# Patient Record
Sex: Male | Born: 1965 | Race: White | Hispanic: No | Marital: Married | Smoking: Never smoker
Health system: Southern US, Community
[De-identification: ages and names within clinical notes are randomized; demographics above are authoritative.]

## PROBLEM LIST (undated history)

## (undated) ENCOUNTER — Inpatient Hospital Stay: Admission: EM | Payer: Self-pay | Source: Home / Self Care

## (undated) DIAGNOSIS — R0600 Dyspnea, unspecified: Secondary | ICD-10-CM

## (undated) DIAGNOSIS — N189 Chronic kidney disease, unspecified: Secondary | ICD-10-CM

## (undated) DIAGNOSIS — R569 Unspecified convulsions: Secondary | ICD-10-CM

## (undated) DIAGNOSIS — R233 Spontaneous ecchymoses: Secondary | ICD-10-CM

## (undated) DIAGNOSIS — G43909 Migraine, unspecified, not intractable, without status migrainosus: Secondary | ICD-10-CM

## (undated) DIAGNOSIS — I82409 Acute embolism and thrombosis of unspecified deep veins of unspecified lower extremity: Secondary | ICD-10-CM

## (undated) DIAGNOSIS — Z8616 Personal history of COVID-19: Secondary | ICD-10-CM

## (undated) DIAGNOSIS — K219 Gastro-esophageal reflux disease without esophagitis: Secondary | ICD-10-CM

## (undated) DIAGNOSIS — D649 Anemia, unspecified: Secondary | ICD-10-CM

## (undated) DIAGNOSIS — J40 Bronchitis, not specified as acute or chronic: Secondary | ICD-10-CM

## (undated) DIAGNOSIS — K5900 Constipation, unspecified: Secondary | ICD-10-CM

## (undated) DIAGNOSIS — E662 Morbid (severe) obesity with alveolar hypoventilation: Secondary | ICD-10-CM

## (undated) DIAGNOSIS — G709 Myoneural disorder, unspecified: Secondary | ICD-10-CM

## (undated) DIAGNOSIS — N186 End stage renal disease: Secondary | ICD-10-CM

## (undated) DIAGNOSIS — H9190 Unspecified hearing loss, unspecified ear: Secondary | ICD-10-CM

## (undated) DIAGNOSIS — Z992 Dependence on renal dialysis: Secondary | ICD-10-CM

## (undated) DIAGNOSIS — G4733 Obstructive sleep apnea (adult) (pediatric): Secondary | ICD-10-CM

## (undated) DIAGNOSIS — M1990 Unspecified osteoarthritis, unspecified site: Secondary | ICD-10-CM

## (undated) DIAGNOSIS — I2699 Other pulmonary embolism without acute cor pulmonale: Secondary | ICD-10-CM

## (undated) DIAGNOSIS — F319 Bipolar disorder, unspecified: Secondary | ICD-10-CM

## (undated) DIAGNOSIS — F99 Mental disorder, not otherwise specified: Secondary | ICD-10-CM

## (undated) DIAGNOSIS — E1140 Type 2 diabetes mellitus with diabetic neuropathy, unspecified: Secondary | ICD-10-CM

## (undated) DIAGNOSIS — J9611 Chronic respiratory failure with hypoxia: Secondary | ICD-10-CM

## (undated) DIAGNOSIS — R238 Other skin changes: Secondary | ICD-10-CM

## (undated) DIAGNOSIS — E119 Type 2 diabetes mellitus without complications: Secondary | ICD-10-CM

## (undated) DIAGNOSIS — G894 Chronic pain syndrome: Secondary | ICD-10-CM

## (undated) DIAGNOSIS — D696 Thrombocytopenia, unspecified: Secondary | ICD-10-CM

## (undated) DIAGNOSIS — F419 Anxiety disorder, unspecified: Secondary | ICD-10-CM

## (undated) HISTORY — PX: OTHER SURGICAL HISTORY: SHX169

## (undated) HISTORY — DX: Dyspnea, unspecified: R06.00

## (undated) HISTORY — DX: Unspecified hearing loss, unspecified ear: H91.90

## (undated) HISTORY — PX: DENTAL SURGERY: SHX609

## (undated) HISTORY — PX: EYE SURGERY: SHX253

## (undated) HISTORY — PX: PATELLA FRACTURE SURGERY: SHX735

## (undated) HISTORY — PX: TONSILLECTOMY: SUR1361

## (undated) HISTORY — DX: Other pulmonary embolism without acute cor pulmonale: I26.99

## (undated) HISTORY — DX: Dependence on renal dialysis: Z99.2

## (undated) HISTORY — DX: Acute embolism and thrombosis of unspecified deep veins of unspecified lower extremity: I82.409

## (undated) HISTORY — PX: CHOLECYSTECTOMY: SHX55

---

## 2001-01-03 ENCOUNTER — Ambulatory Visit (HOSPITAL_COMMUNITY): Admission: RE | Admit: 2001-01-03 | Discharge: 2001-01-03 | Payer: Self-pay | Admitting: Internal Medicine

## 2001-01-03 ENCOUNTER — Encounter: Payer: Self-pay | Admitting: Internal Medicine

## 2001-07-13 ENCOUNTER — Encounter: Payer: Self-pay | Admitting: Neurosurgery

## 2001-07-13 ENCOUNTER — Encounter: Admission: RE | Admit: 2001-07-13 | Discharge: 2001-07-13 | Payer: Self-pay | Admitting: Neurosurgery

## 2001-09-27 ENCOUNTER — Encounter: Payer: Self-pay | Admitting: Neurosurgery

## 2001-09-27 ENCOUNTER — Encounter: Admission: RE | Admit: 2001-09-27 | Discharge: 2001-09-27 | Payer: Self-pay | Admitting: Neurosurgery

## 2001-10-02 ENCOUNTER — Emergency Department (HOSPITAL_COMMUNITY): Admission: EM | Admit: 2001-10-02 | Discharge: 2001-10-03 | Payer: Self-pay | Admitting: *Deleted

## 2001-10-11 ENCOUNTER — Encounter: Payer: Self-pay | Admitting: Neurosurgery

## 2001-10-11 ENCOUNTER — Encounter: Admission: RE | Admit: 2001-10-11 | Discharge: 2001-10-11 | Payer: Self-pay | Admitting: Neurosurgery

## 2001-10-14 ENCOUNTER — Encounter: Payer: Self-pay | Admitting: Emergency Medicine

## 2001-10-14 ENCOUNTER — Encounter: Payer: Self-pay | Admitting: Cardiology

## 2001-10-14 ENCOUNTER — Emergency Department (HOSPITAL_COMMUNITY): Admission: EM | Admit: 2001-10-14 | Discharge: 2001-10-14 | Payer: Self-pay | Admitting: Emergency Medicine

## 2004-06-02 ENCOUNTER — Emergency Department (HOSPITAL_COMMUNITY): Admission: EM | Admit: 2004-06-02 | Discharge: 2004-06-02 | Payer: Self-pay | Admitting: Emergency Medicine

## 2005-07-07 ENCOUNTER — Emergency Department (HOSPITAL_COMMUNITY): Admission: EM | Admit: 2005-07-07 | Discharge: 2005-07-07 | Payer: Self-pay | Admitting: Emergency Medicine

## 2005-07-13 ENCOUNTER — Emergency Department (HOSPITAL_COMMUNITY): Admission: EM | Admit: 2005-07-13 | Discharge: 2005-07-13 | Payer: Self-pay | Admitting: Emergency Medicine

## 2005-08-14 ENCOUNTER — Ambulatory Visit: Admission: RE | Admit: 2005-08-14 | Discharge: 2005-08-14 | Payer: Self-pay | Admitting: Family Medicine

## 2005-08-19 ENCOUNTER — Ambulatory Visit: Payer: Self-pay | Admitting: Pulmonary Disease

## 2006-03-19 ENCOUNTER — Ambulatory Visit (HOSPITAL_COMMUNITY): Admission: RE | Admit: 2006-03-19 | Discharge: 2006-03-19 | Payer: Self-pay | Admitting: Family Medicine

## 2006-03-19 ENCOUNTER — Encounter: Payer: Self-pay | Admitting: Vascular Surgery

## 2006-06-15 ENCOUNTER — Encounter: Admission: RE | Admit: 2006-06-15 | Discharge: 2006-06-15 | Payer: Self-pay | Admitting: Neurology

## 2006-06-17 ENCOUNTER — Emergency Department (HOSPITAL_COMMUNITY): Admission: EM | Admit: 2006-06-17 | Discharge: 2006-06-17 | Payer: Self-pay | Admitting: Emergency Medicine

## 2006-07-06 ENCOUNTER — Ambulatory Visit (HOSPITAL_COMMUNITY): Admission: RE | Admit: 2006-07-06 | Discharge: 2006-07-06 | Payer: Self-pay | Admitting: Family Medicine

## 2006-08-03 ENCOUNTER — Encounter: Admission: RE | Admit: 2006-08-03 | Discharge: 2006-10-06 | Payer: Self-pay | Admitting: Family Medicine

## 2006-10-06 ENCOUNTER — Ambulatory Visit: Payer: Self-pay | Admitting: Vascular Surgery

## 2006-10-06 ENCOUNTER — Ambulatory Visit (HOSPITAL_COMMUNITY): Admission: RE | Admit: 2006-10-06 | Discharge: 2006-10-06 | Payer: Self-pay | Admitting: Family Medicine

## 2006-10-06 ENCOUNTER — Encounter: Payer: Self-pay | Admitting: Vascular Surgery

## 2006-11-30 ENCOUNTER — Encounter: Admission: RE | Admit: 2006-11-30 | Discharge: 2007-02-28 | Payer: Self-pay | Admitting: Family Medicine

## 2006-12-23 ENCOUNTER — Emergency Department (HOSPITAL_COMMUNITY): Admission: EM | Admit: 2006-12-23 | Discharge: 2006-12-23 | Payer: Self-pay | Admitting: Emergency Medicine

## 2007-03-01 ENCOUNTER — Encounter: Admission: RE | Admit: 2007-03-01 | Discharge: 2007-03-01 | Payer: Self-pay | Admitting: Family Medicine

## 2007-03-18 ENCOUNTER — Ambulatory Visit (HOSPITAL_COMMUNITY): Admission: RE | Admit: 2007-03-18 | Discharge: 2007-03-18 | Payer: Self-pay | Admitting: Family Medicine

## 2007-06-13 ENCOUNTER — Emergency Department (HOSPITAL_COMMUNITY): Admission: EM | Admit: 2007-06-13 | Discharge: 2007-06-13 | Payer: Self-pay | Admitting: Emergency Medicine

## 2007-09-10 ENCOUNTER — Observation Stay (HOSPITAL_COMMUNITY): Admission: EM | Admit: 2007-09-10 | Discharge: 2007-09-16 | Payer: Self-pay | Admitting: Emergency Medicine

## 2007-09-19 ENCOUNTER — Ambulatory Visit (HOSPITAL_COMMUNITY): Admission: RE | Admit: 2007-09-19 | Discharge: 2007-09-19 | Payer: Self-pay | Admitting: Family Medicine

## 2007-09-26 ENCOUNTER — Encounter (HOSPITAL_COMMUNITY): Admission: RE | Admit: 2007-09-26 | Discharge: 2007-10-26 | Payer: Self-pay | Admitting: Family Medicine

## 2007-10-08 ENCOUNTER — Emergency Department (HOSPITAL_COMMUNITY): Admission: EM | Admit: 2007-10-08 | Discharge: 2007-10-08 | Payer: Self-pay | Admitting: Emergency Medicine

## 2008-03-30 ENCOUNTER — Ambulatory Visit (HOSPITAL_COMMUNITY): Admission: RE | Admit: 2008-03-30 | Discharge: 2008-03-30 | Payer: Self-pay | Admitting: Family Medicine

## 2008-04-25 ENCOUNTER — Ambulatory Visit (HOSPITAL_COMMUNITY): Admission: RE | Admit: 2008-04-25 | Discharge: 2008-04-25 | Payer: Self-pay | Admitting: Family Medicine

## 2008-05-17 ENCOUNTER — Ambulatory Visit (HOSPITAL_COMMUNITY): Admission: RE | Admit: 2008-05-17 | Discharge: 2008-05-17 | Payer: Self-pay | Admitting: Ophthalmology

## 2008-05-31 ENCOUNTER — Ambulatory Visit (HOSPITAL_COMMUNITY): Admission: RE | Admit: 2008-05-31 | Discharge: 2008-05-31 | Payer: Self-pay | Admitting: Ophthalmology

## 2008-06-10 ENCOUNTER — Emergency Department (HOSPITAL_COMMUNITY): Admission: EM | Admit: 2008-06-10 | Discharge: 2008-06-10 | Payer: Self-pay | Admitting: Emergency Medicine

## 2008-06-20 ENCOUNTER — Emergency Department (HOSPITAL_COMMUNITY): Admission: EM | Admit: 2008-06-20 | Discharge: 2008-06-21 | Payer: Self-pay | Admitting: Emergency Medicine

## 2008-07-04 ENCOUNTER — Inpatient Hospital Stay (HOSPITAL_COMMUNITY): Admission: EM | Admit: 2008-07-04 | Discharge: 2008-07-05 | Payer: Self-pay | Admitting: Emergency Medicine

## 2008-07-17 ENCOUNTER — Ambulatory Visit: Payer: Self-pay | Admitting: Cardiology

## 2008-07-17 ENCOUNTER — Inpatient Hospital Stay (HOSPITAL_COMMUNITY): Admission: EM | Admit: 2008-07-17 | Discharge: 2008-07-24 | Payer: Self-pay | Admitting: Emergency Medicine

## 2008-07-20 ENCOUNTER — Encounter (INDEPENDENT_AMBULATORY_CARE_PROVIDER_SITE_OTHER): Payer: Self-pay | Admitting: Pulmonary Disease

## 2008-07-23 ENCOUNTER — Encounter: Payer: Self-pay | Admitting: Cardiology

## 2008-07-30 ENCOUNTER — Inpatient Hospital Stay (HOSPITAL_COMMUNITY): Admission: EM | Admit: 2008-07-30 | Discharge: 2008-08-04 | Payer: Self-pay | Admitting: Emergency Medicine

## 2008-07-30 ENCOUNTER — Ambulatory Visit: Payer: Self-pay | Admitting: Cardiology

## 2008-08-02 ENCOUNTER — Ambulatory Visit: Payer: Self-pay | Admitting: Cardiovascular Disease

## 2008-08-02 HISTORY — PX: CARDIAC CATHETERIZATION: SHX172

## 2008-08-12 ENCOUNTER — Emergency Department (HOSPITAL_COMMUNITY): Admission: EM | Admit: 2008-08-12 | Discharge: 2008-08-12 | Payer: Self-pay | Admitting: Emergency Medicine

## 2008-08-13 ENCOUNTER — Emergency Department (HOSPITAL_COMMUNITY): Admission: EM | Admit: 2008-08-13 | Discharge: 2008-08-13 | Payer: Self-pay | Admitting: Emergency Medicine

## 2008-08-30 ENCOUNTER — Ambulatory Visit: Payer: Self-pay | Admitting: Cardiology

## 2008-11-25 ENCOUNTER — Observation Stay (HOSPITAL_COMMUNITY): Admission: EM | Admit: 2008-11-25 | Discharge: 2008-11-28 | Payer: Self-pay | Admitting: Emergency Medicine

## 2008-12-05 ENCOUNTER — Observation Stay (HOSPITAL_COMMUNITY): Admission: EM | Admit: 2008-12-05 | Discharge: 2008-12-10 | Payer: Self-pay | Admitting: Emergency Medicine

## 2008-12-05 ENCOUNTER — Ambulatory Visit: Payer: Self-pay | Admitting: Cardiology

## 2008-12-06 ENCOUNTER — Ambulatory Visit: Payer: Self-pay | Admitting: Internal Medicine

## 2008-12-07 ENCOUNTER — Encounter: Payer: Self-pay | Admitting: Internal Medicine

## 2008-12-07 ENCOUNTER — Ambulatory Visit: Payer: Self-pay | Admitting: Internal Medicine

## 2008-12-11 ENCOUNTER — Encounter: Payer: Self-pay | Admitting: Internal Medicine

## 2008-12-16 ENCOUNTER — Emergency Department (HOSPITAL_COMMUNITY): Admission: EM | Admit: 2008-12-16 | Discharge: 2008-12-16 | Payer: Self-pay | Admitting: Emergency Medicine

## 2008-12-19 ENCOUNTER — Telehealth (INDEPENDENT_AMBULATORY_CARE_PROVIDER_SITE_OTHER): Payer: Self-pay

## 2008-12-22 ENCOUNTER — Emergency Department (HOSPITAL_COMMUNITY): Admission: EM | Admit: 2008-12-22 | Discharge: 2008-12-22 | Payer: Self-pay | Admitting: Emergency Medicine

## 2008-12-24 ENCOUNTER — Encounter: Payer: Self-pay | Admitting: Internal Medicine

## 2008-12-26 ENCOUNTER — Inpatient Hospital Stay (HOSPITAL_COMMUNITY): Admission: EM | Admit: 2008-12-26 | Discharge: 2009-01-11 | Payer: Self-pay | Admitting: Emergency Medicine

## 2009-01-02 ENCOUNTER — Encounter: Admission: RE | Admit: 2009-01-02 | Discharge: 2009-01-02 | Payer: Self-pay | Admitting: Pulmonary Disease

## 2009-01-27 ENCOUNTER — Inpatient Hospital Stay (HOSPITAL_COMMUNITY): Admission: EM | Admit: 2009-01-27 | Discharge: 2009-01-30 | Payer: Self-pay | Admitting: Emergency Medicine

## 2009-01-27 ENCOUNTER — Ambulatory Visit: Payer: Self-pay | Admitting: Cardiovascular Disease

## 2009-01-28 ENCOUNTER — Encounter (INDEPENDENT_AMBULATORY_CARE_PROVIDER_SITE_OTHER): Payer: Self-pay | Admitting: Internal Medicine

## 2009-01-29 ENCOUNTER — Ambulatory Visit: Payer: Self-pay | Admitting: Physical Medicine & Rehabilitation

## 2009-01-29 ENCOUNTER — Encounter (INDEPENDENT_AMBULATORY_CARE_PROVIDER_SITE_OTHER): Payer: Self-pay | Admitting: Neurology

## 2009-02-01 ENCOUNTER — Encounter: Payer: Self-pay | Admitting: Internal Medicine

## 2009-02-05 DIAGNOSIS — R0602 Shortness of breath: Secondary | ICD-10-CM | POA: Insufficient documentation

## 2009-02-08 ENCOUNTER — Emergency Department (HOSPITAL_COMMUNITY): Admission: EM | Admit: 2009-02-08 | Discharge: 2009-02-08 | Payer: Self-pay | Admitting: Emergency Medicine

## 2009-02-28 ENCOUNTER — Ambulatory Visit: Payer: Self-pay | Admitting: Vascular Surgery

## 2009-02-28 ENCOUNTER — Emergency Department (HOSPITAL_COMMUNITY): Admission: EM | Admit: 2009-02-28 | Discharge: 2009-02-28 | Payer: Self-pay | Admitting: Emergency Medicine

## 2009-02-28 ENCOUNTER — Encounter (INDEPENDENT_AMBULATORY_CARE_PROVIDER_SITE_OTHER): Payer: Self-pay | Admitting: Emergency Medicine

## 2009-03-14 ENCOUNTER — Emergency Department (HOSPITAL_COMMUNITY): Admission: EM | Admit: 2009-03-14 | Discharge: 2009-03-14 | Payer: Self-pay | Admitting: Emergency Medicine

## 2009-03-15 ENCOUNTER — Ambulatory Visit (HOSPITAL_COMMUNITY): Admission: RE | Admit: 2009-03-15 | Discharge: 2009-03-15 | Payer: Self-pay | Admitting: Emergency Medicine

## 2009-11-26 ENCOUNTER — Ambulatory Visit: Payer: Self-pay | Admitting: Vascular Surgery

## 2009-11-26 ENCOUNTER — Ambulatory Visit: Payer: Self-pay | Admitting: Internal Medicine

## 2009-11-26 ENCOUNTER — Inpatient Hospital Stay (HOSPITAL_COMMUNITY): Admission: EM | Admit: 2009-11-26 | Discharge: 2009-11-26 | Payer: Self-pay | Admitting: Emergency Medicine

## 2009-11-26 ENCOUNTER — Encounter (INDEPENDENT_AMBULATORY_CARE_PROVIDER_SITE_OTHER): Payer: Self-pay | Admitting: Internal Medicine

## 2010-07-14 ENCOUNTER — Emergency Department (HOSPITAL_COMMUNITY)
Admission: EM | Admit: 2010-07-14 | Discharge: 2010-07-14 | Payer: Self-pay | Source: Home / Self Care | Admitting: Emergency Medicine

## 2010-07-16 LAB — DIFFERENTIAL
Basophils Absolute: 0 10*3/uL (ref 0.0–0.1)
Basophils Relative: 1 % (ref 0–1)
Eosinophils Absolute: 0.2 10*3/uL (ref 0.0–0.7)
Eosinophils Relative: 4 % (ref 0–5)
Lymphocytes Relative: 27 % (ref 12–46)
Lymphs Abs: 1.4 10*3/uL (ref 0.7–4.0)
Monocytes Absolute: 0.6 10*3/uL (ref 0.1–1.0)
Monocytes Relative: 11 % (ref 3–12)
Neutro Abs: 2.9 10*3/uL (ref 1.7–7.7)
Neutrophils Relative %: 57 % (ref 43–77)

## 2010-07-16 LAB — HEMOGLOBIN A1C
Hgb A1c MFr Bld: 6.9 % — ABNORMAL HIGH (ref <5.7)
Mean Plasma Glucose: 151 mg/dL — ABNORMAL HIGH (ref <117)

## 2010-07-16 LAB — URINALYSIS, ROUTINE W REFLEX MICROSCOPIC
Bilirubin Urine: NEGATIVE
Ketones, ur: NEGATIVE mg/dL
Leukocytes, UA: NEGATIVE
Nitrite: NEGATIVE
Protein, ur: NEGATIVE mg/dL
Specific Gravity, Urine: 1.025 (ref 1.005–1.030)
Urine Glucose, Fasting: NEGATIVE mg/dL
Urobilinogen, UA: 0.2 mg/dL (ref 0.0–1.0)
pH: 5.5 (ref 5.0–8.0)

## 2010-07-16 LAB — CBC
HCT: 36.4 % — ABNORMAL LOW (ref 39.0–52.0)
Hemoglobin: 11.9 g/dL — ABNORMAL LOW (ref 13.0–17.0)
MCH: 29.4 pg (ref 26.0–34.0)
MCHC: 32.7 g/dL (ref 30.0–36.0)
MCV: 89.9 fL (ref 78.0–100.0)
Platelets: 115 10*3/uL — ABNORMAL LOW (ref 150–400)
RBC: 4.05 MIL/uL — ABNORMAL LOW (ref 4.22–5.81)
RDW: 13.9 % (ref 11.5–15.5)
WBC: 5 10*3/uL (ref 4.0–10.5)

## 2010-07-16 LAB — PROTIME-INR
INR: 2.59 — ABNORMAL HIGH (ref 0.00–1.49)
Prothrombin Time: 27.9 seconds — ABNORMAL HIGH (ref 11.6–15.2)

## 2010-07-16 LAB — TSH: TSH: 3.949 u[IU]/mL (ref 0.350–4.500)

## 2010-07-16 LAB — URINE MICROSCOPIC-ADD ON

## 2010-07-16 LAB — BASIC METABOLIC PANEL
BUN: 14 mg/dL (ref 6–23)
CO2: 27 mEq/L (ref 19–32)
Calcium: 8.7 mg/dL (ref 8.4–10.5)
Chloride: 102 mEq/L (ref 96–112)
Creatinine, Ser: 1.21 mg/dL (ref 0.4–1.5)
GFR calc Af Amer: >60 mL/min (ref >60)
GFR calc non Af Amer: >60 mL/min (ref >60)
Glucose, Bld: 141 mg/dL — ABNORMAL HIGH (ref 70–99)
Potassium: 4.5 mEq/L (ref 3.5–5.1)
Sodium: 137 mEq/L (ref 135–145)

## 2010-07-16 LAB — T4, FREE: Free T4: 1.08 ng/dL (ref 0.80–1.80)

## 2010-07-16 LAB — D-DIMER, QUANTITATIVE: D-Dimer, Quant: 3.1 ug/mL-FEU — ABNORMAL HIGH (ref 0.00–0.48)

## 2010-07-20 ENCOUNTER — Encounter: Payer: Self-pay | Admitting: Neurology

## 2010-07-20 ENCOUNTER — Encounter: Payer: Self-pay | Admitting: Internal Medicine

## 2010-07-31 NOTE — Consult Note (Signed)
Summary: Consultation Report  Consultation Report   Imported By: Sofie Rower 12/24/2008 11:01:58  _____________________________________________________________________  External Attachment:    Type:   Image     Comment:   External Document

## 2010-07-31 NOTE — Letter (Signed)
Summary: Patient Notice, Endo Biopsy Results  [facility]  [address]   [place], [place] [address]   Phone: [phone]  Fax: [phone]       December 11, 2008   [name] [address] [place], [place]  [address] 1966/05/16    Dear Mr. Eilts,  I am pleased to inform you that the biopsies taken during your recent endoscopic examination did not show any evidence of cancer upon pathologic examination. There was evidence of mild inflammation.  Continue with the treatment plan as outlined on the day of your exam.  Please call us if you are having persistent problems or have questions about your condition that have not been fully answered at this time.  Sincerely,    R. Garfield Cornea MD  [facility] Ph: [phone]   Fax: [phone]   Appended Document: Patient Notice, Endo Biopsy Results Letter mailed to pt.

## 2010-07-31 NOTE — Letter (Signed)
Summary: disability claim  disability claim   Imported By: Zeb Comfort 02/01/2009 13:58:35  _____________________________________________________________________  External Attachment:    Type:   Image     Comment:   External Document

## 2010-09-15 LAB — CARDIAC PANEL(CRET KIN+CKTOT+MB+TROPI)
CK, MB: 0.7 ng/mL (ref 0.3–4.0)
CK, MB: 0.7 ng/mL (ref 0.3–4.0)
Relative Index: INVALID (ref 0.0–2.5)
Relative Index: INVALID (ref 0.0–2.5)
Total CK: 38 U/L (ref 7–232)
Total CK: 40 U/L (ref 7–232)
Troponin I: 0.01 ng/mL (ref 0.00–0.06)
Troponin I: 0.02 ng/mL (ref 0.00–0.06)

## 2010-09-15 LAB — CBC
HCT: 33.5 % — ABNORMAL LOW (ref 39.0–52.0)
HCT: 34.7 % — ABNORMAL LOW (ref 39.0–52.0)
Hemoglobin: 11.4 g/dL — ABNORMAL LOW (ref 13.0–17.0)
Hemoglobin: 11.8 g/dL — ABNORMAL LOW (ref 13.0–17.0)
MCHC: 34 g/dL (ref 30.0–36.0)
MCHC: 34.1 g/dL (ref 30.0–36.0)
MCV: 90.2 fL (ref 78.0–100.0)
MCV: 90.5 fL (ref 78.0–100.0)
Platelets: 141 10*3/uL — ABNORMAL LOW (ref 150–400)
Platelets: 161 10*3/uL (ref 150–400)
RBC: 3.71 MIL/uL — ABNORMAL LOW (ref 4.22–5.81)
RBC: 3.85 MIL/uL — ABNORMAL LOW (ref 4.22–5.81)
RDW: 13.2 % (ref 11.5–15.5)
RDW: 13.6 % (ref 11.5–15.5)
WBC: 6.6 10*3/uL (ref 4.0–10.5)
WBC: 7.2 10*3/uL (ref 4.0–10.5)

## 2010-09-15 LAB — COMPREHENSIVE METABOLIC PANEL
ALT: 21 U/L (ref 0–53)
AST: 26 U/L (ref 0–37)
Albumin: 2.9 g/dL — ABNORMAL LOW (ref 3.5–5.2)
Alkaline Phosphatase: 52 U/L (ref 39–117)
BUN: 22 mg/dL (ref 6–23)
CO2: 28 mEq/L (ref 19–32)
Calcium: 8.5 mg/dL (ref 8.4–10.5)
Chloride: 104 mEq/L (ref 96–112)
Creatinine, Ser: 1.04 mg/dL (ref 0.4–1.5)
GFR calc Af Amer: >60 mL/min (ref >60)
GFR calc non Af Amer: >60 mL/min (ref >60)
Glucose, Bld: 105 mg/dL — ABNORMAL HIGH (ref 70–99)
Potassium: 4.1 mEq/L (ref 3.5–5.1)
Sodium: 136 mEq/L (ref 135–145)
Total Bilirubin: 0.6 mg/dL (ref 0.3–1.2)
Total Protein: 6.6 g/dL (ref 6.0–8.3)

## 2010-09-15 LAB — DIFFERENTIAL
Basophils Absolute: 0.1 10*3/uL (ref 0.0–0.1)
Basophils Relative: 1 % (ref 0–1)
Eosinophils Absolute: 0.4 10*3/uL (ref 0.0–0.7)
Eosinophils Relative: 6 % — ABNORMAL HIGH (ref 0–5)
Lymphocytes Relative: 27 % (ref 12–46)
Lymphs Abs: 1.8 10*3/uL (ref 0.7–4.0)
Monocytes Absolute: 0.5 10*3/uL (ref 0.1–1.0)
Monocytes Relative: 8 % (ref 3–12)
Neutro Abs: 3.8 10*3/uL (ref 1.7–7.7)
Neutrophils Relative %: 58 % (ref 43–77)

## 2010-09-15 LAB — CK TOTAL AND CKMB (NOT AT ARMC)
CK, MB: 0.7 ng/mL (ref 0.3–4.0)
Relative Index: INVALID (ref 0.0–2.5)
Total CK: 35 U/L (ref 7–232)

## 2010-09-15 LAB — POCT I-STAT, CHEM 8
BUN: 26 mg/dL — ABNORMAL HIGH (ref 6–23)
Calcium, Ion: 1.04 mmol/L — ABNORMAL LOW (ref 1.12–1.32)
Chloride: 107 mEq/L (ref 96–112)
Creatinine, Ser: 1.2 mg/dL (ref 0.4–1.5)
Glucose, Bld: 107 mg/dL — ABNORMAL HIGH (ref 70–99)
HCT: 36 % — ABNORMAL LOW (ref 39.0–52.0)
Hemoglobin: 12.2 g/dL — ABNORMAL LOW (ref 13.0–17.0)
Potassium: 4.6 mEq/L (ref 3.5–5.1)
Sodium: 139 mEq/L (ref 135–145)
TCO2: 25 mmol/L (ref 0–100)

## 2010-09-15 LAB — HEMOGLOBIN A1C
Hgb A1c MFr Bld: 5.6 % (ref <5.7)
Mean Plasma Glucose: 114 mg/dL (ref <117)

## 2010-09-15 LAB — GLUCOSE, CAPILLARY
Glucose-Capillary: 100 mg/dL — ABNORMAL HIGH (ref 70–99)
Glucose-Capillary: 121 mg/dL — ABNORMAL HIGH (ref 70–99)
Glucose-Capillary: 140 mg/dL — ABNORMAL HIGH (ref 70–99)
Glucose-Capillary: 92 mg/dL (ref 70–99)

## 2010-09-15 LAB — BASIC METABOLIC PANEL
BUN: 24 mg/dL — ABNORMAL HIGH (ref 6–23)
CO2: 28 mEq/L (ref 19–32)
Calcium: 8.4 mg/dL (ref 8.4–10.5)
Chloride: 106 mEq/L (ref 96–112)
Creatinine, Ser: 1.2 mg/dL (ref 0.4–1.5)
GFR calc Af Amer: >60 mL/min (ref >60)
GFR calc non Af Amer: >60 mL/min (ref >60)
Glucose, Bld: 113 mg/dL — ABNORMAL HIGH (ref 70–99)
Potassium: 4.6 mEq/L (ref 3.5–5.1)
Sodium: 138 mEq/L (ref 135–145)

## 2010-09-15 LAB — LIPID PANEL
Cholesterol: 175 mg/dL (ref 0–200)
HDL: 48 mg/dL (ref >39)
LDL Cholesterol: 110 mg/dL — ABNORMAL HIGH (ref 0–99)
Total CHOL/HDL Ratio: 3.6 RATIO
Triglycerides: 86 mg/dL (ref <150)
VLDL: 17 mg/dL (ref 0–40)

## 2010-09-15 LAB — TROPONIN I: Troponin I: 0.01 ng/mL (ref 0.00–0.06)

## 2010-09-15 LAB — PROTIME-INR
INR: 1.07 (ref 0.00–1.49)
Prothrombin Time: 13.8 seconds (ref 11.6–15.2)

## 2010-09-15 LAB — D-DIMER, QUANTITATIVE: D-Dimer, Quant: 2.44 ug/mL-FEU — ABNORMAL HIGH (ref 0.00–0.48)

## 2010-09-15 LAB — TSH: TSH: 2.652 u[IU]/mL (ref 0.350–4.500)

## 2010-09-15 LAB — HEPARIN LEVEL (UNFRACTIONATED): Heparin Unfractionated: 0.18 IU/mL — ABNORMAL LOW (ref 0.30–0.70)

## 2010-10-03 LAB — COMPREHENSIVE METABOLIC PANEL
ALT: 38 U/L (ref 0–53)
AST: 40 U/L — ABNORMAL HIGH (ref 0–37)
Albumin: 3.1 g/dL — ABNORMAL LOW (ref 3.5–5.2)
Alkaline Phosphatase: 49 U/L (ref 39–117)
BUN: 10 mg/dL (ref 6–23)
CO2: 28 mEq/L (ref 19–32)
Calcium: 8.7 mg/dL (ref 8.4–10.5)
Chloride: 102 mEq/L (ref 96–112)
Creatinine, Ser: 1.14 mg/dL (ref 0.4–1.5)
GFR calc Af Amer: >60 mL/min (ref >60)
GFR calc non Af Amer: >60 mL/min (ref >60)
Glucose, Bld: 109 mg/dL — ABNORMAL HIGH (ref 70–99)
Potassium: 4 mEq/L (ref 3.5–5.1)
Sodium: 138 mEq/L (ref 135–145)
Total Bilirubin: 0.6 mg/dL (ref 0.3–1.2)
Total Protein: 6.5 g/dL (ref 6.0–8.3)

## 2010-10-03 LAB — CBC
HCT: 36.1 % — ABNORMAL LOW (ref 39.0–52.0)
Hemoglobin: 12.2 g/dL — ABNORMAL LOW (ref 13.0–17.0)
MCHC: 33.8 g/dL (ref 30.0–36.0)
MCV: 88.5 fL (ref 78.0–100.0)
Platelets: 155 10*3/uL (ref 150–400)
RBC: 4.08 MIL/uL — ABNORMAL LOW (ref 4.22–5.81)
RDW: 13.1 % (ref 11.5–15.5)
WBC: 5 10*3/uL (ref 4.0–10.5)

## 2010-10-03 LAB — POCT I-STAT, CHEM 8
BUN: 13 mg/dL (ref 6–23)
Calcium, Ion: 1.1 mmol/L — ABNORMAL LOW (ref 1.12–1.32)
Chloride: 101 mEq/L (ref 96–112)
Creatinine, Ser: 1 mg/dL (ref 0.4–1.5)
Glucose, Bld: 112 mg/dL — ABNORMAL HIGH (ref 70–99)
HCT: 37 % — ABNORMAL LOW (ref 39.0–52.0)
Hemoglobin: 12.6 g/dL — ABNORMAL LOW (ref 13.0–17.0)
Potassium: 4 mEq/L (ref 3.5–5.1)
Sodium: 138 mEq/L (ref 135–145)
TCO2: 28 mmol/L (ref 0–100)

## 2010-10-03 LAB — DIFFERENTIAL
Basophils Absolute: 0 10*3/uL (ref 0.0–0.1)
Basophils Relative: 0 % (ref 0–1)
Eosinophils Absolute: 0.3 10*3/uL (ref 0.0–0.7)
Eosinophils Relative: 6 % — ABNORMAL HIGH (ref 0–5)
Lymphocytes Relative: 30 % (ref 12–46)
Lymphs Abs: 1.5 10*3/uL (ref 0.7–4.0)
Monocytes Absolute: 0.7 10*3/uL (ref 0.1–1.0)
Monocytes Relative: 13 % — ABNORMAL HIGH (ref 3–12)
Neutro Abs: 2.5 10*3/uL (ref 1.7–7.7)
Neutrophils Relative %: 51 % (ref 43–77)

## 2010-10-03 LAB — PROTIME-INR
INR: 2.5 — ABNORMAL HIGH (ref 0.00–1.49)
Prothrombin Time: 26.8 seconds — ABNORMAL HIGH (ref 11.6–15.2)

## 2010-10-03 LAB — APTT: aPTT: 48 seconds — ABNORMAL HIGH (ref 24–37)

## 2010-10-04 LAB — GLUCOSE, CAPILLARY
Glucose-Capillary: 100 mg/dL — ABNORMAL HIGH (ref 70–99)
Glucose-Capillary: 103 mg/dL — ABNORMAL HIGH (ref 70–99)
Glucose-Capillary: 104 mg/dL — ABNORMAL HIGH (ref 70–99)
Glucose-Capillary: 107 mg/dL — ABNORMAL HIGH (ref 70–99)
Glucose-Capillary: 108 mg/dL — ABNORMAL HIGH (ref 70–99)
Glucose-Capillary: 109 mg/dL — ABNORMAL HIGH (ref 70–99)
Glucose-Capillary: 109 mg/dL — ABNORMAL HIGH (ref 70–99)
Glucose-Capillary: 114 mg/dL — ABNORMAL HIGH (ref 70–99)
Glucose-Capillary: 117 mg/dL — ABNORMAL HIGH (ref 70–99)
Glucose-Capillary: 96 mg/dL (ref 70–99)
Glucose-Capillary: 97 mg/dL (ref 70–99)

## 2010-10-04 LAB — PROTIME-INR
INR: 2.4 — ABNORMAL HIGH (ref 0.00–1.49)
INR: 2.6 — ABNORMAL HIGH (ref 0.00–1.49)
INR: 2.6 — ABNORMAL HIGH (ref 0.00–1.49)
INR: 2.6 — ABNORMAL HIGH (ref 0.00–1.49)
INR: 3 — ABNORMAL HIGH (ref 0.00–1.49)
Prothrombin Time: 27.7 seconds — ABNORMAL HIGH (ref 11.6–15.2)
Prothrombin Time: 27.8 seconds — ABNORMAL HIGH (ref 11.6–15.2)
Prothrombin Time: 29.4 seconds — ABNORMAL HIGH (ref 11.6–15.2)
Prothrombin Time: 29.5 seconds — ABNORMAL HIGH (ref 11.6–15.2)
Prothrombin Time: 31.2 seconds — ABNORMAL HIGH (ref 11.6–15.2)

## 2010-10-04 LAB — BASIC METABOLIC PANEL
BUN: 18 mg/dL (ref 6–23)
CO2: 29 mEq/L (ref 19–32)
Calcium: 8.6 mg/dL (ref 8.4–10.5)
Chloride: 103 mEq/L (ref 96–112)
Creatinine, Ser: 1.18 mg/dL (ref 0.4–1.5)
GFR calc Af Amer: >60 mL/min (ref >60)
GFR calc non Af Amer: >60 mL/min (ref >60)
Glucose, Bld: 115 mg/dL — ABNORMAL HIGH (ref 70–99)
Potassium: 4.2 mEq/L (ref 3.5–5.1)
Sodium: 137 mEq/L (ref 135–145)

## 2010-10-04 LAB — CBC
HCT: 33.4 % — ABNORMAL LOW (ref 39.0–52.0)
HCT: 35.5 % — ABNORMAL LOW (ref 39.0–52.0)
HCT: 36.7 % — ABNORMAL LOW (ref 39.0–52.0)
Hemoglobin: 11.5 g/dL — ABNORMAL LOW (ref 13.0–17.0)
Hemoglobin: 11.8 g/dL — ABNORMAL LOW (ref 13.0–17.0)
Hemoglobin: 12.4 g/dL — ABNORMAL LOW (ref 13.0–17.0)
MCHC: 33.3 g/dL (ref 30.0–36.0)
MCHC: 33.9 g/dL (ref 30.0–36.0)
MCHC: 34.3 g/dL (ref 30.0–36.0)
MCV: 88.7 fL (ref 78.0–100.0)
MCV: 88.8 fL (ref 78.0–100.0)
MCV: 89 fL (ref 78.0–100.0)
Platelets: 130 10*3/uL — ABNORMAL LOW (ref 150–400)
Platelets: 155 10*3/uL (ref 150–400)
Platelets: 164 10*3/uL (ref 150–400)
RBC: 3.77 MIL/uL — ABNORMAL LOW (ref 4.22–5.81)
RBC: 4 MIL/uL — ABNORMAL LOW (ref 4.22–5.81)
RBC: 4.13 MIL/uL — ABNORMAL LOW (ref 4.22–5.81)
RDW: 13.3 % (ref 11.5–15.5)
RDW: 13.4 % (ref 11.5–15.5)
RDW: 13.5 % (ref 11.5–15.5)
WBC: 4.1 10*3/uL (ref 4.0–10.5)
WBC: 4.3 10*3/uL (ref 4.0–10.5)
WBC: 5 10*3/uL (ref 4.0–10.5)

## 2010-10-04 LAB — PROTEIN S, TOTAL: Protein S Ag, Total: 63 % — ABNORMAL LOW (ref 70–140)

## 2010-10-04 LAB — CK TOTAL AND CKMB (NOT AT ARMC)
CK, MB: 0.9 ng/mL (ref 0.3–4.0)
CK, MB: 0.9 ng/mL (ref 0.3–4.0)
Relative Index: INVALID (ref 0.0–2.5)
Relative Index: INVALID (ref 0.0–2.5)
Total CK: 38 U/L (ref 7–232)
Total CK: 44 U/L (ref 7–232)

## 2010-10-04 LAB — PROTEIN C ACTIVITY: Protein C Activity: 15 % — ABNORMAL LOW (ref 75–133)

## 2010-10-04 LAB — POCT I-STAT, CHEM 8
BUN: 31 mg/dL — ABNORMAL HIGH (ref 6–23)
Calcium, Ion: 1.19 mmol/L (ref 1.12–1.32)
Chloride: 103 mEq/L (ref 96–112)
Creatinine, Ser: 1.4 mg/dL (ref 0.4–1.5)
Glucose, Bld: 98 mg/dL (ref 70–99)
HCT: 38 % — ABNORMAL LOW (ref 39.0–52.0)
Hemoglobin: 12.9 g/dL — ABNORMAL LOW (ref 13.0–17.0)
Potassium: 4.1 mEq/L (ref 3.5–5.1)
Sodium: 138 mEq/L (ref 135–145)
TCO2: 28 mmol/L (ref 0–100)

## 2010-10-04 LAB — DIFFERENTIAL
Basophils Absolute: 0 10*3/uL (ref 0.0–0.1)
Basophils Relative: 1 % (ref 0–1)
Eosinophils Absolute: 0.4 10*3/uL (ref 0.0–0.7)
Eosinophils Relative: 8 % — ABNORMAL HIGH (ref 0–5)
Lymphocytes Relative: 31 % (ref 12–46)
Lymphs Abs: 1.6 10*3/uL (ref 0.7–4.0)
Monocytes Absolute: 0.6 10*3/uL (ref 0.1–1.0)
Monocytes Relative: 12 % (ref 3–12)
Neutro Abs: 2.4 10*3/uL (ref 1.7–7.7)
Neutrophils Relative %: 48 % (ref 43–77)

## 2010-10-04 LAB — HEMOGLOBIN A1C
Hgb A1c MFr Bld: 5.8 % (ref 4.6–6.1)
Mean Plasma Glucose: 120 mg/dL

## 2010-10-04 LAB — POCT I-STAT 3, ART BLOOD GAS (G3+)
Bicarbonate: 27.1 mEq/L — ABNORMAL HIGH (ref 20.0–24.0)
O2 Saturation: 96 %
Patient temperature: 98.6
TCO2: 29 mmol/L (ref 0–100)
pCO2 arterial: 50.6 mmHg — ABNORMAL HIGH (ref 35.0–45.0)
pH, Arterial: 7.337 — ABNORMAL LOW (ref 7.350–7.450)
pO2, Arterial: 86 mmHg (ref 80.0–100.0)

## 2010-10-04 LAB — TROPONIN I
Troponin I: 0.01 ng/mL (ref 0.00–0.06)
Troponin I: 0.02 ng/mL (ref 0.00–0.06)

## 2010-10-04 LAB — POCT CARDIAC MARKERS
CKMB, poc: <1 ng/mL — ABNORMAL LOW (ref 1.0–8.0)
CKMB, poc: <1 ng/mL — ABNORMAL LOW (ref 1.0–8.0)
Myoglobin, poc: 55.5 ng/mL (ref 12–200)
Myoglobin, poc: 57.3 ng/mL (ref 12–200)
Troponin i, poc: <0.05 ng/mL (ref 0.00–0.09)
Troponin i, poc: <0.05 ng/mL (ref 0.00–0.09)

## 2010-10-04 LAB — LIPID PANEL
Cholesterol: 162 mg/dL (ref 0–200)
Cholesterol: 181 mg/dL (ref 0–200)
HDL: 43 mg/dL (ref >39)
HDL: 45 mg/dL (ref >39)
LDL Cholesterol: 115 mg/dL — ABNORMAL HIGH (ref 0–99)
LDL Cholesterol: 92 mg/dL (ref 0–99)
Total CHOL/HDL Ratio: 3.8 RATIO
Total CHOL/HDL Ratio: 4 RATIO
Triglycerides: 104 mg/dL (ref <150)
Triglycerides: 136 mg/dL (ref <150)
VLDL: 21 mg/dL (ref 0–40)
VLDL: 27 mg/dL (ref 0–40)

## 2010-10-04 LAB — COMPREHENSIVE METABOLIC PANEL
ALT: 29 U/L (ref 0–53)
AST: 23 U/L (ref 0–37)
Albumin: 2.8 g/dL — ABNORMAL LOW (ref 3.5–5.2)
Alkaline Phosphatase: 47 U/L (ref 39–117)
BUN: 24 mg/dL — ABNORMAL HIGH (ref 6–23)
CO2: 28 mEq/L (ref 19–32)
Calcium: 8.2 mg/dL — ABNORMAL LOW (ref 8.4–10.5)
Chloride: 104 mEq/L (ref 96–112)
Creatinine, Ser: 1.25 mg/dL (ref 0.4–1.5)
GFR calc Af Amer: >60 mL/min (ref >60)
GFR calc non Af Amer: >60 mL/min (ref >60)
Glucose, Bld: 113 mg/dL — ABNORMAL HIGH (ref 70–99)
Potassium: 4 mEq/L (ref 3.5–5.1)
Sodium: 135 mEq/L (ref 135–145)
Total Bilirubin: 0.4 mg/dL (ref 0.3–1.2)
Total Protein: 5.6 g/dL — ABNORMAL LOW (ref 6.0–8.3)

## 2010-10-04 LAB — PROTEIN C, TOTAL: Protein C, Total: 67 % — ABNORMAL LOW (ref 70–140)

## 2010-10-04 LAB — CARDIOLIPIN ANTIBODIES, IGG, IGM, IGA
Anticardiolipin IgA: <10 [APL'U] — ABNORMAL LOW (ref <12)
Anticardiolipin IgG: 10 [GPL'U] — ABNORMAL LOW (ref <15)
Anticardiolipin IgM: 11 [MPL'U] (ref <13)

## 2010-10-04 LAB — LUPUS ANTICOAGULANT PANEL
DRVVT: 60.8 secs — ABNORMAL HIGH (ref 36.1–47.0)
Lupus Anticoagulant: NOT DETECTED
PTT Lupus Anticoagulant: 200 secs — ABNORMAL HIGH (ref 36.3–48.8)
PTTLA 4:1 Mix: 85.3 secs — ABNORMAL HIGH (ref 36.3–48.8)
PTTLA Confirmation: 0 secs (ref <8.0)
dRVVT Incubated 1:1 Mix: 38.4 secs (ref 36.1–47.0)

## 2010-10-04 LAB — APTT
aPTT: >200 seconds (ref 24–37)
aPTT: >200 seconds (ref 24–37)

## 2010-10-04 LAB — MAGNESIUM: Magnesium: 1.8 mg/dL (ref 1.5–2.5)

## 2010-10-04 LAB — FOLATE: Folate: 10.5 ng/mL

## 2010-10-04 LAB — BETA-2-GLYCOPROTEIN I ABS, IGG/M/A
Beta-2 Glyco I IgG: <10 U/mL (ref <20)
Beta-2-Glycoprotein I IgA: <10 U/mL (ref <20)
Beta-2-Glycoprotein I IgM: <10 U/mL (ref <20)

## 2010-10-04 LAB — FACTOR 5 LEIDEN

## 2010-10-04 LAB — VITAMIN B12: Vitamin B-12: 452 pg/mL (ref 211–911)

## 2010-10-04 LAB — HOMOCYSTEINE
Homocysteine: 11.1 umol/L (ref 4.0–15.4)
Homocysteine: 13.3 umol/L (ref 4.0–15.4)

## 2010-10-04 LAB — SEDIMENTATION RATE: Sed Rate: 5 mm/hr (ref 0–16)

## 2010-10-04 LAB — TSH: TSH: 1.23 u[IU]/mL (ref 0.350–4.500)

## 2010-10-04 LAB — HEPARIN LEVEL (UNFRACTIONATED): Heparin Unfractionated: 0.32 IU/mL (ref 0.30–0.70)

## 2010-10-04 LAB — ANA: Anti Nuclear Antibody(ANA): NEGATIVE

## 2010-10-04 LAB — PROTHROMBIN GENE MUTATION

## 2010-10-04 LAB — D-DIMER, QUANTITATIVE: D-Dimer, Quant: 0.42 ug/mL-FEU (ref 0.00–0.48)

## 2010-10-04 LAB — PROTEIN S ACTIVITY: Protein S Activity: 21 % — ABNORMAL LOW (ref 69–129)

## 2010-10-04 LAB — RPR: RPR Ser Ql: NONREACTIVE

## 2010-10-04 LAB — ANTITHROMBIN III: AntiThromb III Func: 84 % (ref 76–126)

## 2010-10-05 LAB — COMPREHENSIVE METABOLIC PANEL
ALT: 22 U/L (ref 0–53)
ALT: 25 U/L (ref 0–53)
AST: 19 U/L (ref 0–37)
AST: 25 U/L (ref 0–37)
Albumin: 3 g/dL — ABNORMAL LOW (ref 3.5–5.2)
Albumin: 3 g/dL — ABNORMAL LOW (ref 3.5–5.2)
Alkaline Phosphatase: 53 U/L (ref 39–117)
Alkaline Phosphatase: 59 U/L (ref 39–117)
BUN: 17 mg/dL (ref 6–23)
BUN: 22 mg/dL (ref 6–23)
CO2: 25 mEq/L (ref 19–32)
CO2: 32 mEq/L (ref 19–32)
Calcium: 9.1 mg/dL (ref 8.4–10.5)
Calcium: 9.3 mg/dL (ref 8.4–10.5)
Chloride: 101 mEq/L (ref 96–112)
Chloride: 104 mEq/L (ref 96–112)
Creatinine, Ser: 1.23 mg/dL (ref 0.4–1.5)
Creatinine, Ser: 1.32 mg/dL (ref 0.4–1.5)
GFR calc Af Amer: >60 mL/min (ref >60)
GFR calc Af Amer: >60 mL/min (ref >60)
GFR calc non Af Amer: 59 mL/min — ABNORMAL LOW (ref >60)
GFR calc non Af Amer: >60 mL/min (ref >60)
Glucose, Bld: 124 mg/dL — ABNORMAL HIGH (ref 70–99)
Glucose, Bld: 95 mg/dL (ref 70–99)
Potassium: 4.2 mEq/L (ref 3.5–5.1)
Potassium: 4.3 mEq/L (ref 3.5–5.1)
Sodium: 137 mEq/L (ref 135–145)
Sodium: 138 mEq/L (ref 135–145)
Total Bilirubin: 0.4 mg/dL (ref 0.3–1.2)
Total Bilirubin: 0.4 mg/dL (ref 0.3–1.2)
Total Protein: 6.3 g/dL (ref 6.0–8.3)
Total Protein: 6.3 g/dL (ref 6.0–8.3)

## 2010-10-05 LAB — GLUCOSE, CAPILLARY
Glucose-Capillary: 87 mg/dL (ref 70–99)
Glucose-Capillary: 101 mg/dL — ABNORMAL HIGH (ref 70–99)
Glucose-Capillary: 102 mg/dL — ABNORMAL HIGH (ref 70–99)
Glucose-Capillary: 105 mg/dL — ABNORMAL HIGH (ref 70–99)
Glucose-Capillary: 105 mg/dL — ABNORMAL HIGH (ref 70–99)
Glucose-Capillary: 107 mg/dL — ABNORMAL HIGH (ref 70–99)
Glucose-Capillary: 108 mg/dL — ABNORMAL HIGH (ref 70–99)
Glucose-Capillary: 109 mg/dL — ABNORMAL HIGH (ref 70–99)
Glucose-Capillary: 112 mg/dL — ABNORMAL HIGH (ref 70–99)
Glucose-Capillary: 114 mg/dL — ABNORMAL HIGH (ref 70–99)
Glucose-Capillary: 115 mg/dL — ABNORMAL HIGH (ref 70–99)
Glucose-Capillary: 117 mg/dL — ABNORMAL HIGH (ref 70–99)
Glucose-Capillary: 119 mg/dL — ABNORMAL HIGH (ref 70–99)
Glucose-Capillary: 120 mg/dL — ABNORMAL HIGH (ref 70–99)
Glucose-Capillary: 122 mg/dL — ABNORMAL HIGH (ref 70–99)
Glucose-Capillary: 123 mg/dL — ABNORMAL HIGH (ref 70–99)
Glucose-Capillary: 124 mg/dL — ABNORMAL HIGH (ref 70–99)
Glucose-Capillary: 124 mg/dL — ABNORMAL HIGH (ref 70–99)
Glucose-Capillary: 125 mg/dL — ABNORMAL HIGH (ref 70–99)
Glucose-Capillary: 128 mg/dL — ABNORMAL HIGH (ref 70–99)
Glucose-Capillary: 129 mg/dL — ABNORMAL HIGH (ref 70–99)
Glucose-Capillary: 129 mg/dL — ABNORMAL HIGH (ref 70–99)
Glucose-Capillary: 130 mg/dL — ABNORMAL HIGH (ref 70–99)
Glucose-Capillary: 131 mg/dL — ABNORMAL HIGH (ref 70–99)
Glucose-Capillary: 131 mg/dL — ABNORMAL HIGH (ref 70–99)
Glucose-Capillary: 135 mg/dL — ABNORMAL HIGH (ref 70–99)
Glucose-Capillary: 135 mg/dL — ABNORMAL HIGH (ref 70–99)
Glucose-Capillary: 136 mg/dL — ABNORMAL HIGH (ref 70–99)
Glucose-Capillary: 137 mg/dL — ABNORMAL HIGH (ref 70–99)
Glucose-Capillary: 138 mg/dL — ABNORMAL HIGH (ref 70–99)
Glucose-Capillary: 139 mg/dL — ABNORMAL HIGH (ref 70–99)
Glucose-Capillary: 144 mg/dL — ABNORMAL HIGH (ref 70–99)
Glucose-Capillary: 146 mg/dL — ABNORMAL HIGH (ref 70–99)
Glucose-Capillary: 147 mg/dL — ABNORMAL HIGH (ref 70–99)
Glucose-Capillary: 148 mg/dL — ABNORMAL HIGH (ref 70–99)
Glucose-Capillary: 150 mg/dL — ABNORMAL HIGH (ref 70–99)
Glucose-Capillary: 153 mg/dL — ABNORMAL HIGH (ref 70–99)
Glucose-Capillary: 153 mg/dL — ABNORMAL HIGH (ref 70–99)
Glucose-Capillary: 153 mg/dL — ABNORMAL HIGH (ref 70–99)
Glucose-Capillary: 157 mg/dL — ABNORMAL HIGH (ref 70–99)
Glucose-Capillary: 160 mg/dL — ABNORMAL HIGH (ref 70–99)
Glucose-Capillary: 165 mg/dL — ABNORMAL HIGH (ref 70–99)
Glucose-Capillary: 167 mg/dL — ABNORMAL HIGH (ref 70–99)
Glucose-Capillary: 187 mg/dL — ABNORMAL HIGH (ref 70–99)
Glucose-Capillary: 214 mg/dL — ABNORMAL HIGH (ref 70–99)
Glucose-Capillary: 87 mg/dL (ref 70–99)
Glucose-Capillary: 88 mg/dL (ref 70–99)
Glucose-Capillary: 88 mg/dL (ref 70–99)
Glucose-Capillary: 90 mg/dL (ref 70–99)
Glucose-Capillary: 91 mg/dL (ref 70–99)
Glucose-Capillary: 92 mg/dL (ref 70–99)
Glucose-Capillary: 93 mg/dL (ref 70–99)
Glucose-Capillary: 93 mg/dL (ref 70–99)
Glucose-Capillary: 94 mg/dL (ref 70–99)
Glucose-Capillary: 96 mg/dL (ref 70–99)
Glucose-Capillary: 97 mg/dL (ref 70–99)
Glucose-Capillary: 97 mg/dL (ref 70–99)
Glucose-Capillary: 98 mg/dL (ref 70–99)
Glucose-Capillary: 99 mg/dL (ref 70–99)
Glucose-Capillary: 99 mg/dL (ref 70–99)

## 2010-10-05 LAB — DIFFERENTIAL
Basophils Absolute: 0.1 10*3/uL (ref 0.0–0.1)
Basophils Absolute: 0.1 10*3/uL (ref 0.0–0.1)
Basophils Absolute: 0.1 10*3/uL (ref 0.0–0.1)
Basophils Relative: 1 % (ref 0–1)
Basophils Relative: 1 % (ref 0–1)
Basophils Relative: 1 % (ref 0–1)
Eosinophils Absolute: 0.2 10*3/uL (ref 0.0–0.7)
Eosinophils Absolute: 0.3 10*3/uL (ref 0.0–0.7)
Eosinophils Absolute: 0.4 10*3/uL (ref 0.0–0.7)
Eosinophils Relative: 3 % (ref 0–5)
Eosinophils Relative: 3 % (ref 0–5)
Eosinophils Relative: 6 % — ABNORMAL HIGH (ref 0–5)
Lymphocytes Relative: 22 % (ref 12–46)
Lymphocytes Relative: 31 % (ref 12–46)
Lymphocytes Relative: 31 % (ref 12–46)
Lymphs Abs: 1.5 10*3/uL (ref 0.7–4.0)
Lymphs Abs: 2.2 10*3/uL (ref 0.7–4.0)
Lymphs Abs: 2.7 10*3/uL (ref 0.7–4.0)
Monocytes Absolute: 0.6 10*3/uL (ref 0.1–1.0)
Monocytes Absolute: 0.8 10*3/uL (ref 0.1–1.0)
Monocytes Absolute: 0.9 10*3/uL (ref 0.1–1.0)
Monocytes Relative: 14 % — ABNORMAL HIGH (ref 3–12)
Monocytes Relative: 9 % (ref 3–12)
Monocytes Relative: 9 % (ref 3–12)
Neutro Abs: 3.8 10*3/uL (ref 1.7–7.7)
Neutro Abs: 3.8 10*3/uL (ref 1.7–7.7)
Neutro Abs: 4.7 10*3/uL (ref 1.7–7.7)
Neutrophils Relative %: 56 % (ref 43–77)
Neutrophils Relative %: 56 % (ref 43–77)
Neutrophils Relative %: 57 % (ref 43–77)

## 2010-10-05 LAB — CBC
HCT: 36.7 % — ABNORMAL LOW (ref 39.0–52.0)
HCT: 37.3 % — ABNORMAL LOW (ref 39.0–52.0)
HCT: 39.1 % (ref 39.0–52.0)
Hemoglobin: 12.6 g/dL — ABNORMAL LOW (ref 13.0–17.0)
Hemoglobin: 12.8 g/dL — ABNORMAL LOW (ref 13.0–17.0)
Hemoglobin: 13.6 g/dL (ref 13.0–17.0)
MCHC: 34.2 g/dL (ref 30.0–36.0)
MCHC: 34.5 g/dL (ref 30.0–36.0)
MCHC: 34.7 g/dL (ref 30.0–36.0)
MCV: 88.6 fL (ref 78.0–100.0)
MCV: 88.9 fL (ref 78.0–100.0)
MCV: 89.5 fL (ref 78.0–100.0)
Platelets: 191 10*3/uL (ref 150–400)
Platelets: 211 10*3/uL (ref 150–400)
Platelets: 237 10*3/uL (ref 150–400)
RBC: 4.14 MIL/uL — ABNORMAL LOW (ref 4.22–5.81)
RBC: 4.18 MIL/uL — ABNORMAL LOW (ref 4.22–5.81)
RBC: 4.4 MIL/uL (ref 4.22–5.81)
RDW: 13.3 % (ref 11.5–15.5)
RDW: 13.5 % (ref 11.5–15.5)
RDW: 13.5 % (ref 11.5–15.5)
WBC: 6.7 10*3/uL (ref 4.0–10.5)
WBC: 6.9 10*3/uL (ref 4.0–10.5)
WBC: 8.5 10*3/uL (ref 4.0–10.5)

## 2010-10-05 LAB — CLOSTRIDIUM DIFFICILE EIA: C difficile Toxins A+B, EIA: NEGATIVE

## 2010-10-05 LAB — STOOL CULTURE

## 2010-10-05 LAB — BASIC METABOLIC PANEL
BUN: 10 mg/dL (ref 6–23)
CO2: 25 mEq/L (ref 19–32)
Calcium: 8.6 mg/dL (ref 8.4–10.5)
Chloride: 107 mEq/L (ref 96–112)
Creatinine, Ser: 1.26 mg/dL (ref 0.4–1.5)
GFR calc Af Amer: >60 mL/min (ref >60)
GFR calc non Af Amer: >60 mL/min (ref >60)
Glucose, Bld: 115 mg/dL — ABNORMAL HIGH (ref 70–99)
Potassium: 4 mEq/L (ref 3.5–5.1)
Sodium: 137 mEq/L (ref 135–145)

## 2010-10-05 LAB — OVA AND PARASITE EXAMINATION: Ova and parasites: NONE SEEN

## 2010-10-05 LAB — AMYLASE: Amylase: 60 U/L (ref 27–131)

## 2010-10-06 LAB — DIFFERENTIAL
Basophils Absolute: 0 10*3/uL (ref 0.0–0.1)
Basophils Absolute: 0.1 10*3/uL (ref 0.0–0.1)
Basophils Absolute: 0.1 10*3/uL (ref 0.0–0.1)
Basophils Relative: 1 % (ref 0–1)
Basophils Relative: 1 % (ref 0–1)
Basophils Relative: 1 % (ref 0–1)
Eosinophils Absolute: 0.3 10*3/uL (ref 0.0–0.7)
Eosinophils Absolute: 0.5 10*3/uL (ref 0.0–0.7)
Eosinophils Absolute: 0.1 10*3/uL (ref 0.0–0.7)
Eosinophils Relative: 5 % (ref 0–5)
Eosinophils Relative: 6 % — ABNORMAL HIGH (ref 0–5)
Eosinophils Relative: 2 % (ref 0–5)
Lymphocytes Relative: 23 % (ref 12–46)
Lymphocytes Relative: 27 % (ref 12–46)
Lymphocytes Relative: 17 % (ref 12–46)
Lymphs Abs: 1.4 10*3/uL (ref 0.7–4.0)
Lymphs Abs: 1.7 10*3/uL (ref 0.7–4.0)
Lymphs Abs: 1.3 10*3/uL (ref 0.7–4.0)
Monocytes Absolute: 0.7 10*3/uL (ref 0.1–1.0)
Monocytes Absolute: 0.8 10*3/uL (ref 0.1–1.0)
Monocytes Absolute: 0.7 10*3/uL (ref 0.1–1.0)
Monocytes Relative: 11 % (ref 3–12)
Monocytes Relative: 13 % — ABNORMAL HIGH (ref 3–12)
Monocytes Relative: 9 % (ref 3–12)
Neutro Abs: 2.8 10*3/uL (ref 1.7–7.7)
Neutro Abs: 4.3 10*3/uL (ref 1.7–7.7)
Neutro Abs: 5.5 10*3/uL (ref 1.7–7.7)
Neutrophils Relative %: 54 % (ref 43–77)
Neutrophils Relative %: 59 % (ref 43–77)
Neutrophils Relative %: 71 % (ref 43–77)

## 2010-10-06 LAB — POCT CARDIAC MARKERS
CKMB, poc: <1 ng/mL — ABNORMAL LOW (ref 1.0–8.0)
CKMB, poc: <1 ng/mL — ABNORMAL LOW (ref 1.0–8.0)
Myoglobin, poc: 127 ng/mL (ref 12–200)
Myoglobin, poc: 144 ng/mL (ref 12–200)
Troponin i, poc: <0.05 ng/mL (ref 0.00–0.09)
Troponin i, poc: <0.05 ng/mL (ref 0.00–0.09)

## 2010-10-06 LAB — BLOOD GAS, ARTERIAL
Acid-base deficit: 2.1 mmol/L — ABNORMAL HIGH (ref 0.0–2.0)
Bicarbonate: 22.9 mEq/L (ref 20.0–24.0)
O2 Content: 3 L/min
O2 Saturation: 97.4 %
Patient temperature: 37
TCO2: 20.7 mmol/L (ref 0–100)
pCO2 arterial: 44.9 mmHg (ref 35.0–45.0)
pH, Arterial: 7.328 — ABNORMAL LOW (ref 7.350–7.450)
pO2, Arterial: 105 mmHg — ABNORMAL HIGH (ref 80.0–100.0)

## 2010-10-06 LAB — BASIC METABOLIC PANEL
BUN: 11 mg/dL (ref 6–23)
BUN: 21 mg/dL (ref 6–23)
BUN: 27 mg/dL — ABNORMAL HIGH (ref 6–23)
BUN: 36 mg/dL — ABNORMAL HIGH (ref 6–23)
BUN: 47 mg/dL — ABNORMAL HIGH (ref 6–23)
BUN: 50 mg/dL — ABNORMAL HIGH (ref 6–23)
BUN: 50 mg/dL — ABNORMAL HIGH (ref 6–23)
BUN: 58 mg/dL — ABNORMAL HIGH (ref 6–23)
BUN: 61 mg/dL — ABNORMAL HIGH (ref 6–23)
BUN: 66 mg/dL — ABNORMAL HIGH (ref 6–23)
BUN: 69 mg/dL — ABNORMAL HIGH (ref 6–23)
CO2: 27 mEq/L (ref 19–32)
CO2: 28 mEq/L (ref 19–32)
CO2: 32 mEq/L (ref 19–32)
CO2: 32 mEq/L (ref 19–32)
CO2: 32 mEq/L (ref 19–32)
CO2: 33 mEq/L — ABNORMAL HIGH (ref 19–32)
CO2: 34 mEq/L — ABNORMAL HIGH (ref 19–32)
CO2: 34 mEq/L — ABNORMAL HIGH (ref 19–32)
CO2: 34 mEq/L — ABNORMAL HIGH (ref 19–32)
CO2: 34 mEq/L — ABNORMAL HIGH (ref 19–32)
CO2: 37 mEq/L — ABNORMAL HIGH (ref 19–32)
Calcium: 8.2 mg/dL — ABNORMAL LOW (ref 8.4–10.5)
Calcium: 8.7 mg/dL (ref 8.4–10.5)
Calcium: 8.5 mg/dL (ref 8.4–10.5)
Calcium: 8.6 mg/dL (ref 8.4–10.5)
Calcium: 8.7 mg/dL (ref 8.4–10.5)
Calcium: 8.7 mg/dL (ref 8.4–10.5)
Calcium: 8.8 mg/dL (ref 8.4–10.5)
Calcium: 8.9 mg/dL (ref 8.4–10.5)
Calcium: 8.9 mg/dL (ref 8.4–10.5)
Calcium: 8.9 mg/dL (ref 8.4–10.5)
Calcium: 9.2 mg/dL (ref 8.4–10.5)
Chloride: 106 mEq/L (ref 96–112)
Chloride: 108 mEq/L (ref 96–112)
Chloride: 100 mEq/L (ref 96–112)
Chloride: 101 mEq/L (ref 96–112)
Chloride: 91 mEq/L — ABNORMAL LOW (ref 96–112)
Chloride: 95 mEq/L — ABNORMAL LOW (ref 96–112)
Chloride: 96 mEq/L (ref 96–112)
Chloride: 97 mEq/L (ref 96–112)
Chloride: 98 mEq/L (ref 96–112)
Chloride: 98 mEq/L (ref 96–112)
Chloride: 98 mEq/L (ref 96–112)
Creatinine, Ser: 1.18 mg/dL (ref 0.4–1.5)
Creatinine, Ser: 1.25 mg/dL (ref 0.4–1.5)
Creatinine, Ser: 1.37 mg/dL (ref 0.4–1.5)
Creatinine, Ser: 1.38 mg/dL (ref 0.4–1.5)
Creatinine, Ser: 1.73 mg/dL — ABNORMAL HIGH (ref 0.4–1.5)
Creatinine, Ser: 1.94 mg/dL — ABNORMAL HIGH (ref 0.4–1.5)
Creatinine, Ser: 2.14 mg/dL — ABNORMAL HIGH (ref 0.4–1.5)
Creatinine, Ser: 2.26 mg/dL — ABNORMAL HIGH (ref 0.4–1.5)
Creatinine, Ser: 2.75 mg/dL — ABNORMAL HIGH (ref 0.4–1.5)
Creatinine, Ser: 2.8 mg/dL — ABNORMAL HIGH (ref 0.4–1.5)
Creatinine, Ser: 3.56 mg/dL — ABNORMAL HIGH (ref 0.4–1.5)
GFR calc Af Amer: >60 mL/min (ref >60)
GFR calc Af Amer: >60 mL/min (ref >60)
GFR calc Af Amer: 23 mL/min — ABNORMAL LOW (ref >60)
GFR calc Af Amer: 30 mL/min — ABNORMAL LOW (ref >60)
GFR calc Af Amer: 31 mL/min — ABNORMAL LOW (ref >60)
GFR calc Af Amer: 39 mL/min — ABNORMAL LOW (ref >60)
GFR calc Af Amer: 41 mL/min — ABNORMAL LOW (ref >60)
GFR calc Af Amer: 46 mL/min — ABNORMAL LOW (ref >60)
GFR calc Af Amer: 53 mL/min — ABNORMAL LOW (ref >60)
GFR calc Af Amer: >60 mL/min (ref >60)
GFR calc Af Amer: >60 mL/min (ref >60)
GFR calc non Af Amer: >60 mL/min (ref >60)
GFR calc non Af Amer: >60 mL/min (ref >60)
GFR calc non Af Amer: 19 mL/min — ABNORMAL LOW (ref >60)
GFR calc non Af Amer: 25 mL/min — ABNORMAL LOW (ref >60)
GFR calc non Af Amer: 26 mL/min — ABNORMAL LOW (ref >60)
GFR calc non Af Amer: 32 mL/min — ABNORMAL LOW (ref >60)
GFR calc non Af Amer: 34 mL/min — ABNORMAL LOW (ref >60)
GFR calc non Af Amer: 38 mL/min — ABNORMAL LOW (ref >60)
GFR calc non Af Amer: 44 mL/min — ABNORMAL LOW (ref >60)
GFR calc non Af Amer: 57 mL/min — ABNORMAL LOW (ref >60)
GFR calc non Af Amer: 57 mL/min — ABNORMAL LOW (ref >60)
Glucose, Bld: 108 mg/dL — ABNORMAL HIGH (ref 70–99)
Glucose, Bld: 134 mg/dL — ABNORMAL HIGH (ref 70–99)
Glucose, Bld: 103 mg/dL — ABNORMAL HIGH (ref 70–99)
Glucose, Bld: 107 mg/dL — ABNORMAL HIGH (ref 70–99)
Glucose, Bld: 112 mg/dL — ABNORMAL HIGH (ref 70–99)
Glucose, Bld: 120 mg/dL — ABNORMAL HIGH (ref 70–99)
Glucose, Bld: 125 mg/dL — ABNORMAL HIGH (ref 70–99)
Glucose, Bld: 129 mg/dL — ABNORMAL HIGH (ref 70–99)
Glucose, Bld: 134 mg/dL — ABNORMAL HIGH (ref 70–99)
Glucose, Bld: 140 mg/dL — ABNORMAL HIGH (ref 70–99)
Glucose, Bld: 146 mg/dL — ABNORMAL HIGH (ref 70–99)
Potassium: 3.9 mEq/L (ref 3.5–5.1)
Potassium: 4.1 mEq/L (ref 3.5–5.1)
Potassium: 2.8 mEq/L — ABNORMAL LOW (ref 3.5–5.1)
Potassium: 2.9 mEq/L — ABNORMAL LOW (ref 3.5–5.1)
Potassium: 3.2 mEq/L — ABNORMAL LOW (ref 3.5–5.1)
Potassium: 3.3 mEq/L — ABNORMAL LOW (ref 3.5–5.1)
Potassium: 3.3 mEq/L — ABNORMAL LOW (ref 3.5–5.1)
Potassium: 3.4 mEq/L — ABNORMAL LOW (ref 3.5–5.1)
Potassium: 3.7 mEq/L (ref 3.5–5.1)
Potassium: 3.8 mEq/L (ref 3.5–5.1)
Potassium: 4.5 mEq/L (ref 3.5–5.1)
Sodium: 136 mEq/L (ref 135–145)
Sodium: 139 mEq/L (ref 135–145)
Sodium: 135 mEq/L (ref 135–145)
Sodium: 136 mEq/L (ref 135–145)
Sodium: 136 mEq/L (ref 135–145)
Sodium: 137 mEq/L (ref 135–145)
Sodium: 138 mEq/L (ref 135–145)
Sodium: 139 mEq/L (ref 135–145)
Sodium: 139 mEq/L (ref 135–145)
Sodium: 139 mEq/L (ref 135–145)
Sodium: 140 mEq/L (ref 135–145)

## 2010-10-06 LAB — GLUCOSE, CAPILLARY
Glucose-Capillary: 102 mg/dL — ABNORMAL HIGH (ref 70–99)
Glucose-Capillary: 110 mg/dL — ABNORMAL HIGH (ref 70–99)
Glucose-Capillary: 94 mg/dL (ref 70–99)
Glucose-Capillary: 97 mg/dL (ref 70–99)
Glucose-Capillary: 100 mg/dL — ABNORMAL HIGH (ref 70–99)
Glucose-Capillary: 100 mg/dL — ABNORMAL HIGH (ref 70–99)
Glucose-Capillary: 101 mg/dL — ABNORMAL HIGH (ref 70–99)
Glucose-Capillary: 103 mg/dL — ABNORMAL HIGH (ref 70–99)
Glucose-Capillary: 108 mg/dL — ABNORMAL HIGH (ref 70–99)
Glucose-Capillary: 114 mg/dL — ABNORMAL HIGH (ref 70–99)
Glucose-Capillary: 115 mg/dL — ABNORMAL HIGH (ref 70–99)
Glucose-Capillary: 118 mg/dL — ABNORMAL HIGH (ref 70–99)
Glucose-Capillary: 119 mg/dL — ABNORMAL HIGH (ref 70–99)
Glucose-Capillary: 123 mg/dL — ABNORMAL HIGH (ref 70–99)
Glucose-Capillary: 124 mg/dL — ABNORMAL HIGH (ref 70–99)
Glucose-Capillary: 126 mg/dL — ABNORMAL HIGH (ref 70–99)
Glucose-Capillary: 126 mg/dL — ABNORMAL HIGH (ref 70–99)
Glucose-Capillary: 128 mg/dL — ABNORMAL HIGH (ref 70–99)
Glucose-Capillary: 133 mg/dL — ABNORMAL HIGH (ref 70–99)
Glucose-Capillary: 133 mg/dL — ABNORMAL HIGH (ref 70–99)
Glucose-Capillary: 135 mg/dL — ABNORMAL HIGH (ref 70–99)
Glucose-Capillary: 141 mg/dL — ABNORMAL HIGH (ref 70–99)
Glucose-Capillary: 142 mg/dL — ABNORMAL HIGH (ref 70–99)
Glucose-Capillary: 143 mg/dL — ABNORMAL HIGH (ref 70–99)
Glucose-Capillary: 144 mg/dL — ABNORMAL HIGH (ref 70–99)
Glucose-Capillary: 153 mg/dL — ABNORMAL HIGH (ref 70–99)
Glucose-Capillary: 153 mg/dL — ABNORMAL HIGH (ref 70–99)
Glucose-Capillary: 154 mg/dL — ABNORMAL HIGH (ref 70–99)
Glucose-Capillary: 156 mg/dL — ABNORMAL HIGH (ref 70–99)
Glucose-Capillary: 166 mg/dL — ABNORMAL HIGH (ref 70–99)
Glucose-Capillary: 167 mg/dL — ABNORMAL HIGH (ref 70–99)
Glucose-Capillary: 96 mg/dL (ref 70–99)

## 2010-10-06 LAB — HEPATIC FUNCTION PANEL
ALT: 32 U/L (ref 0–53)
AST: 23 U/L (ref 0–37)
Albumin: 3.3 g/dL — ABNORMAL LOW (ref 3.5–5.2)
Alkaline Phosphatase: 59 U/L (ref 39–117)
Bilirubin, Direct: 0.1 mg/dL (ref 0.0–0.3)
Indirect Bilirubin: 0.5 mg/dL (ref 0.3–0.9)
Total Bilirubin: 0.6 mg/dL (ref 0.3–1.2)
Total Protein: 6.9 g/dL (ref 6.0–8.3)

## 2010-10-06 LAB — CBC
HCT: 32.2 % — ABNORMAL LOW (ref 39.0–52.0)
HCT: 39.1 % (ref 39.0–52.0)
HCT: 38 % — ABNORMAL LOW (ref 39.0–52.0)
Hemoglobin: 11.4 g/dL — ABNORMAL LOW (ref 13.0–17.0)
Hemoglobin: 13.6 g/dL (ref 13.0–17.0)
Hemoglobin: 13.2 g/dL (ref 13.0–17.0)
MCHC: 34.7 g/dL (ref 30.0–36.0)
MCHC: 35.5 g/dL (ref 30.0–36.0)
MCHC: 34.6 g/dL (ref 30.0–36.0)
MCV: 87.3 fL (ref 78.0–100.0)
MCV: 89.7 fL (ref 78.0–100.0)
MCV: 88.5 fL (ref 78.0–100.0)
Platelets: 157 10*3/uL (ref 150–400)
Platelets: 212 10*3/uL (ref 150–400)
Platelets: 211 10*3/uL (ref 150–400)
RBC: 3.69 MIL/uL — ABNORMAL LOW (ref 4.22–5.81)
RBC: 4.36 MIL/uL (ref 4.22–5.81)
RBC: 4.3 MIL/uL (ref 4.22–5.81)
RDW: 13.2 % (ref 11.5–15.5)
RDW: 13.7 % (ref 11.5–15.5)
RDW: 13.2 % (ref 11.5–15.5)
WBC: 5.2 10*3/uL (ref 4.0–10.5)
WBC: 7.4 10*3/uL (ref 4.0–10.5)
WBC: 7.7 10*3/uL (ref 4.0–10.5)

## 2010-10-06 LAB — POCT I-STAT, CHEM 8
BUN: 10 mg/dL (ref 6–23)
Calcium, Ion: 1.16 mmol/L (ref 1.12–1.32)
Chloride: 107 mEq/L (ref 96–112)
Creatinine, Ser: 1.2 mg/dL (ref 0.4–1.5)
Glucose, Bld: 94 mg/dL (ref 70–99)
HCT: 38 % — ABNORMAL LOW (ref 39.0–52.0)
Hemoglobin: 12.9 g/dL — ABNORMAL LOW (ref 13.0–17.0)
Potassium: 4.1 mEq/L (ref 3.5–5.1)
Sodium: 141 mEq/L (ref 135–145)
TCO2: 23 mmol/L (ref 0–100)

## 2010-10-06 LAB — D-DIMER, QUANTITATIVE
D-Dimer, Quant: 0.39 ug/mL-FEU (ref 0.00–0.48)
D-Dimer, Quant: 0.4 ug/mL-FEU (ref 0.00–0.48)

## 2010-10-06 LAB — LIPASE, BLOOD: Lipase: 35 U/L (ref 11–59)

## 2010-10-06 LAB — POTASSIUM
Potassium: 2.8 mEq/L — ABNORMAL LOW (ref 3.5–5.1)
Potassium: 2.9 mEq/L — ABNORMAL LOW (ref 3.5–5.1)

## 2010-10-06 LAB — BRAIN NATRIURETIC PEPTIDE: Pro B Natriuretic peptide (BNP): 103 pg/mL — ABNORMAL HIGH (ref 0.0–100.0)

## 2010-10-07 LAB — BASIC METABOLIC PANEL
BUN: 53 mg/dL — ABNORMAL HIGH (ref 6–23)
BUN: 61 mg/dL — ABNORMAL HIGH (ref 6–23)
BUN: 71 mg/dL — ABNORMAL HIGH (ref 6–23)
BUN: 78 mg/dL — ABNORMAL HIGH (ref 6–23)
CO2: 36 mEq/L — ABNORMAL HIGH (ref 19–32)
CO2: 36 mEq/L — ABNORMAL HIGH (ref 19–32)
CO2: 37 mEq/L — ABNORMAL HIGH (ref 19–32)
CO2: 37 mEq/L — ABNORMAL HIGH (ref 19–32)
Calcium: 9.1 mg/dL (ref 8.4–10.5)
Calcium: 9.1 mg/dL (ref 8.4–10.5)
Calcium: 9.3 mg/dL (ref 8.4–10.5)
Calcium: 9.5 mg/dL (ref 8.4–10.5)
Chloride: 88 mEq/L — ABNORMAL LOW (ref 96–112)
Chloride: 89 mEq/L — ABNORMAL LOW (ref 96–112)
Chloride: 91 mEq/L — ABNORMAL LOW (ref 96–112)
Chloride: 92 mEq/L — ABNORMAL LOW (ref 96–112)
Creatinine, Ser: 1.53 mg/dL — ABNORMAL HIGH (ref 0.4–1.5)
Creatinine, Ser: 1.55 mg/dL — ABNORMAL HIGH (ref 0.4–1.5)
Creatinine, Ser: 1.83 mg/dL — ABNORMAL HIGH (ref 0.4–1.5)
Creatinine, Ser: 2.05 mg/dL — ABNORMAL HIGH (ref 0.4–1.5)
GFR calc Af Amer: 43 mL/min — ABNORMAL LOW (ref >60)
GFR calc Af Amer: 49 mL/min — ABNORMAL LOW (ref >60)
GFR calc Af Amer: 60 mL/min — ABNORMAL LOW (ref >60)
GFR calc Af Amer: >60 mL/min (ref >60)
GFR calc non Af Amer: 36 mL/min — ABNORMAL LOW (ref >60)
GFR calc non Af Amer: 41 mL/min — ABNORMAL LOW (ref >60)
GFR calc non Af Amer: 49 mL/min — ABNORMAL LOW (ref >60)
GFR calc non Af Amer: 50 mL/min — ABNORMAL LOW (ref >60)
Glucose, Bld: 109 mg/dL — ABNORMAL HIGH (ref 70–99)
Glucose, Bld: 117 mg/dL — ABNORMAL HIGH (ref 70–99)
Glucose, Bld: 146 mg/dL — ABNORMAL HIGH (ref 70–99)
Glucose, Bld: 149 mg/dL — ABNORMAL HIGH (ref 70–99)
Potassium: 2.6 mEq/L — CL (ref 3.5–5.1)
Potassium: 2.7 mEq/L — CL (ref 3.5–5.1)
Potassium: 2.8 mEq/L — ABNORMAL LOW (ref 3.5–5.1)
Potassium: 2.8 mEq/L — ABNORMAL LOW (ref 3.5–5.1)
Sodium: 134 mEq/L — ABNORMAL LOW (ref 135–145)
Sodium: 135 mEq/L (ref 135–145)
Sodium: 135 mEq/L (ref 135–145)
Sodium: 136 mEq/L (ref 135–145)

## 2010-10-07 LAB — CARDIAC PANEL(CRET KIN+CKTOT+MB+TROPI)
CK, MB: 1.8 ng/mL (ref 0.3–4.0)
CK, MB: 2 ng/mL (ref 0.3–4.0)
CK, MB: 2 ng/mL (ref 0.3–4.0)
Relative Index: 1.3 (ref 0.0–2.5)
Relative Index: 1.3 (ref 0.0–2.5)
Relative Index: 1.3 (ref 0.0–2.5)
Total CK: 138 U/L (ref 7–232)
Total CK: 151 U/L (ref 7–232)
Total CK: 154 U/L (ref 7–232)
Troponin I: 0.03 ng/mL (ref 0.00–0.06)
Troponin I: 0.03 ng/mL (ref 0.00–0.06)
Troponin I: 0.03 ng/mL (ref 0.00–0.06)

## 2010-10-07 LAB — DIFFERENTIAL
Basophils Absolute: 0.1 10*3/uL (ref 0.0–0.1)
Basophils Relative: 1 % (ref 0–1)
Eosinophils Absolute: 0.2 10*3/uL (ref 0.0–0.7)
Eosinophils Relative: 2 % (ref 0–5)
Lymphocytes Relative: 28 % (ref 12–46)
Lymphs Abs: 2.8 10*3/uL (ref 0.7–4.0)
Monocytes Absolute: 1.1 10*3/uL — ABNORMAL HIGH (ref 0.1–1.0)
Monocytes Relative: 12 % (ref 3–12)
Neutro Abs: 5.6 10*3/uL (ref 1.7–7.7)
Neutrophils Relative %: 57 % (ref 43–77)

## 2010-10-07 LAB — CBC
HCT: 40.5 % (ref 39.0–52.0)
Hemoglobin: 14.2 g/dL (ref 13.0–17.0)
MCHC: 35.1 g/dL (ref 30.0–36.0)
MCV: 87.9 fL (ref 78.0–100.0)
Platelets: 243 10*3/uL (ref 150–400)
RBC: 4.61 MIL/uL (ref 4.22–5.81)
RDW: 13.4 % (ref 11.5–15.5)
WBC: 9.8 10*3/uL (ref 4.0–10.5)

## 2010-10-07 LAB — GLUCOSE, CAPILLARY
Glucose-Capillary: 107 mg/dL — ABNORMAL HIGH (ref 70–99)
Glucose-Capillary: 127 mg/dL — ABNORMAL HIGH (ref 70–99)
Glucose-Capillary: 137 mg/dL — ABNORMAL HIGH (ref 70–99)
Glucose-Capillary: 163 mg/dL — ABNORMAL HIGH (ref 70–99)
Glucose-Capillary: 164 mg/dL — ABNORMAL HIGH (ref 70–99)

## 2010-10-07 LAB — POCT CARDIAC MARKERS
CKMB, poc: 1.4 ng/mL (ref 1.0–8.0)
CKMB, poc: 2.6 ng/mL (ref 1.0–8.0)
Myoglobin, poc: 384 ng/mL (ref 12–200)
Myoglobin, poc: >500 ng/mL (ref 12–200)
Troponin i, poc: <0.05 ng/mL (ref 0.00–0.09)
Troponin i, poc: <0.05 ng/mL (ref 0.00–0.09)

## 2010-10-08 ENCOUNTER — Emergency Department (HOSPITAL_COMMUNITY)
Admission: EM | Admit: 2010-10-08 | Discharge: 2010-10-08 | Disposition: A | Discharge status: Home / Self Care | Payer: Medicare Other | Attending: Emergency Medicine | Admitting: Emergency Medicine

## 2010-10-08 ENCOUNTER — Emergency Department (HOSPITAL_COMMUNITY): Payer: Medicare Other

## 2010-10-08 DIAGNOSIS — K219 Gastro-esophageal reflux disease without esophagitis: Secondary | ICD-10-CM | POA: Insufficient documentation

## 2010-10-08 DIAGNOSIS — J4489 Other specified chronic obstructive pulmonary disease: Secondary | ICD-10-CM | POA: Insufficient documentation

## 2010-10-08 DIAGNOSIS — G8929 Other chronic pain: Secondary | ICD-10-CM | POA: Insufficient documentation

## 2010-10-08 DIAGNOSIS — R112 Nausea with vomiting, unspecified: Secondary | ICD-10-CM | POA: Insufficient documentation

## 2010-10-08 DIAGNOSIS — R197 Diarrhea, unspecified: Secondary | ICD-10-CM | POA: Insufficient documentation

## 2010-10-08 DIAGNOSIS — E785 Hyperlipidemia, unspecified: Secondary | ICD-10-CM | POA: Insufficient documentation

## 2010-10-08 DIAGNOSIS — E119 Type 2 diabetes mellitus without complications: Secondary | ICD-10-CM | POA: Insufficient documentation

## 2010-10-08 DIAGNOSIS — J449 Chronic obstructive pulmonary disease, unspecified: Secondary | ICD-10-CM | POA: Insufficient documentation

## 2010-10-08 DIAGNOSIS — R0602 Shortness of breath: Secondary | ICD-10-CM | POA: Insufficient documentation

## 2010-10-08 DIAGNOSIS — Z79899 Other long term (current) drug therapy: Secondary | ICD-10-CM | POA: Insufficient documentation

## 2010-10-08 LAB — DIFFERENTIAL
Basophils Absolute: 0 10*3/uL (ref 0.0–0.1)
Basophils Relative: 0 % (ref 0–1)
Eosinophils Absolute: 0 10*3/uL (ref 0.0–0.7)
Eosinophils Relative: 0 % (ref 0–5)
Lymphocytes Relative: 21 % (ref 12–46)
Lymphs Abs: 1.8 10*3/uL (ref 0.7–4.0)
Monocytes Absolute: 0.8 10*3/uL (ref 0.1–1.0)
Monocytes Relative: 9 % (ref 3–12)
Neutro Abs: 6 10*3/uL (ref 1.7–7.7)
Neutrophils Relative %: 70 % (ref 43–77)

## 2010-10-08 LAB — APTT: aPTT: 30 seconds (ref 24–37)

## 2010-10-08 LAB — CBC
HCT: 39.9 % (ref 39.0–52.0)
Hemoglobin: 13.2 g/dL (ref 13.0–17.0)
MCH: 29.6 pg (ref 26.0–34.0)
MCHC: 33.1 g/dL (ref 30.0–36.0)
MCV: 89.5 fL (ref 78.0–100.0)
Platelets: 159 10*3/uL (ref 150–400)
RBC: 4.46 MIL/uL (ref 4.22–5.81)
RDW: 14.6 % (ref 11.5–15.5)
WBC: 8.5 10*3/uL (ref 4.0–10.5)

## 2010-10-08 LAB — BASIC METABOLIC PANEL
BUN: 18 mg/dL (ref 6–23)
CO2: 21 mEq/L (ref 19–32)
Calcium: 9.2 mg/dL (ref 8.4–10.5)
Chloride: 106 mEq/L (ref 96–112)
Creatinine, Ser: 1.23 mg/dL (ref 0.4–1.5)
GFR calc Af Amer: >60 mL/min (ref >60)
GFR calc non Af Amer: >60 mL/min (ref >60)
Glucose, Bld: 149 mg/dL — ABNORMAL HIGH (ref 70–99)
Potassium: 3.9 mEq/L (ref 3.5–5.1)
Sodium: 139 mEq/L (ref 135–145)

## 2010-10-08 LAB — PROTIME-INR
INR: 1.53 — ABNORMAL HIGH (ref 0.00–1.49)
Prothrombin Time: 18.6 seconds — ABNORMAL HIGH (ref 11.6–15.2)

## 2010-10-13 LAB — POCT CARDIAC MARKERS
CKMB, poc: <1 ng/mL — ABNORMAL LOW (ref 1.0–8.0)
Myoglobin, poc: 79.4 ng/mL (ref 12–200)
Troponin i, poc: <0.05 ng/mL (ref 0.00–0.09)

## 2010-10-13 LAB — DIFFERENTIAL
Basophils Absolute: 0.1 10*3/uL (ref 0.0–0.1)
Basophils Absolute: 0.1 10*3/uL (ref 0.0–0.1)
Basophils Absolute: 0 10*3/uL (ref 0.0–0.1)
Basophils Absolute: 0 10*3/uL (ref 0.0–0.1)
Basophils Absolute: 0.1 10*3/uL (ref 0.0–0.1)
Basophils Absolute: 0.1 10*3/uL (ref 0.0–0.1)
Basophils Relative: 1 % (ref 0–1)
Basophils Relative: 1 % (ref 0–1)
Basophils Relative: 0 % (ref 0–1)
Basophils Relative: 1 % (ref 0–1)
Basophils Relative: 1 % (ref 0–1)
Basophils Relative: 1 % (ref 0–1)
Eosinophils Absolute: 0.4 10*3/uL (ref 0.0–0.7)
Eosinophils Absolute: 0.5 10*3/uL (ref 0.0–0.7)
Eosinophils Absolute: 0 10*3/uL (ref 0.0–0.7)
Eosinophils Absolute: 0.1 10*3/uL (ref 0.0–0.7)
Eosinophils Absolute: 0.3 10*3/uL (ref 0.0–0.7)
Eosinophils Absolute: 0.3 10*3/uL (ref 0.0–0.7)
Eosinophils Relative: 6 % — ABNORMAL HIGH (ref 0–5)
Eosinophils Relative: 6 % — ABNORMAL HIGH (ref 0–5)
Eosinophils Relative: 0 % (ref 0–5)
Eosinophils Relative: 1 % (ref 0–5)
Eosinophils Relative: 4 % (ref 0–5)
Eosinophils Relative: 6 % — ABNORMAL HIGH (ref 0–5)
Lymphocytes Relative: 27 % (ref 12–46)
Lymphocytes Relative: 30 % (ref 12–46)
Lymphocytes Relative: 20 % (ref 12–46)
Lymphocytes Relative: 31 % (ref 12–46)
Lymphocytes Relative: 35 % (ref 12–46)
Lymphocytes Relative: 40 % (ref 12–46)
Lymphs Abs: 2.1 10*3/uL (ref 0.7–4.0)
Lymphs Abs: 2.4 10*3/uL (ref 0.7–4.0)
Lymphs Abs: 1.6 10*3/uL (ref 0.7–4.0)
Lymphs Abs: 1.6 10*3/uL (ref 0.7–4.0)
Lymphs Abs: 2.4 10*3/uL (ref 0.7–4.0)
Lymphs Abs: 2.8 10*3/uL (ref 0.7–4.0)
Monocytes Absolute: 0.9 10*3/uL (ref 0.1–1.0)
Monocytes Absolute: 0.9 10*3/uL (ref 0.1–1.0)
Monocytes Absolute: 0.7 10*3/uL (ref 0.1–1.0)
Monocytes Absolute: 0.8 10*3/uL (ref 0.1–1.0)
Monocytes Absolute: 0.9 10*3/uL (ref 0.1–1.0)
Monocytes Absolute: 1.2 10*3/uL — ABNORMAL HIGH (ref 0.1–1.0)
Monocytes Relative: 11 % (ref 3–12)
Monocytes Relative: 12 % (ref 3–12)
Monocytes Relative: 11 % (ref 3–12)
Monocytes Relative: 12 % (ref 3–12)
Monocytes Relative: 14 % — ABNORMAL HIGH (ref 3–12)
Monocytes Relative: 16 % — ABNORMAL HIGH (ref 3–12)
Neutro Abs: 3.5 10*3/uL (ref 1.7–7.7)
Neutro Abs: 4.8 10*3/uL (ref 1.7–7.7)
Neutro Abs: 2.5 10*3/uL (ref 1.7–7.7)
Neutro Abs: 2.9 10*3/uL (ref 1.7–7.7)
Neutro Abs: 3.9 10*3/uL (ref 1.7–7.7)
Neutro Abs: 4.9 10*3/uL (ref 1.7–7.7)
Neutrophils Relative %: 50 % (ref 43–77)
Neutrophils Relative %: 55 % (ref 43–77)
Neutrophils Relative %: 47 % (ref 43–77)
Neutrophils Relative %: 49 % (ref 43–77)
Neutrophils Relative %: 49 % (ref 43–77)
Neutrophils Relative %: 64 % (ref 43–77)

## 2010-10-13 LAB — LIPID PANEL
Cholesterol: 145 mg/dL (ref 0–200)
HDL: 29 mg/dL — ABNORMAL LOW (ref >39)
LDL Cholesterol: 95 mg/dL (ref 0–99)
Total CHOL/HDL Ratio: 5 RATIO
Triglycerides: 104 mg/dL (ref <150)
VLDL: 21 mg/dL (ref 0–40)

## 2010-10-13 LAB — GLUCOSE, CAPILLARY
Glucose-Capillary: 103 mg/dL — ABNORMAL HIGH (ref 70–99)
Glucose-Capillary: 108 mg/dL — ABNORMAL HIGH (ref 70–99)
Glucose-Capillary: 109 mg/dL — ABNORMAL HIGH (ref 70–99)
Glucose-Capillary: 110 mg/dL — ABNORMAL HIGH (ref 70–99)
Glucose-Capillary: 121 mg/dL — ABNORMAL HIGH (ref 70–99)
Glucose-Capillary: 130 mg/dL — ABNORMAL HIGH (ref 70–99)
Glucose-Capillary: 130 mg/dL — ABNORMAL HIGH (ref 70–99)
Glucose-Capillary: 131 mg/dL — ABNORMAL HIGH (ref 70–99)
Glucose-Capillary: 134 mg/dL — ABNORMAL HIGH (ref 70–99)
Glucose-Capillary: 135 mg/dL — ABNORMAL HIGH (ref 70–99)
Glucose-Capillary: 138 mg/dL — ABNORMAL HIGH (ref 70–99)
Glucose-Capillary: 140 mg/dL — ABNORMAL HIGH (ref 70–99)
Glucose-Capillary: 141 mg/dL — ABNORMAL HIGH (ref 70–99)
Glucose-Capillary: 142 mg/dL — ABNORMAL HIGH (ref 70–99)
Glucose-Capillary: 145 mg/dL — ABNORMAL HIGH (ref 70–99)
Glucose-Capillary: 155 mg/dL — ABNORMAL HIGH (ref 70–99)
Glucose-Capillary: 166 mg/dL — ABNORMAL HIGH (ref 70–99)
Glucose-Capillary: 167 mg/dL — ABNORMAL HIGH (ref 70–99)
Glucose-Capillary: 174 mg/dL — ABNORMAL HIGH (ref 70–99)
Glucose-Capillary: 174 mg/dL — ABNORMAL HIGH (ref 70–99)
Glucose-Capillary: 174 mg/dL — ABNORMAL HIGH (ref 70–99)
Glucose-Capillary: 180 mg/dL — ABNORMAL HIGH (ref 70–99)
Glucose-Capillary: 198 mg/dL — ABNORMAL HIGH (ref 70–99)
Glucose-Capillary: 211 mg/dL — ABNORMAL HIGH (ref 70–99)
Glucose-Capillary: 93 mg/dL (ref 70–99)

## 2010-10-13 LAB — BASIC METABOLIC PANEL
BUN: 26 mg/dL — ABNORMAL HIGH (ref 6–23)
BUN: 15 mg/dL (ref 6–23)
BUN: 16 mg/dL (ref 6–23)
BUN: 16 mg/dL (ref 6–23)
BUN: 17 mg/dL (ref 6–23)
BUN: 18 mg/dL (ref 6–23)
CO2: 29 mEq/L (ref 19–32)
CO2: 25 mEq/L (ref 19–32)
CO2: 25 mEq/L (ref 19–32)
CO2: 26 mEq/L (ref 19–32)
CO2: 28 mEq/L (ref 19–32)
CO2: 29 mEq/L (ref 19–32)
Calcium: 8.8 mg/dL (ref 8.4–10.5)
Calcium: 8 mg/dL — ABNORMAL LOW (ref 8.4–10.5)
Calcium: 8.1 mg/dL — ABNORMAL LOW (ref 8.4–10.5)
Calcium: 8.4 mg/dL (ref 8.4–10.5)
Calcium: 8.8 mg/dL (ref 8.4–10.5)
Calcium: 8.9 mg/dL (ref 8.4–10.5)
Chloride: 106 mEq/L (ref 96–112)
Chloride: 104 mEq/L (ref 96–112)
Chloride: 105 mEq/L (ref 96–112)
Chloride: 105 mEq/L (ref 96–112)
Chloride: 109 mEq/L (ref 96–112)
Chloride: 109 mEq/L (ref 96–112)
Creatinine, Ser: 1.16 mg/dL (ref 0.4–1.5)
Creatinine, Ser: 1.06 mg/dL (ref 0.4–1.5)
Creatinine, Ser: 1.14 mg/dL (ref 0.4–1.5)
Creatinine, Ser: 1.18 mg/dL (ref 0.4–1.5)
Creatinine, Ser: 1.19 mg/dL (ref 0.4–1.5)
Creatinine, Ser: 1.35 mg/dL (ref 0.4–1.5)
GFR calc Af Amer: >60 mL/min (ref >60)
GFR calc Af Amer: >60 mL/min (ref >60)
GFR calc Af Amer: >60 mL/min (ref >60)
GFR calc Af Amer: >60 mL/min (ref >60)
GFR calc Af Amer: >60 mL/min (ref >60)
GFR calc Af Amer: >60 mL/min (ref >60)
GFR calc non Af Amer: >60 mL/min (ref >60)
GFR calc non Af Amer: 58 mL/min — ABNORMAL LOW (ref >60)
GFR calc non Af Amer: >60 mL/min (ref >60)
GFR calc non Af Amer: >60 mL/min (ref >60)
GFR calc non Af Amer: >60 mL/min (ref >60)
GFR calc non Af Amer: >60 mL/min (ref >60)
Glucose, Bld: 103 mg/dL — ABNORMAL HIGH (ref 70–99)
Glucose, Bld: 105 mg/dL — ABNORMAL HIGH (ref 70–99)
Glucose, Bld: 121 mg/dL — ABNORMAL HIGH (ref 70–99)
Glucose, Bld: 139 mg/dL — ABNORMAL HIGH (ref 70–99)
Glucose, Bld: 148 mg/dL — ABNORMAL HIGH (ref 70–99)
Glucose, Bld: 87 mg/dL (ref 70–99)
Potassium: 4.5 mEq/L (ref 3.5–5.1)
Potassium: 3.6 mEq/L (ref 3.5–5.1)
Potassium: 4.1 mEq/L (ref 3.5–5.1)
Potassium: 4.2 mEq/L (ref 3.5–5.1)
Potassium: 4.4 mEq/L (ref 3.5–5.1)
Potassium: 4.5 mEq/L (ref 3.5–5.1)
Sodium: 141 mEq/L (ref 135–145)
Sodium: 137 mEq/L (ref 135–145)
Sodium: 137 mEq/L (ref 135–145)
Sodium: 137 mEq/L (ref 135–145)
Sodium: 138 mEq/L (ref 135–145)
Sodium: 139 mEq/L (ref 135–145)

## 2010-10-13 LAB — CBC
HCT: 39.3 % (ref 39.0–52.0)
HCT: 39.9 % (ref 39.0–52.0)
HCT: 33.9 % — ABNORMAL LOW (ref 39.0–52.0)
HCT: 36.5 % — ABNORMAL LOW (ref 39.0–52.0)
HCT: 36.6 % — ABNORMAL LOW (ref 39.0–52.0)
HCT: 37.9 % — ABNORMAL LOW (ref 39.0–52.0)
Hemoglobin: 12.9 g/dL — ABNORMAL LOW (ref 13.0–17.0)
Hemoglobin: 13.1 g/dL (ref 13.0–17.0)
Hemoglobin: 11.1 g/dL — ABNORMAL LOW (ref 13.0–17.0)
Hemoglobin: 12 g/dL — ABNORMAL LOW (ref 13.0–17.0)
Hemoglobin: 12.1 g/dL — ABNORMAL LOW (ref 13.0–17.0)
Hemoglobin: 12.4 g/dL — ABNORMAL LOW (ref 13.0–17.0)
MCHC: 32.8 g/dL (ref 30.0–36.0)
MCHC: 32.9 g/dL (ref 30.0–36.0)
MCHC: 32 g/dL (ref 30.0–36.0)
MCHC: 32.8 g/dL (ref 30.0–36.0)
MCHC: 32.9 g/dL (ref 30.0–36.0)
MCHC: 33.8 g/dL (ref 30.0–36.0)
MCV: 89.9 fL (ref 78.0–100.0)
MCV: 90.4 fL (ref 78.0–100.0)
MCV: 88.1 fL (ref 78.0–100.0)
MCV: 89.3 fL (ref 78.0–100.0)
MCV: 89.8 fL (ref 78.0–100.0)
MCV: 90.5 fL (ref 78.0–100.0)
Platelets: 198 10*3/uL (ref 150–400)
Platelets: 209 10*3/uL (ref 150–400)
Platelets: 179 10*3/uL (ref 150–400)
Platelets: 187 10*3/uL (ref 150–400)
Platelets: 191 10*3/uL (ref 150–400)
Platelets: 193 10*3/uL (ref 150–400)
RBC: 4.37 MIL/uL (ref 4.22–5.81)
RBC: 4.42 MIL/uL (ref 4.22–5.81)
RBC: 3.8 MIL/uL — ABNORMAL LOW (ref 4.22–5.81)
RBC: 4.06 MIL/uL — ABNORMAL LOW (ref 4.22–5.81)
RBC: 4.16 MIL/uL — ABNORMAL LOW (ref 4.22–5.81)
RBC: 4.19 MIL/uL — ABNORMAL LOW (ref 4.22–5.81)
RDW: 13.7 % (ref 11.5–15.5)
RDW: 14 % (ref 11.5–15.5)
RDW: 13.1 % (ref 11.5–15.5)
RDW: 13.1 % (ref 11.5–15.5)
RDW: 13.3 % (ref 11.5–15.5)
RDW: 13.5 % (ref 11.5–15.5)
WBC: 6.9 10*3/uL (ref 4.0–10.5)
WBC: 8.7 10*3/uL (ref 4.0–10.5)
WBC: 5.2 10*3/uL (ref 4.0–10.5)
WBC: 6.1 10*3/uL (ref 4.0–10.5)
WBC: 7.7 10*3/uL (ref 4.0–10.5)
WBC: 8 10*3/uL (ref 4.0–10.5)

## 2010-10-13 LAB — HEPARIN LEVEL (UNFRACTIONATED): Heparin Unfractionated: 0.46 IU/mL (ref 0.30–0.70)

## 2010-10-13 LAB — COMPREHENSIVE METABOLIC PANEL
ALT: 23 U/L (ref 0–53)
AST: 21 U/L (ref 0–37)
Albumin: 3 g/dL — ABNORMAL LOW (ref 3.5–5.2)
Alkaline Phosphatase: 41 U/L (ref 39–117)
BUN: 29 mg/dL — ABNORMAL HIGH (ref 6–23)
CO2: 30 mEq/L (ref 19–32)
Calcium: 8.6 mg/dL (ref 8.4–10.5)
Chloride: 106 mEq/L (ref 96–112)
Creatinine, Ser: 1.22 mg/dL (ref 0.4–1.5)
GFR calc Af Amer: >60 mL/min (ref >60)
GFR calc non Af Amer: >60 mL/min (ref >60)
Glucose, Bld: 98 mg/dL (ref 70–99)
Potassium: 4.2 mEq/L (ref 3.5–5.1)
Sodium: 138 mEq/L (ref 135–145)
Total Bilirubin: 0.6 mg/dL (ref 0.3–1.2)
Total Protein: 5.7 g/dL — ABNORMAL LOW (ref 6.0–8.3)

## 2010-10-13 LAB — PROTIME-INR
INR: 1.1 (ref 0.00–1.49)
Prothrombin Time: 14 seconds (ref 11.6–15.2)

## 2010-10-13 LAB — CARDIAC PANEL(CRET KIN+CKTOT+MB+TROPI)
CK, MB: 0.8 ng/mL (ref 0.3–4.0)
CK, MB: 0.9 ng/mL (ref 0.3–4.0)
CK, MB: 1.2 ng/mL (ref 0.3–4.0)
Relative Index: INVALID (ref 0.0–2.5)
Relative Index: INVALID (ref 0.0–2.5)
Relative Index: INVALID (ref 0.0–2.5)
Total CK: 20 U/L (ref 7–232)
Total CK: 23 U/L (ref 7–232)
Total CK: 28 U/L (ref 7–232)
Troponin I: 0.01 ng/mL (ref 0.00–0.06)
Troponin I: 0.02 ng/mL (ref 0.00–0.06)
Troponin I: 0.04 ng/mL (ref 0.00–0.06)

## 2010-10-13 LAB — TSH: TSH: 2.905 u[IU]/mL (ref 0.350–4.500)

## 2010-10-13 LAB — PHOSPHORUS: Phosphorus: 4.1 mg/dL (ref 2.3–4.6)

## 2010-10-13 LAB — HEPARIN ANTI-XA: Heparin LMW: 1.02 IU/mL

## 2010-10-13 LAB — HOMOCYSTEINE: Homocysteine: 11.4 umol/L (ref 4.0–15.4)

## 2010-10-13 LAB — VITAMIN B12: Vitamin B-12: 481 pg/mL (ref 211–911)

## 2010-10-13 LAB — APTT: aPTT: 31 seconds (ref 24–37)

## 2010-10-13 LAB — D-DIMER, QUANTITATIVE: D-Dimer, Quant: 0.34 ug/mL-FEU (ref 0.00–0.48)

## 2010-10-13 LAB — BRAIN NATRIURETIC PEPTIDE: Pro B Natriuretic peptide (BNP): 95 pg/mL (ref 0.0–100.0)

## 2010-10-13 LAB — MAGNESIUM: Magnesium: 2.1 mg/dL (ref 1.5–2.5)

## 2010-10-13 LAB — SEDIMENTATION RATE: Sed Rate: 5 mm/hr (ref 0–16)

## 2010-10-13 LAB — VANCOMYCIN, TROUGH: Vancomycin Tr: 13.6 ug/mL (ref 10.0–20.0)

## 2010-10-13 LAB — RPR: RPR Ser Ql: NONREACTIVE

## 2010-10-14 LAB — COMPREHENSIVE METABOLIC PANEL
ALT: 25 U/L (ref 0–53)
AST: 20 U/L (ref 0–37)
Albumin: 3.6 g/dL (ref 3.5–5.2)
Alkaline Phosphatase: 44 U/L (ref 39–117)
BUN: 14 mg/dL (ref 6–23)
CO2: 28 mEq/L (ref 19–32)
Calcium: 9.2 mg/dL (ref 8.4–10.5)
Chloride: 106 mEq/L (ref 96–112)
Creatinine, Ser: 1.35 mg/dL (ref 0.4–1.5)
GFR calc Af Amer: >60 mL/min (ref >60)
GFR calc non Af Amer: 58 mL/min — ABNORMAL LOW (ref >60)
Glucose, Bld: 110 mg/dL — ABNORMAL HIGH (ref 70–99)
Potassium: 3.9 mEq/L (ref 3.5–5.1)
Sodium: 141 mEq/L (ref 135–145)
Total Bilirubin: 0.9 mg/dL (ref 0.3–1.2)
Total Protein: 7 g/dL (ref 6.0–8.3)

## 2010-10-14 LAB — DIFFERENTIAL
Basophils Absolute: 0 10*3/uL (ref 0.0–0.1)
Basophils Absolute: 0.1 10*3/uL (ref 0.0–0.1)
Basophils Relative: 1 % (ref 0–1)
Basophils Relative: 1 % (ref 0–1)
Eosinophils Absolute: 0.2 10*3/uL (ref 0.0–0.7)
Eosinophils Absolute: 0.3 10*3/uL (ref 0.0–0.7)
Eosinophils Relative: 2 % (ref 0–5)
Eosinophils Relative: 3 % (ref 0–5)
Lymphocytes Relative: 14 % (ref 12–46)
Lymphocytes Relative: 30 % (ref 12–46)
Lymphs Abs: 0.9 10*3/uL (ref 0.7–4.0)
Lymphs Abs: 2.5 10*3/uL (ref 0.7–4.0)
Monocytes Absolute: 0.4 10*3/uL (ref 0.1–1.0)
Monocytes Absolute: 0.8 10*3/uL (ref 0.1–1.0)
Monocytes Relative: 10 % (ref 3–12)
Monocytes Relative: 7 % (ref 3–12)
Neutro Abs: 4.7 10*3/uL (ref 1.7–7.7)
Neutro Abs: 5.4 10*3/uL (ref 1.7–7.7)
Neutrophils Relative %: 56 % (ref 43–77)
Neutrophils Relative %: 77 % (ref 43–77)

## 2010-10-14 LAB — CBC
HCT: 37.5 % — ABNORMAL LOW (ref 39.0–52.0)
HCT: 40.3 % (ref 39.0–52.0)
Hemoglobin: 12.5 g/dL — ABNORMAL LOW (ref 13.0–17.0)
Hemoglobin: 13.7 g/dL (ref 13.0–17.0)
MCHC: 33.3 g/dL (ref 30.0–36.0)
MCHC: 34 g/dL (ref 30.0–36.0)
MCV: 89.1 fL (ref 78.0–100.0)
MCV: 90.2 fL (ref 78.0–100.0)
Platelets: 177 10*3/uL (ref 150–400)
Platelets: 213 10*3/uL (ref 150–400)
RBC: 4.15 MIL/uL — ABNORMAL LOW (ref 4.22–5.81)
RBC: 4.52 MIL/uL (ref 4.22–5.81)
RDW: 12.9 % (ref 11.5–15.5)
RDW: 13.3 % (ref 11.5–15.5)
WBC: 6.9 10*3/uL (ref 4.0–10.5)
WBC: 8.3 10*3/uL (ref 4.0–10.5)

## 2010-10-14 LAB — GLUCOSE, CAPILLARY
Glucose-Capillary: 105 mg/dL — ABNORMAL HIGH (ref 70–99)
Glucose-Capillary: 124 mg/dL — ABNORMAL HIGH (ref 70–99)
Glucose-Capillary: 131 mg/dL — ABNORMAL HIGH (ref 70–99)
Glucose-Capillary: 180 mg/dL — ABNORMAL HIGH (ref 70–99)
Glucose-Capillary: 80 mg/dL (ref 70–99)
Glucose-Capillary: 85 mg/dL (ref 70–99)
Glucose-Capillary: 93 mg/dL (ref 70–99)
Glucose-Capillary: 97 mg/dL (ref 70–99)
Glucose-Capillary: 98 mg/dL (ref 70–99)
Glucose-Capillary: 108 mg/dL — ABNORMAL HIGH (ref 70–99)
Glucose-Capillary: 135 mg/dL — ABNORMAL HIGH (ref 70–99)
Glucose-Capillary: 149 mg/dL — ABNORMAL HIGH (ref 70–99)
Glucose-Capillary: 151 mg/dL — ABNORMAL HIGH (ref 70–99)
Glucose-Capillary: 160 mg/dL — ABNORMAL HIGH (ref 70–99)
Glucose-Capillary: 167 mg/dL — ABNORMAL HIGH (ref 70–99)
Glucose-Capillary: 197 mg/dL — ABNORMAL HIGH (ref 70–99)
Glucose-Capillary: 96 mg/dL (ref 70–99)
Glucose-Capillary: 98 mg/dL (ref 70–99)

## 2010-10-14 LAB — BASIC METABOLIC PANEL
BUN: 19 mg/dL (ref 6–23)
BUN: 22 mg/dL (ref 6–23)
BUN: 25 mg/dL — ABNORMAL HIGH (ref 6–23)
CO2: 24 mEq/L (ref 19–32)
CO2: 24 mEq/L (ref 19–32)
CO2: 26 mEq/L (ref 19–32)
Calcium: 8.4 mg/dL (ref 8.4–10.5)
Calcium: 8.5 mg/dL (ref 8.4–10.5)
Calcium: 8.7 mg/dL (ref 8.4–10.5)
Chloride: 103 mEq/L (ref 96–112)
Chloride: 105 mEq/L (ref 96–112)
Chloride: 105 mEq/L (ref 96–112)
Creatinine, Ser: 1.2 mg/dL (ref 0.4–1.5)
Creatinine, Ser: 1.27 mg/dL (ref 0.4–1.5)
Creatinine, Ser: 1.32 mg/dL (ref 0.4–1.5)
GFR calc Af Amer: >60 mL/min (ref >60)
GFR calc Af Amer: >60 mL/min (ref >60)
GFR calc Af Amer: >60 mL/min (ref >60)
GFR calc non Af Amer: 60 mL/min — ABNORMAL LOW (ref >60)
GFR calc non Af Amer: >60 mL/min (ref >60)
GFR calc non Af Amer: >60 mL/min (ref >60)
Glucose, Bld: 112 mg/dL — ABNORMAL HIGH (ref 70–99)
Glucose, Bld: 170 mg/dL — ABNORMAL HIGH (ref 70–99)
Glucose, Bld: 199 mg/dL — ABNORMAL HIGH (ref 70–99)
Potassium: 4.1 mEq/L (ref 3.5–5.1)
Potassium: 4.5 mEq/L (ref 3.5–5.1)
Potassium: 4.5 mEq/L (ref 3.5–5.1)
Sodium: 135 mEq/L (ref 135–145)
Sodium: 136 mEq/L (ref 135–145)
Sodium: 137 mEq/L (ref 135–145)

## 2010-10-14 LAB — POCT CARDIAC MARKERS
CKMB, poc: <1 ng/mL — ABNORMAL LOW (ref 1.0–8.0)
CKMB, poc: <1 ng/mL — ABNORMAL LOW (ref 1.0–8.0)
CKMB, poc: <1 ng/mL — ABNORMAL LOW (ref 1.0–8.0)
Myoglobin, poc: 102 ng/mL (ref 12–200)
Myoglobin, poc: 50.8 ng/mL (ref 12–200)
Myoglobin, poc: 52.4 ng/mL (ref 12–200)
Troponin i, poc: <0.05 ng/mL (ref 0.00–0.09)
Troponin i, poc: <0.05 ng/mL (ref 0.00–0.09)
Troponin i, poc: <0.05 ng/mL (ref 0.00–0.09)

## 2010-10-14 LAB — CARDIAC PANEL(CRET KIN+CKTOT+MB+TROPI)
CK, MB: 0.6 ng/mL (ref 0.3–4.0)
CK, MB: 0.7 ng/mL (ref 0.3–4.0)
CK, MB: 0.9 ng/mL (ref 0.3–4.0)
Relative Index: INVALID (ref 0.0–2.5)
Relative Index: INVALID (ref 0.0–2.5)
Relative Index: INVALID (ref 0.0–2.5)
Total CK: 35 U/L (ref 7–232)
Total CK: 36 U/L (ref 7–232)
Total CK: 39 U/L (ref 7–232)
Troponin I: 0.01 ng/mL (ref 0.00–0.06)
Troponin I: 0.01 ng/mL (ref 0.00–0.06)
Troponin I: <0.01 ng/mL (ref 0.00–0.06)

## 2010-10-14 LAB — PROTIME-INR
INR: 1 (ref 0.00–1.49)
Prothrombin Time: 13.9 seconds (ref 11.6–15.2)

## 2010-10-14 LAB — D-DIMER, QUANTITATIVE: D-Dimer, Quant: 0.61 ug/mL-FEU — ABNORMAL HIGH (ref 0.00–0.48)

## 2010-10-14 LAB — LIPASE, BLOOD: Lipase: 19 U/L (ref 11–59)

## 2010-10-14 LAB — APTT: aPTT: 30 seconds (ref 24–37)

## 2010-10-15 ENCOUNTER — Emergency Department (HOSPITAL_COMMUNITY): Payer: Medicare Other

## 2010-10-15 ENCOUNTER — Inpatient Hospital Stay (HOSPITAL_COMMUNITY): Payer: Medicare Other

## 2010-10-15 ENCOUNTER — Inpatient Hospital Stay (HOSPITAL_COMMUNITY)
Admission: EM | Admit: 2010-10-15 | Discharge: 2010-10-23 | DRG: 871 | Disposition: A | Discharge status: Skilled Nursing Facility | Payer: Medicare Other | Attending: Internal Medicine | Admitting: Internal Medicine

## 2010-10-15 DIAGNOSIS — E871 Hypo-osmolality and hyponatremia: Secondary | ICD-10-CM | POA: Diagnosis present

## 2010-10-15 DIAGNOSIS — R6520 Severe sepsis without septic shock: Secondary | ICD-10-CM | POA: Diagnosis present

## 2010-10-15 DIAGNOSIS — G2581 Restless legs syndrome: Secondary | ICD-10-CM | POA: Diagnosis present

## 2010-10-15 DIAGNOSIS — J449 Chronic obstructive pulmonary disease, unspecified: Secondary | ICD-10-CM | POA: Diagnosis present

## 2010-10-15 DIAGNOSIS — N179 Acute kidney failure, unspecified: Secondary | ICD-10-CM

## 2010-10-15 DIAGNOSIS — K219 Gastro-esophageal reflux disease without esophagitis: Secondary | ICD-10-CM | POA: Diagnosis present

## 2010-10-15 DIAGNOSIS — R6521 Severe sepsis with septic shock: Secondary | ICD-10-CM | POA: Diagnosis present

## 2010-10-15 DIAGNOSIS — D696 Thrombocytopenia, unspecified: Secondary | ICD-10-CM | POA: Diagnosis present

## 2010-10-15 DIAGNOSIS — E876 Hypokalemia: Secondary | ICD-10-CM | POA: Diagnosis present

## 2010-10-15 DIAGNOSIS — G4733 Obstructive sleep apnea (adult) (pediatric): Secondary | ICD-10-CM | POA: Diagnosis present

## 2010-10-15 DIAGNOSIS — J189 Pneumonia, unspecified organism: Secondary | ICD-10-CM | POA: Diagnosis present

## 2010-10-15 DIAGNOSIS — Z87891 Personal history of nicotine dependence: Secondary | ICD-10-CM

## 2010-10-15 DIAGNOSIS — A419 Sepsis, unspecified organism: Principal | ICD-10-CM | POA: Diagnosis present

## 2010-10-15 DIAGNOSIS — Q602 Renal agenesis, unspecified: Secondary | ICD-10-CM

## 2010-10-15 DIAGNOSIS — I129 Hypertensive chronic kidney disease with stage 1 through stage 4 chronic kidney disease, or unspecified chronic kidney disease: Secondary | ICD-10-CM | POA: Diagnosis present

## 2010-10-15 DIAGNOSIS — F341 Dysthymic disorder: Secondary | ICD-10-CM | POA: Diagnosis present

## 2010-10-15 DIAGNOSIS — E872 Acidosis, unspecified: Secondary | ICD-10-CM | POA: Diagnosis present

## 2010-10-15 DIAGNOSIS — E119 Type 2 diabetes mellitus without complications: Secondary | ICD-10-CM | POA: Diagnosis present

## 2010-10-15 DIAGNOSIS — M1990 Unspecified osteoarthritis, unspecified site: Secondary | ICD-10-CM | POA: Diagnosis present

## 2010-10-15 DIAGNOSIS — Z8673 Personal history of transient ischemic attack (TIA), and cerebral infarction without residual deficits: Secondary | ICD-10-CM

## 2010-10-15 DIAGNOSIS — J4489 Other specified chronic obstructive pulmonary disease: Secondary | ICD-10-CM | POA: Diagnosis present

## 2010-10-15 DIAGNOSIS — N189 Chronic kidney disease, unspecified: Secondary | ICD-10-CM | POA: Diagnosis present

## 2010-10-15 DIAGNOSIS — J9600 Acute respiratory failure, unspecified whether with hypoxia or hypercapnia: Secondary | ICD-10-CM

## 2010-10-15 DIAGNOSIS — E785 Hyperlipidemia, unspecified: Secondary | ICD-10-CM | POA: Diagnosis present

## 2010-10-15 DIAGNOSIS — R197 Diarrhea, unspecified: Secondary | ICD-10-CM | POA: Diagnosis present

## 2010-10-15 DIAGNOSIS — D72829 Elevated white blood cell count, unspecified: Secondary | ICD-10-CM | POA: Diagnosis present

## 2010-10-15 DIAGNOSIS — Z66 Do not resuscitate: Secondary | ICD-10-CM | POA: Diagnosis present

## 2010-10-15 DIAGNOSIS — Q605 Renal hypoplasia, unspecified: Secondary | ICD-10-CM

## 2010-10-15 DIAGNOSIS — Z7901 Long term (current) use of anticoagulants: Secondary | ICD-10-CM

## 2010-10-15 DIAGNOSIS — M109 Gout, unspecified: Secondary | ICD-10-CM | POA: Diagnosis present

## 2010-10-15 DIAGNOSIS — D649 Anemia, unspecified: Secondary | ICD-10-CM | POA: Diagnosis present

## 2010-10-15 DIAGNOSIS — E782 Mixed hyperlipidemia: Secondary | ICD-10-CM

## 2010-10-15 DIAGNOSIS — G40909 Epilepsy, unspecified, not intractable, without status epilepticus: Secondary | ICD-10-CM | POA: Diagnosis present

## 2010-10-15 LAB — CBC
HCT: 39.2 % (ref 39.0–52.0)
Hemoglobin: 12.8 g/dL — ABNORMAL LOW (ref 13.0–17.0)
MCH: 29.3 pg (ref 26.0–34.0)
MCHC: 32.7 g/dL (ref 30.0–36.0)
MCV: 89.7 fL (ref 78.0–100.0)
Platelets: 181 10*3/uL (ref 150–400)
RBC: 4.37 MIL/uL (ref 4.22–5.81)
RDW: 15.2 % (ref 11.5–15.5)
WBC: 11.8 10*3/uL — ABNORMAL HIGH (ref 4.0–10.5)

## 2010-10-15 LAB — BASIC METABOLIC PANEL
BUN: 39 mg/dL — ABNORMAL HIGH (ref 6–23)
BUN: 38 mg/dL — ABNORMAL HIGH (ref 6–23)
CO2: 18 mEq/L — ABNORMAL LOW (ref 19–32)
CO2: 21 mEq/L (ref 19–32)
Calcium: 8.2 mg/dL — ABNORMAL LOW (ref 8.4–10.5)
Calcium: 7.6 mg/dL — ABNORMAL LOW (ref 8.4–10.5)
Chloride: 100 mEq/L (ref 96–112)
Chloride: 100 mEq/L (ref 96–112)
Creatinine, Ser: 7.58 mg/dL — ABNORMAL HIGH (ref 0.4–1.5)
Creatinine, Ser: 7.33 mg/dL — ABNORMAL HIGH (ref 0.4–1.5)
GFR calc Af Amer: 9 mL/min — ABNORMAL LOW (ref >60)
GFR calc Af Amer: 10 mL/min — ABNORMAL LOW (ref >60)
GFR calc non Af Amer: 8 mL/min — ABNORMAL LOW (ref >60)
GFR calc non Af Amer: 8 mL/min — ABNORMAL LOW (ref >60)
Glucose, Bld: 137 mg/dL — ABNORMAL HIGH (ref 70–99)
Glucose, Bld: 176 mg/dL — ABNORMAL HIGH (ref 70–99)
Potassium: 4 mEq/L (ref 3.5–5.1)
Potassium: 3.7 mEq/L (ref 3.5–5.1)
Sodium: 129 mEq/L — ABNORMAL LOW (ref 135–145)
Sodium: 131 mEq/L — ABNORMAL LOW (ref 135–145)

## 2010-10-15 LAB — POCT I-STAT 3, ART BLOOD GAS (G3+)
Acid-base deficit: 10 mmol/L — ABNORMAL HIGH (ref 0.0–2.0)
Bicarbonate: 19 mEq/L — ABNORMAL LOW (ref 20.0–24.0)
O2 Saturation: 94 %
Patient temperature: 98.6
TCO2: 21 mmol/L (ref 0–100)
pCO2 arterial: 55.6 mmHg — ABNORMAL HIGH (ref 35.0–45.0)
pH, Arterial: 7.142 — CL (ref 7.350–7.450)
pO2, Arterial: 93 mmHg (ref 80.0–100.0)

## 2010-10-15 LAB — BLOOD GAS, ARTERIAL
Acid-base deficit: 6.5 mmol/L — ABNORMAL HIGH (ref 0.0–2.0)
Bicarbonate: 20.6 mEq/L (ref 20.0–24.0)
FIO2: 0.28 %
O2 Saturation: 94.1 %
Patient temperature: 98.6
TCO2: 22.4 mmol/L (ref 0–100)
pCO2 arterial: 57.7 mmHg (ref 35.0–45.0)
pH, Arterial: 7.179 — CL (ref 7.350–7.450)
pO2, Arterial: 76.1 mmHg — ABNORMAL LOW (ref 80.0–100.0)

## 2010-10-15 LAB — CK TOTAL AND CKMB (NOT AT ARMC)
CK, MB: 2.1 ng/mL (ref 0.3–4.0)
Relative Index: 1.3 (ref 0.0–2.5)
Total CK: 167 U/L (ref 7–232)

## 2010-10-15 LAB — DIFFERENTIAL
Basophils Absolute: 0.1 10*3/uL (ref 0.0–0.1)
Basophils Relative: 1 % (ref 0–1)
Eosinophils Absolute: 0.4 10*3/uL (ref 0.0–0.7)
Eosinophils Relative: 4 % (ref 0–5)
Lymphocytes Relative: 18 % (ref 12–46)
Lymphs Abs: 2.1 10*3/uL (ref 0.7–4.0)
Monocytes Absolute: 1.4 10*3/uL — ABNORMAL HIGH (ref 0.1–1.0)
Monocytes Relative: 12 % (ref 3–12)
Neutro Abs: 7.9 10*3/uL — ABNORMAL HIGH (ref 1.7–7.7)
Neutrophils Relative %: 67 % (ref 43–77)

## 2010-10-15 LAB — URINALYSIS, ROUTINE W REFLEX MICROSCOPIC
Glucose, UA: NEGATIVE mg/dL
Hgb urine dipstick: NEGATIVE
Ketones, ur: NEGATIVE mg/dL
Nitrite: NEGATIVE
Protein, ur: NEGATIVE mg/dL
Specific Gravity, Urine: 1.025 (ref 1.005–1.030)
Urobilinogen, UA: 0.2 mg/dL (ref 0.0–1.0)
pH: 5 (ref 5.0–8.0)

## 2010-10-15 LAB — TROPONIN I: Troponin I: 0.02 ng/mL (ref 0.00–0.06)

## 2010-10-15 LAB — PROTIME-INR
INR: 2.17 — ABNORMAL HIGH (ref 0.00–1.49)
Prothrombin Time: 24.3 seconds — ABNORMAL HIGH (ref 11.6–15.2)

## 2010-10-15 LAB — URINE MICROSCOPIC-ADD ON

## 2010-10-15 LAB — LACTIC ACID, PLASMA: Lactic Acid, Venous: 2.3 mmol/L — ABNORMAL HIGH (ref 0.5–2.2)

## 2010-10-15 LAB — PHOSPHORUS: Phosphorus: 6 mg/dL — ABNORMAL HIGH (ref 2.3–4.6)

## 2010-10-15 LAB — SODIUM, URINE, RANDOM: Sodium, Ur: 22 mEq/L

## 2010-10-15 LAB — PROCALCITONIN: Procalcitonin: 0.91 ng/mL

## 2010-10-15 LAB — MRSA PCR SCREENING: MRSA by PCR: POSITIVE — AB

## 2010-10-15 LAB — CREATININE, URINE, RANDOM: Creatinine, Urine: 268.8 mg/dL

## 2010-10-15 LAB — MAGNESIUM: Magnesium: 2.1 mg/dL (ref 1.5–2.5)

## 2010-10-16 ENCOUNTER — Inpatient Hospital Stay (HOSPITAL_COMMUNITY): Payer: Medicare Other

## 2010-10-16 LAB — BASIC METABOLIC PANEL
BUN: 38 mg/dL — ABNORMAL HIGH (ref 6–23)
BUN: 38 mg/dL — ABNORMAL HIGH (ref 6–23)
BUN: 39 mg/dL — ABNORMAL HIGH (ref 6–23)
CO2: 22 mEq/L (ref 19–32)
CO2: 23 mEq/L (ref 19–32)
CO2: 25 mEq/L (ref 19–32)
Calcium: 7.3 mg/dL — ABNORMAL LOW (ref 8.4–10.5)
Calcium: 7.5 mg/dL — ABNORMAL LOW (ref 8.4–10.5)
Calcium: 7.7 mg/dL — ABNORMAL LOW (ref 8.4–10.5)
Chloride: 101 mEq/L (ref 96–112)
Chloride: 101 mEq/L (ref 96–112)
Chloride: 104 mEq/L (ref 96–112)
Creatinine, Ser: 6.78 mg/dL — ABNORMAL HIGH (ref 0.4–1.5)
Creatinine, Ser: 7.13 mg/dL — ABNORMAL HIGH (ref 0.4–1.5)
Creatinine, Ser: 7.22 mg/dL — ABNORMAL HIGH (ref 0.4–1.5)
GFR calc Af Amer: 10 mL/min — ABNORMAL LOW (ref >60)
GFR calc Af Amer: 10 mL/min — ABNORMAL LOW (ref >60)
GFR calc Af Amer: 11 mL/min — ABNORMAL LOW (ref >60)
GFR calc non Af Amer: 8 mL/min — ABNORMAL LOW (ref >60)
GFR calc non Af Amer: 8 mL/min — ABNORMAL LOW (ref >60)
GFR calc non Af Amer: 9 mL/min — ABNORMAL LOW (ref >60)
Glucose, Bld: 163 mg/dL — ABNORMAL HIGH (ref 70–99)
Glucose, Bld: 187 mg/dL — ABNORMAL HIGH (ref 70–99)
Glucose, Bld: 207 mg/dL — ABNORMAL HIGH (ref 70–99)
Potassium: 3.9 mEq/L (ref 3.5–5.1)
Potassium: 4 mEq/L (ref 3.5–5.1)
Potassium: 4.1 mEq/L (ref 3.5–5.1)
Sodium: 132 mEq/L — ABNORMAL LOW (ref 135–145)
Sodium: 133 mEq/L — ABNORMAL LOW (ref 135–145)
Sodium: 134 mEq/L — ABNORMAL LOW (ref 135–145)

## 2010-10-16 LAB — POCT I-STAT 3, ART BLOOD GAS (G3+)
Acid-base deficit: 6 mmol/L — ABNORMAL HIGH (ref 0.0–2.0)
Bicarbonate: 23.5 mEq/L (ref 20.0–24.0)
O2 Saturation: 97 %
Patient temperature: 97.4
TCO2: 25 mmol/L (ref 0–100)
pCO2 arterial: 63.4 mmHg (ref 35.0–45.0)
pH, Arterial: 7.173 — CL (ref 7.350–7.450)
pO2, Arterial: 120 mmHg — ABNORMAL HIGH (ref 80.0–100.0)

## 2010-10-16 LAB — CBC
HCT: 33.5 % — ABNORMAL LOW (ref 39.0–52.0)
HCT: 33.5 % — ABNORMAL LOW (ref 39.0–52.0)
Hemoglobin: 10.7 g/dL — ABNORMAL LOW (ref 13.0–17.0)
Hemoglobin: 10.8 g/dL — ABNORMAL LOW (ref 13.0–17.0)
MCH: 28.6 pg (ref 26.0–34.0)
MCH: 28.5 pg (ref 26.0–34.0)
MCHC: 31.9 g/dL (ref 30.0–36.0)
MCHC: 32.2 g/dL (ref 30.0–36.0)
MCV: 89.6 fL (ref 78.0–100.0)
MCV: 88.4 fL (ref 78.0–100.0)
Platelets: 127 10*3/uL — ABNORMAL LOW (ref 150–400)
Platelets: 149 10*3/uL — ABNORMAL LOW (ref 150–400)
RBC: 3.74 MIL/uL — ABNORMAL LOW (ref 4.22–5.81)
RBC: 3.79 MIL/uL — ABNORMAL LOW (ref 4.22–5.81)
RDW: 15.1 % (ref 11.5–15.5)
RDW: 14.8 % (ref 11.5–15.5)
WBC: 7.6 10*3/uL (ref 4.0–10.5)
WBC: 7.2 10*3/uL (ref 4.0–10.5)

## 2010-10-16 LAB — CK TOTAL AND CKMB (NOT AT ARMC)
CK, MB: 2.7 ng/mL (ref 0.3–4.0)
Relative Index: 1.9 (ref 0.0–2.5)
Total CK: 141 U/L (ref 7–232)

## 2010-10-16 LAB — GLUCOSE, CAPILLARY
Glucose-Capillary: 195 mg/dL — ABNORMAL HIGH (ref 70–99)
Glucose-Capillary: 183 mg/dL — ABNORMAL HIGH (ref 70–99)
Glucose-Capillary: 167 mg/dL — ABNORMAL HIGH (ref 70–99)
Glucose-Capillary: 164 mg/dL — ABNORMAL HIGH (ref 70–99)

## 2010-10-16 LAB — COMPREHENSIVE METABOLIC PANEL
ALT: 28 U/L (ref 0–53)
AST: 21 U/L (ref 0–37)
Albumin: 2.6 g/dL — ABNORMAL LOW (ref 3.5–5.2)
Alkaline Phosphatase: 50 U/L (ref 39–117)
BUN: 39 mg/dL — ABNORMAL HIGH (ref 6–23)
CO2: 26 mEq/L (ref 19–32)
Calcium: 7.6 mg/dL — ABNORMAL LOW (ref 8.4–10.5)
Chloride: 99 mEq/L (ref 96–112)
Creatinine, Ser: 6.43 mg/dL — ABNORMAL HIGH (ref 0.4–1.5)
GFR calc Af Amer: 11 mL/min — ABNORMAL LOW (ref >60)
GFR calc non Af Amer: 9 mL/min — ABNORMAL LOW (ref >60)
Glucose, Bld: 181 mg/dL — ABNORMAL HIGH (ref 70–99)
Potassium: 4 mEq/L (ref 3.5–5.1)
Sodium: 132 mEq/L — ABNORMAL LOW (ref 135–145)
Total Bilirubin: 0.6 mg/dL (ref 0.3–1.2)
Total Protein: 6.3 g/dL (ref 6.0–8.3)

## 2010-10-16 LAB — LACTIC ACID, PLASMA: Lactic Acid, Venous: 1 mmol/L (ref 0.5–2.2)

## 2010-10-16 LAB — PREPARE FRESH FROZEN PLASMA
Unit division: 0
Unit division: 0

## 2010-10-16 LAB — CORTISOL: Cortisol, Plasma: 20.2 ug/dL

## 2010-10-16 LAB — DIFFERENTIAL
Basophils Absolute: 0 10*3/uL (ref 0.0–0.1)
Basophils Relative: 0 % (ref 0–1)
Eosinophils Absolute: 0 10*3/uL (ref 0.0–0.7)
Eosinophils Relative: 0 % (ref 0–5)
Lymphocytes Relative: 9 % — ABNORMAL LOW (ref 12–46)
Lymphs Abs: 0.6 10*3/uL — ABNORMAL LOW (ref 0.7–4.0)
Monocytes Absolute: 0.5 10*3/uL (ref 0.1–1.0)
Monocytes Relative: 7 % (ref 3–12)
Neutro Abs: 6.1 10*3/uL (ref 1.7–7.7)
Neutrophils Relative %: 85 % — ABNORMAL HIGH (ref 43–77)

## 2010-10-16 LAB — PHOSPHORUS: Phosphorus: 5.3 mg/dL — ABNORMAL HIGH (ref 2.3–4.6)

## 2010-10-16 LAB — PROTIME-INR
INR: 2.36 — ABNORMAL HIGH (ref 0.00–1.49)
INR: 2.39 — ABNORMAL HIGH (ref 0.00–1.49)
Prothrombin Time: 25.9 seconds — ABNORMAL HIGH (ref 11.6–15.2)
Prothrombin Time: 26.2 seconds — ABNORMAL HIGH (ref 11.6–15.2)

## 2010-10-16 LAB — PROCALCITONIN: Procalcitonin: 0.62 ng/mL

## 2010-10-16 LAB — APTT: aPTT: 57 seconds — ABNORMAL HIGH (ref 24–37)

## 2010-10-16 LAB — MAGNESIUM: Magnesium: 1.8 mg/dL (ref 1.5–2.5)

## 2010-10-17 LAB — COMPREHENSIVE METABOLIC PANEL
ALT: 24 U/L (ref 0–53)
AST: 19 U/L (ref 0–37)
Albumin: 2.5 g/dL — ABNORMAL LOW (ref 3.5–5.2)
Alkaline Phosphatase: 49 U/L (ref 39–117)
BUN: 39 mg/dL — ABNORMAL HIGH (ref 6–23)
CO2: 29 mEq/L (ref 19–32)
Calcium: 7.8 mg/dL — ABNORMAL LOW (ref 8.4–10.5)
Chloride: 100 mEq/L (ref 96–112)
Creatinine, Ser: 5.36 mg/dL — ABNORMAL HIGH (ref 0.4–1.5)
GFR calc Af Amer: 14 mL/min — ABNORMAL LOW (ref >60)
GFR calc non Af Amer: 12 mL/min — ABNORMAL LOW (ref >60)
Glucose, Bld: 176 mg/dL — ABNORMAL HIGH (ref 70–99)
Potassium: 4 mEq/L (ref 3.5–5.1)
Sodium: 136 mEq/L (ref 135–145)
Total Bilirubin: 0.4 mg/dL (ref 0.3–1.2)
Total Protein: 5.9 g/dL — ABNORMAL LOW (ref 6.0–8.3)

## 2010-10-17 LAB — BASIC METABOLIC PANEL
BUN: 39 mg/dL — ABNORMAL HIGH (ref 6–23)
BUN: 38 mg/dL — ABNORMAL HIGH (ref 6–23)
CO2: 27 mEq/L (ref 19–32)
CO2: 29 mEq/L (ref 19–32)
Calcium: 7.6 mg/dL — ABNORMAL LOW (ref 8.4–10.5)
Calcium: 7.7 mg/dL — ABNORMAL LOW (ref 8.4–10.5)
Chloride: 101 mEq/L (ref 96–112)
Chloride: 104 mEq/L (ref 96–112)
Creatinine, Ser: 5.69 mg/dL — ABNORMAL HIGH (ref 0.4–1.5)
Creatinine, Ser: 3.96 mg/dL — ABNORMAL HIGH (ref 0.4–1.5)
GFR calc Af Amer: 13 mL/min — ABNORMAL LOW (ref >60)
GFR calc Af Amer: 20 mL/min — ABNORMAL LOW (ref >60)
GFR calc non Af Amer: 11 mL/min — ABNORMAL LOW (ref >60)
GFR calc non Af Amer: 17 mL/min — ABNORMAL LOW (ref >60)
Glucose, Bld: 178 mg/dL — ABNORMAL HIGH (ref 70–99)
Glucose, Bld: 163 mg/dL — ABNORMAL HIGH (ref 70–99)
Potassium: 3.9 mEq/L (ref 3.5–5.1)
Potassium: 3.5 mEq/L (ref 3.5–5.1)
Sodium: 134 mEq/L — ABNORMAL LOW (ref 135–145)
Sodium: 138 mEq/L (ref 135–145)

## 2010-10-17 LAB — CK TOTAL AND CKMB (NOT AT ARMC)
CK, MB: 1.7 ng/mL (ref 0.3–4.0)
Relative Index: INVALID (ref 0.0–2.5)
Total CK: 83 U/L (ref 7–232)

## 2010-10-17 LAB — URINE MICROSCOPIC-ADD ON

## 2010-10-17 LAB — GLUCOSE, CAPILLARY
Glucose-Capillary: 147 mg/dL — ABNORMAL HIGH (ref 70–99)
Glucose-Capillary: 152 mg/dL — ABNORMAL HIGH (ref 70–99)
Glucose-Capillary: 157 mg/dL — ABNORMAL HIGH (ref 70–99)
Glucose-Capillary: 160 mg/dL — ABNORMAL HIGH (ref 70–99)
Glucose-Capillary: 166 mg/dL — ABNORMAL HIGH (ref 70–99)

## 2010-10-17 LAB — POCT I-STAT 3, ART BLOOD GAS (G3+)
Acid-Base Excess: 1 mmol/L (ref 0.0–2.0)
Bicarbonate: 28.1 mEq/L — ABNORMAL HIGH (ref 20.0–24.0)
O2 Saturation: 94 %
Patient temperature: 98.2
TCO2: 30 mmol/L (ref 0–100)
pCO2 arterial: 54.4 mmHg — ABNORMAL HIGH (ref 35.0–45.0)
pH, Arterial: 7.32 — ABNORMAL LOW (ref 7.350–7.450)
pO2, Arterial: 76 mmHg — ABNORMAL LOW (ref 80.0–100.0)

## 2010-10-17 LAB — URINALYSIS, ROUTINE W REFLEX MICROSCOPIC
Bilirubin Urine: NEGATIVE
Glucose, UA: NEGATIVE mg/dL
Ketones, ur: NEGATIVE mg/dL
Nitrite: NEGATIVE
Protein, ur: NEGATIVE mg/dL
Specific Gravity, Urine: 1.014 (ref 1.005–1.030)
Urobilinogen, UA: 0.2 mg/dL (ref 0.0–1.0)
pH: 5.5 (ref 5.0–8.0)

## 2010-10-17 LAB — CBC
HCT: 31.2 % — ABNORMAL LOW (ref 39.0–52.0)
Hemoglobin: 10.4 g/dL — ABNORMAL LOW (ref 13.0–17.0)
MCH: 29.4 pg (ref 26.0–34.0)
MCHC: 33.3 g/dL (ref 30.0–36.0)
MCV: 88.1 fL (ref 78.0–100.0)
Platelets: 114 10*3/uL — ABNORMAL LOW (ref 150–400)
RBC: 3.54 MIL/uL — ABNORMAL LOW (ref 4.22–5.81)
RDW: 14.7 % (ref 11.5–15.5)
WBC: 3.6 10*3/uL — ABNORMAL LOW (ref 4.0–10.5)

## 2010-10-17 LAB — PROTIME-INR
INR: 2.41 — ABNORMAL HIGH (ref 0.00–1.49)
Prothrombin Time: 26.4 seconds — ABNORMAL HIGH (ref 11.6–15.2)

## 2010-10-17 LAB — MAGNESIUM: Magnesium: 1.8 mg/dL (ref 1.5–2.5)

## 2010-10-17 LAB — PHOSPHORUS: Phosphorus: 4.1 mg/dL (ref 2.3–4.6)

## 2010-10-17 LAB — AMMONIA: Ammonia: 35 umol/L (ref 11–35)

## 2010-10-18 LAB — COMPREHENSIVE METABOLIC PANEL
ALT: 21 U/L (ref 0–53)
AST: 20 U/L (ref 0–37)
Albumin: 2.4 g/dL — ABNORMAL LOW (ref 3.5–5.2)
Alkaline Phosphatase: 41 U/L (ref 39–117)
BUN: 37 mg/dL — ABNORMAL HIGH (ref 6–23)
CO2: 28 mEq/L (ref 19–32)
Calcium: 7.7 mg/dL — ABNORMAL LOW (ref 8.4–10.5)
Chloride: 102 mEq/L (ref 96–112)
Creatinine, Ser: 2.74 mg/dL — ABNORMAL HIGH (ref 0.4–1.5)
GFR calc Af Amer: 31 mL/min — ABNORMAL LOW (ref >60)
GFR calc non Af Amer: 25 mL/min — ABNORMAL LOW (ref >60)
Glucose, Bld: 142 mg/dL — ABNORMAL HIGH (ref 70–99)
Potassium: 3.3 mEq/L — ABNORMAL LOW (ref 3.5–5.1)
Sodium: 137 mEq/L (ref 135–145)
Total Bilirubin: 0.8 mg/dL (ref 0.3–1.2)
Total Protein: 5.4 g/dL — ABNORMAL LOW (ref 6.0–8.3)

## 2010-10-18 LAB — URINE CULTURE
Colony Count: NO GROWTH
Culture  Setup Time: 201204201710
Culture: NO GROWTH

## 2010-10-18 LAB — GLUCOSE, CAPILLARY
Glucose-Capillary: 115 mg/dL — ABNORMAL HIGH (ref 70–99)
Glucose-Capillary: 118 mg/dL — ABNORMAL HIGH (ref 70–99)
Glucose-Capillary: 129 mg/dL — ABNORMAL HIGH (ref 70–99)
Glucose-Capillary: 136 mg/dL — ABNORMAL HIGH (ref 70–99)
Glucose-Capillary: 139 mg/dL — ABNORMAL HIGH (ref 70–99)
Glucose-Capillary: 142 mg/dL — ABNORMAL HIGH (ref 70–99)

## 2010-10-18 LAB — PROTIME-INR
INR: 1.78 — ABNORMAL HIGH (ref 0.00–1.49)
Prothrombin Time: 20.9 seconds — ABNORMAL HIGH (ref 11.6–15.2)

## 2010-10-19 ENCOUNTER — Inpatient Hospital Stay (HOSPITAL_COMMUNITY): Payer: Medicare Other

## 2010-10-19 LAB — GLUCOSE, CAPILLARY
Glucose-Capillary: 123 mg/dL — ABNORMAL HIGH (ref 70–99)
Glucose-Capillary: 124 mg/dL — ABNORMAL HIGH (ref 70–99)
Glucose-Capillary: 128 mg/dL — ABNORMAL HIGH (ref 70–99)
Glucose-Capillary: 133 mg/dL — ABNORMAL HIGH (ref 70–99)
Glucose-Capillary: 136 mg/dL — ABNORMAL HIGH (ref 70–99)
Glucose-Capillary: 143 mg/dL — ABNORMAL HIGH (ref 70–99)

## 2010-10-19 LAB — CBC
HCT: 29.9 % — ABNORMAL LOW (ref 39.0–52.0)
HCT: 30.2 % — ABNORMAL LOW (ref 39.0–52.0)
Hemoglobin: 9.9 g/dL — ABNORMAL LOW (ref 13.0–17.0)
Hemoglobin: 9.9 g/dL — ABNORMAL LOW (ref 13.0–17.0)
MCH: 29.3 pg (ref 26.0–34.0)
MCH: 28.9 pg (ref 26.0–34.0)
MCHC: 33.1 g/dL (ref 30.0–36.0)
MCHC: 32.8 g/dL (ref 30.0–36.0)
MCV: 88.5 fL (ref 78.0–100.0)
MCV: 88 fL (ref 78.0–100.0)
Platelets: 151 10*3/uL (ref 150–400)
Platelets: 163 10*3/uL (ref 150–400)
RBC: 3.38 MIL/uL — ABNORMAL LOW (ref 4.22–5.81)
RBC: 3.43 MIL/uL — ABNORMAL LOW (ref 4.22–5.81)
RDW: 14.8 % (ref 11.5–15.5)
RDW: 14.6 % (ref 11.5–15.5)
WBC: 3.5 10*3/uL — ABNORMAL LOW (ref 4.0–10.5)
WBC: 4 10*3/uL (ref 4.0–10.5)

## 2010-10-19 LAB — BASIC METABOLIC PANEL
BUN: 28 mg/dL — ABNORMAL HIGH (ref 6–23)
CO2: 28 mEq/L (ref 19–32)
Calcium: 7.8 mg/dL — ABNORMAL LOW (ref 8.4–10.5)
Chloride: 107 mEq/L (ref 96–112)
Creatinine, Ser: 1.59 mg/dL — ABNORMAL HIGH (ref 0.4–1.5)
GFR calc Af Amer: 58 mL/min — ABNORMAL LOW (ref >60)
GFR calc non Af Amer: 48 mL/min — ABNORMAL LOW (ref >60)
Glucose, Bld: 135 mg/dL — ABNORMAL HIGH (ref 70–99)
Potassium: 3.5 mEq/L (ref 3.5–5.1)
Sodium: 138 mEq/L (ref 135–145)

## 2010-10-19 LAB — PROTIME-INR
INR: 1.53 — ABNORMAL HIGH (ref 0.00–1.49)
Prothrombin Time: 18.6 seconds — ABNORMAL HIGH (ref 11.6–15.2)

## 2010-10-20 LAB — GLUCOSE, CAPILLARY
Glucose-Capillary: 112 mg/dL — ABNORMAL HIGH (ref 70–99)
Glucose-Capillary: 114 mg/dL — ABNORMAL HIGH (ref 70–99)
Glucose-Capillary: 128 mg/dL — ABNORMAL HIGH (ref 70–99)
Glucose-Capillary: 133 mg/dL — ABNORMAL HIGH (ref 70–99)
Glucose-Capillary: 147 mg/dL — ABNORMAL HIGH (ref 70–99)

## 2010-10-20 LAB — CBC
HCT: 31.3 % — ABNORMAL LOW (ref 39.0–52.0)
Hemoglobin: 10.5 g/dL — ABNORMAL LOW (ref 13.0–17.0)
MCH: 29.5 pg (ref 26.0–34.0)
MCHC: 33.5 g/dL (ref 30.0–36.0)
MCV: 87.9 fL (ref 78.0–100.0)
Platelets: 159 10*3/uL (ref 150–400)
RBC: 3.56 MIL/uL — ABNORMAL LOW (ref 4.22–5.81)
RDW: 14.6 % (ref 11.5–15.5)
WBC: 5.5 10*3/uL (ref 4.0–10.5)

## 2010-10-20 LAB — BASIC METABOLIC PANEL
BUN: 25 mg/dL — ABNORMAL HIGH (ref 6–23)
CO2: 26 mEq/L (ref 19–32)
Calcium: 8.2 mg/dL — ABNORMAL LOW (ref 8.4–10.5)
Chloride: 106 mEq/L (ref 96–112)
Creatinine, Ser: 1.33 mg/dL (ref 0.4–1.5)
GFR calc Af Amer: >60 mL/min (ref >60)
GFR calc non Af Amer: 58 mL/min — ABNORMAL LOW (ref >60)
Glucose, Bld: 142 mg/dL — ABNORMAL HIGH (ref 70–99)
Potassium: 3.2 mEq/L — ABNORMAL LOW (ref 3.5–5.1)
Sodium: 140 mEq/L (ref 135–145)

## 2010-10-20 LAB — PROTIME-INR
INR: 1.39 (ref 0.00–1.49)
Prothrombin Time: 17.3 seconds — ABNORMAL HIGH (ref 11.6–15.2)

## 2010-10-20 LAB — CLOSTRIDIUM DIFFICILE BY PCR: Toxigenic C. Difficile by PCR: NEGATIVE

## 2010-10-21 ENCOUNTER — Inpatient Hospital Stay (HOSPITAL_COMMUNITY): Payer: Medicare Other

## 2010-10-21 LAB — CBC
HCT: 32.5 % — ABNORMAL LOW (ref 39.0–52.0)
Hemoglobin: 10.7 g/dL — ABNORMAL LOW (ref 13.0–17.0)
MCH: 29.1 pg (ref 26.0–34.0)
MCHC: 32.9 g/dL (ref 30.0–36.0)
MCV: 88.3 fL (ref 78.0–100.0)
Platelets: 170 10*3/uL (ref 150–400)
RBC: 3.68 MIL/uL — ABNORMAL LOW (ref 4.22–5.81)
RDW: 14.7 % (ref 11.5–15.5)
WBC: 7 10*3/uL (ref 4.0–10.5)

## 2010-10-21 LAB — BRAIN NATRIURETIC PEPTIDE: Pro B Natriuretic peptide (BNP): 978 pg/mL — ABNORMAL HIGH (ref 0.0–100.0)

## 2010-10-21 LAB — BASIC METABOLIC PANEL
BUN: 20 mg/dL (ref 6–23)
CO2: 28 mEq/L (ref 19–32)
Calcium: 8.3 mg/dL — ABNORMAL LOW (ref 8.4–10.5)
Chloride: 106 mEq/L (ref 96–112)
Creatinine, Ser: 1.32 mg/dL (ref 0.4–1.5)
GFR calc Af Amer: >60 mL/min (ref >60)
GFR calc non Af Amer: 59 mL/min — ABNORMAL LOW (ref >60)
Glucose, Bld: 157 mg/dL — ABNORMAL HIGH (ref 70–99)
Potassium: 3.1 mEq/L — ABNORMAL LOW (ref 3.5–5.1)
Sodium: 140 mEq/L (ref 135–145)

## 2010-10-21 LAB — CULTURE, BLOOD (ROUTINE X 2)
Culture  Setup Time: 201204182152
Culture: NO GROWTH

## 2010-10-21 LAB — PROTIME-INR
INR: 1.65 — ABNORMAL HIGH (ref 0.00–1.49)
Prothrombin Time: 19.7 seconds — ABNORMAL HIGH (ref 11.6–15.2)

## 2010-10-21 LAB — GLUCOSE, CAPILLARY
Glucose-Capillary: 141 mg/dL — ABNORMAL HIGH (ref 70–99)
Glucose-Capillary: 125 mg/dL — ABNORMAL HIGH (ref 70–99)
Glucose-Capillary: 122 mg/dL — ABNORMAL HIGH (ref 70–99)
Glucose-Capillary: 111 mg/dL — ABNORMAL HIGH (ref 70–99)

## 2010-10-21 LAB — MAGNESIUM: Magnesium: 1.4 mg/dL — ABNORMAL LOW (ref 1.5–2.5)

## 2010-10-22 LAB — COMPREHENSIVE METABOLIC PANEL
ALT: 22 U/L (ref 0–53)
AST: 26 U/L (ref 0–37)
Albumin: 2.6 g/dL — ABNORMAL LOW (ref 3.5–5.2)
Alkaline Phosphatase: 39 U/L (ref 39–117)
BUN: 16 mg/dL (ref 6–23)
CO2: 32 mEq/L (ref 19–32)
Calcium: 8.3 mg/dL — ABNORMAL LOW (ref 8.4–10.5)
Chloride: 98 mEq/L (ref 96–112)
Creatinine, Ser: 1.29 mg/dL (ref 0.4–1.5)
GFR calc Af Amer: >60 mL/min (ref >60)
GFR calc non Af Amer: >60 mL/min (ref >60)
Glucose, Bld: 121 mg/dL — ABNORMAL HIGH (ref 70–99)
Potassium: 2.9 mEq/L — ABNORMAL LOW (ref 3.5–5.1)
Sodium: 140 mEq/L (ref 135–145)
Total Bilirubin: 1.2 mg/dL (ref 0.3–1.2)
Total Protein: 6.2 g/dL (ref 6.0–8.3)

## 2010-10-22 LAB — GLUCOSE, CAPILLARY
Glucose-Capillary: 131 mg/dL — ABNORMAL HIGH (ref 70–99)
Glucose-Capillary: 124 mg/dL — ABNORMAL HIGH (ref 70–99)
Glucose-Capillary: 140 mg/dL — ABNORMAL HIGH (ref 70–99)
Glucose-Capillary: 126 mg/dL — ABNORMAL HIGH (ref 70–99)
Glucose-Capillary: 98 mg/dL (ref 70–99)

## 2010-10-22 LAB — CBC
HCT: 32.1 % — ABNORMAL LOW (ref 39.0–52.0)
Hemoglobin: 10.6 g/dL — ABNORMAL LOW (ref 13.0–17.0)
MCH: 28.9 pg (ref 26.0–34.0)
MCHC: 33 g/dL (ref 30.0–36.0)
MCV: 87.5 fL (ref 78.0–100.0)
Platelets: 140 10*3/uL — ABNORMAL LOW (ref 150–400)
RBC: 3.67 MIL/uL — ABNORMAL LOW (ref 4.22–5.81)
RDW: 14.6 % (ref 11.5–15.5)
WBC: 5.7 10*3/uL (ref 4.0–10.5)

## 2010-10-22 LAB — CULTURE, BLOOD (ROUTINE X 2)
Culture  Setup Time: 201204190436
Culture: NO GROWTH

## 2010-10-22 LAB — PROTIME-INR
INR: 1.64 — ABNORMAL HIGH (ref 0.00–1.49)
Prothrombin Time: 19.6 seconds — ABNORMAL HIGH (ref 11.6–15.2)

## 2010-10-23 LAB — PROTIME-INR
INR: 1.88 — ABNORMAL HIGH (ref 0.00–1.49)
Prothrombin Time: 21.8 seconds — ABNORMAL HIGH (ref 11.6–15.2)

## 2010-10-23 LAB — GLUCOSE, CAPILLARY
Glucose-Capillary: 132 mg/dL — ABNORMAL HIGH (ref 70–99)
Glucose-Capillary: 125 mg/dL — ABNORMAL HIGH (ref 70–99)
Glucose-Capillary: 104 mg/dL — ABNORMAL HIGH (ref 70–99)

## 2010-10-23 LAB — BASIC METABOLIC PANEL
BUN: 13 mg/dL (ref 6–23)
CO2: 30 mEq/L (ref 19–32)
Calcium: 8.5 mg/dL (ref 8.4–10.5)
Chloride: 101 mEq/L (ref 96–112)
Creatinine, Ser: 1.13 mg/dL (ref 0.4–1.5)
GFR calc Af Amer: >60 mL/min (ref >60)
GFR calc non Af Amer: >60 mL/min (ref >60)
Glucose, Bld: 115 mg/dL — ABNORMAL HIGH (ref 70–99)
Potassium: 3.5 mEq/L (ref 3.5–5.1)
Sodium: 143 mEq/L (ref 135–145)

## 2010-10-27 NOTE — Discharge Summary (Signed)
NAMENELSON, FREDERIQUE             ACCOUNT NO.:  [id number]  MEDICAL RECORD NO.:  [id number]           PATIENT TYPE:  I  LOCATION:  5503                         FACILITY:  [facility]  PHYSICIAN:  Edythe Lynn, M.D.       DATE OF BIRTH:  1966-03-23  DATE OF ADMISSION:  10/15/2010 DATE OF DISCHARGE:                        DISCHARGE SUMMARY - REFERRING   DISCHARGE DIAGNOSES: 1. Hypovolemic shock. 2. Probable septic shock -- still to this day, it is unclear if the     patient had septic shock or not. 3. Acute renal failure, resolved. 4. Massive dehydration. 5. Severe diarrhea of unclear etiology.  Clostridium difficile     negative.  Stool cultures negative. 6. Morbid obesity. 7. Severe depression. 8. Left lower lobe pneumonia. 9. Volume overload and pulmonary edema, status post multiple doses of     intravenous furosemide. 10.Obesity hypoventilation syndrome and obstructive sleep apnea with     chronic respiratory failure -- the patient is refusing CPAP. 11.History of pulmonary embolism and deep vein thrombosis.  The     patient is on chronic Coumadin. 12.Restless legs syndrome. 13.Esophageal reflux disease. 14.History of gout. 15.Hyperlipidemia.  DISCHARGE MEDICATIONS:  Will be dictated at the time of the actual discharge of the patient.  DISPOSITION:  The patient will be transferred back to his skilled nursing home at [facility].  He is do not resuscitate per his own choosing.  CONSULTATIONS DURING THIS ADMISSION: 1. The patient was seen by Pulmonary and Critical Care who admitted     the patient to care for him for 2 days. 2. Urology who placed a coude catheter.  BRIEF HISTORY AND PHYSICAL:  History and physical is available within the chart by Dr. Merrie Roof.  Briefly, Mr. Solanki is a 45 year old gentleman with history of hypertension, morbid obesity, restless legs syndrome, obstructive sleep apnea, who was admitted from a nursing home after he was found to be  severely hypotensive with altered mental status.  Upon his initial admission, he was found to be in severe metabolic and respiratory acidosis with ABG showing a pH of 7.07, pCO2 of 63, pO2 of 120, bicarbonate of 23.  Upon admission, the patient also was found to have acute renal failure with a creatinine of 7.3.  HOSPITAL COURSE:  Shock.  The most likely etiology was profound hypovolemia in the setting of nausea, vomiting, diarrhea, ACE inhibitor use.  There was a consideration for possible severe sepsis and septic shock even though the lactic acid was only 2.3, procalcitonin 0.91.  The reason for this was there was presence of an infiltrate on chest x-ray raising possibility of facility-acquired pneumonia.  For this reason and to be on the conscious side, the patient was admitted to the critical care team, and he was placed on intensive care and had a central line placed in his right internal jugular.  He received intravenous fluids and pressors with Levophed and was started with empiric antibiotics, vancomycin, and Zosyn.  He was then later switched to vancomycin and meropenem.  Eventually, he was kept on meropenem alone and completed 1 week of antibiotics for this facility-acquired pneumonia.  After  aggressive fluid resuscitation and pressors and resolution of the nausea, vomiting, and diarrhea, the patient's renal failure resolved and a repeat base metabolic profile by October 21, 2010 revealed a creatinine of 1.3.  All the blood cultures and the urine culture did not grow any pathogens.  Stool was sent for Clostridium difficile by PCR, and it was found to be negative.  The patient though developed complications related to volume overload, he required repeat doses of diuresis.  He is also having this symptomatic diarrhea which is probably antibiotic induced.  Currently, these are the main things holding him back in the hospital and as soon as the volume overload is resolved and diarrhea  has resolved, he can return back to his skilled nursing home.     Edythe Lynn, M.D.     SL/MEDQ  D:  10/21/2010  T:  10/21/2010  Job:  [id number]  Electronically Signed by Edythe Lynn M.D. on 10/27/2010 09:17:41 PM

## 2010-11-08 NOTE — Discharge Summary (Signed)
[name], [name]             ACCOUNT NO.:  [id number]  MEDICAL RECORD NO.:  [id number]           PATIENT TYPE:  I  LOCATION:  [id number]                         FACILITY:  [facility]  PHYSICIAN:  Oren Binet, MD    DATE OF BIRTH:  [birth year]  DATE OF ADMISSION:  10/15/2010 DATE OF DISCHARGE:  10/23/2010                              DISCHARGE SUMMARY   PRIMARY CARE PHYSICIAN:  Ricard Dillon, MD  DISCHARGE DIAGNOSES: 1. Hypovolemic shock. 2. Rule out septic shock, still unclear if the patient had septic     shock or not. 3. Acute renal failure resolved. 4. Massive dehydration. 5. Severe diarrhea of unclear etiology.  Cultures negative. 6. Left lower lobe pneumonia. 7. Morbid obesity. 8. Severe depression. 9. Volume overload and pulmonary edema status post multiple doses of     IV Lasix. 10.Obesity hypoventilation syndrome with obstructive sleep apnea.  The     patient refuses CPAP or BiPAP. 11.Severe skin irritation of the gluteal and scrotal area secondary to     ascitic diarrhea. 12.Atrophic left kidney. 13.History of pulmonary embolism and deep vein thrombosis on chronic     Coumadin. 14.Restless leg syndrome. 15.Esophageal reflux disease. 16.History of gout. 17.Hyperlipidemia.  DISCHARGE MEDICATIONS: 1. Albuterol 2.5 mg/3 mL nebulized solution every 4 hours as needed     for shortness of breath. 2. Cholestyramine 4 grams by mouth twice daily.  Give 1 hour after or     4-6 hours before other medications. 3. Insulin NovoLog 2-5 units subcutaneously at bedtime. 4. Insulin NovoLog 1-9 units subcutaneously 3 times a day at meals. 5. Loperamide 2 mg by mouth 4 times daily as needed for diarrhea     p.r.n. 6. Multivitamin with minerals 1 daily. 7. Nutritional supplement, liquid Resource peach 240 mL by mouth twice     daily. 8. Ambien 5 mg half tablet by mouth daily at bedtime as needed for     insomnia. 9. Benadryl 25 mg 1 capsule by mouth every 6 hours as needed  for rash     or itching. 10.BuSpar 10 mg 2 tablets by mouth twice daily. 11.Coumadin 1 mg 4-1/2 tablets by mouth daily. 12.Enalapril 5 mg 1 tablet by mouth daily. 13.Citalopram 20 mg 2 capsules by mouth daily. 14.Flexeril 10 mg 1 tablet by mouth daily as needed for pain. 15.Flomax 0.4 mg 1 capsule by mouth daily at bedtime. 16.Keppra 500 mg 1 tablet by mouth twice daily. 17.Lortab 10/500 mg 1 tablet by mouth every 6 hours as needed. 18.Phenergan 25 mg 1 tablet by mouth every 6 hours as needed p.r.n.     vomiting. 19.Prilosec 20 mg 1 capsule by mouth twice daily. 20.Robitussin DM 100/10 per 5 mL of liquid, 10 mL by mouth every 4     hours as needed for cough. 21.Tylenol 325 mg 2 tablets by mouth every 4 hours as needed for pain. 22.Zocor 20 mg 1 tablet by mouth daily at bedtime.  Stop the following medications: 1. Colace 100 mg 1 capsule by mouth twice daily. 2. MiraLax 17 grams by mouth twice daily. 3. MS Contin 30 mg 1 tablet by mouth every  12 hours. 4. Metoclopramide 5 mg by mouth before meals and bedtime. 5. Klonopin 0.5 mg 1 tablet by mouth daily at bedtime. 6. Prescription for Lortab 10/500 mg q.6 h. as needed for pain, 60     tablets will be sent with the patient to the skilled nursing     facility.  There are no refills.  The patient's condition on discharge much improved.  The patient's vitals were normal.  Energy level has increased.  He is feeling much better.  He will be discharged back to the skilled nursing facility at [facility].  He is a DNR.  BRIEF HISTORY AND HOSPITAL COURSE:  [name] is a 45 year old gentleman with history of hypertension, morbid obesity, and restless legs syndrome as well as obstructive sleep apnea was admitted from his skilled nursing facility and found to be severely hypotensive with altered mental status.  On admission, he was in severe metabolic and respiratory acidosis with an ABG showing a pH of 7.07, pCO2 was 63, and pO2 of 120,  bicarb of 23.  On admission, the patient was also found to have acute renal failure with a creatinine of 7.3.  The patient was in shock, most likely etiology was profound hypovolemia in the setting of nausea, vomiting, and diarrhea as well as ACE inhibitor use.  There was also consideration for possible severe sepsis and septic shock even though his lactic acid was only 2.3 as procalcitonin was 0.91.  The reason for this was there was a presence of an infiltrate on chest x-ray raising the possibility of facility-acquired pneumonia.  For this reason, the patient was admitted to the critical care team and was placed in intensive care.  He had a central line placed in his right internal jugular.  He received IV fluids, pressors with Levophed and was started on empiric antibiotics including vancomycin and Zosyn.  He was later switched to vancomycin and meropenem.  Eventually he was kept on meropenem alone and completed 1-week course of antibiotics for facility- acquired pneumonia.  After aggressive fluid resuscitation and pressors, the patient's hypovolemia and shock resolved as did his nausea and vomiting and acute renal failure.  However, his diarrhea persisted.  It was dark green in color and so profound that a rectocele had to be placed.  Antibiotics were stopped.  C. diff was drawn and found to be negative.  The patient was placed on Imodium which slowed the diarrhea and then on Questran which helped thicken it slightly.  Today he is still having approximately 4 liquid bowel movements in a 24-hour period. However, this is much improved over the last several days.  All blood cultures and urine cultures during his hospitalization did not grow any pathogens.  The patient did develop complications related to volume overload and required repeat doses of diuresis.  He was recommended to use initially BiPAP and then CPAP, however, he refused repeatedly. Because of the contact of the ascitic  diarrhea with his gluteal area, the patient developed severe skin irritation and excoriations on his gluteal and scrotal area necessitating a wound consult.  Recommendations were made and now as the diarrhea is increasing, barrier cream is being used and the patient's skin is improving.  PHYSICAL EXAMINATION ON DISCHARGE:  VITAL SIGNS:  [name] vital signs this morning are 98.6, pulse 53, respirations 21, blood pressure 166/81. GENERAL:  He is more energetic this morning speaking to me more.  He appears much improved. HEENT:  Head is atraumatic, normocephalic.  Eyes are  anicteric.  Pupils are equal and round.  Nose shows no nasal discharge or exterior lesions. Mouth has moist mucous membranes with moderate dentition. NECK:  Supple with midline trachea.  No JVD.  No lymphadenopathy. CHEST:  Demonstrates minimal accessory muscle use, much improved over previous days.  The patient maintains an O2 sat of approximately 92% on 2 liters of oxygen.  His chest is clear to auscultation without wheezes, crackles, or rales. HEART:  Has a regular rate and rhythm without obvious murmurs, rubs, or gallops.  He is at times slightly bradycardic. ABDOMEN:  Obese, nontender.  He does have bowel sounds.  I am unable to appreciate any organomegaly secondary to his body habitus. EXTREMITIES:  His lower extremities are slightly edematous and showed changes of venous stasis.  However, he currently has no wounds. SKIN:  He has skin breakdown secondary to diarrhea, irritation, and excoriation in his gluteal and scrotal areas.  This was quite severe, however, it has improved over the course of his hospitalization. NEURO:  Cranial nerves II-XII are grossly intact.  The patient has no focal neuro deficits. PSYCHIATRIC:  The patient's affect during this hospitalization has been flat, however, today he is smiling more and his mood appears improved. He is 45 years old and is a DNR living in a skilled nursing  facility, has been diagnosed depression and is on antidepressants for this. Overall psychiatric status appears stable.  PERTINENT LABS:  This hospitalization include an initial ABG that showed pH of 7.1, pCO2 63.  White count on admission was 11.8, hemoglobin 12.8, hematocrit 39.2, platelets 181,000.  PT 24.3, INR 2.17.  Sodium 129, potassium 4.0, chloride 100, bicarb 18, glucose 137, BUN 39, creatinine 7.58, procalcitonin 2.3.  Urinalysis did not show signs of infection. He was found to be MRSA positive by PCR.  Blood culture showed no growth after 5 days.  Urine culture drawn April 20 showed no growth.  On April 24, he had a BNP drawn that was 978.  On the date of discharge April 26, white count had normalized.  PT 21.8, INR 1.88.  Glucose 132.  Sodium 143, potassium 3.5, chloride 101, bicarb 30, glucose 115, BUN 13, creatinine 1.13.  RADIOLOGICAL EXAM:  On admission, the patient had a 2-view x-ray of his chest that showed hypoventilation and bibasilar airspace disease, possible pneumonia.  On April 18, he had a renal ultrasound for his acute renal failure that showed normal appearance of his right kidney and bladder, however, his left kidney was not visualized.  Left kidney is too hypoplastic and atrophic to be seen.  On April 24, he had a followup chest x-ray that showed cardiomegaly with mild pulmonary edema.  DISCHARGE INSTRUCTIONS:  The patient is returning to a skilled nursing facility and will resume his previous diet.  His potassium does need to be followed as it has been low during this hospitalization and is just now within normal limits, especially given his ongoing diarrhea.  Also his p.o. intake needs to be monitored closely.  At this point, the patient is taking very little in, he prefers popsicles and clear liquids.  He was put on a p.o. nutritional supplement Resource breeze because of this.     Melton Alar, PA   ______________________________ Oren Binet, MD    MLY/MEDQ  D:  10/23/2010  T:  10/23/2010  Job:  [id number]  cc:   Ricard Dillon, M.D.  Electronically Signed by Imogene Burn PA on 10/31/2010 11:39:42 AM Electronically Signed by  [facility] GHIMIRE  on 11/08/2010 12:28:43 PM

## 2010-11-11 NOTE — Discharge Summary (Signed)
[name], [name]             ACCOUNT NO.:  [id number]   MEDICAL RECORD NO.:  [id number]          PATIENT TYPE:  INP   LOCATION:  [id number]                          FACILITY:  [facility]   PHYSICIAN:  Bonnielee Haff, MD     DATE OF BIRTH:  Feb 12, 1966   DATE OF ADMISSION:  12/26/2008  DATE OF DISCHARGE:  07/16/2010LH                               DISCHARGE SUMMARY   ADDENDUM:   PRIMARY CARE PHYSICIAN:  Dr. Velvet Bathe.  Please review discharge  summaries dictated by him on January 07, 2009 as well January 10, 2009.   Briefly, this is a 45 year old Caucasian male who presented to the  hospital with seizures.  He also had left-sided weakness.  It was felt  that his weakness was related to his seizure activity.  The patient  underwent a CT of the head which was negative for any acute process.  A  chest x-ray showed stable cardiomegaly.  He subsequently also had an MRI  of the brain which was normal and MRA did not show any significant  stenosis.  The patient was also seen by neurologist, Dr. Merlene Laughter.  He  is requiring skilled nursing facility placement for rehabilitation.   He also developed acute gout in his right index finger, which is also  getting better.  He was also experiencing nausea, which was felt to be  because of gastroparesis, that has improved with Reglan.  For his  seizures, the dose of Keppra was increased.   Today, the patient is feeling well.  He denies any complaints.  He is  keen to go to skilled nursing facility.   VITAL SIGNS:  His temperature is 97.  Heart rate is in the 40s.  Respiratory rate 18.  Saturation 97% on 2 L.  Blood pressure 99/58.  GENERAL APPEARANCE:  He is a morbidly obese white male in no distress.  HEENT:  No pallor.  No icterus.  Oral mucous membranes are moist.  LUNGS: Clear to auscultation bilaterally.  No wheezing, rales or  rhonchi.  CARDIOVASCULAR:  S1 and S2, bradycardic.  No murmurs appreciated.  No S3  or S4.  No rubs.  No bruits.  ABDOMEN:   Obese and soft.  EXTREMITIES:  No edema.   No labs have been done this morning.   Essentially, this is a gentleman who presented with seizures and  developed acute gout in the right index finger and some nausea.  All  those issues are quite stable.  His heart rate is still running low,  although he remains asymptomatic.  He is on Inderal for migraine  prophylaxis.  Of note, this dose was decreased by Cardiology back in  February; however, he continues to take 80 mg.  I am going to go ahead  and decrease the dose further to ensure that he does not become  significantly and symptomatically bradycardic.   He also has a history of sleep apnea for which he is on CPAP, which  should also be continued at the skilled nursing facility.   DISCHARGE MEDICATIONS:  1. Flomax 0.4 mg daily.  2. Enalapril 5 mg daily.  3. Colchicine 0.6 mg b.i.d.  4. Prozac 40 mg daily.  5. Omeprazole 20 mg t.i.d.  6. Klonopin 3 mg at bedtime daily.  7. Duragesic patch 75 mcg topically every 3 days.  8. Hydrocodone/acetaminophen 10/325 one tablet every 6 hours as needed      for pain.  9. Reglan 5 mg q.a.c. and nightly.  10.Flonase nasal spray 2 sprays each nostril daily.  11.Keppra 500 mg every morning and 1,000 mg every evening.  12.Inderal LA 40 mg daily.  13.Prednisone taper 20 mg daily for 5 days and then 10 mg daily 5 days      and then stop.  14.Uloric 80 mg daily.  15.Zofran 4 mg p.o. every 4 hours as needed for nausea.   DIET:  Modified carbohydrate.   PHYSICAL ACTIVITY:  Per rehab.   FOLLOWUP:  Please schedule appointment with Dr. Luan Pulling in 2-3 weeks.   TOTAL TIME ON THIS ENCOUNTER:  Thirty-five minutes.      Bonnielee Haff, MD  Electronically Signed     GK/MEDQ  D:  01/11/2009  T:  01/11/2009  Job:  [id number]   cc:   Percell Miller L. Luan Pulling, M.D.  Fax: [place]. Merlene Laughter, M.D.  Fax: [phone]

## 2010-11-11 NOTE — Group Therapy Note (Signed)
NAMEPATTI, BRANNIGAN             ACCOUNT NO.:  [id number]   MEDICAL RECORD NO.:  [id number]          PATIENT TYPE:  INP   LOCATION:  A318                          FACILITY:  [facility]   PHYSICIAN:  Kofi A. Merlene Laughter, M.D. DATE OF BIRTH:  January 01, 1966   DATE OF PROCEDURE:  DATE OF DISCHARGE:                                 PROGRESS NOTE   The patient is working at his computer.  No new complaints are reported.  He continues to report left-sided weakness.  He actually seemed to have  improved strength today, however, seemed to move more spontaneously.  His stuttering dysarthria seems improved.  He is afebrile, 97.0 with  pulse on the low side 48, respirations 20, and blood pressure 123/68.   ASSESSMENT AND PLAN:  Persistent left hemiparesis with stuttering  dysarthria.  MRI is pending for tomorrow.      Kofi A. Merlene Laughter, M.D.  Electronically Signed     KAD/MEDQ  D:  01/01/2009  T:  01/02/2009  Job:  [id number]

## 2010-11-11 NOTE — Group Therapy Note (Signed)
NAMEVASILI, JANAK             ACCOUNT NO.:  [id number]   MEDICAL RECORD NO.:  [id number]          PATIENT TYPE:  INP   LOCATION:  A309                          FACILITY:  [facility]   PHYSICIAN:  Kofi A. Merlene Laughter, M.D. DATE OF BIRTH:  05-04-1966   DATE OF PROCEDURE:  DATE OF DISCHARGE:                                 PROGRESS NOTE   The patient apparently continues to have headaches.  No spells are  reported.  Temperature is 97, pulse 53, respirations 18, and blood  pressure 124/76.  The patient is awake, alert, and converses well.  No  dysarthria or dysphagia is noted.  Cognition is unremarkable.  He moves  all 4 extremities.   ASSESSMENT AND PLAN:  1. Spells, thought to be nonepileptic.  2. Headaches, unclear etiology.  3. Blurry vision, an Ophthalmology consult is pending.      Kofi A. Merlene Laughter, M.D.  Electronically Signed     KAD/MEDQ  D:  07/23/2008  T:  07/23/2008  Job:  [id number]

## 2010-11-11 NOTE — Consult Note (Signed)
[name], [name]             ACCOUNT NO.:  [id number]   MEDICAL RECORD NO.:  [id number]          PATIENT TYPE:  INP   LOCATION:  A309                          FACILITY:  [facility]   PHYSICIAN:  Cristopher Estimable. Lattie Haw, MD, FACCDATE OF BIRTH:  07-28-1965   DATE OF CONSULTATION:  07/20/2008  DATE OF DISCHARGE:                                 CONSULTATION   REFERRING PHYSICIAN:  Dr. Percell Miller L. Hawkins.   CARDIOLOGIST:  He will be new to Dr. Jacqulyn Ducking.   PRIMARY CARE PHYSICIAN:  Dr. Percell Miller L. Hawkins.   NEPHROLOGIST:  Dr. Lorrene Reid at [facility] in [place].   REASON FOR CONSULTATION:  Chest pain.   HISTORY OF PRESENT ILLNESS:  [name] is a 45 year old male patient  with a history of diabetes mellitus and hypertension, who was admitted  in early January 2010, with questionable pseudoseizures versus seizure  activity and possible conversion disorder.  He presented back to [facility] on July 17, 2008, with lower extremity edema and chest  pain.  Chest CT angiogram was limited, but overall negative for  pulmonary embolism.  He is now being treated for lower extremity  cellulitis with vancomycin.  He developed chest pain yet again last  night and we are asked to further evaluate.  He is not very active  secondary to disability from gout and osteoarthritis.  He notes chest  pain at rest pain and with exertion.  It is not necessarily brought on  by exertion.  He described it as a sharp substernal pressure with  radiation to his arms and neck with associated shortness of breath,  nausea and diaphoresis.  He received nitroglycerin in the emergency room  on July 17, 2008, with relief.  He is currently pain free.  He has  had one set of cardiac markers since he has been admitted that were  negative.   PAST MEDICAL HISTORY:  1. Hypertension.  2. Diabetes mellitus - diet therapy.  3. History of nonfunctioning left kidney.  4. Renal insufficiency.  5. History of  renal insufficiency.  6. Sleep apnea on CPAP.  7. Depression.  8. Neuropathy.  9. BPH.  10.Gout.  11.Migraine headaches.  12.Attention deficit disorder.  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  15.Chronic pain syndrome.   MEDICATIONS AT HOME:  1. Clonazepam 3 mg nightly.  2. Propranolol 60 mg nightly.  3. Prozac 20 mg nightly.  4. Neurontin 300 mg three times.  5. Prednisone 10 mg daily.  6. Celebrex 200 mg daily.  7. Endocet q.6 h., p.r.n.  8. Fentanyl patch 25 mcg q.72 h.  9. Flomax 0.4 mg daily.  10.Nexium 40 mg daily.  11.Uloric 40 mg daily.  12.Concerta 123XX123 mg daily.   ALLERGIES:  1. PENICILLIN.  2. CONTRAST MEDIA.  3. SHELLFISH.   SOCIAL HISTORY:  The patient lives in [place] with his son.  He is  divorced.  He smoked for a couple of years, 20 years ago.  He denies  alcohol abuse.  He is currently disabled.  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  His mother died from  liver cancer.   REVIEW OF SYSTEMS:  Please see HPI.  He denies fevers, but has had  chills and headaches.  He has had erythema to his bilateral lower  extremities.  He denies orthopnea or paroxysmal nocturnal dyspnea.  He  denies palpitations.  He has had a nonproductive cough.  He denies any  dysuria, hematuria, bright red blood per rectum or melena, hematemesis,  dysphagia or odynophagia.  The rest of the review of systems are  negative.   PHYSICAL EXAMINATION:  GENERAL:  He is a well-nourished, well-developed  obese male in no acute distress.  VITAL SIGNS:  Blood pressure is 145/72, pulse 79, respirations 16,  temperature 98.5.  HEENT:  Normal.  NECK:  Without JVD.  LYMPHS:  Without lymphadenopathy.  ENDOCRINE:  Without thyromegaly.  CARDIAC:  Normal S1-S2.  Regular rate and rhythm with early systolic  murmur noted.  LUNGS:  Clear to auscultation bilaterally without wheezing,  rhonchi or  rales.  SKIN:  Without rash.  ABDOMEN:  Soft, nontender with normoactive bowel sounds.  No  organomegaly.  EXTREMITIES:  With 1-2+ edema bilaterally with mild erythema noted.  VASCULAR:  Without carotid bruits bilaterally.  Dorsalis pedis and  posterior tibialis pulses are 1+ bilaterally.  MUSCULOSKELETAL:  Without joint deformity.  NEUROLOGIC:  He is alert and oriented x3.  Cranial nerves II-XII are  grossly intact.   Telemetry demonstrates normal sinus rhythm with one short run of  nonsustained ventricle tachycardia.  Chest CT angiogram is as outlined  above.  Chest x-ray:  Status post PICC line placement, notable for  cardiomegaly but no CHF.  No pneumothorax.  Bilateral venous Dopplers  negative for DVT.  EKG:  Sinus bradycardia with a heart rate of 51,  normal axis, no acute changes.   LABORATORY DATA:  White count 6100, hemoglobin 11.1, hematocrit 33.9,  platelet count 179,000, potassium 4.1, creatinine 1.06, glucose 139.  Point of care CK-MB and troponin negative on July 17, 2008.  BNP 95.  D-dimer 0.34, INR 1.1.   ASSESSMENT:  1. Chest pain.  2. Diabetes mellitus.  3. Hypertension.  4. Nonfunctioning left kidney and history of renal insufficiency with      overall normal creatinine at this point in time.  5. Sleep apnea on continuous positive airway pressure.  6. Lower extremity edema/cellulitis.  7. Mild normocytic anemia.  8. Questionable history of seizure disorder.  9. Depression.  10.Morbid obesity.   RECOMMENDATIONS:  [name] presents with chest pain with fairly  worrisome features.  He has significant cardiac risk factors, including  diabetes mellitus and hypertension.  Serial enzymes will be checked to  rule out acute coronary syndrome.  Electrocardiogram will also be  obtained to rule out acute changes.  An echocardiogram will also be  obtained to assess his LV function and structure.  With his size, he is  likely not able to do a nuclear  study with good results.  If he has good  windows on his echocardiogram, we could certainly consider proceeding  with a dobutamine stress echo to rule out the possibility of ischemia.  Cardiac catheterization would be somewhat risky with his history of a  nonfunctioning left kidney and history of renal insufficiency in the  past.  He also has a history of DYE ALLERGY.  We will add amlodipine to  his therapy to treat his hypertension and also  cover for  angina/esophageal spasm.  Lipids will be obtained to risk stratify.  With his diabetes mellitus, his goal LDL would be less than or equal to  70 at this time.  He does describe some symptoms that are somewhat  concerning for congestive heart failure.  Although, his chest x-ray was  negative for edema and his BNP was normal.  Low-dose Lasix could be used  to help control his also extremity edema.  Hopefully, amlodipine will  not make this any worse.   Thank you very much for consultation.  We will be glad to follow the  patient throughout the remainder of this admission.  Further  recommendations to follow.      Patrick Dopp, [name]      Cristopher Estimable. Lattie Haw, MD, [facility]  Electronically Signed   SW/MEDQ  D:  07/20/2008  T:  07/20/2008  Job:  [id number]   cc:   Percell Miller L. Luan Pulling, M.D.  Fax: [phone]

## 2010-11-11 NOTE — Group Therapy Note (Signed)
NAMEJAYMON, [name]             ACCOUNT NO.:  [id number]   MEDICAL RECORD NO.:  [id number]          PATIENT TYPE:  OBV   LOCATION:  [id number]                          FACILITY:  [facility]   PHYSICIAN:  Audria Nine, M.D.DATE OF BIRTH:  1966-05-06   DATE OF PROCEDURE:  09/13/2007  DATE OF DISCHARGE:                                 PROGRESS NOTE   SUBJECTIVE:  The patient continues to have severe pain in his right  knee, describes the pain as 10/10.  Attempts to do physical therapy on  him today were not very successful as patient complained of significant  pain and refused to get out of bed.   I explained to him the importance of having to move around but would try  to adjust his medications.   OBJECTIVE:  GENERAL:  Conscious, alert, comfortable.  Appears to be in  some painful distress.  VITAL SIGNS:  Blood pressure 149/90 with a pulse of 67, respirations 20,  temperature 98.2 degrees Fahrenheit.  HEENT:  Normocephalic and atraumatic.  Oral mucosa moist with no  exudate.  NECK:  Supple.  No JVD or lymphadenopathy.  LUNGS:  Clear clinically with good air entry bilaterally.  HEART:  S1, S2 regular.  No S3, S4, gallops, or rubs.  ABDOMEN:  Obese but soft, nontender.  Bowel sounds were positive.  EXTREMITIES:  The patient has some swelling and tenderness in the right  knee with no erythema noted.  The patient likely will have an effusion  there.   LABORATORY AND DIAGNOSTIC DATA:  White blood cell count 11.7, hemoglobin  13.5, hematocrit 38.8, platelet count 401 with no left shift.  Sodium  136, potassium 4.1, chloride 103, CO2 27, glucose 118, BUN 18,  creatinine 1.04.   ASSESSMENT AND PLAN:  1. Acute gouty arthritis involving the right knee.  The patient is      currently on prednisone.  Will adjust patient's Dilaudid as needed.      The patient has a history of chronic renal insufficiency, although      his BUN and creatinine are normal at this time.  We are still      avoiding  the use of nonsteroidal anti-inflammatory agents in him.      Dilaudid will be increased to 3 mg IV q. 3 h.  I have also told him      that he needs to make sure that he goes with physical therapy as      his recovery will depend on that.  I also discussed with him the      down side of nonambulation in the hospital.  2. Chronic renal insufficiency.  The patient's BUN and creatinine are      normal at this time.  I do not think this is of concern but would      avoid any nephrotoxic medications.  3. Migraines.  The patient is stable.  The patient is on propranolol.      His heart rate actually is slightly improved today.  He has no      migraine symptoms.   DISPOSITION:  We will  try to control the patient's pain.  We will try to  get him out of bed and ambulate him a little bit more.  I do anticipate  he can be discharged in the next 1-2 days.      Audria Nine, M.D.  Electronically Signed     AM/MEDQ  D:  09/13/2007  T:  09/13/2007  Job:  [id number]

## 2010-11-11 NOTE — Group Therapy Note (Signed)
NAMEDEMON, HOTOP             ACCOUNT NO.:  [id number]   MEDICAL RECORD NO.:  [id number]          PATIENT TYPE:  INP   LOCATION:  A309                          FACILITY:  [facility]   PHYSICIAN:  Edward L. Luan Pulling, M.D.DATE OF BIRTH:  1965/10/21   DATE OF PROCEDURE:  07/21/2008  DATE OF DISCHARGE:                                 PROGRESS NOTE   Mr. Sonner had chest discomfort as noted yesterday.  I had Cardiology  from [facility] see him and thus far he has negative cardiac  panel.  He was concerned because there were some worrisome features of  his chest discomfort, but so far he is ruled out for infarction.  He has  no other new complaints right now.  He has had no more chest pain.  He  did get up and walk around, but his O2 sat went down in the 80s when he  was walking.  He has no other new complaints.   His physical examination shows that he is a morbidly obese male who is  in no acute distress now.  Temperature is 97.9, pulse 58, respirations  20, blood pressure 115/58, O2 sats 92% on 2 liters.  Blood sugars 93.  His chest is a little clear.  He has a little bit less edema than  before.   ASSESSMENT:  He has had an episode of chest discomfort that is worrisome  at least for cardiac pain.  He did have an echocardiogram that does not  show any regional wall motion abnormalities.  His cardiac enzymes thus  far are negative.  He has cellulitis of the legs.  He is on vancomycin  that looks a little bit better.  He has bilateral leg edema and Dr.  Lattie Haw has agreed that we can use low-dose Lasix and I have ordered  that.  He is probably going to need oxygen at home as well.  Everything  else is about the same.  His diabetes is pretty well controlled and his  pain seems a little bit better now that he has had an increase in the  dose of his fentanyl patch.      Edward L. Luan Pulling, M.D.  Electronically Signed     ELH/MEDQ  D:  07/21/2008  T:  07/21/2008  Job:  [id number]

## 2010-11-11 NOTE — Group Therapy Note (Signed)
[name], [name]             ACCOUNT NO.:  [id number]   MEDICAL RECORD NO.:  [id number]          PATIENT TYPE:  OBV   LOCATION:  A306                          FACILITY:  [facility]   PHYSICIAN:  Edward L. Luan Pulling, M.D.DATE OF BIRTH:  1965/08/10   DATE OF PROCEDURE:  DATE OF DISCHARGE:                                 PROGRESS NOTE   [name] seems to be doing a little bit better.  He continues to be  with some chest pain.  He is, otherwise, doing about the same.  His  renal function has improved, so that looks better.  He has no new  complaints.   PHYSICAL EXAMINATION:  GENERAL:  An obese male who is in no acute  distress.  CHEST:  Clear.  HEART:  Regular.  ABDOMEN:  Soft.   LABORATORY WORK:  His renal function is better.  His potassium is  normal.   His vital signs are as recorded.  His abdomen is soft and his legs are  still somewhat swollen.   He and I discussed the fact that unfortunately with his severe problems  with obesity and with his legs, and with his renal function, I think he  simply going to have to put up with a certain amount of leg swelling and  we are not going to be able to treat that as effectively, because he  cannot take diuretics because it causes him to have increased renal  failure.  My plan then he is going to have a barium pill esophagogram as  suggested by Dr. Gala [name].  He is going to continue with all of his other  treatments and he is going to continue off of his diuresis, etc.  I  think he needs to have gastric bypass surgery at some point.      Edward L. Luan Pulling, M.D.  Electronically Signed     ELH/MEDQ  D:  12/08/2008  T:  12/09/2008  Job:  [id number]

## 2010-11-11 NOTE — H&P (Signed)
NAMEGERRELL, [name]             ACCOUNT NO.:  [id number]   MEDICAL RECORD NO.:  [id number]          PATIENT TYPE:  INP   LOCATION:  A309                          FACILITY:  [facility]   PHYSICIAN:  Angus G. Everette Rank, MD   DATE OF BIRTH:  06-07-1966   DATE OF ADMISSION:  11/25/2008  DATE OF DISCHARGE:  LH                              HISTORY & PHYSICAL   A 45 year old white male, who presented to the ED with the chief  complaint being anterior chest pain and this was in midportion of his  chest without radiation.  He apparently had been evaluated previously  for chest pain.  He is morbidly obese.  He had been seen by Cardiology,  Dr. Lattie Haw.  He had had a stress echocardiogram, which showed an  ejection fraction of 60%.  Stress echo, it has no ischemia.  Pain was  similar to that, which he had had previously.  He had taken several  nitroglycerins, but this did not seem to relieve his symptoms  immediately.  Pertinent laboratory test did show the cardiac marker,  myoglobin 384, troponin less than 0.05, CK-MB 1.4.  A chest x-ray was  reported just being negative.  He was also noted that he had  abnormalities on his chemistries.  His potassium was 2.8, creatinine  2.05, and GFR 36.  Because of his hypokalemia, he was given potassium  orally in the ED and subsequently was admitted to Dr. Luan Pulling' service.   SOCIAL HISTORY:  The patient does not drink or use drugs.  He is a  former cigarette smoker.   PAST MEDICAL HISTORY:  The patient has history of insulin-dependent  diabetes, arthritis, hyperuricemia, gout, morbid obesity, peripheral  neuropathy, prostatic hypertrophy, renal insufficiency, and history of  seizure disorder.   SURGICAL HISTORY:  Cholecystectomy, lens implantation, TIA, previous  stress echocardiogram, and cardiology evaluation.   ALLERGIES:  LATEX, PENICILLIN, IVP DYE, and IODINE.   MEDICATION LIST:  1. Clonazepam 3 mg daily at bedtime.  2. Propranolol 160 mg at  bedtime.  3. Prozac 40 mg at bedtime.  4. Neurontin 300 mg t.i.d.  5. Prednisone 10 mg daily.  6. Endocet 10/325 q.4 h. p.r.n.  7. Fentanyl patch 75 mcg every 3 days.  8. Nexium 40 mg daily.  9. Flomax 0.4 mg daily.  10.Colchicine 0.2 mg daily.  11.Norvasc 5 mg daily.  12.P.r.n. nitroglycerin 0.4 mg.   REVIEW OF SYSTEMS:  HEENT:  Negative.  CARDIOPULMONARY:  The patient has  had dyspnea and anterior chest pain.  This is characterized mid chest  pain with no radiation.  GI:  No bowel irregularity or bleeding.  GU:  No dysuria or hematuria.   PHYSICAL EXAMINATION:  GENERAL:  Alert white male with blood pressure on  admission being 140/70, respiration 20, pulse 61, and temp 97.8.  HEENT:  Eyes, PERRLA.  TMs are negative.  Oropharynx is benign.  NECK:  Supple.  No JVD or thyroid abnormalities.  HEART:  Regular rhythm.  No murmurs.  No cardiomegaly.  LUNGS:  Clear to P and A.  ABDOMEN:  Obese and nontender.  No  organomegaly palpable.  EXTREMITIES:  Free of edema.   DIAGNOSES:  1. Anterior chest pain of undetermined etiology.  2. Insulin-dependent diabetes.  3. Hyperuricemia.  4. Morbid obesity.  5. Diabetic neuropathy.  6. Renal insufficiency.  7. Questionable seizure disorder.      Angus G. Everette Rank, MD  Electronically Signed     AGM/MEDQ  D:  11/25/2008  T:  11/26/2008  Job:  [id number]

## 2010-11-11 NOTE — Group Therapy Note (Signed)
[name], [name]             ACCOUNT NO.:  [id number]   MEDICAL RECORD NO.:  [id number]          PATIENT TYPE:  INP   LOCATION:  A309                          FACILITY:  [facility]   PHYSICIAN:  Edward L. Luan Pulling, M.D.DATE OF BIRTH:  [birth year]   DATE OF PROCEDURE:  DATE OF DISCHARGE:                                 PROGRESS NOTE   [name] was admitted with chest discomfort.  He has a long known  history of episodes of chest discomfort, which eventually culminated in  him having a cardiac catheterization in February of this year.  His  cardiac catheterization was without any evidence of significant  obstructive coronary artery disease.  He came to the emergency room with  chest pain.  He was found to be hypokalemic.  He took nitroglycerin for  the chest pain, but it did not make much difference.   PHYSICAL EXAMINATION:  GENERAL:  This morning, he is of course morbidly  obese.  His chest is pretty clear and he says he feels better.  VITAL SIGNS:  His temperature is 98, pulse 47, respirations 18, blood  pressure 104/52, and O2 sats 94% on 4 L.  CHEST:  Clearer than before.  HEART:  Regular.  ABDOMEN:  Soft.  EXTREMITIES:  Edema bilaterally.   ASSESSMENT:  He is got chest discomfort.  In the past, we worked him up  for pulmonary emboli, for coronary artery disease, all of which have  been negative so far.   So my plan is to go ahead and check a venous study.  Replete his  potassium.  Continue all of his other treatments.  Repeat BNP in the  morning and follow.      Edward L. Luan Pulling, M.D.  Electronically Signed     ELH/MEDQ  D:  11/26/2008  T:  11/27/2008  Job:  [id number]

## 2010-11-11 NOTE — Discharge Summary (Signed)
[name], [name]             ACCOUNT NO.:  [id number]   MEDICAL RECORD NO.:  [id number]          PATIENT TYPE:  INP   LOCATION:  A226                          FACILITY:  [facility]   PHYSICIAN:  Anselmo Pickler, DO    DATE OF BIRTH:  1966-05-16   DATE OF ADMISSION:  07/03/2008  DATE OF DISCHARGE:  01/07/2010LH                               DISCHARGE SUMMARY   PRIMARY CARE PHYSICIAN:  Dr. Anitra Lauth.   ADMITTING DIAGNOSES:  1. Seizure.  2. Nonverbal state.  3. History of renal insufficiency.  4. History of gout.  5. History of chronic pain.  6. History of migraines.  7. History of depression.   DISCHARGE DIAGNOSES:  1. Seizure, unclear etiology, possible pseudoseizures.  2. Nonverbal state, resolved.  3. History of renal insufficiency.  4. History of gout.  5. History of chronic pain.  6. History of migraines.  7. History of depression.   TESTS DONE:  Includes CT of head without contrast which demonstrated no  acute ________ abnormalities.  Also a CT of the head (sic) on January 6  which demonstrated stable cardiomegaly with ________ , no acute  cardiopulmonary disease.  The patient also had an EEG done and the  report is not on the system yet.  However, per Dr. Merlene Laughter today his  EEG is negative.   HISTORY OF PRESENT ILLNESS:  Please refer to the history of presenting  illness from Dr. Linus Salmons but to summarize the patient was admitted to the  service of [facility] for a possible seizure like activity and further  testing was done.  Dr. Merlene Laughter was consulted.  The patient would not  talk.  There was some question as to whether or not this was a  conversion disorder so we had the ACT team come and see him.  The next  day the patient was talking somewhat better.  At that point in time it  was determined that he have MRI done per Dr. Freddie Apley instruction.  It  was determined that study could not be done at [facility] and would have  to be done at [facility].  Dr. Merlene Laughter felt  that with the patient's  symptoms improving and resolving on January 7 that he could be arranged  for outpatient MRI and would not change his medications.  So it was felt  that the patient could be discharged today and he will be discharged  with his current vitals as temperature 99.9, pulse 48, respirations 19,  blood pressure 120/65.  Generally he is well-developed, well-nourished  and in no acute distress.  Heart regular rate and rhythm.  Lungs clear  to auscultation bilaterally.  Abdomen soft, nontender, nondistended.  Extremities positive pulses.  Labs that were done include, there were no  labs for today.  New medications that he was sent home on include:   MEDICATIONS:  1. Celebrex 200 mg p.o. daily.  2. Clonazepam 3 mg at bedtime.  3. Propranolol 60 mg at bedtime.  4. Endocet 10/325 q.6 hours as needed.  5. Fentanyl 25 mcg patch q.72 hours.  6. Flomax 0.4 mg daily.  7.  Neurontin 300 mg 3 times a day.  8. Nexium 40 mg daily.  9. Prednisone 10 mg daily.  10.Prozac 20 mg daily.   DISCHARGE INSTRUCTIONS:  1. Increase activity slowly and continue to walk with assistance.  He      was seen by physical therapy who recommended that he use a walker.  2. He is to use a low sodium heart healthy diet.  3. He is to see Dr. Merlene Laughter in 2 weeks.  4. To follow up with the primary care physician on call.  We      originally set him up with Dr. Anitra Lauth but it was found that he was      discharged from his practice so we will go ahead and set him up      with the PCP on call who is Dr. Luan Pulling.  He has an appointment      with Dr. Merlene Laughter January 19 at 12:30 and his appointment with Dr.      Luan Pulling is January 21 at 3:00.   The patient's condition was stable and he was discharged to home.      Anselmo Pickler, DO  Electronically Signed     CB/MEDQ  D:  07/05/2008  T:  07/05/2008  Job:  [id number]   cc:   Tammi Sou, MD  Fax: [phone]

## 2010-11-11 NOTE — Group Therapy Note (Signed)
[name], [name]             ACCOUNT NO.:  [id number]   MEDICAL RECORD NO.:  [id number]          PATIENT TYPE:  INP   LOCATION:  IC01                          FACILITY:  [facility]   PHYSICIAN:  Edward L. Luan Pulling, M.D.DATE OF BIRTH:  1966-04-02   DATE OF PROCEDURE:  12/06/2008  DATE OF DISCHARGE:                                 PROGRESS NOTE   [name] is, I think, a little better.  He complained of nausea last  night.  He says he has had more chest pain last night and he is having  more problems in his leg, being painful.   PHYSICAL EXAMINATION:  VITAL SIGNS:  This morning shows blood pressure  is 99/50, temperature is 97.4, pulse is 53, respirations 10, and O2 sats  97% on 3 liters.  CHEST:  Clear.  HEART:  Regular without gallop.  ABDOMEN:  Soft.  EXTREMITIES:  He has very mild erythema of his legs.   His last BUN was 50, creatinine 2.14.  He has a BMET scheduled for this  morning, it has not returned as yet to see what his potassium is.   ASSESSMENT:  He has congestive heart failure.  He has chest pain.  He  has multiple other medical problems.  He has morbid obesity.  He has  probably some cellulitis in his leg and because he has noncardiac chest  pain, I am going to go ahead and ask for GI consultation to see if they  have any other thoughts about what we might be able to do as far as his  chest discomfort.  He understands and agrees with the plan.      Edward L. Luan Pulling, M.D.  Electronically Signed     ELH/MEDQ  D:  12/06/2008  T:  12/06/2008  Job:  [id number]

## 2010-11-11 NOTE — Group Therapy Note (Signed)
[name], [name]             ACCOUNT NO.:  [id number]   MEDICAL RECORD NO.:  [id number]          PATIENT TYPE:  INP   LOCATION:  A226                          FACILITY:  [facility]   PHYSICIAN:  Kofi A. Merlene Laughter, M.D. DATE OF BIRTH:  September 05, 1965   DATE OF PROCEDURE:  DATE OF DISCHARGE:                                 PROGRESS NOTE   HISTORY:  The patient reports that his speech is actually improved.  However, he does complain of numbness in his legs and feet.  Today, he  is awake and alert.  He comprehends fairly well.  His speech has  improved significantly.  He is talking some, although there is some  childish bettor component to his speech.   PHYSICAL EXAMINATION:  VITAL SIGNS:  Temperature is 97.9, pulse 48,  respirations 19, blood pressure 120/65.   DIAGNOSTICS:  1. The patient did have an EEG which was normal.  2. The patient also had carotid Dopplers which shows no      hemodynamically significant stenosis, although he did have      increased right ICA systolic velocity of [id number].  The ECA was 184 and      the radiologist thought this was due to increased tortuosity in the      [place].  The ICA and CCA index is 1.7.  The velocity in the left side,      left ICA is 72.   ASSESSMENT/PLAN:  1. Body shaking spell of unclear etiology.  EEG is negative.  2. 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  3. Numbness of the legs, possibly distal neuropathy.   We will have the patient arranged for outpatient open MRI.  No changes  in his medications are recommended at this time.  He probably should be  placed on driving restrictions until he comes back off his seizures and  we have re-evaluated the situation.      Kofi A. Merlene Laughter, M.D.  Electronically Signed     KAD/MEDQ  D:  07/05/2008  T:  07/05/2008  Job:  [id number]

## 2010-11-11 NOTE — Group Therapy Note (Signed)
[name], [name]             ACCOUNT NO.:  [id number]   MEDICAL RECORD NO.:  [id number]          PATIENT TYPE:  INP   LOCATION:  A309                          FACILITY:  [facility]   PHYSICIAN:  Edward L. Luan Pulling, M.D.DATE OF BIRTH:  January 11, 1966   DATE OF PROCEDURE:  DATE OF DISCHARGE:                                 PROGRESS NOTE   [name] says that he had more chest discomfort last night.  He was  similar to what he had several nights ago, it was a pressure-like  sensation somewhat sharp, it was in his midsternal area.  He is being  worked up for all that already.  He has not had any more of the  seizure activity.  His legs are better.  His diabetes has been pretty  well controlled.   PHYSICAL EXAMINATION:  VITAL SIGNS:  This morning shows that his  temperature is 97.3, pulse 53, respirations 18, blood pressure 124/76,  and O2 sats 94% on 2L.  CHEST:  Clear.  HEART:  Regular.  ABDOMEN:  Soft.   ASSESSMENT:  He has had chest discomfort, but thus far, we have no  evidence of any sort of a myocardial infarction.  His workup is  underway.  I have discussed his situation with Cardiology Team today.      Edward L. Luan Pulling, M.D.  Electronically Signed     ELH/MEDQ  D:  07/23/2008  T:  07/23/2008  Job:  [id number]

## 2010-11-11 NOTE — Discharge Summary (Signed)
NAMESHADDAI, PUGLIA             ACCOUNT NO.:  [id number]   MEDICAL RECORD NO.:  [id number]          PATIENT TYPE:  INP   LOCATION:  [id number]                          FACILITY:  [facility]   PHYSICIAN:  Edward L. Luan Pulling, M.D.DATE OF BIRTH:  1966-04-26   DATE OF ADMISSION:  12/26/2008  DATE OF DISCHARGE:  LH                               DISCHARGE SUMMARY   ADDENDUM:  Mr. Dubow has been accepted for a bed at [facility] in the skilled care facility, which I think is the best thing  for him.  He is doing better.  He was able to walk some.  He developed  some more nausea, so I have started him on Reglan 5 mg q.i.d. and that  will be a new medication and that should be added to the list of  medications, which is Reglan 5 mg a.c. and h.s. p.o.  Once he is able to  be improved enough to resume independent living, he may need home health  services, etc.      Jasper Loser. Luan Pulling, M.D.  Electronically Signed     ELH/MEDQ  D:  01/10/2009  T:  01/11/2009  Job:  [id number]

## 2010-11-11 NOTE — Discharge Summary (Signed)
NAMEAUNDRAE, [name]             ACCOUNT NO.:  [id number]   MEDICAL RECORD NO.:  [id number]          PATIENT TYPE:  INP   LOCATION:  A309                          FACILITY:  [facility]   PHYSICIAN:  Edward L. Luan Pulling, M.D.DATE OF BIRTH:  [birth year]   DATE OF ADMISSION:  11/25/2008  DATE OF DISCHARGE:  06/02/2010LH                               DISCHARGE SUMMARY   FINAL DISCHARGE DIAGNOSES:  1. Chest pain, myocardial infarction ruled out.  2. Previous history of cardiac catheterization with normal coronary      arteries.  3. Morbid obesity.  4. Sleep apnea.  5. Anxiety and depression.  6. 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  7. Gastroesophageal reflux disease.  8. Benign prostatic hypertrophy with outflow obstruction.  9. History of gout.  10.Hypertension.  11.Possible seizure disorder.  12.Hypokalemia.  13.Level 3 renal failure.  14.Diabetes.  15.Diabetic neuropathy.   HISTORY:  Mr. Cude is a 45 year old who came to the emergency room  with chest pain.  He actually brought a family member to the emergency  room for a migraine and then developed chest discomfort.  He asked for  treatment, was given nitroglycerin which did not help and eventually was  admitted.  His cardiac markers were negative.  He did have a cardiac  catheterization done in February of this year that did not show any  significant coronary artery occlusive disease.   His potassium is 2.8, creatinine 2.05.   PHYSICAL EXAMINATION:  GENERAL:  A morbidly obese male who is in no  acute distress.  CHEST:  Fairly clear.  HEART:  Regular.  ABDOMEN:  Soft.  EXTREMITIES:  No edema.   HOSPITAL COURSE:  He was admitted, had serial cardiac enzymes and EKGs  and improved.  By the time of discharge, he was better.  He was on 4 L  of O2 and that helped his oxygen level.  He had an x-ray of his foot  which was negative.  He had venous ultrasound which was negative.  He  was much better by the time of discharge to  the point that he was able  to be discharged home.  He is going to continue on his oxygen.  He is  going to continue with his other medications including his insulin,  Klonopin 3 mg at bedtime, propranolol 160 mg at bedtime, Prozac 40  mg daily, Neurontin 300 mg t.i.d., prednisone 10 mg daily, Endocet  10/325 q.4 h. p.r.n., fentanyl patch 75 mcg every 3 days, Nexium 40 mg  daily, Flomax 0.4 daily, colchicine 0.6 b.i.d., Norvasc 5 mg daily,  nitroglycerin 0.4 as needed, Keppra 500 mg daily, Lasix 40 mg daily as  needed for swelling.      Edward L. Luan Pulling, M.D.  Electronically Signed     ELH/MEDQ  D:  11/28/2008  T:  11/29/2008  Job:  [id number]

## 2010-11-11 NOTE — Group Therapy Note (Signed)
NAMEADALI, CALAMIA             ACCOUNT NO.:  [id number]   MEDICAL RECORD NO.:  [id number]          PATIENT TYPE:  OBV   LOCATION:  [id number]                          FACILITY:  [facility]   PHYSICIAN:  Audria Nine, M.D.DATE OF BIRTH:  [birth year]   DATE OF PROCEDURE:  09/12/2007  DATE OF DISCHARGE:                                 PROGRESS NOTE   SUBJECTIVE:  The patient continues to have severe pain in his right  knee.  He describes this as an 8 out of 10.  He is having difficulty  keeping or putting his weight on that knee.  He continues to have  hesitancy and difficulty with urination.  He has been followed by a  urologist who has told him that he has prostate hypertrophy.  The  patient was previously on Flomax but he stopped taking this trying to  see if he would be able to go spontaneously.  Surgery has been put on  hold due to the fact that he is fairly young and there are concerns that  he may have complications from the surgery.  The patient has a history  of gout in the past and feels this was an attack of gout.  It is  consistent with his prior history.   OBJECTIVE:  GENERAL:  Conscious, alert, in pain distress.  VITAL SIGNS: Blood pressure was 133/87 with a pulse of 51, respirations  20, temperature 97.7 degrees Fahrenheit, oxygen saturation is 93% on  room air.  HEENT:  Normocephalic atraumatic.  Oral mucosa was moist.  NECK:  Supple.  No JVD.  No lymphadenopathy.  LUNGS:  Clear clinically with good air entry bilaterally.  HEART:  S1 S2 distant.  No S3, S4, gallops, or rubs.  ABDOMEN:  Obese but soft, not tender.  Bowel sounds positive.  No masses  palpable.  EXTREMITIES:  The patient has swelling in the right knee with no  significant redness, erythema noted.  There was also some swelling in  his left elbow and ankles.  CNS:  Grossly intact with no focal neurological deficits.   LABORATORY/DIAGNOSTIC DATA:  White blood cell count is 12.6, hemoglobin  13.3, hematocrit  39, platelet count 383.  Please note that these labs  were from yesterday.   ASSESSMENT/PLAN:  1. Acute gouty arthritis.  The patient is currently on prednisone      which appears to be improving the inflammation.  We will continue      on Dilaudid as needed.  We are avoiding nonsteroidal      antiinflammatory drugs at this time due to his renal insufficiency.  2. Pain control.  The patient continues to have some pain.  I will      adjust his Dilaudid to 3 mg q.3 h.  3. Benign prostatic hypertrophy.  The patient having significant      symptoms.  His Foley catheter has been removed.  He complains of      hesitancy and poor stream.  Plan at this time is to restart his      Flomax.  4. Chronic renal insufficiency.  The patient is being  followed by      [facility].  No acute issues at this time.  I will be      obtaining a BMP tomorrow.  5. Migraines.  The patient is stable.  The patient is on propranolol.      I did notice that he had some bradycardia, although he is      asymptomatic.  We will continue to follow this very closely.  6. We will initiate DVT prophylaxis with Lovenox at this time.      Audria Nine, M.D.  Electronically Signed     AM/MEDQ  D:  09/12/2007  T:  09/12/2007  Job:  [id number]

## 2010-11-11 NOTE — Consult Note (Signed)
[name], [name]             ACCOUNT NO.:  [id number]   MEDICAL RECORD NO.:  [id number]          PATIENT TYPE:  INP   LOCATION:  A226                          FACILITY:  [facility]   PHYSICIAN:  Kofi A. Merlene Laughter, M.D. DATE OF BIRTH:  January 09, 1966   DATE OF CONSULTATION:  DATE OF DISCHARGE:                                 CONSULTATION   REASON FOR CONSULTATION:  Seizures.   HISTORY OF PRESENT ILLNESS:  This is a 45 year old white male who  apparently has a history of severe seizures, although this has not clear  at this time.  He has not been on seizure medication.  The patient has  difficulty speaking after this seizure and therefore history is  difficult.  The history was obtained through the son.  The patient  apparently had significant shaking, particularly  involving the upper  body.  No stiffening was reported.  No oral trauma.  The patient  endorsed having urinary incontinence with the event.  Again, however,  given the difficulty to interview the patient after with his difficulty  talking this makes the evaluation difficult.  It appears that since he  has had this event he has not been able to talk at all.  He appears to  have good comprehension however.  The patient does not report any  psychosocial stresses.  The son reports that there is really no unusual  stress which the patient also agrees with.  The patient had difficulty  ambulating several months ago and I did see him  in consultation for  this.  The weakness was thought to be a complication of gouty arthritis.   PAST MEDICAL HISTORY:  1. Gout.  2. Gastroesophageal reflux disease.  3. Chronic pain.  4. Migraines.  5. Questionable seizure disorder.  6. Attention deficit disorder.   MEDICATIONS:  1. Celebrex.  2. Clonazepam 3 mg nightly.  3. Propranolol 160 mg nightly.  4. Vitamin D 2000 units t.i.d.  5. Concerta 123XX123 mg daily.  6. Endocet 10/325 p.r.n.  7. Fentanyl transdermal 25 mcg.  8. Flexeril 10 mg  p.r.n.  9. Flomax 0.4 mg daily.  10.Neurontin 300 mg t.i.d.  11.Nexium 40 mg daily.  12.Prednisone 10 mg daily.  13.Prozac 20 mg daily.  14.Oxycodone 15 mg p.r.n.  15.Uloric 40 mg daily.   ALLERGIES:  LATEX, PENICILLIN, SHELLFISH, BEE STING, AND IODINE.   SOCIAL HISTORY:  No alcohol use.  No tobacco use.  No illicit drug use.   FAMILY HISTORY:  Hypertension.   REVIEW OF SYSTEMS:  As stated in the history of the present illness,  otherwise unrevealing.   PHYSICAL EXAMINATION:  Showed a morbidly obese man in no acute distress.  Temperature 98.4, pulses 56, respirations 20, blood pressure 110/55.  HEENT EVALUATION:  Neck is short and stocky.  Large tongue.  No oral  trauma observed.  No tongue biting.  ABDOMEN:  Obese, but soft.  EXTREMITIES:  No significant edema.  MENTATION:  The patient is awake and alert.  He comprehends very well,  but is not talking at all.  The pattern is inconsistent with a true  underlying anatomic pathology.  CRANIAL NERVE EVALUATION:  Pupils are 5 mm.  They are briskly reactive.  Visual fields are intact.  Extraocular movements are full.  Facial  muscle strength symmetric.  Tongue midline.  Uvula midline.  Shoulder  shrug normal.  MOTOR EXAMINATION:  Shows normal tone, bulk and strength.  There was no  pronator drift.  Coordination shows no dysmetria, no past-pointing, no  tremors.  No parkinsonism.  Reflexes are preserved.  Plantar reflexes  both downgoing.  Sensation normal to temperature, light touch.   A CT scan of brain shows nothing acute involving the brain parenchyma.  There is a left maxillary osteotomy from previous scan.  Sodium 138,  potassium 4.2, chloride 106, CO2 of 30, BUN 29, creatinine 1.6, glucose  98.  WBC 8.7, hemoglobin 13, platelet count 209.  LFTs normal, potassium  8.6, albumin 3.   IMPRESSION:  1. A single event that may represent a seizure.  2. Mutism, unclear etiology.  The pattern does not seem to fit any      anatomic  pathology which raises up the question of conversant      disorder, although no identifying stressors are uncovered.   RECOMMENDATIONS:  1. EEG.  2. MRI of the brain.  3. Additional blood testing for B12 level, homocystine level, RPR, sed      rate and repeat neurological evaluation.   Thank for this consultation.      Kofi A. Merlene Laughter, M.D.  Electronically Signed     KAD/MEDQ  D:  07/04/2008  T:  07/04/2008  Job:  [id number]

## 2010-11-11 NOTE — H&P (Signed)
NAMEJAY, RADACH             ACCOUNT NO.:  [id number]   MEDICAL RECORD NO.:  [id number]          PATIENT TYPE:  OBV   LOCATION:  A312                          FACILITY:  [facility]   PHYSICIAN:  Thayer Headings, MD    DATE OF BIRTH:  03/12/1966   DATE OF ADMISSION:  09/10/2007  DATE OF DISCHARGE:  LH                              HISTORY & PHYSICAL   PRIMARY CARE PHYSICIAN:  Tammi Sou, M.D.   CHIEF COMPLAINTS:  Pain in joints.   HISTORY OF PRESENT ILLNESS:  This is a 45 year old male with a long  history of gout diagnosed by paracentesis years ago.  Presented here  with joint swelling and pain consistent with his usual gout attacks,  however, much worse than typical.  The patient reports that he has been  on allopurinol, however,  has been trying to taper down but reports  every time he tapers down he has a flare as he is now.  The patient  denies any other fever or other issues.   PAST MEDICAL HISTORY:  1. Gout.  2. Migraines.  3. Renal insufficiency.   MEDICATIONS:  Celebrex, clonazepam 3 mg q.h.s., Maxalt as needed,  allopurinol, propranolol 160 mg q.h.s.   ALLERGIES:  IODINE, PENICILLIN, LATEX.   SOCIAL HISTORY:  The patient denies any alcohol, tobacco or drug use.   FAMILY HISTORY:  Significant for hypertension in his mother.   REVIEW OF SYSTEMS:  Negative except as per history of present illness.   PHYSICAL EXAMINATION:  VITAL SIGNS:  Temperature 99.6, pulse 101,  respirations 16, blood pressure 141/78.  GENERAL:  The patient is awake, alert and oriented x3, appears in mild  distress.  HEENT:  Anicteric with poor dentition, halitosis.  CARDIOVASCULAR:  Regular rate and rhythm.  No murmurs, rubs or gallops.  LUNGS:  Clear to auscultation bilaterally.  ABDOMEN:  Obese, nontender, nondistended.  Positive bowel sounds.  No  hepatosplenomegaly.  EXTREMITIES:  No cyanosis, clubbing, edema.  MUSCULOSKELETAL:  With left elbow, bilateral ankle swelling and  tenderness to touch.   LABORATORY DATA:  Uric acid 7.8.  BMET significant for a glucose of 146,  WBCs 11.8 with 84% neutrophils, hemoglobin 14 and platelets 359.  UA  significant for proteinuria.   ASSESSMENT/PLAN:  1. Gouty attack.  The patient will be started on prednisone p.o.,      avoiding NSAIDs due to his renal insufficiency, including avoiding      Celebrex for the same.  We will also treat the pain symptomatically      with p.o. and IV medications.  2. Renal insufficiency.  The patient has proteinuria and is followed      by [facility].  3. Migraines.  We will continue with the patient's propranolol.      Thayer Headings, MD  Electronically Signed     RWC/MEDQ  D:  09/10/2007  T:  09/10/2007  Job:  [id number]

## 2010-11-11 NOTE — H&P (Signed)
[name], [name]             ACCOUNT NO.:  [id number]   MEDICAL RECORD NO.:  [id number]          PATIENT TYPE:  INP   LOCATION:  A226                          FACILITY:  [facility]   PHYSICIAN:  Thayer Headings, MD    DATE OF BIRTH:  [birth year]   DATE OF ADMISSION:  07/03/2008  DATE OF DISCHARGE:  LH                              HISTORY & PHYSICAL   PRIMARY CARE PHYSICIAN:  Tammi Sou, MD   CHIEF COMPLAINT:  Seizure.   HISTORY OF PRESENT ILLNESS:  This is a 46 year old male with history of  seizures and migraines presented with a witnessed upper body tonic  clonic activity, witnessed by his son.  This occurred this evening,  lasted about 2 minutes and was associated with an approximately 20  minute postictal period.  The son does report he tried to wake him, but  he was unable due to somnolence, and brought him in for evaluation.  It  was also noted that he had difficulty moving his lower extremities after  the event, and in fact, this persisted during his emergency room  evaluation.  The son, otherwise, reports that this is not typical of his  previous seizures in the past.  The patient did describe the day before  a headache did follow this.  At this time, the patient is nonverbal for  an unknown reason, but does communicate by texting on a cell phone.   PAST MEDICAL HISTORY:  1. GERD.  2. Gout.  3. Chronic pain.  4. Migraines.  5. Seizure disorder.  6. Attention deficit disorder.   MEDICATIONS:  1. Celebrex 200 mg daily.  2. Clonazepam 3 mg q.h.s.  3. Propranolol 160 mg q.h.s.  4. Vitamin D 2000 units t.i.d.  5. Concerta 123XX123 mg daily.  6. Endocet 10/325 mg p.r.n.  7. Fentanyl transdermal 25 mcg per hour daily.  8. Flexeril 10 mg p.r.n.  9. Flomax 0.4 mg daily.  10.Neurontin 300 mg t.i.d.  11.Nexium 40 mg daily.  12.Prednisone 10 mg daily.  13.Prozac 20 mg daily.  14.Uloric 40 mg daily.  15.Oxycodone 15 mg p.r.n.   ALLERGIES:  LATEX, PENICILLIN, IODINE,  SHELLFISH, BEE STINGS.   SOCIAL HISTORY:  No known history of alcohol, tobacco or drugs.   FAMILY HISTORY:  Per the records, hypertension, unobtainable from the  patient.   REVIEW OF SYSTEMS:  Unobtainable from the patient, but son reports as  above as per the history of present illness.   PHYSICAL EXAMINATION:  VITALS:  Temperature is 98.8, pulse of 57,  respirations 20, blood pressure 123/69 and O2 sats 98%.  GENERAL:  The patient is awake and alert, appears in no acute distress  and appropriately responds to commands and questions, however, is  nonverbal.  CARDIOVASCULAR:  Regular rate and rhythm with no murmurs, rubs or  gallops.  LUNGS:  Clear to auscultation bilaterally.  ABDOMEN:  Morbidly obese, nontender, nondistended with positive bowel  sounds and no hepatosplenomegaly.  EXTREMITIES:  No cyanosis, clubbing or edema.   LABORATORY DATA:  Includes a CT of the head with no acute disease.  Chest  x-ray with stable cardiomegaly.   LABORATORY:  Sodium 138, potassium 4.2, chloride 106, bicarb 30, glucose  98, BUN 29, creatinine 1.22, AST 21, ALT 23, WBC 6.9, hemoglobin 12.9,  platelets 198.   ASSESSMENT/PLAN:  1. Seizure.  The tonic-clonic activity and postictal state are      consistent with a seizure.  Will have neurology consult to see the      patient later today for any other input if he needs any medication      adjustments or evaluation.  Also, will treat any seizures with      p.r.n. medications.  Will also have neuro precautions in his bed.  2. Nonverbal state.  Unclear why the patient at this time is not      talking.  This is not something typically associated with a      postictal state and has been several hours since his seizure      activity.  I, therefore, think it is unrelated to his seizures.      This may be related to a conversion disorder.  Will have Behavioral      Health to see the patient.  3. History of renal insufficiency.  It appears to resolved  at this      time.  4. History of gout.  Will continue with his home medications.  5. Chronic pain.  Will hold off on pain medications tonight, except      for the Fentanyl patch, and those can be potentially restarted      tomorrow.  6. Migraines.  The patient is on propranolol daily.  Will continue      with that.  7. Depression.  I do not see a diagnosis of depression in his history.      However, he is on Prozac.  I will defer any management of this to      [facility].   DISPOSITION:  Will have him on DVT prophylaxis.      Thayer Headings, MD  Electronically Signed     RWC/MEDQ  D:  07/04/2008  T:  07/04/2008  Job:  [id number]

## 2010-11-11 NOTE — Group Therapy Note (Signed)
NAMEDONOVAN, LOGEMAN             ACCOUNT NO.:  [id number]   MEDICAL RECORD NO.:  [id number]          PATIENT TYPE:  INP   LOCATION:  A309                          FACILITY:  [facility]   PHYSICIAN:  Edward L. Luan Pulling, M.D.DATE OF BIRTH:  [birth year]   DATE OF PROCEDURE:  DATE OF DISCHARGE:                                 PROGRESS NOTE   [name] has had more problems.  He had some sort of a atypical  seizure.  These are the problems that he had on his last admission, and  Dr. Merlene Laughter has seen him.  He says he thinks that these are probably  some sort of myoclonic jerks.  Everything else is about the same.  He  also had some chest pain last night, but his EKG looked okay.  The chest  pain, however, was in the substernal region and moved in to his left  shoulder he said.  So, he is at least somewhat typical, so I am going to  go ahead and get a Cardiology consultation as well.   His physical examination today shows that his temperature is 98.5, his  pulse is 64, respirations are 16, blood pressure 145/72, and O2 sats 98%  on 2 L.  His chest is actually clear.  His heart is regular.  He still  has some edema of his legs and he still has some what appears to be some  cellulitis changes in his legs.   ASSESSMENT:  My assessment then is that he has morbid obesity, which is  unchanged.  He has had some chest tightness, need to be reevaluated.  He  has had some sort of a seizure.  He has chronic pain from multiple  injuries and at this point after the Cardiology consultation, we are  still hoping to get him home on intravenous antibiotics.  I am going to  get an echocardiogram as well.  Since he has edema, now he has had some  chest discomfort.  Make sure that we are not missing something cardiac.      Edward L. Luan Pulling, M.D.  Electronically Signed     ELH/MEDQ  D:  07/20/2008  T:  07/20/2008  Job:  [id number]

## 2010-11-11 NOTE — Discharge Summary (Signed)
[name], [name]             ACCOUNT NO.:  [id number]   MEDICAL RECORD NO.:  [id number]          PATIENT TYPE:  INP   LOCATION:  A309                          FACILITY:  [facility]   PHYSICIAN:  Edward L. Luan Pulling, M.D.DATE OF BIRTH:  31-Jan-1966   DATE OF ADMISSION:  07/17/2008  DATE OF DISCHARGE:  LH                               DISCHARGE SUMMARY   FINAL DISCHARGE DIAGNOSES:  1. Cellulitis of both legs.  2. Migraine headaches.  3. Renal insufficiency.  4. Myoclonic jerks, possible seizure disorder.  5. Chronic pain.  6. Depression.  7. Morbid obesity.  8. Chest discomfort with negative stress echocardiogram.  9. Sleep apnea.  10.Hypoxia.  11.Hypertension.  12.Anxiety.   HISTORY:  [name] is a 45 year old who was admitted with what  appeared to be a seizure versus a pseudoseizure.  He came back to the  hospital because he had increasing swelling of his legs.   PHYSICAL EXAMINATION:  GENERAL:  His physical examination on admission  showed he was awake and alert.  He was morbidly obese.  VITAL SIGNS:  Temperature 97.7, pulse 59, respirations 18.  Blood  pressure was 140/77.  Oxygen saturation was 95% on 2L.  Height 75  inches.  Weight 180.5 kg.  CHEST:  Was relatively clear.  HEART:  Regular.  EXTREMITIES:  On my initial history and physical, I said it showed no  edema, however, it does show erythema and inflammation.   HOSPITAL COURSE:  My assessment at the time of admission was that he  probably cellulitis of the legs, we ruled out pulmonary embolus and deep  venous thrombosis.   The patient's Fentanyl patch was increased while he was in the hospital  to 75 mg.  He is requesting something for sleep but I am concerned that  he is already taking 3 mg of Klonopin at bedtime.  During his  hospitalization he had some chest discomfort that was worked up and he  ruled out for significant ischemia and is discharged home.   DISCHARGE MEDICATIONS:  1. Klonopin 3 mg at  bedtime.  2. Propranolol 160 mg extended release at bedtime.  3. Prozac 40 mg at bedtime.  4. Neurontin 300 mg 3 times a day.  5. Prednisone 10 mg daily.  6. Endocet 10/325 q.4-6h. p.r.n. pain.  7. Fentanyl patch 75 mcg every 3 days.  8. Flomax 0.4 mg daily.  9. Omeprazole 40 mg daily.  10.Uloric 40 mg daily.  11.Colchicine 0.6 daily.  12.A new medication, Norvasc 5 mg daily.   He is going to have home health services and receive vancomycin  intravenously for 7 more days at home.  He also had multiple episodes of  his oxygen saturation dipping into the 80s and I have discharged him on  oxygen.  He is going to continue his CPAP and he is going to obtain a  new mask.      Edward L. Luan Pulling, M.D.  Electronically Signed     ELH/MEDQ  D:  07/24/2008  T:  07/24/2008  Job:  [id number]

## 2010-11-11 NOTE — H&P (Signed)
NAMELUIAN, NERISON             ACCOUNT NO.:  [id number]   MEDICAL RECORD NO.:  [id number]         PATIENT TYPE:  PINP   LOCATION:  IC01                          FACILITY:  [facility]   PHYSICIAN:  Edward L. Luan Pulling, M.D.DATE OF BIRTH:  Jun 27, 1966   DATE OF ADMISSION:  DATE OF DISCHARGE:  LH                              HISTORY & PHYSICAL   [name] was brought in for observation after having come to the  emergency room yesterday morning with chest discomfort and hypokalemia.  He was hydrated, given potassium replacement, but he continued to have  chest pain.  He came to the emergency room because of chest discomfort.  His chest discomfort was in the substernal region.  He said initially it  felt like he had an elephant on his chest and then it became less  severe.  He took nitroglycerin, which did not work, but he does not have  coronary artery disease by cardiac cath.  He had been in and out of the  hospital with his chest pain on multiple occasions, and I am hopeful  though we can come up with a better strategy to treat his chest pain at  home so that he does not have to come to the emergency room for  treatment.  When he came in, he was found to be hypokalemic and he is on  large doses of diuretics because he has peripheral edema, which I think  is related to his morbid obesity and venous compression.   PAST MEDICAL HISTORY:  Positive for chest pain on multiple occasions for  which he has had cardiac catheterization that was normal.  He has a past  medical history of insulin-dependent diabetes, osteoarthritis,  hypouricemia, gout, morbid obesity, peripheral neuropathy, BPH, renal  insufficiency, and a seizure disorder.   PAST SURGICAL HISTORY:  He has had a cholecystectomy, lens implantation,  and cardiac catheterization.   SOCIAL HISTORY:  He does not use any alcohol.  He has about a 20-pack-  year smoking history, but none recently.   MEDICATIONS AT HOME:  1. Klonopin 3 mg  at bedtime.  2. Propranolol 160 mg at bedtime.  3. Prozac 40 mg at bedtime.  4. Neurontin 300 mg t.i.d.  5. Prednisone 10 mg daily.  6. Endocet 10/325 q.4 h. p.r.n.  7. Fentanyl patch 75 mcg every 3 days.  8. Nexium 40 mg daily.  9. Flomax 0.4 daily.  10.Colchicine 0.2 daily.  11.Norvasc 5 mg daily.  12.Nitroglycerin as needed.  13.He is on insulin for his diabetes.   REVIEW OF SYSTEMS:  Except as mentioned is essentially negative.   PHYSICAL EXAMINATION:  GENERAL:  A morbidly obese male who is in no  acute distress.  VITAL SIGNS:  His temperature is 97.3, pulse is 49, his respirations are  20, blood pressure 153/77, O2 sat is 99% on 4 L, his height is 75  inches, and weight 181.3 kilos.  EYES:  Pupils are reactive.  NOSE AND THROAT:  Clear.  NECK:  Supple without masses.  CHEST:  Clear without wheezes, rales, or rhonchi.  HEART:  Regular without murmur, gallop,  or rub.  EXTREMITIES:  He has edema on both legs and may be some mild erythema.   ASSESSMENT:  He has morbid obesity.  He has edema from that.  He has  chest discomfort.  He is ruled out for myocardial infarction and he has  normal coronary arteries by cardiac catheterization.   My plan then is to try to replete his potassium.  He is on Demadex and  takes metolazone, so I am going to leave him on the [facility] and switch  him to spironolactone, which help to raise his potassium.  His chest  pain does seem to some extend to be related to hypokalemia, but I am  going to ask for cardiology consultation and see if they have from their  experience with similar patients a plan for how to treat this chest pain  without the patient necessarily having to come to the hospital.  We will  continue with all the other meds and follow.      Edward L. Luan Pulling, M.D.  Electronically Signed     ELH/MEDQ  D:  12/05/2008  T:  12/06/2008  Job:  [id number]

## 2010-11-11 NOTE — Group Therapy Note (Signed)
[name], [name]             ACCOUNT NO.:  [id number]   MEDICAL RECORD NO.:  [id number]          PATIENT TYPE:  INP   LOCATION:  A309                          FACILITY:  [facility]   PHYSICIAN:  Kofi A. Merlene Laughter, M.D. DATE OF BIRTH:  1966/01/22   DATE OF PROCEDURE:  DATE OF DISCHARGE:  07/24/2008                                 PROGRESS NOTE   He has now new complaints today.  So far, he has not been seen by  ophthalmologist.  Temperature 97.4, pulse 84, blood pressure 108/66.  The patient has actually been discharged today.  He is to follow up with  his ophthalmologist.  His MRI pending tomorrow.  We will have him do  this and to return to the office for further followup.      Kofi A. Merlene Laughter, M.D.  Electronically Signed     KAD/MEDQ  D:  07/24/2008  T:  07/24/2008  Job:  [id number]

## 2010-11-11 NOTE — H&P (Signed)
NAMEENIOLA, JOSWICK             ACCOUNT NO.:  [id number]   MEDICAL RECORD NO.:  [id number]          PATIENT TYPE:  INP   LOCATION:  A309                          FACILITY:  [facility]   PHYSICIAN:  Angus G. Everette Rank, MD   DATE OF BIRTH:  1965-11-10   DATE OF ADMISSION:  11/25/2008  DATE OF DISCHARGE:  LH                              HISTORY & PHYSICAL   ADDENDUM   The patient's lab study shows a potassium of 2.8, creatinine 2.05, and  GFR 36.  Plan to replete his potassium tonight and repeat studies in  a.m.      Angus G. Everette Rank, MD  Electronically Signed     AGM/MEDQ  D:  11/25/2008  T:  11/26/2008  Job:  [id number]

## 2010-11-11 NOTE — Group Therapy Note (Signed)
NAMEMANU, LANDAVAZO             ACCOUNT NO.:  [id number]   MEDICAL RECORD NO.:  [id number]         PATIENT TYPE:  PINP   LOCATION:  A306                          FACILITY:  [facility]   PHYSICIAN:  Edward L. Luan Pulling, M.D.DATE OF BIRTH:  [birth year]   DATE OF PROCEDURE:  DATE OF DISCHARGE:                                 PROGRESS NOTE   Mr. Model had his EGD today and did not have any significant ulcers,  but did have what might be some pancreatic rest tissue.  Dr. Stann Mainland has  suggested that we switch him from his current PPI, which is Protonix to  Zegerid and has ordered that.  He states he is a little groggy this  morning, but otherwise feeling fairly well.  He has no other new  complaints.  He is not having any chest pain now.   PHYSICAL EXAMINATION:  VITAL SIGNS:  Temperature is 98.5, pulse 53,  respirations 20, blood pressure 105/54, and O2 sat is 92% on room air.  CHEST:  Clear.  HEART:  Regular.  ABDOMEN:  Soft.   LABORATORY DATA:  His BUN and creatinine have got progressively worse  and now his BUN is 69 and creatinine 3.56.  Some of this I suspect from  being n.p.o.'s and some of it is of course from his diuresis.  So, I am  going to stop his diuretics and stop his potassium.  Continue with his  IV fluids and repeat his BMET in the morning.      Edward L. Luan Pulling, M.D.  Electronically Signed     ELH/MEDQ  D:  12/07/2008  T:  12/07/2008  Job:  [id number]

## 2010-11-11 NOTE — Consult Note (Signed)
[name], [name]             ACCOUNT NO.:  [id number]   MEDICAL RECORD NO.:  [id number]          PATIENT TYPE:  INP   LOCATION:  IC01                          FACILITY:  [facility]   PHYSICIAN:  Cristopher Estimable. Lattie Haw, MD, FACCDATE OF BIRTH:  05/15/1966   DATE OF CONSULTATION:  12/05/2008  DATE OF DISCHARGE:                                 CONSULTATION   REFERRING PHYSICIAN:  Edward L. Luan Pulling, MD   PRIMARY CARDIOLOGIST:  Cristopher Estimable. Lattie Haw, MD, [facility]   HISTORY OF PRESENT ILLNESS:  A 45 year old gentleman with chronic chest  pain syndrome and recent negative cardiac catheterization, readmitted  for recurrent chest discomfort.  [name] has been seen on and off  for the past few months with frequent episodes of chest discomfort.  He  described sharp and moderately severe anterior chest pain radiating to  the upper back.  There is associated dyspnea and nausea.  The episodes  had prompted him to summon EMS, was more prolonged, more severe and  associated with more prominent dyspnea and nausea.  There was no relief  with multiple sublingual nitroglycerin.  He has had frequent episodes at  home since I last saw him, some of which are substantially improved with  nitroglycerin.   [name] has had problems with chronic edema, not thought to  represent congestive heart failure, and hypokalemia related to his  diuretics.  He has renal insufficiency, also presumed related to his  diuretics.  He has not had a clear beneficial response to antianginal  drugs including amlodipine and beta-blocker.   He reports allergies to penicillin and intravenous contrast.   CARDIAC-RELATED MEDICATIONS:  On admission included;  1. Metolazone 5 mg daily.  2. Demadex 100 mg daily.  3. Flomax 0.4 mg daily.  4. Enalapril 5 mg daily.  5. KCl 20 mEq b.i.d.  6. Amlodipine 5 mg daily.  7. Propranolol 80 mg q.p.m.   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   FAMILY HISTORY, SOCIAL HISTORY, AND REVIEW OF SYSTEMS:  Updated.  There  had been no notable changes.   PHYSICAL EXAMINATION:  GENERAL:  A pleasant gentleman in no acute  distress.  VITAL SIGNS:  The temperature is 97.9, heart rate 50 and regular,  respirations 16, blood pressure 100/50, O2 saturation 100% on room air.  Weight is 181, stable.  HEENT:  EOMs full; normal oral mucosa.  NECK:  No jugular venous distention; no carotid bruits.  LUNGS:  Clear.  SKIN:  No lesions.  ABDOMEN:  Soft and nontender; no organomegaly.  EXTREMITIES:  Ankle edema 1/2+ and pretibial edema.  NEUROLOGIC:  Symmetric strength and tone; normal cranial nerves.   Chest x-ray:  Low lung volumes and basilar atelectasis.   EKG:  Sinus bradycardia; minimal inferior Q-waves; otherwise  unremarkable.   Laboratory notable for normal cardiac markers, a potassium of 3.3, a  creatinine  of 2.14 with a BUN of 50, a normal CBC, a normal D-dimer and  normal cardiac markers.   IMPRESSION:  Ms. [name] returns with recurrent chest discomfort.  He  has chronic chest pain syndrome without clear etiology.  He drives  secondary gain from hospital admission, which should be discouraged to  the extent possible.  This will likely be a chronic problem and one that  is not easily solved.  Assistance might be available from a pain  specialist or a psychologist or psychiatrist.  [name] understands  that he may not have a significant pathologic explanation for his  symptoms.  His goal is to feel better and avoid hospital admissions; I  share those aspirations.  I will be happy to help him in any way that I  can, but doubt that additional medical therapy aimed at cardiology   issues will be beneficial.      Cristopher Estimable. Lattie Haw, MD, [facility]  Electronically Signed     RMR/MEDQ  D:  12/05/2008  T:  12/06/2008  Job:  [id number]

## 2010-11-11 NOTE — Discharge Summary (Signed)
[name], [name]             ACCOUNT NO.:  [id number]   MEDICAL RECORD NO.:  [id number]          PATIENT TYPE:  INP   LOCATION:  [id number]                         FACILITY:  [facility]   PHYSICIAN:  Carlena Bjornstad, MD, FACCDATE OF BIRTH:  1965/12/13   DATE OF ADMISSION:  08/02/2008  DATE OF DISCHARGE:  08/03/2008                               DISCHARGE SUMMARY   PRIMARY CARDIOLOGIST:  Cristopher Estimable. Lattie Haw, MD, [facility]   PRIMARY CARE PHYSICIAN:  [place] Luan Pulling, MD   DISCHARGE DIAGNOSIS:  Noncardiac chest pain.   SECONDARY DIAGNOSES:  1. Diabetes mellitus.  2. Hypertension.  3. Renal insufficiency.  4. Arthritis.  5. Gout.  6. Morbid obesity.  7. ? Seizure disorder.  8. Cellulitis of both legs (recently resolved).  9. Anxiety.  10.Diabetic neuropathy.  11.Chronic pain.  12.Obstructive sleep apnea.  13.Migraine headaches.   PAST SURGICAL WITH HISTORY:  1. Cholecystectomy.  2. Tonsillectomy.  3. Status post injury to back and leg (remote, the patient is      disabled).   ALLERGIES:  The patient is allergic to  1. PENICILLIN.  2. IVP CONTRAST DYE.  3. SHELLFISH.  4. LATEX.  5. BEE STINGS.   PROCEDURES PERFORMED DURING THIS HOSPITALIZATION:  1. Chest x-ray performed July 30, 2008.  This showed low volume      exam with atelectasis.  Cardiomegaly without edema.  2. On July 31, 2008 the patient had an EKG.  This showed rhythm of      sinus bradycardia with a rate of 52, no acute ST - T-wave changes      and a normal axis.  3. On August 01, 2008 the patient had a CT angiography of the chest      that showed  4. No embolus identified.  5. Hepatic steatosis.  6. Faint ground-glass opacities in the left lower lobe may be the      results of air trapping or minimal alveolitis.  7. On August 02, 2008 the patient had selective coronary angiography      that showed no significant coronary artery disease.   HISTORY OF PRESENT ILLNESS:  [name] was evaluated in the  emergency  department at [facility] with chest discomfort.  He reports  chest pain starting around 7:30 in the evening yesterday.  He was lying  down, and it was very similar to what he had had in the past.  At his  last admission, he had similar symptoms.  His evaluation included a  stress echocardiogram that did not show any definitive changes  suggesting a cardiac source of his pain.  However, he developed  shortness of breath, and radiation into his left shoulder.  EMS was  called today and he was given nitroglycerin which alleviated his pain  somewhat.  He was given another dose of nitroglycerin in the emergency  department which also alleviated his pain however it did not resolve.   HOSPITAL COURSE:  The patient was admitted to [facility] and  had his cardiac enzymes cycled.  He had four negative sets including one  set of point-of-care  markers.  The patient's lipase was checked and was  19 which is within normal limits.  The patient had an elevated D-dimer  of 0.61, however, was found on CT angio to be negative for pulmonary  embolus.  Due to the compelling nature of the patient's symptoms as well  as their recurrent nature, the patient was prepped for selective  coronary angiography at [facility], transferred to [facility]  and underwent the procedure on August 02, 2008.  The patient was found  to have no significant coronary artery disease and tolerated the  procedure well without any significant complications.  The patient was  deemed in stable condition and able to be discharged from [facility] on August 03, 2008.  Please note that [name] came in with  a PICC line in order to receive IV antibiotics.  Since the patient has  completed his antibiotic course, Dr. Ron Parker spoke with the patient's  primary care physician, Dr. Luan Pulling and they both agreed that the PICC  line could be discontinued.  The patient will have his PICC line removed   and if weather permits, the patient will be discharged from the  hospital.  The patient's vital signs had remained stable during his  hospital course and the patient continued to have asymptomatic sinus  bradycardia.  Most recent vital signs on the day of discharge,  temperature 98.3 degrees Fahrenheit, BP 118/64, pulse 46, respirations  19, O2 saturation 98% on 4 L by nasal cannula.  At that time, I had lost  contact with [facility] Cardiology Team.  The patient had no questions or  concerns that has not been addressed.   DISCHARGE LABORATORY DATA:  Sodium 137, potassium 4.5, chloride 105, CO2  24, BUN 22, creatinine 1.32, glucose 170, calcium 8.4.  Note creatinine  stable over hospital course ranging from 1.27 to 1.35.   FOLLOWUP PLANS AND APPOINTMENTS:  The patient has been instructed to  followup with his primary care physician within 2 weeks.  The patient  will follow up with Dr. Lattie Haw in approximately 1-3 weeks depending on  the schedule.  Due to the weather conditions, the office is closed  today, however message was left and all the relevant followup  information was left with [name] in both oral and written form.   DISCHARGE MEDICATIONS:  1. Clonazepam 3 mg p.o. at bedtime.  2. Propranolol 60 mg p.o. at bedtime.  3. Prozac 20 mg p.o. at bedtime.  4. Neurontin 300 mg p.o. t.i.d.  5. Prednisone 10 mg p.o. daily.  6. Celebrex 200 mg p.o. daily.  7. Endocet 10/325 p.o. q.6 h. p.r.n. for pain.  8. Fentanyl 75 mcg applied patch q.72 hours.  9. Flomax 0.4 mg p.o. daily.  10.Nexium 40 mg p.o. daily.  11.Ulonc 40 mg p.o. daily.  12.Norvasc 5 mg p.o. daily.  13.Colchicine 0.6 mg p.o. daily.   DURATION OF DISCHARGE ENCOUNTER INCLUDING PHYSICIAN TIME:  45 minutes.      Guss Bunde, [facility]      Carlena Bjornstad, MD, [facility]  Electronically Signed    MS/MEDQ  D:  08/03/2008  T:  08/03/2008  Job:  [id number]   cc:   Cristopher Estimable. Lattie Haw, MD, [place] Luan Pulling, M.D.

## 2010-11-11 NOTE — Consult Note (Signed)
[name], [name]             ACCOUNT NO.:  [id number]   MEDICAL RECORD NO.:  [id number]          PATIENT TYPE:  INP   LOCATION:  A318                          FACILITY:  [facility]   PHYSICIAN:  Kofi A. Merlene Laughter, M.D. DATE OF BIRTH:  07-Sep-1965   DATE OF CONSULTATION:  12/27/2008  DATE OF DISCHARGE:                                 CONSULTATION   The patient reports no new complaints.  He reports left-sided weakness.  He is afebrile. His vital signs are stable.  He is awake and alert.  He  continues to have stuttering speech.  His bilateral lower extremities  are still 3/5 and he does move the right side well.  An MRI cannot be  done in-house because of weight issues. This is being setup at [facility].  Will continue with the current care.      Kofi A. Merlene Laughter, M.D.  Electronically Signed     KAD/MEDQ  D:  12/27/2008  T:  12/27/2008  Job:  [id number]

## 2010-11-11 NOTE — Procedures (Signed)
[name], Patrick             ACCOUNT NO.:  [id number]   MEDICAL RECORD NO.:  [id number]          PATIENT TYPE:  INP   LOCATION:  A226                          FACILITY:  [facility]   PHYSICIAN:  Kofi A. Merlene Laughter, M.D. DATE OF BIRTH:  1965/11/09   DATE OF PROCEDURE:  DATE OF DISCHARGE:  07/05/2008                              EEG INTERPRETATION   HISTORY:  A 45 year old man who presents with seizure-like activity.  This study has been evaluated for possible seizures.   MEDICATIONS:  Celebrex, clonazepam, Inderal, vitamin D, Concerta,  Endocet, fentanyl patch, Flomax, Neurontin, Nexium, prednisone, Prozac,  and oxycodone.   ANALYSIS:  A 16-channel recording is conducted for approximately 20  minutes.  The patient is noted to be quite drowsy during the recording.  He did have a background activity when awakened at 12 Hz.  However,  stage II sleep with K-complexes and sleep spindles were observed.  There  is no focal or lateralized slowing.  There is no epileptiform activity  observed.   IMPRESSION:  Normal recording of awake and sleep states.      Kofi A. Merlene Laughter, M.D.  Electronically Signed     KAD/MEDQ  D:  07/05/2008  T:  07/05/2008  Job:  [id number]

## 2010-11-11 NOTE — Discharge Summary (Signed)
NAMENYREE, SMUCK             ACCOUNT NO.:  [id number]   MEDICAL RECORD NO.:  [id number]          PATIENT TYPE:  OBV   LOCATION:  A306                          FACILITY:  [facility]   PHYSICIAN:  Edward L. Luan Pulling, M.D.DATE OF BIRTH:  1965/09/13   DATE OF ADMISSION:  12/04/2008  DATE OF DISCHARGE:  06/14/2010LH                               DISCHARGE SUMMARY   DISCHARGE DIAGNOSES:  1. Chest pain, myocardial infarction ruled out and with no evidence      cardiac catheterizations of coronary artery occlusive disease.  2. Insulin-dependent diabetes mellitus.  3. Osteoarthritis.  4. Gout.  5. Morbid obesity.  6. Peripheral neuropathy.  7. Benign prostatic hypertrophy.  8. Acute-on-chronic renal insufficiency.  9. Seizure disorder.  10.Chronic leg edema.  11.Anxiety and depression.   HISTORY:  Mr. Brau is a 45 year old who came to the emergency room  with chest discomfort.  He has had multiple hospitalizations for chest  discomfort and he was actually had cardiac catheterization about 2  months ago that did not show any significant coronary artery occlusive  disease.  He was also found to be hypokalemic and because of difficulty  in getting, his potassium was replaced in the emergency room.  He was  brought in for observation.  He continued to have chest pain, and  required intravenous medications.  His potassium was still difficult to  manage.  He was noted to have edema as well.   His physical exam on admission showed a well-developed, obese male who  is in no acute distress.  He was complaining of chest discomfort.  His  chest was clear.  His heart was regular.  His abdomen was soft.  Extremities showed 1+ edema bilaterally.  He was morbidly obese.   HOSPITAL COURSE:  He ruled out for myocardial infarction.  His potassium  was replaced.  He was continued on diuresis and because he was did not  appear to have any heart disease as a cause for his chest discomfort, he  had  consultation with the GI team.  He underwent EGD, which did not show  any specific cause for his chest discomfort either.  He was placed on  Zegerid while he was in the hospital and it is not totally clear if  estimated difference or not.  His renal function worsened and his  creatinine went as high as approximately 3.5, his baseline of about 1.7,  and at that point, his diuresis was stopped and his potassium was  stopped.  He was suggested that he have a barium-filled esophagogram  which he did have and this did not show any significant esophageal  spasm.  By time of discharge, he was improved and he was discharged  home, off Demadex, off metolazone, off potassium, and I told him that he  was simply going to have to bear with a certain amount of leg swelling  because we were not going to be able to treat him with intensive  diuresis.   He is going to continue:  1. Flomax 0.4 mg daily.  2. Enalapril 5 mg daily.  3. Keppra 500 mg  twice a day.  4. Colchicine 0.6 mg twice a day.  5. Gabapentin 300 mg 3 times a day.  6. Prozac 40 mg daily.  7. Meloxicam 7.5 mg daily that will be reevaluated.  After he is at      home, he may need to come off of that because it seems to cause      some problems with renal function as well.  8. Omeprazole 20 mg b.i.d.  9. Hydrocodone 10/325 as needed for pain.  10.He has a Duragesic patch 75 mcg, he will remain on every 3 days.  11.Klonopin 3 mg at bedtime.  12.Propranolol 80 mg at bedtime.   He will have home health services and follow up in my office in about 2  weeks.      Edward L. Luan Pulling, M.D.  Electronically Signed     ELH/MEDQ  D:  12/10/2008  T:  12/11/2008  Job:  [id number]

## 2010-11-11 NOTE — Procedures (Signed)
NAMEBRITTIN, CEASE             ACCOUNT NO.:  [id number]   MEDICAL RECORD NO.:  [id number]          PATIENT TYPE:  INP   LOCATION:  A318                          FACILITY:  [facility]   PHYSICIAN:  Kofi A. Merlene Laughter, M.D. DATE OF BIRTH:  1966-02-02   DATE OF PROCEDURE:  DATE OF DISCHARGE:                              EEG INTERPRETATION   REASON FOR THE REPORT:  Seizures in a 44 year old.   MEDICATIONS:  Prednisone, Phenergan, hydrocodone, Keppra, and oxygen.   ANALYSIS:  A 16-channel recording is conducted for approximately 20  minutes.  There is a posterior rhythm of 8.5 Hz, which attenuates with  eye opening.  There was beta activity observed in the frontal areas.  Awake activities are recorded.  There are also some drowsy activities.  Photic stimulation was carried out without significant changes in the  background activity.  There was no focal or lateralized slowing.  There  was no epileptiform activity.   IMPRESSION:  Normal recording of awake and drowsy states.      Kofi A. Merlene Laughter, M.D.  Electronically Signed     KAD/MEDQ  D:  12/27/2008  T:  12/28/2008  Job:  [id number]

## 2010-11-11 NOTE — Consult Note (Signed)
[name], [name]             ACCOUNT NO.:  [id number]   MEDICAL RECORD NO.:  [id number]          PATIENT TYPE:  INP   LOCATION:  A318                          FACILITY:  [facility]   PHYSICIAN:  Kofi A. Merlene Laughter, M.D. DATE OF BIRTH:  Dec 23, 1965   DATE OF CONSULTATION:  12/26/2008  DATE OF DISCHARGE:                                 CONSULTATION   REASON FOR CONSULTATION:  Seizures.   The patient is a 45 year old man who has presented to this hospital with  a spell of seizure previously.  The presentation is similar to a  previous event.  The patient presents here but now has slurred speech  and focal weakness.  The presentation is similar to a previous  hospitalization.  The patient cannot give a good history as to the  seizure but reports losing consciousness and really cannot give an  adequate history.  The information from the emergency room, however,  indicates that he may have had a seizure per the family reports.  He  indicates taking Keppra.  It is unclear why the patient is on Keppra as  he only had one event a few months ago and an EEG was normal at that  time.  This was in January of 2010.  The patient did have imaging a few  months ago of the brain with CT scan and this was normal.  MRI scan was  attempted but he was over the weight limit.  Repeat scan today is  unremarkable.  The patient's previous event improved after several days.   PAST MEDICAL HISTORY:  Morbid obesity, gout, osteoarthritis, anxiety  disorder, diabetes, congestive heart failure, neuropathy, benign  prostatic hypertrophy, renal insufficiency, seizure disorder.   PAST SURGICAL HISTORY:  Cholecystectomy, lens implantation,  tonsillectomy.   SOCIAL HISTORY:  Apparently stays at home and a son stays with him.  No  alcohol or illicit drug use.  He is a former smoker.   ADMISSION MEDICATIONS:  Unknown.  List is not available but he reports  taking Keppra twice a day presumably 500 mg.  Also he is on home  oxygen.   ALLERGIES:  LASIX, PENICILLIN, IV DYE, SHELLFISH, IODINE.   REVIEW OF SYSTEMS:  As in history of presents illness, otherwise  unrevealing.   PHYSICAL EXAMINATION:  GENERAL:  Shows a morbidly obese man in no acute  distress.  VITAL SIGNS:  Temperature 98.1, pulse 58, respirations 18, blood  pressure 149/75.  HEENT:  Shows a large neck.  Significant carotid in the posterior air  space.  ABDOMEN:  Obese but soft.  EXTREMITIES:  Mild edema of the ankles.  MENTATION:  He is awake and alert.  He has somewhat of a stuttering  psychogenic speech.  The patient follows commands well.  CRANIAL NERVES:  Pupils are equal, round and reactive to light and  accommodation.  Extraocular movements are full.  Facial strength is  symmetric.  Tongue midline.  Uvula midline.  Shoulder shrug is normal.  Motor examination shows 3/5 weakness involving the left side.  Bulk and  tone is normal.  Right side shows normal tone, bulk and  strength.  Reflexes are preserved.  Plantar reflexes are equivocal.  Sensation  diminished on the left side.   ACCESSORY DATA:  WBC 7, hemoglobin 13.9, platelet count 212.  Sodium  139, potassium 4.1, chloride 108, CO2 27, glucose 108, BUN 11,  creatinine 1.1, calcium 8.7.  Head CT scan unremarkable.   IMPRESSION:  The patient presented with altered mentation after a spell.  He seems to have residual stuttering, dysarthria and left-sided  weakness.  The patient may have epileptic seizure although this remains  to be sorted out.  The stuttering speech seems to be most consistent  with a psychogenic etiology.  The left-sided weakness also needs to be  evaluated further.   RECOMMENDATIONS:  Increase Keppra to 500 mg in the morning time and 1000  mg at bed time.  EEG.  Dr. Luan Pulling has ordered an MRI which seems  appropriate.   Thank you for this consultation.      Kofi A. Merlene Laughter, M.D.  Electronically Signed     KAD/MEDQ  D:  12/27/2008  T:  12/27/2008  Job:   [id number]

## 2010-11-11 NOTE — Group Therapy Note (Signed)
[name], [name]             ACCOUNT NO.:  [id number]   MEDICAL RECORD NO.:  [id number]          PATIENT TYPE:  INP   LOCATION:  [id number]                          FACILITY:  [facility]   PHYSICIAN:  Edward L. Luan Pulling, M.D.DATE OF BIRTH:  Jan 22, 1966   DATE OF PROCEDURE:  01/10/2009  DATE OF DISCHARGE:                                 PROGRESS NOTE   Mr. Tardo is overall better today.  He is much less nauseated.  He has  no new complaints.   His physical examination today shows that he is awake and alert looks  comfortable.  Temperature is 97.6, pulse 61, respirations 20, blood  pressure 120/67, O2 sats 99% on room air.  His blood sugar 148 at 11  last night and 98 this morning, so his sugar seems to be pretty well-  controlled.  He still complains of weakness in his arm and has some  subjective weakness.   My assessment then is that he has probable seizure.  He has perhaps  conversion reaction.  He has chronic nausea that seems to be a little  better.  He has diabetes, well-controlled.  He has morbid obesity  unchanged and he has chronic pain unchanged.   My plan then would be to continue with his medications and treatments  and have him reevaluated.  We are trying to get him into an assisted-  living facility and still awaiting a bed offer.  He does not have  anywhere else to go at this point.      Edward L. Luan Pulling, M.D.  Electronically Signed     ELH/MEDQ  D:  01/10/2009  T:  01/10/2009  Job:  [id number]

## 2010-11-11 NOTE — Group Therapy Note (Signed)
[name], [name]             ACCOUNT NO.:  [id number]   MEDICAL RECORD NO.:  [id number]          PATIENT TYPE:  INP   LOCATION:  [id number]                          FACILITY:  [facility]   PHYSICIAN:  Edward L. Luan Pulling, M.D.DATE OF BIRTH:  [birth year]   DATE OF PROCEDURE:  01/06/2009  DATE OF DISCHARGE:                                 PROGRESS NOTE   Mr. Holeman is overall about the same.  He has no new complaints.  He  still states that he is weak on the left side.  He still states that he  has some nausea.   His exam this morning shows he is morbidly obese.  Temperature is 96.2,  pulse 50, respirations 20, blood pressure 108/62, and O2 sat is 96% on 2  L.  His chest is clear.  His heart is regular.  It is the finger, which  had been such a problem, is less swollen now.  Neurologically he is  better.   My assessment then is that he has improved.   Plan is to continue with his medications and treatments.  He now says  that he would like to go to an assisted living facility.  He had bed  refusals on skilled care facilities.  Plan then is to try to get him  into an assisted living facility hopefully as early as the 12th.      Edward L. Luan Pulling, M.D.  Electronically Signed     ELH/MEDQ  D:  01/07/2009  T:  01/08/2009  Job:  [id number]

## 2010-11-11 NOTE — Consult Note (Signed)
[name], [name]             ACCOUNT NO.:  [id number]   MEDICAL RECORD NO.:  [id number]          PATIENT TYPE:  INP   LOCATION:  A319                          FACILITY:  [facility]   PHYSICIAN:  Cristopher Estimable. Lattie Haw, MD, FACCDATE OF BIRTH:  [birth year]   DATE OF CONSULTATION:  07/31/2008  DATE OF DISCHARGE:                                 CONSULTATION   PRIMARY CARE PHYSICIAN:  Edward L. Luan Pulling, M.D.   NEPHROLOGIST:  Elzie Rings. Lorrene Reid, M.D.   REASON FOR CONSULTATION:  Chest pain.   HISTORY OF PRESENT ILLNESS:  [name] is a 45 year old male patient  with a history of hypertension and diabetes mellitus who was just  discharged from [facility] June 26 after being admitted for  lower extremity cellulitis and chest discomfort.  We evaluated him  during his last admission and ruled out for myocardial infarction by  enzymes.  His echocardiogram demonstrated an EF of 60%, mild to moderate  LVH, RV enlargement, mildly increased RV systolic pressure and probable  organized anterior pericardial effusion.  He underwent dobutamine  echocardiography that demonstrated no ischemia on July 23, 2008.  He  now returns to [facility] after a recurrence of chest  discomfort yesterday while at rest.  He does note a history of nausea  for several days preceding his chest pain that he thinks was made rub  worse by activity.  He denies any chest discomfort with activity.  His  pain yesterday occurred while lying in bed.  It was a heavy, pressure-  type substernal pain with radiation to his left arm, associated nausea  and diaphoresis.  He denies syncope.  His son called EMS and they gave  him sublingual nitroglycerin which helped somewhat.  In the emergency  room he received nitroglycerin again that also helped.  He eventually  had Nitrol paste placed on his chest.  He notes that his chest pain has  never completely resolved since the beginning Work.  Change AV notes  that his chest  discomfort has never completely resolved with since it  began history around 7:30 p.m.  Thus far his cardiac markers have been  negative.  His EKG is nonacute.  We are asked to further evaluate.   PAST MEDICAL HISTORY:  Was reviewed when initial consul on July 20, 2008 was performed.  It is notable for  1. Hypertension.  2. Diabetes mellitus, diet therapy.  3. History of nonfunctioning left kidney with a history of renal      insufficiency.  4. Sleep apnea on CPAP every night.  5. Depression.  6. History of neuropathy.  7. BPH.  8. Gout.  9. Migraine headaches.  10.Attention deficit disorder.  11.History of questionable seizure disorder versus pseudoseizures  12.GERD.  13.Chronic pain syndrome.   MEDICATIONS AT HOME:  1. Clonazepam 3 mg at bedtime.  2. Propranolol 60 mg at bedtime.  3. Prozac 20 mg at bedtime.  4. Neurontin 300 mg three times a day.  5. Prednisone 10 mg daily.  6. Celebrex 200 mg daily.  7. Endocet 10/325 mg q.6 h p.r.n.  8.  Fentanyl patch 75 mcg q.72 h.  9. Nexium 40 mg daily.  10.Flomax 0.4 mg daily.  11.Colchicine 0.6 mg daily.  12.Norvasc 5 mg daily.   ALLERGIES:  IVP DYE, LATEX, PENICILLIN.   FAMILY HISTORY AND SOCIAL HISTORY:  Reviewed on July 20, 2008.  There  have been no changes.   REVIEW OF SYSTEMS:  Please see HPI.  Denies fevers, chills, dysuria,  hematuria, bright red blood per rectum or melena, dysphagia, GERD  symptoms.  He has noticed some cold intolerance.  He sleeps on three to  four pillows and this is chronic.  He notes some pedal edema, but this  is chronic without change.  He has been noting some lightheadedness with  standing at times.  He denies rash.  Rest of the review of systems  negative.   PHYSICAL EXAMINATION:  GENERAL:  He is a well-nourished, well-developed  male.  VITAL SIGNS:  Blood pressure is 149/65, pulse 60, respirations 22,  temperature 98.1, weight 397 pounds.  HEENT is normal.  NECK:  Without JVD.   LYMPH NODES:  Without lymphadenopathy.  ENDOCRINE:  Without thyromegaly.  CARDIAC:  Normal sinus to regular rate and rhythm without murmur.  LUNGS:  Clear to auscultation bilaterally without wheezing, rhonchi or  rales.  SKIN: Without rash.  ABDOMEN:  Soft, nontender without no bowel sounds.  No organomegaly.  EXTREMITIES:  With trace edema bilaterally.  No erythema noted.  MUSCULOSKELETAL:  Without joint deformity.  NEUROLOGIC:  He is alert and oriented x3.  Cranial nerves II-XII grossly  intact.   CHEST X-RAY:  Chest x-ray demonstrates low volume exam with atelectasis,  cardiomegaly without edema.   EKG:  Sinus bradycardia with a heart rate 52, normal axis, no acute  changes.   LABORATORY DATA:  White count 6900, hemoglobin 13.7, platelet count  177,000, potassium 3.9, creatinine 1.35. Point of care troponin-I less  than 0.05, CK 35, CK-MB 0.7, troponin-I 0.01.   ASSESSMENT:  1. Chest pain syndrome in a 45 year old male with a history of      hypertension, diabetes mellitus, remote smoking history that is      suggestive of angina.  His cardiac enzymes have been negative thus      far and his EKGs are normal.  He had a recent dobutamine      echocardiogram that was also normal.  2. History renal insufficiency in the setting of nonfunctioning left      kidney followed chronically by Dr. Jamal Maes in [place].  3. IV contrast allergy.  4. Morbid obesity (the patient weighs 397 pounds).  5. Sleep apnea on CPAP.  6. Depression.  7. Questionable seizure disorder versus pseudoseizures  8. Chronic pain syndrome.  9. Gastroesophageal reflux disease.   RECOMMENDATIONS:  The patient presents with recurrent chest symptoms,  worrisome for angina.  He has significant cardiac risk factors as  outlined above and a recent negative ischemic work-up.  His present  cardiac markers and EKGs are unremarkable.  He also has ongoing pain  since about 7:30 yesterday.  His situation is  complicated by renal  insufficiency, nonfunctioning left kidney as well as obesity and dye  allergy.  He was also interviewed and examined by Dr. Lattie Haw.  As  noted, his symptoms seem consistent with myocardial ischemia, although  his negative EKG during chest discomfort suggests an alternate etiology.  He is already receiving therapy for GERD as well as esophageal spasm  and/or coronary vasospasm.  In light of his recurrent  symptoms, it is  felt that they will likely recur again.  We feel that the patient needs  further workup with cardiac catheterization for definitive diagnosis.  His risk is modestly elevated, but not prohibitive.  Risks and benefits  have been discussed with the patient.  He agrees to proceed.  We will  pretreat him with steroids while at [facility] and start IV  fluids.  We will plan on proceeding with cardiac catheterization  tomorrow at [facility] barring any complications.   Thank you for the consultation.  We will be glad to follow the patient  throughout this admission and handle  transfer to [facility]  tomorrow.      [name], PA-C      Cristopher Estimable. Lattie Haw, MD, [facility]  Electronically Signed    SW/MEDQ  D:  07/31/2008  T:  07/31/2008  Job:  [id number]   cc:   Percell Miller L. Luan Pulling, M.D.  Fax: [place]. Lorrene Reid, M.D.  Fax: [phone]

## 2010-11-11 NOTE — Consult Note (Signed)
NAMESHAMONE, LUCATERO             ACCOUNT NO.:  [id number]   MEDICAL RECORD NO.:  [id number]          PATIENT TYPE:  OBV   LOCATION:  A312                          FACILITY:  [facility]   PHYSICIAN:  Kofi A. Merlene Laughter, M.D. DATE OF BIRTH:  12/10/1965   DATE OF CONSULTATION:  DATE OF DISCHARGE:                                 CONSULTATION   REASON FOR CONSULTATION:  Left leg weakness.   HISTORY OF PRESENT ILLNESS:  This is a 45 year old white male who  presented with severe pain of all the muscles joints.  He was diagnosed  as having gouty arthritis.  The patient has swelling and had difficulty  ambulating.  Apparently was not walking on presentation to the hospital.  The patient also had a paracentesis a few years ago, and the diagnosis  of gout was established.  It appears that the patient had significant  difficulty weightbearing on the left leg even though his other  extremities have improved.  Because of the left leg weakness, this  consultation was obtained.  The patient complained of severe left ankle  pain.  He also had some right and left knee pain.  He has had surgery  done involving the left knee previously.   PAST MEDICAL HISTORY:  Migraines, arthritis, renal insufficiency.   ADMISSION MEDICATIONS:  Celebrex, clonazepam, Maxalt, allopurinol,  propranolol.   ALLERGIES:  1. IODINE,  2. PENICILLIN.  3. LATEX.   SOCIAL HISTORY:  Denies alcohol, tobacco or illicit drug use.   FAMILY HISTORY:  Positive for hypertension.   REVIEW OF SYSTEMS:  Unrevealing other than stated in the history of  present illness.   PHYSICAL EXAMINATION:  GENERAL:  This is a morbidly obese man in no  acute distress.  VITAL SIGNS:  Temperature is 98.9.  Respirations 20.  Pulse 69.  Blood  pressure 145/73.  HEENT EVALUATION:  Neck is large but supple.  Head is normocephalic and  atraumatic.  ABDOMEN:  Obese but soft.  EXTREMITIES:  Mild nonpitting edema.  MENTATION:  The patient is awake and  alert.  He converses well.  Speech,  language and cognition are intact.  NEUROLOGIC:  Cranial nerve evaluation showed pupils equal, round and  reactive to light and accommodation.  Extraocular movements are full.  Visual fields are intact.  Facial muscle tone is symmetric.  Tongue is  midline.  Uvula midline.  Shoulder shrug is normal.  Motor examination  shows 1/5 weakness involving the left lower extremity severely limited  to severe pain involving both the knee and the ankle joint.  The patient  actually also has pain involving the right ankle but to a much lesser  degree.  He has 4+ to 5- strength involving the right leg and normal  strength involving the upper extremities.  Tone and bulk are unrevealing  throughout.  Reflexes are preserved with downgoing plantar reflexes.  Sensation is normal to temperature.  Coordination showed no dysmetrias  or otherwise no tremors or abnormal traits.   ASSESSMENT:  Left lower extremity motor paralysis due to severe pain  from gouty arthritis and other arthritic changes.  RECOMMENDATIONS:  1. Head CT scan to rule out stroke, although actually this is less      likely.  2. Better pain control either with increased dose of Dilaudid and/or      consider injections into the left knee and ankle.      Kofi A. Merlene Laughter, M.D.  Electronically Signed     KAD/MEDQ  D:  09/16/2007  T:  09/16/2007  Job:  [id number]

## 2010-11-11 NOTE — Procedures (Signed)
NAMEZYSHAWN, EICHINGER             ACCOUNT NO.:  [id number]   MEDICAL RECORD NO.:  [id number]          PATIENT TYPE:  INP   LOCATION:  A309                          FACILITY:  [facility]   PHYSICIAN:  Cristopher Estimable. Lattie Haw, MD, FACCDATE OF BIRTH:  21-Mar-1966   DATE OF PROCEDURE:  07/23/2008  DATE OF DISCHARGE:  07/24/2008                                ECHOCARDIOGRAM   CLINICAL DATA:  A 45 year old gentleman with no known coronary artery  disease, who developed chest discomfort in-hospital after admission for  cellulitis.  1. Progressive dobutamine infusion to a peak dose of 40 mcg/kg per      minute resulted in only modest heart rate acceleration.  Atropine 1      mg administered resulting in a peak heart rate of 141, 79% of age-      predicted maximum.  The patient reported severe chest discomfort      during this study.  2. Blood pressure increased from a baseline of 120/70 to a maximum of      160/70 after atropine was administered.  No arrhythmias noted.  3. 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  No significant change during dobutamine infusion.  1. Baseline echocardiogram:  Normal mitral and aortic valve; normal      right ventricular size and function; normal left ventricular size      and function.  Estimated ejection fraction was 65% with segmental      function at the upper limit of normal.  Echocardiographic imaging at peak heart rate:  There was a clear  increase in contractility of all myocardial segments with a decrease in  left ventricular volume.   IMPRESSION:  Negative pharmacologic stress echocardiogram revealing a  somewhat submaximal heart rate despite the administration of 1 mg of  atropine, severe chest discomfort during drug administration without a  electrocardiographic nor echocardiographic verification of myocardial  ischemia; EKG evidence for inferior infarction without any apparent wall  motion abnormality on echocardiography.  Overall, this represents a very  low risk study.      Cristopher Estimable. Lattie Haw, MD, [facility]  Electronically Signed     RMR/MEDQ  D:  07/24/2008  T:  07/24/2008  Job:  [id number]

## 2010-11-11 NOTE — Assessment & Plan Note (Signed)
[place] CARDIOLOGY OFFICE NOTE   NAME:[name], [name]                    MRN:          [id number]  DATE:08/30/2008                            DOB:          Nov 26, 1965    PRIMARY CARE PHYSICIAN:  Percell Miller L. Luan Pulling, MD   CARDIOLOGIST:  Cristopher Estimable. Lattie Haw, MD, [facility]   REASON FOR VISIT:  Posthospitalization followup.   HISTORY OF PRESENT ILLNESS:  [name] is a 45 year old morbidly obese  male who recently was evaluated by our service at [facility] on  2 occasions for chest pain.  Initially, he had a stress echocardiogram  that demonstrated no signs of ischemia.  He then presented back with  current chest symptoms, and given his cardiac risk factors, we sent him  for cardiac catheterization.  The cardiac catheterization was done on  August 02, 2008, and this demonstrated normal coronary arteries.  He  had an echocardiogram prior to that which demonstrated an EF of 60%,  mild-to-moderate LVH, RV was mildly dilated with estimated peak RV  systolic pressure mildly increased.  The patient had a right radial  approach for his heart catheterization.  He was eventually discharged to  home.  He returns to the office today for followup and notes that he  continues to have chest symptoms from time to time.  He also has  shortness of breath associated with this.  He describes it as a sharp  symptom with some tightness.  It does radiate up to his left shoulder.  He does note shortness of breath.  He denies syncope.  He does note that  he sleeps on an incline.  There is no change there.  He denies PND.  He  has had the development of peripheral edema.  Dr. Luan Pulling recently  placed him on Lasix and he apparently had hypokalemia and was  subsequently placed on potassium.  He denies any chest pain associated  with meals.  He did not have any coronary vasospasm on his heart  catheterization.  We placed him on  amlodipine when we first saw him and  he thinks that he may have had some improvement with this medication;  however, he has developed edema since that time as well.   CURRENT MEDICATIONS:  Clonazepam 3 mg nightly, Propranolol 60 mg  nightly, Prozac nightly, Neurontin 300 mg 3 times a day, Prednisone 10  mg daily, Celebrex 200 mg daily,  Fentanyl patch 75 mcg  q.72 hours, Flomax 0.4 mg daily, Nexium 40 mg  daily, Norvasc 5 mg daily, Colchicine 0.6 mg daily, Keppra 500 mg  b.i.d., Furosemide 40 mg b.i.d.,  Potassium 20 mEq b.i.d., Uloric 40 mg daily, Prozac 40 mg daily, Endocet  p.r.n., Hydrocodone p.r.n., Flexeril p.r.n., Nitroglycerin p.r.n.   ALLERGIES:  PENICILLIN and IV CONTRAST.   PHYSICAL EXAMINATION:  GENERAL:  He is a well-nourished, well-developed  obese male in no acute stress.  He is somewhat lethargic.  He tells me  he did not sleep well at all last night.  VITAL SIGNS:  Blood pressure is 120/80, pulse 46, weight 405 pounds.  HEENT:  Normal.  NECK:  JVD.  CARDIAC:  Normal S1 and S2.  Regular rate and rhythm.  LUNGS:  Decreased breath sounds bilaterally.  No obvious rales.  ABDOMEN:  Soft, nontender.  EXTREMITIES:  With 1+ edema bilaterally.  NEUROLOGIC:  He is alert and oriented x3.  Cranial nerves II through XII  are grossly intact.  He is somewhat lethargic.   ASSESSMENT AND PLAN:  1. Chest pain.  As noted above, he had a recent heart catheterization      that showed normal coronary arteries.  It does not appear that he      has had any coronary vasospasm.  He has also had a couple of chest      CTs recently that were negative for pulmonary emboli.  He may      benefit from adjustments in his acid reflux medications.  He may      also benefit from referral to Gastroenterology.  However, I will      leave this up to Dr. Luan Pulling.  Dr. Lattie Haw also saw the patient      today.  He can be seen back in Cardiology on a p.r.n. basis.  2. Sinus bradycardia.  Overall, he  seems to be asymptomatic from this.      He is on propranolol for migraine prophylaxis.  We will change his      propranolol to 40 mg a day.  He can further followup with Dr.      Luan Pulling.  Consideration may need to be given towards changing him      to a different medication for migraine prophylaxis if he continues      to remain bradycardic.   DISPOSITION:  As noted above, the patient was also interviewed and  examined by Dr. Lattie Haw today.  He will follow up with Cardiology  p.r.n.      Patrick Dopp, PA-C  Electronically Signed      Cristopher Estimable. Lattie Haw, MD, [facility]  Electronically Signed   SW/MedQ  DD: 08/30/2008  DT: 08/31/2008  Job #: [id number]   cc:   Percell Miller L. Luan Pulling, M.D.

## 2010-11-11 NOTE — H&P (Signed)
[name], [name]             ACCOUNT NO.:  [id number]   MEDICAL RECORD NO.:  [id number]          PATIENT TYPE:  INP   LOCATION:  [id number]                          FACILITY:  [facility]   PHYSICIAN:  Edward L. Luan Pulling, M.D.DATE OF BIRTH:  1966-03-06   DATE OF ADMISSION:  07/30/2008  DATE OF DISCHARGE:  LH                              HISTORY & PHYSICAL   REASON FOR ADMISSION:  Chest pain.   HISTORY:  Mr. Peary was admitted from the emergency room with chest  discomfort.  He says it started at about 7:30 in the evening yesterday.  He was lying down, and it was very similar to what he had in the past.  At his last admission, he had some of this.  He had an evaluation, which  included a stress echocardiogram that did not show any definite changes  suggesting that he had any cardiac ischemia.  However, he developed  shortness of breath.  There was radiation up into the left shoulder.  The pain, he said, felt like he had a fist in his chest.  EMS was  called.  They gave him nitroglycerin, which helped with his chest pain.  He came to the emergency room and was given nitroglycerin again, which  again helped with his chest pain, but it continued to be something of a  ongoing problem.  He has been admitted because of that.   HISTORY OF PRESENT ILLNESS:  1. Diabetes.  2. Arthritis.  3. Gout.  4. Morbid obesity.  5. Diabetic neuropathy.  6. Renal insufficiency.  7. Possible seizure disorder, although it is not clear that these are      actual seizures versus pseudoseizures.  8. Cellulitis to his leg.  9. Anxiety.   PAST SURGICAL HISTORY:  1. Cholecystectomy.  2. Tonsillectomy.  3. Severe injury to his back and leg, which has ended up causing him      to be disabled.   SOCIAL HISTORY:  He does not use any alcohol.  He does not use any  illicit drugs.  He did smoke in the past but does not now.   He is allergic to LATEX, PENICILLIN, IVP DYE.   HOME MEDICATIONS:  1. Klonopin 3 mg at  bedtime.  2. Propranolol 60 mg at bedtime.  3. Prozac 20 mg at bedtime.  4. Neurontin 300 mg 3 times daily.  5. Prednisone 10 mg daily.  6. Celebrex 200 mg daily.  7. Endocet 10/325 every 6 hours p.r.n. pain.  8. Fentanyl patch 75 mcg every 3 days.  9. Nexium 40 mg daily.  10.Flomax 0.4 daily.  11.Colchicine 0.6 mg daily.  12.Norvasc 5 mg daily.   REVIEW OF SYSTEMS:  Except as mentioned, is essentially negative.   FAMILY HISTORY:  Not positive for coronary disease.  His mother did die  from liver cancer.   PHYSICAL EXAMINATION:  Morbidly obese male who is in no acute distress  now.  Temperature 98.1, pulse 54, respirations 20, blood pressure 149/65, O2  sats 94%, then 98% on 2 liters.  Pupils are reactive.  Nose and throat are clear.  NECK:  Supple without masses.  CHEST:  Clear.  HEART:  Regular without gallop.  ABDOMEN:  Soft, obese.  No masses felt.  Bowel sounds are active.  EXTREMITIES:  Trace edema bilaterally.  The cellulitis that he had  before is cleared, mostly.   His lab work shows, so far, cardiac enzymes are negative.  He did not  have a D-dimer, so I am going to add that.  His lipase is 19.  Complete  metabolic profile shows glucose of 110, BUN 14, creatinine 1.35,  otherwise normal.  CBC shows white count 6900, hemoglobin 13.7,  platelets 177.   Chest x-ray:  Cardiomegaly but no pulmonary edema and low lung volumes.   ASSESSMENT:  He has had a recurrence of chest pain.  He did have a  negative stress echocardiogram, but I am concerned that he is going to  continue having chest discomfort and may end up back and forth in the  hospital.  I am going to ask the cardiologist to see him and see if we  need to do anything more definitively, but it is not clear at this point  whether we are going to be able to get a definitive study or not,  because of his size.      Edward L. Luan Pulling, M.D.  Electronically Signed     ELH/MEDQ  D:  07/31/2008  T:   07/31/2008  Job:  [id number]

## 2010-11-11 NOTE — Group Therapy Note (Signed)
NAMEJOCQUI, DIXON             ACCOUNT NO.:  [id number]   MEDICAL RECORD NO.:  [id number]         PATIENT TYPE:  PINP   LOCATION:  A318                          FACILITY:  [facility]   PHYSICIAN:  Edward L. Luan Pulling, M.D.DATE OF BIRTH:  [birth year]   DATE OF PROCEDURE:  DATE OF DISCHARGE:                                 PROGRESS NOTE   Mr. Coutts is overall about the same.  He did not receive any bed  offers for a skilled care facility.  So, we are going to see if we can  get him perhaps to an assisted living facility.  He understands and  wants to do that.   His physical examination this morning shows that he is awake and alert.  His chest is pretty clear.  His heart is regular.  His abdomen is soft.  He still has subjective weakness of his left.  He still has a Foley  catheter.   My assessment then is that he has had some sort of a neurological event,  some of which may be a conversion reaction.  He does have normal  MRI/MRA.  He has morbid obesity.  He has recurrent chest pain and plan  then is to see if we can get him set up for an assisted living facility.  I am going to try to get him up in a chair, get the Foley catheter out.      Edward L. Luan Pulling, M.D.  Electronically Signed     ELH/MEDQ  D:  01/05/2009  T:  01/05/2009  Job:  [id number]

## 2010-11-11 NOTE — Group Therapy Note (Signed)
[name], [name]             ACCOUNT NO.:  [id number]   MEDICAL RECORD NO.:  [id number]          PATIENT TYPE:  INP   LOCATION:  A309                          FACILITY:  [facility]   PHYSICIAN:  Edward L. Luan Pulling, M.D.DATE OF BIRTH:  October 06, 1965   DATE OF PROCEDURE:  DATE OF DISCHARGE:  11/28/2008                                 PROGRESS NOTE   Mr. Stasi is doing better this morning.  X-ray of his foot showed  arthritic changes mostly nothing that looked like a fracture, etc.  He  says he is not having any more chest pain.  He feels better and he has  no other new complaints.  He has been on 4 L of oxygen here, and I think  that is what he will need when he goes home.   Physical examination otherwise shows that of course, his obesity is  unchanged.  Temperature is 97.3, pulse is in the 30s, respirations 16,  blood pressure 120/45, O2 sat is 96% on 4 L.   ASSESSMENT:  He is, I think, generally better.  Plan is to discharge  home today.  I am hopeful that he will improve once he gets home, and I  am going to see if we can get him perhaps a gastric bypass surgery done  as an outpatient.  He is going to be discharged home on a higher dose of  O2.      Edward L. Luan Pulling, M.D.  Electronically Signed     ELH/MEDQ  D:  11/28/2008  T:  11/29/2008  Job:  [id number]

## 2010-11-11 NOTE — Group Therapy Note (Signed)
[name], [name]             ACCOUNT NO.:  [id number]   MEDICAL RECORD NO.:  [id number]          PATIENT TYPE:  INP   LOCATION:  [id number]                          FACILITY:  [facility]   PHYSICIAN:  Edward L. Luan Pulling, M.D.DATE OF BIRTH:  01-18-1966   DATE OF PROCEDURE:  DATE OF DISCHARGE:                                 PROGRESS NOTE   Mr. Phelan was admitted yesterday with what appeared to be a seizure.  This morning, he is complaining that he still has some weakness of his  left arm and he is subjectively weak on the left.  Direct muscle testing  is equivocal.  He still has stuttering speech.  He has been complaining  of a headache and apparently, although he requested a bedside commode,  he has been defecating in the bed.  He is complaining of some nausea as  well and states that he vomited last night.  He has no other new  complaints.   PHYSICAL EXAMINATION:  VITAL SIGNS:  His temperature is 98.4, pulse 57,  respirations 20, blood pressure 109/67, and O2 sat is 98%.  CHEST:  Fairly clear.  HEART:  Regular without gallop.  ABDOMEN:  Soft.  EXTREMITIES:  No edema.  CENTRAL NERVOUS SYSTEM:  The changes as mentioned.   My assessment then is not totally clear to me what has happened.  He is  too large to be scanned on any of our MRI scanners available except an  outside vendor, and we are going to try to get an MRI/MRA done there,  but it cannot be done until next week.  Dr. Merlene Laughter has seen him and  his help is appreciated.      Edward L. Luan Pulling, M.D.  Electronically Signed     ELH/MEDQ  D:  12/27/2008  T:  12/27/2008  Job:  [id number]

## 2010-11-11 NOTE — H&P (Signed)
NAMEEMMANUEL, BUSLER             ACCOUNT NO.:  [id number]   MEDICAL RECORD NO.:  [id number]          PATIENT TYPE:  INP   LOCATION:  [id number]                          FACILITY:  [facility]   PHYSICIAN:  Edward L. Luan Pulling, M.D.DATE OF BIRTH:  10/08/1965   DATE OF ADMISSION:  12/26/2008  DATE OF DISCHARGE:  LH                              HISTORY & PHYSICAL   This is one of many hospitalizations for [name].  He apparently was  in his usual state of fair health in home when he had a seizure.  He  does not remember any of the details of this, and he is stuttering and  confused now.  According to family, he had a seizure.  I do not know if  it had tonic-clonic activity because his family is not present now.   PAST MEDICAL HISTORY:  1. CHF, but with normal ejection fraction.  2. Diabetes.  3. Anxiety.  4. Arthritis.  5. Gout.  6. Morbid obesity.  7. Diabetic neuropathy.  8. BPH.  9. Renal insufficiency.  10.Seizure disorder.   PAST SURGICAL HISTORY:  1. Cholecystectomy.  2. Tonsillectomy.   SOCIAL HISTORY:  He does not use any alcohol.  He does not use any  illicit drugs.  He smoked in the past.  He has had a history of a severe  back injury that has left him disabled.   MEDICATIONS:  He has been on Keppra at home.   REVIEW OF SYSTEMS:  Not really obtainable right now.   PHYSICAL EXAMINATION:  GENERAL:  He is stuttering.  He is confused.  VITAL SIGNS:  Blood pressure 120/70, pulse 70, respirations 16.  He is  afebrile.  HEENT:  His pupils are reactive.  His nose and throat are clear.  Mucous  membranes are moist.  NECK:  Supple without masses.  NEUROLOGIC:  He has some weakness of the left arm, but otherwise  neurologically he is intact.  HEART:  Regular.  ABDOMEN:  Soft.  EXTREMITIES:  Trace edema.  CNS:  As above.   LABORATORY DATA:  White blood count is 7400, hemoglobin 13.6, platelets  212.  BUN is 11, creatinine 1.18, potassium 4.1.  He had a CT that did  not show  anything acute.   ASSESSMENT:  He has had previous episodes.  It is not clear if these are  in fact seizures, but he has been on Keppra.   PLAN:  My plan is to put him back on the Keppra, ask for an MRI/MRA, and  ask for Dr. Merlene Laughter to see him.      Edward L. Luan Pulling, M.D.  Electronically Signed     ELH/MEDQ  D:  12/26/2008  T:  12/26/2008  Job:  [id number]

## 2010-11-11 NOTE — Group Therapy Note (Signed)
[name], [name]             ACCOUNT NO.:  [id number]   MEDICAL RECORD NO.:  [id number]          PATIENT TYPE:  OBV   LOCATION:  A312                          FACILITY:  [facility]   PHYSICIAN:  Thayer Headings, MD    DATE OF BIRTH:  09-Aug-1965   DATE OF PROCEDURE:  DATE OF DISCHARGE:                                 PROGRESS NOTE   SUBJECTIVE:  The patient feels that his pain is still significant and is  unable to bear weight on his right leg, secondary to his knee swelling.  The patient also continues to complain of difficulty with urination,  which he says an ongoing problem for which she is followed by a  neurologist.   OBJECTIVE:  VITAL SIGNS:  Temperature is 98.6, pulse 68, respirations  20, and blood pressure is 161/91.  GENERAL:  The patient is awake and alert and appears in no acute  distress.  CARDIOVASCULAR:  Regular rate and rhythm with no murmurs, rubs or  gallops.  LUNGS:  Clear to auscultation bilaterally.  ABDOMEN:  Obese, nontender, nondistended, positive bowel sounds and no  hepatosplenomegaly.  EXTREMITIES:  Improved swelling on left elbow, bilateral ankles and  right knee.  No warmth.   ASSESSMENT/PLAN:  1. Gouty arthritis.  With the patient's of renal      insufficiency/proteinuria, will continue to avoid any NSAIDs.  The      patient is on steroids at 40 mg of prednisone a day.  Will continue      with that.  2. Pain control.  The patient continues to be receiving adequate pain      control; however, is still is not bearing weight on his leg.  We      will encourage ambulation.  3. Urinary retention.  The patient reports that he has difficulty      voiding and has had a Foley placed in emergency room, secondary to      urinary retention.  Will have the Foley removed today and check      bladder scan or any urinary retention for residual amount, and we      will replace Foley as needed.  4. Renal insufficiency.  The patient is being followed by [facility] and no acute issues at this time.  5. Migraines.  The patient is on propranolol.      Thayer Headings, MD  Electronically Signed     RWC/MEDQ  D:  09/11/2007  T:  09/11/2007  Job:  [id number]

## 2010-11-11 NOTE — Group Therapy Note (Signed)
[name], DEMO             ACCOUNT NO.:  [id number]   MEDICAL RECORD NO.:  [id number]          PATIENT TYPE:  INP   LOCATION:  [id number]                          FACILITY:  [facility]   PHYSICIAN:  Edward L. Luan Pulling, M.D.DATE OF BIRTH:  11-Feb-1966   DATE OF PROCEDURE:  DATE OF DISCHARGE:                                 PROGRESS NOTE   Mr. Adona is overall, I think, about the same.  He says he is  nauseated.  He had diarrhea and had negative stool for C. difficile.  His MRI was done and does not show anything acute, so I do not think he  has had a stroke.   PHYSICAL EXAMINATION:  VITAL SIGNS:  His temperature is 98.1, pulse 55,  respirations 18, blood pressure 115/70, O2 sats 99% on 2 L.  GENERAL:  He continues to have a stuttering way of speaking.  He looks  better in general.  CHEST:  Clear.   ASSESSMENT:  He has had, what appears to be, a seizure.  There was some  question with stroke, which does not appear to be the case.  He has  multiple other medical problems including diabetes, morbid obesity and  chronic edema.  I think, he needs to be in a skilled care facility.  I  told him that again we are awaiting placement.  There is certainly a  possibility that he is having a conversion reaction as well with nothing  on the MRA or MRI to explain his symptoms.      Edward L. Luan Pulling, M.D.  Electronically Signed     ELH/MEDQ  D:  01/03/2009  T:  01/03/2009  Job:  [id number]

## 2010-11-11 NOTE — Discharge Summary (Signed)
[name], [name]             ACCOUNT NO.:  [id number]   MEDICAL RECORD NO.:  [id number]          PATIENT TYPE:  INP   LOCATION:  [id number]                          FACILITY:  [facility]   PHYSICIAN:  Edward L. Luan Pulling, M.D.DATE OF BIRTH:  [birth year]   DATE OF ADMISSION:  12/26/2008  DATE OF DISCHARGE:  LH                               DISCHARGE SUMMARY   FINAL DISCHARGE DIAGNOSES:  1. Left-sided weakness, possibly from seizure.  2. Seizure disorder.  3. Diabetes.  4. Anxiety.  5. Arthritis.  6. Gout.  7. Morbid obesity.  8. Diabetic nephropathy.  9. Benign prostatic hypertrophy.  10.Renal insufficiency.  11.Diastolic heart failure related to his morbid obesity.  12.Chronic leg pain.  13.Chronic nausea.   HISTORY/HOSPITAL COURSE:  [name] is a 45 year old who came to the  emergency room because of a seizure.  He was in his usual state of poor  health and had what apparently was a seizure, came to the emergency room  with stuttering confused speech and left-sided weakness.  He has  multiple other medical problems.  His physical exam shows that he is  morbidly obese.  His speech was somewhat stuttering in nature.  His  chest fairly clear.  His heart regular without gallop.  His abdomen was  soft, obese without masses.  Extremities showed trace edema.  He had  consultation with Dr. Merlene Laughter, the neurologist and Dr. Merlene Laughter felt  that he should have MRI/MRA which was done at another facility because  he is too large for our MRI machine.  These were normal and it was not  clear if he had a seizure, if he was having a conversion reaction or  exactly what happened.  At any rate over the next several days he  improved.  We attempted to get him skilled care facility placement, but  had no bed offers and at this point he is going to be sent to assisted-  living facility and there he will have home health services including  nursing, PT, OT if needed.  He is discharged home on Klonopin  three at  bedtime, colchicine 0.6 mg b.i.d., Vasotec 5 mg daily, fentanyl patch 75  mcg every 72 hours, Prozac 40 mg daily, Flonase 2 inhalations each  nostril daily, Keppra 500 mg in the morning and 1000 mg at night,  Inderal LA 80 mg at bedtime, Protonix 40 mg b.i.d.,  prednisone 20 mg daily x5 more days then 10 mg daily x5 days and then  stop, Flomax 0.4 mg daily, hydrocodone with APAP 10/325 four times a day  as needed and Zofran 4 mg p.o. q.4 hours p.r.n.  His to be on a diabetic  diet and as mentioned he will need home health services.      Edward L. Luan Pulling, M.D.  Electronically Signed     ELH/MEDQ  D:  01/07/2009  T:  01/07/2009  Job:  [id number]
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
## 2010-11-11 NOTE — Group Therapy Note (Signed)
[name], [name]             ACCOUNT NO.:  [id number]   MEDICAL RECORD NO.:  [id number]          PATIENT TYPE:  INP   LOCATION:  A309                          FACILITY:  [facility]   PHYSICIAN:  Edward L. Luan Pulling, M.D.DATE OF BIRTH:  1965-11-24   DATE OF PROCEDURE:  DATE OF DISCHARGE:                                 PROGRESS NOTE   [name] is better he says.  He still has some shortness of breath.  He has otherwise not had any chest pain and had no other new complaints.   PHYSICAL EXAMINATION:  GENERAL:  He is awake and alert.  VITAL SIGNS:  Temperature 98.3, pulse 56, respirations 20, blood  pressure 148/68, and O2 sats 94% on room air, but it has been lower.  CHEST:  Clear.  EXTREMITIES:  His legs look better and overall I think he is improved.   PLAN:  To continue with his treatment.  We are awaiting Cardiology  opinion about whether he should have further cardiac evaluation or not.  He has sleep apnea.  He has cellulitis of the legs.  He is on  vancomycin.  He has had chest pain.  He has diabetes, which is doing  okay and plan is as above.      Edward L. Luan Pulling, M.D.  Electronically Signed     ELH/MEDQ  D:  07/22/2008  T:  07/22/2008  Job:  [id number]

## 2010-11-11 NOTE — H&P (Signed)
NAMESMOKEY, LENAHAN NO.:  [id number]   MEDICAL RECORD NO.:  [id number]          PATIENT TYPE:  EMS   LOCATION:  MAJO                         FACILITY:  [facility]   PHYSICIAN:  Arlyss Repress, MD        DATE OF BIRTH:  07-Jul-1965   DATE OF ADMISSION:  01/27/2009  DATE OF DISCHARGE:                              HISTORY & PHYSICAL   CHIEF COMPLAINT:  Chest pain, slurred speech.   HISTORY OF PRESENT ILLNESS:  A 45 year old male with a history of  multiple medical conditions, complains of waking up this afternoon with  slurred speech times 1 hour.  He also complained of some chest pain that  started at 6 p.m. that was substernal, stabbing in nature and  associated with some slight nausea.  It radiated to the left shoulder.  The nausea was treated with Zofran and improved.  The patient also noted  that his chest pain was worse with breathing.  He was also associated  with some slight shortness of breath.  He denies any wheezing.  He does  note some dry cough but no fevers or chills.  The patient notes that he  was recently diagnosed with pulmonary embolus.  He does have a family  history of blood clots.  His father had clots in the legs.  The patient  was diagnosed with his PE on January 11, 2009, at [facility].  He  presented today for the above two complaints of slurred speech and chest  pain and shortness of breath.  The patient was treated with morphine  sulfate, per patient, in the ED with some relief of his chest pain.  Electrocardiogram in the ED showed normal sinus rhythm at 55, normal PR  interval, normal QTc, normal axis, Q-wave in II, III, aVF which are old,  no ST elevation, no ST depression, no significant change from December 16, 2008.  Chest x-ray for this patient with chest pain has not yet been  performed in the ED.  We will order and we will see what the results  are.  The patient has not had prior hypercoag workup.  The patient  states he is chest pain  free.  We will admit the patient for slurred  speech and we will obtain a CT brain noncontrast stat as well as admit  him for chest pain workup.   PAST MEDICAL HISTORY:  1. Diabetes.  2. Hypertension.  3. Hyperlipidemia.  4. Chronic kidney disease (per patient).  5. GERD.  6. Hiatal hernia.  7. Seizure disorder.  8. Sleep apnea.  9. Restless leg syndrome.  10.Depression.  11.Question of bipolar disorder.  12.Osteoarthritis.  13.Gout.  14.CHF.  15.COPD.  16.Pulmonary embolus January 11, 2009, at [facility].  17.BPH.  18.History of bradycardia.   PAST SURGICAL HISTORY:  1. Cholecystectomy.  2. Cataract surgery bilateral eyes.  3. Operation on left elbow for a broken humerus.  4. Operation on left knee for a broken tibial plateau, per patient.   SOCIAL HISTORY:  The patient denies any smoking or alcohol use.  He  lives  at [facility] in [place], [place].  He is  under the kind and generous care of [name].   FAMILY HISTORY:  Mother died at age 47 from breast cancer that was  apparently metastatic.  She was a nonsmoker.  Father is alive at age 56.  Siblings none.  The patient states he has a family history of diabetes.  His father apparently had blood clots in the legs.   REVIEW OF SYSTEMS:  Negative for all 10 organ systems except for  pertinent positives stated above.   ALLERGIES:  1. IVP DYE.  2. IODINE.  3. LATEX.  4. PENICILLIN.  5. SHELLFISH.   MEDICATIONS:  Per ER records (the patient cannot recall his  medications).  I have not seen any list from the nursing home (these  medications were apparently taken from a discharge summary that was done  from an admission December 26, 2008.   MEDICATIONS:  1. Flomax 0.4 mg daily.  2. Enalapril 5 mg p.o. daily.  3. Colchicine 0.6 mg p.o. b.i.d.  4. Prozac 40 mg p.o. daily.  5. Omeprazole 20 mg t.i.d.  6. Klonopin 3 mg nightly.  7. Duragesic patch 75 mcg topically q.72 h. I presume.   8. Vicodin 10/325 mg p.o. q.i.d.  9. Reglan 5 mg before meals.  10.Keppra 500 mg p.o. every morning and 1000 mg p.o. nightly.  11.Prednisone taper (the patient states he is on 10 mg a day).  12.Zofran 4 mg IV q.4 h. p.r.n. nausea.   PHYSICAL EXAM:  Temperature 97.2, pulse 57, respiratory rate 13, blood  pressure 118/59, pulse ox 97% on room air.  HEENT: Anicteric, EOMI, no nystagmus, pupils 1.5-mm, symmetric, direct,  consensual, near reflex intact.  Mucous membranes moist.  Small  oropharynx.  NECK:  Thick, no JVD, no bruit, no thyromegaly, no adenopathy.  HEART:  Regular rate, rhythm.  S1-S2.  No murmurs, gallops or rubs.  LUNGS:  Clear to auscultation bilaterally.  ABDOMEN:  Soft, morbidly obese, nontender, nondistended.  Positive bowel  sounds.  EXTREMITIES:  No cyanosis, clubbing or edema, positive venostasis  changes on the distal lower extremities bilaterally, left greater than  right, slight tenderness of the left distal lower extremity around the  mid calf.  LYMPH NODES:  No adenopathy.  NEURO:  Nonfocal, cranial nerves II-XII intact, reflexes 2+, symmetric,  diffuse with downgoing toes bilaterally, motor strength 5/5 in all 4  extremities, pinprick intact.  SKIN:  Scar on the left knee, scar over the left elbow.   LABORATORY DATA:  Sodium 138, potassium 4.1, chloride 103, bicarbonate  28, BUN 31, creatinine 1.4.  D-dimer was negative at 0.42.  PT 27.7, INR  2.4.  WBC 5.0, hemoglobin 12.4, platelet count 164, troponin I is less  than 0.05.  ABG on room air apparently pH 7.337, pCO2 50.6, pO2 86.0,  pulse ox 96% on room air.   ASSESSMENT/PLAN:  1. Slurred speech possibly secondary to transient ischemic attack: The      patient will be placed on aspirin 81 mg by mouth daily and Lipitor      80 mg by mouth nightly.  We will check a lipid panel.  We will      check a hypercoagulation panel, we will check sedimentation rate,      ANA, RPR, TSH, ANA.  We will obtain a CT scan  of the brain stat and      we will obtain an MRI with and without contrast, carotid  ultrasound      with transcranial Doppler and cardiac 2-D echocardiogram.  2. Chest pain:  The emergency room was concerned about recurrent      pulmonary embolus.  However, in light of his pulse ox being 96% or      97% on room air.  In light of his negative D-dimer, in light of his      therapeutic INR this seems somewhat unlikely.  However, we will      obtain a VQ scan in the morning to see if there is any change.  The      patient will be placed on telemetry.  We will check troponin-I      every 8 hours times 3 sets.  We will obtain a pulmonary consult to      further evaluate his pain being from his pulmonary embolus.  We      will obtain a chest x-ray since the patient was complaining in the      emergency department of chest pain.  We will also obtain a      hypercoagulation panel as stated above.  3. Diabetes:  The patient does not appear to be on any diabetic      medications, even though he complains of diabetes.  We will check a      hemoglobin A1c.  We will check fingersticks before meals and      nightly.  We will cover with insulin sliding scale.  4. Hypertension.  The patient will be continued on enalapril 5 mg by      mouth daily.  He appears well controlled on his blood pressure at      this time.  5. Hyperlipidemia:  The patient stated that he had hyperlipidemia but      does not appear to be on any medications at this time.  We will use      Lipitor 80 mg by mouth nightly until we find out if there is any      sign of stroke on imaging.  6. Chronic kidney disease:  The patient stated that he has chronic      kidney disease but his creatinine was 1.4 and within normal limits.      He stated that he had a shriveled up kidney and he had prior      evaluation by [facility] but he could not remember his      nephrologist's name.  We will repeat a CMP in the morning to follow-      up  on his kidney function.  7. Gastroesophageal reflux disease:  The patient will continue with      omeprazole 20 mg three times daily.  8. Seizure disorder:  The patient will continue on Keppra 500 mg every      morning and 1000 mg by mouth every morning.  9. Sleep apnea/restless legs:  Continuous positive airway pressure per      respiratory protocol.  10.Chronic obstructive pulmonary disease:  The patient does not appear      to be on any inhalers.  He does not appear to be any be in any      distress at this time.  We will continue with his prednisone taper      of 10 mg by mouth daily.  11.History of congestive heart failure:  The patient is not on any      diuretics.  I have not had the chance to evaluate  all of his medical records to see what kind of congestive heart      failure he has had in the past.  We will keep his intravenous      fluids at keep vein open.  12.Deep vein thrombosis prophylaxis:  The patient is already on      therapeutic Coumadin.      Arlyss Repress, MD  Electronically Signed     JYK/MEDQ  D:  01/27/2009  T:  01/27/2009  Job:  [id number]   cc:   Ricard Dillon, M.D.

## 2010-11-11 NOTE — Group Therapy Note (Signed)
[name], [name]             ACCOUNT NO.:  [id number]   MEDICAL RECORD NO.:  [id number]          PATIENT TYPE:  INP   LOCATION:  [id number]                          FACILITY:  [facility]   PHYSICIAN:  Edward L. Luan Pulling, M.D.DATE OF BIRTH:  [birth year]   DATE OF PROCEDURE:  DATE OF DISCHARGE:                                 PROGRESS NOTE   [name] was admitted with change in mental status and probable  seizure.  He is still complaining of pain in his right index finger,  which is mildly inflamed.  Otherwise, he is still having some diarrhea,  although I did advance his diet and that seem to have improved.   PHYSICAL EXAMINATION:  GENERAL:  This morning shows he is awake, still  has slow speech.  VITAL SIGNS:  Temperature 97.5, pulse 44, respirations 20, blood  pressure 107/52, and O2 sat is 99% on 2 L.  CHEST:  Clear.  HEART:  Regular.  ABDOMEN:  Intact.  Fairly soft.  His finger is mildly swollen.   My assessment then, he has diarrhea.  We are going to go ahead and check  stools.  He has had a potential seizure.  We are going to see him back  and he is on Keppra.  He may need a nursing home placement and we are  trying to work on eligibility.  PT will reassume treatments tomorrow and  I do not know anything else that we can do to help him at this point.  I  did tell him I want him to get out of bed, but I am concerned regarding  trying to get up without help, because of the potential for a fall.      Edward L. Luan Pulling, M.D.  Electronically Signed     ELH/MEDQ  D:  12/30/2008  T:  12/30/2008  Job:  [id number]

## 2010-11-11 NOTE — Group Therapy Note (Signed)
[name], [name]             ACCOUNT NO.:  [id number]   MEDICAL RECORD NO.:  [id number]          PATIENT TYPE:  OBV   LOCATION:  A306                          FACILITY:  [facility]   PHYSICIAN:  [name], M.D.DATE OF BIRTH:  Jul 17, 1965   DATE OF PROCEDURE:  DATE OF DISCHARGE:  12/10/2008                                 PROGRESS NOTE   [name] is overall about the same.  He is concerned because since he  has been off antiinflammatories, he has more stiffness.  He remains off  his diuresis, and he has had some more swelling also.   His physical exam shows he is awake and alert, obesity is unchanged.  Temperature is 98.6, pulse 61, respirations 20, blood pressure 126/64,  and O2 sat is 98% on 3 L.  His chest is clear.  His heart is regular.  His abdomen soft.  Extremities showed 1+ edema.   ASSESSMENT:  He is better.  He remains on Mobic and his renal function  looks to be okay.  I am hopeful that he can have his barium pill  esophagram and be discharged.      [name], M.D.  Electronically Signed     ELH/MEDQ  D:  12/10/2008  T:  12/11/2008  Job:  [id number]

## 2010-11-11 NOTE — Group Therapy Note (Signed)
[name], [name]             ACCOUNT NO.:  [id number]   MEDICAL RECORD NO.:  [id number]          PATIENT TYPE:  INP   LOCATION:  [id number]                          FACILITY:  [facility]   PHYSICIAN:  Edward L. Luan Pulling, M.D.DATE OF BIRTH:  16-Dec-1965   DATE OF PROCEDURE:  01/04/2009  DATE OF DISCHARGE:                                 PROGRESS NOTE   Mr. Schons is overall about the same.  We are awaiting a bed offer for  him to go a skilled care facility.  He has no new complaints.  He  continues to have pain in his finger.  He continues to be weak.  He  continues to have halting conversational style.  He says that he did not  sleep well last night.   His exam shows his temperature is 96.8, pulse is 47.  He is on a beta-  blocker for migraines.  His respirations are 20, blood pressure 102/55,  O2 sat is 94%.  His chest is clear.  His heart is regular.  He looks  fairly comfortable.   ASSESSMENT:  He has multiple medical problems.  I noticed we are not  doing Accu-Cheks, I have ordered those.  He is diabetic.  He has morbid  obesity and continue with his other treatments and follow.      Edward L. Luan Pulling, M.D.  Electronically Signed     ELH/MEDQ  D:  01/04/2009  T:  01/04/2009  Job:  [id number]

## 2010-11-11 NOTE — Group Therapy Note (Signed)
NAMEHARRISON, [name]             ACCOUNT NO.:  [id number]   MEDICAL RECORD NO.:  [id number]          PATIENT TYPE:  INP   LOCATION:  [id number]                          FACILITY:  [facility]   PHYSICIAN:  Edward L. Luan Pulling, M.D.DATE OF BIRTH:  07-22-1965   DATE OF PROCEDURE:  DATE OF DISCHARGE:                                 PROGRESS NOTE   Mr. Wachtler is still sluggish.  He is still not as able to get around as  before.  He is weak.  He says he still has stuttering talking.   His exam today shows his temperature is 97, pulse 48, respirations 20,  blood pressure 123/68, O2 sats 98% on 2 L.  His chest is fairly clear.  His abdomen is soft.  We do not have any of the stools back yet.  His  extremities showed no edema.  He does have some subjective weakness on  the left, and he still has the stuttering version of talking.   My assessment then is that we still wonder if this is from a seizure,  stroke, perhaps conversion reaction.  He has multiple other medical  problems as well.  He is going to have an MRI/MRA tomorrow and that  should help [place] to try to figure out what is going on.  We are hopeful  that he may be able to be placed in a nursing home, but he has not  filled out all of his paperwork.      Edward L. Luan Pulling, M.D.  Electronically Signed     ELH/MEDQ  D:  01/01/2009  T:  01/01/2009  Job:  [id number]

## 2010-11-11 NOTE — Group Therapy Note (Signed)
NAMEJP, DELANEY             ACCOUNT NO.:  [id number]   MEDICAL RECORD NO.:  [id number]          PATIENT TYPE:  INP   LOCATION:  [id number]                          FACILITY:  [facility]   PHYSICIAN:  Edward L. Luan Pulling, M.D.DATE OF BIRTH:  [birth year]   DATE OF PROCEDURE:  DATE OF DISCHARGE:  08/02/2008                                 PROGRESS NOTE   Mr. Obenour had his discharge held up yesterday because he received the  bolus of dye for his CT chest, but he is being transferred today.  Please see discharge summary for details.  He has had no new complaints  overnight and is stable.      Edward L. Luan Pulling, M.D.  Electronically Signed     ELH/MEDQ  D:  08/02/2008  T:  08/02/2008  Job:  [id number]

## 2010-11-11 NOTE — Group Therapy Note (Signed)
[name], [name]             ACCOUNT NO.:  [id number]   MEDICAL RECORD NO.:  [id number]          PATIENT TYPE:  INP   LOCATION:  A309                          FACILITY:  [facility]   PHYSICIAN:  Edward L. Luan Pulling, M.D.DATE OF BIRTH:  01-Dec-1965   DATE OF PROCEDURE:  DATE OF DISCHARGE:                                 PROGRESS NOTE   [name] seems a little better.  He has had another episode of chest  discomfort.  He is concerned about his leg swelling and he has got  significant leg swelling, but I think a lot of it is chronic.  I had him  do a venous Doppler yesterday that did not show any clots and pretty  good venous function, but I think a lot of it is because of the weight  of his abdominal pannus on his venous system.   His exam today shows his temperature is 97.4, pulse is low in 30s and  40s, respirations 18, blood pressure 108/70, and O2 sat is 99% on 4 L.  His chest is clear.  He looks a little more comfortable.   BMET shows his potassium is 2.9, BUN is 36, and creatinine 1.38.  BNP is  essentially normal.  I am going to have him get a D-dimer.   ASSESSMENT:  He is hypokalemic.  He needs to have that replaced and I am  going to go ahead and start on that.  He has sleep apnea, but he is not  using his continuous positive airway pressure.  I am hoping to get his  family to go home and get his continuous positive airway pressure  machine.  He has marked venous stasis changes and edema.  I think that  is probably related to his body habitus and although he has chest pain,  he has normal coronary arteries.  He is hypokalemic and I am going to  have him go ahead and get some potassium replacement.      Edward L. Luan Pulling, M.D.  Electronically Signed     ELH/MEDQ  D:  11/27/2008  T:  11/28/2008  Job:  [id number]

## 2010-11-11 NOTE — Consult Note (Signed)
[name], [name]             ACCOUNT NO.:  [id number]   MEDICAL RECORD NO.:  [id number]          PATIENT TYPE:  INP   LOCATION:  A318                          FACILITY:  [facility]   PHYSICIAN:  Kofi A. Merlene Laughter, M.D. DATE OF BIRTH:  13-May-1966   DATE OF CONSULTATION:  12/28/2008  DATE OF DISCHARGE:                                 CONSULTATION   The patient reports that his symptoms are unchanged, afebrile.  Vital  signs are stable.  He is sleeping, but easily awakened.  He continues to  have this stuttering type speech.  The left side continues to be weak  about 3/5 and right side shows normal tone, bulk and strength.   ASSESSMENT AND PLAN:  Seizure-like spell with left-sided weakness.  The  patient's Keppra has been increased.  Unclear if the dysarthria and left-  sided weakness are functional or true anatomic problems.  MRI is  pending.      Kofi A. Merlene Laughter, M.D.  Electronically Signed     KAD/MEDQ  D:  12/28/2008  T:  12/29/2008  Job:  [id number]

## 2010-11-11 NOTE — Group Therapy Note (Signed)
NAMEMACGYVER, CHAMPEAU             ACCOUNT NO.:  [id number]   MEDICAL RECORD NO.:  [id number]          PATIENT TYPE:  INP   LOCATION:  [id number]                          FACILITY:  [facility]   PHYSICIAN:  Edward L. Luan Pulling, M.D.DATE OF BIRTH:  [birth year]   DATE OF PROCEDURE:  12/29/2008  DATE OF DISCHARGE:                                 PROGRESS NOTE   Mr. Crisp is overall I think better.  He has no new complaints except  his finger still hurts.  He thinks he has been doing a little bit better  with his food.   His exam this morning shows his temperature is 96.9, pulse 48,  respirations 20, blood pressure 98/62, O2 sats 98%.  He still has a  stuttering speech.  He still complains of weakness which is difficult to  demonstrate objectively.  His chest is clear.  His abdomen is fairly  soft.  His finger is not swollen, but he says it hurts.   My assessment then is that he has had a seizure and perhaps a stroke,  although that history is difficult to be certain about.  He has diabetes  which is stable.  He has abdominal discomfort, nausea, and vomiting  stable and my plan is going to be to advance his diet, continue with his  other treatments for still working on trying to get him an MRI and MRA  done.      Edward L. Luan Pulling, M.D.  Electronically Signed     ELH/MEDQ  D:  12/29/2008  T:  12/30/2008  Job:  [id number]

## 2010-11-11 NOTE — Group Therapy Note (Signed)
[name], NOUN             ACCOUNT NO.:  [id number]   MEDICAL RECORD NO.:  [id number]         PATIENT TYPE:  PINP   LOCATION:  A318                          FACILITY:  [facility]   PHYSICIAN:  Edward L. Luan Pulling, M.D.DATE OF BIRTH:  1966/04/18   DATE OF PROCEDURE:  DATE OF DISCHARGE:                                 PROGRESS NOTE   [name] remains sluggish.  He states he is having severe pain in his  right index finger.  He is still having nausea, still having diarrhea.  He is asking for something stronger for pain, but I am concerned about  doing that, considering the fact that he already he has had some changes  in his mental status and he is more sluggish than he has been.  So, I am  not wanting to push any pain medications at this point.  He had an x-ray  made yesterday that showed some soft tissue swelling, otherwise okay.   PHYSICAL EXAMINATION:  GENERAL:  He remains fairly slow in response.  VITAL SIGNS:  Temperature is 98.7, pulse 60, respirations 18, blood  pressure 116/85, and O2 sats on 2 L.  CHEST:  Clear.  EXTREMITIES:  Finger is swollen.  ABDOMEN:  Soft.   ASSESSMENT:  Then is not totally clear, why he is having all of these,  but with him still having changes in his mental status, I do not think,  we can push any pain medications, etc.  I think I will try him on.  He  has tentatively agreed to nursing home placement which clearly if he  does improve, he is going to need.   PLAN:  Then, I am going to add some prednisone.  He is on CPAP at home,  so he is going to need to get that restarted and he is going to have to  continue with all of his other treatments.  He is going to take 20 mg of  prednisone daily, continue with everything else and follow.      Edward L. Luan Pulling, M.D.  Electronically Signed     ELH/MEDQ  D:  12/28/2008  T:  12/28/2008  Job:  [id number]

## 2010-11-11 NOTE — Group Therapy Note (Signed)
NAMEASUNCION, GUIDER             ACCOUNT NO.:  [id number]   MEDICAL RECORD NO.:  [id number]          PATIENT TYPE:  OBV   LOCATION:  A306                          FACILITY:  [facility]   PHYSICIAN:  Edward L. Luan Pulling, M.D.DATE OF BIRTH:  09/08/1965   DATE OF PROCEDURE:  DATE OF DISCHARGE:                                 PROGRESS NOTE   Mr. Boshears is overall, I think, about the same.  He says he has not had  anymore chest discomfort.  He is not anymore short of breath than he  typically is.  His legs are unchanged, still swollen.   PHYSICAL EXAMINATION:  VITAL SIGNS:  This morning shows that his  temperature is 99.3, pulse 57, respirations 20, blood pressure 106/69,  O2 sats 97% on room air.  CHEST:  Clear.  He does not have a lot of edema.   His renal function is returning towards baseline with a creatinine of  1.73, BUN of 47.   ASSESSMENT:  He is better.   PLAN:  I am going to continue with his treatment.  He is going to have a  barium pill esophagogram tomorrow.  Repeat his BMET in the morning to  make sure that his functions stays okay and probably discharge tomorrow  assuming that his legs do okay, that his renal function remains okay,  that he does not have more chest discomfort etc.      Jasper Loser. Luan Pulling, M.D.  Electronically Signed     ELH/MEDQ  D:  12/09/2008  T:  12/09/2008  Job:  [id number]

## 2010-11-11 NOTE — Group Therapy Note (Signed)
NAMEWILLETT, [name]             ACCOUNT NO.:  [id number]   MEDICAL RECORD NO.:  [id number]          PATIENT TYPE:  INP   LOCATION:  A309                          FACILITY:  [facility]   PHYSICIAN:  Edward L. Luan Pulling, M.D.DATE OF BIRTH:  1966-05-23   DATE OF PROCEDURE:  DATE OF DISCHARGE:  07/24/2008                                 PROGRESS NOTE   [name] is better.  He had a dobutamine stress echo yesterday that  was normal, and he has not had any more chest pain.  His legs are better  but still somewhat swollen.  Otherwise, he is feeling fairly well.   PHYSICAL EXAMINATION:  His physical examination shows that his  temperature is 97.4, pulse 54, respirations 20, blood pressure 108/66,  and O2 sat is 92% on room air, but when he moves it gets down to the  80s, about 82%.   ASSESSMENT:  He has got severe sleep apnea.  He has obesity  hypoventilation.  He has had some chest pain but that seems to have  resolved.  He has chronic pain syndrome, but overall I think he is  better and can be discharged home.  Please see discharge summary for  details.      Edward L. Luan Pulling, M.D.  Electronically Signed     ELH/MEDQ  D:  07/24/2008  T:  07/25/2008  Job:  [id number]

## 2010-11-11 NOTE — Op Note (Signed)
NAME:  [name], [name]             ACCOUNT NO.:  [id number]   MEDICAL RECORD NO.:  [id number]          PATIENT TYPE:  AMB   LOCATION:  DAY                           FACILITY:  [facility]   PHYSICIAN:  R. Garfield Cornea, M.D. DATE OF BIRTH:  25-May-1966   DATE OF PROCEDURE:  12/07/2008  DATE OF DISCHARGE:                               OPERATIVE REPORT   PROCEDURE PERFORMED:  Esophagogastroduodenoscopy with antral biopsy.   INDICATIONS FOR PROCEDURE:  A 45 year old gentleman with chest pain of  uncertain etiology (not likely cardiac) with chronic gastroesophageal  reflux disease symptoms on omeprazole 20 grams orally daily,  intermittent vomiting and esophageal dysphagia.  EGD is now being done.  Potential for esophageal dilation has been discussed.  Risks, benefits  and limitations have been reviewed.  Please see documentation in the  medical record.   PROCEDURE NOTE:  O2 saturation, blood pressure, pulse and respirations  were monitored throughout the entirety of the procedure.  Conscious  sedation was Versed 3 mg IV, Demerol 75 mg IV in divided doses.   INSTRUMENT USED:  Pentax video chip system.   FINDINGS:  Examination of the tubular esophagus revealed a widely patent  normal-appearing tubular esophagus.  Mucosa appeared normal.  Esophagus  patent down through the EG junction.  Stomach:  Gastric cavity was empty, it insufflated well with air.  Thorough examination of gastric mucosa including retroflex in proximal  stomach, esophagogastric junction demonstrated multiple 2-3 mm volcano-  like lesions with central erosions about the antrum.  Please see  photographs.  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  No  infiltrating process, no ulcer, no erosions otherwise seen.  Pylorus was  patent and easily traversed.  Examination of the bulb and second portion  revealed no abnormality.   THERAPEUTIC/DIAGNOSTIC MANEUVERS PERFORMED:  Biopsies of the abnormal  antral mucosa were  taken for histologic study.   The esophagus was not dilated.  The patient tolerated the procedure well  and was reacted in endoscopy.   IMPRESSION:  1. Widely patent, normal-appearing esophagus, not manipulated.  2. 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 otherwise unremarkable      stomach, patent pylorus, normal D1-D2.   RECOMMENDATIONS:  1. Bolster his antireflux regimen.  Stop omeprazole, begin Zegerid 40      grams orally daily.  2. If symptoms of dysphagia and chest pain persist, would consider a      barium pill esophagram as the next step before considering      impedance/pH monitoring.  Of note, his serum lipase and LFTs came      back okay.  From a GI standpoint, he can go home any time.      Bridgette Habermann, M.D.  Electronically Signed     RMR/MEDQ  D:  12/07/2008  T:  12/07/2008  Job:  [id number]   cc:   Percell Miller L. Luan Pulling, M.D.  Fax: [facility], [place], [place]  [place], [place] [address]

## 2010-11-11 NOTE — Consult Note (Signed)
[name], [name]             ACCOUNT NO.:  [id number]   MEDICAL RECORD NO.:  [id number]          PATIENT TYPE:  INP   LOCATION:  A309                          FACILITY:  [facility]   PHYSICIAN:  Kofi A. Merlene Laughter, M.D. DATE OF BIRTH:  1965/10/19   DATE OF CONSULTATION:  DATE OF DISCHARGE:                                 CONSULTATION   REASON FOR CONSULTATION:  Jerking movement, query seizures.   The patient is a 45 year old white male who is known to me from previous  hospitalization.  He presented with what appears to be shaking spells  suspicious for generalized tonic-clonic seizures a few weeks ago.  It  looks like he had a couple other spells which resulted in the patient  coming to the hospital again.  I did speak to the son who reports more  of a myoclonic type general jerky activity.  The patient endorses this.  There may have been blackout/loss of consciousness with these spells of  briefly but no postictal lethargy.  The patient does report having a  history of migraine headaches and complains of having headaches  involving the front and posterior area over the past 3 days or so.  Headache started out mild but has worsened to an 8/10.  He believes his  headaches are somewhat different than his baseline migraine headaches.  He also complains of blurred vision bilaterally.  He underwent  ophthalmic procedures about a month ago.  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  The  patient however missed his follow-up appointment apparently this week.   PAST MEDICAL HISTORY:  1. Significant for questionable seizure disorder.  2. Renal insufficiency.  3. Migraine headaches.  4. Gout.  5. Chronic pain.  6. Depression.  7. Morbid obesity.  8. Obstructive sleep apnea syndrome.   SOCIAL HISTORY:  Lives with son.  No alcohol use.  No tobacco use.  No  illicit drug use.   FAMILY HISTORY:  Positive for hypertension.   REVIEW OF  SYSTEMS:  Positive for some shortness of breath, dyspnea and  chest pain.  He does report having a 60-month history of left leg  weakness.  He does have a large surgical scar from knee surgery but it  is unclear when this was done.   HOME MEDICATIONS:  1. Clonidine 3 mg at bedtime.  2. Propranolol 60 mg at bedtime.  3. Prozac 20 mg once a day.  4. Neurontin 300 mg t.i.d.  5. Prednisone 10 mg.  6. Celebrex 200 mg.  7. Percocet 10/325 q.6h p.r.n.  8. Fentanyl patch 25 mcg q. 3 days.  9. Flomax 0.4 mg.  10.Nexium 40 mg.   ALLERGIES:  PENICILLIN and CONTRAST DYE.   PHYSICAL EXAMINATION:  Shows a massively obese, pleasant gentleman.  He  is in no acute distress.  HEENT EVALUATION:  Neck is supple.  He does have a reddened posterior  pharynx likely from chronic sleep apnea.  Head is normocephalic,  atraumatic.  ABDOMEN:  Obese, was soft.  EXTREMITIES:  No clear edema noted.  There is  mild reddening/erythema of  the distal legs.  NEUROLOGICALLY:  Mentation, he is awake and alert.  He converses well.  No dysarthria is noted.  He is lucid and coherent.  Cranial nerve  evaluation:  Pupils are 5 mm and briskly reactive.  Visual fields are  full.  The patient does have full extraocular movements although on  repeated testing he states that he cannot move his eye because  apparently he is telling it to move but he cannot move it.  This raises  the possibility of apraxia, but given the patient's past history this  may be more psychosomatic.  Funduscopic examination was attempted but I  had a hard time seeing his disks.  Facial muscle strength is symmetric.  Tongue is midline, uvula is midline.  Shoulder shrug is normal.  Motor  examination shows mild left leg weakness both proximally and distally.  Other muscle groups show normal tone, bulk and strength.  There is no  pronator drift.  Reflexes are preserved and normal.  Plantars are both  flexor.  Sensation:  He does report diminished  sensation involving the  bottom of the left sole.   LABORATORY EVALUATION:  Sodium 139, potassium 4.5, chloride 109, CO2 25,  BUN 17, creatinine 1.1, calcium 8.8, glucose 148.  WBC 7.7, hemoglobin  12, platelet count of 191.   ASSESSMENT:  Myoclonic type activity, of unclear etiology.  The patient  has no metabolic derangements to explain these myoclonic jerks.  They  have only occurred twice.  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  I  am concerned about his ophthalmic evaluation and given his past history  I think we should get formal ophthalmological evaluation.  The history  of morbid obesity, headaches and blurred vision raises the possibility  of pseudotumor cerebri.  He does have some left leg weakness and  numbness on the left foot suggestive of neuropathy or possibly even  radiculopathy.   RECOMMENDATIONS:  1. Continue with current care.  2. I want the patient to have a dilated ophthalmic evaluation to see      why he has blurred vision and to evaluate for possible disk edema.      He does have an MRI pending in the outpatient setting as he is over      the weight for the current scanner in the hospital.  Previous      imaging with head CTs have been negative a couple of weeks ago.      Kofi A. Merlene Laughter, M.D.  Electronically Signed     KAD/MEDQ  D:  07/20/2008  T:  07/20/2008  Job:  [id number]

## 2010-11-11 NOTE — Procedures (Signed)
[name], [name]             ACCOUNT NO.:  [id number]   MEDICAL RECORD NO.:  [id number]          PATIENT TYPE:  OUT   LOCATION:  RAD                           FACILITY:  [facility]   PHYSICIAN:  Kofi A. Merlene Laughter, M.D. DATE OF BIRTH:  Oct 18, 1965   DATE OF PROCEDURE:  DATE OF DISCHARGE:                              EEG INTERPRETATION   REFERRING PHYSICIAN:  Tammi Sou, MD   HISTORY:  This is a 45 year old man who presents with spell suspicious  for seizures.   MEDICATION:  Fentanyl, allopurinol, prednisone, colchicine, enalapril,  Prozac, Neurontin, propranolol, oxycodone, and Flomax.   ANALYSIS:  A 16-channel recording is conducted for 28 minutes.  There is  a well-formed posterior rhythm of 11.5 Hz, which attenuates with eye  opening.  Higher beta activity observed in the frontal areas.  There is  no focal or lateralized slowing.  Much of the recording is observed  during stage II sleep with K complexes and sleep spindles observed.  Photic stimulation and hyperventilation are carried out without  significant changes in the background activity.  There is no focal or  lateralized slowing.  There is no epileptiform activity observed.   IMPRESSION:  This is a normal recording in both awake and asleep states.  A single recording does not rule out epileptic seizures.  If clinically  indicated, a sleep-deprived or prolonged recording may be useful.      Kofi A. Merlene Laughter, M.D.  Electronically Signed     KAD/MEDQ  D:  04/25/2008  T:  04/26/2008  Job:  [id number]

## 2010-11-11 NOTE — Group Therapy Note (Signed)
NAMESAYAN, GIESER             ACCOUNT NO.:  [id number]   MEDICAL RECORD NO.:  [id number]          PATIENT TYPE:  INP   LOCATION:  [id number]                          FACILITY:  [facility]   PHYSICIAN:  Edward L. Luan Pulling, M.D.DATE OF BIRTH:  Jan 16, 1966   DATE OF PROCEDURE:  DATE OF DISCHARGE:                                 PROGRESS NOTE   Mr. Cowin is about the same.  He said he had more diarrhea last night  and he did get stool specimens done.  Otherwise, he is about the same.  He still has stuttering vocalization.  He has no other new complaints.  His finger is still painful, but it is no different than it was earlier.   PHYSICAL EXAMINATION:  GENERAL:  He is awake and alert.  His finger is  indeed mildly swollen.  VITAL SIGNS:  Temperature is 97, pulse 48, respirations 18, blood  pressure 112/58, and O2 sats 98% on 2 L.  CHEST:  Fairly clear.  ABDOMEN:  Soft.  EXTREMITIES:  No edema.  CENTRAL NERVOUS SYSTEM:  Grossly intact except for the chronic changes  that we have been talking about with the subjective weakness and the  stuttering.   ASSESSMENT:  Assessment then is that he is about the same.   PLAN:  He is going to have MRI and MRA today.      Edward L. Luan Pulling, M.D.  Electronically Signed     ELH/MEDQ  D:  01/02/2009  T:  01/03/2009  Job:  [id number]

## 2010-11-11 NOTE — Discharge Summary (Signed)
[name], [name]             ACCOUNT NO.:  [id number]   MEDICAL RECORD NO.:  [id number]          PATIENT TYPE:  INP   LOCATION:  3029                         FACILITY:  [facility]   PHYSICIAN:  Domingo Mend, M.D. DATE OF BIRTH:  September 01, 1965   DATE OF ADMISSION:  01/27/2009  DATE OF DISCHARGE:  01/30/2009                               DISCHARGE SUMMARY   DISCHARGE DIAGNOSES:  1. Chest pain, likely secondary to recent diagnosis of pulmonary      embolism.  2. Pulmonary embolism.  3. Deconditioning with hypertension.  4. Type 2 diabetes mellitus.  5. Hyperlipidemia.  6. Morbid obesity.  7. Obstructive sleep apnea.  8. Gastroesophageal reflux disease.  9. Seizure disorder.  10.Restless leg syndrome.  11.Depression.   DISCHARGE MEDICATIONS:  1. Include Coumadin 4 mg daily further adjusted by INR results.  2. Flomax 0.4 mg at bedtime.  3. Enalapril 5 mg daily.  4. Colchicine 0.6 mg twice daily.  5. Prozac 40 mg daily.  6. Omeprazole 20 mg twice daily.  7. Klonopin 3 mg at bedtime.  8. Duragesic patch 75 mcg to change every 72 hours.  9. Reglan 5 mg q.a.c. and nightly.  10.Keppra 500 mg in the morning and 1000 mg at bedtime.  11.Zofran 4 mg every 6 hours as needed for nausea.  12.Vicodin 10/500 mg 1 tablet every 6 hours as needed for pain.   DISPOSITION AND FOLLOW UP:  [name] will be discharged back to his  nursing facility today in stable condition.  I would recommend  rechecking his INR in about 2 to 3 days to further adjust his Coumadin  therapy.   CONSULTATIONS:  None this hospitalization.   IMAGES AND PROCEDURES:  Include a chest x-ray on August 1 that showed no  acute findings.  CT scan of the head without contrast on August 1 that  showed no acute abnormality.  A 2D echocardiogram on August 2.  Ejection  fraction of 55-65%.  No wall motion abnormalities with no diastolic  dysfunction.   HISTORY AND PHYSICAL:  For full details, please see dictation by Dr.  Maudie Mercury  on August 1, but in brief, [name] is a pleasant 45 year old  morbidly obese Caucasian gentleman who has multiple comorbid medical  conditions with a recent diagnosis of pulmonary embolism in [facility].  He came in from his nursing home complaining of chest pain  that started about 6 p.m. the night prior to admission, substernal,  radiated to the left shoulder that improved with Zofran administration.  For this reason, he was brought into the hospital for further evaluation  and management.   HOSPITAL COURSE BY ACTIVE PROBLEM:  1. Chest pain.  He recently, in February, had a cardiac      catheterization that showed normal coronary arteries.  Three sets      of cardiac enzymes have been negative and EKG has not shown any      acute ST-T wave changes.  I believe that his recent cath completely      excludes the possibility of his chest pain being cardiac in origin.  I believe the most likely source of his chest pain is from his      pulmonary embolism that was just recently diagnosed.  He has been      therapeutic throughout his hospitalization on Coumadin.  His chest      pain has resolved by the time of discharge.  2. His hypertension has been well controlled.  3. For his deconditioning, he is to return to his nursing facility for      continued physical and occupational therapy.  4. The rest of his chronic medical issues have not been a problem this      hospitalization.  None of his home medications have been altered.   VITAL SIGNS:  On day of discharge blood pressure 113/66, heart rate  ranging between 49-52, respirations 20, O2 saturation 97% on room air  with a temperature of 97.5.      Domingo Mend, M.D.  Electronically Signed     EH/MEDQ  D:  01/30/2009  T:  01/30/2009  Job:  [id number]

## 2010-11-11 NOTE — H&P (Signed)
[name], [name]             ACCOUNT NO.:  [id number]   MEDICAL RECORD NO.:  [id number]          PATIENT TYPE:  INP   LOCATION:  A309                          FACILITY:  [facility]   PHYSICIAN:  Edward L. Luan Pulling, M.D.DATE OF BIRTH:  October 27, 1965   DATE OF ADMISSION:  07/17/2008  DATE OF DISCHARGE:  LH                              HISTORY & PHYSICAL   REASON FOR ADMISSION:  Swelling of the legs.   HISTORY:  [name] is a 45 year old who was hospitalized earlier this  month with what appeared to be a seizure versus pseudoseizure.  He had  gone home and was doing fairly well when he developed swelling of his  legs and some discoloration of his legs.  This was both legs, right  perhaps a little bit more than the left.  He has had chills for several  days.   PAST MEDICAL HISTORY:  1. Positive for gout.  2. Migraines.  3. Renal insufficiency.  4. Seizure disorder.  5. Chronic pain.  6. Depression.  7. Morbid obesity.  8. He has sleep apnea and uses CPAP at home.   SOCIAL HISTORY:  He does not use alcohol, tobacco or drugs.  He lives at  home with his son.   FAMILY HISTORY:  Positive for hypertension.   REVIEW OF SYSTEMS:  He says that he has had a cut on his right leg that  had healed initially, now appears not to be healed.  He has had some  cough, some shortness of breath and some chest pain.   PHYSICAL EXAMINATION:  GENERAL:  Shows an alert and awake, morbidly  obese male who does not appear to be in any acute distress now.  VITAL SIGNS:  Temperature is 97.7, pulse 59, respirations 18, blood  pressure 140/77.  O2 sats 95% on 2 liters.  Height 75 inches, weight  180.5 kg.  HEENT:  His pupils are reactive.  Mucous membranes are slightly dry.  NECK:  Supple without masses.  He does not have any bruits or  jugulovenous distention.  CHEST:  Fairly clear with decreased breath sounds in general.  HEART:  Regular without gallops.  ABDOMEN:  Soft.  EXTREMITIES:  Showed no  edema.   White blood count 5200, hemoglobin 12.4, platelets 193, PT 14 with an  INR of 1.1, PTT was 31.  BMET shows his BUN is 18, creatinine 1.35.  Cardiac markers were negative.  BNP was 95, which of course is normal  and D dimer is 0.34, which is normal.  He did have a CT chest yesterday  which does not show pulmonary emboli.  He is set for an ultrasound of  the legs today.   MEDICATIONS:  1. Klonopin 3 mg at bedtime.  2. Propanol 60 mg at bedtime.  3. Prozac 20 mg at bedtime.  4. Neurontin 300 mg three times a day.  5. Prednisone 10 mg daily.  6. Celebrex 200 mg daily.  7. Endocet 10/325 q.6 h., p.r.n. pain.  8. Fentanyl patch 25 mcg every 72 hours.  9. Flomax 0.4 daily.  10.Nexium 400 mg daily.  11.__________  40 mg daily.   ALLERGIES:  1. PENICILLIN.  2. CONTRAST MEDIA.   ASSESSMENT:  Leg swelling and very well may have a clot in his leg.  We  are awaiting the result of his ultrasound.  He has sleep apnea.  He has  morbid obesity.  He has a seizure versus pseudoseizure.  He has been  seen by Dr. Merlene Laughter, the neurologist.  He does have some what I think  is cellulitis of the leg.  He is going to be on vancomycin for that  until we have an idea whether he actually has a clot or not.      Edward L. Luan Pulling, M.D.  Electronically Signed     ELH/MEDQ  D:  07/18/2008  T:  07/18/2008  Job:  [id number]

## 2010-11-11 NOTE — Discharge Summary (Signed)
[name], [name]             ACCOUNT NO.:  [id number]   MEDICAL RECORD NO.:  [id number]          PATIENT TYPE:  OBV   LOCATION:  A312                          FACILITY:  [facility]   PHYSICIAN:  Salem Caster, DO    DATE OF BIRTH:  1965/10/27   DATE OF ADMISSION:  09/10/2007  DATE OF DISCHARGE:  03/19/2009LH                               DISCHARGE SUMMARY   DISCHARGE DIAGNOSES:  1. Gout.  2. History of migraines.  3. History of renal insufficiency.   HISTORY OF PRESENT ILLNESS:  Please see H&P done by Dr. Linus Salmons on September 10, 2007.  This is a 45 year old man with history of gout that was  diagnosed by paracentesis a few years back.  Patient presented with  joint swelling and main, seemed like a usual gout attack, which he  states was worse then typical.  Patient reported he was on allopurinol  but he has been trying to taper it down but reported every time he  tapered his allopurinol, he had a gout flare.  Patient presented.  His  uric acid level was 7.8.  He had a glucose of 146.  White blood cell  count 11.8 with 84 neutrophils, hemoglobin 14, platelets 359.  His UA  was significant for proteinuria.  Patient was admitted and started on  prednisone.  We avoided NSAIDs due to his renal insufficiency and also  avoided his Celebrex.  The patient seemed to be improving.  Patient was  on IV pain medications, IV fluids and his ambulation has been slow.  Patient has been receiving physical therapy and patient has been  difficult to assess his ambulatory status.  Patient stated his pain was  6-7 at rest and a 10 when moving.  PT states that his pain level was  difficult to evaluate, perhaps stress at home was magnifying his  symptoms.  Patient does live alone and has been discussed regarding  possible placement at a rehab facility.  Patient does not want to go to  rehab if he does not have to.  He would rather go home.  At this time,  this patient medically is stable so that he could be  discharged.  Will  again speak to the patient regarding possible rehab, otherwise patient  will be discharged to home with follow-up with his primary care  physician.   HOME MEDICATIONS AT DISCHARGE:  1. Clonazepam 3 mg at night.  2. Maxalt as needed.  3. Propranolol 160 mg nightly.  4. He was placed on a prednisone taper dose.  5. Flomax 0.4 mg daily.  6. Oxycodone 15 mg q.4h. p.r.n.  7. For now will hold his Celebrex.  8. Concerta 54 mg two tablets daily.  9. Allopurinol 100 mg daily.  10.Nexium 40 mg daily.  11.Gabapentin 300 mg t.i.d.  12.Tylenol OTC p.r.n.   VITAL SIGNS:  Temperature 99, pulse 59, respirations 20, blood pressure  125/63.   LABORATORY DATA:  Last labs on September 13, 2007, showed sodium 136,  potassium 4.1, chloride 103, CO2 27, glucose 118, BUN 18, creatinine  1.04.  White count 11.7, hemoglobin 13.5,  hematocrit 38.8, platelets  401.  Last uric acid was 7.8.   CONDITION ON DISCHARGE:  Stable.   DISPOSITION:  At this time, patient will be discharged to home.  Patient  will need possible home health with some home physical therapy.   DISCHARGE INSTRUCTIONS:  Patient is to follow up with his PCP in the  next 5-7 days.  Patient to maintain low sodium, heart healthy diet.  He  is to increase his activity slow with instructions with some home health  and physical therapy.  Patient told to return to the emergency room if  any severe pain, otherwise follow up with his primary care physician.  Patient to be discharged on September 15, 2007, in the a.m.      Salem Caster, DO  Electronically Signed     SM/MEDQ  D:  09/14/2007  T:  09/14/2007  Job:  [id number]

## 2010-11-11 NOTE — Group Therapy Note (Signed)
NAMELADEN, PRESSWOOD             ACCOUNT NO.:  [id number]   MEDICAL RECORD NO.:  [id number]          PATIENT TYPE:  INP   LOCATION:  [id number]                          FACILITY:  [facility]   PHYSICIAN:  Edward L. Luan Pulling, M.D.DATE OF BIRTH:  1966/06/10   DATE OF PROCEDURE:  01/07/2009  DATE OF DISCHARGE:                                 PROGRESS NOTE   Mr. Ackerman is overall about the same.  He states he still has some  abdominal discomfort and some nausea.  He has still some left-sided  weakness, but he seems to be better.   His exam shows that his temperature is 96.8, pulse 56, respirations 18,  blood pressure 118/56, and O2 sat is 98%.  He is on 2 L at that time.  He was also on BiPAP.  His chest is clearer.  His heart is regular.  He  looks comfortable and I am hopeful to have him ready for transfer to an  assisted living facility today.      Edward L. Luan Pulling, M.D.  Electronically Signed     ELH/MEDQ  D:  01/07/2009  T:  01/08/2009  Job:  [id number]

## 2010-11-11 NOTE — Group Therapy Note (Signed)
[name], [name]             ACCOUNT NO.:  [id number]   MEDICAL RECORD NO.:  [id number]          PATIENT TYPE:  INP   LOCATION:  A318                          FACILITY:  [facility]   PHYSICIAN:  Kofi A. Merlene Laughter, M.D. DATE OF BIRTH:  1966-03-12   DATE OF PROCEDURE:  DATE OF DISCHARGE:                                 PROGRESS NOTE   The patient has no new complaints today, he continues to have left-sided  weakness 3/5 with some stuttering speech.   Temperature 98.1, pulse 55, respirations 20, and blood pressure 115/70.   MRI of the brain is reported as normal and MRA is also reported as  unrevealing.   ASSESSMENT AND PLAN:  Neurological symptoms with no clear anatomical  abnormalities, this likely represent the functional problem.  Agree with  physical therapy and psychological evaluation and counseling.      Kofi A. Merlene Laughter, M.D.  Electronically Signed     KAD/MEDQ  D:  01/03/2009  T:  01/03/2009  Job:  [id number]

## 2010-11-11 NOTE — Group Therapy Note (Signed)
NAMEGRACEN, [name]             ACCOUNT NO.:  [id number]   MEDICAL RECORD NO.:  [id number]          PATIENT TYPE:  INP   LOCATION:  [id number]                          FACILITY:  [facility]   PHYSICIAN:  Edward L. Luan Pulling, M.D.DATE OF BIRTH:  Dec 09, 1965   DATE OF PROCEDURE:  DATE OF DISCHARGE:                                 PROGRESS NOTE   [name] says he is still having some problems with nausea.  His  abdomen is soft, however.  He does not have any tenderness, and he looks  pretty comfortable.  He has no other new complaints.  We are still  hoping for a bed offer for him from assisted-living center, family care  home, etc.  He has given up his apartment and has nowhere to go if we  cannot find him a bed.   His exam today shows his temperature is 98.1, pulse 45, respirations 18,  blood pressure 120/65, O2 sats 97%.  His chest is clear.  His heart is  regular.  His abdomen is soft.   As mentioned, his obesity of course is unchanged.   Plan then no changes in his meds, continue with his treatments, and  hopefully we will be able to find him a bed.      Edward L. Luan Pulling, M.D.  Electronically Signed     ELH/MEDQ  D:  01/09/2009  T:  01/09/2009  Job:  [id number]

## 2010-11-11 NOTE — Group Therapy Note (Signed)
NAMERODERICH, COCKCROFT             ACCOUNT NO.:  [id number]   MEDICAL RECORD NO.:  [id number]          PATIENT TYPE:  INP   LOCATION:  [id number]                          FACILITY:  [facility]   PHYSICIAN:  Edward L. Luan Pulling, M.D.DATE OF BIRTH:  December 25, 1965   DATE OF PROCEDURE:  DATE OF DISCHARGE:                                 PROGRESS NOTE   PROBLEM:  Chest pain.   SUBJECTIVE:  [name] is better and has had no more chest pain.  He  is on nitroglycerin drip.  Cardiology consult is noted and appreciated.  His D-dimer was elevated, so he had a CT chest done this morning and it  is negative for pulmonary emboli.  Otherwise, he states he is feeling  okay and he has no other new complaints.   PHYSICAL EXAMINATION:  GENERAL:  He is an obese male who looks  comfortable.  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  CHEST:  Clear.  HEART:  Regular without gallop.  ABDOMEN:  Soft.  EXTREMITIES:  No edema.  He has had cellulitis of his legs and he has  some chronic venous stasis changes.  VITAL SIGNS:  His temperature is 98, pulse is 46, respirations 20, blood  pressure 127/46, and O2 sat is 93% on room air.  HEART:  Regular and otherwise he is okay.   At this point, the plan is he is to be transferred to [facility] for cardiac catheterization to try to define whether he does  indeed have cardiac disease causing his chest pain or not.  Otherwise, I  will continue with his meds and have him reevaluated after we have the  cardiac catheterization.  His CT was negative for pulmonary emboli.      Edward L. Luan Pulling, M.D.  Electronically Signed     ELH/MEDQ  D:  08/01/2008  T:  08/01/2008  Job:  [id number]

## 2010-11-11 NOTE — Group Therapy Note (Signed)
[name], [name]             ACCOUNT NO.:  [id number]   MEDICAL RECORD NO.:  [id number]          PATIENT TYPE:  INP   LOCATION:  [id number]                          FACILITY:  [facility]   PHYSICIAN:  Edward L. Luan Pulling, M.D.DATE OF BIRTH:  [birth year]   DATE OF PROCEDURE:  DATE OF DISCHARGE:                                 PROGRESS NOTE   [name] is about the same.  He continues to complain of right index  finger pain.  He is on colchicine.  He is on prednisone.  He is back on  his Uloric.  He says he is having trouble feeding himself because of the  pain in his finger and he cannot grip and he is having trouble with his  left hand because of his weakness on the left.  He is otherwise about  the same.  He has no new complaints.   His physical examination shows that his temperature is 97.3, pulse 48,  respirations 16, blood pressure 104/64, O2 sats 99% on 2 L.  He has  subjective weakness of the left side.  His heart is regular.  His  abdomen is soft, obese without masses.  He has pending stool studies.   ASSESSMENT:  He has a seizure versus stroke versus conversion disorder.  He has finger pain.  He has morbid obesity.  He has chronic low back  pain.  He has had multiple bouts of chest pain, but normal coronary  arteries.  My assessment then is that he is I think about the same.   PLAN:  We are working on skilled care facility placement.      Edward L. Luan Pulling, M.D.  Electronically Signed     ELH/MEDQ  D:  12/31/2008  T:  12/31/2008  Job:  [id number]

## 2010-11-11 NOTE — Group Therapy Note (Signed)
NAMEDOTSON, GRASER             ACCOUNT NO.:  [id number]   MEDICAL RECORD NO.:  [id number]          PATIENT TYPE:  INP   LOCATION:  [id number]                          FACILITY:  [facility]   PHYSICIAN:  Edward L. Luan Pulling, M.D.DATE OF BIRTH:  01-16-1966   DATE OF PROCEDURE:  DATE OF DISCHARGE:                                 PROGRESS NOTE   [name] is about the same.  He continues to have nausea.  He has no  other new complaints.   PHYSICAL EXAMINATION:  GENERAL:  His physical exam this morning shows,  he is awake and alert.  VITAL SIGNS:  Temperature is 97.7, pulse 86, respirations 18, blood  pressure 130/68, and O2 sats 92% on room air.  CHEST:  Clearer than it has been.  HEART:  Regular.  ABDOMEN:  Soft.   ASSESSMENT:  He has chronic nausea.  I think he may have some element of  gastroparesis.  He and I have discussed that and discussed the use of  Reglan including potential side effects and problems associated with  Reglan and he wants go ahead and try and see if it makes any difference  and I have ordered Reglan 5 mg a.c. and bedtime.  He is ready for  discharge when we can find an opportunity for him.      Edward L. Luan Pulling, M.D.  Electronically Signed     ELH/MEDQ  D:  01/08/2009  T:  01/08/2009  Job:  [id number]

## 2010-11-14 NOTE — Procedures (Signed)
[name], [name]             ACCOUNT NO.:  [id number]   MEDICAL RECORD NO.:  [id number]          PATIENT TYPE:  OUT   LOCATION:  SLEEP LAB                     FACILITY:  [facility]   PHYSICIAN:  Danton Sewer, M.D. [facility] DATE OF BIRTH:  1965-09-01   DATE OF STUDY:  08/14/2005                              NOCTURNAL POLYSOMNOGRAM   REFERRING PHYSICIAN:  Dr. Shawnie Dapper.   DATE OF STUDY:  August 14, 2005.   INDICATION FOR STUDY:  Hypersomnia with sleep apnea.   EPWORTH SCORE:  16.   SLEEP ARCHITECTURE:  The patient had a total sleep time of 404 minutes with  significantly decreased slow wave sleep and REM. Sleep onset latency was  normal and REM onset was normal as well. Sleep efficiency was fairly good at  93%.   RESPIRATORY DATA:  The patient was found to have 50 hypopneas and 3 apneas  for a respiratory disturbance index of 8 events per hour. The events did not  appeared to be positional but there was moderate to loud snoring noted  throughout. The patient did not meet split night criteria secondary to the  small numbers of events during the first half of the night. The events  clearly worsened in the early morning hours, and especially during REM.   OXYGEN DATA:  The patient had O2 desaturation as low as 65% with his  obstructive events during one particular REM. However, this was transitory.  Overall, the patient did spend approximately 113 minutes during the night in  the range of 81-90% O2 saturation.   CARDIAC DATA:  Rare PVCs were noted.   MOVEMENT/PARASOMNIA:  The patient was found to have 189 leg jerks with 7 per  hour resulting in arousal or awakening.   IMPRESSION/RECOMMENDATIONS:  1.  Mild obstructive sleep apnea/hypopnea syndrome with a respiratory      disturbance index of 8 events per hour and O2 desaturation as low as 65%      during REM events. Treatment for this degree of sleep apnea may include      weight loss alone, upper airway surgery, oral  appliance, and also C-PAP.      Given the significant amount of time spent in the 81-90% O2 saturation      range, I would lean towards treating this patient's sleep apnea more      aggressively.  2.  Large numbers of leg jerks with significant sleep disruption. This may      simply be due to the patient's      obstructive apnea; however, he may have a concomitant disorder such as      the restless leg syndrome or periodic leg movement syndrome. Clinical      correlation is suggested.                                            ______________________________  Danton Sewer, M.D. [facility]  Diplomate, American Board of Sleep  Medicine     KC/MEDQ  D:  08/24/2005 17:02:53  T:  08/25/2005 01:18:38  Job:  [id number]

## 2011-03-23 LAB — URINALYSIS, ROUTINE W REFLEX MICROSCOPIC
Glucose, UA: NEGATIVE
Leukocytes, UA: NEGATIVE
Nitrite: NEGATIVE
Protein, ur: >300 — AB
Specific Gravity, Urine: >1.03 — ABNORMAL HIGH
Urobilinogen, UA: 0.2
pH: 6

## 2011-03-23 LAB — CBC
HCT: 41.6
HCT: 38.4 — ABNORMAL LOW
HCT: 38.8 — ABNORMAL LOW
HCT: 39.1
Hemoglobin: 14.3
Hemoglobin: 13.3
Hemoglobin: 13.3
Hemoglobin: 13.5
MCHC: 34.3
MCHC: 34.2
MCHC: 34.5
MCHC: 34.7
MCV: 85.6
MCV: 85.8
MCV: 86.3
MCV: 86.7
Platelets: 359
Platelets: 383
Platelets: 401 — ABNORMAL HIGH
Platelets: 426 — ABNORMAL HIGH
RBC: 4.86
RBC: 4.48
RBC: 4.5
RBC: 4.51
RDW: 13.7
RDW: 13
RDW: 13.1
RDW: 13.2
WBC: 11.8 — ABNORMAL HIGH
WBC: 11.7 — ABNORMAL HIGH
WBC: 12.6 — ABNORMAL HIGH
WBC: 14.1 — ABNORMAL HIGH

## 2011-03-23 LAB — DIFFERENTIAL
Basophils Absolute: 0.1
Basophils Absolute: 0
Basophils Absolute: 0.1
Basophils Relative: 1
Basophils Relative: 0
Basophils Relative: 1
Eosinophils Absolute: 0
Eosinophils Absolute: 0
Eosinophils Absolute: 0
Eosinophils Relative: 0
Eosinophils Relative: 0
Eosinophils Relative: 0
Lymphocytes Relative: 8 — ABNORMAL LOW
Lymphocytes Relative: 13
Lymphocytes Relative: 20
Lymphs Abs: 0.9
Lymphs Abs: 1.6
Lymphs Abs: 2.4
Monocytes Absolute: 1
Monocytes Absolute: 1.3 — ABNORMAL HIGH
Monocytes Absolute: 1.4 — ABNORMAL HIGH
Monocytes Relative: 8
Monocytes Relative: 11
Monocytes Relative: 11
Neutro Abs: 9.9 — ABNORMAL HIGH
Neutro Abs: 7.9 — ABNORMAL HIGH
Neutro Abs: 9.5 — ABNORMAL HIGH
Neutrophils Relative %: 84 — ABNORMAL HIGH
Neutrophils Relative %: 68
Neutrophils Relative %: 75

## 2011-03-23 LAB — BASIC METABOLIC PANEL
BUN: 8
BUN: 15
BUN: 18
BUN: 23
CO2: 27
CO2: 26
CO2: 27
CO2: 29
Calcium: 9.2
Calcium: 8.8
Calcium: 8.9
Calcium: 9
Chloride: 102
Chloride: 101
Chloride: 103
Chloride: 98
Creatinine, Ser: 1.19
Creatinine, Ser: 1.04
Creatinine, Ser: 1.07
Creatinine, Ser: 1.18
GFR calc Af Amer: >60
GFR calc Af Amer: >60
GFR calc Af Amer: >60
GFR calc Af Amer: >60
GFR calc non Af Amer: >60
GFR calc non Af Amer: >60
GFR calc non Af Amer: >60
GFR calc non Af Amer: >60
Glucose, Bld: 146 — ABNORMAL HIGH
Glucose, Bld: 118 — ABNORMAL HIGH
Glucose, Bld: 148 — ABNORMAL HIGH
Glucose, Bld: 91
Potassium: 4.1
Potassium: 4.1
Potassium: 4.2
Potassium: 4.3
Sodium: 137
Sodium: 134 — ABNORMAL LOW
Sodium: 135
Sodium: 136

## 2011-03-23 LAB — URIC ACID
Uric Acid, Serum: 7.8
Uric Acid, Serum: 6.9

## 2011-03-23 LAB — URINE MICROSCOPIC-ADD ON

## 2011-03-23 LAB — RPR: RPR Ser Ql: NONREACTIVE

## 2011-03-23 LAB — VITAMIN B12: Vitamin B-12: 459 (ref 211–911)

## 2011-03-24 LAB — CBC
HCT: 39.3
Hemoglobin: 13.3
MCHC: 33.8
MCV: 85.9
Platelets: 339
RBC: 4.58
RDW: 13.9
WBC: 7.9

## 2011-03-24 LAB — DIFFERENTIAL
Basophils Absolute: 0.2 — ABNORMAL HIGH
Basophils Relative: 2 — ABNORMAL HIGH
Eosinophils Absolute: 0
Eosinophils Relative: 1
Lymphocytes Relative: 44
Lymphs Abs: 3.5
Monocytes Absolute: 0.6
Monocytes Relative: 7
Neutro Abs: 3.6
Neutrophils Relative %: 46

## 2011-03-24 LAB — POCT CARDIAC MARKERS
CKMB, poc: 1.3
CKMB, poc: <1 — ABNORMAL LOW
Myoglobin, poc: 128
Myoglobin, poc: 86
Operator id: 257131
Operator id: 272551
Troponin i, poc: <0.05
Troponin i, poc: <0.05

## 2011-03-24 LAB — POCT I-STAT, CHEM 8
BUN: 25 — ABNORMAL HIGH
Calcium, Ion: 1.17
Chloride: 103
Creatinine, Ser: 1.2
Glucose, Bld: 96
HCT: 42
Hemoglobin: 14.3
Potassium: 3.7
Sodium: 139
TCO2: 26

## 2011-03-24 LAB — D-DIMER, QUANTITATIVE: D-Dimer, Quant: 1.14 — ABNORMAL HIGH

## 2011-03-24 LAB — APTT: aPTT: 24

## 2011-03-31 LAB — HEMOGLOBIN AND HEMATOCRIT, BLOOD
HCT: 37.9 — ABNORMAL LOW
Hemoglobin: 12.6 — ABNORMAL LOW

## 2011-03-31 LAB — BASIC METABOLIC PANEL
BUN: 29 — ABNORMAL HIGH
CO2: 27
Calcium: 8.7
Chloride: 104
Creatinine, Ser: 1.16
GFR calc Af Amer: >60
GFR calc non Af Amer: >60
Glucose, Bld: 108 — ABNORMAL HIGH
Potassium: 4.2
Sodium: 138

## 2011-04-03 LAB — CBC
HCT: 40.8 % (ref 39.0–52.0)
HCT: 42.4 % (ref 39.0–52.0)
HCT: 41.7
Hemoglobin: 13.6 g/dL (ref 13.0–17.0)
Hemoglobin: 14 g/dL (ref 13.0–17.0)
Hemoglobin: 13.9
MCHC: 33 g/dL (ref 30.0–36.0)
MCHC: 33.2 g/dL (ref 30.0–36.0)
MCHC: 33.4
MCV: 89.4 fL (ref 78.0–100.0)
MCV: 89.5 fL (ref 78.0–100.0)
MCV: 87.4
Platelets: 176 10*3/uL (ref 150–400)
Platelets: 307 10*3/uL (ref 150–400)
Platelets: 245
RBC: 4.56 MIL/uL (ref 4.22–5.81)
RBC: 4.75 MIL/uL (ref 4.22–5.81)
RBC: 4.77
RDW: 13.6 % (ref 11.5–15.5)
RDW: 13.7 % (ref 11.5–15.5)
RDW: 13.6
WBC: 10.3 10*3/uL (ref 4.0–10.5)
WBC: 10.5 10*3/uL (ref 4.0–10.5)
WBC: 10.2

## 2011-04-03 LAB — URINALYSIS, ROUTINE W REFLEX MICROSCOPIC
Bilirubin Urine: NEGATIVE
Glucose, UA: NEGATIVE
Ketones, ur: NEGATIVE
Leukocytes, UA: NEGATIVE
Nitrite: NEGATIVE
Protein, ur: 30 — AB
Specific Gravity, Urine: 1.025
Urobilinogen, UA: 0.2
pH: 5.5

## 2011-04-03 LAB — BASIC METABOLIC PANEL
BUN: 23 mg/dL (ref 6–23)
BUN: 29 mg/dL — ABNORMAL HIGH (ref 6–23)
BUN: 19
CO2: 24 mEq/L (ref 19–32)
CO2: 25 mEq/L (ref 19–32)
CO2: 29
Calcium: 8.5 mg/dL (ref 8.4–10.5)
Calcium: 9.3 mg/dL (ref 8.4–10.5)
Calcium: 8.4
Chloride: 106 mEq/L (ref 96–112)
Chloride: 107 mEq/L (ref 96–112)
Chloride: 104
Creatinine, Ser: 1.15 mg/dL (ref 0.4–1.5)
Creatinine, Ser: 1.19 mg/dL (ref 0.4–1.5)
Creatinine, Ser: 1.36
GFR calc Af Amer: >60 mL/min (ref >60)
GFR calc Af Amer: >60 mL/min (ref >60)
GFR calc Af Amer: >60
GFR calc non Af Amer: >60 mL/min (ref >60)
GFR calc non Af Amer: >60 mL/min (ref >60)
GFR calc non Af Amer: 58 — ABNORMAL LOW
Glucose, Bld: 128 mg/dL — ABNORMAL HIGH (ref 70–99)
Glucose, Bld: 139 mg/dL — ABNORMAL HIGH (ref 70–99)
Glucose, Bld: 117 — ABNORMAL HIGH
Potassium: 3.9 mEq/L (ref 3.5–5.1)
Potassium: 5 mEq/L (ref 3.5–5.1)
Potassium: 4.2
Sodium: 135 mEq/L (ref 135–145)
Sodium: 136 mEq/L (ref 135–145)
Sodium: 137

## 2011-04-03 LAB — DIFFERENTIAL
Basophils Absolute: 0 10*3/uL (ref 0.0–0.1)
Basophils Absolute: 0 10*3/uL (ref 0.0–0.1)
Basophils Absolute: 0
Basophils Relative: 0 % (ref 0–1)
Basophils Relative: 0 % (ref 0–1)
Basophils Relative: 0
Eosinophils Absolute: 0 10*3/uL (ref 0.0–0.7)
Eosinophils Absolute: 0.2 10*3/uL (ref 0.0–0.7)
Eosinophils Absolute: 0.3
Eosinophils Relative: 0 % (ref 0–5)
Eosinophils Relative: 2 % (ref 0–5)
Eosinophils Relative: 3
Lymphocytes Relative: 12 % (ref 12–46)
Lymphocytes Relative: 14 % (ref 12–46)
Lymphocytes Relative: 19
Lymphs Abs: 1.2 10*3/uL (ref 0.7–4.0)
Lymphs Abs: 1.5 10*3/uL (ref 0.7–4.0)
Lymphs Abs: 1.9
Monocytes Absolute: 0.3 10*3/uL (ref 0.1–1.0)
Monocytes Absolute: 0.8 10*3/uL (ref 0.1–1.0)
Monocytes Absolute: 0.8
Monocytes Relative: 3 % (ref 3–12)
Monocytes Relative: 7 % (ref 3–12)
Monocytes Relative: 8
Neutro Abs: 8.1 10*3/uL — ABNORMAL HIGH (ref 1.7–7.7)
Neutro Abs: 8.7 10*3/uL — ABNORMAL HIGH (ref 1.7–7.7)
Neutro Abs: 7
Neutrophils Relative %: 77 % (ref 43–77)
Neutrophils Relative %: 85 % — ABNORMAL HIGH (ref 43–77)
Neutrophils Relative %: 69

## 2011-04-03 LAB — POCT CARDIAC MARKERS
CKMB, poc: 1.1 ng/mL (ref 1.0–8.0)
CKMB, poc: <1 ng/mL — ABNORMAL LOW (ref 1.0–8.0)
CKMB, poc: <1 ng/mL — ABNORMAL LOW (ref 1.0–8.0)
Myoglobin, poc: 70.6 ng/mL (ref 12–200)
Myoglobin, poc: 86 ng/mL (ref 12–200)
Myoglobin, poc: 89.8 ng/mL (ref 12–200)
Troponin i, poc: <0.05 ng/mL (ref 0.00–0.09)
Troponin i, poc: <0.05 ng/mL (ref 0.00–0.09)
Troponin i, poc: <0.05 ng/mL (ref 0.00–0.09)

## 2011-04-03 LAB — URINE MICROSCOPIC-ADD ON

## 2011-04-03 LAB — STREP A DNA PROBE: Group A Strep Probe: NEGATIVE

## 2011-04-03 LAB — CK TOTAL AND CKMB (NOT AT ARMC)
CK, MB: 1.9 ng/mL (ref 0.3–4.0)
Relative Index: INVALID (ref 0.0–2.5)
Total CK: 53 U/L (ref 7–232)

## 2011-04-03 LAB — TROPONIN I: Troponin I: 0.02 ng/mL (ref 0.00–0.06)

## 2011-04-03 LAB — RAPID STREP SCREEN (MED CTR MEBANE ONLY): Streptococcus, Group A Screen (Direct): NEGATIVE

## 2011-04-03 LAB — D-DIMER, QUANTITATIVE: D-Dimer, Quant: 0.67 ug/mL-FEU — ABNORMAL HIGH (ref 0.00–0.48)

## 2011-06-10 ENCOUNTER — Encounter: Payer: Self-pay | Admitting: Physician Assistant

## 2012-02-08 ENCOUNTER — Ambulatory Visit: Payer: Medicare Other | Attending: Family Medicine | Admitting: Physical Therapy

## 2012-02-08 DIAGNOSIS — M545 Low back pain, unspecified: Secondary | ICD-10-CM | POA: Insufficient documentation

## 2012-02-08 DIAGNOSIS — R5381 Other malaise: Secondary | ICD-10-CM | POA: Insufficient documentation

## 2012-02-08 DIAGNOSIS — IMO0001 Reserved for inherently not codable concepts without codable children: Secondary | ICD-10-CM | POA: Insufficient documentation

## 2012-02-12 ENCOUNTER — Ambulatory Visit: Payer: Medicare Other | Admitting: Physical Therapy

## 2012-02-16 ENCOUNTER — Ambulatory Visit: Payer: Medicare Other | Admitting: *Deleted

## 2012-02-18 ENCOUNTER — Encounter: Payer: Medicare Other | Admitting: *Deleted

## 2012-02-19 ENCOUNTER — Ambulatory Visit: Payer: Medicare Other | Admitting: *Deleted

## 2012-02-23 ENCOUNTER — Encounter: Payer: Medicare Other | Admitting: Physical Therapy

## 2012-02-26 ENCOUNTER — Ambulatory Visit: Payer: Medicare Other | Admitting: *Deleted

## 2012-03-02 ENCOUNTER — Ambulatory Visit: Payer: Medicare Other | Attending: Family Medicine | Admitting: Physical Therapy

## 2012-03-02 ENCOUNTER — Ambulatory Visit (HOSPITAL_COMMUNITY): Admit: 2012-03-02 | Payer: Medicare Other

## 2012-03-02 DIAGNOSIS — M545 Low back pain, unspecified: Secondary | ICD-10-CM | POA: Insufficient documentation

## 2012-03-02 DIAGNOSIS — R5381 Other malaise: Secondary | ICD-10-CM | POA: Insufficient documentation

## 2012-03-02 DIAGNOSIS — IMO0001 Reserved for inherently not codable concepts without codable children: Secondary | ICD-10-CM | POA: Insufficient documentation

## 2012-03-08 ENCOUNTER — Ambulatory Visit: Payer: Medicare Other | Admitting: Physical Therapy

## 2012-03-10 ENCOUNTER — Ambulatory Visit: Payer: Medicare Other | Admitting: Physical Therapy

## 2012-03-16 ENCOUNTER — Ambulatory Visit: Payer: Medicare Other | Admitting: *Deleted

## 2012-03-17 NOTE — Progress Notes (Unsigned)
Patient ID: [name], male   DOB: Jan 24, 1966, 46 y.o.   MRN: [id number]

## 2012-03-18 ENCOUNTER — Ambulatory Visit: Payer: Medicare Other | Admitting: *Deleted

## 2012-03-21 ENCOUNTER — Encounter: Payer: Medicare Other | Admitting: *Deleted

## 2012-03-25 ENCOUNTER — Ambulatory Visit: Payer: Medicare Other | Admitting: *Deleted

## 2012-03-29 ENCOUNTER — Other Ambulatory Visit (HOSPITAL_COMMUNITY): Payer: Self-pay | Admitting: Oral Surgery

## 2012-03-30 ENCOUNTER — Ambulatory Visit: Payer: Medicare Other | Attending: Family Medicine | Admitting: *Deleted

## 2012-03-30 DIAGNOSIS — M545 Low back pain, unspecified: Secondary | ICD-10-CM | POA: Insufficient documentation

## 2012-03-30 DIAGNOSIS — R5381 Other malaise: Secondary | ICD-10-CM | POA: Insufficient documentation

## 2012-03-30 DIAGNOSIS — IMO0001 Reserved for inherently not codable concepts without codable children: Secondary | ICD-10-CM | POA: Insufficient documentation

## 2012-04-01 ENCOUNTER — Ambulatory Visit: Payer: Medicare Other | Admitting: *Deleted

## 2012-04-04 ENCOUNTER — Encounter: Payer: Medicare Other | Admitting: *Deleted

## 2012-04-08 ENCOUNTER — Ambulatory Visit: Payer: Medicare Other | Admitting: *Deleted

## 2012-04-11 ENCOUNTER — Ambulatory Visit: Payer: Medicare Other | Admitting: *Deleted

## 2012-04-13 ENCOUNTER — Ambulatory Visit: Payer: Medicare Other | Admitting: *Deleted

## 2012-04-18 ENCOUNTER — Ambulatory Visit: Payer: Medicare Other | Admitting: *Deleted

## 2012-04-22 ENCOUNTER — Ambulatory Visit: Payer: Medicare Other | Admitting: *Deleted

## 2012-04-26 ENCOUNTER — Other Ambulatory Visit: Payer: Self-pay | Admitting: Family Medicine

## 2012-04-26 ENCOUNTER — Encounter (HOSPITAL_COMMUNITY)
Admit: 2012-04-26 | Discharge: 2012-04-26 | Disposition: A | Discharge status: Home / Self Care | Payer: Medicare Other | Attending: Oral Surgery | Admitting: Oral Surgery

## 2012-04-26 ENCOUNTER — Encounter (HOSPITAL_COMMUNITY): Admit: 2012-04-26 | Payer: Medicare Other

## 2012-04-26 ENCOUNTER — Ambulatory Visit (HOSPITAL_COMMUNITY)
Admission: RE | Admit: 2012-04-26 | Discharge: 2012-04-26 | Disposition: A | Discharge status: Home / Self Care | Payer: Medicare Other | Source: Ambulatory Visit | Attending: Family Medicine | Admitting: Family Medicine

## 2012-04-26 ENCOUNTER — Encounter (HOSPITAL_COMMUNITY): Payer: Self-pay

## 2012-04-26 ENCOUNTER — Ambulatory Visit (HOSPITAL_COMMUNITY)
Admit: 2012-04-26 | Discharge: 2012-04-26 | Disposition: A | Discharge status: Home / Self Care | Payer: Medicare Other | Attending: Oral Surgery | Admitting: Oral Surgery

## 2012-04-26 ENCOUNTER — Inpatient Hospital Stay (HOSPITAL_COMMUNITY)
Admission: RE | Admit: 2012-04-26 | Discharge: 2012-05-10 | DRG: 137 | Disposition: A | Discharge status: Home / Self Care | Payer: Medicare Other | Source: Ambulatory Visit | Attending: Family Medicine | Admitting: Family Medicine

## 2012-04-26 ENCOUNTER — Encounter (HOSPITAL_COMMUNITY): Payer: Self-pay | Admitting: Pharmacy Technician

## 2012-04-26 DIAGNOSIS — R52 Pain, unspecified: Secondary | ICD-10-CM

## 2012-04-26 DIAGNOSIS — E669 Obesity, unspecified: Secondary | ICD-10-CM

## 2012-04-26 DIAGNOSIS — R5381 Other malaise: Secondary | ICD-10-CM | POA: Diagnosis present

## 2012-04-26 DIAGNOSIS — N189 Chronic kidney disease, unspecified: Secondary | ICD-10-CM | POA: Diagnosis present

## 2012-04-26 DIAGNOSIS — Z01818 Encounter for other preprocedural examination: Secondary | ICD-10-CM

## 2012-04-26 DIAGNOSIS — Z86718 Personal history of other venous thrombosis and embolism: Secondary | ICD-10-CM

## 2012-04-26 DIAGNOSIS — G8929 Other chronic pain: Secondary | ICD-10-CM | POA: Diagnosis present

## 2012-04-26 DIAGNOSIS — K029 Dental caries, unspecified: Principal | ICD-10-CM | POA: Diagnosis present

## 2012-04-26 DIAGNOSIS — F319 Bipolar disorder, unspecified: Secondary | ICD-10-CM | POA: Diagnosis present

## 2012-04-26 DIAGNOSIS — K219 Gastro-esophageal reflux disease without esophagitis: Secondary | ICD-10-CM | POA: Diagnosis present

## 2012-04-26 DIAGNOSIS — Z01810 Encounter for preprocedural cardiovascular examination: Secondary | ICD-10-CM

## 2012-04-26 DIAGNOSIS — E1149 Type 2 diabetes mellitus with other diabetic neurological complication: Secondary | ICD-10-CM | POA: Diagnosis present

## 2012-04-26 DIAGNOSIS — Z79899 Other long term (current) drug therapy: Secondary | ICD-10-CM

## 2012-04-26 DIAGNOSIS — G43909 Migraine, unspecified, not intractable, without status migrainosus: Secondary | ICD-10-CM | POA: Diagnosis present

## 2012-04-26 DIAGNOSIS — E1142 Type 2 diabetes mellitus with diabetic polyneuropathy: Secondary | ICD-10-CM | POA: Diagnosis present

## 2012-04-26 DIAGNOSIS — R0602 Shortness of breath: Secondary | ICD-10-CM

## 2012-04-26 DIAGNOSIS — Z7901 Long term (current) use of anticoagulants: Secondary | ICD-10-CM

## 2012-04-26 DIAGNOSIS — Z6841 Body Mass Index (BMI) 40.0 and over, adult: Secondary | ICD-10-CM

## 2012-04-26 DIAGNOSIS — Z86711 Personal history of pulmonary embolism: Secondary | ICD-10-CM

## 2012-04-26 DIAGNOSIS — G4733 Obstructive sleep apnea (adult) (pediatric): Secondary | ICD-10-CM | POA: Diagnosis present

## 2012-04-26 DIAGNOSIS — F411 Generalized anxiety disorder: Secondary | ICD-10-CM | POA: Diagnosis present

## 2012-04-26 DIAGNOSIS — Z01812 Encounter for preprocedural laboratory examination: Secondary | ICD-10-CM

## 2012-04-26 HISTORY — DX: Bronchitis, not specified as acute or chronic: J40

## 2012-04-26 HISTORY — DX: Type 2 diabetes mellitus without complications: E11.9

## 2012-04-26 HISTORY — DX: Spontaneous ecchymoses: R23.3

## 2012-04-26 HISTORY — DX: Myoneural disorder, unspecified: G70.9

## 2012-04-26 HISTORY — DX: Type 2 diabetes mellitus with diabetic neuropathy, unspecified: E11.40

## 2012-04-26 HISTORY — DX: Bipolar disorder, unspecified: F31.9

## 2012-04-26 HISTORY — DX: Unspecified osteoarthritis, unspecified site: M19.90

## 2012-04-26 HISTORY — DX: Other skin changes: R23.8

## 2012-04-26 HISTORY — DX: Mental disorder, not otherwise specified: F99

## 2012-04-26 HISTORY — DX: Chronic kidney disease, unspecified: N18.9

## 2012-04-26 HISTORY — DX: Anxiety disorder, unspecified: F41.9

## 2012-04-26 HISTORY — DX: Unspecified convulsions: R56.9

## 2012-04-26 HISTORY — DX: Migraine, unspecified, not intractable, without status migrainosus: G43.909

## 2012-04-26 HISTORY — DX: Anemia, unspecified: D64.9

## 2012-04-26 HISTORY — DX: Gastro-esophageal reflux disease without esophagitis: K21.9

## 2012-04-26 LAB — BASIC METABOLIC PANEL
BUN: 39 mg/dL — ABNORMAL HIGH (ref 6–23)
CO2: 29 mEq/L (ref 19–32)
Calcium: 8.5 mg/dL (ref 8.4–10.5)
Chloride: 100 mEq/L (ref 96–112)
Creatinine, Ser: 1.81 mg/dL — ABNORMAL HIGH (ref 0.50–1.35)
GFR calc Af Amer: 50 mL/min — ABNORMAL LOW (ref >90)
GFR calc non Af Amer: 44 mL/min — ABNORMAL LOW (ref >90)
Glucose, Bld: 194 mg/dL — ABNORMAL HIGH (ref 70–99)
Potassium: 4 mEq/L (ref 3.5–5.1)
Sodium: 135 mEq/L (ref 135–145)

## 2012-04-26 LAB — PROTIME-INR
INR: 2.34 — ABNORMAL HIGH (ref 0.00–1.49)
Prothrombin Time: 24.6 seconds — ABNORMAL HIGH (ref 11.6–15.2)

## 2012-04-26 LAB — APTT: aPTT: 45 seconds — ABNORMAL HIGH (ref 24–37)

## 2012-04-26 LAB — CBC
HCT: 33.9 % — ABNORMAL LOW (ref 39.0–52.0)
Hemoglobin: 10.1 g/dL — ABNORMAL LOW (ref 13.0–17.0)
MCH: 24.3 pg — ABNORMAL LOW (ref 26.0–34.0)
MCHC: 29.8 g/dL — ABNORMAL LOW (ref 30.0–36.0)
MCV: 81.5 fL (ref 78.0–100.0)
Platelets: 140 10*3/uL — ABNORMAL LOW (ref 150–400)
RBC: 4.16 MIL/uL — ABNORMAL LOW (ref 4.22–5.81)
RDW: 18.3 % — ABNORMAL HIGH (ref 11.5–15.5)
WBC: 5.9 10*3/uL (ref 4.0–10.5)

## 2012-04-26 NOTE — Progress Notes (Signed)
Patient informed Nurse that he had a stress test, followed by a clean cardiac cath, no PCI. Cardiologist is Dr. Lattie Haw. Patient also had sleep apnea and is supposed to wear a CPAP machine but patient stated "I sleep sitting upward with 2 to 3 pillows so I don't wear my machine." PCP is Dr. Redge Gainer at [place] [phone] but patient usually sees PA Worthy Keeler. Will request LOV.

## 2012-04-27 ENCOUNTER — Ambulatory Visit: Payer: Medicare Other | Admitting: *Deleted

## 2012-04-27 NOTE — Consult Note (Addendum)
Anesthesia Chart Review:  Patient is a 46 year old male scheduled for multiple teeth extractions on 05/09/12 by Dr. Hoyt Koch.  History includes non-smoker, OSA with CPAP use, morbid obesity, Bipolar 1 disorder, seizures, anxiety, anemia, arthritis, DM2, GERD, CKD, migraines, normal coronaries by cath 07/2008 (by Dr. Izell [place] 08/30/08 note).  He is also on chronic anticoagulation therapy for history of PE ~ '11 treated with Lovenox, but developed a LLE DVT in early '12 and was started on Coumadin.  PCP is Dr. Redge Gainer at [facility]).    Dobutamine stress echo on 07/23/08 showed: IMPRESSION: Negative pharmacologic stress echocardiogram revealing a somewhat submaximal heart rate despite the administration of 1 mg of atropine, severe chest discomfort during drug administration without a electrocardiographic nor echocardiographic verification of myocardial ischemia; EKG evidence for inferior infarction without any apparent wall motion abnormality on echocardiography. Overall, this represents a very low risk study.   According to Dr. Izell [place] 08/30/08 note, "The cardiac catheterization was done on August 02, 2008, and this demonstrated normal coronary arteries. He had an echocardiogram prior to that which demonstrated an EF of 60%, mild-to-moderate LVH, RV was mildly dilated with estimated peak RV systolic pressure mildly increased. The patient had a right radial approach for his heart catheterization."  PRN Cardiology follow-up was recommended.  Echo don eon 01/28/09 showed: 1. Left ventricle: The cavity size was normal. Systolic function was normal. The estimated ejection fraction was in the range of 55% to 65%. Wall motion was normal; there were no regional wall motion abnormalities. 2. Left atrium: The atrium was mildly dilated. 3. Right atrium: The atrium was mildly dilated.  EKG on 04/26/12 showed NSR.  CXR report on 04/26/12 showed: Study is suboptimal due to patient body habitus.  Low lung volumes. No acute infiltrate or pulmonary edema. Mild basilar atelectasis. Mild degenerative changes thoracic spine.  Labs noted.  BUN/Cr 39/1.81.  Glucose is 194.  H/H 10.1/33.9.  PLT 140.  PT/PTT elevated.  Received comparison labs from [facility] done on 04/17/12 that showed his BUN/Cr were 36/1.62 (with previous Cr 1.83).  Overall, his renal function appears stable when compared to the labs I received from his PCP.  He is on Coumadin.  He will need a repeat PT/PTT on arrival.  I spoke with Mr. Misiaszek this morning.  He says no one has instructed him on when/if to stop his Coumadin.  I called and spoke with Claiborne Billings at Dr. Lupita Leash office about PT/PTT results and having them contact patient's PCP and patient re: perioperative anticoagulation instructions, which they will do.  Reportedly, patient is being seen by Dr. Hoyt Koch next week for his pre-operative evaluation and history and physical.      Myra Gianotti, PA-C 04/28/12 1000

## 2012-04-28 ENCOUNTER — Encounter (HOSPITAL_COMMUNITY): Payer: Self-pay | Admitting: Vascular Surgery

## 2012-04-29 ENCOUNTER — Encounter: Payer: Medicare Other | Admitting: *Deleted

## 2012-05-02 ENCOUNTER — Ambulatory Visit: Payer: Medicare Other | Attending: Family Medicine | Admitting: *Deleted

## 2012-05-02 DIAGNOSIS — M545 Low back pain, unspecified: Secondary | ICD-10-CM | POA: Insufficient documentation

## 2012-05-02 DIAGNOSIS — IMO0001 Reserved for inherently not codable concepts without codable children: Secondary | ICD-10-CM | POA: Insufficient documentation

## 2012-05-02 DIAGNOSIS — R5381 Other malaise: Secondary | ICD-10-CM | POA: Insufficient documentation

## 2012-05-03 NOTE — H&P (Signed)
HISTORY AND PHYSICAL  [name] is a 46 y.o. male patient with CC: painful teeth  No diagnosis found.  Past Medical History  Diagnosis Date  . Sleep apnea     CPAP  . Seizures   . Migraine   . Anxiety   . Mental disorder   . Bipolar 1 disorder   . Bronchitis     hx of  . Dyspnea     with ambulation  . Diabetes mellitus without complication   . Neuromuscular disorder   . Diabetic neuropathy   . Arthritis   . GERD (gastroesophageal reflux disease)   . Anemia   . Bruises easily   . Chronic kidney disease     decreased left kidney fx  . DVT (deep venous thrombosis)     LLE DVT ~ '12  . PE (pulmonary embolism)     bilateral PE ~ '11    No current facility-administered medications for this encounter.   Current Outpatient Prescriptions  Medication Sig Dispense Refill  . busPIRone (BUSPAR) 15 MG tablet Take 15 mg by mouth 2 (two) times daily.      . Canagliflozin (INVOKANA) 100 MG TABS Take 100 mg by mouth daily.      . citalopram (CELEXA) 40 MG tablet Take 60 mg by mouth at bedtime.      . cyclobenzaprine (FLEXERIL) 5 MG tablet Take 5 mg by mouth 2 (two) times daily as needed. For muscle spasms      . docusate sodium (COLACE) 100 MG capsule Take 100 mg by mouth 2 (two) times daily.      . Fe Fum-FA-B Cmp-C-Zn-Mg-Mn-Cu (HEMOCYTE PLUS PO) Take 1 tablet by mouth daily.      . fentaNYL (DURAGESIC - DOSED MCG/HR) 75 MCG/HR Place 1 patch onto the skin every 3 (three) days.      . fluticasone (FLONASE) 50 MCG/ACT nasal spray Place 1 spray into the nose 2 (two) times daily.      . furosemide (LASIX) 40 MG tablet Take 60 mg by mouth daily.      . hydroxypropyl methylcellulose (ISOPTO TEARS) 2.5 % ophthalmic solution Place 1-2 drops into both eyes 2 (two) times daily.      . insulin aspart (NOVOLOG) 100 UNIT/ML injection Inject 2-12 Units into the skin 4 (four) times daily -  before meals and at bedtime.      . insulin glargine (LANTUS) 100 UNIT/ML injection Inject 110 Units  into the skin at bedtime.      . lamoTRIgine (LAMICTAL) 200 MG tablet Take 200 mg by mouth 2 (two) times daily.      Marland Kitchen levETIRAcetam (KEPPRA) 500 MG tablet Take 500 mg by mouth 2 (two) times daily.      . Multiple Vitamin (MULTIVITAMIN WITH MINERALS) TABS Take 1 tablet by mouth daily.      Marland Kitchen omeprazole (PRILOSEC) 20 MG capsule Take 20 mg by mouth 2 (two) times daily.      . potassium chloride (K-DUR) 10 MEQ tablet Take 30 mEq by mouth daily.      . pregabalin (LYRICA) 50 MG capsule Take 50 mg by mouth 3 (three) times daily.      . simvastatin (ZOCOR) 20 MG tablet Take 20 mg by mouth at bedtime.      . Tamsulosin HCl (FLOMAX) 0.4 MG CAPS Take 0.4 mg by mouth at bedtime.      Marland Kitchen warfarin (COUMADIN) 5 MG tablet Take 7.5-10 mg by mouth daily. Take 1.5 tab every  day except thursdays take 2 tabs      . zolpidem (AMBIEN) 5 MG tablet Take 2.5 mg by mouth at bedtime as needed. For sleep       Allergies  Allergen Reactions  . Bee Venom Shortness Of Breath and Swelling  . Penicillins Shortness Of Breath  . Iohexol      Code: RASH, Desc: PT WAS ON PREDNISONE FOR GOUT TX. @ TIME OF SCAN AND RECEIVED 50 MG OF BENADRYL IV-ARS 10/08/07   . Shellfish Allergy Nausea And Vomiting and Other (See Comments)    Feels like insides are twisting  . Latex Rash   Active Problems:  * No active hospital problems. *   Vitals: There were no vitals taken for this visit. Lab results:No results found for this or any previous visit (from the past 13 hour(s)). Radiology Results: No results found. General appearance: alert, cooperative and morbidly obese Head: Normocephalic, without obvious abnormality, atraumatic Eyes: negative Ears: normal TM's and external ear canals both ears Nose: Nares normal. Septum midline. Mucosa normal. No drainage or sinus tenderness. Throat: Edentulous maxilla. Gross dental caries teeth #'s 18, 19, 20, 21, 22, 23, 24, 25, 26, 27, 28, 29, 30, 31, and 32. Neck: no adenopathy and supple,  symmetrical, trachea midline Resp: clear to auscultation bilaterally Cardio: regular rate and rhythm, S1, S2 normal, no murmur, click, rub or gallop GI: soft, non-tender; bowel sounds normal; no masses,  no organomegaly  Assessment:46 WM Morbid Obesity, OSA, COPD, Bipolar, DVT, GERD, Chronic pain, DM with nonrestorable  teeth #'s 18, 19, 20, 21, 22, 23, 24, 25, 26, 27, 28, 29, 30, 31, and 32  Plan: Dental extractions  teeth #'s 18, 19, 20, 21, 22, 23, 24, 25, 26, 27, 28, 29, 30, 31, and 32. Alveoloplasty mandible. General anesthesia.   [name] 05/03/2012

## 2012-05-06 ENCOUNTER — Encounter: Payer: Medicare Other | Admitting: *Deleted

## 2012-05-09 ENCOUNTER — Encounter (HOSPITAL_COMMUNITY): Payer: Self-pay | Admitting: Vascular Surgery

## 2012-05-09 ENCOUNTER — Encounter (HOSPITAL_COMMUNITY)
Admission: RE | Disposition: A | Discharge status: Home / Self Care | Payer: Self-pay | Source: Ambulatory Visit | Attending: Oral Surgery

## 2012-05-09 ENCOUNTER — Encounter (HOSPITAL_COMMUNITY): Payer: Self-pay | Admitting: *Deleted

## 2012-05-09 HISTORY — PX: MULTIPLE EXTRACTIONS WITH ALVEOLOPLASTY: SHX5342

## 2012-05-09 LAB — GLUCOSE, CAPILLARY
Glucose-Capillary: 163 mg/dL — ABNORMAL HIGH (ref 70–99)
Glucose-Capillary: 184 mg/dL — ABNORMAL HIGH (ref 70–99)

## 2012-05-09 LAB — APTT: aPTT: 40 seconds — ABNORMAL HIGH (ref 24–37)

## 2012-05-09 LAB — SURGICAL PCR SCREEN
MRSA, PCR: NEGATIVE
Staphylococcus aureus: NEGATIVE

## 2012-05-09 LAB — PROTIME-INR
INR: 1.54 — ABNORMAL HIGH (ref 0.00–1.49)
Prothrombin Time: 18 seconds — ABNORMAL HIGH (ref 11.6–15.2)

## 2012-05-09 SURGERY — MULTIPLE EXTRACTION WITH ALVEOLOPLASTY
Anesthesia: General | Site: Mouth | Laterality: Bilateral | Wound class: Contaminated

## 2012-05-09 MED ORDER — DIPHENHYDRAMINE HCL 50 MG/ML IJ SOLN: 12.5000 mg | Freq: Four times a day (QID) | INTRAMUSCULAR | Status: DC | PRN

## 2012-05-09 MED ORDER — LIDOCAINE-EPINEPHRINE 2 %-1:100000 IJ SOLN
INTRAMUSCULAR | Status: AC
  Filled 2012-05-09: qty 1

## 2012-05-09 MED ORDER — FENTANYL CITRATE 0.05 MG/ML IJ SOLN
INTRAMUSCULAR | Status: DC | PRN
  Administered 2012-05-09: 100 ug via INTRAVENOUS
  Administered 2012-05-09: 50 ug via INTRAVENOUS

## 2012-05-09 MED ORDER — ALUM & MAG HYDROXIDE-SIMETH 200-200-20 MG/5ML PO SUSP
30.0000 mL | ORAL | Status: DC | PRN
  Administered 2012-05-09: 30 mL via ORAL
  Filled 2012-05-09: qty 30

## 2012-05-09 MED ORDER — SIMVASTATIN 20 MG PO TABS
20.0000 mg | ORAL_TABLET | Freq: Every day | ORAL | Status: DC
  Administered 2012-05-09: 20 mg via ORAL
  Filled 2012-05-09 (×2): qty 1

## 2012-05-09 MED ORDER — ONDANSETRON HCL 4 MG/2ML IJ SOLN
INTRAMUSCULAR | Status: DC | PRN
  Administered 2012-05-09: 4 mg via INTRAVENOUS

## 2012-05-09 MED ORDER — HYDROMORPHONE HCL PF 1 MG/ML IJ SOLN
INTRAMUSCULAR | Status: AC
  Filled 2012-05-09: qty 1

## 2012-05-09 MED ORDER — ROCURONIUM BROMIDE 100 MG/10ML IV SOLN
INTRAVENOUS | Status: DC | PRN
  Administered 2012-05-09: 30 mg via INTRAVENOUS

## 2012-05-09 MED ORDER — DIPHENHYDRAMINE HCL 12.5 MG/5ML PO ELIX
12.5000 mg | ORAL_SOLUTION | Freq: Four times a day (QID) | ORAL | Status: DC | PRN
  Filled 2012-05-09: qty 10

## 2012-05-09 MED ORDER — OXYCODONE HCL 5 MG PO TABS: 5.0000 mg | ORAL_TABLET | Freq: Once | ORAL | Status: DC | PRN

## 2012-05-09 MED ORDER — FUROSEMIDE 40 MG PO TABS
60.0000 mg | ORAL_TABLET | Freq: Every day | ORAL | Status: DC
  Administered 2012-05-09 – 2012-05-10 (×2): 60 mg via ORAL
  Filled 2012-05-09 (×2): qty 1

## 2012-05-09 MED ORDER — OXYCODONE HCL 5 MG PO TABS
10.0000 mg | ORAL_TABLET | ORAL | Status: DC | PRN
  Administered 2012-05-09: 5 mg via ORAL
  Administered 2012-05-09 – 2012-05-10 (×2): 10 mg via ORAL
  Filled 2012-05-09: qty 1
  Filled 2012-05-09 (×2): qty 2

## 2012-05-09 MED ORDER — ALBUMIN HUMAN 5 % IV SOLN
INTRAVENOUS | Status: AC
  Filled 2012-05-09: qty 250

## 2012-05-09 MED ORDER — DEXTROSE-NACL 5-0.45 % IV SOLN
INTRAVENOUS | Status: DC
  Administered 2012-05-09: 14:00:00 via INTRAVENOUS

## 2012-05-09 MED ORDER — PANTOPRAZOLE SODIUM 40 MG PO TBEC
80.0000 mg | DELAYED_RELEASE_TABLET | Freq: Every day | ORAL | Status: DC
  Administered 2012-05-09 – 2012-05-10 (×2): 80 mg via ORAL
  Filled 2012-05-09: qty 2

## 2012-05-09 MED ORDER — CLINDAMYCIN PHOSPHATE 600 MG/50ML IV SOLN
INTRAVENOUS | Status: DC | PRN
  Administered 2012-05-09: 600 mg via INTRAVENOUS

## 2012-05-09 MED ORDER — DOCUSATE SODIUM 100 MG PO CAPS
100.0000 mg | ORAL_CAPSULE | Freq: Two times a day (BID) | ORAL | Status: DC
  Administered 2012-05-09 – 2012-05-10 (×2): 100 mg via ORAL
  Filled 2012-05-09: qty 1

## 2012-05-09 MED ORDER — NEOSTIGMINE METHYLSULFATE 1 MG/ML IJ SOLN
INTRAMUSCULAR | Status: DC | PRN
  Administered 2012-05-09: 5 mg via INTRAVENOUS

## 2012-05-09 MED ORDER — ACETAMINOPHEN 325 MG PO TABS: 650.0000 mg | ORAL_TABLET | Freq: Four times a day (QID) | ORAL | Status: DC | PRN

## 2012-05-09 MED ORDER — INSULIN ASPART 100 UNIT/ML ~~LOC~~ SOLN
0.0000 [IU] | Freq: Three times a day (TID) | SUBCUTANEOUS | Status: DC
  Administered 2012-05-10: 11 [IU] via SUBCUTANEOUS

## 2012-05-09 MED ORDER — PROMETHAZINE HCL 25 MG/ML IJ SOLN: 6.2500 mg | INTRAMUSCULAR | Status: DC | PRN

## 2012-05-09 MED ORDER — SUCCINYLCHOLINE CHLORIDE 20 MG/ML IJ SOLN
INTRAMUSCULAR | Status: DC | PRN
  Administered 2012-05-09: 100 mg via INTRAVENOUS

## 2012-05-09 MED ORDER — POTASSIUM CHLORIDE ER 10 MEQ PO TBCR
30.0000 meq | EXTENDED_RELEASE_TABLET | Freq: Every day | ORAL | Status: DC
  Administered 2012-05-09 – 2012-05-10 (×2): 30 meq via ORAL
  Filled 2012-05-09 (×2): qty 3

## 2012-05-09 MED ORDER — PROPOFOL 10 MG/ML IV BOLUS
INTRAVENOUS | Status: DC | PRN
  Administered 2012-05-09: 200 mg via INTRAVENOUS

## 2012-05-09 MED ORDER — FENTANYL 50 MCG/HR TD PT72
75.0000 ug | MEDICATED_PATCH | TRANSDERMAL | Status: DC
  Administered 2012-05-09: 75 ug via TRANSDERMAL
  Filled 2012-05-09: qty 1

## 2012-05-09 MED ORDER — TAMSULOSIN HCL 0.4 MG PO CAPS
0.4000 mg | ORAL_CAPSULE | Freq: Every day | ORAL | Status: DC
  Administered 2012-05-09: 0.4 mg via ORAL
  Filled 2012-05-09 (×2): qty 1

## 2012-05-09 MED ORDER — LAMOTRIGINE 200 MG PO TABS
200.0000 mg | ORAL_TABLET | Freq: Two times a day (BID) | ORAL | Status: DC
  Administered 2012-05-09 – 2012-05-10 (×2): 200 mg via ORAL
  Filled 2012-05-09 (×3): qty 1

## 2012-05-09 MED ORDER — HYDROMORPHONE HCL PF 1 MG/ML IJ SOLN
0.2500 mg | INTRAMUSCULAR | Status: DC | PRN
  Administered 2012-05-09 (×2): 0.5 mg via INTRAVENOUS

## 2012-05-09 MED ORDER — LEVETIRACETAM 500 MG PO TABS
500.0000 mg | ORAL_TABLET | Freq: Two times a day (BID) | ORAL | Status: DC
  Administered 2012-05-09 – 2012-05-10 (×2): 500 mg via ORAL
  Filled 2012-05-09 (×3): qty 1

## 2012-05-09 MED ORDER — OXYCODONE-ACETAMINOPHEN 5-325 MG PO TABS: 1.0000 | ORAL_TABLET | ORAL | Status: DC | PRN

## 2012-05-09 MED ORDER — ONDANSETRON HCL 4 MG/2ML IJ SOLN: 4.0000 mg | Freq: Four times a day (QID) | INTRAMUSCULAR | Status: DC | PRN

## 2012-05-09 MED ORDER — OXYMETAZOLINE HCL 0.05 % NA SOLN
NASAL | Status: AC
  Filled 2012-05-09: qty 15

## 2012-05-09 MED ORDER — ACETAMINOPHEN 650 MG RE SUPP: 650.0000 mg | Freq: Four times a day (QID) | RECTAL | Status: DC | PRN

## 2012-05-09 MED ORDER — CLINDAMYCIN HCL 300 MG PO CAPS: 300.0000 mg | ORAL_CAPSULE | Freq: Three times a day (TID) | ORAL | Status: DC

## 2012-05-09 MED ORDER — SODIUM CHLORIDE 0.9 % IR SOLN
Status: DC | PRN
  Administered 2012-05-09: 1000 mL

## 2012-05-09 MED ORDER — LIDOCAINE-EPINEPHRINE 2 %-1:100000 IJ SOLN
INTRAMUSCULAR | Status: DC | PRN
  Administered 2012-05-09: 30 mL via INTRADERMAL

## 2012-05-09 MED ORDER — PREGABALIN 50 MG PO CAPS
50.0000 mg | ORAL_CAPSULE | Freq: Three times a day (TID) | ORAL | Status: DC
Start: 2012-05-09 — End: 2012-05-10
  Administered 2012-05-09 – 2012-05-10 (×2): 50 mg via ORAL
  Filled 2012-05-09 (×2): qty 1

## 2012-05-09 MED ORDER — OXYCODONE HCL 5 MG/5ML PO SOLN: 5.0000 mg | Freq: Once | ORAL | Status: DC | PRN

## 2012-05-09 MED ORDER — GLYCOPYRROLATE 0.2 MG/ML IJ SOLN
INTRAMUSCULAR | Status: DC | PRN
  Administered 2012-05-09: .8 mg via INTRAVENOUS

## 2012-05-09 MED ORDER — INSULIN GLARGINE 100 UNIT/ML ~~LOC~~ SOLN: 110.0000 [IU] | Freq: Every day | SUBCUTANEOUS | Status: DC

## 2012-05-09 MED ORDER — OXYCODONE HCL 5 MG PO TABS
5.0000 mg | ORAL_TABLET | ORAL | Status: DC | PRN
  Administered 2012-05-09: 5 mg via ORAL
  Filled 2012-05-09: qty 1

## 2012-05-09 MED ORDER — CLINDAMYCIN PHOSPHATE 600 MG/50ML IV SOLN
INTRAVENOUS | Status: AC
  Filled 2012-05-09: qty 50

## 2012-05-09 MED ORDER — INSULIN ASPART 100 UNIT/ML ~~LOC~~ SOLN: 2.0000 [IU] | Freq: Three times a day (TID) | SUBCUTANEOUS | Status: DC

## 2012-05-09 MED ORDER — LIDOCAINE HCL (CARDIAC) 20 MG/ML IV SOLN
INTRAVENOUS | Status: DC | PRN
  Administered 2012-05-09: 100 mg via INTRAVENOUS

## 2012-05-09 MED ORDER — BUSPIRONE HCL 15 MG PO TABS
15.0000 mg | ORAL_TABLET | Freq: Two times a day (BID) | ORAL | Status: DC
  Administered 2012-05-09 – 2012-05-10 (×2): 15 mg via ORAL
  Filled 2012-05-09 (×3): qty 1

## 2012-05-09 MED ORDER — LACTATED RINGERS IV SOLN
INTRAVENOUS | Status: DC | PRN
  Administered 2012-05-09: 08:00:00 via INTRAVENOUS

## 2012-05-09 SURGICAL SUPPLY — 31 items
BUR CROSS CUT FISSURE 1.6 (BURR) ×2 IMPLANT
BUR EGG ELITE 4.0 (BURR) ×2 IMPLANT
CANISTER SUCTION 2500CC (MISCELLANEOUS) ×2 IMPLANT
CLOTH BEACON ORANGE TIMEOUT ST (SAFETY) ×2 IMPLANT
COVER SURGICAL LIGHT HANDLE (MISCELLANEOUS) ×2 IMPLANT
CRADLE DONUT ADULT HEAD (MISCELLANEOUS) ×2 IMPLANT
DECANTER SPIKE VIAL GLASS SM (MISCELLANEOUS) ×2 IMPLANT
GAUZE PACKING FOLDED 2  STR (GAUZE/BANDAGES/DRESSINGS) ×1
GAUZE PACKING FOLDED 2 STR (GAUZE/BANDAGES/DRESSINGS) ×1 IMPLANT
GLOVE BIO SURGEON STRL SZ 6.5 (GLOVE) IMPLANT
GLOVE BIO SURGEON STRL SZ7.5 (GLOVE) IMPLANT
GLOVE BIOGEL PI IND STRL 6.5 (GLOVE) ×1 IMPLANT
GLOVE BIOGEL PI IND STRL 7.0 (GLOVE) ×1 IMPLANT
GLOVE BIOGEL PI INDICATOR 6.5 (GLOVE) ×1
GLOVE BIOGEL PI INDICATOR 7.0 (GLOVE) ×1
GLOVE SS N UNI LF 6.5 STRL (GLOVE) ×2 IMPLANT
GLOVE SS N UNI LF 7.5 STRL (GLOVE) ×2 IMPLANT
GOWN STRL NON-REIN LRG LVL3 (GOWN DISPOSABLE) ×4 IMPLANT
GOWN STRL REIN XL XLG (GOWN DISPOSABLE) ×2 IMPLANT
KIT BASIN OR (CUSTOM PROCEDURE TRAY) ×2 IMPLANT
KIT ROOM TURNOVER OR (KITS) ×2 IMPLANT
NEEDLE 22X1 1/2 (OR ONLY) (NEEDLE) ×2 IMPLANT
NS IRRIG 1000ML POUR BTL (IV SOLUTION) ×2 IMPLANT
PAD ARMBOARD 7.5X6 YLW CONV (MISCELLANEOUS) ×4 IMPLANT
SUT CHROMIC 3 0 PS 2 (SUTURE) ×4 IMPLANT
SYR CONTROL 10ML LL (SYRINGE) ×2 IMPLANT
TOWEL OR 17X26 10 PK STRL BLUE (TOWEL DISPOSABLE) ×2 IMPLANT
TRAY ENT MC OR (CUSTOM PROCEDURE TRAY) ×2 IMPLANT
TUBING IRRIGATION (MISCELLANEOUS) ×2 IMPLANT
WATER STERILE IRR 1000ML POUR (IV SOLUTION) IMPLANT
YANKAUER SUCT BULB TIP NO VENT (SUCTIONS) ×2 IMPLANT

## 2012-05-09 NOTE — H&P (Signed)
H&P documentation  -History and Physical Reviewed  -Patient has been re-examined  -No change in the plan of care  [name] M  

## 2012-05-09 NOTE — Op Note (Signed)
05/09/2012  8:23 AM  PATIENT:  [name]  46 y.o. male  PRE-OPERATIVE DIAGNOSIS:  DENTAL CARIES, Nonrestorable teeth #'s 18, 19, 20, 21, 22, 23, 24, 25, 26, 27, 28, 29, 30, 31, 32  POST-OPERATIVE DIAGNOSIS:  SAME  PROCEDURE:  Procedure(s): MULTIPLE EXTRACION WITH ALVEOLOPLASTY teeth #'s 18, 19, 20, 21, 22, 23, 24, 25, 26, 27, 28, 29, 30, 31, 32  SURGEON:  Surgeon(s): Gae Bon, DDS  ANESTHESIA:   local and general  EBL:  minimal  DRAINS: none   SPECIMEN:  No Specimen  COUNTS:  YES  PLAN OF CARE: Discharge to home after PACU  PATIENT DISPOSITION:  PACU - hemodynamically stable.   PROCEDURE DETAILS: Dictation [id number] Gae Bon, DMD 05/09/2012 8:23 AM

## 2012-05-09 NOTE — Progress Notes (Signed)
ARRIVED TO UNIT 6N#9 FROM PACU IN BED. FAMILY AT BEDSIDE. ORIENTED TO ROOM AND SURROUNDINGS. DENIES NAUSEA, C/O MOUTH PAIN 5/10. NO BLEEDING NOTED

## 2012-05-09 NOTE — Progress Notes (Signed)
Dr Tobias Alexander aware pt de sats to the 60's and 70's. Pt will remain with low sats for 1 - 2 min, then come up to the 80's and 90's for 1 - 2 min, then drop back down.  He States pt is to be monitored longer in pacu.  Has concerns pt may need to stay over night, and will speak to Dr Hoyt Koch about this.

## 2012-05-09 NOTE — Progress Notes (Signed)
Patient was able to void 400 ml around 9 pm and states that he felt better. Declines having in and out cath. Lasix and Flomax given as scheduled. Will continue to monitor.

## 2012-05-09 NOTE — Anesthesia Postprocedure Evaluation (Signed)
Anesthesia Post Note  Patient: [name]  Procedure(s) Performed: Procedure(s) (LRB): MULTIPLE EXTRACION WITH ALVEOLOPLASTY (Bilateral)  Anesthesia type: general  Patient location: PACU  Post pain: Pain level controlled  Post assessment: Patient's Cardiovascular Status Stable  Last Vitals:  Filed Vitals:   05/09/12 1030  BP: 124/55  Pulse: 82  Temp:   Resp: 10    Post vital signs: Reviewed and stable, O2 dependent  Level of consciousness: sedated  Complications: No apparent anesthesia complications

## 2012-05-09 NOTE — Progress Notes (Signed)
Dr Hoyt Koch has seen pt in pacu and aware of o2 saturation.  He states pt need to be admitted 230hr obs.  He will write for those orders.

## 2012-05-09 NOTE — Op Note (Signed)
NAMEDEWELL, CAGLE             ACCOUNT NO.:  [id number]  MEDICAL RECORD NO.:  [id number]  LOCATION:  6N09C                        FACILITY:  [facility]  PHYSICIAN:  Gae Bon, M.D.  DATE OF BIRTH:  1966/04/07  DATE OF PROCEDURE:  05/09/2012 DATE OF DISCHARGE:                              OPERATIVE REPORT   PREOPERATIVE DIAGNOSIS:  Nonrestorable teeth numbers 18, 19, 20, 21, 22, 23, 24, 25, 26, 27, 28, 29, 30, 31, and 32.  POSTOPERATIVE DIAGNOSIS:  Nonrestorable teeth numbers 18, 19, 20, 21, 22, 23, 24, 25, 26, 27, 28, 29, 30, 31, and 32.  PROCEDURES:  Removal of teeth numbers 18, 19, 20, 21, 22, 23, 24, 25, 26, 27, 28, 29, 30, 31, 32; and alveoplasty of right and left mandible.  SURGEON:  Gae Bon, M.D.  ANESTHESIA:  General.  Dr. Tobias Alexander, attending.  INDICATIONS FOR PROCEDURE:  [name] is a 46 year old male who was self- referred to my office complaining of toothache.  He was examined and found to have edentulous maxilla and gross dental caries of the mandible.  He has significant medical problems of morbid obesity, obstructive sleep apnea, insulin-dependent diabetes, acute renal failure, chronic pain, chronic fatigue and psychiatric disorder. Because of the extensiveness of the procedure and the patient's medical condition, it was recommended that general anesthesia would be performed with intubation for airway protection.  PROCEDURE:  The patient was taken to the operating room, placed on the table in supine position.  General anesthesia was administered intravenously and an oral endotracheal tube was placed and marked.  The eyes were protected.  The patient was draped for the procedure.  Time- out was performed.  Then, the posterior pharynx was suctioned with the Yankauer suction, and a throat pack was placed.  A 2% lidocaine with 1:100,000 epinephrine was infiltrated in the inferior alveolar block on the right and left side and buccal infiltration of the  maxilla, a total of 12 mL was utilized.  Sweetheart retractor was used to retract the tongue and a bite block was placed in the right side of the mouth and the left side was operated first.  A #15 blade was used to make a full- thickness incision on the buccal and lingual aspects of teeth numbers 18, 19, 20, 21, 22, 23, 24, 25, and 26.  The periosteum was reflected with a periosteal elevator.  The teeth were elevated with a 301 dental elevator.  The posterior teeth were removed with the Cowhorn forceps. Tooth #18 fractured during removal.  The anterior teeth were removed with the Asch forceps.  Tooth #22 fractured during removal.  Then, the Stryker handpiece with the fissure bur was used to remove bone around the roots of teeth numbers 18 and 22.  The roots were then removed with a 301 elevator and a rongeur.  The sockets were then curetted of all the teeth on the left side and then the periosteum was reflected to expose the alveolar crest and then alveoplasty was performed with the egg- shaped bur and the bone file.  Then, the areas irrigated and closed with 3-0 chromic.  The sweetheart and bite block were repositioned to the other side of the mouth and  a 15-blade was used to make a full-thickness incision around teeth numbers 32, 31, 30, 29, 28, 27 on the buccal and lingual aspects.  The periosteum was reflected with a periosteal elevator.  The teeth were elevated with a 301 elevator and then the posterior teeth were removed using the Cowhorn forceps.  Tooth #31 fractured as did tooth #27.  The anterior teeth were removed with the Asch forceps.  Then, the circumferential bone was removed from around the residual roots of tooth numbers 31 and 27.  The teeth were elevated and removed with the rongeur.  Then, the sockets were curetted and the alveolar crest was exposed using the periosteal elevator.  The alveoplasty was performed with the egg-shaped bur and the bone file. Then, the area  was irrigated and closed with 3-0 chromic.  The oral cavity was then inspected and found to have good contour, and hemostasis and closure.  The oral cavity was irrigated and suctioned.  The throat pack was removed.  The patient was awakened, taken to the recovery room, breathing spontaneously in good condition.  EBL:  Minimum.  COMPLICATIONS:  None.  SPECIMENS:  None.     Gae Bon, M.D.     SMJ/MEDQ  D:  05/09/2012  T:  05/09/2012  Job:  [id number]

## 2012-05-09 NOTE — Anesthesia Procedure Notes (Signed)
Procedure Name: Intubation Date/Time: 05/09/2012 7:47 AM Performed by: Maryland Pink Pre-anesthesia Checklist: Patient identified, Timeout performed, Emergency Drugs available, Suction available and Patient being monitored Patient Re-evaluated:Patient Re-evaluated prior to inductionOxygen Delivery Method: Circle system utilized Preoxygenation: Pre-oxygenation with 100% oxygen Intubation Type: IV induction Ventilation: Mask ventilation without difficulty Laryngoscope Size: Mac and 4 Grade View: Grade I Tube type: Oral Tube size: 7.5 mm Number of attempts: 1 Airway Equipment and Method: Stylet Placement Confirmation: ETT inserted through vocal cords under direct vision,  positive ETCO2 and breath sounds checked- equal and bilateral Secured at: 23 cm Tube secured with: Tape Dental Injury: Teeth and Oropharynx as per pre-operative assessment

## 2012-05-09 NOTE — Progress Notes (Signed)
UNABLE TO PASS STRAIGHT FOLEY CATH BEYOND PROSTATE.

## 2012-05-09 NOTE — Transfer of Care (Signed)
Immediate Anesthesia Transfer of Care Note  Patient: [name]  Procedure(s) Performed: Procedure(s) (LRB) with comments: MULTIPLE EXTRACION WITH ALVEOLOPLASTY (Bilateral) - Extracted teeth numbers eighteen, nineteen, twenty, twenty-one, twenty- two, twenty-three, twenty-four, twenty-five, twenty-six, twenty-seven, twenty-eight, twenty-nine, thirty, thirty- one, thirty-two and alveoplasty lower right and left quadrants.   Patient Location: PACU  Anesthesia Type:General  Level of Consciousness: awake, alert  and oriented  Airway & Oxygen Therapy: Patient Spontanous Breathing and Patient connected to face mask oxygen  Post-op Assessment: Report given to PACU RN and Post -op Vital signs reviewed and stable  Post vital signs: Reviewed and stable  Complications: No apparent anesthesia complications

## 2012-05-09 NOTE — Progress Notes (Signed)
CALLED DR. Michele Mcalpine PER PT REQUEST FOR HOME MEDS. VERBAL ORDER FOR PT TO START ON HOME MEDS TONIGHT.

## 2012-05-09 NOTE — Anesthesia Preprocedure Evaluation (Signed)
Anesthesia Evaluation  Patient identified by MRN, date of birth, ID band Patient awake    Reviewed: Allergy & Precautions, H&P , NPO status , Patient's Chart, lab work & pertinent test results  History of Anesthesia Complications Negative for: history of anesthetic complications  Airway Mallampati: III TM Distance: >3 FB Neck ROM: Full    Dental  (+) Dental Advisory Given and Poor Dentition   Pulmonary shortness of breath and with exertion, sleep apnea ,    Pulmonary exam normal       Cardiovascular     Neuro/Psych  Headaches, Seizures -, Well Controlled,  PSYCHIATRIC DISORDERS Anxiety Bipolar Disorder  Neuromuscular disease    GI/Hepatic Neg liver ROS, GERD-  Medicated,  Endo/Other  diabetes  Renal/GU Renal InsufficiencyRenal disease     Musculoskeletal   Abdominal   Peds  Hematology   Anesthesia Other Findings   Reproductive/Obstetrics                           Anesthesia Physical Anesthesia Plan  ASA: III  Anesthesia Plan: General   Post-op Pain Management:    Induction: Intravenous  Airway Management Planned: Oral ETT  Additional Equipment:   Intra-op Plan:   Post-operative Plan: Extubation in OR  Informed Consent: I have reviewed the patients History and Physical, chart, labs and discussed the procedure including the risks, benefits and alternatives for the proposed anesthesia with the patient or authorized representative who has indicated his/her understanding and acceptance.   Dental advisory given  Plan Discussed with: CRNA, Anesthesiologist and Surgeon  Anesthesia Plan Comments:         Anesthesia Quick Evaluation

## 2012-05-09 NOTE — Preoperative (Signed)
Beta Blockers   Reason not to administer Beta Blockers:Not Applicable 

## 2012-05-09 NOTE — Progress Notes (Signed)
Bladder scanned pt with greater than 367ml. Pt requesting to be cath. Has not taken Flomax or Lasix at this time. Home meds to be resumed tonight

## 2012-05-10 ENCOUNTER — Emergency Department (HOSPITAL_COMMUNITY): Payer: Medicare Other

## 2012-05-10 ENCOUNTER — Inpatient Hospital Stay (HOSPITAL_COMMUNITY)
Admission: EM | Admit: 2012-05-10 | Discharge: 2012-05-12 | DRG: 189 | Disposition: A | Discharge status: Home / Self Care | Payer: Medicare Other | Attending: Internal Medicine | Admitting: Internal Medicine

## 2012-05-10 ENCOUNTER — Encounter (HOSPITAL_COMMUNITY): Payer: Self-pay

## 2012-05-10 DIAGNOSIS — E860 Dehydration: Secondary | ICD-10-CM | POA: Diagnosis present

## 2012-05-10 DIAGNOSIS — Z794 Long term (current) use of insulin: Secondary | ICD-10-CM

## 2012-05-10 DIAGNOSIS — Z91013 Allergy to seafood: Secondary | ICD-10-CM

## 2012-05-10 DIAGNOSIS — K219 Gastro-esophageal reflux disease without esophagitis: Secondary | ICD-10-CM | POA: Diagnosis present

## 2012-05-10 DIAGNOSIS — G4733 Obstructive sleep apnea (adult) (pediatric): Secondary | ICD-10-CM | POA: Diagnosis present

## 2012-05-10 DIAGNOSIS — Z91038 Other insect allergy status: Secondary | ICD-10-CM

## 2012-05-10 DIAGNOSIS — T481X5A Adverse effect of skeletal muscle relaxants [neuromuscular blocking agents], initial encounter: Secondary | ICD-10-CM | POA: Diagnosis present

## 2012-05-10 DIAGNOSIS — J9600 Acute respiratory failure, unspecified whether with hypoxia or hypercapnia: Secondary | ICD-10-CM | POA: Diagnosis present

## 2012-05-10 DIAGNOSIS — Z91199 Patient's noncompliance with other medical treatment and regimen due to unspecified reason: Secondary | ICD-10-CM

## 2012-05-10 DIAGNOSIS — Z91040 Latex allergy status: Secondary | ICD-10-CM

## 2012-05-10 DIAGNOSIS — J9620 Acute and chronic respiratory failure, unspecified whether with hypoxia or hypercapnia: Principal | ICD-10-CM | POA: Diagnosis present

## 2012-05-10 DIAGNOSIS — T4275XA Adverse effect of unspecified antiepileptic and sedative-hypnotic drugs, initial encounter: Secondary | ICD-10-CM | POA: Diagnosis present

## 2012-05-10 DIAGNOSIS — F411 Generalized anxiety disorder: Secondary | ICD-10-CM | POA: Diagnosis present

## 2012-05-10 DIAGNOSIS — R0602 Shortness of breath: Secondary | ICD-10-CM

## 2012-05-10 DIAGNOSIS — R4182 Altered mental status, unspecified: Secondary | ICD-10-CM | POA: Diagnosis present

## 2012-05-10 DIAGNOSIS — R7989 Other specified abnormal findings of blood chemistry: Secondary | ICD-10-CM | POA: Diagnosis present

## 2012-05-10 DIAGNOSIS — F319 Bipolar disorder, unspecified: Secondary | ICD-10-CM | POA: Diagnosis present

## 2012-05-10 DIAGNOSIS — D649 Anemia, unspecified: Secondary | ICD-10-CM | POA: Diagnosis present

## 2012-05-10 DIAGNOSIS — E1149 Type 2 diabetes mellitus with other diabetic neurological complication: Secondary | ICD-10-CM | POA: Diagnosis present

## 2012-05-10 DIAGNOSIS — Z6841 Body Mass Index (BMI) 40.0 and over, adult: Secondary | ICD-10-CM

## 2012-05-10 DIAGNOSIS — Z86718 Personal history of other venous thrombosis and embolism: Secondary | ICD-10-CM

## 2012-05-10 DIAGNOSIS — R0902 Hypoxemia: Secondary | ICD-10-CM | POA: Diagnosis present

## 2012-05-10 DIAGNOSIS — Z7901 Long term (current) use of anticoagulants: Secondary | ICD-10-CM

## 2012-05-10 DIAGNOSIS — E662 Morbid (severe) obesity with alveolar hypoventilation: Secondary | ICD-10-CM | POA: Diagnosis present

## 2012-05-10 DIAGNOSIS — D696 Thrombocytopenia, unspecified: Secondary | ICD-10-CM | POA: Diagnosis present

## 2012-05-10 DIAGNOSIS — Z79899 Other long term (current) drug therapy: Secondary | ICD-10-CM

## 2012-05-10 DIAGNOSIS — M129 Arthropathy, unspecified: Secondary | ICD-10-CM | POA: Diagnosis present

## 2012-05-10 DIAGNOSIS — R799 Abnormal finding of blood chemistry, unspecified: Secondary | ICD-10-CM

## 2012-05-10 DIAGNOSIS — T40605A Adverse effect of unspecified narcotics, initial encounter: Secondary | ICD-10-CM | POA: Diagnosis present

## 2012-05-10 DIAGNOSIS — G43909 Migraine, unspecified, not intractable, without status migrainosus: Secondary | ICD-10-CM | POA: Diagnosis present

## 2012-05-10 DIAGNOSIS — G40909 Epilepsy, unspecified, not intractable, without status epilepticus: Secondary | ICD-10-CM | POA: Diagnosis present

## 2012-05-10 DIAGNOSIS — N189 Chronic kidney disease, unspecified: Secondary | ICD-10-CM | POA: Diagnosis present

## 2012-05-10 DIAGNOSIS — G894 Chronic pain syndrome: Secondary | ICD-10-CM | POA: Diagnosis present

## 2012-05-10 DIAGNOSIS — E1142 Type 2 diabetes mellitus with diabetic polyneuropathy: Secondary | ICD-10-CM | POA: Diagnosis present

## 2012-05-10 DIAGNOSIS — Z66 Do not resuscitate: Secondary | ICD-10-CM | POA: Diagnosis present

## 2012-05-10 DIAGNOSIS — E119 Type 2 diabetes mellitus without complications: Secondary | ICD-10-CM | POA: Diagnosis present

## 2012-05-10 DIAGNOSIS — Z86711 Personal history of pulmonary embolism: Secondary | ICD-10-CM

## 2012-05-10 DIAGNOSIS — N179 Acute kidney failure, unspecified: Secondary | ICD-10-CM | POA: Diagnosis present

## 2012-05-10 DIAGNOSIS — Z880 Allergy status to penicillin: Secondary | ICD-10-CM

## 2012-05-10 DIAGNOSIS — Z9119 Patient's noncompliance with other medical treatment and regimen: Secondary | ICD-10-CM

## 2012-05-10 DIAGNOSIS — Z23 Encounter for immunization: Secondary | ICD-10-CM

## 2012-05-10 HISTORY — DX: Chronic pain syndrome: G89.4

## 2012-05-10 HISTORY — DX: Morbid (severe) obesity with alveolar hypoventilation: E66.2

## 2012-05-10 HISTORY — DX: Chronic respiratory failure with hypoxia: J96.11

## 2012-05-10 HISTORY — DX: Thrombocytopenia, unspecified: D69.6

## 2012-05-10 HISTORY — DX: Obstructive sleep apnea (adult) (pediatric): G47.33

## 2012-05-10 LAB — BASIC METABOLIC PANEL
BUN: 31 mg/dL — ABNORMAL HIGH (ref 6–23)
CO2: 33 mEq/L — ABNORMAL HIGH (ref 19–32)
Calcium: 8.5 mg/dL (ref 8.4–10.5)
Chloride: 95 mEq/L — ABNORMAL LOW (ref 96–112)
Creatinine, Ser: 1.83 mg/dL — ABNORMAL HIGH (ref 0.50–1.35)
GFR calc Af Amer: 50 mL/min — ABNORMAL LOW (ref >90)
GFR calc non Af Amer: 43 mL/min — ABNORMAL LOW (ref >90)
Glucose, Bld: 206 mg/dL — ABNORMAL HIGH (ref 70–99)
Potassium: 4.9 mEq/L (ref 3.5–5.1)
Sodium: 135 mEq/L (ref 135–145)

## 2012-05-10 LAB — BLOOD GAS, ARTERIAL
Acid-Base Excess: 7 mmol/L — ABNORMAL HIGH (ref 0.0–2.0)
Bicarbonate: 32.1 mEq/L — ABNORMAL HIGH (ref 20.0–24.0)
O2 Saturation: 67.7 %
Patient temperature: 37
TCO2: 30 mmol/L (ref 0–100)
pCO2 arterial: 56.1 mmHg — ABNORMAL HIGH (ref 35.0–45.0)
pH, Arterial: 7.375 (ref 7.350–7.450)
pO2, Arterial: 36.9 mmHg — CL (ref 80.0–100.0)

## 2012-05-10 LAB — CBC WITH DIFFERENTIAL/PLATELET
Basophils Absolute: 0 10*3/uL (ref 0.0–0.1)
Basophils Relative: 0 % (ref 0–1)
Eosinophils Absolute: 0.2 10*3/uL (ref 0.0–0.7)
Eosinophils Relative: 2 % (ref 0–5)
HCT: 33.7 % — ABNORMAL LOW (ref 39.0–52.0)
Hemoglobin: 10 g/dL — ABNORMAL LOW (ref 13.0–17.0)
Lymphocytes Relative: 12 % (ref 12–46)
Lymphs Abs: 1.4 10*3/uL (ref 0.7–4.0)
MCH: 25.1 pg — ABNORMAL LOW (ref 26.0–34.0)
MCHC: 29.7 g/dL — ABNORMAL LOW (ref 30.0–36.0)
MCV: 84.7 fL (ref 78.0–100.0)
Monocytes Absolute: 0.8 10*3/uL (ref 0.1–1.0)
Monocytes Relative: 7 % (ref 3–12)
Neutro Abs: 8.9 10*3/uL — ABNORMAL HIGH (ref 1.7–7.7)
Neutrophils Relative %: 78 % — ABNORMAL HIGH (ref 43–77)
Platelets: 141 10*3/uL — ABNORMAL LOW (ref 150–400)
RBC: 3.98 MIL/uL — ABNORMAL LOW (ref 4.22–5.81)
RDW: 18.8 % — ABNORMAL HIGH (ref 11.5–15.5)
WBC: 11.3 10*3/uL — ABNORMAL HIGH (ref 4.0–10.5)

## 2012-05-10 LAB — PROTIME-INR
INR: 1.35 (ref 0.00–1.49)
Prothrombin Time: 16.4 seconds — ABNORMAL HIGH (ref 11.6–15.2)

## 2012-05-10 LAB — GLUCOSE, CAPILLARY
Glucose-Capillary: 259 mg/dL — ABNORMAL HIGH (ref 70–99)
Glucose-Capillary: 209 mg/dL — ABNORMAL HIGH (ref 70–99)

## 2012-05-10 LAB — TROPONIN I: Troponin I: <0.3 ng/mL (ref <0.30)

## 2012-05-10 LAB — MRSA PCR SCREENING: MRSA by PCR: NEGATIVE

## 2012-05-10 MED ORDER — WARFARIN SODIUM 10 MG PO TABS
10.0000 mg | ORAL_TABLET | ORAL | Status: AC
  Administered 2012-05-10: 10 mg via ORAL
  Filled 2012-05-10: qty 1

## 2012-05-10 MED ORDER — PANTOPRAZOLE SODIUM 40 MG IV SOLR
40.0000 mg | Freq: Every day | INTRAVENOUS | Status: DC
  Administered 2012-05-10 – 2012-05-11 (×2): 40 mg via INTRAVENOUS
  Filled 2012-05-10 (×2): qty 40

## 2012-05-10 MED ORDER — FUROSEMIDE 40 MG PO TABS
60.0000 mg | ORAL_TABLET | Freq: Every day | ORAL | Status: DC
  Administered 2012-05-11 – 2012-05-12 (×2): 60 mg via ORAL
  Filled 2012-05-10 (×3): qty 1

## 2012-05-10 MED ORDER — POLYVINYL ALCOHOL 1.4 % OP SOLN
OPHTHALMIC | Status: AC
  Filled 2012-05-10: qty 15

## 2012-05-10 MED ORDER — ALBUTEROL SULFATE (5 MG/ML) 0.5% IN NEBU
5.0000 mg | INHALATION_SOLUTION | Freq: Once | RESPIRATORY_TRACT | Status: DC
  Filled 2012-05-10: qty 1

## 2012-05-10 MED ORDER — FLUTICASONE PROPIONATE 50 MCG/ACT NA SUSP
1.0000 | Freq: Two times a day (BID) | NASAL | Status: DC
  Administered 2012-05-10 – 2012-05-11 (×3): 1 via NASAL
  Filled 2012-05-10: qty 16

## 2012-05-10 MED ORDER — SODIUM CHLORIDE 0.9 % IJ SOLN: 3.0000 mL | Freq: Two times a day (BID) | INTRAMUSCULAR | Status: DC

## 2012-05-10 MED ORDER — ENOXAPARIN SODIUM 100 MG/ML ~~LOC~~ SOLN
100.0000 mg | Freq: Two times a day (BID) | SUBCUTANEOUS | Status: DC
  Administered 2012-05-10 – 2012-05-12 (×4): 100 mg via SUBCUTANEOUS
  Filled 2012-05-10 (×4): qty 1

## 2012-05-10 MED ORDER — INSULIN ASPART 100 UNIT/ML ~~LOC~~ SOLN
0.0000 [IU] | Freq: Every day | SUBCUTANEOUS | Status: DC
  Administered 2012-05-10: 2 [IU] via SUBCUTANEOUS

## 2012-05-10 MED ORDER — ALBUTEROL SULFATE (5 MG/ML) 0.5% IN NEBU
5.0000 mg | INHALATION_SOLUTION | Freq: Once | RESPIRATORY_TRACT | Status: AC
  Administered 2012-05-10: 5 mg via RESPIRATORY_TRACT
  Filled 2012-05-10: qty 1

## 2012-05-10 MED ORDER — FLUTICASONE PROPIONATE 50 MCG/ACT NA SUSP
NASAL | Status: AC
  Filled 2012-05-10: qty 16

## 2012-05-10 MED ORDER — ACETAMINOPHEN 325 MG PO TABS
650.0000 mg | ORAL_TABLET | Freq: Four times a day (QID) | ORAL | Status: DC | PRN
  Administered 2012-05-11: 650 mg via ORAL
  Filled 2012-05-10: qty 2

## 2012-05-10 MED ORDER — WARFARIN - PHARMACIST DOSING INPATIENT: Freq: Every day | Status: DC

## 2012-05-10 MED ORDER — INFLUENZA VIRUS VACC SPLIT PF IM SUSP
0.5000 mL | INTRAMUSCULAR | Status: AC
  Administered 2012-05-11: 0.5 mL via INTRAMUSCULAR
  Filled 2012-05-10: qty 0.5

## 2012-05-10 MED ORDER — CLINDAMYCIN HCL 150 MG PO CAPS
300.0000 mg | ORAL_CAPSULE | Freq: Three times a day (TID) | ORAL | Status: DC
  Administered 2012-05-11 – 2012-05-12 (×4): 300 mg via ORAL
  Filled 2012-05-10 (×2): qty 2
  Filled 2012-05-10: qty 1
  Filled 2012-05-10 (×4): qty 2

## 2012-05-10 MED ORDER — WARFARIN SODIUM 10 MG PO TABS: 10.0000 mg | ORAL_TABLET | Freq: Once | ORAL | Status: DC

## 2012-05-10 MED ORDER — ONDANSETRON HCL 4 MG/2ML IJ SOLN: 4.0000 mg | Freq: Four times a day (QID) | INTRAMUSCULAR | Status: DC | PRN

## 2012-05-10 MED ORDER — SODIUM CHLORIDE 0.9 % IV BOLUS (SEPSIS)
1000.0000 mL | Freq: Once | INTRAVENOUS | Status: AC
Start: 2012-05-10 — End: 2012-05-10
  Administered 2012-05-10: 1000 mL via INTRAVENOUS

## 2012-05-10 MED ORDER — INSULIN ASPART 100 UNIT/ML ~~LOC~~ SOLN
0.0000 [IU] | Freq: Three times a day (TID) | SUBCUTANEOUS | Status: DC
  Administered 2012-05-11: 3 [IU] via SUBCUTANEOUS
  Administered 2012-05-11: 2 [IU] via SUBCUTANEOUS

## 2012-05-10 MED ORDER — ENOXAPARIN SODIUM 120 MG/0.8ML ~~LOC~~ SOLN
110.0000 mg | Freq: Two times a day (BID) | SUBCUTANEOUS | Status: DC
  Administered 2012-05-10 – 2012-05-12 (×4): 110 mg via SUBCUTANEOUS
  Filled 2012-05-10 (×6): qty 0.8

## 2012-05-10 MED ORDER — SODIUM CHLORIDE 0.9 % IV BOLUS (SEPSIS)
500.0000 mL | Freq: Once | INTRAVENOUS | Status: AC
  Administered 2012-05-10: 500 mL via INTRAVENOUS

## 2012-05-10 MED ORDER — SODIUM CHLORIDE 0.9 % IJ SOLN: 3.0000 mL | INTRAMUSCULAR | Status: DC | PRN

## 2012-05-10 MED ORDER — LEVETIRACETAM 500 MG PO TABS
500.0000 mg | ORAL_TABLET | Freq: Two times a day (BID) | ORAL | Status: DC
  Administered 2012-05-10 – 2012-05-12 (×4): 500 mg via ORAL
  Filled 2012-05-10 (×4): qty 1

## 2012-05-10 MED ORDER — ONDANSETRON HCL 4 MG PO TABS: 4.0000 mg | ORAL_TABLET | Freq: Four times a day (QID) | ORAL | Status: DC | PRN

## 2012-05-10 MED ORDER — HYPROMELLOSE (GONIOSCOPIC) 2.5 % OP SOLN
1.0000 [drp] | Freq: Two times a day (BID) | OPHTHALMIC | Status: DC
  Administered 2012-05-10: 2 [drp] via OPHTHALMIC
  Filled 2012-05-10: qty 15

## 2012-05-10 MED ORDER — SODIUM CHLORIDE 0.9 % IJ SOLN
3.0000 mL | Freq: Two times a day (BID) | INTRAMUSCULAR | Status: DC
  Administered 2012-05-11 – 2012-05-12 (×2): 3 mL via INTRAVENOUS
  Filled 2012-05-10 (×2): qty 3

## 2012-05-10 MED ORDER — IPRATROPIUM BROMIDE 0.02 % IN SOLN
0.5000 mg | Freq: Once | RESPIRATORY_TRACT | Status: AC
  Administered 2012-05-10: 0.5 mg via RESPIRATORY_TRACT
  Filled 2012-05-10: qty 2.5

## 2012-05-10 MED ORDER — ALBUTEROL SULFATE (5 MG/ML) 0.5% IN NEBU: 2.5000 mg | INHALATION_SOLUTION | RESPIRATORY_TRACT | Status: DC | PRN

## 2012-05-10 MED ORDER — LAMOTRIGINE 200 MG PO TABS
200.0000 mg | ORAL_TABLET | Freq: Two times a day (BID) | ORAL | Status: DC
  Administered 2012-05-10 – 2012-05-12 (×4): 200 mg via ORAL
  Filled 2012-05-10 (×6): qty 1

## 2012-05-10 MED ORDER — IPRATROPIUM BROMIDE 0.02 % IN SOLN
0.5000 mg | Freq: Once | RESPIRATORY_TRACT | Status: DC
Start: 2012-05-10 — End: 2012-05-10
  Filled 2012-05-10: qty 2.5

## 2012-05-10 MED ORDER — INSULIN GLARGINE 100 UNIT/ML ~~LOC~~ SOLN
35.0000 [IU] | Freq: Every day | SUBCUTANEOUS | Status: DC
  Administered 2012-05-10: 35 [IU] via SUBCUTANEOUS

## 2012-05-10 MED ORDER — SODIUM CHLORIDE 0.9 % IV SOLN: INTRAVENOUS | Status: AC

## 2012-05-10 MED ORDER — ACETAMINOPHEN 650 MG RE SUPP: 650.0000 mg | Freq: Four times a day (QID) | RECTAL | Status: DC | PRN

## 2012-05-10 MED ORDER — NALOXONE HCL 1 MG/ML IJ SOLN
1.0000 mg | Freq: Once | INTRAMUSCULAR | Status: AC
  Administered 2012-05-10: 1 mg via INTRAVENOUS
  Filled 2012-05-10: qty 2

## 2012-05-10 NOTE — Progress Notes (Signed)
Patient's O2 sats stays in the 70's and 80 %. On O2 @ 3 L/min nasal cannula. Dr. Hoyt Koch notified and ordered CPAP. Respiratory notified but patient declined CPAP and started crying. States anything to his face makes him feel clustered. Patient's O2 dropped down to upper 60's when respiratory therapist was assessing. Non re-breather mask aided sats to go up to 90's. Patient still refuses to keep it on. Dr. Hoyt Koch notified again and ordered tele monitoring, continuous pulse ox, and Venti mask. Order carried out. Patient encouraged to use mask and he has it on at this time with wife at bedside. Will continue to monitor..........................................................................................................D.Tom-Johnson RN

## 2012-05-10 NOTE — ED Notes (Signed)
SpO2 88% on O2 4L increased to 5L/min per [employer].  SpO2 increased to 89-92%.

## 2012-05-10 NOTE — Progress Notes (Signed)
[name] PROGRESS NOTE:   SUBJECTIVE: Doing better. No bleeding, minimal pain.  OBJECTIVE:  Vitals: Blood pressure 122/57, pulse 71, temperature 98.6 F (37 C), temperature source Axillary, resp. rate 20, height 6\' 3"  (1.905 m), weight 212.193 kg (467 lb 12.8 oz), SpO2 99.00%. Lab results: Results for orders placed during the hospital encounter of 05/09/12 (from the past 24 hour(s))  SURGICAL PCR SCREEN     Status: Normal   Collection Time   05/09/12 11:31 AM      Component Value Range   MRSA, PCR NEGATIVE  NEGATIVE   Staphylococcus aureus NEGATIVE  NEGATIVE  GLUCOSE, CAPILLARY     Status: Abnormal   Collection Time   05/09/12  9:51 PM      Component Value Range   Glucose-Capillary 184 (*) 70 - 99 mg/dL   Comment 1 Notify RN     Radiology Results: No results found. General appearance: alert, cooperative, no distress and morbidly obese Throat: hemostatic. minimal edema. Pharynx clear.  Neck: no adenopathy and supple, symmetrical, trachea midline  ASSESSMENT: Stable post op.  PLAN: D/C home today.   Gae Bon 05/10/2012

## 2012-05-10 NOTE — Progress Notes (Signed)
At around 0300 this am, patient's finger nail bed noted to be cyanotic. Charge nurse witnessed and encouraged patient to keep his CPAP on so he could be perfused. Patient agreed. Patient more alert and talking this am. Cyanosis to nail bed resolved.

## 2012-05-10 NOTE — Progress Notes (Signed)
Patient removed telemetry monitor, O2 and pulse oximeter  as soon as Dr. Hoyt Koch told him he can discharged despite explanation.

## 2012-05-10 NOTE — Progress Notes (Addendum)
ANTICOAGULATION CONSULT NOTE - Initial Consult  Pharmacy Consult for Lovenox & Warfarin Indication: pulmonary embolus and DVT  Allergies  Allergen Reactions  . Bee Venom Shortness Of Breath and Swelling  . Penicillins Shortness Of Breath  . Iohexol      Code: RASH, Desc: PT WAS ON PREDNISONE FOR GOUT TX. @ TIME OF SCAN AND RECEIVED 50 MG OF BENADRYL IV-ARS 10/08/07   . Shellfish Allergy Nausea And Vomiting and Other (See Comments)    Feels like insides are twisting  . Iodine Rash  . Latex Rash    Patient Measurements: Height: 6\' 3"  (190.5 cm) Weight: 467 lb (211.83 kg) IBW/kg (Calculated) : 84.5   Vital Signs: Temp: 98.6 F (37 C) (11/12 1349) Temp src: Oral (11/12 1349) BP: 136/51 mmHg (11/12 1707) Pulse Rate: 87  (11/12 1707)  Labs:  Basename 05/10/12 1359 05/09/12 0637  HGB 10.0* --  HCT 33.7* --  PLT 141* --  APTT -- 40*  LABPROT 16.4* 18.0*  INR 1.35 1.54*  HEPARINUNFRC -- --  CREATININE 1.83* --  CKTOTAL -- --  CKMB -- --  TROPONINI <0.30 --    Estimated Creatinine Clearance: 97.6 ml/min (by C-G formula based on Cr of 1.83).   Medical History: Past Medical History  Diagnosis Date  . Sleep apnea     CPAP  . Seizures   . Migraine   . Anxiety   . Mental disorder   . Bipolar 1 disorder   . Bronchitis     hx of  . Dyspnea     with ambulation  . Diabetes mellitus without complication   . Neuromuscular disorder   . Diabetic neuropathy   . Arthritis   . GERD (gastroesophageal reflux disease)   . Anemia   . Bruises easily   . Chronic kidney disease     decreased left kidney fx  . DVT (deep venous thrombosis)     LLE DVT ~ '12  . PE (pulmonary embolism)     bilateral PE ~ '11   Medications:  Medications Prior to Admission  Medication Sig Dispense Refill  . busPIRone (BUSPAR) 15 MG tablet Take 15 mg by mouth 2 (two) times daily.      . Canagliflozin (INVOKANA) 100 MG TABS Take 100 mg by mouth daily.      . citalopram (CELEXA) 40 MG tablet  Take 60 mg by mouth at bedtime.      . cyclobenzaprine (FLEXERIL) 5 MG tablet Take 5 mg by mouth 2 (two) times daily as needed. For muscle spasms      . docusate sodium (COLACE) 100 MG capsule Take 100 mg by mouth 2 (two) times daily.      . Fe Fum-FA-B Cmp-C-Zn-Mg-Mn-Cu (HEMOCYTE PLUS PO) Take 1 tablet by mouth daily.      . fentaNYL (DURAGESIC - DOSED MCG/HR) 75 MCG/HR Place 1 patch onto the skin every 3 (three) days.      . fluticasone (FLONASE) 50 MCG/ACT nasal spray Place 1 spray into the nose 2 (two) times daily.      . furosemide (LASIX) 40 MG tablet Take 60 mg by mouth daily.      . hydroxypropyl methylcellulose (ISOPTO TEARS) 2.5 % ophthalmic solution Place 1-2 drops into both eyes 2 (two) times daily.      . insulin aspart (NOVOLOG) 100 UNIT/ML injection Inject 2-12 Units into the skin 4 (four) times daily -  before meals and at bedtime.      . insulin glargine (LANTUS)  100 UNIT/ML injection Inject 110 Units into the skin at bedtime.      . lamoTRIgine (LAMICTAL) 200 MG tablet Take 200 mg by mouth 2 (two) times daily.      Marland Kitchen levETIRAcetam (KEPPRA) 500 MG tablet Take 500 mg by mouth 2 (two) times daily.      . Multiple Vitamin (MULTIVITAMIN WITH MINERALS) TABS Take 1 tablet by mouth daily.      Marland Kitchen omeprazole (PRILOSEC) 20 MG capsule Take 20 mg by mouth 2 (two) times daily.      Marland Kitchen oxyCODONE-acetaminophen (PERCOCET) 5-325 MG per tablet Take 1-2 tablets by mouth every 4 (four) hours as needed for pain.  40 tablet  0  . potassium chloride (K-DUR) 10 MEQ tablet Take 30 mEq by mouth daily.      . pregabalin (LYRICA) 50 MG capsule Take 50 mg by mouth 3 (three) times daily.      . simvastatin (ZOCOR) 20 MG tablet Take 20 mg by mouth at bedtime.      . Tamsulosin HCl (FLOMAX) 0.4 MG CAPS Take 0.4 mg by mouth at bedtime.      Marland Kitchen warfarin (COUMADIN) 5 MG tablet Take 7.5-10 mg by mouth daily. Take 1.5 tab every day except thursdays take 2 tabs      . zolpidem (AMBIEN) 5 MG tablet Take 2.5 mg by mouth  at bedtime as needed. For sleep      . clindamycin (CLEOCIN) 300 MG capsule Take 1 capsule (300 mg total) by mouth 3 (three) times daily.  21 capsule  0   Assessment: Okay for Protocol Recent Dental Extraction Procedure on (11/11). Patient with a history of DVT and PE on Warfarin at home but sub therapeutic INR.   Goal of Therapy:  Anti-Xa level 0.6-1.2 units/ml 4hrs after LMWH dose given Monitor platelets by anticoagulation protocol: Yes INR goal 2-3   Plan:  Lovenox 1mg /kg SQ every 12 hours. CBC monitoring. Consider Anti-Xa level at steady state due to obesity. Warfarin 10mg  PO x 1. Daily INR  Pricilla Larsson 05/10/2012,6:27 PM
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
## 2012-05-10 NOTE — Progress Notes (Signed)
Placed patient on Venturi Mask set at 50% 15lpm flow.  Patient saturating 97%.  Still refusing CPAP
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
## 2012-05-10 NOTE — Progress Notes (Signed)
Per patient, he requested to be a DNR and stated that he did not want CPR nor intubation performed. This was stated in the presence of his fiance, Truman Hayward, respiratory therapist, Priscille Kluver, and myself.

## 2012-05-10 NOTE — ED Notes (Signed)
Fentanyl patch removed from right upper arm.

## 2012-05-10 NOTE — Discharge Summary (Signed)
Physician Discharge Summary  Patient ID: [name] MRN: [id number] DOB/AGE: 1965-08-02 46 y.o.  Admit date: 05/09/2012 Discharge date: 05/10/2012  Admission Diagnoses: Chronic dental caries, nonrestorable teeth, morbid obesity  Discharge Diagnoses:  Active Problems:  * No active hospital problems. *    Discharged Condition: good  Hospital Course: Admitted 05/09/2012 post-operatively for observation. Patient received oxygen therapy, pain management, IV fluids  overnight. Discharged 05/10/2012 to home.  Consults: None    Discharge Exam: Blood pressure 122/57, pulse 71, temperature 98.6 F (37 C), temperature source Axillary, resp. rate 20, height 6\' 3"  (1.905 m), weight 212.193 kg (467 lb 12.8 oz), SpO2 99.00%. General appearance: alert, no distress and morbidly obese Throat: hemostatic. Sutues intact. mimimal edema. Neck: no adenopathy  Disposition: 03-Skilled Nursing Facility  Discharge Orders    Future Orders Please Complete By Expires   Diet - low sodium heart healthy      Comments:   Soft Diet. Advance as tolerated.   Diet - low sodium heart healthy      Comments:   Soft Diet. Advance as tolerated.   Increase activity slowly      Call MD for:  temperature >100.4      Call MD for:  persistant nausea and vomiting      Call MD for:  severe uncontrolled pain      Call MD for:  difficulty breathing, headache or visual disturbances      Discharge instructions      Comments:   Warm salt water mouth rinses 4-5 times per day starting the day after surgery. Ice to affected area for 2-3 days. Soft diet, advance as tolerated. No smoking for 2 weeks. Follow-up visit with Dr. Hoyt Koch as scheduled. Call [phone] for problems.   Increase activity slowly      Call MD for:  temperature >100.4      Call MD for:  persistant nausea and vomiting      Call MD for:  severe uncontrolled pain      Call MD for:  difficulty breathing, headache or visual disturbances      Discharge instructions      Comments:   Warm salt water mouth rinses 4-5 times per day starting the day after surgery. Ice to affected area for 2-3 days. Soft diet, advance as tolerated. No smoking for 2 weeks. Follow-up visit with Dr. Hoyt Koch as scheduled. Call [phone] for problems.       Medication List     As of 05/10/2012  8:11 AM    TAKE these medications         busPIRone 15 MG tablet   Commonly known as: BUSPAR   Take 15 mg by mouth 2 (two) times daily.      citalopram 40 MG tablet   Commonly known as: CELEXA   Take 60 mg by mouth at bedtime.      clindamycin 300 MG capsule   Commonly known as: CLEOCIN   Take 1 capsule (300 mg total) by mouth 3 (three) times daily.      cyclobenzaprine 5 MG tablet   Commonly known as: FLEXERIL   Take 5 mg by mouth 2 (two) times daily as needed. For muscle spasms      docusate sodium 100 MG capsule   Commonly known as: COLACE   Take 100 mg by mouth 2 (two) times daily.      fentaNYL 75 MCG/HR   Commonly known as: DURAGESIC - dosed mcg/hr   Place 1 patch onto  the skin every 3 (three) days.      fluticasone 50 MCG/ACT nasal spray   Commonly known as: FLONASE   Place 1 spray into the nose 2 (two) times daily.      furosemide 40 MG tablet   Commonly known as: LASIX   Take 60 mg by mouth daily.      HEMOCYTE PLUS PO   Take 1 tablet by mouth daily.      hydroxypropyl methylcellulose 2.5 % ophthalmic solution   Commonly known as: ISOPTO TEARS   Place 1-2 drops into both eyes 2 (two) times daily.      insulin aspart 100 UNIT/ML injection   Commonly known as: novoLOG   Inject 2-12 Units into the skin 4 (four) times daily -  before meals and at bedtime.      insulin glargine 100 UNIT/ML injection   Commonly known as: LANTUS   Inject 110 Units into the skin at bedtime.      INVOKANA 100 MG Tabs   Generic drug: Canagliflozin   Take 100 mg by mouth daily.      lamoTRIgine 200 MG tablet   Commonly known as: LAMICTAL   Take  200 mg by mouth 2 (two) times daily.      levETIRAcetam 500 MG tablet   Commonly known as: KEPPRA   Take 500 mg by mouth 2 (two) times daily.      multivitamin with minerals Tabs   Take 1 tablet by mouth daily.      omeprazole 20 MG capsule   Commonly known as: PRILOSEC   Take 20 mg by mouth 2 (two) times daily.      oxyCODONE-acetaminophen 5-325 MG per tablet   Commonly known as: PERCOCET/ROXICET   Take 1-2 tablets by mouth every 4 (four) hours as needed for pain.      potassium chloride 10 MEQ tablet   Commonly known as: K-DUR   Take 30 mEq by mouth daily.      pregabalin 50 MG capsule   Commonly known as: LYRICA   Take 50 mg by mouth 3 (three) times daily.      simvastatin 20 MG tablet   Commonly known as: ZOCOR   Take 20 mg by mouth at bedtime.      Tamsulosin HCl 0.4 MG Caps   Commonly known as: FLOMAX   Take 0.4 mg by mouth at bedtime.      warfarin 5 MG tablet   Commonly known as: COUMADIN   Take 7.5-10 mg by mouth daily. Take 1.5 tab every day except thursdays take 2 tabs      zolpidem 5 MG tablet   Commonly known as: AMBIEN   Take 2.5 mg by mouth at bedtime as needed. For sleep         Signed: Gae Bon 05/10/2012, 8:11 AM

## 2012-05-10 NOTE — ED Notes (Signed)
Pt reports was put to sleep yesterday for dental surgery at [facility].  Reports was admitted over night because they couldn't keep his 02 sat up.  Pt was sent home this morning and when got home sat was in 50's.  Pt went to his pcp to try to get some 02 for home but was sent here.  Pt says hasn't taken any pain medication today other than the fentanyl patch that he usually wears.  Pt appears very drowsy.  Also reports fever today.

## 2012-05-10 NOTE — ED Notes (Signed)
Attempted to call report to ICU  Nurse to call back  

## 2012-05-10 NOTE — Progress Notes (Signed)
Patient discharged to home with instructions. 

## 2012-05-10 NOTE — ED Notes (Signed)
Room air SpO2 75%; placed on O2 4L/min per [place]

## 2012-05-10 NOTE — ED Notes (Signed)
CRITICAL VALUE ALERT  Critical value received: pO2 Date of notification:  05/10/12  Time of notification:  [id number]  Critical value read back:yes  Nurse who received alert:  A. Caprice Beaver  MD notified (1st page):  [place].   Orders received.

## 2012-05-10 NOTE — Progress Notes (Signed)
Called to patients room per RN to initiate Cpap.  Patient did not have Cpap order and the nurse then obtained.  I brought the patient a machine to the room, checked his 02 saturation and found him to be saturating in the 70s.  I placed the cpap mask on him with an oxygen bleed of 10 lpm.  I could not get the patients saturation to come up past 89 and then decided to  place him on non rebreather.  I got his saturation to come up to 100 but he removed the mask and said he don't want to wear it.  I then tried to put his Cpap mask back on him for his Cpap and he cursed at me and refused, stating for everyone to leave him alone.  I educated [name] as well as his fiance as best i could of the dangers of low blood oxygen levels, but he insisted i leave his bedside.  I then went to retrieve his nurse as well as the charge nurse and we all entered [name] room to try and convince him of the importance of wearing either the non rebreather or the Cpap.  He refused. I put him on nasal canula and left him hooked up to 02 monitor.   Nurse is calling the Doctor.

## 2012-05-11 ENCOUNTER — Encounter (HOSPITAL_COMMUNITY): Payer: Self-pay | Admitting: Internal Medicine

## 2012-05-11 DIAGNOSIS — R0902 Hypoxemia: Secondary | ICD-10-CM

## 2012-05-11 DIAGNOSIS — D696 Thrombocytopenia, unspecified: Secondary | ICD-10-CM

## 2012-05-11 DIAGNOSIS — D649 Anemia, unspecified: Secondary | ICD-10-CM | POA: Diagnosis present

## 2012-05-11 DIAGNOSIS — G4733 Obstructive sleep apnea (adult) (pediatric): Secondary | ICD-10-CM

## 2012-05-11 DIAGNOSIS — J9600 Acute respiratory failure, unspecified whether with hypoxia or hypercapnia: Secondary | ICD-10-CM

## 2012-05-11 DIAGNOSIS — G894 Chronic pain syndrome: Secondary | ICD-10-CM

## 2012-05-11 DIAGNOSIS — N179 Acute kidney failure, unspecified: Secondary | ICD-10-CM

## 2012-05-11 HISTORY — DX: Chronic pain syndrome: G89.4

## 2012-05-11 HISTORY — DX: Thrombocytopenia, unspecified: D69.6

## 2012-05-11 LAB — BLOOD GAS, ARTERIAL
Acid-Base Excess: 7.9 mmol/L — ABNORMAL HIGH (ref 0.0–2.0)
Acid-Base Excess: 7.8 mmol/L — ABNORMAL HIGH (ref 0.0–2.0)
Bicarbonate: 33.5 mEq/L — ABNORMAL HIGH (ref 20.0–24.0)
Bicarbonate: 33.2 mEq/L — ABNORMAL HIGH (ref 20.0–24.0)
Delivery systems: POSITIVE
Drawn by: 22223
FIO2: 32 %
Inspiratory PAP: 9
O2 Content: 6 L/min
O2 Content: 3 L/min
O2 Saturation: 91.5 %
O2 Saturation: 94.2 %
Patient temperature: 37
Patient temperature: 37
TCO2: 32 mmol/L (ref 0–100)
TCO2: 31.4 mmol/L (ref 0–100)
pCO2 arterial: 63.3 mmHg (ref 35.0–45.0)
pCO2 arterial: 59 mmHg (ref 35.0–45.0)
pH, Arterial: 7.344 — ABNORMAL LOW (ref 7.350–7.450)
pH, Arterial: 7.368 (ref 7.350–7.450)
pO2, Arterial: 63.2 mmHg — ABNORMAL LOW (ref 80.0–100.0)
pO2, Arterial: 65 mmHg — ABNORMAL LOW (ref 80.0–100.0)

## 2012-05-11 LAB — GLUCOSE, CAPILLARY
Glucose-Capillary: 135 mg/dL — ABNORMAL HIGH (ref 70–99)
Glucose-Capillary: 192 mg/dL — ABNORMAL HIGH (ref 70–99)
Glucose-Capillary: 124 mg/dL — ABNORMAL HIGH (ref 70–99)
Glucose-Capillary: 141 mg/dL — ABNORMAL HIGH (ref 70–99)

## 2012-05-11 LAB — COMPREHENSIVE METABOLIC PANEL
ALT: 22 U/L (ref 0–53)
AST: 26 U/L (ref 0–37)
Albumin: 2.5 g/dL — ABNORMAL LOW (ref 3.5–5.2)
Alkaline Phosphatase: 93 U/L (ref 39–117)
BUN: 31 mg/dL — ABNORMAL HIGH (ref 6–23)
CO2: 34 mEq/L — ABNORMAL HIGH (ref 19–32)
Calcium: 8.4 mg/dL (ref 8.4–10.5)
Chloride: 98 mEq/L (ref 96–112)
Creatinine, Ser: 1.84 mg/dL — ABNORMAL HIGH (ref 0.50–1.35)
GFR calc Af Amer: 49 mL/min — ABNORMAL LOW (ref >90)
GFR calc non Af Amer: 43 mL/min — ABNORMAL LOW (ref >90)
Glucose, Bld: 129 mg/dL — ABNORMAL HIGH (ref 70–99)
Potassium: 4 mEq/L (ref 3.5–5.1)
Sodium: 139 mEq/L (ref 135–145)
Total Bilirubin: 0.5 mg/dL (ref 0.3–1.2)
Total Protein: 7.2 g/dL (ref 6.0–8.3)

## 2012-05-11 LAB — IRON AND TIBC
Iron: 35 ug/dL — ABNORMAL LOW (ref 42–135)
Saturation Ratios: 13 % — ABNORMAL LOW (ref 20–55)
TIBC: 271 ug/dL (ref 215–435)
UIBC: 236 ug/dL (ref 125–400)

## 2012-05-11 LAB — PROTIME-INR
INR: 1.39 (ref 0.00–1.49)
Prothrombin Time: 16.7 seconds — ABNORMAL HIGH (ref 11.6–15.2)

## 2012-05-11 LAB — FERRITIN: Ferritin: 32 ng/mL (ref 22–322)

## 2012-05-11 LAB — CBC
HCT: 31.6 % — ABNORMAL LOW (ref 39.0–52.0)
Hemoglobin: 9.3 g/dL — ABNORMAL LOW (ref 13.0–17.0)
MCH: 24.9 pg — ABNORMAL LOW (ref 26.0–34.0)
MCHC: 29.4 g/dL — ABNORMAL LOW (ref 30.0–36.0)
MCV: 84.7 fL (ref 78.0–100.0)
Platelets: 112 10*3/uL — ABNORMAL LOW (ref 150–400)
RBC: 3.73 MIL/uL — ABNORMAL LOW (ref 4.22–5.81)
RDW: 19.2 % — ABNORMAL HIGH (ref 11.5–15.5)
WBC: 7.1 10*3/uL (ref 4.0–10.5)

## 2012-05-11 LAB — RETICULOCYTES
RBC.: 3.66 MIL/uL — ABNORMAL LOW (ref 4.22–5.81)
Retic Count, Absolute: 98.8 10*3/uL (ref 19.0–186.0)
Retic Ct Pct: 2.7 % (ref 0.4–3.1)

## 2012-05-11 LAB — T4, FREE: Free T4: 1.13 ng/dL (ref 0.80–1.80)

## 2012-05-11 LAB — HEMOGLOBIN A1C
Hgb A1c MFr Bld: 7.5 % — ABNORMAL HIGH (ref <5.7)
Mean Plasma Glucose: 169 mg/dL — ABNORMAL HIGH (ref <117)

## 2012-05-11 LAB — VITAMIN B12: Vitamin B-12: 1435 pg/mL — ABNORMAL HIGH (ref 211–911)

## 2012-05-11 LAB — TSH: TSH: 0.905 u[IU]/mL (ref 0.350–4.500)

## 2012-05-11 MED ORDER — INSULIN ASPART 100 UNIT/ML ~~LOC~~ SOLN
0.0000 [IU] | Freq: Three times a day (TID) | SUBCUTANEOUS | Status: DC
  Administered 2012-05-11 – 2012-05-12 (×2): 3 [IU] via SUBCUTANEOUS

## 2012-05-11 MED ORDER — POLYVINYL ALCOHOL 1.4 % OP SOLN
1.0000 [drp] | Freq: Two times a day (BID) | OPHTHALMIC | Status: DC
  Administered 2012-05-11: 2 [drp] via OPHTHALMIC
  Administered 2012-05-11 – 2012-05-12 (×2): 1 [drp] via OPHTHALMIC
  Filled 2012-05-11: qty 15

## 2012-05-11 MED ORDER — CITALOPRAM HYDROBROMIDE 20 MG PO TABS
40.0000 mg | ORAL_TABLET | Freq: Every day | ORAL | Status: DC
  Administered 2012-05-11: 40 mg via ORAL
  Filled 2012-05-11: qty 2

## 2012-05-11 MED ORDER — SIMVASTATIN 20 MG PO TABS
20.0000 mg | ORAL_TABLET | Freq: Every day | ORAL | Status: DC
  Administered 2012-05-11: 20 mg via ORAL
  Filled 2012-05-11: qty 1

## 2012-05-11 MED ORDER — BUSPIRONE HCL 5 MG PO TABS
15.0000 mg | ORAL_TABLET | Freq: Two times a day (BID) | ORAL | Status: DC
  Administered 2012-05-11 – 2012-05-12 (×2): 15 mg via ORAL
  Filled 2012-05-11 (×2): qty 3

## 2012-05-11 MED ORDER — INSULIN ASPART 100 UNIT/ML ~~LOC~~ SOLN: 0.0000 [IU] | Freq: Every day | SUBCUTANEOUS | Status: DC

## 2012-05-11 MED ORDER — FENTANYL 25 MCG/HR TD PT72
25.0000 ug | MEDICATED_PATCH | TRANSDERMAL | Status: DC
  Administered 2012-05-11: 25 ug via TRANSDERMAL
  Filled 2012-05-11: qty 1

## 2012-05-11 MED ORDER — SODIUM CHLORIDE 0.9 % IJ SOLN
INTRAMUSCULAR | Status: AC
  Filled 2012-05-11: qty 3

## 2012-05-11 MED ORDER — TAMSULOSIN HCL 0.4 MG PO CAPS
0.4000 mg | ORAL_CAPSULE | Freq: Every day | ORAL | Status: DC
  Administered 2012-05-11: 0.4 mg via ORAL
  Filled 2012-05-11: qty 1

## 2012-05-11 MED ORDER — SODIUM CHLORIDE 0.9 % IV SOLN
INTRAVENOUS | Status: DC
  Administered 2012-05-11: 1000 mL via INTRAVENOUS
  Administered 2012-05-11: 15:00:00 via INTRAVENOUS

## 2012-05-11 MED ORDER — INSULIN GLARGINE 100 UNIT/ML ~~LOC~~ SOLN
45.0000 [IU] | Freq: Every day | SUBCUTANEOUS | Status: DC
  Administered 2012-05-11: 45 [IU] via SUBCUTANEOUS

## 2012-05-11 MED ORDER — WARFARIN SODIUM 10 MG PO TABS
10.0000 mg | ORAL_TABLET | Freq: Once | ORAL | Status: AC
  Administered 2012-05-11: 10 mg via ORAL
  Filled 2012-05-11: qty 1

## 2012-05-11 NOTE — Progress Notes (Signed)
Pt refusing to comply with dietary restrictions. States that he is limited on what he eats due to teeth recently being pulled and personal preferences. Requested and given banana and yogurt for breakfast.

## 2012-05-11 NOTE — Progress Notes (Signed)
Pt placed on CPAP due sleep apnea, and O2 sat dropping to 80%. Pt difficult to arouse. After staff left room, patient took off mask and states " I don't need that thing." Currently awake, verbal teaching given, patient expressed understanding.

## 2012-05-11 NOTE — Progress Notes (Signed)
Patient place on CPAP with  1.5lpm bled in to maintain saturations around 88-92% per MD  Request.

## 2012-05-11 NOTE — Progress Notes (Addendum)
Subjective: The patient has no complaints of chest pain, shortness of breath, headache, nausea, or vomiting.  Objective: Vital signs in last 24 hours: Filed Vitals:   05/11/12 0400 05/11/12 0500 05/11/12 0600 05/11/12 0736  BP: 117/66 112/63 119/67   Pulse: 68 67 65   Temp: 98.4 F (36.9 C)   97.9 F (36.6 C)  TempSrc: Axillary   Axillary  Resp: 18 16 18    Height:      Weight:      SpO2: 89% 89% 94%     Intake/Output Summary (Last 24 hours) at 05/11/12 [id number] Last data filed at 05/11/12 0600  Gross per 24 hour  Intake   1210 ml  Output    800 ml  Net    410 ml    Weight change:   Physical exam: General: Morbidly obese 46 year old Caucasian man who is asleep, but easily arousable. Lungs: Clear anteriorly with decreased breath sounds in the bases. Heart: S1, S2, with no murmurs rubs or gallops. Abdomen: Positive bowel sounds, soft, obese, nontender, nondistended. Extremities: No pedal edema. Neurologic: He was initially asleep but became arousable. He is alert and oriented x3. Cranial nerves II through XII are intact.  Lab Results: Basic Metabolic Panel:  Basename 05/11/12 0510 05/10/12 1359  NA 139 135  K 4.0 4.9  CL 98 95*  CO2 34* 33*  GLUCOSE 129* 206*  BUN 31* 31*  CREATININE 1.84* 1.83*  CALCIUM 8.4 8.5  MG -- --  PHOS -- --   Liver Function Tests:  Basename 05/11/12 0510  AST 26  ALT 22  ALKPHOS 93  BILITOT 0.5  PROT 7.2  ALBUMIN 2.5*   No results found for this basename: LIPASE:2,AMYLASE:2 in the last 72 hours No results found for this basename: AMMONIA:2 in the last 72 hours CBC:  Basename 05/11/12 0510 05/10/12 1359  WBC 7.1 11.3*  NEUTROABS -- 8.9*  HGB 9.3* 10.0*  HCT 31.6* 33.7*  MCV 84.7 84.7  PLT 112* 141*   Cardiac Enzymes:  Basename 05/10/12 1359  CKTOTAL --  CKMB --  CKMBINDEX --  TROPONINI <0.30   BNP: No results found for this basename: PROBNP:3 in the last 72 hours D-Dimer: No results found for this basename:  DDIMER:2 in the last 72 hours CBG:  Basename 05/11/12 0731 05/10/12 2133 05/10/12 0743 05/09/12 2151 05/09/12 0604  GLUCAP 135* 209* 259* 184* 163*   Hemoglobin A1C: No results found for this basename: HGBA1C in the last 72 hours Fasting Lipid Panel: No results found for this basename: CHOL,HDL,LDLCALC,TRIG,CHOLHDL,LDLDIRECT in the last 72 hours Thyroid Function Tests: No results found for this basename: TSH,T4TOTAL,FREET4,T3FREE,THYROIDAB in the last 72 hours Anemia Panel: No results found for this basename: VITAMINB12,FOLATE,FERRITIN,TIBC,IRON,RETICCTPCT in the last 72 hours Coagulation:  Basename 05/11/12 0510 05/10/12 1359  LABPROT 16.7* 16.4*  INR 1.39 1.35   Urine Drug Screen: Drugs of Abuse  No results found for this basename: labopia, cocainscrnur, labbenz, amphetmu, thcu, labbarb    Alcohol Level: No results found for this basename: ETH:2 in the last 72 hours Urinalysis: No results found for this basename: COLORURINE:2,APPERANCEUR:2,LABSPEC:2,PHURINE:2,GLUCOSEU:2,HGBUR:2,BILIRUBINUR:2,KETONESUR:2,PROTEINUR:2,UROBILINOGEN:2,NITRITE:2,LEUKOCYTESUR:2 in the last 72 hours Misc. Labs:   Micro: Recent Results (from the past 240 hour(s))  SURGICAL PCR SCREEN     Status: Normal   Collection Time   05/09/12 11:31 AM      Component Value Range Status Comment   MRSA, PCR NEGATIVE  NEGATIVE Final    Staphylococcus aureus NEGATIVE  NEGATIVE Final   MRSA PCR SCREENING  Status: Normal   Collection Time   05/10/12  8:01 PM      Component Value Range Status Comment   MRSA by PCR NEGATIVE  NEGATIVE Final     Studies/Results: Dg Chest Portable 1 View  05/10/2012  *RADIOLOGY REPORT*  Clinical Data: Shortness of breath, decreased oxygen saturation during anesthesia for dental work 1 day ago  PORTABLE CHEST - 1 VIEW  Comparison: Portable exam 1358 hours compared to 04/26/2012  Findings: Enlargement of cardiac silhouette. Pulmonary vascularity normal. Low lung volumes with  chronic elevation of right diaphragm. Minimal right basilar atelectasis. No infiltrate, pleural effusion or pneumothorax. No acute osseous findings.  IMPRESSION: Mild enlargement of cardiac silhouette. Low lung volumes with minimal right basilar atelectasis.   Original Report Authenticated By: Lavonia Dana, M.D.     Medications:  Scheduled:   . [COMPLETED] albuterol  5 mg Nebulization Once  . clindamycin  300 mg Oral TID  . enoxaparin (LOVENOX) injection  100 mg Subcutaneous Q12H   And  . enoxaparin (LOVENOX) injection  110 mg Subcutaneous Q12H  . fluticasone  1 spray Each Nare BID  . furosemide  60 mg Oral Daily  . influenza  inactive virus vaccine  0.5 mL Intramuscular Tomorrow-1000  . insulin aspart  0-15 Units Subcutaneous TID WC  . insulin aspart  0-5 Units Subcutaneous QHS  . insulin glargine  35 Units Subcutaneous QHS  . [COMPLETED] ipratropium  0.5 mg Nebulization Once  . lamoTRIgine  200 mg Oral BID  . levETIRAcetam  500 mg Oral BID  . [COMPLETED] naloxone  1 mg Intravenous Once  . pantoprazole (PROTONIX) IV  40 mg Intravenous Q1200  . polyvinyl alcohol  1-2 drop Both Eyes BID  . [COMPLETED] sodium chloride  1,000 mL Intravenous Once  . [COMPLETED] sodium chloride  500 mL Intravenous Once  . sodium chloride  3 mL Intravenous Q12H  . sodium chloride  3 mL Intravenous Q12H  . [COMPLETED] warfarin  10 mg Oral NOW  . Warfarin - Pharmacist Dosing Inpatient   Does not apply q1800  . [DISCONTINUED] albuterol  5 mg Nebulization Once  . [DISCONTINUED] hydroxypropyl methylcellulose  1-2 drop Both Eyes BID  . [DISCONTINUED] ipratropium  0.5 mg Nebulization Once  . [DISCONTINUED] warfarin  10 mg Oral ONCE-1800   Continuous:   . sodium chloride 100 mL/hr at 05/11/12 0600   [id number], acetaminophen, albuterol, ondansetron (ZOFRAN) IV, ondansetron, sodium chloride  Assessment: Principal Problem:  *Acute respiratory failure Active Problems:  Hypoxia  Morbid obesity  OSA  (obstructive sleep apnea)  Obesity hypoventilation syndrome  DM2 (diabetes mellitus, type 2)  History of pulmonary embolism  Chronic anticoagulation  Seizure disorder  Anemia  Thrombocytopenia  Acute renal failure   1. Acute (on chronic) respiratory failure with hypoxia and hypercapnia. Given the patient's body habitus and obstructive sleep apnea, it is likely he has chronic hypoxic and hypercapnic respiratory failure. It appears that hypoxia is worse than hypercapnia. According to his fiance, his oxygen saturations are usually in the 80s. The worsening hypoxia can be attributed to recent anesthesia concomitant with chronic opiate use/medications used to treat pain, obstructive sleep apnea, and obesity hypoventilation syndrome. PE and seizure are possibilities, but less likely.  Obstructive sleep apnea. He had been prescribed CPAP at home, but had been noncompliant.   History of DVT/PE. Coumadin was recently withheld due to dental surgery. Lovenox has been started given that his INR is subtherapeutic.  Seizure disorder. Currently stable on Lamictal and Keppra.  Morbid  obesity. The patient needs weight loss and likely bariatric surgery.  Diabetes mellitus, type II. Stable. We'll continue current medications.  Anemia and thrombocytopenia. Etiology unknown at this time. We'll investigate further. We'll continue Protonix empirically.  Acute renal failure. Apparently his baseline creatinine is between 1.1 and 1.2. It is 1.8. We'll continue IV fluids for hydration and monitor his urine output. Consider a renal ultrasound of his creatinine does not improve with IV fluid hydration.   Recent dental surgery. We'll continue clindamycin.  Chronic pain syndrome (chronic bilateral lower extremity pain). He is chronically treated with fentanyl Duragesic 75 mcg every 3 days, oxycodone, Lyrica, and Flexeril.  Plan:  1. We'll titrate down oxygen to keep his oxygen saturations in the mid 80s to low  90s. It is likely that his oxygen saturation at baseline is in the mid 80s. It is likely that he will require some element of home oxygen. We'll check an ABG later this afternoon. 2. Restart CPAP each bedtime. Discontinue BiPAP. The patient was instructed to become more compliant with CPAP at home. 3. Restart fentanyl at a lower dose. 4. Continue Lovenox bridging and Coumadin. 5. Continue IV fluids. 6. We'll order an anemia panel and TSH/free T4.    Total time: 45 minutes.   LOS: 1 day   Nancee Brownrigg 05/11/2012, 8:07 AM

## 2012-05-11 NOTE — Progress Notes (Signed)
Pt had a small amount of bleeding from nose this morning, which stopped quickly. Water bottle was then attached to O2 to prevent drying of mucosa. Will monitor for additional bleeding, as patient is on anticoagulants.

## 2012-05-11 NOTE — Progress Notes (Signed)
CRITICAL VALUE ALERT  Critical value received:  PC02-63.3    Date of notification:  05/11/12  Time of notification:  0610  Critical value read back:yes  Nurse who received alert:  Lucy Antigua, RN  MD notified (1st page):  Dr. Bonnielee Haff  Time of first page:  [phone]  MD notified (2nd page):  Time of second page:  Responding MD:     Time MD responded:

## 2012-05-11 NOTE — Progress Notes (Signed)
ANTICOAGULATION CONSULT NOTE   Pharmacy Consult for Lovenox & Warfarin Indication: pulmonary embolus and DVT  Allergies  Allergen Reactions  . Bee Venom Shortness Of Breath and Swelling  . Penicillins Shortness Of Breath  . Iohexol      Code: RASH, Desc: PT WAS ON PREDNISONE FOR GOUT TX. @ TIME OF SCAN AND RECEIVED 50 MG OF BENADRYL IV-ARS 10/08/07   . Shellfish Allergy Nausea And Vomiting and Other (See Comments)    Feels like insides are twisting  . Iodine Rash  . Latex Rash    Patient Measurements: Height: 6\' 3"  (190.5 cm) Weight: 467 lb (211.83 kg) IBW/kg (Calculated) : 84.5   Vital Signs: Temp: 97.9 F (36.6 C) (11/13 0736) Temp src: Axillary (11/13 0736) BP: 119/64 mmHg (11/13 0900) Pulse Rate: 66  (11/13 0900)  Labs:  Basename 05/11/12 0510 05/10/12 1359 05/09/12 0637  HGB 9.3* 10.0* --  HCT 31.6* 33.7* --  PLT 112* 141* --  APTT -- -- 40*  LABPROT 16.7* 16.4* 18.0*  INR 1.39 1.35 1.54*  HEPARINUNFRC -- -- --  CREATININE 1.84* 1.83* --  CKTOTAL -- -- --  CKMB -- -- --  TROPONINI -- <0.30 --    Estimated Creatinine Clearance: 97.1 ml/min (by C-G formula based on Cr of 1.84).   Medical History: Past Medical History  Diagnosis Date  . Sleep apnea     CPAP  . Seizures   . Migraine   . Anxiety   . Mental disorder   . Bipolar 1 disorder   . Bronchitis     hx of  . Dyspnea     with ambulation  . Diabetes mellitus without complication   . Neuromuscular disorder   . Diabetic neuropathy   . Arthritis   . GERD (gastroesophageal reflux disease)   . Anemia   . Bruises easily   . Chronic kidney disease     decreased left kidney fx  . DVT (deep venous thrombosis)     LLE DVT ~ '12  . PE (pulmonary embolism)     bilateral PE ~ '11  . Thrombocytopenia 05/11/2012  . Chronic pain syndrome 05/11/2012   Medications:  Medications Prior to Admission  Medication Sig Dispense Refill  . busPIRone (BUSPAR) 15 MG tablet Take 15 mg by mouth 2 (two) times  daily.      . Canagliflozin (INVOKANA) 100 MG TABS Take 100 mg by mouth daily.      . citalopram (CELEXA) 40 MG tablet Take 60 mg by mouth at bedtime.      . cyclobenzaprine (FLEXERIL) 5 MG tablet Take 5 mg by mouth 2 (two) times daily as needed. For muscle spasms      . docusate sodium (COLACE) 100 MG capsule Take 100 mg by mouth 2 (two) times daily.      . Fe Fum-FA-B Cmp-C-Zn-Mg-Mn-Cu (HEMOCYTE PLUS PO) Take 1 tablet by mouth daily.      . fentaNYL (DURAGESIC - DOSED MCG/HR) 75 MCG/HR Place 1 patch onto the skin every 3 (three) days.      . fluticasone (FLONASE) 50 MCG/ACT nasal spray Place 1 spray into the nose 2 (two) times daily.      . furosemide (LASIX) 40 MG tablet Take 60 mg by mouth daily.      . hydroxypropyl methylcellulose (ISOPTO TEARS) 2.5 % ophthalmic solution Place 1-2 drops into both eyes 2 (two) times daily.      . insulin aspart (NOVOLOG) 100 UNIT/ML injection Inject 2-12 Units into the  skin 4 (four) times daily -  before meals and at bedtime.      . insulin glargine (LANTUS) 100 UNIT/ML injection Inject 110 Units into the skin at bedtime.      . lamoTRIgine (LAMICTAL) 200 MG tablet Take 200 mg by mouth 2 (two) times daily.      Marland Kitchen levETIRAcetam (KEPPRA) 500 MG tablet Take 500 mg by mouth 2 (two) times daily.      . Multiple Vitamin (MULTIVITAMIN WITH MINERALS) TABS Take 1 tablet by mouth daily.      Marland Kitchen omeprazole (PRILOSEC) 20 MG capsule Take 20 mg by mouth 2 (two) times daily.      Marland Kitchen oxyCODONE-acetaminophen (PERCOCET) 5-325 MG per tablet Take 1-2 tablets by mouth every 4 (four) hours as needed for pain.  40 tablet  0  . potassium chloride (K-DUR) 10 MEQ tablet Take 30 mEq by mouth daily.      . pregabalin (LYRICA) 50 MG capsule Take 50 mg by mouth 3 (three) times daily.      . simvastatin (ZOCOR) 20 MG tablet Take 20 mg by mouth at bedtime.      . Tamsulosin HCl (FLOMAX) 0.4 MG CAPS Take 0.4 mg by mouth at bedtime.      Marland Kitchen warfarin (COUMADIN) 5 MG tablet Take 7.5-10 mg by  mouth daily. Take 1.5 tab every day except thursdays take 2 tabs      . zolpidem (AMBIEN) 5 MG tablet Take 2.5 mg by mouth at bedtime as needed. For sleep      . clindamycin (CLEOCIN) 300 MG capsule Take 1 capsule (300 mg total) by mouth 3 (three) times daily.  21 capsule  0   Assessment: Okay for Protocol Recent Dental Extraction Procedure on (11/11). Patient with a history of DVT and PE on Warfarin at home but sub therapeutic INR.  Home dose as above.  Goal of Therapy:  Anti-Xa level 0.6-1.2 units/ml 4hrs after LMWH dose given Monitor platelets by anticoagulation protocol: Yes INR goal 2-3   Plan:  Lovenox 1mg /kg SQ every 12 hours. CBC monitoring. Consider Anti-Xa level at steady state due to obesity. Warfarin 10mg  PO x 1. Daily INR  [place] Crane, Zackaria Burkey A 05/11/2012,10:20 AM

## 2012-05-11 NOTE — Progress Notes (Signed)
CRITICAL VALUE ALERT  Critical value received:  CO2 59 Date of notification:  05-11-12  Time of notification:  [id number]  Critical value read back: yes  Nurse who received alert:  Jadene Pierini RN  MD notified (1st page):  1049  Time of first page:  1050  MD notified (2nd page):  Time of second page:  Responding MD:  Dr Fuller Canada  Time MD responded:

## 2012-05-11 NOTE — Progress Notes (Signed)
Inpatient Diabetes Program Recommendations  AACE/ADA: New Consensus Statement on Inpatient Glycemic Control (2013)  Target Ranges:  Prepandial:   less than 140 mg/dL      Peak postprandial:   less than 180 mg/dL (1-2 hours)      Critically ill patients:  140 - 180 mg/dL   Results for JOHANSEN, GARY (MRN [id number]) as of 05/11/2012 12:50  Ref. Range 05/09/2012 21:51 05/10/2012 07:43 05/10/2012 21:33 05/11/2012 07:31 05/11/2012 11:17  Glucose-Capillary Latest Range: 70-99 mg/dL 184 (H) 259 (H) 209 (H) 135 (H) 192 (H)    Inpatient Diabetes Program Recommendations Insulin - Basal: Please consider increasing Lantus to 45 units QHS. Correction (SSI): Please consider increasing correction scale to resistant. HgbA1C: Please consider ordering an A1C to evaluate average glucose over the past 2-3 months.  Note: Will continue to follow.  Thanks, Barnie Alderman, RN, BSN, [place] [facility] Coordinator Inpatient Diabetes Program [phone]

## 2012-05-11 NOTE — Progress Notes (Signed)
Pt removed c-pap because he said it made him feel anxious. He now has on nasal canula at 1.5 liters. Will let MD and respiratory know.

## 2012-05-11 NOTE — Progress Notes (Signed)
UR Chart Review Completed  

## 2012-05-11 NOTE — Progress Notes (Signed)
Nutrition Follow-up  Intervention:  Met with pt and nutrition services ambassador to help pt  With planning meals and understanding available food choices for CHO Mod diet compliance.  Assessment:   Pt and spouse present with nutrition services ambassador. Pt recently had teeth extracted and mouth is sore. Meals planned according to his preferences and ability to consume them while remaining within diet order compliance.  Diet Order: CHO Mod (1600-2000 kcal)   Meds: Scheduled Meds:   . [COMPLETED] albuterol  5 mg Nebulization Once  . clindamycin  300 mg Oral TID  . enoxaparin (LOVENOX) injection  100 mg Subcutaneous Q12H   And  . enoxaparin (LOVENOX) injection  110 mg Subcutaneous Q12H  . fentaNYL  25 mcg Transdermal Q72H  . fluticasone  1 spray Each Nare BID  . furosemide  60 mg Oral Daily  . influenza  inactive virus vaccine  0.5 mL Intramuscular Tomorrow-1000  . insulin aspart  0-15 Units Subcutaneous TID WC  . insulin aspart  0-5 Units Subcutaneous QHS  . insulin glargine  35 Units Subcutaneous QHS  . [COMPLETED] ipratropium  0.5 mg Nebulization Once  . lamoTRIgine  200 mg Oral BID  . levETIRAcetam  500 mg Oral BID  . [COMPLETED] naloxone  1 mg Intravenous Once  . pantoprazole (PROTONIX) IV  40 mg Intravenous Q1200  . polyvinyl alcohol  1-2 drop Both Eyes BID  . simvastatin  20 mg Oral q1800  . [COMPLETED] sodium chloride  1,000 mL Intravenous Once  . [COMPLETED] sodium chloride  500 mL Intravenous Once  . sodium chloride  3 mL Intravenous Q12H  . sodium chloride  3 mL Intravenous Q12H  . Tamsulosin HCl  0.4 mg Oral QPC supper  . [COMPLETED] warfarin  10 mg Oral NOW  . Warfarin - Pharmacist Dosing Inpatient   Does not apply q1800  . [DISCONTINUED] albuterol  5 mg Nebulization Once  . [DISCONTINUED] hydroxypropyl methylcellulose  1-2 drop Both Eyes BID  . [DISCONTINUED] ipratropium  0.5 mg Nebulization Once  . [DISCONTINUED] warfarin  10 mg Oral ONCE-1800   Continuous  Infusions:   . [EXPIRED] sodium chloride 100 mL/hr at 05/11/12 0900   PRN Meds:.acetaminophen, acetaminophen, albuterol, ondansetron (ZOFRAN) IV, ondansetron, sodium chloride   CMP     Component Value Date/Time   NA 139 05/11/2012 0510   K 4.0 05/11/2012 0510   CL 98 05/11/2012 0510   CO2 34* 05/11/2012 0510   GLUCOSE 129* 05/11/2012 0510   BUN 31* 05/11/2012 0510   CREATININE 1.84* 05/11/2012 0510   CALCIUM 8.4 05/11/2012 0510   PROT 7.2 05/11/2012 0510   ALBUMIN 2.5* 05/11/2012 0510   AST 26 05/11/2012 0510   ALT 22 05/11/2012 0510   ALKPHOS 93 05/11/2012 0510   BILITOT 0.5 05/11/2012 0510   GFRNONAA 43* 05/11/2012 0510   GFRAA 49* 05/11/2012 0510    CBG (last 3)   Basename 05/11/12 0731 05/10/12 2133 05/10/12 0743  GLUCAP 135* 209* 259*      Intake/Output Summary (Last 24 hours) at 05/11/12 1008 Last data filed at 05/11/12 0900  Gross per 24 hour  Intake   1510 ml  Output    800 ml  Net    710 ml    Weight Status: 467#  (212 kg) Meets criteria for Obesity Class III   [id number]

## 2012-05-11 NOTE — Plan of Care (Signed)
Problem: ICU Phase Progression Outcomes Goal: Voiding-avoid urinary catheter unless indicated Outcome: Progressing Using urinal

## 2012-05-11 NOTE — Progress Notes (Addendum)
Per respiratory, patient refused to wear Bipap and would only consent to wearing a C-pap during the night. He wore the C-pap from 2215 until approximately 0615. At this time, the patient is refusing to wear the C-pap and is now on the Venti mask at 40%.

## 2012-05-12 ENCOUNTER — Encounter (HOSPITAL_COMMUNITY): Payer: Self-pay | Admitting: Internal Medicine

## 2012-05-12 DIAGNOSIS — E119 Type 2 diabetes mellitus without complications: Secondary | ICD-10-CM

## 2012-05-12 DIAGNOSIS — R4182 Altered mental status, unspecified: Secondary | ICD-10-CM | POA: Diagnosis present

## 2012-05-12 DIAGNOSIS — D649 Anemia, unspecified: Secondary | ICD-10-CM

## 2012-05-12 DIAGNOSIS — E662 Morbid (severe) obesity with alveolar hypoventilation: Secondary | ICD-10-CM

## 2012-05-12 LAB — COMPREHENSIVE METABOLIC PANEL
ALT: 20 U/L (ref 0–53)
AST: 24 U/L (ref 0–37)
Albumin: 2.4 g/dL — ABNORMAL LOW (ref 3.5–5.2)
Alkaline Phosphatase: 88 U/L (ref 39–117)
BUN: 27 mg/dL — ABNORMAL HIGH (ref 6–23)
CO2: 32 mEq/L (ref 19–32)
Calcium: 8.4 mg/dL (ref 8.4–10.5)
Chloride: 101 mEq/L (ref 96–112)
Creatinine, Ser: 1.58 mg/dL — ABNORMAL HIGH (ref 0.50–1.35)
GFR calc Af Amer: 59 mL/min — ABNORMAL LOW (ref >90)
GFR calc non Af Amer: 51 mL/min — ABNORMAL LOW (ref >90)
Glucose, Bld: 135 mg/dL — ABNORMAL HIGH (ref 70–99)
Potassium: 3.7 mEq/L (ref 3.5–5.1)
Sodium: 139 mEq/L (ref 135–145)
Total Bilirubin: 0.5 mg/dL (ref 0.3–1.2)
Total Protein: 6.9 g/dL (ref 6.0–8.3)

## 2012-05-12 LAB — GLUCOSE, CAPILLARY: Glucose-Capillary: 131 mg/dL — ABNORMAL HIGH (ref 70–99)

## 2012-05-12 LAB — CBC
HCT: 28.4 % — ABNORMAL LOW (ref 39.0–52.0)
Hemoglobin: 8.6 g/dL — ABNORMAL LOW (ref 13.0–17.0)
MCH: 25.3 pg — ABNORMAL LOW (ref 26.0–34.0)
MCHC: 30.3 g/dL (ref 30.0–36.0)
MCV: 83.5 fL (ref 78.0–100.0)
Platelets: 102 10*3/uL — ABNORMAL LOW (ref 150–400)
RBC: 3.4 MIL/uL — ABNORMAL LOW (ref 4.22–5.81)
RDW: 19.1 % — ABNORMAL HIGH (ref 11.5–15.5)
WBC: 4.4 10*3/uL (ref 4.0–10.5)

## 2012-05-12 LAB — PROTIME-INR
INR: 1.61 — ABNORMAL HIGH (ref 0.00–1.49)
Prothrombin Time: 18.6 seconds — ABNORMAL HIGH (ref 11.6–15.2)

## 2012-05-12 MED ORDER — ENOXAPARIN SODIUM 100 MG/ML ~~LOC~~ SOLN: 100.0000 mg | Freq: Two times a day (BID) | SUBCUTANEOUS | Status: DC

## 2012-05-12 MED ORDER — WARFARIN SODIUM 10 MG PO TABS: 10.0000 mg | ORAL_TABLET | Freq: Once | ORAL | Status: DC

## 2012-05-12 MED ORDER — SIMETHICONE 80 MG PO CHEW
80.0000 mg | CHEWABLE_TABLET | Freq: Four times a day (QID) | ORAL | Status: DC | PRN
  Administered 2012-05-12: 80 mg via ORAL
  Filled 2012-05-12: qty 1

## 2012-05-12 MED ORDER — FUROSEMIDE 40 MG PO TABS: 60.0000 mg | ORAL_TABLET | Freq: Every day | ORAL | Status: DC

## 2012-05-12 MED ORDER — FENTANYL 25 MCG/HR TD PT72: 1.0000 | MEDICATED_PATCH | TRANSDERMAL | Status: DC

## 2012-05-12 NOTE — Progress Notes (Signed)
Pt states he has not worn CPAP at home in 6 years. Pt encouraged to wear it, as order to prevent cardiac complications. Pt is also non compliant with diet. Requested regular soda, when staff told patient we could only give him diet due to his diabetes, he had his significant other get regular soda from the vending machine. Pt also is eating food brought in from outside restaurants, along with his hospital food. Pt taught about the risk of kidney failure due to uncontrolled blood sugars. He remains disinterested in learning and complying.

## 2012-05-12 NOTE — Discharge Summary (Signed)
Physician Discharge Summary  [name] [id number] DOB: Oct 22, 1965 DOA: 05/10/2012  PCP: Guss Bunde, PA  Admit date: 05/10/2012 Discharge date: 05/12/2012  Time spent: Greater than 30 minutes  Recommendations for Outpatient Follow-up:  1. The patient was discharged on Lovenox bridging. He has an appointment to followup with his primary care provider next week. 2. The patient needs a referral for further evaluation of his chronic anemia. 3. Recommend referral to a bariatric surgeon for treatment of the patient's morbid obesity. 4. The patient was discharged on home oxygen (new). He was encouraged to wear his CPAP nightly.  Discharge Diagnoses:  1. Altered mental status, etiology multifactorial including acute respiratory failure, medication side effect, and morbid obesity et [name].. 2. Acute hypoxic and hypercapnic respiratory failure (the patient likely has chronic hypoxia and chronic hypercapnia and therefore, he has chronic hypoxic and hypercapnic respiratory failure).     ----The patient's ABG on room air on admission revealed a pH of 7.35, PCO2 of 56, and PO2 of 37. Followup ABG on 3 L of nasal cannula oxygen reveal a pH of 7.36, PCO2 of 59, and PO2 of 65.     ----Oxygen saturation on room air at the time of discharge was 87%. 3. Obesity hypoventilation syndrome. 4. Obstructive sleep apnea, noncompliant with CPAP. 5. Morbid obesity. The patient's weight on admission was 467 pounds. 6. History of PE and DVT, on chronic Coumadin. 7. Chronic pain syndrome, on chronic opiates and other analgesics. 8. Anemia. Further outpatient evaluation and referrals recommended. The patient's hemoglobin was 8.6 at the time of discharge. His anemia panel revealed a total iron of 35, TIBC of 271, ferritin of 32, and vitamin B12 of 1435. 9. Thrombocytopenia of unknown etiology.  10. Acute renal failure, thought to be secondary to mild dehydration/volume depletion. His creatinine was 1.58 at  the time of discharge. Baseline creatinine is 1.2. 11. Type 2 diabetes mellitus. His hemoglobin A1c was 7.5. 12. Seizure disorder. Stable. 13. Recent dental surgery.        Discharge Condition: Improved.  Diet recommendation: heart healthy and carbohydrate modified.  Filed Weights   05/10/12 1345 05/10/12 2025 05/10/12 2035  Weight: 211.83 kg (467 lb) 211.83 kg (467 lb) 211.83 kg (467 lb)    History of present illness:  The patient is a 46 year old man with a history significant for morbid obesity, pulmonary embolism, diabetes mellitus, and seizure disorder, who presented to the emergency department on 05/10/2012 for altered mental status and low oxygen levels. The patient had recently underwent extraction of his lower dentition on May 09, 2012. This occurred at [facility]. He was kept overnight but was discharged the following morning. According to the history provided, the patient's oxygen saturations were very low, nevertheless, he was discharged home and instructed to followup with his primary care provider. When he presented to his primary care physician, he was instructed to come to [facility] for further evaluation. His fianc reported that the patient became significantly drowsy and was having difficulty staying awake.  In the emergency department, he was afebrile and hemodynamically stable. His chest x-ray revealed mild enlargement of the cardiac silhouette and low lung volumes with minimal right basilar atelectasis. His ABG on room air revealed a pH of 7.35, PCO2 of 56, and PO2 of 37. He was placed on BiPAP and admitted for further evaluation and management.  Hospital Course:  The patient was admitted to the ICU on BiPAP. However, he did not tolerated and requested that it be  taken off. Because of his significant hypoxia, oxygen was given via a face mask and then subsequently by nasal cannula. It was believed that his acute hypoxic and hypercapnic respiratory  failure was likely the consequence of a combination of anesthesia from the dental surgery, fentanyl Duragesic, and other psychotropic medication such as Lyrica, Flexeril, and oxycodone. This is coupled with the patient's morbid obesity, obesity hypoventilation syndrome, and obstructive sleep apnea with noncompliance with CPAP. His INR was subtherapeutic, but it was felt that his hypoxia was not likely secondary to a recurrent PE. He has a seizure disorder, but there was no witnessed seizure by his family, and therefore, it was less likely that he was post ictal.  All of his psychotropic medications were withheld. The fentanyl patch was taken off. He was started on IV fluids for hydration as it was felt that he was dehydrated. His lab data revealed acute renal failure with a creatinine higher than his baseline creatinine. His diabetes was treated with sliding scale NovoLog. He was maintained on clindamycin as prescribed by his dental surgeon. Full dose Lovenox was started as a bridge in light of his subtherapeutic INR.  The patient became more alert and oriented. Because of pain, as needed oxycodone at a lower dose was started. Transdermal Duragesic was started at 25 mcg instead of 75 mcg which he had been treated with previously. CPAP was attempted. The patient kept it on for an hour, but could not tolerate it. His followup ABG on 3 L of oxygen revealed improvement in his oxygen levels and CO2 levels. Prior to discharge, on room air, his oxygen saturations were in the 85-87% range. Therefore, home oxygen was ordered.  The patient's renal function improved as his creatinine decreased from 1.8-1.58. He was hydrated with IV fluids throughout the hospitalization. He was maintained on Lasix during IV fluid hydration. At the time of discharge, however, he was instructed to hold and not take the Lasix for couple days and to drink more fluids. He was instructed to restart Lasix after holding it for 2 days. He voiced  understanding.  His diabetes was well controlled on the insulin regimen provided during the hospital stay. He was instructed to continue his usual home dosing of insulin and other diabetes medications. His hemoglobin A1c was 7.5.  For his morbid obesity, he was advised to try to lose weight the best he could. I recommended that he discuss a referral to a bariatric surgeon to treat and ultimately cure his obstructive sleep apnea and obesity.  The patient's hemoglobin fell to 8.6, in part, because of the dilutional effects of the IV fluids. An anemia panel was ordered. The results were dictated above. He would benefit from an upper endoscopy and colonoscopy, but admittedly, these procedures will be problematic in such an obese patient. He was also noted to be thrombocytopenic with a platelet count ranging from 102-140. His vitamin B12 was assessed and it was actually elevated. His TSH was assessed and it was within normal limits. Further evaluation will be deferred to his primary care providers.  The patient's INR improved to 1.61, but was still obviously subtherapeutic. Therefore, he was discharged on bridging Lovenox.  He was alert and oriented at the time of discharge. He was hemodynamically stable. He will followup with his primary care provider as scheduled.  Procedures:  None   Consultations:  None  Discharge Exam: Filed Vitals:   05/12/12 0800 05/12/12 0845 05/12/12 0900 05/12/12 0933  BP: 131/61  139/86  Pulse: 67 64 70   Temp:      TempSrc:      Resp: 23 17 20    Height:      Weight:      SpO2: 92% 87% 92% 93%    General: alert morbidly obese 46 year old Caucasian man, in no acute distress. Cardiovascular: S1, S2, with no murmurs rubs or gallops. Respiratory: clear to auscultation bilaterally.  Discharge Instructions  Discharge Orders    Future Orders Please Complete By Expires   Diet - low sodium heart healthy      Diet Carb Modified      Increase activity slowly       Discharge instructions      Comments:   Give yourself Lovenox every 12 hours as prescribed until your INR is between 2-3. Wear oxygen at 2 L per minute as close to 24 hours daily as possible. Wear your CPAP every night at bedtime. Discuss with your primary care provider a referral for bariatric surgery and a referral for gastroenterology for workup of your anemia. Hold Lasix for 2 days and restart. Increase your intake of fluids over the next couple days.       Medication List     As of 05/12/2012  6:30 PM    STOP taking these medications         cyclobenzaprine 5 MG tablet   Commonly known as: FLEXERIL      fentaNYL 75 MCG/HR   Commonly known as: DURAGESIC - dosed mcg/hr      TAKE these medications         busPIRone 15 MG tablet   Commonly known as: BUSPAR   Take 15 mg by mouth 2 (two) times daily.      citalopram 40 MG tablet   Commonly known as: CELEXA   Take 60 mg by mouth at bedtime.      clindamycin 300 MG capsule   Commonly known as: CLEOCIN   Take 1 capsule (300 mg total) by mouth 3 (three) times daily.      docusate sodium 100 MG capsule   Commonly known as: COLACE   Take 100 mg by mouth 2 (two) times daily.      enoxaparin 100 MG/ML injection   Commonly known as: LOVENOX   Inject 1 mL (100 mg total) into the skin every 12 (twelve) hours.      enoxaparin 100 MG/ML injection   Commonly known as: LOVENOX   Inject 1 mL (100 mg total) into the skin every 12 (twelve) hours.      fentaNYL 25 MCG/HR   Commonly known as: DURAGESIC - dosed mcg/hr   Place 1 patch (25 mcg total) onto the skin every 3 (three) days.      fluticasone 50 MCG/ACT nasal spray   Commonly known as: FLONASE   Place 1 spray into the nose 2 (two) times daily.      furosemide 40 MG tablet   Commonly known as: LASIX   Take 1.5 tablets (60 mg total) by mouth daily. Hold for 2 days and restart taking on 05/15/2012.      HEMOCYTE PLUS PO   Take 1 tablet by mouth daily.       hydroxypropyl methylcellulose 2.5 % ophthalmic solution   Commonly known as: ISOPTO TEARS   Place 1-2 drops into both eyes 2 (two) times daily.      insulin aspart 100 UNIT/ML injection   Commonly known as: novoLOG   Inject 2-12 Units into the  skin 4 (four) times daily -  before meals and at bedtime.      insulin glargine 100 UNIT/ML injection   Commonly known as: LANTUS   Inject 110 Units into the skin at bedtime.      INVOKANA 100 MG Tabs   Generic drug: Canagliflozin   Take 100 mg by mouth daily.      lamoTRIgine 200 MG tablet   Commonly known as: LAMICTAL   Take 200 mg by mouth 2 (two) times daily.      levETIRAcetam 500 MG tablet   Commonly known as: KEPPRA   Take 500 mg by mouth 2 (two) times daily.      multivitamin with minerals Tabs   Take 1 tablet by mouth daily.      omeprazole 20 MG capsule   Commonly known as: PRILOSEC   Take 20 mg by mouth 2 (two) times daily.      oxyCODONE-acetaminophen 5-325 MG per tablet   Commonly known as: PERCOCET/ROXICET   Take 1-2 tablets by mouth every 4 (four) hours as needed for pain.      potassium chloride 10 MEQ tablet   Commonly known as: K-DUR   Take 30 mEq by mouth daily.      pregabalin 50 MG capsule   Commonly known as: LYRICA   Take 50 mg by mouth 3 (three) times daily.      simvastatin 20 MG tablet   Commonly known as: ZOCOR   Take 20 mg by mouth at bedtime.      Tamsulosin HCl 0.4 MG Caps   Commonly known as: FLOMAX   Take 0.4 mg by mouth at bedtime.      warfarin 5 MG tablet   Commonly known as: COUMADIN   Take 7.5-10 mg by mouth daily. Take 1.5 tab every day except thursdays take 2 tabs      zolpidem 5 MG tablet   Commonly known as: AMBIEN   Take 2.5 mg by mouth at bedtime as needed. For sleep           Follow-up Information    Follow up with [facility]. In 1 week.   Contact information:   Office will call you to give appointment date and time. If they have not called you  by 05/13/13 please call the office@ [phone].          The results of significant diagnostics from this hospitalization (including imaging, microbiology, ancillary and laboratory) are listed below for reference.    Significant Diagnostic Studies: Dg Chest 2 View  04/26/2012  *RADIOLOGY REPORT*  Clinical Data: Preop, shortness of breath  CHEST - 2 VIEW  Comparison: 10/21/2010  Findings: Study is suboptimal due to patient body habitus.  Low lung volumes. No acute infiltrate or pulmonary edema.  Mild basilar atelectasis. Mild degenerative changes thoracic spine.  IMPRESSION: No active disease.  Suboptimal study as described above.   Original Report Authenticated By: Lahoma Crocker, M.D.    Dg Hip Bilateral W/pelvis  04/26/2012  *RADIOLOGY REPORT*  Clinical Data: Bilateral hip pain.  BILATERAL HIP WITH PELVIS - 4+ VIEW  Comparison: None  Findings: Both hips are normally located.  There are mild symmetric hip joint degenerative changes bilaterally.  No acute fracture or plain film evidence of avascular necrosis.  The pubic symphysis and SI joints are intact.  No sacral or pubic rami fractures are identified.  IMPRESSION:  1.  Mild symmetric hip joint degenerative changes bilaterally. 2.  No acute bony  findings.   Original Report Authenticated By: P. Kalman Jewels, M.D.    Dg Chest Portable 1 View  05/10/2012  *RADIOLOGY REPORT*  Clinical Data: Shortness of breath, decreased oxygen saturation during anesthesia for dental work 1 day ago  PORTABLE CHEST - 1 VIEW  Comparison: Portable exam 1358 hours compared to 04/26/2012  Findings: Enlargement of cardiac silhouette. Pulmonary vascularity normal. Low lung volumes with chronic elevation of right diaphragm. Minimal right basilar atelectasis. No infiltrate, pleural effusion or pneumothorax. No acute osseous findings.  IMPRESSION: Mild enlargement of cardiac silhouette. Low lung volumes with minimal right basilar atelectasis.   Original Report Authenticated By:  Lavonia Dana, M.D.    Dg Shoulder Left  04/26/2012  *RADIOLOGY REPORT*  Clinical Data: Unable the blood pressure, generalized pain.  LEFT SHOULDER - 2+ VIEW  Comparison: 07/06/2006.  Findings: There is no fracture.  The joint spaces are normally maintained.  There are no bony lesions.  There is a previous fracture of the midshaft of the left humerus with surgical plate. The accompanying visualized chest and ribs are normal.  IMPRESSION: No acute findings.   Original Report Authenticated By: Joaquim Lai, M.D.    Dg Knee Complete 4 Views Left  04/26/2012  *RADIOLOGY REPORT*  Clinical Data: Unable to rotate beneath the knees medially  LEFT KNEE - COMPLETE 4+ VIEW  Comparison: None.  Findings: No joint effusion.  No fractures.  No evidence of bone destruction.  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 There is mild chondrocalcinosis.  There are no soft tissue abnormalities.  IMPRESSION: No acute findings.  Advanced degenerative changes.   Original Report Authenticated By: Joaquim Lai, M.D.    Dg Knee Complete 4 Views Right  04/26/2012  *RADIOLOGY REPORT*  Clinical Data: Unable to rotate knees medially.  RIGHT KNEE - COMPLETE 4+ VIEW  Comparison: None.  Findings: There is no fracture.  There is no bone destruction. There is moderate to advanced osteoarthritic changes with a small joint effusion.  The  soft tissues are normal.  IMPRESSION:  No acute findings.  Osteoarthritis.  Probable small joint effusion.   Original Report Authenticated By: Joaquim Lai, M.D.     Microbiology: Recent Results (from the past 240 hour(s))  SURGICAL PCR SCREEN     Status: Normal   Collection Time   05/09/12 11:31 AM      Component Value Range Status Comment   MRSA, PCR NEGATIVE  NEGATIVE Final    Staphylococcus aureus NEGATIVE  NEGATIVE Final   MRSA PCR SCREENING     Status: Normal   Collection Time   05/10/12  8:01 PM      Component Value Range Status Comment   MRSA by PCR  NEGATIVE  NEGATIVE Final      Labs: Basic Metabolic Panel:  Lab [id number] 05/11/12 0510 05/10/12 1359  NA 139 139 135  K 3.7 4.0 4.9  CL 101 98 95*  CO2 32 34* 33*  GLUCOSE 135* 129* 206*  BUN 27* 31* 31*  CREATININE 1.58* 1.84* 1.83*  CALCIUM 8.4 8.4 8.5  MG -- -- --  PHOS -- -- --   Liver Function Tests:  Lab 05/12/12 0514 05/11/12 0510  AST 24 26  ALT 20 22  ALKPHOS 88 93  BILITOT 0.5 0.5  PROT 6.9 7.2  ALBUMIN 2.4* 2.5*   No results found for this basename: LIPASE:5,AMYLASE:5 in the last 168 hours No results found for this basename: AMMONIA:5 in the last 168 hours CBC:  Lab 05/12/12 0514 05/11/12 0510 05/10/12 1359  WBC 4.4 7.1 11.3*  NEUTROABS -- -- 8.9*  HGB 8.6* 9.3* 10.0*  HCT 28.4* 31.6* 33.7*  MCV 83.5 84.7 84.7  PLT 102* 112* 141*   Cardiac Enzymes:  Lab 05/10/12 1359  CKTOTAL --  CKMB --  CKMBINDEX --  TROPONINI <0.30   BNP: BNP (last 3 results) No results found for this basename: PROBNP:3 in the last 8760 hours CBG:  Lab 05/12/12 0714 05/11/12 2135 05/11/12 1626 05/11/12 1117 05/11/12 0731  GLUCAP 131* 141* 124* 192* 135*       Signed:  Navarre Diana  [facility] 05/12/2012, 6:30 PM

## 2012-05-12 NOTE — Progress Notes (Signed)
Patient removed CPAP unit;nurse called and explained why he removed and placed back on [place] at 1.5lpm. MD notified of patient and will continue to monitor patient.

## 2012-05-12 NOTE — Progress Notes (Signed)
Patient discharged to home, with significant other at 1150. Discharge instructions given regarding follow-up appointment, s+s of infection, reasons to call 911, medications, diet, and self care. Further instructions, in writing, given about sleep apnea, diabetes management, and CPAP. Pt and wife both expressed understanding, and were able to "teach back" information.

## 2012-05-12 NOTE — Care Management Note (Signed)
    Page 1 of 1   05/12/2012     11:13:50 AM   CARE MANAGEMENT NOTE 05/12/2012  Patient:  [name], [name]   Account Number:  [id number]  Date Initiated:  05/12/2012  Documentation initiated by:  Claretha Cooper  Subjective/Objective Assessment:   Pt admitted with hypoxia after a dental procedure. Lives at home with significant other.     Action/Plan:   DC home with O2   Anticipated DC Date:  05/12/2012   Anticipated DC Plan:  [place]  CM consult      Choice offered to / List presented to:     DME arranged  [facility]      DME agency  [place].        Status of service:  Completed, signed off Medicare Important Message given?   (If response is "NO", the following Medicare IM given date fields will be blank) Date Medicare IM given:   Date Additional Medicare IM given:    Discharge Disposition:  HOME/SELF CARE  Per UR Regulation:    If discussed at Long Length of Stay Meetings, dates discussed:    Comments:  05/12/12 [place] BSN CM Pt to go home on O2 due to severe hypoxia.

## 2012-05-12 NOTE — Progress Notes (Signed)
Patient continues to be monitored on 1.5lpm [place] with a saturation of 94% HR 77.

## 2012-05-12 NOTE — Progress Notes (Signed)
Patient's O2 sat checked , on room air, while patient sat on edge of bed. Sats dropped to 87%. Dr Caryn Section notified by phone. Home O2 services will be arranged.

## 2012-05-12 NOTE — Progress Notes (Signed)
ANTICOAGULATION CONSULT NOTE   Pharmacy Consult for Lovenox & Warfarin Indication: pulmonary embolus and DVT  Allergies  Allergen Reactions  . Bee Venom Shortness Of Breath and Swelling  . Penicillins Shortness Of Breath  . Iohexol      Code: RASH, Desc: PT WAS ON PREDNISONE FOR GOUT TX. @ TIME OF SCAN AND RECEIVED 50 MG OF BENADRYL IV-ARS 10/08/07   . Shellfish Allergy Nausea And Vomiting and Other (See Comments)    Feels like insides are twisting  . Iodine Rash  . Latex Rash   Patient Measurements: Height: 6\' 3"  (190.5 cm) Weight: 467 lb (211.83 kg) IBW/kg (Calculated) : 84.5   Vital Signs: Temp: 98.1 F (36.7 C) (11/14 0735) Temp src: Oral (11/14 0735) BP: 126/62 mmHg (11/14 0700) Pulse Rate: 68  (11/14 0600)  Labs:  Basename 05/12/12 0514 05/11/12 0510 05/10/12 1359  HGB 8.6* 9.3* --  HCT 28.4* 31.6* 33.7*  PLT 102* 112* 141*  APTT -- -- --  LABPROT 18.6* 16.7* 16.4*  INR 1.61* 1.39 1.35  HEPARINUNFRC -- -- --  CREATININE 1.58* 1.84* 1.83*  CKTOTAL -- -- --  CKMB -- -- --  TROPONINI -- -- <0.30   Estimated Creatinine Clearance: 113.1 ml/min (by C-G formula based on Cr of 1.58).  Medical History: Past Medical History  Diagnosis Date  . Sleep apnea     CPAP  . Seizures   . Migraine   . Anxiety   . Mental disorder   . Bipolar 1 disorder   . Bronchitis     hx of  . Dyspnea     with ambulation  . Diabetes mellitus without complication   . Neuromuscular disorder   . Diabetic neuropathy   . Arthritis   . GERD (gastroesophageal reflux disease)   . Anemia   . Bruises easily   . Chronic kidney disease     decreased left kidney fx  . DVT (deep venous thrombosis)     LLE DVT ~ '12  . PE (pulmonary embolism)     bilateral PE ~ '11  . Thrombocytopenia 05/11/2012  . Chronic pain syndrome 05/11/2012   Medications:  Medications Prior to Admission  Medication Sig Dispense Refill  . busPIRone (BUSPAR) 15 MG tablet Take 15 mg by mouth 2 (two) times daily.       . Canagliflozin (INVOKANA) 100 MG TABS Take 100 mg by mouth daily.      . citalopram (CELEXA) 40 MG tablet Take 60 mg by mouth at bedtime.      . cyclobenzaprine (FLEXERIL) 5 MG tablet Take 5 mg by mouth 2 (two) times daily as needed. For muscle spasms      . docusate sodium (COLACE) 100 MG capsule Take 100 mg by mouth 2 (two) times daily.      . Fe Fum-FA-B Cmp-C-Zn-Mg-Mn-Cu (HEMOCYTE PLUS PO) Take 1 tablet by mouth daily.      . fentaNYL (DURAGESIC - DOSED MCG/HR) 75 MCG/HR Place 1 patch onto the skin every 3 (three) days.      . fluticasone (FLONASE) 50 MCG/ACT nasal spray Place 1 spray into the nose 2 (two) times daily.      . furosemide (LASIX) 40 MG tablet Take 60 mg by mouth daily.      . hydroxypropyl methylcellulose (ISOPTO TEARS) 2.5 % ophthalmic solution Place 1-2 drops into both eyes 2 (two) times daily.      . insulin aspart (NOVOLOG) 100 UNIT/ML injection Inject 2-12 Units into the skin 4 (four)  times daily -  before meals and at bedtime.      . insulin glargine (LANTUS) 100 UNIT/ML injection Inject 110 Units into the skin at bedtime.      . lamoTRIgine (LAMICTAL) 200 MG tablet Take 200 mg by mouth 2 (two) times daily.      Marland Kitchen levETIRAcetam (KEPPRA) 500 MG tablet Take 500 mg by mouth 2 (two) times daily.      . Multiple Vitamin (MULTIVITAMIN WITH MINERALS) TABS Take 1 tablet by mouth daily.      Marland Kitchen omeprazole (PRILOSEC) 20 MG capsule Take 20 mg by mouth 2 (two) times daily.      Marland Kitchen oxyCODONE-acetaminophen (PERCOCET) 5-325 MG per tablet Take 1-2 tablets by mouth every 4 (four) hours as needed for pain.  40 tablet  0  . potassium chloride (K-DUR) 10 MEQ tablet Take 30 mEq by mouth daily.      . pregabalin (LYRICA) 50 MG capsule Take 50 mg by mouth 3 (three) times daily.      . simvastatin (ZOCOR) 20 MG tablet Take 20 mg by mouth at bedtime.      . Tamsulosin HCl (FLOMAX) 0.4 MG CAPS Take 0.4 mg by mouth at bedtime.      Marland Kitchen warfarin (COUMADIN) 5 MG tablet Take 7.5-10 mg by mouth  daily. Take 1.5 tab every day except thursdays take 2 tabs      . zolpidem (AMBIEN) 5 MG tablet Take 2.5 mg by mouth at bedtime as needed. For sleep      . clindamycin (CLEOCIN) 300 MG capsule Take 1 capsule (300 mg total) by mouth 3 (three) times daily.  21 capsule  0   Assessment: Okay for Protocol Recent Dental Extraction Procedure on (11/11). Patient with a history of DVT and PE on Warfarin at home but sub therapeutic INR.  Home dose as above.  INR approaching goal.  Platelets continue to drop.  H/H down slightly.  Minor nose bleed reported by nursing yesterday but stopped quickly.  Water bottle was attached to O2 to prevent drying of mucosa.   Goal of Therapy:  Anti-Xa level 0.6-1.2 units/ml 4hrs after LMWH dose given Monitor platelets by anticoagulation protocol: Yes INR goal 2-3   Plan:  Lovenox 1mg /kg SQ every 12 hours until INR at goal CBC monitoring. Consider Anti-Xa level at steady state due to obesity. Warfarin 10mg  PO x 1. Daily INR  Hart Robinsons A 05/12/2012,8:12 AM

## 2012-05-13 LAB — FOLATE: Folate: >24 ng/mL (ref >5.4)

## 2012-05-20 ENCOUNTER — Encounter: Payer: Medicare Other | Admitting: *Deleted

## 2012-05-23 ENCOUNTER — Encounter: Payer: Medicare Other | Admitting: *Deleted

## 2012-07-08 IMAGING — CR DG CHEST 2V
2 series · 2 of 2 positions shown · non-contrast
Comparison: 07/14/2010

CLINICAL DATA: Short of breath.  History of pulmonary emboli.

CHEST - 2 VIEW

[view not recorded (1 of 2)]
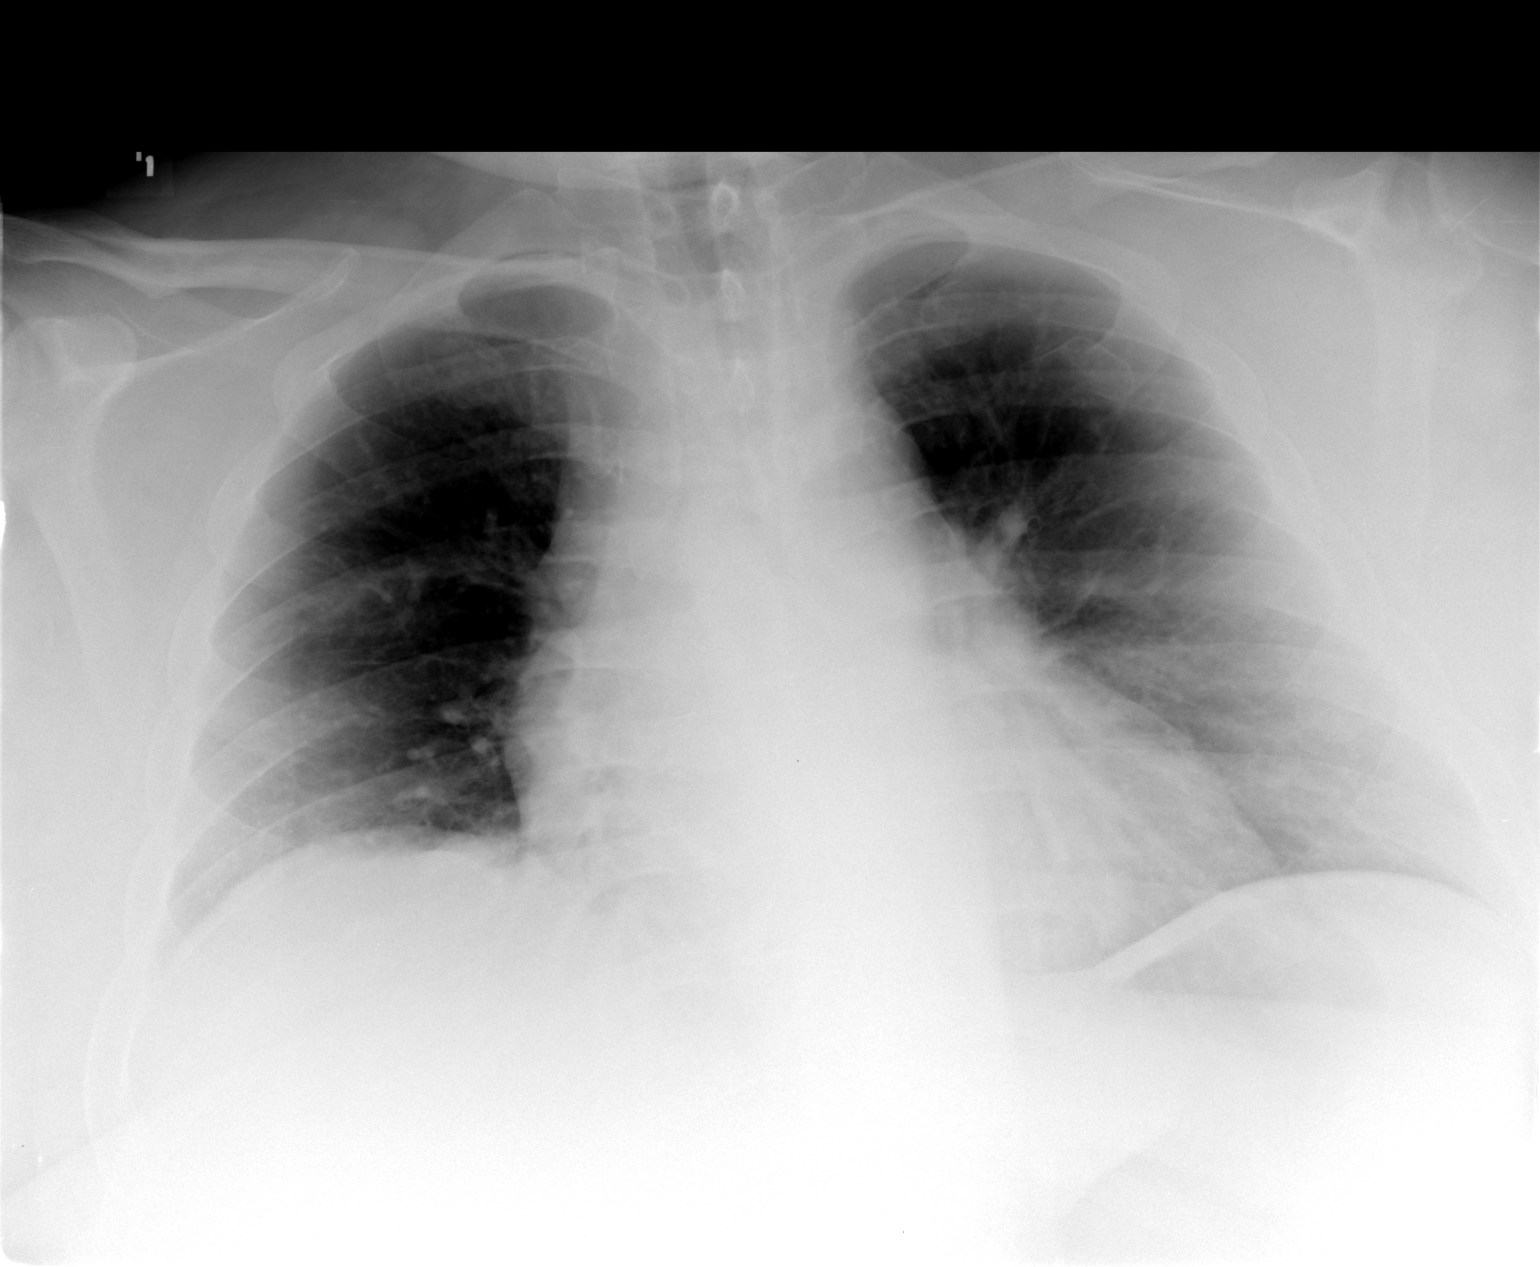

[view not recorded (2 of 2)]
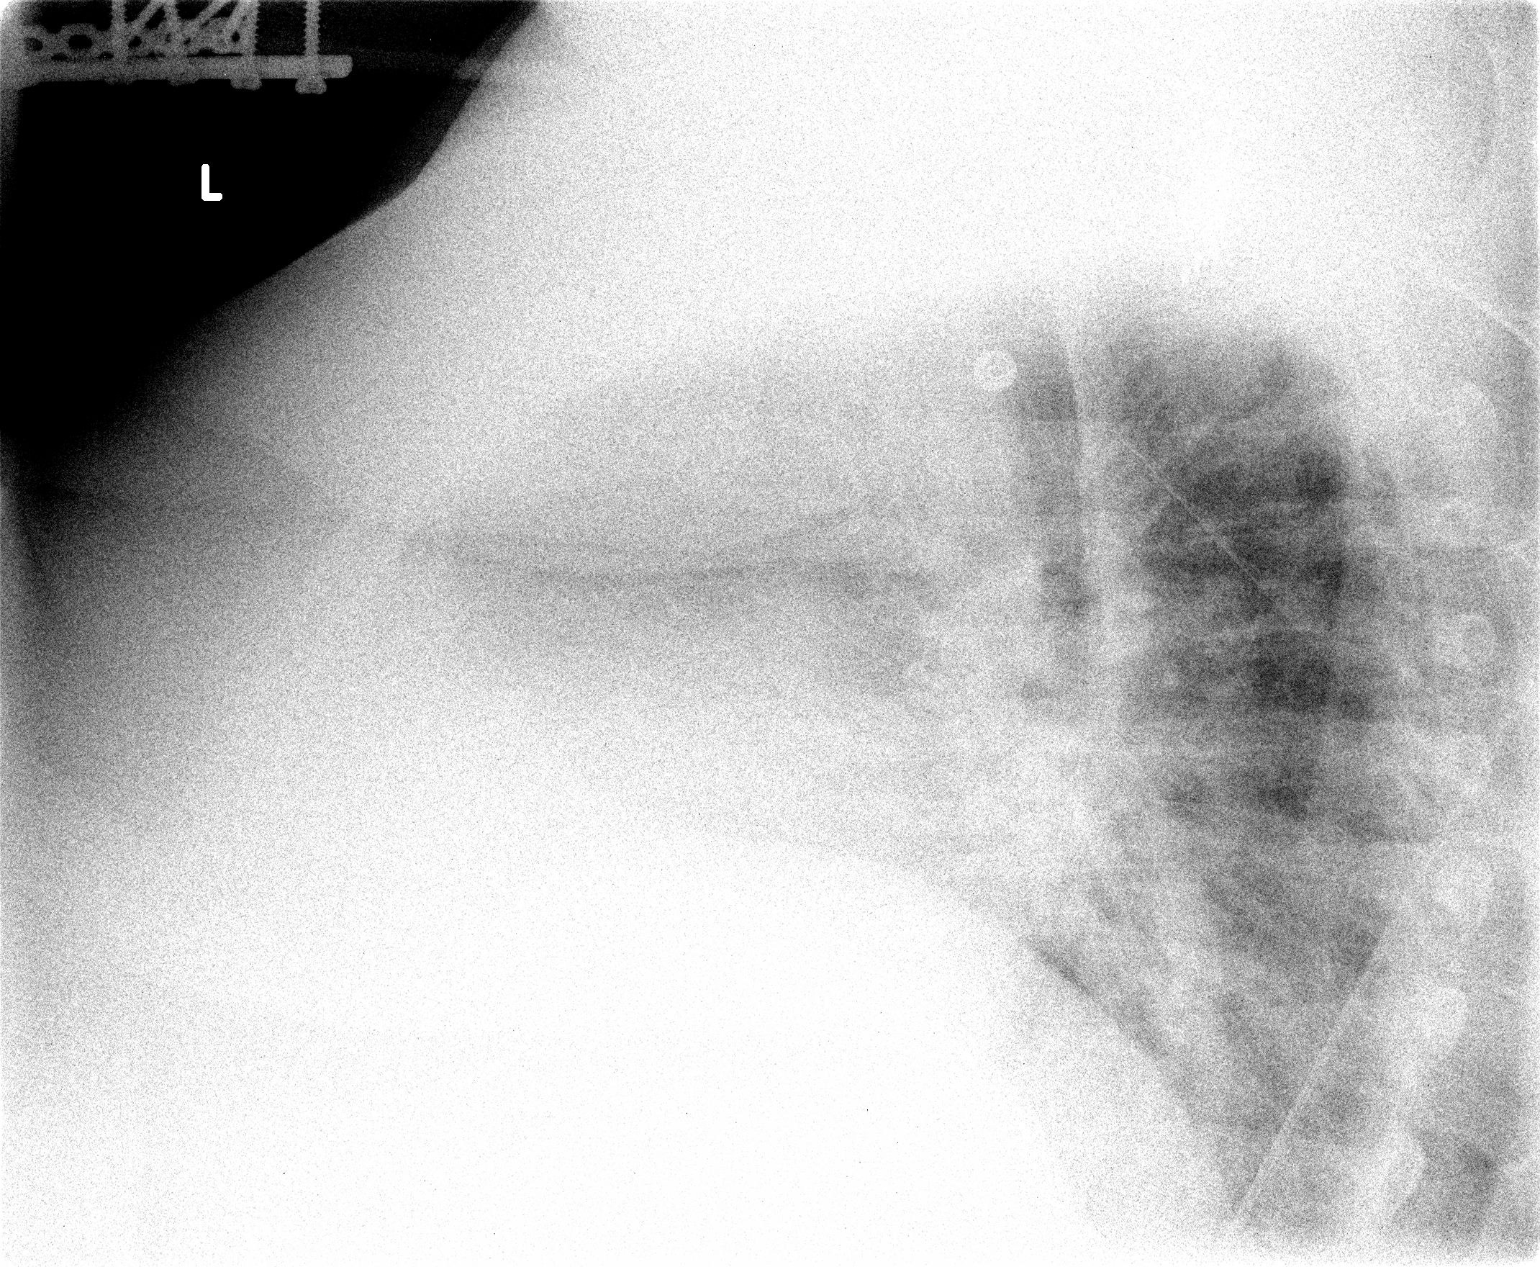

[2 of 2 positions shown; findings below may reference images not displayed]

FINDINGS: The heart is at the upper limits normal in size.  The
lungs are clear.  No infiltrate, effusion or collapse.  No evidence
of heart failure.
IMPRESSION: No active disease

## 2012-09-15 ENCOUNTER — Other Ambulatory Visit: Payer: Self-pay | Admitting: Pharmacist

## 2012-09-15 ENCOUNTER — Ambulatory Visit: Payer: Self-pay | Admitting: Pharmacist

## 2012-09-15 DIAGNOSIS — G894 Chronic pain syndrome: Secondary | ICD-10-CM

## 2012-09-15 DIAGNOSIS — I82409 Acute embolism and thrombosis of unspecified deep veins of unspecified lower extremity: Secondary | ICD-10-CM

## 2012-09-15 DIAGNOSIS — J810 Acute pulmonary edema: Secondary | ICD-10-CM | POA: Insufficient documentation

## 2012-09-15 DIAGNOSIS — Z86718 Personal history of other venous thrombosis and embolism: Secondary | ICD-10-CM | POA: Insufficient documentation

## 2012-09-15 LAB — POCT INR: INR: 2.2

## 2012-09-15 LAB — PROTIME-INR

## 2012-09-15 MED ORDER — FENTANYL 75 MCG/HR TD PT72: 1.0000 | MEDICATED_PATCH | TRANSDERMAL | Status: DC

## 2012-09-15 MED ORDER — HYDROCODONE-ACETAMINOPHEN 7.5-325 MG PO TABS: 1.0000 | ORAL_TABLET | Freq: Every day | ORAL | Status: DC

## 2012-09-21 ENCOUNTER — Encounter: Payer: Self-pay | Admitting: Physician Assistant

## 2012-09-21 NOTE — Progress Notes (Signed)
Yes this result has been communicated with patient - please see notes on scanned copy.

## 2012-09-22 ENCOUNTER — Telehealth: Payer: Self-pay | Admitting: Pharmacist

## 2012-09-22 ENCOUNTER — Ambulatory Visit (INDEPENDENT_AMBULATORY_CARE_PROVIDER_SITE_OTHER): Payer: Medicare Other | Admitting: Pharmacist

## 2012-09-22 DIAGNOSIS — I82409 Acute embolism and thrombosis of unspecified deep veins of unspecified lower extremity: Secondary | ICD-10-CM

## 2012-09-22 DIAGNOSIS — J810 Acute pulmonary edema: Secondary | ICD-10-CM

## 2012-09-22 LAB — PROTIME-INR

## 2012-09-22 LAB — POCT INR: INR: 2.7

## 2012-09-22 NOTE — Telephone Encounter (Signed)
INR was 2.7 09/22/2012 Therapeutic anticoag Continue current therapy

## 2012-09-27 ENCOUNTER — Encounter: Payer: Self-pay | Admitting: Physician Assistant

## 2012-09-29 ENCOUNTER — Telehealth (INDEPENDENT_AMBULATORY_CARE_PROVIDER_SITE_OTHER): Payer: Medicare Other | Admitting: Pharmacist

## 2012-09-29 ENCOUNTER — Ambulatory Visit (INDEPENDENT_AMBULATORY_CARE_PROVIDER_SITE_OTHER): Payer: Medicare Other | Admitting: Pharmacist

## 2012-09-29 DIAGNOSIS — J810 Acute pulmonary edema: Secondary | ICD-10-CM

## 2012-09-29 DIAGNOSIS — I82409 Acute embolism and thrombosis of unspecified deep veins of unspecified lower extremity: Secondary | ICD-10-CM

## 2012-09-29 LAB — PROTIME-INR

## 2012-09-29 LAB — POCT INR: INR: 2.5

## 2012-09-29 NOTE — Telephone Encounter (Signed)
Error

## 2012-10-02 ENCOUNTER — Other Ambulatory Visit: Payer: Self-pay | Admitting: Physician Assistant

## 2012-10-05 ENCOUNTER — Other Ambulatory Visit: Payer: Self-pay | Admitting: Pharmacist

## 2012-10-05 DIAGNOSIS — G894 Chronic pain syndrome: Secondary | ICD-10-CM

## 2012-10-05 MED ORDER — FENTANYL 75 MCG/HR TD PT72: 1.0000 | MEDICATED_PATCH | TRANSDERMAL | Status: DC

## 2012-10-05 NOTE — Telephone Encounter (Signed)
Patient is getting married April 12th and will be on honeymoon next week when Rx due. Fentanyl rx written.

## 2012-10-06 LAB — POCT INR: INR: 2.5

## 2012-10-06 LAB — PROTIME-INR

## 2012-10-10 ENCOUNTER — Ambulatory Visit (INDEPENDENT_AMBULATORY_CARE_PROVIDER_SITE_OTHER): Payer: Medicare Other | Admitting: Pharmacist

## 2012-10-10 DIAGNOSIS — J810 Acute pulmonary edema: Secondary | ICD-10-CM

## 2012-10-10 DIAGNOSIS — I82409 Acute embolism and thrombosis of unspecified deep veins of unspecified lower extremity: Secondary | ICD-10-CM

## 2012-10-16 LAB — POCT INR: INR: 2.3

## 2012-10-17 ENCOUNTER — Ambulatory Visit (INDEPENDENT_AMBULATORY_CARE_PROVIDER_SITE_OTHER): Payer: Medicare Other | Admitting: Pharmacist

## 2012-10-17 DIAGNOSIS — I82409 Acute embolism and thrombosis of unspecified deep veins of unspecified lower extremity: Secondary | ICD-10-CM

## 2012-10-17 DIAGNOSIS — J810 Acute pulmonary edema: Secondary | ICD-10-CM

## 2012-10-20 ENCOUNTER — Ambulatory Visit (INDEPENDENT_AMBULATORY_CARE_PROVIDER_SITE_OTHER): Payer: Medicare Other | Admitting: Pharmacist

## 2012-10-20 DIAGNOSIS — J810 Acute pulmonary edema: Secondary | ICD-10-CM

## 2012-10-20 DIAGNOSIS — I82409 Acute embolism and thrombosis of unspecified deep veins of unspecified lower extremity: Secondary | ICD-10-CM

## 2012-10-20 LAB — POCT INR: INR: 2.1

## 2012-10-27 ENCOUNTER — Ambulatory Visit (INDEPENDENT_AMBULATORY_CARE_PROVIDER_SITE_OTHER): Payer: Medicare Other | Admitting: Pharmacist

## 2012-10-27 DIAGNOSIS — I82409 Acute embolism and thrombosis of unspecified deep veins of unspecified lower extremity: Secondary | ICD-10-CM

## 2012-10-27 DIAGNOSIS — J810 Acute pulmonary edema: Secondary | ICD-10-CM

## 2012-10-27 DIAGNOSIS — G894 Chronic pain syndrome: Secondary | ICD-10-CM

## 2012-10-27 LAB — POCT INR: INR: 2.3

## 2012-10-27 MED ORDER — HYDROCODONE-ACETAMINOPHEN 7.5-325 MG PO TABS: 1.0000 | ORAL_TABLET | Freq: Every day | ORAL | Status: DC

## 2012-11-02 ENCOUNTER — Encounter: Payer: Self-pay | Admitting: General Practice

## 2012-11-02 ENCOUNTER — Ambulatory Visit (INDEPENDENT_AMBULATORY_CARE_PROVIDER_SITE_OTHER): Payer: Medicare Other | Admitting: General Practice

## 2012-11-02 VITALS — BP 125/69 | HR 68 | Temp 97.2°F | Ht 75.0 in

## 2012-11-02 DIAGNOSIS — N529 Male erectile dysfunction, unspecified: Secondary | ICD-10-CM

## 2012-11-02 DIAGNOSIS — N400 Enlarged prostate without lower urinary tract symptoms: Secondary | ICD-10-CM

## 2012-11-02 MED ORDER — TADALAFIL 5 MG PO TABS: 5.0000 mg | ORAL_TABLET | Freq: Every day | ORAL | Status: DC | PRN

## 2012-11-02 NOTE — Progress Notes (Signed)
  Subjective:    Patient ID: [name], male    DOB: 1966-03-30, 47 y.o.   MRN: [id number]  HPI Reports today to discuss his one of his medications. He wishes to discontinue flomax (0.4mg  at bedtime) and begin Cialis. Reports recently getting married and has had erectile dysfunction for past 5-6 years. Reports taking medication as prescribed. Denies seeing a urologist. Reports seeing nephrologist every 6 months.     Review of Systems  Constitutional: Negative for fever and chills.  Respiratory: Negative for chest tightness and shortness of breath.   Cardiovascular: Negative for chest pain and palpitations.  Genitourinary: Negative for dysuria and difficulty urinating.       Erectile dysfunction  Neurological: Negative for dizziness and headaches.       Objective:   Physical Exam  Constitutional: He is oriented to person, place, and time. He appears well-developed and well-nourished.  Cardiovascular: Normal rate, regular rhythm and normal heart sounds.   No murmur heard. Pulmonary/Chest: Effort normal and breath sounds normal. No respiratory distress. He exhibits no tenderness.  Neurological: He is alert and oriented to person, place, and time.  Skin: Skin is warm and dry.  Psychiatric: He has a normal mood and affect.          Assessment & Plan:  Take medications as prescribed Discussed side effects of cialis Discontinue flomax RTO if symptoms develop Patient verbalized understanding Erby Pian, FNP-C

## 2012-11-02 NOTE — Patient Instructions (Addendum)
Tadalafil tablets (Cialis) What is this medicine? TADALAFIL (tah DA la fil) is used to treat erection problems in men. It is also used for enlargement of the prostate gland in men, a condition called benign prostatic hyperplasia or BPH. This medicine improves urine flow and reduces BPH symptoms. This medicine can also treat both erection problems and BPH when they occur together. This medicine may be used for other purposes; ask your health care provider or pharmacist if you have questions. What should I tell my health care provider before I take this medicine? They need to know if you have any of these conditions: -bleeding disorders -eye or vision problems, including a rare inherited eye disease called retinitis pigmentosa -anatomical deformation of the penis, Peyronie's disease, or history of priapism (painful and prolonged erection) -heart disease, angina, a history of heart attack, irregular heart beats, or other heart problems -high or low blood pressure -history of blood diseases, like sickle cell anemia or leukemia -history of stomach bleeding -kidney disease -liver disease -stroke -an unusual or allergic reaction to tadalafil, other medicines, foods, dyes, or preservatives -pregnant or trying to get pregnant -breast-feeding How should I use this medicine? Take this medicine by mouth with a glass of water. Follow the directions on the prescription label. You may take this medicine with or without meals. When this medicine is used for erection problems, your doctor may prescribe it to be taken once daily or as needed. If you are taking the medicine as needed, you may be able to have sexual activity 30 minutes after taking it and for up to 36 hours after taking it. Whether you are taking the medicine as needed or once daily, you should not take more than one dose per day. If you are taking this medicine for symptoms of benign prostatic hyperplasia (BPH) or to treat both BPH and an erection  problem, take the dose once daily at about the same time each day. Do not take your medicine more often than directed. Talk to your pediatrician regarding the use of this medicine in children. Special care may be needed. Overdosage: If you think you have taken too much of this medicine contact a poison control center or emergency room at once. NOTE: This medicine is only for you. Do not share this medicine with others. What if I miss a dose? If you are taking this medicine as needed for erection problems, this does not apply. If you miss a dose while taking this medicine once daily for an erection problem, benign prostatic hyperplasia, or both, take it as soon as you remember, but do not take more than one dose per day. What may interact with this medicine? Do not take this medicine with any of the following medications: -nitrates like amyl nitrite, isosorbide dinitrate, isosorbide mononitrate, nitroglycerin -other medicines for erectile dysfunction like avanafil, sildenafil, vardenafil -other tadalafil products (Adcirca)  This medicine may also interact with the following medications: -certain drugs for high blood pressure -certain drugs for the treatment of HIV infection or AIDS -certain drugs used for fungal or yeast infections, like fluconazole, itraconazole, ketoconazole, and voriconazole -certain drugs used for seizures like carbamazepine, phenytoin, and phenobarbital -grapefruit juice -macrolide antibiotics like clarithromycin, erythromycin, troleandomycin -medicines for prostate problems -rifabutin, rifampin or rifapentine This list may not describe all possible interactions. Give your health care provider a list of all the medicines, herbs, non-prescription drugs, or dietary supplements you use. Also tell them if you smoke, drink alcohol, or use illegal drugs. Some items  may interact with your medicine. What should I watch for while using this medicine? If you notice any changes in  your vision while taking this drug, call your doctor or health care professional as soon as possible. Stop using this medicine and call your health care provider right away if you have a loss of sight in one or both eyes. Contact you doctor or health care professional right away if the erection lasts longer than 4 hours or if it becomes painful. This may be a sign of serious problem and must be treated right away to prevent permanent damage. If you experience symptoms of nausea, dizziness, chest pain or arm pain upon initiation of sexual activity after taking this medicine, you should refrain from further activity and call your doctor or health care professional as soon as possible. Do not drink alcohol to excess (examples, 5 glasses of wine or 5 shots of whiskey) when taking this medicine. When taken in excess, alcohol can increase your chances of getting a headache or getting dizzy, increasing your heart rate or lowering your blood pressure. Using this medicine does not protect you or your partner against HIV infection (the virus that causes AIDS) or other sexually transmitted diseases. What side effects may I notice from receiving this medicine? Side effects that you should report to your doctor or health care professional as soon as possible: -allergic reactions like skin rash, itching or hives, swelling of the face, lips, or tongue -breathing problems -changes in hearing -changes in vision -chest pain -erection lasting more than 4 hours -fast, irregular heartbeat -seizures  Side effects that usually do not require medical attention (report to your doctor or health care professional if they continue or are bothersome): -back pain -dizziness -flushing -headache -indigestion -muscle aches -nausea -stuffy or runny nose This list may not describe all possible side effects. Call your doctor for medical advice about side effects. You may report side effects to FDA at 1-800-FDA-1088. Where  should I keep my medicine? Keep out of the reach of children. Store at room temperature between 15 and 30 degrees C (59 and 86 degrees F). Throw away any unused medicine after the expiration date. NOTE: This sheet is a summary. It may not cover all possible information. If you have questions about this medicine, talk to your doctor, pharmacist, or health care provider.  2012, Elsevier/Gold Standard. (12/09/2010 1:19:37 PM)

## 2012-11-03 ENCOUNTER — Telehealth: Payer: Self-pay | Admitting: General Practice

## 2012-11-03 ENCOUNTER — Other Ambulatory Visit: Payer: Self-pay | Admitting: General Practice

## 2012-11-03 ENCOUNTER — Ambulatory Visit (INDEPENDENT_AMBULATORY_CARE_PROVIDER_SITE_OTHER): Payer: Medicare Other | Admitting: Pharmacist

## 2012-11-03 DIAGNOSIS — I82409 Acute embolism and thrombosis of unspecified deep veins of unspecified lower extremity: Secondary | ICD-10-CM

## 2012-11-03 DIAGNOSIS — N400 Enlarged prostate without lower urinary tract symptoms: Secondary | ICD-10-CM

## 2012-11-03 DIAGNOSIS — N529 Male erectile dysfunction, unspecified: Secondary | ICD-10-CM

## 2012-11-03 DIAGNOSIS — J810 Acute pulmonary edema: Secondary | ICD-10-CM

## 2012-11-03 LAB — POCT INR: INR: 2

## 2012-11-03 MED ORDER — TADALAFIL 5 MG PO TABS: 5.0000 mg | ORAL_TABLET | Freq: Every day | ORAL | Status: DC

## 2012-11-03 NOTE — Telephone Encounter (Signed)
Please advise 

## 2012-11-03 NOTE — Telephone Encounter (Signed)
Please inform patient that medication has been resent pharmacy.

## 2012-11-04 NOTE — Telephone Encounter (Signed)
Pt aware med  At [facility]

## 2012-11-14 ENCOUNTER — Telehealth: Payer: Self-pay | Admitting: Pharmacist

## 2012-11-14 DIAGNOSIS — G894 Chronic pain syndrome: Secondary | ICD-10-CM

## 2012-11-14 MED ORDER — FENTANYL 75 MCG/HR TD PT72: 1.0000 | MEDICATED_PATCH | TRANSDERMAL | Status: DC

## 2012-11-14 NOTE — Telephone Encounter (Signed)
Rx for fentanyl printed and signed by Aleen Sells, FNP

## 2012-11-16 ENCOUNTER — Telehealth: Payer: Self-pay | Admitting: General Practice

## 2012-11-17 ENCOUNTER — Ambulatory Visit: Payer: Self-pay | Admitting: Pharmacist

## 2012-11-17 ENCOUNTER — Telehealth: Payer: Self-pay | Admitting: Pharmacist

## 2012-11-17 DIAGNOSIS — J810 Acute pulmonary edema: Secondary | ICD-10-CM

## 2012-11-17 DIAGNOSIS — I82409 Acute embolism and thrombosis of unspecified deep veins of unspecified lower extremity: Secondary | ICD-10-CM

## 2012-11-17 LAB — PROTIME-INR

## 2012-11-17 LAB — POCT INR: INR: 2.2

## 2012-11-18 NOTE — Telephone Encounter (Signed)
done

## 2012-11-18 NOTE — Telephone Encounter (Signed)
Zadie Rhine spoke with pt and is working hard on request

## 2012-11-24 ENCOUNTER — Ambulatory Visit: Payer: Self-pay | Admitting: Pharmacist

## 2012-11-24 DIAGNOSIS — J810 Acute pulmonary edema: Secondary | ICD-10-CM

## 2012-11-24 DIAGNOSIS — I82409 Acute embolism and thrombosis of unspecified deep veins of unspecified lower extremity: Secondary | ICD-10-CM

## 2012-11-24 LAB — POCT INR: INR: 2.4

## 2012-11-24 LAB — PROTIME-INR

## 2012-12-01 ENCOUNTER — Ambulatory Visit: Payer: Self-pay | Admitting: Pharmacist

## 2012-12-01 DIAGNOSIS — I82409 Acute embolism and thrombosis of unspecified deep veins of unspecified lower extremity: Secondary | ICD-10-CM

## 2012-12-01 DIAGNOSIS — J810 Acute pulmonary edema: Secondary | ICD-10-CM

## 2012-12-01 DIAGNOSIS — G894 Chronic pain syndrome: Secondary | ICD-10-CM

## 2012-12-01 LAB — POCT INR: INR: 2.1

## 2012-12-01 LAB — PROTIME-INR

## 2012-12-01 MED ORDER — HYDROCODONE-ACETAMINOPHEN 7.5-325 MG PO TABS: 1.0000 | ORAL_TABLET | Freq: Every day | ORAL | Status: DC

## 2012-12-01 NOTE — Progress Notes (Signed)
Patient asked for refill on PRN pain medication - last rx 10/27/12 - on time.

## 2012-12-07 ENCOUNTER — Other Ambulatory Visit: Payer: Self-pay | Admitting: *Deleted

## 2012-12-07 MED ORDER — GLUCOSE BLOOD VI STRP: ORAL_STRIP | Status: DC

## 2012-12-08 ENCOUNTER — Ambulatory Visit (INDEPENDENT_AMBULATORY_CARE_PROVIDER_SITE_OTHER): Payer: Medicare Other | Admitting: Pharmacist

## 2012-12-08 ENCOUNTER — Telehealth: Payer: Self-pay | Admitting: *Deleted

## 2012-12-08 DIAGNOSIS — I82409 Acute embolism and thrombosis of unspecified deep veins of unspecified lower extremity: Secondary | ICD-10-CM

## 2012-12-08 DIAGNOSIS — J810 Acute pulmonary edema: Secondary | ICD-10-CM

## 2012-12-08 DIAGNOSIS — G894 Chronic pain syndrome: Secondary | ICD-10-CM

## 2012-12-08 LAB — POCT INR: INR: 2.1

## 2012-12-08 LAB — PROTIME-INR

## 2012-12-08 MED ORDER — FENTANYL 75 MCG/HR TD PT72: 1.0000 | MEDICATED_PATCH | TRANSDERMAL | Status: DC

## 2012-12-08 MED ORDER — GLUCOSE BLOOD VI STRP: ORAL_STRIP | Status: DC

## 2012-12-08 NOTE — Telephone Encounter (Signed)
[facility] called and needed to resend the accucheck rx with directions and a diagnosis code. Please resend

## 2012-12-08 NOTE — Telephone Encounter (Signed)
rx corrected and sent to pharmacy

## 2012-12-09 ENCOUNTER — Telehealth: Payer: Self-pay | Admitting: *Deleted

## 2012-12-09 NOTE — Telephone Encounter (Signed)
Pt aware, rx for fenanyl patch ready.

## 2012-12-14 LAB — PROTIME-INR

## 2012-12-15 ENCOUNTER — Ambulatory Visit: Payer: Self-pay | Admitting: Nurse Practitioner

## 2012-12-15 DIAGNOSIS — I82409 Acute embolism and thrombosis of unspecified deep veins of unspecified lower extremity: Secondary | ICD-10-CM

## 2012-12-15 DIAGNOSIS — J810 Acute pulmonary edema: Secondary | ICD-10-CM

## 2012-12-15 LAB — POCT INR: INR: 1.9

## 2012-12-22 ENCOUNTER — Ambulatory Visit (INDEPENDENT_AMBULATORY_CARE_PROVIDER_SITE_OTHER): Payer: Medicare Other | Admitting: Pharmacist

## 2012-12-22 DIAGNOSIS — J810 Acute pulmonary edema: Secondary | ICD-10-CM

## 2012-12-22 DIAGNOSIS — I82409 Acute embolism and thrombosis of unspecified deep veins of unspecified lower extremity: Secondary | ICD-10-CM

## 2012-12-22 LAB — POCT INR: INR: 1.9

## 2012-12-22 LAB — PROTIME-INR

## 2012-12-26 ENCOUNTER — Other Ambulatory Visit: Payer: Self-pay

## 2012-12-26 MED ORDER — CANAGLIFLOZIN 100 MG PO TABS: 1.0000 | ORAL_TABLET | Freq: Every day | ORAL | Status: DC

## 2012-12-26 MED ORDER — CANAGLIFLOZIN 100 MG PO TABS: 100.0000 mg | ORAL_TABLET | Freq: Every day | ORAL | Status: DC

## 2012-12-29 ENCOUNTER — Ambulatory Visit (INDEPENDENT_AMBULATORY_CARE_PROVIDER_SITE_OTHER): Payer: Medicare Other | Admitting: Pharmacist

## 2012-12-29 DIAGNOSIS — J810 Acute pulmonary edema: Secondary | ICD-10-CM

## 2012-12-29 DIAGNOSIS — I82409 Acute embolism and thrombosis of unspecified deep veins of unspecified lower extremity: Secondary | ICD-10-CM

## 2012-12-29 LAB — PROTIME-INR

## 2012-12-29 LAB — POCT INR: INR: 2

## 2013-01-05 ENCOUNTER — Ambulatory Visit (INDEPENDENT_AMBULATORY_CARE_PROVIDER_SITE_OTHER): Payer: Medicare Other | Admitting: Pharmacist

## 2013-01-05 DIAGNOSIS — I82409 Acute embolism and thrombosis of unspecified deep veins of unspecified lower extremity: Secondary | ICD-10-CM

## 2013-01-05 DIAGNOSIS — J810 Acute pulmonary edema: Secondary | ICD-10-CM

## 2013-01-05 LAB — POCT INR: INR: 2

## 2013-01-06 ENCOUNTER — Ambulatory Visit: Payer: Medicare Other | Admitting: General Practice

## 2013-01-11 ENCOUNTER — Ambulatory Visit (INDEPENDENT_AMBULATORY_CARE_PROVIDER_SITE_OTHER): Payer: Medicare Other | Admitting: General Practice

## 2013-01-11 ENCOUNTER — Encounter: Payer: Self-pay | Admitting: General Practice

## 2013-01-11 ENCOUNTER — Other Ambulatory Visit: Payer: Self-pay | Admitting: Physician Assistant

## 2013-01-11 VITALS — BP 108/70 | HR 72 | Temp 97.0°F | Ht 75.0 in | Wt >= 6400 oz

## 2013-01-11 DIAGNOSIS — N400 Enlarged prostate without lower urinary tract symptoms: Secondary | ICD-10-CM

## 2013-01-11 DIAGNOSIS — R5381 Other malaise: Secondary | ICD-10-CM

## 2013-01-11 DIAGNOSIS — G4700 Insomnia, unspecified: Secondary | ICD-10-CM

## 2013-01-11 DIAGNOSIS — R1900 Intra-abdominal and pelvic swelling, mass and lump, unspecified site: Secondary | ICD-10-CM

## 2013-01-11 DIAGNOSIS — R5383 Other fatigue: Secondary | ICD-10-CM

## 2013-01-11 DIAGNOSIS — N529 Male erectile dysfunction, unspecified: Secondary | ICD-10-CM

## 2013-01-11 DIAGNOSIS — E119 Type 2 diabetes mellitus without complications: Secondary | ICD-10-CM

## 2013-01-11 DIAGNOSIS — Z09 Encounter for follow-up examination after completed treatment for conditions other than malignant neoplasm: Secondary | ICD-10-CM

## 2013-01-11 DIAGNOSIS — G894 Chronic pain syndrome: Secondary | ICD-10-CM

## 2013-01-11 LAB — POCT CBC
Granulocyte percent: 64.5 %G (ref 37–80)
HCT, POC: 41.2 % — AB (ref 43.5–53.7)
Hemoglobin: 14.4 g/dL (ref 14.1–18.1)
Lymph, poc: 1.4 (ref 0.6–3.4)
MCH, POC: 30.3 pg (ref 27–31.2)
MCHC: 35 g/dL (ref 31.8–35.4)
MCV: 86.7 fL (ref 80–97)
MPV: 7.6 fL (ref 0–99.8)
POC Granulocyte: 3.2 (ref 2–6.9)
POC LYMPH PERCENT: 29.1 %L (ref 10–50)
Platelet Count, POC: 115 10*3/uL — AB (ref 142–424)
RBC: 4.8 M/uL (ref 4.69–6.13)
RDW, POC: 15.6 %
WBC: 4.9 10*3/uL (ref 4.6–10.2)

## 2013-01-11 LAB — POCT GLYCOSYLATED HEMOGLOBIN (HGB A1C): Hemoglobin A1C: 9.2

## 2013-01-11 MED ORDER — SIMVASTATIN 20 MG PO TABS: 20.0000 mg | ORAL_TABLET | Freq: Every day | ORAL | Status: DC

## 2013-01-11 MED ORDER — FENTANYL 75 MCG/HR TD PT72: 1.0000 | MEDICATED_PATCH | TRANSDERMAL | Status: DC

## 2013-01-11 MED ORDER — HYDROCODONE-ACETAMINOPHEN 7.5-325 MG PO TABS: 1.0000 | ORAL_TABLET | Freq: Every day | ORAL | Status: DC

## 2013-01-11 MED ORDER — CITALOPRAM HYDROBROMIDE 40 MG PO TABS: 60.0000 mg | ORAL_TABLET | Freq: Every day | ORAL | Status: DC

## 2013-01-11 MED ORDER — POTASSIUM CHLORIDE ER 10 MEQ PO TBCR: 30.0000 meq | EXTENDED_RELEASE_TABLET | Freq: Every day | ORAL | Status: DC

## 2013-01-11 MED ORDER — PREGABALIN 50 MG PO CAPS: 50.0000 mg | ORAL_CAPSULE | Freq: Three times a day (TID) | ORAL | Status: DC

## 2013-01-11 MED ORDER — HYPROMELLOSE (GONIOSCOPIC) 2.5 % OP SOLN: 1.0000 [drp] | Freq: Two times a day (BID) | OPHTHALMIC | Status: DC

## 2013-01-11 MED ORDER — TADALAFIL 5 MG PO TABS: 5.0000 mg | ORAL_TABLET | Freq: Every day | ORAL | Status: DC

## 2013-01-11 MED ORDER — FLUTICASONE PROPIONATE 50 MCG/ACT NA SUSP: 1.0000 | Freq: Two times a day (BID) | NASAL | Status: DC

## 2013-01-11 MED ORDER — CYCLOBENZAPRINE HCL 10 MG PO TABS: 5.0000 mg | ORAL_TABLET | Freq: Every day | ORAL | Status: DC | PRN

## 2013-01-11 MED ORDER — METHOCARBAMOL 750 MG PO TABS: 750.0000 mg | ORAL_TABLET | Freq: Two times a day (BID) | ORAL | Status: DC | PRN

## 2013-01-11 MED ORDER — LEVETIRACETAM 500 MG PO TABS: 500.0000 mg | ORAL_TABLET | Freq: Two times a day (BID) | ORAL | Status: DC

## 2013-01-11 MED ORDER — ESOMEPRAZOLE MAGNESIUM 40 MG PO CPDR: 40.0000 mg | DELAYED_RELEASE_CAPSULE | Freq: Every day | ORAL | Status: DC

## 2013-01-11 MED ORDER — BUSPIRONE HCL 15 MG PO TABS: 15.0000 mg | ORAL_TABLET | Freq: Two times a day (BID) | ORAL | Status: DC

## 2013-01-11 MED ORDER — INSULIN GLARGINE 100 UNIT/ML ~~LOC~~ SOLN: 110.0000 [IU] | Freq: Every day | SUBCUTANEOUS | Status: DC

## 2013-01-11 MED ORDER — LAMOTRIGINE 200 MG PO TABS: 200.0000 mg | ORAL_TABLET | Freq: Two times a day (BID) | ORAL | Status: DC

## 2013-01-11 MED ORDER — INSULIN ASPART 100 UNIT/ML ~~LOC~~ SOLN: 2.0000 [IU] | Freq: Three times a day (TID) | SUBCUTANEOUS | Status: DC

## 2013-01-11 MED ORDER — ZOLPIDEM TARTRATE 5 MG PO TABS: 2.5000 mg | ORAL_TABLET | Freq: Every evening | ORAL | Status: DC | PRN

## 2013-01-11 NOTE — Progress Notes (Signed)
Subjective:    Patient ID: [name], male    DOB: 12/28/1965, 47 y.o.   MRN: [id number]  HPI Patient presents today for follow up of chronic health conditions. He has a history of diabetes, BPH, seizures, neuropathy, chronic pain (back, knees, shoulders), depression, bipolar. He reports taking medications as directed. He reports cialis works better for him (flow of urine comes easier and steadier stream), than flomax and would like to continue using it and discontinue flomax. Blood sugars checking blood sugars at home and range 150-250's. He denies muscle aches.  Patient reports noticing a bulging area, painless to mid abdomen area. He reports continue periods of feeling tired, which has been present for two years.    Review of Systems  Constitutional: Negative for fever and chills.  HENT: Negative for facial swelling.   Respiratory: Negative for cough, chest tightness and shortness of breath.   Cardiovascular: Negative for chest pain and palpitations.  Gastrointestinal: Negative for nausea, vomiting, abdominal pain and blood in stool.  Genitourinary: Positive for difficulty urinating. Negative for flank pain.       Takes cialis to prevent  Musculoskeletal: Positive for back pain.       Chronic back pain  Neurological: Negative for dizziness, weakness and headaches.       Objective:   Physical Exam  Constitutional: He is oriented to person, place, and time. He appears well-developed and well-nourished.  HENT:  Head: Normocephalic and atraumatic.  Right Ear: External ear normal.  Left Ear: External ear normal.  Mouth/Throat: Oropharynx is clear and moist.  Cardiovascular: Normal rate, regular rhythm and normal heart sounds.   Pulmonary/Chest: Effort normal and breath sounds normal. No respiratory distress. He exhibits no tenderness.  Abdominal: Soft. Bowel sounds are normal. There is no CVA tenderness, no tenderness at McBurney's point and negative Murphy's sign.  Obese  abdomen Soft, non-tender mass noted to mid abdomen from umbilicus to epigastric region  Neurological: He is alert and oriented to person, place, and time.  Skin: Skin is warm and dry.  Psychiatric: He has a normal mood and affect.          Assessment & Plan:  1. Follow-up exam, 3-6 months since previous exam - POCT CBC - COMPLETE METABOLIC PANEL WITH GFR - NMR Lipoprofile with Lipids  2. Diabetes - POCT glycosylated hemoglobin (Hb A1C)  3. Tiredness - Vitamin B12 - Testosterone, Total & Free Direct  4. Chronic pain syndrome - HYDROcodone-acetaminophen (NORCO) 7.5-325 MG per tablet; Take 1 tablet by mouth daily. As needed for BTP (1 tablet per day)  Dispense: 30 tablet; Refill: 0 - fentaNYL (DURAGESIC) 75 MCG/HR; Place 1 patch (75 mcg total) onto the skin every 3 (three) days. Can fill Monday, June 16th  Dispense: 10 patch; Refill: 0 - HYDROcodone-acetaminophen (NORCO) 7.5-325 MG per tablet; Take 1 tablet by mouth daily. As needed for BTP (1 tablet per day)  Dispense: 30 tablet; Refill: 0  5. BPH (benign prostatic hyperplasia) and 6. Erectile dysfunction - tadalafil (CIALIS) 5 MG tablet; Take 1 tablet (5 mg total) by mouth daily.  Dispense: 30 tablet; Refill: 3  7. Insomnia - zolpidem (AMBIEN) 5 MG tablet; Take 0.5 tablets (2.5 mg total) by mouth at bedtime as needed. For sleep  Dispense: 30 tablet; Refill: 3  8. Abdominal mass - CT Abdomen Pelvis Wo Contrast; Future -Continue all current medications Labs pending F/u in 3 months and sooner if symptoms worsen or seek emergency medical attention Discussed regular exercise and healthy  eating Patient verbalized understanding -Erby Pian, FNP-C

## 2013-01-12 ENCOUNTER — Telehealth: Payer: Self-pay | Admitting: Pharmacist

## 2013-01-12 ENCOUNTER — Ambulatory Visit: Payer: Self-pay | Admitting: Pharmacist

## 2013-01-12 DIAGNOSIS — I82409 Acute embolism and thrombosis of unspecified deep veins of unspecified lower extremity: Secondary | ICD-10-CM

## 2013-01-12 DIAGNOSIS — J810 Acute pulmonary edema: Secondary | ICD-10-CM

## 2013-01-12 LAB — NMR LIPOPROFILE WITH LIPIDS
Cholesterol, Total: 110 mg/dL (ref <200)
HDL Particle Number: 15.5 umol/L — ABNORMAL LOW (ref >=30.5)
HDL Size: 10.3 nm (ref >=9.2)
HDL-C: 31 mg/dL — ABNORMAL LOW (ref >=40)
LDL (calc): 37 mg/dL (ref <100)
LDL Particle Number: 914 nmol/L (ref <1000)
LDL Size: 20.7 nm (ref >20.5)
LP-IR Score: 60 — ABNORMAL HIGH (ref <=45)
Large HDL-P: 7.9 umol/L (ref >=4.8)
Large VLDL-P: 9.4 nmol/L — ABNORMAL HIGH (ref <=2.7)
Small LDL Particle Number: 363 nmol/L (ref <=527)
Triglycerides: 212 mg/dL — ABNORMAL HIGH (ref <150)
VLDL Size: 61.2 nm — ABNORMAL HIGH (ref <=46.6)

## 2013-01-12 LAB — COMPLETE METABOLIC PANEL WITH GFR
ALT: 27 U/L (ref 0–53)
AST: 42 U/L — ABNORMAL HIGH (ref 0–37)
Albumin: 3.3 g/dL — ABNORMAL LOW (ref 3.5–5.2)
Alkaline Phosphatase: 97 U/L (ref 39–117)
BUN: 25 mg/dL — ABNORMAL HIGH (ref 6–23)
CO2: 31 mEq/L (ref 19–32)
Calcium: 8.6 mg/dL (ref 8.4–10.5)
Chloride: 95 mEq/L — ABNORMAL LOW (ref 96–112)
Creat: 1.66 mg/dL — ABNORMAL HIGH (ref 0.50–1.35)
GFR, Est African American: 56 mL/min — ABNORMAL LOW
GFR, Est Non African American: 49 mL/min — ABNORMAL LOW
Glucose, Bld: 323 mg/dL — ABNORMAL HIGH (ref 70–99)
Potassium: 4 mEq/L (ref 3.5–5.3)
Sodium: 136 mEq/L (ref 135–145)
Total Bilirubin: 0.6 mg/dL (ref 0.3–1.2)
Total Protein: 7.1 g/dL (ref 6.0–8.3)

## 2013-01-12 LAB — VITAMIN B12: Vitamin B-12: 1319 pg/mL — ABNORMAL HIGH (ref 211–911)

## 2013-01-12 LAB — POCT INR: INR: 1.9

## 2013-01-13 LAB — TESTOSTERONE, TOTAL AND FREE DIRECT MEASURE
Free Testosterone, Direct: 6.1 pg/mL (ref 3.8–34.2)
Testosterone: 75 ng/dL — ABNORMAL LOW (ref 300–890)

## 2013-01-16 ENCOUNTER — Telehealth: Payer: Self-pay | Admitting: General Practice

## 2013-01-16 ENCOUNTER — Telehealth: Payer: Self-pay | Admitting: Pharmacist

## 2013-01-16 MED ORDER — BUSPIRONE HCL 15 MG PO TABS: 15.0000 mg | ORAL_TABLET | Freq: Two times a day (BID) | ORAL | Status: DC

## 2013-01-16 MED ORDER — INSULIN GLARGINE 100 UNIT/ML ~~LOC~~ SOLN: 110.0000 [IU] | Freq: Every day | SUBCUTANEOUS | Status: DC

## 2013-01-16 MED ORDER — GLUCOCOM LANCETS 33G MISC: Status: DC

## 2013-01-16 MED ORDER — INSULIN ASPART 100 UNIT/ML ~~LOC~~ SOLN: 2.0000 [IU] | Freq: Three times a day (TID) | SUBCUTANEOUS | Status: DC

## 2013-01-16 MED ORDER — INSULIN SYRINGES (DISPOSABLE) U-100 1 ML MISC: Status: DC

## 2013-01-16 NOTE — Telephone Encounter (Signed)
[facility]ed [facility] - Rx for lancets has already been faxed.  Also Rx was sent in to [facility] in June 2014 for this patient's test strip - that Rx had diagnosis code and instruction on how often to check BG.  [facility] should [facility] RX transferred from [place].

## 2013-01-16 NOTE — Telephone Encounter (Signed)
Some of the Prescriptions sent in to [facility] on 01/11/13 were not for 30 days supply.  These include lantus, Novolog and Buspar.  Correctect Rx's and [facility].  Also Rx's for syringes and lancets needed diagnosis code and to be faxed not called in - taken care of .

## 2013-01-17 ENCOUNTER — Ambulatory Visit (HOSPITAL_COMMUNITY): Payer: Medicare Other

## 2013-01-17 NOTE — Telephone Encounter (Signed)
Pt aware.

## 2013-01-18 ENCOUNTER — Other Ambulatory Visit: Payer: Self-pay

## 2013-01-18 DIAGNOSIS — G4700 Insomnia, unspecified: Secondary | ICD-10-CM

## 2013-01-18 MED ORDER — PREGABALIN 50 MG PO CAPS: 50.0000 mg | ORAL_CAPSULE | Freq: Three times a day (TID) | ORAL | Status: DC

## 2013-01-18 MED ORDER — ZOLPIDEM TARTRATE 5 MG PO TABS: 2.5000 mg | ORAL_TABLET | Freq: Every evening | ORAL | Status: DC | PRN

## 2013-01-18 MED ORDER — WARFARIN SODIUM 5 MG PO TABS: 7.5000 mg | ORAL_TABLET | Freq: Every day | ORAL | Status: DC

## 2013-01-18 NOTE — Telephone Encounter (Signed)
Ambien says under medications that it was printed on 01/11/13 with 3 RF's  Nothing in box up front and no copy of patient picking it up.   If approved print and have nurse notify patient

## 2013-01-19 ENCOUNTER — Ambulatory Visit (INDEPENDENT_AMBULATORY_CARE_PROVIDER_SITE_OTHER): Payer: Medicare Other | Admitting: Pharmacist

## 2013-01-19 DIAGNOSIS — J810 Acute pulmonary edema: Secondary | ICD-10-CM

## 2013-01-19 DIAGNOSIS — I82409 Acute embolism and thrombosis of unspecified deep veins of unspecified lower extremity: Secondary | ICD-10-CM

## 2013-01-19 LAB — POCT INR: INR: 1.9

## 2013-01-20 ENCOUNTER — Ambulatory Visit (HOSPITAL_COMMUNITY)
Admission: RE | Admit: 2013-01-20 | Discharge: 2013-01-20 | Disposition: A | Discharge status: Home / Self Care | Payer: Medicare Other | Source: Ambulatory Visit | Attending: General Practice | Admitting: General Practice

## 2013-01-20 DIAGNOSIS — R161 Splenomegaly, not elsewhere classified: Secondary | ICD-10-CM | POA: Insufficient documentation

## 2013-01-20 DIAGNOSIS — N269 Renal sclerosis, unspecified: Secondary | ICD-10-CM | POA: Insufficient documentation

## 2013-01-20 DIAGNOSIS — R1900 Intra-abdominal and pelvic swelling, mass and lump, unspecified site: Secondary | ICD-10-CM | POA: Insufficient documentation

## 2013-01-23 ENCOUNTER — Other Ambulatory Visit: Payer: Self-pay | Admitting: General Practice

## 2013-01-23 DIAGNOSIS — Z79899 Other long term (current) drug therapy: Secondary | ICD-10-CM

## 2013-01-25 ENCOUNTER — Other Ambulatory Visit (INDEPENDENT_AMBULATORY_CARE_PROVIDER_SITE_OTHER): Payer: Medicare Other

## 2013-01-25 DIAGNOSIS — Z79899 Other long term (current) drug therapy: Secondary | ICD-10-CM

## 2013-01-26 ENCOUNTER — Other Ambulatory Visit: Payer: Self-pay | Admitting: General Practice

## 2013-01-26 LAB — PSA, TOTAL AND FREE
PSA, Free Pct: >10 %
PSA, Free: 0.01 ng/mL
PSA: <0.1 ng/mL (ref 0.0–4.0)

## 2013-01-27 NOTE — Progress Notes (Signed)
Patient came in for labs only.

## 2013-02-02 ENCOUNTER — Ambulatory Visit (INDEPENDENT_AMBULATORY_CARE_PROVIDER_SITE_OTHER): Payer: Medicare Other | Admitting: Pharmacist

## 2013-02-02 DIAGNOSIS — J810 Acute pulmonary edema: Secondary | ICD-10-CM

## 2013-02-02 DIAGNOSIS — I82409 Acute embolism and thrombosis of unspecified deep veins of unspecified lower extremity: Secondary | ICD-10-CM

## 2013-02-02 LAB — POCT INR: INR: 2.5

## 2013-02-08 ENCOUNTER — Telehealth: Payer: Self-pay | Admitting: General Practice

## 2013-02-09 ENCOUNTER — Telehealth: Payer: Self-pay | Admitting: Pharmacist

## 2013-02-09 ENCOUNTER — Other Ambulatory Visit: Payer: Self-pay | Admitting: General Practice

## 2013-02-09 ENCOUNTER — Ambulatory Visit (INDEPENDENT_AMBULATORY_CARE_PROVIDER_SITE_OTHER): Payer: Medicare Other | Admitting: Pharmacist

## 2013-02-09 DIAGNOSIS — J810 Acute pulmonary edema: Secondary | ICD-10-CM

## 2013-02-09 DIAGNOSIS — R7989 Other specified abnormal findings of blood chemistry: Secondary | ICD-10-CM

## 2013-02-09 DIAGNOSIS — N400 Enlarged prostate without lower urinary tract symptoms: Secondary | ICD-10-CM

## 2013-02-09 DIAGNOSIS — E291 Testicular hypofunction: Secondary | ICD-10-CM

## 2013-02-09 DIAGNOSIS — G894 Chronic pain syndrome: Secondary | ICD-10-CM

## 2013-02-09 DIAGNOSIS — I82409 Acute embolism and thrombosis of unspecified deep veins of unspecified lower extremity: Secondary | ICD-10-CM

## 2013-02-09 LAB — POCT INR: INR: 2.1

## 2013-02-09 MED ORDER — FENTANYL 75 MCG/HR TD PT72: 1.0000 | MEDICATED_PATCH | TRANSDERMAL | Status: DC

## 2013-02-09 MED ORDER — TAMSULOSIN HCL 0.4 MG PO CAPS: 0.4000 mg | ORAL_CAPSULE | Freq: Every day | ORAL | Status: DC

## 2013-02-09 MED ORDER — HYDROCODONE-ACETAMINOPHEN 7.5-325 MG PO TABS: 1.0000 | ORAL_TABLET | Freq: Every day | ORAL | Status: DC

## 2013-02-09 MED ORDER — TESTOSTERONE 50 MG/5GM (1%) TD GEL: 5.0000 g | Freq: Every day | TRANSDERMAL | Status: DC

## 2013-02-09 NOTE — Telephone Encounter (Signed)
Pt aware, rx for Norco is ready.

## 2013-02-09 NOTE — Progress Notes (Signed)
Spoke with patient and he verbalized understanding of how to apply androgel, properly and its side effects. Recheck testosterone level in one month. He also verbalized understanding that he should continue taking flomax  until cialis is approved by insurance (if approved).

## 2013-02-10 NOTE — Telephone Encounter (Signed)
Done 02/09/13

## 2013-02-13 ENCOUNTER — Telehealth: Payer: Self-pay | Admitting: General Practice

## 2013-02-14 ENCOUNTER — Telehealth: Payer: Self-pay | Admitting: *Deleted

## 2013-02-14 ENCOUNTER — Other Ambulatory Visit: Payer: Self-pay

## 2013-02-14 MED ORDER — INSULIN ASPART 100 UNIT/ML ~~LOC~~ SOLN: 2.0000 [IU] | Freq: Three times a day (TID) | SUBCUTANEOUS | Status: DC

## 2013-02-14 MED ORDER — INSULIN GLARGINE 100 UNIT/ML ~~LOC~~ SOLN: 110.0000 [IU] | Freq: Every day | SUBCUTANEOUS | Status: DC

## 2013-02-14 NOTE — Telephone Encounter (Signed)
xlast seen 01/11/13  Mae  Last filled 01/18/13  If approved print and route to nurse

## 2013-02-14 NOTE — Telephone Encounter (Signed)
Pt aware, rx for Androgel ready for pick up.  He needs [facility] Invokana 100mg  gd.

## 2013-02-15 ENCOUNTER — Other Ambulatory Visit: Payer: Self-pay | Admitting: General Practice

## 2013-02-15 ENCOUNTER — Telehealth: Payer: Self-pay | Admitting: General Practice

## 2013-02-15 DIAGNOSIS — E119 Type 2 diabetes mellitus without complications: Secondary | ICD-10-CM

## 2013-02-15 MED ORDER — PREGABALIN 50 MG PO CAPS: 50.0000 mg | ORAL_CAPSULE | Freq: Three times a day (TID) | ORAL | Status: DC

## 2013-02-15 MED ORDER — CANAGLIFLOZIN 100 MG PO TABS: 1.0000 | ORAL_TABLET | Freq: Every day | ORAL | Status: DC

## 2013-02-15 NOTE — Telephone Encounter (Signed)
Please inform that script sent to pharmacy for [place]. thx

## 2013-02-15 NOTE — Telephone Encounter (Signed)
Spoke with patient and he reports blood sugar has ranged 350-389. He reports blood sugar is currently 389. Patient instructed to administer himself 15 units of novolog, then recheck blood sugar in one hour and call office with results. He reports eating healthy meals, denies dizziness, nausea. He is to call with blood sugar results.

## 2013-02-15 NOTE — Telephone Encounter (Signed)
Please advise 

## 2013-02-15 NOTE — Telephone Encounter (Signed)
Script for lyrica 50mg  [facility] (90 with 3 refills per M.[place]). Pt aware.

## 2013-02-16 ENCOUNTER — Ambulatory Visit: Payer: Self-pay | Admitting: Pharmacist

## 2013-02-16 DIAGNOSIS — I82409 Acute embolism and thrombosis of unspecified deep veins of unspecified lower extremity: Secondary | ICD-10-CM

## 2013-02-16 DIAGNOSIS — J810 Acute pulmonary edema: Secondary | ICD-10-CM

## 2013-02-16 LAB — POCT INR: INR: 2.4

## 2013-02-16 NOTE — Progress Notes (Signed)
BG was 220 this am but has been increasing throughout the day.  I instructed patient to increase lantus to 65 units BID (was taking 110 units daily).  He is to call if BG does not improve for further adjustment.

## 2013-02-16 NOTE — Telephone Encounter (Signed)
Spoke with patient on 02/15/13 at 5:50pm and he reported blood sugar to be 332. Instructed patient to administer another 15 units of novolog and recheck in one hour, then call office with results. Patient returned call @ 7:00pm with blood sugar reading of 290.  Patient to resume normal regime of insulin and blood sugar checks, he had scheduled dose of insulin due at midnight. Patient verbalized understanding.

## 2013-02-21 ENCOUNTER — Other Ambulatory Visit: Payer: Self-pay

## 2013-02-21 MED ORDER — BUSPIRONE HCL 15 MG PO TABS: 15.0000 mg | ORAL_TABLET | Freq: Two times a day (BID) | ORAL | Status: DC

## 2013-02-21 MED ORDER — WARFARIN SODIUM 5 MG PO TABS: 7.5000 mg | ORAL_TABLET | Freq: Every day | ORAL | Status: DC

## 2013-02-22 ENCOUNTER — Other Ambulatory Visit: Payer: Self-pay

## 2013-02-22 MED ORDER — WARFARIN SODIUM 5 MG PO TABS: 7.5000 mg | ORAL_TABLET | Freq: Every day | ORAL | Status: DC

## 2013-02-22 MED ORDER — SIMVASTATIN 20 MG PO TABS: 20.0000 mg | ORAL_TABLET | Freq: Every day | ORAL | Status: DC

## 2013-02-22 MED ORDER — ESOMEPRAZOLE MAGNESIUM 40 MG PO CPDR: 40.0000 mg | DELAYED_RELEASE_CAPSULE | Freq: Every day | ORAL | Status: DC

## 2013-02-22 MED ORDER — INSULIN ASPART 100 UNIT/ML ~~LOC~~ SOLN: 2.0000 [IU] | Freq: Three times a day (TID) | SUBCUTANEOUS | Status: DC

## 2013-02-22 MED ORDER — CITALOPRAM HYDROBROMIDE 40 MG PO TABS: 60.0000 mg | ORAL_TABLET | Freq: Every day | ORAL | Status: DC

## 2013-02-22 MED ORDER — POTASSIUM CHLORIDE ER 10 MEQ PO TBCR: 30.0000 meq | EXTENDED_RELEASE_TABLET | Freq: Every day | ORAL | Status: DC

## 2013-02-22 MED ORDER — BUSPIRONE HCL 15 MG PO TABS: 15.0000 mg | ORAL_TABLET | Freq: Two times a day (BID) | ORAL | Status: DC

## 2013-02-23 ENCOUNTER — Telehealth: Payer: Self-pay | Admitting: Pharmacist

## 2013-02-23 ENCOUNTER — Ambulatory Visit (INDEPENDENT_AMBULATORY_CARE_PROVIDER_SITE_OTHER): Payer: Medicare Other | Admitting: Pharmacist

## 2013-02-23 DIAGNOSIS — E139 Other specified diabetes mellitus without complications: Secondary | ICD-10-CM

## 2013-02-23 DIAGNOSIS — J810 Acute pulmonary edema: Secondary | ICD-10-CM

## 2013-02-23 DIAGNOSIS — I82409 Acute embolism and thrombosis of unspecified deep veins of unspecified lower extremity: Secondary | ICD-10-CM

## 2013-02-23 LAB — POCT INR: INR: 2.2

## 2013-02-23 MED ORDER — "PEN NEEDLES 5/16"" 31G X 8 MM MISC": 1.0000 | Freq: Three times a day (TID) | Status: DC

## 2013-02-23 MED ORDER — INSULIN GLULISINE 100 UNIT/ML SOLOSTAR PEN: 18.0000 [IU] | PEN_INJECTOR | Freq: Three times a day (TID) | SUBCUTANEOUS | Status: DC

## 2013-02-23 NOTE — Telephone Encounter (Signed)
BG still above 200 regularly. Current lantus dose is 65 units BID and Novolog 3 unit for each 50 point BG is greater than 150.   He has been giving 15 units prior to each meal recently and BG still not improving.  Increase Lantus to 70 units BID and will change Novolog to Apidra 18 before each meal.

## 2013-03-02 ENCOUNTER — Ambulatory Visit (INDEPENDENT_AMBULATORY_CARE_PROVIDER_SITE_OTHER): Payer: Medicare Other | Admitting: Pharmacist

## 2013-03-02 ENCOUNTER — Telehealth: Payer: Self-pay | Admitting: Pharmacist

## 2013-03-02 DIAGNOSIS — J810 Acute pulmonary edema: Secondary | ICD-10-CM

## 2013-03-02 DIAGNOSIS — I82409 Acute embolism and thrombosis of unspecified deep veins of unspecified lower extremity: Secondary | ICD-10-CM

## 2013-03-02 LAB — POCT INR: INR: 2.8

## 2013-03-02 LAB — PROTIME-INR

## 2013-03-02 MED ORDER — INSULIN ASPART 100 UNIT/ML FLEXPEN: 30.0000 [IU] | PEN_INJECTOR | Freq: Three times a day (TID) | SUBCUTANEOUS | Status: DC

## 2013-03-02 MED ORDER — INSULIN GLARGINE 100 UNIT/ML SOLOSTAR PEN: 80.0000 [IU] | PEN_INJECTOR | Freq: Two times a day (BID) | SUBCUTANEOUS | Status: DC

## 2013-03-02 NOTE — Telephone Encounter (Signed)
Patient is currently taking Lantus 70 units twice a day And novolog 30 units prior to meals.   Still having BG readings in 200's  Apidra is not covered by insurance.   Recommend increase Lantus to 80 units twice a day And change Apidra to Novolog 30 units prior to meals.

## 2013-03-02 NOTE — Progress Notes (Signed)
Charge only for INR homo interrprutation. No charge for INR - was performed by patient at home

## 2013-03-09 ENCOUNTER — Ambulatory Visit: Payer: Self-pay | Admitting: Pharmacist

## 2013-03-09 ENCOUNTER — Telehealth: Payer: Self-pay | Admitting: Pharmacist

## 2013-03-09 DIAGNOSIS — I82409 Acute embolism and thrombosis of unspecified deep veins of unspecified lower extremity: Secondary | ICD-10-CM

## 2013-03-09 DIAGNOSIS — J810 Acute pulmonary edema: Secondary | ICD-10-CM

## 2013-03-09 DIAGNOSIS — G894 Chronic pain syndrome: Secondary | ICD-10-CM

## 2013-03-09 LAB — POCT INR: INR: 2.7

## 2013-03-09 MED ORDER — FENTANYL 75 MCG/HR TD PT72: 1.0000 | MEDICATED_PATCH | TRANSDERMAL | Status: DC

## 2013-03-09 MED ORDER — HYDROCODONE-ACETAMINOPHEN 7.5-325 MG PO TABS: 1.0000 | ORAL_TABLET | Freq: Every day | ORAL | Status: DC

## 2013-03-09 NOTE — Telephone Encounter (Signed)
Increase Lantus to 90 units twice a day

## 2013-03-10 NOTE — Telephone Encounter (Signed)
pts wife picked up both scripts

## 2013-03-14 ENCOUNTER — Telehealth: Payer: Self-pay | Admitting: *Deleted

## 2013-03-14 NOTE — Telephone Encounter (Signed)
There is already a prior authorization for citalopram 20 mg tid if he can take 3 at hs otherwise we will need to do another prior authorization.  I will do whatever you advise. Thanks in advance.

## 2013-03-14 NOTE — Telephone Encounter (Signed)
Let patien tknow he has approval for celexa 20 TID for total of 60mg  a day is that okay instead of 40 mg  1 1/2 BID.

## 2013-03-16 ENCOUNTER — Telehealth: Payer: Self-pay | Admitting: Nurse Practitioner

## 2013-03-16 ENCOUNTER — Ambulatory Visit (INDEPENDENT_AMBULATORY_CARE_PROVIDER_SITE_OTHER): Payer: Medicaid Other | Admitting: Nurse Practitioner

## 2013-03-16 DIAGNOSIS — J810 Acute pulmonary edema: Secondary | ICD-10-CM

## 2013-03-16 DIAGNOSIS — E139 Other specified diabetes mellitus without complications: Secondary | ICD-10-CM

## 2013-03-16 DIAGNOSIS — I82409 Acute embolism and thrombosis of unspecified deep veins of unspecified lower extremity: Secondary | ICD-10-CM

## 2013-03-16 LAB — PROTIME-INR
INR: 3.1 — AB (ref <=1.1)
INR: 31 — AB (ref <=1.1)

## 2013-03-16 MED ORDER — "PEN NEEDLES 5/16"" 31G X 8 MM MISC": Status: DC

## 2013-03-16 NOTE — Telephone Encounter (Signed)
Patient is aware he needs some pen needles refilled until [facility]

## 2013-03-16 NOTE — Telephone Encounter (Signed)
INR 3.1

## 2013-03-16 NOTE — Telephone Encounter (Signed)
Hold coumadin today then continue current dose

## 2013-03-23 ENCOUNTER — Ambulatory Visit (INDEPENDENT_AMBULATORY_CARE_PROVIDER_SITE_OTHER): Payer: Medicaid Other | Admitting: Pharmacist

## 2013-03-23 DIAGNOSIS — J810 Acute pulmonary edema: Secondary | ICD-10-CM

## 2013-03-23 DIAGNOSIS — I82409 Acute embolism and thrombosis of unspecified deep veins of unspecified lower extremity: Secondary | ICD-10-CM

## 2013-03-23 LAB — POCT INR: INR: 2.6

## 2013-03-23 LAB — PROTIME-INR

## 2013-03-23 NOTE — Telephone Encounter (Signed)
Rece notified

## 2013-03-23 NOTE — Progress Notes (Signed)
No charge for INR or visit - patient checks at home through [facility].

## 2013-03-30 ENCOUNTER — Ambulatory Visit (INDEPENDENT_AMBULATORY_CARE_PROVIDER_SITE_OTHER): Payer: Medicare Other | Admitting: Pharmacist

## 2013-03-30 DIAGNOSIS — J810 Acute pulmonary edema: Secondary | ICD-10-CM

## 2013-03-30 DIAGNOSIS — I82409 Acute embolism and thrombosis of unspecified deep veins of unspecified lower extremity: Secondary | ICD-10-CM

## 2013-03-30 LAB — POCT INR: INR: 2.6

## 2013-03-30 NOTE — Progress Notes (Signed)
Patient checks INR at home with Home INR monitoring.  Billing once per month interupertation fee.  Patient diagnosis - venous thromboembolism. / DVT/PE Procedure code if [id number]

## 2013-04-06 ENCOUNTER — Ambulatory Visit (INDEPENDENT_AMBULATORY_CARE_PROVIDER_SITE_OTHER): Payer: Medicare Other | Admitting: Pharmacist

## 2013-04-06 DIAGNOSIS — J810 Acute pulmonary edema: Secondary | ICD-10-CM

## 2013-04-06 DIAGNOSIS — I82409 Acute embolism and thrombosis of unspecified deep veins of unspecified lower extremity: Secondary | ICD-10-CM

## 2013-04-06 LAB — POCT INR: INR: 2.9

## 2013-04-13 ENCOUNTER — Ambulatory Visit (INDEPENDENT_AMBULATORY_CARE_PROVIDER_SITE_OTHER): Payer: Medicare Other | Admitting: Pharmacist

## 2013-04-13 DIAGNOSIS — I82409 Acute embolism and thrombosis of unspecified deep veins of unspecified lower extremity: Secondary | ICD-10-CM

## 2013-04-13 DIAGNOSIS — J810 Acute pulmonary edema: Secondary | ICD-10-CM

## 2013-04-13 LAB — POCT INR: INR: 2.8

## 2013-04-13 NOTE — Progress Notes (Signed)
No charge for labs or visit - INR check through home monitoring system  

## 2013-04-14 ENCOUNTER — Ambulatory Visit (INDEPENDENT_AMBULATORY_CARE_PROVIDER_SITE_OTHER): Payer: Medicare Other | Admitting: General Practice

## 2013-04-14 ENCOUNTER — Encounter: Payer: Self-pay | Admitting: General Practice

## 2013-04-14 VITALS — BP 116/73 | HR 66 | Temp 98.4°F

## 2013-04-14 DIAGNOSIS — F32A Depression, unspecified: Secondary | ICD-10-CM

## 2013-04-14 DIAGNOSIS — J069 Acute upper respiratory infection, unspecified: Secondary | ICD-10-CM

## 2013-04-14 DIAGNOSIS — F3289 Other specified depressive episodes: Secondary | ICD-10-CM

## 2013-04-14 DIAGNOSIS — G894 Chronic pain syndrome: Secondary | ICD-10-CM

## 2013-04-14 DIAGNOSIS — E119 Type 2 diabetes mellitus without complications: Secondary | ICD-10-CM

## 2013-04-14 DIAGNOSIS — E349 Endocrine disorder, unspecified: Secondary | ICD-10-CM

## 2013-04-14 DIAGNOSIS — F329 Major depressive disorder, single episode, unspecified: Secondary | ICD-10-CM

## 2013-04-14 DIAGNOSIS — E291 Testicular hypofunction: Secondary | ICD-10-CM

## 2013-04-14 LAB — POCT GLYCOSYLATED HEMOGLOBIN (HGB A1C): Hemoglobin A1C: 7.8

## 2013-04-14 MED ORDER — HYDROCODONE-ACETAMINOPHEN 7.5-325 MG PO TABS: 1.0000 | ORAL_TABLET | Freq: Every day | ORAL | Status: DC

## 2013-04-14 MED ORDER — FENTANYL 75 MCG/HR TD PT72: 1.0000 | MEDICATED_PATCH | TRANSDERMAL | Status: DC

## 2013-04-14 MED ORDER — INSULIN GLARGINE 100 UNIT/ML SOLOSTAR PEN: 95.0000 [IU] | PEN_INJECTOR | Freq: Two times a day (BID) | SUBCUTANEOUS | Status: DC

## 2013-04-14 MED ORDER — CITALOPRAM HYDROBROMIDE 20 MG PO TABS: ORAL_TABLET | ORAL | Status: DC

## 2013-04-14 MED ORDER — AZITHROMYCIN 250 MG PO TABS: ORAL_TABLET | ORAL | Status: DC

## 2013-04-14 NOTE — Patient Instructions (Signed)

## 2013-04-14 NOTE — Progress Notes (Signed)
  Subjective:    Patient ID: [name], male    DOB: 1965-07-24, 47 y.o.   MRN: [id number]  HPI Patient presents today for follow up of chronic health conditions. He has a history of diabetes, BPH, seizures, neuropathy, chronic pain (back, knees, shoulders), depression, bipolar. He reports taking medications as directed. Blood sugars checking blood sugars at home and range 150's. He denies muscle aches.      Review of Systems  Constitutional: Negative for fever and chills.  HENT: Negative for facial swelling.   Respiratory: Negative for cough, chest tightness and shortness of breath.   Cardiovascular: Negative for chest pain and palpitations.  Gastrointestinal: Negative for nausea, vomiting, abdominal pain, diarrhea, constipation and blood in stool.  Genitourinary: Negative for flank pain and difficulty urinating.  Musculoskeletal: Positive for back pain.       Chronic back pain  Neurological: Negative for dizziness, weakness and headaches.       Objective:   Physical Exam  Constitutional: He is oriented to person, place, and time. He appears well-developed and well-nourished.  HENT:  Head: Normocephalic and atraumatic.  Right Ear: External ear normal.  Left Ear: External ear normal.  Mouth/Throat: Oropharynx is clear and moist.  Eyes: Conjunctivae and EOM are normal.  Neck: Normal range of motion. Neck supple. No JVD present. No thyromegaly present.  Cardiovascular: Normal rate, regular rhythm and normal heart sounds.   Pulmonary/Chest: Effort normal and breath sounds normal. No respiratory distress. He exhibits no tenderness.  Abdominal: Soft. Bowel sounds are normal. He exhibits no distension. There is no tenderness. There is no CVA tenderness, no tenderness at McBurney's point and negative Murphy's sign.  Obese abdomen   Neurological: He is alert and oriented to person, place, and time.  Skin: Skin is warm and dry.  Psychiatric: He has a normal mood and affect.           Assessment & Plan:  1. Chronic pain syndrome  - fentaNYL (DURAGESIC) 75 MCG/HR; Place 1 patch (75 mcg total) onto the skin every 3 (three) days.  Dispense: 10 patch; Refill: 0 - HYDROcodone-acetaminophen (NORCO) 7.5-325 MG per tablet; Take 1 tablet by mouth daily. As needed for BTP (1 tablet per day)  Dispense: 30 tablet; Refill: 0  2. Depression  - citalopram (CELEXA) 20 MG tablet; Take 3 tablets at bedtime (60 mg total)  Dispense: 90 tablet; Refill: 0  3. Diabetes  - Insulin Glargine (LANTUS SOLOSTAR) 100 UNIT/ML SOPN; Inject 95 Units into the skin 2 (two) times daily.  Dispense: 80 mL; Refill: 3 - CMP14+EGFR - POCT glycosylated hemoglobin (Hb A1C)  4. Upper respiratory infection  - azithromycin (ZITHROMAX) 250 MG tablet; Take as directed  Dispense: 6 tablet; Refill: 0  5. Testosterone deficiency  - Testosterone,Free and Total -Continue all current medications Labs pending F/u in 3 months Discussed exercise and diet  Patient verbalized understanding Erby Pian, FNP-C

## 2013-04-17 LAB — TESTOSTERONE,FREE AND TOTAL
Testosterone, Free: 2.6 pg/mL — ABNORMAL LOW (ref 6.8–21.5)
Testosterone: 223 ng/dL — ABNORMAL LOW (ref 348–1197)

## 2013-04-17 LAB — CMP14+EGFR
ALT: 21 IU/L (ref 0–44)
AST: 31 IU/L (ref 0–40)
Albumin/Globulin Ratio: 1 — ABNORMAL LOW (ref 1.1–2.5)
Albumin: 3.7 g/dL (ref 3.5–5.5)
Alkaline Phosphatase: 99 IU/L (ref 39–117)
BUN/Creatinine Ratio: 19 (ref 9–20)
BUN: 32 mg/dL — ABNORMAL HIGH (ref 6–24)
CO2: 28 mmol/L (ref 18–29)
Calcium: 9.1 mg/dL (ref 8.7–10.2)
Chloride: 93 mmol/L — ABNORMAL LOW (ref 97–108)
Creatinine, Ser: 1.68 mg/dL — ABNORMAL HIGH (ref 0.76–1.27)
GFR calc Af Amer: 55 mL/min/{1.73_m2} — ABNORMAL LOW (ref >59)
GFR calc non Af Amer: 48 mL/min/{1.73_m2} — ABNORMAL LOW (ref >59)
Globulin, Total: 3.8 g/dL (ref 1.5–4.5)
Glucose: 151 mg/dL — ABNORMAL HIGH (ref 65–99)
Potassium: 4.2 mmol/L (ref 3.5–5.2)
Sodium: 140 mmol/L (ref 134–144)
Total Bilirubin: 0.6 mg/dL (ref 0.0–1.2)
Total Protein: 7.5 g/dL (ref 6.0–8.5)

## 2013-04-20 ENCOUNTER — Ambulatory Visit (INDEPENDENT_AMBULATORY_CARE_PROVIDER_SITE_OTHER): Payer: Medicaid Other | Admitting: Pharmacist

## 2013-04-20 DIAGNOSIS — J810 Acute pulmonary edema: Secondary | ICD-10-CM

## 2013-04-20 DIAGNOSIS — I82409 Acute embolism and thrombosis of unspecified deep veins of unspecified lower extremity: Secondary | ICD-10-CM

## 2013-04-20 LAB — POCT INR: INR: 4.2

## 2013-04-20 MED ORDER — EPINEPHRINE 0.3 MG/0.3ML IJ SOAJ: 0.3000 mg | Freq: Once | INTRAMUSCULAR | Status: DC

## 2013-04-20 NOTE — Progress Notes (Signed)
No charge for labs or visit - INR check through home monitoring system  

## 2013-04-25 ENCOUNTER — Encounter: Payer: Self-pay | Admitting: *Deleted

## 2013-04-25 ENCOUNTER — Other Ambulatory Visit: Payer: Self-pay | Admitting: General Practice

## 2013-04-27 ENCOUNTER — Ambulatory Visit (INDEPENDENT_AMBULATORY_CARE_PROVIDER_SITE_OTHER): Payer: Medicaid Other | Admitting: Pharmacist

## 2013-04-27 DIAGNOSIS — I82409 Acute embolism and thrombosis of unspecified deep veins of unspecified lower extremity: Secondary | ICD-10-CM

## 2013-04-27 DIAGNOSIS — J810 Acute pulmonary edema: Secondary | ICD-10-CM

## 2013-04-27 LAB — POCT INR: INR: 2.1

## 2013-04-27 NOTE — Progress Notes (Signed)
INR results faxed from home INR monitoring [place] - patient called with results - no charge for lab or visit

## 2013-05-02 ENCOUNTER — Other Ambulatory Visit: Payer: Self-pay

## 2013-05-02 MED ORDER — SIMVASTATIN 20 MG PO TABS: 20.0000 mg | ORAL_TABLET | Freq: Every day | ORAL | Status: DC

## 2013-05-03 ENCOUNTER — Ambulatory Visit (INDEPENDENT_AMBULATORY_CARE_PROVIDER_SITE_OTHER): Payer: Medicare Other | Admitting: Pharmacist

## 2013-05-03 DIAGNOSIS — J810 Acute pulmonary edema: Secondary | ICD-10-CM

## 2013-05-03 DIAGNOSIS — I82409 Acute embolism and thrombosis of unspecified deep veins of unspecified lower extremity: Secondary | ICD-10-CM

## 2013-05-03 LAB — POCT INR: INR: 3

## 2013-05-03 NOTE — Progress Notes (Signed)
Patient checks INR at home with Home INR monitoring.  Billing once per month interupertation fee.  Patient diagnosis - venous thromboembolism.  Procedure code if [id number]

## 2013-05-04 ENCOUNTER — Other Ambulatory Visit: Payer: Self-pay

## 2013-05-05 ENCOUNTER — Other Ambulatory Visit: Payer: Self-pay | Admitting: Nurse Practitioner

## 2013-05-08 ENCOUNTER — Other Ambulatory Visit: Payer: Self-pay

## 2013-05-08 DIAGNOSIS — E291 Testicular hypofunction: Secondary | ICD-10-CM

## 2013-05-08 NOTE — Telephone Encounter (Signed)
Last seen 04/14/13  MAE  IF approved route to nurse to [facility]

## 2013-05-09 ENCOUNTER — Other Ambulatory Visit: Payer: Self-pay

## 2013-05-09 DIAGNOSIS — E119 Type 2 diabetes mellitus without complications: Secondary | ICD-10-CM

## 2013-05-09 MED ORDER — CANAGLIFLOZIN 100 MG PO TABS: 1.0000 | ORAL_TABLET | Freq: Every day | ORAL | Status: DC

## 2013-05-10 ENCOUNTER — Other Ambulatory Visit: Payer: Self-pay | Admitting: General Practice

## 2013-05-10 DIAGNOSIS — E291 Testicular hypofunction: Secondary | ICD-10-CM

## 2013-05-10 MED ORDER — TESTOSTERONE 50 MG/5GM (1%) TD GEL: 5.0000 g | Freq: Every day | TRANSDERMAL | Status: DC

## 2013-05-10 NOTE — Telephone Encounter (Signed)
Please call into pharmacy

## 2013-05-11 ENCOUNTER — Telehealth: Payer: Self-pay | Admitting: General Practice

## 2013-05-11 ENCOUNTER — Ambulatory Visit (INDEPENDENT_AMBULATORY_CARE_PROVIDER_SITE_OTHER): Payer: Medicaid Other | Admitting: Pharmacist

## 2013-05-11 ENCOUNTER — Telehealth: Payer: Self-pay | Admitting: Pharmacist

## 2013-05-11 DIAGNOSIS — I82409 Acute embolism and thrombosis of unspecified deep veins of unspecified lower extremity: Secondary | ICD-10-CM

## 2013-05-11 DIAGNOSIS — J810 Acute pulmonary edema: Secondary | ICD-10-CM

## 2013-05-11 LAB — POCT INR: INR: 2.7

## 2013-05-11 MED ORDER — GLUCOSE BLOOD VI STRP: ORAL_STRIP | Status: DC

## 2013-05-11 NOTE — Telephone Encounter (Signed)
rx for test strip send by [facility].

## 2013-05-11 NOTE — Telephone Encounter (Signed)
[facility].

## 2013-05-12 ENCOUNTER — Other Ambulatory Visit: Payer: Self-pay | Admitting: General Practice

## 2013-05-12 ENCOUNTER — Other Ambulatory Visit: Payer: Self-pay | Admitting: *Deleted

## 2013-05-12 DIAGNOSIS — F329 Major depressive disorder, single episode, unspecified: Secondary | ICD-10-CM

## 2013-05-12 DIAGNOSIS — G894 Chronic pain syndrome: Secondary | ICD-10-CM

## 2013-05-12 DIAGNOSIS — F32A Depression, unspecified: Secondary | ICD-10-CM

## 2013-05-12 MED ORDER — METHOCARBAMOL 750 MG PO TABS: 750.0000 mg | ORAL_TABLET | Freq: Two times a day (BID) | ORAL | Status: DC | PRN

## 2013-05-12 MED ORDER — LAMOTRIGINE 200 MG PO TABS: 200.0000 mg | ORAL_TABLET | Freq: Two times a day (BID) | ORAL | Status: DC

## 2013-05-12 MED ORDER — SIMVASTATIN 20 MG PO TABS: 20.0000 mg | ORAL_TABLET | Freq: Every day | ORAL | Status: DC

## 2013-05-12 MED ORDER — FENTANYL 75 MCG/HR TD PT72: 75.0000 ug | MEDICATED_PATCH | TRANSDERMAL | Status: DC

## 2013-05-12 MED ORDER — HYDROCODONE-ACETAMINOPHEN 7.5-325 MG PO TABS: 1.0000 | ORAL_TABLET | Freq: Every day | ORAL | Status: DC

## 2013-05-12 MED ORDER — ESOMEPRAZOLE MAGNESIUM 40 MG PO CPDR: 40.0000 mg | DELAYED_RELEASE_CAPSULE | Freq: Every day | ORAL | Status: DC

## 2013-05-12 MED ORDER — CITALOPRAM HYDROBROMIDE 20 MG PO TABS: ORAL_TABLET | ORAL | Status: DC

## 2013-05-12 NOTE — Telephone Encounter (Signed)
Pt aware, rx for fentanyl patch and norco available for pick up at our office.

## 2013-05-12 NOTE — Telephone Encounter (Signed)
Scripts printed and ready for pick up

## 2013-05-15 ENCOUNTER — Other Ambulatory Visit: Payer: Self-pay | Admitting: *Deleted

## 2013-05-15 MED ORDER — GLUCOSE BLOOD VI STRP: ORAL_STRIP | Status: DC

## 2013-05-18 ENCOUNTER — Ambulatory Visit (INDEPENDENT_AMBULATORY_CARE_PROVIDER_SITE_OTHER): Payer: Medicare Other | Admitting: Pharmacist

## 2013-05-18 DIAGNOSIS — J810 Acute pulmonary edema: Secondary | ICD-10-CM

## 2013-05-18 DIAGNOSIS — I82409 Acute embolism and thrombosis of unspecified deep veins of unspecified lower extremity: Secondary | ICD-10-CM

## 2013-05-18 LAB — POCT INR: INR: 2.9

## 2013-05-24 ENCOUNTER — Ambulatory Visit (INDEPENDENT_AMBULATORY_CARE_PROVIDER_SITE_OTHER): Payer: Medicaid Other | Admitting: Pharmacist

## 2013-05-24 DIAGNOSIS — J810 Acute pulmonary edema: Secondary | ICD-10-CM

## 2013-05-24 DIAGNOSIS — I82409 Acute embolism and thrombosis of unspecified deep veins of unspecified lower extremity: Secondary | ICD-10-CM

## 2013-05-24 LAB — POCT INR: INR: 3.7

## 2013-05-24 NOTE — Progress Notes (Signed)
Received correspondence from insurance company regarding changing Nexium to omeprazole or pantoprazole.  Patient has taken both of these PPI's, multiple doses daily and did not received adequate acid suppression. He is doing well with Nexium .  Also correspondence suggested change from Lyrica to gabapentin.  Patient has also previously taking gabapentin with suboptimal resolution of neuropathy.  Will continue Lyrica.

## 2013-05-30 LAB — POCT INR: INR: 2.2

## 2013-05-31 ENCOUNTER — Ambulatory Visit: Payer: Medicare Other

## 2013-05-31 ENCOUNTER — Ambulatory Visit (INDEPENDENT_AMBULATORY_CARE_PROVIDER_SITE_OTHER): Payer: Medicare Other | Admitting: Pharmacist

## 2013-05-31 DIAGNOSIS — J810 Acute pulmonary edema: Secondary | ICD-10-CM

## 2013-05-31 DIAGNOSIS — I82409 Acute embolism and thrombosis of unspecified deep veins of unspecified lower extremity: Secondary | ICD-10-CM

## 2013-05-31 NOTE — Progress Notes (Signed)
Patient checks INR at home with Home INR monitoring.  Billing once per month interupertation fee.  Patient diagnosis - history of DVT/PE Procedure code if G0250

## 2013-06-05 ENCOUNTER — Other Ambulatory Visit: Payer: Self-pay | Admitting: Family Medicine

## 2013-06-05 ENCOUNTER — Other Ambulatory Visit: Payer: Self-pay | Admitting: Nurse Practitioner

## 2013-06-07 LAB — POCT INR: INR: 2.1

## 2013-06-08 ENCOUNTER — Other Ambulatory Visit: Payer: Self-pay | Admitting: *Deleted

## 2013-06-08 ENCOUNTER — Ambulatory Visit (INDEPENDENT_AMBULATORY_CARE_PROVIDER_SITE_OTHER): Payer: Medicare Other | Admitting: Pharmacist

## 2013-06-08 DIAGNOSIS — I82409 Acute embolism and thrombosis of unspecified deep veins of unspecified lower extremity: Secondary | ICD-10-CM

## 2013-06-08 DIAGNOSIS — F32A Depression, unspecified: Secondary | ICD-10-CM

## 2013-06-08 DIAGNOSIS — F329 Major depressive disorder, single episode, unspecified: Secondary | ICD-10-CM

## 2013-06-08 DIAGNOSIS — N400 Enlarged prostate without lower urinary tract symptoms: Secondary | ICD-10-CM

## 2013-06-08 DIAGNOSIS — J810 Acute pulmonary edema: Secondary | ICD-10-CM

## 2013-06-08 DIAGNOSIS — G894 Chronic pain syndrome: Secondary | ICD-10-CM

## 2013-06-08 MED ORDER — HYDROCODONE-ACETAMINOPHEN 7.5-325 MG PO TABS: 1.0000 | ORAL_TABLET | Freq: Every day | ORAL | Status: DC

## 2013-06-08 MED ORDER — FENTANYL 75 MCG/HR TD PT72: 75.0000 ug | MEDICATED_PATCH | TRANSDERMAL | Status: DC

## 2013-06-08 NOTE — Telephone Encounter (Signed)
Ambien last filled 05/07/13, last seen 10/13. Lyrica needs to be called into [facility]

## 2013-06-09 MED ORDER — CITALOPRAM HYDROBROMIDE 20 MG PO TABS: ORAL_TABLET | ORAL | Status: DC

## 2013-06-09 MED ORDER — METHOCARBAMOL 750 MG PO TABS: 750.0000 mg | ORAL_TABLET | Freq: Two times a day (BID) | ORAL | Status: DC | PRN

## 2013-06-09 MED ORDER — PREGABALIN 50 MG PO CAPS: 50.0000 mg | ORAL_CAPSULE | Freq: Three times a day (TID) | ORAL | Status: DC

## 2013-06-09 MED ORDER — TAMSULOSIN HCL 0.4 MG PO CAPS: 0.4000 mg | ORAL_CAPSULE | Freq: Every day | ORAL | Status: DC

## 2013-06-09 NOTE — Telephone Encounter (Signed)
Aware, rx for Norco ready.

## 2013-06-14 ENCOUNTER — Ambulatory Visit (INDEPENDENT_AMBULATORY_CARE_PROVIDER_SITE_OTHER): Payer: Medicare Other | Admitting: *Deleted

## 2013-06-14 ENCOUNTER — Other Ambulatory Visit: Payer: Self-pay | Admitting: General Practice

## 2013-06-14 ENCOUNTER — Ambulatory Visit (INDEPENDENT_AMBULATORY_CARE_PROVIDER_SITE_OTHER): Payer: Medicare Other | Admitting: Pharmacist

## 2013-06-14 DIAGNOSIS — I82409 Acute embolism and thrombosis of unspecified deep veins of unspecified lower extremity: Secondary | ICD-10-CM

## 2013-06-14 DIAGNOSIS — J810 Acute pulmonary edema: Secondary | ICD-10-CM

## 2013-06-14 DIAGNOSIS — Z23 Encounter for immunization: Secondary | ICD-10-CM

## 2013-06-14 LAB — POCT INR: INR: 2.2

## 2013-06-14 MED ORDER — PREGABALIN 50 MG PO CAPS: 50.0000 mg | ORAL_CAPSULE | Freq: Three times a day (TID) | ORAL | Status: DC

## 2013-06-18 ENCOUNTER — Other Ambulatory Visit: Payer: Self-pay | Admitting: Family Medicine

## 2013-06-18 ENCOUNTER — Other Ambulatory Visit: Payer: Self-pay | Admitting: Nurse Practitioner

## 2013-06-20 ENCOUNTER — Other Ambulatory Visit: Payer: Self-pay | Admitting: Family Medicine

## 2013-06-26 ENCOUNTER — Other Ambulatory Visit: Payer: Self-pay

## 2013-06-26 MED ORDER — LAMOTRIGINE 200 MG PO TABS: 200.0000 mg | ORAL_TABLET | Freq: Two times a day (BID) | ORAL | Status: DC

## 2013-06-26 NOTE — Telephone Encounter (Signed)
x

## 2013-06-28 ENCOUNTER — Ambulatory Visit (INDEPENDENT_AMBULATORY_CARE_PROVIDER_SITE_OTHER): Payer: Medicare Other | Admitting: Pharmacist

## 2013-06-28 ENCOUNTER — Telehealth: Payer: Self-pay | Admitting: General Practice

## 2013-06-28 DIAGNOSIS — I82409 Acute embolism and thrombosis of unspecified deep veins of unspecified lower extremity: Secondary | ICD-10-CM

## 2013-06-28 DIAGNOSIS — J810 Acute pulmonary edema: Secondary | ICD-10-CM

## 2013-06-28 LAB — POCT INR: INR: 2.1

## 2013-06-28 NOTE — Telephone Encounter (Signed)
Patient advised to go to urgent care today do to [place] not having any available appointments

## 2013-06-29 DIAGNOSIS — H9190 Unspecified hearing loss, unspecified ear: Secondary | ICD-10-CM

## 2013-06-29 HISTORY — DX: Unspecified hearing loss, unspecified ear: H91.90

## 2013-07-05 ENCOUNTER — Ambulatory Visit (INDEPENDENT_AMBULATORY_CARE_PROVIDER_SITE_OTHER): Payer: Medicare Other | Admitting: Pharmacist

## 2013-07-05 DIAGNOSIS — I82409 Acute embolism and thrombosis of unspecified deep veins of unspecified lower extremity: Secondary | ICD-10-CM

## 2013-07-05 DIAGNOSIS — J810 Acute pulmonary edema: Secondary | ICD-10-CM

## 2013-07-05 LAB — POCT INR: INR: 2.4

## 2013-07-05 NOTE — Progress Notes (Signed)
Patient checks INR at home with Home INR monitoring.  Billing once per month interupertation fee.  Patient diagnosis - history of DVT and PE Procedure code if G0250

## 2013-07-06 ENCOUNTER — Telehealth: Payer: Self-pay | Admitting: Pharmacist

## 2013-07-06 DIAGNOSIS — G894 Chronic pain syndrome: Secondary | ICD-10-CM

## 2013-07-06 MED ORDER — HYDROCODONE-ACETAMINOPHEN 7.5-325 MG PO TABS: 1.0000 | ORAL_TABLET | Freq: Every day | ORAL | Status: DC

## 2013-07-06 MED ORDER — FENTANYL 75 MCG/HR TD PT72: 75.0000 ug | MEDICATED_PATCH | TRANSDERMAL | Status: DC

## 2013-07-06 MED ORDER — INSULIN ASPART 100 UNIT/ML FLEXPEN: 35.0000 [IU] | PEN_INJECTOR | Freq: Three times a day (TID) | SUBCUTANEOUS | Status: DC

## 2013-07-06 NOTE — Telephone Encounter (Signed)
Patient called with warfarin instructions and he also requested Rx for duragesic and hydrocodone - rx printed for [facility] to sign.

## 2013-07-07 ENCOUNTER — Telehealth: Payer: Self-pay | Admitting: General Practice

## 2013-07-07 ENCOUNTER — Telehealth: Payer: Self-pay | Admitting: *Deleted

## 2013-07-07 ENCOUNTER — Other Ambulatory Visit: Payer: Self-pay | Admitting: *Deleted

## 2013-07-07 MED ORDER — GLUCOSE BLOOD VI STRP
ORAL_STRIP | Status: DC
Start: 2013-07-07 — End: 2013-07-12

## 2013-07-07 NOTE — Telephone Encounter (Signed)
Aware,rx for pain med and patch available for pick up here.

## 2013-07-12 ENCOUNTER — Other Ambulatory Visit: Payer: Self-pay | Admitting: Pharmacist

## 2013-07-12 ENCOUNTER — Ambulatory Visit (INDEPENDENT_AMBULATORY_CARE_PROVIDER_SITE_OTHER): Payer: Medicare Other | Admitting: Pharmacist

## 2013-07-12 DIAGNOSIS — J810 Acute pulmonary edema: Secondary | ICD-10-CM

## 2013-07-12 LAB — POCT INR: INR: 2.2

## 2013-07-12 MED ORDER — GLUCOSE BLOOD VI STRP: ORAL_STRIP | Status: DC

## 2013-07-12 NOTE — Telephone Encounter (Signed)
Patient called to find out about status of PA for muscle relaxers - cyclobenzeprine and robaxin and also for invokana.  Note forwarded to Zadie Rhine to look into what we need to do.

## 2013-07-12 NOTE — Telephone Encounter (Signed)
[facility] is not requiring rx to be faxed for test strips with signature.  rx printed and signed by MMM, then faxed.

## 2013-07-19 ENCOUNTER — Ambulatory Visit (INDEPENDENT_AMBULATORY_CARE_PROVIDER_SITE_OTHER): Payer: Medicare Other | Admitting: Pharmacist

## 2013-07-19 DIAGNOSIS — J810 Acute pulmonary edema: Secondary | ICD-10-CM

## 2013-07-19 LAB — POCT INR
INR: 2.1
INR: 2.2

## 2013-07-19 NOTE — Patient Instructions (Signed)
No charge for labs or visit - INR check through home monitoring system  

## 2013-07-20 ENCOUNTER — Ambulatory Visit: Payer: Medicare Other | Admitting: General Practice

## 2013-07-21 ENCOUNTER — Encounter: Payer: Self-pay | Admitting: General Practice

## 2013-07-21 ENCOUNTER — Ambulatory Visit (INDEPENDENT_AMBULATORY_CARE_PROVIDER_SITE_OTHER): Payer: Medicare Other | Admitting: General Practice

## 2013-07-21 VITALS — BP 122/68 | HR 62 | Temp 98.9°F

## 2013-07-21 DIAGNOSIS — F329 Major depressive disorder, single episode, unspecified: Secondary | ICD-10-CM

## 2013-07-21 DIAGNOSIS — N529 Male erectile dysfunction, unspecified: Secondary | ICD-10-CM

## 2013-07-21 DIAGNOSIS — R609 Edema, unspecified: Secondary | ICD-10-CM

## 2013-07-21 DIAGNOSIS — M62838 Other muscle spasm: Secondary | ICD-10-CM

## 2013-07-21 DIAGNOSIS — K219 Gastro-esophageal reflux disease without esophagitis: Secondary | ICD-10-CM

## 2013-07-21 DIAGNOSIS — E785 Hyperlipidemia, unspecified: Secondary | ICD-10-CM

## 2013-07-21 DIAGNOSIS — J309 Allergic rhinitis, unspecified: Secondary | ICD-10-CM

## 2013-07-21 DIAGNOSIS — N400 Enlarged prostate without lower urinary tract symptoms: Secondary | ICD-10-CM

## 2013-07-21 DIAGNOSIS — F32A Depression, unspecified: Secondary | ICD-10-CM

## 2013-07-21 DIAGNOSIS — E119 Type 2 diabetes mellitus without complications: Secondary | ICD-10-CM

## 2013-07-21 DIAGNOSIS — E876 Hypokalemia: Secondary | ICD-10-CM

## 2013-07-21 DIAGNOSIS — R569 Unspecified convulsions: Secondary | ICD-10-CM

## 2013-07-21 DIAGNOSIS — E291 Testicular hypofunction: Secondary | ICD-10-CM

## 2013-07-21 DIAGNOSIS — F3289 Other specified depressive episodes: Secondary | ICD-10-CM

## 2013-07-21 LAB — POCT GLYCOSYLATED HEMOGLOBIN (HGB A1C): Hemoglobin A1C: 8.2

## 2013-07-21 MED ORDER — ACCU-CHEK SOFTCLIX LANCETS MISC: 1.0000 | Freq: Four times a day (QID) | Status: DC

## 2013-07-21 MED ORDER — ESOMEPRAZOLE MAGNESIUM 40 MG PO CPDR: 40.0000 mg | DELAYED_RELEASE_CAPSULE | Freq: Every day | ORAL | Status: DC

## 2013-07-21 MED ORDER — FLUTICASONE PROPIONATE 50 MCG/ACT NA SUSP: 1.0000 | Freq: Two times a day (BID) | NASAL | Status: DC

## 2013-07-21 MED ORDER — CITALOPRAM HYDROBROMIDE 20 MG PO TABS: ORAL_TABLET | ORAL | Status: DC

## 2013-07-21 MED ORDER — METHOCARBAMOL 750 MG PO TABS: 750.0000 mg | ORAL_TABLET | Freq: Two times a day (BID) | ORAL | Status: DC | PRN

## 2013-07-21 MED ORDER — FUROSEMIDE 80 MG PO TABS: 80.0000 mg | ORAL_TABLET | Freq: Two times a day (BID) | ORAL | Status: DC

## 2013-07-21 MED ORDER — CANAGLIFLOZIN 100 MG PO TABS: 1.0000 | ORAL_TABLET | Freq: Every day | ORAL | Status: DC

## 2013-07-21 MED ORDER — POTASSIUM CHLORIDE CRYS ER 10 MEQ PO TBCR: 10.0000 meq | EXTENDED_RELEASE_TABLET | Freq: Three times a day (TID) | ORAL | Status: DC

## 2013-07-21 MED ORDER — BUSPIRONE HCL 15 MG PO TABS: 15.0000 mg | ORAL_TABLET | Freq: Two times a day (BID) | ORAL | Status: DC

## 2013-07-21 MED ORDER — GLUCOSE BLOOD VI STRP: ORAL_STRIP | Status: DC

## 2013-07-21 MED ORDER — PREGABALIN 50 MG PO CAPS: 50.0000 mg | ORAL_CAPSULE | Freq: Three times a day (TID) | ORAL | Status: DC

## 2013-07-21 MED ORDER — INSULIN PEN NEEDLE 31G X 8 MM MISC: 1.0000 "pen " | Freq: Every day | Status: DC

## 2013-07-21 MED ORDER — INSULIN ASPART 100 UNIT/ML FLEXPEN: 35.0000 [IU] | PEN_INJECTOR | Freq: Three times a day (TID) | SUBCUTANEOUS | Status: DC

## 2013-07-21 MED ORDER — TAMSULOSIN HCL 0.4 MG PO CAPS: 0.4000 mg | ORAL_CAPSULE | Freq: Every day | ORAL | Status: DC

## 2013-07-21 MED ORDER — LEVETIRACETAM 500 MG PO TABS: 500.0000 mg | ORAL_TABLET | Freq: Two times a day (BID) | ORAL | Status: DC

## 2013-07-21 MED ORDER — SIMVASTATIN 20 MG PO TABS: 20.0000 mg | ORAL_TABLET | Freq: Every day | ORAL | Status: DC

## 2013-07-21 MED ORDER — INSULIN GLARGINE 100 UNIT/ML SOLOSTAR PEN: 95.0000 [IU] | PEN_INJECTOR | Freq: Two times a day (BID) | SUBCUTANEOUS | Status: DC

## 2013-07-21 MED ORDER — LAMOTRIGINE 200 MG PO TABS: 200.0000 mg | ORAL_TABLET | Freq: Two times a day (BID) | ORAL | Status: DC

## 2013-07-21 NOTE — Patient Instructions (Signed)
Exercise to Stay Healthy Exercise helps you become and stay healthy. EXERCISE IDEAS AND TIPS Choose exercises that:  You enjoy.  Fit into your day. You do not need to exercise really hard to be healthy. You can do exercises at a slow or medium level and stay healthy. You can:  Stretch before and after working out.  Try yoga, Pilates, or tai chi.  Lift weights.  Walk fast, swim, jog, run, climb stairs, bicycle, dance, or rollerskate.  Take aerobic classes. Exercises that burn about 150 calories:  Running 1  miles in 15 minutes.  Playing volleyball for 45 to 60 minutes.  Washing and waxing a car for 45 to 60 minutes.  Playing touch football for 45 minutes.  Walking 1  miles in 35 minutes.  Pushing a stroller 1  miles in 30 minutes.  Playing basketball for 30 minutes.  Raking leaves for 30 minutes.  Bicycling 5 miles in 30 minutes.  Walking 2 miles in 30 minutes.  Dancing for 30 minutes.  Shoveling snow for 15 minutes.  Swimming laps for 20 minutes.  Walking up stairs for 15 minutes.  Bicycling 4 miles in 15 minutes.  Gardening for 30 to 45 minutes.  Jumping rope for 15 minutes.  Washing windows or floors for 45 to 60 minutes. Document Released: 07/18/2010 Document Revised: 09/07/2011 Document Reviewed: 07/18/2010 [employer] Patient Information 2014 [employer].  

## 2013-07-23 LAB — CMP14+EGFR
ALT: 15 IU/L (ref 0–44)
AST: 25 IU/L (ref 0–40)
Albumin/Globulin Ratio: 1.3 (ref 1.1–2.5)
Albumin: 3.8 g/dL (ref 3.5–5.5)
Alkaline Phosphatase: 99 IU/L (ref 39–117)
BUN/Creatinine Ratio: 20 (ref 9–20)
BUN: 25 mg/dL — ABNORMAL HIGH (ref 6–24)
CO2: 29 mmol/L (ref 18–29)
Calcium: 8.4 mg/dL — ABNORMAL LOW (ref 8.7–10.2)
Chloride: 97 mmol/L (ref 97–108)
Creatinine, Ser: 1.26 mg/dL (ref 0.76–1.27)
GFR calc Af Amer: 79 mL/min/{1.73_m2} (ref >59)
GFR calc non Af Amer: 68 mL/min/{1.73_m2} (ref >59)
Globulin, Total: 2.9 g/dL (ref 1.5–4.5)
Glucose: 174 mg/dL — ABNORMAL HIGH (ref 65–99)
Potassium: 4.4 mmol/L (ref 3.5–5.2)
Sodium: 139 mmol/L (ref 134–144)
Total Bilirubin: 0.5 mg/dL (ref 0.0–1.2)
Total Protein: 6.7 g/dL (ref 6.0–8.5)

## 2013-07-23 LAB — LIPID PANEL
Chol/HDL Ratio: 3.3 ratio units (ref 0.0–5.0)
Cholesterol, Total: 112 mg/dL (ref 100–199)
HDL: 34 mg/dL — ABNORMAL LOW (ref >39)
LDL Calculated: 49 mg/dL (ref 0–99)
Triglycerides: 143 mg/dL (ref 0–149)
VLDL Cholesterol Cal: 29 mg/dL (ref 5–40)

## 2013-07-23 LAB — TESTOSTERONE,FREE AND TOTAL
Testosterone, Free: 0.9 pg/mL — ABNORMAL LOW (ref 6.8–21.5)
Testosterone: 120 ng/dL — ABNORMAL LOW (ref 348–1197)

## 2013-07-24 ENCOUNTER — Other Ambulatory Visit: Payer: Self-pay | Admitting: General Practice

## 2013-07-24 ENCOUNTER — Telehealth: Payer: Self-pay | Admitting: General Practice

## 2013-07-24 DIAGNOSIS — E119 Type 2 diabetes mellitus without complications: Secondary | ICD-10-CM

## 2013-07-24 DIAGNOSIS — F329 Major depressive disorder, single episode, unspecified: Secondary | ICD-10-CM

## 2013-07-24 DIAGNOSIS — F32A Depression, unspecified: Secondary | ICD-10-CM

## 2013-07-24 DIAGNOSIS — E291 Testicular hypofunction: Secondary | ICD-10-CM

## 2013-07-24 MED ORDER — CANAGLIFLOZIN 100 MG PO TABS: 1.0000 | ORAL_TABLET | Freq: Every day | ORAL | Status: DC

## 2013-07-24 MED ORDER — LAMOTRIGINE 200 MG PO TABS: 200.0000 mg | ORAL_TABLET | Freq: Two times a day (BID) | ORAL | Status: DC

## 2013-07-24 MED ORDER — INSULIN GLARGINE 100 UNIT/ML SOLOSTAR PEN: 95.0000 [IU] | PEN_INJECTOR | Freq: Two times a day (BID) | SUBCUTANEOUS | Status: DC

## 2013-07-24 MED ORDER — WARFARIN SODIUM 5 MG PO TABS
7.5000 mg | ORAL_TABLET | Freq: Every day | ORAL | Status: DC
Start: 2013-07-24 — End: 2014-03-21

## 2013-07-24 MED ORDER — INSULIN ASPART 100 UNIT/ML FLEXPEN: 35.0000 [IU] | PEN_INJECTOR | Freq: Three times a day (TID) | SUBCUTANEOUS | Status: DC

## 2013-07-24 MED ORDER — CITALOPRAM HYDROBROMIDE 20 MG PO TABS: ORAL_TABLET | ORAL | Status: DC

## 2013-07-24 MED ORDER — TESTOSTERONE 50 MG/5GM (1%) TD GEL: 5.0000 g | Freq: Every day | TRANSDERMAL | Status: DC

## 2013-07-24 MED ORDER — BUSPIRONE HCL 15 MG PO TABS: 15.0000 mg | ORAL_TABLET | Freq: Two times a day (BID) | ORAL | Status: DC

## 2013-07-24 NOTE — Telephone Encounter (Signed)
Please inform scripts are ready for pick up

## 2013-07-24 NOTE — Telephone Encounter (Signed)
Patient was in last week and received Rx's for mail order (90 day supply) but there were 3 rx's that were not 90 days - novolog, lantus and celexa.  Also patient states he did not received rx's for Lamictal, invokana, warfarin, buspar and androgel.  Medication report show that [facility] printed Rx for 4 of those but patient states he did not received.   I will do Rx's for novolog, lantus and celexa and forward note to [place] so she can decide what to do about other medications.

## 2013-07-25 NOTE — Progress Notes (Signed)
Subjective:    Patient ID: [name], male    DOB: [birth year], 48 y.o.   MRN: [id number]  HPI Patient presents today for chronic health follow up. He reports taking medications as prescribed and eating healthy. Denies any complaints at this time.    Review of Systems  Constitutional: Negative for fever and chills.  HENT: Negative for facial swelling.   Respiratory: Negative for cough, chest tightness and shortness of breath.   Cardiovascular: Negative for chest pain and palpitations.  Gastrointestinal: Negative for nausea, vomiting, abdominal pain, diarrhea, constipation and blood in stool.  Genitourinary: Negative for flank pain and difficulty urinating.  Musculoskeletal: Positive for back pain.       Chronic back pain  Neurological: Negative for dizziness, weakness and headaches.       Objective:   Physical Exam  Constitutional: He is oriented to person, place, and time. He appears well-developed and well-nourished.  obese  HENT:  Head: Normocephalic and atraumatic.  Right Ear: External ear normal.  Left Ear: External ear normal.  Mouth/Throat: Oropharynx is clear and moist.  Eyes: Conjunctivae and EOM are normal.  Neck: Normal range of motion. Neck supple. No JVD present. No thyromegaly present.  Cardiovascular: Normal rate, regular rhythm and normal heart sounds.   Pulmonary/Chest: Effort normal and breath sounds normal. No respiratory distress. He exhibits no tenderness.  Abdominal: Soft. Bowel sounds are normal. He exhibits no distension. There is no tenderness. There is no CVA tenderness, no tenderness at McBurney's point and negative Murphy's sign.     Neurological: He is alert and oriented to person, place, and time.  Skin: Skin is warm and dry.  Psychiatric: He has a normal mood and affect.           Assessment & Plan:  1. BPH (benign prostatic hyperplasia)  - tamsulosin (FLOMAX) 0.4 MG CAPS capsule; Take 1 capsule (0.4 mg total) by mouth daily.   Dispense: 90 capsule; Refill: 3  2. Erectile dysfunction   3. Diabetes  - POCT glycosylated hemoglobin (Hb A1C) - CMP14+EGFR - glucose blood (ACCU-CHEK COMPACT PLUS) test strip; Use to check BG up to 4 times daily.  Dx: 250.02 type 2 DM, not at goals  Dispense: 400 each; Refill: 0 - ACCU-CHEK SOFTCLIX LANCETS lancets; 1 each by Other route 4 (four) times daily. Use as instructed  Dispense: 200 each; Refill: 3 - Insulin Pen Needle (B-D ULTRAFINE III SHORT PEN) 31G X 8 MM MISC; Inject 1 pen into the skin 5 (five) times daily.  Dispense: 500 each; Refill: 3  4. Depression  - pregabalin (LYRICA) 50 MG capsule; Take 1 capsule (50 mg total) by mouth 3 (three) times daily.  Dispense: 270 capsule; Refill: 3  5. Hyperlipidemia  - Lipid panel - simvastatin (ZOCOR) 20 MG tablet; Take 1 tablet (20 mg total) by mouth at bedtime.  Dispense: 90 tablet; Refill: 3  6. Hypogonadism male  - Testosterone,Free and Total  7. GERD (gastroesophageal reflux disease)  - esomeprazole (NEXIUM) 40 MG capsule; Take 1 capsule (40 mg total) by mouth daily before breakfast.  Dispense: 90 capsule; Refill: 3  8. Allergic rhinitis  - fluticasone (FLONASE) 50 MCG/ACT nasal spray; Place 1 spray into both nostrils 2 (two) times daily.  Dispense: 48 g; Refill: 3  9. Seizures  levETIRAcetam (KEPPRA) 500 MG tablet; Take 1 tablet (500 mg total) by mouth 2 (two) times daily.  Dispense: 180 tablet; Refill: 3  10. Edema   11. Hypokalemia  - potassium  chloride (KLOR-CON M10) 10 MEQ tablet; Take 1 tablet (10 mEq total) by mouth 3 (three) times daily.  Dispense: 270 tablet; Refill: 3  12. Muscle spasm  - methocarbamol (ROBAXIN) 750 MG tablet; Take 1 tablet (750 mg total) by mouth 2 (two) times daily as needed.  Dispense: 180 tablet; Refill: 3 Continue all current medications Labs pending F/u in 3 months Discussed benefits of regular exercise and healthy eating Patient verbalized understanding Erby Pian,  FNP-C

## 2013-07-26 ENCOUNTER — Ambulatory Visit (INDEPENDENT_AMBULATORY_CARE_PROVIDER_SITE_OTHER): Payer: Medicare Other | Admitting: Pharmacist

## 2013-07-26 ENCOUNTER — Telehealth: Payer: Self-pay | Admitting: *Deleted

## 2013-07-26 DIAGNOSIS — J810 Acute pulmonary edema: Secondary | ICD-10-CM

## 2013-07-26 LAB — POCT INR: INR: 2.5

## 2013-07-26 NOTE — Telephone Encounter (Signed)
Ins co denied cyclobenzaprine hcl because the use is not supported by the food and drug administration or by one of the medicare approved references for treating chronic pain syndrome.

## 2013-07-26 NOTE — Progress Notes (Signed)
No charge for labs or visit - INR check through home monitoring system  

## 2013-07-26 NOTE — Telephone Encounter (Signed)
Patient should continue to take robaxin.

## 2013-07-27 NOTE — Telephone Encounter (Signed)
Pt.notified

## 2013-07-31 ENCOUNTER — Other Ambulatory Visit (HOSPITAL_COMMUNITY): Payer: Self-pay | Admitting: Unknown Physician Specialty

## 2013-07-31 DIAGNOSIS — R519 Headache, unspecified: Secondary | ICD-10-CM

## 2013-07-31 DIAGNOSIS — R51 Headache: Principal | ICD-10-CM

## 2013-08-01 ENCOUNTER — Telehealth: Payer: Self-pay | Admitting: Pharmacist

## 2013-08-02 ENCOUNTER — Ambulatory Visit (INDEPENDENT_AMBULATORY_CARE_PROVIDER_SITE_OTHER): Payer: Medicare Other | Admitting: Pharmacist

## 2013-08-02 DIAGNOSIS — G894 Chronic pain syndrome: Secondary | ICD-10-CM

## 2013-08-02 DIAGNOSIS — J810 Acute pulmonary edema: Secondary | ICD-10-CM

## 2013-08-02 LAB — POCT INR: INR: 2.1

## 2013-08-02 MED ORDER — FENTANYL 75 MCG/HR TD PT72: 75.0000 ug | MEDICATED_PATCH | TRANSDERMAL | Status: DC

## 2013-08-02 MED ORDER — HYDROCODONE-ACETAMINOPHEN 7.5-325 MG PO TABS: 1.0000 | ORAL_TABLET | Freq: Every day | ORAL | Status: DC

## 2013-08-02 NOTE — Progress Notes (Signed)
Patient has been checking INR through [facility] at home but he was just notified that his insurance is no longer going to cover home INR service.  I instructed patient to no check INR at home any more because he might be billed for full cost.  Appt is set up to recheck INR in our office in 1 month.  I also called Mandy with [employer] to let her know about situation and see if she knew who would be picking up patient's INR monitor.  I had to leave message on her voicemail.  Cherre Robins, PharmD, CPP

## 2013-08-02 NOTE — Telephone Encounter (Signed)
Will need to have [place] / [employer] come pick up meter and supplies and schedule monthly INR appts. Please see notes in anticoagulation clinic documentation.

## 2013-08-04 ENCOUNTER — Ambulatory Visit (HOSPITAL_COMMUNITY)
Admission: RE | Admit: 2013-08-04 | Discharge: 2013-08-04 | Disposition: A | Discharge status: Home / Self Care | Payer: PRIVATE HEALTH INSURANCE | Source: Ambulatory Visit | Attending: Unknown Physician Specialty | Admitting: Unknown Physician Specialty

## 2013-08-04 DIAGNOSIS — R51 Headache: Secondary | ICD-10-CM | POA: Insufficient documentation

## 2013-08-04 DIAGNOSIS — R519 Headache, unspecified: Secondary | ICD-10-CM

## 2013-08-08 ENCOUNTER — Telehealth: Payer: Self-pay | Admitting: General Practice

## 2013-08-08 NOTE — Telephone Encounter (Signed)
He was unaware of any script problems. He will ask his wife to call mail order company and have them fax scripts that need clarification.  [phone] is number for mail order.

## 2013-08-21 ENCOUNTER — Telehealth: Payer: Self-pay | Admitting: General Practice

## 2013-08-23 ENCOUNTER — Other Ambulatory Visit: Payer: Self-pay | Admitting: General Practice

## 2013-08-23 DIAGNOSIS — F329 Major depressive disorder, single episode, unspecified: Secondary | ICD-10-CM

## 2013-08-23 DIAGNOSIS — F32A Depression, unspecified: Secondary | ICD-10-CM

## 2013-08-23 DIAGNOSIS — E291 Testicular hypofunction: Secondary | ICD-10-CM

## 2013-08-23 NOTE — Telephone Encounter (Signed)
Script written on 07/24/13, printed out for mail order. Refills noted on scripts.  [facility].

## 2013-08-25 ENCOUNTER — Other Ambulatory Visit: Payer: Self-pay | Admitting: Nurse Practitioner

## 2013-08-25 DIAGNOSIS — F32A Depression, unspecified: Secondary | ICD-10-CM

## 2013-08-25 DIAGNOSIS — F329 Major depressive disorder, single episode, unspecified: Secondary | ICD-10-CM

## 2013-08-25 DIAGNOSIS — E291 Testicular hypofunction: Secondary | ICD-10-CM

## 2013-08-25 MED ORDER — CITALOPRAM HYDROBROMIDE 20 MG PO TABS: ORAL_TABLET | ORAL | Status: DC

## 2013-08-25 MED ORDER — TESTOSTERONE 50 MG/5GM (1%) TD GEL: 5.0000 g | Freq: Every day | TRANSDERMAL | Status: DC

## 2013-08-25 NOTE — Telephone Encounter (Signed)
rx ready for pickup 

## 2013-08-25 NOTE — Telephone Encounter (Signed)
Aware. 

## 2013-08-25 NOTE — Telephone Encounter (Signed)
Patient needs the [place] and androgel sent to [facility] for 3 mth supply

## 2013-08-28 ENCOUNTER — Telehealth: Payer: Self-pay | Admitting: Pharmacist

## 2013-08-28 DIAGNOSIS — G894 Chronic pain syndrome: Secondary | ICD-10-CM

## 2013-08-28 MED ORDER — HYDROCODONE-ACETAMINOPHEN 7.5-325 MG PO TABS: 1.0000 | ORAL_TABLET | Freq: Every day | ORAL | Status: DC

## 2013-08-28 MED ORDER — FENTANYL 75 MCG/HR TD PT72: 75.0000 ug | MEDICATED_PATCH | TRANSDERMAL | Status: DC

## 2013-08-28 NOTE — Telephone Encounter (Signed)
rx's for fentanyl and hydrocodone printed and left for Mae Haliburotn to sign.

## 2013-09-04 ENCOUNTER — Ambulatory Visit (INDEPENDENT_AMBULATORY_CARE_PROVIDER_SITE_OTHER): Payer: Medicare Other | Admitting: Pharmacist

## 2013-09-04 DIAGNOSIS — N183 Chronic kidney disease, stage 3 unspecified: Secondary | ICD-10-CM

## 2013-09-04 DIAGNOSIS — J810 Acute pulmonary edema: Secondary | ICD-10-CM

## 2013-09-04 DIAGNOSIS — R7989 Other specified abnormal findings of blood chemistry: Secondary | ICD-10-CM

## 2013-09-04 DIAGNOSIS — I82409 Acute embolism and thrombosis of unspecified deep veins of unspecified lower extremity: Secondary | ICD-10-CM

## 2013-09-04 DIAGNOSIS — E291 Testicular hypofunction: Secondary | ICD-10-CM

## 2013-09-04 LAB — POCT INR: INR: 1.9

## 2013-09-04 NOTE — Patient Instructions (Signed)
Anticoagulation Dose Instructions as of 09/04/2013     Dorene Grebe Tue Wed Thu Fri Sat   New Dose 5 mg 5 mg 7.5 mg 5 mg 5 mg 5 mg 7.5 mg    Description       Take 1 and 1/2 tablet today, then continue 1 and 1/2 tuesdays and saturdays, 1 tablet all other days      INR was 1.9 today

## 2013-09-05 LAB — PARATHYROID HORMONE, INTACT (NO CA): PTH: 59 pg/mL (ref 15–65)

## 2013-09-06 LAB — FERRITIN: Ferritin: 25 ng/mL — ABNORMAL LOW (ref 30–400)

## 2013-09-06 LAB — IRON AND TIBC
Iron Saturation: 13 % — ABNORMAL LOW (ref 15–55)
Iron: 34 ug/dL — ABNORMAL LOW (ref 40–155)
TIBC: 267 ug/dL (ref 250–450)
UIBC: 233 ug/dL (ref 150–375)

## 2013-09-06 LAB — TESTOSTERONE,FREE AND TOTAL
Testosterone, Free: 28.8 pg/mL — ABNORMAL HIGH (ref 6.8–21.5)
Testosterone: 278 ng/dL — ABNORMAL LOW (ref 348–1197)

## 2013-09-06 LAB — BMP8+EGFR
BUN/Creatinine Ratio: 21 — ABNORMAL HIGH (ref 9–20)
BUN: 32 mg/dL — ABNORMAL HIGH (ref 6–24)
CO2: 30 mmol/L — ABNORMAL HIGH (ref 18–29)
Calcium: 8.9 mg/dL (ref 8.7–10.2)
Chloride: 94 mmol/L — ABNORMAL LOW (ref 97–108)
Creatinine, Ser: 1.52 mg/dL — ABNORMAL HIGH (ref 0.76–1.27)
GFR calc Af Amer: 63 mL/min/{1.73_m2} (ref >59)
GFR calc non Af Amer: 54 mL/min/{1.73_m2} — ABNORMAL LOW (ref >59)
Glucose: 239 mg/dL — ABNORMAL HIGH (ref 65–99)
Potassium: 4.5 mmol/L (ref 3.5–5.2)
Sodium: 142 mmol/L (ref 134–144)

## 2013-09-18 ENCOUNTER — Other Ambulatory Visit: Payer: Self-pay | Admitting: *Deleted

## 2013-09-18 DIAGNOSIS — E119 Type 2 diabetes mellitus without complications: Secondary | ICD-10-CM

## 2013-09-18 MED ORDER — INSULIN GLARGINE 100 UNIT/ML SOLOSTAR PEN: 95.0000 [IU] | PEN_INJECTOR | Freq: Two times a day (BID) | SUBCUTANEOUS | Status: DC

## 2013-09-28 ENCOUNTER — Encounter: Payer: Self-pay | Admitting: Pharmacist

## 2013-09-28 ENCOUNTER — Ambulatory Visit (INDEPENDENT_AMBULATORY_CARE_PROVIDER_SITE_OTHER): Payer: Medicare Other | Admitting: Pharmacist

## 2013-09-28 VITALS — BP 124/72 | HR 87 | Ht 75.0 in

## 2013-09-28 DIAGNOSIS — H9190 Unspecified hearing loss, unspecified ear: Secondary | ICD-10-CM

## 2013-09-28 DIAGNOSIS — J810 Acute pulmonary edema: Secondary | ICD-10-CM

## 2013-09-28 DIAGNOSIS — G894 Chronic pain syndrome: Secondary | ICD-10-CM

## 2013-09-28 DIAGNOSIS — Z0000 Encounter for general adult medical examination without abnormal findings: Secondary | ICD-10-CM

## 2013-09-28 DIAGNOSIS — Z23 Encounter for immunization: Secondary | ICD-10-CM

## 2013-09-28 LAB — POCT INR: INR: 2.3

## 2013-09-28 MED ORDER — FENTANYL 75 MCG/HR TD PT72: 75.0000 ug | MEDICATED_PATCH | TRANSDERMAL | Status: DC

## 2013-09-28 MED ORDER — HYDROCODONE-ACETAMINOPHEN 7.5-325 MG PO TABS: 1.0000 | ORAL_TABLET | Freq: Every day | ORAL | Status: DC

## 2013-09-28 NOTE — Progress Notes (Signed)
Subjective:    [name] is a 48 y.o. male who presents for Medicare Initial preventive examination.   Preventive Screening-Counseling & Management  Tobacco History  Smoking status  . Never Smoker   Smokeless tobacco  . Never Used    Current Problems (verified) Patient Active Problem List   Diagnosis Date Noted  . Postoperative pulmonary edema 09/15/2012  . DVT (deep venous thrombosis), unspecified laterality 09/15/2012  . Altered mental status 05/12/2012  . Anemia 05/11/2012  . Thrombocytopenia 05/11/2012  . Acute renal failure 05/11/2012  . Chronic pain syndrome 05/11/2012  . Acute respiratory failure 05/10/2012  . Hypoxia 05/10/2012  . Morbid obesity 05/10/2012  . OSA (obstructive sleep apnea) 05/10/2012  . Obesity hypoventilation syndrome 05/10/2012  . DM2 (diabetes mellitus, type 2) 05/10/2012  . History of pulmonary embolism 05/10/2012  . Chronic anticoagulation 05/10/2012  . Seizure disorder 05/10/2012  . DYSPNEA 02/05/2009    Medications Prior to Visit Current Outpatient Prescriptions on File Prior to Visit  Medication Sig Dispense Refill  . ACCU-CHEK SOFTCLIX LANCETS lancets 1 each by Other route 4 (four) times daily. Use as instructed  200 each  3  . B-D ULTRAFINE III SHORT PEN 31G X 8 MM MISC 5 INJECTIONS DAILY-- DX 250.02  200 each  2  . busPIRone (BUSPAR) 15 MG tablet Take 1 tablet (15 mg total) by mouth 2 (two) times daily.  180 tablet  3  . Canagliflozin (INVOKANA) 100 MG TABS Take 1 tablet (100 mg total) by mouth daily.  90 tablet  3  . citalopram (CELEXA) 20 MG tablet Take 3 tablets at bedtime (60 mg total)  90 tablet  1  . cyclobenzaprine (FLEXERIL) 10 MG tablet Take 0.5 tablets (5 mg total) by mouth daily as needed for muscle spasms.  30 tablet  3  . docusate sodium (COLACE) 100 MG capsule Take 100 mg by mouth 2 (two) times daily.      Marland Kitchen EPINEPHrine (EPIPEN 2-PAK) 0.3 mg/0.3 mL SOAJ injection Inject 0.3 mLs (0.3 mg total) into the muscle once.  As needed for anaphylactic reaction  2 Device  0  . esomeprazole (NEXIUM) 40 MG capsule Take 1 capsule (40 mg total) by mouth daily before breakfast.  90 capsule  3  . fluticasone (FLONASE) 50 MCG/ACT nasal spray Place 1 spray into both nostrils 2 (two) times daily.  48 g  3  . furosemide (LASIX) 80 MG tablet Take 1 tablet (80 mg total) by mouth 2 (two) times daily.  180 tablet  3  . glucose blood (ACCU-CHEK COMPACT PLUS) test strip Use to check BG up to 4 times daily.  Dx: 250.02 type 2 DM, not at goals  400 each  0  . insulin aspart (NOVOLOG FLEXPEN) 100 UNIT/ML FlexPen Inject 35 Units into the skin 3 (three) times daily with meals.  105 mL  3  . Insulin Glargine (LANTUS SOLOSTAR) 100 UNIT/ML Solostar Pen Inject 95 Units into the skin 2 (two) times daily.  165 mL  1  . Insulin Pen Needle (B-D ULTRAFINE III SHORT PEN) 31G X 8 MM MISC Inject 1 pen into the skin 5 (five) times daily.  500 each  3  . Insulin Syringes, Disposable, U-100 1 ML MISC Use to inject insulin as directed up to qid  Dx 250.02 - adult onset DM now requiring multiple daily injections for control  200 each  2  . lamoTRIgine (LAMICTAL) 200 MG tablet Take 1 tablet (200 mg total) by mouth 2 (  two) times daily.  180 tablet  3  . methocarbamol (ROBAXIN) 750 MG tablet Take 1 tablet (750 mg total) by mouth 2 (two) times daily as needed.  180 tablet  3  . Multiple Vitamin (MULTIVITAMIN WITH MINERALS) TABS Take 1 tablet by mouth daily.      . pregabalin (LYRICA) 50 MG capsule Take 1 capsule (50 mg total) by mouth 3 (three) times daily.  270 capsule  3  . simvastatin (ZOCOR) 20 MG tablet Take 1 tablet (20 mg total) by mouth at bedtime.  90 tablet  3  . tamsulosin (FLOMAX) 0.4 MG CAPS capsule Take 1 capsule (0.4 mg total) by mouth daily.  90 capsule  3  . testosterone (ANDROGEL) 50 MG/5GM GEL Place 5 g onto the skin daily. Apply to shoulder daily  90 Tube  1  . warfarin (COUMADIN) 5 MG tablet Take 1.5-2 tablets (7.5-10 mg total) by mouth  daily. Take 1.5 tab every day except thursdays take 2 tabs  144 tablet  3  . zolpidem (AMBIEN) 5 MG tablet Take 0.5 tablets (2.5 mg total) by mouth at bedtime as needed. For sleep  30 tablet  3  . [DISCONTINUED] potassium chloride (K-DUR) 10 MEQ tablet Take 3 tablets (30 mEq total) by mouth daily.  270 tablet  0   No current facility-administered medications on file prior to visit.    Current Medications (verified) Current Outpatient Prescriptions  Medication Sig Dispense Refill  . ACCU-CHEK SOFTCLIX LANCETS lancets 1 each by Other route 4 (four) times daily. Use as instructed  200 each  3  . B-D ULTRAFINE III SHORT PEN 31G X 8 MM MISC 5 INJECTIONS DAILY-- DX 250.02  200 each  2  . busPIRone (BUSPAR) 15 MG tablet Take 1 tablet (15 mg total) by mouth 2 (two) times daily.  180 tablet  3  . Canagliflozin (INVOKANA) 100 MG TABS Take 1 tablet (100 mg total) by mouth daily.  90 tablet  3  . citalopram (CELEXA) 20 MG tablet Take 3 tablets at bedtime (60 mg total)  90 tablet  1  . cyclobenzaprine (FLEXERIL) 10 MG tablet Take 0.5 tablets (5 mg total) by mouth daily as needed for muscle spasms.  30 tablet  3  . docusate sodium (COLACE) 100 MG capsule Take 100 mg by mouth 2 (two) times daily.      Marland Kitchen EPINEPHrine (EPIPEN 2-PAK) 0.3 mg/0.3 mL SOAJ injection Inject 0.3 mLs (0.3 mg total) into the muscle once. As needed for anaphylactic reaction  2 Device  0  . esomeprazole (NEXIUM) 40 MG capsule Take 1 capsule (40 mg total) by mouth daily before breakfast.  90 capsule  3  . fentaNYL (DURAGESIC) 75 MCG/HR Place 1 patch (75 mcg total) onto the skin every 3 (three) days. May fill 09/30/13  10 patch  0  . fluticasone (FLONASE) 50 MCG/ACT nasal spray Place 1 spray into both nostrils 2 (two) times daily.  48 g  3  . furosemide (LASIX) 80 MG tablet Take 1 tablet (80 mg total) by mouth 2 (two) times daily.  180 tablet  3  . glucose blood (ACCU-CHEK COMPACT PLUS) test strip Use to check BG up to 4 times daily.  Dx:  250.02 type 2 DM, not at goals  400 each  0  . HYDROcodone-acetaminophen (NORCO) 7.5-325 MG per tablet Take 1 tablet by mouth daily. As needed for BTP (1 tablet per day)  30 tablet  0  . insulin aspart (NOVOLOG FLEXPEN) 100  UNIT/ML FlexPen Inject 35 Units into the skin 3 (three) times daily with meals.  105 mL  3  . Insulin Glargine (LANTUS SOLOSTAR) 100 UNIT/ML Solostar Pen Inject 95 Units into the skin 2 (two) times daily.  165 mL  1  . Insulin Pen Needle (B-D ULTRAFINE III SHORT PEN) 31G X 8 MM MISC Inject 1 pen into the skin 5 (five) times daily.  500 each  3  . Insulin Syringes, Disposable, U-100 1 ML MISC Use to inject insulin as directed up to qid  Dx 250.02 - adult onset DM now requiring multiple daily injections for control  200 each  2  . lamoTRIgine (LAMICTAL) 200 MG tablet Take 1 tablet (200 mg total) by mouth 2 (two) times daily.  180 tablet  3  . levETIRAcetam (KEPPRA) 750 MG tablet Take 750 mg by mouth 2 (two) times daily.      . methocarbamol (ROBAXIN) 750 MG tablet Take 1 tablet (750 mg total) by mouth 2 (two) times daily as needed.  180 tablet  3  . Multiple Vitamin (MULTIVITAMIN WITH MINERALS) TABS Take 1 tablet by mouth daily.      . potassium chloride (K-DUR,KLOR-CON) 10 MEQ tablet Take 30 mEq by mouth daily.      . pregabalin (LYRICA) 50 MG capsule Take 1 capsule (50 mg total) by mouth 3 (three) times daily.  270 capsule  3  . simvastatin (ZOCOR) 20 MG tablet Take 1 tablet (20 mg total) by mouth at bedtime.  90 tablet  3  . tamsulosin (FLOMAX) 0.4 MG CAPS capsule Take 1 capsule (0.4 mg total) by mouth daily.  90 capsule  3  . testosterone (ANDROGEL) 50 MG/5GM GEL Place 5 g onto the skin daily. Apply to shoulder daily  90 Tube  1  . warfarin (COUMADIN) 5 MG tablet Take 1.5-2 tablets (7.5-10 mg total) by mouth daily. Take 1.5 tab every day except thursdays take 2 tabs  144 tablet  3  . zolpidem (AMBIEN) 5 MG tablet Take 0.5 tablets (2.5 mg total) by mouth at bedtime as needed.  For sleep  30 tablet  3  . [DISCONTINUED] potassium chloride (K-DUR) 10 MEQ tablet Take 3 tablets (30 mEq total) by mouth daily.  270 tablet  0   No current facility-administered medications for this visit.     Allergies (verified) Bee venom; Penicillins; Iohexol; Shellfish allergy; Iodine; and Latex   PAST HISTORY  Family History Family History  Problem Relation Age of Onset  . Liver cancer Mother   . Cancer Mother     breast  . Arthritis Father   . Deep vein thrombosis Father     on warfarin    Social History History  Substance Use Topics  . Smoking status: Never Smoker   . Smokeless tobacco: Never Used  . Alcohol Use: No    Are there smokers in your home (other than you)?  No  Risk Factors Current exercise habits: The patient does not participate in regular exercise at present.  Dietary issues discussed: none today   Cardiac risk factors: diabetes mellitus, dyslipidemia, male gender, obesity (BMI >= 30 kg/m2) and sedentary lifestyle.  Depression Screen (Note: if answer to either of the following is "Yes", a more complete depression screening is indicated)   Q1: Over the past two weeks, have you felt down, depressed or hopeless? No  Q2: Over the past two weeks, have you felt little interest or pleasure in doing things? No  Have you lost  interest or pleasure in daily life? No  Do you often feel hopeless? No  Do you cry easily over simple problems? No  Activities of Daily Living In your present state of health, do you have any difficulty performing the following activities?:  Driving? No Managing money?  No Feeding yourself? No Getting from bed to chair? Yes - requires assistance Climbing a flight of stairs? Yes Preparing food and eating?: No Bathing or showering? Yes Getting dressed: Yes Getting to the toilet? No Using the toilet:No Moving around from place to place: Yes In the past year have you fallen or had a near fall?:No   Are you sexually active?   Yes  Do you have more than one partner?  No  Hearing Difficulties: Yes Do you often ask people to speak up or repeat themselves? Yes Do you experience ringing or noises in your ears? No Do you have difficulty understanding soft or whispered voices? No   Do you feel that you have a problem with memory? No  Do you often misplace items? No  Do you feel safe at home?  Yes  Cognitive Testing  Alert? Yes  Normal Appearance?Yes  Oriented to person? Yes  Place? Yes   Time? Yes  Recall of three objects?  Yes  Can perform simple calculations? Yes  Displays appropriate judgment?Yes  Can read the correct time from a watch face?Yes   Advanced Directives have been discussed with the patient? Yes   List the Names of Other Physician/Practitioners you currently use: 1.  Corliss Parish - Nephrologist 2.  Crystal Rose - neurologist ([place]) 3.  [facility] ([facility] in [place]) 4.  Dr Hassell Done - opthamologist at [place].  Peter Martinique - cardiologist  Indicate any recent Medical Services you may have received from other than [facility] providers in the past year (date may be approximate).  Immunization History  Administered Date(s) Administered  . Influenza Split 05/11/2012  . Influenza,inj,Quad PF,36+ Mos 06/14/2013  . Pneumococcal Polysaccharide-23 09/28/2013  . Tdap 09/28/2013    Screening Tests Health Maintenance  Topic Date Due  . Foot Exam  09/29/1976  . Urine Microalbumin  09/29/1976  . Hemoglobin A1c  01/18/2014  . Influenza Vaccine  01/27/2014  . Ophthalmology Exam  03/03/2014  . Pneumococcal Polysaccharide Vaccine (##2) 09/29/2018  . Tetanus/tdap  09/29/2023    All answers were reviewed with the patient and necessary referrals were made:  Cherre Robins, [facility]   09/29/2013   History reviewed: allergies, current medications, past family history, past medical history, past social history, past surgical history and problem list  Patient is currently  using fentanyl patches 48mcg/hr and changes every 3 days and hydrocodone/APAP as needed for BTP daily Reports pain 6/10 most days but lowest 4/10 and highest 8/10 Denies constipation or daytime drowsiness.  Patient and his wife have recently moved to [facility] and he is preparing his own food (previsouly was living with in laws) and reports eating better than in the past.    Objective:      Blood pressure 124/72, pulse 87, height 6\' 3"  (1.905 m), weight 0 lb (0 kg). Body mass index is 0.00 kg/(m^2).  No weight due to limitations of scales.  Therefore no BMI     Assessment:     Annual Medicare Wellness Visit      Plan:     During the course of the visit the patient was educated and counseled about appropriate screening and preventive services including:  Pneumococcal vaccine   Influenza vaccine  Hepatitis B vaccine  Td vaccine  Glaucoma screening  Nutrition counseling   Advanced directives: discussed - patient and wife given AD packet from [facility].  Diet review for nutrition referral? Patient refused Referral to Audiologist (patient requested in [facility]) Prevnar and Boostrix given today Rx for pain medications given today - signed by Dietrich Pates, NP since patient's regular PCP not in office today. Patient was also given a urine cup to get sample for urine microalbumin to bring in at next months appt.   Patient Instructions (the written plan) was given to the patient.  Medicare Attestation I have personally reviewed: The patient's medical and social history Their use of alcohol, tobacco or illicit drugs Their current medications and supplements The patient's functional ability including ADLs,fall risks, home safety risks, cognitive, and hearing and visual impairment Diet and physical activities Evidence for depression or mood disorders  The patient's BP, HR and height have been recorded in the chart.  I have made referrals, counseling, and  provided education to the patient based on review of the above and I have provided the patient with a written personalized care plan for preventive services.     Cherre Robins, [facility]   09/29/2013

## 2013-09-28 NOTE — Patient Instructions (Signed)
Health Maintenance Summary    PNEUMOCOCCAL POLYSACCHARIDE VACCINE Overdue 09/29/1968  getting today    FOOT EXAM Overdue 09/29/1976  next visit 10/2013    URINE MICROALBUMIN Overdue 09/29/1976  next visit 10/2013    TETANUS/TDAP Overdue 09/29/1985  getting today    HEMOGLOBIN A1C Next Due 01/18/2014      INFLUENZA VACCINE Next Due 01/27/2014      OPHTHALMOLOGY EXAM Next Due 03/03/2014         Anticoagulation Dose Instructions as of 09/28/2013     [name] Tue Wed Thu Fri Sat   New Dose 5 mg 5 mg 7.5 mg 5 mg 5 mg 5 mg 7.5 mg    Description        continue 1 and 1/2 tuesdays and saturdays, 1 tablet all other days        We are making referral for Audiologist visit You were provided with cup to bring urine sample back next month to check for protein in urine    Preventive Care for Adults, Male A healthy lifestyle and preventive care can promote health and wellness. Preventive health guidelines for men include the following key practices:  A routine yearly physical is a good way to check with your health care provider about your health and preventative screening. It is a chance to share any concerns and updates on your health and to receive a thorough exam.  Visit your dentist for a routine exam and preventative care every 6 months. Brush your teeth twice a day and floss once a day. Good oral hygiene prevents tooth decay and gum disease.  The frequency of eye exams is based on your age, health, family medical history, use of contact lenses, and other factors. Follow your health care provider's recommendations for frequency of eye exams.  Eat a healthy diet. Foods such as vegetables, fruits, whole grains, low-fat dairy products, and lean protein foods contain the nutrients you need without too many calories. Decrease your intake of foods high in solid fats, added sugars, and salt. Eat the right amount of calories for you.Get information about a proper diet from your health care provider, if  necessary.  Regular physical exercise is one of the most important things you can do for your health. Most adults should get at least 150 minutes of moderate-intensity exercise (any activity that increases your heart rate and causes you to sweat) each week. In addition, most adults need muscle-strengthening exercises on 2 or more days a week.  Maintain a healthy weight. The body mass index (BMI) is a screening tool to identify possible weight problems. It provides an estimate of body fat based on height and weight. Your health care provider can find your BMI and can help you achieve or maintain a healthy weight.For adults 20 years and older:  A BMI below 18.5 is considered underweight.  A BMI of 18.5 to 24.9 is normal.  A BMI of 25 to 29.9 is considered overweight.  A BMI of 30 and above is considered obese.  Maintain normal blood lipids and cholesterol levels by exercising and minimizing your intake of saturated fat. Eat a balanced diet with plenty of fruit and vegetables. Blood tests for lipids and cholesterol should begin at age 50 and be repeated every 5 years. If your lipid or cholesterol levels are high, you are over 50, or you are at high risk for heart disease, you may need your cholesterol levels checked more frequently.Ongoing high lipid and cholesterol levels should be treated with  medicines if diet and exercise are not working.  If you smoke, find out from your health care provider how to quit. If you do not use tobacco, do not start.  Lung cancer screening is recommended for adults aged 91 80 years who are at high risk for developing lung cancer because of a history of smoking. A yearly low-dose CT scan of the lungs is recommended for people who have at least a 30-pack-year history of smoking and are a current smoker or have quit within the past 15 years. A pack year of smoking is smoking an average of 1 pack of cigarettes a day for 1 year (for example: 1 pack a day for 30 years or 2  packs a day for 15 years). Yearly screening should continue until the smoker has stopped smoking for at least 15 years. Yearly screening should be stopped for people who develop a health problem that would prevent them from having lung cancer treatment.  If you choose to drink alcohol, do not have more than 2 drinks per day. One drink is considered to be 12 ounces (355 mL) of beer, 5 ounces (148 mL) of wine, or 1.5 ounces (44 mL) of liquor.  Avoid use of street drugs. Do not share needles with anyone. Ask for help if you need support or instructions about stopping the use of drugs.  High blood pressure causes heart disease and increases the risk of stroke. Your blood pressure should be checked at least every 1 2 years. Ongoing high blood pressure should be treated with medicines, if weight loss and exercise are not effective.  If you are 70 47 years old, ask your health care provider if you should take aspirin to prevent heart disease.  Diabetes screening involves taking a blood sample to check your fasting blood sugar level. This should be done once every 3 years, after age 58, if you are within normal weight and without risk factors for diabetes. Testing should be considered at a younger age or be carried out more frequently if you are overweight and have at least 1 risk factor for diabetes.  Colorectal cancer can be detected and often prevented. Most routine colorectal cancer screening begins at the age of 39 and continues through age 31. However, your health care provider may recommend screening at an earlier age if you have risk factors for colon cancer. On a yearly basis, your health care provider may provide home test kits to check for hidden blood in the stool. Use of a small camera at the end of a tube to directly examine the colon (sigmoidoscopy or colonoscopy) can detect the earliest forms of colorectal cancer. Talk to your health care provider about this at age 59, when routine screening  begins. Direct exam of the colon should be repeated every 5 10 years through age 41, unless early forms of precancerous polyps or small growths are found.  People who are at an increased risk for hepatitis B should be screened for this virus. You are considered at high risk for hepatitis B if:  You were born in a country where hepatitis B occurs often. Talk with your health care provider about which countries are considered high-risk.  Your parents were born in a high-risk country and you have not received a shot to protect against hepatitis B (hepatitis B vaccine).  You have HIV or AIDS.  You use needles to inject street drugs.  You live with, or have sex with, someone who has hepatitis B.  You are a man who has sex with other men (MSM).  You get hemodialysis treatment.  You take certain medicines for conditions such as cancer, organ transplantation, and autoimmune conditions.  Hepatitis C blood testing is recommended for all people born from [birth year] through 1965 and any individual with known risks for hepatitis C.  Practice safe sex. Use condoms and avoid high-risk sexual practices to reduce the spread of sexually transmitted infections (STIs). STIs include gonorrhea, chlamydia, syphilis, trichomonas, herpes, HPV, and human immunodeficiency virus (HIV). Herpes, HIV, and HPV are viral illnesses that have no cure. They can result in disability, cancer, and death.  A one-time screening for abdominal aortic aneurysm (AAA) and surgical repair of large AAAs by ultrasound are recommended for men ages 65 to 53 years who are current or former smokers.  Healthy men should no longer receive prostate-specific antigen (PSA) blood tests as part of routine cancer screening. Talk with your health care provider about prostate cancer screening.  Testicular cancer screening is not recommended for adult males who have no symptoms. Screening includes self-exam, a health care provider exam, and other screening  tests. Consult with your health care provider about any symptoms you have or any concerns you have about testicular cancer.  Use sunscreen. Apply sunscreen liberally and repeatedly throughout the day. You should seek shade when your shadow is shorter than you. Protect yourself by wearing long sleeves, pants, a wide-brimmed hat, and sunglasses year round, whenever you are outdoors.  Once a month, do a whole-body skin exam, using a mirror to look at the skin on your back. Tell your health care provider about new moles, moles that have irregular borders, moles that are larger than a pencil eraser, or moles that have changed in shape or color.  Stay current with required vaccines (immunizations).  Influenza vaccine. All adults should be immunized every year.  Tetanus, diphtheria, and acellular pertussis (Td, Tdap) vaccine. An adult who has not previously received Tdap or who does not know his vaccine status should receive 1 dose of Tdap. This initial dose should be followed by tetanus and diphtheria toxoids (Td) booster doses every 10 years. Adults with an unknown or incomplete history of completing a 3-dose immunization series with Td-containing vaccines should begin or complete a primary immunization series including a Tdap dose. Adults should receive a Td booster every 10 years.  Varicella vaccine. An adult without evidence of immunity to varicella should receive 2 doses or a second dose if he has previously received 1 dose.  Human papillomavirus (HPV) vaccine. Males aged 39 21 years who have not received the vaccine previously should receive the 3-dose series. Males aged 59 26 years may be immunized. Immunization is recommended through the age of 60 years for any male who has sex with males and did not get any or all doses earlier. Immunization is recommended for any person with an immunocompromised condition through the age of 64 years if he did not get any or all doses earlier. During the 3-dose  series, the second dose should be obtained 4 8 weeks after the first dose. The third dose should be obtained 24 weeks after the first dose and 16 weeks after the second dose.  Zoster vaccine. One dose is recommended for adults aged 97 years or older unless certain conditions are present.  Measles, mumps, and rubella (MMR) vaccine. Adults born before 20 generally are considered immune to measles and mumps. Adults born in [birth year] or later should have 1 or more doses  of MMR vaccine unless there is a contraindication to the vaccine or there is laboratory evidence of immunity to each of the three diseases. A routine second dose of MMR vaccine should be obtained at least 28 days after the first dose for students attending postsecondary schools, health care workers, or international travelers. People who received inactivated measles vaccine or an unknown type of measles vaccine during 1963 1967 should receive 2 doses of MMR vaccine. People who received inactivated mumps vaccine or an unknown type of mumps vaccine before 1979 and are at high risk for mumps infection should consider immunization with 2 doses of MMR vaccine. Unvaccinated health care workers born before [birth year] who lack laboratory evidence of measles, mumps, or rubella immunity or laboratory confirmation of disease should consider measles and mumps immunization with 2 doses of MMR vaccine or rubella immunization with 1 dose of MMR vaccine.  Pneumococcal 13-valent conjugate (PCV13) vaccine. When indicated, a person who is uncertain of his immunization history and has no record of immunization should receive the PCV13 vaccine. An adult aged 102 years or older who has certain medical conditions and has not been previously immunized should receive 1 dose of PCV13 vaccine. This PCV13 should be followed with a dose of pneumococcal polysaccharide (PPSV23) vaccine. The PPSV23 vaccine dose should be obtained at least 8 weeks after the dose of PCV13 vaccine. An adult  aged 26 years or older who has certain medical conditions and previously received 1 or more doses of PPSV23 vaccine should receive 1 dose of PCV13. The PCV13 vaccine dose should be obtained 1 or more years after the last PPSV23 vaccine dose.  Pneumococcal polysaccharide (PPSV23) vaccine. When PCV13 is also indicated, PCV13 should be obtained first. All adults aged 73 years and older should be immunized. An adult younger than age 33 years who has certain medical conditions should be immunized. Any person who resides in a nursing home or long-term care facility should be immunized. An adult smoker should be immunized. People with an immunocompromised condition and certain other conditions should receive both PCV13 and PPSV23 vaccines. People with human immunodeficiency virus (HIV) infection should be immunized as soon as possible after diagnosis. Immunization during chemotherapy or radiation therapy should be avoided. Routine use of PPSV23 vaccine is not recommended for American Indians, [place] Natives, or people younger than 65 years unless there are medical conditions that require PPSV23 vaccine. When indicated, people who have unknown immunization and have no record of immunization should receive PPSV23 vaccine. One-time revaccination 5 years after the first dose of PPSV23 is recommended for people aged 72 64 years who have chronic kidney failure, nephrotic syndrome, asplenia, or immunocompromised conditions. People who received 1 2 doses of PPSV23 before age 60 years should receive another dose of PPSV23 vaccine at age 24 years or later if at least 5 years have passed since the previous dose. Doses of PPSV23 are not needed for people immunized with PPSV23 at or after age 80 years.  Meningococcal vaccine. Adults with asplenia or persistent complement component deficiencies should receive 2 doses of quadrivalent meningococcal conjugate (MenACWY-D) vaccine. The doses should be obtained at least 2 months apart.  Microbiologists working with certain meningococcal bacteria, [place] recruits, people at risk during an outbreak, and people who travel to or live in countries with a high rate of meningitis should be immunized. A first-year college student up through age 65 years who is living in a residence hall should receive a dose if he did not receive a dose  on or after his 16th birthday. Adults who have certain high-risk conditions should receive one or more doses of vaccine.  Hepatitis A vaccine. Adults who wish to be protected from this disease, have certain high-risk conditions, work with hepatitis A-infected animals, work in hepatitis A research labs, or travel to or work in countries with a high rate of hepatitis A should be immunized. Adults who were previously unvaccinated and who anticipate close contact with an international adoptee during the first 60 days after arrival in the [place] from a country with a high rate of hepatitis A should be immunized.  Hepatitis B vaccine. Adults who wish to be protected from this disease, have certain high-risk conditions, may be exposed to blood or other infectious body fluids, are household contacts or sex partners of hepatitis B positive people, are clients or workers in certain care facilities, or travel to or work in countries with a high rate of hepatitis B should be immunized.  Haemophilus influenzae type b (Hib) vaccine. A previously unvaccinated person with asplenia or sickle cell disease or having a scheduled splenectomy should receive 1 dose of Hib vaccine. Regardless of previous immunization, a recipient of a hematopoietic stem cell transplant should receive a 3-dose series 6 12 months after his successful transplant. Hib vaccine is not recommended for adults with HIV infection. Preventive Service / Frequency Ages 55 to 60  Blood pressure check.** / Every 1 to 2 years.  Lipid and cholesterol check.** / Every 5 years beginning at age 49.  Hepatitis C  blood test.** / For any individual with known risks for hepatitis C.  Skin self-exam. / Monthly.  Influenza vaccine. / Every year.  Tetanus, diphtheria, and acellular pertussis (Tdap, Td) vaccine.** / Consult your health care provider. 1 dose of Td every 10 years.  Varicella vaccine.** / Consult your health care provider.  HPV vaccine. / 3 doses over 6 months, if 20 or younger.  Measles, mumps, rubella (MMR) vaccine.** / You need at least 1 dose of MMR if you were born in 1957 or later. You may also need a second dose.  Pneumococcal 13-valent conjugate (PCV13) vaccine.** / Consult your health care provider.  Pneumococcal polysaccharide (PPSV23) vaccine.** / 1 to 2 doses if you smoke cigarettes or if you have certain conditions.  Meningococcal vaccine.** / 1 dose if you are age 60 to 85 years and a Market researcher living in a residence hall, or have one of several medical conditions. You may also need additional booster doses.  Hepatitis A vaccine.** / Consult your health care provider.  Hepatitis B vaccine.** / Consult your health care provider.  Haemophilus influenzae type b (Hib) vaccine.** / Consult your health care provider. Ages 72 to 60  Blood pressure check.** / Every 1 to 2 years.  Lipid and cholesterol check.** / Every 5 years beginning at age 27.  Lung cancer screening. / Every year if you are aged 23 80 years and have a 30-pack-year history of smoking and currently smoke or have quit within the past 15 years. Yearly screening is stopped once you have quit smoking for at least 15 years or develop a health problem that would prevent you from having lung cancer treatment.  Fecal occult blood test (FOBT) of stool. / Every year beginning at age 93 and continuing until age 64. You may not have to do this test if you get a colonoscopy every 10 years.  Flexible sigmoidoscopy** or colonoscopy.** / Every 5 years for a flexible sigmoidoscopy  or every 10 years for a  colonoscopy beginning at age 65 and continuing until age 40.  Hepatitis C blood test.** / For all people born from [birth year] through 1964 and any individual with known risks for hepatitis C.  Skin self-exam. / Monthly.  Influenza vaccine. / Every year.  Tetanus, diphtheria, and acellular pertussis (Tdap/Td) vaccine.** / Consult your health care provider. 1 dose of Td every 10 years.  Varicella vaccine.** / Consult your health care provider.  Zoster vaccine.** / 1 dose for adults aged 40 years or older.  Measles, mumps, rubella (MMR) vaccine.** / You need at least 1 dose of MMR if you were born in 1957 or later. You may also need a second dose.  Pneumococcal 13-valent conjugate (PCV13) vaccine.** / Consult your health care provider.  Pneumococcal polysaccharide (PPSV23) vaccine.** / 1 to 2 doses if you smoke cigarettes or if you have certain conditions.  Meningococcal vaccine.** / Consult your health care provider.  Hepatitis A vaccine.** / Consult your health care provider.  Hepatitis B vaccine.** / Consult your health care provider.  Haemophilus influenzae type b (Hib) vaccine.** / Consult your health care provider.

## 2013-09-29 NOTE — Addendum Note (Signed)
Addended by: Cherre Robins on: 09/29/2013 05:24 PM   Modules accepted: Level of Service

## 2013-10-05 ENCOUNTER — Other Ambulatory Visit: Payer: Self-pay

## 2013-10-11 ENCOUNTER — Encounter: Payer: Self-pay | Admitting: Cardiology

## 2013-10-11 ENCOUNTER — Ambulatory Visit (INDEPENDENT_AMBULATORY_CARE_PROVIDER_SITE_OTHER): Payer: PRIVATE HEALTH INSURANCE | Admitting: Cardiology

## 2013-10-11 VITALS — BP 121/75 | HR 65 | Ht 75.0 in | Wt >= 6400 oz

## 2013-10-11 DIAGNOSIS — G4733 Obstructive sleep apnea (adult) (pediatric): Secondary | ICD-10-CM

## 2013-10-11 DIAGNOSIS — R0902 Hypoxemia: Secondary | ICD-10-CM

## 2013-10-11 DIAGNOSIS — Z86711 Personal history of pulmonary embolism: Secondary | ICD-10-CM

## 2013-10-11 DIAGNOSIS — E119 Type 2 diabetes mellitus without complications: Secondary | ICD-10-CM

## 2013-10-11 NOTE — Patient Instructions (Signed)
We will schedule you for an Echocardiogram  Continue your other therapy  Consider wearing oxygen at night.
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
## 2013-10-16 ENCOUNTER — Encounter: Payer: Self-pay | Admitting: General Practice

## 2013-10-16 ENCOUNTER — Ambulatory Visit (INDEPENDENT_AMBULATORY_CARE_PROVIDER_SITE_OTHER): Payer: Medicare Other | Admitting: General Practice

## 2013-10-16 VITALS — BP 118/68 | HR 59 | Temp 99.0°F

## 2013-10-16 DIAGNOSIS — E291 Testicular hypofunction: Secondary | ICD-10-CM

## 2013-10-16 DIAGNOSIS — K219 Gastro-esophageal reflux disease without esophagitis: Secondary | ICD-10-CM

## 2013-10-16 DIAGNOSIS — E119 Type 2 diabetes mellitus without complications: Secondary | ICD-10-CM

## 2013-10-16 DIAGNOSIS — G894 Chronic pain syndrome: Secondary | ICD-10-CM

## 2013-10-16 DIAGNOSIS — F3289 Other specified depressive episodes: Secondary | ICD-10-CM

## 2013-10-16 DIAGNOSIS — F329 Major depressive disorder, single episode, unspecified: Secondary | ICD-10-CM

## 2013-10-16 DIAGNOSIS — E785 Hyperlipidemia, unspecified: Secondary | ICD-10-CM

## 2013-10-16 DIAGNOSIS — F32A Depression, unspecified: Secondary | ICD-10-CM

## 2013-10-16 LAB — POCT GLYCOSYLATED HEMOGLOBIN (HGB A1C): Hemoglobin A1C: 7.7

## 2013-10-16 MED ORDER — CITALOPRAM HYDROBROMIDE 20 MG PO TABS: ORAL_TABLET | ORAL | Status: DC

## 2013-10-16 MED ORDER — TESTOSTERONE 50 MG/5GM (1%) TD GEL: 5.0000 g | Freq: Every day | TRANSDERMAL | Status: DC

## 2013-10-16 MED ORDER — FENTANYL 75 MCG/HR TD PT72: 75.0000 ug | MEDICATED_PATCH | TRANSDERMAL | Status: DC

## 2013-10-16 MED ORDER — HYDROCODONE-ACETAMINOPHEN 7.5-325 MG PO TABS: 1.0000 | ORAL_TABLET | Freq: Every day | ORAL | Status: DC

## 2013-10-16 MED ORDER — ESOMEPRAZOLE MAGNESIUM 40 MG PO CPDR: 40.0000 mg | DELAYED_RELEASE_CAPSULE | Freq: Every day | ORAL | Status: DC

## 2013-10-16 NOTE — Patient Instructions (Signed)

## 2013-10-16 NOTE — Progress Notes (Signed)
   Subjective:    Patient ID: [name], male    DOB: May 28, 1966, 48 y.o.   MRN: [id number]  HPI Patient presents today for chronic health follow up. History of BPH, ED, diabetes, depression, HLD, hypogonadism, gerd, seizures, hypokalemia, allergic rhinitis, and chronic pain. Taking medications as prescribed. Denies mood swings, thoughts of harming self or others. Eating a healthy diet. Denies complaints at this time.     Review of Systems  Constitutional: Negative for fever and chills.  HENT: Negative for facial swelling.   Respiratory: Negative for cough, chest tightness and shortness of breath.   Cardiovascular: Negative for chest pain and palpitations.  Gastrointestinal: Negative for nausea, vomiting, abdominal pain, diarrhea, constipation and blood in stool.  Genitourinary: Negative for flank pain and difficulty urinating.  Musculoskeletal: Positive for back pain.       Chronic back pain  Neurological: Negative for dizziness, weakness and headaches.       Objective:   Physical Exam  Constitutional: He is oriented to person, place, and time. He appears well-developed and well-nourished.  obese  HENT:  Head: Normocephalic and atraumatic.  Right Ear: External ear normal.  Left Ear: External ear normal.  Mouth/Throat: Oropharynx is clear and moist.  Eyes: Conjunctivae and EOM are normal.  Neck: Normal range of motion. Neck supple. No JVD present. No thyromegaly present.  Cardiovascular: Normal rate, regular rhythm and normal heart sounds.   Pulmonary/Chest: Effort normal and breath sounds normal. No respiratory distress. He exhibits no tenderness.  Abdominal: Soft. Bowel sounds are normal. He exhibits no distension. There is no tenderness. There is no CVA tenderness, no tenderness at McBurney's point and negative Murphy's sign.     Neurological: He is alert and oriented to person, place, and time.  Skin: Skin is warm and dry.  Psychiatric: He has a normal mood and  affect.          Assessment & Plan:  1. Diabetes  - POCT glycosylated hemoglobin (Hb A1C)  2. Hyperlipidemia  - Lipid panel  3. Depression  - citalopram (CELEXA) 20 MG tablet; Take 3 tablets at bedtime (60 mg total)  Dispense: 270 tablet; Refill: 1  4. Hypogonadism male  - testosterone (ANDROGEL) 50 MG/5GM (1%) GEL; Place 5 g onto the skin daily. Apply to shoulder daily  Dispense: 90 Tube; Refill: 1  5. GERD (gastroesophageal reflux disease)  - esomeprazole (NEXIUM) 40 MG capsule; Take 1 capsule (40 mg total) by mouth daily before breakfast.  Dispense: 90 capsule; Refill: 2  6. Chronic pain syndrome  - HYDROcodone-acetaminophen (NORCO) 7.5-325 MG per tablet; Take 1 tablet by mouth daily. As needed for BTP (1 tablet per day)  Dispense: 30 tablet; Refill: 0 - fentaNYL (DURAGESIC) 75 MCG/HR; Place 1 patch (75 mcg total) onto the skin every 3 (three) days.  Dispense: 10 patch; Refill: 0 -Continue all current medications Labs pending F/u in 3 months Discussed benefits of regular exercise and healthy eating -Patient verbalized understanding Erby Pian, FNP-C

## 2013-10-17 LAB — LIPID PANEL
Chol/HDL Ratio: 3.3 ratio units (ref 0.0–5.0)
Cholesterol, Total: 107 mg/dL (ref 100–199)
HDL: 32 mg/dL — ABNORMAL LOW (ref >39)
LDL Calculated: 51 mg/dL (ref 0–99)
Triglycerides: 121 mg/dL (ref 0–149)
VLDL Cholesterol Cal: 24 mg/dL (ref 5–40)

## 2013-10-18 NOTE — Telephone Encounter (Signed)
done

## 2013-10-18 NOTE — Telephone Encounter (Signed)
invokana and robaxin both prior authorized x 1 yr.

## 2013-10-20 ENCOUNTER — Ambulatory Visit: Payer: Medicare Other | Admitting: General Practice

## 2013-10-24 ENCOUNTER — Ambulatory Visit (HOSPITAL_COMMUNITY): Payer: PRIVATE HEALTH INSURANCE | Attending: Cardiovascular Disease | Admitting: Cardiology

## 2013-10-24 ENCOUNTER — Encounter: Payer: Self-pay | Admitting: Cardiovascular Disease

## 2013-10-24 DIAGNOSIS — R0602 Shortness of breath: Secondary | ICD-10-CM

## 2013-10-24 DIAGNOSIS — E119 Type 2 diabetes mellitus without complications: Secondary | ICD-10-CM | POA: Insufficient documentation

## 2013-10-24 DIAGNOSIS — G4733 Obstructive sleep apnea (adult) (pediatric): Secondary | ICD-10-CM | POA: Insufficient documentation

## 2013-10-24 DIAGNOSIS — R0902 Hypoxemia: Secondary | ICD-10-CM

## 2013-10-24 DIAGNOSIS — Z86711 Personal history of pulmonary embolism: Secondary | ICD-10-CM

## 2013-10-24 NOTE — Progress Notes (Signed)
Echo performed. 

## 2013-10-27 ENCOUNTER — Ambulatory Visit: Payer: Medicare Other | Admitting: General Practice

## 2013-11-02 ENCOUNTER — Ambulatory Visit (INDEPENDENT_AMBULATORY_CARE_PROVIDER_SITE_OTHER): Payer: Medicare Other | Admitting: Pharmacist

## 2013-11-02 ENCOUNTER — Encounter: Payer: Self-pay | Admitting: Pharmacist

## 2013-11-02 VITALS — BP 124/78 | HR 77

## 2013-11-02 DIAGNOSIS — G894 Chronic pain syndrome: Secondary | ICD-10-CM

## 2013-11-02 DIAGNOSIS — J810 Acute pulmonary edema: Secondary | ICD-10-CM

## 2013-11-02 LAB — POCT INR: INR: 2.6

## 2013-11-02 MED ORDER — HYDROCODONE-ACETAMINOPHEN 7.5-325 MG PO TABS
1.0000 | ORAL_TABLET | Freq: Every day | ORAL | Status: DC
Start: 2013-11-02 — End: 2013-12-06

## 2013-11-02 MED ORDER — FENTANYL 75 MCG/HR TD PT72
75.0000 ug | MEDICATED_PATCH | TRANSDERMAL | Status: DC
Start: 2013-11-02 — End: 2013-12-06

## 2013-11-02 NOTE — Patient Instructions (Signed)
Anticoagulation Dose Instructions as of 11/02/2013     [name] Tue Wed Thu Fri Sat   New Dose 5 mg 5 mg 7.5 mg 5 mg 5 mg 5 mg 7.5 mg    Description        continue 1 and 1/2 tuesdays and saturdays, 1 tablet all other days      INR was 2.6 today

## 2013-11-02 NOTE — Addendum Note (Signed)
Addended by: Cherre Robins on: 11/02/2013 12:24 PM   Modules accepted: Orders

## 2013-11-02 NOTE — Progress Notes (Signed)
Subjective:    [name] is a 48 y.o. male who presents for follow up chronic pain and recheck INR   Patient is currently using fentanyl patches 62mcg/hr and changes every 3 days and hydrocodone/APAP as needed for BTP daily Reports pain 5/10 most days but lowest 4/10 and highest 9/10 Patient appears very sleepy today and when asked about daytime drowsiness he states that he did not sleep well last pm due to muscle cramps.  He took cyclobenzaprine 10mg  1/2 tablet.  He was more active yesterday than usual and believes that is why he had cramping.  Daytime drowsiness is not common per patient and wife.  Denies constipation.     Current Medications (verified) Current Outpatient Prescriptions  Medication Sig Dispense Refill  . ACCU-CHEK SOFTCLIX LANCETS lancets 1 each by Other route 4 (four) times daily. Use as instructed  200 each  3  . B-D ULTRAFINE III SHORT PEN 31G X 8 MM MISC 5 INJECTIONS DAILY-- DX 250.02  200 each  2  . busPIRone (BUSPAR) 15 MG tablet Take 1 tablet (15 mg total) by mouth 2 (two) times daily.  180 tablet  3  . Canagliflozin (INVOKANA) 100 MG TABS Take 1 tablet (100 mg total) by mouth daily.  90 tablet  3  . citalopram (CELEXA) 20 MG tablet Take 3 tablets at bedtime (60 mg total)  270 tablet  1  . cyclobenzaprine (FLEXERIL) 10 MG tablet Take 0.5 tablets (5 mg total) by mouth daily as needed for muscle spasms.  30 tablet  3  . EPINEPHrine (EPIPEN 2-PAK) 0.3 mg/0.3 mL SOAJ injection Inject 0.3 mLs (0.3 mg total) into the muscle once. As needed for anaphylactic reaction  2 Device  0  . esomeprazole (NEXIUM) 40 MG capsule Take 1 capsule (40 mg total) by mouth daily before breakfast.  90 capsule  2  . fentaNYL (DURAGESIC) 75 MCG/HR Place 1 patch (75 mcg total) onto the skin every 3 (three) days.  10 patch  0  . fluticasone (FLONASE) 50 MCG/ACT nasal spray Place 1 spray into both nostrils 2 (two) times daily.  48 g  3  . furosemide (LASIX) 80 MG tablet Take 1 tablet (80 mg  total) by mouth 2 (two) times daily.  180 tablet  3  . glucose blood (ACCU-CHEK COMPACT PLUS) test strip Use to check BG up to 4 times daily.  Dx: 250.02 type 2 DM, not at goals  400 each  0  . HYDROcodone-acetaminophen (NORCO) 7.5-325 MG per tablet Take 1 tablet by mouth daily. As needed for BTP (1 tablet per day)  30 tablet  0  . insulin aspart (NOVOLOG FLEXPEN) 100 UNIT/ML FlexPen Inject 35 Units into the skin 3 (three) times daily with meals.  105 mL  3  . Insulin Glargine (LANTUS SOLOSTAR) 100 UNIT/ML Solostar Pen Inject 95 Units into the skin 2 (two) times daily.  165 mL  1  . Insulin Pen Needle (B-D ULTRAFINE III SHORT PEN) 31G X 8 MM MISC Inject 1 pen into the skin 5 (five) times daily.  500 each  3  . Insulin Syringes, Disposable, U-100 1 ML MISC Use to inject insulin as directed up to qid  Dx 250.02 - adult onset DM now requiring multiple daily injections for control  200 each  2  . lamoTRIgine (LAMICTAL) 200 MG tablet Take 1 tablet (200 mg total) by mouth 2 (two) times daily.  180 tablet  3  . levETIRAcetam (KEPPRA) 750 MG tablet Take  750 mg by mouth 2 (two) times daily.      . methocarbamol (ROBAXIN) 750 MG tablet Take 1 tablet (750 mg total) by mouth 2 (two) times daily as needed.  180 tablet  3  . Multiple Vitamin (MULTIVITAMIN WITH MINERALS) TABS Take 1 tablet by mouth daily.      . potassium chloride (K-DUR,KLOR-CON) 10 MEQ tablet Take 30 mEq by mouth daily.      . pregabalin (LYRICA) 50 MG capsule Take 1 capsule (50 mg total) by mouth 3 (three) times daily.  270 capsule  3  . rizatriptan (MAXALT) 10 MG tablet Take 20 mg by mouth once a week.       . simvastatin (ZOCOR) 20 MG tablet Take 1 tablet (20 mg total) by mouth at bedtime.  90 tablet  3  . tamsulosin (FLOMAX) 0.4 MG CAPS capsule Take 1 capsule (0.4 mg total) by mouth daily.  90 capsule  3  . testosterone (ANDROGEL) 50 MG/5GM (1%) GEL Place 5 g onto the skin daily. Apply to shoulder daily  90 Tube  1  . warfarin (COUMADIN) 5  MG tablet Take 1.5-2 tablets (7.5-10 mg total) by mouth daily. Take 1.5 tab every day except thursdays take 2 tabs  144 tablet  3  . zolpidem (AMBIEN) 5 MG tablet Take 0.5 tablets (2.5 mg total) by mouth at bedtime as needed. For sleep  30 tablet  3  . [DISCONTINUED] potassium chloride (K-DUR) 10 MEQ tablet Take 3 tablets (30 mEq total) by mouth daily.  270 tablet  0   No current facility-administered medications for this visit.     Allergies (verified) Bee venom; Penicillins; Iohexol; Shellfish allergy; Iodine; and Latex    Objective:      Blood pressure 124/78, pulse 77. There is no weight on file to calculate BMI.  No weight due to limitations of scales.  Therefore no BMI  INR was 2.6 today     Assessment:     Chronic pain - controlled therpautic anticoagulation     Plan:    Rx for pain medications - fentanyl and hydrocodone/APAP given today - signed by Dr Redge Gainer since patient's regular PCP not in office today. Patient was also given a urine cup at last visit to get sample for urine microalbumin - reminded him to bring to next month's appt. May take cyclobenzeprine 10mg  1/2 to 1 tablet as needed for muscle spasms/cramping  Anticoagulation Dose Instructions as of 11/02/2013     Dorene Grebe Tue Wed Thu Fri Sat   New Dose 5 mg 5 mg 7.5 mg 5 mg 5 mg 5 mg 7.5 mg    Description        continue 1 and 1/2 tuesdays and saturdays, 1 tablet all other days     RTC in 6 weeks.   Cherre Robins, PharmD, CPP

## 2013-12-04 ENCOUNTER — Telehealth: Payer: Self-pay | Admitting: Family Medicine

## 2013-12-04 NOTE — Telephone Encounter (Signed)
appt given for wed with bill oxford

## 2013-12-06 ENCOUNTER — Ambulatory Visit (INDEPENDENT_AMBULATORY_CARE_PROVIDER_SITE_OTHER): Payer: Medicare Other | Admitting: Family Medicine

## 2013-12-06 VITALS — BP 101/67 | HR 66 | Temp 98.8°F

## 2013-12-06 DIAGNOSIS — M549 Dorsalgia, unspecified: Secondary | ICD-10-CM

## 2013-12-06 DIAGNOSIS — I82409 Acute embolism and thrombosis of unspecified deep veins of unspecified lower extremity: Secondary | ICD-10-CM

## 2013-12-06 DIAGNOSIS — E119 Type 2 diabetes mellitus without complications: Secondary | ICD-10-CM

## 2013-12-06 DIAGNOSIS — G894 Chronic pain syndrome: Secondary | ICD-10-CM

## 2013-12-06 DIAGNOSIS — R350 Frequency of micturition: Secondary | ICD-10-CM

## 2013-12-06 DIAGNOSIS — J810 Acute pulmonary edema: Secondary | ICD-10-CM

## 2013-12-06 LAB — POCT UA - MICROSCOPIC ONLY
Bacteria, U Microscopic: NEGATIVE
Casts, Ur, LPF, POC: NEGATIVE
Crystals, Ur, HPF, POC: NEGATIVE
RBC, urine, microscopic: NEGATIVE
Yeast, UA: NEGATIVE

## 2013-12-06 LAB — POCT URINALYSIS DIPSTICK
Bilirubin, UA: NEGATIVE
Blood, UA: NEGATIVE
Glucose, UA: NEGATIVE
Ketones, UA: NEGATIVE
Leukocytes, UA: NEGATIVE
Nitrite, UA: NEGATIVE
Spec Grav, UA: 1.01
Urobilinogen, UA: NEGATIVE
pH, UA: 6.5

## 2013-12-06 LAB — POCT UA - MICROALBUMIN: Microalbumin Ur, POC: NEGATIVE mg/L

## 2013-12-06 LAB — POCT INR: INR: 2.2

## 2013-12-06 MED ORDER — HYDROCODONE-ACETAMINOPHEN 7.5-325 MG PO TABS: 1.0000 | ORAL_TABLET | Freq: Every day | ORAL | Status: DC

## 2013-12-06 MED ORDER — FENTANYL 75 MCG/HR TD PT72: 75.0000 ug | MEDICATED_PATCH | TRANSDERMAL | Status: DC

## 2013-12-06 MED ORDER — KETOROLAC TROMETHAMINE 60 MG/2ML IM SOLN
60.0000 mg | Freq: Once | INTRAMUSCULAR | Status: AC
  Administered 2013-12-06: 60 mg via INTRAMUSCULAR

## 2013-12-06 MED ORDER — METHYLPREDNISOLONE (PAK) 4 MG PO TABS: ORAL_TABLET | ORAL | Status: DC

## 2013-12-06 NOTE — Progress Notes (Signed)
   Subjective:    Patient ID: [name], male    DOB: 1966/02/14, 48 y.o.   MRN: [id number]  HPI This 48 y.o. male presents for evaluation of acute back pain on top of chronic back pain. He has been having lower back discomfort and denies any radicular sx's.  He states he needs refills On his narcotic pain medications.   Review of Systems C/o back pain No chest pain, SOB, HA, dizziness, vision change, N/V, diarrhea, constipation, dysuria, urinary urgency or frequency, myalgias, arthralgias or rash.     Objective:   Physical Exam Vital signs noted  Well developed well nourished obese male in hoverround wheelchair.  HEENT - Head atraumatic Normocephalic                Eyes - PERRLA, Conjuctiva - clear Sclera- Clear EOMI                Ears - EAC's Wnl TM's Wnl Gross Hearing WNL                Throat - oropharanx wnl Respiratory - Lungs CTA bilateral Cardiac - RRR S1 and S2 without murmur GI - Abdomen soft Nontender and bowel sounds active x 4 Extremities - No edema. Neuro - Grossly intact.   Results for orders placed in visit on 12/06/13  POCT UA - MICROALBUMIN      Result Value Ref Range   Microalbumin Ur, POC neg    POCT UA - MICROSCOPIC ONLY      Result Value Ref Range   WBC, Ur, HPF, POC rare     RBC, urine, microscopic neg     Bacteria, U Microscopic neg     Mucus, UA occ     Epithelial cells, urine per micros rare     Crystals, Ur, HPF, POC neg     Casts, Ur, LPF, POC neg     Yeast, UA neg    POCT URINALYSIS DIPSTICK      Result Value Ref Range   Color, UA yellow     Clarity, UA clear     Glucose, UA neg     Bilirubin, UA neg     Ketones, UA neg     Spec Grav, UA 1.010     Blood, UA neg     pH, UA 6.5     Protein, UA 2++     Urobilinogen, UA negative     Nitrite, UA neg     Leukocytes, UA Negative        Assessment & Plan:  Back pain - Plan: POCT UA - Microalbumin, POCT UA - Microscopic Only, POCT urinalysis dipstick, ketorolac (TORADOL)  injection 60 mg, methylPREDNIsolone (MEDROL DOSPACK) 4 MG tablet  Urinary frequency - Plan: POCT UA - Microalbumin, POCT UA - Microscopic Only, POCT urinalysis dipstick, ketorolac (TORADOL) injection 60 mg, methylPREDNIsolone (MEDROL DOSPACK) 4 MG tablet  Diabetes mellitus, type 2 - Plan: POCT UA - Microalbumin, ketorolac (TORADOL) injection 60 mg, methylPREDNIsolone (MEDROL DOSPACK) 4 MG tablet  Lysbeth Penner FNP

## 2013-12-27 ENCOUNTER — Telehealth: Payer: Self-pay | Admitting: Family

## 2014-01-01 MED ORDER — GLUCOSE BLOOD VI STRP: ORAL_STRIP | Status: DC

## 2014-01-01 NOTE — Telephone Encounter (Signed)
done

## 2014-01-08 ENCOUNTER — Other Ambulatory Visit: Payer: Self-pay | Admitting: *Deleted

## 2014-01-08 DIAGNOSIS — F329 Major depressive disorder, single episode, unspecified: Secondary | ICD-10-CM

## 2014-01-08 DIAGNOSIS — F32A Depression, unspecified: Secondary | ICD-10-CM

## 2014-01-08 NOTE — Telephone Encounter (Signed)
Received fax from [facility] for mail order. Please advise on this for this patient. If approved please print and route to pool A so nurse can call patient to pick up

## 2014-01-09 MED ORDER — PREGABALIN 50 MG PO CAPS: 50.0000 mg | ORAL_CAPSULE | Freq: Three times a day (TID) | ORAL | Status: DC

## 2014-01-19 ENCOUNTER — Ambulatory Visit: Payer: Medicare Other | Admitting: Family

## 2014-01-19 ENCOUNTER — Telehealth: Payer: Self-pay | Admitting: Family

## 2014-01-19 NOTE — Telephone Encounter (Signed)
appt scheduled

## 2014-01-24 ENCOUNTER — Ambulatory Visit (INDEPENDENT_AMBULATORY_CARE_PROVIDER_SITE_OTHER): Payer: Medicare Other | Admitting: Family

## 2014-01-24 ENCOUNTER — Encounter: Payer: Self-pay | Admitting: Family

## 2014-01-24 VITALS — BP 109/70 | HR 61 | Temp 98.5°F

## 2014-01-24 DIAGNOSIS — N058 Unspecified nephritic syndrome with other morphologic changes: Secondary | ICD-10-CM

## 2014-01-24 DIAGNOSIS — E1129 Type 2 diabetes mellitus with other diabetic kidney complication: Secondary | ICD-10-CM

## 2014-01-24 DIAGNOSIS — K2100 Gastro-esophageal reflux disease with esophagitis, without bleeding: Secondary | ICD-10-CM

## 2014-01-24 DIAGNOSIS — N400 Enlarged prostate without lower urinary tract symptoms: Secondary | ICD-10-CM | POA: Insufficient documentation

## 2014-01-24 DIAGNOSIS — E349 Endocrine disorder, unspecified: Secondary | ICD-10-CM

## 2014-01-24 DIAGNOSIS — G894 Chronic pain syndrome: Secondary | ICD-10-CM

## 2014-01-24 DIAGNOSIS — E119 Type 2 diabetes mellitus without complications: Secondary | ICD-10-CM

## 2014-01-24 DIAGNOSIS — E291 Testicular hypofunction: Secondary | ICD-10-CM

## 2014-01-24 DIAGNOSIS — F411 Generalized anxiety disorder: Secondary | ICD-10-CM | POA: Insufficient documentation

## 2014-01-24 DIAGNOSIS — G40909 Epilepsy, unspecified, not intractable, without status epilepticus: Secondary | ICD-10-CM

## 2014-01-24 DIAGNOSIS — E1165 Type 2 diabetes mellitus with hyperglycemia: Secondary | ICD-10-CM

## 2014-01-24 DIAGNOSIS — Z1321 Encounter for screening for nutritional disorder: Secondary | ICD-10-CM

## 2014-01-24 DIAGNOSIS — E785 Hyperlipidemia, unspecified: Secondary | ICD-10-CM

## 2014-01-24 DIAGNOSIS — J810 Acute pulmonary edema: Secondary | ICD-10-CM

## 2014-01-24 DIAGNOSIS — K219 Gastro-esophageal reflux disease without esophagitis: Secondary | ICD-10-CM | POA: Insufficient documentation

## 2014-01-24 DIAGNOSIS — I82409 Acute embolism and thrombosis of unspecified deep veins of unspecified lower extremity: Secondary | ICD-10-CM

## 2014-01-24 DIAGNOSIS — E1121 Type 2 diabetes mellitus with diabetic nephropathy: Secondary | ICD-10-CM

## 2014-01-24 LAB — POCT GLYCOSYLATED HEMOGLOBIN (HGB A1C): Hemoglobin A1C: 7.5

## 2014-01-24 LAB — POCT INR: INR: 2.5

## 2014-01-24 MED ORDER — HYDROCODONE-ACETAMINOPHEN 7.5-325 MG PO TABS: 1.0000 | ORAL_TABLET | Freq: Every day | ORAL | Status: DC

## 2014-01-24 MED ORDER — INSULIN GLARGINE 100 UNIT/ML SOLOSTAR PEN: 50.0000 [IU] | PEN_INJECTOR | Freq: Two times a day (BID) | SUBCUTANEOUS | Status: DC

## 2014-01-24 MED ORDER — FENTANYL 75 MCG/HR TD PT72: 75.0000 ug | MEDICATED_PATCH | TRANSDERMAL | Status: DC

## 2014-01-24 NOTE — Patient Instructions (Signed)

## 2014-01-24 NOTE — Progress Notes (Signed)
Subjective:    Patient ID: [name], male    DOB: [birth year], 48 y.o.   MRN: [id number]  Diabetes He presents for his follow-up diabetic visit. He has type 2 diabetes mellitus. His disease course has been improving. Pertinent negatives for hypoglycemia include no confusion, dizziness or nervousness/anxiousness. Associated symptoms include foot paresthesias. Pertinent negatives for diabetes include no blurred vision, no foot ulcerations and no visual change. Symptoms are worsening. Diabetic complications include peripheral neuropathy. Pertinent negatives for diabetic complications include no CVA or heart disease. Risk factors for coronary artery disease include diabetes mellitus, dyslipidemia, hypertension, male sex, obesity and sedentary lifestyle. Current diabetic treatment includes insulin injections and oral agent (dual therapy). He is compliant with treatment all of the time. He is following a generally unhealthy diet. His breakfast blood glucose range is generally 130-140 mg/dl. Eye exam is current.  Gastrophageal Reflux He reports no abdominal pain, no coughing, no heartburn, no hoarse voice or no sore throat. This is a chronic problem. The current episode started more than 1 year ago. The problem occurs rarely. The problem has been resolved. The symptoms are aggravated by certain foods. He has tried a PPI for the symptoms. The treatment provided significant relief.  Anxiety Presents for follow-up visit. Symptoms include depressed mood. Patient reports no confusion, dizziness, excessive worry, insomnia, irritability, nervous/anxious behavior, palpitations, panic or restlessness. Symptoms occur occasionally. The severity of symptoms is moderate.   His past medical history is significant for anxiety/panic attacks, bipolar disorder and depression. Past treatments include SSRIs. Compliance with prior treatments has been good.  Benign Prostatic Hypertrophy This is a chronic problem. The  current episode started more than 1 year ago. The problem has been resolved since onset. Irritative symptoms do not include frequency, nocturia or urgency. Obstructive symptoms do not include straining. Pertinent negatives include no hesitancy. Past treatments include finasteride. The treatment provided significant relief.  Peripheral Neuropathy Pt currently taking lyrica. States he still has a lot "numbness" still, but with the lyrica its bearable.    *Pt has hx of seizures and see's Nani Skillern NP at [facility] who manges that. Pt currently taking lamictal and keepra with no complaints.  Pt also has hx of DVTs and PE. PT currently taking warfarin. INR today is 2.5.     Review of Systems  Constitutional: Negative.  Negative for irritability.  HENT: Negative.  Negative for hoarse voice and sore throat.   Eyes: Negative for blurred vision.  Respiratory: Negative.  Negative for cough.   Cardiovascular: Negative.  Negative for palpitations.  Gastrointestinal: Negative.  Negative for heartburn and abdominal pain.  Endocrine: Negative.   Genitourinary: Negative.  Negative for hesitancy, urgency, frequency and nocturia.  Musculoskeletal: Negative.   Neurological: Negative.  Negative for dizziness.  Hematological: Negative.   Psychiatric/Behavioral: Negative.  Negative for confusion. The patient is not nervous/anxious and does not have insomnia.   All other systems reviewed and are negative.      Objective:   Physical Exam  Vitals reviewed. Constitutional: He is oriented to person, place, and time. He appears well-developed and well-nourished. No distress.  HENT:  Head: Normocephalic.  Right Ear: External ear normal.  Left Ear: External ear normal.  Mouth/Throat: Oropharynx is clear and moist.  Eyes: Pupils are equal, round, and reactive to light. Right eye exhibits no discharge. Left eye exhibits no discharge.  Neck: Normal range of motion. Neck supple. No thyromegaly present.    Cardiovascular: Normal rate, regular rhythm, normal heart sounds and  intact distal pulses.   No murmur heard. Pulmonary/Chest: Effort normal and breath sounds normal. No respiratory distress. He has no wheezes.  Abdominal: Soft. Bowel sounds are normal. He exhibits no distension. There is no tenderness.  Musculoskeletal: Normal range of motion. He exhibits edema (trace amt in bilateral legs). He exhibits no tenderness.  Neurological: He is alert and oriented to person, place, and time. He has normal reflexes. No cranial nerve deficit.  Skin: Skin is warm and dry. No rash noted. No erythema.  Psychiatric: He has a normal mood and affect. His behavior is normal. Judgment and thought content normal.    BP 109/70  Pulse 61  Temp(Src) 98.5 F (36.9 C) (Oral)       Assessment & Plan:  1. DVT (deep venous thrombosis), unspecified laterality - POCT INR  2. Type 2 diabetes mellitus with hyperglycemia - POCT glycosylated hemoglobin (Hb A1C) - CMP14+EGFR  3. Seizure disorder - CMP14+EGFR  4. Morbid obesity  5. GAD (generalized anxiety disorder)  6. Gastroesophageal reflux disease with esophagitis - CMP14+EGFR  7. BPH (benign prostatic hyperplasia)  8. Chronic pain syndrome - fentaNYL (DURAGESIC) 75 MCG/HR; Place 1 patch (75 mcg total) onto the skin every 3 (three) days.  Dispense: 10 patch; Refill: 0 - HYDROcodone-acetaminophen (NORCO) 7.5-325 MG per tablet; Take 1 tablet by mouth daily. As needed for BTP (1 tablet per day)  Dispense: 30 tablet; Refill: 0  9. Encounter for vitamin deficiency screening - Vit D  25 hydroxy (rtn osteoporosis monitoring)  10. Hyperlipidemia - Lipid panel  11. Testosterone insufficiency - Testosterone,Free and Total   Continue all meds Labs pending Health Maintenance reviewed Diet and exercise encouraged RTO 3 months  Evelina Dun, FNP

## 2014-01-25 LAB — CMP14+EGFR
ALT: 20 IU/L (ref 0–44)
AST: 24 IU/L (ref 0–40)
Albumin/Globulin Ratio: 1.2 (ref 1.1–2.5)
Albumin: 3.8 g/dL (ref 3.5–5.5)
Alkaline Phosphatase: 92 IU/L (ref 39–117)
BUN/Creatinine Ratio: 18 (ref 9–20)
BUN: 30 mg/dL — ABNORMAL HIGH (ref 6–24)
CO2: 26 mmol/L (ref 18–29)
Calcium: 8.6 mg/dL — ABNORMAL LOW (ref 8.7–10.2)
Chloride: 94 mmol/L — ABNORMAL LOW (ref 97–108)
Creatinine, Ser: 1.67 mg/dL — ABNORMAL HIGH (ref 0.76–1.27)
GFR calc Af Amer: 55 mL/min/{1.73_m2} — ABNORMAL LOW (ref >59)
GFR calc non Af Amer: 48 mL/min/{1.73_m2} — ABNORMAL LOW (ref >59)
Globulin, Total: 3.3 g/dL (ref 1.5–4.5)
Glucose: 115 mg/dL — ABNORMAL HIGH (ref 65–99)
Potassium: 4.3 mmol/L (ref 3.5–5.2)
Sodium: 142 mmol/L (ref 134–144)
Total Bilirubin: 0.6 mg/dL (ref 0.0–1.2)
Total Protein: 7.1 g/dL (ref 6.0–8.5)

## 2014-01-25 LAB — LIPID PANEL
Chol/HDL Ratio: 3.7 ratio units (ref 0.0–5.0)
Cholesterol, Total: 130 mg/dL (ref 100–199)
HDL: 35 mg/dL — ABNORMAL LOW (ref >39)
LDL Calculated: 64 mg/dL (ref 0–99)
Triglycerides: 154 mg/dL — ABNORMAL HIGH (ref 0–149)
VLDL Cholesterol Cal: 31 mg/dL (ref 5–40)

## 2014-01-25 LAB — TESTOSTERONE,FREE AND TOTAL
Testosterone, Free: 3.5 pg/mL — ABNORMAL LOW (ref 6.8–21.5)
Testosterone: 172 ng/dL — ABNORMAL LOW (ref 348–1197)

## 2014-01-25 LAB — VITAMIN D 25 HYDROXY (VIT D DEFICIENCY, FRACTURES): Vit D, 25-Hydroxy: 22.3 ng/mL — ABNORMAL LOW (ref 30.0–100.0)

## 2014-01-26 ENCOUNTER — Other Ambulatory Visit: Payer: Self-pay | Admitting: Family

## 2014-01-26 MED ORDER — VITAMIN D (ERGOCALCIFEROL) 1.25 MG (50000 UNIT) PO CAPS: 50000.0000 [IU] | ORAL_CAPSULE | ORAL | Status: DC

## 2014-01-29 ENCOUNTER — Telehealth: Payer: Self-pay | Admitting: Family

## 2014-01-29 NOTE — Telephone Encounter (Signed)
Message copied by Cline Crock on Mon Jan 29, 2014  3:02 PM ------      Message from: [place], [place] A      Created: Fri Jan 26, 2014 10:50 AM       Hgb A1C improved since last draw- Still not at goal      Kidney and liver function stable-compared to past lab values      Triglycerides elevated- Pt needs to be on low fat diet      Vit D levels low- RX sent to pharmacy      Testosterone levels low- Continue testosterone replacement       ------

## 2014-01-31 ENCOUNTER — Encounter: Payer: Self-pay | Admitting: Family Medicine

## 2014-02-21 ENCOUNTER — Telehealth: Payer: Self-pay | Admitting: Family Medicine

## 2014-02-21 DIAGNOSIS — G894 Chronic pain syndrome: Secondary | ICD-10-CM

## 2014-02-22 MED ORDER — HYDROCODONE-ACETAMINOPHEN 7.5-325 MG PO TABS: 1.0000 | ORAL_TABLET | Freq: Every day | ORAL | Status: DC

## 2014-02-22 MED ORDER — FENTANYL 75 MCG/HR TD PT72: 75.0000 ug | MEDICATED_PATCH | TRANSDERMAL | Status: DC

## 2014-02-22 NOTE — Telephone Encounter (Signed)
Rx's printed and signed by PCP Evelina Dun. Patient's wife [name] notified that Rx's are at front desk to pick up

## 2014-03-07 ENCOUNTER — Ambulatory Visit (INDEPENDENT_AMBULATORY_CARE_PROVIDER_SITE_OTHER): Payer: Medicare Other | Admitting: Pharmacist

## 2014-03-07 DIAGNOSIS — G894 Chronic pain syndrome: Secondary | ICD-10-CM

## 2014-03-07 DIAGNOSIS — J810 Acute pulmonary edema: Secondary | ICD-10-CM

## 2014-03-07 DIAGNOSIS — I82409 Acute embolism and thrombosis of unspecified deep veins of unspecified lower extremity: Secondary | ICD-10-CM

## 2014-03-07 LAB — POCT INR: INR: 3.2

## 2014-03-07 MED ORDER — HYDROCODONE-ACETAMINOPHEN 7.5-325 MG PO TABS: 1.0000 | ORAL_TABLET | Freq: Every day | ORAL | Status: DC

## 2014-03-07 MED ORDER — FENTANYL 75 MCG/HR TD PT72: 75.0000 ug | MEDICATED_PATCH | TRANSDERMAL | Status: DC

## 2014-03-07 NOTE — Patient Instructions (Signed)
Anticoagulation Dose Instructions as of 03/07/2014     [name] Tue Wed Thu Fri Sat   New Dose 5 mg 5 mg 7.5 mg 5 mg 5 mg 5 mg 7.5 mg    Description        No warfarin today - Wednesday, September 9th, then continue warfarin 1 and 1/2 tablets on tuesdays and saturdays and 1 tablet all other days.     INR was 3.2 today

## 2014-03-21 ENCOUNTER — Encounter: Payer: Self-pay | Admitting: Neurology

## 2014-03-21 ENCOUNTER — Ambulatory Visit (INDEPENDENT_AMBULATORY_CARE_PROVIDER_SITE_OTHER): Payer: Medicare Other | Admitting: Neurology

## 2014-03-21 VITALS — BP 120/84 | HR 62 | Ht 75.9 in | Wt >= 6400 oz

## 2014-03-21 DIAGNOSIS — R51 Headache: Secondary | ICD-10-CM | POA: Insufficient documentation

## 2014-03-21 DIAGNOSIS — R519 Headache, unspecified: Secondary | ICD-10-CM | POA: Insufficient documentation
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
## 2014-03-29 ENCOUNTER — Telehealth: Payer: Self-pay | Admitting: Neurology

## 2014-03-29 NOTE — Telephone Encounter (Signed)
Patient checking status of Lumbar Puncture.  Please call anytime and may leave detailed message on voicemail.

## 2014-03-30 ENCOUNTER — Telehealth: Payer: Self-pay | Admitting: Family Medicine

## 2014-04-02 NOTE — Telephone Encounter (Signed)
Faxed form today

## 2014-04-03 ENCOUNTER — Other Ambulatory Visit: Payer: Self-pay | Admitting: Neurology

## 2014-04-03 DIAGNOSIS — G4459 Other complicated headache syndrome: Secondary | ICD-10-CM

## 2014-04-03 NOTE — Telephone Encounter (Signed)
Done

## 2014-04-03 NOTE — Telephone Encounter (Signed)
Casandra - I put in a referral for Lumbar Puncture. Can you get it over to [facility] today if possible? I also added another consult for opthamology if you could get that taken care of. After that if you could call the patient back and let him know where the referrals were faxed so he could follow up with appointment. Thank you!!

## 2014-04-03 NOTE — Telephone Encounter (Signed)
Called patient to assure message received. Will fwd to MD to order if necessary.

## 2014-04-04 ENCOUNTER — Other Ambulatory Visit: Payer: Self-pay | Admitting: Neurology

## 2014-04-04 ENCOUNTER — Telehealth: Payer: Self-pay | Admitting: *Deleted

## 2014-04-04 DIAGNOSIS — G932 Benign intracranial hypertension: Secondary | ICD-10-CM

## 2014-04-04 NOTE — Telephone Encounter (Signed)
Pt is over 400 lbs, not able to have done at [facility] (Lumbar Puncture).  Will need other site.  (hospital)?

## 2014-04-04 NOTE — Telephone Encounter (Signed)
Thank you :)

## 2014-04-04 NOTE — Telephone Encounter (Signed)
Called [facility]. They do not perform LP's.  Called hospital  [facility]) their table only holds up to 396lbs. [place] they will be able to accommodate patient. Spoke to scheduling at [facility]. Spoke to patient gave appt info: 04/10/14 @ [facility], arrival time 10:15. Enter 1st floor and proceed to radiology dept. Patient agreed.

## 2014-04-10 ENCOUNTER — Ambulatory Visit (HOSPITAL_COMMUNITY): Payer: PRIVATE HEALTH INSURANCE

## 2014-04-12 ENCOUNTER — Telehealth: Payer: Self-pay | Admitting: Neurology

## 2014-04-12 NOTE — Telephone Encounter (Signed)
Michaele Offer from [facility] calling to get lab orders for a patient since they are being seen there tomorrow, requesting that the orders be sent today, please return call and advise. The orders can be faxed to [phone].

## 2014-04-13 ENCOUNTER — Ambulatory Visit (HOSPITAL_COMMUNITY): Admission: RE | Admit: 2014-04-13 | Payer: PRIVATE HEALTH INSURANCE | Source: Ambulatory Visit

## 2014-04-13 ENCOUNTER — Other Ambulatory Visit: Payer: Self-pay | Admitting: Neurology

## 2014-04-13 ENCOUNTER — Telehealth: Payer: Self-pay | Admitting: Pharmacist

## 2014-04-13 ENCOUNTER — Ambulatory Visit (HOSPITAL_COMMUNITY): Payer: PRIVATE HEALTH INSURANCE

## 2014-04-13 DIAGNOSIS — G44221 Chronic tension-type headache, intractable: Secondary | ICD-10-CM

## 2014-04-13 DIAGNOSIS — Z7901 Long term (current) use of anticoagulants: Secondary | ICD-10-CM

## 2014-04-13 NOTE — Telephone Encounter (Signed)
Fwd to Dr. Jaynee Eagles

## 2014-04-13 NOTE — Telephone Encounter (Signed)
I placed orders for him. Would you call [facility] and see when they want him to have the tests, the day before his LP possibly? Also apologize to patient about the mixup. Thank you!!

## 2014-04-13 NOTE — Telephone Encounter (Signed)
[name] with [name] called back and stated patient's Lumbar Puncture has been rescheduled to 10/21.  Patient wasn't informed to discontinue warfarin (COUMADIN) 5 MG tablet 4 days prior to Lumbar Puncture.  [name]'s requesting lab orders for INR, PT, & TTT.    Please call and advise.

## 2014-04-13 NOTE — Telephone Encounter (Signed)
Deidra with [name] @ [phone], requesting lab orders for Lumbar Puncture scheduled this am.  Radiologist also noticed patient hadn't had CT Head since Feb.  Also requesting order for CT Head.  Please fax to [phone].

## 2014-04-16 ENCOUNTER — Ambulatory Visit (INDEPENDENT_AMBULATORY_CARE_PROVIDER_SITE_OTHER): Payer: Medicare Other | Admitting: Pharmacist

## 2014-04-16 ENCOUNTER — Other Ambulatory Visit: Payer: Self-pay | Admitting: Neurology

## 2014-04-16 DIAGNOSIS — J810 Acute pulmonary edema: Secondary | ICD-10-CM

## 2014-04-16 DIAGNOSIS — G894 Chronic pain syndrome: Secondary | ICD-10-CM

## 2014-04-16 DIAGNOSIS — I82409 Acute embolism and thrombosis of unspecified deep veins of unspecified lower extremity: Secondary | ICD-10-CM

## 2014-04-16 DIAGNOSIS — G932 Benign intracranial hypertension: Secondary | ICD-10-CM

## 2014-04-16 LAB — POCT INR: INR: 1.4

## 2014-04-16 MED ORDER — HYDROCODONE-ACETAMINOPHEN 7.5-325 MG PO TABS: 1.0000 | ORAL_TABLET | Freq: Every day | ORAL | Status: DC

## 2014-04-16 MED ORDER — FENTANYL 75 MCG/HR TD PT72: 75.0000 ug | MEDICATED_PATCH | TRANSDERMAL | Status: DC

## 2014-04-16 NOTE — Telephone Encounter (Signed)
Deidra with [name] @ [phone], stated she need an order for labs needed from fluid from Lumbar Puncture.

## 2014-04-16 NOTE — Patient Instructions (Signed)
Anticoagulation Dose Instructions as of 04/16/2014     [name] Tue Wed Thu Fri Sat   New Dose 5 mg 5 mg 7.5 mg 5 mg 5 mg 5 mg 7.5 mg    Description       Continue to hold warfarin prior to spinal tap.  Recommend restart at 10mg  for 2 days then regular dose after spinal tap.        INR was 1.4 today

## 2014-04-16 NOTE — Progress Notes (Signed)
Subjective:    [name] is a 48 y.o. male who presents for follow up chronic pain and recheck INR   Patient is currently using fentanyl patches 59mcg/hr and changes every 3 days and hydrocodone/APAP as needed for BTP daily Reports pain 6/10 most days but lowest 4/10 and highest 9/10.  Increased in migraine HA's.   He is scheduled for spinal tap Wednesday, October 22nd. No daytime drowsiness like last visit. Denies constipation.     Current Medications (verified) Current Outpatient Prescriptions  Medication Sig Dispense Refill  . busPIRone (BUSPAR) 15 MG tablet Take 1 tablet (15 mg total) by mouth 2 (two) times daily.  180 tablet  3  . Canagliflozin (INVOKANA) 100 MG TABS Take 1 tablet (100 mg total) by mouth daily.  90 tablet  3  . citalopram (CELEXA) 20 MG tablet Take 3 tablets at bedtime (60 mg total)  270 tablet  1  . cyclobenzaprine (FLEXERIL) 10 MG tablet Take 1/2 to 1 tablet by mouth as needed at bedtime for muscle cramps / spasms  30 tablet  3  . EPINEPHrine (EPIPEN 2-PAK) 0.3 mg/0.3 mL SOAJ injection Inject 0.3 mLs (0.3 mg total) into the muscle once. As needed for anaphylactic reaction  2 Device  0  . esomeprazole (NEXIUM) 40 MG capsule Take 1 capsule (40 mg total) by mouth daily before breakfast.  90 capsule  2  . fentaNYL (DURAGESIC) 75 MCG/HR Place 1 patch (75 mcg total) onto the skin every 3 (three) days.  10 patch  0  . fluticasone (FLONASE) 50 MCG/ACT nasal spray Place 1 spray into both nostrils 2 (two) times daily.  48 g  3  . furosemide (LASIX) 80 MG tablet Take 1 tablet (80 mg total) by mouth 2 (two) times daily.  180 tablet  3  . glucose blood (ACCU-CHEK COMPACT PLUS) test strip Use to check BG up to 4 times daily.  Dx: 250.02 type 2 DM, not at goals  400 each  0  . HYDROcodone-acetaminophen (NORCO) 7.5-325 MG per tablet Take 1 tablet by mouth daily. As needed for BTP (1 tablet per day)  30 tablet  0  . insulin aspart (NOVOLOG FLEXPEN) 100 UNIT/ML FlexPen Inject  35 Units into the skin 3 (three) times daily with meals.  105 mL  3  . Insulin Glargine (LANTUS SOLOSTAR) 100 UNIT/ML Solostar Pen Inject 50 Units into the skin 2 (two) times daily.  165 mL  1  . Insulin Pen Needle (B-D ULTRAFINE III SHORT PEN) 31G X 8 MM MISC Inject 1 pen into the skin 5 (five) times daily.  500 each  3  . Insulin Syringes, Disposable, U-100 1 ML MISC Use to inject insulin as directed up to qid  Dx 250.02 - adult onset DM now requiring multiple daily injections for control  200 each  2  . lamoTRIgine (LAMICTAL) 200 MG tablet Take 1 tablet (200 mg total) by mouth 2 (two) times daily.  180 tablet  3  . levETIRAcetam (KEPPRA) 750 MG tablet Take 1,000 mg by mouth 2 (two) times daily.       . methocarbamol (ROBAXIN) 750 MG tablet Take 1 tablet (750 mg total) by mouth 2 (two) times daily as needed.  180 tablet  3  . Multiple Vitamin (MULTIVITAMIN WITH MINERALS) TABS Take 1 tablet by mouth daily.      . potassium chloride (K-DUR,KLOR-CON) 10 MEQ tablet Take 30 mEq by mouth daily.      . pregabalin (LYRICA) 50  MG capsule Take 1 capsule (50 mg total) by mouth 3 (three) times daily.  270 capsule  3  . rizatriptan (MAXALT) 10 MG tablet Take 20 mg by mouth once a week.       . simvastatin (ZOCOR) 20 MG tablet Take 1 tablet (20 mg total) by mouth at bedtime.  90 tablet  3  . tamsulosin (FLOMAX) 0.4 MG CAPS capsule Take 1 capsule (0.4 mg total) by mouth daily.  90 capsule  3  . testosterone (ANDROGEL) 50 MG/5GM (1%) GEL Place 5 g onto the skin daily. Apply to shoulder daily  90 Tube  1  . Vitamin D, Ergocalciferol, (DRISDOL) 50000 UNITS CAPS capsule Take 1 capsule (50,000 Units total) by mouth every 7 (seven) days.  30 capsule  6  . warfarin (COUMADIN) 5 MG tablet Take 5 mg by mouth daily. Take 1 to 1.5 tab every day except thursdays take 1.5 tabs Total 5-7.5      . zolpidem (AMBIEN) 5 MG tablet Take 0.5 tablets (2.5 mg total) by mouth at bedtime as needed. For sleep  30 tablet  3  .  [DISCONTINUED] potassium chloride (K-DUR) 10 MEQ tablet Take 3 tablets (30 mEq total) by mouth daily.  270 tablet  0   No current facility-administered medications for this visit.     Allergies (verified) Bee venom; Penicillins; Iohexol; Shellfish allergy; Iodine; and Latex    Objective:      There were no vitals taken for this visit. There is no weight on file to calculate BMI.  No weight due to limitations of scales.  Therefore no BMI  INR was 1.4 today     Assessment:     Chronic pain - controlled Subtherapeutic Anticoagulation due to holding warfarin prior to spinal tap in 2 days.     Plan:    Rx for pain medications - fentanyl and hydrocodone/APAP given today - signed by Dr Redge Gainer since patient's regular PCP not in office today.   Anticoagulation Dose Instructions as of 04/16/2014     Dorene Grebe Tue Wed Thu Fri Sat   New Dose 5 mg 5 mg 7.5 mg 5 mg 5 mg 5 mg 7.5 mg    Description       Continue to hold warfarin prior to spinal tap.  Recommend restart at 10mg  for 2 days then regular dose after spinal tap.       RTC in 2 weeks - Teodoro Spray, MD and 6 [place], PharmD, CPP

## 2014-04-16 NOTE — Telephone Encounter (Signed)
Spoke to [employer]. Will speak with Dr. Jaynee Eagles about what lab orders needed for LP.

## 2014-04-16 NOTE — Telephone Encounter (Signed)
Discussed at today's appt.

## 2014-04-18 ENCOUNTER — Ambulatory Visit (HOSPITAL_COMMUNITY)
Admission: RE | Admit: 2014-04-18 | Discharge: 2014-04-18 | Disposition: A | Discharge status: Home / Self Care | Payer: PRIVATE HEALTH INSURANCE | Source: Ambulatory Visit | Attending: Neurology | Admitting: Neurology

## 2014-04-18 DIAGNOSIS — R51 Headache: Secondary | ICD-10-CM | POA: Insufficient documentation

## 2014-04-18 DIAGNOSIS — G932 Benign intracranial hypertension: Secondary | ICD-10-CM | POA: Diagnosis not present

## 2014-04-18 DIAGNOSIS — G44221 Chronic tension-type headache, intractable: Secondary | ICD-10-CM

## 2014-04-18 LAB — CSF CELL COUNT WITH DIFFERENTIAL
RBC Count, CSF: 0 /mm3
Tube #: 4
WBC, CSF: 1 /mm3 (ref 0–5)

## 2014-04-18 LAB — PROTEIN, CSF: Total  Protein, CSF: 28 mg/dL (ref 15–45)

## 2014-04-18 LAB — GLUCOSE, CSF: Glucose, CSF: 128 mg/dL — ABNORMAL HIGH (ref 43–76)

## 2014-04-18 MED ORDER — ACETAMINOPHEN 500 MG PO TABS
1000.0000 mg | ORAL_TABLET | Freq: Four times a day (QID) | ORAL | Status: DC | PRN
  Filled 2014-04-18: qty 2

## 2014-04-18 NOTE — CV Procedure (Signed)
LP performed in Radiology at [facility].  Clear CSF, no complications. For 3 hour observation

## 2014-04-18 NOTE — Discharge Instructions (Signed)
Lumbar Puncture, Care After °Refer to this sheet in the next few weeks. These instructions provide you with information on caring for yourself after your procedure. Your health care provider may also give you more specific instructions. Your treatment has been planned according to current medical practices, but problems sometimes occur. Call your health care provider if you have any problems or questions after your procedure. °WHAT TO EXPECT AFTER THE PROCEDURE °After your procedure, it is typical to have the following sensations: °· Mild discomfort or pain at the insertion site. °· Mild headache that is relieved with pain medicines. °HOME CARE INSTRUCTIONS °· Avoid lifting anything heavier than 10 lb (4.5 kg) for at least 12 hours after the procedure. °· Drink enough fluids to keep your urine clear or pale yellow. °SEEK MEDICAL CARE IF: °· You have fever or chills. °· You have nausea or vomiting. °· You have a headache that lasts for more than 2 days. °SEEK IMMEDIATE MEDICAL CARE IF: °· You have any numbness or tingling in your legs. °· You are unable to control your bowel or bladder. °· You have bleeding or swelling in your back at the insertion site. °· You are dizzy or faint. °Document Released: 06/20/2013 Document Reviewed: 06/20/2013 °[employer]® Patient Information ©2015 [employer]. This information is not intended to replace advice given to you by your health care provider. Make sure you discuss any questions you have with your health care provider. ° °

## 2014-04-19 ENCOUNTER — Telehealth: Payer: Self-pay | Admitting: Neurology

## 2014-04-19 ENCOUNTER — Other Ambulatory Visit: Payer: Self-pay | Admitting: Neurology

## 2014-04-19 DIAGNOSIS — H4710 Unspecified papilledema: Secondary | ICD-10-CM

## 2014-04-19 DIAGNOSIS — R519 Headache, unspecified: Secondary | ICD-10-CM

## 2014-04-19 DIAGNOSIS — R51 Headache: Principal | ICD-10-CM

## 2014-04-19 NOTE — Telephone Encounter (Signed)
Patient was referred to me for evaluation of pseudotumor cerebri. Discussed lumbar puncture and CT results with patient. His pressure was 21 which is normal considering his extremely large body habitus. Spoke to him at length and explained the results. He reports worsening headaches. He has follow up with Ophthamology. I will order an MRI of his brain as well as MRV.  He is on multiple medications including fentanyl, flexeril, Norco, lamictal, keppra, lyrica so will hold off on anything new until after workup complete.

## 2014-04-27 ENCOUNTER — Ambulatory Visit (INDEPENDENT_AMBULATORY_CARE_PROVIDER_SITE_OTHER): Payer: Medicare Other | Admitting: Family Medicine

## 2014-04-27 ENCOUNTER — Other Ambulatory Visit: Payer: Self-pay | Admitting: *Deleted

## 2014-04-27 ENCOUNTER — Encounter: Payer: Self-pay | Admitting: Family Medicine

## 2014-04-27 VITALS — Temp 98.9°F

## 2014-04-27 DIAGNOSIS — F32A Depression, unspecified: Secondary | ICD-10-CM

## 2014-04-27 DIAGNOSIS — F329 Major depressive disorder, single episode, unspecified: Secondary | ICD-10-CM

## 2014-04-27 DIAGNOSIS — M62838 Other muscle spasm: Secondary | ICD-10-CM

## 2014-04-27 DIAGNOSIS — I82409 Acute embolism and thrombosis of unspecified deep veins of unspecified lower extremity: Secondary | ICD-10-CM

## 2014-04-27 DIAGNOSIS — G894 Chronic pain syndrome: Secondary | ICD-10-CM

## 2014-04-27 DIAGNOSIS — E291 Testicular hypofunction: Secondary | ICD-10-CM

## 2014-04-27 DIAGNOSIS — J810 Acute pulmonary edema: Secondary | ICD-10-CM

## 2014-04-27 DIAGNOSIS — N400 Enlarged prostate without lower urinary tract symptoms: Secondary | ICD-10-CM

## 2014-04-27 DIAGNOSIS — E785 Hyperlipidemia, unspecified: Secondary | ICD-10-CM

## 2014-04-27 DIAGNOSIS — G4700 Insomnia, unspecified: Secondary | ICD-10-CM

## 2014-04-27 DIAGNOSIS — E1121 Type 2 diabetes mellitus with diabetic nephropathy: Secondary | ICD-10-CM

## 2014-04-27 DIAGNOSIS — Z7901 Long term (current) use of anticoagulants: Secondary | ICD-10-CM

## 2014-04-27 LAB — POCT INR: INR: 1.8

## 2014-04-27 LAB — POCT GLYCOSYLATED HEMOGLOBIN (HGB A1C): Hemoglobin A1C: 7.5

## 2014-04-27 MED ORDER — VITAMIN D (ERGOCALCIFEROL) 1.25 MG (50000 UNIT) PO CAPS: 50000.0000 [IU] | ORAL_CAPSULE | ORAL | Status: DC

## 2014-04-27 MED ORDER — FLUTICASONE PROPIONATE 50 MCG/ACT NA SUSP: 1.0000 | Freq: Two times a day (BID) | NASAL | Status: DC

## 2014-04-27 MED ORDER — INSULIN PEN NEEDLE 31G X 8 MM MISC: 1.0000 "pen " | Freq: Every day | Status: DC

## 2014-04-27 MED ORDER — DIAZEPAM 5 MG PO TABS: 5.0000 mg | ORAL_TABLET | Freq: Two times a day (BID) | ORAL | Status: DC | PRN

## 2014-04-27 MED ORDER — BUSPIRONE HCL 15 MG PO TABS: 15.0000 mg | ORAL_TABLET | Freq: Two times a day (BID) | ORAL | Status: DC

## 2014-04-27 MED ORDER — WARFARIN SODIUM 5 MG PO TABS: 5.0000 mg | ORAL_TABLET | Freq: Every day | ORAL | Status: DC

## 2014-04-27 MED ORDER — INSULIN SYRINGES (DISPOSABLE) U-100 1 ML MISC: Status: DC

## 2014-04-27 MED ORDER — METHOCARBAMOL 750 MG PO TABS: 750.0000 mg | ORAL_TABLET | Freq: Two times a day (BID) | ORAL | Status: DC | PRN

## 2014-04-27 MED ORDER — TAMSULOSIN HCL 0.4 MG PO CAPS: 0.4000 mg | ORAL_CAPSULE | Freq: Every day | ORAL | Status: DC

## 2014-04-27 MED ORDER — INSULIN ASPART 100 UNIT/ML FLEXPEN: 35.0000 [IU] | PEN_INJECTOR | Freq: Three times a day (TID) | SUBCUTANEOUS | Status: DC

## 2014-04-27 MED ORDER — CANAGLIFLOZIN 100 MG PO TABS: 100.0000 mg | ORAL_TABLET | Freq: Every day | ORAL | Status: DC

## 2014-04-27 MED ORDER — SIMVASTATIN 20 MG PO TABS: 20.0000 mg | ORAL_TABLET | Freq: Every day | ORAL | Status: DC

## 2014-04-27 MED ORDER — ZOLPIDEM TARTRATE 5 MG PO TABS: 2.5000 mg | ORAL_TABLET | Freq: Every evening | ORAL | Status: DC | PRN

## 2014-04-27 MED ORDER — TESTOSTERONE 50 MG/5GM (1%) TD GEL: 5.0000 g | Freq: Every day | TRANSDERMAL | Status: DC

## 2014-04-27 MED ORDER — PREGABALIN 50 MG PO CAPS: 50.0000 mg | ORAL_CAPSULE | Freq: Three times a day (TID) | ORAL | Status: DC

## 2014-04-27 MED ORDER — FENTANYL 75 MCG/HR TD PT72: 75.0000 ug | MEDICATED_PATCH | TRANSDERMAL | Status: DC

## 2014-04-27 MED ORDER — FUROSEMIDE 80 MG PO TABS: 80.0000 mg | ORAL_TABLET | Freq: Two times a day (BID) | ORAL | Status: DC

## 2014-04-27 MED ORDER — LAMOTRIGINE 200 MG PO TABS: 200.0000 mg | ORAL_TABLET | Freq: Two times a day (BID) | ORAL | Status: DC

## 2014-04-27 MED ORDER — RIZATRIPTAN BENZOATE 10 MG PO TABS: 20.0000 mg | ORAL_TABLET | ORAL | Status: DC

## 2014-04-27 MED ORDER — POTASSIUM CHLORIDE CRYS ER 10 MEQ PO TBCR: 30.0000 meq | EXTENDED_RELEASE_TABLET | Freq: Every day | ORAL | Status: DC

## 2014-04-27 MED ORDER — INSULIN GLARGINE 100 UNIT/ML SOLOSTAR PEN: 50.0000 [IU] | PEN_INJECTOR | Freq: Two times a day (BID) | SUBCUTANEOUS | Status: DC

## 2014-04-27 MED ORDER — GLUCOSE BLOOD VI STRP: ORAL_STRIP | Status: DC

## 2014-04-27 MED ORDER — CITALOPRAM HYDROBROMIDE 20 MG PO TABS: ORAL_TABLET | ORAL | Status: DC

## 2014-04-27 MED ORDER — CYCLOBENZAPRINE HCL 10 MG PO TABS: ORAL_TABLET | ORAL | Status: DC

## 2014-04-27 MED ORDER — SILDENAFIL CITRATE 20 MG PO TABS: ORAL_TABLET | ORAL | Status: DC

## 2014-04-27 MED ORDER — ESOMEPRAZOLE MAGNESIUM 40 MG PO CPDR: 40.0000 mg | DELAYED_RELEASE_CAPSULE | Freq: Every day | ORAL | Status: DC

## 2014-04-27 MED ORDER — LEVETIRACETAM 750 MG PO TABS: 1000.0000 mg | ORAL_TABLET | Freq: Two times a day (BID) | ORAL | Status: DC

## 2014-04-27 NOTE — Progress Notes (Signed)
   Subjective:    Patient ID: [name], male    DOB: 02-27-1966, 48 y.o.   MRN: [id number]  HPI 48 year old gentleman here to follow-up diabetes, morbid obesity, chronic pain syndrome, low testosterone, pickwickian syndrome, DVT and pulmonary embolus We spent some time discussing his medications. Generally he is in a good place as far as his many chronic problems. Pain is controlled with fentanyl patch and when necessary hydrocodone. He is concerned about his testosterone level has erectile dysfunction. There are also concerns stemming from coverage by insurance around his short acting insulin. In addition, insurance insisted they will not cover his muscle relaxant, methocarbamol, and I have suggested that we use Valium as a muscle relaxant and have explained this. I believe the patient and his wife have some insight as to his chronic issues and this greatly aids his care.    Review of Systems  Eyes: Negative.   Respiratory: Positive for shortness of breath.   Cardiovascular: Negative.   Gastrointestinal: Negative.   Genitourinary: Positive for difficulty urinating.  Neurological: Positive for seizures, weakness and headaches.  Psychiatric/Behavioral: Negative.        Objective:   Physical Exam  Constitutional: He is oriented to person, place, and time.  Morbidly obese  Cardiovascular: Normal rate and regular rhythm.   Pulmonary/Chest: Effort normal and breath sounds normal.  Musculoskeletal:  He is seated in a motorized scooter. He is able to walk short distances but becomes weak and short of breath due to his obesity  Neurological: He is alert and oriented to person, place, and time.  Psychiatric: He has a normal mood and affect.   Temp(Src) 98.9 F (37.2 C) (Oral)       Assessment & Plan:  1. Depression  - citalopram (CELEXA) 20 MG tablet; Take 3 tablets at bedtime (60 mg total)  Dispense: 270 tablet; Refill: 1  2. Chronic pain syndrome  - TSH  3. Type 2  diabetes mellitus with diabetic nephropathy  - canagliflozin (INVOKANA) 100 MG TABS tablet; Take 1 tablet (100 mg total) by mouth daily.  Dispense: 90 tablet; Refill: 3 - Insulin Glargine (LANTUS SOLOSTAR) 100 UNIT/ML Solostar Pen; Inject 50 Units into the skin 2 (two) times daily.  Dispense: 165 mL; Refill: 1 - POCT glycosylated hemoglobin (Hb A1C)  4. Muscle spasm Try Valium rather than methcarbamol  5. Hyperlipidemia   6. BPH (benign prostatic hyperplasia)  - PSA, total and free  7. Hypogonadism male  - Testosterone,Free and Total  8. Insomnia  - zolpidem (AMBIEN) 5 MG tablet; Take 0.5 tablets (2.5 mg total) by mouth at bedtime as needed. For sleep  Dispense: 30 tablet; Refill: 3  9. Chronic anticoagulation  - POCT INR  10. Postoperative pulmonary edema   11. DVT (deep venous thrombosis), unspecified laterality

## 2014-04-29 LAB — TESTOSTERONE,FREE AND TOTAL
Testosterone, Free: 3 pg/mL — ABNORMAL LOW (ref 6.8–21.5)
Testosterone: 171 ng/dL — ABNORMAL LOW (ref 348–1197)

## 2014-04-29 LAB — PSA, TOTAL AND FREE
PSA, Free Pct: >40 %
PSA, Free: 0.04 ng/mL
PSA: <0.1 ng/mL (ref 0.0–4.0)

## 2014-04-29 LAB — TSH: TSH: 2.5 u[IU]/mL (ref 0.450–4.500)

## 2014-05-01 ENCOUNTER — Other Ambulatory Visit: Payer: Self-pay | Admitting: Family Medicine

## 2014-05-01 ENCOUNTER — Telehealth: Payer: Self-pay | Admitting: Family Medicine

## 2014-05-02 NOTE — Telephone Encounter (Signed)
Yes and it was faxed back on 04/30/14

## 2014-05-04 ENCOUNTER — Telehealth: Payer: Self-pay | Admitting: Family Medicine

## 2014-05-04 DIAGNOSIS — E291 Testicular hypofunction: Secondary | ICD-10-CM

## 2014-05-07 NOTE — Telephone Encounter (Signed)
Need you to address the androgel, I have taken care of the Viagra. Pt is aware that this will be done on 05/09/14 when Dr Sabra Heck comes back

## 2014-05-11 MED ORDER — TESTOSTERONE 50 MG/5GM (1%) TD GEL: 5.0000 g | Freq: Every day | TRANSDERMAL | Status: DC

## 2014-05-11 MED ORDER — TESTOSTERONE 40.5 MG/2.5GM (1.62%) TD GEL: 40.5000 mg/d | Freq: Every day | TRANSDERMAL | Status: DC

## 2014-05-11 NOTE — Telephone Encounter (Signed)
I printed the androgel but they are wanting the dosage changed to 1.62%  If you change the dosage in epic i will reorder

## 2014-05-11 NOTE — Telephone Encounter (Signed)
Okay to refill both prescriptions: AndroGel 1.62% and Viagra, same dose

## 2014-05-16 ENCOUNTER — Ambulatory Visit
Admission: RE | Admit: 2014-05-16 | Discharge: 2014-05-16 | Disposition: A | Discharge status: Home / Self Care | Payer: PRIVATE HEALTH INSURANCE | Source: Ambulatory Visit | Attending: Neurology | Admitting: Neurology

## 2014-05-16 DIAGNOSIS — R51 Headache: Secondary | ICD-10-CM

## 2014-05-16 DIAGNOSIS — H4710 Unspecified papilledema: Secondary | ICD-10-CM

## 2014-05-16 DIAGNOSIS — R519 Headache, unspecified: Secondary | ICD-10-CM

## 2014-05-16 MED ORDER — GADOBENATE DIMEGLUMINE 529 MG/ML IV SOLN
10.0000 mL | Freq: Once | INTRAVENOUS | Status: AC | PRN
  Administered 2014-05-16: 10 mL via INTRAVENOUS

## 2014-05-23 ENCOUNTER — Telehealth: Payer: Self-pay | Admitting: Family Medicine

## 2014-05-23 ENCOUNTER — Other Ambulatory Visit: Payer: Self-pay | Admitting: *Deleted

## 2014-05-23 DIAGNOSIS — F32A Depression, unspecified: Secondary | ICD-10-CM

## 2014-05-23 DIAGNOSIS — F329 Major depressive disorder, single episode, unspecified: Secondary | ICD-10-CM

## 2014-05-23 MED ORDER — LEVETIRACETAM 500 MG PO TABS: 500.0000 mg | ORAL_TABLET | Freq: Two times a day (BID) | ORAL | Status: DC

## 2014-05-23 MED ORDER — POTASSIUM CHLORIDE CRYS ER 10 MEQ PO TBCR: 30.0000 meq | EXTENDED_RELEASE_TABLET | Freq: Every day | ORAL | Status: DC

## 2014-05-25 NOTE — Telephone Encounter (Signed)
Okay to refill prescriptions per patient request

## 2014-05-28 LAB — HM DIABETES EYE EXAM

## 2014-05-29 ENCOUNTER — Telehealth: Payer: Self-pay | Admitting: *Deleted

## 2014-05-29 NOTE — Telephone Encounter (Signed)
Per patient 's wife the seizure medication was increased to 1000  mg BID per his [facility] doctor.   She was informed that we can not change our records on the dose. She must have neuro send in new dose and we could do refills after that.

## 2014-05-29 NOTE — Telephone Encounter (Signed)
-----   Message from [name], MD sent at 05/21/2014  1:18 PM EST ----- Please let patient know that his MRI of the brain and MRV of the brain were unremarkable. Thank you.

## 2014-05-29 NOTE — Telephone Encounter (Signed)
Spoke to patient about normal results and he verbalizes understanding. Patient would like to know when is he suppose to follow up since the results are back. Please advise

## 2014-05-29 NOTE — Telephone Encounter (Signed)
Would you let patient know that he can follow up this month with me at his convenience. I need a 30 minute slot for this patient. Thank you.

## 2014-05-30 ENCOUNTER — Ambulatory Visit (INDEPENDENT_AMBULATORY_CARE_PROVIDER_SITE_OTHER): Payer: Medicare Other | Admitting: Pharmacist

## 2014-05-30 ENCOUNTER — Telehealth: Payer: Self-pay | Admitting: Neurology

## 2014-05-30 DIAGNOSIS — J810 Acute pulmonary edema: Secondary | ICD-10-CM

## 2014-05-30 DIAGNOSIS — I82409 Acute embolism and thrombosis of unspecified deep veins of unspecified lower extremity: Secondary | ICD-10-CM

## 2014-05-30 LAB — POCT INR: INR: 2.3

## 2014-05-30 NOTE — Telephone Encounter (Signed)
Called patient and scheduled /confirmed appointment per Dr Jaynee Eagles

## 2014-05-30 NOTE — Patient Instructions (Signed)
Anticoagulation Dose Instructions as of 05/30/2014      [name] Tue Wed Thu Fri Sat   New Dose 5 mg 5 mg 7.5 mg 5 mg 5 mg 5 mg 7.5 mg    Description        Continue usual warfarin dose of 1 and 1/2 tablets on tuesdays and saturdays and 1 tablet all other days.       INR was 2.3 today

## 2014-06-06 NOTE — Telephone Encounter (Signed)
done

## 2014-06-08 ENCOUNTER — Telehealth: Payer: Self-pay | Admitting: Family Medicine

## 2014-06-12 ENCOUNTER — Telehealth: Payer: Self-pay | Admitting: Family Medicine

## 2014-06-12 DIAGNOSIS — G894 Chronic pain syndrome: Secondary | ICD-10-CM

## 2014-06-13 ENCOUNTER — Encounter: Payer: Self-pay | Admitting: Neurology

## 2014-06-13 ENCOUNTER — Ambulatory Visit (INDEPENDENT_AMBULATORY_CARE_PROVIDER_SITE_OTHER): Payer: Medicare Other | Admitting: Neurology

## 2014-06-13 VITALS — BP 128/78 | HR 62

## 2014-06-13 DIAGNOSIS — G43009 Migraine without aura, not intractable, without status migrainosus: Secondary | ICD-10-CM

## 2014-06-13 DIAGNOSIS — R569 Unspecified convulsions: Secondary | ICD-10-CM

## 2014-06-13 MED ORDER — HYDROCODONE-ACETAMINOPHEN 7.5-325 MG PO TABS: 1.0000 | ORAL_TABLET | Freq: Every day | ORAL | Status: DC

## 2014-06-13 MED ORDER — TOPIRAMATE 25 MG PO TABS: 25.0000 mg | ORAL_TABLET | Freq: Two times a day (BID) | ORAL | Status: DC

## 2014-06-13 MED ORDER — FENTANYL 75 MCG/HR TD PT72: 75.0000 ug | MEDICATED_PATCH | TRANSDERMAL | Status: DC

## 2014-06-13 NOTE — Patient Instructions (Signed)
Overall you are doing fairly well but I do want to suggest a few things today:   Remember to drink plenty of fluid, eat healthy meals and do not skip any meals. Try to eat protein with a every meal and eat a healthy snack such as fruit or nuts in between meals. Try to keep a regular sleep-wake schedule and try to exercise daily, particularly in the form of walking, 20-30 minutes a day, if you can.   As far as your medications are concerned, I would like to suggest; Topamax 25mg  twice daily.   I would like to see you back in 3 months, sooner if we need to. Please call us with any interim questions, concerns, problems, updates or refill requests.   Please also call us for any test results so we can go over those with you on the phone.  My clinical assistant and will answer any of your questions and relay your messages to me and also relay most of my messages to you.   Our phone number is [phone]. We also have an after hours call service for urgent matters and there is a physician on-call for urgent questions. For any emergencies you know to call 911 or go to the nearest emergency room

## 2014-06-13 NOTE — Telephone Encounter (Signed)
rx's were printed today and given to Dr Sabra Heck to sign but will be tomorrow before he has chance to sign.  Called patient and has enough to last until early next week.
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
## 2014-06-14 ENCOUNTER — Telehealth: Payer: Self-pay | Admitting: *Deleted

## 2014-06-14 NOTE — Telephone Encounter (Signed)
I believe this can be substituted unit for unit and is just in a different delivery system

## 2014-06-14 NOTE — Telephone Encounter (Signed)
rx mailed and patient aware.

## 2014-06-14 NOTE — Telephone Encounter (Signed)
Dr Sabra Heck, [name] has been taking novolog flexpen and it is not on his formulary for 2016, however humalog Claiborne Rigg is, can that be substituted if so can you fix for 2016. Thanks

## 2014-06-19 ENCOUNTER — Telehealth: Payer: Self-pay | Admitting: Family Medicine

## 2014-06-19 NOTE — Telephone Encounter (Signed)
Form faxed to [employer]

## 2014-06-25 NOTE — Telephone Encounter (Signed)
Sharyn Lull can you take care of this for me, as I don't order meds and I am not sure if this is totally interchangable.  I appreciate it.

## 2014-06-26 ENCOUNTER — Other Ambulatory Visit: Payer: Self-pay | Admitting: Pharmacist Clinician (PhC)/ Clinical Pharmacy Specialist

## 2014-06-26 MED ORDER — INSULIN LISPRO 100 UNIT/ML (KWIKPEN): 35.0000 [IU] | PEN_INJECTOR | Freq: Three times a day (TID) | SUBCUTANEOUS | Status: DC

## 2014-07-02 ENCOUNTER — Other Ambulatory Visit: Payer: Self-pay | Admitting: *Deleted

## 2014-07-02 MED ORDER — DIAZEPAM 5 MG PO TABS: 5.0000 mg | ORAL_TABLET | Freq: Two times a day (BID) | ORAL | Status: DC | PRN

## 2014-07-02 NOTE — Telephone Encounter (Signed)
Last filled 05/28/14, last seen 04/27/14. If approved, have nurse call into [facility]

## 2014-07-02 NOTE — Telephone Encounter (Signed)
RF called to [facility]

## 2014-07-06 ENCOUNTER — Ambulatory Visit (INDEPENDENT_AMBULATORY_CARE_PROVIDER_SITE_OTHER): Payer: Medicare Other | Admitting: *Deleted

## 2014-07-06 ENCOUNTER — Ambulatory Visit (INDEPENDENT_AMBULATORY_CARE_PROVIDER_SITE_OTHER): Payer: Medicare Other | Admitting: Pharmacist

## 2014-07-06 DIAGNOSIS — I82409 Acute embolism and thrombosis of unspecified deep veins of unspecified lower extremity: Secondary | ICD-10-CM | POA: Diagnosis not present

## 2014-07-06 DIAGNOSIS — J810 Acute pulmonary edema: Secondary | ICD-10-CM | POA: Diagnosis not present

## 2014-07-06 DIAGNOSIS — Z23 Encounter for immunization: Secondary | ICD-10-CM | POA: Diagnosis not present

## 2014-07-06 DIAGNOSIS — G894 Chronic pain syndrome: Secondary | ICD-10-CM

## 2014-07-06 LAB — POCT INR: INR: 2.6

## 2014-07-06 MED ORDER — HYDROCODONE-ACETAMINOPHEN 7.5-325 MG PO TABS: 1.0000 | ORAL_TABLET | Freq: Every day | ORAL | Status: DC

## 2014-07-06 MED ORDER — FENTANYL 75 MCG/HR TD PT72: 75.0000 ug | MEDICATED_PATCH | TRANSDERMAL | Status: DC

## 2014-07-06 NOTE — Patient Instructions (Signed)
Anticoagulation Dose Instructions as of 07/06/2014      [name] Tue Wed Thu Fri Sat   New Dose 5 mg 5 mg 7.5 mg 5 mg 5 mg 5 mg 7.5 mg    Description        Continue usual warfarin dose of 1 and 1/2 tablets on tuesdays and saturdays and 1 tablet all other days.      INR was 2.6 today

## 2014-07-12 ENCOUNTER — Encounter: Payer: Self-pay | Admitting: *Deleted

## 2014-07-13 DIAGNOSIS — J449 Chronic obstructive pulmonary disease, unspecified: Secondary | ICD-10-CM | POA: Diagnosis not present

## 2014-07-17 ENCOUNTER — Other Ambulatory Visit: Payer: Self-pay | Admitting: *Deleted

## 2014-07-17 MED ORDER — ESOMEPRAZOLE MAGNESIUM 40 MG PO CPDR: 40.0000 mg | DELAYED_RELEASE_CAPSULE | Freq: Every day | ORAL | Status: DC

## 2014-07-18 ENCOUNTER — Other Ambulatory Visit: Payer: Self-pay | Admitting: *Deleted

## 2014-07-18 MED ORDER — GLUCOSE BLOOD VI STRP: ORAL_STRIP | Status: DC

## 2014-07-25 ENCOUNTER — Ambulatory Visit: Payer: Medicare Other | Admitting: Neurology

## 2014-08-08 ENCOUNTER — Ambulatory Visit: Payer: Medicare Other | Admitting: Family Medicine

## 2014-08-10 ENCOUNTER — Ambulatory Visit: Payer: Medicare Other | Admitting: Family Medicine

## 2014-08-13 DIAGNOSIS — J449 Chronic obstructive pulmonary disease, unspecified: Secondary | ICD-10-CM | POA: Diagnosis not present

## 2014-08-14 ENCOUNTER — Other Ambulatory Visit: Payer: Self-pay | Admitting: *Deleted

## 2014-08-14 MED ORDER — POTASSIUM CHLORIDE CRYS ER 10 MEQ PO TBCR: 30.0000 meq | EXTENDED_RELEASE_TABLET | Freq: Every day | ORAL | Status: DC

## 2014-08-14 MED ORDER — GLUCOSE BLOOD VI STRP: 1.0000 | ORAL_STRIP | Freq: Four times a day (QID) | Status: DC

## 2014-08-16 ENCOUNTER — Encounter: Payer: Self-pay | Admitting: Family

## 2014-08-16 ENCOUNTER — Ambulatory Visit (INDEPENDENT_AMBULATORY_CARE_PROVIDER_SITE_OTHER): Payer: Medicare Other | Admitting: Family

## 2014-08-16 VITALS — BP 108/84 | HR 56 | Temp 97.0°F

## 2014-08-16 DIAGNOSIS — H01006 Unspecified blepharitis left eye, unspecified eyelid: Secondary | ICD-10-CM

## 2014-08-16 DIAGNOSIS — G894 Chronic pain syndrome: Secondary | ICD-10-CM

## 2014-08-16 DIAGNOSIS — K2100 Gastro-esophageal reflux disease with esophagitis, without bleeding: Secondary | ICD-10-CM

## 2014-08-16 DIAGNOSIS — E291 Testicular hypofunction: Secondary | ICD-10-CM

## 2014-08-16 DIAGNOSIS — E118 Type 2 diabetes mellitus with unspecified complications: Secondary | ICD-10-CM | POA: Diagnosis not present

## 2014-08-16 DIAGNOSIS — E785 Hyperlipidemia, unspecified: Secondary | ICD-10-CM | POA: Diagnosis not present

## 2014-08-16 DIAGNOSIS — E349 Endocrine disorder, unspecified: Secondary | ICD-10-CM

## 2014-08-16 DIAGNOSIS — Z91038 Other insect allergy status: Secondary | ICD-10-CM

## 2014-08-16 DIAGNOSIS — G40909 Epilepsy, unspecified, not intractable, without status epilepticus: Secondary | ICD-10-CM

## 2014-08-16 DIAGNOSIS — N400 Enlarged prostate without lower urinary tract symptoms: Secondary | ICD-10-CM

## 2014-08-16 DIAGNOSIS — R5383 Other fatigue: Secondary | ICD-10-CM

## 2014-08-16 DIAGNOSIS — G4733 Obstructive sleep apnea (adult) (pediatric): Secondary | ICD-10-CM

## 2014-08-16 DIAGNOSIS — H01003 Unspecified blepharitis right eye, unspecified eyelid: Secondary | ICD-10-CM

## 2014-08-16 DIAGNOSIS — F411 Generalized anxiety disorder: Secondary | ICD-10-CM

## 2014-08-16 DIAGNOSIS — I82409 Acute embolism and thrombosis of unspecified deep veins of unspecified lower extremity: Secondary | ICD-10-CM

## 2014-08-16 DIAGNOSIS — J810 Acute pulmonary edema: Secondary | ICD-10-CM | POA: Diagnosis not present

## 2014-08-16 DIAGNOSIS — E559 Vitamin D deficiency, unspecified: Secondary | ICD-10-CM

## 2014-08-16 DIAGNOSIS — Z91030 Bee allergy status: Secondary | ICD-10-CM

## 2014-08-16 LAB — POCT UA - MICROALBUMIN: Microalbumin Ur, POC: 20 mg/L

## 2014-08-16 LAB — POCT GLYCOSYLATED HEMOGLOBIN (HGB A1C): Hemoglobin A1C: 8

## 2014-08-16 LAB — POCT INR: INR: 2

## 2014-08-16 MED ORDER — GLUCOSE BLOOD VI STRP: 1.0000 | ORAL_STRIP | Freq: Four times a day (QID) | Status: DC

## 2014-08-16 MED ORDER — HYDROCODONE-ACETAMINOPHEN 7.5-325 MG PO TABS: 1.0000 | ORAL_TABLET | Freq: Every day | ORAL | Status: DC

## 2014-08-16 MED ORDER — FENTANYL 75 MCG/HR TD PT72: 75.0000 ug | MEDICATED_PATCH | TRANSDERMAL | Status: DC

## 2014-08-16 MED ORDER — BACITRACIN-POLYMYXIN B OP OINT: 1.0000 "application " | TOPICAL_OINTMENT | Freq: Two times a day (BID) | OPHTHALMIC | Status: DC

## 2014-08-16 MED ORDER — DIAZEPAM 5 MG PO TABS: 5.0000 mg | ORAL_TABLET | Freq: Two times a day (BID) | ORAL | Status: DC | PRN

## 2014-08-16 MED ORDER — POTASSIUM CHLORIDE CRYS ER 10 MEQ PO TBCR: 30.0000 meq | EXTENDED_RELEASE_TABLET | Freq: Every day | ORAL | Status: DC

## 2014-08-16 MED ORDER — EPINEPHRINE 0.3 MG/0.3ML IJ SOAJ: 0.3000 mg | Freq: Once | INTRAMUSCULAR | Status: DC

## 2014-08-16 NOTE — Progress Notes (Signed)
Subjective:    Patient ID: [name], male    DOB: 11-02-1965, 49 y.o.   MRN: [id number]  Diabetes He presents for his follow-up diabetic visit. He has type 2 diabetes mellitus. His disease course has been improving. Pertinent negatives for hypoglycemia include no confusion, dizziness or nervousness/anxiousness. Associated symptoms include foot paresthesias. Pertinent negatives for diabetes include no blurred vision, no foot ulcerations and no visual change. There are no hypoglycemic complications. Symptoms are worsening. Diabetic complications include peripheral neuropathy. Pertinent negatives for diabetic complications include no CVA or heart disease. Risk factors for coronary artery disease include diabetes mellitus, dyslipidemia, hypertension, male sex, obesity and sedentary lifestyle. Current diabetic treatment includes insulin injections and oral agent (dual therapy). He is compliant with treatment all of the time. He is following a generally unhealthy diet. His breakfast blood glucose range is generally 130-140 mg/dl. Eye exam is current.  Hyperlipidemia This is a chronic problem. The current episode started more than 1 year ago. The problem is controlled. Recent lipid tests were reviewed and are normal. Exacerbating diseases include diabetes and obesity. He has no history of hypothyroidism. Factors aggravating his hyperlipidemia include fatty foods. Pertinent negatives include no leg pain or myalgias. Current antihyperlipidemic treatment includes statins. The current treatment provides moderate improvement of lipids. Risk factors for coronary artery disease include diabetes mellitus, dyslipidemia, family history, hypertension, obesity, male sex and a sedentary lifestyle.  Gastrophageal Reflux He reports no abdominal pain, no coughing, no heartburn, no nausea or no sore throat. This is a chronic problem. The current episode started more than 1 year ago. The problem occurs rarely. The problem  has been resolved. The symptoms are aggravated by certain foods. He has tried a PPI for the symptoms. The treatment provided significant relief.  Anxiety Presents for follow-up visit. Symptoms include depressed mood, excessive worry and insomnia. Patient reports no confusion, dizziness, nausea, nervous/anxious behavior or panic. Symptoms occur occasionally. The quality of sleep is good.   His past medical history is significant for depression. There is no history of anxiety/panic attacks. Past treatments include SSRIs. The treatment provided moderate relief.  Benign Prostatic Hypertrophy This is a chronic problem. The current episode started more than 1 year ago. The problem has been resolved since onset. Irritative symptoms do not include frequency, nocturia or urgency. Obstructive symptoms do not include an intermittent stream or a weak stream. Pertinent negatives include no dysuria, hematuria, nausea or vomiting. Past treatments include tamsulosin. The treatment provided significant relief.      Review of Systems  Constitutional: Negative.   HENT: Negative.  Negative for sore throat.   Eyes: Negative for blurred vision.  Respiratory: Negative.  Negative for cough.   Cardiovascular: Negative.   Gastrointestinal: Negative.  Negative for heartburn, nausea, vomiting and abdominal pain.  Endocrine: Negative.   Genitourinary: Negative.  Negative for dysuria, urgency, frequency, hematuria and nocturia.  Musculoskeletal: Negative.  Negative for myalgias.  Neurological: Negative.  Negative for dizziness.  Hematological: Negative.   Psychiatric/Behavioral: Negative for confusion. The patient has insomnia. The patient is not nervous/anxious.   All other systems reviewed and are negative.      Objective:   Physical Exam  Constitutional: He is oriented to person, place, and time. He appears well-developed and well-nourished. No distress.  HENT:  Head: Normocephalic.  Right Ear: External ear  normal.  Left Ear: External ear normal.  Mouth/Throat: Oropharynx is clear and moist.  Eyes: Pupils are equal, round, and reactive to light. Right eye exhibits  discharge and exudate (Right eye lid erythemas with mild swelling). Left eye exhibits discharge and exudate.  Neck: Normal range of motion. Neck supple. No thyromegaly present.  Cardiovascular: Normal rate, regular rhythm, normal heart sounds and intact distal pulses.   No murmur heard. Pulmonary/Chest: Effort normal and breath sounds normal. No respiratory distress. He has no wheezes.  Abdominal: Soft. Bowel sounds are normal. He exhibits no distension. There is no tenderness.  Musculoskeletal: Normal range of motion. He exhibits no edema or tenderness.  Pt in wheelchair- Pt can not walk long distances   Neurological: He is alert and oriented to person, place, and time. He has normal reflexes. No cranial nerve deficit.  Skin: Skin is warm and dry. No rash noted. No erythema.  Psychiatric: He has a normal mood and affect. His behavior is normal. Judgment and thought content normal.  Vitals reviewed.  BP 108/84 mmHg  Pulse 56  Temp(Src) 97 F (36.1 C) (Oral)  Ht   Wt    INR at goal     Assessment & Plan:  1. Hyperlipidemia - Lipid panel  2. Type 2 diabetes mellitus with complication - POCT glycosylated hemoglobin (Hb A1C) - CMP14+EGFR - POCT UA - Microalbumin - glucose blood (ACCU-CHEK COMPACT PLUS) test strip; 1 each by Other route 4 (four) times daily.  Dispense: 400 each; Refill: 11  3. Postoperative pulmonary edema - POCT INR  4. Gastroesophageal reflux disease with esophagitis  5. DVT (deep venous thrombosis), unspecified laterality  6. OSA (obstructive sleep apnea)  7. BPH (benign prostatic hyperplasia)  8. Seizure disorder  9. Morbid obesity  10. GAD (generalized anxiety disorder) - diazepam (VALIUM) 5 MG tablet; Take 1 tablet (5 mg total) by mouth every 12 (twelve) hours as needed for anxiety.   Dispense: 60 tablet; Refill: 3  11. Vitamin D deficiency - Vit D  25 hydroxy (rtn osteoporosis monitoring)  12. Chronic pain syndrome - HYDROcodone-acetaminophen (NORCO) 7.5-325 MG per tablet; Take 1 tablet by mouth daily. As needed for BTP (1 tablet per day)  Dispense: 30 tablet; Refill: 0 - fentaNYL (DURAGESIC) 75 MCG/HR; Place 1 patch (75 mcg total) onto the skin every 3 (three) days.  Dispense: 10 patch; Refill: 0  13. Blepharitis of both eyes - bacitracin-polymyxin b, ophth, (POLYSPORIN) OINT; Place 1 application into both eyes every 12 (twelve) hours.  Dispense: 3.5 g; Refill: 0  14. Bee sting allergy - EPINEPHrine (EPIPEN 2-PAK) 0.3 mg/0.3 mL IJ SOAJ injection; Inject 0.3 mLs (0.3 mg total) into the muscle once. As needed for anaphylactic reaction  Dispense: 2 Device; Refill: 2  15. Testosterone deficiency - Testosterone,Free and Total  16. Other fatigue - Thyroid Panel With TSH   Continue all meds Labs pending Health Maintenance reviewed Diet and exercise encouraged RTO 3 months  Evelina Dun, FNP

## 2014-08-16 NOTE — Patient Instructions (Signed)

## 2014-08-16 NOTE — Addendum Note (Signed)
Addended by: Earlene Plater on: 08/16/2014 11:52 AM   Modules accepted: Orders, SmartSet

## 2014-08-17 DIAGNOSIS — G8194 Hemiplegia, unspecified affecting left nondominant side: Secondary | ICD-10-CM | POA: Diagnosis not present

## 2014-08-17 DIAGNOSIS — L89159 Pressure ulcer of sacral region, unspecified stage: Secondary | ICD-10-CM | POA: Diagnosis not present

## 2014-08-17 DIAGNOSIS — G40109 Localization-related (focal) (partial) symptomatic epilepsy and epileptic syndromes with simple partial seizures, not intractable, without status epilepticus: Secondary | ICD-10-CM | POA: Diagnosis not present

## 2014-08-17 LAB — TESTOSTERONE,FREE AND TOTAL
Testosterone, Free: 4.5 pg/mL — ABNORMAL LOW (ref 6.8–21.5)
Testosterone: 241 ng/dL — ABNORMAL LOW (ref 348–1197)

## 2014-08-17 LAB — THYROID PANEL WITH TSH
Free Thyroxine Index: 1.4 (ref 1.2–4.9)
T3 Uptake Ratio: 23 % — ABNORMAL LOW (ref 24–39)
T4, Total: 5.9 ug/dL (ref 4.5–12.0)
TSH: 2.11 u[IU]/mL (ref 0.450–4.500)

## 2014-08-17 LAB — CMP14+EGFR
ALT: 15 IU/L (ref 0–44)
AST: 20 IU/L (ref 0–40)
Albumin/Globulin Ratio: 1.1 (ref 1.1–2.5)
Albumin: 3.7 g/dL (ref 3.5–5.5)
Alkaline Phosphatase: 95 IU/L (ref 39–117)
BUN/Creatinine Ratio: 15 (ref 9–20)
BUN: 26 mg/dL — ABNORMAL HIGH (ref 6–24)
Bilirubin Total: 0.5 mg/dL (ref 0.0–1.2)
CO2: 27 mmol/L (ref 18–29)
Calcium: 8.7 mg/dL (ref 8.7–10.2)
Chloride: 94 mmol/L — ABNORMAL LOW (ref 97–108)
Creatinine, Ser: 1.71 mg/dL — ABNORMAL HIGH (ref 0.76–1.27)
GFR calc Af Amer: 54 mL/min/{1.73_m2} — ABNORMAL LOW (ref >59)
GFR calc non Af Amer: 47 mL/min/{1.73_m2} — ABNORMAL LOW (ref >59)
Globulin, Total: 3.5 g/dL (ref 1.5–4.5)
Glucose: 260 mg/dL — ABNORMAL HIGH (ref 65–99)
Potassium: 4 mmol/L (ref 3.5–5.2)
Sodium: 138 mmol/L (ref 134–144)
Total Protein: 7.2 g/dL (ref 6.0–8.5)

## 2014-08-17 LAB — LIPID PANEL
Chol/HDL Ratio: 4.2 ratio units (ref 0.0–5.0)
Cholesterol, Total: 122 mg/dL (ref 100–199)
HDL: 29 mg/dL — ABNORMAL LOW (ref >39)
LDL Calculated: 56 mg/dL (ref 0–99)
Triglycerides: 184 mg/dL — ABNORMAL HIGH (ref 0–149)
VLDL Cholesterol Cal: 37 mg/dL (ref 5–40)

## 2014-08-17 LAB — VITAMIN D 25 HYDROXY (VIT D DEFICIENCY, FRACTURES): Vit D, 25-Hydroxy: 29.4 ng/mL — ABNORMAL LOW (ref 30.0–100.0)

## 2014-08-17 LAB — MICROALBUMIN, URINE: Microalbumin, Urine: 43.3 ug/mL — ABNORMAL HIGH (ref 0.0–17.0)

## 2014-08-20 ENCOUNTER — Other Ambulatory Visit: Payer: Self-pay | Admitting: Family

## 2014-08-20 ENCOUNTER — Encounter: Payer: Self-pay | Admitting: Neurology

## 2014-08-20 ENCOUNTER — Ambulatory Visit (INDEPENDENT_AMBULATORY_CARE_PROVIDER_SITE_OTHER): Payer: Medicare Other | Admitting: Neurology

## 2014-08-20 VITALS — BP 122/68 | HR 66 | Ht 75.0 in | Wt >= 6400 oz

## 2014-08-20 DIAGNOSIS — G43009 Migraine without aura, not intractable, without status migrainosus: Secondary | ICD-10-CM

## 2014-08-20 DIAGNOSIS — R569 Unspecified convulsions: Secondary | ICD-10-CM | POA: Diagnosis not present

## 2014-08-20 MED ORDER — RIZATRIPTAN BENZOATE 10 MG PO TABS: ORAL_TABLET | ORAL | Status: DC

## 2014-08-20 MED ORDER — CANAGLIFLOZIN 300 MG PO TABS: 300.0000 mg | ORAL_TABLET | Freq: Every day | ORAL | Status: DC

## 2014-08-20 MED ORDER — TESTOSTERONE 50 MG/5GM (1%) TD GEL: 5.0000 g | Freq: Every day | TRANSDERMAL | Status: DC
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
## 2014-08-20 NOTE — Patient Instructions (Addendum)
Overall you are doing fairly well but I do want to suggest a few things today:   Remember to drink plenty of fluid, eat healthy meals and do not skip any meals. Try to eat protein with a every meal and eat a healthy snack such as fruit or nuts in between meals. Try to keep a regular sleep-wake schedule and try to exercise daily, particularly in the form of walking, 20-30 minutes a day, if you can.   As far as your medications are concerned, I would like to suggest: discontinue Topamax  As far as plan: Sphenocath next Friday at 11:30 March 4th.  Diagnosis code G43.709 Chronic Migraine w/o aura. W/o status migrainosus not intractable [id number] [phone]  I would like to see you back on March 4th, sooner if we need to. Please call us with any interim questions, concerns, problems, updates or refill requests.   Please also call us for any test results so we can go over those with you on the phone.  My clinical assistant and will answer any of your questions and relay your messages to me and also relay most of my messages to you.   Our phone number is [phone]. We also have an after hours call service for urgent matters and there is a physician on-call for urgent questions. For any emergencies you know to call 911 or go to the nearest emergency room

## 2014-08-21 ENCOUNTER — Other Ambulatory Visit: Payer: Self-pay | Admitting: *Deleted

## 2014-08-31 ENCOUNTER — Telehealth: Payer: Self-pay | Admitting: *Deleted

## 2014-08-31 ENCOUNTER — Ambulatory Visit: Payer: Self-pay | Admitting: Neurology

## 2014-08-31 NOTE — Telephone Encounter (Signed)
Called and rescheduled sphenocath procedure for 09/03/14 at 11:15 am. Told pt to arrive 15 min prior to appt. Time. Pt verbalized understanding.

## 2014-08-31 NOTE — Telephone Encounter (Signed)
Patient needs to be r/s for the sphenocath. Please call and r/s

## 2014-09-03 ENCOUNTER — Ambulatory Visit (INDEPENDENT_AMBULATORY_CARE_PROVIDER_SITE_OTHER): Payer: Medicare Other | Admitting: Neurology

## 2014-09-03 ENCOUNTER — Encounter: Payer: Self-pay | Admitting: Neurology

## 2014-09-03 VITALS — BP 116/71 | HR 68 | Ht 75.0 in

## 2014-09-03 DIAGNOSIS — G43709 Chronic migraine without aura, not intractable, without status migrainosus: Secondary | ICD-10-CM | POA: Diagnosis not present

## 2014-09-03 NOTE — Patient Instructions (Addendum)
Next appointment 09/10/2014 at 11:30am Stay well hydrated and get a good night's sleep

## 2014-09-04 NOTE — Progress Notes (Signed)
    [facility] PROCEDURE NOTE  History: [name] is a 49 y.o. male here as a follow up for headache ith a complicated past medical history including morbid obesity(400lbs), chronic pain, seizures, migraines, diabetes, depression, anxiety, kidney disorder, OSA, hypoxia, DM2, Obesity hypoventilation syndrome,PE and DVT on chronic warfarin. He is here for follow up of headache. LP and ophthalmology eval did not show IIH. MR brain and MRV were unremarkable. Here for treatment of migraine. He was started on Topamax at last visit, did not tolerate. He follows for his OSA and uses O2 for hypoventilation obesity syndrome. He is taking maxalt for the headaches.He is on Lamictal 200mg  bid for mood and Keppra 1g bid for seizures. He went crazy, his brain was "out to lunch" on the Topamax. He has 1-2 migraines a week that last for 4-5 days out of the week. He is taking maxalt for the headaches. His eyes start hurting. It'll progress into a headache. Has light sensitivity, sound sensitivy but not nausea or vomiting. Eye pressure 10/10 severe pain. Vision blurs with the headaches. He has obesity hypoventilation syndrome and unclear if he is using his oxygen every night. Headaches come every several days and last up to 24 hours. "20/10" pain. MRI/MRV unremarkable. LP with normal opening pressure. Opthalmology exam wnl.   Procedure: The patient was placed in the supine position. A temperature strip was added to the cheek area after the area was cleaned with alcohol. The Sphenocath was lubricated with gel, and placed in the right naris. The catheter was inserted above the middle turbinate to the posterior nasal cavity, and then withdrawn 1 cm. The catheter was deployed and rotated approximately 20 towards the nose. 2-1/2 mL of 2% lidocaine was deployed. The patient was asked to swallow during the injection. The patient demonstrated erythema of the sclera of the eye on this side, and an increase in the cheek  temperature was noted from 96 F to 98 F.  This process was repeated on the left side, with similar results. The increase in cheek temperature was documented from 54 F to 58 F.  The patient tolerated the procedure well. No complications of the procedure were noted. The patient was kept in the supine position for 8 minutes following the procedure. She was given small sips of water after sitting up following the procedure.  Lidocaine 2% NDC [id number]  Expiration date: 11/19 Lot number: [id number]  [name]

## 2014-09-08 ENCOUNTER — Emergency Department (HOSPITAL_BASED_OUTPATIENT_CLINIC_OR_DEPARTMENT_OTHER)
Admission: EM | Admit: 2014-09-08 | Discharge: 2014-09-08 | Disposition: A | Discharge status: Home / Self Care | Payer: Medicare Other | Attending: Emergency Medicine | Admitting: Emergency Medicine

## 2014-09-08 ENCOUNTER — Emergency Department (HOSPITAL_BASED_OUTPATIENT_CLINIC_OR_DEPARTMENT_OTHER): Payer: Medicare Other

## 2014-09-08 ENCOUNTER — Encounter (HOSPITAL_BASED_OUTPATIENT_CLINIC_OR_DEPARTMENT_OTHER): Payer: Self-pay

## 2014-09-08 DIAGNOSIS — Z862 Personal history of diseases of the blood and blood-forming organs and certain disorders involving the immune mechanism: Secondary | ICD-10-CM | POA: Diagnosis not present

## 2014-09-08 DIAGNOSIS — Z880 Allergy status to penicillin: Secondary | ICD-10-CM | POA: Insufficient documentation

## 2014-09-08 DIAGNOSIS — K219 Gastro-esophageal reflux disease without esophagitis: Secondary | ICD-10-CM | POA: Diagnosis not present

## 2014-09-08 DIAGNOSIS — Z7901 Long term (current) use of anticoagulants: Secondary | ICD-10-CM | POA: Diagnosis not present

## 2014-09-08 DIAGNOSIS — Z794 Long term (current) use of insulin: Secondary | ICD-10-CM | POA: Insufficient documentation

## 2014-09-08 DIAGNOSIS — R2241 Localized swelling, mass and lump, right lower limb: Secondary | ICD-10-CM | POA: Diagnosis not present

## 2014-09-08 DIAGNOSIS — G43909 Migraine, unspecified, not intractable, without status migrainosus: Secondary | ICD-10-CM | POA: Insufficient documentation

## 2014-09-08 DIAGNOSIS — G40909 Epilepsy, unspecified, not intractable, without status epilepticus: Secondary | ICD-10-CM | POA: Diagnosis not present

## 2014-09-08 DIAGNOSIS — F419 Anxiety disorder, unspecified: Secondary | ICD-10-CM | POA: Insufficient documentation

## 2014-09-08 DIAGNOSIS — Z86711 Personal history of pulmonary embolism: Secondary | ICD-10-CM | POA: Insufficient documentation

## 2014-09-08 DIAGNOSIS — E669 Obesity, unspecified: Secondary | ICD-10-CM | POA: Insufficient documentation

## 2014-09-08 DIAGNOSIS — Z86718 Personal history of other venous thrombosis and embolism: Secondary | ICD-10-CM | POA: Insufficient documentation

## 2014-09-08 DIAGNOSIS — Z7951 Long term (current) use of inhaled steroids: Secondary | ICD-10-CM | POA: Insufficient documentation

## 2014-09-08 DIAGNOSIS — Z79899 Other long term (current) drug therapy: Secondary | ICD-10-CM | POA: Diagnosis not present

## 2014-09-08 DIAGNOSIS — N189 Chronic kidney disease, unspecified: Secondary | ICD-10-CM | POA: Insufficient documentation

## 2014-09-08 DIAGNOSIS — M1990 Unspecified osteoarthritis, unspecified site: Secondary | ICD-10-CM | POA: Insufficient documentation

## 2014-09-08 DIAGNOSIS — E119 Type 2 diabetes mellitus without complications: Secondary | ICD-10-CM | POA: Insufficient documentation

## 2014-09-08 DIAGNOSIS — F319 Bipolar disorder, unspecified: Secondary | ICD-10-CM | POA: Diagnosis not present

## 2014-09-08 DIAGNOSIS — Z9889 Other specified postprocedural states: Secondary | ICD-10-CM | POA: Insufficient documentation

## 2014-09-08 DIAGNOSIS — Z91040 Latex allergy status: Secondary | ICD-10-CM | POA: Insufficient documentation

## 2014-09-08 DIAGNOSIS — G894 Chronic pain syndrome: Secondary | ICD-10-CM | POA: Diagnosis not present

## 2014-09-08 DIAGNOSIS — Z9981 Dependence on supplemental oxygen: Secondary | ICD-10-CM | POA: Diagnosis not present

## 2014-09-08 DIAGNOSIS — M25561 Pain in right knee: Secondary | ICD-10-CM | POA: Diagnosis not present

## 2014-09-08 DIAGNOSIS — M79604 Pain in right leg: Secondary | ICD-10-CM

## 2014-09-08 NOTE — ED Notes (Signed)
Pt reports hx of 3 DVTs and 4 PEs, currently on coumadin.  States having pain in right leg behind knee and calf area.   Denies cp or sob.

## 2014-09-08 NOTE — Discharge Instructions (Signed)
Your ultrasound did not show any evidence of a blood clot. Continue coumadin. Continue pain medications. Follow up with your doctor on Monday if pain continues.
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
## 2014-09-10 ENCOUNTER — Telehealth: Payer: Self-pay | Admitting: Family

## 2014-09-10 ENCOUNTER — Ambulatory Visit (INDEPENDENT_AMBULATORY_CARE_PROVIDER_SITE_OTHER): Payer: Medicare Other | Admitting: Neurology

## 2014-09-10 VITALS — BP 122/70 | HR 55 | Ht 75.0 in

## 2014-09-10 DIAGNOSIS — G894 Chronic pain syndrome: Secondary | ICD-10-CM

## 2014-09-10 DIAGNOSIS — G43009 Migraine without aura, not intractable, without status migrainosus: Secondary | ICD-10-CM | POA: Diagnosis not present

## 2014-09-10 MED ORDER — HYDROCODONE-ACETAMINOPHEN 7.5-325 MG PO TABS: 1.0000 | ORAL_TABLET | Freq: Every day | ORAL | Status: DC

## 2014-09-10 MED ORDER — FENTANYL 75 MCG/HR TD PT72: 75.0000 ug | MEDICATED_PATCH | TRANSDERMAL | Status: DC

## 2014-09-10 NOTE — Telephone Encounter (Signed)
Rx's printed and sign by PCP - Evelina Dun, NP Patient notified - wife will pick up at front desk.

## 2014-09-11 DIAGNOSIS — J449 Chronic obstructive pulmonary disease, unspecified: Secondary | ICD-10-CM | POA: Diagnosis not present

## 2014-09-12 NOTE — Progress Notes (Signed)
    [facility] PROCEDURE NOTE  History: History: [name] is a 49 y.o. male here as a follow up for headache ith a complicated past medical history including morbid obesity(400lbs), chronic pain, seizures, migraines, diabetes, depression, anxiety, kidney disorder, OSA, hypoxia, DM2, Obesity hypoventilation syndrome,PE and DVT on chronic warfarin. He is here for follow up of headache. LP and ophthalmology eval did not show IIH. MR brain and MRV were unremarkable. Here for treatment of migraine. He was started on Topamax at last visit, did not tolerate. He follows for his OSA and uses O2 for hypoventilation obesity syndrome. He is taking maxalt for the headaches.He is on Lamictal 200mg  bid for mood and Keppra 1g bid for seizures. He went crazy, his brain was "out to lunch" on the Topamax. He has 1-2 migraines a week that last for 4-5 days out of the week. He is taking maxalt for the headaches. His eyes start hurting. It'll progress into a headache. Has light sensitivity, sound sensitivy but not nausea or vomiting. Eye pressure 10/10 severe pain. Vision blurs with the headaches. He has obesity hypoventilation syndrome and unclear if he is using his oxygen every night. Headaches come every several days and last up to 24 hours. "20/10" pain. MRI/MRV unremarkable. LP with normal opening pressure. Opthalmology exam wnl.  Headaches have improved with the sphenocath, he resents for his next procedure.    Procedure: The patient was placed in the supine position. A temperature strip was added to the cheek area after the area was cleaned with alcohol. The Sphenocath was lubricated with gel, and placed in the right naris. The catheter was inserted above the middle turbinate to the posterior nasal cavity, and then withdrawn 1 cm. The catheter was deployed and rotated approximately 20 towards the nose. 2-1/2 mL of 2% lidocaine was deployed. The patient was asked to swallow during the injection. The patient  demonstrated erythema of the sclera of the eye on this side, and an increase in the cheek temperature was noted from 98 F to 100 F.  This process was repeated on the left side, with similar results. The increase in cheek temperature was documented from 63 F to 100 F.  The patient tolerated the procedure well. No complications of the procedure were noted. The patient was kept in the supine position for 8 minutes following the procedure. She was given small sips of water after sitting up following the procedure.  Lidocaine 2% NDC [id number]  Expiration date: 12/28/2015 Lot number: [id number]  [name]

## 2014-09-17 ENCOUNTER — Ambulatory Visit (INDEPENDENT_AMBULATORY_CARE_PROVIDER_SITE_OTHER): Payer: Medicare Other | Admitting: Neurology

## 2014-09-17 VITALS — BP 115/71 | HR 58 | Temp 97.8°F | Ht 75.0 in

## 2014-09-17 DIAGNOSIS — G43709 Chronic migraine without aura, not intractable, without status migrainosus: Secondary | ICD-10-CM | POA: Diagnosis not present

## 2014-09-18 ENCOUNTER — Encounter: Payer: Self-pay | Admitting: Neurology

## 2014-09-18 NOTE — Progress Notes (Signed)
    [facility] PROCEDURE NOTE  History: [name] is a 49 y.o. male here as a follow up for headache ith a complicated past medical history including morbid obesity(400lbs), chronic pain, seizures, migraines, diabetes, depression, anxiety, kidney disorder, OSA, hypoxia, DM2, Obesity hypoventilation syndrome,PE and DVT on chronic warfarin. He is here for follow up of headache. LP and ophthalmology eval did not show IIH. MR brain and MRV were unremarkable. Here for treatment of migraine. He was started on Topamax at last visit, did not tolerate. He follows for his OSA and uses O2 for hypoventilation obesity syndrome. He is taking maxalt for the headaches.He is on Lamictal 200mg  bid for mood and Keppra 1g bid for seizures. He went crazy, his brain was "out to lunch" on the Topamax. He has 1-2 migraines a week, improved. Headaches have improved with the sphenocath, he resents for his next procedure.   Headache: He is taking maxalt for the headaches. His eyes start hurting. It'll progress into a headache. Has light sensitivity, sound sensitivy but not nausea or vomiting. Eye pressure 10/10 severe pain. Vision blurs with the headaches. He has obesity hypoventilation syndrome and unclear if he is using his oxygen every night. Headaches come every several days and last up to 24 hours. "20/10" pain. MRI/MRV unremarkable. LP with normal opening pressure. Opthalmology exam wnl. At least 20 days a month.   Procedure: The patient was placed in the supine position. A temperature strip was added to the cheek area after the area was cleaned with alcohol. The Sphenocath was lubricated with gel, and placed in the right naris. The catheter was inserted above the middle turbinate to the posterior nasal cavity, and then withdrawn 1 cm. The catheter was deployed and rotated approximately 20 towards the nose. 2-1/2 mL of 2% lidocaine was deployed. The patient was asked to swallow during the injection. The patient  demonstrated erythema of the sclera of the eye on this side, and an increase in the cheek temperature was noted from 98 F to 100 F.  He did not tolerate the procedure on the left side today.  The patient tolerated the procedure well. No complications of the procedure were noted. The patient was kept in the supine position for 8 minutes following the procedure. She was given small sips of water after sitting up following the procedure.  Lidocaine 2% NDC [id number]  Expiration date: 12/28/2015 Lot number: [id number]  [name]

## 2014-09-20 ENCOUNTER — Encounter: Payer: Self-pay | Admitting: Neurology

## 2014-09-28 ENCOUNTER — Ambulatory Visit (INDEPENDENT_AMBULATORY_CARE_PROVIDER_SITE_OTHER): Payer: Medicare Other | Admitting: Pharmacist

## 2014-09-28 DIAGNOSIS — I82409 Acute embolism and thrombosis of unspecified deep veins of unspecified lower extremity: Secondary | ICD-10-CM | POA: Diagnosis not present

## 2014-09-28 DIAGNOSIS — J810 Acute pulmonary edema: Secondary | ICD-10-CM | POA: Diagnosis not present

## 2014-09-28 DIAGNOSIS — G894 Chronic pain syndrome: Secondary | ICD-10-CM

## 2014-09-28 LAB — POCT INR: INR: 2.4

## 2014-09-28 MED ORDER — FENTANYL 75 MCG/HR TD PT72: 75.0000 ug | MEDICATED_PATCH | TRANSDERMAL | Status: DC

## 2014-09-28 MED ORDER — HYDROCODONE-ACETAMINOPHEN 7.5-325 MG PO TABS: 1.0000 | ORAL_TABLET | Freq: Every day | ORAL | Status: DC

## 2014-09-28 NOTE — Patient Instructions (Signed)
Anticoagulation Dose Instructions as of 09/28/2014      Dorene Grebe Tue Wed Thu Fri Sat   New Dose 5 mg 5 mg 7.5 mg 5 mg 5 mg 5 mg 7.5 mg    Description        Continue usual warfarin dose of 1 and 1/2 tablets on tuesdays and saturdays and 1 tablet all other days.      INR was 2.4 today

## 2014-10-01 ENCOUNTER — Encounter: Payer: Self-pay | Admitting: Neurology

## 2014-10-01 ENCOUNTER — Ambulatory Visit (INDEPENDENT_AMBULATORY_CARE_PROVIDER_SITE_OTHER): Payer: Medicare Other | Admitting: Neurology

## 2014-10-01 VITALS — BP 125/70 | HR 64 | Temp 98.3°F | Ht 75.0 in

## 2014-10-01 DIAGNOSIS — G43719 Chronic migraine without aura, intractable, without status migrainosus: Secondary | ICD-10-CM

## 2014-10-01 NOTE — Progress Notes (Signed)
[facility] PROCEDURE NOTE  History: [name] is a 49 y.o. male here as a follow up for headache ith a complicated past medical history including morbid obesity(400lbs), chronic pain, seizures, migraines, diabetes, depression, anxiety, kidney disorder, OSA, hypoxia, DM2, Obesity hypoventilation syndrome,PE and DVT on chronic warfarin. He is here for follow up of headache. LP and ophthalmology eval did not show IIH. MR brain and MRV were unremarkable. Here for treatment of migraine. He was started on Topamax at last visit, did not tolerate. He follows for his OSA and uses O2 for hypoventilation obesity syndrome. He is taking maxalt for the headaches.He is on Lamictal 200mg  bid for mood and Keppra 1g bid for seizures. He went crazy, his brain was "out to lunch" on the Topamax.    Headache: He is taking maxalt for the headaches. His eyes start hurting. It'll progress into a headache. Has light sensitivity, sound sensitivy but not nausea or vomiting. Eye pressure 10/10 severe pain. Vision blurs with the headaches. He has obesity hypoventilation syndrome and unclear if he is using his oxygen every night. Headaches come every several days and last up to 24 hours. "20/10" pain. MRI/MRV unremarkable. LP with normal opening pressure. Opthalmology exam wnl. At least 20 days a month of headache.  He has seen improvement with sphenocath to 1-2 migraines a week, significantly improved. Headaches have improved with the sphenocath, he presents for his next procedure. Last sphenocath was 2 weeks ago and he went almost this whole time without a migraine.   Procedure: The patient was placed in the supine position. A temperature strip was added to the cheek area after the area was cleaned with alcohol. The Sphenocath was lubricated with gel, and placed in the right naris. The catheter was inserted above the middle turbinate to the posterior nasal cavity, and then withdrawn 1 cm. The catheter was deployed and  rotated approximately 20 towards the nose. 2-1/2 mL of 2% lidocaine was deployed. The patient was asked to swallow during the injection. The patient demonstrated erythema of the sclera of the eye on this side, and an increase in the cheek temperature was noted from 100 F to 102 F.  This process was repeated on the left side, with similar results. The increase in cheek temperature was documented from 30 F to 100 F.  The patient tolerated the procedure well. No complications of the procedure were noted. The patient was kept in the supine position for 8 minutes following the procedure. She was given small sips of water after sitting up following the procedure.  Lidocaine 2% NDC [id number]  Expiration date: 11/19 Lot [id number]  [name]

## 2014-10-02 DIAGNOSIS — G43719 Chronic migraine without aura, intractable, without status migrainosus: Secondary | ICD-10-CM | POA: Insufficient documentation

## 2014-10-12 DIAGNOSIS — J449 Chronic obstructive pulmonary disease, unspecified: Secondary | ICD-10-CM | POA: Diagnosis not present

## 2014-10-15 ENCOUNTER — Encounter: Payer: Self-pay | Admitting: Neurology

## 2014-10-15 ENCOUNTER — Ambulatory Visit (INDEPENDENT_AMBULATORY_CARE_PROVIDER_SITE_OTHER): Payer: Medicare Other | Admitting: Neurology

## 2014-10-15 VITALS — BP 127/72 | HR 66 | Ht 75.0 in | Wt >= 6400 oz

## 2014-10-15 DIAGNOSIS — G43009 Migraine without aura, not intractable, without status migrainosus: Secondary | ICD-10-CM | POA: Diagnosis not present

## 2014-10-15 NOTE — Progress Notes (Signed)
[name] PROCEDURE NOTE  Update: Since starting Sphenocath, patient's migraine frequency has decreased to one every 2 weeks with improved severity. This is a significant improvement. He presents for his next procedure. Last sphenocath was 2 weeks ago and he went almost this whole time without a migraine. Will schedule next sphenocath for 2 weeks form now and then try to increase the time in between to 3 then possibly 4 weeks in between.    Past medical History: [name] is a 49 y.o. male here as a follow up for headache ith a complicated past medical history including morbid obesity(400lbs), chronic pain, seizures, migraines, diabetes, depression, anxiety, kidney disorder, OSA, hypoxia, DM2, Obesity hypoventilation syndrome,PE and DVT on chronic warfarin. He is here for follow up of headache. LP and ophthalmology eval did not show IIH. MR brain and MRV were unremarkable. Here for treatment of migraine. He was started on Topamax , did not tolerate. He follows for his OSA and uses O2 for hypoventilation obesity syndrome. He is taking maxalt for acute management the headaches.He is on Lamictal 200mg  bid for mood and Keppra 1g bid for seizures. He went crazy, his brain was "out to lunch" on the Topamax. He has tried multiple migraine medications in the past, can't even remember how many, including most first-line migraine medications.   Headache history: He is taking maxalt for the headaches acutely. His eyes start hurting. It'll progress into a headache. Has light sensitivity, sound sensitivy but not nausea or vomiting. Eye pressure 10/10 severe pain. Vision blurs with the headaches. He has obesity hypoventilation syndrome and unclear if he is using his oxygen every night. Headaches come every several days to daily and last up to 24 hours. "20/10" pain. MRI/MRV unremarkable. LP with normal opening pressure. Opthalmology exam wnl. At least 20 days a month of headache.   Procedure: The patient  was placed in the supine position. A temperature strip was added to the cheek area after the area was cleaned with alcohol. The Sphenocath was lubricated with gel, and placed in the right naris. The catheter was inserted above the middle turbinate to the posterior nasal cavity, and then withdrawn 1 cm. The catheter was deployed and rotated approximately 20 towards the nose. 2-1/2 mL of 2% lidocaine was deployed. The patient was asked to swallow during the injection. The patient demonstrated erythema of the sclera of the eye on this side, and an increase in the cheek temperature was noted from 106F to 100 F.  This process was repeated on the left side, with similar results. The increase in cheek temperature was documented from 73 F to 100 F.  The patient tolerated the procedure well. No complications of the procedure were noted. The patient was kept in the supine position for 8 minutes following the procedure. She was given small sips of water after sitting up following the procedure.  Lidocaine 2% NDC [id number]  Expiration date: 11/19 Lot [id number]  [name]

## 2014-10-19 ENCOUNTER — Telehealth: Payer: Self-pay | Admitting: Family

## 2014-10-19 DIAGNOSIS — G894 Chronic pain syndrome: Secondary | ICD-10-CM

## 2014-10-19 DIAGNOSIS — E1121 Type 2 diabetes mellitus with diabetic nephropathy: Secondary | ICD-10-CM

## 2014-10-19 MED ORDER — INSULIN GLARGINE 100 UNIT/ML SOLOSTAR PEN: 50.0000 [IU] | PEN_INJECTOR | Freq: Two times a day (BID) | SUBCUTANEOUS | Status: DC

## 2014-10-19 MED ORDER — INSULIN LISPRO 100 UNIT/ML (KWIKPEN): 35.0000 [IU] | PEN_INJECTOR | Freq: Three times a day (TID) | SUBCUTANEOUS | Status: DC

## 2014-10-19 NOTE — Telephone Encounter (Signed)
Prescription sent to pharmacy.

## 2014-10-19 NOTE — Telephone Encounter (Signed)
Patient is requesting a 90 supply please

## 2014-10-23 ENCOUNTER — Other Ambulatory Visit: Payer: Self-pay | Admitting: *Deleted

## 2014-10-23 MED ORDER — ESOMEPRAZOLE MAGNESIUM 40 MG PO CPDR: 40.0000 mg | DELAYED_RELEASE_CAPSULE | Freq: Every day | ORAL | Status: DC

## 2014-10-24 ENCOUNTER — Other Ambulatory Visit: Payer: Self-pay | Admitting: Family

## 2014-10-24 DIAGNOSIS — E1121 Type 2 diabetes mellitus with diabetic nephropathy: Secondary | ICD-10-CM

## 2014-10-24 MED ORDER — INSULIN GLARGINE 100 UNIT/ML SOLOSTAR PEN: 50.0000 [IU] | PEN_INJECTOR | Freq: Two times a day (BID) | SUBCUTANEOUS | Status: DC

## 2014-10-24 NOTE — Telephone Encounter (Signed)
done

## 2014-10-26 ENCOUNTER — Encounter: Payer: Self-pay | Admitting: Family

## 2014-10-26 ENCOUNTER — Ambulatory Visit (INDEPENDENT_AMBULATORY_CARE_PROVIDER_SITE_OTHER): Payer: Medicare Other | Admitting: Family

## 2014-10-26 VITALS — BP 120/76 | HR 62 | Temp 98.4°F

## 2014-10-26 DIAGNOSIS — M25512 Pain in left shoulder: Secondary | ICD-10-CM

## 2014-10-26 DIAGNOSIS — J069 Acute upper respiratory infection, unspecified: Secondary | ICD-10-CM

## 2014-10-26 MED ORDER — AZITHROMYCIN 250 MG PO TABS: ORAL_TABLET | ORAL | Status: DC

## 2014-10-26 MED ORDER — BENZONATATE 200 MG PO CAPS: 200.0000 mg | ORAL_CAPSULE | Freq: Three times a day (TID) | ORAL | Status: DC | PRN

## 2014-10-26 NOTE — Addendum Note (Signed)
Addended by: Evelina Dun A on: 10/26/2014 11:27 AM   Modules accepted: Orders

## 2014-10-26 NOTE — Patient Instructions (Signed)
Upper Respiratory Infection, Adult An upper respiratory infection (URI) is also sometimes known as the common cold. The upper respiratory tract includes the nose, sinuses, throat, trachea, and bronchi. Bronchi are the airways leading to the lungs. Most people improve within 1 week, but symptoms can last up to 2 weeks. A residual cough may last even longer.  CAUSES Many different viruses can infect the tissues lining the upper respiratory tract. The tissues become irritated and inflamed and often become very moist. Mucus production is also common. A cold is contagious. You can easily spread the virus to others by oral contact. This includes kissing, sharing a glass, coughing, or sneezing. Touching your mouth or nose and then touching a surface, which is then touched by another person, can also spread the virus. SYMPTOMS  Symptoms typically develop 1 to 3 days after you come in contact with a cold virus. Symptoms vary from person to person. They may include:  Runny nose.  Sneezing.  Nasal congestion.  Sinus irritation  Sore throat.  Loss of voice (laryngitis).  Cough.  Fatigue.  Muscle aches.  Loss of appetite.  Headache.  Low-grade fever. DIAGNOSIS  You might diagnose your own cold based on familiar symptoms, since most people get a cold 2 to 3 times a year. Your caregiver can confirm this based on your exam. Most importantly, your caregiver can check that your symptoms are not due to another disease such as strep throat, sinusitis, pneumonia, asthma, or epiglottitis. Blood tests, throat tests, and X-rays are not necessary to diagnose a common cold, but they may sometimes be helpful in excluding other more serious diseases. Your caregiver will decide if any further tests are required. RISKS AND COMPLICATIONS  You may be at risk for a more severe case of the common cold if you smoke cigarettes, have chronic heart disease (such as heart failure) or lung disease (such as asthma), or if  you have a weakened immune system. The very young and very old are also at risk for more serious infections. Bacterial sinusitis, middle ear infections, and bacterial pneumonia can complicate the common cold. The common cold can worsen asthma and chronic obstructive pulmonary disease (COPD). Sometimes, these complications can require emergency medical care and may be life-threatening. PREVENTION  The best way to protect against getting a cold is to practice good hygiene. Avoid oral or hand contact with people with cold symptoms. Wash your hands often if contact occurs. There is no clear evidence that vitamin C, vitamin E, echinacea, or exercise reduces the chance of developing a cold. However, it is always recommended to get plenty of rest and practice good nutrition. TREATMENT  Treatment is directed at relieving symptoms. There is no cure. Antibiotics are not effective, because the infection is caused by a virus, not by bacteria. Treatment may include:  Increased fluid intake. Sports drinks offer valuable electrolytes, sugars, and fluids.  Breathing heated mist or steam (vaporizer or shower).  Eating chicken soup or other clear broths, and maintaining good nutrition.  Getting plenty of rest.  Using gargles or lozenges for comfort.  Controlling fevers with ibuprofen or acetaminophen as directed by your caregiver.  Increasing usage of your inhaler if you have asthma. Zinc gel and zinc lozenges, taken in the first 24 hours of the common cold, can shorten the duration and lessen the severity of symptoms. Pain medicines may help with fever, muscle aches, and throat pain. A variety of non-prescription medicines are available to treat congestion and runny nose. Your caregiver  can make recommendations and may suggest nasal or lung inhalers for other symptoms.  HOME CARE INSTRUCTIONS   Only take over-the-counter or prescription medicines for pain, discomfort, or fever as directed by your  caregiver.  Use a warm mist humidifier or inhale steam from a shower to increase air moisture. This may keep secretions moist and make it easier to breathe.  Drink enough water and fluids to keep your urine clear or pale yellow.  Rest as needed.  Return to work when your temperature has returned to normal or as your caregiver advises. You may need to stay home longer to avoid infecting others. You can also use a face mask and careful hand washing to prevent spread of the virus. SEEK MEDICAL CARE IF:   After the first few days, you feel you are getting worse rather than better.  You need your caregiver's advice about medicines to control symptoms.  You develop chills, worsening shortness of breath, or brown or red sputum. These may be signs of pneumonia.  You develop yellow or brown nasal discharge or pain in the face, especially when you bend forward. These may be signs of sinusitis.  You develop a fever, swollen neck glands, pain with swallowing, or white areas in the back of your throat. These may be signs of strep throat. SEEK IMMEDIATE MEDICAL CARE IF:   You have a fever.  You develop severe or persistent headache, ear pain, sinus pain, or chest pain.  You develop wheezing, a prolonged cough, cough up blood, or have a change in your usual mucus (if you have chronic lung disease).  You develop sore muscles or a stiff neck. Document Released: 12/09/2000 Document Revised: 09/07/2011 Document Reviewed: 09/20/2013 [facility] Patient Information 2015 [place], [place]. This information is not intended to replace advice given to you by your health care provider. Make sure you discuss any questions you have with your health care provider.  - Take meds as prescribed - Use a cool mist humidifier  -Use saline nose sprays frequently -Saline irrigations of the nose can be very helpful if done frequently.  * 4X daily for 1 week*  * Use of a nettie pot can be helpful with this. Follow  directions with this* -Force fluids -For any cough or congestion  Use plain Mucinex- regular strength or max strength is fine   * Children- consult with Pharmacist for dosing -For fever or aces or pains- take tylenol or ibuprofen appropriate for age and weight.  * for fevers greater than 101 orally you may alternate ibuprofen and tylenol every  3 hours. -Throat lozenges if help -New toothbrush in 3 days   Evelina Dun, FNP

## 2014-10-26 NOTE — Progress Notes (Signed)
   Subjective:    Patient ID: [name], male    DOB: 04-11-1966, 49 y.o.   MRN: [id number]  Cough This is a new problem. The current episode started 1 to 4 weeks ago. The problem has been gradually worsening. The problem occurs every few minutes. The cough is productive of sputum. Associated symptoms include ear congestion, ear pain, headaches, myalgias, nasal congestion, postnasal drip, a sore throat and shortness of breath. Pertinent negatives include no chills, fever, hemoptysis or rhinorrhea. The symptoms are aggravated by lying down and pollens. He has tried rest for the symptoms. The treatment provided moderate relief. There is no history of asthma or COPD.      Review of Systems  Constitutional: Negative.  Negative for fever and chills.  HENT: Positive for ear pain, postnasal drip and sore throat. Negative for rhinorrhea.   Respiratory: Positive for cough and shortness of breath. Negative for hemoptysis.   Cardiovascular: Negative.   Gastrointestinal: Negative.   Endocrine: Negative.   Genitourinary: Negative.   Musculoskeletal: Positive for myalgias.  Neurological: Positive for headaches.  Hematological: Negative.   Psychiatric/Behavioral: Negative.   All other systems reviewed and are negative.      Objective:   Physical Exam  Constitutional: He is oriented to person, place, and time. He appears well-developed and well-nourished. No distress.  HENT:  Head: Normocephalic.  Right Ear: External ear normal.  Left Ear: External ear normal.  Nasal passage erythemas with mild swelling  Oropharynx erythemas   Eyes: Pupils are equal, round, and reactive to light. Right eye exhibits no discharge. Left eye exhibits no discharge.  Neck: Normal range of motion. Neck supple. No thyromegaly present.  Cardiovascular: Normal rate, regular rhythm, normal heart sounds and intact distal pulses.   No murmur heard. Pulmonary/Chest: Effort normal and breath sounds normal. No  respiratory distress. He has no wheezes.  Abdominal: Soft. Bowel sounds are normal. He exhibits no distension. There is no tenderness.  Neurological: He is alert and oriented to person, place, and time. He has normal reflexes. No cranial nerve deficit.  Skin: Skin is warm and dry. No rash noted. No erythema.  Psychiatric: He has a normal mood and affect. His behavior is normal. Judgment and thought content normal.  Vitals reviewed.     BP 120/76 mmHg  Pulse 62  Temp(Src) 98.4 F (36.9 C) (Oral)  Ht   Wt      Assessment & Plan:  1. Acute upper respiratory infection -- Take meds as prescribed - Use a cool mist humidifier  -Use saline nose sprays frequently -Saline irrigations of the nose can be very helpful if done frequently.  * 4X daily for 1 week*  * Use of a nettie pot can be helpful with this. Follow directions with this* -Force fluids -For any cough or congestion  Use plain Mucinex- regular strength or max strength is fine   * Children- consult with Pharmacist for dosing -For fever or aces or pains- take tylenol or ibuprofen appropriate for age and weight.  * for fevers greater than 101 orally you may alternate ibuprofen and tylenol every  3 hours. -Throat lozenges if help -New toothbrush in 3 days - benzonatate (TESSALON) 200 MG capsule; Take 1 capsule (200 mg total) by mouth 3 (three) times daily as needed.  Dispense: 30 capsule; Refill: 1 - azithromycin (ZITHROMAX) 250 MG tablet; Take 500 mg once, then 250 mg for four days  Dispense: 6 tablet; Refill: 0  Evelina Dun, FNP

## 2014-10-30 ENCOUNTER — Ambulatory Visit: Payer: Medicare Other | Admitting: Neurology

## 2014-11-05 ENCOUNTER — Telehealth: Payer: Self-pay | Admitting: Pharmacist

## 2014-11-05 DIAGNOSIS — G894 Chronic pain syndrome: Secondary | ICD-10-CM

## 2014-11-05 MED ORDER — HYDROCODONE-ACETAMINOPHEN 7.5-325 MG PO TABS: 1.0000 | ORAL_TABLET | Freq: Every day | ORAL | Status: DC

## 2014-11-05 MED ORDER — FENTANYL 75 MCG/HR TD PT72: 75.0000 ug | MEDICATED_PATCH | TRANSDERMAL | Status: DC

## 2014-11-05 MED ORDER — DOXYCYCLINE HYCLATE 100 MG PO TABS: 100.0000 mg | ORAL_TABLET | Freq: Two times a day (BID) | ORAL | Status: DC

## 2014-11-05 NOTE — Telephone Encounter (Signed)
New rx of doxycycline sent to pharmacy for sore, cough, and nasal discharge

## 2014-11-05 NOTE — Telephone Encounter (Signed)
Patient is due pain medication refill.  Rx's printed.   Also c/o of continued sore throat, cough, green nasal discharge.  Was asking for another round of ABX or if he needs to come in for follow up.  Will send request to his PCP who saw him for URI on 10/26/14.

## 2014-11-06 NOTE — Telephone Encounter (Signed)
Stp advised rx sent to the pharmacy.

## 2014-11-07 ENCOUNTER — Other Ambulatory Visit: Payer: Self-pay | Admitting: Family

## 2014-11-11 DIAGNOSIS — J449 Chronic obstructive pulmonary disease, unspecified: Secondary | ICD-10-CM | POA: Diagnosis not present

## 2014-11-12 ENCOUNTER — Telehealth: Payer: Self-pay | Admitting: Family

## 2014-11-12 DIAGNOSIS — G894 Chronic pain syndrome: Secondary | ICD-10-CM

## 2014-11-12 MED ORDER — FENTANYL 75 MCG/HR TD PT72: 75.0000 ug | MEDICATED_PATCH | TRANSDERMAL | Status: DC

## 2014-11-12 MED ORDER — WARFARIN SODIUM 5 MG PO TABS: 5.0000 mg | ORAL_TABLET | Freq: Every day | ORAL | Status: DC

## 2014-11-12 MED ORDER — HYDROCODONE-ACETAMINOPHEN 7.5-325 MG PO TABS: 1.0000 | ORAL_TABLET | Freq: Every day | ORAL | Status: DC

## 2014-11-12 NOTE — Telephone Encounter (Signed)
Rx sent in for 90 days supply for warfarin. Also Rx from 11/05/14 for fentanyl and hydrocodone was not printed - rxs redone and mailed to patient.

## 2014-11-23 ENCOUNTER — Encounter: Payer: Self-pay | Admitting: Family

## 2014-11-23 ENCOUNTER — Ambulatory Visit (INDEPENDENT_AMBULATORY_CARE_PROVIDER_SITE_OTHER): Payer: Medicare Other | Admitting: Family

## 2014-11-23 VITALS — BP 120/84 | HR 61 | Temp 97.1°F

## 2014-11-23 DIAGNOSIS — K2100 Gastro-esophageal reflux disease with esophagitis, without bleeding: Secondary | ICD-10-CM

## 2014-11-23 DIAGNOSIS — I82409 Acute embolism and thrombosis of unspecified deep veins of unspecified lower extremity: Secondary | ICD-10-CM

## 2014-11-23 DIAGNOSIS — Z7901 Long term (current) use of anticoagulants: Secondary | ICD-10-CM

## 2014-11-23 DIAGNOSIS — G894 Chronic pain syndrome: Secondary | ICD-10-CM

## 2014-11-23 DIAGNOSIS — E349 Endocrine disorder, unspecified: Secondary | ICD-10-CM

## 2014-11-23 DIAGNOSIS — N400 Enlarged prostate without lower urinary tract symptoms: Secondary | ICD-10-CM

## 2014-11-23 DIAGNOSIS — E1165 Type 2 diabetes mellitus with hyperglycemia: Secondary | ICD-10-CM | POA: Diagnosis not present

## 2014-11-23 DIAGNOSIS — G4733 Obstructive sleep apnea (adult) (pediatric): Secondary | ICD-10-CM

## 2014-11-23 DIAGNOSIS — G40909 Epilepsy, unspecified, not intractable, without status epilepticus: Secondary | ICD-10-CM | POA: Diagnosis not present

## 2014-11-23 DIAGNOSIS — G43719 Chronic migraine without aura, intractable, without status migrainosus: Secondary | ICD-10-CM | POA: Diagnosis not present

## 2014-11-23 DIAGNOSIS — F411 Generalized anxiety disorder: Secondary | ICD-10-CM | POA: Diagnosis not present

## 2014-11-23 DIAGNOSIS — G4700 Insomnia, unspecified: Secondary | ICD-10-CM

## 2014-11-23 DIAGNOSIS — E291 Testicular hypofunction: Secondary | ICD-10-CM

## 2014-11-23 DIAGNOSIS — E559 Vitamin D deficiency, unspecified: Secondary | ICD-10-CM | POA: Diagnosis not present

## 2014-11-23 LAB — POCT GLYCOSYLATED HEMOGLOBIN (HGB A1C): Hemoglobin A1C: 7.8

## 2014-11-23 LAB — POCT INR: INR: 2

## 2014-11-23 MED ORDER — DIAZEPAM 5 MG PO TABS: 5.0000 mg | ORAL_TABLET | Freq: Two times a day (BID) | ORAL | Status: DC | PRN

## 2014-11-23 MED ORDER — FENTANYL 75 MCG/HR TD PT72: 75.0000 ug | MEDICATED_PATCH | TRANSDERMAL | Status: DC

## 2014-11-23 MED ORDER — ZOLPIDEM TARTRATE 5 MG PO TABS: 2.5000 mg | ORAL_TABLET | Freq: Every evening | ORAL | Status: DC | PRN

## 2014-11-23 MED ORDER — HYDROCODONE-ACETAMINOPHEN 7.5-325 MG PO TABS
1.0000 | ORAL_TABLET | Freq: Every day | ORAL | Status: DC
Start: 2014-11-23 — End: 2014-11-23

## 2014-11-23 MED ORDER — HYDROCODONE-ACETAMINOPHEN 7.5-325 MG PO TABS: 1.0000 | ORAL_TABLET | Freq: Every day | ORAL | Status: DC

## 2014-11-23 NOTE — Progress Notes (Signed)
Subjective:    Patient ID: [name], male    DOB: 1965/08/29, 49 y.o.   MRN: [id number]  Diabetes He presents for his follow-up diabetic visit. He has type 2 diabetes mellitus. His disease course has been improving. Pertinent negatives for hypoglycemia include no confusion, dizziness or nervousness/anxiousness. Associated symptoms include foot paresthesias. Pertinent negatives for diabetes include no blurred vision, no foot ulcerations and no visual change. There are no hypoglycemic complications. Symptoms are worsening. Diabetic complications include peripheral neuropathy. Pertinent negatives for diabetic complications include no CVA or heart disease. Risk factors for coronary artery disease include diabetes mellitus, dyslipidemia, hypertension, male sex, obesity and sedentary lifestyle. Current diabetic treatment includes insulin injections and oral agent (dual therapy). He is compliant with treatment all of the time. He is following a generally unhealthy diet. His breakfast blood glucose range is generally 140-180 mg/dl. Eye exam is current.  Hyperlipidemia This is a chronic problem. The current episode started more than 1 year ago. The problem is controlled. Recent lipid tests were reviewed and are normal. Exacerbating diseases include diabetes and obesity. He has no history of hypothyroidism. Factors aggravating his hyperlipidemia include fatty foods. Pertinent negatives include no leg pain or myalgias. Current antihyperlipidemic treatment includes statins. The current treatment provides moderate improvement of lipids. Risk factors for coronary artery disease include diabetes mellitus, dyslipidemia, family history, hypertension, obesity, male sex and a sedentary lifestyle.  Gastrophageal Reflux He reports no abdominal pain, no coughing, no heartburn, no nausea or no sore throat. This is a chronic problem. The current episode started more than 1 year ago. The problem occurs rarely. The problem  has been resolved. The symptoms are aggravated by certain foods. He has tried a PPI for the symptoms. The treatment provided significant relief.  Anxiety Presents for follow-up visit. Symptoms include depressed mood, excessive worry and insomnia. Patient reports no confusion, dizziness, nausea, nervous/anxious behavior or panic. Symptoms occur occasionally. The quality of sleep is good.   His past medical history is significant for depression. There is no history of anxiety/panic attacks. Past treatments include SSRIs. The treatment provided moderate relief.  Benign Prostatic Hypertrophy This is a chronic problem. The current episode started more than 1 year ago. The problem has been resolved since onset. Irritative symptoms do not include frequency, nocturia or urgency. Obstructive symptoms do not include an intermittent stream or a weak stream. Pertinent negatives include no dysuria, hematuria, nausea or vomiting. Past treatments include tamsulosin. The treatment provided significant relief.      Review of Systems  Constitutional: Negative.   HENT: Negative.  Negative for sore throat.   Eyes: Negative for blurred vision.  Respiratory: Negative.  Negative for cough.   Cardiovascular: Negative.   Gastrointestinal: Negative.  Negative for heartburn, nausea, vomiting and abdominal pain.  Endocrine: Negative.   Genitourinary: Negative.  Negative for dysuria, urgency, frequency, hematuria and nocturia.  Musculoskeletal: Negative.  Negative for myalgias.  Neurological: Negative.  Negative for dizziness.  Hematological: Negative.   Psychiatric/Behavioral: Negative for confusion. The patient has insomnia. The patient is not nervous/anxious.   All other systems reviewed and are negative.      Objective:   Physical Exam  Constitutional: He is oriented to person, place, and time. He appears well-developed and well-nourished. No distress.  HENT:  Head: Normocephalic.  Right Ear: External ear  normal.  Left Ear: External ear normal.  Nose: Nose normal.  Mouth/Throat: Oropharynx is clear and moist.  Eyes: Pupils are equal, round, and reactive to  light. Right eye exhibits no discharge. Left eye exhibits no discharge.  Neck: Normal range of motion. Neck supple. No thyromegaly present.  Cardiovascular: Normal rate, regular rhythm, normal heart sounds and intact distal pulses.   No murmur heard. Pulmonary/Chest: Effort normal and breath sounds normal. No respiratory distress. He has no wheezes.  Abdominal: Soft. Bowel sounds are normal. He exhibits no distension. There is no tenderness.  Musculoskeletal: Normal range of motion. He exhibits edema (2+ in BLE). He exhibits no tenderness.  Neurological: He is alert and oriented to person, place, and time. He has normal reflexes. No cranial nerve deficit.  Skin: Skin is warm and dry. No rash noted. No erythema.  Psychiatric: He has a normal mood and affect. His behavior is normal. Judgment and thought content normal.  Vitals reviewed.     BP 120/84 mmHg  Pulse 61  Temp(Src) 97.1 F (36.2 C) (Oral)  Ht   Wt      Assessment & Plan:  1. Long term current use of anticoagulant therapy - CMP14+EGFR  2. DVT (deep venous thrombosis), unspecified laterality - POCT INR - CMP14+EGFR  3. 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 OSA (obstructive sleep apnea) - CMP14+EGFR  5. Gastroesophageal reflux disease with esophagitis - CMP14+EGFR  6. Type 2 diabetes mellitus with hyperglycemia - POCT glycosylated hemoglobin (Hb A1C) - CMP14+EGFR  7. BPH (benign prostatic hyperplasia) - CMP14+EGFR  8. Morbid obesity - CMP14+EGFR  9. GAD (generalized anxiety disorder) - CMP14+EGFR - diazepam (VALIUM) 5 MG tablet; Take 1 tablet (5 mg total) by mouth every 12 (twelve) hours as needed for anxiety.  Dispense: 60 tablet; Refill: 3  10. Seizure disorder - CMP14+EGFR  11. Chronic pain  syndrome - CMP14+EGFR - HYDROcodone-acetaminophen (NORCO) 7.5-325 MG per tablet; Take 1 tablet by mouth daily. As needed for BTP (1 tablet per day)  Dispense: 30 tablet; Refill: 0 - fentaNYL (DURAGESIC) 75 MCG/HR; Place 1 patch (75 mcg total) onto the skin every 3 (three) days. Do not fill until 12/24/14  Dispense: 10 patch; Refill: 0  12. Deep venous thrombosis, unspecified laterality  - CMP14+EGFR  13. Vitamin D deficiency - CMP14+EGFR - Vit D  25 hydroxy (rtn osteoporosis monitoring)  14. Testosterone deficiency - CMP14+EGFR - Testosterone,Free and Total  15. Insomnia - CMP14+EGFR - zolpidem (AMBIEN) 5 MG tablet; Take 0.5 tablets (2.5 mg total) by mouth at bedtime as needed. For sleep  Dispense: 30 tablet; Refill: 3   Continue all meds Labs pending Health Maintenance reviewed Diet and exercise encouraged RTO 3 months   Evelina Dun, FNP

## 2014-11-23 NOTE — Patient Instructions (Signed)

## 2014-11-26 LAB — CMP14+EGFR
ALT: 22 IU/L (ref 0–44)
AST: 26 IU/L (ref 0–40)
Albumin/Globulin Ratio: 1.1 (ref 1.1–2.5)
Albumin: 3.8 g/dL (ref 3.5–5.5)
Alkaline Phosphatase: 91 IU/L (ref 39–117)
BUN/Creatinine Ratio: 20 (ref 9–20)
BUN: 31 mg/dL — ABNORMAL HIGH (ref 6–24)
Bilirubin Total: 0.5 mg/dL (ref 0.0–1.2)
CO2: 24 mmol/L (ref 18–29)
Calcium: 8.9 mg/dL (ref 8.7–10.2)
Chloride: 93 mmol/L — ABNORMAL LOW (ref 97–108)
Creatinine, Ser: 1.53 mg/dL — ABNORMAL HIGH (ref 0.76–1.27)
GFR calc Af Amer: 61 mL/min/{1.73_m2} (ref >59)
GFR calc non Af Amer: 53 mL/min/{1.73_m2} — ABNORMAL LOW (ref >59)
Globulin, Total: 3.4 g/dL (ref 1.5–4.5)
Glucose: 206 mg/dL — ABNORMAL HIGH (ref 65–99)
Potassium: 4.5 mmol/L (ref 3.5–5.2)
Sodium: 137 mmol/L (ref 134–144)
Total Protein: 7.2 g/dL (ref 6.0–8.5)

## 2014-11-26 LAB — TESTOSTERONE,FREE AND TOTAL
Testosterone, Free: 1.6 pg/mL — ABNORMAL LOW (ref 6.8–21.5)
Testosterone: 78 ng/dL — ABNORMAL LOW (ref 348–1197)

## 2014-11-26 LAB — VITAMIN D 25 HYDROXY (VIT D DEFICIENCY, FRACTURES): Vit D, 25-Hydroxy: 24.8 ng/mL — ABNORMAL LOW (ref 30.0–100.0)

## 2014-11-28 ENCOUNTER — Other Ambulatory Visit: Payer: Self-pay | Admitting: Family

## 2014-11-28 MED ORDER — TESTOSTERONE 50 MG/5GM (1%) TD GEL: 10.0000 g | Freq: Every day | TRANSDERMAL | Status: DC

## 2014-12-12 DIAGNOSIS — J449 Chronic obstructive pulmonary disease, unspecified: Secondary | ICD-10-CM | POA: Diagnosis not present

## 2014-12-14 ENCOUNTER — Ambulatory Visit (INDEPENDENT_AMBULATORY_CARE_PROVIDER_SITE_OTHER): Payer: Medicare Other | Admitting: Physician Assistant

## 2014-12-14 ENCOUNTER — Encounter: Payer: Self-pay | Admitting: Physician Assistant

## 2014-12-14 DIAGNOSIS — R300 Dysuria: Secondary | ICD-10-CM | POA: Diagnosis not present

## 2014-12-14 LAB — POCT URINALYSIS DIPSTICK
Bilirubin, UA: NEGATIVE
Blood, UA: NEGATIVE
Ketones, UA: NEGATIVE
Leukocytes, UA: NEGATIVE
Nitrite, UA: NEGATIVE
Protein, UA: NEGATIVE
Spec Grav, UA: 1.01
Urobilinogen, UA: NEGATIVE
pH, UA: 7

## 2014-12-14 LAB — POCT UA - MICROSCOPIC ONLY
Bacteria, U Microscopic: NEGATIVE
Casts, Ur, LPF, POC: NEGATIVE
Crystals, Ur, HPF, POC: NEGATIVE
Epithelial cells, urine per micros: NEGATIVE
Mucus, UA: NEGATIVE
Yeast, UA: NEGATIVE

## 2014-12-14 MED ORDER — SULFAMETHOXAZOLE-TRIMETHOPRIM 800-160 MG PO TABS: 1.0000 | ORAL_TABLET | Freq: Two times a day (BID) | ORAL | Status: DC

## 2014-12-14 NOTE — Patient Instructions (Signed)

## 2014-12-14 NOTE — Progress Notes (Signed)
Subjective:     Patient ID: [name], male   DOB: 1965/11/06, 49 y.o.   MRN: [id number]  HPI Pt with urine hesitancy and dysuria He also has had some abd pain and back pain Says his urine output has been lower  Review of Systems  Constitutional: Negative.   Respiratory: Negative.   Cardiovascular: Negative.   Gastrointestinal: Positive for abdominal pain.  Genitourinary: Positive for dysuria, frequency, flank pain, decreased urine volume and difficulty urinating.       Objective:   Physical Exam  Constitutional: He appears well-developed and well-nourished.  Abdominal: Soft. Bowel sounds are normal. He exhibits no distension and no mass. There is tenderness. There is no rebound and no guarding.  Pt sig obese and wheelchair bound No CVAT + suprapubic abd TTP  Genitourinary:  Unable to do prostate exam due to being wheelchair bound  Nursing note and vitals reviewed.  Results for orders placed or performed in visit on 12/14/14  POCT urinalysis dipstick  Result Value Ref Range   Color, UA yellow    Clarity, UA clear    Glucose, UA 2000+    Bilirubin, UA neg    Ketones, UA neg    Spec Grav, UA 1.010    Blood, UA neg    pH, UA 7.0    Protein, UA neg    Urobilinogen, UA negative    Nitrite, UA neg    Leukocytes, UA Negative Negative  POCT UA - Microscopic Only  Result Value Ref Range   WBC, Ur, HPF, POC occ    RBC, urine, microscopic few    Bacteria, U Microscopic neg    Mucus, UA neg    Epithelial cells, urine per micros neg    Crystals, Ur, HPF, POC neg    Casts, Ur, LPF, POC neg    Yeast, UA neg         Assessment:     Probable prostatitis    Plan:     Septra DS 1 po bid #28 Continue all of his other meds Due to anticoag therapy would like him to return in 1 week for recheck of INR Recheck urine in 2 weeks

## 2014-12-16 LAB — URINE CULTURE

## 2014-12-21 ENCOUNTER — Telehealth: Payer: Self-pay | Admitting: Pharmacist

## 2014-12-21 MED ORDER — INSULIN GLARGINE 300 UNIT/ML ~~LOC~~ SOPN: 190.0000 [IU] | PEN_INJECTOR | SUBCUTANEOUS | Status: DC

## 2014-12-21 NOTE — Telephone Encounter (Signed)
Patient states he has been injecting Lantus 95 units bid.  Current rx directions are 50 units bid.   I recommended change to Toujeo 190 units daily because at 95 units bid patient is actually injecting 4 times (pen only goes up to 80 units).  Patient aware of change and voices understaning of dose change.

## 2014-12-28 ENCOUNTER — Encounter: Payer: Self-pay | Admitting: Physician Assistant

## 2014-12-28 ENCOUNTER — Ambulatory Visit (INDEPENDENT_AMBULATORY_CARE_PROVIDER_SITE_OTHER): Payer: Medicare Other | Admitting: Physician Assistant

## 2014-12-28 ENCOUNTER — Ambulatory Visit: Payer: Self-pay

## 2014-12-28 VITALS — BP 141/87 | HR 60 | Temp 98.3°F

## 2014-12-28 DIAGNOSIS — R3911 Hesitancy of micturition: Secondary | ICD-10-CM

## 2014-12-28 DIAGNOSIS — Z7901 Long term (current) use of anticoagulants: Secondary | ICD-10-CM

## 2014-12-28 LAB — POCT UA - MICROSCOPIC ONLY
Bacteria, U Microscopic: NEGATIVE
Casts, Ur, LPF, POC: NEGATIVE
Crystals, Ur, HPF, POC: NEGATIVE
Mucus, UA: NEGATIVE
WBC, Ur, HPF, POC: NEGATIVE
Yeast, UA: NEGATIVE

## 2014-12-28 LAB — POCT URINALYSIS DIPSTICK
Bilirubin, UA: NEGATIVE
Glucose, UA: 2000
Ketones, UA: NEGATIVE
Leukocytes, UA: NEGATIVE
Nitrite, UA: NEGATIVE
Protein, UA: NEGATIVE
Spec Grav, UA: <=1.005
Urobilinogen, UA: NEGATIVE
pH, UA: 7

## 2014-12-28 LAB — POCT INR: INR: 2.7

## 2014-12-28 NOTE — Patient Instructions (Signed)
Benign Prostatic Hyperplasia An enlarged prostate (benign prostatic hyperplasia) is common in older men. You may experience the following:  Weak urine stream.  Dribbling.  Feeling like the bladder has not emptied completely.  Difficulty starting urination.  Getting up frequently at night to urinate.  Urinating more frequently during the day. HOME CARE INSTRUCTIONS  Monitor your prostatic hyperplasia for any changes. The following actions may help to alleviate any discomfort you are experiencing:  Give yourself time when you urinate.  Stay away from alcohol.  Avoid beverages containing caffeine, such as coffee, tea, and colas, because they can make the problem worse.  Avoid decongestants, antihistamines, and some prescription medicines that can make the problem worse.  Follow up with your health care provider for further treatment as recommended. SEEK MEDICAL CARE IF:  You are experiencing progressive difficulty voiding.  Your urine stream is progressively getting narrower.  You are awaking from sleep with the urge to void more frequently.  You are constantly feeling the need to void.  You experience loss of urine, especially in small amounts. SEEK IMMEDIATE MEDICAL CARE IF:   You develop increased pain with urination or are unable to urinate.  You develop severe abdominal pain, vomiting, a high fever, or fainting.  You develop back pain or blood in your urine. MAKE SURE YOU:   Understand these instructions.  Will watch your condition.  Will get help right away if you are not doing well or get worse. Document Released: 06/15/2005 Document Revised: 02/15/2013 Document Reviewed: 11/15/2012 [employer] Patient Information 2015 [employer]. This information is not intended to replace advice given to you by your health care provider. Make sure you discuss any questions you have with your health care provider.  

## 2014-12-28 NOTE — Progress Notes (Signed)
Subjective:     Patient ID: [name], male   DOB: [birth year], 49 y.o.   MRN: [id number]  HPI Pt here for f/u of urinary hesitancy and INR check Sx got better for a few days when on the Bactrim but sx then returned No fever/chills  Review of Systems  Genitourinary: Positive for frequency, decreased urine volume and difficulty urinating.       Objective:   Physical Exam  Constitutional: He appears well-developed and well-nourished.  Nursing note and vitals reviewed.  Results for orders placed or performed in visit on 12/28/14  POCT INR  Result Value Ref Range   INR 2.7   POCT urinalysis dipstick  Result Value Ref Range   Color, UA gold    Clarity, UA clear    Glucose, UA 2000    Bilirubin, UA neg    Ketones, UA neg    Spec Grav, UA <=1.005    Blood, UA trace    pH, UA 7.0    Protein, UA neg    Urobilinogen, UA negative    Nitrite, UA neg    Leukocytes, UA Negative Negative       Assessment:     1. Urinary hesitancy   2. Long-term (current) use of anticoagulants        Plan:     INR good at this time Pt has had good success with Cipro in the past so will try a 2 week course If sx continue refer back to urol Continue regular f/u of INR eval

## 2014-12-30 ENCOUNTER — Other Ambulatory Visit: Payer: Self-pay | Admitting: Family

## 2015-01-01 ENCOUNTER — Other Ambulatory Visit: Payer: Self-pay | Admitting: *Deleted

## 2015-01-01 ENCOUNTER — Telehealth: Payer: Self-pay | Admitting: Physician Assistant

## 2015-01-01 MED ORDER — CIPROFLOXACIN HCL 500 MG PO TABS: 500.0000 mg | ORAL_TABLET | Freq: Two times a day (BID) | ORAL | Status: DC

## 2015-01-01 NOTE — Telephone Encounter (Signed)
Cipro has been sent to pharmacy per OV notes

## 2015-01-03 ENCOUNTER — Telehealth: Payer: Self-pay | Admitting: Pharmacist

## 2015-01-03 MED ORDER — INSULIN GLARGINE 300 UNIT/ML ~~LOC~~ SOPN: 190.0000 [IU] | PEN_INJECTOR | SUBCUTANEOUS | Status: DC

## 2015-01-03 NOTE — Telephone Encounter (Signed)
Patient only received 1 box of Toujeo pens.  Rx sent to [facility] for 30 days supply = about 18.68ml - round up to nearest box amount = 22.41ml.

## 2015-01-11 DIAGNOSIS — J449 Chronic obstructive pulmonary disease, unspecified: Secondary | ICD-10-CM | POA: Diagnosis not present

## 2015-01-14 ENCOUNTER — Encounter: Payer: Self-pay | Admitting: *Deleted

## 2015-01-23 ENCOUNTER — Telehealth: Payer: Self-pay | Admitting: Family

## 2015-01-23 DIAGNOSIS — G894 Chronic pain syndrome: Secondary | ICD-10-CM

## 2015-01-23 MED ORDER — FENTANYL 75 MCG/HR TD PT72: 75.0000 ug | MEDICATED_PATCH | TRANSDERMAL | Status: DC

## 2015-01-23 MED ORDER — HYDROCODONE-ACETAMINOPHEN 7.5-325 MG PO TABS: 1.0000 | ORAL_TABLET | Freq: Every day | ORAL | Status: DC

## 2015-01-23 MED ORDER — INSULIN LISPRO 100 UNIT/ML (KWIKPEN): 35.0000 [IU] | PEN_INJECTOR | Freq: Three times a day (TID) | SUBCUTANEOUS | Status: DC

## 2015-01-23 NOTE — Telephone Encounter (Signed)
Please mail to pt

## 2015-01-23 NOTE — Telephone Encounter (Signed)
Aware, scripts for fentanyl patch and hydrocodone ready.

## 2015-02-05 ENCOUNTER — Other Ambulatory Visit: Payer: Self-pay | Admitting: Family

## 2015-02-09 ENCOUNTER — Other Ambulatory Visit: Payer: Self-pay | Admitting: Family

## 2015-02-11 DIAGNOSIS — J449 Chronic obstructive pulmonary disease, unspecified: Secondary | ICD-10-CM | POA: Diagnosis not present

## 2015-02-15 ENCOUNTER — Ambulatory Visit (INDEPENDENT_AMBULATORY_CARE_PROVIDER_SITE_OTHER): Payer: Medicare Other | Admitting: Pharmacist

## 2015-02-15 ENCOUNTER — Encounter: Payer: Self-pay | Admitting: Pharmacist

## 2015-02-15 ENCOUNTER — Ambulatory Visit (INDEPENDENT_AMBULATORY_CARE_PROVIDER_SITE_OTHER): Payer: Medicare Other

## 2015-02-15 VITALS — BP 132/62 | HR 76

## 2015-02-15 DIAGNOSIS — Z7901 Long term (current) use of anticoagulants: Secondary | ICD-10-CM | POA: Diagnosis not present

## 2015-02-15 DIAGNOSIS — J810 Acute pulmonary edema: Secondary | ICD-10-CM

## 2015-02-15 DIAGNOSIS — M25571 Pain in right ankle and joints of right foot: Secondary | ICD-10-CM

## 2015-02-15 DIAGNOSIS — I82409 Acute embolism and thrombosis of unspecified deep veins of unspecified lower extremity: Secondary | ICD-10-CM

## 2015-02-15 DIAGNOSIS — Z0000 Encounter for general adult medical examination without abnormal findings: Secondary | ICD-10-CM | POA: Diagnosis not present

## 2015-02-15 LAB — POCT INR: INR: 1.9

## 2015-02-15 MED ORDER — CANAGLIFLOZIN 100 MG PO TABS: 100.0000 mg | ORAL_TABLET | Freq: Every day | ORAL | Status: DC

## 2015-02-15 NOTE — Patient Instructions (Addendum)
[name] , Thank you for taking time to come for your Medicare Wellness Visit. I appreciate your ongoing commitment to your health goals. Please review the following plan we discussed and let me know if I can assist you in the future.     This is a list of the screening recommended for you and due dates:  Health Maintenance  Topic Date Due  . Flu Shot  01/28/2015  . HIV Screening  11/22/2025*  . Eye exam for diabetics  04/30/2015  . Hemoglobin A1C  05/26/2015  . Urine Protein Check  08/17/2015  . Complete foot exam   02/15/2016  . Pneumococcal vaccine (2) 09/29/2018  . Tetanus Vaccine  09/29/2023  *Topic was postponed. The date shown is not the original due date.    Anticoagulation Dose Instructions as of 02/15/2015      Dorene Grebe Tue Wed Thu Fri Sat   New Dose 5 mg 5 mg 7.5 mg 5 mg 5 mg 5 mg 7.5 mg    Description        Take 1 and 1/2 tablets today - Friday, August 19th, then continue usual warfarin dose of 1 and 1/2 tablets on tuesdays and saturdays and 1 tablet all other days.      INR was 1.8 today  Fall Prevention and Home Safety Falls cause injuries and can affect all age groups. It is possible to use preventive measures to significantly decrease the likelihood of falls. There are many simple measures which can make your home safer and prevent falls. OUTDOORS  Repair cracks and edges of walkways and driveways.  Remove high doorway thresholds.  Trim shrubbery on the main path into your home.  Have good outside lighting.  Clear walkways of tools, rocks, debris, and clutter.  Check that handrails are not broken and are securely fastened. Both sides of steps should have handrails.  Have leaves, snow, and ice cleared regularly.  Use sand or salt on walkways during winter months.  In the garage, clean up grease or oil spills. BATHROOM  Install night lights.  Install grab bars by the toilet and in the tub and shower.  Use non-skid mats or decals in the tub or  shower.  Place a plastic non-slip stool in the shower to sit on, if needed.  Keep floors dry and clean up all water on the floor immediately.  Remove soap buildup in the tub or shower on a regular basis.  Secure bath mats with non-slip, double-sided rug tape.  Remove throw rugs and tripping hazards from the floors. BEDROOMS  Install night lights.  Make sure a bedside light is easy to reach.  Do not use oversized bedding.  Keep a telephone by your bedside.  Have a firm chair with side arms to use for getting dressed.  Remove throw rugs and tripping hazards from the floor. KITCHEN  Keep handles on pots and pans turned toward the center of the stove. Use back burners when possible.  Clean up spills quickly and allow time for drying.  Avoid walking on wet floors.  Avoid hot utensils and knives.  Position shelves so they are not too high or low.  Place commonly used objects within easy reach.  If necessary, use a sturdy step stool with a grab bar when reaching.  Keep electrical cables out of the way.  Do not use floor polish or wax that makes floors slippery. If you must use wax, use non-skid floor wax.  Remove throw rugs and tripping  hazards from the floor. STAIRWAYS  Never leave objects on stairs.  Place handrails on both sides of stairways and use them. Fix any loose handrails. Make sure handrails on both sides of the stairways are as long as the stairs.  Check carpeting to make sure it is firmly attached along stairs. Make repairs to worn or loose carpet promptly.  Avoid placing throw rugs at the top or bottom of stairways, or properly secure the rug with carpet tape to prevent slippage. Get rid of throw rugs, if possible.  Have an electrician put in a light switch at the top and bottom of the stairs. OTHER FALL PREVENTION TIPS  Wear low-heel or rubber-soled shoes that are supportive and fit well. Wear closed toe shoes.  When using a stepladder, make sure it  is fully opened and both spreaders are firmly locked. Do not climb a closed stepladder.  Add color or contrast paint or tape to grab bars and handrails in your home. Place contrasting color strips on first and last steps.  Learn and use mobility aids as needed. Install an electrical emergency response system.  Turn on lights to avoid dark areas. Replace light bulbs that burn out immediately. Get light switches that glow.  Arrange furniture to create clear pathways. Keep furniture in the same place.  Firmly attach carpet with non-skid or double-sided tape.  Eliminate uneven floor surfaces.  Select a carpet pattern that does not visually hide the edge of steps.  Be aware of all pets. OTHER HOME SAFETY TIPS  Set the water temperature for 120 F (48.8 C).  Keep emergency numbers on or near the telephone.  Keep smoke detectors on every level of the home and near sleeping areas. Document Released: 06/05/2002 Document Revised: 12/15/2011 Document Reviewed: 09/04/2011 [facility] Patient Information 2015 [place], [place]. This information is not intended to replace advice given to you by your health care provider. Make sure you discuss any questions you have with your health care provider.
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
## 2015-02-22 ENCOUNTER — Ambulatory Visit (INDEPENDENT_AMBULATORY_CARE_PROVIDER_SITE_OTHER): Payer: Medicare Other | Admitting: Family

## 2015-02-22 ENCOUNTER — Encounter: Payer: Self-pay | Admitting: Family

## 2015-02-22 VITALS — BP 136/83 | HR 57 | Temp 97.6°F | Ht 72.0 in

## 2015-02-22 DIAGNOSIS — R369 Urethral discharge, unspecified: Secondary | ICD-10-CM

## 2015-02-22 DIAGNOSIS — E785 Hyperlipidemia, unspecified: Secondary | ICD-10-CM

## 2015-02-22 DIAGNOSIS — F411 Generalized anxiety disorder: Secondary | ICD-10-CM

## 2015-02-22 DIAGNOSIS — I82409 Acute embolism and thrombosis of unspecified deep veins of unspecified lower extremity: Secondary | ICD-10-CM

## 2015-02-22 DIAGNOSIS — D649 Anemia, unspecified: Secondary | ICD-10-CM

## 2015-02-22 DIAGNOSIS — Z114 Encounter for screening for human immunodeficiency virus [HIV]: Secondary | ICD-10-CM

## 2015-02-22 DIAGNOSIS — G894 Chronic pain syndrome: Secondary | ICD-10-CM

## 2015-02-22 DIAGNOSIS — G4733 Obstructive sleep apnea (adult) (pediatric): Secondary | ICD-10-CM

## 2015-02-22 DIAGNOSIS — R300 Dysuria: Secondary | ICD-10-CM | POA: Diagnosis not present

## 2015-02-22 DIAGNOSIS — K2100 Gastro-esophageal reflux disease with esophagitis, without bleeding: Secondary | ICD-10-CM

## 2015-02-22 DIAGNOSIS — E349 Endocrine disorder, unspecified: Secondary | ICD-10-CM

## 2015-02-22 DIAGNOSIS — E559 Vitamin D deficiency, unspecified: Secondary | ICD-10-CM | POA: Diagnosis not present

## 2015-02-22 DIAGNOSIS — G40909 Epilepsy, unspecified, not intractable, without status epilepticus: Secondary | ICD-10-CM

## 2015-02-22 DIAGNOSIS — E662 Morbid (severe) obesity with alveolar hypoventilation: Secondary | ICD-10-CM

## 2015-02-22 DIAGNOSIS — E291 Testicular hypofunction: Secondary | ICD-10-CM | POA: Diagnosis not present

## 2015-02-22 DIAGNOSIS — E1165 Type 2 diabetes mellitus with hyperglycemia: Secondary | ICD-10-CM

## 2015-02-22 DIAGNOSIS — N400 Enlarged prostate without lower urinary tract symptoms: Secondary | ICD-10-CM | POA: Diagnosis not present

## 2015-02-22 DIAGNOSIS — J810 Acute pulmonary edema: Secondary | ICD-10-CM | POA: Diagnosis not present

## 2015-02-22 DIAGNOSIS — M25571 Pain in right ankle and joints of right foot: Secondary | ICD-10-CM

## 2015-02-22 DIAGNOSIS — D696 Thrombocytopenia, unspecified: Secondary | ICD-10-CM

## 2015-02-22 DIAGNOSIS — G43719 Chronic migraine without aura, intractable, without status migrainosus: Secondary | ICD-10-CM

## 2015-02-22 LAB — POCT WET PREP (WET MOUNT)

## 2015-02-22 LAB — POCT URINALYSIS DIPSTICK
Bilirubin, UA: NEGATIVE
Blood, UA: NEGATIVE
Glucose, UA: 250
Ketones, UA: NEGATIVE
Leukocytes, UA: NEGATIVE
Nitrite, UA: NEGATIVE
Protein, UA: NEGATIVE
Spec Grav, UA: 1.01
Urobilinogen, UA: NEGATIVE
pH, UA: 6.5

## 2015-02-22 LAB — POCT UA - MICROSCOPIC ONLY
Bacteria, U Microscopic: NEGATIVE
Casts, Ur, LPF, POC: NEGATIVE
Crystals, Ur, HPF, POC: NEGATIVE
Mucus, UA: NEGATIVE
RBC, urine, microscopic: NEGATIVE
Yeast, UA: NEGATIVE

## 2015-02-22 LAB — POCT GLYCOSYLATED HEMOGLOBIN (HGB A1C): Hemoglobin A1C: 8.1

## 2015-02-22 MED ORDER — HYDROCODONE-ACETAMINOPHEN 7.5-325 MG PO TABS: 1.0000 | ORAL_TABLET | Freq: Every day | ORAL | Status: DC

## 2015-02-22 MED ORDER — FLUCONAZOLE 150 MG PO TABS: 150.0000 mg | ORAL_TABLET | ORAL | Status: DC

## 2015-02-22 MED ORDER — FENTANYL 75 MCG/HR TD PT72: 75.0000 ug | MEDICATED_PATCH | TRANSDERMAL | Status: DC

## 2015-02-22 NOTE — Patient Instructions (Signed)

## 2015-02-22 NOTE — Progress Notes (Signed)
Subjective:    Patient ID: [name], male    DOB: 24-Mar-1966, 49 y.o.   MRN: [id number]  Pt presents to the office today for chronic follow up.  Diabetes He presents for his follow-up diabetic visit. He has type 2 diabetes mellitus. His disease course has been improving. Hypoglycemia symptoms include seizures. Pertinent negatives for hypoglycemia include no confusion, dizziness or nervousness/anxiousness. Associated symptoms include foot paresthesias. Pertinent negatives for diabetes include no blurred vision, no foot ulcerations and no visual change. There are no hypoglycemic complications. Symptoms are worsening. Diabetic complications include peripheral neuropathy. Pertinent negatives for diabetic complications include no CVA or heart disease. Risk factors for coronary artery disease include diabetes mellitus, dyslipidemia, hypertension, male sex, obesity and sedentary lifestyle. Current diabetic treatment includes insulin injections and oral agent (dual therapy). He is compliant with treatment all of the time. He is following a generally unhealthy diet. His breakfast blood glucose range is generally 130-140 mg/dl. Eye exam is current.  Hyperlipidemia This is a chronic problem. The current episode started more than 1 year ago. The problem is controlled. Recent lipid tests were reviewed and are normal. Exacerbating diseases include diabetes and obesity. He has no history of hypothyroidism. Factors aggravating his hyperlipidemia include fatty foods. Pertinent negatives include no leg pain or myalgias. Current antihyperlipidemic treatment includes statins. The current treatment provides moderate improvement of lipids. Risk factors for coronary artery disease include diabetes mellitus, dyslipidemia, family history, hypertension, obesity, male sex and a sedentary lifestyle.  Gastrophageal Reflux He reports no abdominal pain, no coughing, no heartburn, no nausea or no sore throat. This is a chronic  problem. The current episode started more than 1 year ago. The problem occurs rarely. The problem has been resolved. The symptoms are aggravated by certain foods. He has tried a PPI for the symptoms. The treatment provided significant relief.  Anxiety Presents for follow-up visit. Symptoms include depressed mood, excessive worry and insomnia. Patient reports no confusion, dizziness, nausea, nervous/anxious behavior or panic. Symptoms occur occasionally. The quality of sleep is good.   His past medical history is significant for depression. There is no history of anxiety/panic attacks. Past treatments include SSRIs. The treatment provided moderate relief.  Benign Prostatic Hypertrophy This is a chronic problem. The current episode started more than 1 year ago. The problem has been resolved since onset. Irritative symptoms do not include frequency, nocturia or urgency. Obstructive symptoms do not include an intermittent stream or a weak stream. Pertinent negatives include no dysuria, hematuria, nausea or vomiting. Past treatments include tamsulosin. The treatment provided significant relief.  Seizures  This is a chronic problem. The current episode started more than 1 week ago. Progression since onset: Pt states he usually has seziures at night, and has them every few weeks-Pt is followed by neurologists every few months. Pertinent negatives include no confusion, no sore throat, no cough, no nausea and no vomiting.  Migraine  This is a chronic (pt is followed by nuerologists every few month- Pt was getting lidocaine injections) problem. The current episode started more than 1 year ago. The problem occurs intermittently. The problem has been waxing and waning. The pain does not radiate. The pain quality is similar to prior headaches. The quality of the pain is described as aching. The pain is at a severity of 9/10. The pain is moderate. Associated symptoms include insomnia, photophobia and seizures. Pertinent  negatives include no abdominal pain, blurred vision, coughing, dizziness, nausea, sore throat, visual change or vomiting. His past  medical history is significant for obesity.  Ankle Pain  The incident occurred more than 1 week ago. Incident location: pool. The injury mechanism was a fall and a twisting injury. The pain is present in the right ankle. The pain is at a severity of 7/10. The pain is moderate. The symptoms are aggravated by movement and weight bearing. He has tried ice, non-weight bearing and NSAIDs for the symptoms. The treatment provided mild relief.  Penile Discharge The patient's primary symptoms include genital itching, penile discharge and penile pain. This is a new problem. The current episode started 1 to 4 weeks ago. The problem occurs intermittently. The problem has been unchanged. The pain is mild. Pertinent negatives include no abdominal pain, coughing, dysuria, frequency, nausea, sore throat, urgency or vomiting. He has tried OTC analgesics and rest for the symptoms. The treatment provided mild relief. His past medical history is significant for BPH.      Review of Systems  Constitutional: Negative.   HENT: Negative.  Negative for sore throat.   Eyes: Positive for photophobia. Negative for blurred vision.  Respiratory: Negative.  Negative for cough.   Cardiovascular: Negative.   Gastrointestinal: Negative.  Negative for heartburn, nausea, vomiting and abdominal pain.  Endocrine: Negative.   Genitourinary: Positive for discharge and penile pain. Negative for dysuria, urgency, frequency, hematuria and nocturia.  Musculoskeletal: Negative.  Negative for myalgias.  Neurological: Positive for seizures. Negative for dizziness.  Hematological: Negative.   Psychiatric/Behavioral: Negative for confusion. The patient has insomnia. The patient is not nervous/anxious.   All other systems reviewed and are negative.      Objective:   Physical Exam  Constitutional: He is  oriented to person, place, and time. He appears well-developed and well-nourished. No distress.  Pt morbid obese   HENT:  Head: Normocephalic.  Mouth/Throat: Oropharynx is clear and moist.  Eyes: Pupils are equal, round, and reactive to light. Right eye exhibits no discharge. Left eye exhibits no discharge.  Neck: Normal range of motion. Neck supple. No thyromegaly present.  Cardiovascular: Normal rate, regular rhythm, normal heart sounds and intact distal pulses.   No murmur heard. Pulmonary/Chest: Effort normal and breath sounds normal. No respiratory distress. He has no wheezes.  Abdominal: Soft. Bowel sounds are normal. He exhibits no distension. There is no tenderness.  Musculoskeletal: Normal range of motion. He exhibits edema (trace amt in BLE). He exhibits no tenderness.  Pt in motorized wheelchair  Neurological: He is alert and oriented to person, place, and time. He has normal reflexes. No cranial nerve deficit.  Skin: Skin is warm and dry. No rash noted. No erythema.  Psychiatric: He has a normal mood and affect. His behavior is normal. Judgment and thought content normal.  Vitals reviewed.     BP 136/83 mmHg  Pulse 57  Temp(Src) 97.6 F (36.4 C) (Oral)  Ht 6' (1.829 m)  Wt      Assessment & Plan:  1. DVT (deep venous thrombosis), unspecified laterality - CMP14+EGFR  2. 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 OSA (obstructive sleep apnea) - CMP14+EGFR  4. Postoperative pulmonary edema - CMP14+EGFR  5. Gastroesophageal reflux disease with esophagitis  - CMP14+EGFR  6. Type 2 diabetes mellitus with hyperglycemia  - POCT glycosylated hemoglobin (Hb A1C) - CMP14+EGFR  7. Seizure disorder  - CMP14+EGFR  8. BPH (benign prostatic hyperplasia)  - CMP14+EGFR - PSA, total and free  9. Morbid obesity  - CMP14+EGFR  10. Testosterone deficiency -  CMP14+EGFR - Testosterone,Free and Total  11. Vitamin D  deficiency  - CMP14+EGFR  12. GAD (generalized anxiety disorder)  - CMP14+EGFR  13. Thrombocytopenia  - CMP14+EGFR  14. Anemia, unspecified anemia type  - CMP14+EGFR  15. Obesity hypoventilation syndrome - CMP14+EGFR  16. Chronic pain syndrome  - CMP14+EGFR - HYDROcodone-acetaminophen (NORCO) 7.5-325 MG per tablet; Take 1 tablet by mouth daily. As needed for BTP (1 tablet per day)  Dispense: 30 tablet; Refill: 0 - fentaNYL (DURAGESIC) 75 MCG/HR; Place 1 patch (75 mcg total) onto the skin every 3 (three) days. Do not fill until 01/23/15  Dispense: 10 patch; Refill: 0  17. Screening for HIV (human immunodeficiency virus) - CMP14+EGFR - HIV antibody  18. Hyperlipidemia - CMP14+EGFR - Lipid panel  19. Right ankle pain - CMP14+EGFR - MR Ankle Right W Contrast; Future  20. Penile discharge  - POCT UA - Microscopic Only - POCT urinalysis dipstick - POCT Wet Prep ([place])  21. Dysuria - POCT UA - Microscopic Only - POCT urinalysis dipstick - POCT Wet Prep ([place])   Continue all meds Labs pending Health Maintenance reviewed Diet and exercise encouraged RTO 3 months   Evelina Dun, FNP

## 2015-02-23 LAB — CMP14+EGFR
ALT: 20 IU/L (ref 0–44)
AST: 29 IU/L (ref 0–40)
Albumin/Globulin Ratio: 1.2 (ref 1.1–2.5)
Albumin: 3.9 g/dL (ref 3.5–5.5)
Alkaline Phosphatase: 90 IU/L (ref 39–117)
BUN/Creatinine Ratio: 15 (ref 9–20)
BUN: 24 mg/dL (ref 6–24)
Bilirubin Total: 0.6 mg/dL (ref 0.0–1.2)
CO2: 29 mmol/L (ref 18–29)
Calcium: 8.5 mg/dL — ABNORMAL LOW (ref 8.7–10.2)
Chloride: 94 mmol/L — ABNORMAL LOW (ref 97–108)
Creatinine, Ser: 1.62 mg/dL — ABNORMAL HIGH (ref 0.76–1.27)
GFR calc Af Amer: 57 mL/min/{1.73_m2} — ABNORMAL LOW (ref >59)
GFR calc non Af Amer: 49 mL/min/{1.73_m2} — ABNORMAL LOW (ref >59)
Globulin, Total: 3.2 g/dL (ref 1.5–4.5)
Glucose: 172 mg/dL — ABNORMAL HIGH (ref 65–99)
Potassium: 4.5 mmol/L (ref 3.5–5.2)
Sodium: 138 mmol/L (ref 134–144)
Total Protein: 7.1 g/dL (ref 6.0–8.5)

## 2015-02-23 LAB — TESTOSTERONE,FREE AND TOTAL
Testosterone, Free: 2 pg/mL — ABNORMAL LOW (ref 6.8–21.5)
Testosterone: 100 ng/dL — ABNORMAL LOW (ref 348–1197)

## 2015-02-23 LAB — LIPID PANEL
Chol/HDL Ratio: 3.4 ratio units (ref 0.0–5.0)
Cholesterol, Total: 106 mg/dL (ref 100–199)
HDL: 31 mg/dL — ABNORMAL LOW (ref >39)
LDL Calculated: 40 mg/dL (ref 0–99)
Triglycerides: 175 mg/dL — ABNORMAL HIGH (ref 0–149)
VLDL Cholesterol Cal: 35 mg/dL (ref 5–40)

## 2015-02-23 LAB — HIV ANTIBODY (ROUTINE TESTING W REFLEX): HIV Screen 4th Generation wRfx: NONREACTIVE

## 2015-02-23 LAB — PSA, TOTAL AND FREE
PSA, Free Pct: >30 %
PSA, Free: 0.03 ng/mL
Prostate Specific Ag, Serum: <0.1 ng/mL (ref 0.0–4.0)

## 2015-02-25 ENCOUNTER — Ambulatory Visit: Payer: Medicare Other | Admitting: Family

## 2015-02-26 ENCOUNTER — Ambulatory Visit: Payer: Medicare Other | Admitting: Family

## 2015-02-26 ENCOUNTER — Other Ambulatory Visit: Payer: Self-pay | Admitting: *Deleted

## 2015-02-26 NOTE — Progress Notes (Unsigned)
Please call to discuss recent lab results

## 2015-02-28 ENCOUNTER — Telehealth: Payer: Self-pay | Admitting: Family

## 2015-03-01 ENCOUNTER — Ambulatory Visit: Payer: Medicare Other | Admitting: Family

## 2015-03-08 ENCOUNTER — Ambulatory Visit
Admission: RE | Admit: 2015-03-08 | Discharge: 2015-03-08 | Disposition: A | Discharge status: Home / Self Care | Payer: Medicare Other | Source: Ambulatory Visit | Attending: Family | Admitting: Family

## 2015-03-08 DIAGNOSIS — S43491A Other sprain of right shoulder joint, initial encounter: Secondary | ICD-10-CM | POA: Diagnosis not present

## 2015-03-08 DIAGNOSIS — M25571 Pain in right ankle and joints of right foot: Secondary | ICD-10-CM

## 2015-03-14 DIAGNOSIS — J449 Chronic obstructive pulmonary disease, unspecified: Secondary | ICD-10-CM | POA: Diagnosis not present

## 2015-04-05 ENCOUNTER — Ambulatory Visit (INDEPENDENT_AMBULATORY_CARE_PROVIDER_SITE_OTHER): Payer: Medicare Other | Admitting: Pharmacist

## 2015-04-05 DIAGNOSIS — G894 Chronic pain syndrome: Secondary | ICD-10-CM

## 2015-04-05 DIAGNOSIS — I82409 Acute embolism and thrombosis of unspecified deep veins of unspecified lower extremity: Secondary | ICD-10-CM | POA: Diagnosis not present

## 2015-04-05 DIAGNOSIS — Z7901 Long term (current) use of anticoagulants: Secondary | ICD-10-CM | POA: Diagnosis not present

## 2015-04-05 DIAGNOSIS — J810 Acute pulmonary edema: Secondary | ICD-10-CM

## 2015-04-05 LAB — POCT INR: INR: 2.1

## 2015-04-05 MED ORDER — FENTANYL 75 MCG/HR TD PT72: 75.0000 ug | MEDICATED_PATCH | TRANSDERMAL | Status: DC

## 2015-04-05 MED ORDER — HYDROCODONE-ACETAMINOPHEN 7.5-325 MG PO TABS: 1.0000 | ORAL_TABLET | Freq: Every day | ORAL | Status: DC

## 2015-04-05 NOTE — Patient Instructions (Signed)
Anticoagulation Dose Instructions as of 04/05/2015      [name] Tue Wed Thu Fri Sat   New Dose 5 mg 5 mg 7.5 mg 5 mg 5 mg 5 mg 7.5 mg    Description        Continue usual warfarin dose of 1 and 1/2 tablets on tuesdays and saturdays and 1 tablet all other days.      INR was 2.1 today

## 2015-04-10 ENCOUNTER — Other Ambulatory Visit: Payer: Self-pay | Admitting: Family Medicine

## 2015-04-11 ENCOUNTER — Telehealth: Payer: Self-pay | Admitting: *Deleted

## 2015-04-11 NOTE — Telephone Encounter (Signed)
Closed encounter °

## 2015-04-12 ENCOUNTER — Other Ambulatory Visit: Payer: Self-pay | Admitting: *Deleted

## 2015-04-12 DIAGNOSIS — F329 Major depressive disorder, single episode, unspecified: Secondary | ICD-10-CM

## 2015-04-12 DIAGNOSIS — F32A Depression, unspecified: Secondary | ICD-10-CM

## 2015-04-12 MED ORDER — BUSPIRONE HCL 15 MG PO TABS: 15.0000 mg | ORAL_TABLET | Freq: Two times a day (BID) | ORAL | Status: DC

## 2015-04-13 DIAGNOSIS — J449 Chronic obstructive pulmonary disease, unspecified: Secondary | ICD-10-CM | POA: Diagnosis not present

## 2015-04-25 ENCOUNTER — Other Ambulatory Visit: Payer: Self-pay | Admitting: Pharmacist

## 2015-04-26 ENCOUNTER — Other Ambulatory Visit: Payer: Self-pay | Admitting: Family

## 2015-04-26 MED ORDER — INSULIN GLARGINE 300 UNIT/ML ~~LOC~~ SOPN: 190.0000 [IU] | PEN_INJECTOR | Freq: Every morning | SUBCUTANEOUS | Status: DC

## 2015-04-26 NOTE — Telephone Encounter (Signed)
Refill Sent to pharmacy

## 2015-04-27 ENCOUNTER — Other Ambulatory Visit: Payer: Self-pay | Admitting: Pharmacist

## 2015-04-29 ENCOUNTER — Other Ambulatory Visit: Payer: Self-pay | Admitting: Family

## 2015-04-30 ENCOUNTER — Other Ambulatory Visit: Payer: Self-pay | Admitting: Family Medicine

## 2015-04-30 ENCOUNTER — Telehealth: Payer: Self-pay | Admitting: Neurology

## 2015-04-30 NOTE — Telephone Encounter (Signed)
Spouse [name] called to request [place] for husband. Please call [phone] (will be in class from 1:30-3pm).

## 2015-05-01 NOTE — Telephone Encounter (Signed)
Sure, we can do it tomorrow or Friday morning

## 2015-05-01 NOTE — Telephone Encounter (Signed)
Called pt wife back and scheduled for 11/4 at 930am check in at [facility] for procedure. She verbalized understanding.

## 2015-05-02 ENCOUNTER — Other Ambulatory Visit: Payer: Self-pay | Admitting: Family

## 2015-05-03 ENCOUNTER — Ambulatory Visit (INDEPENDENT_AMBULATORY_CARE_PROVIDER_SITE_OTHER): Payer: Medicare Other | Admitting: Neurology

## 2015-05-03 ENCOUNTER — Telehealth: Payer: Self-pay | Admitting: Pharmacist

## 2015-05-03 ENCOUNTER — Encounter: Payer: Self-pay | Admitting: Neurology

## 2015-05-03 ENCOUNTER — Ambulatory Visit: Payer: Self-pay | Admitting: Neurology

## 2015-05-03 VITALS — BP 135/83 | HR 58 | Wt >= 6400 oz

## 2015-05-03 DIAGNOSIS — G43719 Chronic migraine without aura, intractable, without status migrainosus: Secondary | ICD-10-CM

## 2015-05-03 DIAGNOSIS — G894 Chronic pain syndrome: Secondary | ICD-10-CM

## 2015-05-03 DIAGNOSIS — G441 Vascular headache, not elsewhere classified: Secondary | ICD-10-CM

## 2015-05-03 MED ORDER — LEVETIRACETAM 1000 MG PO TABS: 1000.0000 mg | ORAL_TABLET | Freq: Two times a day (BID) | ORAL | Status: DC

## 2015-05-03 NOTE — Progress Notes (Signed)
[facility] PROCEDURE NOTE  Update:  Headaches did very well after a series of Sphenocath procedures in March and April this year. However, over the past month headaches are returning   Headache history: He is taking maxalt for the headaches acutely. His eyes start hurting. It'll progress into a headache.  Topiramate (past) and Keppra (on for seizures also) have not helped the headaches much.  He also is on the fentanyl patch  Lyrica and Flexeril.He has light sensitivity, sound sensitivy but not nausea or vomiting. Eye pressure 10/10 severe pain. Vision blurs with the headaches. He has obesity hypoventilation syndrome and unclear if he is using his oxygen every night. Headaches come every several days to daily and last up to 24 hours. "20/10" pain. MRI/MRV unremarkable. LP with normal opening pressure. Opthalmology exam wnl. At least 20 days a month of headache.  Procedure: The patient was placed in the supine position. A temperature strip was added to the cheek area after the area was cleaned with alcohol. The Sphenocath was lubricated with gel, and placed in the right naris. The catheter was inserted above the middle turbinate to the posterior nasal cavity, and then withdrawn 1 cm. The catheter was deployed and rotated approximately 20 towards the nose. 2-1/2 mL of 2% lidocaine was deployed. The patient was asked to swallow during the injection. The patient demonstrated erythema of the sclera of the eye on this side, and an increase in the cheek temperature was noted from 32F to 98 F.  This process was repeated on the left side, with similar results. The increase in cheek temperature was documented from 40 F to 57 F.  The patient tolerated the procedure well. No complications of the procedure were noted. The patient was kept in the supine position for 5 minutes following the procedure.   Lidocaine 2% NDC [id number]  Expiration date: 11/19 Lot [id number]   Finnley [place] A. Felecia Shelling, MD,  PhD Certified in Neurology, [place] Neurophys[facility], Sleep Medicine, Pain Medicine and Neuroimaging  [facility] [address] [place],  [address] [phone]

## 2015-05-06 ENCOUNTER — Other Ambulatory Visit: Payer: Self-pay | Admitting: Physician Assistant

## 2015-05-06 ENCOUNTER — Other Ambulatory Visit: Payer: Self-pay | Admitting: Pharmacist

## 2015-05-06 MED ORDER — FENTANYL 75 MCG/HR TD PT72: 75.0000 ug | MEDICATED_PATCH | TRANSDERMAL | Status: DC

## 2015-05-06 MED ORDER — HYDROCODONE-ACETAMINOPHEN 7.5-325 MG PO TABS: 1.0000 | ORAL_TABLET | Freq: Every day | ORAL | Status: DC

## 2015-05-06 NOTE — Telephone Encounter (Signed)
Rx printed.  Left message to let know Rx ready for pick up

## 2015-05-14 DIAGNOSIS — J449 Chronic obstructive pulmonary disease, unspecified: Secondary | ICD-10-CM | POA: Diagnosis not present

## 2015-05-17 ENCOUNTER — Ambulatory Visit (INDEPENDENT_AMBULATORY_CARE_PROVIDER_SITE_OTHER): Payer: Medicare Other | Admitting: Pharmacist

## 2015-05-17 DIAGNOSIS — I82409 Acute embolism and thrombosis of unspecified deep veins of unspecified lower extremity: Secondary | ICD-10-CM

## 2015-05-17 DIAGNOSIS — Z23 Encounter for immunization: Secondary | ICD-10-CM

## 2015-05-17 DIAGNOSIS — J810 Acute pulmonary edema: Secondary | ICD-10-CM

## 2015-05-17 DIAGNOSIS — Z7901 Long term (current) use of anticoagulants: Secondary | ICD-10-CM

## 2015-05-17 LAB — POCT INR: INR: 1.9

## 2015-05-17 NOTE — Patient Instructions (Signed)
Anticoagulation Dose Instructions as of 05/17/2015      [name] Tue Wed Thu Fri Sat   New Dose 5 mg 5 mg 7.5 mg 5 mg 5 mg 5 mg 7.5 mg    Description        Take 1 1/2 tablets tonight - Friday Nov. 18, 2016. Continue usual warfarin dose of 1 and 1/2 tablets on Tuesdays and Saturdays and 1 tablet all other days.       INR today was 1.9

## 2015-05-26 ENCOUNTER — Other Ambulatory Visit: Payer: Self-pay | Admitting: Family

## 2015-05-31 ENCOUNTER — Ambulatory Visit (INDEPENDENT_AMBULATORY_CARE_PROVIDER_SITE_OTHER): Payer: Medicare Other | Admitting: Family

## 2015-05-31 ENCOUNTER — Encounter: Payer: Self-pay | Admitting: Family

## 2015-05-31 VITALS — BP 134/86 | HR 59 | Temp 97.5°F

## 2015-05-31 DIAGNOSIS — E662 Morbid (severe) obesity with alveolar hypoventilation: Secondary | ICD-10-CM | POA: Diagnosis not present

## 2015-05-31 DIAGNOSIS — F411 Generalized anxiety disorder: Secondary | ICD-10-CM

## 2015-05-31 DIAGNOSIS — F329 Major depressive disorder, single episode, unspecified: Secondary | ICD-10-CM

## 2015-05-31 DIAGNOSIS — G43719 Chronic migraine without aura, intractable, without status migrainosus: Secondary | ICD-10-CM | POA: Diagnosis not present

## 2015-05-31 DIAGNOSIS — D649 Anemia, unspecified: Secondary | ICD-10-CM

## 2015-05-31 DIAGNOSIS — Z7901 Long term (current) use of anticoagulants: Secondary | ICD-10-CM | POA: Diagnosis not present

## 2015-05-31 DIAGNOSIS — E559 Vitamin D deficiency, unspecified: Secondary | ICD-10-CM | POA: Diagnosis not present

## 2015-05-31 DIAGNOSIS — Z794 Long term (current) use of insulin: Secondary | ICD-10-CM

## 2015-05-31 DIAGNOSIS — E785 Hyperlipidemia, unspecified: Secondary | ICD-10-CM

## 2015-05-31 DIAGNOSIS — K2100 Gastro-esophageal reflux disease with esophagitis, without bleeding: Secondary | ICD-10-CM

## 2015-05-31 DIAGNOSIS — G4733 Obstructive sleep apnea (adult) (pediatric): Secondary | ICD-10-CM | POA: Diagnosis not present

## 2015-05-31 DIAGNOSIS — E1165 Type 2 diabetes mellitus with hyperglycemia: Secondary | ICD-10-CM | POA: Diagnosis not present

## 2015-05-31 DIAGNOSIS — N400 Enlarged prostate without lower urinary tract symptoms: Secondary | ICD-10-CM | POA: Diagnosis not present

## 2015-05-31 DIAGNOSIS — E349 Endocrine disorder, unspecified: Secondary | ICD-10-CM

## 2015-05-31 DIAGNOSIS — I82409 Acute embolism and thrombosis of unspecified deep veins of unspecified lower extremity: Secondary | ICD-10-CM

## 2015-05-31 DIAGNOSIS — E291 Testicular hypofunction: Secondary | ICD-10-CM | POA: Diagnosis not present

## 2015-05-31 DIAGNOSIS — G894 Chronic pain syndrome: Secondary | ICD-10-CM

## 2015-05-31 DIAGNOSIS — J810 Acute pulmonary edema: Secondary | ICD-10-CM | POA: Diagnosis not present

## 2015-05-31 DIAGNOSIS — F32A Depression, unspecified: Secondary | ICD-10-CM

## 2015-05-31 DIAGNOSIS — G40909 Epilepsy, unspecified, not intractable, without status epilepticus: Secondary | ICD-10-CM | POA: Diagnosis not present

## 2015-05-31 DIAGNOSIS — E1169 Type 2 diabetes mellitus with other specified complication: Secondary | ICD-10-CM | POA: Insufficient documentation

## 2015-05-31 DIAGNOSIS — B356 Tinea cruris: Secondary | ICD-10-CM

## 2015-05-31 DIAGNOSIS — M2550 Pain in unspecified joint: Secondary | ICD-10-CM

## 2015-05-31 LAB — POCT INR: INR: 2

## 2015-05-31 LAB — POCT GLYCOSYLATED HEMOGLOBIN (HGB A1C): Hemoglobin A1C: 8

## 2015-05-31 MED ORDER — SIMVASTATIN 20 MG PO TABS: ORAL_TABLET | ORAL | Status: DC

## 2015-05-31 MED ORDER — NAFTIFINE HCL 1 % EX CREA: TOPICAL_CREAM | Freq: Every day | CUTANEOUS | Status: DC

## 2015-05-31 MED ORDER — FENTANYL 75 MCG/HR TD PT72: 75.0000 ug | MEDICATED_PATCH | TRANSDERMAL | Status: DC

## 2015-05-31 MED ORDER — TAMSULOSIN HCL 0.4 MG PO CAPS: ORAL_CAPSULE | ORAL | Status: DC

## 2015-05-31 MED ORDER — PREGABALIN 50 MG PO CAPS: 50.0000 mg | ORAL_CAPSULE | Freq: Three times a day (TID) | ORAL | Status: DC

## 2015-05-31 MED ORDER — HYDROCODONE-ACETAMINOPHEN 7.5-325 MG PO TABS: 1.0000 | ORAL_TABLET | Freq: Every day | ORAL | Status: DC

## 2015-05-31 NOTE — Progress Notes (Signed)
Subjective:    Patient ID: [name], male    DOB: 1966-05-19, 49 y.o.   MRN: [id number]  Pt presents to the office today for chronic follow up.  Diabetes He presents for his follow-up diabetic visit. He has type 2 diabetes mellitus. His disease course has been improving. Hypoglycemia symptoms include seizures. Pertinent negatives for hypoglycemia include no confusion, dizziness or nervousness/anxiousness. Associated symptoms include foot paresthesias. Pertinent negatives for diabetes include no blurred vision, no foot ulcerations and no visual change. There are no hypoglycemic complications. Symptoms are worsening. Diabetic complications include peripheral neuropathy. Pertinent negatives for diabetic complications include no CVA or heart disease. Risk factors for coronary artery disease include diabetes mellitus, dyslipidemia, hypertension, male sex, obesity and sedentary lifestyle. Current diabetic treatment includes insulin injections and oral agent (dual therapy). He is compliant with treatment all of the time. He is following a generally unhealthy diet. His breakfast blood glucose range is generally 180-200 mg/dl. Eye exam is current.  Hyperlipidemia This is a chronic problem. The current episode started more than 1 year ago. The problem is controlled. Recent lipid tests were reviewed and are normal. Exacerbating diseases include diabetes and obesity. He has no history of hypothyroidism. Factors aggravating his hyperlipidemia include fatty foods. Pertinent negatives include no leg pain or myalgias. Current antihyperlipidemic treatment includes statins. The current treatment provides moderate improvement of lipids. Risk factors for coronary artery disease include diabetes mellitus, dyslipidemia, family history, hypertension, obesity, male sex and a sedentary lifestyle.  Gastroesophageal Reflux He reports no abdominal pain, no coughing, no heartburn, no nausea or no sore throat. This is a  chronic problem. The current episode started more than 1 year ago. The problem occurs rarely. The problem has been resolved. The symptoms are aggravated by certain foods. He has tried a PPI for the symptoms. The treatment provided significant relief.  Anxiety Presents for follow-up visit. Onset was more than 5 years ago. Symptoms include depressed mood, excessive worry and insomnia. Patient reports no confusion, dizziness, nausea, nervous/anxious behavior or panic. Symptoms occur occasionally. The quality of sleep is good.   His past medical history is significant for depression. There is no history of anxiety/panic attacks. Past treatments include SSRIs. The treatment provided moderate relief.  Benign Prostatic Hypertrophy This is a chronic problem. The current episode started more than 1 year ago. The problem has been resolved since onset. Irritative symptoms do not include frequency, nocturia or urgency. Obstructive symptoms do not include an intermittent stream or a weak stream. Pertinent negatives include no dysuria, hematuria, nausea or vomiting. Past treatments include tamsulosin. The treatment provided significant relief.  Seizures  This is a chronic problem. The current episode started more than 1 week ago. Progression since onset: Pt states he usually has seziures at night, and has them every few weeks. Pt states his last one was last week-Pt is followed by neurologists every few months. Pertinent negatives include no confusion, no sore throat, no cough, no nausea and no vomiting.  Migraine  This is a chronic (pt is followed by nuerologists every few month- Pt was getting lidocaine injections) problem. The current episode started more than 1 year ago. The problem occurs intermittently. The problem has been waxing and waning. The pain does not radiate. The pain quality is similar to prior headaches. The quality of the pain is described as aching. The pain is at a severity of 9/10. The pain is  moderate. Associated symptoms include insomnia, photophobia and seizures. Pertinent negatives include no  abdominal pain, blurred vision, coughing, dizziness, nausea, sore throat, visual change or vomiting. His past medical history is significant for obesity.  Penile Discharge The patient's primary symptoms include genital itching, penile discharge and penile pain. This is a new problem. The current episode started 1 to 4 weeks ago. The problem occurs intermittently. The problem has been unchanged. The pain is mild. Pertinent negatives include no abdominal pain, coughing, dysuria, frequency, nausea, sore throat, urgency or vomiting. He has tried OTC analgesics and rest for the symptoms. The treatment provided mild relief. His past medical history is significant for BPH.      Review of Systems  Constitutional: Negative.   HENT: Negative.  Negative for sore throat.   Eyes: Positive for photophobia. Negative for blurred vision.  Respiratory: Negative.  Negative for cough.   Cardiovascular: Negative.   Gastrointestinal: Negative.  Negative for heartburn, nausea, vomiting and abdominal pain.  Endocrine: Negative.   Genitourinary: Positive for discharge and penile pain. Negative for dysuria, urgency, frequency, hematuria and nocturia.  Musculoskeletal: Negative.  Negative for myalgias.  Neurological: Positive for seizures. Negative for dizziness.  Hematological: Negative.   Psychiatric/Behavioral: Negative for confusion. The patient has insomnia. The patient is not nervous/anxious.   All other systems reviewed and are negative.      Objective:   Physical Exam  Constitutional: He is oriented to person, place, and time. He appears well-developed and well-nourished. No distress.  Morbid obese  HENT:  Head: Normocephalic.  Right Ear: External ear normal.  Left Ear: External ear normal.  Nose: Nose normal.  Mouth/Throat: Oropharynx is clear and moist.  Eyes: Pupils are equal, round, and  reactive to light. Right eye exhibits no discharge. Left eye exhibits no discharge.  Neck: Normal range of motion. Neck supple. No thyromegaly present.  Cardiovascular: Normal rate, regular rhythm, normal heart sounds and intact distal pulses.   No murmur heard. Pulmonary/Chest: Effort normal and breath sounds normal. No respiratory distress. He has no wheezes.  Abdominal: Soft. Bowel sounds are normal. He exhibits no distension. There is no tenderness.  Musculoskeletal: Normal range of motion. He exhibits edema (trace amt). He exhibits no tenderness.  Pt in electric wheelchair   Neurological: He is alert and oriented to person, place, and time. He has normal reflexes. No cranial nerve deficit.  Skin: Skin is warm and dry. No rash noted. No erythema.  Psychiatric: He has a normal mood and affect. His behavior is normal. Judgment and thought content normal.  Vitals reviewed.     BP 134/86 mmHg  Pulse 59  Temp(Src) 97.5 F (36.4 C) (Oral)  Wt      Assessment & Plan:  1. Long-term (current) use of anticoagulants - POCT INR - CMP14+EGFR  2. DVT (deep venous thrombosis), unspecified laterality  - POCT INR - CMP14+EGFR  3. Postoperative pulmonary edema (HCC) - CMP14+EGFR  4. Intractable chronic migraine without aura and without status migrainosus  - CMP14+EGFR  5. OSA (obstructive sleep apnea)  - CMP14+EGFR  6. Gastroesophageal reflux disease with esophagitis  - CMP14+EGFR  7. Type 2 diabetes mellitus with hyperglycemia, with long-term current use of insulin (HCC)  - POCT glycosylated hemoglobin (Hb A1C) - CMP14+EGFR - Ambulatory referral to Ophthalmology  8. Seizure disorder ([place])  - CMP14+EGFR  9. BPH (benign prostatic hyperplasia)  - CMP14+EGFR - PSA, total and free - tamsulosin (FLOMAX) 0.4 MG CAPS capsule; TAKE 1 CAPSULE (0.4 MG TOTAL) BY MOUTH DAILY.  Dispense: 90 capsule; Refill: 1  10. Vitamin D deficiency  -  CMP14+EGFR  11. Testosterone  deficiency  - CMP14+EGFR  12. Obesity hypoventilation syndrome (HCC)  - CMP14+EGFR  13. Morbid obesity, unspecified obesity type ([place])  - CMP14+EGFR  14. GAD (generalized anxiety disorder)  - CMP14+EGFR  15. Chronic pain syndrome  - CMP14+EGFR - fentaNYL (DURAGESIC) 75 MCG/HR; Place 1 patch (75 mcg total) onto the skin every 3 (three) days.  Dispense: 10 patch; Refill: 0 - HYDROcodone-acetaminophen (NORCO) 7.5-325 MG tablet; Take 1 tablet by mouth daily. As needed for BTP (1 tablet per day)  Dispense: 30 tablet; Refill: 0  16. Anemia, unspecified anemia type  - CMP14+EGFR  17. Joint pain  - CMP14+EGFR - Arthritis Panel  18. Hyperlipidemia  - CMP14+EGFR - Lipid panel - simvastatin (ZOCOR) 20 MG tablet; TAKE 1 TABLET (20 MG TOTAL) BY MOUTH AT BEDTIME.  Dispense: 90 tablet; Refill: 1  19. Depression  - pregabalin (LYRICA) 50 MG capsule; Take 1 capsule (50 mg total) by mouth 3 (three) times daily.  Dispense: 270 capsule; Refill: 3  20. Jock itch -Keep clean and dry - naftifine (NAFTIN) 1 % cream; Apply topically daily.  Dispense: 30 g; Refill: 0   Continue all meds Labs pending Health Maintenance reviewed Diet and exercise encouraged RTO 3 months   Evelina Dun, FNP

## 2015-05-31 NOTE — Patient Instructions (Addendum)
Anticoagulation Dose Instructions as of 05/31/2015      [name] Tue Wed Thu Fri Sat   New Dose 5 mg 5 mg 7.5 mg 5 mg 5 mg 5 mg 7.5 mg    Description        Continue current warfarin dose of 1 and 1/2 tablets on Tuesdays and Saturdays and 1 tablet all other days.      INR was 2.0 today  Health Maintenance, Male A healthy lifestyle and preventative care can promote health and wellness.  Maintain regular health, dental, and eye exams.  Eat a healthy diet. Foods like vegetables, fruits, whole grains, low-fat dairy products, and lean protein foods contain the nutrients you need and are low in calories. Decrease your intake of foods high in solid fats, added sugars, and salt. Get information about a proper diet from your health care provider, if necessary.  Regular physical exercise is one of the most important things you can do for your health. Most adults should get at least 150 minutes of moderate-intensity exercise (any activity that increases your heart rate and causes you to sweat) each week. In addition, most adults need muscle-strengthening exercises on 2 or more days a week.   Maintain a healthy weight. The body mass index (BMI) is a screening tool to identify possible weight problems. It provides an estimate of body fat based on height and weight. Your health care provider can find your BMI and can help you achieve or maintain a healthy weight. For males 20 years and older:  A BMI below 18.5 is considered underweight.  A BMI of 18.5 to 24.9 is normal.  A BMI of 25 to 29.9 is considered overweight.  A BMI of 30 and above is considered obese.  Maintain normal blood lipids and cholesterol by exercising and minimizing your intake of saturated fat. Eat a balanced diet with plenty of fruits and vegetables. Blood tests for lipids and cholesterol should begin at age 60 and be repeated every 5 years. If your lipid or cholesterol levels are high, you are over age 68, or you are at high risk  for heart disease, you may need your cholesterol levels checked more frequently.Ongoing high lipid and cholesterol levels should be treated with medicines if diet and exercise are not working.  If you smoke, find out from your health care provider how to quit. If you do not use tobacco, do not start.  Lung cancer screening is recommended for adults aged 74-80 years who are at high risk for developing lung cancer because of a history of smoking. A yearly low-dose CT scan of the lungs is recommended for people who have at least a 30-pack-year history of smoking and are current smokers or have quit within the past 15 years. A pack year of smoking is smoking an average of 1 pack of cigarettes a day for 1 year (for example, a 30-pack-year history of smoking could mean smoking 1 pack a day for 30 years or 2 packs a day for 15 years). Yearly screening should continue until the smoker has stopped smoking for at least 15 years. Yearly screening should be stopped for people who develop a health problem that would prevent them from having lung cancer treatment.  If you choose to drink alcohol, do not have more than 2 drinks per day. One drink is considered to be 12 oz (360 mL) of beer, 5 oz (150 mL) of wine, or 1.5 oz (45 mL) of liquor.  Avoid the use  of street drugs. Do not share needles with anyone. Ask for help if you need support or instructions about stopping the use of drugs.  High blood pressure causes heart disease and increases the risk of stroke. High blood pressure is more likely to develop in:  People who have blood pressure in the end of the normal range (100-139/85-89 mm Hg).  People who are overweight or obese.  People who are African American.  If you are 35-66 years of age, have your blood pressure checked every 3-5 years. If you are 9 years of age or older, have your blood pressure checked every year. You should have your blood pressure measured twice--once when you are at a hospital or  clinic, and once when you are not at a hospital or clinic. Record the average of the two measurements. To check your blood pressure when you are not at a hospital or clinic, you can use:  An automated blood pressure machine at a pharmacy.  A home blood pressure monitor.  If you are 18-72 years old, ask your health care provider if you should take aspirin to prevent heart disease.  Diabetes screening involves taking a blood sample to check your fasting blood sugar level. This should be done once every 3 years after age 26 if you are at a normal weight and without risk factors for diabetes. Testing should be considered at a younger age or be carried out more frequently if you are overweight and have at least 1 risk factor for diabetes.  Colorectal cancer can be detected and often prevented. Most routine colorectal cancer screening begins at the age of 37 and continues through age 28. However, your health care provider may recommend screening at an earlier age if you have risk factors for colon cancer. On a yearly basis, your health care provider may provide home test kits to check for hidden blood in the stool. A small camera at the end of a tube may be used to directly examine the colon (sigmoidoscopy or colonoscopy) to detect the earliest forms of colorectal cancer. Talk to your health care provider about this at age 43 when routine screening begins. A direct exam of the colon should be repeated every 5-10 years through age 75, unless early forms of precancerous polyps or small growths are found.  People who are at an increased risk for hepatitis B should be screened for this virus. You are considered at high risk for hepatitis B if:  You were born in a country where hepatitis B occurs often. Talk with your health care provider about which countries are considered high risk.  Your parents were born in a high-risk country and you have not received a shot to protect against hepatitis B (hepatitis B  vaccine).  You have HIV or AIDS.  You use needles to inject street drugs.  You live with, or have sex with, someone who has hepatitis B.  You are a man who has sex with other men (MSM).  You get hemodialysis treatment.  You take certain medicines for conditions like cancer, organ transplantation, and autoimmune conditions.  Hepatitis C blood testing is recommended for all people born from [birth year] through 1964 and any individual with known risk factors for hepatitis C.  Healthy men should no longer receive prostate-specific antigen (PSA) blood tests as part of routine cancer screening. Talk to your health care provider about prostate cancer screening.  Testicular cancer screening is not recommended for adolescents or adult males who have no symptoms.  Screening includes self-exam, a health care provider exam, and other screening tests. Consult with your health care provider about any symptoms you have or any concerns you have about testicular cancer.  Practice safe sex. Use condoms and avoid high-risk sexual practices to reduce the spread of sexually transmitted infections (STIs).  You should be screened for STIs, including gonorrhea and chlamydia if:  You are sexually active and are younger than 24 years.  You are older than 24 years, and your health care provider tells you that you are at risk for this type of infection.  Your sexual activity has changed since you were last screened, and you are at an increased risk for chlamydia or gonorrhea. Ask your health care provider if you are at risk.  If you are at risk of being infected with HIV, it is recommended that you take a prescription medicine daily to prevent HIV infection. This is called pre-exposure prophylaxis (PrEP). You are considered at risk if:  You are a man who has sex with other men (MSM).  You are a heterosexual man who is sexually active with multiple partners.  You take drugs by injection.  You are sexually active  with a partner who has HIV.  Talk with your health care provider about whether you are at high risk of being infected with HIV. If you choose to begin PrEP, you should first be tested for HIV. You should then be tested every 3 months for as long as you are taking PrEP.  Use sunscreen. Apply sunscreen liberally and repeatedly throughout the day. You should seek shade when your shadow is shorter than you. Protect yourself by wearing long sleeves, pants, a wide-brimmed hat, and sunglasses year round whenever you are outdoors.  Tell your health care provider of new moles or changes in moles, especially if there is a change in shape or color. Also, tell your health care provider if a mole is larger than the size of a pencil eraser.  A one-time screening for abdominal aortic aneurysm (AAA) and surgical repair of large AAAs by ultrasound is recommended for men aged 29-75 years who are current or former smokers.  Stay current with your vaccines (immunizations).   This information is not intended to replace advice given to you by your health care provider. Make sure you discuss any questions you have with your health care provider.   Document Released: 12/12/2007 Document Revised: 07/06/2014 Document Reviewed: 11/10/2010 Elsevier Interactive Patient Education [employer].

## 2015-06-01 LAB — CMP14+EGFR
ALT: 24 IU/L (ref 0–44)
AST: 29 IU/L (ref 0–40)
Albumin/Globulin Ratio: 1 — ABNORMAL LOW (ref 1.1–2.5)
Albumin: 3.3 g/dL — ABNORMAL LOW (ref 3.5–5.5)
Alkaline Phosphatase: 89 IU/L (ref 39–117)
BUN/Creatinine Ratio: 23 — ABNORMAL HIGH (ref 9–20)
BUN: 37 mg/dL — ABNORMAL HIGH (ref 6–24)
Bilirubin Total: 0.4 mg/dL (ref 0.0–1.2)
CO2: 28 mmol/L (ref 18–29)
Calcium: 8.7 mg/dL (ref 8.7–10.2)
Chloride: 96 mmol/L — ABNORMAL LOW (ref 97–106)
Creatinine, Ser: 1.63 mg/dL — ABNORMAL HIGH (ref 0.76–1.27)
GFR calc Af Amer: 57 mL/min/{1.73_m2} — ABNORMAL LOW (ref >59)
GFR calc non Af Amer: 49 mL/min/{1.73_m2} — ABNORMAL LOW (ref >59)
Globulin, Total: 3.4 g/dL (ref 1.5–4.5)
Glucose: 244 mg/dL — ABNORMAL HIGH (ref 65–99)
Potassium: 4.5 mmol/L (ref 3.5–5.2)
Sodium: 141 mmol/L (ref 136–144)
Total Protein: 6.7 g/dL (ref 6.0–8.5)

## 2015-06-01 LAB — ARTHRITIS PANEL
Basophils Absolute: 0 10*3/uL (ref 0.0–0.2)
Basos: 1 %
EOS (ABSOLUTE): 0.2 10*3/uL (ref 0.0–0.4)
Eos: 5 %
Hematocrit: 40.8 % (ref 37.5–51.0)
Hemoglobin: 13.2 g/dL (ref 12.6–17.7)
Immature Grans (Abs): 0 10*3/uL (ref 0.0–0.1)
Immature Granulocytes: 0 %
Lymphocytes Absolute: 1.3 10*3/uL (ref 0.7–3.1)
Lymphs: 35 %
MCH: 28.1 pg (ref 26.6–33.0)
MCHC: 32.4 g/dL (ref 31.5–35.7)
MCV: 87 fL (ref 79–97)
Monocytes Absolute: 0.3 10*3/uL (ref 0.1–0.9)
Monocytes: 7 %
Neutrophils Absolute: 2.1 10*3/uL (ref 1.4–7.0)
Neutrophils: 52 %
Platelets: 107 10*3/uL — ABNORMAL LOW (ref 150–379)
RBC: 4.7 x10E6/uL (ref 4.14–5.80)
RDW: 15.3 % (ref 12.3–15.4)
Rhuematoid fact SerPl-aCnc: 23.8 IU/mL — ABNORMAL HIGH (ref 0.0–13.9)
Sed Rate: 26 mm/hr — ABNORMAL HIGH (ref 0–15)
Uric Acid: 7 mg/dL (ref 3.7–8.6)
WBC: 3.9 10*3/uL (ref 3.4–10.8)

## 2015-06-01 LAB — LIPID PANEL
Chol/HDL Ratio: 3.5 ratio units (ref 0.0–5.0)
Cholesterol, Total: 108 mg/dL (ref 100–199)
HDL: 31 mg/dL — ABNORMAL LOW (ref >39)
LDL Calculated: 41 mg/dL (ref 0–99)
Triglycerides: 179 mg/dL — ABNORMAL HIGH (ref 0–149)
VLDL Cholesterol Cal: 36 mg/dL (ref 5–40)

## 2015-06-01 LAB — PSA, TOTAL AND FREE
PSA, Free Pct: >20 %
PSA, Free: 0.02 ng/mL
Prostate Specific Ag, Serum: <0.1 ng/mL (ref 0.0–4.0)

## 2015-06-03 ENCOUNTER — Other Ambulatory Visit: Payer: Self-pay | Admitting: Family

## 2015-06-03 DIAGNOSIS — R768 Other specified abnormal immunological findings in serum: Secondary | ICD-10-CM

## 2015-06-04 MED ORDER — FUROSEMIDE 80 MG PO TABS: 80.0000 mg | ORAL_TABLET | Freq: Two times a day (BID) | ORAL | Status: DC

## 2015-06-04 NOTE — Telephone Encounter (Signed)
Aware, script sent to pharmacy.

## 2015-06-09 ENCOUNTER — Other Ambulatory Visit: Payer: Self-pay | Admitting: Physician Assistant

## 2015-06-11 DIAGNOSIS — M2550 Pain in unspecified joint: Secondary | ICD-10-CM | POA: Diagnosis not present

## 2015-06-11 DIAGNOSIS — E1165 Type 2 diabetes mellitus with hyperglycemia: Secondary | ICD-10-CM | POA: Diagnosis not present

## 2015-06-11 DIAGNOSIS — Z794 Long term (current) use of insulin: Secondary | ICD-10-CM | POA: Diagnosis not present

## 2015-06-11 DIAGNOSIS — N183 Chronic kidney disease, stage 3 (moderate): Secondary | ICD-10-CM | POA: Diagnosis not present

## 2015-06-11 DIAGNOSIS — M7989 Other specified soft tissue disorders: Secondary | ICD-10-CM | POA: Diagnosis not present

## 2015-06-11 DIAGNOSIS — G894 Chronic pain syndrome: Secondary | ICD-10-CM | POA: Diagnosis not present

## 2015-06-13 DIAGNOSIS — J449 Chronic obstructive pulmonary disease, unspecified: Secondary | ICD-10-CM | POA: Diagnosis not present

## 2015-06-18 ENCOUNTER — Other Ambulatory Visit: Payer: Self-pay | Admitting: Family

## 2015-06-24 ENCOUNTER — Other Ambulatory Visit: Payer: Self-pay | Admitting: Family

## 2015-06-25 NOTE — Telephone Encounter (Signed)
Last filled 05/06/15, last seen 05/31/15. Route to pool, nurse to call in

## 2015-06-25 NOTE — Telephone Encounter (Signed)
rx called into pharmacy

## 2015-07-03 ENCOUNTER — Ambulatory Visit (INDEPENDENT_AMBULATORY_CARE_PROVIDER_SITE_OTHER): Payer: Medicare Other | Admitting: Pharmacist

## 2015-07-03 DIAGNOSIS — G894 Chronic pain syndrome: Secondary | ICD-10-CM

## 2015-07-03 DIAGNOSIS — I82409 Acute embolism and thrombosis of unspecified deep veins of unspecified lower extremity: Secondary | ICD-10-CM | POA: Diagnosis not present

## 2015-07-03 DIAGNOSIS — Z7901 Long term (current) use of anticoagulants: Secondary | ICD-10-CM | POA: Diagnosis not present

## 2015-07-03 DIAGNOSIS — J810 Acute pulmonary edema: Secondary | ICD-10-CM

## 2015-07-03 LAB — POCT INR: INR: 1.6

## 2015-07-03 MED ORDER — FENTANYL 75 MCG/HR TD PT72: 75.0000 ug | MEDICATED_PATCH | TRANSDERMAL | Status: DC

## 2015-07-03 MED ORDER — HYDROCODONE-ACETAMINOPHEN 7.5-325 MG PO TABS: 1.0000 | ORAL_TABLET | Freq: Every day | ORAL | Status: DC

## 2015-07-03 NOTE — Patient Instructions (Signed)
Anticoagulation Dose Instructions as of 07/03/2015      [name] Tue Wed Thu Fri Sat   New Dose 5 mg 7.5 mg 5 mg 7.5 mg 5 mg 7.5 mg 5 mg    Description        Increase warfarin dose to 5mg  - take 1 and 1/2 tablets on Monday, wednesdays and fridays.  Take 1 tablet all other days.       INR was 1.6 today

## 2015-07-05 ENCOUNTER — Encounter: Payer: Self-pay | Admitting: Pharmacist

## 2015-07-08 ENCOUNTER — Other Ambulatory Visit: Payer: Self-pay | Admitting: Family

## 2015-07-10 ENCOUNTER — Telehealth: Payer: Self-pay | Admitting: Pharmacist

## 2015-07-10 NOTE — Telephone Encounter (Signed)
Pain Rx was mail to patient Thursday, January 5th but has not received yet.  He will apply last fentanyl patch tomorrow.  Will check to see if recived in mail to tomorrow, if not will redo Rx and leave at front desk for patient to pick up.

## 2015-07-11 ENCOUNTER — Other Ambulatory Visit: Payer: Self-pay | Admitting: Pharmacist

## 2015-07-11 DIAGNOSIS — G894 Chronic pain syndrome: Secondary | ICD-10-CM

## 2015-07-11 MED ORDER — FENTANYL 75 MCG/HR TD PT72: 75.0000 ug | MEDICATED_PATCH | TRANSDERMAL | Status: DC

## 2015-07-11 MED ORDER — HYDROCODONE-ACETAMINOPHEN 7.5-325 MG PO TABS: 1.0000 | ORAL_TABLET | Freq: Every day | ORAL | Status: DC

## 2015-07-11 NOTE — Telephone Encounter (Signed)
Rx to replace mailed rx's that patient did not receive.  Wife will pick up tomorrow 07/12/2015.

## 2015-07-12 ENCOUNTER — Telehealth: Payer: Self-pay | Admitting: Family

## 2015-07-12 NOTE — Telephone Encounter (Signed)
patietn's wife states they received mailed Rx's.  Rx that was printed yesterday was voided.

## 2015-07-14 ENCOUNTER — Other Ambulatory Visit: Payer: Self-pay | Admitting: Family

## 2015-07-14 DIAGNOSIS — J449 Chronic obstructive pulmonary disease, unspecified: Secondary | ICD-10-CM | POA: Diagnosis not present

## 2015-07-15 ENCOUNTER — Other Ambulatory Visit: Payer: Self-pay | Admitting: Family

## 2015-07-16 ENCOUNTER — Other Ambulatory Visit: Payer: Self-pay

## 2015-07-16 MED ORDER — INSULIN LISPRO 100 UNIT/ML (KWIKPEN): 35.0000 [IU] | PEN_INJECTOR | Freq: Three times a day (TID) | SUBCUTANEOUS | Status: DC

## 2015-08-02 ENCOUNTER — Encounter: Payer: Self-pay | Admitting: Pharmacist

## 2015-08-02 ENCOUNTER — Encounter: Payer: Self-pay | Admitting: Family

## 2015-08-02 ENCOUNTER — Ambulatory Visit (INDEPENDENT_AMBULATORY_CARE_PROVIDER_SITE_OTHER): Payer: Medicare Other | Admitting: Family

## 2015-08-02 DIAGNOSIS — J810 Acute pulmonary edema: Secondary | ICD-10-CM

## 2015-08-02 DIAGNOSIS — M05719 Rheumatoid arthritis with rheumatoid factor of unspecified shoulder without organ or systems involvement: Secondary | ICD-10-CM

## 2015-08-02 DIAGNOSIS — M549 Dorsalgia, unspecified: Secondary | ICD-10-CM

## 2015-08-02 DIAGNOSIS — M069 Rheumatoid arthritis, unspecified: Secondary | ICD-10-CM | POA: Insufficient documentation

## 2015-08-02 DIAGNOSIS — Z7901 Long term (current) use of anticoagulants: Secondary | ICD-10-CM | POA: Diagnosis not present

## 2015-08-02 DIAGNOSIS — F1120 Opioid dependence, uncomplicated: Secondary | ICD-10-CM | POA: Insufficient documentation

## 2015-08-02 DIAGNOSIS — I82409 Acute embolism and thrombosis of unspecified deep veins of unspecified lower extremity: Secondary | ICD-10-CM | POA: Diagnosis not present

## 2015-08-02 DIAGNOSIS — G8929 Other chronic pain: Secondary | ICD-10-CM | POA: Diagnosis not present

## 2015-08-02 DIAGNOSIS — G894 Chronic pain syndrome: Secondary | ICD-10-CM | POA: Diagnosis not present

## 2015-08-02 LAB — POCT INR: INR: 2.4

## 2015-08-02 MED ORDER — HYDROCODONE-ACETAMINOPHEN 7.5-325 MG PO TABS: 1.0000 | ORAL_TABLET | Freq: Four times a day (QID) | ORAL | Status: DC | PRN

## 2015-08-02 MED ORDER — FENTANYL 75 MCG/HR TD PT72: 75.0000 ug | MEDICATED_PATCH | TRANSDERMAL | Status: DC

## 2015-08-02 MED ORDER — METHYLPREDNISOLONE ACETATE 80 MG/ML IJ SUSP
80.0000 mg | Freq: Once | INTRAMUSCULAR | Status: AC
  Administered 2015-08-02: 80 mg via INTRAMUSCULAR

## 2015-08-02 MED ORDER — HYDROCODONE-ACETAMINOPHEN 7.5-325 MG PO TABS: 1.0000 | ORAL_TABLET | Freq: Every day | ORAL | Status: DC

## 2015-08-02 MED ORDER — PREDNISONE 10 MG (21) PO TBPK: 10.0000 mg | ORAL_TABLET | Freq: Every day | ORAL | Status: DC

## 2015-08-02 NOTE — Progress Notes (Signed)
Subjective:    Patient ID: [name], male    DOB: November 30, 1965, 50 y.o.   MRN: [id number]  HPI [facility] Controlled Substance Abuse database reviewed- Yes  Depression screen [facility] 2/9 08/02/2015 02/15/2015 11/23/2014 09/28/2013  Decreased Interest 0 1 0 0  Down, Depressed, Hopeless 0 1 0 0  PHQ - 2 Score 0 2 0 0  Altered sleeping - 0 - -  Tired, decreased energy - 1 - -  Change in appetite - 0 - -  Feeling bad or failure about yourself  - 0 - -  Trouble concentrating - 0 - -  Moving slowly or fidgety/restless - (No Data) - -  Suicidal thoughts - 0 - -  PHQ-9 Score - 3 - -  Difficult doing work/chores - Somewhat difficult - -    No flowsheet data found.     [employer] drug screen performed- Yes  SOAPP  0= never  1= seldom  2=sometimes  3= often  4= very often  How often do you have mood swings? 0 How often do you smoke a cigarette within an hour after waling up? 0 How often have you taken medication other than the way that it was prescribed?0 How often have you used illegal drugs in the past 5 years? 0 How often, in your lifetime, have you had legal problems or been arrested? 0  Score 0  Alcohol Audit - How often during the last year have found that you: 0-Never   1- Less than monthly   2- Monthly     3-Weekly     4-daily or almost daily  - found that you were not able to stop drinking once you started- 0 -failed to do what was normally expected of you because of drinking- 0 -needed a first drink in the morning- 0 -had a feeling of guilt or remorse after drinking- 0 -are/were unable to remember what happened the night before because of your drinking- 0  0- NO   2- yes but not in last year  4- yes during last year -Have you or someone else been injured because of your drinking- 0 - Has anyone been concerned about your drinking or suggested you cut down- 0        TOTAL- 0  ( 0-7- alcohol education, 8-15- simple advice, 16-19 simple advice plus counseling,  20-40 referral for evaluation and treatment 0   Designated Pharmacy- [facility], [address]  Pain assessment:  Pain location- lower back pain, bilateral knee, and generalized joint pain Pain on scale of 1-10- 7  Frequency- constant  What increases pain-moving or walking  What makes pain Better-rest Effects on ADL - Unable to perform ADL of preparing meals   Prior treatments tried and failed-  Current treatments- Fentanyl 75 mcg and hydrocodone-acetaminophen 7.5-325 mg  Pain management agreement reviewed and signed- Yes    Review of Systems  Constitutional: Negative.   HENT: Negative.   Respiratory: Negative.   Cardiovascular: Negative.   Gastrointestinal: Negative.   Endocrine: Negative.   Genitourinary: Negative.   Musculoskeletal: Positive for myalgias, back pain, joint swelling, arthralgias and gait problem.  Neurological: Negative.   Hematological: Negative.   Psychiatric/Behavioral: Negative.   All other systems reviewed and are negative.      Objective:   Physical Exam  Constitutional: He is oriented to person, place, and time. He appears well-developed and well-nourished. No distress.  HENT:  Head: Normocephalic.  Right Ear: External ear normal.  Left Ear:  External ear normal.  Mouth/Throat: Oropharynx is clear and moist.  Eyes: Pupils are equal, round, and reactive to light. Right eye exhibits no discharge. Left eye exhibits no discharge.  Neck: Normal range of motion. Neck supple. No thyromegaly present.  Cardiovascular: Normal rate, regular rhythm, normal heart sounds and intact distal pulses.   No murmur heard. Pulmonary/Chest: Effort normal and breath sounds normal. No respiratory distress. He has no wheezes.  Abdominal: Soft. Bowel sounds are normal. He exhibits no distension. There is no tenderness.  Musculoskeletal: Normal range of motion. He exhibits no edema or tenderness.  Pt is in wheelchair   Neurological: He is alert and oriented to  person, place, and time. He has normal reflexes. No cranial nerve deficit.  Skin: Skin is warm and dry. No rash noted. No erythema.  Psychiatric: He has a normal mood and affect. His behavior is normal. Judgment and thought content normal.  Vitals reviewed.     BP 126/94 mmHg  Pulse 70  Temp(Src) 98.7 F (37.1 C) (Oral)  Ht 6' (1.829 m)  Wt      Assessment & Plan:  1. Long-term (current) use of anticoagulants - POCT INR  2. DVT (deep venous thrombosis), unspecified laterality - POCT INR  3. Morbid obesity, unspecified obesity type ([place])  4. Chronic pain syndrome -Pt signed pain contract- will scan into EPIC -Discussed the rules and expectations of this contract in detail with pt - ToxASSURE Select 13 (MW), Urine - HYDROcodone-acetaminophen (NORCO) 7.5-325 MG tablet; Take 1 tablet by mouth daily. As needed for BTP (1 tablet per day)  Dispense: 30 tablet; Refill: 0 - fentaNYL (DURAGESIC) 75 MCG/HR; Place 1 patch (75 mcg total) onto the skin every 3 (three) days.  Dispense: 10 patch; Refill: 0 - fentaNYL (DURAGESIC) 75 MCG/HR; Place 1 patch (75 mcg total) onto the skin every 3 (three) days.  Dispense: 10 patch; Refill: 0 - fentaNYL (DURAGESIC) 75 MCG/HR; Place 1 patch (75 mcg total) onto the skin every 3 (three) days.  Dispense: 10 patch; Refill: 0 - HYDROcodone-acetaminophen (NORCO) 7.5-325 MG tablet; Take 1 tablet by mouth every 6 (six) hours as needed for moderate pain.  Dispense: 30 tablet; Refill: 0 - HYDROcodone-acetaminophen (NORCO) 7.5-325 MG tablet; Take 1 tablet by mouth every 6 (six) hours as needed for moderate pain.  Dispense: 30 tablet; Refill: 0  5. 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  Dispense: 10 patch; Refill: 0 -  fentaNYL (DURAGESIC) 75 MCG/HR; Place 1 patch (75 mcg total) onto the skin every 3 (three) days.  Dispense: 10 patch; Refill: 0 - fentaNYL (DURAGESIC) 75 MCG/HR; Place 1 patch (75 mcg total) onto the skin every 3 (three) days.  Dispense: 10 patch; Refill: 0 - HYDROcodone-acetaminophen (NORCO) 7.5-325 MG tablet; Take 1 tablet by mouth every 6 (six) hours as needed for moderate pain.  Dispense: 30 tablet; Refill: 0 - HYDROcodone-acetaminophen (NORCO) 7.5-325 MG tablet; Take 1 tablet by mouth every 6 (six) hours as needed for moderate pain.  Dispense: 30 tablet; Refill: 0  6. Chronic back pain - methylPREDNISolone acetate (DEPO-MEDROL) injection 80 mg; Inject 1 mL (80 mg total) into the muscle once. - predniSONE (STERAPRED UNI-PAK 21 TAB) 10 MG (21) TBPK tablet; Take 1 tablet (10 mg total) by mouth daily. As directed x 6 days  Dispense: 21 tablet; Refill: 0 - HYDROcodone-acetaminophen (NORCO) 7.5-325 MG tablet; Take 1 tablet by mouth daily. As needed for BTP (1 tablet per day)  Dispense: 30 tablet; Refill: 0 - fentaNYL (DURAGESIC) 75 MCG/HR; Place 1 patch (75 mcg total) onto the skin every 3 (three) days.  Dispense: 10 patch; Refill: 0 - fentaNYL (DURAGESIC) 75 MCG/HR; Place 1 patch (75 mcg total) onto the skin every 3 (three) days.  Dispense: 10 patch; Refill: 0 - fentaNYL (DURAGESIC) 75 MCG/HR; Place 1 patch (75 mcg total) onto the skin every 3 (three) days.  Dispense: 10 patch; Refill: 0 - HYDROcodone-acetaminophen (NORCO) 7.5-325 MG tablet; Take 1 tablet by mouth every 6 (six) hours as needed for moderate pain.  Dispense: 30 tablet; Refill: 0 - HYDROcodone-acetaminophen (NORCO) 7.5-325 MG tablet; Take 1 tablet by mouth every 6 (six) hours as needed for moderate pain.  Dispense: 30 tablet; Refill: 0   Continue all meds Labs pending Health Maintenance reviewed Diet and exercise encouraged RTO keep chronic follow up appts!  Evelina Dun, FNP

## 2015-08-02 NOTE — Patient Instructions (Signed)

## 2015-08-08 LAB — TOXASSURE SELECT 13 (MW), URINE: PDF: 0

## 2015-08-09 ENCOUNTER — Other Ambulatory Visit: Payer: Self-pay | Admitting: Family

## 2015-08-14 DIAGNOSIS — J449 Chronic obstructive pulmonary disease, unspecified: Secondary | ICD-10-CM | POA: Diagnosis not present

## 2015-08-16 DIAGNOSIS — E119 Type 2 diabetes mellitus without complications: Secondary | ICD-10-CM | POA: Diagnosis not present

## 2015-08-16 DIAGNOSIS — H47333 Pseudopapilledema of optic disc, bilateral: Secondary | ICD-10-CM | POA: Diagnosis not present

## 2015-08-16 DIAGNOSIS — Q079 Congenital malformation of nervous system, unspecified: Secondary | ICD-10-CM | POA: Diagnosis not present

## 2015-08-16 DIAGNOSIS — Z794 Long term (current) use of insulin: Secondary | ICD-10-CM | POA: Diagnosis not present

## 2015-08-16 DIAGNOSIS — G4733 Obstructive sleep apnea (adult) (pediatric): Secondary | ICD-10-CM | POA: Diagnosis not present

## 2015-08-16 LAB — HM DIABETES EYE EXAM

## 2015-08-22 ENCOUNTER — Other Ambulatory Visit: Payer: Self-pay | Admitting: Family

## 2015-08-30 ENCOUNTER — Encounter: Payer: Self-pay | Admitting: Family

## 2015-08-30 ENCOUNTER — Ambulatory Visit (INDEPENDENT_AMBULATORY_CARE_PROVIDER_SITE_OTHER): Payer: Medicare Other | Admitting: Family

## 2015-08-30 VITALS — BP 134/79 | HR 62 | Temp 98.9°F

## 2015-08-30 DIAGNOSIS — G4733 Obstructive sleep apnea (adult) (pediatric): Secondary | ICD-10-CM

## 2015-08-30 DIAGNOSIS — Z7901 Long term (current) use of anticoagulants: Secondary | ICD-10-CM | POA: Diagnosis not present

## 2015-08-30 DIAGNOSIS — E349 Endocrine disorder, unspecified: Secondary | ICD-10-CM

## 2015-08-30 DIAGNOSIS — K2100 Gastro-esophageal reflux disease with esophagitis, without bleeding: Secondary | ICD-10-CM

## 2015-08-30 DIAGNOSIS — D649 Anemia, unspecified: Secondary | ICD-10-CM

## 2015-08-30 DIAGNOSIS — G43719 Chronic migraine without aura, intractable, without status migrainosus: Secondary | ICD-10-CM

## 2015-08-30 DIAGNOSIS — G40909 Epilepsy, unspecified, not intractable, without status epilepticus: Secondary | ICD-10-CM | POA: Diagnosis not present

## 2015-08-30 DIAGNOSIS — E291 Testicular hypofunction: Secondary | ICD-10-CM | POA: Diagnosis not present

## 2015-08-30 DIAGNOSIS — Z794 Long term (current) use of insulin: Secondary | ICD-10-CM | POA: Diagnosis not present

## 2015-08-30 DIAGNOSIS — M05719 Rheumatoid arthritis with rheumatoid factor of unspecified shoulder without organ or systems involvement: Secondary | ICD-10-CM

## 2015-08-30 DIAGNOSIS — E1165 Type 2 diabetes mellitus with hyperglycemia: Secondary | ICD-10-CM

## 2015-08-30 DIAGNOSIS — Z79899 Other long term (current) drug therapy: Secondary | ICD-10-CM

## 2015-08-30 DIAGNOSIS — Z0289 Encounter for other administrative examinations: Secondary | ICD-10-CM | POA: Insufficient documentation

## 2015-08-30 DIAGNOSIS — E559 Vitamin D deficiency, unspecified: Secondary | ICD-10-CM | POA: Diagnosis not present

## 2015-08-30 DIAGNOSIS — Z125 Encounter for screening for malignant neoplasm of prostate: Secondary | ICD-10-CM

## 2015-08-30 DIAGNOSIS — E785 Hyperlipidemia, unspecified: Secondary | ICD-10-CM | POA: Diagnosis not present

## 2015-08-30 DIAGNOSIS — F411 Generalized anxiety disorder: Secondary | ICD-10-CM

## 2015-08-30 DIAGNOSIS — D696 Thrombocytopenia, unspecified: Secondary | ICD-10-CM

## 2015-08-30 DIAGNOSIS — I82409 Acute embolism and thrombosis of unspecified deep veins of unspecified lower extremity: Secondary | ICD-10-CM

## 2015-08-30 DIAGNOSIS — F1120 Opioid dependence, uncomplicated: Secondary | ICD-10-CM | POA: Diagnosis not present

## 2015-08-30 DIAGNOSIS — Z86711 Personal history of pulmonary embolism: Secondary | ICD-10-CM

## 2015-08-30 DIAGNOSIS — N400 Enlarged prostate without lower urinary tract symptoms: Secondary | ICD-10-CM

## 2015-08-30 DIAGNOSIS — G894 Chronic pain syndrome: Secondary | ICD-10-CM

## 2015-08-30 LAB — POCT GLYCOSYLATED HEMOGLOBIN (HGB A1C): Hemoglobin A1C: 8.2

## 2015-08-30 LAB — POCT INR: INR: 2.2

## 2015-08-30 NOTE — Patient Instructions (Signed)

## 2015-08-30 NOTE — Progress Notes (Signed)
Subjective:    Patient ID: [name], male    DOB: 1966/04/11, 50 y.o.   MRN: [id number]  Pt presents to the office today for chronic follow up.  Diabetes He presents for his follow-up diabetic visit. He has type 2 diabetes mellitus. His disease course has been improving. Hypoglycemia symptoms include seizures. Pertinent negatives for hypoglycemia include no confusion, dizziness or nervousness/anxiousness. Associated symptoms include foot paresthesias. Pertinent negatives for diabetes include no blurred vision, no foot ulcerations and no visual change. There are no hypoglycemic complications. Symptoms are worsening. Diabetic complications include peripheral neuropathy. Pertinent negatives for diabetic complications include no CVA or heart disease. Risk factors for coronary artery disease include diabetes mellitus, dyslipidemia, hypertension, male sex, obesity and sedentary lifestyle. Current diabetic treatment includes insulin injections and oral agent (dual therapy). He is compliant with treatment all of the time. He is following a generally unhealthy diet. His breakfast blood glucose range is generally 180-200 mg/dl. Eye exam is current.  Hyperlipidemia This is a chronic problem. The current episode started more than 1 year ago. The problem is controlled. Recent lipid tests were reviewed and are normal. Exacerbating diseases include diabetes and obesity. He has no history of hypothyroidism. Factors aggravating his hyperlipidemia include fatty foods. Pertinent negatives include no leg pain or myalgias. Current antihyperlipidemic treatment includes statins. The current treatment provides moderate improvement of lipids. Risk factors for coronary artery disease include diabetes mellitus, dyslipidemia, family history, hypertension, obesity, male sex and a sedentary lifestyle.  Gastroesophageal Reflux He reports no heartburn. This is a chronic problem. The current episode started more than 1 year ago.  The problem occurs rarely. The problem has been resolved. The symptoms are aggravated by certain foods. He has tried a PPI for the symptoms. The treatment provided significant relief.  Anxiety Presents for follow-up visit. Onset was more than 5 years ago. Symptoms include depressed mood, excessive worry and insomnia. Patient reports no confusion, dizziness, nervous/anxious behavior or panic. Symptoms occur occasionally. The quality of sleep is good.   His past medical history is significant for depression. There is no history of anxiety/panic attacks. Past treatments include SSRIs. The treatment provided moderate relief.  Benign Prostatic Hypertrophy This is a chronic problem. The current episode started more than 1 year ago. The problem has been resolved since onset. Irritative symptoms do not include nocturia. Obstructive symptoms do not include an intermittent stream or a weak stream. Pertinent negatives include no hematuria. Past treatments include tamsulosin. The treatment provided significant relief.  Seizures  This is a chronic problem. The current episode started more than 1 week ago. Progression since onset: Pt states he usually has seziures at night, and has them every few weeks. Pt states his last one was last week-Pt is followed by neurologists every few months. Pertinent negatives include no confusion.  Migraine  This is a chronic (pt is followed by nuerologists every few month- Pt was getting lidocaine injections) problem. The current episode started more than 1 year ago. The problem occurs intermittently. The problem has been waxing and waning. The pain does not radiate. The pain quality is similar to prior headaches. The quality of the pain is described as aching. The pain is at a severity of 9/10. The pain is moderate. Associated symptoms include insomnia, photophobia and seizures. Pertinent negatives include no blurred vision, dizziness or visual change. His past medical history is  significant for obesity.      Review of Systems  Constitutional: Negative.   HENT: Negative.  Eyes: Positive for photophobia. Negative for blurred vision.  Respiratory: Negative.   Cardiovascular: Negative.   Gastrointestinal: Negative.  Negative for heartburn.  Endocrine: Negative.   Genitourinary: Negative for hematuria and nocturia.  Musculoskeletal: Negative.  Negative for myalgias.  Neurological: Positive for seizures. Negative for dizziness.  Hematological: Negative.   Psychiatric/Behavioral: Negative for confusion. The patient has insomnia. The patient is not nervous/anxious.   All other systems reviewed and are negative.      Objective:   Physical Exam  Constitutional: He is oriented to person, place, and time. He appears well-developed and well-nourished. No distress.  Morbid obese  HENT:  Head: Normocephalic.  Right Ear: External ear normal.  Left Ear: External ear normal.  Nose: Nose normal.  Mouth/Throat: Oropharynx is clear and moist.  Eyes: Pupils are equal, round, and reactive to light. Right eye exhibits no discharge. Left eye exhibits no discharge.  Neck: Normal range of motion. Neck supple. No thyromegaly present.  Cardiovascular: Normal rate, regular rhythm, normal heart sounds and intact distal pulses.   No murmur heard. Pulmonary/Chest: Effort normal and breath sounds normal. No respiratory distress. He has no wheezes.  Abdominal: Soft. Bowel sounds are normal. He exhibits no distension. There is no tenderness.  Musculoskeletal: Normal range of motion. He exhibits edema (trace amt). He exhibits no tenderness.  Pt in electric wheelchair   Neurological: He is alert and oriented to person, place, and time. He has normal reflexes. No cranial nerve deficit.  Skin: Skin is warm and dry. No rash noted. No erythema.  Psychiatric: He has a normal mood and affect. His behavior is normal. Judgment and thought content normal.  Vitals reviewed.     BP 134/79  mmHg  Pulse 62  Temp(Src) 98.9 F (37.2 C) (Oral)  Wt      Assessment & Plan:  1. Pain medication agreement signed - CMP14+EGFR  2. DVT (deep venous thrombosis), unspecified laterality - CMP14+EGFR  3. 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 OSA (obstructive sleep apnea) - CMP14+EGFR  5. Gastroesophageal reflux disease with esophagitis - CMP14+EGFR  6. Type 2 diabetes mellitus with hyperglycemia, with long-term current use of insulin (HCC) - POCT glycosylated hemoglobin (Hb A1C) - CMP14+EGFR - Microalbumin / creatinine urine ratio  7. Seizure disorder (HCC) - CMP14+EGFR  8. 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 Morbid obesity, unspecified obesity type ([place]) - CMP14+EGFR  10. Uncomplicated opioid dependence ([place])  - CMP14+EGFR  11. Hyperlipidemia - CMP14+EGFR - Lipid panel  12. Testosterone deficiency  - CMP14+EGFR  13. Vitamin D deficiency - CMP14+EGFR - VITAMIN D 25 Hydroxy (Vit-D Deficiency, Fractures) - Testosterone,Free and Total  14. GAD (generalized anxiety disorder)  - CMP14+EGFR  15. Chronic pain syndrome - CMP14+EGFR  16. Thrombocytopenia ([place]) - CMP14+EGFR - Anemia Profile B  17. Anemia, unspecified anemia type  - CMP14+EGFR  18. BPH (benign prostatic hyperplasia) - CMP14+EGFR  19. Chronic anticoagulation - CMP14+EGFR  20. History of pulmonary embolism  - CMP14+EGFR  21. Prostate cancer screening  - CMP14+EGFR   Continue all meds Labs pending Health Maintenance reviewed Diet and exercise encouraged RTO 3 months  Evelina Dun, FNP

## 2015-08-30 NOTE — Addendum Note (Signed)
Addended by: Earlene Plater on: 08/30/2015 12:18 PM   Modules accepted: Orders

## 2015-08-31 LAB — MICROALBUMIN / CREATININE URINE RATIO
Creatinine, Urine: 51.4 mg/dL
MICROALB/CREAT RATIO: 339.3 mg/g creat — ABNORMAL HIGH (ref 0.0–30.0)
Microalbumin, Urine: 174.4 ug/mL

## 2015-09-01 LAB — LIPID PANEL
Chol/HDL Ratio: 2.9 ratio units (ref 0.0–5.0)
Cholesterol, Total: 137 mg/dL (ref 100–199)
HDL: 47 mg/dL (ref >39)
LDL Calculated: 61 mg/dL (ref 0–99)
Triglycerides: 144 mg/dL (ref 0–149)
VLDL Cholesterol Cal: 29 mg/dL (ref 5–40)

## 2015-09-01 LAB — CMP14+EGFR
ALT: 29 IU/L (ref 0–44)
AST: 30 IU/L (ref 0–40)
Albumin/Globulin Ratio: 1.1 (ref 1.1–2.5)
Albumin: 3.5 g/dL (ref 3.5–5.5)
Alkaline Phosphatase: 110 IU/L (ref 39–117)
BUN/Creatinine Ratio: 22 — ABNORMAL HIGH (ref 9–20)
BUN: 36 mg/dL — ABNORMAL HIGH (ref 6–24)
Bilirubin Total: 0.6 mg/dL (ref 0.0–1.2)
CO2: 29 mmol/L (ref 18–29)
Calcium: 8.6 mg/dL — ABNORMAL LOW (ref 8.7–10.2)
Chloride: 95 mmol/L — ABNORMAL LOW (ref 96–106)
Creatinine, Ser: 1.61 mg/dL — ABNORMAL HIGH (ref 0.76–1.27)
GFR calc Af Amer: 58 mL/min/{1.73_m2} — ABNORMAL LOW (ref >59)
GFR calc non Af Amer: 50 mL/min/{1.73_m2} — ABNORMAL LOW (ref >59)
Globulin, Total: 3.1 g/dL (ref 1.5–4.5)
Glucose: 207 mg/dL — ABNORMAL HIGH (ref 65–99)
Potassium: 4.8 mmol/L (ref 3.5–5.2)
Sodium: 138 mmol/L (ref 134–144)
Total Protein: 6.6 g/dL (ref 6.0–8.5)

## 2015-09-01 LAB — ANEMIA PROFILE B
Basophils Absolute: 0 10*3/uL (ref 0.0–0.2)
Basos: 1 %
EOS (ABSOLUTE): 0.2 10*3/uL (ref 0.0–0.4)
Eos: 4 %
Ferritin: 81 ng/mL (ref 30–400)
Folate: 13 ng/mL (ref >3.0)
Hematocrit: 43.2 % (ref 37.5–51.0)
Hemoglobin: 13.9 g/dL (ref 12.6–17.7)
Immature Grans (Abs): 0 10*3/uL (ref 0.0–0.1)
Immature Granulocytes: 0 %
Iron Saturation: 25 % (ref 15–55)
Iron: 67 ug/dL (ref 38–169)
Lymphocytes Absolute: 1.6 10*3/uL (ref 0.7–3.1)
Lymphs: 27 %
MCH: 28.4 pg (ref 26.6–33.0)
MCHC: 32.2 g/dL (ref 31.5–35.7)
MCV: 88 fL (ref 79–97)
Monocytes Absolute: 0.6 10*3/uL (ref 0.1–0.9)
Monocytes: 10 %
Neutrophils Absolute: 3.6 10*3/uL (ref 1.4–7.0)
Neutrophils: 58 %
Platelets: 124 10*3/uL — ABNORMAL LOW (ref 150–379)
RBC: 4.9 x10E6/uL (ref 4.14–5.80)
RDW: 15.6 % — ABNORMAL HIGH (ref 12.3–15.4)
Retic Ct Pct: 1.8 % (ref 0.6–2.6)
Total Iron Binding Capacity: 273 ug/dL (ref 250–450)
UIBC: 206 ug/dL (ref 111–343)
Vitamin B-12: 1241 pg/mL — ABNORMAL HIGH (ref 211–946)
WBC: 6.1 10*3/uL (ref 3.4–10.8)

## 2015-09-01 LAB — TESTOSTERONE,FREE AND TOTAL
Testosterone, Free: 1.3 pg/mL — ABNORMAL LOW (ref 6.8–21.5)
Testosterone: 88 ng/dL — ABNORMAL LOW (ref 348–1197)

## 2015-09-01 LAB — VITAMIN D 25 HYDROXY (VIT D DEFICIENCY, FRACTURES): Vit D, 25-Hydroxy: 23.9 ng/mL — ABNORMAL LOW (ref 30.0–100.0)

## 2015-09-02 ENCOUNTER — Other Ambulatory Visit: Payer: Self-pay | Admitting: Family

## 2015-09-02 MED ORDER — DULAGLUTIDE 0.75 MG/0.5ML ~~LOC~~ SOAJ: 0.7500 "pen " | SUBCUTANEOUS | Status: DC

## 2015-09-03 ENCOUNTER — Other Ambulatory Visit: Payer: Self-pay | Admitting: Neurology

## 2015-09-03 MED ORDER — VITAMIN D (ERGOCALCIFEROL) 1.25 MG (50000 UNIT) PO CAPS: ORAL_CAPSULE | ORAL | Status: DC

## 2015-09-03 NOTE — Addendum Note (Signed)
Addended by: Shelbie Ammons on: 09/03/2015 09:14 AM   Modules accepted: Orders

## 2015-09-11 DIAGNOSIS — J449 Chronic obstructive pulmonary disease, unspecified: Secondary | ICD-10-CM | POA: Diagnosis not present

## 2015-09-15 ENCOUNTER — Other Ambulatory Visit: Payer: Self-pay | Admitting: Family

## 2015-09-17 ENCOUNTER — Other Ambulatory Visit: Payer: Self-pay

## 2015-09-17 MED ORDER — INSULIN LISPRO 100 UNIT/ML (KWIKPEN): 35.0000 [IU] | PEN_INJECTOR | Freq: Three times a day (TID) | SUBCUTANEOUS | Status: DC

## 2015-09-19 ENCOUNTER — Other Ambulatory Visit: Payer: Self-pay | Admitting: Family

## 2015-09-24 ENCOUNTER — Other Ambulatory Visit: Payer: Self-pay | Admitting: Family

## 2015-09-26 ENCOUNTER — Encounter: Payer: Self-pay | Admitting: Family Medicine

## 2015-09-26 ENCOUNTER — Ambulatory Visit (INDEPENDENT_AMBULATORY_CARE_PROVIDER_SITE_OTHER): Payer: Medicare Other | Admitting: Family Medicine

## 2015-09-26 VITALS — BP 123/74 | HR 76 | Temp 99.0°F

## 2015-09-26 DIAGNOSIS — R05 Cough: Secondary | ICD-10-CM | POA: Diagnosis not present

## 2015-09-26 DIAGNOSIS — R059 Cough, unspecified: Secondary | ICD-10-CM

## 2015-09-26 LAB — VERITOR FLU A/B WAIVED
Influenza A: NEGATIVE
Influenza B: NEGATIVE

## 2015-09-26 MED ORDER — HYDROCODONE-HOMATROPINE 5-1.5 MG/5ML PO SYRP: 5.0000 mL | ORAL_SOLUTION | Freq: Three times a day (TID) | ORAL | Status: DC | PRN

## 2015-09-26 MED ORDER — AZITHROMYCIN 250 MG PO TABS: ORAL_TABLET | ORAL | Status: DC

## 2015-09-26 NOTE — Progress Notes (Signed)
Subjective:    Patient ID: [name], male    DOB: [birth year], 50 y.o.   MRN: [id number]  HPI 50 year old man with cough myalgias low-grade fever sore throat. Coughhas been slightly productive.  Patient Active Problem List   Diagnosis Date Noted  . Pain medication agreement signed 08/30/2015  . RA (rheumatoid arthritis) ([place]) 08/02/2015  . Opioid dependence ([place]) 08/02/2015  . Hyperlipidemia 05/31/2015  . Other vascular headache 05/03/2015  . Vitamin D deficiency 11/23/2014  . Testosterone deficiency 11/23/2014  . Intractable chronic migraine without aura 10/02/2014  . GAD (generalized anxiety disorder) 01/24/2014  . GERD (gastroesophageal reflux disease) 01/24/2014  . BPH (benign prostatic hyperplasia) 01/24/2014  . Postoperative pulmonary edema ([place]) 09/15/2012  . DVT (deep venous thrombosis), unspecified laterality 09/15/2012  . Anemia 05/11/2012  . Thrombocytopenia ([place]) 05/11/2012  . Acute renal failure ([place]) 05/11/2012  . Chronic pain syndrome 05/11/2012  . Acute respiratory failure ([place]) 05/10/2012  . Morbid obesity ([place]) 05/10/2012  . OSA (obstructive sleep apnea) 05/10/2012  . Obesity hypoventilation syndrome ([place]) 05/10/2012  . DM2 (diabetes mellitus, type 2) ([place]) 05/10/2012  . History of pulmonary embolism 05/10/2012  . Chronic anticoagulation 05/10/2012  . Seizure disorder ([place]) 05/10/2012  . DYSPNEA 02/05/2009   Outpatient Encounter Prescriptions as of 09/26/2015  Medication Sig  . B-D ULTRAFINE III SHORT PEN 31G X 8 MM MISC INJECT 1 PEN INTO THE SKIN 5 (FIVE) TIMES DAILY.  . busPIRone (BUSPAR) 15 MG tablet TAKE 1 TABLET (15 MG TOTAL) BY MOUTH 2 (TWO) TIMES DAILY.  . citalopram (CELEXA) 20 MG tablet TAKE 3 TABLETS BY MOUTH AT BEDTIME  . cyclobenzaprine (FLEXERIL) 10 MG tablet Take 1/2 to 1 tablet by mouth as needed at bedtime for muscle cramps / spasms  . diazepam (VALIUM) 5 MG tablet TAKE 1 TABLET BY MOUTH EVERY 12 HOURS AS NEEDED FOR ANXIETY  .  Dulaglutide (TRULICITY) A999333 0000000 SOPN Inject 0.75 pens into the skin once a week.  . EPIPEN 2-PAK 0.3 MG/0.3ML SOAJ injection INJECT 0.3 MLS (0.3 MG TOTAL) INTO THE MUSCLE ONCE. AS NEEDED FOR ANAPHYLACTIC REACTION  . esomeprazole (NEXIUM) 40 MG capsule TAKE 1 CAPSULE (40 MG TOTAL) BY MOUTH DAILY BEFORE BREAKFAST.  . fentaNYL (DURAGESIC) 75 MCG/HR Place 1 patch (75 mcg total) onto the skin every 3 (three) days.  . fentaNYL (DURAGESIC) 75 MCG/HR Place 1 patch (75 mcg total) onto the skin every 3 (three) days.  . fentaNYL (DURAGESIC) 75 MCG/HR Place 1 patch (75 mcg total) onto the skin every 3 (three) days.  . fluticasone (FLONASE) 50 MCG/ACT nasal spray Place 1 spray into both nostrils 2 (two) times daily.  . furosemide (LASIX) 80 MG tablet Take 1 tablet (80 mg total) by mouth 2 (two) times daily.  Marland Kitchen glucose blood (ACCU-CHEK COMPACT PLUS) test strip 1 each by Other route 4 (four) times daily.  Marland Kitchen HYDROcodone-acetaminophen (NORCO) 7.5-325 MG tablet Take 1 tablet by mouth daily. As needed for BTP (1 tablet per day)  . HYDROcodone-acetaminophen (NORCO) 7.5-325 MG tablet Take 1 tablet by mouth every 6 (six) hours as needed for moderate pain.  Marland Kitchen HYDROcodone-acetaminophen (NORCO) 7.5-325 MG tablet Take 1 tablet by mouth every 6 (six) hours as needed for moderate pain.  Marland Kitchen insulin lispro (HUMALOG KWIKPEN) 100 UNIT/ML KiwkPen Inject 0.35 mLs (35 Units total) into the skin 3 (three) times daily with meals.  . Insulin Syringes, Disposable, U-100 1 ML MISC Use to inject insulin as directed up to qid Dx 250.02 - adult  onset DM now requiring multiple daily injections for control  . INVOKANA 100 MG TABS tablet TAKE 1 TABLET BY MOUTH EVERY DAY  . KLOR-CON M10 10 MEQ tablet TAKE 3 TABLETS (30 MEQ TOTAL) BY MOUTH DAILY.  Marland Kitchen lamoTRIgine (LAMICTAL) 200 MG tablet TAKE 1 TABLET (200 MG TOTAL) BY MOUTH 2 (TWO) TIMES DAILY.  Marland Kitchen levETIRAcetam (KEPPRA) 1000 MG tablet Take 1 tablet (1,000 mg total) by mouth 2 (two) times  daily.  . Multiple Vitamin (MULTIVITAMIN WITH MINERALS) TABS Take 1 tablet by mouth daily.  . naftifine (NAFTIN) 1 % cream Apply topically daily.  . pregabalin (LYRICA) 50 MG capsule Take 1 capsule (50 mg total) by mouth 3 (three) times daily.  . rizatriptan (MAXALT) 10 MG tablet TAKE 1 TABLET BY MOUTH AT ONSET OF MIGRAINS,MAY REPEAT IN 2HRS MAX 2TABS PER DAY OR 2 DAYS A WEEK  . simvastatin (ZOCOR) 20 MG tablet TAKE 1 TABLET (20 MG TOTAL) BY MOUTH AT BEDTIME.  . tamsulosin (FLOMAX) 0.4 MG CAPS capsule TAKE 1 CAPSULE (0.4 MG TOTAL) BY MOUTH DAILY.  Marland Kitchen testosterone (ANDROGEL) 50 MG/5GM (1%) GEL Place 10 g onto the skin daily.  . TOUJEO SOLOSTAR 300 UNIT/ML SOPN INJECT 190 UNITS INTO THE SKIN EVERY MORNING.  . Vitamin D, Ergocalciferol, (DRISDOL) 50000 units CAPS capsule TAKE 1 CAPSULE (50,000 UNITS TOTAL) BY MOUTH EVERY 7 (SEVEN) DAYS.  Marland Kitchen warfarin (COUMADIN) 5 MG tablet TAKE 1 TO 1&1/2 TABLETS BY MOUTH DAILY AS DIRECTED BY ANTI-COAG CLINIC  . zolpidem (AMBIEN) 5 MG tablet   . [DISCONTINUED] Insulin Glargine (TOUJEO SOLOSTAR) 300 UNIT/ML SOPN Inject 190 Units into the skin every morning.   No facility-administered encounter medications on file as of 09/26/2015.      Review of Systems  Constitutional: Positive for fever.  HENT: Positive for sore throat.   Respiratory: Positive for cough.   Musculoskeletal: Positive for myalgias.       Objective:   Physical Exam  Constitutional: He appears well-developed and well-nourished.  HENT:  Mouth/Throat: Oropharynx is clear and moist.  Cardiovascular: Normal rate and regular rhythm.   Pulmonary/Chest: Effort normal and breath sounds normal.  Neurological: He is alert.          Assessment & Plan:  1. Cough Flu tests are negative. Probable viral syndrome plan treat with Hycodan cough syrup. He artery has hydrocodone that he takes for breakthrough pain. I take Tylenol for myalgias and drink lots of fluid  Wardell Honour MDs. - [facility]  A/B [place]

## 2015-10-12 DIAGNOSIS — J449 Chronic obstructive pulmonary disease, unspecified: Secondary | ICD-10-CM | POA: Diagnosis not present

## 2015-10-13 ENCOUNTER — Other Ambulatory Visit: Payer: Self-pay | Admitting: Family

## 2015-10-14 ENCOUNTER — Encounter: Payer: Medicare Other | Admitting: Pharmacist

## 2015-10-16 ENCOUNTER — Encounter: Payer: Self-pay | Admitting: Family

## 2015-10-17 ENCOUNTER — Other Ambulatory Visit: Payer: Self-pay | Admitting: Family

## 2015-10-17 MED ORDER — CYCLOBENZAPRINE HCL 10 MG PO TABS: ORAL_TABLET | ORAL | Status: DC

## 2015-10-25 ENCOUNTER — Ambulatory Visit (INDEPENDENT_AMBULATORY_CARE_PROVIDER_SITE_OTHER): Payer: Medicare Other | Admitting: Pharmacist

## 2015-10-25 DIAGNOSIS — Z794 Long term (current) use of insulin: Secondary | ICD-10-CM

## 2015-10-25 DIAGNOSIS — J810 Acute pulmonary edema: Secondary | ICD-10-CM | POA: Diagnosis not present

## 2015-10-25 DIAGNOSIS — I82409 Acute embolism and thrombosis of unspecified deep veins of unspecified lower extremity: Secondary | ICD-10-CM | POA: Diagnosis not present

## 2015-10-25 DIAGNOSIS — E1165 Type 2 diabetes mellitus with hyperglycemia: Secondary | ICD-10-CM

## 2015-10-25 LAB — COAGUCHEK XS/INR WAIVED
INR: 2.9 — ABNORMAL HIGH (ref 0.9–1.1)
Prothrombin Time: 34.4 s

## 2015-10-25 MED ORDER — ACCU-CHEK COMPACT PLUS CARE KIT: PACK | Status: DC

## 2015-10-25 MED ORDER — DULAGLUTIDE 1.5 MG/0.5ML ~~LOC~~ SOAJ: 1.5000 mg | SUBCUTANEOUS | Status: DC

## 2015-10-25 NOTE — Progress Notes (Signed)
Subjective:     Indication: DVT Bleeding signs/symptoms: None Thromboembolic signs/symptoms: None  Missed Coumadin doses: None Medication changes: yes - trulicity A999333 q week started about 6 to 7 weeks.  Is tolerating well and BG was initially decreasing but has has more readings in the low 200's over the last 2 weeks.  Dietary changes: no Bacterial/viral infection: no Other concerns: yes - elevated BG  The following portions of the patient's history were reviewed and updated as appropriate: allergies, current medications and problem list.   Objective:    INR Today: 2.9 RBG was 181 today  Last A1c = 8.1%    Assessment:    Therapeutic INR for goal of 2-3   Uncontrolled type 2 DM - insulin dep  Plan:    1. New dose: no change   2. Next INR: 1 month   3.  Increase trulicity to 1.5mg  sq weekly 4. Patient also requested new glucometer - rx sent pharmacy

## 2015-10-25 NOTE — Patient Instructions (Signed)
Anticoagulation Dose Instructions as of 10/25/2015      Dorene Grebe Tue Wed Thu Fri Sat   New Dose 5 mg 7.5 mg 5 mg 7.5 mg 5 mg 7.5 mg 5 mg    Description        Continue current warfarin dose of 5mg  - take 1 and 1/2 tablets on Monday, wednesdays and fridays.  Take 1 tablet all other days.       INR was 2.9 today

## 2015-10-28 ENCOUNTER — Other Ambulatory Visit: Payer: Self-pay | Admitting: *Deleted

## 2015-10-28 MED ORDER — ACCU-CHEK COMPACT PLUS CARE KIT: PACK | Status: DC

## 2015-10-29 ENCOUNTER — Other Ambulatory Visit: Payer: Self-pay | Admitting: *Deleted

## 2015-10-29 ENCOUNTER — Telehealth: Payer: Self-pay | Admitting: Pharmacist

## 2015-10-29 DIAGNOSIS — G8929 Other chronic pain: Secondary | ICD-10-CM

## 2015-10-29 DIAGNOSIS — G894 Chronic pain syndrome: Secondary | ICD-10-CM

## 2015-10-29 DIAGNOSIS — M549 Dorsalgia, unspecified: Secondary | ICD-10-CM

## 2015-10-29 DIAGNOSIS — M05719 Rheumatoid arthritis with rheumatoid factor of unspecified shoulder without organ or systems involvement: Secondary | ICD-10-CM

## 2015-10-29 MED ORDER — GLUCOSE BLOOD VI STRP: ORAL_STRIP | Status: DC

## 2015-10-30 NOTE — Telephone Encounter (Signed)
Patient is requesting Rx for fentanyl patches and hydrocodone to last until next appt 12/03/15.  Was given 3 Rx's 08/02/15.  Will forward to PCP.

## 2015-10-31 MED ORDER — FENTANYL 75 MCG/HR TD PT72: 75.0000 ug | MEDICATED_PATCH | TRANSDERMAL | Status: DC

## 2015-10-31 MED ORDER — HYDROCODONE-ACETAMINOPHEN 7.5-325 MG PO TABS: 1.0000 | ORAL_TABLET | Freq: Every day | ORAL | Status: DC

## 2015-10-31 NOTE — Telephone Encounter (Signed)
Pts wife is aware 

## 2015-10-31 NOTE — Telephone Encounter (Signed)
RX ready for pick up. This should put [place] on schedule to have pain medication line up with chronic follow up appts

## 2015-11-04 ENCOUNTER — Other Ambulatory Visit: Payer: Self-pay | Admitting: Family

## 2015-11-07 ENCOUNTER — Other Ambulatory Visit: Payer: Self-pay | Admitting: Family

## 2015-11-11 DIAGNOSIS — J449 Chronic obstructive pulmonary disease, unspecified: Secondary | ICD-10-CM | POA: Diagnosis not present

## 2015-11-24 ENCOUNTER — Other Ambulatory Visit: Payer: Self-pay | Admitting: Family

## 2015-11-26 NOTE — Telephone Encounter (Signed)
Last vitamin d 08/2015

## 2015-12-03 ENCOUNTER — Encounter: Payer: Self-pay | Admitting: Family

## 2015-12-03 ENCOUNTER — Ambulatory Visit (INDEPENDENT_AMBULATORY_CARE_PROVIDER_SITE_OTHER): Payer: Medicare Other | Admitting: Family

## 2015-12-03 VITALS — BP 124/82 | HR 76 | Temp 98.0°F | Ht 72.0 in | Wt >= 6400 oz

## 2015-12-03 DIAGNOSIS — G40909 Epilepsy, unspecified, not intractable, without status epilepticus: Secondary | ICD-10-CM | POA: Diagnosis not present

## 2015-12-03 DIAGNOSIS — M549 Dorsalgia, unspecified: Secondary | ICD-10-CM

## 2015-12-03 DIAGNOSIS — N400 Enlarged prostate without lower urinary tract symptoms: Secondary | ICD-10-CM | POA: Diagnosis not present

## 2015-12-03 DIAGNOSIS — G8929 Other chronic pain: Secondary | ICD-10-CM

## 2015-12-03 DIAGNOSIS — E785 Hyperlipidemia, unspecified: Secondary | ICD-10-CM

## 2015-12-03 DIAGNOSIS — F411 Generalized anxiety disorder: Secondary | ICD-10-CM

## 2015-12-03 DIAGNOSIS — Z7901 Long term (current) use of anticoagulants: Secondary | ICD-10-CM | POA: Diagnosis not present

## 2015-12-03 DIAGNOSIS — G894 Chronic pain syndrome: Secondary | ICD-10-CM

## 2015-12-03 DIAGNOSIS — K2100 Gastro-esophageal reflux disease with esophagitis, without bleeding: Secondary | ICD-10-CM

## 2015-12-03 DIAGNOSIS — Z79899 Other long term (current) drug therapy: Secondary | ICD-10-CM

## 2015-12-03 DIAGNOSIS — Z86711 Personal history of pulmonary embolism: Secondary | ICD-10-CM | POA: Diagnosis not present

## 2015-12-03 DIAGNOSIS — G43719 Chronic migraine without aura, intractable, without status migrainosus: Secondary | ICD-10-CM

## 2015-12-03 DIAGNOSIS — I82409 Acute embolism and thrombosis of unspecified deep veins of unspecified lower extremity: Secondary | ICD-10-CM

## 2015-12-03 DIAGNOSIS — E1165 Type 2 diabetes mellitus with hyperglycemia: Secondary | ICD-10-CM

## 2015-12-03 DIAGNOSIS — J810 Acute pulmonary edema: Secondary | ICD-10-CM

## 2015-12-03 DIAGNOSIS — E349 Endocrine disorder, unspecified: Secondary | ICD-10-CM

## 2015-12-03 DIAGNOSIS — G4733 Obstructive sleep apnea (adult) (pediatric): Secondary | ICD-10-CM

## 2015-12-03 DIAGNOSIS — M05719 Rheumatoid arthritis with rheumatoid factor of unspecified shoulder without organ or systems involvement: Secondary | ICD-10-CM

## 2015-12-03 DIAGNOSIS — Z0289 Encounter for other administrative examinations: Secondary | ICD-10-CM

## 2015-12-03 DIAGNOSIS — D696 Thrombocytopenia, unspecified: Secondary | ICD-10-CM | POA: Diagnosis not present

## 2015-12-03 DIAGNOSIS — E291 Testicular hypofunction: Secondary | ICD-10-CM

## 2015-12-03 DIAGNOSIS — Z794 Long term (current) use of insulin: Secondary | ICD-10-CM

## 2015-12-03 DIAGNOSIS — E559 Vitamin D deficiency, unspecified: Secondary | ICD-10-CM

## 2015-12-03 DIAGNOSIS — D649 Anemia, unspecified: Secondary | ICD-10-CM | POA: Diagnosis not present

## 2015-12-03 DIAGNOSIS — F1120 Opioid dependence, uncomplicated: Secondary | ICD-10-CM

## 2015-12-03 LAB — COAGUCHEK XS/INR WAIVED
INR: 2.5 — ABNORMAL HIGH (ref 0.9–1.1)
Prothrombin Time: 30 s

## 2015-12-03 LAB — BAYER DCA HB A1C WAIVED: HB A1C (BAYER DCA - WAIVED): 8 % — ABNORMAL HIGH (ref <7.0)

## 2015-12-03 MED ORDER — FENTANYL 75 MCG/HR TD PT72: 75.0000 ug | MEDICATED_PATCH | TRANSDERMAL | Status: DC

## 2015-12-03 MED ORDER — BUSPIRONE HCL 15 MG PO TABS: ORAL_TABLET | ORAL | Status: DC

## 2015-12-03 MED ORDER — DULAGLUTIDE 1.5 MG/0.5ML ~~LOC~~ SOAJ: 1.5000 mg | SUBCUTANEOUS | Status: DC

## 2015-12-03 MED ORDER — DIAZEPAM 5 MG PO TABS: 5.0000 mg | ORAL_TABLET | Freq: Two times a day (BID) | ORAL | Status: DC | PRN

## 2015-12-03 MED ORDER — HYDROCODONE-ACETAMINOPHEN 7.5-325 MG PO TABS: 1.0000 | ORAL_TABLET | Freq: Four times a day (QID) | ORAL | Status: DC | PRN

## 2015-12-03 MED ORDER — ZOLPIDEM TARTRATE 5 MG PO TABS
5.0000 mg | ORAL_TABLET | Freq: Every day | ORAL | Status: DC
Start: 2015-12-03 — End: 2016-03-06

## 2015-12-03 MED ORDER — HYDROCODONE-ACETAMINOPHEN 7.5-325 MG PO TABS: 1.0000 | ORAL_TABLET | Freq: Every day | ORAL | Status: DC

## 2015-12-03 MED ORDER — CITALOPRAM HYDROBROMIDE 20 MG PO TABS: 60.0000 mg | ORAL_TABLET | Freq: Every day | ORAL | Status: DC

## 2015-12-03 NOTE — Patient Instructions (Signed)

## 2015-12-03 NOTE — Progress Notes (Signed)
Subjective:    Patient ID: [name], male    DOB: 04-20-1966, 50 y.o.   MRN: [id number]  Pt presents to the office today for chronic follow up. Pt is followed by neurologists every 6 months for migraines and seizures. Pt is followed by nephrologists annually.  Diabetes He presents for his follow-up diabetic visit. He has type 2 diabetes mellitus. His disease course has been improving. Hypoglycemia symptoms include seizures. Pertinent negatives for hypoglycemia include no confusion, dizziness or nervousness/anxiousness. Associated symptoms include foot paresthesias. Pertinent negatives for diabetes include no blurred vision, no foot ulcerations and no visual change. There are no hypoglycemic complications. Symptoms are worsening. Diabetic complications include peripheral neuropathy. Pertinent negatives for diabetic complications include no CVA or heart disease. Risk factors for coronary artery disease include diabetes mellitus, dyslipidemia, hypertension, male sex, obesity and sedentary lifestyle. Current diabetic treatment includes insulin injections and oral agent (dual therapy). He is compliant with treatment all of the time. He is following a generally unhealthy diet. His breakfast blood glucose range is generally 130-140 mg/dl. Eye exam is current.  Hyperlipidemia This is a chronic problem. The current episode started more than 1 year ago. The problem is controlled. Recent lipid tests were reviewed and are normal. Exacerbating diseases include diabetes and obesity. He has no history of hypothyroidism. Factors aggravating his hyperlipidemia include fatty foods. Pertinent negatives include no leg pain or myalgias. Current antihyperlipidemic treatment includes statins. The current treatment provides moderate improvement of lipids. Risk factors for coronary artery disease include diabetes mellitus, dyslipidemia, family history, hypertension, obesity, male sex and a sedentary lifestyle.    Gastroesophageal Reflux He reports no coughing, no heartburn, no sore throat or no wheezing. This is a chronic problem. The current episode started more than 1 year ago. The problem occurs rarely. The problem has been resolved. The symptoms are aggravated by certain foods. He has tried a PPI for the symptoms. The treatment provided significant relief.  Anxiety Presents for follow-up visit. Onset was more than 5 years ago. Symptoms include depressed mood, excessive worry and insomnia. Patient reports no confusion, dizziness, nervous/anxious behavior or panic. Symptoms occur occasionally. The quality of sleep is good.   His past medical history is significant for depression. There is no history of anxiety/panic attacks. Past treatments include SSRIs. The treatment provided moderate relief.  Benign Prostatic Hypertrophy This is a chronic problem. The current episode started more than 1 year ago. The problem has been resolved since onset. Irritative symptoms do not include nocturia. Obstructive symptoms do not include an intermittent stream or a weak stream. Pertinent negatives include no hematuria. Past treatments include tamsulosin. The treatment provided significant relief.  Seizures  This is a chronic problem. The current episode started more than 1 week ago. Progression since onset: Pt states he usually has seziures at night, and has them every few weeks. Pt states his last one was last week-Pt is followed by neurologists every few months. Pertinent negatives include no confusion, no sore throat and no cough.  Migraine  This is a chronic (pt is followed by nuerologists every few month- Pt was getting lidocaine injections) problem. The current episode started more than 1 year ago. The problem occurs intermittently. The problem has been waxing and waning. The pain does not radiate. The pain quality is similar to prior headaches. The quality of the pain is described as aching. The pain is at a severity of  9/10. The pain is moderate. Associated symptoms include insomnia, photophobia and seizures.  Pertinent negatives include no blurred vision, coughing, dizziness, sore throat or visual change. His past medical history is significant for obesity.      Review of Systems  Constitutional: Negative.   HENT: Negative.  Negative for sore throat.   Eyes: Positive for photophobia. Negative for blurred vision.  Respiratory: Negative.  Negative for cough and wheezing.   Cardiovascular: Negative.   Gastrointestinal: Negative.  Negative for heartburn.  Endocrine: Negative.   Genitourinary: Negative for hematuria and nocturia.  Musculoskeletal: Negative.  Negative for myalgias.  Neurological: Positive for seizures. Negative for dizziness.  Hematological: Negative.   Psychiatric/Behavioral: Negative for confusion. The patient has insomnia. The patient is not nervous/anxious.   All other systems reviewed and are negative.      Objective:   Physical Exam  Constitutional: He is oriented to person, place, and time. He appears well-developed and well-nourished. No distress.  Morbid obese  HENT:  Head: Normocephalic.  Right Ear: External ear normal.  Left Ear: External ear normal.  Nose: Nose normal.  Mouth/Throat: Oropharynx is clear and moist.  Eyes: Pupils are equal, round, and reactive to light. Right eye exhibits no discharge. Left eye exhibits no discharge.  Neck: Normal range of motion. Neck supple. No thyromegaly present.  Cardiovascular: Normal rate, regular rhythm, normal heart sounds and intact distal pulses.   No murmur heard. Pulmonary/Chest: Effort normal and breath sounds normal. No respiratory distress. He has no wheezes.  Abdominal: Soft. Bowel sounds are normal. He exhibits no distension. There is no tenderness.  Musculoskeletal: Normal range of motion. He exhibits edema (trace amt). He exhibits no tenderness.  Pt in electric wheelchair   Neurological: He is alert and oriented to  person, place, and time. He has normal reflexes. No cranial nerve deficit.  Skin: Skin is warm and dry. No rash noted. No erythema.  Psychiatric: He has a normal mood and affect. His behavior is normal. Judgment and thought content normal.  Vitals reviewed.     BP 124/82 mmHg  Pulse 76  Temp(Src) 98 F (36.7 C) (Oral)  Ht 6' (1.829 m)  Wt 425 lb (192.779 kg)  BMI 57.63 kg/m2     Assessment & Plan:  1. Intractable chronic migraine without aura and without status migrainosus - CMP14+EGFR  2. OSA (obstructive sleep apnea) - CMP14+EGFR  3. Gastroesophageal reflux disease with esophagitis - CMP14+EGFR  4. Type 2 diabetes mellitus with hyperglycemia, with long-term current use of insulin (HCC) - Bayer DCA Hb A1c Waived - CMP14+EGFR - Dulaglutide (TRULICITY) 1.5 WC/3.7SE SOPN; Inject 1.5 mg into the skin once a week.  Dispense: 4 pen; Refill: 1  5. Seizure disorder (HCC) - CMP14+EGFR  6. 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  Dispense: 10 patch; Refill: 0 - fentaNYL (DURAGESIC) 75 MCG/HR; Place 1 patch (75 mcg total) onto the skin every 3 (three) days.  Dispense: 10 patch; Refill: 0 - fentaNYL (DURAGESIC) 75 MCG/HR; Place 1 patch (75 mcg total) onto the skin every 3 (three) days.  Dispense: 10 patch; Refill: 0 - HYDROcodone-acetaminophen (NORCO) 7.5-325 MG tablet; Take 1 tablet by mouth every 6 (six) hours as needed for moderate pain.  Dispense: 30 tablet; Refill: 0 - HYDROcodone-acetaminophen (NORCO) 7.5-325 MG tablet; Take 1 tablet by mouth every 6 (six) hours as needed for moderate pain.  Dispense: 30 tablet; Refill: 0  7. BPH (benign prostatic  hyperplasia) - CMP14+EGFR  8. Morbid obesity, unspecified obesity type ([place]) - CMP14+EGFR  9. History of pulmonary embolism - CMP14+EGFR  10. Chronic  anticoagulation - CMP14+EGFR  11. Anemia, unspecified anemia type - CMP14+EGFR  12. Thrombocytopenia ([place]) - CMP14+EGFR  13. Chronic pain syndrome - CMP14+EGFR - fentaNYL (DURAGESIC) 75 MCG/HR; Place 1 patch (75 mcg total) onto the skin every 3 (three) days.  Dispense: 10 patch; Refill: 0 - fentaNYL (DURAGESIC) 75 MCG/HR; Place 1 patch (75 mcg total) onto the skin every 3 (three) days.  Dispense: 10 patch; Refill: 0 - fentaNYL (DURAGESIC) 75 MCG/HR; Place 1 patch (75 mcg total) onto the skin every 3 (three) days.  Dispense: 10 patch; Refill: 0 - HYDROcodone-acetaminophen (NORCO) 7.5-325 MG tablet; Take 1 tablet by mouth every 6 (six) hours as needed for moderate pain.  Dispense: 30 tablet; Refill: 0 - HYDROcodone-acetaminophen (NORCO) 7.5-325 MG tablet; Take 1 tablet by mouth daily. As needed for BTP (1 tablet per day)  Dispense: 30 tablet; Refill: 0 - HYDROcodone-acetaminophen (NORCO) 7.5-325 MG tablet; Take 1 tablet by mouth every 6 (six) hours as needed for moderate pain.  Dispense: 30 tablet; Refill: 0  14. GAD (generalized anxiety disorder) - CMP14+EGFR - busPIRone (BUSPAR) 15 MG tablet; TAKE 1 TABLET (15 MG TOTAL) BY MOUTH 2 (TWO) TIMES DAILY.  Dispense: 180 tablet; Refill: 2 - citalopram (CELEXA) 20 MG tablet; Take 3 tablets (60 mg total) by mouth at bedtime.  Dispense: 270 tablet; Refill: 2 - diazepam (VALIUM) 5 MG tablet; Take 1 tablet (5 mg total) by mouth every 12 (twelve) hours as needed. for anxiety  Dispense: 60 tablet; Refill: 3  15. Vitamin D deficiency - CMP14+EGFR - VITAMIN D 25 Hydroxy (Vit-D Deficiency, Fractures)  16. Testosterone deficiency - CMP14+EGFR  17. Pain medication agreement signed - CMP14+EGFR  18. Uncomplicated opioid dependence (HCC)  - CMP14+EGFR  19. Hyperlipidemia - CMP14+EGFR - Lipid panel  20. Chronic back pain - CMP14+EGFR - fentaNYL (DURAGESIC) 75 MCG/HR; Place 1 patch (75 mcg total) onto the skin every 3 (three) days.  Dispense: 10  patch; Refill: 0 - fentaNYL (DURAGESIC) 75 MCG/HR; Place 1 patch (75 mcg total) onto the skin every 3 (three) days.  Dispense: 10 patch; Refill: 0 - fentaNYL (DURAGESIC) 75 MCG/HR; Place 1 patch (75 mcg total) onto the skin every 3 (three) days.  Dispense: 10 patch; Refill: 0 - HYDROcodone-acetaminophen (NORCO) 7.5-325 MG tablet; Take 1 tablet by mouth every 6 (six) hours as needed for moderate pain.  Dispense: 30 tablet; Refill: 0 - HYDROcodone-acetaminophen (NORCO) 7.5-325 MG tablet; Take 1 tablet by mouth daily. As needed for BTP (1 tablet per day)  Dispense: 30 tablet; Refill: 0 - HYDROcodone-acetaminophen (NORCO) 7.5-325 MG tablet; Take 1 tablet by mouth every 6 (six) hours as needed for moderate pain.  Dispense: 30 tablet; Refill: 0   Continue all meds Labs pending Health Maintenance reviewed Diet and exercise encouraged RTO 3 months  Evelina Dun, FNP

## 2015-12-04 ENCOUNTER — Other Ambulatory Visit: Payer: Self-pay | Admitting: Family

## 2015-12-04 LAB — CMP14+EGFR
ALT: 34 IU/L (ref 0–44)
AST: 42 IU/L — ABNORMAL HIGH (ref 0–40)
Albumin/Globulin Ratio: 1 — ABNORMAL LOW (ref 1.2–2.2)
Albumin: 3.5 g/dL (ref 3.5–5.5)
Alkaline Phosphatase: 94 IU/L (ref 39–117)
BUN/Creatinine Ratio: 19 (ref 9–20)
BUN: 33 mg/dL — ABNORMAL HIGH (ref 6–24)
Bilirubin Total: 0.5 mg/dL (ref 0.0–1.2)
CO2: 26 mmol/L (ref 18–29)
Calcium: 9.1 mg/dL (ref 8.7–10.2)
Chloride: 93 mmol/L — ABNORMAL LOW (ref 96–106)
Creatinine, Ser: 1.74 mg/dL — ABNORMAL HIGH (ref 0.76–1.27)
GFR calc Af Amer: 52 mL/min/{1.73_m2} — ABNORMAL LOW (ref >59)
GFR calc non Af Amer: 45 mL/min/{1.73_m2} — ABNORMAL LOW (ref >59)
Globulin, Total: 3.5 g/dL (ref 1.5–4.5)
Glucose: 178 mg/dL — ABNORMAL HIGH (ref 65–99)
Potassium: 4.2 mmol/L (ref 3.5–5.2)
Sodium: 137 mmol/L (ref 134–144)
Total Protein: 7 g/dL (ref 6.0–8.5)

## 2015-12-04 LAB — LIPID PANEL
Chol/HDL Ratio: 3.5 ratio units (ref 0.0–5.0)
Cholesterol, Total: 123 mg/dL (ref 100–199)
HDL: 35 mg/dL — ABNORMAL LOW (ref >39)
LDL Calculated: 51 mg/dL (ref 0–99)
Triglycerides: 183 mg/dL — ABNORMAL HIGH (ref 0–149)
VLDL Cholesterol Cal: 37 mg/dL (ref 5–40)

## 2015-12-04 LAB — VITAMIN D 25 HYDROXY (VIT D DEFICIENCY, FRACTURES): Vit D, 25-Hydroxy: 25.1 ng/mL — ABNORMAL LOW (ref 30.0–100.0)

## 2015-12-05 ENCOUNTER — Telehealth: Payer: Self-pay | Admitting: Family

## 2015-12-05 NOTE — Telephone Encounter (Signed)
Aware of results. 

## 2015-12-06 ENCOUNTER — Other Ambulatory Visit: Payer: Self-pay | Admitting: Family

## 2015-12-12 DIAGNOSIS — J449 Chronic obstructive pulmonary disease, unspecified: Secondary | ICD-10-CM | POA: Diagnosis not present

## 2015-12-30 ENCOUNTER — Other Ambulatory Visit: Payer: Self-pay | Admitting: Family

## 2016-01-08 ENCOUNTER — Ambulatory Visit (INDEPENDENT_AMBULATORY_CARE_PROVIDER_SITE_OTHER): Payer: Medicare Other | Admitting: Pharmacist

## 2016-01-08 VITALS — Ht 72.0 in | Wt >= 6400 oz

## 2016-01-08 DIAGNOSIS — J810 Acute pulmonary edema: Secondary | ICD-10-CM | POA: Diagnosis not present

## 2016-01-08 DIAGNOSIS — I82409 Acute embolism and thrombosis of unspecified deep veins of unspecified lower extremity: Secondary | ICD-10-CM | POA: Diagnosis not present

## 2016-01-08 LAB — COAGUCHEK XS/INR WAIVED
INR: 1.6 — ABNORMAL HIGH (ref 0.9–1.1)
Prothrombin Time: 19.5 s

## 2016-01-08 NOTE — Progress Notes (Signed)
Subjective:     Indication: DVT Bleeding signs/symptoms: None Thromboembolic signs/symptoms: None  Missed Coumadin doses: yes missed 1 dose Medication changes: yes - trulicity increased about 2 months ago to 1.5mg  q week and he is tolerating well.  HBG readings 106 to 160 (after ate ice cream) Dietary changes: yes - finding that he gets fuller sooner Bacterial/viral infection: no Other concerns: no  The following portions of the patient's history were reviewed and updated as appropriate: allergies, current medications and problem list.   Objective:    INR Today: 1.6 Last A1c = 8.0% (decreased from 8.1%) Filed Weights   01/08/16 1125  Weight: 445 lb (201.851 kg)       Assessment:    Subtherapeutic INR for goal of 2-3   Uncontrolled type 2 DM - insulin dep  Plan:    1. New dose: take 2 tablets of warfarin 5mg  for the next 2 days, then resume usual dose of 7.5mg  MWF and 5mg  all other days 2. Next INR: 1 month   3.  Continue Trulicity to 1.5mg  sq weekly

## 2016-01-08 NOTE — Patient Instructions (Signed)
Anticoagulation Dose Instructions as of 01/08/2016      [name] Tue Wed Thu Fri Sat   New Dose 5 mg 7.5 mg 5 mg 7.5 mg 5 mg 7.5 mg 5 mg    Description        Take 2 tablets today (01/08/16) and tomorrow (01/09/2016).  Then continue current warfarin dose of 5mg  - take 1 and 1/2 tablets on Monday, wednesdays and fridays.  Take 1 tablet all other days.       INR was 1.6 today

## 2016-01-11 DIAGNOSIS — J449 Chronic obstructive pulmonary disease, unspecified: Secondary | ICD-10-CM | POA: Diagnosis not present

## 2016-01-21 ENCOUNTER — Other Ambulatory Visit: Payer: Self-pay | Admitting: Family

## 2016-01-21 DIAGNOSIS — F329 Major depressive disorder, single episode, unspecified: Secondary | ICD-10-CM

## 2016-01-21 DIAGNOSIS — F32A Depression, unspecified: Secondary | ICD-10-CM

## 2016-01-21 NOTE — Telephone Encounter (Signed)
[facility] last filled 09/20/2015

## 2016-02-06 ENCOUNTER — Encounter: Payer: Self-pay | Admitting: Pharmacist

## 2016-02-06 ENCOUNTER — Ambulatory Visit: Payer: Self-pay | Admitting: Family

## 2016-02-10 ENCOUNTER — Ambulatory Visit: Payer: Self-pay | Admitting: Family

## 2016-02-11 DIAGNOSIS — J449 Chronic obstructive pulmonary disease, unspecified: Secondary | ICD-10-CM | POA: Diagnosis not present

## 2016-02-16 ENCOUNTER — Other Ambulatory Visit: Payer: Self-pay | Admitting: Family

## 2016-02-16 DIAGNOSIS — E1165 Type 2 diabetes mellitus with hyperglycemia: Secondary | ICD-10-CM

## 2016-02-16 DIAGNOSIS — Z794 Long term (current) use of insulin: Principal | ICD-10-CM

## 2016-03-05 ENCOUNTER — Other Ambulatory Visit: Payer: Self-pay | Admitting: Family

## 2016-03-06 ENCOUNTER — Ambulatory Visit (INDEPENDENT_AMBULATORY_CARE_PROVIDER_SITE_OTHER): Payer: Medicare Other | Admitting: Family

## 2016-03-06 ENCOUNTER — Encounter: Payer: Self-pay | Admitting: Family

## 2016-03-06 VITALS — BP 117/71 | HR 63 | Temp 98.4°F

## 2016-03-06 DIAGNOSIS — G4733 Obstructive sleep apnea (adult) (pediatric): Secondary | ICD-10-CM

## 2016-03-06 DIAGNOSIS — G40909 Epilepsy, unspecified, not intractable, without status epilepticus: Secondary | ICD-10-CM | POA: Diagnosis not present

## 2016-03-06 DIAGNOSIS — E785 Hyperlipidemia, unspecified: Secondary | ICD-10-CM | POA: Diagnosis not present

## 2016-03-06 DIAGNOSIS — G894 Chronic pain syndrome: Secondary | ICD-10-CM | POA: Diagnosis not present

## 2016-03-06 DIAGNOSIS — K2100 Gastro-esophageal reflux disease with esophagitis, without bleeding: Secondary | ICD-10-CM

## 2016-03-06 DIAGNOSIS — Z794 Long term (current) use of insulin: Secondary | ICD-10-CM | POA: Diagnosis not present

## 2016-03-06 DIAGNOSIS — F1120 Opioid dependence, uncomplicated: Secondary | ICD-10-CM

## 2016-03-06 DIAGNOSIS — G8929 Other chronic pain: Secondary | ICD-10-CM

## 2016-03-06 DIAGNOSIS — M549 Dorsalgia, unspecified: Secondary | ICD-10-CM | POA: Diagnosis not present

## 2016-03-06 DIAGNOSIS — M05719 Rheumatoid arthritis with rheumatoid factor of unspecified shoulder without organ or systems involvement: Secondary | ICD-10-CM

## 2016-03-06 DIAGNOSIS — E1165 Type 2 diabetes mellitus with hyperglycemia: Secondary | ICD-10-CM

## 2016-03-06 DIAGNOSIS — G43719 Chronic migraine without aura, intractable, without status migrainosus: Secondary | ICD-10-CM

## 2016-03-06 DIAGNOSIS — Z79899 Other long term (current) drug therapy: Secondary | ICD-10-CM

## 2016-03-06 DIAGNOSIS — J810 Acute pulmonary edema: Secondary | ICD-10-CM | POA: Diagnosis not present

## 2016-03-06 DIAGNOSIS — I82409 Acute embolism and thrombosis of unspecified deep veins of unspecified lower extremity: Secondary | ICD-10-CM | POA: Diagnosis not present

## 2016-03-06 DIAGNOSIS — E559 Vitamin D deficiency, unspecified: Secondary | ICD-10-CM

## 2016-03-06 DIAGNOSIS — F411 Generalized anxiety disorder: Secondary | ICD-10-CM

## 2016-03-06 DIAGNOSIS — N400 Enlarged prostate without lower urinary tract symptoms: Secondary | ICD-10-CM | POA: Diagnosis not present

## 2016-03-06 DIAGNOSIS — Z0289 Encounter for other administrative examinations: Secondary | ICD-10-CM

## 2016-03-06 LAB — BAYER DCA HB A1C WAIVED: HB A1C (BAYER DCA - WAIVED): 7.5 % — ABNORMAL HIGH (ref <7.0)

## 2016-03-06 LAB — COAGUCHEK XS/INR WAIVED
INR: 2.8 — ABNORMAL HIGH (ref 0.9–1.1)
Prothrombin Time: 33.8 s

## 2016-03-06 MED ORDER — HYDROCODONE-ACETAMINOPHEN 7.5-325 MG PO TABS: 1.0000 | ORAL_TABLET | Freq: Four times a day (QID) | ORAL | 0 refills | Status: DC | PRN

## 2016-03-06 MED ORDER — FENTANYL 75 MCG/HR TD PT72: 75.0000 ug | MEDICATED_PATCH | TRANSDERMAL | 0 refills | Status: DC

## 2016-03-06 MED ORDER — HYDROCODONE-ACETAMINOPHEN 7.5-325 MG PO TABS: 1.0000 | ORAL_TABLET | Freq: Every day | ORAL | 0 refills | Status: DC

## 2016-03-06 MED ORDER — ZOLPIDEM TARTRATE 5 MG PO TABS: 5.0000 mg | ORAL_TABLET | Freq: Every day | ORAL | 3 refills | Status: DC

## 2016-03-06 MED ORDER — PREDNISONE 10 MG (21) PO TBPK: ORAL_TABLET | ORAL | 0 refills | Status: DC

## 2016-03-06 MED ORDER — DIAZEPAM 5 MG PO TABS: 5.0000 mg | ORAL_TABLET | Freq: Two times a day (BID) | ORAL | 3 refills | Status: DC | PRN

## 2016-03-06 MED ORDER — KETOROLAC TROMETHAMINE 60 MG/2ML IM SOLN
60.0000 mg | Freq: Once | INTRAMUSCULAR | Status: AC
  Administered 2016-03-06: 60 mg via INTRAMUSCULAR

## 2016-03-06 NOTE — Patient Instructions (Signed)

## 2016-03-06 NOTE — Progress Notes (Signed)
Subjective:    Patient ID: [name], male    DOB: [birth year], 50 y.o.   MRN: [id number]  Pt presents to the office today for chronic follow up. Pt is followed by neurologists every 6 months for migraines and seizures. Pt is followed by nephrologists annually.  Medication Refill  Pertinent negatives include no coughing, myalgias, sore throat or visual change.  Diabetes  He presents for his follow-up diabetic visit. He has type 2 diabetes mellitus. His disease course has been improving. Hypoglycemia symptoms include seizures. Pertinent negatives for hypoglycemia include no confusion, dizziness or nervousness/anxiousness. Associated symptoms include foot paresthesias. Pertinent negatives for diabetes include no blurred vision, no foot ulcerations and no visual change. There are no hypoglycemic complications. Symptoms are worsening. Diabetic complications include peripheral neuropathy. Pertinent negatives for diabetic complications include no CVA or heart disease. Risk factors for coronary artery disease include diabetes mellitus, dyslipidemia, hypertension, male sex, obesity and sedentary lifestyle. Current diabetic treatment includes insulin injections and oral agent (dual therapy). He is compliant with treatment all of the time. He is following a generally unhealthy diet. His breakfast blood glucose range is generally 130-140 mg/dl. Eye exam is current (07/2015).  Hyperlipidemia  This is a chronic problem. The current episode started more than 1 year ago. The problem is controlled. Recent lipid tests were reviewed and are normal. Exacerbating diseases include diabetes and obesity. He has no history of hypothyroidism. Factors aggravating his hyperlipidemia include fatty foods. Pertinent negatives include no leg pain or myalgias. Current antihyperlipidemic treatment includes statins. The current treatment provides moderate improvement of lipids. Risk factors for coronary artery disease include  diabetes mellitus, dyslipidemia, family history, hypertension, obesity, male sex and a sedentary lifestyle.  Gastroesophageal Reflux  He complains of heartburn. He reports no coughing, no sore throat or no wheezing. This is a chronic problem. The current episode started more than 1 year ago. The problem occurs rarely. The problem has been resolved. The symptoms are aggravated by certain foods. Risk factors include obesity. He has tried a PPI for the symptoms. The treatment provided significant relief.  Anxiety  Presents for follow-up visit. Onset was more than 5 years ago. Symptoms include depressed mood, excessive worry and insomnia. Patient reports no confusion, dizziness, nervous/anxious behavior or panic. Symptoms occur occasionally. The quality of sleep is good.   His past medical history is significant for depression. There is no history of anxiety/panic attacks. Past treatments include SSRIs. The treatment provided moderate relief.  Benign Prostatic Hypertrophy  This is a chronic problem. The current episode started more than 1 year ago. The problem has been resolved since onset. Irritative symptoms include nocturia (2 times). Obstructive symptoms do not include an intermittent stream or a weak stream. Pertinent negatives include no hematuria. Past treatments include tamsulosin. The treatment provided significant relief.  Seizures   This is a chronic problem. The current episode started more than 1 week ago. Progression since onset: Pt states he usually has seziures at night, and has them every few weeks. Pt states his last one was last week-Pt is followed by neurologists every few months. Pertinent negatives include no confusion, no sore throat and no cough. Characteristics do not include bowel incontinence or bladder incontinence.  Migraine   This is a chronic (pt is followed by nuerologists every few month- Pt was getting lidocaine injections) problem. The current episode started more than 1  year ago. The problem occurs intermittently. The problem has been waxing and waning. The pain does not  radiate. The pain quality is similar to prior headaches. The quality of the pain is described as aching. The pain is at a severity of 9/10. The pain is moderate. Associated symptoms include back pain, insomnia, photophobia and seizures. Pertinent negatives include no blurred vision, coughing, dizziness, sore throat or visual change. His past medical history is significant for obesity.  Back Pain  This is a chronic problem. The current episode started more than 1 year ago. The problem occurs constantly. The problem has been waxing and waning since onset. The pain is present in the lumbar spine. The quality of the pain is described as aching. The pain is at a severity of 7/10. The pain is moderate. Pertinent negatives include no bladder incontinence, bowel incontinence or leg pain. Risk factors include obesity. He has tried analgesics and NSAIDs for the symptoms. The treatment provided mild relief.  OAB Pt states he does not use CPAP at night. States he uses 2L oxygen at night.   Hx DVT & PE, Chronic anticoagulation PT currently taking warfarin. See anticoagulation flow sheet.     Review of Systems  Constitutional: Negative.   HENT: Negative.  Negative for sore throat.   Eyes: Positive for photophobia. Negative for blurred vision.  Respiratory: Negative.  Negative for cough and wheezing.   Cardiovascular: Negative.   Gastrointestinal: Positive for heartburn. Negative for bowel incontinence.  Endocrine: Negative.   Genitourinary: Positive for nocturia (2 times). Negative for bladder incontinence and hematuria.  Musculoskeletal: Positive for back pain. Negative for myalgias.  Neurological: Positive for seizures. Negative for dizziness.  Hematological: Negative.   Psychiatric/Behavioral: Negative for confusion. The patient has insomnia. The patient is not nervous/anxious.   All other systems  reviewed and are negative.      Objective:   Physical Exam  Constitutional: He is oriented to person, place, and time. He appears well-developed and well-nourished. No distress.  Morbid obese  HENT:  Head: Normocephalic.  Right Ear: External ear normal.  Left Ear: External ear normal.  Nose: Nose normal.  Mouth/Throat: Oropharynx is clear and moist.  Eyes: Pupils are equal, round, and reactive to light. Right eye exhibits no discharge. Left eye exhibits no discharge.  Neck: Normal range of motion. Neck supple. No thyromegaly present.  Cardiovascular: Normal rate, regular rhythm, normal heart sounds and intact distal pulses.   No murmur heard. Pulmonary/Chest: Effort normal and breath sounds normal. No respiratory distress. He has no wheezes.  Abdominal: Soft. Bowel sounds are normal. He exhibits no distension. There is no tenderness.  Musculoskeletal: Normal range of motion. He exhibits edema (trace amt). He exhibits no tenderness.  Pt in electric wheelchair   Neurological: He is alert and oriented to person, place, and time. No cranial nerve deficit.  Skin: Skin is warm and dry. No rash noted. No erythema.  Psychiatric: He has a normal mood and affect. His behavior is normal. Judgment and thought content normal.  Vitals reviewed.     BP 117/71   Pulse 63   Temp 98.4 F (36.9 C) (Oral)      Assessment & Plan:  1. Postoperative pulmonary edema (HCC) - CoaguChek XS/INR Waived - CoaguChek XS/INR Waived - CMP14+EGFR  2. DVT (deep venous thrombosis), unspecified laterality - CoaguChek XS/INR Waived - CoaguChek XS/INR Waived - CMP14+EGFR  3. 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 OSA (obstructive sleep apnea) - CMP14+EGFR  5. Gastroesophageal reflux disease with esophagitis - CMP14+EGFR  6. Type 2 diabetes mellitus with  hyperglycemia, with long-term current use of insulin (HCC) - Bayer DCA Hb A1c Waived -  CMP14+EGFR  7. Seizure disorder (HCC) - CMP14+EGFR  8. BPH (benign prostatic hyperplasia) - CMP14+EGFR  9. Chronic pain syndrome - HYDROcodone-acetaminophen (NORCO) 7.5-325 MG tablet; Take 1 tablet by mouth every 6 (six) hours as needed for moderate pain.  Dispense: 30 tablet; Refill: 0 - HYDROcodone-acetaminophen (NORCO) 7.5-325 MG tablet; Take 1 tablet by mouth daily. As needed for BTP (1 tablet per day)  Dispense: 30 tablet; Refill: 0 - HYDROcodone-acetaminophen (NORCO) 7.5-325 MG tablet; Take 1 tablet by mouth every 6 (six) hours as needed for moderate pain.  Dispense: 30 tablet; Refill: 0 - fentaNYL (DURAGESIC) 75 MCG/HR; Place 1 patch (75 mcg total) onto the skin every 3 (three) days.  Dispense: 10 patch; Refill: 0 - fentaNYL (DURAGESIC) 75 MCG/HR; Place 1 patch (75 mcg total) onto the skin every 3 (three) days.  Dispense: 10 patch; Refill: 0 - fentaNYL (DURAGESIC) 75 MCG/HR; Place 1 patch (75 mcg total) onto the skin every 3 (three) days.  Dispense: 10 patch; Refill: 0 - CMP14+EGFR  10. GAD (generalized anxiety disorder) - diazepam (VALIUM) 5 MG tablet; Take 1 tablet (5 mg total) by mouth every 12 (twelve) hours as needed. for anxiety  Dispense: 60 tablet; Refill: 3 - CMP14+EGFR  11. Hyperlipidemia - CMP14+EGFR  12. Morbid obesity, unspecified obesity type ([place]) - CMP14+EGFR  13. Uncomplicated opioid dependence (HCC) - CMP14+EGFR  14. Pain medication agreement signed - CMP14+EGFR  15. Vitamin D deficiency - CMP14+EGFR  16. 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  Dispense: 30 tablet; Refill: 0 - HYDROcodone-acetaminophen (NORCO) 7.5-325 MG tablet; Take 1 tablet by mouth every 6 (six) hours as needed for moderate pain.  Dispense: 30 tablet; Refill: 0 - fentaNYL (DURAGESIC) 75 MCG/HR;  Place 1 patch (75 mcg total) onto the skin every 3 (three) days.  Dispense: 10 patch; Refill: 0 - fentaNYL (DURAGESIC) 75 MCG/HR; Place 1 patch (75 mcg total) onto the skin every 3 (three) days.  Dispense: 10 patch; Refill: 0 - fentaNYL (DURAGESIC) 75 MCG/HR; Place 1 patch (75 mcg total) onto the skin every 3 (three) days.  Dispense: 10 patch; Refill: 0 - CMP14+EGFR  17. Chronic back pain - HYDROcodone-acetaminophen (NORCO) 7.5-325 MG tablet; Take 1 tablet by mouth every 6 (six) hours as needed for moderate pain.  Dispense: 30 tablet; Refill: 0 - HYDROcodone-acetaminophen (NORCO) 7.5-325 MG tablet; Take 1 tablet by mouth daily. As needed for BTP (1 tablet per day)  Dispense: 30 tablet; Refill: 0 - HYDROcodone-acetaminophen (NORCO) 7.5-325 MG tablet; Take 1 tablet by mouth every 6 (six) hours as needed for moderate pain.  Dispense: 30 tablet; Refill: 0 - fentaNYL (DURAGESIC) 75 MCG/HR; Place 1 patch (75 mcg total) onto the skin every 3 (three) days.  Dispense: 10 patch; Refill: 0 - fentaNYL (DURAGESIC) 75 MCG/HR; Place 1 patch (75 mcg total) onto the skin every 3 (three) days.  Dispense: 10 patch; Refill: 0 - fentaNYL (DURAGESIC) 75 MCG/HR; Place 1 patch (75 mcg total) onto the skin every 3 (three) days.  Dispense: 10 patch; Refill: 0 - CMP14+EGFR - ketorolac (TORADOL) injection 60 mg; Inject 2 mLs (60 mg total) into the muscle once. - predniSONE (STERAPRED UNI-PAK 21 TAB) 10 MG (21) TBPK tablet; Use as directed  Dispense: 21 tablet; Refill: 0   [place] Controlled  Database reviewed: Pt has only received medication from me.  Continue all meds Labs pending Health Maintenance reviewed Diet and exercise encouraged RTO 3 months  Evelina Dun, FNP

## 2016-03-07 LAB — CMP14+EGFR
ALT: 19 IU/L (ref 0–44)
AST: 31 IU/L (ref 0–40)
Albumin/Globulin Ratio: 1 — ABNORMAL LOW (ref 1.2–2.2)
Albumin: 3.6 g/dL (ref 3.5–5.5)
Alkaline Phosphatase: 88 IU/L (ref 39–117)
BUN/Creatinine Ratio: 18 (ref 9–20)
BUN: 27 mg/dL — ABNORMAL HIGH (ref 6–24)
Bilirubin Total: 0.5 mg/dL (ref 0.0–1.2)
CO2: 28 mmol/L (ref 18–29)
Calcium: 8.5 mg/dL — ABNORMAL LOW (ref 8.7–10.2)
Chloride: 94 mmol/L — ABNORMAL LOW (ref 96–106)
Creatinine, Ser: 1.52 mg/dL — ABNORMAL HIGH (ref 0.76–1.27)
GFR calc Af Amer: 61 mL/min/{1.73_m2} (ref >59)
GFR calc non Af Amer: 53 mL/min/{1.73_m2} — ABNORMAL LOW (ref >59)
Globulin, Total: 3.6 g/dL (ref 1.5–4.5)
Glucose: 258 mg/dL — ABNORMAL HIGH (ref 65–99)
Potassium: 4 mmol/L (ref 3.5–5.2)
Sodium: 139 mmol/L (ref 134–144)
Total Protein: 7.2 g/dL (ref 6.0–8.5)

## 2016-03-13 DIAGNOSIS — J449 Chronic obstructive pulmonary disease, unspecified: Secondary | ICD-10-CM | POA: Diagnosis not present

## 2016-03-15 ENCOUNTER — Other Ambulatory Visit: Payer: Self-pay | Admitting: Family

## 2016-03-27 ENCOUNTER — Other Ambulatory Visit: Payer: Self-pay

## 2016-03-27 MED ORDER — CANAGLIFLOZIN 100 MG PO TABS: 100.0000 mg | ORAL_TABLET | Freq: Every day | ORAL | 0 refills | Status: DC

## 2016-03-30 ENCOUNTER — Other Ambulatory Visit: Payer: Self-pay | Admitting: Family

## 2016-04-12 DIAGNOSIS — J449 Chronic obstructive pulmonary disease, unspecified: Secondary | ICD-10-CM | POA: Diagnosis not present

## 2016-04-19 ENCOUNTER — Other Ambulatory Visit: Payer: Self-pay | Admitting: Family

## 2016-04-24 ENCOUNTER — Ambulatory Visit (INDEPENDENT_AMBULATORY_CARE_PROVIDER_SITE_OTHER): Payer: Medicare Other | Admitting: Pharmacist

## 2016-04-24 DIAGNOSIS — Z23 Encounter for immunization: Secondary | ICD-10-CM | POA: Diagnosis not present

## 2016-04-24 DIAGNOSIS — J810 Acute pulmonary edema: Secondary | ICD-10-CM

## 2016-04-24 DIAGNOSIS — I82409 Acute embolism and thrombosis of unspecified deep veins of unspecified lower extremity: Secondary | ICD-10-CM

## 2016-04-24 LAB — COAGUCHEK XS/INR WAIVED
INR: 2.3 — ABNORMAL HIGH (ref 0.9–1.1)
Prothrombin Time: 28 s

## 2016-04-24 NOTE — Patient Instructions (Signed)
Anticoagulation Dose Instructions as of 04/24/2016      Dorene Grebe Tue Wed Thu Fri Sat   New Dose 5 mg 5 mg 7.5 mg 5 mg 5 mg 5 mg 7.5 mg    Description   Continue to take warfarin the same way you have been. Take 1.5 tablet on Tuesday and Saturday, and 1 tablet the rest of the week.  INR today was 2.3

## 2016-05-06 ENCOUNTER — Telehealth: Payer: Self-pay | Admitting: Family

## 2016-05-06 MED ORDER — INSULIN GLARGINE 300 UNIT/ML ~~LOC~~ SOPN: 190.0000 [IU] | PEN_INJECTOR | Freq: Every day | SUBCUTANEOUS | 1 refills | Status: DC

## 2016-05-06 NOTE — Telephone Encounter (Signed)
Patients states that he is out of Toujeo and he was told by pharmacy that he cannot get unitl 05/16/2016.  Reviewed last Rx sent in.  Was for only 3 pens or 4.65ml which is only 7 days.  Called [facility] and they confirmed only 3 pens dispensed and that they processed as just 7 day supply. They are unsure why patient was told he could not get Toujeo.  I sent in new Rx for 84 day supply (patient requested 90 but this is closest we could get) - will be for 73mL or 12 boxes) Patient aware - he is to call pharmacy in 1-2 hours to make sure ready prior to picking up. Patient also voiced concern about his next pain management appointment - last was 03/06/16 and next is 06/09/2016.  He is concerned about running out of medication.  I explained to patient that Rx for pain medication can only be filled at an appt.  I offered to move his appt to 06/05/16 but he needed to check with his wife.  He will call back an change once he talks with her.

## 2016-05-07 ENCOUNTER — Telehealth: Payer: Self-pay | Admitting: Family

## 2016-05-07 MED ORDER — GLUCOSE BLOOD VI STRP: ORAL_STRIP | 3 refills | Status: DC

## 2016-05-07 NOTE — Telephone Encounter (Signed)
Patient has new meter and need test strips sent in - Accu check Aviva plus.  Rx sent

## 2016-05-13 DIAGNOSIS — J449 Chronic obstructive pulmonary disease, unspecified: Secondary | ICD-10-CM | POA: Diagnosis not present

## 2016-05-16 ENCOUNTER — Other Ambulatory Visit: Payer: Self-pay | Admitting: Family

## 2016-05-22 ENCOUNTER — Other Ambulatory Visit: Payer: Self-pay | Admitting: Family

## 2016-05-22 DIAGNOSIS — Z794 Long term (current) use of insulin: Principal | ICD-10-CM

## 2016-05-22 DIAGNOSIS — E1165 Type 2 diabetes mellitus with hyperglycemia: Secondary | ICD-10-CM

## 2016-06-04 ENCOUNTER — Encounter: Payer: Self-pay | Admitting: Family

## 2016-06-04 ENCOUNTER — Ambulatory Visit (INDEPENDENT_AMBULATORY_CARE_PROVIDER_SITE_OTHER): Payer: Medicare Other | Admitting: Family

## 2016-06-04 ENCOUNTER — Telehealth: Payer: Self-pay | Admitting: *Deleted

## 2016-06-04 VITALS — BP 127/76 | HR 66 | Temp 98.4°F | Wt >= 6400 oz

## 2016-06-04 DIAGNOSIS — I824Z9 Acute embolism and thrombosis of unspecified deep veins of unspecified distal lower extremity: Secondary | ICD-10-CM | POA: Diagnosis not present

## 2016-06-04 DIAGNOSIS — K2100 Gastro-esophageal reflux disease with esophagitis, without bleeding: Secondary | ICD-10-CM

## 2016-06-04 DIAGNOSIS — G40909 Epilepsy, unspecified, not intractable, without status epilepticus: Secondary | ICD-10-CM

## 2016-06-04 DIAGNOSIS — E349 Endocrine disorder, unspecified: Secondary | ICD-10-CM

## 2016-06-04 DIAGNOSIS — J810 Acute pulmonary edema: Secondary | ICD-10-CM | POA: Diagnosis not present

## 2016-06-04 DIAGNOSIS — Z86711 Personal history of pulmonary embolism: Secondary | ICD-10-CM

## 2016-06-04 DIAGNOSIS — Z794 Long term (current) use of insulin: Secondary | ICD-10-CM

## 2016-06-04 DIAGNOSIS — G4733 Obstructive sleep apnea (adult) (pediatric): Secondary | ICD-10-CM | POA: Diagnosis not present

## 2016-06-04 DIAGNOSIS — Z0289 Encounter for other administrative examinations: Secondary | ICD-10-CM

## 2016-06-04 DIAGNOSIS — Z7901 Long term (current) use of anticoagulants: Secondary | ICD-10-CM

## 2016-06-04 DIAGNOSIS — N401 Enlarged prostate with lower urinary tract symptoms: Secondary | ICD-10-CM | POA: Diagnosis not present

## 2016-06-04 DIAGNOSIS — I82409 Acute embolism and thrombosis of unspecified deep veins of unspecified lower extremity: Secondary | ICD-10-CM | POA: Diagnosis not present

## 2016-06-04 DIAGNOSIS — E1165 Type 2 diabetes mellitus with hyperglycemia: Secondary | ICD-10-CM | POA: Diagnosis not present

## 2016-06-04 DIAGNOSIS — G894 Chronic pain syndrome: Secondary | ICD-10-CM

## 2016-06-04 DIAGNOSIS — G43719 Chronic migraine without aura, intractable, without status migrainosus: Secondary | ICD-10-CM

## 2016-06-04 DIAGNOSIS — M5441 Lumbago with sciatica, right side: Secondary | ICD-10-CM

## 2016-06-04 DIAGNOSIS — G8929 Other chronic pain: Secondary | ICD-10-CM

## 2016-06-04 DIAGNOSIS — F411 Generalized anxiety disorder: Secondary | ICD-10-CM

## 2016-06-04 DIAGNOSIS — M05719 Rheumatoid arthritis with rheumatoid factor of unspecified shoulder without organ or systems involvement: Secondary | ICD-10-CM

## 2016-06-04 DIAGNOSIS — F1120 Opioid dependence, uncomplicated: Secondary | ICD-10-CM

## 2016-06-04 DIAGNOSIS — E662 Morbid (severe) obesity with alveolar hypoventilation: Secondary | ICD-10-CM

## 2016-06-04 DIAGNOSIS — Z86718 Personal history of other venous thrombosis and embolism: Secondary | ICD-10-CM

## 2016-06-04 DIAGNOSIS — E559 Vitamin D deficiency, unspecified: Secondary | ICD-10-CM

## 2016-06-04 DIAGNOSIS — E785 Hyperlipidemia, unspecified: Secondary | ICD-10-CM

## 2016-06-04 LAB — COAGUCHEK XS/INR WAIVED
INR: 2.9 — ABNORMAL HIGH (ref 0.9–1.1)
Prothrombin Time: 34.5 s

## 2016-06-04 LAB — BAYER DCA HB A1C WAIVED: HB A1C (BAYER DCA - WAIVED): 7.6 % — ABNORMAL HIGH (ref <7.0)

## 2016-06-04 MED ORDER — FENTANYL 75 MCG/HR TD PT72: 75.0000 ug | MEDICATED_PATCH | TRANSDERMAL | 0 refills | Status: DC

## 2016-06-04 MED ORDER — DOXYCYCLINE HYCLATE 100 MG PO TABS: 100.0000 mg | ORAL_TABLET | Freq: Two times a day (BID) | ORAL | 0 refills | Status: DC

## 2016-06-04 MED ORDER — DIAZEPAM 5 MG PO TABS: 5.0000 mg | ORAL_TABLET | Freq: Two times a day (BID) | ORAL | 3 refills | Status: DC | PRN

## 2016-06-04 MED ORDER — HYDROCODONE-ACETAMINOPHEN 7.5-325 MG PO TABS: 1.0000 | ORAL_TABLET | Freq: Four times a day (QID) | ORAL | 0 refills | Status: DC | PRN

## 2016-06-04 MED ORDER — HYDROCODONE-ACETAMINOPHEN 7.5-325 MG PO TABS: 1.0000 | ORAL_TABLET | Freq: Every day | ORAL | 0 refills | Status: DC

## 2016-06-04 NOTE — Telephone Encounter (Signed)
Call from pharmacy regarding interaction between Doxycycline and Warfarin Per Evelina Dun, ok to take but needs INR on Tuesday

## 2016-06-04 NOTE — Patient Instructions (Signed)

## 2016-06-04 NOTE — Progress Notes (Signed)
Subjective:    Patient ID: [name], male    DOB: 1965/09/04, 50 y.o.   MRN: [id number]  Pt presents to the office today for chronic follow up. Pt is followed by neurologists every 6 months for migraines and seizures.  Diabetes  He presents for his follow-up diabetic visit. He has type 2 diabetes mellitus. His disease course has been improving. Hypoglycemia symptoms include seizures. Pertinent negatives for hypoglycemia include no confusion, dizziness or nervousness/anxiousness. Associated symptoms include foot paresthesias. Pertinent negatives for diabetes include no blurred vision and no foot ulcerations. There are no hypoglycemic complications. Symptoms are worsening. Diabetic complications include peripheral neuropathy. Pertinent negatives for diabetic complications include no CVA or heart disease. Risk factors for coronary artery disease include diabetes mellitus, dyslipidemia, hypertension, male sex, obesity and sedentary lifestyle. Current diabetic treatment includes insulin injections and oral agent (dual therapy). He is compliant with treatment all of the time. He is following a generally unhealthy diet. His breakfast blood glucose range is generally 130-140 mg/dl. Eye exam is current (07/2015).  Hyperlipidemia  This is a chronic problem. The current episode started more than 1 year ago. The problem is controlled. Recent lipid tests were reviewed and are normal. Exacerbating diseases include diabetes and obesity. He has no history of hypothyroidism. Factors aggravating his hyperlipidemia include fatty foods. Pertinent negatives include no leg pain. Current antihyperlipidemic treatment includes statins. The current treatment provides moderate improvement of lipids. Risk factors for coronary artery disease include diabetes mellitus, dyslipidemia, family history, hypertension, obesity, male sex and a sedentary lifestyle.  Gastroesophageal Reflux  He complains of coughing, heartburn, a  hoarse voice and a sore throat. He reports no wheezing. This is a chronic problem. The current episode started more than 1 year ago. The problem occurs rarely. The problem has been resolved. The symptoms are aggravated by certain foods. Risk factors include obesity. He has tried a PPI for the symptoms. The treatment provided significant relief.  Anxiety  Presents for follow-up visit. Onset was more than 5 years ago. Symptoms include depressed mood, excessive worry and insomnia. Patient reports no confusion, dizziness, nervous/anxious behavior or panic. Symptoms occur occasionally. The quality of sleep is good.   His past medical history is significant for depression. There is no history of anxiety/panic attacks. Past treatments include SSRIs. The treatment provided moderate relief.  Benign Prostatic Hypertrophy  This is a chronic problem. The current episode started more than 1 year ago. The problem has been resolved since onset. Irritative symptoms include nocturia (4-6 times ). Obstructive symptoms include straining. Obstructive symptoms do not include an intermittent stream or a weak stream. Pertinent negatives include no hematuria. Past treatments include tamsulosin. The treatment provided significant relief.  Seizures   This is a chronic problem. The current episode started more than 1 week ago. Progression since onset: Pt states he usually has seziures at night, and has them every few weeks. Pt states his last one was last week-Pt is followed by neurologists every few months. Associated symptoms include sore throat and cough. Pertinent negatives include no confusion. Characteristics do not include bowel incontinence or bladder incontinence.  Migraine   This is a chronic (pt is followed by nuerologists every few month- Pt was getting lidocaine injections) problem. The current episode started more than 1 year ago. The problem occurs intermittently. The problem has been gradually improving. The pain  does not radiate. The pain quality is similar to prior headaches. The quality of the pain is described as aching. The  pain is at a severity of 9/10. The pain is moderate. Associated symptoms include back pain, coughing, insomnia, photophobia, seizures, sinus pressure and a sore throat. Pertinent negatives include no blurred vision or dizziness. His past medical history is significant for obesity.  Back Pain  This is a chronic problem. The current episode started more than 1 year ago. The problem occurs constantly. The problem has been waxing and waning since onset. The pain is present in the lumbar spine. The quality of the pain is described as aching. The pain is at a severity of 10/10. The pain is moderate. Pertinent negatives include no bladder incontinence, bowel incontinence or leg pain. Risk factors include obesity. He has tried analgesics and NSAIDs for the symptoms. The treatment provided mild relief.  Sinusitis  This is a new problem. The current episode started 1 to 4 weeks ago. The problem has been waxing and waning since onset. The pain is mild. Associated symptoms include congestion, coughing, a hoarse voice, sinus pressure, sneezing and a sore throat. Past treatments include lying down and oral decongestants. The treatment provided mild relief.  OSA Pt states he does not use CPAP at night. States he uses 2L oxygen at night.   Hx DVT & PE, Chronic anticoagulation PT currently taking warfarin. See anticoagulation flow sheet.   Pain assessment: Cause of pain-  Pain location- Chronic back pain Pain on scale of 1-10- 102 Frequency- Constantly What increases pain-Standing What makes pain Better-Pain medication  Pill count performed-No Urine drug screen- No,  08/02/15 Was the [facility] reviewed- Yes  If yes were their any concerning findings? -Only received controlled medications from me.    Review of Systems  Constitutional: Negative.   HENT: Positive for congestion, hoarse voice, sinus  pressure, sneezing and sore throat.   Eyes: Positive for photophobia. Negative for blurred vision.  Respiratory: Positive for cough. Negative for wheezing.   Cardiovascular: Negative.   Gastrointestinal: Positive for heartburn. Negative for bowel incontinence.  Endocrine: Negative.   Genitourinary: Positive for nocturia (4-6 times ). Negative for bladder incontinence and hematuria.  Musculoskeletal: Positive for back pain.  Neurological: Positive for seizures. Negative for dizziness.  Hematological: Negative.   Psychiatric/Behavioral: Negative for confusion. The patient has insomnia. The patient is not nervous/anxious.   All other systems reviewed and are negative.      Objective:   Physical Exam  Constitutional: He is oriented to person, place, and time. He appears well-developed and well-nourished. No distress.  Morbid obese  HENT:  Head: Normocephalic.  Right Ear: External ear normal.  Left Ear: External ear normal.  Nose: Nose normal.  Mouth/Throat: Oropharynx is clear and moist.  Eyes: Pupils are equal, round, and reactive to light. Right eye exhibits no discharge. Left eye exhibits no discharge.  Neck: Normal range of motion. Neck supple. No thyromegaly present.  Cardiovascular: Normal rate, regular rhythm, normal heart sounds and intact distal pulses.   No murmur heard. Pulmonary/Chest: Effort normal and breath sounds normal. No respiratory distress. He has no wheezes.  Abdominal: Soft. Bowel sounds are normal. He exhibits no distension. There is no tenderness.  Musculoskeletal: Normal range of motion. He exhibits edema (trace amt). He exhibits no tenderness.  Pt in electric wheelchair   Neurological: He is alert and oriented to person, place, and time. No cranial nerve deficit.  Skin: Skin is warm and dry. No rash noted. No erythema.  Psychiatric: He has a normal mood and affect. His behavior is normal. Judgment and thought content normal.  Vitals reviewed.     BP  127/76   Pulse 66   Temp 98.4 F (36.9 C) (Oral)   Wt (!) 445 lb (201.9 kg) Comment: Unable to weigh. Weight amount given per patient.  BMI 60.35 kg/m      Assessment & Plan:  1. Postoperative pulmonary edema (HCC) - CoaguChek XS/INR Waived - CMP14+EGFR  2. Deep vein thrombosis (DVT) (HCC) - CoaguChek XS/INR Waived - CMP14+EGFR  3. Acute deep vein thrombosis (DVT) of distal vein of lower extremity, unspecified laterality (HCC)  - CMP14+EGFR  4. Intractable chronic migraine without aura and without status migrainosus - CMP14+EGFR  5. OSA (obstructive sleep apnea) - CMP14+EGFR  6. Gastroesophageal reflux disease with esophagitis - CMP14+EGFR  7. Type 2 diabetes mellitus with hyperglycemia, with long-term current use of insulin (HCC) - Bayer DCA Hb A1c Waived - CMP14+EGFR  8. Seizure disorder (HCC) - CMP14+EGFR  9. Benign prostatic hyperplasia with lower urinary tract symptoms, symptom details unspecified - CMP14+EGFR  10. Chronic anticoagulation - CMP14+EGFR  11. Chronic pain syndrome - CMP14+EGFR - fentaNYL (DURAGESIC) 75 MCG/HR; Place 1 patch (75 mcg total) onto the skin every 3 (three) days.  Dispense: 10 patch; Refill: 0 - fentaNYL (DURAGESIC) 75 MCG/HR; Place 1 patch (75 mcg total) onto the skin every 3 (three) days.  Dispense: 10 patch; Refill: 0 - fentaNYL (DURAGESIC) 75 MCG/HR; Place 1 patch (75 mcg total) onto the skin every 3 (three) days.  Dispense: 10 patch; Refill: 0 - HYDROcodone-acetaminophen (NORCO) 7.5-325 MG tablet; Take 1 tablet by mouth every 6 (six) hours as needed for moderate pain.  Dispense: 30 tablet; Refill: 0 - HYDROcodone-acetaminophen (NORCO) 7.5-325 MG tablet; Take 1 tablet by mouth daily. As needed for BTP (1 tablet per day)  Dispense: 30 tablet; Refill: 0 - HYDROcodone-acetaminophen (NORCO) 7.5-325 MG tablet; Take 1 tablet by mouth every 6 (six) hours as needed for moderate pain.  Dispense: 30 tablet; Refill: 0  12. GAD (generalized  anxiety disorder)  - CMP14+EGFR - diazepam (VALIUM) 5 MG tablet; Take 1 tablet (5 mg total) by mouth every 12 (twelve) hours as needed. for anxiety  Dispense: 60 tablet; Refill: 3  13. History of pulmonary embolism - CMP14+EGFR  14. Hyperlipidemia, unspecified hyperlipidemia type - CMP14+EGFR - Lipid panel  15. Obesity hypoventilation syndrome (HCC) - CMP14+EGFR  16. Morbid obesity (HCC)  - CMP14+EGFR  17. Pain medication agreement signed - CMP14+EGFR - fentaNYL (DURAGESIC) 75 MCG/HR; Place 1 patch (75 mcg total) onto the skin every 3 (three) days.  Dispense: 10 patch; Refill: 0 - fentaNYL (DURAGESIC) 75 MCG/HR; Place 1 patch (75 mcg total) onto the skin every 3 (three) days.  Dispense: 10 patch; Refill: 0 - fentaNYL (DURAGESIC) 75 MCG/HR; Place 1 patch (75 mcg total) onto the skin every 3 (three) days.  Dispense: 10 patch; Refill: 0 - HYDROcodone-acetaminophen (NORCO) 7.5-325 MG tablet; Take 1 tablet by mouth every 6 (six) hours as needed for moderate pain.  Dispense: 30 tablet; Refill: 0 - HYDROcodone-acetaminophen (NORCO) 7.5-325 MG tablet; Take 1 tablet by mouth daily. As needed for BTP (1 tablet per day)  Dispense: 30 tablet; Refill: 0 - HYDROcodone-acetaminophen (NORCO) 7.5-325 MG tablet; Take 1 tablet by mouth every 6 (six) hours as needed for moderate pain.  Dispense: 30 tablet; Refill: 0  18. Uncomplicated opioid dependence (HCC) - CMP14+EGFR - fentaNYL (DURAGESIC) 75 MCG/HR; Place 1 patch (75 mcg total) onto the skin every 3 (three) days.  Dispense: 10 patch; Refill: 0 - fentaNYL (DURAGESIC) 75 MCG/HR;  Place 1 patch (75 mcg total) onto the skin every 3 (three) days.  Dispense: 10 patch; Refill: 0 - fentaNYL (DURAGESIC) 75 MCG/HR; Place 1 patch (75 mcg total) onto the skin every 3 (three) days.  Dispense: 10 patch; Refill: 0 - HYDROcodone-acetaminophen (NORCO) 7.5-325 MG tablet; Take 1 tablet by mouth every 6 (six) hours as needed for moderate pain.  Dispense: 30 tablet;  Refill: 0 - HYDROcodone-acetaminophen (NORCO) 7.5-325 MG tablet; Take 1 tablet by mouth daily. As needed for BTP (1 tablet per day)  Dispense: 30 tablet; Refill: 0 - HYDROcodone-acetaminophen (NORCO) 7.5-325 MG tablet; Take 1 tablet by mouth every 6 (six) hours as needed for moderate pain.  Dispense: 30 tablet; Refill: 0  19. Vitamin D deficiency - CMP14+EGFR - VITAMIN D 25 Hydroxy (Vit-D Deficiency, Fractures)  20. Testosterone deficiency - CMP14+EGFR  21. 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 Chronic bilateral low back pain with right-sided sciatica - CMP14+EGFR - fentaNYL (DURAGESIC) 75 MCG/HR; Place 1 patch (75 mcg total) onto the skin every 3 (three) days.  Dispense: 10 patch; Refill: 0 - fentaNYL (DURAGESIC) 75 MCG/HR; Place 1 patch (75 mcg total) onto the skin every 3 (three) days.  Dispense: 10 patch; Refill: 0 - fentaNYL (DURAGESIC) 75 MCG/HR; Place 1 patch (75 mcg total) onto the skin every 3 (three) days.  Dispense: 10 patch; Refill: 0 - HYDROcodone-acetaminophen (NORCO) 7.5-325 MG tablet; Take 1 tablet by mouth every 6 (six) hours as needed for moderate pain.  Dispense: 30 tablet; Refill: 0 - HYDROcodone-acetaminophen (NORCO) 7.5-325 MG tablet; Take 1 tablet by mouth daily. As needed for BTP (1 tablet per day)  Dispense: 30 tablet; Refill: 0 - HYDROcodone-acetaminophen (NORCO) 7.5-325 MG tablet; Take 1 tablet by mouth every 6 (six) hours as needed for moderate pain.  Dispense: 30 tablet; Refill: 0   Continue all meds Labs pending Health Maintenance reviewed Diet and exercise encouraged RTO 3 months   Evelina Dun, FNP

## 2016-06-05 ENCOUNTER — Ambulatory Visit: Payer: Self-pay | Admitting: Family

## 2016-06-05 LAB — CMP14+EGFR
ALT: 23 IU/L (ref 0–44)
AST: 35 IU/L (ref 0–40)
Albumin/Globulin Ratio: 1.1 — ABNORMAL LOW (ref 1.2–2.2)
Albumin: 3.5 g/dL (ref 3.5–5.5)
Alkaline Phosphatase: 103 IU/L (ref 39–117)
BUN/Creatinine Ratio: 13 (ref 9–20)
BUN: 22 mg/dL (ref 6–24)
Bilirubin Total: 0.4 mg/dL (ref 0.0–1.2)
CO2: 30 mmol/L — ABNORMAL HIGH (ref 18–29)
Calcium: 8.6 mg/dL — ABNORMAL LOW (ref 8.7–10.2)
Chloride: 97 mmol/L (ref 96–106)
Creatinine, Ser: 1.67 mg/dL — ABNORMAL HIGH (ref 0.76–1.27)
GFR calc Af Amer: 55 mL/min/{1.73_m2} — ABNORMAL LOW (ref >59)
GFR calc non Af Amer: 47 mL/min/{1.73_m2} — ABNORMAL LOW (ref >59)
Globulin, Total: 3.3 g/dL (ref 1.5–4.5)
Glucose: 255 mg/dL — ABNORMAL HIGH (ref 65–99)
Potassium: 4.3 mmol/L (ref 3.5–5.2)
Sodium: 142 mmol/L (ref 134–144)
Total Protein: 6.8 g/dL (ref 6.0–8.5)

## 2016-06-05 LAB — LIPID PANEL
Chol/HDL Ratio: 3.2 ratio units (ref 0.0–5.0)
Cholesterol, Total: 107 mg/dL (ref 100–199)
HDL: 33 mg/dL — ABNORMAL LOW (ref >39)
LDL Calculated: 34 mg/dL (ref 0–99)
Triglycerides: 201 mg/dL — ABNORMAL HIGH (ref 0–149)
VLDL Cholesterol Cal: 40 mg/dL (ref 5–40)

## 2016-06-05 LAB — VITAMIN D 25 HYDROXY (VIT D DEFICIENCY, FRACTURES): Vit D, 25-Hydroxy: 27.2 ng/mL — ABNORMAL LOW (ref 30.0–100.0)

## 2016-06-08 ENCOUNTER — Other Ambulatory Visit: Payer: Self-pay | Admitting: Neurology

## 2016-06-09 ENCOUNTER — Telehealth: Payer: Self-pay | Admitting: *Deleted

## 2016-06-09 ENCOUNTER — Ambulatory Visit: Payer: Medicare Other | Admitting: Family

## 2016-06-09 MED ORDER — LEVETIRACETAM 1000 MG PO TABS: 1000.0000 mg | ORAL_TABLET | Freq: Two times a day (BID) | ORAL | 0 refills | Status: DC

## 2016-06-09 NOTE — Telephone Encounter (Signed)
Wife called stating refill of Keppra was refused. She stated her husband will run out of medication on Sunday. Advised her he needs FU, last seen Nov 2016. Per Angie, billing, also advised of his balance here, to continue making payments. Advised her this RN will refill Keppra x 1 month; he must come for follow up to receive further refills. Advised of late cancel/no show fee as well. Requested he bring insurance information.  Scheduled for 06/16/16; advised to arrive 15 min early to check in. Wife verbalized understanding, appreciation.

## 2016-06-12 DIAGNOSIS — J449 Chronic obstructive pulmonary disease, unspecified: Secondary | ICD-10-CM | POA: Diagnosis not present

## 2016-06-15 ENCOUNTER — Other Ambulatory Visit: Payer: Self-pay | Admitting: Family

## 2016-06-16 ENCOUNTER — Encounter: Payer: Self-pay | Admitting: Neurology

## 2016-06-16 ENCOUNTER — Ambulatory Visit (INDEPENDENT_AMBULATORY_CARE_PROVIDER_SITE_OTHER): Payer: Medicare Other | Admitting: Neurology

## 2016-06-16 VITALS — BP 133/80 | HR 68 | Ht 75.0 in | Wt >= 6400 oz

## 2016-06-16 DIAGNOSIS — R6889 Other general symptoms and signs: Secondary | ICD-10-CM | POA: Diagnosis not present

## 2016-06-16 DIAGNOSIS — G43009 Migraine without aura, not intractable, without status migrainosus: Secondary | ICD-10-CM

## 2016-06-16 DIAGNOSIS — E538 Deficiency of other specified B group vitamins: Secondary | ICD-10-CM | POA: Diagnosis not present

## 2016-06-16 MED ORDER — LEVETIRACETAM 1000 MG PO TABS: 1000.0000 mg | ORAL_TABLET | Freq: Two times a day (BID) | ORAL | 12 refills | Status: DC

## 2016-06-16 MED ORDER — RIZATRIPTAN BENZOATE 10 MG PO TABS: ORAL_TABLET | ORAL | 11 refills | Status: DC

## 2016-06-16 NOTE — Patient Instructions (Addendum)
Remember to drink plenty of fluid, eat healthy meals and do not skip any meals. Try to eat protein with a every meal and eat a healthy snack such as fruit or nuts in between meals. Try to keep a regular sleep-wake schedule and try to exercise daily, particularly in the form of walking, 20-30 minutes a day, if you can.   As far as your medications are concerned, I would like to suggest: Continue current medications, labs  I would like to see you back as needed, sooner if we need to. Please call us with any interim questions, concerns, problems, updates or refill requests.   Our phone number is [phone]. We also have an after hours call service for urgent matters and there is a physician on-call for urgent questions. For any emergencies you know to call 911 or go to the nearest emergency room
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
## 2016-06-17 ENCOUNTER — Telehealth: Payer: Self-pay | Admitting: *Deleted

## 2016-06-17 LAB — TSH: TSH: 2.65 u[IU]/mL (ref 0.450–4.500)

## 2016-06-17 LAB — B12 AND FOLATE PANEL
Folate: 7 ng/mL (ref >3.0)
Vitamin B-12: 1337 pg/mL — ABNORMAL HIGH (ref 232–1245)

## 2016-06-17 NOTE — Telephone Encounter (Signed)
Called and spoke to to pt/wife about lab results per AA,MD note. They verbalized understanding.

## 2016-06-17 NOTE — Telephone Encounter (Signed)
-----   Message from [name], MD sent at 06/17/2016  9:07 AM EST ----- Labs look fine thanks

## 2016-06-24 ENCOUNTER — Other Ambulatory Visit: Payer: Self-pay | Admitting: Family

## 2016-06-24 DIAGNOSIS — E1165 Type 2 diabetes mellitus with hyperglycemia: Secondary | ICD-10-CM

## 2016-06-24 DIAGNOSIS — Z794 Long term (current) use of insulin: Principal | ICD-10-CM

## 2016-07-13 DIAGNOSIS — J449 Chronic obstructive pulmonary disease, unspecified: Secondary | ICD-10-CM | POA: Diagnosis not present

## 2016-07-16 ENCOUNTER — Other Ambulatory Visit: Payer: Self-pay | Admitting: Family

## 2016-07-16 DIAGNOSIS — F411 Generalized anxiety disorder: Secondary | ICD-10-CM

## 2016-07-17 ENCOUNTER — Encounter: Payer: Self-pay | Admitting: Pharmacist Clinician (PhC)/ Clinical Pharmacy Specialist

## 2016-07-24 ENCOUNTER — Ambulatory Visit (INDEPENDENT_AMBULATORY_CARE_PROVIDER_SITE_OTHER): Payer: Medicare Other | Admitting: Pharmacist

## 2016-07-24 DIAGNOSIS — J810 Acute pulmonary edema: Secondary | ICD-10-CM

## 2016-07-24 DIAGNOSIS — I82409 Acute embolism and thrombosis of unspecified deep veins of unspecified lower extremity: Secondary | ICD-10-CM

## 2016-07-24 LAB — COAGUCHEK XS/INR WAIVED
INR: 2.6 — ABNORMAL HIGH (ref 0.9–1.1)
Prothrombin Time: 31.5 s

## 2016-07-24 NOTE — Patient Instructions (Signed)
Anticoagulation Dose Instructions as of 07/24/2016      Dorene Grebe Tue Wed Thu Fri Sat   New Dose 5 mg 5 mg 7.5 mg 5 mg 5 mg 5 mg 7.5 mg    Description   Continue to take warfarin the same way you have been. Take 1.5 tablet on Tuesday and Saturday, and 1 tablet the rest of the week.  INR today was 2.6

## 2016-08-05 ENCOUNTER — Other Ambulatory Visit: Payer: Self-pay | Admitting: Family

## 2016-08-13 DIAGNOSIS — J449 Chronic obstructive pulmonary disease, unspecified: Secondary | ICD-10-CM | POA: Diagnosis not present

## 2016-09-03 ENCOUNTER — Encounter: Payer: Self-pay | Admitting: Family

## 2016-09-03 ENCOUNTER — Ambulatory Visit (INDEPENDENT_AMBULATORY_CARE_PROVIDER_SITE_OTHER): Payer: Medicare Other | Admitting: Family

## 2016-09-03 VITALS — BP 127/80 | HR 67 | Temp 98.0°F

## 2016-09-03 DIAGNOSIS — F1120 Opioid dependence, uncomplicated: Secondary | ICD-10-CM

## 2016-09-03 DIAGNOSIS — I82409 Acute embolism and thrombosis of unspecified deep veins of unspecified lower extremity: Secondary | ICD-10-CM | POA: Diagnosis not present

## 2016-09-03 DIAGNOSIS — Z0289 Encounter for other administrative examinations: Secondary | ICD-10-CM

## 2016-09-03 DIAGNOSIS — G40909 Epilepsy, unspecified, not intractable, without status epilepticus: Secondary | ICD-10-CM

## 2016-09-03 DIAGNOSIS — G894 Chronic pain syndrome: Secondary | ICD-10-CM

## 2016-09-03 DIAGNOSIS — J810 Acute pulmonary edema: Secondary | ICD-10-CM

## 2016-09-03 DIAGNOSIS — G43719 Chronic migraine without aura, intractable, without status migrainosus: Secondary | ICD-10-CM | POA: Diagnosis not present

## 2016-09-03 DIAGNOSIS — N401 Enlarged prostate with lower urinary tract symptoms: Secondary | ICD-10-CM

## 2016-09-03 DIAGNOSIS — M5441 Lumbago with sciatica, right side: Secondary | ICD-10-CM | POA: Diagnosis not present

## 2016-09-03 DIAGNOSIS — F411 Generalized anxiety disorder: Secondary | ICD-10-CM | POA: Diagnosis not present

## 2016-09-03 DIAGNOSIS — G8929 Other chronic pain: Secondary | ICD-10-CM

## 2016-09-03 DIAGNOSIS — E559 Vitamin D deficiency, unspecified: Secondary | ICD-10-CM | POA: Diagnosis not present

## 2016-09-03 DIAGNOSIS — G4733 Obstructive sleep apnea (adult) (pediatric): Secondary | ICD-10-CM

## 2016-09-03 DIAGNOSIS — E785 Hyperlipidemia, unspecified: Secondary | ICD-10-CM

## 2016-09-03 DIAGNOSIS — Z794 Long term (current) use of insulin: Secondary | ICD-10-CM

## 2016-09-03 DIAGNOSIS — E1165 Type 2 diabetes mellitus with hyperglycemia: Secondary | ICD-10-CM | POA: Diagnosis not present

## 2016-09-03 DIAGNOSIS — Z86711 Personal history of pulmonary embolism: Secondary | ICD-10-CM

## 2016-09-03 DIAGNOSIS — E662 Morbid (severe) obesity with alveolar hypoventilation: Secondary | ICD-10-CM

## 2016-09-03 DIAGNOSIS — G4700 Insomnia, unspecified: Secondary | ICD-10-CM

## 2016-09-03 DIAGNOSIS — K2100 Gastro-esophageal reflux disease with esophagitis, without bleeding: Secondary | ICD-10-CM

## 2016-09-03 DIAGNOSIS — E349 Endocrine disorder, unspecified: Secondary | ICD-10-CM

## 2016-09-03 LAB — COAGUCHEK XS/INR WAIVED
INR: 2.3 — ABNORMAL HIGH (ref 0.9–1.1)
Prothrombin Time: 27.7 s

## 2016-09-03 MED ORDER — FENTANYL 75 MCG/HR TD PT72: 75.0000 ug | MEDICATED_PATCH | TRANSDERMAL | 0 refills | Status: DC

## 2016-09-03 MED ORDER — HYDROCODONE-ACETAMINOPHEN 7.5-325 MG PO TABS: 1.0000 | ORAL_TABLET | Freq: Four times a day (QID) | ORAL | 0 refills | Status: DC | PRN

## 2016-09-03 MED ORDER — DIAZEPAM 5 MG PO TABS: 5.0000 mg | ORAL_TABLET | Freq: Two times a day (BID) | ORAL | 3 refills | Status: DC | PRN

## 2016-09-03 MED ORDER — ZOLPIDEM TARTRATE 5 MG PO TABS: 5.0000 mg | ORAL_TABLET | Freq: Every day | ORAL | 3 refills | Status: DC

## 2016-09-03 NOTE — Progress Notes (Signed)
Subjective:    Patient ID: [name], male    DOB: 1966/03/01, 51 y.o.   MRN: [id number]  Pt presents to the office today for chronic follow up. Pt is followed by neurologists every 6 months for migraines and seizures.  Diabetes  He presents for his follow-up diabetic visit. He has type 2 diabetes mellitus. His disease course has been improving. Hypoglycemia symptoms include seizures. Pertinent negatives for hypoglycemia include no confusion, dizziness or nervousness/anxiousness. Associated symptoms include foot paresthesias. Pertinent negatives for diabetes include no blurred vision and no foot ulcerations. There are no hypoglycemic complications. Symptoms are worsening. Diabetic complications include peripheral neuropathy. Pertinent negatives for diabetic complications include no CVA or heart disease. Risk factors for coronary artery disease include diabetes mellitus, dyslipidemia, hypertension, male sex, obesity and sedentary lifestyle. Current diabetic treatment includes insulin injections and oral agent (dual therapy). He is compliant with treatment all of the time. He is following a generally unhealthy diet. His breakfast blood glucose range is generally 110-130 mg/dl. Eye exam is current (07/2015).  Hyperlipidemia  This is a chronic problem. The current episode started more than 1 year ago. The problem is controlled. Recent lipid tests were reviewed and are normal. Exacerbating diseases include diabetes and obesity. He has no history of hypothyroidism. Factors aggravating his hyperlipidemia include fatty foods. Pertinent negatives include no leg pain. Current antihyperlipidemic treatment includes statins. The current treatment provides moderate improvement of lipids. Risk factors for coronary artery disease include diabetes mellitus, dyslipidemia, family history, hypertension, obesity, male sex and a sedentary lifestyle.  Gastroesophageal Reflux  He complains of heartburn. He reports no  wheezing. This is a chronic problem. The current episode started more than 1 year ago. The problem occurs occasionally. The problem has been resolved. The symptoms are aggravated by certain foods. Risk factors include obesity. He has tried a PPI for the symptoms. The treatment provided significant relief.  Anxiety  Presents for follow-up visit. Onset was more than 5 years ago. Symptoms include depressed mood, excessive worry and insomnia. Patient reports no confusion, dizziness, nervous/anxious behavior or panic. Symptoms occur occasionally. The quality of sleep is good.   His past medical history is significant for depression. There is no history of anxiety/panic attacks. Past treatments include SSRIs. The treatment provided moderate relief.  Benign Prostatic Hypertrophy  This is a chronic problem. The current episode started more than 1 year ago. The problem has been resolved since onset. Irritative symptoms include nocturia (3-4 times  ). Obstructive symptoms include straining. Obstructive symptoms do not include an intermittent stream or a weak stream. Pertinent negatives include no hematuria. Past treatments include tamsulosin. The treatment provided significant relief.  Seizures   This is a chronic problem. The current episode started more than 1 week ago. Progression since onset: Pt states he usually has seziures at night, and has them every few weeks. Pt states his last one was last week-Pt is followed by neurologists every few months. Pertinent negatives include no confusion. Characteristics do not include bowel incontinence or bladder incontinence.  Migraine   This is a chronic (pt is followed by nuerologists every few month- Pt was getting lidocaine injections) problem. The current episode started more than 1 year ago. The problem occurs intermittently. The problem has been gradually improving. The pain does not radiate. The pain quality is similar to prior headaches. The quality of the pain is  described as aching. The pain is at a severity of 9/10. The pain is moderate. Associated symptoms include  back pain, insomnia, photophobia and seizures. Pertinent negatives include no blurred vision or dizziness. His past medical history is significant for obesity.  Back Pain  This is a chronic problem. The current episode started more than 1 year ago. The problem occurs constantly. The problem has been waxing and waning since onset. The pain is present in the lumbar spine. The quality of the pain is described as aching. The pain is at a severity of 3/10. The pain is moderate. Pertinent negatives include no bladder incontinence, bowel incontinence or leg pain. Risk factors include obesity. He has tried analgesics and NSAIDs for the symptoms. The treatment provided mild relief.  Insomnia  Primary symptoms: difficulty falling asleep, frequent awakening.  The current episode started more than one year. The onset quality is gradual. The problem has been waxing and waning since onset.  OSA Pt states he does not use CPAP at night. States he uses 2L oxygen at night.   Hx DVT & PE, Chronic anticoagulation PT currently taking warfarin. See anticoagulation flow sheet.  Testosterone Deficiency PT taking testosterone. Stable.  Pain assessment: Pain location- Chronic back pain Pain on scale of 1-10- 2-3 Frequency- Constantly What increases pain-Standing What makes pain Better-Pain medication  Pill count performed-No Urine drug screen- No,  08/02/15 Was the [facility] reviewed- Yes  If yes were their any concerning findings? -Only received controlled medications from me.    Review of Systems  Constitutional: Negative.   Eyes: Positive for photophobia. Negative for blurred vision.  Respiratory: Negative for wheezing.   Cardiovascular: Negative.   Gastrointestinal: Positive for heartburn. Negative for bowel incontinence.  Endocrine: Negative.   Genitourinary: Positive for nocturia (3-4 times  ). Negative  for bladder incontinence and hematuria.  Musculoskeletal: Positive for back pain.  Neurological: Positive for seizures. Negative for dizziness.  Hematological: Negative.   Psychiatric/Behavioral: Negative for confusion. The patient has insomnia. The patient is not nervous/anxious.   All other systems reviewed and are negative.      Objective:   Physical Exam  Constitutional: He is oriented to person, place, and time. He appears well-developed and well-nourished. No distress.  Morbid obese  HENT:  Head: Normocephalic.  Right Ear: External ear normal.  Left Ear: External ear normal.  Nose: Nose normal.  Mouth/Throat: Oropharynx is clear and moist.  Eyes: Pupils are equal, round, and reactive to light. Right eye exhibits no discharge. Left eye exhibits no discharge.  Neck: Normal range of motion. Neck supple. No thyromegaly present.  Cardiovascular: Normal rate, regular rhythm, normal heart sounds and intact distal pulses.   No murmur heard. Pulmonary/Chest: Effort normal and breath sounds normal. No respiratory distress. He has no wheezes.  Abdominal: Soft. Bowel sounds are normal. He exhibits no distension. There is no tenderness.  Musculoskeletal: Normal range of motion. He exhibits edema (trace amt). He exhibits no tenderness.  Pt in electric wheelchair   Neurological: He is alert and oriented to person, place, and time. No cranial nerve deficit.  Skin: Skin is warm and dry. No rash noted. No erythema.  Psychiatric: He has a normal mood and affect. His behavior is normal. Judgment and thought content normal.  Vitals reviewed.     BP 127/80   Pulse 67   Temp 98 F (36.7 C) (Oral)      Assessment & Plan:  1. GAD (generalized anxiety disorder) - CMP14+EGFR - diazepam (VALIUM) 5 MG tablet; Take 1 tablet (5 mg total) by mouth every 12 (twelve) hours as needed. for anxiety  Dispense: 60 tablet; Refill: 3  2. Chronic pain syndrome - CMP14+EGFR - fentaNYL (DURAGESIC) 75  MCG/HR; Place 1 patch (75 mcg total) onto the skin every 3 (three) days.  Dispense: 10 patch; Refill: 0 - HYDROcodone-acetaminophen (NORCO) 7.5-325 MG tablet; Take 1 tablet by mouth every 6 (six) hours as needed for moderate pain.  Dispense: 30 tablet; Refill: 0 - fentaNYL (DURAGESIC - DOSED MCG/HR) 75 MCG/HR; Place 1 patch (75 mcg total) onto the skin every 3 (three) days.  Dispense: 10 patch; Refill: 0 - fentaNYL (DURAGESIC - DOSED MCG/HR) 75 MCG/HR; Place 1 patch (75 mcg total) onto the skin every 3 (three) days.  Dispense: 10 patch; Refill: 0 - HYDROcodone-acetaminophen (NORCO) 7.5-325 MG tablet; Take 1 tablet by mouth every 6 (six) hours as needed for moderate pain.  Dispense: 30 tablet; Refill: 0 - HYDROcodone-acetaminophen (NORCO) 7.5-325 MG tablet; Take 1 tablet by mouth every 6 (six) hours as needed for moderate pain.  Dispense: 30 tablet; Refill: 0  3. Pain medication agreement signed - CMP14+EGFR - fentaNYL (DURAGESIC) 75 MCG/HR; Place 1 patch (75 mcg total) onto the skin every 3 (three) days.  Dispense: 10 patch; Refill: 0 - HYDROcodone-acetaminophen (NORCO) 7.5-325 MG tablet; Take 1 tablet by mouth every 6 (six) hours as needed for moderate pain.  Dispense: 30 tablet; Refill: 0 - fentaNYL (DURAGESIC - DOSED MCG/HR) 75 MCG/HR; Place 1 patch (75 mcg total) onto the skin every 3 (three) days.  Dispense: 10 patch; Refill: 0 - fentaNYL (DURAGESIC - DOSED MCG/HR) 75 MCG/HR; Place 1 patch (75 mcg total) onto the skin every 3 (three) days.  Dispense: 10 patch; Refill: 0 - HYDROcodone-acetaminophen (NORCO) 7.5-325 MG tablet; Take 1 tablet by mouth every 6 (six) hours as needed for moderate pain.  Dispense: 30 tablet; Refill: 0 - HYDROcodone-acetaminophen (NORCO) 7.5-325 MG tablet; Take 1 tablet by mouth every 6 (six) hours as needed for moderate pain.  Dispense: 30 tablet; Refill: 0  4. Uncomplicated opioid dependence (HCC) - CMP14+EGFR - fentaNYL (DURAGESIC) 75 MCG/HR; Place 1 patch (75 mcg  total) onto the skin every 3 (three) days.  Dispense: 10 patch; Refill: 0 - HYDROcodone-acetaminophen (NORCO) 7.5-325 MG tablet; Take 1 tablet by mouth every 6 (six) hours as needed for moderate pain.  Dispense: 30 tablet; Refill: 0 - fentaNYL (DURAGESIC - DOSED MCG/HR) 75 MCG/HR; Place 1 patch (75 mcg total) onto the skin every 3 (three) days.  Dispense: 10 patch; Refill: 0 - fentaNYL (DURAGESIC - DOSED MCG/HR) 75 MCG/HR; Place 1 patch (75 mcg total) onto the skin every 3 (three) days.  Dispense: 10 patch; Refill: 0 - HYDROcodone-acetaminophen (NORCO) 7.5-325 MG tablet; Take 1 tablet by mouth every 6 (six) hours as needed for moderate pain.  Dispense: 30 tablet; Refill: 0 - HYDROcodone-acetaminophen (NORCO) 7.5-325 MG tablet; Take 1 tablet by mouth every 6 (six) hours as needed for moderate pain.  Dispense: 30 tablet; Refill: 0  5. Chronic bilateral low back pain with right-sided sciatica - CMP14+EGFR - fentaNYL (DURAGESIC) 75 MCG/HR; Place 1 patch (75 mcg total) onto the skin every 3 (three) days.  Dispense: 10 patch; Refill: 0 - HYDROcodone-acetaminophen (NORCO) 7.5-325 MG tablet; Take 1 tablet by mouth every 6 (six) hours as needed for moderate pain.  Dispense: 30 tablet; Refill: 0 - fentaNYL (DURAGESIC - DOSED MCG/HR) 75 MCG/HR; Place 1 patch (75 mcg total) onto the skin every 3 (three) days.  Dispense: 10 patch; Refill: 0 - fentaNYL (DURAGESIC - DOSED MCG/HR) 75 MCG/HR; Place 1 patch (75 mcg  total) onto the skin every 3 (three) days.  Dispense: 10 patch; Refill: 0 - HYDROcodone-acetaminophen (NORCO) 7.5-325 MG tablet; Take 1 tablet by mouth every 6 (six) hours as needed for moderate pain.  Dispense: 30 tablet; Refill: 0 - HYDROcodone-acetaminophen (NORCO) 7.5-325 MG tablet; Take 1 tablet by mouth every 6 (six) hours as needed for moderate pain.  Dispense: 30 tablet; Refill: 0  6. OSA (obstructive sleep apnea) - CMP14+EGFR  7. Intractable chronic migraine without aura and without status  migrainosus - CMP14+EGFR  8. Gastroesophageal reflux disease with esophagitis - CMP14+EGFR  9. Type 2 diabetes mellitus with hyperglycemia, with long-term current use of insulin (HCC) - CMP14+EGFR - Microalbumin / creatinine urine ratio  10. Seizure disorder ([place]) - CMP14+EGFR  11. Benign prostatic hyperplasia with lower urinary tract symptoms, symptom details unspecified - CMP14+EGFR  12. Vitamin D deficiency - CMP14+EGFR - VITAMIN D 25 Hydroxy (Vit-D Deficiency, Fractures)  13. Testosterone deficiency - CMP14+EGFR  14. Obesity hypoventilation syndrome (HCC) - CMP14+EGFR  15. Morbid obesity (HCC) - CMP14+EGFR  16. Hyperlipidemia, unspecified hyperlipidemia type - CMP14+EGFR - Lipid panel  17. History of pulmonary embolism - CMP14+EGFR  18. Insomnia, unspecified type - CMP14+EGFR - zolpidem (AMBIEN) 5 MG tablet; Take 1 tablet (5 mg total) by mouth at bedtime.  Dispense: 30 tablet; Refill: 3    Continue all meds Labs pending Health Maintenance reviewed Diet and exercise encouraged RTO 3 months   Evelina Dun, FNP

## 2016-09-03 NOTE — Addendum Note (Signed)
Addended by: Earlene Plater on: 09/03/2016 01:13 PM   Modules accepted: Orders

## 2016-09-03 NOTE — Patient Instructions (Signed)
Diabetes Mellitus and Food It is important for you to manage your blood sugar (glucose) level. Your blood glucose level can be greatly affected by what you eat. Eating healthier foods in the appropriate amounts throughout the day at about the same time each day will help you control your blood glucose level. It can also help slow or prevent worsening of your diabetes mellitus. Healthy eating may even help you improve the level of your blood pressure and reach or maintain a healthy weight. General recommendations for healthful eating and cooking habits include:  Eating meals and snacks regularly. Avoid going long periods of time without eating to lose weight.  Eating a diet that consists mainly of plant-based foods, such as fruits, vegetables, nuts, legumes, and whole grains.  Using low-heat cooking methods, such as baking, instead of high-heat cooking methods, such as deep frying.  Work with your dietitian to make sure you understand how to use the Nutrition Facts information on food labels. How can food affect me? Carbohydrates Carbohydrates affect your blood glucose level more than any other type of food. Your dietitian will help you determine how many carbohydrates to eat at each meal and teach you how to count carbohydrates. Counting carbohydrates is important to keep your blood glucose at a healthy level, especially if you are using insulin or taking certain medicines for diabetes mellitus. Alcohol Alcohol can cause sudden decreases in blood glucose (hypoglycemia), especially if you use insulin or take certain medicines for diabetes mellitus. Hypoglycemia can be a life-threatening condition. Symptoms of hypoglycemia (sleepiness, dizziness, and disorientation) are similar to symptoms of having too much alcohol. If your health care provider has given you approval to drink alcohol, do so in moderation and use the following guidelines:  Women should not have more than one drink per day, and men  should not have more than two drinks per day. One drink is equal to: ? 12 oz of beer. ? 5 oz of wine. ? 1 oz of hard liquor.  Do not drink on an empty stomach.  Keep yourself hydrated. Have water, diet soda, or unsweetened iced tea.  Regular soda, juice, and other mixers might contain a lot of carbohydrates and should be counted.  What foods are not recommended? As you make food choices, it is important to remember that all foods are not the same. Some foods have fewer nutrients per serving than other foods, even though they might have the same number of calories or carbohydrates. It is difficult to get your body what it needs when you eat foods with fewer nutrients. Examples of foods that you should avoid that are high in calories and carbohydrates but low in nutrients include:  Trans fats (most processed foods list trans fats on the Nutrition Facts label).  Regular soda.  Juice.  Candy.  Sweets, such as cake, pie, doughnuts, and cookies.  Fried foods.  What foods can I eat? Eat nutrient-rich foods, which will nourish your body and keep you healthy. The food you should eat also will depend on several factors, including:  The calories you need.  The medicines you take.  Your weight.  Your blood glucose level.  Your blood pressure level.  Your cholesterol level.  You should eat a variety of foods, including:  Protein. ? Lean cuts of meat. ? Proteins low in saturated fats, such as fish, egg whites, and beans. Avoid processed meats.  Fruits and vegetables. ? Fruits and vegetables that may help control blood glucose levels, such as apples,   mangoes, and yams.  Dairy products. ? Choose fat-free or low-fat dairy products, such as milk, yogurt, and cheese.  Grains, bread, pasta, and rice. ? Choose whole grain products, such as multigrain bread, whole oats, and brown rice. These foods may help control blood pressure.  Fats. ? Foods containing healthful fats, such as  nuts, avocado, olive oil, canola oil, and fish.  Does everyone with diabetes mellitus have the same meal plan? Because every person with diabetes mellitus is different, there is not one meal plan that works for everyone. It is very important that you meet with a dietitian who will help you create a meal plan that is just right for you. This information is not intended to replace advice given to you by your health care provider. Make sure you discuss any questions you have with your health care provider. Document Released: 03/12/2005 Document Revised: 11/21/2015 Document Reviewed: 05/12/2013 Elsevier Interactive Patient Education  2017 [employer]  

## 2016-09-04 ENCOUNTER — Other Ambulatory Visit: Payer: Self-pay | Admitting: *Deleted

## 2016-09-04 DIAGNOSIS — E1165 Type 2 diabetes mellitus with hyperglycemia: Secondary | ICD-10-CM

## 2016-09-04 DIAGNOSIS — Z794 Long term (current) use of insulin: Secondary | ICD-10-CM | POA: Diagnosis not present

## 2016-09-04 LAB — LIPID PANEL
Chol/HDL Ratio: 2.9 ratio units (ref 0.0–5.0)
Cholesterol, Total: 101 mg/dL (ref 100–199)
HDL: 35 mg/dL — ABNORMAL LOW (ref >39)
LDL Calculated: 31 mg/dL (ref 0–99)
Triglycerides: 173 mg/dL — ABNORMAL HIGH (ref 0–149)
VLDL Cholesterol Cal: 35 mg/dL (ref 5–40)

## 2016-09-04 LAB — CMP14+EGFR
ALT: 23 IU/L (ref 0–44)
AST: 35 IU/L (ref 0–40)
Albumin/Globulin Ratio: 1 — ABNORMAL LOW (ref 1.2–2.2)
Albumin: 3.4 g/dL — ABNORMAL LOW (ref 3.5–5.5)
Alkaline Phosphatase: 99 IU/L (ref 39–117)
BUN/Creatinine Ratio: 17 (ref 9–20)
BUN: 28 mg/dL — ABNORMAL HIGH (ref 6–24)
Bilirubin Total: 0.4 mg/dL (ref 0.0–1.2)
CO2: 29 mmol/L (ref 18–29)
Calcium: 8.6 mg/dL — ABNORMAL LOW (ref 8.7–10.2)
Chloride: 98 mmol/L (ref 96–106)
Creatinine, Ser: 1.67 mg/dL — ABNORMAL HIGH (ref 0.76–1.27)
GFR calc Af Amer: 55 mL/min/{1.73_m2} — ABNORMAL LOW (ref >59)
GFR calc non Af Amer: 47 mL/min/{1.73_m2} — ABNORMAL LOW (ref >59)
Globulin, Total: 3.4 g/dL (ref 1.5–4.5)
Glucose: 136 mg/dL — ABNORMAL HIGH (ref 65–99)
Potassium: 4.4 mmol/L (ref 3.5–5.2)
Sodium: 140 mmol/L (ref 134–144)
Total Protein: 6.8 g/dL (ref 6.0–8.5)

## 2016-09-04 LAB — BAYER DCA HB A1C WAIVED: HB A1C (BAYER DCA - WAIVED): 7.6 % — ABNORMAL HIGH (ref <7.0)

## 2016-09-04 LAB — VITAMIN D 25 HYDROXY (VIT D DEFICIENCY, FRACTURES): Vit D, 25-Hydroxy: 29.8 ng/mL — ABNORMAL LOW (ref 30.0–100.0)

## 2016-09-04 NOTE — Addendum Note (Signed)
Addended by: Liliane Bade on: 09/04/2016 04:42 PM   Modules accepted: Orders

## 2016-09-04 NOTE — Addendum Note (Signed)
Addended by: Jamelle Haring on: 09/04/2016 04:40 PM   Modules accepted: Orders

## 2016-09-10 DIAGNOSIS — J449 Chronic obstructive pulmonary disease, unspecified: Secondary | ICD-10-CM | POA: Diagnosis not present

## 2016-09-20 ENCOUNTER — Other Ambulatory Visit: Payer: Self-pay | Admitting: Family

## 2016-09-28 ENCOUNTER — Other Ambulatory Visit: Payer: Self-pay | Admitting: Family

## 2016-09-29 ENCOUNTER — Other Ambulatory Visit: Payer: Self-pay | Admitting: Family

## 2016-09-29 DIAGNOSIS — F411 Generalized anxiety disorder: Secondary | ICD-10-CM

## 2016-09-29 DIAGNOSIS — F329 Major depressive disorder, single episode, unspecified: Secondary | ICD-10-CM

## 2016-09-29 DIAGNOSIS — E1165 Type 2 diabetes mellitus with hyperglycemia: Secondary | ICD-10-CM

## 2016-09-29 DIAGNOSIS — F32A Depression, unspecified: Secondary | ICD-10-CM

## 2016-09-29 DIAGNOSIS — Z794 Long term (current) use of insulin: Secondary | ICD-10-CM

## 2016-09-30 NOTE — Telephone Encounter (Signed)
Go ahead and call in refills for Lyrica and we'll send the rest.

## 2016-10-07 ENCOUNTER — Emergency Department (HOSPITAL_COMMUNITY)
Admission: EM | Admit: 2016-10-07 | Discharge: 2016-10-07 | Disposition: A | Discharge status: Home / Self Care | Payer: Medicare Other | Attending: Emergency Medicine | Admitting: Emergency Medicine

## 2016-10-07 ENCOUNTER — Emergency Department (HOSPITAL_COMMUNITY): Payer: Medicare Other

## 2016-10-07 ENCOUNTER — Encounter (HOSPITAL_COMMUNITY): Payer: Self-pay | Admitting: *Deleted

## 2016-10-07 DIAGNOSIS — R935 Abnormal findings on diagnostic imaging of other abdominal regions, including retroperitoneum: Secondary | ICD-10-CM | POA: Diagnosis not present

## 2016-10-07 DIAGNOSIS — N189 Chronic kidney disease, unspecified: Secondary | ICD-10-CM | POA: Insufficient documentation

## 2016-10-07 DIAGNOSIS — Z794 Long term (current) use of insulin: Secondary | ICD-10-CM | POA: Diagnosis not present

## 2016-10-07 DIAGNOSIS — R1084 Generalized abdominal pain: Secondary | ICD-10-CM | POA: Diagnosis not present

## 2016-10-07 DIAGNOSIS — E1140 Type 2 diabetes mellitus with diabetic neuropathy, unspecified: Secondary | ICD-10-CM | POA: Diagnosis not present

## 2016-10-07 DIAGNOSIS — Z91040 Latex allergy status: Secondary | ICD-10-CM | POA: Diagnosis not present

## 2016-10-07 DIAGNOSIS — R609 Edema, unspecified: Secondary | ICD-10-CM | POA: Diagnosis not present

## 2016-10-07 DIAGNOSIS — E1122 Type 2 diabetes mellitus with diabetic chronic kidney disease: Secondary | ICD-10-CM | POA: Diagnosis not present

## 2016-10-07 DIAGNOSIS — R1030 Lower abdominal pain, unspecified: Secondary | ICD-10-CM | POA: Diagnosis present

## 2016-10-07 DIAGNOSIS — R197 Diarrhea, unspecified: Secondary | ICD-10-CM | POA: Insufficient documentation

## 2016-10-07 LAB — URINALYSIS, ROUTINE W REFLEX MICROSCOPIC
Bacteria, UA: NONE SEEN
Bilirubin Urine: NEGATIVE
Glucose, UA: >=500 mg/dL — AB
Hgb urine dipstick: NEGATIVE
Ketones, ur: NEGATIVE mg/dL
Leukocytes, UA: NEGATIVE
Nitrite: NEGATIVE
Protein, ur: 100 mg/dL — AB
Specific Gravity, Urine: 1.013 (ref 1.005–1.030)
Squamous Epithelial / LPF: NONE SEEN
pH: 7 (ref 5.0–8.0)

## 2016-10-07 LAB — COMPREHENSIVE METABOLIC PANEL
ALT: 27 U/L (ref 17–63)
AST: 40 U/L (ref 15–41)
Albumin: 3.1 g/dL — ABNORMAL LOW (ref 3.5–5.0)
Alkaline Phosphatase: 92 U/L (ref 38–126)
Anion gap: 8 (ref 5–15)
BUN: 26 mg/dL — ABNORMAL HIGH (ref 6–20)
CO2: 27 mmol/L (ref 22–32)
Calcium: 8.6 mg/dL — ABNORMAL LOW (ref 8.9–10.3)
Chloride: 102 mmol/L (ref 101–111)
Creatinine, Ser: 1.63 mg/dL — ABNORMAL HIGH (ref 0.61–1.24)
GFR calc Af Amer: 55 mL/min — ABNORMAL LOW (ref >60)
GFR calc non Af Amer: 48 mL/min — ABNORMAL LOW (ref >60)
Glucose, Bld: 163 mg/dL — ABNORMAL HIGH (ref 65–99)
Potassium: 4.4 mmol/L (ref 3.5–5.1)
Sodium: 137 mmol/L (ref 135–145)
Total Bilirubin: 0.8 mg/dL (ref 0.3–1.2)
Total Protein: 7.2 g/dL (ref 6.5–8.1)

## 2016-10-07 LAB — CBC
HCT: 41.1 % (ref 39.0–52.0)
Hemoglobin: 13.1 g/dL (ref 13.0–17.0)
MCH: 28.1 pg (ref 26.0–34.0)
MCHC: 31.9 g/dL (ref 30.0–36.0)
MCV: 88 fL (ref 78.0–100.0)
Platelets: 94 10*3/uL — ABNORMAL LOW (ref 150–400)
RBC: 4.67 MIL/uL (ref 4.22–5.81)
RDW: 15.6 % — ABNORMAL HIGH (ref 11.5–15.5)
WBC: 4.6 10*3/uL (ref 4.0–10.5)

## 2016-10-07 LAB — PROTIME-INR
INR: 2.37
Prothrombin Time: 26.3 seconds — ABNORMAL HIGH (ref 11.4–15.2)

## 2016-10-07 LAB — LIPASE, BLOOD: Lipase: 20 U/L (ref 11–51)

## 2016-10-07 MED ORDER — SODIUM CHLORIDE 0.9 % IV BOLUS (SEPSIS)
1000.0000 mL | Freq: Once | INTRAVENOUS | Status: AC
  Administered 2016-10-07: 1000 mL via INTRAVENOUS

## 2016-10-07 NOTE — ED Triage Notes (Signed)
Diarrhea and abd pain since yesterday. Multiple stools

## 2016-10-07 NOTE — Discharge Instructions (Signed)
START TAKING LACTOBACILLUS/PROBIOTICS TO HELP WITH DIARRHEA. RETURN TO ER IF YOU HAVE ANY BLOODY STOOLS, WORSENING ABDOMINAL PAIN OR FEVER.
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
## 2016-10-07 NOTE — ED Notes (Signed)
Pt is aware that a urine sample is needed and has a urinal at the bedside

## 2016-10-11 DIAGNOSIS — J449 Chronic obstructive pulmonary disease, unspecified: Secondary | ICD-10-CM | POA: Diagnosis not present

## 2016-10-16 ENCOUNTER — Encounter: Payer: Self-pay | Admitting: Pharmacist Clinician (PhC)/ Clinical Pharmacy Specialist

## 2016-10-17 ENCOUNTER — Other Ambulatory Visit: Payer: Self-pay | Admitting: Family

## 2016-10-20 ENCOUNTER — Other Ambulatory Visit: Payer: Self-pay | Admitting: Pharmacist

## 2016-10-20 ENCOUNTER — Other Ambulatory Visit: Payer: Self-pay | Admitting: Family

## 2016-10-23 ENCOUNTER — Encounter: Payer: Self-pay | Admitting: Pharmacist Clinician (PhC)/ Clinical Pharmacy Specialist

## 2016-11-03 ENCOUNTER — Encounter: Payer: Self-pay | Admitting: Pharmacist

## 2016-11-04 ENCOUNTER — Telehealth: Payer: Self-pay | Admitting: Family Medicine

## 2016-11-04 ENCOUNTER — Other Ambulatory Visit: Payer: Self-pay

## 2016-11-04 ENCOUNTER — Encounter: Payer: Self-pay | Admitting: Family

## 2016-11-04 ENCOUNTER — Encounter: Payer: Self-pay | Admitting: Pharmacist

## 2016-11-04 DIAGNOSIS — Z794 Long term (current) use of insulin: Principal | ICD-10-CM

## 2016-11-04 DIAGNOSIS — E1165 Type 2 diabetes mellitus with hyperglycemia: Secondary | ICD-10-CM

## 2016-11-04 MED ORDER — DULAGLUTIDE 1.5 MG/0.5ML ~~LOC~~ SOAJ: SUBCUTANEOUS | 2 refills | Status: DC

## 2016-11-04 NOTE — Telephone Encounter (Signed)
Done by Leafy Ro

## 2016-11-09 ENCOUNTER — Ambulatory Visit (INDEPENDENT_AMBULATORY_CARE_PROVIDER_SITE_OTHER): Payer: Medicare Other | Admitting: Pharmacist

## 2016-11-09 DIAGNOSIS — J810 Acute pulmonary edema: Secondary | ICD-10-CM

## 2016-11-09 DIAGNOSIS — I82409 Acute embolism and thrombosis of unspecified deep veins of unspecified lower extremity: Secondary | ICD-10-CM | POA: Diagnosis not present

## 2016-11-09 DIAGNOSIS — Z0289 Encounter for other administrative examinations: Secondary | ICD-10-CM

## 2016-11-09 DIAGNOSIS — M5441 Lumbago with sciatica, right side: Secondary | ICD-10-CM

## 2016-11-09 DIAGNOSIS — G8929 Other chronic pain: Secondary | ICD-10-CM

## 2016-11-09 DIAGNOSIS — Z7901 Long term (current) use of anticoagulants: Secondary | ICD-10-CM

## 2016-11-09 DIAGNOSIS — F1120 Opioid dependence, uncomplicated: Secondary | ICD-10-CM

## 2016-11-09 DIAGNOSIS — Z86711 Personal history of pulmonary embolism: Secondary | ICD-10-CM | POA: Diagnosis not present

## 2016-11-09 DIAGNOSIS — G894 Chronic pain syndrome: Secondary | ICD-10-CM

## 2016-11-09 LAB — COAGUCHEK XS/INR WAIVED
INR: 2.3 — ABNORMAL HIGH (ref 0.9–1.1)
Prothrombin Time: 27.1 s

## 2016-11-09 MED ORDER — HYDROCODONE-ACETAMINOPHEN 7.5-325 MG PO TABS: 1.0000 | ORAL_TABLET | Freq: Four times a day (QID) | ORAL | 0 refills | Status: DC | PRN

## 2016-11-09 MED ORDER — FENTANYL 75 MCG/HR TD PT72: 75.0000 ug | MEDICATED_PATCH | TRANSDERMAL | 0 refills | Status: DC

## 2016-11-10 DIAGNOSIS — J449 Chronic obstructive pulmonary disease, unspecified: Secondary | ICD-10-CM | POA: Diagnosis not present

## 2016-12-05 ENCOUNTER — Other Ambulatory Visit: Payer: Self-pay | Admitting: Family

## 2016-12-11 ENCOUNTER — Ambulatory Visit: Payer: Medicare Other | Admitting: Family

## 2016-12-11 DIAGNOSIS — J449 Chronic obstructive pulmonary disease, unspecified: Secondary | ICD-10-CM | POA: Diagnosis not present

## 2016-12-18 ENCOUNTER — Ambulatory Visit (INDEPENDENT_AMBULATORY_CARE_PROVIDER_SITE_OTHER): Payer: Medicare Other | Admitting: Family

## 2016-12-18 ENCOUNTER — Encounter: Payer: Self-pay | Admitting: Family

## 2016-12-18 VITALS — BP 133/82 | HR 56 | Temp 98.4°F

## 2016-12-18 DIAGNOSIS — N401 Enlarged prostate with lower urinary tract symptoms: Secondary | ICD-10-CM

## 2016-12-18 DIAGNOSIS — K2100 Gastro-esophageal reflux disease with esophagitis, without bleeding: Secondary | ICD-10-CM

## 2016-12-18 DIAGNOSIS — F411 Generalized anxiety disorder: Secondary | ICD-10-CM

## 2016-12-18 DIAGNOSIS — M5441 Lumbago with sciatica, right side: Secondary | ICD-10-CM

## 2016-12-18 DIAGNOSIS — G40909 Epilepsy, unspecified, not intractable, without status epilepticus: Secondary | ICD-10-CM | POA: Diagnosis not present

## 2016-12-18 DIAGNOSIS — F1120 Opioid dependence, uncomplicated: Secondary | ICD-10-CM

## 2016-12-18 DIAGNOSIS — Z794 Long term (current) use of insulin: Secondary | ICD-10-CM | POA: Diagnosis not present

## 2016-12-18 DIAGNOSIS — E785 Hyperlipidemia, unspecified: Secondary | ICD-10-CM

## 2016-12-18 DIAGNOSIS — Z7901 Long term (current) use of anticoagulants: Secondary | ICD-10-CM | POA: Diagnosis not present

## 2016-12-18 DIAGNOSIS — G894 Chronic pain syndrome: Secondary | ICD-10-CM

## 2016-12-18 DIAGNOSIS — E559 Vitamin D deficiency, unspecified: Secondary | ICD-10-CM

## 2016-12-18 DIAGNOSIS — Z1211 Encounter for screening for malignant neoplasm of colon: Secondary | ICD-10-CM

## 2016-12-18 DIAGNOSIS — E1165 Type 2 diabetes mellitus with hyperglycemia: Secondary | ICD-10-CM

## 2016-12-18 DIAGNOSIS — G8929 Other chronic pain: Secondary | ICD-10-CM

## 2016-12-18 DIAGNOSIS — E662 Morbid (severe) obesity with alveolar hypoventilation: Secondary | ICD-10-CM

## 2016-12-18 DIAGNOSIS — D649 Anemia, unspecified: Secondary | ICD-10-CM

## 2016-12-18 DIAGNOSIS — J810 Acute pulmonary edema: Secondary | ICD-10-CM

## 2016-12-18 DIAGNOSIS — Z0289 Encounter for other administrative examinations: Secondary | ICD-10-CM

## 2016-12-18 LAB — COAGUCHEK XS/INR WAIVED
INR: 3.3 — ABNORMAL HIGH (ref 0.9–1.1)
Prothrombin Time: 40 s

## 2016-12-18 LAB — BAYER DCA HB A1C WAIVED: HB A1C (BAYER DCA - WAIVED): 7 % — ABNORMAL HIGH (ref <7.0)

## 2016-12-18 MED ORDER — FENTANYL 75 MCG/HR TD PT72: 75.0000 ug | MEDICATED_PATCH | TRANSDERMAL | 0 refills | Status: DC

## 2016-12-18 MED ORDER — HYDROCODONE-ACETAMINOPHEN 7.5-325 MG PO TABS: 1.0000 | ORAL_TABLET | Freq: Four times a day (QID) | ORAL | 0 refills | Status: DC | PRN

## 2016-12-18 MED ORDER — DIAZEPAM 5 MG PO TABS: 5.0000 mg | ORAL_TABLET | Freq: Two times a day (BID) | ORAL | 5 refills | Status: DC | PRN

## 2016-12-18 NOTE — Patient Instructions (Signed)

## 2016-12-18 NOTE — Progress Notes (Signed)
Subjective:    Patient ID: [name], male    DOB: 07-05-1965, 50 y.o.   MRN: [id number]   Diabetes  He presents for his follow-up diabetic visit. He has type 2 diabetes mellitus. His disease course has been stable. Hypoglycemia symptoms include nervousness/anxiousness and seizures. Associated symptoms include foot paresthesias. Pertinent negatives for diabetes include no blurred vision, no foot ulcerations and no visual change. Symptoms are stable. Diabetic complications include peripheral neuropathy. Pertinent negatives for diabetic complications include no CVA. Risk factors for coronary artery disease include diabetes mellitus, dyslipidemia, male sex and obesity. He is following a generally unhealthy diet. His breakfast blood glucose range is generally 90-110 mg/dl.  Gastroesophageal Reflux  He complains of belching and heartburn. He reports no coughing. This is a chronic problem. The current episode started more than 1 year ago. The problem occurs frequently. The problem has been waxing and waning. He has tried a PPI for the symptoms. The treatment provided moderate relief.  Arthritis  Presents for follow-up visit. He complains of pain and stiffness. The symptoms have been stable. Affected locations include the right knee and left knee (back). His pain is at a severity of 9/10.  Anemia  Presents for follow-up visit. Symptoms include malaise/fatigue.  Anxiety  Presents for follow-up visit. Symptoms include decreased concentration, excessive worry, irritability and nervous/anxious behavior. Symptoms occur most days.   His past medical history is significant for anemia.  Benign Prostatic Hypertrophy  This is a chronic problem. The current episode started more than 1 year ago. The problem has been waxing and waning since onset. Irritative symptoms include nocturia. Associated symptoms include hesitancy.  Seizures   This is a chronic problem. The problem has been rapidly improving.  Pertinent negatives include no cough.  Hyperlipidemia  This is a chronic problem. The current episode started more than 1 year ago. The problem is controlled. Recent lipid tests were reviewed and are normal. Exacerbating diseases include obesity. Current antihyperlipidemic treatment includes statins. The current treatment provides moderate improvement of lipids.      Review of Systems  Constitutional: Positive for irritability and malaise/fatigue.  Eyes: Negative for blurred vision.  Respiratory: Negative for cough.   Gastrointestinal: Positive for heartburn.  Genitourinary: Positive for hesitancy and nocturia.  Musculoskeletal: Positive for arthritis and stiffness.  Neurological: Positive for seizures.  Psychiatric/Behavioral: Positive for decreased concentration. The patient is nervous/anxious.   All other systems reviewed and are negative.      Objective:   Physical Exam  Constitutional: He is oriented to person, place, and time. He appears well-developed and well-nourished. No distress.  Morbid obese   HENT:  Head: Normocephalic.  Right Ear: External ear normal.  Left Ear: External ear normal.  Mouth/Throat: Oropharynx is clear and moist.  Eyes: Pupils are equal, round, and reactive to light. Right eye exhibits no discharge. Left eye exhibits no discharge.  Neck: Normal range of motion. Neck supple. No thyromegaly present.  Cardiovascular: Normal rate, regular rhythm, normal heart sounds and intact distal pulses.   No murmur heard. Pulmonary/Chest: Effort normal and breath sounds normal. No respiratory distress. He has no wheezes.  Abdominal: Soft. Bowel sounds are normal. He exhibits no distension. There is no tenderness.  Musculoskeletal: Normal range of motion. He exhibits no edema (compression hose present) or tenderness.  Pt in electric wheelchair, morbid obese  Neurological: He is alert and oriented to person, place, and time.  Skin: Skin is warm and dry. No rash  noted. No erythema.  Psychiatric: He has a normal mood and affect. His behavior is normal. Judgment and thought content normal.  Vitals reviewed.    BP 133/82   Pulse (!) 56   Temp 98.4 F (36.9 C) (Oral)      Assessment & Plan:  1. Type 2 diabetes mellitus with hyperglycemia, with long-term current use of insulin (HCC) - Bayer DCA Hb A1c Waived - CMP14+EGFR - Microalbumin / creatinine urine ratio  2. Gastroesophageal reflux disease with esophagitis - CMP14+EGFR  3. Seizure disorder (HCC) - CMP14+EGFR  4. Benign prostatic hyperplasia with lower urinary tract symptoms, symptom details unspecified - CMP14+EGFR - PSA, total and free  5. Vitamin D deficiency - CMP14+EGFR - VITAMIN D 25 Hydroxy (Vit-D Deficiency, Fractures)  6. Pain medication agreement signed - CMP14+EGFR - fentaNYL (DURAGESIC - DOSED MCG/HR) 75 MCG/HR; Place 1 patch (75 mcg total) onto the skin every 3 (three) days.  Dispense: 10 patch; Refill: 0 - fentaNYL (DURAGESIC - DOSED MCG/HR) 75 MCG/HR; Place 1 patch (75 mcg total) onto the skin every 3 (three) days.  Dispense: 10 patch; Refill: 0 - fentaNYL (DURAGESIC) 75 MCG/HR; Place 1 patch (75 mcg total) onto the skin every 3 (three) days.  Dispense: 10 patch; Refill: 0 - HYDROcodone-acetaminophen (NORCO) 7.5-325 MG tablet; Take 1 tablet by mouth every 6 (six) hours as needed for moderate pain.  Dispense: 30 tablet; Refill: 0 - HYDROcodone-acetaminophen (NORCO) 7.5-325 MG tablet; Take 1 tablet by mouth every 6 (six) hours as needed for moderate pain.  Dispense: 30 tablet; Refill: 0 - HYDROcodone-acetaminophen (NORCO) 7.5-325 MG tablet; Take 1 tablet by mouth every 6 (six) hours as needed for moderate pain. May fill June 4th, 2018 or after  Dispense: 30 tablet; Refill: 0  7. Uncomplicated opioid dependence (HCC) - CMP14+EGFR - fentaNYL (DURAGESIC - DOSED MCG/HR) 75 MCG/HR; Place 1 patch (75 mcg total) onto the skin every 3 (three) days.  Dispense: 10 patch; Refill:  0 - fentaNYL (DURAGESIC - DOSED MCG/HR) 75 MCG/HR; Place 1 patch (75 mcg total) onto the skin every 3 (three) days.  Dispense: 10 patch; Refill: 0 - fentaNYL (DURAGESIC) 75 MCG/HR; Place 1 patch (75 mcg total) onto the skin every 3 (three) days.  Dispense: 10 patch; Refill: 0 - HYDROcodone-acetaminophen (NORCO) 7.5-325 MG tablet; Take 1 tablet by mouth every 6 (six) hours as needed for moderate pain.  Dispense: 30 tablet; Refill: 0 - HYDROcodone-acetaminophen (NORCO) 7.5-325 MG tablet; Take 1 tablet by mouth every 6 (six) hours as needed for moderate pain.  Dispense: 30 tablet; Refill: 0 - HYDROcodone-acetaminophen (NORCO) 7.5-325 MG tablet; Take 1 tablet by mouth every 6 (six) hours as needed for moderate pain. May fill June 4th, 2018 or after  Dispense: 30 tablet; Refill: 0  8. Obesity hypoventilation syndrome (HCC) - CMP14+EGFR  9. Morbid obesity ([place])  - CMP14+EGFR  10. Hyperlipidemia, unspecified hyperlipidemia type - CMP14+EGFR - Lipid panel  11. Anemia, unspecified type - CMP14+EGFR  12. GAD (generalized anxiety disorder) - CMP14+EGFR - diazepam (VALIUM) 5 MG tablet; Take 1 tablet (5 mg total) by mouth every 12 (twelve) hours as needed for anxiety.  Dispense: 60 tablet; Refill: 5  13. Chronic pain syndrome  - CMP14+EGFR - fentaNYL (DURAGESIC - DOSED MCG/HR) 75 MCG/HR; Place 1 patch (75 mcg total) onto the skin every 3 (three) days.  Dispense: 10 patch; Refill: 0 - fentaNYL (DURAGESIC - DOSED MCG/HR) 75 MCG/HR; Place 1 patch (75 mcg total) onto the skin every 3 (three) days.  Dispense: 10 patch; Refill:  0 - fentaNYL (DURAGESIC) 75 MCG/HR; Place 1 patch (75 mcg total) onto the skin every 3 (three) days.  Dispense: 10 patch; Refill: 0 - HYDROcodone-acetaminophen (NORCO) 7.5-325 MG tablet; Take 1 tablet by mouth every 6 (six) hours as needed for moderate pain.  Dispense: 30 tablet; Refill: 0 - HYDROcodone-acetaminophen (NORCO) 7.5-325 MG tablet; Take 1 tablet by mouth every 6 (six)  hours as needed for moderate pain.  Dispense: 30 tablet; Refill: 0 - HYDROcodone-acetaminophen (NORCO) 7.5-325 MG tablet; Take 1 tablet by mouth every 6 (six) hours as needed for moderate pain. May fill June 4th, 2018 or after  Dispense: 30 tablet; Refill: 0  14. Chronic bilateral low back pain with right-sided sciatica - CMP14+EGFR - fentaNYL (DURAGESIC - DOSED MCG/HR) 75 MCG/HR; Place 1 patch (75 mcg total) onto the skin every 3 (three) days.  Dispense: 10 patch; Refill: 0 - fentaNYL (DURAGESIC - DOSED MCG/HR) 75 MCG/HR; Place 1 patch (75 mcg total) onto the skin every 3 (three) days.  Dispense: 10 patch; Refill: 0 - fentaNYL (DURAGESIC) 75 MCG/HR; Place 1 patch (75 mcg total) onto the skin every 3 (three) days.  Dispense: 10 patch; Refill: 0 - HYDROcodone-acetaminophen (NORCO) 7.5-325 MG tablet; Take 1 tablet by mouth every 6 (six) hours as needed for moderate pain.  Dispense: 30 tablet; Refill: 0 - HYDROcodone-acetaminophen (NORCO) 7.5-325 MG tablet; Take 1 tablet by mouth every 6 (six) hours as needed for moderate pain.  Dispense: 30 tablet; Refill: 0 - HYDROcodone-acetaminophen (NORCO) 7.5-325 MG tablet; Take 1 tablet by mouth every 6 (six) hours as needed for moderate pain. May fill June 4th, 2018 or after  Dispense: 30 tablet; Refill: 0  15. Colon cancer screening - Fecal occult blood, imunochemical; Future   Continue all meds Labs pending Health Maintenance reviewed Diet and exercise encouraged RTO 3 months   Evelina Dun, FNP

## 2016-12-18 NOTE — Addendum Note (Signed)
Addended by: Evelina Dun A on: 12/18/2016 01:15 PM   Modules accepted: Orders

## 2016-12-19 ENCOUNTER — Other Ambulatory Visit: Payer: Self-pay | Admitting: Family

## 2016-12-19 LAB — LIPID PANEL
Chol/HDL Ratio: 2.8 ratio (ref 0.0–5.0)
Cholesterol, Total: 99 mg/dL — ABNORMAL LOW (ref 100–199)
HDL: 36 mg/dL — ABNORMAL LOW (ref >39)
LDL Calculated: 33 mg/dL (ref 0–99)
Triglycerides: 149 mg/dL (ref 0–149)
VLDL Cholesterol Cal: 30 mg/dL (ref 5–40)

## 2016-12-19 LAB — CMP14+EGFR
ALT: 21 IU/L (ref 0–44)
AST: 34 IU/L (ref 0–40)
Albumin/Globulin Ratio: 0.8 — ABNORMAL LOW (ref 1.2–2.2)
Albumin: 3.1 g/dL — ABNORMAL LOW (ref 3.5–5.5)
Alkaline Phosphatase: 99 IU/L (ref 39–117)
BUN/Creatinine Ratio: 12 (ref 9–20)
BUN: 21 mg/dL (ref 6–24)
Bilirubin Total: 0.4 mg/dL (ref 0.0–1.2)
CO2: 27 mmol/L (ref 20–29)
Calcium: 8.4 mg/dL — ABNORMAL LOW (ref 8.7–10.2)
Chloride: 99 mmol/L (ref 96–106)
Creatinine, Ser: 1.69 mg/dL — ABNORMAL HIGH (ref 0.76–1.27)
GFR calc Af Amer: 54 mL/min/{1.73_m2} — ABNORMAL LOW (ref >59)
GFR calc non Af Amer: 46 mL/min/{1.73_m2} — ABNORMAL LOW (ref >59)
Globulin, Total: 3.7 g/dL (ref 1.5–4.5)
Glucose: 199 mg/dL — ABNORMAL HIGH (ref 65–99)
Potassium: 4.7 mmol/L (ref 3.5–5.2)
Sodium: 137 mmol/L (ref 134–144)
Total Protein: 6.8 g/dL (ref 6.0–8.5)

## 2016-12-19 LAB — PSA, TOTAL AND FREE
PSA, Free: <0.01 ng/mL
Prostate Specific Ag, Serum: <0.1 ng/mL (ref 0.0–4.0)

## 2016-12-19 LAB — VITAMIN D 25 HYDROXY (VIT D DEFICIENCY, FRACTURES): Vit D, 25-Hydroxy: 29.1 ng/mL — ABNORMAL LOW (ref 30.0–100.0)

## 2016-12-21 ENCOUNTER — Encounter: Payer: Self-pay | Admitting: Family

## 2016-12-22 ENCOUNTER — Other Ambulatory Visit: Payer: Self-pay | Admitting: *Deleted

## 2016-12-22 MED ORDER — FLUTICASONE PROPIONATE 50 MCG/ACT NA SUSP: 1.0000 | Freq: Two times a day (BID) | NASAL | 3 refills | Status: DC | PRN

## 2016-12-31 ENCOUNTER — Encounter (HOSPITAL_BASED_OUTPATIENT_CLINIC_OR_DEPARTMENT_OTHER): Payer: Self-pay | Admitting: Emergency Medicine

## 2016-12-31 ENCOUNTER — Emergency Department (HOSPITAL_BASED_OUTPATIENT_CLINIC_OR_DEPARTMENT_OTHER)
Admission: EM | Admit: 2016-12-31 | Discharge: 2016-12-31 | Disposition: A | Discharge status: Home / Self Care | Payer: Medicare Other | Attending: Emergency Medicine | Admitting: Emergency Medicine

## 2016-12-31 ENCOUNTER — Emergency Department (HOSPITAL_BASED_OUTPATIENT_CLINIC_OR_DEPARTMENT_OTHER): Payer: Medicare Other

## 2016-12-31 DIAGNOSIS — Y999 Unspecified external cause status: Secondary | ICD-10-CM | POA: Insufficient documentation

## 2016-12-31 DIAGNOSIS — E1122 Type 2 diabetes mellitus with diabetic chronic kidney disease: Secondary | ICD-10-CM | POA: Diagnosis not present

## 2016-12-31 DIAGNOSIS — X58XXXA Exposure to other specified factors, initial encounter: Secondary | ICD-10-CM | POA: Insufficient documentation

## 2016-12-31 DIAGNOSIS — Z7901 Long term (current) use of anticoagulants: Secondary | ICD-10-CM | POA: Diagnosis not present

## 2016-12-31 DIAGNOSIS — Z7984 Long term (current) use of oral hypoglycemic drugs: Secondary | ICD-10-CM | POA: Insufficient documentation

## 2016-12-31 DIAGNOSIS — N189 Chronic kidney disease, unspecified: Secondary | ICD-10-CM | POA: Diagnosis not present

## 2016-12-31 DIAGNOSIS — Z91040 Latex allergy status: Secondary | ICD-10-CM | POA: Diagnosis not present

## 2016-12-31 DIAGNOSIS — S99921A Unspecified injury of right foot, initial encounter: Secondary | ICD-10-CM | POA: Diagnosis present

## 2016-12-31 DIAGNOSIS — E1140 Type 2 diabetes mellitus with diabetic neuropathy, unspecified: Secondary | ICD-10-CM | POA: Diagnosis not present

## 2016-12-31 DIAGNOSIS — Z794 Long term (current) use of insulin: Secondary | ICD-10-CM | POA: Diagnosis not present

## 2016-12-31 DIAGNOSIS — Y929 Unspecified place or not applicable: Secondary | ICD-10-CM | POA: Insufficient documentation

## 2016-12-31 DIAGNOSIS — S9031XA Contusion of right foot, initial encounter: Secondary | ICD-10-CM | POA: Diagnosis not present

## 2016-12-31 DIAGNOSIS — Y939 Activity, unspecified: Secondary | ICD-10-CM | POA: Diagnosis not present

## 2016-12-31 DIAGNOSIS — M19071 Primary osteoarthritis, right ankle and foot: Secondary | ICD-10-CM | POA: Diagnosis not present
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
## 2016-12-31 NOTE — ED Notes (Signed)
Pt verbalizes understanding of d/c instructions and denies any further needs at this time. 

## 2016-12-31 NOTE — Discharge Instructions (Signed)
Please read and follow all provided instructions.  Your diagnoses today include:  1. Contusion of right foot, initial encounter     Tests performed today include:  An x-ray of the affected area - does NOT show any broken bones  Vital signs. See below for your results today.   Medications prescribed:   None  Take any prescribed medications only as directed.  Home care instructions:   Follow any educational materials contained in this packet  Follow R.I.C.E. Protocol:  R - rest your injury   I  - use ice on injury without applying directly to skin  C - compress injury with bandage or splint  E - elevate the injury as much as possible  Follow-up instructions: Please follow-up with your primary care provider or the provided orthopedic physician (bone specialist) if you continue to have significant pain in 1 week. In this case you may have a more severe injury that requires further care.   Return instructions:   Please return if your toes or feet are numb or tingling, appear gray or blue, or you have severe pain (also elevate the leg and loosen splint or wrap if you were given one)  Please return to the Emergency Department if you experience worsening symptoms.   Please return if you have any other emergent concerns.  Additional Information:  Your vital signs today were: BP 127/70    Pulse 66    Temp 98.8 F (37.1 C) (Oral)    Resp 18    Wt (!) 181.4 kg (400 lb)    SpO2 93%    BMI 50.00 kg/m  If your blood pressure (BP) was elevated above 135/85 this visit, please have this repeated by your doctor within one month. --------------
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
## 2016-12-31 NOTE — ED Notes (Signed)
Patient given ice for his right foot. Patient states that he does not want it, family took it and applied it.

## 2016-12-31 NOTE — ED Notes (Signed)
Patient states that his leg is "normal color and size" - he is unable to wear his compression hose because of the swelling to his foot.

## 2017-01-10 DIAGNOSIS — J449 Chronic obstructive pulmonary disease, unspecified: Secondary | ICD-10-CM | POA: Diagnosis not present

## 2017-01-16 ENCOUNTER — Other Ambulatory Visit: Payer: Self-pay | Admitting: Family

## 2017-01-28 ENCOUNTER — Ambulatory Visit (INDEPENDENT_AMBULATORY_CARE_PROVIDER_SITE_OTHER): Payer: Medicare Other | Admitting: Pharmacist

## 2017-01-28 DIAGNOSIS — M25512 Pain in left shoulder: Secondary | ICD-10-CM

## 2017-01-28 DIAGNOSIS — M25511 Pain in right shoulder: Secondary | ICD-10-CM

## 2017-01-28 DIAGNOSIS — I82409 Acute embolism and thrombosis of unspecified deep veins of unspecified lower extremity: Secondary | ICD-10-CM

## 2017-01-28 DIAGNOSIS — J810 Acute pulmonary edema: Secondary | ICD-10-CM | POA: Diagnosis not present

## 2017-01-28 DIAGNOSIS — G8929 Other chronic pain: Secondary | ICD-10-CM

## 2017-01-28 LAB — COAGUCHEK XS/INR WAIVED
INR: 2.5 — ABNORMAL HIGH (ref 0.9–1.1)
Prothrombin Time: 30 s

## 2017-01-28 NOTE — Patient Instructions (Signed)
Anticoagulation Warfarin Dose Instructions as of 01/28/2017      [name] Tue Wed Thu Fri Sat   New Dose 5 mg 5 mg 7.5 mg 5 mg 5 mg 5 mg 7.5 mg    Description   Continue to take warfarin 5mg  - take 1 and 1/2  tablets on Tuesday and Saturday, and 1 tablet the rest of the week.  INR today was 2.5

## 2017-02-03 DIAGNOSIS — M25511 Pain in right shoulder: Secondary | ICD-10-CM | POA: Diagnosis not present

## 2017-02-03 DIAGNOSIS — M19012 Primary osteoarthritis, left shoulder: Secondary | ICD-10-CM | POA: Diagnosis not present

## 2017-02-10 DIAGNOSIS — J449 Chronic obstructive pulmonary disease, unspecified: Secondary | ICD-10-CM | POA: Diagnosis not present

## 2017-02-17 ENCOUNTER — Other Ambulatory Visit: Payer: Self-pay | Admitting: Family Medicine

## 2017-02-17 ENCOUNTER — Other Ambulatory Visit: Payer: Self-pay | Admitting: Family

## 2017-02-17 DIAGNOSIS — F32A Depression, unspecified: Secondary | ICD-10-CM

## 2017-02-17 DIAGNOSIS — Z794 Long term (current) use of insulin: Principal | ICD-10-CM

## 2017-02-17 DIAGNOSIS — F329 Major depressive disorder, single episode, unspecified: Secondary | ICD-10-CM

## 2017-02-17 DIAGNOSIS — E1165 Type 2 diabetes mellitus with hyperglycemia: Secondary | ICD-10-CM

## 2017-02-19 NOTE — Telephone Encounter (Signed)
Phoned in.

## 2017-02-24 ENCOUNTER — Encounter: Payer: Self-pay | Admitting: Family Medicine

## 2017-02-24 ENCOUNTER — Ambulatory Visit (INDEPENDENT_AMBULATORY_CARE_PROVIDER_SITE_OTHER): Payer: Medicare Other | Admitting: Family Medicine

## 2017-02-24 ENCOUNTER — Ambulatory Visit: Payer: Medicare Other | Admitting: Family Medicine

## 2017-02-24 VITALS — BP 118/76 | HR 64 | Temp 97.5°F

## 2017-02-24 DIAGNOSIS — N453 Epididymo-orchitis: Secondary | ICD-10-CM

## 2017-02-24 MED ORDER — DOXYCYCLINE HYCLATE 100 MG PO TABS: 100.0000 mg | ORAL_TABLET | Freq: Two times a day (BID) | ORAL | 0 refills | Status: DC

## 2017-02-24 NOTE — Progress Notes (Signed)
Chief Complaint  Patient presents with  . swollen testicles    right worse    HPI  Patient presents today for Painful testicles. The right seems very swollen. Onset spent about a week ago. It slowly been getting worse since that time. He says the testicles feel squishy. He does not wear underwear. He is wheelchair-bound and can only stand briefly. He is 6 reactive with his wife who tends today's visit and has noted that there is significant swelling. Patient denies fever chills or sweats. Pain does not radiate. There has been no injury.  PMH: Smoking status noted ROS: Per HPI  Objective: BP 118/76   Pulse 64   Temp (!) 97.5 F (36.4 C) (Oral)  Gen: NAD, alert, cooperative with exam HEENT: NCAT, EOMI, PERRL CV: RRR, good S1/S2, no murmur Resp: CTABL, no wheezes, non-labored Abd: SNTND, both testicles are tender to palpation without masses however there is some edema. The epididymides are swollen and tender as well.  Ext: No edema, warm Neuro: Alert and oriented, No gross deficits  Assessment and plan:  1. Epididymoorchitis     Meds ordered this encounter  Medications  . doxycycline (VIBRA-TABS) 100 MG tablet    Sig: Take 1 tablet (100 mg total) by mouth 2 (two) times daily. 1 po bid    Dispense:  20 tablet    Refill:  0    Follow up Soon for routine INR check. Claretta Fraise, MD

## 2017-02-25 ENCOUNTER — Other Ambulatory Visit: Payer: Self-pay | Admitting: Family

## 2017-03-03 ENCOUNTER — Telehealth: Payer: Self-pay | Admitting: Family

## 2017-03-03 ENCOUNTER — Encounter: Payer: Medicare Other | Admitting: *Deleted

## 2017-03-04 ENCOUNTER — Encounter: Payer: Self-pay | Admitting: Family

## 2017-03-05 NOTE — Telephone Encounter (Signed)
Patient has been taking 190 u daily as prescribed but is completely out.  Pharmacy says not due for refill for another month and a half.  Patient questions that pharmacy shorted him on medication.  Will need a new script with change?  ,to get insurance to cover for refill.  Please advise.

## 2017-03-05 NOTE — Telephone Encounter (Signed)
Call pharmacy and see how it is given, and how it was given to him.  I have read that it comes in a solostar pen (1.5 ml per pen, which equals 450 units of insulin.  This med has 300 U per ml.)  And boxes of insulin are usually 3 or 5 pens.  With him taking 190 U per day, that's 5700 units per months, and needs 19 ml per month.  The prescription sent by [facility] has 54 ml on the quantity line so I am confused.

## 2017-03-05 NOTE — Telephone Encounter (Signed)
Please address  Sees Alyse Low

## 2017-03-05 NOTE — Telephone Encounter (Signed)
Can we get clearer information about this so I can refill?

## 2017-03-05 NOTE — Telephone Encounter (Signed)
Spoke with pharmacist. Since patient is roughly half way through the 3 month period he believes they may have shorted him 6 boxes. There's no way to verify that but the pharmacist was gracious enough to give Antaeus those 6 boxes. I have notified his wife and they will pick them up.

## 2017-03-08 ENCOUNTER — Other Ambulatory Visit: Payer: Self-pay | Admitting: Family

## 2017-03-13 DIAGNOSIS — J449 Chronic obstructive pulmonary disease, unspecified: Secondary | ICD-10-CM | POA: Diagnosis not present

## 2017-03-15 ENCOUNTER — Other Ambulatory Visit: Payer: Self-pay | Admitting: *Deleted

## 2017-03-15 MED ORDER — FLUTICASONE PROPIONATE 50 MCG/ACT NA SUSP: 1.0000 | Freq: Two times a day (BID) | NASAL | 5 refills | Status: DC | PRN

## 2017-03-17 DIAGNOSIS — M19011 Primary osteoarthritis, right shoulder: Secondary | ICD-10-CM | POA: Diagnosis not present

## 2017-03-17 DIAGNOSIS — M19012 Primary osteoarthritis, left shoulder: Secondary | ICD-10-CM | POA: Diagnosis not present

## 2017-03-19 ENCOUNTER — Other Ambulatory Visit: Payer: Self-pay | Admitting: Family Medicine

## 2017-03-19 ENCOUNTER — Other Ambulatory Visit: Payer: Self-pay | Admitting: Family

## 2017-03-19 DIAGNOSIS — F411 Generalized anxiety disorder: Secondary | ICD-10-CM

## 2017-03-22 ENCOUNTER — Other Ambulatory Visit: Payer: Self-pay | Admitting: *Deleted

## 2017-03-22 MED ORDER — INSULIN LISPRO 100 UNIT/ML (KWIKPEN): PEN_INJECTOR | SUBCUTANEOUS | 2 refills | Status: DC

## 2017-03-25 ENCOUNTER — Telehealth: Payer: Self-pay | Admitting: Neurology

## 2017-03-25 NOTE — Telephone Encounter (Signed)
Pt last seen 05/2016.  Ok to schedule for sphenocath?  Next appt 05/26/2017.

## 2017-03-25 NOTE — Telephone Encounter (Signed)
[name] wife called he started having increase in frequency migraines over the past 3 weeks. Pt is wanting to get a sphenocath. An appt was scheduled for 11/28 but she is wanting him seen sooner.

## 2017-03-26 NOTE — Telephone Encounter (Signed)
Yes please schedule this next week thanks

## 2017-03-29 NOTE — Telephone Encounter (Signed)
I called pt to discuss a sooner appt with Dr. Jaynee Eagles for today at 8:30am. Spoke to pt's wife, [name], per [facility]. Per pt's wife, pt is sleeping right now, and pt's wife cannot take him to an appt today and asked for a call back when something else becomes available.

## 2017-03-29 NOTE — Telephone Encounter (Signed)
How about Wed at 945 or 11:15?

## 2017-03-30 NOTE — Telephone Encounter (Signed)
Rn call patients wife about being seeing on 03/31/2017 for 0915am  or 1115. Pts wife will bring him after she takes her test. Pt will check in at 1100am.

## 2017-03-30 NOTE — Telephone Encounter (Signed)
Yes that is totally fine thanks

## 2017-03-31 ENCOUNTER — Ambulatory Visit (INDEPENDENT_AMBULATORY_CARE_PROVIDER_SITE_OTHER): Payer: Medicare Other | Admitting: Neurology

## 2017-03-31 ENCOUNTER — Encounter: Payer: Self-pay | Admitting: Neurology

## 2017-03-31 VITALS — BP 128/79 | HR 62 | Ht 75.0 in | Wt >= 6400 oz

## 2017-03-31 DIAGNOSIS — G43711 Chronic migraine without aura, intractable, with status migrainosus: Secondary | ICD-10-CM

## 2017-03-31 DIAGNOSIS — G43019 Migraine without aura, intractable, without status migrainosus: Secondary | ICD-10-CM | POA: Diagnosis not present

## 2017-03-31 NOTE — Progress Notes (Signed)
Xylocaine 2% used for sphenocath injection. 400mg /35mL multi dose vial  89mL syringex2 with 3 mL xylocaine 2% drawn up, however, after pt's visit, one 55mL syringe of 3 mL xylocaine was wasted due to not being used.  CXW:[id number] EXP: 07/2020 [id number]
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
## 2017-04-02 ENCOUNTER — Ambulatory Visit (INDEPENDENT_AMBULATORY_CARE_PROVIDER_SITE_OTHER): Payer: Medicare Other | Admitting: Family

## 2017-04-02 ENCOUNTER — Encounter: Payer: Self-pay | Admitting: Family

## 2017-04-02 ENCOUNTER — Telehealth: Payer: Self-pay | Admitting: Neurology

## 2017-04-02 VITALS — BP 124/85 | HR 61 | Temp 98.9°F | Ht 72.0 in

## 2017-04-02 DIAGNOSIS — K2100 Gastro-esophageal reflux disease with esophagitis, without bleeding: Secondary | ICD-10-CM

## 2017-04-02 DIAGNOSIS — M5441 Lumbago with sciatica, right side: Secondary | ICD-10-CM

## 2017-04-02 DIAGNOSIS — Z86711 Personal history of pulmonary embolism: Secondary | ICD-10-CM | POA: Diagnosis not present

## 2017-04-02 DIAGNOSIS — G43711 Chronic migraine without aura, intractable, with status migrainosus: Secondary | ICD-10-CM | POA: Insufficient documentation

## 2017-04-02 DIAGNOSIS — E662 Morbid (severe) obesity with alveolar hypoventilation: Secondary | ICD-10-CM

## 2017-04-02 DIAGNOSIS — N50819 Testicular pain, unspecified: Secondary | ICD-10-CM

## 2017-04-02 DIAGNOSIS — F1120 Opioid dependence, uncomplicated: Secondary | ICD-10-CM | POA: Diagnosis not present

## 2017-04-02 DIAGNOSIS — E1165 Type 2 diabetes mellitus with hyperglycemia: Secondary | ICD-10-CM

## 2017-04-02 DIAGNOSIS — N5089 Other specified disorders of the male genital organs: Secondary | ICD-10-CM

## 2017-04-02 DIAGNOSIS — G894 Chronic pain syndrome: Secondary | ICD-10-CM

## 2017-04-02 DIAGNOSIS — Z0289 Encounter for other administrative examinations: Secondary | ICD-10-CM

## 2017-04-02 DIAGNOSIS — Z794 Long term (current) use of insulin: Secondary | ICD-10-CM | POA: Diagnosis not present

## 2017-04-02 DIAGNOSIS — G40909 Epilepsy, unspecified, not intractable, without status epilepticus: Secondary | ICD-10-CM | POA: Diagnosis not present

## 2017-04-02 DIAGNOSIS — E785 Hyperlipidemia, unspecified: Secondary | ICD-10-CM | POA: Diagnosis not present

## 2017-04-02 DIAGNOSIS — F411 Generalized anxiety disorder: Secondary | ICD-10-CM | POA: Diagnosis not present

## 2017-04-02 DIAGNOSIS — G8929 Other chronic pain: Secondary | ICD-10-CM

## 2017-04-02 LAB — COAGUCHEK XS/INR WAIVED
INR: 3 — ABNORMAL HIGH (ref 0.9–1.1)
Prothrombin Time: 36.2 s

## 2017-04-02 LAB — BAYER DCA HB A1C WAIVED: HB A1C (BAYER DCA - WAIVED): 7.4 % — ABNORMAL HIGH (ref <7.0)

## 2017-04-02 MED ORDER — HYDROCODONE-ACETAMINOPHEN 7.5-325 MG PO TABS: 1.0000 | ORAL_TABLET | Freq: Four times a day (QID) | ORAL | 0 refills | Status: DC | PRN

## 2017-04-02 MED ORDER — FENTANYL 75 MCG/HR TD PT72: 75.0000 ug | MEDICATED_PATCH | TRANSDERMAL | 0 refills | Status: DC

## 2017-04-02 NOTE — Telephone Encounter (Signed)
Order Aimovig please

## 2017-04-02 NOTE — Patient Instructions (Signed)

## 2017-04-02 NOTE — Progress Notes (Signed)
Subjective:    Patient ID: [name], male    DOB: 1965-11-30, 51 y.o.   MRN: [id number]  Pt presents to the office today for chronic follow up. Pt is followed by neurologists every 6 months for migraines and seizures. PT is followed by Ortho every 6 weeks for osteoarthritis.    Pt complaining of enlarged and painful testes. Pt was seen on 02/24/17 and given doxycycline that did not help.  Diabetes  He presents for his follow-up diabetic visit. He has type 2 diabetes mellitus. His disease course has been fluctuating. Hypoglycemia symptoms include nervousness/anxiousness. Associated symptoms include blurred vision and visual change. Pertinent negatives for diabetes include no foot paresthesias. There are no hypoglycemic complications. Symptoms are stable. Diabetic complications include heart disease. Risk factors for coronary artery disease include diabetes mellitus, dyslipidemia, male sex, obesity, hypertension and sedentary lifestyle. He is following a generally unhealthy diet. His breakfast blood glucose range is generally 130-140 mg/dl. Eye exam is not current.  Gastroesophageal Reflux  He reports no belching, no heartburn or no water brash. This is a chronic problem. The current episode started more than 1 year ago. The problem occurs occasionally. The problem has been waxing and waning. He has tried a PPI for the symptoms. The treatment provided moderate relief.  Arthritis  Presents for follow-up visit. He complains of pain and stiffness. The symptoms have been stable. Affected locations include the right shoulder and left shoulder. His pain is at a severity of 4/10.  Anemia  Presents for follow-up visit. Symptoms include malaise/fatigue.  Anxiety  Presents for follow-up visit. Symptoms include decreased concentration, excessive worry, irritability, nervous/anxious behavior and panic. Symptoms occur most days.   His past medical history is significant for anemia.  Hyperlipidemia    This is a chronic problem. The current episode started more than 1 year ago. The problem is controlled. Recent lipid tests were reviewed and are normal. Exacerbating diseases include obesity. Current antihyperlipidemic treatment includes statins. The current treatment provides moderate improvement of lipids. Risk factors for coronary artery disease include diabetes mellitus, dyslipidemia, obesity, male sex and a sedentary lifestyle.  Benign Prostatic Hypertrophy  This is a chronic problem. The current episode started more than 1 year ago. The problem has been waxing and waning since onset. Irritative symptoms include nocturia. He is sexually active. Past treatments include tamsulosin. The treatment provided mild relief.      Review of Systems  Constitutional: Positive for irritability and malaise/fatigue.  Eyes: Positive for blurred vision.  Gastrointestinal: Negative for heartburn.  Genitourinary: Positive for nocturia.  Musculoskeletal: Positive for arthritis and stiffness.  Psychiatric/Behavioral: Positive for decreased concentration. The patient is nervous/anxious.   All other systems reviewed and are negative.      Objective:   Physical Exam  Constitutional: He is oriented to person, place, and time. He appears well-developed and well-nourished. No distress.  Morbid obese  HENT:  Head: Normocephalic.  Right Ear: External ear normal.  Left Ear: External ear normal.  Mouth/Throat: Oropharynx is clear and moist.  Eyes: Pupils are equal, round, and reactive to light. Right eye exhibits no discharge. Left eye exhibits no discharge.  Neck: Normal range of motion. Neck supple. No thyromegaly present.  Cardiovascular: Normal rate, regular rhythm, normal heart sounds and intact distal pulses.   No murmur heard. Pulmonary/Chest: Effort normal and breath sounds normal. No respiratory distress. He has no wheezes.  Abdominal: Soft. Bowel sounds are normal. He exhibits no distension. There  is no tenderness.  Musculoskeletal: Normal range of motion. He exhibits no edema or tenderness.  In wheelchair, compression hose  Neurological: He is alert and oriented to person, place, and time. He has normal reflexes. No cranial nerve deficit.  Skin: Skin is warm and dry. No rash noted. No erythema.  Psychiatric: He has a normal mood and affect. His behavior is normal. Judgment and thought content normal.  Vitals reviewed.    BP 124/85   Pulse 61   Temp 98.9 F (37.2 C) (Oral)   Ht 6' (1.829 m)      Assessment & Plan:  1. Seizure disorder ([place]) - CMP14+EGFR  2. Pain medication agreement signed - fentaNYL (DURAGESIC - DOSED MCG/HR) 75 MCG/HR; Place 1 patch (75 mcg total) onto the skin every 3 (three) days.  Dispense: 10 patch; Refill: 0 - fentaNYL (DURAGESIC - DOSED MCG/HR) 75 MCG/HR; Place 1 patch (75 mcg total) onto the skin every 3 (three) days.  Dispense: 10 patch; Refill: 0 - fentaNYL (DURAGESIC) 75 MCG/HR; Place 1 patch (75 mcg total) onto the skin every 3 (three) days.  Dispense: 10 patch; Refill: 0 - HYDROcodone-acetaminophen (NORCO) 7.5-325 MG tablet; Take 1 tablet by mouth every 6 (six) hours as needed for moderate pain.  Dispense: 30 tablet; Refill: 0 - HYDROcodone-acetaminophen (NORCO) 7.5-325 MG tablet; Take 1 tablet by mouth every 6 (six) hours as needed for moderate pain.  Dispense: 30 tablet; Refill: 0 - HYDROcodone-acetaminophen (NORCO) 7.5-325 MG tablet; Take 1 tablet by mouth every 6 (six) hours as needed for moderate pain. May fill June 4th, 2018 or after  Dispense: 30 tablet; Refill: 0 - CMP14+EGFR  3. Uncomplicated opioid dependence (HCC) - fentaNYL (DURAGESIC - DOSED MCG/HR) 75 MCG/HR; Place 1 patch (75 mcg total) onto the skin every 3 (three) days.  Dispense: 10 patch; Refill: 0 - fentaNYL (DURAGESIC - DOSED MCG/HR) 75 MCG/HR; Place 1 patch (75 mcg total) onto the skin every 3 (three) days.  Dispense: 10 patch; Refill: 0 - fentaNYL (DURAGESIC) 75 MCG/HR;  Place 1 patch (75 mcg total) onto the skin every 3 (three) days.  Dispense: 10 patch; Refill: 0 - HYDROcodone-acetaminophen (NORCO) 7.5-325 MG tablet; Take 1 tablet by mouth every 6 (six) hours as needed for moderate pain.  Dispense: 30 tablet; Refill: 0 - HYDROcodone-acetaminophen (NORCO) 7.5-325 MG tablet; Take 1 tablet by mouth every 6 (six) hours as needed for moderate pain.  Dispense: 30 tablet; Refill: 0 - HYDROcodone-acetaminophen (NORCO) 7.5-325 MG tablet; Take 1 tablet by mouth every 6 (six) hours as needed for moderate pain. May fill June 4th, 2018 or after  Dispense: 30 tablet; Refill: 0 - CMP14+EGFR  4. Obesity hypoventilation syndrome (HCC) - CMP14+EGFR  5. Morbid obesity ([place]) - CMP14+EGFR  6. Type 2 diabetes mellitus with hyperglycemia, with long-term current use of insulin (HCC) - Bayer DCA Hb A1c Waived - CMP14+EGFR - Microalbumin / creatinine urine ratio  7. GAD (generalized anxiety disorder) - CMP14+EGFR  8. Gastroesophageal reflux disease with esophagitis - CMP14+EGFR  9. Hyperlipidemia, unspecified hyperlipidemia type - CMP14+EGFR - Lipid panel  10. Chronic pain syndrome - fentaNYL (DURAGESIC - DOSED MCG/HR) 75 MCG/HR; Place 1 patch (75 mcg total) onto the skin every 3 (three) days.  Dispense: 10 patch; Refill: 0 - fentaNYL (DURAGESIC - DOSED MCG/HR) 75 MCG/HR; Place 1 patch (75 mcg total) onto the skin every 3 (three) days.  Dispense: 10 patch; Refill: 0 - fentaNYL (DURAGESIC) 75 MCG/HR; Place 1 patch (75 mcg total) onto the skin every 3 (three) days.  Dispense: 10 patch; Refill: 0 - HYDROcodone-acetaminophen (NORCO) 7.5-325 MG tablet; Take 1 tablet by mouth every 6 (six) hours as needed for moderate pain.  Dispense: 30 tablet; Refill: 0 - HYDROcodone-acetaminophen (NORCO) 7.5-325 MG tablet; Take 1 tablet by mouth every 6 (six) hours as needed for moderate pain.  Dispense: 30 tablet; Refill: 0 - HYDROcodone-acetaminophen (NORCO) 7.5-325 MG tablet; Take 1 tablet  by mouth every 6 (six) hours as needed for moderate pain. May fill June 4th, 2018 or after  Dispense: 30 tablet; Refill: 0 - CMP14+EGFR  11. Chronic bilateral low back pain with right-sided sciatica - fentaNYL (DURAGESIC - DOSED MCG/HR) 75 MCG/HR; Place 1 patch (75 mcg total) onto the skin every 3 (three) days.  Dispense: 10 patch; Refill: 0 - fentaNYL (DURAGESIC - DOSED MCG/HR) 75 MCG/HR; Place 1 patch (75 mcg total) onto the skin every 3 (three) days.  Dispense: 10 patch; Refill: 0 - fentaNYL (DURAGESIC) 75 MCG/HR; Place 1 patch (75 mcg total) onto the skin every 3 (three) days.  Dispense: 10 patch; Refill: 0 - HYDROcodone-acetaminophen (NORCO) 7.5-325 MG tablet; Take 1 tablet by mouth every 6 (six) hours as needed for moderate pain.  Dispense: 30 tablet; Refill: 0 - HYDROcodone-acetaminophen (NORCO) 7.5-325 MG tablet; Take 1 tablet by mouth every 6 (six) hours as needed for moderate pain.  Dispense: 30 tablet; Refill: 0 - HYDROcodone-acetaminophen (NORCO) 7.5-325 MG tablet; Take 1 tablet by mouth every 6 (six) hours as needed for moderate pain. May fill June 4th, 2018 or after  Dispense: 30 tablet; Refill: 0 - CMP14+EGFR  12. Testicular pain - Ambulatory referral to Urology - US SCROTUM; Future - [place] Art/Ven Flow Abd Pelv Doppler; Future  13. Enlarged testicle - Ambulatory referral to Urology - US SCROTUM; Future - [place] Art/Ven Flow Abd Pelv Doppler; Future  Pt reviewed in  controlled database- Pt has only received controlled medication from our office. [name] Valium stopped today.   Continue all meds Labs pending Health Maintenance reviewed Diet and exercise encouraged RTO 3 months   Evelina Dun, FNP

## 2017-04-02 NOTE — Addendum Note (Signed)
Addended by: Evelina Dun A on: 04/02/2017 11:47 AM   Modules accepted: Orders

## 2017-04-03 LAB — LIPID PANEL
Chol/HDL Ratio: 2.6 ratio (ref 0.0–5.0)
Cholesterol, Total: 116 mg/dL (ref 100–199)
HDL: 44 mg/dL (ref >39)
LDL Calculated: 43 mg/dL (ref 0–99)
Triglycerides: 145 mg/dL (ref 0–149)
VLDL Cholesterol Cal: 29 mg/dL (ref 5–40)

## 2017-04-03 LAB — CMP14+EGFR
ALT: 22 IU/L (ref 0–44)
AST: 30 IU/L (ref 0–40)
Albumin/Globulin Ratio: 1 — ABNORMAL LOW (ref 1.2–2.2)
Albumin: 3.5 g/dL (ref 3.5–5.5)
Alkaline Phosphatase: 111 IU/L (ref 39–117)
BUN/Creatinine Ratio: 16 (ref 9–20)
BUN: 27 mg/dL — ABNORMAL HIGH (ref 6–24)
Bilirubin Total: 0.5 mg/dL (ref 0.0–1.2)
CO2: 27 mmol/L (ref 20–29)
Calcium: 8.6 mg/dL — ABNORMAL LOW (ref 8.7–10.2)
Chloride: 97 mmol/L (ref 96–106)
Creatinine, Ser: 1.66 mg/dL — ABNORMAL HIGH (ref 0.76–1.27)
GFR calc Af Amer: 55 mL/min/{1.73_m2} — ABNORMAL LOW (ref >59)
GFR calc non Af Amer: 47 mL/min/{1.73_m2} — ABNORMAL LOW (ref >59)
Globulin, Total: 3.6 g/dL (ref 1.5–4.5)
Glucose: 139 mg/dL — ABNORMAL HIGH (ref 65–99)
Potassium: 5.5 mmol/L — ABNORMAL HIGH (ref 3.5–5.2)
Sodium: 137 mmol/L (ref 134–144)
Total Protein: 7.1 g/dL (ref 6.0–8.5)

## 2017-04-05 ENCOUNTER — Other Ambulatory Visit: Payer: Self-pay | Admitting: Family

## 2017-04-05 MED ORDER — ERENUMAB-AOOE 70 MG/ML ~~LOC~~ SOAJ: 70.0000 mg | SUBCUTANEOUS | 11 refills | Status: DC

## 2017-04-05 NOTE — Telephone Encounter (Signed)
Order placed

## 2017-04-05 NOTE — Telephone Encounter (Signed)
Please order aimovig thanks

## 2017-04-05 NOTE — Addendum Note (Signed)
Addended by: Lester  A on: 04/05/2017 09:59 AM   Modules accepted: Orders

## 2017-04-05 NOTE — Telephone Encounter (Signed)
LVM for patient to call us back and let us know which pharmacy he would like [place] to fax the Aimovig prescription to

## 2017-04-05 NOTE — Telephone Encounter (Signed)
Order for aimovig was faxed to [facility] in [facility]. If pt calls back, please advise him of this and make sure that this is the correct pharmacy.

## 2017-04-05 NOTE — Telephone Encounter (Signed)
I completed pa for aimovig. Sent to [facility]. Should have a determination in 3-5 business days.

## 2017-04-06 ENCOUNTER — Ambulatory Visit: Payer: Medicare Other

## 2017-04-06 NOTE — Telephone Encounter (Signed)
[name] wife returned RN's call, [facility].

## 2017-04-07 NOTE — Telephone Encounter (Signed)
Received this notice from [employer]:   Request Reference Number: [id number]. AIMOVIG INJ 70MG /ML is denied for not meeting the prior authorization requirement(s). For further questions, call [phone]. Appeals are not supported through [place]. Please refer to the fax case notice for appeals information and instructions.  Do you want [place] to appeal or try a different medication such as Ajovy?

## 2017-04-07 NOTE — Telephone Encounter (Signed)
Yes, do appeal for this please thanks!

## 2017-04-12 DIAGNOSIS — J449 Chronic obstructive pulmonary disease, unspecified: Secondary | ICD-10-CM | POA: Diagnosis not present

## 2017-04-13 ENCOUNTER — Other Ambulatory Visit: Payer: Self-pay | Admitting: Family

## 2017-04-13 NOTE — Telephone Encounter (Signed)
Anna/[employer] [phone] calling to confirm if appeal information was rec'd. Please call

## 2017-04-14 NOTE — Telephone Encounter (Signed)
[name],  Can you call this pt and let him know that aimovig was denied. If he has signed up for the access card/bridge program, aimovig is supposed to provide him 6 months of free aimovig. Pt will need an appt within 5 months with Dr. Jaynee Eagles if he wants to continue aimovig.

## 2017-04-15 ENCOUNTER — Ambulatory Visit: Payer: Self-pay | Admitting: Neurology

## 2017-04-15 NOTE — Telephone Encounter (Signed)
Anna/[employer] [phone] called appeal forms were faxed on Monday 04/12/17. Have you rec'd them? She is wanting to know the status. Please call

## 2017-04-21 NOTE — Telephone Encounter (Signed)
I spoke with patients wife and made her aware that the aimovig was denied by the insurance and asked if he would like to try for the free samples through the [place] or switch to [place]. She discussed with patient and he would like to try for the free samples. I provided them with the access card website and phone number and they are aware to call if they have any problems.

## 2017-04-28 DIAGNOSIS — M19011 Primary osteoarthritis, right shoulder: Secondary | ICD-10-CM | POA: Diagnosis not present

## 2017-05-05 ENCOUNTER — Ambulatory Visit
Admission: RE | Admit: 2017-05-05 | Discharge: 2017-05-05 | Disposition: A | Discharge status: Home / Self Care | Payer: Medicare Other | Source: Ambulatory Visit | Attending: Family | Admitting: Family

## 2017-05-05 ENCOUNTER — Other Ambulatory Visit: Payer: Self-pay | Admitting: Family

## 2017-05-05 DIAGNOSIS — N50819 Testicular pain, unspecified: Secondary | ICD-10-CM

## 2017-05-05 DIAGNOSIS — N5089 Other specified disorders of the male genital organs: Secondary | ICD-10-CM

## 2017-05-06 ENCOUNTER — Other Ambulatory Visit: Payer: Self-pay | Admitting: Family Medicine

## 2017-05-06 ENCOUNTER — Other Ambulatory Visit: Payer: Self-pay | Admitting: Family

## 2017-05-06 DIAGNOSIS — Z794 Long term (current) use of insulin: Principal | ICD-10-CM

## 2017-05-06 DIAGNOSIS — E1165 Type 2 diabetes mellitus with hyperglycemia: Secondary | ICD-10-CM

## 2017-05-08 ENCOUNTER — Other Ambulatory Visit: Payer: Self-pay | Admitting: Family

## 2017-05-11 ENCOUNTER — Ambulatory Visit: Payer: Medicare Other | Admitting: Neurology

## 2017-05-12 ENCOUNTER — Ambulatory Visit (INDEPENDENT_AMBULATORY_CARE_PROVIDER_SITE_OTHER): Payer: Medicare Other | Admitting: *Deleted

## 2017-05-12 DIAGNOSIS — Z794 Long term (current) use of insulin: Secondary | ICD-10-CM

## 2017-05-12 DIAGNOSIS — J810 Acute pulmonary edema: Secondary | ICD-10-CM | POA: Diagnosis not present

## 2017-05-12 DIAGNOSIS — I82409 Acute embolism and thrombosis of unspecified deep veins of unspecified lower extremity: Secondary | ICD-10-CM

## 2017-05-12 DIAGNOSIS — E1165 Type 2 diabetes mellitus with hyperglycemia: Secondary | ICD-10-CM

## 2017-05-12 LAB — COAGUCHEK XS/INR WAIVED
INR: 3.2 — ABNORMAL HIGH (ref 0.9–1.1)
Prothrombin Time: 38 s

## 2017-05-12 NOTE — Progress Notes (Signed)
Subjective:     Indication: DVT Bleeding signs/symptoms: None Thromboembolic signs/symptoms: None  Missed Coumadin doses: None Medication changes: no Dietary changes: no Bacterial/viral infection: no Other concerns: no  The following portions of the patient's history were reviewed and updated as appropriate: allergies and current medications.  Review of Systems Pertinent items are noted in HPI.   Objective:    INR Today: 3.2   Current dose: 7.5mg  on Tues and Sat and 5mg  all other days    Assessment:    Supratherapeutic INR for goal of 2-3   Plan:    1. New dose: Hold today's dose and restart normal schedule tomorrow   2. Next INR: 2 weeks  Discussed with Evelina Dun, FNP  Chong Sicilian, RN

## 2017-05-13 DIAGNOSIS — J449 Chronic obstructive pulmonary disease, unspecified: Secondary | ICD-10-CM | POA: Diagnosis not present

## 2017-05-13 LAB — MICROALBUMIN / CREATININE URINE RATIO
Creatinine, Urine: 34 mg/dL
Microalb/Creat Ratio: 204.7 mg/g creat — ABNORMAL HIGH (ref 0.0–30.0)
Microalbumin, Urine: 69.6 ug/mL

## 2017-05-18 ENCOUNTER — Other Ambulatory Visit: Payer: Self-pay | Admitting: Family

## 2017-05-18 DIAGNOSIS — F32A Depression, unspecified: Secondary | ICD-10-CM

## 2017-05-18 DIAGNOSIS — F329 Major depressive disorder, single episode, unspecified: Secondary | ICD-10-CM

## 2017-05-19 NOTE — Telephone Encounter (Signed)
Last filled 02/19/17

## 2017-05-19 NOTE — Telephone Encounter (Signed)
Script for Lyrica called to [employer].

## 2017-05-24 ENCOUNTER — Ambulatory Visit (INDEPENDENT_AMBULATORY_CARE_PROVIDER_SITE_OTHER): Payer: Medicare Other | Admitting: Family

## 2017-05-24 ENCOUNTER — Encounter: Payer: Self-pay | Admitting: Family

## 2017-05-24 VITALS — BP 123/75 | HR 64 | Temp 97.4°F

## 2017-05-24 DIAGNOSIS — Z86711 Personal history of pulmonary embolism: Secondary | ICD-10-CM

## 2017-05-24 DIAGNOSIS — Z23 Encounter for immunization: Secondary | ICD-10-CM

## 2017-05-24 DIAGNOSIS — F331 Major depressive disorder, recurrent, moderate: Secondary | ICD-10-CM | POA: Diagnosis not present

## 2017-05-24 DIAGNOSIS — F329 Major depressive disorder, single episode, unspecified: Secondary | ICD-10-CM | POA: Insufficient documentation

## 2017-05-24 DIAGNOSIS — Z7901 Long term (current) use of anticoagulants: Secondary | ICD-10-CM

## 2017-05-24 DIAGNOSIS — F32A Depression, unspecified: Secondary | ICD-10-CM | POA: Insufficient documentation

## 2017-05-24 DIAGNOSIS — F411 Generalized anxiety disorder: Secondary | ICD-10-CM

## 2017-05-24 LAB — COAGUCHEK XS/INR WAIVED
INR: 2.5 — ABNORMAL HIGH (ref 0.9–1.1)
Prothrombin Time: 29.7 s

## 2017-05-24 MED ORDER — DULOXETINE HCL 60 MG PO CPEP: 60.0000 mg | ORAL_CAPSULE | Freq: Every day | ORAL | 1 refills | Status: DC

## 2017-05-24 MED ORDER — DULOXETINE HCL 30 MG PO CPEP: 30.0000 mg | ORAL_CAPSULE | Freq: Every day | ORAL | 1 refills | Status: DC

## 2017-05-24 NOTE — Patient Instructions (Addendum)
Description     INR today was 2.5     Major Depressive Disorder, Adult Major depressive disorder (MDD) is a mental health condition. MDD often makes you feel sad, hopeless, or helpless. MDD can also cause symptoms in your body. MDD can affect your:  Work.  School.  Relationships.  Other normal activities.  MDD can range from mild to very bad. It may occur once (single episode MDD). It can also occur many times (recurrent MDD). The main symptoms of MDD often include:  Feeling sad, depressed, or irritable most of the time.  Loss of interest.  MDD symptoms also include:  Sleeping too much or too little.  Eating too much or too little.  A change in your weight.  Feeling tired (fatigue) or having low energy.  Feeling worthless.  Feeling guilty.  Trouble making decisions.  Trouble thinking clearly.  Thoughts of suicide or harming others.  Feeling weak.  Feeling agitated.  Keeping yourself from being around other people (isolation).  Follow these instructions at home: Activity  Do these things as told by your doctor: ? Go back to your normal activities. ? Exercise regularly. ? Spend time outdoors. Alcohol  Talk with your doctor about how alcohol can affect your antidepressant medicines.  Do not drink alcohol. Or, limit how much alcohol you drink. ? This means no more than 1 drink a day for nonpregnant women and 2 drinks a day for men. One drink equals one of these:  12 oz of beer.  5 oz of wine.  1 oz of hard liquor. General instructions  Take over-the-counter and prescription medicines only as told by your doctor.  Eat a healthy diet.  Get plenty of sleep.  Find activities that you enjoy. Make time to do them.  Think about joining a support group. Your doctor may be able to suggest a group for you.  Keep all follow-up visits as told by your doctor. This is important. Where to find more information:  [employer] on Mental  Illness: ? www.nami.[place]: ? https://carter.com/  National Suicide Prevention Lifeline: ? 504-429-0890. This is free, 24-hour help. Contact a doctor if:  Your symptoms get worse.  You have new symptoms. Get help right away if:  You self-harm.  You see, hear, taste, smell, or feel things that are not present (hallucinate). If you ever feel like you may hurt yourself or others, or have thoughts about taking your own life, get help right away. You can go to your nearest emergency department or call:  Your local emergency services (911 in the [place]  A suicide crisis helpline, such as the National Suicide Prevention Lifeline: ? (313)570-6065. This is open 24 hours a day.  This information is not intended to replace advice given to you by your health care provider. Make sure you discuss any questions you have with your health care provider. Document Released: 05/27/2015 Document Revised: 03/01/2016 Document Reviewed: 03/01/2016 Elsevier Interactive Patient Education  2017 [employer].

## 2017-05-24 NOTE — Progress Notes (Signed)
   Subjective:    Patient ID: [name], male    DOB: 1966-02-24, 51 y.o.   MRN: [id number]  Pt presents to the office today for depression and anxiety. Pt currently taking Celexa 60 mg daily,Lamictal 200 mg BID, and Buspar 15 mg BID. PT states over the last several months he feels like his depression and anxiety is becoming worse.   Pt states he has not seen [facility] in years.  Anxiety  Presents for follow-up visit. Symptoms include decreased concentration, depressed mood, excessive worry, irritability, nervous/anxious behavior and restlessness. Symptoms occur most days. The severity of symptoms is moderate.    Depression         This is a chronic problem.  The current episode started more than 1 year ago.   The onset quality is gradual.   The problem occurs intermittently.  The problem has been waxing and waning since onset.  Associated symptoms include decreased concentration, helplessness, hopelessness, restlessness and sad.  Past treatments include SSRIs - Selective serotonin reuptake inhibitors.  Past medical history includes anxiety.   HX PE and DVT PT taking warfarin. See anticoagulation flow sheet.     Review of Systems  Constitutional: Positive for irritability.  Musculoskeletal: Positive for arthralgias, back pain and gait problem.  Psychiatric/Behavioral: Positive for decreased concentration and depression. The patient is nervous/anxious.   All other systems reviewed and are negative.      Objective:   Physical Exam  Constitutional: He is oriented to person, place, and time. He appears well-developed and well-nourished. No distress.  Morbid obese   HENT:  Head: Normocephalic.  Eyes: Pupils are equal, round, and reactive to light. Right eye exhibits no discharge. Left eye exhibits no discharge.  Neck: Normal range of motion. Neck supple. No thyromegaly present.  Cardiovascular: Normal rate, regular rhythm, normal heart sounds and intact distal pulses.  No  murmur heard. Pulmonary/Chest: Effort normal and breath sounds normal. No respiratory distress. He has no wheezes.  Abdominal: Soft. Bowel sounds are normal. He exhibits no distension. There is no tenderness.  Musculoskeletal: He exhibits no edema or tenderness.  Pt in electric wheelchair, generalized weakness related to deconditioned   Neurological: He is alert and oriented to person, place, and time.  Skin: Skin is warm and dry. No rash noted. No erythema.  Psychiatric: He has a normal mood and affect. His behavior is normal. Judgment and thought content normal.  Vitals reviewed.   BP 123/75   Pulse 64   Temp (!) 97.4 F (36.3 C) (Oral)        Assessment & Plan:  1. GAD (generalized anxiety disorder) - DULoxetine (CYMBALTA) 60 MG capsule; Take 1 capsule (30 mg total) by mouth daily.  Dispense: 90 capsule; Refill: 1 - Ambulatory referral to Psychiatry  2. Need for immunization against influenza - Flu Vaccine QUAD 36+ mos IM  3. Moderate episode of recurrent major depressive disorder (HCC) - DULoxetine (CYMBALTA) 60 MG capsule; Take 1 capsule (30 mg total) by mouth daily.  Dispense: 90 capsule; Refill: 1 - Ambulatory referral to Psychiatry  4. History of pulmonary embolism - CoaguChek XS/INR Waived  5. Chronic anticoagulation Description   Continue current dosing  INR today was 2.5    - CoaguChek XS/INR Waived   Will stop Celexa and start Cymbalta Referral to [facility] to help manage patients GAD, Depression and mood  Keep chronic follow up  Evelina Dun, FNP

## 2017-05-25 NOTE — Telephone Encounter (Signed)
Called and spoke with pt's wife [name] (on [place]). She stated that the patient was told he did not qualify for the free Aimovig. He has only had one dose of the Aimovig, but heard it was not good to take with a latex allergy. They are going to do more research on Ajovy and consider a possible switch. They would like to know what Dr. Jaynee Eagles thinks about this and wife stated appt could be made if needed. I told her I would pass the message along and call them back. In the meantime, they will research the medication and will call with any questions or decisions.

## 2017-05-25 NOTE — Telephone Encounter (Signed)
Called patient at home, voicemail box full.   Called pt's wife [name] (on [place]) and LVM (ok per DPR) informing her of Dr. Cathren Laine thoughts on Prue. We can order this for him if he would like to after they research the medication and talk with each other. Asked for a call back when they decide and I left the office number.

## 2017-05-25 NOTE — Telephone Encounter (Signed)
Love Ajovy, we can try and get that approved for him. thanks

## 2017-05-26 ENCOUNTER — Ambulatory Visit: Payer: Medicare Other | Admitting: Neurology

## 2017-06-08 ENCOUNTER — Other Ambulatory Visit: Payer: Self-pay | Admitting: *Deleted

## 2017-06-08 MED ORDER — VITAMIN D (ERGOCALCIFEROL) 1.25 MG (50000 UNIT) PO CAPS: ORAL_CAPSULE | ORAL | 0 refills | Status: DC

## 2017-06-10 ENCOUNTER — Encounter: Payer: Self-pay | Admitting: Family

## 2017-06-10 ENCOUNTER — Ambulatory Visit (INDEPENDENT_AMBULATORY_CARE_PROVIDER_SITE_OTHER): Payer: Medicare Other | Admitting: Family

## 2017-06-10 VITALS — BP 120/75 | HR 70 | Temp 100.9°F | Ht 72.0 in

## 2017-06-10 DIAGNOSIS — Z0289 Encounter for other administrative examinations: Secondary | ICD-10-CM | POA: Diagnosis not present

## 2017-06-10 DIAGNOSIS — F1120 Opioid dependence, uncomplicated: Secondary | ICD-10-CM | POA: Diagnosis not present

## 2017-06-10 DIAGNOSIS — G894 Chronic pain syndrome: Secondary | ICD-10-CM

## 2017-06-10 DIAGNOSIS — G8929 Other chronic pain: Secondary | ICD-10-CM | POA: Diagnosis not present

## 2017-06-10 DIAGNOSIS — J029 Acute pharyngitis, unspecified: Secondary | ICD-10-CM

## 2017-06-10 DIAGNOSIS — M5441 Lumbago with sciatica, right side: Secondary | ICD-10-CM | POA: Diagnosis not present

## 2017-06-10 DIAGNOSIS — J189 Pneumonia, unspecified organism: Secondary | ICD-10-CM

## 2017-06-10 LAB — CULTURE, GROUP A STREP

## 2017-06-10 LAB — RAPID STREP SCREEN (MED CTR MEBANE ONLY): Strep Gp A Ag, IA W/Reflex: NEGATIVE

## 2017-06-10 MED ORDER — DOXYCYCLINE HYCLATE 100 MG PO TABS: 100.0000 mg | ORAL_TABLET | Freq: Two times a day (BID) | ORAL | 0 refills | Status: DC

## 2017-06-10 MED ORDER — HYDROCODONE-ACETAMINOPHEN 7.5-325 MG PO TABS: 1.0000 | ORAL_TABLET | Freq: Four times a day (QID) | ORAL | 0 refills | Status: DC | PRN

## 2017-06-10 MED ORDER — FENTANYL 75 MCG/HR TD PT72: 75.0000 ug | MEDICATED_PATCH | TRANSDERMAL | 0 refills | Status: DC

## 2017-06-10 MED ORDER — PREDNISONE 10 MG (21) PO TBPK: ORAL_TABLET | ORAL | 0 refills | Status: DC

## 2017-06-10 NOTE — Patient Instructions (Signed)

## 2017-06-10 NOTE — Progress Notes (Signed)
Subjective:    Patient ID: [name], male    DOB: 02-Sep-1965, 51 y.o.   MRN: [id number]  Sore Throat   This is a new problem. The current episode started in the past 7 days. The problem has been gradually worsening. The maximum temperature recorded prior to his arrival was 100.4 - 100.9 F. Associated symptoms include congestion, coughing, ear pain, headaches, a hoarse voice, a plugged ear sensation, shortness of breath, swollen glands and trouble swallowing. He has tried acetaminophen and NSAIDs for the symptoms. The treatment provided mild relief.      Review of Systems  HENT: Positive for congestion, ear pain, hoarse voice and trouble swallowing.   Respiratory: Positive for cough and shortness of breath.   Neurological: Positive for headaches.  All other systems reviewed and are negative.      Objective:   Physical Exam  Constitutional: He is oriented to person, place, and time. He appears well-developed and well-nourished. No distress.  HENT:  Head: Normocephalic.  Right Ear: External ear normal. Tympanic membrane is erythematous.  Left Ear: External ear normal. Tympanic membrane is erythematous and bulging.  Nose: Mucosal edema and rhinorrhea present.  Mouth/Throat: Posterior oropharyngeal edema and posterior oropharyngeal erythema present.  Eyes: Pupils are equal, round, and reactive to light. Right eye exhibits no discharge. Left eye exhibits no discharge.  Neck: Normal range of motion. Neck supple. No thyromegaly present.  Cardiovascular: Normal rate, regular rhythm, normal heart sounds and intact distal pulses.  No murmur heard. Pulmonary/Chest: Effort normal. No respiratory distress. He has wheezes.  Abdominal: Soft. Bowel sounds are normal. He exhibits no distension. There is no tenderness.  Musculoskeletal: Normal range of motion. He exhibits no edema or tenderness.  Neurological: He is alert and oriented to person, place, and time.  Skin: Skin is warm and dry.  No rash noted. No erythema.  Psychiatric: He has a normal mood and affect. His behavior is normal. Judgment and thought content normal.  Vitals reviewed.     BP 120/75   Pulse 70   Temp (!) 100.9 F (38.3 C) (Oral)   Ht 6' (1.829 m)   SpO2 (!) 87%   BMI 60.95 kg/m      Assessment & Plan:  1. Sore throat - Rapid Strep Screen (Not at [facility]) - doxycycline (VIBRA-TABS) 100 MG tablet; Take 1 tablet (100 mg total) by mouth 2 (two) times daily.  Dispense: 20 tablet; Refill: 0  2. Chronic pain syndrome - fentaNYL (DURAGESIC - DOSED MCG/HR) 75 MCG/HR; Place 1 patch (75 mcg total) onto the skin every 3 (three) days.  Dispense: 10 patch; Refill: 0 - fentaNYL (DURAGESIC - DOSED MCG/HR) 75 MCG/HR; Place 1 patch (75 mcg total) onto the skin every 3 (three) days.  Dispense: 10 patch; Refill: 0 - fentaNYL (DURAGESIC) 75 MCG/HR; Place 1 patch (75 mcg total) onto the skin every 3 (three) days.  Dispense: 10 patch; Refill: 0 - HYDROcodone-acetaminophen (NORCO) 7.5-325 MG tablet; Take 1 tablet by mouth every 6 (six) hours as needed for moderate pain.  Dispense: 30 tablet; Refill: 0 - HYDROcodone-acetaminophen (NORCO) 7.5-325 MG tablet; Take 1 tablet by mouth every 6 (six) hours as needed for moderate pain.  Dispense: 30 tablet; Refill: 0 - HYDROcodone-acetaminophen (NORCO) 7.5-325 MG tablet; Take 1 tablet by mouth every 6 (six) hours as needed for moderate pain. May fill June 4th, 2018 or after  Dispense: 30 tablet; Refill: 0  3. Pain medication agreement signed - fentaNYL (DURAGESIC - DOSED MCG/HR)  75 MCG/HR; Place 1 patch (75 mcg total) onto the skin every 3 (three) days.  Dispense: 10 patch; Refill: 0 - fentaNYL (DURAGESIC - DOSED MCG/HR) 75 MCG/HR; Place 1 patch (75 mcg total) onto the skin every 3 (three) days.  Dispense: 10 patch; Refill: 0 - fentaNYL (DURAGESIC) 75 MCG/HR; Place 1 patch (75 mcg total) onto the skin every 3 (three) days.  Dispense: 10 patch; Refill: 0 - HYDROcodone-acetaminophen  (NORCO) 7.5-325 MG tablet; Take 1 tablet by mouth every 6 (six) hours as needed for moderate pain.  Dispense: 30 tablet; Refill: 0 - HYDROcodone-acetaminophen (NORCO) 7.5-325 MG tablet; Take 1 tablet by mouth every 6 (six) hours as needed for moderate pain.  Dispense: 30 tablet; Refill: 0 - HYDROcodone-acetaminophen (NORCO) 7.5-325 MG tablet; Take 1 tablet by mouth every 6 (six) hours as needed for moderate pain. May fill June 4th, 2018 or after  Dispense: 30 tablet; Refill: 0  4. Uncomplicated opioid dependence (HCC) - fentaNYL (DURAGESIC - DOSED MCG/HR) 75 MCG/HR; Place 1 patch (75 mcg total) onto the skin every 3 (three) days.  Dispense: 10 patch; Refill: 0 - fentaNYL (DURAGESIC - DOSED MCG/HR) 75 MCG/HR; Place 1 patch (75 mcg total) onto the skin every 3 (three) days.  Dispense: 10 patch; Refill: 0 - fentaNYL (DURAGESIC) 75 MCG/HR; Place 1 patch (75 mcg total) onto the skin every 3 (three) days.  Dispense: 10 patch; Refill: 0 - HYDROcodone-acetaminophen (NORCO) 7.5-325 MG tablet; Take 1 tablet by mouth every 6 (six) hours as needed for moderate pain.  Dispense: 30 tablet; Refill: 0 - HYDROcodone-acetaminophen (NORCO) 7.5-325 MG tablet; Take 1 tablet by mouth every 6 (six) hours as needed for moderate pain.  Dispense: 30 tablet; Refill: 0 - HYDROcodone-acetaminophen (NORCO) 7.5-325 MG tablet; Take 1 tablet by mouth every 6 (six) hours as needed for moderate pain. May fill June 4th, 2018 or after  Dispense: 30 tablet; Refill: 0  5. Chronic bilateral low back pain with right-sided sciatica - fentaNYL (DURAGESIC - DOSED MCG/HR) 75 MCG/HR; Place 1 patch (75 mcg total) onto the skin every 3 (three) days.  Dispense: 10 patch; Refill: 0 - fentaNYL (DURAGESIC - DOSED MCG/HR) 75 MCG/HR; Place 1 patch (75 mcg total) onto the skin every 3 (three) days.  Dispense: 10 patch; Refill: 0 - fentaNYL (DURAGESIC) 75 MCG/HR; Place 1 patch (75 mcg total) onto the skin every 3 (three) days.  Dispense: 10 patch; Refill:  0 - HYDROcodone-acetaminophen (NORCO) 7.5-325 MG tablet; Take 1 tablet by mouth every 6 (six) hours as needed for moderate pain.  Dispense: 30 tablet; Refill: 0 - HYDROcodone-acetaminophen (NORCO) 7.5-325 MG tablet; Take 1 tablet by mouth every 6 (six) hours as needed for moderate pain.  Dispense: 30 tablet; Refill: 0 - HYDROcodone-acetaminophen (NORCO) 7.5-325 MG tablet; Take 1 tablet by mouth every 6 (six) hours as needed for moderate pain. May fill June 4th, 2018 or after  Dispense: 30 tablet; Refill: 0  6. Atypical pneumonia - Take meds as prescribed - Use a cool mist humidifier  -Use saline nose sprays frequently -Saline irrigations of the nose can be very helpful if done frequently. -Force fluids -For any cough or congestion  Use plain Mucinex- regular strength or max strength is fine -For fever or aces or pains- take tylenol or ibuprofen appropriate for age and weight. -Throat lozenges if help -New toothbrush in 3 days - doxycycline (VIBRA-TABS) 100 MG tablet; Take 1 tablet (100 mg total) by mouth 2 (two) times daily.  Dispense: 20 tablet; Refill: 0  Pt reviewed in [place] controlled database- Pt has only received controlled medications from me.   Evelina Dun, FNP

## 2017-06-12 DIAGNOSIS — J449 Chronic obstructive pulmonary disease, unspecified: Secondary | ICD-10-CM | POA: Diagnosis not present

## 2017-06-15 ENCOUNTER — Encounter: Payer: Medicare Other | Admitting: *Deleted

## 2017-06-15 ENCOUNTER — Other Ambulatory Visit: Payer: Self-pay | Admitting: Family

## 2017-06-15 DIAGNOSIS — F411 Generalized anxiety disorder: Secondary | ICD-10-CM

## 2017-06-15 MED ORDER — BUSPIRONE HCL 15 MG PO TABS: ORAL_TABLET | ORAL | 0 refills | Status: DC

## 2017-06-15 MED ORDER — WARFARIN SODIUM 5 MG PO TABS: ORAL_TABLET | ORAL | 1 refills | Status: DC

## 2017-06-28 ENCOUNTER — Other Ambulatory Visit: Payer: Self-pay | Admitting: Family

## 2017-07-13 ENCOUNTER — Telehealth: Payer: Self-pay

## 2017-07-13 ENCOUNTER — Encounter: Payer: Self-pay | Admitting: Family

## 2017-07-13 ENCOUNTER — Ambulatory Visit (INDEPENDENT_AMBULATORY_CARE_PROVIDER_SITE_OTHER): Payer: Medicare Other | Admitting: Family

## 2017-07-13 VITALS — BP 110/75 | HR 70 | Temp 98.1°F

## 2017-07-13 DIAGNOSIS — N401 Enlarged prostate with lower urinary tract symptoms: Secondary | ICD-10-CM | POA: Diagnosis not present

## 2017-07-13 DIAGNOSIS — E1169 Type 2 diabetes mellitus with other specified complication: Secondary | ICD-10-CM

## 2017-07-13 DIAGNOSIS — G8929 Other chronic pain: Secondary | ICD-10-CM

## 2017-07-13 DIAGNOSIS — Z0289 Encounter for other administrative examinations: Secondary | ICD-10-CM | POA: Diagnosis not present

## 2017-07-13 DIAGNOSIS — R609 Edema, unspecified: Secondary | ICD-10-CM

## 2017-07-13 DIAGNOSIS — G894 Chronic pain syndrome: Secondary | ICD-10-CM | POA: Diagnosis not present

## 2017-07-13 DIAGNOSIS — E559 Vitamin D deficiency, unspecified: Secondary | ICD-10-CM | POA: Diagnosis not present

## 2017-07-13 DIAGNOSIS — J449 Chronic obstructive pulmonary disease, unspecified: Secondary | ICD-10-CM | POA: Diagnosis not present

## 2017-07-13 DIAGNOSIS — E1165 Type 2 diabetes mellitus with hyperglycemia: Secondary | ICD-10-CM | POA: Diagnosis not present

## 2017-07-13 DIAGNOSIS — E785 Hyperlipidemia, unspecified: Secondary | ICD-10-CM | POA: Diagnosis not present

## 2017-07-13 DIAGNOSIS — F331 Major depressive disorder, recurrent, moderate: Secondary | ICD-10-CM | POA: Diagnosis not present

## 2017-07-13 DIAGNOSIS — F1120 Opioid dependence, uncomplicated: Secondary | ICD-10-CM | POA: Diagnosis not present

## 2017-07-13 DIAGNOSIS — D649 Anemia, unspecified: Secondary | ICD-10-CM

## 2017-07-13 DIAGNOSIS — Z794 Long term (current) use of insulin: Secondary | ICD-10-CM | POA: Diagnosis not present

## 2017-07-13 DIAGNOSIS — R300 Dysuria: Secondary | ICD-10-CM

## 2017-07-13 DIAGNOSIS — M5441 Lumbago with sciatica, right side: Secondary | ICD-10-CM

## 2017-07-13 DIAGNOSIS — J810 Acute pulmonary edema: Secondary | ICD-10-CM

## 2017-07-13 DIAGNOSIS — G40909 Epilepsy, unspecified, not intractable, without status epilepticus: Secondary | ICD-10-CM | POA: Diagnosis not present

## 2017-07-13 DIAGNOSIS — E349 Endocrine disorder, unspecified: Secondary | ICD-10-CM

## 2017-07-13 DIAGNOSIS — Z7901 Long term (current) use of anticoagulants: Secondary | ICD-10-CM

## 2017-07-13 DIAGNOSIS — I82409 Acute embolism and thrombosis of unspecified deep veins of unspecified lower extremity: Secondary | ICD-10-CM

## 2017-07-13 LAB — URINALYSIS, COMPLETE
Bilirubin, UA: NEGATIVE
Ketones, UA: NEGATIVE
Nitrite, UA: NEGATIVE
Protein, UA: NEGATIVE
Specific Gravity, UA: 1.015 (ref 1.005–1.030)
Urobilinogen, Ur: 0.2 mg/dL (ref 0.2–1.0)
pH, UA: 7 (ref 5.0–7.5)

## 2017-07-13 LAB — COAGUCHEK XS/INR WAIVED
INR: 4.9 — ABNORMAL HIGH (ref 0.9–1.1)
Prothrombin Time: 58.6 s

## 2017-07-13 LAB — MICROSCOPIC EXAMINATION
Renal Epithel, UA: NONE SEEN /hpf
WBC, UA: >30 /hpf — AB (ref 0–?)

## 2017-07-13 LAB — BAYER DCA HB A1C WAIVED: HB A1C (BAYER DCA - WAIVED): 9.1 % — ABNORMAL HIGH (ref <7.0)

## 2017-07-13 MED ORDER — INSULIN LISPRO 100 UNIT/ML (KWIKPEN): PEN_INJECTOR | SUBCUTANEOUS | 0 refills | Status: DC

## 2017-07-13 NOTE — Progress Notes (Signed)
Subjective:    Patient ID: [name], male    DOB: 02-25-1966, 52 y.o.   MRN: [id number]  Pt presents to the office today for chronic follow up. Pt is followed by neurologists every 6 months for migraines and seizures. PT is followed by Ortho  for osteoarthritis.   Pt complaining of peripheral edema that is worse over the last few weeks. Pt taking lasix 80 mg BID.  Diabetes  He presents for his follow-up diabetic visit. He has type 2 diabetes mellitus. There are no hypoglycemic associated symptoms. Associated symptoms include foot paresthesias. Pertinent negatives for diabetes include no blurred vision and no visual change. There are no hypoglycemic complications. Symptoms are worsening. Diabetic complications include heart disease and peripheral neuropathy. Risk factors for coronary artery disease include dyslipidemia, diabetes mellitus, sedentary lifestyle, male sex and hypertension. He is following a generally unhealthy diet. His breakfast blood glucose range is generally >200 mg/dl.  Hyperlipidemia  This is a chronic problem. The current episode started more than 1 year ago. The problem is controlled. Recent lipid tests were reviewed and are normal. Exacerbating diseases include obesity. Associated symptoms include shortness of breath. Current antihyperlipidemic treatment includes statins. The current treatment provides moderate improvement of lipids. Risk factors for coronary artery disease include diabetes mellitus, dyslipidemia, male sex, obesity, post-menopausal and a sedentary lifestyle.  Hypertension  This is a chronic problem. The current episode started more than 1 year ago. The problem has been resolved since onset. The problem is controlled. Associated symptoms include malaise/fatigue, peripheral edema and shortness of breath. Pertinent negatives include no blurred vision. Risk factors for coronary artery disease include dyslipidemia, diabetes mellitus, obesity, male gender and  sedentary lifestyle. The current treatment provides moderate improvement. Hypertensive end-organ damage includes kidney disease, CAD/MI and heart failure.  Depression         This is a chronic problem.  The current episode started more than 1 year ago.   The onset quality is gradual.   The problem occurs intermittently.  Associated symptoms include decreased interest and sad.  Associated symptoms include no helplessness and no hopelessness. Benign Prostatic Hypertrophy  This is a chronic problem. The current episode started more than 1 year ago. The problem has been waxing and waning since onset. Irritative symptoms include frequency and nocturia. Associated symptoms include dysuria.      Review of Systems  Constitutional: Positive for malaise/fatigue.  Eyes: Negative for blurred vision.  Respiratory: Positive for shortness of breath.   Genitourinary: Positive for dysuria, frequency and nocturia.  Psychiatric/Behavioral: Positive for depression.  All other systems reviewed and are negative.      Objective:   Physical Exam  Constitutional: He is oriented to person, place, and time. He appears well-developed and well-nourished. No distress.  Morbid obese   HENT:  Head: Normocephalic.  Right Ear: External ear normal.  Left Ear: External ear normal.  Nose: Nose normal.  Mouth/Throat: Oropharynx is clear and moist.  Eyes: Pupils are equal, round, and reactive to light. Right eye exhibits no discharge. Left eye exhibits no discharge.  Neck: Normal range of motion. Neck supple. No thyromegaly present.  Cardiovascular: Normal rate, regular rhythm, normal heart sounds and intact distal pulses.  No murmur heard. Pulmonary/Chest: Effort normal and breath sounds normal. No respiratory distress. He has no wheezes.  Abdominal: Soft. Bowel sounds are normal. He exhibits no distension. There is no tenderness.  Musculoskeletal: Normal range of motion. He exhibits edema (trace in BLE). He exhibits  no  tenderness.  Neurological: He is alert and oriented to person, place, and time.  Skin: Skin is warm and dry. No rash noted. No erythema.  Psychiatric: He has a normal mood and affect. His behavior is normal. Judgment and thought content normal.  Vitals reviewed.    BP 110/75   Pulse 70   Temp 98.1 F (36.7 C) (Oral)      Assessment & Plan:  1. Type 2 diabetes mellitus with hyperglycemia, with long-term current use of insulin (HCC) - Bayer DCA Hb A1c Waived - CMP14+EGFR - CBC with Differential/Platelet  2. Hyperlipidemia associated with type 2 diabetes mellitus (HCC) - CMP14+EGFR - Lipid panel - CBC with Differential/Platelet  3. Morbid obesity ([place]) - CMP14+EGFR - CBC with Differential/Platelet  4. Uncomplicated opioid dependence ([place]) - CMP14+EGFR - CBC with Differential/Platelet  5. Pain medication agreement signed - CMP14+EGFR - CBC with Differential/Platelet  6. Moderate episode of recurrent major depressive disorder (HCC)  - CMP14+EGFR - CBC with Differential/Platelet  7. Seizure disorder ([place]) - CMP14+EGFR - CBC with Differential/Platelet  8. Chronic pain syndrome - CMP14+EGFR - CBC with Differential/Platelet  9. Benign prostatic hyperplasia with lower urinary tract symptoms, symptom details unspecified  - CMP14+EGFR - PSA, total and free - CBC with Differential/Platelet  10. Peripheral edema - CMP14+EGFR - Brain natriuretic peptide - CBC with Differential/Platelet  11. Dysuria  - CMP14+EGFR - PSA, total and free - Urinalysis, Complete - Urine Culture - CBC with Differential/Platelet  12. Vitamin D deficiency - CMP14+EGFR - VITAMIN D 25 Hydroxy (Vit-D Deficiency, Fractures) - CBC with Differential/Platelet  13. Chronic anticoagulation - CoaguChek XS/INR Waived  14. Anemia, unspecified type   15. Testosterone deficiency  - Testosterone,Free and Total  16. Chronic bilateral low back pain with right-sided sciatica  13.  Chronic anticoagulation - CoaguChek XS/INR Waived  14. Anemia, unspecified type  15. Testosterone deficiency - Testosterone,Free and Total  16. Chronic bilateral low back pain with right-sided sciatica  17. Postoperative pulmonary edema (HCC)   18. Deep vein thrombosis (DVT) of lower extremity, unspecified chronicity, unspecified laterality, unspecified vein (HCC) Description   Hold today and tomorrow's dose. Then resume current dosing. Follow up in 2 weeks   INR today was 4.9        Continue all meds Labs pending Health Maintenance reviewed Diet and exercise encouraged RTO 3 months   Evelina Dun, FNP

## 2017-07-13 NOTE — Patient Instructions (Signed)

## 2017-07-14 ENCOUNTER — Other Ambulatory Visit: Payer: Self-pay | Admitting: Family

## 2017-07-14 LAB — URINE CULTURE: Organism ID, Bacteria: NO GROWTH

## 2017-07-14 LAB — CBC WITH DIFFERENTIAL/PLATELET
Basophils Absolute: 0 10*3/uL (ref 0.0–0.2)
Basos: 1 %
EOS (ABSOLUTE): 0.1 10*3/uL (ref 0.0–0.4)
Eos: 3 %
Hematocrit: 41 % (ref 37.5–51.0)
Hemoglobin: 12.5 g/dL — ABNORMAL LOW (ref 13.0–17.7)
Immature Grans (Abs): 0 10*3/uL (ref 0.0–0.1)
Immature Granulocytes: 0 %
Lymphocytes Absolute: 1.2 10*3/uL (ref 0.7–3.1)
Lymphs: 32 %
MCH: 26.2 pg — ABNORMAL LOW (ref 26.6–33.0)
MCHC: 30.5 g/dL — ABNORMAL LOW (ref 31.5–35.7)
MCV: 86 fL (ref 79–97)
Monocytes Absolute: 0.4 10*3/uL (ref 0.1–0.9)
Monocytes: 10 %
Neutrophils Absolute: 2 10*3/uL (ref 1.4–7.0)
Neutrophils: 54 %
Platelets: 95 10*3/uL — CL (ref 150–379)
RBC: 4.78 x10E6/uL (ref 4.14–5.80)
RDW: 16.3 % — ABNORMAL HIGH (ref 12.3–15.4)
WBC: 3.8 10*3/uL (ref 3.4–10.8)

## 2017-07-14 LAB — PSA, TOTAL AND FREE
PSA, Free: <0.01 ng/mL
Prostate Specific Ag, Serum: <0.1 ng/mL (ref 0.0–4.0)

## 2017-07-14 LAB — CMP14+EGFR
ALT: 27 IU/L (ref 0–44)
AST: 53 IU/L — ABNORMAL HIGH (ref 0–40)
Albumin/Globulin Ratio: 0.8 — ABNORMAL LOW (ref 1.2–2.2)
Albumin: 3.2 g/dL — ABNORMAL LOW (ref 3.5–5.5)
Alkaline Phosphatase: 108 IU/L (ref 39–117)
BUN/Creatinine Ratio: 13 (ref 9–20)
BUN: 22 mg/dL (ref 6–24)
Bilirubin Total: 0.5 mg/dL (ref 0.0–1.2)
CO2: 27 mmol/L (ref 20–29)
Calcium: 8.8 mg/dL (ref 8.7–10.2)
Chloride: 99 mmol/L (ref 96–106)
Creatinine, Ser: 1.69 mg/dL — ABNORMAL HIGH (ref 0.76–1.27)
GFR calc Af Amer: 54 mL/min/{1.73_m2} — ABNORMAL LOW (ref >59)
GFR calc non Af Amer: 46 mL/min/{1.73_m2} — ABNORMAL LOW (ref >59)
Globulin, Total: 4.2 g/dL (ref 1.5–4.5)
Glucose: 189 mg/dL — ABNORMAL HIGH (ref 65–99)
Potassium: 4.7 mmol/L (ref 3.5–5.2)
Sodium: 139 mmol/L (ref 134–144)
Total Protein: 7.4 g/dL (ref 6.0–8.5)

## 2017-07-14 LAB — LIPID PANEL
Chol/HDL Ratio: 3.2 ratio (ref 0.0–5.0)
Cholesterol, Total: 98 mg/dL — ABNORMAL LOW (ref 100–199)
HDL: 31 mg/dL — ABNORMAL LOW (ref >39)
LDL Calculated: 36 mg/dL (ref 0–99)
Triglycerides: 157 mg/dL — ABNORMAL HIGH (ref 0–149)
VLDL Cholesterol Cal: 31 mg/dL (ref 5–40)

## 2017-07-14 LAB — VITAMIN D 25 HYDROXY (VIT D DEFICIENCY, FRACTURES): Vit D, 25-Hydroxy: 35.1 ng/mL (ref 30.0–100.0)

## 2017-07-14 LAB — TESTOSTERONE,FREE AND TOTAL
Testosterone, Free: 0.7 pg/mL — ABNORMAL LOW (ref 7.2–24.0)
Testosterone: 17 ng/dL — ABNORMAL LOW (ref 264–916)

## 2017-07-14 LAB — BRAIN NATRIURETIC PEPTIDE: BNP: 27 pg/mL (ref 0.0–100.0)

## 2017-07-14 MED ORDER — CIPROFLOXACIN HCL 500 MG PO TABS: 500.0000 mg | ORAL_TABLET | Freq: Two times a day (BID) | ORAL | 0 refills | Status: DC

## 2017-07-14 NOTE — Telephone Encounter (Signed)
.  .  .        xx

## 2017-07-15 ENCOUNTER — Telehealth: Payer: Self-pay | Admitting: Family

## 2017-07-15 NOTE — Telephone Encounter (Signed)
Moved to lab pool

## 2017-07-18 ENCOUNTER — Encounter: Payer: Self-pay | Admitting: Family

## 2017-07-19 ENCOUNTER — Other Ambulatory Visit: Payer: Self-pay | Admitting: Family

## 2017-07-19 DIAGNOSIS — E349 Endocrine disorder, unspecified: Secondary | ICD-10-CM

## 2017-07-21 ENCOUNTER — Other Ambulatory Visit: Payer: Self-pay | Admitting: *Deleted

## 2017-07-21 MED ORDER — INSULIN LISPRO 100 UNIT/ML (KWIKPEN): 35.0000 [IU] | PEN_INJECTOR | Freq: Three times a day (TID) | SUBCUTANEOUS | 0 refills | Status: DC

## 2017-07-24 ENCOUNTER — Other Ambulatory Visit: Payer: Self-pay | Admitting: Family

## 2017-07-24 ENCOUNTER — Other Ambulatory Visit: Payer: Self-pay | Admitting: Neurology

## 2017-07-28 ENCOUNTER — Ambulatory Visit (INDEPENDENT_AMBULATORY_CARE_PROVIDER_SITE_OTHER): Payer: Medicare Other | Admitting: *Deleted

## 2017-07-28 DIAGNOSIS — J810 Acute pulmonary edema: Secondary | ICD-10-CM | POA: Diagnosis not present

## 2017-07-28 DIAGNOSIS — I82409 Acute embolism and thrombosis of unspecified deep veins of unspecified lower extremity: Secondary | ICD-10-CM

## 2017-07-28 LAB — COAGUCHEK XS/INR WAIVED
INR: 3.5 — ABNORMAL HIGH (ref 0.9–1.1)
Prothrombin Time: 41.8 s

## 2017-07-28 NOTE — Progress Notes (Signed)
Subjective:     Indication: DVT Bleeding signs/symptoms: None Thromboembolic signs/symptoms: None  Missed Coumadin doses: None Medication changes: no Dietary changes: no Bacterial/viral infection: no Other concerns: no  The following portions of the patient's history were reviewed and updated as appropriate: allergies and current medications.  Review of Systems Pertinent items are noted in HPI.   Objective:    INR Today: 3.5 Current dose: 7.5 mg on Wed and Sat and 5 mg all other days    Assessment:    Supratherapeutic INR for goal of 2-3   Plan:    1. New dose: 5mg  daily   2. Next INR: 1 month    Chong Sicilian, RN

## 2017-07-30 ENCOUNTER — Other Ambulatory Visit: Payer: Self-pay | Admitting: *Deleted

## 2017-07-30 MED ORDER — INSULIN GLARGINE 300 UNIT/ML ~~LOC~~ SOPN: 190.0000 [IU] | PEN_INJECTOR | Freq: Every day | SUBCUTANEOUS | 0 refills | Status: DC

## 2017-08-04 ENCOUNTER — Other Ambulatory Visit: Payer: Self-pay | Admitting: Family

## 2017-08-04 ENCOUNTER — Other Ambulatory Visit: Payer: Self-pay | Admitting: *Deleted

## 2017-08-04 DIAGNOSIS — H5010 Unspecified exotropia: Secondary | ICD-10-CM | POA: Diagnosis not present

## 2017-08-04 DIAGNOSIS — H52203 Unspecified astigmatism, bilateral: Secondary | ICD-10-CM | POA: Diagnosis not present

## 2017-08-04 DIAGNOSIS — H04123 Dry eye syndrome of bilateral lacrimal glands: Secondary | ICD-10-CM | POA: Diagnosis not present

## 2017-08-04 DIAGNOSIS — E119 Type 2 diabetes mellitus without complications: Secondary | ICD-10-CM | POA: Diagnosis not present

## 2017-08-04 LAB — HM DIABETES EYE EXAM

## 2017-08-11 ENCOUNTER — Ambulatory Visit: Payer: Medicare Other | Admitting: "Endocrinology

## 2017-08-13 DIAGNOSIS — J449 Chronic obstructive pulmonary disease, unspecified: Secondary | ICD-10-CM | POA: Diagnosis not present

## 2017-08-22 ENCOUNTER — Other Ambulatory Visit: Payer: Self-pay | Admitting: Family

## 2017-08-22 DIAGNOSIS — F32A Depression, unspecified: Secondary | ICD-10-CM

## 2017-08-22 DIAGNOSIS — F329 Major depressive disorder, single episode, unspecified: Secondary | ICD-10-CM

## 2017-08-23 NOTE — Telephone Encounter (Signed)
Last seen 07/13/17  [facility]

## 2017-08-25 ENCOUNTER — Encounter: Payer: Self-pay | Admitting: Family Medicine

## 2017-08-25 ENCOUNTER — Ambulatory Visit (INDEPENDENT_AMBULATORY_CARE_PROVIDER_SITE_OTHER): Payer: Medicare Other | Admitting: Family Medicine

## 2017-08-25 VITALS — BP 113/72 | HR 67 | Temp 99.5°F

## 2017-08-25 DIAGNOSIS — I82409 Acute embolism and thrombosis of unspecified deep veins of unspecified lower extremity: Secondary | ICD-10-CM | POA: Diagnosis not present

## 2017-08-25 LAB — COAGUCHEK XS/INR WAIVED
INR: 3.3 — ABNORMAL HIGH (ref 0.9–1.1)
Prothrombin Time: 40 s

## 2017-08-25 NOTE — Patient Instructions (Signed)
Description   Decrease dose to 1 tablet (5mg ) daily, except on Tuesdays and Saturdays when he only takes 2.5 mg 1/2 tablet.  INR today was 3.3 and your goal is 2.0-3.0  Return in 4 weeks

## 2017-08-25 NOTE — Progress Notes (Signed)
   BP 113/72   Pulse 67   Temp 99.5 F (37.5 C) (Oral)    Subjective:    Patient ID: [name], male    DOB: [birth year], 52 y.o.   MRN: [id number]  HPI: [name] is a 52 y.o. male presenting on 08/25/2017 for Coagulation Disorder   HPI Previous DVT and recheck for INR and Coumadin Patient is coming in today for Coumadin recheck and history of DVTs.  He is currently taking 5 mg or 1 tablet every day of the week which was lower from the previous one.  He denies any bleeding or bruising abnormally.  He denies any changes in medication recently, he did have an antibiotic and prednisone in December but none since early January.  Relevant past medical, surgical, family and social history reviewed and updated as indicated. Interim medical history since our last visit reviewed. Allergies and medications reviewed and updated.  Review of Systems  Constitutional: Negative for chills and fever.  Respiratory: Negative for shortness of breath and wheezing.   Cardiovascular: Negative for chest pain and leg swelling.  Gastrointestinal: Negative for blood in stool.  Musculoskeletal: Negative for back pain and gait problem.  Skin: Negative for rash.  All other systems reviewed and are negative.   Per HPI unless specifically indicated above        Objective:    BP 113/72   Pulse 67   Temp 99.5 F (37.5 C) (Oral)   Wt Readings from Last 3 Encounters:  03/31/17 (!) 449 lb 6.4 oz (203.8 kg)  12/31/16 (!) 400 lb (181.4 kg)  10/07/16 (!) 400 lb (181.4 kg)    Physical Exam  Constitutional: He is oriented to person, place, and time. He appears well-developed and well-nourished. No distress.  Eyes: Conjunctivae are normal. No scleral icterus.  Musculoskeletal: Normal range of motion.  Neurological: He is alert and oriented to person, place, and time. Coordination normal.  Skin: Skin is warm and dry. No rash noted. He is not diaphoretic.  Psychiatric: He has a normal mood and  affect. His behavior is normal.  Nursing note and vitals reviewed.      Assessment & Plan:   Problem List Items Addressed This Visit      Cardiovascular and Mediastinum   Deep vein thrombosis (DVT) ([place]) - Primary   Relevant Orders   CoaguChek XS/INR Waived     Respiratory   Postoperative pulmonary edema ([place])      Follow up plan: Return in about 4 weeks (around 09/22/2017), or if symptoms worsen or fail to improve.  Counseling provided for all of the vaccine components Orders Placed This Encounter  Procedures  . CoaguChek XS/INR [place], MD [facility] 08/25/2017, 1:33 PM

## 2017-09-01 DIAGNOSIS — M19012 Primary osteoarthritis, left shoulder: Secondary | ICD-10-CM | POA: Diagnosis not present

## 2017-09-01 DIAGNOSIS — M19011 Primary osteoarthritis, right shoulder: Secondary | ICD-10-CM | POA: Diagnosis not present

## 2017-09-06 ENCOUNTER — Other Ambulatory Visit: Payer: Self-pay | Admitting: Family

## 2017-09-10 DIAGNOSIS — J449 Chronic obstructive pulmonary disease, unspecified: Secondary | ICD-10-CM | POA: Diagnosis not present

## 2017-09-11 ENCOUNTER — Other Ambulatory Visit: Payer: Self-pay | Admitting: Family

## 2017-09-14 ENCOUNTER — Other Ambulatory Visit: Payer: Self-pay | Admitting: *Deleted

## 2017-09-14 DIAGNOSIS — F411 Generalized anxiety disorder: Secondary | ICD-10-CM

## 2017-09-14 MED ORDER — WARFARIN SODIUM 5 MG PO TABS: ORAL_TABLET | ORAL | 0 refills | Status: DC

## 2017-09-14 MED ORDER — CITALOPRAM HYDROBROMIDE 20 MG PO TABS: 60.0000 mg | ORAL_TABLET | Freq: Every day | ORAL | 0 refills | Status: DC

## 2017-09-21 ENCOUNTER — Other Ambulatory Visit: Payer: Self-pay | Admitting: Family

## 2017-09-22 ENCOUNTER — Encounter: Payer: Self-pay | Admitting: Family Medicine

## 2017-09-22 ENCOUNTER — Telehealth: Payer: Self-pay | Admitting: Family Medicine

## 2017-09-22 ENCOUNTER — Ambulatory Visit (INDEPENDENT_AMBULATORY_CARE_PROVIDER_SITE_OTHER): Payer: Medicare Other | Admitting: Family Medicine

## 2017-09-22 VITALS — BP 131/85 | HR 67 | Temp 98.6°F

## 2017-09-22 DIAGNOSIS — G8929 Other chronic pain: Secondary | ICD-10-CM

## 2017-09-22 DIAGNOSIS — I82409 Acute embolism and thrombosis of unspecified deep veins of unspecified lower extremity: Secondary | ICD-10-CM

## 2017-09-22 DIAGNOSIS — Z0289 Encounter for other administrative examinations: Secondary | ICD-10-CM | POA: Diagnosis not present

## 2017-09-22 DIAGNOSIS — M5441 Lumbago with sciatica, right side: Secondary | ICD-10-CM

## 2017-09-22 DIAGNOSIS — G894 Chronic pain syndrome: Secondary | ICD-10-CM

## 2017-09-22 DIAGNOSIS — J810 Acute pulmonary edema: Secondary | ICD-10-CM

## 2017-09-22 DIAGNOSIS — F1120 Opioid dependence, uncomplicated: Secondary | ICD-10-CM

## 2017-09-22 LAB — COAGUCHEK XS/INR WAIVED
INR: 2.8 — ABNORMAL HIGH (ref 0.9–1.1)
Prothrombin Time: 34.1 s

## 2017-09-22 MED ORDER — FENTANYL 75 MCG/HR TD PT72
75.0000 ug | MEDICATED_PATCH | TRANSDERMAL | 0 refills | Status: DC
Start: 2017-09-22 — End: 2017-09-22

## 2017-09-22 NOTE — Telephone Encounter (Signed)
Patient requesting opiate pain medication refill, will forward to PCP

## 2017-09-22 NOTE — Progress Notes (Signed)
BP 131/85   Pulse 67   Temp 98.6 F (37 C) (Oral)    Subjective:    Patient ID: [name], male    DOB: 12-Oct-1965, 52 y.o.   MRN: [id number]  HPI: [name] is a 52 y.o. male presenting on 09/22/2017 for Coagulation Disorder   HPI Coumadin recheck Patient is coming in today for Coumadin recheck.  He has been having trouble getting in to see Alyse Low who is his usual provider who prescribes his opiates, we will send a message to her for his pain medication.  Patient denies any episodes of bleeding or problems with bleeding.  Relevant past medical, surgical, family and social history reviewed and updated as indicated. Interim medical history since our last visit reviewed. Allergies and medications reviewed and updated.  Review of Systems  Constitutional: Negative for chills and fever.  Respiratory: Negative for shortness of breath and wheezing.   Cardiovascular: Negative for chest pain and leg swelling.  Musculoskeletal: Negative for back pain and gait problem.  Skin: Negative for rash.  All other systems reviewed and are negative.   Per HPI unless specifically indicated above        Objective:    BP 131/85   Pulse 67   Temp 98.6 F (37 C) (Oral)   Wt Readings from Last 3 Encounters:  03/31/17 (!) 449 lb 6.4 oz (203.8 kg)  12/31/16 (!) 400 lb (181.4 kg)  10/07/16 (!) 400 lb (181.4 kg)    Physical Exam  Constitutional: He is oriented to person, place, and time. He appears well-developed and well-nourished. No distress.  Eyes: Conjunctivae are normal. No scleral icterus.  Neck: Neck supple. No thyromegaly present.  Cardiovascular: Normal rate, regular rhythm, normal heart sounds and intact distal pulses.  No murmur heard. Pulmonary/Chest: Effort normal and breath sounds normal. No respiratory distress. He has no wheezes.  Musculoskeletal: Normal range of motion. He exhibits no edema.  Lymphadenopathy:    He has no cervical adenopathy.  Neurological:  He is alert and oriented to person, place, and time. Coordination normal.  Skin: Skin is warm and dry. No rash noted. He is not diaphoretic.  Psychiatric: He has a normal mood and affect. His behavior is normal.  Nursing note and vitals reviewed.   Description   Continue dose at 1 tablet (5mg ) daily, except on Tuesdays and Saturdays when he only takes 2.5 mg 1/2 tablet.  INR today was 2.8 and your goal is 2.0-3.0  Return in 4 weeks        Assessment & Plan:   Problem List Items Addressed This Visit      Cardiovascular and Mediastinum   Deep vein thrombosis (DVT) ([place]) - Primary   Relevant Orders   CoaguChek XS/INR Waived     Respiratory   Postoperative pulmonary edema (HCC)     Other   Chronic pain syndrome   Opioid dependence (HCC)   Pain medication agreement signed    Other Visit Diagnoses    Chronic bilateral low back pain with right-sided sciatica        Patient has chronic low back pain and was requesting a fentanyl prescription for pain.  I offered 2 weeks and we can get him into see his PCP but patient says that he cannot come in that day and is coming in 1 month, we will send a message to PCP for possible refills.   Follow up plan: Return in about 1 month (around 10/20/2017), or if symptoms worsen  or fail to improve, for Diabetes and Coumadin recheck.  Counseling provided for all of the vaccine components Orders Placed This Encounter  Procedures  . CoaguChek XS/INR [place], MD [facility] 09/22/2017, 2:04 PM  Gave patient 2 weeks of pain medication so we can get him on scheduled to see [facility]

## 2017-09-22 NOTE — Patient Instructions (Signed)
Description   Continue dose at 1 tablet (5mg ) daily, except on Tuesdays and Saturdays when he only takes 2.5 mg 1/2 tablet.  INR today was 2.8 and your goal is 2.0-3.0  Return in 4 weeks

## 2017-09-23 MED ORDER — HYDROCODONE-ACETAMINOPHEN 7.5-325 MG PO TABS: 1.0000 | ORAL_TABLET | Freq: Four times a day (QID) | ORAL | 0 refills | Status: DC | PRN

## 2017-09-23 MED ORDER — FENTANYL 75 MCG/HR TD PT72: 75.0000 ug | MEDICATED_PATCH | TRANSDERMAL | 0 refills | Status: DC

## 2017-09-23 NOTE — Telephone Encounter (Signed)
What medications does patient need? He was seen on 07/13/17 and was given 3 month on controlled medications.

## 2017-09-23 NOTE — Addendum Note (Signed)
Addended by: Evelina Dun A on: 09/23/2017 12:58 PM   Modules accepted: Orders

## 2017-09-23 NOTE — Telephone Encounter (Signed)
It looks like they were filled on 06/10/2017 when you saw him for sore throat?

## 2017-09-23 NOTE — Telephone Encounter (Signed)
Prescriptions sent to pharmacy

## 2017-09-29 ENCOUNTER — Other Ambulatory Visit: Payer: Self-pay | Admitting: Family

## 2017-10-11 DIAGNOSIS — J449 Chronic obstructive pulmonary disease, unspecified: Secondary | ICD-10-CM | POA: Diagnosis not present

## 2017-10-14 ENCOUNTER — Other Ambulatory Visit: Payer: Self-pay | Admitting: Family

## 2017-10-21 ENCOUNTER — Encounter: Payer: Self-pay | Admitting: Family

## 2017-10-21 ENCOUNTER — Ambulatory Visit (INDEPENDENT_AMBULATORY_CARE_PROVIDER_SITE_OTHER): Payer: Medicare Other | Admitting: Family

## 2017-10-21 VITALS — BP 137/88 | HR 78 | Temp 99.2°F

## 2017-10-21 DIAGNOSIS — Z7901 Long term (current) use of anticoagulants: Secondary | ICD-10-CM

## 2017-10-21 DIAGNOSIS — G894 Chronic pain syndrome: Secondary | ICD-10-CM | POA: Diagnosis not present

## 2017-10-21 DIAGNOSIS — Z86711 Personal history of pulmonary embolism: Secondary | ICD-10-CM

## 2017-10-21 DIAGNOSIS — E1169 Type 2 diabetes mellitus with other specified complication: Secondary | ICD-10-CM

## 2017-10-21 DIAGNOSIS — F1120 Opioid dependence, uncomplicated: Secondary | ICD-10-CM

## 2017-10-21 DIAGNOSIS — F331 Major depressive disorder, recurrent, moderate: Secondary | ICD-10-CM | POA: Diagnosis not present

## 2017-10-21 DIAGNOSIS — E1165 Type 2 diabetes mellitus with hyperglycemia: Secondary | ICD-10-CM

## 2017-10-21 DIAGNOSIS — E785 Hyperlipidemia, unspecified: Secondary | ICD-10-CM | POA: Diagnosis not present

## 2017-10-21 DIAGNOSIS — M5441 Lumbago with sciatica, right side: Secondary | ICD-10-CM | POA: Diagnosis not present

## 2017-10-21 DIAGNOSIS — Z794 Long term (current) use of insulin: Secondary | ICD-10-CM | POA: Diagnosis not present

## 2017-10-21 DIAGNOSIS — E349 Endocrine disorder, unspecified: Secondary | ICD-10-CM

## 2017-10-21 DIAGNOSIS — Z0289 Encounter for other administrative examinations: Secondary | ICD-10-CM

## 2017-10-21 DIAGNOSIS — G8929 Other chronic pain: Secondary | ICD-10-CM

## 2017-10-21 DIAGNOSIS — G40909 Epilepsy, unspecified, not intractable, without status epilepticus: Secondary | ICD-10-CM | POA: Diagnosis not present

## 2017-10-21 LAB — COAGUCHEK XS/INR WAIVED
INR: 2.6 — ABNORMAL HIGH (ref 0.9–1.1)
Prothrombin Time: 31.6 s

## 2017-10-21 LAB — BAYER DCA HB A1C WAIVED: HB A1C (BAYER DCA - WAIVED): 8.8 % — ABNORMAL HIGH

## 2017-10-21 MED ORDER — FENTANYL 75 MCG/HR TD PT72: 75.0000 ug | MEDICATED_PATCH | TRANSDERMAL | 0 refills | Status: DC

## 2017-10-21 MED ORDER — HYDROCODONE-ACETAMINOPHEN 7.5-325 MG PO TABS: 1.0000 | ORAL_TABLET | Freq: Four times a day (QID) | ORAL | 0 refills | Status: DC | PRN

## 2017-10-21 MED ORDER — EMPAGLIFLOZIN 25 MG PO TABS: 25.0000 mg | ORAL_TABLET | Freq: Every day | ORAL | 1 refills | Status: DC

## 2017-10-21 MED ORDER — HYDROCODONE-ACETAMINOPHEN 7.5-325 MG PO TABS
1.0000 | ORAL_TABLET | Freq: Four times a day (QID) | ORAL | 0 refills | Status: DC | PRN
Start: 2017-10-21 — End: 2017-12-23

## 2017-10-21 NOTE — Patient Instructions (Signed)
In a few days you may receive a survey in the mail or online from [employer] regarding your visit with [place] today. Please take a moment to fill this out. Your feedback is very important to our whole office. It can help [place] better understand your needs as well as improve your experience and satisfaction. Thank you for taking your time to complete it. We care about you.  Evelina Dun, FNP

## 2017-10-21 NOTE — Progress Notes (Signed)
Subjective:    Patient ID: [name], male    DOB: 19-Jan-1966, 51 y.o.   MRN: [id number]  Pt presents to the office today for chronic follow up. Pt is followed by neurologists annually for migraines and seizures.PT is followed by Ortho  for osteoarthritis every 3 months. Diabetes  He presents for his follow-up diabetic visit. He has type 2 diabetes mellitus. His disease course has been stable. There are no hypoglycemic associated symptoms. Associated symptoms include foot paresthesias and visual change. Pertinent negatives for diabetes include no blurred vision. Symptoms are worsening. Diabetic complications include heart disease. Pertinent negatives for diabetic complications include no CVA. Risk factors for coronary artery disease include diabetes mellitus, dyslipidemia, male sex, obesity, hypertension and sedentary lifestyle. He is following a generally unhealthy diet. His overall blood glucose range is >200 mg/dl. Eye exam is current.  Hyperlipidemia  This is a chronic problem. The current episode started more than 1 year ago. The problem is controlled. Recent lipid tests were reviewed and are normal. Exacerbating diseases include obesity. Associated symptoms include shortness of breath. Current antihyperlipidemic treatment includes statins. The current treatment provides moderate improvement of lipids. Risk factors for coronary artery disease include dyslipidemia, hypertension, male sex, obesity, a sedentary lifestyle, family history and diabetes mellitus.  Hypertension  This is a chronic problem. The current episode started more than 1 year ago. The problem has been waxing and waning since onset. The problem is controlled. Associated symptoms include peripheral edema and shortness of breath. Pertinent negatives include no blurred vision. Risk factors for coronary artery disease include diabetes mellitus, dyslipidemia, family history, male gender, obesity and sedentary lifestyle. The current  treatment provides moderate improvement. Hypertensive end-organ damage includes kidney disease and CAD/MI. There is no history of CVA.  Depression         This is a chronic problem.  The current episode started more than 1 year ago.   The onset quality is gradual.   The problem occurs intermittently.  The problem has been waxing and waning since onset.  Associated symptoms include helplessness, hopelessness, irritable, restlessness, decreased interest and sad.  Past treatments include SNRIs - Serotonin and norepinephrine reuptake inhibitors.     Review of Systems  Eyes: Negative for blurred vision.  Respiratory: Positive for shortness of breath.   Psychiatric/Behavioral: Positive for depression.  All other systems reviewed and are negative.      Objective:   Physical Exam  Constitutional: He is oriented to person, place, and time. He appears well-developed and well-nourished. He is irritable. No distress.  Morbid obese  HENT:  Head: Normocephalic.  Right Ear: External ear normal.  Left Ear: External ear normal.  Mouth/Throat: Oropharynx is clear and moist.  Eyes: Pupils are equal, round, and reactive to light. Right eye exhibits no discharge. Left eye exhibits no discharge.  Neck: Normal range of motion. Neck supple. No thyromegaly present.  Cardiovascular: Normal rate, regular rhythm, normal heart sounds and intact distal pulses.  No murmur heard. Pulmonary/Chest: Effort normal and breath sounds normal. No respiratory distress. He has no wheezes.  Abdominal: Soft. Bowel sounds are normal. He exhibits no distension. There is no tenderness.  Musculoskeletal: Normal range of motion. He exhibits edema (trace BLE). He exhibits no tenderness.  Decondition using electric wheelchair  Neurological: He is alert and oriented to person, place, and time.  Skin: Skin is warm and dry. No rash noted. No erythema.  Psychiatric: He has a normal mood and affect. His behavior is normal.  Judgment and  thought content normal.  Vitals reviewed.    BP 137/88   Pulse 78   Temp 99.2 F (37.3 C) (Oral)      Assessment & Plan:  1. Type 2 diabetes mellitus with hyperglycemia, with long-term current use of insulin (HCC) - Bayer DCA Hb A1c Waived - CMP14+EGFR - Ambulatory referral to Endocrinology  2. Hyperlipidemia associated with type 2 diabetes mellitus (HCC)  - CMP14+EGFR - Lipid panel  3. Morbid obesity ([place]) - CMP14+EGFR  4. History of pulmonary embolism - CMP14+EGFR - CoaguChek XS/INR Waived  5. Pain medication agreement signed  - CMP14+EGFR - HYDROcodone-acetaminophen (NORCO) 7.5-325 MG tablet; Take 1 tablet by mouth every 6 (six) hours as needed for moderate pain.  Dispense: 30 tablet; Refill: 0 - HYDROcodone-acetaminophen (NORCO) 7.5-325 MG tablet; Take 1 tablet by mouth every 6 (six) hours as needed for moderate pain. May fill June 4th, 2018 or after  Dispense: 30 tablet; Refill: 0 - HYDROcodone-acetaminophen (NORCO) 7.5-325 MG tablet; Take 1 tablet by mouth every 6 (six) hours as needed for moderate pain.  Dispense: 30 tablet; Refill: 0 - fentaNYL (DURAGESIC) 75 MCG/HR; Place 1 patch (75 mcg total) onto the skin every 3 (three) days.  Dispense: 10 patch; Refill: 0 - fentaNYL (DURAGESIC - DOSED MCG/HR) 75 MCG/HR; Place 1 patch (75 mcg total) onto the skin every 3 (three) days.  Dispense: 10 patch; Refill: 0 - fentaNYL (DURAGESIC) 75 MCG/HR; Place 1 patch (75 mcg total) onto the skin every 3 (three) days.  Dispense: 10 patch; Refill: 0  6. Uncomplicated opioid dependence ([place]) - CMP14+EGFR - HYDROcodone-acetaminophen (NORCO) 7.5-325 MG tablet; Take 1 tablet by mouth every 6 (six) hours as needed for moderate pain.  Dispense: 30 tablet; Refill: 0 - HYDROcodone-acetaminophen (NORCO) 7.5-325 MG tablet; Take 1 tablet by mouth every 6 (six) hours as needed for moderate pain. May fill June 4th, 2018 or after  Dispense: 30 tablet; Refill: 0 - HYDROcodone-acetaminophen (NORCO)  7.5-325 MG tablet; Take 1 tablet by mouth every 6 (six) hours as needed for moderate pain.  Dispense: 30 tablet; Refill: 0 - fentaNYL (DURAGESIC) 75 MCG/HR; Place 1 patch (75 mcg total) onto the skin every 3 (three) days.  Dispense: 10 patch; Refill: 0 - fentaNYL (DURAGESIC - DOSED MCG/HR) 75 MCG/HR; Place 1 patch (75 mcg total) onto the skin every 3 (three) days.  Dispense: 10 patch; Refill: 0 - fentaNYL (DURAGESIC) 75 MCG/HR; Place 1 patch (75 mcg total) onto the skin every 3 (three) days.  Dispense: 10 patch; Refill: 0  7. Moderate episode of recurrent major depressive disorder (HCC) - CMP14+EGFR  8. Seizure disorder ([place]) - CMP14+EGFR  9. Chronic anticoagulation - CMP14+EGFR - CoaguChek XS/INR Waived  10. Chronic pain syndrome - HYDROcodone-acetaminophen (NORCO) 7.5-325 MG tablet; Take 1 tablet by mouth every 6 (six) hours as needed for moderate pain.  Dispense: 30 tablet; Refill: 0 - HYDROcodone-acetaminophen (NORCO) 7.5-325 MG tablet; Take 1 tablet by mouth every 6 (six) hours as needed for moderate pain. May fill June 4th, 2018 or after  Dispense: 30 tablet; Refill: 0 - HYDROcodone-acetaminophen (NORCO) 7.5-325 MG tablet; Take 1 tablet by mouth every 6 (six) hours as needed for moderate pain.  Dispense: 30 tablet; Refill: 0 - fentaNYL (DURAGESIC) 75 MCG/HR; Place 1 patch (75 mcg total) onto the skin every 3 (three) days.  Dispense: 10 patch; Refill: 0 - fentaNYL (DURAGESIC - DOSED MCG/HR) 75 MCG/HR; Place 1 patch (75 mcg total) onto the skin every 3 (three) days.  Dispense: 10 patch; Refill: 0 - fentaNYL (DURAGESIC) 75 MCG/HR; Place 1 patch (75 mcg total) onto the skin every 3 (three) days.  Dispense: 10 patch; Refill: 0  11. Chronic bilateral low back pain with right-sided sciatica - HYDROcodone-acetaminophen (NORCO) 7.5-325 MG tablet; Take 1 tablet by mouth every 6 (six) hours as needed for moderate pain.  Dispense: 30 tablet; Refill: 0 - HYDROcodone-acetaminophen (NORCO) 7.5-325 MG  tablet; Take 1 tablet by mouth every 6 (six) hours as needed for moderate pain. May fill June 4th, 2018 or after  Dispense: 30 tablet; Refill: 0 - HYDROcodone-acetaminophen (NORCO) 7.5-325 MG tablet; Take 1 tablet by mouth every 6 (six) hours as needed for moderate pain.  Dispense: 30 tablet; Refill: 0 - fentaNYL (DURAGESIC) 75 MCG/HR; Place 1 patch (75 mcg total) onto the skin every 3 (three) days.  Dispense: 10 patch; Refill: 0 - fentaNYL (DURAGESIC - DOSED MCG/HR) 75 MCG/HR; Place 1 patch (75 mcg total) onto the skin every 3 (three) days.  Dispense: 10 patch; Refill: 0 - fentaNYL (DURAGESIC) 75 MCG/HR; Place 1 patch (75 mcg total) onto the skin every 3 (three) days.  Dispense: 10 patch; Refill: 0  12. Testosterone deficiency - Ambulatory referral to Endocrinology   Continue all meds Labs pending Health Maintenance reviewed Diet and exercise encouraged RTO 3 months   Evelina Dun, FNP

## 2017-10-22 LAB — LIPID PANEL
Chol/HDL Ratio: 2.8 ratio (ref 0.0–5.0)
Cholesterol, Total: 102 mg/dL (ref 100–199)
HDL: 37 mg/dL — ABNORMAL LOW (ref >39)
LDL Calculated: 34 mg/dL (ref 0–99)
Triglycerides: 154 mg/dL — ABNORMAL HIGH (ref 0–149)
VLDL Cholesterol Cal: 31 mg/dL (ref 5–40)

## 2017-10-22 LAB — CMP14+EGFR
ALT: 32 IU/L (ref 0–44)
AST: 52 IU/L — ABNORMAL HIGH (ref 0–40)
Albumin/Globulin Ratio: 0.8 — ABNORMAL LOW (ref 1.2–2.2)
Albumin: 3.2 g/dL — ABNORMAL LOW (ref 3.5–5.5)
Alkaline Phosphatase: 116 IU/L (ref 39–117)
BUN/Creatinine Ratio: 11 (ref 9–20)
BUN: 18 mg/dL (ref 6–24)
Bilirubin Total: 0.5 mg/dL (ref 0.0–1.2)
CO2: 27 mmol/L (ref 20–29)
Calcium: 8.7 mg/dL (ref 8.7–10.2)
Chloride: 93 mmol/L — ABNORMAL LOW (ref 96–106)
Creatinine, Ser: 1.69 mg/dL — ABNORMAL HIGH (ref 0.76–1.27)
GFR calc Af Amer: 53 mL/min/{1.73_m2} — ABNORMAL LOW (ref >59)
GFR calc non Af Amer: 46 mL/min/{1.73_m2} — ABNORMAL LOW (ref >59)
Globulin, Total: 3.9 g/dL (ref 1.5–4.5)
Glucose: 284 mg/dL — ABNORMAL HIGH (ref 65–99)
Potassium: 4 mmol/L (ref 3.5–5.2)
Sodium: 139 mmol/L (ref 134–144)
Total Protein: 7.1 g/dL (ref 6.0–8.5)

## 2017-10-26 ENCOUNTER — Other Ambulatory Visit: Payer: Self-pay | Admitting: Family

## 2017-11-03 ENCOUNTER — Ambulatory Visit (INDEPENDENT_AMBULATORY_CARE_PROVIDER_SITE_OTHER): Payer: Medicare Other | Admitting: Endocrinology

## 2017-11-03 ENCOUNTER — Encounter: Payer: Self-pay | Admitting: Endocrinology

## 2017-11-03 VITALS — BP 168/72 | HR 73 | Wt >= 6400 oz

## 2017-11-03 DIAGNOSIS — L97509 Non-pressure chronic ulcer of other part of unspecified foot with unspecified severity: Secondary | ICD-10-CM

## 2017-11-03 DIAGNOSIS — N401 Enlarged prostate with lower urinary tract symptoms: Secondary | ICD-10-CM | POA: Diagnosis not present

## 2017-11-03 DIAGNOSIS — E11621 Type 2 diabetes mellitus with foot ulcer: Secondary | ICD-10-CM | POA: Diagnosis not present

## 2017-11-03 DIAGNOSIS — E349 Endocrine disorder, unspecified: Secondary | ICD-10-CM

## 2017-11-03 LAB — TSH: TSH: 1.98 u[IU]/mL (ref 0.35–4.50)

## 2017-11-03 LAB — LUTEINIZING HORMONE: LH: 2.29 m[IU]/mL (ref 1.50–9.30)

## 2017-11-03 LAB — PSA: PSA: 0.05 ng/mL — ABNORMAL LOW (ref 0.10–4.00)

## 2017-11-03 LAB — T4, FREE: Free T4: 0.85 ng/dL (ref 0.60–1.60)

## 2017-11-03 MED ORDER — INSULIN GLARGINE 300 UNIT/ML ~~LOC~~ SOPN: 350.0000 [IU] | PEN_INJECTOR | Freq: Every day | SUBCUTANEOUS | 0 refills | Status: DC
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
## 2017-11-03 NOTE — Patient Instructions (Addendum)
good diet and exercise significantly improve the control of your diabetes.  please let me know if you wish to be referred to a dietician.  high blood sugar is very risky to your health.  you should see an eye doctor and dentist every year.  It is very important to get all recommended vaccinations.  Controlling your blood pressure and cholesterol drastically reduces the damage diabetes does to your body.  Those who smoke should quit.  Please discuss these with your doctor.  check your blood sugar twice a day.  vary the time of day when you check, between before the 3 meals, and at bedtime.  also check if you have symptoms of your blood sugar being too high or too low.  please keep a record of the readings and bring it to your next appointment here (or you can bring the meter itself).  You can write it on any piece of paper.  please call us sooner if your blood sugar goes below 70, or if you have a lot of readings over 200. blood tests are requested for you today.  We'll let you know about the results.   For now, please: Increase the toujeo to 350 units daily, and:  Please continue the same other diabetes medications. Please call or message [place] next week, to tell us how the blood sugar is doing.  Please come back for a follow-up appointment in 2-3 weeks.

## 2017-11-04 LAB — TESTOSTERONE,FREE AND TOTAL
Testosterone, Free: 1.1 pg/mL — ABNORMAL LOW (ref 7.2–24.0)
Testosterone: 26 ng/dL — ABNORMAL LOW (ref 264–916)

## 2017-11-04 LAB — PROLACTIN: Prolactin: 6.7 ng/mL (ref 2.0–18.0)

## 2017-11-06 ENCOUNTER — Other Ambulatory Visit: Payer: Self-pay | Admitting: Family

## 2017-11-10 DIAGNOSIS — J449 Chronic obstructive pulmonary disease, unspecified: Secondary | ICD-10-CM | POA: Diagnosis not present

## 2017-11-12 ENCOUNTER — Encounter: Payer: Self-pay | Admitting: *Deleted

## 2017-11-15 DIAGNOSIS — G894 Chronic pain syndrome: Secondary | ICD-10-CM | POA: Diagnosis not present

## 2017-11-15 DIAGNOSIS — M5441 Lumbago with sciatica, right side: Secondary | ICD-10-CM | POA: Diagnosis not present

## 2017-11-21 ENCOUNTER — Other Ambulatory Visit: Payer: Self-pay | Admitting: Family

## 2017-11-21 DIAGNOSIS — F32A Depression, unspecified: Secondary | ICD-10-CM

## 2017-11-21 DIAGNOSIS — F329 Major depressive disorder, single episode, unspecified: Secondary | ICD-10-CM

## 2017-11-23 NOTE — Telephone Encounter (Signed)
Last seen 10/21/17  Last Vit D 07/13/17  35.1

## 2017-12-01 ENCOUNTER — Ambulatory Visit (INDEPENDENT_AMBULATORY_CARE_PROVIDER_SITE_OTHER): Payer: Medicare Other | Admitting: Family

## 2017-12-01 ENCOUNTER — Encounter: Payer: Self-pay | Admitting: Family

## 2017-12-01 VITALS — BP 129/74 | HR 70 | Temp 98.6°F

## 2017-12-01 DIAGNOSIS — J810 Acute pulmonary edema: Secondary | ICD-10-CM | POA: Diagnosis not present

## 2017-12-01 DIAGNOSIS — Z7901 Long term (current) use of anticoagulants: Secondary | ICD-10-CM

## 2017-12-01 LAB — COAGUCHEK XS/INR WAIVED
INR: 2.8 — ABNORMAL HIGH (ref 0.9–1.1)
Prothrombin Time: 33.9 s

## 2017-12-01 NOTE — Progress Notes (Signed)
See anticoagulation flow sheet.

## 2017-12-03 ENCOUNTER — Ambulatory Visit (INDEPENDENT_AMBULATORY_CARE_PROVIDER_SITE_OTHER): Payer: Medicare Other | Admitting: Endocrinology

## 2017-12-03 ENCOUNTER — Ambulatory Visit: Payer: Medicare Other | Admitting: Family

## 2017-12-03 ENCOUNTER — Encounter: Payer: Self-pay | Admitting: Endocrinology

## 2017-12-03 ENCOUNTER — Encounter: Payer: Self-pay | Admitting: Dietician

## 2017-12-03 ENCOUNTER — Encounter: Payer: Medicare Other | Attending: Endocrinology | Admitting: Dietician

## 2017-12-03 VITALS — BP 142/78 | HR 72

## 2017-12-03 DIAGNOSIS — Z6841 Body Mass Index (BMI) 40.0 and over, adult: Secondary | ICD-10-CM | POA: Insufficient documentation

## 2017-12-03 DIAGNOSIS — E11621 Type 2 diabetes mellitus with foot ulcer: Secondary | ICD-10-CM

## 2017-12-03 DIAGNOSIS — L97509 Non-pressure chronic ulcer of other part of unspecified foot with unspecified severity: Secondary | ICD-10-CM | POA: Insufficient documentation

## 2017-12-03 DIAGNOSIS — Z713 Dietary counseling and surveillance: Secondary | ICD-10-CM | POA: Diagnosis not present

## 2017-12-03 DIAGNOSIS — E1165 Type 2 diabetes mellitus with hyperglycemia: Secondary | ICD-10-CM

## 2017-12-03 DIAGNOSIS — Z794 Long term (current) use of insulin: Secondary | ICD-10-CM

## 2017-12-03 LAB — POCT GLYCOSYLATED HEMOGLOBIN (HGB A1C): Hemoglobin A1C: 10.5 % — AB (ref 4.0–5.6)

## 2017-12-03 MED ORDER — INSULIN GLARGINE 300 UNIT/ML ~~LOC~~ SOPN
400.0000 [IU] | PEN_INJECTOR | Freq: Every day | SUBCUTANEOUS | 0 refills | Status: DC
Start: 2017-12-03 — End: 2018-02-10

## 2017-12-03 MED ORDER — CLOMIPHENE CITRATE 50 MG PO TABS: 25.0000 mg | ORAL_TABLET | ORAL | 11 refills | Status: DC

## 2017-12-03 NOTE — Patient Instructions (Signed)
Move more- consider armchair exercises.  Google armchair exercises.  Do these for 15 minutes daily to start and increase as able to 60 minutes per day.  Start to decrease the amount of carbohydrates at each meal. Eat lunch and give the Novolog prior to this meal Small snacks are fine when you are hungry Have a small amount of protein with each meal and snack (peanut butter, beans, 1-3 ounces meat, milk, or yogurt). Continue to avoid added salt.  Start to cook more from scratch to avoid processed foods that include a lot of sodium. Consider changing from instant oatmeal to regular oatmeal.  Aim for 4 Carb Choices per meal (60 grams) +/- 1 either way  Aim for 0-2 Carbs per snack if hungry  Include protein in moderation with your meals and snacks Consider reading food labels for Total Carbohydrate and Fat Grams of foods Consider  increasing your activity level by armchair for 15-60 minutes daily as tolerated Consider checking BG at alternate times per day as directed by MD  Consider taking medication as directed by MD

## 2017-12-03 NOTE — Patient Instructions (Addendum)
check your blood sugar twice a day.  vary the time of day when you check, between before the 3 meals, and at bedtime.  also check if you have symptoms of your blood sugar being too high or too low.  please keep a record of the readings and bring it to your next appointment here (or you can bring the meter itself).  You can write it on any piece of paper.  please call us sooner if your blood sugar goes below 70, or if you have a lot of readings over 200. Please increase the toujeo to 400 units daily, and:  Please continue the same other diabetes medications.  I have sent a prescription to your pharmacy, for the testosterone.   Please come back for a follow-up appointment in 2 months.

## 2017-12-03 NOTE — Progress Notes (Signed)
Diabetes Self-Management Education  Visit Type: First/Initial  Appt. Start Time: 1000 Appt. End Time: 1110  12/03/2017  [name], identified by name and date of birth, is a 52 y.o. male with a diagnosis of Diabetes: Type 2. Other history includes severe neuropathy, renal insufficiency (GFR 46), OSA on C-pap, bipolar, GERD, history of seizures.  Vitamin D 35 06/2017.  A1C has gone up today from 8.8% to 10.5%.    Medications include Toujeo 350 units per day (200 units each am and 150 units each HS), 35-50 units Humalog before meals (amount dependent on blood sugar readings).  Trulicity, and Jardiance.  Patient lives with his wife.  They are getting ready to move this month from an apartment to a home.  He is in a power chair due to obesity and stroke like symptoms following seizures.  His wife is a Geologist, engineering and does the cooking.  He states that he is a picky eater and eats no beef, fried food or fast food.  He skips lunch and does not take insulin although eats increased snacks during this time.    ASSESSMENT  Height 6\' 3"  (1.905 m). Body mass index is 56.55 kg/m.  Diabetes Self-Management Education - 12/03/17 1024      Visit Information   Visit Type  First/Initial      Initial Visit   Diabetes Type  Type 2    Are you currently following a meal plan?  No    Are you taking your medications as prescribed?  Yes    Date Diagnosed  2009      Health Coping   How would you rate your overall health?  Fair      Psychosocial Assessment   Patient Belief/Attitude about Diabetes  Motivated to manage diabetes    Self-care barriers  Debilitated state due to current medical condition    Self-management support  Doctor's office;Family    Other persons present  Patient;Spouse/SO    Patient Concerns  Nutrition/Meal planning;Glycemic Control;Weight Control;Healthy Lifestyle;Problem Solving    Special Needs  None    Preferred Learning Style  No preference indicated    Learning Readiness   Ready    How often do you need to have someone help you when you read instructions, pamphlets, or other written materials from your doctor or pharmacy?  1 - Never    What is the last grade level you completed in school?  2 years college      Pre-Education Assessment   Patient understands the diabetes disease and treatment process.  Needs Review    Patient understands incorporating nutritional management into lifestyle.  Needs Review    Patient undertands incorporating physical activity into lifestyle.  Needs Review    Patient understands using medications safely.  Needs Review    Patient understands monitoring blood glucose, interpreting and using results  Needs Review    Patient understands prevention, detection, and treatment of acute complications.  Needs Review    Patient understands prevention, detection, and treatment of chronic complications.  Needs Review    Patient understands how to develop strategies to address psychosocial issues.  Needs Review    Patient understands how to develop strategies to promote health/change behavior.  Needs Review      Complications   Last HgB A1C per patient/outside source  8.8 % 10/21/17    How often do you check your blood sugar?  3-4 times/day    Fasting Blood glucose range (mg/dL)  >200 was 130 in mid  April but now greater than 200    Number of hypoglycemic episodes per month  0    Number of hyperglycemic episodes per week  21    Can you tell when your blood sugar is high?  Yes sleepy, sluggish    What do you do if your blood sugar is high?  take insulin (Humalog)    Have you had a dilated eye exam in the past 12 months?  Yes    Have you had a dental exam in the past 12 months?  No no teeth    Are you checking your feet?  Yes    How many days per week are you checking your feet?  4      Dietary Intake   Breakfast  Instant oatmeal (maple and brown sugar (3-4 packs), 12 ounces 2% milk 9 am    Snack (morning)  none    Lunch  skips    Snack  (afternoon)  banana or lite greek yogurt or peanut butter granola bar or orange or grapes    Dinner  chicken (4-5 ounces), pasta with alfredo sauce or baked fries, corn or pintos or baked beans 5-9    Snack (evening)  similar to afternoon snacks or milk    Beverage(s)  G2 Gatorade, water, diet Mt. Dew, 2% milk (24 oz per day)      Exercise   Exercise Type  ADL's in the bed, chronic pain in his knees      Patient Education   Previous Diabetes Education  Yes (please comment) 2002 before diabetes    Disease state   Other (comment) review of insulin resistance    Nutrition management   Food label reading, portion sizes and measuring food.;Role of diet in the treatment of diabetes and the relationship between the three main macronutrients and blood glucose level;Meal timing in regards to the patients' current diabetes medication.;Meal options for control of blood glucose level and chronic complications.    Physical activity and exercise   Role of exercise on diabetes management, blood pressure control and cardiac health.;Helped patient identify appropriate exercises in relation to his/her diabetes, diabetes complications and other health issue.    Medications  Reviewed patients medication for diabetes, action, purpose, timing of dose and side effects.    Monitoring  Purpose and frequency of SMBG.;Daily foot exams;Yearly dilated eye exam;Identified appropriate SMBG and/or A1C goals.    Acute complications  Taught treatment of hypoglycemia - the 15 rule.    Chronic complications  Relationship between chronic complications and blood glucose control    Psychosocial adjustment  Worked with patient to identify barriers to care and solutions;Role of stress on diabetes;Identified and addressed patients feelings and concerns about diabetes      Individualized Goals (developed by patient)   Nutrition  General guidelines for healthy choices and portions discussed    Physical Activity  Exercise 5-7 days per  week;15 minutes per day    Medications  take my medication as prescribed    Monitoring   test my blood glucose as discussed    Reducing Risk  examine blood glucose patterns    Health Coping  ask for help with (comment) MD, RD, CDE      Post-Education Assessment   Patient understands the diabetes disease and treatment process.  Demonstrates understanding / competency    Patient understands incorporating nutritional management into lifestyle.  Needs Review    Patient undertands incorporating physical activity into lifestyle.  Demonstrates understanding / competency  Patient understands using medications safely.  Demonstrates understanding / competency    Patient understands monitoring blood glucose, interpreting and using results  Demonstrates understanding / competency    Patient understands prevention, detection, and treatment of acute complications.  Demonstrates understanding / competency    Patient understands prevention, detection, and treatment of chronic complications.  Demonstrates understanding / competency    Patient understands how to develop strategies to address psychosocial issues.  Needs Review    Patient understands how to develop strategies to promote health/change behavior.  Needs Review      Outcomes   Expected Outcomes  Demonstrated interest in learning. Expect positive outcomes    Future DMSE  4-6 wks    Program Status  Not Completed       Individualized Plan for Diabetes Self-Management Training:   Learning Objective:  Patient will have a greater understanding of diabetes self-management. Patient education plan is to attend individual and/or group sessions per assessed needs and concerns.   Plan:   Patient Instructions  Move more- consider armchair exercises.  Google armchair exercises.  Do these for 15 minutes daily to start and increase as able to 60 minutes per day.  Start to decrease the amount of carbohydrates at each meal. Eat lunch and give the Novolog  prior to this meal Small snacks are fine when you are hungry Have a small amount of protein with each meal and snack (peanut butter, beans, 1-3 ounces meat, milk, or yogurt). Continue to avoid added salt.  Start to cook more from scratch to avoid processed foods that include a lot of sodium. Consider changing from instant oatmeal to regular oatmeal.  Aim for 4 Carb Choices per meal (60 grams) +/- 1 either way  Aim for 0-2 Carbs per snack if hungry  Include protein in moderation with your meals and snacks Consider reading food labels for Total Carbohydrate and Fat Grams of foods Consider  increasing your activity level by armchair for 15-60 minutes daily as tolerated Consider checking BG at alternate times per day as directed by MD  Consider taking medication as directed by MD       Expected Outcomes:  Demonstrated interest in learning. Expect positive outcomes  Education material provided: ADA Diabetes: Your Take Control Guide, Food label handouts, Meal plan card, My Plate and Snack sheet  If problems or questions, patient to contact team via:  Phone  Future DSME appointment: 4-6 wks
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
## 2017-12-11 DIAGNOSIS — J449 Chronic obstructive pulmonary disease, unspecified: Secondary | ICD-10-CM | POA: Diagnosis not present

## 2017-12-14 ENCOUNTER — Other Ambulatory Visit: Payer: Self-pay | Admitting: Family

## 2017-12-15 DIAGNOSIS — M19012 Primary osteoarthritis, left shoulder: Secondary | ICD-10-CM | POA: Diagnosis not present

## 2017-12-15 DIAGNOSIS — M19011 Primary osteoarthritis, right shoulder: Secondary | ICD-10-CM | POA: Diagnosis not present

## 2017-12-20 ENCOUNTER — Other Ambulatory Visit: Payer: Self-pay | Admitting: *Deleted

## 2017-12-20 MED ORDER — SIMVASTATIN 20 MG PO TABS: ORAL_TABLET | ORAL | 1 refills | Status: DC

## 2017-12-20 MED ORDER — POTASSIUM CHLORIDE CRYS ER 10 MEQ PO TBCR: EXTENDED_RELEASE_TABLET | ORAL | 0 refills | Status: DC

## 2017-12-20 MED ORDER — TAMSULOSIN HCL 0.4 MG PO CAPS: ORAL_CAPSULE | ORAL | 0 refills | Status: DC

## 2017-12-21 MED ORDER — CYCLOBENZAPRINE HCL 10 MG PO TABS: ORAL_TABLET | ORAL | 3 refills | Status: DC

## 2017-12-22 ENCOUNTER — Telehealth: Payer: Self-pay | Admitting: Family

## 2017-12-22 DIAGNOSIS — G8929 Other chronic pain: Secondary | ICD-10-CM

## 2017-12-22 DIAGNOSIS — F1120 Opioid dependence, uncomplicated: Secondary | ICD-10-CM

## 2017-12-22 DIAGNOSIS — Z0289 Encounter for other administrative examinations: Secondary | ICD-10-CM

## 2017-12-22 DIAGNOSIS — G894 Chronic pain syndrome: Secondary | ICD-10-CM

## 2017-12-22 DIAGNOSIS — M5441 Lumbago with sciatica, right side: Secondary | ICD-10-CM

## 2017-12-22 NOTE — Telephone Encounter (Signed)
Lost connection

## 2017-12-23 MED ORDER — FENTANYL 75 MCG/HR TD PT72: 75.0000 ug | MEDICATED_PATCH | TRANSDERMAL | 0 refills | Status: DC

## 2017-12-23 MED ORDER — HYDROCODONE-ACETAMINOPHEN 7.5-325 MG PO TABS: 1.0000 | ORAL_TABLET | Freq: Four times a day (QID) | ORAL | 0 refills | Status: DC | PRN

## 2017-12-23 MED ORDER — HYDROCODONE-ACETAMINOPHEN 7.5-325 MG PO TABS
1.0000 | ORAL_TABLET | Freq: Four times a day (QID) | ORAL | 0 refills | Status: DC | PRN
Start: 2017-12-23 — End: 2017-12-23

## 2017-12-23 NOTE — Telephone Encounter (Signed)
Prescription sent to pharmacy.

## 2017-12-23 NOTE — Telephone Encounter (Signed)
Provider must fix

## 2017-12-31 ENCOUNTER — Other Ambulatory Visit: Payer: Self-pay | Admitting: Family

## 2017-12-31 ENCOUNTER — Other Ambulatory Visit: Payer: Self-pay | Admitting: Family Medicine

## 2017-12-31 DIAGNOSIS — E1165 Type 2 diabetes mellitus with hyperglycemia: Secondary | ICD-10-CM

## 2017-12-31 DIAGNOSIS — Z794 Long term (current) use of insulin: Principal | ICD-10-CM

## 2018-01-04 ENCOUNTER — Telehealth: Payer: Self-pay | Admitting: Family

## 2018-01-04 NOTE — Telephone Encounter (Signed)
TC to [facility] med has been cancelled out of their system [facility] was able to run Rx through their system LMOVM to patient

## 2018-01-07 ENCOUNTER — Ambulatory Visit: Payer: Medicare Other | Admitting: Dietician

## 2018-01-10 DIAGNOSIS — J449 Chronic obstructive pulmonary disease, unspecified: Secondary | ICD-10-CM | POA: Diagnosis not present

## 2018-01-21 ENCOUNTER — Encounter: Payer: Self-pay | Admitting: Family

## 2018-01-21 ENCOUNTER — Ambulatory Visit (INDEPENDENT_AMBULATORY_CARE_PROVIDER_SITE_OTHER): Payer: Medicare Other | Admitting: Family

## 2018-01-21 VITALS — BP 155/92 | HR 70 | Temp 100.2°F | Ht 75.0 in

## 2018-01-21 DIAGNOSIS — F1120 Opioid dependence, uncomplicated: Secondary | ICD-10-CM

## 2018-01-21 DIAGNOSIS — Z794 Long term (current) use of insulin: Secondary | ICD-10-CM

## 2018-01-21 DIAGNOSIS — M5441 Lumbago with sciatica, right side: Secondary | ICD-10-CM

## 2018-01-21 DIAGNOSIS — E1165 Type 2 diabetes mellitus with hyperglycemia: Secondary | ICD-10-CM

## 2018-01-21 DIAGNOSIS — J810 Acute pulmonary edema: Secondary | ICD-10-CM

## 2018-01-21 DIAGNOSIS — F331 Major depressive disorder, recurrent, moderate: Secondary | ICD-10-CM

## 2018-01-21 DIAGNOSIS — Z7901 Long term (current) use of anticoagulants: Secondary | ICD-10-CM | POA: Diagnosis not present

## 2018-01-21 DIAGNOSIS — E785 Hyperlipidemia, unspecified: Secondary | ICD-10-CM

## 2018-01-21 DIAGNOSIS — G894 Chronic pain syndrome: Secondary | ICD-10-CM | POA: Diagnosis not present

## 2018-01-21 DIAGNOSIS — G8929 Other chronic pain: Secondary | ICD-10-CM

## 2018-01-21 DIAGNOSIS — F411 Generalized anxiety disorder: Secondary | ICD-10-CM

## 2018-01-21 DIAGNOSIS — G43711 Chronic migraine without aura, intractable, with status migrainosus: Secondary | ICD-10-CM | POA: Diagnosis not present

## 2018-01-21 DIAGNOSIS — Z0289 Encounter for other administrative examinations: Secondary | ICD-10-CM

## 2018-01-21 DIAGNOSIS — E1169 Type 2 diabetes mellitus with other specified complication: Secondary | ICD-10-CM

## 2018-01-21 DIAGNOSIS — R509 Fever, unspecified: Secondary | ICD-10-CM

## 2018-01-21 LAB — COAGUCHEK XS/INR WAIVED
INR: 1.6 — ABNORMAL HIGH (ref 0.9–1.1)
Prothrombin Time: 19.7 s

## 2018-01-21 MED ORDER — HYDROCODONE-ACETAMINOPHEN 7.5-325 MG PO TABS: 1.0000 | ORAL_TABLET | Freq: Four times a day (QID) | ORAL | 0 refills | Status: DC | PRN

## 2018-01-21 MED ORDER — FENTANYL 75 MCG/HR TD PT72: 75.0000 ug | MEDICATED_PATCH | TRANSDERMAL | 0 refills | Status: DC

## 2018-01-21 MED ORDER — FLUTICASONE PROPIONATE 50 MCG/ACT NA SUSP: 1.0000 | Freq: Two times a day (BID) | NASAL | 5 refills | Status: DC | PRN

## 2018-01-21 NOTE — Patient Instructions (Signed)

## 2018-01-21 NOTE — Addendum Note (Signed)
Addended by: Evelina Dun A on: 01/21/2018 03:00 PM   Modules accepted: Orders

## 2018-01-21 NOTE — Progress Notes (Signed)
Subjective:    Patient ID: [name], male    DOB: 09/20/1965, 52 y.o.   MRN: [id number]  Chief Complaint  Patient presents with  . pain management    refills  . INR recheck  . Diabetes    three month recheck   Pt presents to the office today for chronic follow up. Pt is followed by neurologists annually for migraines and seizures.PT is followed by Ortho for osteoarthritis every 3 months. Endocrinologists every 2 months for uncontrolled DM.  Hypertension  This is a chronic problem. The current episode started more than 1 year ago. The problem has been waxing and waning since onset. The problem is uncontrolled. Associated symptoms include malaise/fatigue, peripheral edema and shortness of breath. Risk factors for coronary artery disease include dyslipidemia, diabetes mellitus, obesity and male gender. The current treatment provides mild improvement. Hypertensive end-organ damage includes kidney disease. There is no history of CVA or heart failure.  Hyperlipidemia  This is a chronic problem. The current episode started more than 1 year ago. The problem is controlled. Recent lipid tests were reviewed and are normal. Exacerbating diseases include obesity. Associated symptoms include shortness of breath. Current antihyperlipidemic treatment includes statins. The current treatment provides moderate improvement of lipids. Risk factors for coronary artery disease include dyslipidemia, diabetes mellitus, hypertension, male sex, obesity and a sedentary lifestyle.  Arthritis  Presents for follow-up visit. He complains of pain and stiffness. The symptoms have been stable. Affected locations include the right knee, left knee, right shoulder and left shoulder (back ). His pain is at a severity of 7/10.  Depression         This is a chronic problem.  The current episode started more than 1 year ago.   The onset quality is gradual.   The problem occurs intermittently.  Associated symptoms include  irritable, decreased interest and sad.  Associated symptoms include no helplessness and no hopelessness. HX DVT PT taking warfarin, see anticoagulation flowsheet.    Review of Systems  Constitutional: Positive for malaise/fatigue.  Respiratory: Positive for shortness of breath.   Musculoskeletal: Positive for arthritis and stiffness.  Psychiatric/Behavioral: Positive for depression.  All other systems reviewed and are negative.      Objective:   Physical Exam  Constitutional: He is oriented to person, place, and time. He appears well-developed and well-nourished. He is irritable. No distress.  Morbid obese  HENT:  Head: Normocephalic.  Right Ear: External ear normal.  Left Ear: External ear normal.  Mouth/Throat: Oropharynx is clear and moist.  Eyes: Pupils are equal, round, and reactive to light. Right eye exhibits no discharge. Left eye exhibits no discharge.  Neck: Normal range of motion. Neck supple. No thyromegaly present.  Cardiovascular: Normal rate, regular rhythm, normal heart sounds and intact distal pulses.  No murmur heard. Pulmonary/Chest: Effort normal and breath sounds normal. No respiratory distress. He has no wheezes.  Abdominal: Soft. Bowel sounds are normal. He exhibits no distension. There is no tenderness.  Musculoskeletal: He exhibits edema (trace BLE). He exhibits no tenderness.  Deconditioned, using electric wheelchair   Neurological: He is alert and oriented to person, place, and time. He has normal reflexes. No cranial nerve deficit.  Skin: Skin is warm and dry. No rash noted. No erythema.  Psychiatric: He has a normal mood and affect. His behavior is normal. Judgment and thought content normal.  Vitals reviewed.    BP (!) 155/92   Pulse 70   Temp 100.2 F (37.9 C) (Oral)  Ht 6' 3"  (1.905 m)   BMI 56.55 kg/m      Assessment & Plan:  [name] comes in today with chief complaint of pain management (refills); INR recheck; and Diabetes  (three month recheck)   Diagnosis and orders addressed:  1. Chronic anticoagulation - CMP14+EGFR - CBC with Differential/Platelet  2. Chronic migraine without aura, intractable, with status migrainosus - CMP14+EGFR - CBC with Differential/Platelet  3. Chronic pain syndrome - CMP14+EGFR - HYDROcodone-acetaminophen (NORCO) 7.5-325 MG tablet; Take 1 tablet by mouth every 6 (six) hours as needed for moderate pain.  Dispense: 30 tablet; Refill: 0 - HYDROcodone-acetaminophen (NORCO) 7.5-325 MG tablet; Take 1 tablet by mouth every 6 (six) hours as needed for moderate pain. May fill June 4th, 2018 or after  Dispense: 30 tablet; Refill: 0 - HYDROcodone-acetaminophen (NORCO) 7.5-325 MG tablet; Take 1 tablet by mouth every 6 (six) hours as needed for moderate pain.  Dispense: 30 tablet; Refill: 0 - fentaNYL (DURAGESIC) 75 MCG/HR; Place 1 patch (75 mcg total) onto the skin every 3 (three) days.  Dispense: 10 patch; Refill: 0 - fentaNYL (DURAGESIC - DOSED MCG/HR) 75 MCG/HR; Place 1 patch (75 mcg total) onto the skin every 3 (three) days.  Dispense: 10 patch; Refill: 0 - fentaNYL (DURAGESIC) 75 MCG/HR; Place 1 patch (75 mcg total) onto the skin every 3 (three) days.  Dispense: 10 patch; Refill: 0 - CBC with Differential/Platelet  4. Moderate episode of recurrent major depressive disorder (HCC) - CMP14+EGFR - CBC with Differential/Platelet  5. Type 2 diabetes mellitus with hyperglycemia, with long-term current use of insulin (HCC) - CMP14+EGFR - CBC with Differential/Platelet  6. GAD (generalized anxiety disorder) - CMP14+EGFR - CBC with Differential/Platelet  7. Hyperlipidemia associated with type 2 diabetes mellitus (HCC) - CMP14+EGFR - Lipid panel - CBC with Differential/Platelet  8. Morbid obesity ([place]) - CMP14+EGFR - CBC with Differential/Platelet  9. Uncomplicated opioid dependence ([place]) - CMP14+EGFR - HYDROcodone-acetaminophen (NORCO) 7.5-325 MG tablet; Take 1 tablet by mouth  every 6 (six) hours as needed for moderate pain.  Dispense: 30 tablet; Refill: 0 - HYDROcodone-acetaminophen (NORCO) 7.5-325 MG tablet; Take 1 tablet by mouth every 6 (six) hours as needed for moderate pain. May fill June 4th, 2018 or after  Dispense: 30 tablet; Refill: 0 - HYDROcodone-acetaminophen (NORCO) 7.5-325 MG tablet; Take 1 tablet by mouth every 6 (six) hours as needed for moderate pain.  Dispense: 30 tablet; Refill: 0 - fentaNYL (DURAGESIC) 75 MCG/HR; Place 1 patch (75 mcg total) onto the skin every 3 (three) days.  Dispense: 10 patch; Refill: 0 - fentaNYL (DURAGESIC - DOSED MCG/HR) 75 MCG/HR; Place 1 patch (75 mcg total) onto the skin every 3 (three) days.  Dispense: 10 patch; Refill: 0 - fentaNYL (DURAGESIC) 75 MCG/HR; Place 1 patch (75 mcg total) onto the skin every 3 (three) days.  Dispense: 10 patch; Refill: 0 - CBC with Differential/Platelet  10. Pain medication agreement signed - CMP14+EGFR - HYDROcodone-acetaminophen (NORCO) 7.5-325 MG tablet; Take 1 tablet by mouth every 6 (six) hours as needed for moderate pain.  Dispense: 30 tablet; Refill: 0 - HYDROcodone-acetaminophen (NORCO) 7.5-325 MG tablet; Take 1 tablet by mouth every 6 (six) hours as needed for moderate pain. May fill June 4th, 2018 or after  Dispense: 30 tablet; Refill: 0 - HYDROcodone-acetaminophen (NORCO) 7.5-325 MG tablet; Take 1 tablet by mouth every 6 (six) hours as needed for moderate pain.  Dispense: 30 tablet; Refill: 0 - fentaNYL (DURAGESIC) 75 MCG/HR; Place 1 patch (75 mcg total) onto  the skin every 3 (three) days.  Dispense: 10 patch; Refill: 0 - fentaNYL (DURAGESIC - DOSED MCG/HR) 75 MCG/HR; Place 1 patch (75 mcg total) onto the skin every 3 (three) days.  Dispense: 10 patch; Refill: 0 - fentaNYL (DURAGESIC) 75 MCG/HR; Place 1 patch (75 mcg total) onto the skin every 3 (three) days.  Dispense: 10 patch; Refill: 0 - CBC with Differential/Platelet  11. Postoperative pulmonary edema (HCC) - CMP14+EGFR - CBC  with Differential/Platelet  12. Chronic bilateral low back pain with right-sided sciatica - CMP14+EGFR - HYDROcodone-acetaminophen (NORCO) 7.5-325 MG tablet; Take 1 tablet by mouth every 6 (six) hours as needed for moderate pain.  Dispense: 30 tablet; Refill: 0 - HYDROcodone-acetaminophen (NORCO) 7.5-325 MG tablet; Take 1 tablet by mouth every 6 (six) hours as needed for moderate pain. May fill June 4th, 2018 or after  Dispense: 30 tablet; Refill: 0 - HYDROcodone-acetaminophen (NORCO) 7.5-325 MG tablet; Take 1 tablet by mouth every 6 (six) hours as needed for moderate pain.  Dispense: 30 tablet; Refill: 0 - fentaNYL (DURAGESIC) 75 MCG/HR; Place 1 patch (75 mcg total) onto the skin every 3 (three) days.  Dispense: 10 patch; Refill: 0 - fentaNYL (DURAGESIC - DOSED MCG/HR) 75 MCG/HR; Place 1 patch (75 mcg total) onto the skin every 3 (three) days.  Dispense: 10 patch; Refill: 0 - fentaNYL (DURAGESIC) 75 MCG/HR; Place 1 patch (75 mcg total) onto the skin every 3 (three) days.  Dispense: 10 patch; Refill: 0 - CBC with Differential/Platelet  13. Fever, unspecified fever cause - Urinalysis, Complete   Labs pending Health Maintenance reviewed Diet and exercise encouraged  Follow up plan: 3 months    Evelina Dun, FNP

## 2018-01-22 LAB — CBC WITH DIFFERENTIAL/PLATELET
Basophils Absolute: 0.1 10*3/uL (ref 0.0–0.2)
Basos: 1 %
EOS (ABSOLUTE): 0.2 10*3/uL (ref 0.0–0.4)
Eos: 3 %
Hematocrit: 44.9 % (ref 37.5–51.0)
Hemoglobin: 14.1 g/dL (ref 13.0–17.7)
Immature Grans (Abs): 0 10*3/uL (ref 0.0–0.1)
Immature Granulocytes: 0 %
Lymphocytes Absolute: 1.5 10*3/uL (ref 0.7–3.1)
Lymphs: 30 %
MCH: 27.4 pg (ref 26.6–33.0)
MCHC: 31.4 g/dL — ABNORMAL LOW (ref 31.5–35.7)
MCV: 87 fL (ref 79–97)
Monocytes Absolute: 0.3 10*3/uL (ref 0.1–0.9)
Monocytes: 7 %
Neutrophils Absolute: 3 10*3/uL (ref 1.4–7.0)
Neutrophils: 59 %
Platelets: 117 10*3/uL — ABNORMAL LOW (ref 150–450)
RBC: 5.15 x10E6/uL (ref 4.14–5.80)
RDW: 15.9 % — ABNORMAL HIGH (ref 12.3–15.4)
WBC: 5.1 10*3/uL (ref 3.4–10.8)

## 2018-01-22 LAB — CMP14+EGFR
ALT: 22 IU/L (ref 0–44)
AST: 34 IU/L (ref 0–40)
Albumin/Globulin Ratio: 0.8 — ABNORMAL LOW (ref 1.2–2.2)
Albumin: 3.1 g/dL — ABNORMAL LOW (ref 3.5–5.5)
Alkaline Phosphatase: 91 IU/L (ref 39–117)
BUN/Creatinine Ratio: 19 (ref 9–20)
BUN: 30 mg/dL — ABNORMAL HIGH (ref 6–24)
Bilirubin Total: 0.4 mg/dL (ref 0.0–1.2)
CO2: 22 mmol/L (ref 20–29)
Calcium: 8.4 mg/dL — ABNORMAL LOW (ref 8.7–10.2)
Chloride: 102 mmol/L (ref 96–106)
Creatinine, Ser: 1.54 mg/dL — ABNORMAL HIGH (ref 0.76–1.27)
GFR calc Af Amer: 60 mL/min/{1.73_m2} (ref >59)
GFR calc non Af Amer: 51 mL/min/{1.73_m2} — ABNORMAL LOW (ref >59)
Globulin, Total: 3.7 g/dL (ref 1.5–4.5)
Glucose: 282 mg/dL — ABNORMAL HIGH (ref 65–99)
Potassium: 4.7 mmol/L (ref 3.5–5.2)
Sodium: 138 mmol/L (ref 134–144)
Total Protein: 6.8 g/dL (ref 6.0–8.5)

## 2018-01-22 LAB — LIPID PANEL
Chol/HDL Ratio: 2.5 ratio (ref 0.0–5.0)
Cholesterol, Total: 121 mg/dL (ref 100–199)
HDL: 48 mg/dL (ref >39)
LDL Calculated: 44 mg/dL (ref 0–99)
Triglycerides: 143 mg/dL (ref 0–149)
VLDL Cholesterol Cal: 29 mg/dL (ref 5–40)

## 2018-01-25 ENCOUNTER — Other Ambulatory Visit: Payer: Self-pay | Admitting: Family

## 2018-01-25 DIAGNOSIS — D696 Thrombocytopenia, unspecified: Secondary | ICD-10-CM

## 2018-01-26 ENCOUNTER — Encounter: Payer: Self-pay | Admitting: Family

## 2018-01-26 ENCOUNTER — Ambulatory Visit (INDEPENDENT_AMBULATORY_CARE_PROVIDER_SITE_OTHER): Payer: Medicare Other | Admitting: Family

## 2018-01-26 VITALS — BP 118/81 | HR 88 | Temp 97.6°F | Ht 75.0 in

## 2018-01-26 DIAGNOSIS — B079 Viral wart, unspecified: Secondary | ICD-10-CM

## 2018-01-26 DIAGNOSIS — L918 Other hypertrophic disorders of the skin: Secondary | ICD-10-CM | POA: Diagnosis not present

## 2018-01-26 NOTE — Patient Instructions (Signed)
Skin Tag, Adult A skin tag (acrochordon) is a soft, extra growth of skin. Most skin tags are flesh-colored and rarely bigger than a pencil eraser. They commonly form near areas where there are folds in the skin, such as the armpit or groin. Skin tags are not dangerous, and they do not spread from person to person (are not contagious). You may have one skin tag or several. Skin tags do not require treatment. However, your health care provider may recommend removal of a skin tag if it:  Gets irritated from clothing.  Bleeds.  Is visible and unsightly.  Your health care provider can remove skin tags with a simple surgical procedure or a procedure that involves freezing the skin tag. Follow these instructions at home:  Watch for any changes in your skin tag. A normal skin tag does not require any other special care at home.  Take over-the-counter and prescription medicines only as told by your health care provider.  Keep all follow-up visits as told by your health care provider. This is important. Contact a health care provider if:  You have a skin tag that: ? Becomes painful. ? Changes color. ? Bleeds. ? Swells.  You develop more skin tags. This information is not intended to replace advice given to you by your health care provider. Make sure you discuss any questions you have with your health care provider. Document Released: 06/30/2015 Document Revised: 02/09/2016 Document Reviewed: 06/30/2015 Elsevier Interactive Patient Education  2018 Elsevier Inc.  

## 2018-01-26 NOTE — Progress Notes (Signed)
   Subjective:    Patient ID: [name], male    DOB: [birth year], 52 y.o.   MRN: [id number]  Chief Complaint  Patient presents with  . remove skin tags    HPI PT presents to the office today to have skin tags removed around neck. Pt states these have been here 10 years, but are getting "hung on my shirt". Denies any pain or color changes.    Review of Systems  All other systems reviewed and are negative.      Objective:   Physical Exam  Constitutional: He is oriented to person, place, and time. He appears well-developed and well-nourished. No distress.  morbid obese  HENT:  Head: Normocephalic.  Neck: Normal range of motion. Neck supple. No thyromegaly present.  Cardiovascular: Normal rate, regular rhythm, normal heart sounds and intact distal pulses.  No murmur heard. Pulmonary/Chest: Effort normal and breath sounds normal. No respiratory distress. He has no wheezes.  Abdominal: Soft. Bowel sounds are normal. He exhibits no distension. There is no tenderness.  Neurological: He is alert and oriented to person, place, and time. He has normal reflexes. No cranial nerve deficit.  Skin: Skin is warm and dry. No rash noted. No erythema.  Multiple skin tags around neck, lower back, and left axillary   Psychiatric: He has a normal mood and affect. His behavior is normal. Judgment and thought content normal.  Vitals reviewed.   23 skin tags removed around neck, lower back, and left axillary.   Cryotherapy used on verruca on left posterior neck.   BP (!) 135/96   Pulse 79   Temp 97.6 F (36.4 C) (Oral)   Ht 6\' 3"  (1.905 m)   BMI 56.55 kg/m      Assessment & Plan:  Eustacio was seen today for remove skin tags.  Diagnoses and all orders for this visit:  Skin tag  Viral warts, unspecified type   Do not pick Report any s/s of infection RTO as needed and keep chronic follow up   Evelina Dun, FNP

## 2018-01-28 ENCOUNTER — Other Ambulatory Visit: Payer: Self-pay | Admitting: Family

## 2018-01-29 ENCOUNTER — Other Ambulatory Visit: Payer: Self-pay | Admitting: Family

## 2018-02-01 ENCOUNTER — Telehealth: Payer: Self-pay

## 2018-02-02 ENCOUNTER — Other Ambulatory Visit: Payer: Self-pay | Admitting: *Deleted

## 2018-02-02 MED ORDER — DULOXETINE HCL 60 MG PO CPEP: ORAL_CAPSULE | ORAL | 0 refills | Status: DC

## 2018-02-02 MED ORDER — VITAMIN D (ERGOCALCIFEROL) 1.25 MG (50000 UNIT) PO CAPS: 50000.0000 [IU] | ORAL_CAPSULE | ORAL | 0 refills | Status: DC

## 2018-02-03 NOTE — Telephone Encounter (Signed)
x

## 2018-02-09 ENCOUNTER — Other Ambulatory Visit: Payer: Self-pay

## 2018-02-09 NOTE — Patient Outreach (Signed)
[facility]) Care Management  02/09/2018  [name] 06/13/1966 [id number]   Medication Adherence call to [name] left a message for patient to call back patient is due on Simvastatin 20 mg. [name] is showing past due under [place].   [facility] Management Direct Dial [phone]  Fax [phone] [name]

## 2018-02-10 ENCOUNTER — Other Ambulatory Visit: Payer: Self-pay | Admitting: Endocrinology

## 2018-02-10 DIAGNOSIS — J449 Chronic obstructive pulmonary disease, unspecified: Secondary | ICD-10-CM | POA: Diagnosis not present

## 2018-02-13 ENCOUNTER — Other Ambulatory Visit: Payer: Self-pay | Admitting: Family

## 2018-02-14 DIAGNOSIS — M5431 Sciatica, right side: Secondary | ICD-10-CM | POA: Diagnosis not present

## 2018-02-18 ENCOUNTER — Ambulatory Visit: Payer: Medicare Other | Admitting: Endocrinology

## 2018-02-25 ENCOUNTER — Encounter: Payer: Medicare Other | Admitting: Pharmacist Clinician (PhC)/ Clinical Pharmacy Specialist

## 2018-02-26 ENCOUNTER — Other Ambulatory Visit: Payer: Self-pay | Admitting: Family

## 2018-03-01 NOTE — Telephone Encounter (Signed)
OV 03/09/18

## 2018-03-02 ENCOUNTER — Encounter: Payer: Medicare Other | Admitting: Pharmacist Clinician (PhC)/ Clinical Pharmacy Specialist

## 2018-03-04 ENCOUNTER — Other Ambulatory Visit: Payer: Self-pay | Admitting: Orthopedic Surgery

## 2018-03-04 DIAGNOSIS — M545 Low back pain: Secondary | ICD-10-CM

## 2018-03-04 DIAGNOSIS — M5431 Sciatica, right side: Secondary | ICD-10-CM | POA: Diagnosis not present

## 2018-03-05 ENCOUNTER — Other Ambulatory Visit: Payer: Self-pay | Admitting: Family

## 2018-03-09 ENCOUNTER — Encounter: Payer: Medicare Other | Admitting: Pharmacist Clinician (PhC)/ Clinical Pharmacy Specialist

## 2018-03-10 ENCOUNTER — Other Ambulatory Visit: Payer: Self-pay

## 2018-03-10 NOTE — Patient Outreach (Signed)
[facility]) Care Management  03/10/2018  ERROL ALA 1965/12/16 [id number]   Medication Adherence call to Mr. Kyley Laurel spoke with patient's wife she said he still has plenty of simvastatin 20 mg and does not need any at this time patient takes one tablet every day and have  a pill box  Mr. Crom is showing past due under [place].  [place] [facility] Management Direct Dial [phone]  Fax [phone] [email or web address]

## 2018-03-12 ENCOUNTER — Other Ambulatory Visit: Payer: Self-pay | Admitting: Family

## 2018-03-13 DIAGNOSIS — J449 Chronic obstructive pulmonary disease, unspecified: Secondary | ICD-10-CM | POA: Diagnosis not present

## 2018-03-14 ENCOUNTER — Ambulatory Visit
Admission: RE | Admit: 2018-03-14 | Discharge: 2018-03-14 | Disposition: A | Discharge status: Home / Self Care | Payer: Medicare Other | Source: Ambulatory Visit | Attending: Orthopedic Surgery | Admitting: Orthopedic Surgery

## 2018-03-14 DIAGNOSIS — M545 Low back pain: Secondary | ICD-10-CM | POA: Diagnosis not present

## 2018-03-15 ENCOUNTER — Ambulatory Visit: Payer: Medicare Other | Admitting: Endocrinology

## 2018-03-16 ENCOUNTER — Other Ambulatory Visit: Payer: Self-pay | Admitting: Family

## 2018-03-16 DIAGNOSIS — M19012 Primary osteoarthritis, left shoulder: Secondary | ICD-10-CM | POA: Diagnosis not present

## 2018-03-16 DIAGNOSIS — M5431 Sciatica, right side: Secondary | ICD-10-CM | POA: Diagnosis not present

## 2018-03-16 DIAGNOSIS — M19011 Primary osteoarthritis, right shoulder: Secondary | ICD-10-CM | POA: Diagnosis not present

## 2018-03-21 DIAGNOSIS — M1380 Other specified arthritis, unspecified site: Secondary | ICD-10-CM | POA: Diagnosis not present

## 2018-03-21 DIAGNOSIS — G890 Central pain syndrome: Secondary | ICD-10-CM | POA: Diagnosis not present

## 2018-03-25 ENCOUNTER — Other Ambulatory Visit: Payer: Self-pay | Admitting: Family

## 2018-03-29 DIAGNOSIS — M5416 Radiculopathy, lumbar region: Secondary | ICD-10-CM | POA: Diagnosis not present

## 2018-04-04 ENCOUNTER — Other Ambulatory Visit (HOSPITAL_COMMUNITY): Payer: Self-pay | Admitting: Neurosurgery

## 2018-04-04 DIAGNOSIS — M5416 Radiculopathy, lumbar region: Secondary | ICD-10-CM

## 2018-04-07 ENCOUNTER — Encounter: Payer: Self-pay | Admitting: Family

## 2018-04-07 ENCOUNTER — Ambulatory Visit (INDEPENDENT_AMBULATORY_CARE_PROVIDER_SITE_OTHER): Payer: Medicare Other | Admitting: Family

## 2018-04-07 VITALS — BP 129/79 | HR 78 | Temp 98.0°F

## 2018-04-07 DIAGNOSIS — Z86711 Personal history of pulmonary embolism: Secondary | ICD-10-CM

## 2018-04-07 DIAGNOSIS — Z7901 Long term (current) use of anticoagulants: Secondary | ICD-10-CM | POA: Diagnosis not present

## 2018-04-07 DIAGNOSIS — I824Y9 Acute embolism and thrombosis of unspecified deep veins of unspecified proximal lower extremity: Secondary | ICD-10-CM | POA: Diagnosis not present

## 2018-04-07 DIAGNOSIS — J810 Acute pulmonary edema: Secondary | ICD-10-CM

## 2018-04-07 NOTE — Patient Instructions (Signed)
Bleeding Precautions When on Anticoagulant Therapy  WHAT IS ANTICOAGULANT THERAPY?  Anticoagulant therapy is taking medicine to prevent or reduce blood clots. It is also called blood thinner therapy. Blood clots that form in your blood vessels can be dangerous. They can break loose and travel to your heart, lungs, or brain. This increases your risk of a heart attack or stroke. Anticoagulant therapy causes blood to clot more slowly.  You may need anticoagulant therapy if you have:   A medical condition that increases the likelihood that blood clots will form.   A heart defect or a problem with heart rhythm.  It is also a common treatment after heart surgery, such as valve replacement.  WHAT ARE COMMON TYPES OF ANTICOAGULANT THERAPY?  Anticoagulant medicine can be injected or taken by mouth.If you need anticoagulant therapy quickly at the hospital, the medicine may be injected under your skin or given through an IV tube. Heparin is a common example of an anticoagulant that you may get at the hospital.  Most anticoagulant therapy is in the form of pills that you take at home every day. These may include:   Aspirin. This common blood thinner works by preventing blood cells (platelets) from sticking together to form a clot. Aspirin is not as strong as anticoagulants that slow down the time that it takes for your body to form a clot.   Clopidogrel. This is a newer type of drug that affects platelets. It is stronger than aspirin.   Warfarin. This is the most common anticoagulant. It changes the way your body uses vitamin K, a vitamin that helps your blood to clot. The risk of bleeding is higher with warfarin than with aspirin. You will need frequent blood tests to make sure you are taking the safest amount.   New anticoagulants. Several new drugs have been approved. They are all taken by mouth. Studies show that these drugs work as well as warfarin. They do not require blood testing. They may cause less bleeding  risk than warfarin.  WHAT DO I NEED TO REMEMBER WHEN TAKING ANTICOAGULANT THERAPY?  Anticoagulant therapy decreases your risk of forming a blood clot, but it increases your risk of bleeding. Work closely with your health care provider to make sure you are taking your medicine safely. These tips can help:   Learn ways to reduce your risk of bleeding.   If you are taking warfarin:  ? Have blood tests as ordered by your health care provider.  ? Do not make any sudden changes to your diet. Vitamin K in your diet can make warfarin less effective.  ? Do not get pregnant. This medicine may cause birth defects.   Take your medicine at the same time every day. If you forget to take your medicine, take it as soon as you remember. If you miss a whole day, do not double your dose of medicine. Take your normal dose and call your health care provider to check in.   Do not stop taking your medicine on your own.   Tell your health care provider before you start taking any new medicine, vitamin, or herbal product. Some of these could interfere with your therapy.   Tell all of your health care providers that you are on anticoagulant therapy.   Do not have surgery, medical procedures, or dental work until you tell your health care provider that you are on anticoagulant therapy.  WHAT CAN AFFECT HOW ANTICOAGULANTS WORK?  Certain foods, vitamins, medicines, supplements, and herbal   medicines change the way that anticoagulant therapy works. They may increase or decrease the effects of your anticoagulant therapy. Either result can be dangerous for you.   Many over-the-counter medicines for pain, colds, or stomach problems interfere with anticoagulant therapy. Take these only as told by your health care provider.   Do not drink alcohol. It can interfere with your medicine and increase your risk of an injury that causes bleeding.   If you are taking warfarin, do not begin eating more foods that contain vitamin K. These include  leafy green vegetables. Ask your health care provider if you should avoid any foods.  WHAT ARE SOME WAYS TO PREVENT BLEEDING?  You can prevent bleeding by taking certain precautions:   Be extra careful when you use knives, scissors, or other sharp objects.   Use an electric razor instead of a blade.   Do not use toothpicks.   Use a soft toothbrush.   Wear shoes that have nonskid soles.   Use bath mats and handrails in your bathroom.   Wear gloves while you do yard work.   Wear a helmet when you ride a bike.   Wear your seat belt.   Prevent falls by removing loose rugs and extension cords from areas where you walk.   Do not play contact sports or participate in other activities that have a high risk of injury.  WHEN SHOULD I CONTACT MY HEALTH CARE PROVIDER?  Call your health care provider if:   You miss a dose of medicine:  ? And you are not sure what to do.  ? For more than one day.   You have:  ? Menstrual bleeding that is heavier than normal.  ? Blood in your urine.  ? A bloody nose or bleeding gums.  ? Easy bruising.  ? Blood in your stool (feces) or have black and tarry stool.  ? Side effects from your medicine.   You feel weak or dizzy.   You become pregnant.  Seek immediate medical care if:   You have bleeding that will not stop.   You have sudden and severe headache or belly pain.   You vomit or you cough up bright red blood.   You have a severe blow to your head.  WHAT ARE SOME QUESTIONS TO ASK MY HEALTH CARE PROVIDER?   What is the best anticoagulant therapy for my condition?   What side effects should I watch for?   When should I take my medicine? What should I do if I forget to take it?   Will I need to have regular blood tests?   Do I need to change my diet? Are there foods or drinks that I should avoid?   What activities are safe for me?   What should I do if I want to get pregnant?  This information is not intended to replace advice given to you by your health care provider.  Make sure you discuss any questions you have with your health care provider.  Document Released: 05/27/2015 Document Reviewed: 05/27/2015  Elsevier Interactive Patient Education  2017 Elsevier Inc.

## 2018-04-07 NOTE — Progress Notes (Signed)
   Subjective:    Patient ID: NYLES MITTON, male    DOB: [birth year], 52 y.o.   MRN: [id number]  Chief Complaint  Patient presents with  . INR recheck    HPI Pt presents to the office today for INR check. He is currently talking warfarin 5 mg daily for hx DVT and PE. He is scheduled to have a nerve block on 04/11/18 and will be stopping his warfarin today, (four days prior to procedure).   His INR is 3.2 today.    Review of Systems  Musculoskeletal: Positive for arthralgias, back pain and myalgias.  All other systems reviewed and are negative.      Objective:   Physical Exam  Constitutional: He appears well-developed and well-nourished.  Cardiovascular: Normal rate, regular rhythm and normal heart sounds.  Pulmonary/Chest: Effort normal and breath sounds normal. No respiratory distress.  Musculoskeletal: He exhibits tenderness.  In wheelchair, pain in lumbar with with flexion and extension        BP 129/79   Pulse 78   Temp 98 F (36.7 C) (Oral)      Assessment & Plan:  ROSHARD REZABEK comes in today with chief complaint of INR recheck   Diagnosis and orders addressed:  1. Chronic anticoagulation  2. History of pulmonary embolism  3. Morbid obesity ([place])  4. Postoperative pulmonary edema (HCC)  5. Deep vein thrombosis (DVT) of proximal lower extremity, unspecified chronicity, unspecified laterality ([place])  Description   Start taking dose at 1 tablet (5mg ) daily  INR today was 3.2 and your goal is 2.0-3.0  We will not change since you are stopping warfarin for next 4 days.   Return in 4 weeks       Evelina Dun, [address]

## 2018-04-08 ENCOUNTER — Other Ambulatory Visit: Payer: Self-pay | Admitting: Student

## 2018-04-08 ENCOUNTER — Other Ambulatory Visit: Payer: Self-pay | Admitting: Physician Assistant

## 2018-04-11 ENCOUNTER — Ambulatory Visit (HOSPITAL_COMMUNITY)
Admission: RE | Admit: 2018-04-11 | Discharge: 2018-04-11 | Disposition: A | Discharge status: Home / Self Care | Payer: Medicare Other | Source: Ambulatory Visit | Attending: Neurosurgery | Admitting: Neurosurgery

## 2018-04-11 ENCOUNTER — Encounter (HOSPITAL_COMMUNITY): Payer: Self-pay | Admitting: Interventional Radiology

## 2018-04-11 DIAGNOSIS — M5126 Other intervertebral disc displacement, lumbar region: Secondary | ICD-10-CM | POA: Insufficient documentation

## 2018-04-11 DIAGNOSIS — M545 Low back pain: Secondary | ICD-10-CM | POA: Diagnosis not present

## 2018-04-11 DIAGNOSIS — M5416 Radiculopathy, lumbar region: Secondary | ICD-10-CM

## 2018-04-11 HISTORY — PX: IR FL GUIDED LOC OF NEEDLE/CATH TIP FOR SPINAL INJECTION RT: IMG2397

## 2018-04-11 MED ORDER — LIDOCAINE HCL (PF) 1 % IJ SOLN
INTRAMUSCULAR | Status: AC
  Administered 2018-04-11: 10 mL
  Filled 2018-04-11: qty 10

## 2018-04-11 MED ORDER — METHYLPREDNISOLONE ACETATE 80 MG/ML IJ SUSP
INTRAMUSCULAR | Status: AC
  Administered 2018-04-11: 80 mg
  Filled 2018-04-11: qty 1

## 2018-04-11 MED ORDER — METHYLPREDNISOLONE ACETATE 40 MG/ML IJ SUSP
INTRAMUSCULAR | Status: AC
  Administered 2018-04-11: 40 mg
  Filled 2018-04-11: qty 1

## 2018-04-11 MED ORDER — CHLORHEXIDINE GLUCONATE 4 % EX LIQD
CUTANEOUS | Status: AC
  Filled 2018-04-11: qty 15

## 2018-04-11 MED ORDER — IOPAMIDOL (ISOVUE-M 200) INJECTION 41%
INTRAMUSCULAR | Status: AC
  Administered 2018-04-11: 1 mL
  Filled 2018-04-11: qty 10

## 2018-04-11 MED ORDER — SODIUM CHLORIDE 0.9 % IJ SOLN
INTRAMUSCULAR | Status: AC
  Filled 2018-04-11: qty 10

## 2018-04-11 MED ORDER — HYDROCODONE-ACETAMINOPHEN 5-325 MG PO TABS: 1.0000 | ORAL_TABLET | ORAL | Status: DC | PRN

## 2018-04-11 NOTE — Procedures (Signed)
  Procedure: R L3-4 NRB/TFE 120mg  depomedrol EBL:   minimal Complications:  none immediate  See full dictation in [facility].  Dillard Cannon MD Main # [phone] Pager  [phone]

## 2018-04-12 DIAGNOSIS — J449 Chronic obstructive pulmonary disease, unspecified: Secondary | ICD-10-CM | POA: Diagnosis not present

## 2018-04-20 ENCOUNTER — Encounter: Payer: Self-pay | Admitting: Family

## 2018-04-20 ENCOUNTER — Ambulatory Visit (INDEPENDENT_AMBULATORY_CARE_PROVIDER_SITE_OTHER): Payer: Medicare Other | Admitting: Family

## 2018-04-20 VITALS — BP 138/89 | HR 71 | Temp 99.3°F | Ht 73.0 in

## 2018-04-20 DIAGNOSIS — Z794 Long term (current) use of insulin: Secondary | ICD-10-CM | POA: Diagnosis not present

## 2018-04-20 DIAGNOSIS — I824Y9 Acute embolism and thrombosis of unspecified deep veins of unspecified proximal lower extremity: Secondary | ICD-10-CM

## 2018-04-20 DIAGNOSIS — E1165 Type 2 diabetes mellitus with hyperglycemia: Secondary | ICD-10-CM

## 2018-04-20 DIAGNOSIS — M5441 Lumbago with sciatica, right side: Secondary | ICD-10-CM

## 2018-04-20 DIAGNOSIS — E785 Hyperlipidemia, unspecified: Secondary | ICD-10-CM

## 2018-04-20 DIAGNOSIS — J810 Acute pulmonary edema: Secondary | ICD-10-CM

## 2018-04-20 DIAGNOSIS — E1169 Type 2 diabetes mellitus with other specified complication: Secondary | ICD-10-CM

## 2018-04-20 DIAGNOSIS — F331 Major depressive disorder, recurrent, moderate: Secondary | ICD-10-CM

## 2018-04-20 DIAGNOSIS — G894 Chronic pain syndrome: Secondary | ICD-10-CM

## 2018-04-20 DIAGNOSIS — N401 Enlarged prostate with lower urinary tract symptoms: Secondary | ICD-10-CM

## 2018-04-20 DIAGNOSIS — F411 Generalized anxiety disorder: Secondary | ICD-10-CM

## 2018-04-20 DIAGNOSIS — G40909 Epilepsy, unspecified, not intractable, without status epilepticus: Secondary | ICD-10-CM | POA: Diagnosis not present

## 2018-04-20 DIAGNOSIS — G8929 Other chronic pain: Secondary | ICD-10-CM

## 2018-04-20 DIAGNOSIS — Z86711 Personal history of pulmonary embolism: Secondary | ICD-10-CM

## 2018-04-20 DIAGNOSIS — M1380 Other specified arthritis, unspecified site: Secondary | ICD-10-CM | POA: Diagnosis not present

## 2018-04-20 DIAGNOSIS — Z0289 Encounter for other administrative examinations: Secondary | ICD-10-CM

## 2018-04-20 DIAGNOSIS — Z7901 Long term (current) use of anticoagulants: Secondary | ICD-10-CM

## 2018-04-20 DIAGNOSIS — F1120 Opioid dependence, uncomplicated: Secondary | ICD-10-CM

## 2018-04-20 DIAGNOSIS — G890 Central pain syndrome: Secondary | ICD-10-CM | POA: Diagnosis not present

## 2018-04-20 DIAGNOSIS — E662 Morbid (severe) obesity with alveolar hypoventilation: Secondary | ICD-10-CM

## 2018-04-20 LAB — COAGUCHEK XS/INR WAIVED
INR: 1.8 — ABNORMAL HIGH (ref 0.9–1.1)
Prothrombin Time: 21.7 s

## 2018-04-20 MED ORDER — HYDROCODONE-ACETAMINOPHEN 7.5-325 MG PO TABS: 1.0000 | ORAL_TABLET | Freq: Three times a day (TID) | ORAL | 0 refills | Status: DC | PRN

## 2018-04-20 MED ORDER — FENTANYL 75 MCG/HR TD PT72: 75.0000 ug | MEDICATED_PATCH | TRANSDERMAL | 0 refills | Status: DC

## 2018-04-20 NOTE — Progress Notes (Signed)
Subjective:    Patient ID: [name], male    DOB: 1966/04/06, 52 y.o.   MRN: [id number]  Chief Complaint  Patient presents with  . Medical Management of Chronic Issues    with refills   Pt presents to the office today for chronic follow up. Pt is followed by neurologistsannuallyfor migraines and seizures.PT is followed by Ortho for osteoarthritisevery 3 months. Endocrinologists every 2 months for uncontrolled DM. Hypertension  This is a chronic problem. The current episode started more than 1 year ago. The problem has been resolved since onset. The problem is controlled. Associated symptoms include malaise/fatigue, peripheral edema and shortness of breath. Pertinent negatives include no blurred vision or headaches. Risk factors for coronary artery disease include diabetes mellitus, male gender and sedentary lifestyle. The current treatment provides moderate improvement. Hypertensive end-organ damage includes kidney disease, CAD/MI and heart failure.  Hyperlipidemia  This is a chronic problem. The current episode started more than 1 year ago. The problem is controlled. Recent lipid tests were reviewed and are normal. Exacerbating diseases include obesity. Associated symptoms include shortness of breath. Current antihyperlipidemic treatment includes statins. The current treatment provides moderate improvement of lipids. Risk factors for coronary artery disease include diabetes mellitus, hypertension, a sedentary lifestyle, dyslipidemia, male sex and obesity.  Diabetes  He presents for his follow-up diabetic visit. He has type 2 diabetes mellitus. His disease course has been stable. Pertinent negatives for hypoglycemia include no headaches. Associated symptoms include foot paresthesias. Pertinent negatives for diabetes include no blurred vision and no visual change. Symptoms are worsening. He is following a generally unhealthy diet. His overall blood glucose range is 140-180 mg/dl. Eye  exam is current.  Arthritis  Presents for follow-up visit. He complains of pain and stiffness. The symptoms have been stable. Affected locations include the right shoulder, left shoulder, right knee and left knee. His pain is at a severity of 5/10.  Benign Prostatic Hypertrophy  This is a chronic problem. The current episode started more than 1 year ago. The problem has been waxing and waning since onset. Irritative symptoms include nocturia (6). Pertinent negatives include no hematuria.  Depression         This is a chronic problem.  The current episode started more than 1 year ago.   The onset quality is gradual.   The problem occurs intermittently.  The problem has been waxing and waning since onset.  Associated symptoms include irritable, restlessness and sad.  Associated symptoms include no helplessness, no hopelessness and no headaches.  Past treatments include SNRIs - Serotonin and norepinephrine reuptake inhibitors. Back Pain  This is a chronic problem. The current episode started more than 1 year ago. The problem occurs intermittently. The problem has been waxing and waning since onset. The pain is present in the lumbar spine. The quality of the pain is described as aching. The pain is at a severity of 8/10. The pain is moderate. Pertinent negatives include no headaches.      Review of Systems  Constitutional: Positive for malaise/fatigue.  Eyes: Negative for blurred vision.  Respiratory: Positive for shortness of breath.   Genitourinary: Positive for nocturia (6). Negative for hematuria.  Musculoskeletal: Positive for arthritis, back pain and stiffness.  Neurological: Negative for headaches.  Psychiatric/Behavioral: Positive for depression.  All other systems reviewed and are negative.      Objective:   Physical Exam  Constitutional: He is oriented to person, place, and time. He appears well-developed and well-nourished. He is irritable.  No distress.  Morbid obese   HENT:    Head: Normocephalic.  Right Ear: External ear normal.  Left Ear: External ear normal.  Mouth/Throat: Oropharynx is clear and moist.  Eyes: Pupils are equal, round, and reactive to light. Right eye exhibits no discharge. Left eye exhibits no discharge.  Neck: Normal range of motion. Neck supple. No thyromegaly present.  Cardiovascular: Normal rate, regular rhythm, normal heart sounds and intact distal pulses.  No murmur heard. Pulmonary/Chest: Effort normal and breath sounds normal. No respiratory distress. He has no wheezes.  Abdominal: Soft. Bowel sounds are normal. He exhibits no distension. There is no tenderness.  Musculoskeletal: He exhibits edema (2+ BLE). He exhibits no tenderness.  Deconditioned, generalized weakness  Neurological: He is alert and oriented to person, place, and time. He has normal reflexes. No cranial nerve deficit.  Skin: Skin is warm and dry. No rash noted. No erythema.  Psychiatric: He has a normal mood and affect. His behavior is normal. Judgment and thought content normal.  Vitals reviewed.     BP 138/89   Pulse 71   Temp 99.3 F (37.4 C) (Oral)   Ht 6' 1"  (1.854 m)   BMI 59.69 kg/m      Assessment & Plan:  [name] comes in today with chief complaint of Medical Management of Chronic Issues (with refills)   Diagnosis and orders addressed:  1. Type 2 diabetes mellitus with hyperglycemia, with long-term current use of insulin (HCC) - CMP14+EGFR  2. Hyperlipidemia associated with type 2 diabetes mellitus ([place]) - CMP14+EGFR  3. Seizure disorder ([place]) - CMP14+EGFR  4. Benign prostatic hyperplasia with lower urinary tract symptoms, symptom details unspecified - CMP14+EGFR  5. Morbid obesity ([place]) - CMP14+EGFR  6. History of pulmonary embolism - CMP14+EGFR  7. Chronic pain syndrome We will increase Norco to TID prn and continue Fentanyl, long discussion with patient about referral to Pain Clinic - fentaNYL (DURAGESIC - DOSED  MCG/HR) 75 MCG/HR; Place 1 patch (75 mcg total) onto the skin every 3 (three) days.  Dispense: 10 patch; Refill: 0 - fentaNYL (DURAGESIC) 75 MCG/HR; Place 1 patch (75 mcg total) onto the skin every 3 (three) days.  Dispense: 10 patch; Refill: 0 - fentaNYL (DURAGESIC) 75 MCG/HR; Place 1 patch (75 mcg total) onto the skin every 3 (three) days.  Dispense: 10 patch; Refill: 0 - CMP14+EGFR - HYDROcodone-acetaminophen (NORCO) 7.5-325 MG tablet; Take 1 tablet by mouth 3 (three) times daily as needed for moderate pain.  Dispense: 90 tablet; Refill: 0 - HYDROcodone-acetaminophen (NORCO) 7.5-325 MG tablet; Take 1 tablet by mouth 3 (three) times daily as needed for moderate pain. May fill June 4th, 2018 or after  Dispense: 90 tablet; Refill: 0 - HYDROcodone-acetaminophen (NORCO) 7.5-325 MG tablet; Take 1 tablet by mouth 3 (three) times daily as needed for moderate pain.  Dispense: 90 tablet; Refill: 0  8. GAD (generalized anxiety disorder) - CMP14+EGFR  9. Uncomplicated opioid dependence (HCC) - fentaNYL (DURAGESIC - DOSED MCG/HR) 75 MCG/HR; Place 1 patch (75 mcg total) onto the skin every 3 (three) days.  Dispense: 10 patch; Refill: 0 - fentaNYL (DURAGESIC) 75 MCG/HR; Place 1 patch (75 mcg total) onto the skin every 3 (three) days.  Dispense: 10 patch; Refill: 0 - fentaNYL (DURAGESIC) 75 MCG/HR; Place 1 patch (75 mcg total) onto the skin every 3 (three) days.  Dispense: 10 patch; Refill: 0 - CMP14+EGFR - ToxASSURE Select 13 (MW), Urine - HYDROcodone-acetaminophen (NORCO) 7.5-325 MG tablet; Take 1 tablet by mouth  3 (three) times daily as needed for moderate pain.  Dispense: 90 tablet; Refill: 0 - HYDROcodone-acetaminophen (NORCO) 7.5-325 MG tablet; Take 1 tablet by mouth 3 (three) times daily as needed for moderate pain. May fill June 4th, 2018 or after  Dispense: 90 tablet; Refill: 0 - HYDROcodone-acetaminophen (NORCO) 7.5-325 MG tablet; Take 1 tablet by mouth 3 (three) times daily as needed for moderate  pain.  Dispense: 90 tablet; Refill: 0  10. Pain medication agreement signed - fentaNYL (DURAGESIC - DOSED MCG/HR) 75 MCG/HR; Place 1 patch (75 mcg total) onto the skin every 3 (three) days.  Dispense: 10 patch; Refill: 0 - fentaNYL (DURAGESIC) 75 MCG/HR; Place 1 patch (75 mcg total) onto the skin every 3 (three) days.  Dispense: 10 patch; Refill: 0 - fentaNYL (DURAGESIC) 75 MCG/HR; Place 1 patch (75 mcg total) onto the skin every 3 (three) days.  Dispense: 10 patch; Refill: 0 - CMP14+EGFR - ToxASSURE Select 13 (MW), Urine - HYDROcodone-acetaminophen (NORCO) 7.5-325 MG tablet; Take 1 tablet by mouth 3 (three) times daily as needed for moderate pain.  Dispense: 90 tablet; Refill: 0 - HYDROcodone-acetaminophen (NORCO) 7.5-325 MG tablet; Take 1 tablet by mouth 3 (three) times daily as needed for moderate pain. May fill June 4th, 2018 or after  Dispense: 90 tablet; Refill: 0 - HYDROcodone-acetaminophen (NORCO) 7.5-325 MG tablet; Take 1 tablet by mouth 3 (three) times daily as needed for moderate pain.  Dispense: 90 tablet; Refill: 0  11. Moderate episode of recurrent major depressive disorder (HCC) - CMP14+EGFR  12. Obesity hypoventilation syndrome (HCC) - CMP14+EGFR  13. Chronic bilateral low back pain with right-sided sciatica - fentaNYL (DURAGESIC - DOSED MCG/HR) 75 MCG/HR; Place 1 patch (75 mcg total) onto the skin every 3 (three) days.  Dispense: 10 patch; Refill: 0 - fentaNYL (DURAGESIC) 75 MCG/HR; Place 1 patch (75 mcg total) onto the skin every 3 (three) days.  Dispense: 10 patch; Refill: 0 - fentaNYL (DURAGESIC) 75 MCG/HR; Place 1 patch (75 mcg total) onto the skin every 3 (three) days.  Dispense: 10 patch; Refill: 0 - CMP14+EGFR - HYDROcodone-acetaminophen (NORCO) 7.5-325 MG tablet; Take 1 tablet by mouth 3 (three) times daily as needed for moderate pain.  Dispense: 90 tablet; Refill: 0 - HYDROcodone-acetaminophen (NORCO) 7.5-325 MG tablet; Take 1 tablet by mouth 3 (three) times daily as  needed for moderate pain. May fill June 4th, 2018 or after  Dispense: 90 tablet; Refill: 0 - HYDROcodone-acetaminophen (NORCO) 7.5-325 MG tablet; Take 1 tablet by mouth 3 (three) times daily as needed for moderate pain.  Dispense: 90 tablet; Refill: 0  14. Postoperative pulmonary edema (HCC) - CMP14+EGFR  15. Deep vein thrombosis (DVT) of proximal lower extremity, unspecified chronicity, unspecified laterality (HCC) - CoaguChek XS/INR Waived - CMP14+EGFR  16. On anticoagulant therapy - CoaguChek XS/INR Waived - CMP14+EGFR   Labs pending Health Maintenance reviewed Diet and exercise encouraged  Follow up plan: 3 months    Evelina Dun, FNP

## 2018-04-20 NOTE — Patient Instructions (Signed)
Shoulder Pain Many things can cause shoulder pain, including:  An injury to the area.  Overuse of the shoulder.  Arthritis.  The source of the pain can be:  Inflammation.  An injury to the shoulder joint.  An injury to a tendon, ligament, or bone.  Follow these instructions at home: Take these actions to help with your pain:  Squeeze a soft ball or a foam pad as much as possible. This helps to keep the shoulder from swelling. It also helps to strengthen the arm.  Take over-the-counter and prescription medicines only as told by your health care provider.  If directed, apply ice to the area: ? Put ice in a plastic bag. ? Place a towel between your skin and the bag. ? Leave the ice on for 20 minutes, 2-3 times per day. Stop applying ice if it does not help with the pain.  If you were given a shoulder sling or immobilizer: ? Wear it as told. ? Remove it to shower or bathe. ? Move your arm as little as possible, but keep your hand moving to prevent swelling.  Contact a health care provider if:  Your pain gets worse.  Your pain is not relieved with medicines.  New pain develops in your arm, hand, or fingers. Get help right away if:  Your arm, hand, or fingers: ? Tingle. ? Become numb. ? Become swollen. ? Become painful. ? Turn white or blue. This information is not intended to replace advice given to you by your health care provider. Make sure you discuss any questions you have with your health care provider. Document Released: 03/25/2005 Document Revised: 02/09/2016 Document Reviewed: 10/08/2014 Elsevier Interactive Patient Education  2018 Else[employer] Inc[employer]  

## 2018-04-21 ENCOUNTER — Encounter: Payer: Self-pay | Admitting: Family

## 2018-04-21 LAB — CMP14+EGFR
ALT: 24 IU/L (ref 0–44)
AST: 26 IU/L (ref 0–40)
Albumin/Globulin Ratio: 0.9 — ABNORMAL LOW (ref 1.2–2.2)
Albumin: 3.2 g/dL — ABNORMAL LOW (ref 3.5–5.5)
Alkaline Phosphatase: 101 IU/L (ref 39–117)
BUN/Creatinine Ratio: 27 — ABNORMAL HIGH (ref 9–20)
BUN: 37 mg/dL — ABNORMAL HIGH (ref 6–24)
Bilirubin Total: 0.4 mg/dL (ref 0.0–1.2)
CO2: 24 mmol/L (ref 20–29)
Calcium: 8.6 mg/dL — ABNORMAL LOW (ref 8.7–10.2)
Chloride: 100 mmol/L (ref 96–106)
Creatinine, Ser: 1.35 mg/dL — ABNORMAL HIGH (ref 0.76–1.27)
GFR calc Af Amer: 70 mL/min/{1.73_m2} (ref >59)
GFR calc non Af Amer: 60 mL/min/{1.73_m2} (ref >59)
Globulin, Total: 3.6 g/dL (ref 1.5–4.5)
Glucose: 204 mg/dL — ABNORMAL HIGH (ref 65–99)
Potassium: 4.5 mmol/L (ref 3.5–5.2)
Sodium: 136 mmol/L (ref 134–144)
Total Protein: 6.8 g/dL (ref 6.0–8.5)

## 2018-04-25 ENCOUNTER — Ambulatory Visit: Payer: Medicare Other | Admitting: Family

## 2018-04-26 ENCOUNTER — Other Ambulatory Visit: Payer: Self-pay | Admitting: Family

## 2018-05-05 ENCOUNTER — Encounter: Payer: Self-pay | Admitting: Family

## 2018-05-05 ENCOUNTER — Ambulatory Visit (INDEPENDENT_AMBULATORY_CARE_PROVIDER_SITE_OTHER): Payer: Medicare Other | Admitting: Family

## 2018-05-05 VITALS — BP 165/82 | HR 79 | Temp 99.7°F | Ht 73.0 in

## 2018-05-05 DIAGNOSIS — E662 Morbid (severe) obesity with alveolar hypoventilation: Secondary | ICD-10-CM | POA: Diagnosis not present

## 2018-05-05 DIAGNOSIS — J019 Acute sinusitis, unspecified: Secondary | ICD-10-CM | POA: Diagnosis not present

## 2018-05-05 DIAGNOSIS — Z794 Long term (current) use of insulin: Secondary | ICD-10-CM

## 2018-05-05 DIAGNOSIS — Z7901 Long term (current) use of anticoagulants: Secondary | ICD-10-CM | POA: Diagnosis not present

## 2018-05-05 DIAGNOSIS — M5127 Other intervertebral disc displacement, lumbosacral region: Secondary | ICD-10-CM

## 2018-05-05 DIAGNOSIS — E1165 Type 2 diabetes mellitus with hyperglycemia: Secondary | ICD-10-CM

## 2018-05-05 DIAGNOSIS — I824Y9 Acute embolism and thrombosis of unspecified deep veins of unspecified proximal lower extremity: Secondary | ICD-10-CM

## 2018-05-05 DIAGNOSIS — M5020 Other cervical disc displacement, unspecified cervical region: Secondary | ICD-10-CM

## 2018-05-05 LAB — COAGUCHEK XS/INR WAIVED
INR: 1.6 — ABNORMAL HIGH (ref 0.9–1.1)
Prothrombin Time: 19.7 s

## 2018-05-05 LAB — BAYER DCA HB A1C WAIVED: HB A1C (BAYER DCA - WAIVED): 8.7 % — ABNORMAL HIGH (ref <7.0)

## 2018-05-05 MED ORDER — DOXYCYCLINE HYCLATE 100 MG PO TABS: 100.0000 mg | ORAL_TABLET | Freq: Two times a day (BID) | ORAL | 0 refills | Status: DC

## 2018-05-05 NOTE — Progress Notes (Signed)
Subjective:    Patient ID: [name], male    DOB: 1966-04-02, 52 y.o.   MRN: [id number]  Chief Complaint  Patient presents with  . nose bleeds  . Sinus Problem   Pt presents to the office today with a nose bleed and sinus pain. Pt states held his warfarin dose on Tuesday and states his nose bleed. His INR today is 1.6 today.   He also complaining of worsening back pain. He would like a different referral to [facility] as he did not like the first one he saw. States he has constant pain 10 out 10 and his pain medication helps with this pain.  Sinusitis  This is a new problem. The current episode started 1 to 4 weeks ago. The problem has been waxing and waning since onset. Maximum temperature: 99. His pain is at a severity of 2/10. The pain is mild. Associated symptoms include congestion, coughing, ear pain, headaches, a hoarse voice, sinus pressure and a sore throat. Pertinent negatives include no sneezing. Past treatments include saline sprays. The treatment provided mild relief.      Review of Systems  HENT: Positive for congestion, ear pain, hoarse voice, sinus pressure and sore throat. Negative for sneezing.   Respiratory: Positive for cough.   Neurological: Positive for headaches.  All other systems reviewed and are negative.      Objective:   Physical Exam  Constitutional: He is oriented to person, place, and time. He appears well-developed and well-nourished. No distress.  Morbid obese   HENT:  Head: Normocephalic.  Right Ear: External ear normal. Tympanic membrane is erythematous.  Left Ear: External ear normal. Tympanic membrane is erythematous.  Nose: Mucosal edema and rhinorrhea present. Right sinus exhibits maxillary sinus tenderness and frontal sinus tenderness. Left sinus exhibits maxillary sinus tenderness and frontal sinus tenderness.  Mouth/Throat: Posterior oropharyngeal erythema present.  Eyes: Pupils are equal, round, and reactive to light. Right  eye exhibits no discharge. Left eye exhibits no discharge.  Neck: Normal range of motion. Neck supple. No thyromegaly present.  Cardiovascular: Normal rate, regular rhythm, normal heart sounds and intact distal pulses.  No murmur heard. Pulmonary/Chest: Effort normal and breath sounds normal. No respiratory distress. He has no wheezes.  Abdominal: Soft. Bowel sounds are normal. He exhibits no distension. There is no tenderness.  Musculoskeletal: He exhibits no edema or tenderness.  Pt in electric wheelchair, pain in lower back with flexion  Neurological: He is alert and oriented to person, place, and time. He has normal reflexes. No cranial nerve deficit.  Skin: Skin is warm and dry. No rash noted. No erythema.  Psychiatric: He has a normal mood and affect. His behavior is normal. Judgment and thought content normal.  Vitals reviewed.    BP (!) 165/82   Pulse 79   Temp 99.7 F (37.6 C) (Oral)   Ht 6\' 1"  (1.854 m)   BMI 59.69 kg/m      Assessment & Plan:  [name] comes in today with chief complaint of nose bleeds and Sinus Problem   Diagnosis and orders addressed:  1. 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 Deep vein thrombosis (DVT) of proximal lower extremity, unspecified chronicity, unspecified laterality ([place])  3. Acute sinusitis, recurrence not specified, unspecified location - Take meds as prescribed - Use a cool mist humidifier  -Use saline nose sprays frequently -Force fluids -For any cough or congestion  Use plain Mucinex- regular strength or  max strength is fine -For fever or aces or pains- take tylenol or ibuprofen. -Throat lozenges if help -RTO if symptoms worsen or do not improve - doxycycline (VIBRA-TABS) 100 MG tablet; Take 1 tablet (100 mg total) by mouth 2 (two) times daily.  Dispense: 20 tablet; Refill: 0  4. Obesity hypoventilation syndrome ([place])  5. Chronic  anticoagulation Description   Continue current dose since you held dose on 05/03/18. Start taking 7.5 mg (1 1/2 tablets on Wednesday) then continue  dose at 1 tablet (5mg ) all other days  INR today was 1.6 and your goal is 2.0-3.0  Return in 2 week      6. Protruded cervical disc - Ambulatory referral to Neurosurgery  Patrick Dun, FNP

## 2018-05-05 NOTE — Addendum Note (Signed)
Addended by: Shelbie Ammons on: 05/05/2018 10:36 AM   Modules accepted: Orders

## 2018-05-05 NOTE — Patient Instructions (Signed)

## 2018-05-05 NOTE — Addendum Note (Signed)
Addended by: Liliane Bade on: 05/05/2018 10:43 AM   Modules accepted: Orders

## 2018-05-13 DIAGNOSIS — J449 Chronic obstructive pulmonary disease, unspecified: Secondary | ICD-10-CM | POA: Diagnosis not present

## 2018-05-17 ENCOUNTER — Other Ambulatory Visit: Payer: Self-pay | Admitting: Family

## 2018-05-17 DIAGNOSIS — F411 Generalized anxiety disorder: Secondary | ICD-10-CM

## 2018-05-18 ENCOUNTER — Other Ambulatory Visit: Payer: Self-pay | Admitting: Family

## 2018-05-18 ENCOUNTER — Ambulatory Visit (INDEPENDENT_AMBULATORY_CARE_PROVIDER_SITE_OTHER): Payer: Medicare Other | Admitting: Pharmacist Clinician (PhC)/ Clinical Pharmacy Specialist

## 2018-05-18 DIAGNOSIS — I82401 Acute embolism and thrombosis of unspecified deep veins of right lower extremity: Secondary | ICD-10-CM

## 2018-05-18 DIAGNOSIS — Z7901 Long term (current) use of anticoagulants: Secondary | ICD-10-CM | POA: Diagnosis not present

## 2018-05-18 DIAGNOSIS — J810 Acute pulmonary edema: Secondary | ICD-10-CM

## 2018-05-18 LAB — COAGUCHEK XS/INR WAIVED
INR: 2.6 — ABNORMAL HIGH (ref 0.9–1.1)
Prothrombin Time: 31.1 s

## 2018-05-18 NOTE — Patient Instructions (Signed)
Description   Continue taking 1 1/2 tablets on Wednesdays and 1 tablet all other days of the week.  INR today was 2.6 and your goal is 2.0-3.0- perfect reading today

## 2018-05-21 DIAGNOSIS — G890 Central pain syndrome: Secondary | ICD-10-CM | POA: Diagnosis not present

## 2018-05-21 DIAGNOSIS — M1380 Other specified arthritis, unspecified site: Secondary | ICD-10-CM | POA: Diagnosis not present

## 2018-05-25 ENCOUNTER — Other Ambulatory Visit: Payer: Self-pay | Admitting: Family

## 2018-05-25 DIAGNOSIS — F32A Depression, unspecified: Secondary | ICD-10-CM

## 2018-05-25 DIAGNOSIS — F329 Major depressive disorder, single episode, unspecified: Secondary | ICD-10-CM

## 2018-05-27 NOTE — Telephone Encounter (Signed)
Last seen 05/05/18

## 2018-05-29 ENCOUNTER — Other Ambulatory Visit: Payer: Self-pay | Admitting: Endocrinology

## 2018-05-29 NOTE — Telephone Encounter (Signed)
Please refill x 1 Ov is due  

## 2018-06-01 ENCOUNTER — Telehealth (HOSPITAL_COMMUNITY): Payer: Self-pay

## 2018-06-01 NOTE — Telephone Encounter (Signed)
Called to schedule LP, no answer, left vm. AW

## 2018-06-02 ENCOUNTER — Other Ambulatory Visit (HOSPITAL_COMMUNITY): Payer: Self-pay | Admitting: Neurosurgery

## 2018-06-02 DIAGNOSIS — M5416 Radiculopathy, lumbar region: Secondary | ICD-10-CM

## 2018-06-07 ENCOUNTER — Other Ambulatory Visit (HOSPITAL_COMMUNITY): Payer: Self-pay | Admitting: Neurosurgery

## 2018-06-07 ENCOUNTER — Encounter (HOSPITAL_COMMUNITY): Payer: Self-pay | Admitting: Interventional Radiology

## 2018-06-07 ENCOUNTER — Ambulatory Visit (HOSPITAL_COMMUNITY)
Admission: RE | Admit: 2018-06-07 | Discharge: 2018-06-07 | Disposition: A | Discharge status: Home / Self Care | Payer: Medicare Other | Source: Ambulatory Visit | Attending: Neurosurgery | Admitting: Neurosurgery

## 2018-06-07 DIAGNOSIS — M5416 Radiculopathy, lumbar region: Secondary | ICD-10-CM

## 2018-06-07 DIAGNOSIS — M48061 Spinal stenosis, lumbar region without neurogenic claudication: Secondary | ICD-10-CM | POA: Insufficient documentation

## 2018-06-07 DIAGNOSIS — M5116 Intervertebral disc disorders with radiculopathy, lumbar region: Secondary | ICD-10-CM | POA: Insufficient documentation

## 2018-06-07 DIAGNOSIS — M5126 Other intervertebral disc displacement, lumbar region: Secondary | ICD-10-CM | POA: Insufficient documentation

## 2018-06-07 HISTORY — PX: IR EPIDUROGRAPHY: IMG2365

## 2018-06-07 MED ORDER — LIDOCAINE HCL (PF) 2 % IJ SOLN
INTRAMUSCULAR | Status: AC
  Filled 2018-06-07: qty 10

## 2018-06-07 MED ORDER — ACETAMINOPHEN 325 MG PO TABS: 650.0000 mg | ORAL_TABLET | ORAL | Status: DC | PRN

## 2018-06-07 MED ORDER — SODIUM CHLORIDE (PF) 0.9 % IJ SOLN
INTRAMUSCULAR | Status: AC
  Filled 2018-06-07: qty 10

## 2018-06-07 MED ORDER — CHLORHEXIDINE GLUCONATE 4 % EX LIQD
CUTANEOUS | Status: AC
  Filled 2018-06-07: qty 15

## 2018-06-07 MED ORDER — IOPAMIDOL (ISOVUE-M 200) INJECTION 41%
INTRAMUSCULAR | Status: AC
  Administered 2018-06-07: 5 mL
  Filled 2018-06-07: qty 10

## 2018-06-07 MED ORDER — METHYLPREDNISOLONE ACETATE 40 MG/ML IJ SUSP
INTRAMUSCULAR | Status: AC
  Administered 2018-06-07: 40 mg
  Filled 2018-06-07: qty 1

## 2018-06-07 MED ORDER — CHLORHEXIDINE GLUCONATE 4 % EX LIQD
CUTANEOUS | Status: AC | PRN
  Administered 2018-06-07: 1 via TOPICAL

## 2018-06-07 MED ORDER — METHYLPREDNISOLONE ACETATE 80 MG/ML IJ SUSP
INTRAMUSCULAR | Status: AC
  Administered 2018-06-07: 80 mg
  Filled 2018-06-07: qty 1

## 2018-06-07 MED ORDER — LIDOCAINE HCL (PF) 1 % IJ SOLN
INTRAMUSCULAR | Status: AC | PRN
  Administered 2018-06-07: 5 mL

## 2018-06-07 NOTE — Procedures (Signed)
  Procedure: R L2-3 NRB/ TFE 120mg  depomedrol EBL:   minimal Complications:  none immediate  See full dictation in [facility].  Dillard Cannon MD Main # [phone] Pager  [phone]

## 2018-06-08 ENCOUNTER — Encounter: Payer: Self-pay | Admitting: Pharmacist Clinician (PhC)/ Clinical Pharmacy Specialist

## 2018-06-08 DIAGNOSIS — M19012 Primary osteoarthritis, left shoulder: Secondary | ICD-10-CM | POA: Diagnosis not present

## 2018-06-08 DIAGNOSIS — M19011 Primary osteoarthritis, right shoulder: Secondary | ICD-10-CM | POA: Diagnosis not present

## 2018-06-09 ENCOUNTER — Other Ambulatory Visit: Payer: Self-pay | Admitting: Family

## 2018-06-09 ENCOUNTER — Other Ambulatory Visit: Payer: Self-pay | Admitting: Neurology

## 2018-06-12 DIAGNOSIS — J449 Chronic obstructive pulmonary disease, unspecified: Secondary | ICD-10-CM | POA: Diagnosis not present

## 2018-06-15 ENCOUNTER — Encounter: Payer: Medicare Other | Admitting: Pharmacist Clinician (PhC)/ Clinical Pharmacy Specialist

## 2018-06-20 DIAGNOSIS — G890 Central pain syndrome: Secondary | ICD-10-CM | POA: Diagnosis not present

## 2018-06-20 DIAGNOSIS — M1380 Other specified arthritis, unspecified site: Secondary | ICD-10-CM | POA: Diagnosis not present

## 2018-06-21 ENCOUNTER — Other Ambulatory Visit: Payer: Self-pay | Admitting: Family

## 2018-06-23 ENCOUNTER — Ambulatory Visit (INDEPENDENT_AMBULATORY_CARE_PROVIDER_SITE_OTHER): Payer: Medicare Other | Admitting: Family

## 2018-06-23 ENCOUNTER — Encounter: Payer: Self-pay | Admitting: Family

## 2018-06-23 VITALS — BP 123/87 | HR 88 | Temp 97.1°F

## 2018-06-23 DIAGNOSIS — I82401 Acute embolism and thrombosis of unspecified deep veins of right lower extremity: Secondary | ICD-10-CM | POA: Diagnosis not present

## 2018-06-23 DIAGNOSIS — J810 Acute pulmonary edema: Secondary | ICD-10-CM

## 2018-06-23 DIAGNOSIS — Z7901 Long term (current) use of anticoagulants: Secondary | ICD-10-CM | POA: Diagnosis not present

## 2018-06-23 DIAGNOSIS — Z86711 Personal history of pulmonary embolism: Secondary | ICD-10-CM | POA: Diagnosis not present

## 2018-06-23 LAB — COAGUCHEK XS/INR WAIVED
INR: 1.9 — ABNORMAL HIGH (ref 0.9–1.1)
Prothrombin Time: 22.8 s

## 2018-06-23 NOTE — Patient Instructions (Signed)

## 2018-06-23 NOTE — Addendum Note (Signed)
Addended by: Liliane Bade on: 06/23/2018 03:32 PM   Modules accepted: Orders

## 2018-06-23 NOTE — Progress Notes (Signed)
   Subjective:    Patient ID: [name], male    DOB: 28-Jul-1965, 52 y.o.   MRN: [id number]  Chief Complaint  Patient presents with  . protime recheck    HPI Pt presents to the office today for for INR/Promtime recheck. He has a hx of DVT and PE. He is currently taking warfarin. See anticoagulation flow sheet.   Denies any bleeding, or  bruising. He had a steroid injection in his lumbar and he held his warfarin 12/6-12/9.    Review of Systems  All other systems reviewed and are negative.      Objective:   Physical Exam Vitals signs reviewed.  Constitutional:      General: He is not in acute distress.    Appearance: He is well-developed.  Eyes:     General:        Right eye: No discharge.        Left eye: No discharge.     Pupils: Pupils are equal, round, and reactive to light.  Neck:     Musculoskeletal: Normal range of motion and neck supple.     Thyroid: No thyromegaly.  Cardiovascular:     Rate and Rhythm: Normal rate and regular rhythm.     Heart sounds: Normal heart sounds. No murmur.  Pulmonary:     Effort: Pulmonary effort is normal. No respiratory distress.     Breath sounds: Normal breath sounds. No wheezing.  Abdominal:     General: Bowel sounds are normal. There is no distension.     Palpations: Abdomen is soft.     Tenderness: There is no abdominal tenderness.  Musculoskeletal:        General: Tenderness present. No swelling.     Right lower leg: No edema.     Left lower leg: No edema.     Comments: Pt in wheelchair, pain in lower lumbar  Skin:    General: Skin is warm and dry.     Findings: No erythema or rash.  Neurological:     Mental Status: He is alert and oriented to person, place, and time.     Cranial Nerves: No cranial nerve deficit.     Deep Tendon Reflexes: Reflexes are normal and symmetric.  Psychiatric:        Behavior: Behavior normal.        Thought Content: Thought content normal.        Judgment: Judgment normal.        BP (!) 140/93   Pulse 80   Temp (!) 97.1 F (36.2 C) (Oral)      Assessment & Plan:  [name] comes in today with chief complaint of protime recheck   Diagnosis and orders addressed:  1. History of pulmonary embolism  2. Chronic anticoagulation  3. Morbid obesity ([place])  4. Postoperative pulmonary edema (HCC)  5. Acute deep vein thrombosis (DVT) of right lower extremity, unspecified vein (HCC)  Description   Continue taking 1 1/2 tablets on Wednesdays and 1 tablet all other days of the week.  INR today was 1.9 ([place]) and your goal is 2.0-3.0     We will keep his same dose since he had held 4 doses. It is borderline and almost back to normal range. He will keep chronic follow up in 1 month and we will recheck at that time.    Evelina Dun, FNP

## 2018-06-25 ENCOUNTER — Other Ambulatory Visit: Payer: Self-pay

## 2018-06-25 DIAGNOSIS — G894 Chronic pain syndrome: Secondary | ICD-10-CM

## 2018-06-25 DIAGNOSIS — G8929 Other chronic pain: Secondary | ICD-10-CM

## 2018-06-25 DIAGNOSIS — Z0289 Encounter for other administrative examinations: Secondary | ICD-10-CM

## 2018-06-25 DIAGNOSIS — F1120 Opioid dependence, uncomplicated: Secondary | ICD-10-CM

## 2018-06-25 DIAGNOSIS — M5441 Lumbago with sciatica, right side: Secondary | ICD-10-CM

## 2018-06-30 ENCOUNTER — Telehealth: Payer: Self-pay | Admitting: *Deleted

## 2018-06-30 ENCOUNTER — Other Ambulatory Visit: Payer: Self-pay | Admitting: Endocrinology

## 2018-06-30 NOTE — Telephone Encounter (Signed)
Calling needing refill on patch, was just seen, [facility] is a [facility] on [facility], they will get this prepared Pt aware

## 2018-07-06 DIAGNOSIS — H903 Sensorineural hearing loss, bilateral: Secondary | ICD-10-CM | POA: Diagnosis not present

## 2018-07-12 ENCOUNTER — Other Ambulatory Visit: Payer: Self-pay | Admitting: Family

## 2018-07-12 DIAGNOSIS — M5137 Other intervertebral disc degeneration, lumbosacral region: Secondary | ICD-10-CM | POA: Diagnosis not present

## 2018-07-12 DIAGNOSIS — F329 Major depressive disorder, single episode, unspecified: Secondary | ICD-10-CM

## 2018-07-12 DIAGNOSIS — M5136 Other intervertebral disc degeneration, lumbar region: Secondary | ICD-10-CM | POA: Diagnosis not present

## 2018-07-12 DIAGNOSIS — F32A Depression, unspecified: Secondary | ICD-10-CM

## 2018-07-12 DIAGNOSIS — E119 Type 2 diabetes mellitus without complications: Secondary | ICD-10-CM | POA: Diagnosis not present

## 2018-07-12 DIAGNOSIS — M48061 Spinal stenosis, lumbar region without neurogenic claudication: Secondary | ICD-10-CM | POA: Diagnosis not present

## 2018-07-12 MED ORDER — PREGABALIN 50 MG PO CAPS: ORAL_CAPSULE | ORAL | 2 refills | Status: DC

## 2018-07-13 DIAGNOSIS — J449 Chronic obstructive pulmonary disease, unspecified: Secondary | ICD-10-CM | POA: Diagnosis not present

## 2018-07-21 ENCOUNTER — Other Ambulatory Visit: Payer: Self-pay | Admitting: Family

## 2018-07-21 DIAGNOSIS — G890 Central pain syndrome: Secondary | ICD-10-CM | POA: Diagnosis not present

## 2018-07-21 DIAGNOSIS — M1380 Other specified arthritis, unspecified site: Secondary | ICD-10-CM | POA: Diagnosis not present

## 2018-07-25 ENCOUNTER — Encounter: Payer: Self-pay | Admitting: Family

## 2018-07-25 ENCOUNTER — Ambulatory Visit (INDEPENDENT_AMBULATORY_CARE_PROVIDER_SITE_OTHER): Payer: Medicare Other | Admitting: Family

## 2018-07-25 VITALS — BP 129/85 | HR 79 | Temp 99.9°F | Ht 73.0 in

## 2018-07-25 DIAGNOSIS — E1165 Type 2 diabetes mellitus with hyperglycemia: Secondary | ICD-10-CM

## 2018-07-25 DIAGNOSIS — Z794 Long term (current) use of insulin: Secondary | ICD-10-CM | POA: Diagnosis not present

## 2018-07-25 DIAGNOSIS — J810 Acute pulmonary edema: Secondary | ICD-10-CM

## 2018-07-25 DIAGNOSIS — Z7901 Long term (current) use of anticoagulants: Secondary | ICD-10-CM

## 2018-07-25 DIAGNOSIS — G894 Chronic pain syndrome: Secondary | ICD-10-CM

## 2018-07-25 DIAGNOSIS — E1169 Type 2 diabetes mellitus with other specified complication: Secondary | ICD-10-CM | POA: Diagnosis not present

## 2018-07-25 DIAGNOSIS — K2100 Gastro-esophageal reflux disease with esophagitis, without bleeding: Secondary | ICD-10-CM

## 2018-07-25 DIAGNOSIS — G8929 Other chronic pain: Secondary | ICD-10-CM

## 2018-07-25 DIAGNOSIS — Z86711 Personal history of pulmonary embolism: Secondary | ICD-10-CM

## 2018-07-25 DIAGNOSIS — L89029 Pressure ulcer of left elbow, unspecified stage: Secondary | ICD-10-CM

## 2018-07-25 DIAGNOSIS — M2550 Pain in unspecified joint: Secondary | ICD-10-CM

## 2018-07-25 DIAGNOSIS — Z0289 Encounter for other administrative examinations: Secondary | ICD-10-CM

## 2018-07-25 DIAGNOSIS — I82401 Acute embolism and thrombosis of unspecified deep veins of right lower extremity: Secondary | ICD-10-CM

## 2018-07-25 DIAGNOSIS — E785 Hyperlipidemia, unspecified: Secondary | ICD-10-CM | POA: Diagnosis not present

## 2018-07-25 DIAGNOSIS — M5441 Lumbago with sciatica, right side: Secondary | ICD-10-CM

## 2018-07-25 DIAGNOSIS — F411 Generalized anxiety disorder: Secondary | ICD-10-CM

## 2018-07-25 DIAGNOSIS — E559 Vitamin D deficiency, unspecified: Secondary | ICD-10-CM

## 2018-07-25 DIAGNOSIS — F331 Major depressive disorder, recurrent, moderate: Secondary | ICD-10-CM

## 2018-07-25 DIAGNOSIS — F1120 Opioid dependence, uncomplicated: Secondary | ICD-10-CM

## 2018-07-25 LAB — COAGUCHEK XS/INR WAIVED
INR: 2.2 — ABNORMAL HIGH (ref 0.9–1.1)
Prothrombin Time: 26.6 s

## 2018-07-25 LAB — BAYER DCA HB A1C WAIVED: HB A1C (BAYER DCA - WAIVED): 8.3 % — ABNORMAL HIGH (ref <7.0)

## 2018-07-25 MED ORDER — FENTANYL 75 MCG/HR TD PT72: 1.0000 | MEDICATED_PATCH | TRANSDERMAL | 0 refills | Status: DC

## 2018-07-25 MED ORDER — HYDROCODONE-ACETAMINOPHEN 7.5-325 MG PO TABS: 1.0000 | ORAL_TABLET | Freq: Three times a day (TID) | ORAL | 0 refills | Status: DC | PRN

## 2018-07-25 MED ORDER — SIMVASTATIN 20 MG PO TABS: 20.0000 mg | ORAL_TABLET | Freq: Every day | ORAL | 3 refills | Status: DC

## 2018-07-25 NOTE — Progress Notes (Signed)
Subjective:    Patient ID: [name], male    DOB: December 11, 1965, 53 y.o.   MRN: [id number]  Chief Complaint  Patient presents with  . INR recheck   Pt presents to the office today for chronic follow up. Pt is followed by neurologistsannuallyfor migraines and seizures.PT is followed by Ortho for osteoarthritisevery 3 months.Endocrinologists every 2 months for uncontrolled DM. Diabetes  He presents for his follow-up diabetic visit. He has type 2 diabetes mellitus. His disease course has been worsening. Associated symptoms include foot paresthesias. Pertinent negatives for diabetes include no blurred vision and no visual change. Symptoms are stable. Diabetic complications include peripheral neuropathy. Pertinent negatives for diabetic complications include no CVA or heart disease. Risk factors for coronary artery disease include dyslipidemia, diabetes mellitus, male sex and hypertension. He is following a generally unhealthy diet. His overall blood glucose range is 90-110 mg/dl. Eye exam is current.  Hyperlipidemia  This is a chronic problem. The current episode started more than 1 year ago. The problem is controlled. Recent lipid tests were reviewed and are normal. Exacerbating diseases include obesity. Current antihyperlipidemic treatment includes statins. The current treatment provides moderate improvement of lipids. Risk factors for coronary artery disease include diabetes mellitus, dyslipidemia, male sex, hypertension and a sedentary lifestyle.  Anxiety  Presents for follow-up visit. Symptoms include decreased concentration, depressed mood, excessive worry and irritability. Symptoms occur most days. The severity of symptoms is moderate.    Depression         This is a chronic problem.  The current episode started more than 1 year ago.   The onset quality is sudden.   The problem occurs intermittently.  Associated symptoms include decreased concentration, irritable, decreased interest  and sad.  Associated symptoms include no helplessness and no hopelessness.  Past treatments include SSRIs - Selective serotonin reuptake inhibitors.  Compliance with treatment is good.  Past medical history includes anxiety.   Back Pain   Arthritis  Presents for follow-up visit. He complains of pain and stiffness. The symptoms have been improving. Affected locations include the right knee, left knee and neck (back). His pain is at a severity of 9/10.      Review of Systems  Constitutional: Positive for irritability.  Eyes: Negative for blurred vision.  Musculoskeletal: Positive for arthritis, back pain and stiffness.  Psychiatric/Behavioral: Positive for decreased concentration and depression.  All other systems reviewed and are negative.      Objective:   Physical Exam Vitals signs reviewed.  Constitutional:      General: He is irritable. He is not in acute distress.    Appearance: He is well-developed. He is obese.  HENT:     Head: Normocephalic.     Right Ear: External ear normal.     Left Ear: External ear normal.  Eyes:     General:        Right eye: No discharge.        Left eye: No discharge.     Pupils: Pupils are equal, round, and reactive to light.  Neck:     Musculoskeletal: Normal range of motion and neck supple.     Thyroid: No thyromegaly.  Cardiovascular:     Rate and Rhythm: Normal rate and regular rhythm.     Heart sounds: Normal heart sounds. No murmur.  Pulmonary:     Effort: Pulmonary effort is normal. No respiratory distress.     Breath sounds: Normal breath sounds. No wheezing.  Abdominal:  General: Bowel sounds are normal. There is no distension.     Palpations: Abdomen is soft.     Tenderness: There is no abdominal tenderness.  Musculoskeletal: Normal range of motion.        General: No tenderness.     Right lower leg: Right lower leg edema: compression hose on.     Left lower leg: Left lower leg edema: compression hose      Comments: In  wheelchair, generalized weakness, pain in lower lumbar with flexion and extension  Skin:    General: Skin is warm and dry.     Findings: No erythema or rash.     Comments: Pressure ulcer on left elbow approx 2X1Cm  Neurological:     Mental Status: He is alert and oriented to person, place, and time.     Cranial Nerves: No cranial nerve deficit.     Deep Tendon Reflexes: Reflexes are normal and symmetric.  Psychiatric:        Behavior: Behavior normal.        Thought Content: Thought content normal.        Judgment: Judgment normal.       BP 129/85   Pulse 79   Temp 99.9 F (37.7 C) (Oral)   Ht 6' 1"  (1.854 m)   BMI 59.69 kg/m      Assessment & Plan:  [name] comes in today with chief complaint of INR recheck   Diagnosis and orders addressed:  1. Type 2 diabetes mellitus with hyperglycemia, with long-term current use of insulin (HCC) - CMP14+EGFR - CBC with Differential/Platelet - Bayer DCA Hb A1c Waived  2. Hyperlipidemia associated with type 2 diabetes mellitus ([place]) - CMP14+EGFR - CBC with Differential/Platelet - Lipid panel  3. Gastroesophageal reflux disease with esophagitis - CMP14+EGFR - CBC with Differential/Platelet  4. Morbid obesity ([place]) - CMP14+EGFR - CBC with Differential/Platelet  5. History of pulmonary embolism - CMP14+EGFR - CBC with Differential/Platelet  6. Chronic anticoagulation - CMP14+EGFR - CBC with Differential/Platelet - CoaguChek XS/INR Waived  7. Chronic pain syndrome - CMP14+EGFR - CBC with Differential/Platelet - Rheumatoid factor  8. GAD (generalized anxiety disorder) - CMP14+EGFR - CBC with Differential/Platelet  9. Uncomplicated opioid dependence ([place]) - CMP14+EGFR - CBC with Differential/Platelet  10. Pain medication agreement signed - CMP14+EGFR - CBC with Differential/Platelet  11. Moderate episode of recurrent major depressive disorder (HCC) - CMP14+EGFR - CBC with  Differential/Platelet  12. Chronic bilateral low back pain with right-sided sciatica - CMP14+EGFR - CBC with Differential/Platelet  13. Postoperative pulmonary edema (HCC) - CMP14+EGFR - CBC with Differential/Platelet  14. Acute deep vein thrombosis (DVT) of right lower extremity, unspecified vein (HCC) - CMP14+EGFR - CBC with Differential/Platelet  15. Arthralgia, unspecified joint - CMP14+EGFR - CBC with Differential/Platelet - Rheumatoid factor  16. Vitamin D deficiency - CMP14+EGFR - CBC with Differential/Platelet - VITAMIN D 25 Hydroxy (Vit-D Deficiency, Fractures)  17. Pressure injury of skin of left elbow, unspecified injury stage Mepilex every 3 days   Labs pending Pt reviewed in [place] controlled database- No red flags Health Maintenance reviewed Diet and exercise encouraged  Follow up plan: 6 months    Evelina Dun, FNP

## 2018-07-25 NOTE — Patient Instructions (Signed)
Pressure Injury  A pressure injury is damage to the skin and underlying tissue that results from pressure being applied to an area of the body. It often affects people who must spend a long time in a bed or chair because of a medical condition. Pressure injuries usually occur:  Over bony parts of the body, such as the tailbone, shoulders, elbows, hips, heels, spine, ankles, and back of the head.  Under medical devices that make contact with the body, such as respiratory equipment, stockings, tubes, and splints. Pressure injuries start as reddened areas on the skin and can lead to pain and an open wound. What are the causes? This condition is caused by frequent or constant pressure to an area of the body. Decreased blood flow to the skin can eventually cause the skin tissue to die and break down, causing a wound. What increases the risk? You are more likely to develop this condition if you:  Are in the hospital or an extended care facility.  Are bedridden or in a wheelchair.  Have an injury or disease that keeps you from: ? Moving normally. ? Feeling pain or pressure.  Have a condition that: ? Makes you sleepy or less alert. ? Causes poor blood flow.  Need to wear a medical device.  Have poor control of your bladder or bowel functions (incontinence).  Have poor nutrition (malnutrition). If you are at risk for pressure injuries, your health care provider may recommend certain types of mattresses, mattress covers, pillows, cushions, or boots to help prevent them. These may include products filled with air, foam, gel, or sand. What are the signs or symptoms? Symptoms of this condition depend on the severity of the injury. Symptoms may include:  Red or dark areas of the skin.  Pain, warmth, or a change of skin texture.  Blisters.  An open wound. How is this diagnosed? This condition is diagnosed with a medical history and physical exam. You may also have tests, such as:  Blood  tests.  Imaging tests.  Blood flow tests. Your pressure injury will be staged based on its severity. Staging is based on:  The depth of the tissue injury, including whether there is exposure of muscle, bone, or tendon.  The cause of the pressure injury. How is this treated? This condition may be treated by:  Relieving or redistributing pressure on your skin. This includes: ? Frequently changing your position. ? Avoiding positions that caused the wound or that can make the wound worse. ? Using specific bed mattresses, chair cushions, or protective boots. ? Moving medical devices from an area of pressure, or placing padding between the skin and the device. ? Using foams, creams, or powders to prevent rubbing (friction) on the skin.  Keeping your skin clean and dry. This may include using a skin cleanser or skin barrier as told by your health care provider.  Cleaning your injury and removing any dead tissue from the wound (debridement).  Placing a bandage (dressing) over your injury.  Using medicines for pain or to prevent or treat infection. Surgery may be needed if other treatments are not working or if your injury is very deep. Follow these instructions at home: Wound care  Follow instructions from your health care provider about how to take care of your wound. Make sure you: ? Wash your hands with soap and water before and after you change your bandage (dressing). If soap and water are not available, use hand sanitizer. ? Change your dressing as told   by your health care provider.  Check your wound every day for signs of infection. Have a caregiver do this for you if you are not able. Check for: ? Redness, swelling, or increased pain. ? More fluid or blood. ? Warmth. ? Pus or a bad smell. Skin care  Keep your skin clean and dry. Gently pat your skin dry.  Do not rub or massage your skin.  You or a caregiver should check your skin every day for any changes in color or  any new blisters or sores (ulcers). Medicines  Take over-the-counter and prescription medicines only as told by your health care provider.  If you were prescribed an antibiotic medicine, take or apply it as told by your health care provider. Do not stop using the antibiotic even if your condition improves. Reducing and redistributing pressure  Do not lie or sit in one position for a long time. Move or change position every 1-2 hours, or as told by your health care provider.  Use pillows or cushions to reduce pressure. Ask your health care provider to recommend cushions or pads for you. General instructions   Eat a healthy diet that includes lots of protein.  Drink enough fluid to keep your urine pale yellow.  Be as active as you can every day. Ask your health care provider to suggest safe exercises or activities.  Do not abuse drugs or alcohol.  Do not use any products that contain nicotine or tobacco, such as cigarettes, e-cigarettes, and chewing tobacco. If you need help quitting, ask your health care provider.  Keep all follow-up visits as told by your health care provider. This is important. Contact a health care provider if:  You have: ? A fever or chills. ? Pain that is not helped by medicine. ? Any changes in skin color. ? New blisters or sores. ? Pus or a bad smell coming from your wound. ? Redness, swelling, or pain around your wound. ? More fluid or blood coming from your wound.  Your wound does not improve after 1-2 weeks of treatment. Summary  A pressure injury is damage to the skin and underlying tissue that results from pressure being applied to an area of the body.  Do not lie or sit in one position for a long time. Your health care provider may advise you to move or change position every 1-2 hours.  Follow instructions from your health care provider about how to take care of your wound.  Keep all follow-up visits as told by your health care provider. This  is important. This information is not intended to replace advice given to you by your health care provider. Make sure you discuss any questions you have with your health care provider. Document Released: 06/15/2005 Document Revised: 01/12/2018 Document Reviewed: 01/12/2018 Elsevier Interactive Patient Education  2019 [employer]  

## 2018-07-26 ENCOUNTER — Other Ambulatory Visit: Payer: Self-pay | Admitting: Family

## 2018-07-26 LAB — VITAMIN D 25 HYDROXY (VIT D DEFICIENCY, FRACTURES): Vit D, 25-Hydroxy: 21.4 ng/mL — ABNORMAL LOW (ref 30.0–100.0)

## 2018-07-26 LAB — LIPID PANEL
Chol/HDL Ratio: 2.7 ratio (ref 0.0–5.0)
Cholesterol, Total: 115 mg/dL (ref 100–199)
HDL: 42 mg/dL (ref >39)
LDL Calculated: 37 mg/dL (ref 0–99)
Triglycerides: 181 mg/dL — ABNORMAL HIGH (ref 0–149)
VLDL Cholesterol Cal: 36 mg/dL (ref 5–40)

## 2018-07-26 LAB — CBC WITH DIFFERENTIAL/PLATELET
Basophils Absolute: 0.1 10*3/uL (ref 0.0–0.2)
Basos: 1 %
EOS (ABSOLUTE): 0.2 10*3/uL (ref 0.0–0.4)
Eos: 4 %
Hematocrit: 42.7 % (ref 37.5–51.0)
Hemoglobin: 13.9 g/dL (ref 13.0–17.7)
Immature Grans (Abs): 0 10*3/uL (ref 0.0–0.1)
Immature Granulocytes: 0 %
Lymphocytes Absolute: 1.5 10*3/uL (ref 0.7–3.1)
Lymphs: 27 %
MCH: 27.3 pg (ref 26.6–33.0)
MCHC: 32.6 g/dL (ref 31.5–35.7)
MCV: 84 fL (ref 79–97)
Monocytes Absolute: 0.5 10*3/uL (ref 0.1–0.9)
Monocytes: 9 %
Neutrophils Absolute: 3.2 10*3/uL (ref 1.4–7.0)
Neutrophils: 59 %
Platelets: 111 10*3/uL — ABNORMAL LOW (ref 150–450)
RBC: 5.1 x10E6/uL (ref 4.14–5.80)
RDW: 16.7 % — ABNORMAL HIGH (ref 11.6–15.4)
WBC: 5.4 10*3/uL (ref 3.4–10.8)

## 2018-07-26 LAB — CMP14+EGFR
ALT: 16 IU/L (ref 0–44)
AST: 29 IU/L (ref 0–40)
Albumin/Globulin Ratio: 0.9 — ABNORMAL LOW (ref 1.2–2.2)
Albumin: 3.1 g/dL — ABNORMAL LOW (ref 3.8–4.9)
Alkaline Phosphatase: 73 IU/L (ref 39–117)
BUN/Creatinine Ratio: 12 (ref 9–20)
BUN: 21 mg/dL (ref 6–24)
Bilirubin Total: 0.5 mg/dL (ref 0.0–1.2)
CO2: 26 mmol/L (ref 20–29)
Calcium: 8.6 mg/dL — ABNORMAL LOW (ref 8.7–10.2)
Chloride: 100 mmol/L (ref 96–106)
Creatinine, Ser: 1.74 mg/dL — ABNORMAL HIGH (ref 0.76–1.27)
GFR calc Af Amer: 51 mL/min/{1.73_m2} — ABNORMAL LOW (ref >59)
GFR calc non Af Amer: 44 mL/min/{1.73_m2} — ABNORMAL LOW (ref >59)
Globulin, Total: 3.5 g/dL (ref 1.5–4.5)
Glucose: 214 mg/dL — ABNORMAL HIGH (ref 65–99)
Potassium: 4.1 mmol/L (ref 3.5–5.2)
Sodium: 142 mmol/L (ref 134–144)
Total Protein: 6.6 g/dL (ref 6.0–8.5)

## 2018-07-26 LAB — RHEUMATOID FACTOR: Rheumatoid fact SerPl-aCnc: 19.1 IU/mL — ABNORMAL HIGH (ref 0.0–13.9)

## 2018-07-28 ENCOUNTER — Other Ambulatory Visit: Payer: Self-pay | Admitting: Family

## 2018-08-13 DIAGNOSIS — J449 Chronic obstructive pulmonary disease, unspecified: Secondary | ICD-10-CM | POA: Diagnosis not present

## 2018-08-15 ENCOUNTER — Other Ambulatory Visit: Payer: Self-pay | Admitting: Family

## 2018-08-16 ENCOUNTER — Other Ambulatory Visit: Payer: Self-pay | Admitting: Endocrinology

## 2018-08-16 ENCOUNTER — Other Ambulatory Visit: Payer: Self-pay | Admitting: Family

## 2018-08-16 DIAGNOSIS — F411 Generalized anxiety disorder: Secondary | ICD-10-CM

## 2018-08-18 ENCOUNTER — Other Ambulatory Visit: Payer: Self-pay | Admitting: Family

## 2018-08-18 ENCOUNTER — Other Ambulatory Visit: Payer: Self-pay

## 2018-08-18 MED ORDER — ESOMEPRAZOLE MAGNESIUM 40 MG PO CPDR: 40.0000 mg | DELAYED_RELEASE_CAPSULE | Freq: Every day | ORAL | 0 refills | Status: DC

## 2018-08-18 MED ORDER — LEVETIRACETAM 1000 MG PO TABS: 1000.0000 mg | ORAL_TABLET | Freq: Two times a day (BID) | ORAL | 3 refills | Status: DC

## 2018-08-21 DIAGNOSIS — G890 Central pain syndrome: Secondary | ICD-10-CM | POA: Diagnosis not present

## 2018-08-21 DIAGNOSIS — M1380 Other specified arthritis, unspecified site: Secondary | ICD-10-CM | POA: Diagnosis not present

## 2018-08-25 ENCOUNTER — Other Ambulatory Visit: Payer: Self-pay | Admitting: Family

## 2018-08-31 ENCOUNTER — Encounter: Payer: Self-pay | Admitting: Pharmacist Clinician (PhC)/ Clinical Pharmacy Specialist

## 2018-08-31 ENCOUNTER — Ambulatory Visit: Payer: Medicare Other | Admitting: Pharmacist Clinician (PhC)/ Clinical Pharmacy Specialist

## 2018-08-31 DIAGNOSIS — Z7901 Long term (current) use of anticoagulants: Secondary | ICD-10-CM

## 2018-08-31 DIAGNOSIS — I82401 Acute embolism and thrombosis of unspecified deep veins of right lower extremity: Secondary | ICD-10-CM

## 2018-08-31 DIAGNOSIS — J810 Acute pulmonary edema: Secondary | ICD-10-CM

## 2018-08-31 LAB — COAGUCHEK XS/INR WAIVED
INR: 2.1 — ABNORMAL HIGH (ref 0.9–1.1)
Prothrombin Time: 25.5 s

## 2018-08-31 NOTE — Patient Instructions (Signed)
Description   Continue taking 1 1/2 tablets on Wednesdays and 1 tablet all other days of the week.  INR today was 2.1(at goal!) and your goal is 2.0-3.0

## 2018-09-07 ENCOUNTER — Encounter: Payer: Self-pay | Admitting: Family

## 2018-09-07 ENCOUNTER — Ambulatory Visit (INDEPENDENT_AMBULATORY_CARE_PROVIDER_SITE_OTHER): Payer: Medicare Other | Admitting: Family

## 2018-09-07 ENCOUNTER — Other Ambulatory Visit: Payer: Self-pay

## 2018-09-07 VITALS — BP 160/82 | HR 78 | Temp 100.0°F | Ht 73.0 in

## 2018-09-07 DIAGNOSIS — G894 Chronic pain syndrome: Secondary | ICD-10-CM | POA: Diagnosis not present

## 2018-09-07 DIAGNOSIS — M19012 Primary osteoarthritis, left shoulder: Secondary | ICD-10-CM | POA: Diagnosis not present

## 2018-09-07 DIAGNOSIS — J189 Pneumonia, unspecified organism: Secondary | ICD-10-CM

## 2018-09-07 DIAGNOSIS — M19011 Primary osteoarthritis, right shoulder: Secondary | ICD-10-CM | POA: Diagnosis not present

## 2018-09-07 MED ORDER — ALBUTEROL SULFATE HFA 108 (90 BASE) MCG/ACT IN AERS: 2.0000 | INHALATION_SPRAY | Freq: Four times a day (QID) | RESPIRATORY_TRACT | 2 refills | Status: DC | PRN

## 2018-09-07 MED ORDER — DOXYCYCLINE HYCLATE 100 MG PO TABS: 100.0000 mg | ORAL_TABLET | Freq: Two times a day (BID) | ORAL | 0 refills | Status: DC

## 2018-09-07 NOTE — Progress Notes (Signed)
Subjective:    Patient ID: [name], male    DOB: Mar 27, 1966, 53 y.o.   MRN: [id number]  Chief Complaint  Patient presents with  . Cough  . Fever    Cough  The current episode started 1 to 4 weeks ago. The problem has been gradually worsening. The problem occurs every few minutes. The cough is productive of purulent sputum and productive of sputum. Associated symptoms include chills, ear congestion, a fever, headaches, myalgias, nasal congestion, postnasal drip, a sore throat and shortness of breath. Pertinent negatives include no ear pain or wheezing. The symptoms are aggravated by lying down. He has tried rest for the symptoms. The treatment provided mild relief. His past medical history is significant for COPD.      Review of Systems  Constitutional: Positive for chills and fever.  HENT: Positive for postnasal drip and sore throat. Negative for ear pain.   Respiratory: Positive for cough and shortness of breath. Negative for wheezing.   Musculoskeletal: Positive for myalgias.  Neurological: Positive for headaches.  All other systems reviewed and are negative.      Objective:   Physical Exam Vitals signs reviewed.  Constitutional:      General: He is not in acute distress.    Appearance: He is well-developed. He is obese.  HENT:     Head: Normocephalic.     Nose: Mucosal edema and rhinorrhea present.     Mouth/Throat:     Pharynx: Posterior oropharyngeal erythema present.  Eyes:     General:        Right eye: No discharge.        Left eye: No discharge.     Pupils: Pupils are equal, round, and reactive to light.  Neck:     Musculoskeletal: Normal range of motion and neck supple.     Thyroid: No thyromegaly.  Cardiovascular:     Rate and Rhythm: Normal rate and regular rhythm.     Heart sounds: Normal heart sounds. No murmur.  Pulmonary:     Effort: Pulmonary effort is normal. No respiratory distress.     Breath sounds: Decreased breath sounds present. No  wheezing.     Comments: Intermittent nonproductive cough Abdominal:     General: Bowel sounds are normal. There is no distension.     Palpations: Abdomen is soft.     Tenderness: There is no abdominal tenderness.  Musculoskeletal: Normal range of motion.        General: No tenderness.  Skin:    General: Skin is warm and dry.     Findings: No erythema or rash.  Neurological:     Mental Status: He is alert and oriented to person, place, and time.     Cranial Nerves: No cranial nerve deficit.     Motor: Weakness present.     Deep Tendon Reflexes: Reflexes are normal and symmetric.  Psychiatric:        Behavior: Behavior normal.        Thought Content: Thought content normal.        Judgment: Judgment normal.     BP (!) 160/82   Pulse 78   Temp 100 F (37.8 C)   Ht 6\' 1"  (1.854 m)   BMI 59.69 kg/m      Assessment & Plan:  [name] comes in today with chief complaint of Cough and Fever   Diagnosis and orders addressed:  1. Community acquired pneumonia, unspecified laterality - Take meds as prescribed -  Use a cool mist humidifier  -Use saline nose sprays frequently -Force fluids -For any cough or congestion  Use plain Mucinex- regular strength or max strength is fine -For fever or aces or pains- take tylenol or ibuprofen. -Throat lozenges if help -RTO in 2 weeks to recheck  - doxycycline (VIBRA-TABS) 100 MG tablet; Take 1 tablet (100 mg total) by mouth 2 (two) times daily.  Dispense: 20 tablet; Refill: 0 - albuterol (PROVENTIL HFA;VENTOLIN HFA) 108 (90 Base) MCG/ACT inhaler; Inhale 2 puffs into the lungs every 6 (six) hours as needed for wheezing or shortness of breath.  Dispense: 1 Inhaler; Refill: 2  2. Chronic pain syndrome - Ambulatory referral to Pain Clinic  3. Morbid obesity ([place]) - Ambulatory referral to [place] [place]

## 2018-09-07 NOTE — Patient Instructions (Signed)

## 2018-09-11 DIAGNOSIS — G4733 Obstructive sleep apnea (adult) (pediatric): Secondary | ICD-10-CM | POA: Diagnosis not present

## 2018-09-11 DIAGNOSIS — G8194 Hemiplegia, unspecified affecting left nondominant side: Secondary | ICD-10-CM | POA: Diagnosis not present

## 2018-09-11 DIAGNOSIS — J449 Chronic obstructive pulmonary disease, unspecified: Secondary | ICD-10-CM | POA: Diagnosis not present

## 2018-09-16 ENCOUNTER — Other Ambulatory Visit: Payer: Self-pay | Admitting: Endocrinology

## 2018-09-19 DIAGNOSIS — G890 Central pain syndrome: Secondary | ICD-10-CM | POA: Diagnosis not present

## 2018-09-19 DIAGNOSIS — M1380 Other specified arthritis, unspecified site: Secondary | ICD-10-CM | POA: Diagnosis not present

## 2018-09-21 ENCOUNTER — Other Ambulatory Visit: Payer: Self-pay | Admitting: Family

## 2018-09-26 ENCOUNTER — Ambulatory Visit: Payer: Medicare Other | Admitting: Neurology

## 2018-09-27 ENCOUNTER — Telehealth: Payer: Self-pay

## 2018-09-27 NOTE — Telephone Encounter (Signed)
Spoke with the patient and his wife. They were both able to go over the patient's PMH, Allergies and Medications. Patient's chart has been updated accordingly.

## 2018-09-28 ENCOUNTER — Ambulatory Visit (INDEPENDENT_AMBULATORY_CARE_PROVIDER_SITE_OTHER): Payer: Medicare Other | Admitting: Neurology

## 2018-09-28 ENCOUNTER — Encounter: Payer: Self-pay | Admitting: Neurology

## 2018-09-28 ENCOUNTER — Other Ambulatory Visit: Payer: Self-pay

## 2018-09-28 DIAGNOSIS — G43711 Chronic migraine without aura, intractable, with status migrainosus: Secondary | ICD-10-CM

## 2018-09-28 DIAGNOSIS — G43719 Chronic migraine without aura, intractable, without status migrainosus: Secondary | ICD-10-CM

## 2018-09-28 DIAGNOSIS — G40909 Epilepsy, unspecified, not intractable, without status epilepticus: Secondary | ICD-10-CM

## 2018-09-28 MED ORDER — GALCANEZUMAB-GNLM 120 MG/ML ~~LOC~~ SOAJ: 240.0000 mg | Freq: Once | SUBCUTANEOUS | 0 refills | Status: AC

## 2018-09-28 MED ORDER — LEVETIRACETAM 1000 MG PO TABS: 1000.0000 mg | ORAL_TABLET | Freq: Two times a day (BID) | ORAL | 3 refills | Status: DC

## 2018-09-28 MED ORDER — RIZATRIPTAN BENZOATE 10 MG PO TABS: ORAL_TABLET | ORAL | 11 refills | Status: DC

## 2018-09-28 MED ORDER — GALCANEZUMAB-GNLM 120 MG/ML ~~LOC~~ SOAJ: 120.0000 mg | SUBCUTANEOUS | 11 refills | Status: DC

## 2018-09-28 NOTE — Addendum Note (Signed)
Addended by: Sarina Ill B on: 09/28/2018 11:12 AM   Modules accepted: Orders

## 2018-09-28 NOTE — Progress Notes (Addendum)
[facility]    Provider:  Dr Jaynee [facility] Referring Provider: Sharion Balloon, FNP Primary Care Physician:  Sharion Balloon, FNP  CC:  Migraines  Interval history 09/28/2018: Virtual Visit via Video Note  I connected with [name] on 09/28/18 at 10:30 AM EDT by a video enabled telemedicine application and verified that I am speaking with the correct person using two identifiers.   I discussed the limitations of evaluation and management by telemedicine and the availability of in person appointments. The patient expressed understanding and agreed to proceed.  Interval history 09/28/2018: He is doing well, we were not able to get CGRPs filled will try again. He has a latex allergy. His migraines are better than others. On average he daily headaches and possibly 10-15 migraine days a month. Maxalt helps when he takes it. Will try to get CGRPs he does not matter if syringe or self-injector. but prefers self-injector. No seizures. Still on the Keppra. He did not have OSA but his O2 dropped and wearing Oxygen. His wife [name] works as an Garment/textile technologist at [employer] and the [employer] has affected her job and finances but she is getting her hours in but nothing extra. He I still suffering with migraines, we coul dno tget his cgrp approved. Discussed he is on so many medications I would love to try a monthly CGRP.  He has had problems with his back, he had shoulder pain and gets injections due to arthritis. He has had hip and low back pain, he had an MRI of his lumbar spine. He has pain and radiation into the right leg and has L3 and L4 impingement. He saw ortho and neurosurgery and decided to treat conservatively. Discussed weight loss.   I reviewed images of his MRI spine and agree: 1. Shallow right subarticular disc protrusion with facet hypertrophy at L3-4 with resultant mild to moderate right lateral recess and foraminal stenosis. The right L3 or descending L4 nerve roots  could be affected.2. Mild multilevel facet hypertrophy extending from L2-3 through L5-S1.   HPI:  [name] is a 53 y.o. male here as a referral from Dr. Lenna Gilford for migraines. [name] is a 53y.o. male here as a follow up for headache ith a complicated past medical history including morbid obesity(400lbs), chronic pain, seizures, migraines, diabetes, depression, anxiety, kidney disorder, OSA, hypoxia, DM2, Obesity hypoventilation syndrome,PE and DVT on chronic warfarin. He is here for follow up of headache. LP and ophthalmology eval did not show IIH. MR brain and MRV were unremarkable. His migraines have worsened over the last month, prior to that was doing well maybe 6-7 days a month frontal, pounding, throbbing, severe light and sound sensitivity, activity worsens it, nausea and vomiting. However he has "regular" headaches, pounding and throbbing in the occipital area. Wife says he has daily headaches. He does overuse tylenol or other OTC meds. He is on chronic opioid medications, fentanyl. Patient reports at least 2 headache days a week minimum and can be severe 1-2 days. Except the last 6 months he has had at least 15 migraine migraine days a month.  Migraine Medications tried: maxalt, buspar, celexa, flexeril, Keppra, Lamictal, Lyrica, topiramate  Start Aimovig  Procedure: The patient was placed in the supine position. A temperature strip was added to the cheek area after the area was cleaned with alcohol. The Sphenocath was lubricated with gel, and placed in the right naris. The catheter was inserted above the middle turbinate to the  posterior nasal cavity, and then withdrawn 1 cm. The catheter was deployed and rotated approximately 20 towards the nose. 2-1/2 mL of 2% lidocaine was deployed. The patient was asked to swallow during the injection. The patient demonstrated erythema of the sclera of the eye on this side, and an increase in the cheek temperature was noted from 96 F  to 98 F.  This process was repeated on the left side, with similar results. The increase in cheek temperature was documented from 95 F to 25 F.  The patient tolerated the procedure well. No complications of the procedure were noted. The patient was kept in the supine position for 8 minutes following the procedure. She was given small sips of water after sitting up following the procedure.     Review of Systems: Patient complains of symptoms per HPI as well as the following symptoms: migraines. Pertinent negatives and positives per HPI. All others negative.   Social History   Socioeconomic History   Marital status: Married    Spouse name: [name]    Number of children: 1   Years of education: 12+   Highest education level: Not on file  Occupational History   Occupation: Disabled    Fish farm manager: UNEMPLOYED  Social Designer, fashion/clothing strain: Not on file   Food insecurity:    Worry: Not on file    Inability: Not on file   Transportation needs:    Medical: Not on file    Non-medical: Not on file  Tobacco Use   Smoking status: Never Smoker   Smokeless tobacco: Never Used  Substance and Sexual Activity   Alcohol use: No   Drug use: No   Sexual activity: Yes  Lifestyle   Physical activity:    Days per week: Not on file    Minutes per session: Not on file   Stress: Not on file  Relationships   Social connections:    Talks on phone: Not on file    Gets together: Not on file    Attends religious service: Not on file    Active member of club or organization: Not on file    Attends meetings of clubs or organizations: Not on file    Relationship status: Not on file   Intimate partner violence:    Fear of current or ex partner: Not on file    Emotionally abused: Not on file    Physically abused: Not on file    Forced sexual activity: Not on file  Other Topics Concern   Not on file  Social History Narrative   Divorced   No regular exercise   1  child   Patient is disabled.    Patient has an Designer, industrial/product.     Family History  Problem Relation Age of Onset   Liver cancer Mother    Cancer Mother        breast   Arthritis Father    Deep vein thrombosis Father        on warfarin   Diabetes Paternal Grandfather     Past Medical History:  Diagnosis Date   Anemia    Anxiety    Arthritis    Bipolar 1 disorder ([place])    Bronchitis    hx of   Bruises easily    Chronic kidney disease    decreased left kidney fx   Chronic pain syndrome 05/11/2012   Chronic respiratory failure with hypoxia ([place])    And with hypercapnia   Diabetes  mellitus without complication (HCC)    Diabetic neuropathy (HCC)    DVT (deep venous thrombosis) (HCC)    LLE DVT ~ '12   Dyspnea    with ambulation   GERD (gastroesophageal reflux disease)    HOH (hard of hearing) 2015   has hearing aids   Mental disorder    Migraine    Neuromuscular disorder (HCC)    Obesity hypoventilation syndrome (HCC)    Obstructive sleep apnea    CPAP   PE (pulmonary embolism)    bilateral PE ~ '11   Seizures ([place])    Thrombocytopenia ([place]) 05/11/2012    Past Surgical History:  Procedure Laterality Date   arm surgery     left arm surgery from [facility]  08/02/2008   clean   CHOLECYSTECTOMY     DENTAL SURGERY     upper and lower teeth extracted   EYE SURGERY     catracts / replacement lens   IR EPIDUROGRAPHY  06/07/2018   IR FL GUIDED LOC OF NEEDLE/CATH TIP FOR SPINAL INJECTION RT  04/11/2018   MULTIPLE EXTRACTIONS WITH ALVEOLOPLASTY  05/09/2012   Procedure: MULTIPLE EXTRACION WITH ALVEOLOPLASTY;  Surgeon: Gae Bon, DDS;  Location: [place];  Service: Oral Surgery;  Laterality: Bilateral;  Extracted teeth numbers eighteen, nineteen, twenty, twenty-one, twenty- two, twenty-three, twenty-four, twenty-five, twenty-six, twenty-seven, twenty-eight, twenty-nine, thirty, thirty- one, thirty-two and  alveoplasty lower right and left quadrants.    PATELLA FRACTURE SURGERY     left knee   TONSILLECTOMY      Current Outpatient Medications  Medication Sig Dispense Refill   ACCU-CHEK AVIVA PLUS test strip USE TO CHECK BLOOD SUGAR 4 TIMES A DAY 400 each 3   albuterol (PROVENTIL HFA;VENTOLIN HFA) 108 (90 Base) MCG/ACT inhaler Inhale 2 puffs into the lungs every 6 (six) hours as needed for wheezing or shortness of breath. 1 Inhaler 2   B-D ULTRAFINE III SHORT PEN 31G X 8 MM MISC INJECT 1 PEN INTO THE SKIN 5 (FIVE) TIMES DAILY. 500 each 3   Blood Glucose Monitoring Suppl (ACCU-CHEK COMPACT CARE KIT) KIT Use to check BG up to qid 1 each 0   busPIRone (BUSPAR) 15 MG tablet TAKE 1 TABLET (15 MG TOTAL) BY MOUTH 2 (TWO) TIMES DAILY. 180 tablet 0   clomiPHENE (CLOMID) 50 MG tablet Take 0.5 tablets (25 mg total) by mouth every other day. 8 tablet 11   cyclobenzaprine (FLEXERIL) 10 MG tablet TAKE 1/2 TO 1 TABLET BY MOUTH AS NEEDED AT BEDTIME FOR MUSCLE CRAMPS / SPASMS 90 tablet 3   DULoxetine (CYMBALTA) 60 MG capsule TAKE 1 CAPSULE BY MOUTH EVERY DAY 90 capsule 0   EPIPEN 2-PAK 0.3 MG/0.3ML SOAJ injection INJECT 0.3 MLS (0.3 MG TOTAL) INTO THE MUSCLE ONCE. AS NEEDED FOR ANAPHYLACTIC REACTION 2 Device 2   esomeprazole (NEXIUM) 40 MG capsule Take 1 capsule (40 mg total) by mouth daily at 12 noon. 90 capsule 0   fentaNYL (DURAGESIC) 75 MCG/HR Place 1 patch onto the skin every 3 (three) days. 10 patch 0   fluticasone (FLONASE) 50 MCG/ACT nasal spray PLACE 1 SPRAY INTO BOTH NOSTRILS 2 (TWO) TIMES DAILY AS NEEDED FOR ALLERGIES. 48 g 4   furosemide (LASIX) 80 MG tablet TAKE 1 TABLET (80 MG TOTAL) BY MOUTH 2 (TWO) TIMES DAILY. 180 tablet 0   Galcanezumab-gnlm (EMGALITY) 120 MG/ML SOAJ Inject 240 mg into the skin once for 1 dose. First dose is 211m. Every month after is one  dose 142m. 2 pen 0   Galcanezumab-gnlm (EMGALITY) 120 MG/ML SOAJ Inject 120 mg into the skin every 30 (thirty) days. 1 pen 11    HYDROcodone-acetaminophen (NORCO) 7.5-325 MG tablet Take 1 tablet by mouth 3 (three) times daily as needed for moderate pain. 90 tablet 0   insulin lispro (HUMALOG KWIKPEN) 100 UNIT/ML KiwkPen INJECT 35 UNITS SUBCUTANEOUSLY 3 TIMES A DAY WITH MEALS 15 pen 1   JARDIANCE 25 MG TABS tablet TAKE 1 TABLET BY MOUTH EVERY DAY 90 tablet 0   KLOR-CON M10 10 MEQ tablet TAKE 3 TABLETS (30 MEQ TOTAL) BY MOUTH DAILY. 270 tablet 1   lamoTRIgine (LAMICTAL) 200 MG tablet TAKE 1 TABLET (200 MG TOTAL) BY MOUTH 2 (TWO) TIMES DAILY. 180 tablet 0   levETIRAcetam (KEPPRA) 1000 MG tablet Take 1 tablet (1,000 mg total) by mouth 2 (two) times daily. 180 tablet 3   pregabalin (LYRICA) 50 MG capsule TAKE 1 CAPSULE BY MOUTH THREE TIMES A DAY 270 capsule 2   rizatriptan (MAXALT) 10 MG tablet May repeat in 2 hours if needed 10 tablet 11   simvastatin (ZOCOR) 20 MG tablet Take 1 tablet (20 mg total) by mouth at bedtime. 90 tablet 3   tamsulosin (FLOMAX) 0.4 MG CAPS capsule TAKE 1 CAPSULE (0.4 MG TOTAL) BY MOUTH DAILY. 90 capsule 0   TOUJEO SOLOSTAR 300 UNIT/ML SOPN INJECT 400 UNITS INTO THE SKIN DAILY. 54 pen 1   TRULICITY 1.5 MJQ/4.9EESOPN INJECT 1.5 MG SUBCUTANEOUSLY ONCE WEEKLY 12 pen 2   Vitamin D, Ergocalciferol, (DRISDOL) 1.25 MG (50000 UT) CAPS capsule TAKE 1 CAPSULE (50,000 UNITS TOTAL) BY MOUTH EVERY 7 (SEVEN) DAYS. 12 capsule 0   warfarin (COUMADIN) 5 MG tablet TAKE 1 TO 1&1/2 TABLETS BY MOUTH DAILY AS DIRECTED BY ANTI-COAG CLINIC 135 tablet 0   No current facility-administered medications for this visit.     Allergies as of 09/28/2018 - Review Complete 09/27/2018  Allergen Reaction Noted   Bee venom Anaphylaxis, Shortness Of Breath, and Swelling 04/26/2012   Other Shortness Of Breath 03/16/2014   Penicillins Anaphylaxis and Shortness Of Breath 04/26/2012   Shellfish allergy Nausea And Vomiting and Other (See Comments) 04/26/2012   Iodinated diagnostic agents  11/10/2013   Iohexol   10/08/2007   Iodine Rash 05/09/2012   Latex Rash 04/26/2012    Vitals: There were no vitals taken for this visit. Last Weight:  Wt Readings from Last 1 Encounters:  11/03/17 (!) 452 lb 6.4 oz (205.2 kg)   Last Height:   Ht Readings from Last 1 Encounters:  09/07/18 6' 1"  (1.854 m)    Physical exam: Exam: Gen: NAD, conversant, well nourised, obese, well groomed                     CV: RRR, no MRG. No Carotid Bruits. No peripheral edema, warm, nontender Eyes: Conjunctivae clear without exudates or hemorrhage  Neuro: Detailed Neurologic Exam  Speech:    Speech is normal; fluent and spontaneous with normal comprehension.  Cognition:    The patient is oriented to person, place, and time;     recent and remote memory intact;     language fluent;     normal attention, concentration,     fund of knowledge Cranial Nerves:    The pupils are equal, round, and reactive to light. The fundi are normal and spontaneous venous pulsations are present. Visual fields are full to finger confrontation. Left exotropia otherwise extraocular movements are intact. Trigeminal sensation  is intact and the muscles of mastication are normal. The face is symmetric. The palate elevates in the midline. Hearing intact. Voice is normal. Shoulder shrug is normal. The tongue has normal motion without fasciculations.  Motor Observation:    No asymmetry, no atrophy, and no involuntary movements noted. Tone:    Normal muscle tone.        Assessment/Plan:  53 year old with very complicated history including morbid obesity, chronic pain, seizures, intractable migraines, depression , ckd, anxiety, wheel chair due to obesity, pickwickian syndrome, dvt, PE, DVT and multiple other medical conditions. He was thoroughly evaluated for IIH in the past which was neg. Sleep test shoed no sleep apnea but did show hypoventilation syndrome.  - continue O2 at home for nocturnal hypoventilation syndrome - Will try CGRP for  his chronic migraines refractory too multiple classes of migraine medication - Continue Keppra for seizure prevention - Continue Maxalt for acute management - Discussed seizure precautions - continue injections for his arthritis for LBP, I recommended weight loss.   Discussed:  To prevent or relieve headaches, try the following: Cool Compress. Lie down and place a cool compress on your head.  Avoid headache triggers. If certain foods or odors seem to have triggered your migraines in the past, avoid them. A headache diary might help you identify triggers.  Include physical activity in your daily routine. Try a daily walk or other moderate aerobic exercise.  Manage stress. Find healthy ways to cope with the stressors, such as delegating tasks on your to-do list.  Practice relaxation techniques. Try deep breathing, yoga, massage and visualization.  Eat regularly. Eating regularly scheduled meals and maintaining a healthy diet might help prevent headaches. Also, drink plenty of fluids.  Follow a regular sleep schedule. Sleep deprivation might contribute to headaches Consider biofeedback. With this mind-body technique, you learn to control certain bodily functions -- such as muscle tension, heart rate and blood pressure -- to prevent headaches or reduce headache pain.    Proceed to emergency room if you experience new or worsening symptoms or symptoms do not resolve, if you have new neurologic symptoms or if headache is severe, or for any concerning symptom.   Provided education and documentation from American headache Society toolbox including articles on: chronic migraine medication overuse headache, chronic migraines, prevention of migraines, behavioral and other nonpharmacologic treatments for headache.     Follow Up Instructions:  Meds ordered this encounter  Medications   rizatriptan (MAXALT) 10 MG tablet    Sig: May repeat in 2 hours if needed    Dispense:  10 tablet    Refill:  11    levETIRAcetam (KEPPRA) 1000 MG tablet    Sig: Take 1 tablet (1,000 mg total) by mouth 2 (two) times daily.    Dispense:  180 tablet    Refill:  3   Galcanezumab-gnlm (EMGALITY) 120 MG/ML SOAJ    Sig: Inject 240 mg into the skin once for 1 dose. First dose is 273m. Every month after is one dose 1265m    Dispense:  2 pen    Refill:  0   Galcanezumab-gnlm (EMGALITY) 120 MG/ML SOAJ    Sig: Inject 120 mg into the skin every 30 (thirty) days.    Dispense:  1 pen    Refill:  11    Do not fill for 30 days. There is a separate prescription for the first dose which is 24056mthanks      I discussed the assessment and treatment  plan with the patient. The patient was provided an opportunity to ask questions and all were answered. The patient agreed with the plan and demonstrated an understanding of the instructions.   The patient was advised to call back or seek an in-person evaluation if the symptoms worsen or if the condition fails to improve as anticipated.  A total of 25 minutes was spent with this patient. Over half this time was spent on counseling patient on the  1. Intractable chronic migraine without aura and without status migrainosus   2. Chronic migraine without aura, intractable, with status migrainosus   3. Seizure disorder ([place])    diagnosis and different diagnostic and therapeutic options, counseling and coordination of care, risks ans benefits of management, compliance, or risk factor reduction and education.     I provided 30 minutes of video time during this encounter.  Sarina Ill, MD  [facility] [address] [place], [place] [address]  Phone [phone] Fax [phone]

## 2018-09-29 ENCOUNTER — Telehealth: Payer: Self-pay

## 2018-09-29 NOTE — Telephone Encounter (Signed)
Pending approval for Emgality 120 mg/mL Key: [id number] Rx #: [id number] ICD 10: Y69.485 and G43.719 Tried medications: maxalt, buspar, celexa, flexeril, Keppra, Lamictal, Lyrica, topiramate.  I will update once a decision has been made.

## 2018-09-29 NOTE — Telephone Encounter (Signed)
Received an Development worker, community for [employer]. It has been approved 09/29/2018 through 06/29/2019. A copy has been faxed to the patient's pharmacy listed below. Confirmation has been received.       [id number] - [place], [place] [phone] (Phone) [phone] (Fax)

## 2018-10-03 ENCOUNTER — Other Ambulatory Visit: Payer: Self-pay | Admitting: *Deleted

## 2018-10-03 MED ORDER — INSULIN LISPRO (1 UNIT DIAL) 100 UNIT/ML (KWIKPEN): 35.0000 [IU] | PEN_INJECTOR | Freq: Three times a day (TID) | SUBCUTANEOUS | 0 refills | Status: DC

## 2018-10-03 NOTE — Telephone Encounter (Signed)
Emgality approval Reference # [id number]

## 2018-10-12 ENCOUNTER — Encounter: Payer: Self-pay | Admitting: Pharmacist Clinician (PhC)/ Clinical Pharmacy Specialist

## 2018-10-12 DIAGNOSIS — G4733 Obstructive sleep apnea (adult) (pediatric): Secondary | ICD-10-CM | POA: Diagnosis not present

## 2018-10-12 DIAGNOSIS — G8194 Hemiplegia, unspecified affecting left nondominant side: Secondary | ICD-10-CM | POA: Diagnosis not present

## 2018-10-12 DIAGNOSIS — J449 Chronic obstructive pulmonary disease, unspecified: Secondary | ICD-10-CM | POA: Diagnosis not present

## 2018-10-13 ENCOUNTER — Other Ambulatory Visit: Payer: Self-pay | Admitting: Family

## 2018-10-20 DIAGNOSIS — G890 Central pain syndrome: Secondary | ICD-10-CM | POA: Diagnosis not present

## 2018-10-20 DIAGNOSIS — M1380 Other specified arthritis, unspecified site: Secondary | ICD-10-CM | POA: Diagnosis not present

## 2018-10-24 ENCOUNTER — Encounter: Payer: Self-pay | Admitting: Family

## 2018-10-24 ENCOUNTER — Other Ambulatory Visit: Payer: Self-pay

## 2018-10-24 ENCOUNTER — Ambulatory Visit (INDEPENDENT_AMBULATORY_CARE_PROVIDER_SITE_OTHER): Payer: Medicare Other | Admitting: Family

## 2018-10-24 VITALS — BP 132/85 | HR 76 | Temp 98.1°F

## 2018-10-24 DIAGNOSIS — F411 Generalized anxiety disorder: Secondary | ICD-10-CM

## 2018-10-24 DIAGNOSIS — Z86711 Personal history of pulmonary embolism: Secondary | ICD-10-CM

## 2018-10-24 DIAGNOSIS — E1165 Type 2 diabetes mellitus with hyperglycemia: Secondary | ICD-10-CM | POA: Diagnosis not present

## 2018-10-24 DIAGNOSIS — Z794 Long term (current) use of insulin: Secondary | ICD-10-CM | POA: Diagnosis not present

## 2018-10-24 DIAGNOSIS — J810 Acute pulmonary edema: Secondary | ICD-10-CM | POA: Diagnosis not present

## 2018-10-24 DIAGNOSIS — Z7901 Long term (current) use of anticoagulants: Secondary | ICD-10-CM

## 2018-10-24 DIAGNOSIS — Z0289 Encounter for other administrative examinations: Secondary | ICD-10-CM

## 2018-10-24 DIAGNOSIS — F1120 Opioid dependence, uncomplicated: Secondary | ICD-10-CM

## 2018-10-24 DIAGNOSIS — E1169 Type 2 diabetes mellitus with other specified complication: Secondary | ICD-10-CM

## 2018-10-24 DIAGNOSIS — I82401 Acute embolism and thrombosis of unspecified deep veins of right lower extremity: Secondary | ICD-10-CM

## 2018-10-24 DIAGNOSIS — R300 Dysuria: Secondary | ICD-10-CM | POA: Diagnosis not present

## 2018-10-24 DIAGNOSIS — F331 Major depressive disorder, recurrent, moderate: Secondary | ICD-10-CM

## 2018-10-24 DIAGNOSIS — E559 Vitamin D deficiency, unspecified: Secondary | ICD-10-CM

## 2018-10-24 DIAGNOSIS — G43711 Chronic migraine without aura, intractable, with status migrainosus: Secondary | ICD-10-CM

## 2018-10-24 DIAGNOSIS — E785 Hyperlipidemia, unspecified: Secondary | ICD-10-CM

## 2018-10-24 DIAGNOSIS — G894 Chronic pain syndrome: Secondary | ICD-10-CM | POA: Diagnosis not present

## 2018-10-24 LAB — COAGUCHEK XS/INR WAIVED
INR: 2.7 — ABNORMAL HIGH (ref 0.9–1.1)
Prothrombin Time: 32.7 s

## 2018-10-24 LAB — URINALYSIS, COMPLETE
Bilirubin, UA: NEGATIVE
Ketones, UA: NEGATIVE
Nitrite, UA: NEGATIVE
Specific Gravity, UA: 1.015 (ref 1.005–1.030)
Urobilinogen, Ur: 0.2 mg/dL (ref 0.2–1.0)
pH, UA: 6 (ref 5.0–7.5)

## 2018-10-24 LAB — MICROSCOPIC EXAMINATION
Renal Epithel, UA: NONE SEEN /HPF
WBC, UA: >30 /HPF — AB (ref 0–5)

## 2018-10-24 LAB — BAYER DCA HB A1C WAIVED: HB A1C (BAYER DCA - WAIVED): 9 % — ABNORMAL HIGH

## 2018-10-24 MED ORDER — FENTANYL 75 MCG/HR TD PT72: 1.0000 | MEDICATED_PATCH | TRANSDERMAL | 0 refills | Status: DC

## 2018-10-24 MED ORDER — HYDROCODONE-ACETAMINOPHEN 7.5-325 MG PO TABS: 1.0000 | ORAL_TABLET | Freq: Four times a day (QID) | ORAL | 0 refills | Status: DC | PRN

## 2018-10-24 MED ORDER — HYDROCODONE-ACETAMINOPHEN 7.5-325 MG PO TABS: 1.0000 | ORAL_TABLET | Freq: Three times a day (TID) | ORAL | 0 refills | Status: DC | PRN

## 2018-10-24 MED ORDER — CETIRIZINE HCL 10 MG PO TABS: 10.0000 mg | ORAL_TABLET | Freq: Every day | ORAL | 11 refills | Status: DC

## 2018-10-24 NOTE — Progress Notes (Signed)
Subjective:    Patient ID: [name], male    DOB: 1965/09/13, 53 y.o.   MRN: [id number]  Chief Complaint  Patient presents with  . Medical Management of Chronic Issues  . Diabetes   Pt presents to the office today for chronic follow up. Pt is followed by neurologistsannuallyfor migraines and seizures.PT is followed by Ortho for osteoarthritis every 3 months.Endocrinologists  for uncontrolled DM, but has not seen him recently. Diabetes  He presents for his follow-up diabetic visit. He has type 2 diabetes mellitus. His disease course has been worsening. Hypoglycemia symptoms include nervousness/anxiousness. Associated symptoms include blurred vision and foot paresthesias. There are no hypoglycemic complications. Symptoms are stable. Diabetic complications include heart disease and peripheral neuropathy. Risk factors for coronary artery disease include dyslipidemia, diabetes mellitus, male sex, hypertension, sedentary lifestyle and obesity. His weight is stable. He is following a generally unhealthy diet. He rarely participates in exercise. His overall blood glucose range is 140-180 mg/dl.  Gastroesophageal Reflux  He complains of heartburn and a hoarse voice. He reports no belching. This is a chronic problem. The current episode started more than 1 year ago. The problem occurs occasionally. The problem has been waxing and waning. Risk factors include obesity. He has tried a PPI for the symptoms. The treatment provided moderate relief.  Hyperlipidemia  This is a chronic problem. The current episode started more than 1 year ago. The problem is controlled. Recent lipid tests were reviewed and are normal. Exacerbating diseases include obesity. Current antihyperlipidemic treatment includes statins. The current treatment provides moderate improvement of lipids. Risk factors for coronary artery disease include dyslipidemia, diabetes mellitus, male sex, hypertension and a sedentary lifestyle.   Anxiety  Presents for follow-up visit. Symptoms include decreased concentration, depressed mood, excessive worry, irritability, nervous/anxious behavior and restlessness. Symptoms occur occasionally. The severity of symptoms is moderate. The quality of sleep is good.   His past medical history is significant for anemia.  Depression         This is a chronic problem.  The current episode started more than 1 year ago.   The onset quality is gradual.   The problem occurs intermittently.  Associated symptoms include decreased concentration, irritable, restlessness and sad.  Associated symptoms include no helplessness and no hopelessness.  Past treatments include SNRIs - Serotonin and norepinephrine reuptake inhibitors.  Past medical history includes anxiety.   Anemia  Presents for follow-up visit. Symptoms include leg swelling.  Migraine   This is a chronic problem. The current episode started more than 1 year ago. The problem has been waxing and waning. Associated symptoms include blurred vision. His past medical history is significant for obesity.  Hx PE and DVT See anticoagulation  flow sheet.     Review of Systems  Constitutional: Positive for irritability.  HENT: Positive for hoarse voice.   Eyes: Positive for blurred vision.  Gastrointestinal: Positive for heartburn.  Psychiatric/Behavioral: Positive for decreased concentration and depression. The patient is nervous/anxious.   All other systems reviewed and are negative.      Objective:   Physical Exam Vitals signs reviewed.  Constitutional:      General: He is irritable. He is not in acute distress.    Appearance: He is well-developed.  HENT:     Head: Normocephalic.     Right Ear: Tympanic membrane normal.     Left Ear: Tympanic membrane normal.  Eyes:     General:        Right eye: No  discharge.        Left eye: No discharge.     Pupils: Pupils are equal, round, and reactive to light.  Neck:     Musculoskeletal: Normal  range of motion and neck supple.     Thyroid: No thyromegaly.  Cardiovascular:     Rate and Rhythm: Normal rate and regular rhythm.     Heart sounds: Normal heart sounds. No murmur.  Pulmonary:     Effort: Pulmonary effort is normal. No respiratory distress.     Breath sounds: Normal breath sounds. No wheezing.  Abdominal:     General: Bowel sounds are normal. There is no distension.     Palpations: Abdomen is soft.     Tenderness: There is no abdominal tenderness.  Musculoskeletal:        General: No tenderness.     Right lower leg: Edema (trace) present.     Left lower leg: Edema (trace) present.     Comments: Morbid obese, in electric wheelchair   Skin:    General: Skin is warm and dry.     Findings: No erythema or rash.  Neurological:     Mental Status: He is alert and oriented to person, place, and time.     Cranial Nerves: No cranial nerve deficit.     Deep Tendon Reflexes: Reflexes are normal and symmetric.  Psychiatric:        Behavior: Behavior normal.        Thought Content: Thought content normal.        Judgment: Judgment normal.     BP 132/85   Pulse 76   Temp 98.1 F (36.7 C) (Oral)        Assessment & Plan:  [name] comes in today with chief complaint of Medical Management of Chronic Issues and Diabetes   Diagnosis and orders addressed: 1. Chronic anticoagulation - CoaguChek XS/INR Waived - CMP14+EGFR - CBC with Differential/Platelet  2. Postoperative pulmonary edema (HCC) - CMP14+EGFR - CBC with Differential/Platelet  3. Acute deep vein thrombosis (DVT) of right lower extremity, unspecified vein (HCC) - CMP14+EGFR - CBC with Differential/Platelet  4. Chronic migraine without aura, intractable, with status migrainosus - CMP14+EGFR - CBC with Differential/Platelet  5. Moderate episode of recurrent major depressive disorder (HCC) - CMP14+EGFR - CBC with Differential/Platelet  6. Type 2 diabetes mellitus with hyperglycemia, with  long-term current use of insulin (HCC) - Bayer DCA Hb A1c Waived - CMP14+EGFR - CBC with Differential/Platelet - Microalbumin / creatinine urine ratio  7. GAD (generalized anxiety disorder) - CMP14+EGFR - CBC with Differential/Platelet  8. History of pulmonary embolism - CMP14+EGFR - CBC with Differential/Platelet  9. Hyperlipidemia associated with type 2 diabetes mellitus (HCC) - CMP14+EGFR - CBC with Differential/Platelet - Lipid panel  10. Morbid obesity ([place]) - CMP14+EGFR - CBC with Differential/Platelet  11. Uncomplicated opioid dependence ([place]) Pt reviewed in [place] controlled database- NO red flags noted We placed a referral to Pain clinic on last visit, but the pain clinic refused to see him. Will place another referral.  - Ambulatory referral to Pain Clinic - fentaNYL (DURAGESIC) 75 MCG/HR; Place 1 patch onto the skin every 3 (three) days.  Dispense: 10 patch; Refill: 0 - HYDROcodone-acetaminophen (NORCO) 7.5-325 MG tablet; Take 1 tablet by mouth 3 (three) times daily as needed for moderate pain.  Dispense: 90 tablet; Refill: 0 - fentaNYL (DURAGESIC) 75 MCG/HR; Place 1 patch onto the skin every 3 (three) days.  Dispense: 10 patch; Refill: 0 - fentaNYL ([place])  75 MCG/HR; Place 1 patch onto the skin every 3 (three) days.  Dispense: 10 patch; Refill: 0 - HYDROcodone-acetaminophen (NORCO) 7.5-325 MG tablet; Take 1 tablet by mouth every 6 (six) hours as needed for moderate pain.  Dispense: 90 tablet; Refill: 0 - HYDROcodone-acetaminophen (NORCO) 7.5-325 MG tablet; Take 1 tablet by mouth every 6 (six) hours as needed for moderate pain.  Dispense: 90 tablet; Refill: 0 - ToxASSURE Select 13 (MW), Urine - CMP14+EGFR - CBC with Differential/Platelet  12. Pain medication agreement signed - Ambulatory referral to Pain Clinic - fentaNYL (DURAGESIC) 75 MCG/HR; Place 1 patch onto the skin every 3 (three) days.  Dispense: 10 patch; Refill: 0 - HYDROcodone-acetaminophen (NORCO)  7.5-325 MG tablet; Take 1 tablet by mouth 3 (three) times daily as needed for moderate pain.  Dispense: 90 tablet; Refill: 0 - fentaNYL (DURAGESIC) 75 MCG/HR; Place 1 patch onto the skin every 3 (three) days.  Dispense: 10 patch; Refill: 0 - fentaNYL (DURAGESIC) 75 MCG/HR; Place 1 patch onto the skin every 3 (three) days.  Dispense: 10 patch; Refill: 0 - HYDROcodone-acetaminophen (NORCO) 7.5-325 MG tablet; Take 1 tablet by mouth every 6 (six) hours as needed for moderate pain.  Dispense: 90 tablet; Refill: 0 - HYDROcodone-acetaminophen (NORCO) 7.5-325 MG tablet; Take 1 tablet by mouth every 6 (six) hours as needed for moderate pain.  Dispense: 90 tablet; Refill: 0 - ToxASSURE Select 13 (MW), Urine - CMP14+EGFR - CBC with Differential/Platelet  13. Chronic pain syndrome - Ambulatory referral to Pain Clinic - fentaNYL (DURAGESIC) 75 MCG/HR; Place 1 patch onto the skin every 3 (three) days.  Dispense: 10 patch; Refill: 0 - HYDROcodone-acetaminophen (NORCO) 7.5-325 MG tablet; Take 1 tablet by mouth 3 (three) times daily as needed for moderate pain.  Dispense: 90 tablet; Refill: 0 - fentaNYL (DURAGESIC) 75 MCG/HR; Place 1 patch onto the skin every 3 (three) days.  Dispense: 10 patch; Refill: 0 - fentaNYL (DURAGESIC) 75 MCG/HR; Place 1 patch onto the skin every 3 (three) days.  Dispense: 10 patch; Refill: 0 - HYDROcodone-acetaminophen (NORCO) 7.5-325 MG tablet; Take 1 tablet by mouth every 6 (six) hours as needed for moderate pain.  Dispense: 90 tablet; Refill: 0 - HYDROcodone-acetaminophen (NORCO) 7.5-325 MG tablet; Take 1 tablet by mouth every 6 (six) hours as needed for moderate pain.  Dispense: 90 tablet; Refill: 0 - ToxASSURE Select 13 (MW), Urine - CMP14+EGFR - CBC with Differential/Platelet  14. Vitamin D deficiency - VITAMIN D 25 Hydroxy (Vit-D Deficiency, Fractures)  15. Dysuria - Urinalysis, Complete  Labs pending Health Maintenance reviewed Diet and exercise encouraged  Follow up  plan: 3 months and referral placed on [place], [place]

## 2018-10-24 NOTE — Patient Instructions (Signed)
Diabetes Mellitus and Nutrition, Adult  When you have diabetes (diabetes mellitus), it is very important to have healthy eating habits because your blood sugar (glucose) levels are greatly affected by what you eat and drink. Eating healthy foods in the appropriate amounts, at about the same times every day, can help you:  · Control your blood glucose.  · Lower your risk of heart disease.  · Improve your blood pressure.  · Reach or maintain a healthy weight.  Every person with diabetes is different, and each person has different needs for a meal plan. Your health care provider may recommend that you work with a diet and nutrition specialist (dietitian) to make a meal plan that is best for you. Your meal plan may vary depending on factors such as:  · The calories you need.  · The medicines you take.  · Your weight.  · Your blood glucose, blood pressure, and cholesterol levels.  · Your activity level.  · Other health conditions you have, such as heart or kidney disease.  How do carbohydrates affect me?  Carbohydrates, also called carbs, affect your blood glucose level more than any other type of food. Eating carbs naturally raises the amount of glucose in your blood. Carb counting is a method for keeping track of how many carbs you eat. Counting carbs is important to keep your blood glucose at a healthy level, especially if you use insulin or take certain oral diabetes medicines.  It is important to know how many carbs you can safely have in each meal. This is different for every person. Your dietitian can help you calculate how many carbs you should have at each meal and for each snack.  Foods that contain carbs include:  · Bread, cereal, rice, pasta, and crackers.  · Potatoes and corn.  · Peas, beans, and lentils.  · Milk and yogurt.  · Fruit and juice.  · Desserts, such as cakes, cookies, ice cream, and candy.  How does alcohol affect me?  Alcohol can cause a sudden decrease in blood glucose (hypoglycemia),  especially if you use insulin or take certain oral diabetes medicines. Hypoglycemia can be a life-threatening condition. Symptoms of hypoglycemia (sleepiness, dizziness, and confusion) are similar to symptoms of having too much alcohol.  If your health care provider says that alcohol is safe for you, follow these guidelines:  · Limit alcohol intake to no more than 1 drink per day for nonpregnant women and 2 drinks per day for men. One drink equals 12 oz of beer, 5 oz of wine, or 1½ oz of hard liquor.  · Do not drink on an empty stomach.  · Keep yourself hydrated with water, diet soda, or unsweetened iced tea.  · Keep in mind that regular soda, juice, and other mixers may contain a lot of sugar and must be counted as carbs.  What are tips for following this plan?    Reading food labels  · Start by checking the serving size on the "Nutrition Facts" label of packaged foods and drinks. The amount of calories, carbs, fats, and other nutrients listed on the label is based on one serving of the item. Many items contain more than one serving per package.  · Check the total grams (g) of carbs in one serving. You can calculate the number of servings of carbs in one serving by dividing the total carbs by 15. For example, if a food has 30 g of total carbs, it would be equal to 2   servings of carbs.  · Check the number of grams (g) of saturated and trans fats in one serving. Choose foods that have low or no amount of these fats.  · Check the number of milligrams (mg) of salt (sodium) in one serving. Most people should limit total sodium intake to less than 2,300 mg per day.  · Always check the nutrition information of foods labeled as "low-fat" or "nonfat". These foods may be higher in added sugar or refined carbs and should be avoided.  · Talk to your dietitian to identify your daily goals for nutrients listed on the label.  Shopping  · Avoid buying canned, premade, or processed foods. These foods tend to be high in fat, sodium,  and added sugar.  · Shop around the outside edge of the grocery store. This includes fresh fruits and vegetables, bulk grains, fresh meats, and fresh dairy.  Cooking  · Use low-heat cooking methods, such as baking, instead of high-heat cooking methods like deep frying.  · Cook using healthy oils, such as olive, canola, or sunflower oil.  · Avoid cooking with butter, cream, or high-fat meats.  Meal planning  · Eat meals and snacks regularly, preferably at the same times every day. Avoid going long periods of time without eating.  · Eat foods high in fiber, such as fresh fruits, vegetables, beans, and whole grains. Talk to your dietitian about how many servings of carbs you can eat at each meal.  · Eat 4-6 ounces (oz) of lean protein each day, such as lean meat, chicken, fish, eggs, or tofu. One oz of lean protein is equal to:  ? 1 oz of meat, chicken, or fish.  ? 1 egg.  ? ¼ cup of tofu.  · Eat some foods each day that contain healthy fats, such as avocado, nuts, seeds, and fish.  Lifestyle  · Check your blood glucose regularly.  · Exercise regularly as told by your health care provider. This may include:  ? 150 minutes of moderate-intensity or vigorous-intensity exercise each week. This could be brisk walking, biking, or water aerobics.  ? Stretching and doing strength exercises, such as yoga or weightlifting, at least 2 times a week.  · Take medicines as told by your health care provider.  · Do not use any products that contain nicotine or tobacco, such as cigarettes and e-cigarettes. If you need help quitting, ask your health care provider.  · Work with a counselor or diabetes educator to identify strategies to manage stress and any emotional and social challenges.  Questions to ask a health care provider  · Do I need to meet with a diabetes educator?  · Do I need to meet with a dietitian?  · What number can I call if I have questions?  · When are the best times to check my blood glucose?  Where to find more  information:  · American Diabetes Association: diabetes.org  · Academy of Nutrition and Dietetics: www.eatright.org  · National Institute of Diabetes and Digestive and Kidney Diseases (NIH): www.niddk.nih.gov  Summary  · A healthy meal plan will help you control your blood glucose and maintain a healthy lifestyle.  · Working with a diet and nutrition specialist (dietitian) can help you make a meal plan that is best for you.  · Keep in mind that carbohydrates (carbs) and alcohol have immediate effects on your blood glucose levels. It is important to count carbs and to use alcohol carefully.  This information is not intended to   replace advice given to you by your health care provider. Make sure you discuss any questions you have with your health care provider.  Document Released: 03/12/2005 Document Revised: 01/13/2017 Document Reviewed: 07/20/2016  Elsevier Interactive Patient Education © 2019 [employer]

## 2018-10-25 LAB — CMP14+EGFR
ALT: 17 IU/L (ref 0–44)
AST: 30 IU/L (ref 0–40)
Albumin/Globulin Ratio: 0.8 — ABNORMAL LOW (ref 1.2–2.2)
Albumin: 2.8 g/dL — ABNORMAL LOW (ref 3.8–4.9)
Alkaline Phosphatase: 82 IU/L (ref 39–117)
BUN/Creatinine Ratio: 11 (ref 9–20)
BUN: 24 mg/dL (ref 6–24)
Bilirubin Total: 0.4 mg/dL (ref 0.0–1.2)
CO2: 24 mmol/L (ref 20–29)
Calcium: 8.5 mg/dL — ABNORMAL LOW (ref 8.7–10.2)
Chloride: 100 mmol/L (ref 96–106)
Creatinine, Ser: 2.24 mg/dL — ABNORMAL HIGH (ref 0.76–1.27)
GFR calc Af Amer: 38 mL/min/{1.73_m2} — ABNORMAL LOW (ref >59)
GFR calc non Af Amer: 32 mL/min/{1.73_m2} — ABNORMAL LOW (ref >59)
Globulin, Total: 3.5 g/dL (ref 1.5–4.5)
Glucose: 251 mg/dL — ABNORMAL HIGH (ref 65–99)
Potassium: 3.9 mmol/L (ref 3.5–5.2)
Sodium: 142 mmol/L (ref 134–144)
Total Protein: 6.3 g/dL (ref 6.0–8.5)

## 2018-10-25 LAB — CBC WITH DIFFERENTIAL/PLATELET
Basophils Absolute: 0 10*3/uL (ref 0.0–0.2)
Basos: 1 %
EOS (ABSOLUTE): 0.2 10*3/uL (ref 0.0–0.4)
Eos: 4 %
Hematocrit: 41.1 % (ref 37.5–51.0)
Hemoglobin: 13.3 g/dL (ref 13.0–17.7)
Immature Grans (Abs): 0 10*3/uL (ref 0.0–0.1)
Immature Granulocytes: 0 %
Lymphocytes Absolute: 1.4 10*3/uL (ref 0.7–3.1)
Lymphs: 30 %
MCH: 28.1 pg (ref 26.6–33.0)
MCHC: 32.4 g/dL (ref 31.5–35.7)
MCV: 87 fL (ref 79–97)
Monocytes Absolute: 0.4 10*3/uL (ref 0.1–0.9)
Monocytes: 8 %
Neutrophils Absolute: 2.6 10*3/uL (ref 1.4–7.0)
Neutrophils: 57 %
Platelets: 100 10*3/uL — CL (ref 150–450)
RBC: 4.73 x10E6/uL (ref 4.14–5.80)
RDW: 15.7 % — ABNORMAL HIGH (ref 11.6–15.4)
WBC: 4.6 10*3/uL (ref 3.4–10.8)

## 2018-10-25 LAB — MICROALBUMIN / CREATININE URINE RATIO
Creatinine, Urine: 49.3 mg/dL
Microalb/Creat Ratio: 1379 mg/g creat — ABNORMAL HIGH (ref 0–29)
Microalbumin, Urine: 679.8 ug/mL

## 2018-10-25 LAB — VITAMIN D 25 HYDROXY (VIT D DEFICIENCY, FRACTURES): Vit D, 25-Hydroxy: 19 ng/mL — ABNORMAL LOW (ref 30.0–100.0)

## 2018-10-25 LAB — LIPID PANEL
Chol/HDL Ratio: 3.1 ratio (ref 0.0–5.0)
Cholesterol, Total: 116 mg/dL (ref 100–199)
HDL: 38 mg/dL — ABNORMAL LOW (ref >39)
LDL Calculated: 30 mg/dL (ref 0–99)
Triglycerides: 238 mg/dL — ABNORMAL HIGH (ref 0–149)
VLDL Cholesterol Cal: 48 mg/dL — ABNORMAL HIGH (ref 5–40)

## 2018-10-26 ENCOUNTER — Other Ambulatory Visit: Payer: Self-pay

## 2018-10-26 ENCOUNTER — Other Ambulatory Visit: Payer: Self-pay | Admitting: Endocrinology

## 2018-10-26 LAB — TOXASSURE SELECT 13 (MW), URINE

## 2018-10-26 MED ORDER — TAMSULOSIN HCL 0.4 MG PO CAPS: ORAL_CAPSULE | ORAL | 0 refills | Status: DC

## 2018-10-27 ENCOUNTER — Other Ambulatory Visit: Payer: Self-pay | Admitting: Neurology

## 2018-10-27 ENCOUNTER — Other Ambulatory Visit: Payer: Self-pay | Admitting: Family

## 2018-10-27 DIAGNOSIS — N183 Chronic kidney disease, stage 3 unspecified: Secondary | ICD-10-CM

## 2018-10-27 DIAGNOSIS — G43719 Chronic migraine without aura, intractable, without status migrainosus: Secondary | ICD-10-CM

## 2018-10-27 MED ORDER — CIPROFLOXACIN HCL 500 MG PO TABS: 500.0000 mg | ORAL_TABLET | Freq: Two times a day (BID) | ORAL | 0 refills | Status: AC

## 2018-10-28 ENCOUNTER — Other Ambulatory Visit: Payer: Self-pay | Admitting: Family

## 2018-10-28 DIAGNOSIS — D696 Thrombocytopenia, unspecified: Secondary | ICD-10-CM

## 2018-10-31 ENCOUNTER — Ambulatory Visit (INDEPENDENT_AMBULATORY_CARE_PROVIDER_SITE_OTHER): Payer: Medicare Other | Admitting: Endocrinology

## 2018-10-31 ENCOUNTER — Other Ambulatory Visit: Payer: Self-pay

## 2018-10-31 ENCOUNTER — Telehealth: Payer: Self-pay

## 2018-10-31 DIAGNOSIS — M545 Low back pain, unspecified: Secondary | ICD-10-CM

## 2018-10-31 DIAGNOSIS — G8929 Other chronic pain: Secondary | ICD-10-CM

## 2018-10-31 DIAGNOSIS — E1165 Type 2 diabetes mellitus with hyperglycemia: Secondary | ICD-10-CM | POA: Diagnosis not present

## 2018-10-31 DIAGNOSIS — G894 Chronic pain syndrome: Secondary | ICD-10-CM

## 2018-10-31 DIAGNOSIS — Z794 Long term (current) use of insulin: Secondary | ICD-10-CM

## 2018-10-31 MED ORDER — DULAGLUTIDE 1.5 MG/0.5ML ~~LOC~~ SOAJ: SUBCUTANEOUS | 3 refills | Status: DC

## 2018-10-31 MED ORDER — EMPAGLIFLOZIN 25 MG PO TABS: 25.0000 mg | ORAL_TABLET | Freq: Every day | ORAL | 3 refills | Status: DC

## 2018-10-31 MED ORDER — INSULIN GLARGINE (1 UNIT DIAL) 300 UNIT/ML ~~LOC~~ SOPN: 400.0000 [IU] | PEN_INJECTOR | SUBCUTANEOUS | 3 refills | Status: DC

## 2018-10-31 MED ORDER — INSULIN LISPRO (1 UNIT DIAL) 100 UNIT/ML (KWIKPEN): 50.0000 [IU] | PEN_INJECTOR | Freq: Three times a day (TID) | SUBCUTANEOUS | 3 refills | Status: DC

## 2018-10-31 MED ORDER — CLOMIPHENE CITRATE 50 MG PO TABS: 25.0000 mg | ORAL_TABLET | ORAL | 3 refills | Status: AC

## 2018-10-31 NOTE — Telephone Encounter (Signed)
FYI, patient declined referral to pain clinic when contacted by them

## 2018-10-31 NOTE — Patient Instructions (Addendum)
check your blood sugar twice a day.  vary the time of day when you check, between before the 3 meals, and at bedtime.  also check if you have symptoms of your blood sugar being too high or too low.  please keep a record of the readings and bring it to your next appointment here (or you can bring the meter itself).  You can write it on any piece of paper.  please call us sooner if your blood sugar goes below 70, or if you have a lot of readings over 200. Please increase the humalog to 50 units 3 times a day (just before each meal), and:  Please continue the same other medications.  Please come back for a follow-up appointment in 2 months, when we'll be due to recheck the testosterone.
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
## 2018-11-01 ENCOUNTER — Ambulatory Visit: Payer: Medicare Other | Admitting: Family

## 2018-11-01 NOTE — Telephone Encounter (Signed)
Please call patient and see why he declined appointment to pain clinic.

## 2018-11-01 NOTE — Telephone Encounter (Signed)
Attempted to call pt - LM to call us-[id number]

## 2018-11-08 NOTE — Telephone Encounter (Signed)
Patient prefers to go to another pain clinic, not [facility].  No other reason given per his wife.

## 2018-11-08 NOTE — Telephone Encounter (Signed)
Jan can you get his appointment for the pain clinic moved to [facility]?

## 2018-11-09 NOTE — Telephone Encounter (Signed)
[name], since the patient has refused to go to [facility] we cancelled that referral.  Can you re-enter a new referral please.

## 2018-11-10 DIAGNOSIS — G8929 Other chronic pain: Secondary | ICD-10-CM | POA: Insufficient documentation

## 2018-11-10 NOTE — Telephone Encounter (Signed)
I am sorry, but read it wrong. He does not wish to go to [facility]. Can we do another referral some where else. I know he was decline from one already. Referral placed.

## 2018-11-10 NOTE — Addendum Note (Signed)
Addended by: Evelina Dun A on: 11/10/2018 08:52 AM   Modules accepted: Orders

## 2018-11-10 NOTE — Telephone Encounter (Signed)
I faxed his information to [facility] to see if they will accept him.  Yes another provider, Dr. Naaman Plummer, had already declined treating him.

## 2018-11-11 ENCOUNTER — Other Ambulatory Visit: Payer: Self-pay | Admitting: Family

## 2018-11-11 DIAGNOSIS — J449 Chronic obstructive pulmonary disease, unspecified: Secondary | ICD-10-CM | POA: Diagnosis not present

## 2018-11-11 DIAGNOSIS — G4733 Obstructive sleep apnea (adult) (pediatric): Secondary | ICD-10-CM | POA: Diagnosis not present

## 2018-11-11 DIAGNOSIS — G8194 Hemiplegia, unspecified affecting left nondominant side: Secondary | ICD-10-CM | POA: Diagnosis not present

## 2018-11-11 MED ORDER — DOXYCYCLINE HYCLATE 100 MG PO TABS: 100.0000 mg | ORAL_TABLET | Freq: Two times a day (BID) | ORAL | 0 refills | Status: DC

## 2018-11-16 ENCOUNTER — Other Ambulatory Visit: Payer: Self-pay | Admitting: Family

## 2018-11-16 DIAGNOSIS — F411 Generalized anxiety disorder: Secondary | ICD-10-CM

## 2018-11-18 ENCOUNTER — Other Ambulatory Visit: Payer: Self-pay | Admitting: Family

## 2018-11-18 DIAGNOSIS — J189 Pneumonia, unspecified organism: Secondary | ICD-10-CM

## 2018-11-19 DIAGNOSIS — M1380 Other specified arthritis, unspecified site: Secondary | ICD-10-CM | POA: Diagnosis not present

## 2018-11-19 DIAGNOSIS — G890 Central pain syndrome: Secondary | ICD-10-CM | POA: Diagnosis not present

## 2018-11-23 ENCOUNTER — Inpatient Hospital Stay (HOSPITAL_COMMUNITY): Payer: Medicare Other

## 2018-11-23 ENCOUNTER — Encounter (HOSPITAL_COMMUNITY): Payer: Self-pay | Admitting: Hematology

## 2018-11-23 ENCOUNTER — Other Ambulatory Visit: Payer: Self-pay

## 2018-11-23 ENCOUNTER — Inpatient Hospital Stay (HOSPITAL_COMMUNITY): Payer: Medicare Other | Attending: Hematology | Admitting: Hematology

## 2018-11-23 VITALS — BP 140/74 | HR 72 | Temp 98.3°F | Resp 18

## 2018-11-23 DIAGNOSIS — D696 Thrombocytopenia, unspecified: Secondary | ICD-10-CM

## 2018-11-23 DIAGNOSIS — Z7901 Long term (current) use of anticoagulants: Secondary | ICD-10-CM | POA: Diagnosis not present

## 2018-11-23 DIAGNOSIS — Z800 Family history of malignant neoplasm of digestive organs: Secondary | ICD-10-CM | POA: Insufficient documentation

## 2018-11-23 DIAGNOSIS — R61 Generalized hyperhidrosis: Secondary | ICD-10-CM | POA: Insufficient documentation

## 2018-11-23 DIAGNOSIS — Z803 Family history of malignant neoplasm of breast: Secondary | ICD-10-CM | POA: Insufficient documentation

## 2018-11-23 DIAGNOSIS — Z8261 Family history of arthritis: Secondary | ICD-10-CM | POA: Diagnosis not present

## 2018-11-23 DIAGNOSIS — R509 Fever, unspecified: Secondary | ICD-10-CM | POA: Diagnosis not present

## 2018-11-23 DIAGNOSIS — R5383 Other fatigue: Secondary | ICD-10-CM | POA: Insufficient documentation

## 2018-11-23 DIAGNOSIS — Z8249 Family history of ischemic heart disease and other diseases of the circulatory system: Secondary | ICD-10-CM | POA: Diagnosis not present

## 2018-11-23 DIAGNOSIS — Z833 Family history of diabetes mellitus: Secondary | ICD-10-CM | POA: Insufficient documentation

## 2018-11-23 DIAGNOSIS — E669 Obesity, unspecified: Secondary | ICD-10-CM | POA: Insufficient documentation

## 2018-11-23 LAB — COMPREHENSIVE METABOLIC PANEL
ALT: 21 U/L (ref 0–44)
AST: 40 U/L (ref 15–41)
Albumin: 2.8 g/dL — ABNORMAL LOW (ref 3.5–5.0)
Alkaline Phosphatase: 81 U/L (ref 38–126)
Anion gap: 11 (ref 5–15)
BUN: 32 mg/dL — ABNORMAL HIGH (ref 6–20)
CO2: 25 mmol/L (ref 22–32)
Calcium: 8.5 mg/dL — ABNORMAL LOW (ref 8.9–10.3)
Chloride: 101 mmol/L (ref 98–111)
Creatinine, Ser: 2.25 mg/dL — ABNORMAL HIGH (ref 0.61–1.24)
GFR calc Af Amer: 37 mL/min — ABNORMAL LOW (ref >60)
GFR calc non Af Amer: 32 mL/min — ABNORMAL LOW (ref >60)
Glucose, Bld: 215 mg/dL — ABNORMAL HIGH (ref 70–99)
Potassium: 4.3 mmol/L (ref 3.5–5.1)
Sodium: 137 mmol/L (ref 135–145)
Total Bilirubin: 0.8 mg/dL (ref 0.3–1.2)
Total Protein: 7.3 g/dL (ref 6.5–8.1)

## 2018-11-23 LAB — IRON AND TIBC
Iron: 63 ug/dL (ref 45–182)
Saturation Ratios: 25 % (ref 17.9–39.5)
TIBC: 252 ug/dL (ref 250–450)
UIBC: 189 ug/dL

## 2018-11-23 LAB — CBC WITH DIFFERENTIAL/PLATELET
Band Neutrophils: 0 %
Basophils Absolute: 0.1 10*3/uL (ref 0.0–0.1)
Basophils Relative: 1 %
Blasts: 0 %
Eosinophils Absolute: 0.2 10*3/uL (ref 0.0–0.5)
Eosinophils Relative: 4 %
HCT: 44.4 % (ref 39.0–52.0)
Hemoglobin: 13.8 g/dL (ref 13.0–17.0)
Lymphocytes Relative: 26 %
Lymphs Abs: 1.5 10*3/uL (ref 0.7–4.0)
MCH: 28.4 pg (ref 26.0–34.0)
MCHC: 31.1 g/dL (ref 30.0–36.0)
MCV: 91.4 fL (ref 80.0–100.0)
Metamyelocytes Relative: 0 %
Monocytes Absolute: 0.5 10*3/uL (ref 0.1–1.0)
Monocytes Relative: 8 %
Myelocytes: 0 %
Neutro Abs: 3.5 10*3/uL (ref 1.7–7.7)
Neutrophils Relative %: 61 %
Other: 0 %
Platelets: 110 10*3/uL — ABNORMAL LOW (ref 150–400)
Promyelocytes Relative: 0 %
RBC: 4.86 MIL/uL (ref 4.22–5.81)
RDW: 15.8 % — ABNORMAL HIGH (ref 11.5–15.5)
WBC: 5.8 10*3/uL (ref 4.0–10.5)
nRBC: 0 % (ref 0.0–0.2)
nRBC: 0 /100 WBC

## 2018-11-23 LAB — VITAMIN B12: Vitamin B-12: 1139 pg/mL — ABNORMAL HIGH (ref 180–914)

## 2018-11-23 LAB — SAVE SMEAR(SSMR), FOR PROVIDER SLIDE REVIEW

## 2018-11-23 LAB — FERRITIN: Ferritin: 38 ng/mL (ref 24–336)

## 2018-11-23 LAB — RETICULOCYTES
Immature Retic Fract: 22.1 % — ABNORMAL HIGH (ref 2.3–15.9)
RBC.: 4.86 MIL/uL (ref 4.22–5.81)
Retic Count, Absolute: 112.8 10*3/uL (ref 19.0–186.0)
Retic Ct Pct: 2.3 % (ref 0.4–3.1)

## 2018-11-23 LAB — FOLATE: Folate: 10.8 ng/mL (ref >5.9)
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
## 2018-11-23 NOTE — Assessment & Plan Note (Addendum)
1.  Mild thrombocytopenia: - 10/24/2018 CBC noted for thrombocytopenia: plt: 100k. No episodes of bleeding.  - Review of medical record revealed thrombocytopenia back in 2013. - CT abdomen in 2018 suggest spleenomegaly. - B symptoms with fevers, night sweat, chills present for one year. - No known exposure to Hep B or C. - Positive RA factor 2017. - Today, we discussed differential diagnosis of thrombocytopenia including but not limited to active bleeding, viral infections, vitamin/mineral deficiencies, autoimmune disorders, or myeloproliferative disorders. In his case, thrombocytopenia is most likly secondary to splenomegaly from fatty liver. We will complete labs studies today to rule out other differential diagnosis. B symptoms concerning for underlying lymphatic process.  - RTC in 2-3 weeks for follow up.  2.  Fevers and night sweats: -He does report evening fevers and night sweats for the past 9 months.  Does not report any weight loss. - I have recommended CT CAP with oral contrast.  He does have renal insufficiency.

## 2018-11-23 NOTE — Patient Instructions (Signed)
[place] at [facility] Discharge Instructions  You were seen today by Dr. Delton Coombes. He went over your history, family history and how you've been feeling lately. He will get blood work done today. He will see you back in 2-4 weeks for labs and follow up.   Thank you for choosing [place] at [facility] to provide your oncology and hematology care.  To afford each patient quality time with our provider, please arrive at least 15 minutes before your scheduled appointment time.   If you have a lab appointment with the [facility] please come in thru the  Main Entrance and check in at the main information desk  You need to re-schedule your appointment should you arrive 10 or more minutes late.  We strive to give you quality time with our providers, and arriving late affects you and other patients whose appointments are after yours.  Also, if you no show three or more times for appointments you may be dismissed from the clinic at the providers discretion.     Again, thank you for choosing [facility].  Our hope is that these requests will decrease the amount of time that you wait before being seen by our physicians.       _____________________________________________________________  Should you have questions after your visit to [facility], please contact our office at [phone] between the hours of 8:00 a.m. and 4:30 p.m.  Voicemails left after 4:00 p.m. will not be returned until the following business day.  For prescription refill requests, have your pharmacy contact our office and allow 72 hours.    Cancer Center Support Programs:   > Cancer Support Group  2nd Tuesday of the month 1pm-2pm, Journey Room

## 2018-11-24 LAB — ANTINUCLEAR ANTIBODIES, IFA: ANA Ab, IFA: NEGATIVE

## 2018-11-24 LAB — H PYLORI, IGM, IGG, IGA AB
H Pylori IgG: 0.29 Index Value (ref 0.00–0.79)
H. Pylogi, Iga Abs: 14.7 units — ABNORMAL HIGH (ref 0.0–8.9)
H. Pylogi, Igm Abs: <9 units (ref 0.0–8.9)

## 2018-11-24 LAB — RHEUMATOID FACTOR: Rheumatoid fact SerPl-aCnc: 19.5 IU/mL — ABNORMAL HIGH (ref 0.0–13.9)

## 2018-11-24 LAB — HEPATITIS B SURFACE ANTIGEN: Hepatitis B Surface Ag: NEGATIVE

## 2018-11-24 LAB — HEPATITIS B SURFACE ANTIBODY,QUALITATIVE: Hep B S Ab: NONREACTIVE

## 2018-11-26 LAB — COPPER, SERUM: Copper: 141 ug/dL (ref 72–166)

## 2018-11-30 ENCOUNTER — Other Ambulatory Visit: Payer: Self-pay | Admitting: Family

## 2018-11-30 DIAGNOSIS — N189 Chronic kidney disease, unspecified: Secondary | ICD-10-CM

## 2018-12-02 ENCOUNTER — Other Ambulatory Visit: Payer: Self-pay

## 2018-12-05 ENCOUNTER — Ambulatory Visit: Payer: Medicare Other | Admitting: Family

## 2018-12-05 ENCOUNTER — Other Ambulatory Visit: Payer: Self-pay | Admitting: *Deleted

## 2018-12-07 DIAGNOSIS — M19011 Primary osteoarthritis, right shoulder: Secondary | ICD-10-CM | POA: Diagnosis not present

## 2018-12-07 DIAGNOSIS — M19012 Primary osteoarthritis, left shoulder: Secondary | ICD-10-CM | POA: Diagnosis not present

## 2018-12-11 ENCOUNTER — Other Ambulatory Visit: Payer: Self-pay | Admitting: Family

## 2018-12-11 DIAGNOSIS — J189 Pneumonia, unspecified organism: Secondary | ICD-10-CM

## 2018-12-12 DIAGNOSIS — J449 Chronic obstructive pulmonary disease, unspecified: Secondary | ICD-10-CM | POA: Diagnosis not present

## 2018-12-12 DIAGNOSIS — G4733 Obstructive sleep apnea (adult) (pediatric): Secondary | ICD-10-CM | POA: Diagnosis not present

## 2018-12-12 DIAGNOSIS — G8194 Hemiplegia, unspecified affecting left nondominant side: Secondary | ICD-10-CM | POA: Diagnosis not present

## 2018-12-14 ENCOUNTER — Ambulatory Visit (HOSPITAL_COMMUNITY): Payer: Medicare Other

## 2018-12-20 ENCOUNTER — Ambulatory Visit (INDEPENDENT_AMBULATORY_CARE_PROVIDER_SITE_OTHER): Payer: Medicare Other | Admitting: Family

## 2018-12-20 ENCOUNTER — Encounter: Payer: Self-pay | Admitting: Family

## 2018-12-20 ENCOUNTER — Other Ambulatory Visit: Payer: Self-pay

## 2018-12-20 VITALS — BP 137/89 | HR 73 | Temp 99.9°F

## 2018-12-20 DIAGNOSIS — Z86711 Personal history of pulmonary embolism: Secondary | ICD-10-CM

## 2018-12-20 DIAGNOSIS — M1380 Other specified arthritis, unspecified site: Secondary | ICD-10-CM | POA: Diagnosis not present

## 2018-12-20 DIAGNOSIS — Z7901 Long term (current) use of anticoagulants: Secondary | ICD-10-CM

## 2018-12-20 DIAGNOSIS — D696 Thrombocytopenia, unspecified: Secondary | ICD-10-CM | POA: Diagnosis not present

## 2018-12-20 DIAGNOSIS — Z86718 Personal history of other venous thrombosis and embolism: Secondary | ICD-10-CM

## 2018-12-20 DIAGNOSIS — J810 Acute pulmonary edema: Secondary | ICD-10-CM

## 2018-12-20 DIAGNOSIS — G890 Central pain syndrome: Secondary | ICD-10-CM | POA: Diagnosis not present

## 2018-12-20 LAB — COAGUCHEK XS/INR WAIVED
INR: 2.2 — ABNORMAL HIGH (ref 0.9–1.1)
Prothrombin Time: 26.9 s

## 2018-12-20 NOTE — Patient Instructions (Signed)
Chronic Lymphocytic Leukemia Chronic lymphocytic leukemia (CLL) is a type of cancer of the blood cells and soft tissue inside bones (bone marrow). CLL happens when your bone marrow makes too many abnormal white blood cells. The cells, called leukemia cells, do not function normally and accumulate in the blood. Eventually they crowd out other healthy blood cells. CLL usually gets worse slowly. It can cause complications in your organs, such as in your spleen. It can also weaken your immune system and lead to conditions in which your immune system attacks your body (autoimmune conditions). What are the causes? The cause of this condition is not known. What increases the risk? You are more likely to develop this condition if:  You are older than 50 years.  You are white.  You are male.  You have a family history of CLL or other cancers of the lymph system.  You are of [place] or [place] descent.  You have been exposed to certain chemicals, such as: ? Insecticides. ? Herbicides. These include Agent Orange, a herbicide used in the [place] war. What are the signs or symptoms? At first, there may be no symptoms. After a while, symptoms may include:  Feeling more tired than usual, even after rest.  Unplanned weight loss.  Heavy sweating at night.  Fever.  Shortness of breath.  Paleness.  Painless, swollen lymph nodes.  A feeling of fullness in the upper left part of the abdomen.  Easy bruising or bleeding.  Frequent infections. How is this diagnosed? This condition is diagnosed based on:  A physical exam to check for an enlarged spleen, liver, or lymph nodes.  Blood and bone marrow tests to check for leukemia cells. Tests may include: ? A complete blood count. ? Flow cytometry. This method uses light sensors and dyes to figure out the number of cells as well as their size, structure, and general health. ? Immunophenotyping. This method is used to  diagnose leukemia by identifying specific antibodies found in white blood cells. The test is used when a complete blood count shows the presence of immature cells or a high number of white blood cells. ? Fluorescence in situ hybridization (FISH). This test is used to examine defects in chromosomes and how those defects affect the functioning of the cell. Results from a [place] test will be used to determine treatment and assess the outcome of that treatment.  A CT scan to check for swelling or anything abnormal in your spleen, liver, and lymph nodes. How is this treated? Treatment for this condition depends on the stage of the leukemia and whether you have symptoms. Treatment may include:  Observation.  Targeted drugs. These are medicines that interfere with the way leukemia cells grow and multiply. They identify and attack specific leukemia cells without harming normal cells.  Chemotherapy drugs. These are medicines that kill leukemia cells that are multiplying quickly.  Radiation.  Surgery to remove the spleen.  Biological therapy (immunotherapy). This treatment boosts the ability of your immune system to fight the leukemia cells.  Bone marrow or peripheral blood stem cell transplant. This treatment replaces your own bone marrow or stem cells with bone marrow or stem cells from a donor. This treatment may be done after you receive very high doses of chemotherapy or radiation that kill your stem cells and bone marrow.  New treatments through clinical trials. Additional medicines may be needed to help manage symptoms. Follow these instructions at home: Medicines  Take over-the-counter and prescription medicines  only as told by your health care provider.  If you were prescribed an antibiotic medicine, take it as told by your health care provider. Do not stop taking the antibiotic even if you start to feel better. If you are on chemotherapy:  Wash your hands often, especially before meals,  after being outside, and after using the toilet. Have visitors do the same.  Keep your teeth and gums clean and well cared for. Use soft toothbrushes.  Protect your skin from the sun by using sunscreen and wearing protective clothing. General instructions  Avoid contact sports or other rough activities. Ask your health care provider what activities are safe for you.  Avoid crowded places and people who are sick.  Tell your cancer care team if you develop side effects. They may be able to recommend ways to relieve them.  Try to eat regular, healthy meals. Some of your treatments might affect your appetite.  Find healthy ways of coping with stress, such as by doing yoga or meditation or by joining a support group.  Keep all follow-up visits as told by your health care provider. This is important. Where to find more information  American Cancer Society: www.cancer.org  Leukemia and Lymphoma Society: PreviewPal.pl  National Cancer Institute ([place]): www.cancer.gov Contact a health care provider if:  You have pain in your abdomen.  You develop new bruises that are getting bigger.  You have painful or more swollen lymph nodes.  You develop bleeding from your gums or nose.  You cannot eat or drink without vomiting.  You feel lightheaded. Get help right away if:  You have a fever or chills.  You develop chest pain.  You have trouble breathing or feel short of breath.  You faint.  There is blood in your urine or stool.  You have excessive bleeding.  You have any symptoms that are severe or uncontrolled. Summary  Chronic lymphocytic leukemia (CLL) is a type of cancer of the blood cells and bone marrow.  This condition can cause an enlarged spleen, swollen lymph nodes, a weakened immune system, low red blood cell and platelets counts, and autoimmune conditions.  Treatment for this condition depends on the stage of the cancer and whether you have symptoms.  Chemotherapy,  radiation, surgery to remove the spleen, and bone marrow transplant are some of the ways to treat CLL. This information is not intended to replace advice given to you by your health care provider. Make sure you discuss any questions you have with your health care provider. Document Released: 11/01/2008 Document Revised: 05/27/2016 Document Reviewed: 05/27/2016 Elsevier Interactive Patient Education  2019 [employer].

## 2018-12-20 NOTE — Progress Notes (Signed)
   Subjective:    Patient ID: [name], male    DOB: [birth year], 53 y.o.   MRN: [id number]  Chief Complaint  Patient presents with  . recheck protime    HPI PT presents to the office today to recheck INR. He is currently taking warfarin for hx of DVT and Hx PE. He denies any missed doses. See anticoagulation flowsheet.   He is currently followed by Oncologists for thrombocytopenia and getting worked up to rule out CLL.   Review of Systems  Constitutional: Positive for fatigue.  Cardiovascular: Positive for leg swelling.  All other systems reviewed and are negative.      Objective:   Physical Exam Vitals signs reviewed.  Constitutional:      General: He is not in acute distress.    Appearance: He is well-developed. He is obese.  HENT:     Head: Normocephalic.     Right Ear: Tympanic membrane normal.     Left Ear: Tympanic membrane normal.  Eyes:     General:        Right eye: No discharge.        Left eye: No discharge.     Pupils: Pupils are equal, round, and reactive to light.  Neck:     Musculoskeletal: Normal range of motion and neck supple.     Thyroid: No thyromegaly.  Cardiovascular:     Rate and Rhythm: Normal rate and regular rhythm.     Heart sounds: Normal heart sounds. No murmur.  Pulmonary:     Effort: Pulmonary effort is normal. No respiratory distress.     Breath sounds: Normal breath sounds. No wheezing.  Abdominal:     General: Bowel sounds are normal. There is no distension.     Palpations: Abdomen is soft.     Tenderness: There is no abdominal tenderness.  Musculoskeletal:        General: No tenderness.     Right lower leg: Edema (trace) present.     Left lower leg: Edema (trace) present.     Comments: In wheelchair, generalized weakness, pain in lower lower back with flexion and extension.   Skin:    General: Skin is warm and dry.     Findings: No erythema or rash.  Neurological:     Mental Status: He is alert and oriented to person,  place, and time.     Cranial Nerves: No cranial nerve deficit.     Deep Tendon Reflexes: Reflexes are normal and symmetric.  Psychiatric:        Behavior: Behavior normal.        Thought Content: Thought content normal.        Judgment: Judgment normal.       BP 137/89   Pulse 73   Temp 99.9 F (37.7 C) (Oral)      Assessment & Plan:  [name] comes in today with chief complaint of recheck protime   Diagnosis and orders addressed:  1. Chronic anticoagulation Description   Continue taking 1 1/2 tablets on Wednesdays and 1 tablet all other days of the week.  INR today was 2.2(at goal!) and your goal is 2.0-3.0      - CoaguChek XS/INR Waived  2. Postoperative pulmonary edema (HCC)  3. History of DVT (deep vein thrombosis)  4. Hx pulmonary embolism  5. Thrombocytopenia ([place]) Keep Oncologists follow up    Follow up plan: 1 month   Patrick Dun, FNP

## 2018-12-21 ENCOUNTER — Ambulatory Visit (HOSPITAL_COMMUNITY): Payer: Medicare Other | Admitting: Hematology

## 2018-12-28 ENCOUNTER — Ambulatory Visit (HOSPITAL_COMMUNITY)
Admission: RE | Admit: 2018-12-28 | Discharge: 2018-12-28 | Disposition: A | Discharge status: Home / Self Care | Payer: Medicare Other | Source: Ambulatory Visit | Attending: Hematology | Admitting: Hematology

## 2018-12-28 ENCOUNTER — Other Ambulatory Visit: Payer: Self-pay

## 2018-12-28 DIAGNOSIS — R161 Splenomegaly, not elsewhere classified: Secondary | ICD-10-CM | POA: Insufficient documentation

## 2018-12-28 DIAGNOSIS — R61 Generalized hyperhidrosis: Secondary | ICD-10-CM | POA: Insufficient documentation

## 2018-12-28 DIAGNOSIS — J9811 Atelectasis: Secondary | ICD-10-CM | POA: Diagnosis not present

## 2018-12-28 DIAGNOSIS — R590 Localized enlarged lymph nodes: Secondary | ICD-10-CM | POA: Diagnosis not present

## 2018-12-28 DIAGNOSIS — R509 Fever, unspecified: Secondary | ICD-10-CM | POA: Insufficient documentation

## 2018-12-28 DIAGNOSIS — I700 Atherosclerosis of aorta: Secondary | ICD-10-CM | POA: Diagnosis not present

## 2018-12-28 DIAGNOSIS — K760 Fatty (change of) liver, not elsewhere classified: Secondary | ICD-10-CM | POA: Diagnosis not present

## 2019-01-02 ENCOUNTER — Encounter (HOSPITAL_COMMUNITY): Payer: Self-pay | Admitting: Hematology

## 2019-01-02 ENCOUNTER — Inpatient Hospital Stay (HOSPITAL_COMMUNITY): Payer: Medicare Other | Attending: Hematology | Admitting: Hematology

## 2019-01-02 ENCOUNTER — Other Ambulatory Visit: Payer: Self-pay

## 2019-01-02 VITALS — BP 106/68 | HR 83 | Temp 97.9°F | Resp 20

## 2019-01-02 DIAGNOSIS — Z833 Family history of diabetes mellitus: Secondary | ICD-10-CM | POA: Diagnosis not present

## 2019-01-02 DIAGNOSIS — D696 Thrombocytopenia, unspecified: Secondary | ICD-10-CM | POA: Insufficient documentation

## 2019-01-02 DIAGNOSIS — G479 Sleep disorder, unspecified: Secondary | ICD-10-CM | POA: Diagnosis not present

## 2019-01-02 DIAGNOSIS — R5383 Other fatigue: Secondary | ICD-10-CM | POA: Diagnosis not present

## 2019-01-02 DIAGNOSIS — Z8261 Family history of arthritis: Secondary | ICD-10-CM | POA: Insufficient documentation

## 2019-01-02 DIAGNOSIS — R450 Nervousness: Secondary | ICD-10-CM | POA: Diagnosis not present

## 2019-01-02 DIAGNOSIS — M7989 Other specified soft tissue disorders: Secondary | ICD-10-CM

## 2019-01-02 DIAGNOSIS — R509 Fever, unspecified: Secondary | ICD-10-CM | POA: Insufficient documentation

## 2019-01-02 DIAGNOSIS — K760 Fatty (change of) liver, not elsewhere classified: Secondary | ICD-10-CM | POA: Diagnosis not present

## 2019-01-02 DIAGNOSIS — R161 Splenomegaly, not elsewhere classified: Secondary | ICD-10-CM | POA: Diagnosis not present

## 2019-01-02 DIAGNOSIS — Z800 Family history of malignant neoplasm of digestive organs: Secondary | ICD-10-CM | POA: Diagnosis not present

## 2019-01-02 DIAGNOSIS — G894 Chronic pain syndrome: Secondary | ICD-10-CM

## 2019-01-02 DIAGNOSIS — Z803 Family history of malignant neoplasm of breast: Secondary | ICD-10-CM | POA: Diagnosis not present

## 2019-01-02 DIAGNOSIS — M549 Dorsalgia, unspecified: Secondary | ICD-10-CM | POA: Diagnosis not present

## 2019-01-02 DIAGNOSIS — E1165 Type 2 diabetes mellitus with hyperglycemia: Secondary | ICD-10-CM

## 2019-01-02 DIAGNOSIS — R0602 Shortness of breath: Secondary | ICD-10-CM | POA: Diagnosis not present

## 2019-01-02 DIAGNOSIS — Z8249 Family history of ischemic heart disease and other diseases of the circulatory system: Secondary | ICD-10-CM | POA: Diagnosis not present

## 2019-01-02 DIAGNOSIS — Z79899 Other long term (current) drug therapy: Secondary | ICD-10-CM | POA: Insufficient documentation

## 2019-01-02 DIAGNOSIS — R61 Generalized hyperhidrosis: Secondary | ICD-10-CM | POA: Insufficient documentation

## 2019-01-02 DIAGNOSIS — G40909 Epilepsy, unspecified, not intractable, without status epilepticus: Secondary | ICD-10-CM | POA: Insufficient documentation

## 2019-01-02 NOTE — Assessment & Plan Note (Signed)
1.  Mild thrombocytopenia: - CBC on 10/24/2018 shows platelet count of 100 K. -No episodes of easy bruising or bleeding. -Review of medical record reveals thrombocytopenia mild, back in 2013. - We reviewed blood work.  Nutritional work-up including folate, copper, B12 and ferritin were normal.  Hepatitis panel was normal.  Rheumatoid factor was slightly elevated.  ANA was negative.  H. pylori testing was negative.  Repeat platelet count was 110.  Normal white count and hemoglobin. 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  We will continue to monitor it closely.  No active intervention necessary.  2.  Fevers and night sweats: - He is reporting fevers of 102 degrees intermittently, which happens at least once daily. -We reviewed results of the CT CAP from 12/28/2018 which showed splenomegaly measuring 19.6 cm stable since 2018.  Stable upper normal to mildly enlarged lymph nodes in the upper abdomen compared to 05/18/2017.  Interval stability suggests benign etiology.  Hepatic steatosis present. -Because of his continuing fevers, I have recommended doing a PET CT scan to evaluate any lymphoma in the spleen. -I will see him back after the PET scan.

## 2019-01-02 NOTE — Patient Instructions (Addendum)
[facility] at [facility] Discharge Instructions  You were seen today by Dr. Katragadda. He went over your recent lab results. He will see you back in 4 months for labs and follow up.   Thank you for choosing [facility] at [facility] to provide your oncology and hematology care.  To afford each patient quality time with our provider, please arrive at least 15 minutes before your scheduled appointment time.   If you have a lab appointment with the Cancer Center please come in thru the  Main Entrance and check in at the main information desk  You need to re-schedule your appointment should you arrive 10 or more minutes late.  We strive to give you quality time with our providers, and arriving late affects you and other patients whose appointments are after yours.  Also, if you no show three or more times for appointments you may be dismissed from the clinic at the providers discretion.     Again, thank you for choosing [facility].  Our hope is that these requests will decrease the amount of time that you wait before being seen by our physicians.       _____________________________________________________________  Should you have questions after your visit to [facility], please contact our office at [phone] between the hours of 8:00 a.m. and 4:30 p.m.  Voicemails left after 4:00 p.m. will not be returned until the following business day.  For prescription refill requests, have your pharmacy contact our office and allow 72 hours.    Cancer Center Support Programs:   > Cancer Support Group  2nd Tuesday of the month 1pm-2pm, Journey Room    
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
## 2019-01-04 ENCOUNTER — Telehealth: Payer: Self-pay | Admitting: Family

## 2019-01-04 NOTE — Telephone Encounter (Signed)
FYI reach out o [facility] for pain management patient declined appointment with them.

## 2019-01-05 ENCOUNTER — Telehealth: Payer: Self-pay | Admitting: Family

## 2019-01-05 NOTE — Telephone Encounter (Signed)
Yes I can give another month.

## 2019-01-05 NOTE — Telephone Encounter (Signed)
Spoke with patient and he declined appointment with them because he got in with [facility]. Pt wife is asking if Alyse Low will refill pain patches for one month.

## 2019-01-05 NOTE — Telephone Encounter (Signed)
Can we follow up on this? I can not continue his pain medication per our office policy. I do not want him to end of without it.

## 2019-01-07 ENCOUNTER — Other Ambulatory Visit: Payer: Self-pay | Admitting: Family

## 2019-01-07 DIAGNOSIS — J189 Pneumonia, unspecified organism: Secondary | ICD-10-CM

## 2019-01-09 NOTE — Telephone Encounter (Signed)
Wife aware

## 2019-01-10 ENCOUNTER — Encounter (HOSPITAL_COMMUNITY): Payer: Medicare Other

## 2019-01-11 DIAGNOSIS — J449 Chronic obstructive pulmonary disease, unspecified: Secondary | ICD-10-CM | POA: Diagnosis not present

## 2019-01-11 DIAGNOSIS — G4733 Obstructive sleep apnea (adult) (pediatric): Secondary | ICD-10-CM | POA: Diagnosis not present

## 2019-01-11 DIAGNOSIS — G8194 Hemiplegia, unspecified affecting left nondominant side: Secondary | ICD-10-CM | POA: Diagnosis not present

## 2019-01-12 ENCOUNTER — Other Ambulatory Visit (HOSPITAL_COMMUNITY): Payer: Self-pay | Admitting: Hematology

## 2019-01-12 DIAGNOSIS — R591 Generalized enlarged lymph nodes: Secondary | ICD-10-CM

## 2019-01-15 ENCOUNTER — Other Ambulatory Visit: Payer: Self-pay | Admitting: Family

## 2019-01-16 ENCOUNTER — Ambulatory Visit (HOSPITAL_COMMUNITY): Payer: Medicare Other | Admitting: Hematology

## 2019-01-17 DIAGNOSIS — I129 Hypertensive chronic kidney disease with stage 1 through stage 4 chronic kidney disease, or unspecified chronic kidney disease: Secondary | ICD-10-CM | POA: Diagnosis not present

## 2019-01-17 DIAGNOSIS — E1122 Type 2 diabetes mellitus with diabetic chronic kidney disease: Secondary | ICD-10-CM | POA: Diagnosis not present

## 2019-01-17 DIAGNOSIS — N183 Chronic kidney disease, stage 3 (moderate): Secondary | ICD-10-CM | POA: Diagnosis not present

## 2019-01-18 ENCOUNTER — Other Ambulatory Visit: Payer: Self-pay | Admitting: Family

## 2019-01-18 DIAGNOSIS — Z0289 Encounter for other administrative examinations: Secondary | ICD-10-CM

## 2019-01-18 DIAGNOSIS — G894 Chronic pain syndrome: Secondary | ICD-10-CM

## 2019-01-18 DIAGNOSIS — F1120 Opioid dependence, uncomplicated: Secondary | ICD-10-CM

## 2019-01-19 MED ORDER — HYDROCODONE-ACETAMINOPHEN 7.5-325 MG PO TABS: 1.0000 | ORAL_TABLET | Freq: Four times a day (QID) | ORAL | 0 refills | Status: DC | PRN

## 2019-01-19 MED ORDER — FENTANYL 75 MCG/HR TD PT72: 1.0000 | MEDICATED_PATCH | TRANSDERMAL | 0 refills | Status: DC

## 2019-01-19 NOTE — Telephone Encounter (Signed)
I have resent his pain medication in for one month until he can get into pain clinic. Make sure to keep these appts as I will not be able to continue.

## 2019-01-23 ENCOUNTER — Other Ambulatory Visit: Payer: Self-pay

## 2019-01-24 ENCOUNTER — Ambulatory Visit (INDEPENDENT_AMBULATORY_CARE_PROVIDER_SITE_OTHER): Payer: Medicare Other | Admitting: Family

## 2019-01-24 ENCOUNTER — Encounter: Payer: Self-pay | Admitting: Family

## 2019-01-24 VITALS — BP 134/92 | HR 91 | Temp 99.1°F

## 2019-01-24 DIAGNOSIS — G4733 Obstructive sleep apnea (adult) (pediatric): Secondary | ICD-10-CM

## 2019-01-24 DIAGNOSIS — G40909 Epilepsy, unspecified, not intractable, without status epilepticus: Secondary | ICD-10-CM

## 2019-01-24 DIAGNOSIS — E785 Hyperlipidemia, unspecified: Secondary | ICD-10-CM

## 2019-01-24 DIAGNOSIS — Z794 Long term (current) use of insulin: Secondary | ICD-10-CM | POA: Diagnosis not present

## 2019-01-24 DIAGNOSIS — E662 Morbid (severe) obesity with alveolar hypoventilation: Secondary | ICD-10-CM

## 2019-01-24 DIAGNOSIS — E559 Vitamin D deficiency, unspecified: Secondary | ICD-10-CM

## 2019-01-24 DIAGNOSIS — G43719 Chronic migraine without aura, intractable, without status migrainosus: Secondary | ICD-10-CM | POA: Diagnosis not present

## 2019-01-24 DIAGNOSIS — G43711 Chronic migraine without aura, intractable, with status migrainosus: Secondary | ICD-10-CM | POA: Diagnosis not present

## 2019-01-24 DIAGNOSIS — Z7901 Long term (current) use of anticoagulants: Secondary | ICD-10-CM

## 2019-01-24 DIAGNOSIS — Z86711 Personal history of pulmonary embolism: Secondary | ICD-10-CM

## 2019-01-24 DIAGNOSIS — E1169 Type 2 diabetes mellitus with other specified complication: Secondary | ICD-10-CM

## 2019-01-24 DIAGNOSIS — N401 Enlarged prostate with lower urinary tract symptoms: Secondary | ICD-10-CM

## 2019-01-24 DIAGNOSIS — J810 Acute pulmonary edema: Secondary | ICD-10-CM

## 2019-01-24 DIAGNOSIS — D649 Anemia, unspecified: Secondary | ICD-10-CM

## 2019-01-24 DIAGNOSIS — F411 Generalized anxiety disorder: Secondary | ICD-10-CM

## 2019-01-24 DIAGNOSIS — F331 Major depressive disorder, recurrent, moderate: Secondary | ICD-10-CM

## 2019-01-24 DIAGNOSIS — E1165 Type 2 diabetes mellitus with hyperglycemia: Secondary | ICD-10-CM | POA: Diagnosis not present

## 2019-01-24 DIAGNOSIS — R0902 Hypoxemia: Secondary | ICD-10-CM

## 2019-01-24 LAB — BAYER DCA HB A1C WAIVED: HB A1C (BAYER DCA - WAIVED): 8.8 % — ABNORMAL HIGH (ref <7.0)

## 2019-01-24 LAB — COAGUCHEK XS/INR WAIVED
INR: 3.4 — ABNORMAL HIGH (ref 0.9–1.1)
Prothrombin Time: 40.4 s

## 2019-01-24 NOTE — Patient Instructions (Signed)
Chronic Kidney Disease, Adult Chronic kidney disease (CKD) occurs when the kidneys become damaged slowly over a long period of time. The kidneys are a pair of organs that do many important jobs in the body, including:  Removing waste and extra fluid from the blood to make urine.  Making hormones that maintain the amount of fluid in tissues and blood vessels.  Maintaining the right amount of fluids and chemicals in the body. A small amount of kidney damage may not cause problems, but a large amount of damage may make it hard or impossible for the kidneys to work the way they should. If steps are not taken to slow down kidney damage or to stop it from getting worse, the kidneys may stop working permanently (end-stage renal disease or ESRD). Most of the time, CKD does not go away, but it can often be controlled. People who have CKD are usually able to live normal lives. What are the causes? The most common causes of this condition are diabetes and high blood pressure (hypertension). Other causes include:  Heart and blood vessel (cardiovascular) disease.  Kidney diseases, such as: ? Glomerulonephritis. ? Interstitial nephritis. ? Polycystic kidney disease. ? Renal vascular disease.  Diseases that affect the immune system.  Genetic diseases.  Medicines that damage the kidneys, such as anti-inflammatory medicines.  Being around or being in contact with poisonous (toxic) substances.  A kidney or urinary infection that occurs again and again (recurs).  Vasculitis. This is swelling or inflammation of the blood vessels.  A problem with urine flow that may be caused by: ? Cancer. ? Having kidney stones more than one time. ? An enlarged prostate, in males. What increases the risk? You are more likely to develop this condition if you:  Are older than age 60.  Are male.  Are African-American, Hispanic, Asian, Pacific Islander, or American Indian.  Are a current or former smoker.   Are obese.  Have a family history of kidney disease or failure.  Often take medicines that are damaging to the kidneys. What are the signs or symptoms? Symptoms of this condition include:  Swelling (edema) of the face, legs, ankles, or feet.  Tiredness (lethargy) and having less energy.  Nausea or vomiting.  Confusion or trouble concentrating.  Problems with urination, such as: ? Painful or burning feeling during urination. ? Decreased urine production. ? Frequent urination, especially at night. ? Bloody urine.  Muscle twitches and cramps, especially in the legs.  Shortness of breath.  Weakness.  Loss of appetite.  Metallic taste in the mouth.  Trouble sleeping.  Dry, itchy skin.  A low blood count (anemia).  Pale lining of the eyelids and surface of the eye (conjunctiva). Symptoms develop slowly and may not be obvious until the kidney damage becomes severe. It is possible to have kidney disease for years without having any symptoms. How is this diagnosed? This condition may be diagnosed based on:  Blood tests.  Urine tests.  Imaging tests, such as an ultrasound or CT scan.  A test in which a sample of tissue is removed from the kidneys to be examined under a microscope (kidney biopsy). These test results will help your health care provider determine how serious the CKD is. How is this treated? There is no cure for most cases of this condition, but treatment usually relieves symptoms and prevents or slows the progression of the disease. Treatment may include:  Making diet changes, which may require you to avoid alcohol, salty foods (sodium),   and foods that are high in potassium, calcium, and protein.  Medicines: ? To lower blood pressure. ? To control blood glucose. ? To relieve anemia. ? To relieve swelling. ? To protect your bones. ? To improve the balance of electrolytes in your blood.  Removing toxic waste from the body through types of dialysis, if  the kidneys can no longer do their job (kidney failure).  Managing any other conditions that are causing your CKD or making it worse. Follow these instructions at home: Medicines  Take over-the-counter and prescription medicines only as told by your health care provider. The dose of some medicines that you take may need to be adjusted.  Do not take any new medicines unless approved by your health care provider. Many medicines can worsen your kidney damage.  Do not take any vitamin and mineral supplements unless approved by your health care provider. Many nutritional supplements can worsen your kidney damage. General instructions  Follow your prescribed diet as told by your health care provider.  Do not use any products that contain nicotine or tobacco, such as cigarettes and e-cigarettes. If you need help quitting, ask your health care provider.  Monitor and track your blood pressure at home. Report changes in your blood pressure as told by your health care provider.  If you are being treated for diabetes, monitor and track your blood sugar (blood glucose) levels as told by your health care provider.  Maintain a healthy weight. If you need help with this, ask your health care provider.  Start or continue an exercise plan. Exercise at least 30 minutes a day, 5 days a week.  Keep your immunizations up to date as told by your health care provider.  Keep all follow-up visits as told by your health care provider. This is important. Where to find more information  American Association of Kidney Patients: www.aakp.org  National Kidney Foundation: www.kidney.org  American Kidney Fund: www.akfinc.org  [facility]: www.lifeoptions.org and www.kidneyschool.org Contact a health care provider if:  Your symptoms get worse.  You develop new symptoms. Get help right away if:  You develop symptoms of ESRD, which include: ? Headaches. ? Numbness in the hands or  feet. ? Easy bruising. ? Frequent hiccups. ? Chest pain. ? Shortness of breath. ? Lack of menstruation, in women.  You have a fever.  You have decreased urine production.  You have pain or bleeding when you urinate. Summary  Chronic kidney disease (CKD) occurs when the kidneys become damaged slowly over a long period of time.  The most common causes of this condition are diabetes and high blood pressure (hypertension).  There is no cure for most cases of this condition, but treatment usually relieves symptoms and prevents or slows the progression of the disease. Treatment may include a combination of medicines and lifestyle changes. This information is not intended to replace advice given to you by your health care provider. Make sure you discuss any questions you have with your health care provider. Document Released: 03/24/2008 Document Revised: 05/28/2017 Document Reviewed: 07/23/2016 Elsevier Patient Education  2020 [employer]  

## 2019-01-24 NOTE — Progress Notes (Addendum)
Subjective:    Patient ID: [name], male    DOB: April 16, 1966, 53 y.o.   MRN: [id number]  Chief Complaint  Patient presents with  . protime check  . Diabetes  . oxygen low   Pt presents to the office today for chronic follow up. Pt is followed by neurologistsannuallyfor migraines and seizures.PT is followed by Ortho for osteoarthritis every 3 months.Endocrinologists. Oncologists for  Thrombocytopenia and lymphadenopathy. He is scheduled for CT scan  Followed by Nephrologists for CKD.  Has appointment with Pain Clinic next month.  Diabetes He presents for his follow-up diabetic visit. He has type 2 diabetes mellitus. Hypoglycemia symptoms include headaches and nervousness/anxiousness. Associated symptoms include foot paresthesias and weakness. Pertinent negatives for diabetes include no blurred vision. There are no hypoglycemic complications. Symptoms are stable. Diabetic complications include heart disease. Risk factors for coronary artery disease include diabetes mellitus, dyslipidemia, family history, obesity, male sex and hypertension. He is following a generally unhealthy diet. His overall blood glucose range is 130-140 mg/dl. Eye exam is not current.  Headache  This is a chronic problem. The current episode started more than 1 year ago. The problem has been waxing and waning. Associated symptoms include back pain, insomnia and weakness. Pertinent negatives include no blurred vision or coughing. His past medical history is significant for obesity.  Gastroesophageal Reflux He complains of heartburn and a hoarse voice. He reports no belching or no coughing. This is a chronic problem. The problem occurs occasionally. The symptoms are aggravated by certain foods. Risk factors include obesity and lack of exercise. He has tried a PPI for the symptoms. The treatment provided moderate relief.  Hyperlipidemia This is a chronic problem. The current episode started more than 1 year ago.  The problem is controlled. Recent lipid tests were reviewed and are normal. Exacerbating diseases include obesity. Associated symptoms include shortness of breath. Current antihyperlipidemic treatment includes statins. The current treatment provides moderate improvement of lipids. Risk factors for coronary artery disease include dyslipidemia, diabetes mellitus, male sex, hypertension and a sedentary lifestyle.  Benign Prostatic Hypertrophy This is a chronic problem. The current episode started more than 1 year ago. Irritative symptoms include nocturia. Obstructive symptoms include an intermittent stream.  Anxiety Presents for follow-up visit. Symptoms include decreased concentration, depressed mood, excessive worry, insomnia, irritability, nervous/anxious behavior, restlessness and shortness of breath. Symptoms occur most days. The severity of symptoms is moderate. The quality of sleep is good.   His past medical history is significant for anemia.  Back Pain This is a chronic problem. The current episode started more than 1 year ago. The problem occurs intermittently. The problem has been waxing and waning since onset. The pain is present in the lumbar spine. The quality of the pain is described as aching. The pain is moderate. Associated symptoms include headaches and weakness.  Depression        This is a chronic problem.  The current episode started more than 1 year ago.   The onset quality is gradual.   The problem occurs intermittently.  The problem has been waxing and waning since onset.  Associated symptoms include decreased concentration, insomnia, irritable, restlessness, decreased interest, headaches and sad.  Associated symptoms include no helplessness and no hopelessness.  Past treatments include SNRIs - Serotonin and norepinephrine reuptake inhibitors.  Compliance with treatment is good.  Past medical history includes anxiety.   Anemia Presents for follow-up visit. Symptoms include  bruises/bleeds easily and malaise/fatigue.  HX PE and DVT Pt  taking warfarin. See anticoagulation flow sheet.    Review of Systems  Constitutional: Positive for irritability and malaise/fatigue.  HENT: Positive for hoarse voice.   Eyes: Negative for blurred vision.  Respiratory: Positive for shortness of breath. Negative for cough.   Gastrointestinal: Positive for heartburn.  Genitourinary: Positive for nocturia.  Musculoskeletal: Positive for back pain.  Neurological: Positive for weakness and headaches.  Hematological: Bruises/bleeds easily.  Psychiatric/Behavioral: Positive for decreased concentration and depression. The patient is nervous/anxious and has insomnia.   All other systems reviewed and are negative.      Objective:   Physical Exam Vitals signs reviewed.  Constitutional:      General: He is irritable. He is not in acute distress.    Appearance: He is well-developed. He is obese.  HENT:     Head: Normocephalic.     Right Ear: Tympanic membrane normal.     Left Ear: Tympanic membrane normal.  Eyes:     General:        Right eye: No discharge.        Left eye: No discharge.     Pupils: Pupils are equal, round, and reactive to light.  Neck:     Musculoskeletal: Normal range of motion and neck supple.     Thyroid: No thyromegaly.  Cardiovascular:     Rate and Rhythm: Normal rate and regular rhythm.     Heart sounds: Normal heart sounds. No murmur.  Pulmonary:     Effort: Pulmonary effort is normal. No respiratory distress.     Breath sounds: Decreased breath sounds present. No wheezing.  Abdominal:     General: Bowel sounds are normal. There is no distension.     Palpations: Abdomen is soft.     Tenderness: There is no abdominal tenderness.  Musculoskeletal:        General: Tenderness present.     Right lower leg: Edema present.     Left lower leg: Edema present.     Comments: Pt in wheelchair, pain in lumbar with flexion and extension  Skin:    General:  Skin is warm and dry.     Findings: No erythema or rash.  Neurological:     Mental Status: He is alert and oriented to person, place, and time.     Cranial Nerves: No cranial nerve deficit.     Deep Tendon Reflexes: Reflexes are normal and symmetric.  Psychiatric:        Behavior: Behavior normal.        Thought Content: Thought content normal.        Judgment: Judgment normal.       BP 131/90   Pulse 75   Temp 99.1 F (37.3 C) (Temporal)   SpO2 (!) 88% Comment: sitting with room oxygen     Assessment & Plan:  [name] comes in today with chief complaint of protime check, Diabetes, and oxygen low   Diagnosis and orders addressed:  1. Chronic anticoagulation Description   Hold today's dose and only take 5 mg on Wedneday, then resume Continue taking 1 1/2 tablets on Wednesdays and 1 tablet all other days of the week.  INR today was 3.4(too thin) and your goal is 2.0-3.0 (INR has been at goal for the last 7 months)     - CoaguChek XS/INR Waived - CBC with Differential/Platelet - CMP14+EGFR  2. Chronic migraine without aura, intractable, with status migrainosus - CBC with Differential/Platelet - CMP14+EGFR  3. Intractable chronic migraine without aura  and without status migrainosus - CBC with Differential/Platelet - CMP14+EGFR  4. OSA (obstructive sleep apnea) - CBC with Differential/Platelet - CMP14+EGFR  5. Obesity hypoventilation syndrome (HCC) - CBC with Differential/Platelet - CMP14+EGFR  6. Hyperlipidemia associated with type 2 diabetes mellitus ([place]) - CBC with Differential/Platelet - CMP14+EGFR  7. Type 2 diabetes mellitus with hyperglycemia, with long-term current use of insulin (HCC) - Bayer DCA Hb A1c Waived - CBC with Differential/Platelet - CMP14+EGFR  8. Seizure disorder (HCC) - CBC with Differential/Platelet - CMP14+EGFR  9. Benign prostatic hyperplasia with lower urinary tract symptoms, symptom details unspecified - CBC with  Differential/Platelet - CMP14+EGFR  10. Vitamin D deficiency - CBC with Differential/Platelet - CMP14+EGFR  11. Morbid obesity ([place]) - CBC with Differential/Platelet - CMP14+EGFR  12. History of pulmonary embolism - CBC with Differential/Platelet - CMP14+EGFR  13. GAD (generalized anxiety disorder) - CBC with Differential/Platelet - CMP14+EGFR  14. Moderate episode of recurrent major depressive disorder (HCC) - CBC with Differential/Platelet - CMP14+EGFR  15. Anemia, unspecified type - CBC with Differential/Platelet - CMP14+EGFR  16. Postoperative pulmonary edema (HCC) - CBC with Differential/Platelet - CMP14+EGFR  Rx for oxygen written- today's O2 sat of 88% is on room air while sitting  Labs pending Health Maintenance reviewed Diet and exercise encouraged  Follow up plan: 2 weeks to recheck INR, keep all follow up appts with Specialists   Evelina Dun, FNP

## 2019-01-25 ENCOUNTER — Ambulatory Visit (HOSPITAL_COMMUNITY): Payer: Medicare Other

## 2019-01-25 ENCOUNTER — Emergency Department (HOSPITAL_COMMUNITY)
Admission: EM | Admit: 2019-01-25 | Discharge: 2019-01-25 | Discharge status: Against Medical Advice | Payer: Medicare Other | Attending: Emergency Medicine | Admitting: Emergency Medicine

## 2019-01-25 ENCOUNTER — Encounter (HOSPITAL_COMMUNITY): Payer: Self-pay

## 2019-01-25 ENCOUNTER — Other Ambulatory Visit: Payer: Self-pay

## 2019-01-25 DIAGNOSIS — Z5321 Procedure and treatment not carried out due to patient leaving prior to being seen by health care provider: Secondary | ICD-10-CM | POA: Insufficient documentation

## 2019-01-25 DIAGNOSIS — R7982 Elevated C-reactive protein (CRP): Secondary | ICD-10-CM | POA: Diagnosis present

## 2019-01-25 LAB — CMP14+EGFR
ALT: 13 IU/L (ref 0–44)
AST: 30 IU/L (ref 0–40)
Albumin/Globulin Ratio: 0.8 — ABNORMAL LOW (ref 1.2–2.2)
Albumin: 3 g/dL — ABNORMAL LOW (ref 3.8–4.9)
Alkaline Phosphatase: 80 IU/L (ref 39–117)
BUN/Creatinine Ratio: 12 (ref 9–20)
BUN: 35 mg/dL — ABNORMAL HIGH (ref 6–24)
Bilirubin Total: 0.5 mg/dL (ref 0.0–1.2)
CO2: 26 mmol/L (ref 20–29)
Calcium: 8.3 mg/dL — ABNORMAL LOW (ref 8.7–10.2)
Chloride: 96 mmol/L (ref 96–106)
Creatinine, Ser: 3.03 mg/dL (ref 0.76–1.27)
GFR calc Af Amer: 26 mL/min/{1.73_m2} — ABNORMAL LOW (ref >59)
GFR calc non Af Amer: 23 mL/min/{1.73_m2} — ABNORMAL LOW (ref >59)
Globulin, Total: 3.6 g/dL (ref 1.5–4.5)
Glucose: 188 mg/dL — ABNORMAL HIGH (ref 65–99)
Potassium: 4.6 mmol/L (ref 3.5–5.2)
Sodium: 141 mmol/L (ref 134–144)
Total Protein: 6.6 g/dL (ref 6.0–8.5)

## 2019-01-25 LAB — CBC WITH DIFFERENTIAL/PLATELET
Basophils Absolute: 0.1 10*3/uL (ref 0.0–0.2)
Basos: 1 %
EOS (ABSOLUTE): 0.2 10*3/uL (ref 0.0–0.4)
Eos: 4 %
Hematocrit: 39.6 % (ref 37.5–51.0)
Hemoglobin: 13.1 g/dL (ref 13.0–17.7)
Immature Grans (Abs): 0 10*3/uL (ref 0.0–0.1)
Immature Granulocytes: 0 %
Lymphocytes Absolute: 1.2 10*3/uL (ref 0.7–3.1)
Lymphs: 22 %
MCH: 29.5 pg (ref 26.6–33.0)
MCHC: 33.1 g/dL (ref 31.5–35.7)
MCV: 89 fL (ref 79–97)
Monocytes Absolute: 0.4 10*3/uL (ref 0.1–0.9)
Monocytes: 7 %
Neutrophils Absolute: 3.6 10*3/uL (ref 1.4–7.0)
Neutrophils: 66 %
Platelets: 103 10*3/uL — ABNORMAL LOW (ref 150–450)
RBC: 4.44 x10E6/uL (ref 4.14–5.80)
RDW: 14.9 % (ref 11.6–15.4)
WBC: 5.5 10*3/uL (ref 3.4–10.8)

## 2019-01-25 LAB — CBC
HCT: 39.8 % (ref 39.0–52.0)
Hemoglobin: 12.6 g/dL — ABNORMAL LOW (ref 13.0–17.0)
MCH: 29.5 pg (ref 26.0–34.0)
MCHC: 31.7 g/dL (ref 30.0–36.0)
MCV: 93.2 fL (ref 80.0–100.0)
Platelets: 97 10*3/uL — ABNORMAL LOW (ref 150–400)
RBC: 4.27 MIL/uL (ref 4.22–5.81)
RDW: 14.3 % (ref 11.5–15.5)
WBC: 5.5 10*3/uL (ref 4.0–10.5)
nRBC: 0 % (ref 0.0–0.2)

## 2019-01-25 LAB — BASIC METABOLIC PANEL
Anion gap: 10 (ref 5–15)
BUN: 44 mg/dL — ABNORMAL HIGH (ref 6–20)
CO2: 28 mmol/L (ref 22–32)
Calcium: 8.4 mg/dL — ABNORMAL LOW (ref 8.9–10.3)
Chloride: 98 mmol/L (ref 98–111)
Creatinine, Ser: 3.68 mg/dL — ABNORMAL HIGH (ref 0.61–1.24)
GFR calc Af Amer: 21 mL/min — ABNORMAL LOW (ref >60)
GFR calc non Af Amer: 18 mL/min — ABNORMAL LOW (ref >60)
Glucose, Bld: 198 mg/dL — ABNORMAL HIGH (ref 70–99)
Potassium: 3.8 mmol/L (ref 3.5–5.1)
Sodium: 136 mmol/L (ref 135–145)

## 2019-01-25 NOTE — ED Triage Notes (Signed)
Pt sent here by PCP for elevated kidney function labs. Pt has no complaints. On 2L [place] all the time.

## 2019-01-25 NOTE — ED Notes (Signed)
Pt came to nurses desk stating he would like to leave and that he will call his PCP in the am. This rn encouraged pt to stay but pt refused.

## 2019-01-26 ENCOUNTER — Ambulatory Visit (HOSPITAL_COMMUNITY): Payer: Medicare Other | Admitting: Hematology

## 2019-01-26 ENCOUNTER — Emergency Department (HOSPITAL_COMMUNITY): Payer: Medicare Other

## 2019-01-26 ENCOUNTER — Encounter (HOSPITAL_COMMUNITY): Payer: Self-pay | Admitting: *Deleted

## 2019-01-26 ENCOUNTER — Emergency Department (HOSPITAL_COMMUNITY)
Admission: EM | Admit: 2019-01-26 | Discharge: 2019-01-26 | Disposition: A | Discharge status: Home / Self Care | Payer: Medicare Other | Attending: Emergency Medicine | Admitting: Emergency Medicine

## 2019-01-26 ENCOUNTER — Other Ambulatory Visit: Payer: Self-pay

## 2019-01-26 DIAGNOSIS — N189 Chronic kidney disease, unspecified: Secondary | ICD-10-CM | POA: Diagnosis not present

## 2019-01-26 DIAGNOSIS — Z7901 Long term (current) use of anticoagulants: Secondary | ICD-10-CM | POA: Diagnosis not present

## 2019-01-26 DIAGNOSIS — E669 Obesity, unspecified: Secondary | ICD-10-CM | POA: Diagnosis not present

## 2019-01-26 DIAGNOSIS — R600 Localized edema: Secondary | ICD-10-CM | POA: Insufficient documentation

## 2019-01-26 DIAGNOSIS — N179 Acute kidney failure, unspecified: Secondary | ICD-10-CM | POA: Diagnosis not present

## 2019-01-26 DIAGNOSIS — R0602 Shortness of breath: Secondary | ICD-10-CM | POA: Diagnosis not present

## 2019-01-26 DIAGNOSIS — E1122 Type 2 diabetes mellitus with diabetic chronic kidney disease: Secondary | ICD-10-CM | POA: Insufficient documentation

## 2019-01-26 DIAGNOSIS — Z86718 Personal history of other venous thrombosis and embolism: Secondary | ICD-10-CM | POA: Diagnosis not present

## 2019-01-26 DIAGNOSIS — Z86711 Personal history of pulmonary embolism: Secondary | ICD-10-CM | POA: Diagnosis not present

## 2019-01-26 DIAGNOSIS — Z91040 Latex allergy status: Secondary | ICD-10-CM | POA: Insufficient documentation

## 2019-01-26 DIAGNOSIS — Z794 Long term (current) use of insulin: Secondary | ICD-10-CM | POA: Diagnosis not present

## 2019-01-26 DIAGNOSIS — F319 Bipolar disorder, unspecified: Secondary | ICD-10-CM | POA: Diagnosis not present

## 2019-01-26 DIAGNOSIS — Z79899 Other long term (current) drug therapy: Secondary | ICD-10-CM | POA: Insufficient documentation

## 2019-01-26 DIAGNOSIS — N289 Disorder of kidney and ureter, unspecified: Secondary | ICD-10-CM | POA: Diagnosis present

## 2019-01-26 LAB — URINALYSIS, ROUTINE W REFLEX MICROSCOPIC
Bilirubin Urine: NEGATIVE
Glucose, UA: >=500 mg/dL — AB
Ketones, ur: NEGATIVE mg/dL
Leukocytes,Ua: NEGATIVE
Nitrite: NEGATIVE
Protein, ur: 100 mg/dL — AB
RBC / HPF: >50 RBC/hpf — ABNORMAL HIGH (ref 0–5)
Specific Gravity, Urine: 1.01 (ref 1.005–1.030)
pH: 6 (ref 5.0–8.0)

## 2019-01-26 LAB — BASIC METABOLIC PANEL
Anion gap: 11 (ref 5–15)
BUN: 45 mg/dL — ABNORMAL HIGH (ref 6–20)
CO2: 26 mmol/L (ref 22–32)
Calcium: 8.2 mg/dL — ABNORMAL LOW (ref 8.9–10.3)
Chloride: 99 mmol/L (ref 98–111)
Creatinine, Ser: 3.57 mg/dL — ABNORMAL HIGH (ref 0.61–1.24)
GFR calc Af Amer: 21 mL/min — ABNORMAL LOW (ref >60)
GFR calc non Af Amer: 18 mL/min — ABNORMAL LOW (ref >60)
Glucose, Bld: 265 mg/dL — ABNORMAL HIGH (ref 70–99)
Potassium: 4.2 mmol/L (ref 3.5–5.1)
Sodium: 136 mmol/L (ref 135–145)

## 2019-01-26 LAB — CBC
HCT: 39.4 % (ref 39.0–52.0)
Hemoglobin: 12.6 g/dL — ABNORMAL LOW (ref 13.0–17.0)
MCH: 29.8 pg (ref 26.0–34.0)
MCHC: 32 g/dL (ref 30.0–36.0)
MCV: 93.1 fL (ref 80.0–100.0)
Platelets: 97 10*3/uL — ABNORMAL LOW (ref 150–400)
RBC: 4.23 MIL/uL (ref 4.22–5.81)
RDW: 14.4 % (ref 11.5–15.5)
WBC: 5.3 10*3/uL (ref 4.0–10.5)
nRBC: 0 % (ref 0.0–0.2)

## 2019-01-26 LAB — TROPONIN I (HIGH SENSITIVITY)
Troponin I (High Sensitivity): 19 ng/L — ABNORMAL HIGH (ref <18)
Troponin I (High Sensitivity): 19 ng/L — ABNORMAL HIGH (ref <18)

## 2019-01-26 MED ORDER — SODIUM CHLORIDE 0.9 % IV BOLUS
1000.0000 mL | Freq: Once | INTRAVENOUS | Status: AC
  Administered 2019-01-26: 1000 mL via INTRAVENOUS

## 2019-01-26 MED ORDER — SODIUM CHLORIDE 0.9% FLUSH
3.0000 mL | Freq: Once | INTRAVENOUS | Status: AC
  Administered 2019-01-26: 18:00:00 3 mL via INTRAVENOUS

## 2019-01-26 MED ORDER — HYDROCODONE-ACETAMINOPHEN 5-325 MG PO TABS
2.0000 | ORAL_TABLET | Freq: Once | ORAL | Status: AC
  Administered 2019-01-26: 2 via ORAL
  Filled 2019-01-26: qty 2

## 2019-01-26 NOTE — Discharge Instructions (Signed)
Increase your Lasix to 160 mg in the morning and 80 mg in the afternoon.  Dr. Shelva Majestic office will call you to arrange a follow-up appointment within the next week.  Return to the emergency department if your symptoms significantly worsen or change.

## 2019-01-26 NOTE — ED Notes (Signed)
Pt requesting to talk to the Charge nurse again.  Expressing concerns about siting in waiting room when his labs are abnormal.

## 2019-01-26 NOTE — ED Triage Notes (Signed)
Pt reports mid CP

## 2019-01-26 NOTE — ED Triage Notes (Signed)
Pt in reports creatinine elevated from labs that were drawn yesterday, pt c/o fatigue & nausea, pt reports tremors, pt c/o SOB worsening with using O2 2L at baseline, A&O x4

## 2019-01-26 NOTE — ED Notes (Signed)
Pt called out asking for pain medication.
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
## 2019-01-31 ENCOUNTER — Ambulatory Visit (HOSPITAL_COMMUNITY): Payer: Medicare Other

## 2019-02-02 ENCOUNTER — Other Ambulatory Visit: Payer: Self-pay

## 2019-02-02 ENCOUNTER — Inpatient Hospital Stay (HOSPITAL_COMMUNITY): Payer: Medicare Other | Attending: Hematology | Admitting: Hematology

## 2019-02-02 ENCOUNTER — Encounter (HOSPITAL_COMMUNITY): Payer: Self-pay | Admitting: Hematology

## 2019-02-02 ENCOUNTER — Telehealth: Payer: Self-pay | Admitting: Family

## 2019-02-02 DIAGNOSIS — N189 Chronic kidney disease, unspecified: Secondary | ICD-10-CM | POA: Diagnosis not present

## 2019-02-02 DIAGNOSIS — D696 Thrombocytopenia, unspecified: Secondary | ICD-10-CM | POA: Insufficient documentation

## 2019-02-02 DIAGNOSIS — R161 Splenomegaly, not elsewhere classified: Secondary | ICD-10-CM | POA: Insufficient documentation

## 2019-02-02 DIAGNOSIS — E119 Type 2 diabetes mellitus without complications: Secondary | ICD-10-CM | POA: Diagnosis not present

## 2019-02-02 DIAGNOSIS — Z79899 Other long term (current) drug therapy: Secondary | ICD-10-CM | POA: Diagnosis not present

## 2019-02-02 DIAGNOSIS — Z794 Long term (current) use of insulin: Secondary | ICD-10-CM | POA: Diagnosis not present

## 2019-02-02 NOTE — Telephone Encounter (Signed)
Please advise. I could not locate an order.

## 2019-02-02 NOTE — Patient Instructions (Addendum)
[facility] at [facility] Discharge Instructions  You were seen today by Dr. Katragadda. He went over your recent lab results. He will see you back after your scan for follow up.   Thank you for choosing [facility] at [facility] to provide your oncology and hematology care.  To afford each patient quality time with our provider, please arrive at least 15 minutes before your scheduled appointment time.   If you have a lab appointment with the Cancer Center please come in thru the  Main Entrance and check in at the main information desk  You need to re-schedule your appointment should you arrive 10 or more minutes late.  We strive to give you quality time with our providers, and arriving late affects you and other patients whose appointments are after yours.  Also, if you no show three or more times for appointments you may be dismissed from the clinic at the providers discretion.     Again, thank you for choosing [facility].  Our hope is that these requests will decrease the amount of time that you wait before being seen by our physicians.       _____________________________________________________________  Should you have questions after your visit to  Cancer Center, please contact our office at [phone] between the hours of 8:00 a.m. and 4:30 p.m.  Voicemails left after 4:00 p.m. will not be returned until the following business day.  For prescription refill requests, have your pharmacy contact our office and allow 72 hours.    Cancer Center Support Programs:   > Cancer Support Group  2nd Tuesday of the month 1pm-2pm, Journey Room    
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
## 2019-02-02 NOTE — Assessment & Plan Note (Signed)
1.  Fevers and night sweats: - He is continuing to have fevers of 101-102 degrees intermittently almost on a daily basis. -CT CAP without IV contrast on 12/28/2018 showed splenomegaly measuring 19.6 cm stable since 2018.  Stable upper normal to mildly enlarged lymph nodes in the upper abdomen compared to 05/18/2017.  Hepatic steatosis present. - Because of his fevers, I have recommended PET CT scan to evaluate for lymphomatous process.  PET scan would be the only way to look for any lymphomatous process in the spleen. -We will see him back after the PET CT scan.  2.  Thrombocytopenia: - His platelet count is between 90-1 10. -Nutritional work-up including folic acid, 123456, copper, ferritin were normal.  Hepatitis panel was normal.  ANA and H. pylori testing was negative.  Rheumatoid factor was slightly elevated. -It is possible that splenomegaly is contributing to his thrombocytopenia.  We will continue to monitor closely.

## 2019-02-03 NOTE — Telephone Encounter (Signed)
Updated orders being faxed to [facility]

## 2019-02-04 ENCOUNTER — Other Ambulatory Visit: Payer: Self-pay | Admitting: Family

## 2019-02-04 DIAGNOSIS — J189 Pneumonia, unspecified organism: Secondary | ICD-10-CM

## 2019-02-06 ENCOUNTER — Other Ambulatory Visit: Payer: Self-pay

## 2019-02-06 DIAGNOSIS — E1122 Type 2 diabetes mellitus with diabetic chronic kidney disease: Secondary | ICD-10-CM | POA: Diagnosis not present

## 2019-02-06 DIAGNOSIS — I129 Hypertensive chronic kidney disease with stage 1 through stage 4 chronic kidney disease, or unspecified chronic kidney disease: Secondary | ICD-10-CM | POA: Diagnosis not present

## 2019-02-06 DIAGNOSIS — N183 Chronic kidney disease, stage 3 (moderate): Secondary | ICD-10-CM | POA: Diagnosis not present

## 2019-02-06 DIAGNOSIS — G8929 Other chronic pain: Secondary | ICD-10-CM | POA: Diagnosis not present

## 2019-02-07 ENCOUNTER — Other Ambulatory Visit: Payer: Self-pay

## 2019-02-07 ENCOUNTER — Ambulatory Visit (INDEPENDENT_AMBULATORY_CARE_PROVIDER_SITE_OTHER): Payer: Medicare Other | Admitting: Family

## 2019-02-07 ENCOUNTER — Encounter: Payer: Self-pay | Admitting: Family

## 2019-02-07 VITALS — BP 130/87 | HR 73 | Temp 97.5°F

## 2019-02-07 DIAGNOSIS — R0602 Shortness of breath: Secondary | ICD-10-CM

## 2019-02-07 DIAGNOSIS — Z7901 Long term (current) use of anticoagulants: Secondary | ICD-10-CM | POA: Diagnosis not present

## 2019-02-07 DIAGNOSIS — Z86711 Personal history of pulmonary embolism: Secondary | ICD-10-CM

## 2019-02-07 LAB — COAGUCHEK XS/INR WAIVED
INR: 2.9 — ABNORMAL HIGH (ref 0.9–1.1)
Prothrombin Time: 34.9 s

## 2019-02-07 NOTE — Progress Notes (Signed)
  Subjective:    Patient ID: [name], male    DOB: 1966/01/15, 53 y.o.   MRN: [id number]  Chief Complaint  Patient presents with  . protime recheck    HPI Pt presents to the office today to recheck INR. See anticoagulation flow sheet. Pt denies any changes in his medications. States he is wearing O2 and has had a nose bleed.   He is in the process of getting O2 portable, as he is on oxygen continuously at home. His O2 sat sitting is 92 % on room air  and 88% when standing on room air. He can not walk because of his health conditions, but gets SOB with any exertion.    Review of Systems  Respiratory: Positive for shortness of breath.   All other systems reviewed and are negative.      Objective:   Physical Exam Vitals signs reviewed.  Constitutional:      General: He is not in acute distress.    Appearance: He is well-developed. He is obese.  HENT:     Head: Normocephalic.  Eyes:     General:        Right eye: No discharge.        Left eye: No discharge.     Pupils: Pupils are equal, round, and reactive to light.  Neck:     Musculoskeletal: Normal range of motion and neck supple.     Thyroid: No thyromegaly.  Cardiovascular:     Rate and Rhythm: Normal rate and regular rhythm.     Heart sounds: Normal heart sounds. No murmur.  Pulmonary:     Effort: Pulmonary effort is normal. No respiratory distress.     Breath sounds: Decreased breath sounds present. No wheezing.  Abdominal:     General: Bowel sounds are normal. There is no distension.     Palpations: Abdomen is soft.     Tenderness: There is no abdominal tenderness.  Musculoskeletal:        General: No tenderness.     Comments: Pt in wheelchair, SOB with exertion  Skin:    General: Skin is warm and dry.     Findings: No erythema or rash.  Neurological:     Mental Status: He is alert and oriented to person, place, and time.     Deep Tendon Reflexes: Reflexes are normal and symmetric.  Psychiatric:        Behavior: Behavior normal.        Thought Content: Thought content normal.        Judgment: Judgment normal.        BP 130/87   Pulse 73   Temp (!) 97.5 F (36.4 C) (Oral)   Assessment & Plan:  [name] comes in today with chief complaint of protime recheck   Diagnosis and orders addressed:  1. Chronic anticoagulation Description   Given recent nose bleed we will decrease to  5 mg (1 tablet) every day.   INR today was 2.9 (at goal) and your goal is 2.0-3.0     - CoaguChek XS/INR Waived   3. History of pulmonary embolism  4. Morbid obesity ([place])  5. SOB (shortness of breath) on exertion Will resubmit to get portable oxygen for patient    Labs reviewed Keep follow up with Oncologists and  nephrologists Health Maintenance reviewed Diet and exercise encouraged  Follow up plan: 1 month

## 2019-02-07 NOTE — Patient Instructions (Signed)
Description   Given recent nose bleed we will decrease to  5 mg (1 tablet) every day.   INR today was 2.9 (at goal) and your goal is 2.0-3.0

## 2019-02-08 ENCOUNTER — Ambulatory Visit (INDEPENDENT_AMBULATORY_CARE_PROVIDER_SITE_OTHER): Payer: Medicare Other | Admitting: *Deleted

## 2019-02-08 VITALS — Ht 73.0 in | Wt >= 6400 oz

## 2019-02-08 DIAGNOSIS — Z0000 Encounter for general adult medical examination without abnormal findings: Secondary | ICD-10-CM

## 2019-02-08 NOTE — Patient Instructions (Signed)
[name] , Thank you for taking time to come for your Medicare Wellness Visit. I appreciate your ongoing commitment to your health goals. Please review the following plan we discussed and let me know if I can assist you in the future.   These are the goals we discussed: Goals   None     This is a list of the screening recommended for you and due dates:  Health Maintenance  Topic Date Due   Eye exam for diabetics  08/04/2018   Flu Shot  11/17/2019*   Hemoglobin A1C  07/27/2019   Urine Protein Check  10/24/2019   Complete foot exam   01/24/2020   Tetanus Vaccine  09/29/2023   Colon Cancer Screening  08/05/2027   Pneumococcal vaccine  Completed   HIV Screening  Completed  *Topic was postponed. The date shown is not the original due date.    Advance Directive  Advance directives are legal documents that let you make choices ahead of time about your health care and medical treatment in case you become unable to communicate for yourself. Advance directives are a way for you to communicate your wishes to family, friends, and health care providers. This can help convey your decisions about end-of-life care if you become unable to communicate. Discussing and writing advance directives should happen over time rather than all at once. Advance directives can be changed depending on your situation and what you want, even after you have signed the advance directives. If you do not have an advance directive, some states assign family decision makers to act on your behalf based on how closely you are related to them. Each state has its own laws regarding advance directives. You may want to check with your health care provider, attorney, or state representative about the laws in your state. There are different types of advance directives, such as:  Medical power of attorney.  Living will.  Do not resuscitate (DNR) or do not attempt resuscitation (DNAR) order. Health care proxy and  medical power of attorney A health care proxy, also called a health care agent, is a person who is appointed to make medical decisions for you in cases in which you are unable to make the decisions yourself. Generally, people choose someone they know well and trust to represent their preferences. Make sure to ask this person for an agreement to act as your proxy. A proxy may have to exercise judgment in the event of a medical decision for which your wishes are not known. A medical power of attorney is a legal document that names your health care proxy. Depending on the laws in your state, after the document is written, it may also need to be:  Signed.  Notarized.  Dated.  Copied.  Witnessed.  Incorporated into your medical record. You may also want to appoint someone to manage your financial affairs in a situation in which you are unable to do so. This is called a durable power of attorney for finances. It is a separate legal document from the durable power of attorney for health care. You may choose the same person or someone different from your health care proxy to act as your agent in financial matters. If you do not appoint a proxy, or if there is a concern that the proxy is not acting in your best interests, a court-appointed guardian may be designated to act on your behalf. Living will A living will is a set of instructions documenting your wishes about medical  care when you cannot express them yourself. Health care providers should keep a copy of your living will in your medical record. You may want to give a copy to family members or friends. To alert caregivers in case of an emergency, you can place a card in your wallet to let them know that you have a living will and where they can find it. A living will is used if you become:  Terminally ill.  Incapacitated.  Unable to communicate or make decisions. Items to consider in your living will include:  The use or non-use of  life-sustaining equipment, such as dialysis machines and breathing machines (ventilators).  A DNR or DNAR order, which is the instruction not to use cardiopulmonary resuscitation (CPR) if breathing or heartbeat stops.  The use or non-use of tube feeding.  Withholding of food and fluids.  Comfort (palliative) care when the goal becomes comfort rather than a cure.  Organ and tissue donation. A living will does not give instructions for distributing your money and property if you should pass away. It is recommended that you seek the advice of a lawyer when writing a will. Decisions about taxes, beneficiaries, and asset distribution will be legally binding. This process can relieve your family and friends of any concerns surrounding disputes or questions that may come up about the distribution of your assets. DNR or DNAR A DNR or DNAR order is a request not to have CPR in the event that your heart stops beating or you stop breathing. If a DNR or DNAR order has not been made and shared, a health care provider will try to help any patient whose heart has stopped or who has stopped breathing. If you plan to have surgery, talk with your health care provider about how your DNR or DNAR order will be followed if problems occur. Summary  Advance directives are the legal documents that allow you to make choices ahead of time about your health care and medical treatment in case you become unable to communicate for yourself.  The process of discussing and writing advance directives should happen over time. You can change the advance directives, even after you have signed them.  Advance directives include DNR or DNAR orders, living wills, and designating an agent as your medical power of attorney. This information is not intended to replace advice given to you by your health care provider. Make sure you discuss any questions you have with your health care provider. Document Released: 09/22/2007 Document  Revised: 07/20/2018 Document Reviewed: 05/04/2016 Elsevier Patient Education  [name].    BMI for Adults  Body mass index (BMI) is a number that is calculated from a person's weight and height. BMI may help to estimate how much of a person's weight is composed of fat. BMI can help identify those who may be at higher risk for certain medical problems. How is BMI used with adults? BMI is used as a screening tool to identify possible weight problems. It is used to check whether a person is obese, overweight, healthy weight, or underweight. How is BMI calculated? BMI measures your weight and compares it to your height. This can be done either in [place] ([place] or metric measurements. Note that charts are available to help you find your BMI quickly and easily without having to do these calculations yourself. To calculate your BMI in [place] ([place] measurements, your health care provider will: 1. Measure your weight in pounds (lb). 2. Multiply the number of pounds by 703. ?  For example, for a person who weighs 180 lb, multiply that number by 703, which equals 126,540. 3. Measure your height in inches (in). Then multiply that number by itself to get a measurement called "inches squared." ? For example, for a person who is 70 in tall, the "inches squared" measurement is 70 in x 70 in, which equals 4900 inches squared. 4. Divide the total from Step 2 (number of lb x 703) by the total from Step 3 (inches squared): 126,540  4900 = 25.8. This is your BMI. To calculate your BMI in metric measurements, your health care provider will: 1. Measure your weight in kilograms (kg). 2. Measure your height in meters (m). Then multiply that number by itself to get a measurement called "meters squared." ? For example, for a person who is 1.75 m tall, the "meters squared" measurement is 1.75 m x 1.75 m, which is equal to 3.1 meters squared. 3. Divide the number of kilograms (your weight) by the meters  squared number. In this example: 70  3.1 = 22.6. This is your BMI. How is BMI interpreted? To interpret your results, your health care provider will use BMI charts to identify whether you are underweight, normal weight, overweight, or obese. The following guidelines will be used:  Underweight: BMI less than 18.5.  Normal weight: BMI between 18.5 and 24.9.  Overweight: BMI between 25 and 29.9.  Obese: BMI of 30 and above. Please note:  Weight includes both fat and muscle, so someone with a muscular build, such as an athlete, may have a BMI that is higher than 24.9. In cases like these, BMI is not an accurate measure of body fat.  To determine if excess body fat is the cause of a BMI of 25 or higher, further assessments may need to be done by a health care provider.  BMI is usually interpreted in the same way for men and women. Why is BMI a useful tool? BMI is useful in two ways:  Identifying a weight problem that may be related to a medical condition, or that may increase the risk for medical problems.  Promoting lifestyle and diet changes in order to reach a healthy weight. Summary  Body mass index (BMI) is a number that is calculated from a person's weight and height.  BMI may help to estimate how much of a person's weight is composed of fat. BMI can help identify those who may be at higher risk for certain medical problems.  BMI can be measured using English measurements or metric measurements.  To interpret your results, your health care provider will use BMI charts to identify whether you are underweight, normal weight, overweight, or obese. This information is not intended to replace advice given to you by your health care provider. Make sure you discuss any questions you have with your health care provider. Document Released: 02/25/2004 Document Revised: 05/28/2017 Document Reviewed: 04/28/2017 Elsevier Patient Education  2020 [employer].
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
## 2019-02-10 ENCOUNTER — Other Ambulatory Visit: Payer: Self-pay | Admitting: Nephrology

## 2019-02-10 DIAGNOSIS — N183 Chronic kidney disease, stage 3 unspecified: Secondary | ICD-10-CM

## 2019-02-11 DIAGNOSIS — J449 Chronic obstructive pulmonary disease, unspecified: Secondary | ICD-10-CM | POA: Diagnosis not present

## 2019-02-11 DIAGNOSIS — G8194 Hemiplegia, unspecified affecting left nondominant side: Secondary | ICD-10-CM | POA: Diagnosis not present

## 2019-02-11 DIAGNOSIS — G4733 Obstructive sleep apnea (adult) (pediatric): Secondary | ICD-10-CM | POA: Diagnosis not present

## 2019-02-14 ENCOUNTER — Other Ambulatory Visit: Payer: Self-pay | Admitting: Family

## 2019-02-14 DIAGNOSIS — F411 Generalized anxiety disorder: Secondary | ICD-10-CM

## 2019-02-16 ENCOUNTER — Telehealth: Payer: Self-pay | Admitting: Family

## 2019-02-16 ENCOUNTER — Ambulatory Visit (HOSPITAL_COMMUNITY): Payer: Medicare Other

## 2019-02-16 DIAGNOSIS — Z0289 Encounter for other administrative examinations: Secondary | ICD-10-CM

## 2019-02-16 DIAGNOSIS — F1120 Opioid dependence, uncomplicated: Secondary | ICD-10-CM

## 2019-02-16 DIAGNOSIS — G894 Chronic pain syndrome: Secondary | ICD-10-CM

## 2019-02-16 MED ORDER — FENTANYL 75 MCG/HR TD PT72: 1.0000 | MEDICATED_PATCH | TRANSDERMAL | 0 refills | Status: DC

## 2019-02-16 MED ORDER — HYDROCODONE-ACETAMINOPHEN 7.5-325 MG PO TABS: 1.0000 | ORAL_TABLET | Freq: Four times a day (QID) | ORAL | 0 refills | Status: AC | PRN

## 2019-02-16 NOTE — Telephone Encounter (Signed)
Please advise concerning request for pain medications.

## 2019-02-16 NOTE — Telephone Encounter (Signed)
Prescription sent to pharmacy , keep [facility] appt

## 2019-02-16 NOTE — Telephone Encounter (Signed)
Wife aware

## 2019-02-20 ENCOUNTER — Ambulatory Visit (HOSPITAL_COMMUNITY): Payer: Medicare Other | Admitting: Hematology

## 2019-02-20 DIAGNOSIS — I279 Pulmonary heart disease, unspecified: Secondary | ICD-10-CM | POA: Diagnosis not present

## 2019-02-20 DIAGNOSIS — G8194 Hemiplegia, unspecified affecting left nondominant side: Secondary | ICD-10-CM | POA: Diagnosis not present

## 2019-02-20 DIAGNOSIS — J449 Chronic obstructive pulmonary disease, unspecified: Secondary | ICD-10-CM | POA: Diagnosis not present

## 2019-02-21 ENCOUNTER — Ambulatory Visit
Admission: RE | Admit: 2019-02-21 | Discharge: 2019-02-21 | Disposition: A | Discharge status: Home / Self Care | Payer: Medicare Other | Source: Ambulatory Visit | Attending: Nephrology | Admitting: Nephrology

## 2019-02-21 DIAGNOSIS — N183 Chronic kidney disease, stage 3 unspecified: Secondary | ICD-10-CM
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
## 2019-02-22 ENCOUNTER — Encounter: Payer: Self-pay | Admitting: Neurology

## 2019-02-22 ENCOUNTER — Ambulatory Visit (INDEPENDENT_AMBULATORY_CARE_PROVIDER_SITE_OTHER): Payer: Medicare Other | Admitting: Neurology

## 2019-02-22 ENCOUNTER — Other Ambulatory Visit: Payer: Self-pay

## 2019-02-22 VITALS — BP 128/71 | HR 79 | Temp 98.7°F

## 2019-02-22 DIAGNOSIS — R251 Tremor, unspecified: Secondary | ICD-10-CM

## 2019-02-22 DIAGNOSIS — G43711 Chronic migraine without aura, intractable, with status migrainosus: Secondary | ICD-10-CM | POA: Diagnosis not present

## 2019-02-22 NOTE — Patient Instructions (Addendum)
  Tremor A tremor is trembling or shaking that you cannot control. Most tremors affect the hands or arms. Tremors can also affect the head, vocal cords, face, and other parts of the body. There are many types of tremors. Common types include:  Essential tremor. These usually occur in people older than 40. It may run in families and can happen in otherwise healthy people.  Resting tremor. These occur when the muscles are at rest, such as when your hands are resting in your lap. People with Parkinson's disease often have resting tremors.  Postural tremor. These occur when you try to hold a pose, such as keeping your hands outstretched.  Kinetic tremor. These occur during purposeful movement, such as trying to touch a finger to your nose.  Task-specific tremor. These may occur when you perform certain tasks such as writing, speaking, or standing.  Psychogenic tremor. These dramatically lessen or disappear when you are distracted. They can happen in people of all ages. Some types of tremors have no known cause. Tremors can also be a symptom of nervous system problems (neurological disorders) that may occur with aging. Some tremors go away with treatment, while others do not. Follow these instructions at home: Lifestyle      Limit alcohol intake to no more than 1 drink a day for nonpregnant women and 2 drinks a day for men. One drink equals 12 oz of beer, 5 oz of wine, or 1 oz of hard liquor.  Do not use any products that contain nicotine or tobacco, such as cigarettes and e-cigarettes. If you need help quitting, ask your health care provider.  Avoid extreme heat and extreme cold.  Limit your caffeine intake, as told by your health care provider.  Try to get 8 hours of sleep each night.  Find ways to manage your stress, such as meditation or yoga. General instructions  Take over-the-counter and prescription medicines only as told by your health care provider.  Keep all follow-up  visits as told by your health care provider. This is important. Contact a health care provider if you:  Develop a tremor after starting a new medicine.  Have a tremor along with other symptoms such as: ? Numbness. ? Tingling. ? Pain. ? Weakness.  Notice that your tremor gets worse.  Notice that your tremor interferes with your day-to-day life. Summary  A tremor is trembling or shaking that you cannot control.  Most tremors affect the hands or arms.  Some types of tremors have no known cause. Others may be a symptom of nervous system problems (neurological disorders).  Make sure you discuss any tremors you have with your health care provider. This information is not intended to replace advice given to you by your health care provider. Make sure you discuss any questions you have with your health care provider. Document Released: 06/05/2002 Document Revised: 05/28/2017 Document Reviewed: 04/15/2017 Elsevier Patient Education  2020 Elsevier Inc.    Essential Tremor A tremor is trembling or shaking that a person cannot control. Most tremors affect the hands or arms. Tremors can also affect the head, vocal cords, legs, and other parts of the body. Essential tremor is a tremor without a known cause. Usually, it occurs while a person is trying to perform an action. It tends to get worse gradually as a person ages. What are the causes? The cause of this condition is not known. What increases the risk? You are more likely to develop this condition if:  You have a   family member with essential tremor.  You are age 40 or older.  You take certain medicines. What are the signs or symptoms? The main sign of a tremor is a rhythmic shaking of certain parts of your body that is uncontrolled and unintentional. You may:  Have difficulty eating with a spoon or fork.  Have difficulty writing.  Nod your head up and down or side to side.  Have a quivering voice. The shaking may:  Get  worse over time.  Come and go.  Be more noticeable on one side of your body.  Get worse due to stress, fatigue, caffeine, and extreme heat or cold. How is this diagnosed? This condition may be diagnosed based on:  Your symptoms and medical history.  A physical exam. There is no single test to diagnose an essential tremor. However, your health care provider may order tests to rule out other causes of your condition. These may include:  Blood and urine tests.  Imaging studies of your brain, such as CT scan and MRI.  A test that measures involuntary muscle movement (electromyogram). How is this treated? Treatment for essential tremor depends on the severity of the condition.  Some tremors may go away without treatment.  Mild tremors may not need treatment if they do not affect your day-to-day life.  Severe tremors may need to be treated using one or more of the following options: ? Medicines. ? Lifestyle changes. ? Occupational or physical therapy. Follow these instructions at home: Lifestyle   Do not use any products that contain nicotine or tobacco, such as cigarettes and e-cigarettes. If you need help quitting, ask your health care provider.  Limit your caffeine intake as told by your health care provider.  Try to get 8 hours of sleep each night.  Find ways to manage your stress that fits your lifestyle and personality. Consider trying meditation or yoga.  Try to anticipate stressful situations and allow extra time to manage them.  If you are struggling emotionally with the effects of your tremor, consider working with a mental health provider. General instructions  Take over-the-counter and prescription medicines only as told by your health care provider.  Avoid extreme heat and extreme cold.  Keep all follow-up visits as told by your health care provider. This is important. Visits may include physical therapy visits. Contact a health care provider if:  You  experience any changes in the location or intensity of your tremors.  You start having a tremor after starting a new medicine.  You have tremor with other symptoms, such as: ? Numbness. ? Tingling. ? Pain. ? Weakness.  Your tremor gets worse.  Your tremor interferes with your daily life.  You feel down, blue, or sad for at least 2 weeks in a row.  Worrying about your tremor and what other people think about you interferes with your everyday life functions, including relationships, work, or school. Summary  Essential tremor is a tremor without a known cause. Usually, it occurs when you are trying to perform an action.  The cause of this condition is not known.  The main sign of a tremor is a rhythmic shaking of certain parts of your body that is uncontrolled and unintentional.  Treatment for essential tremor depends on the severity of the condition. This information is not intended to replace advice given to you by your health care provider. Make sure you discuss any questions you have with your health care provider. Document Released: 07/06/2014 Document Revised: 06/25/2017 Document   Elsevier Patient Education  [employer].

## 2019-02-23 LAB — TSH: TSH: 3.5 u[IU]/mL (ref 0.450–4.500)

## 2019-02-23 LAB — T4, FREE: Free T4: 0.92 ng/dL (ref 0.82–1.77)

## 2019-02-24 ENCOUNTER — Other Ambulatory Visit: Payer: Self-pay

## 2019-02-24 ENCOUNTER — Ambulatory Visit (HOSPITAL_COMMUNITY)
Admission: RE | Admit: 2019-02-24 | Discharge: 2019-02-24 | Disposition: A | Discharge status: Home / Self Care | Payer: Medicare Other | Source: Ambulatory Visit | Attending: Hematology | Admitting: Hematology

## 2019-02-24 DIAGNOSIS — R61 Generalized hyperhidrosis: Secondary | ICD-10-CM | POA: Insufficient documentation

## 2019-02-24 DIAGNOSIS — R509 Fever, unspecified: Secondary | ICD-10-CM | POA: Insufficient documentation

## 2019-02-24 DIAGNOSIS — R161 Splenomegaly, not elsewhere classified: Secondary | ICD-10-CM | POA: Insufficient documentation

## 2019-02-24 LAB — GLUCOSE, CAPILLARY: Glucose-Capillary: 177 mg/dL — ABNORMAL HIGH (ref 70–99)

## 2019-02-24 MED ORDER — FLUDEOXYGLUCOSE F - 18 (FDG) INJECTION
16.0000 | Freq: Once | INTRAVENOUS | Status: AC | PRN
  Administered 2019-02-24: 16 via INTRAVENOUS

## 2019-02-26 ENCOUNTER — Other Ambulatory Visit: Payer: Self-pay | Admitting: Endocrinology

## 2019-02-27 DIAGNOSIS — M129 Arthropathy, unspecified: Secondary | ICD-10-CM | POA: Diagnosis not present

## 2019-02-27 DIAGNOSIS — M1990 Unspecified osteoarthritis, unspecified site: Secondary | ICD-10-CM | POA: Diagnosis not present

## 2019-02-27 DIAGNOSIS — Z79899 Other long term (current) drug therapy: Secondary | ICD-10-CM | POA: Diagnosis not present

## 2019-02-27 DIAGNOSIS — E559 Vitamin D deficiency, unspecified: Secondary | ICD-10-CM | POA: Diagnosis not present

## 2019-02-27 DIAGNOSIS — G894 Chronic pain syndrome: Secondary | ICD-10-CM | POA: Diagnosis not present

## 2019-03-01 ENCOUNTER — Inpatient Hospital Stay (HOSPITAL_COMMUNITY): Payer: Medicare Other | Attending: Hematology | Admitting: Hematology

## 2019-03-01 ENCOUNTER — Other Ambulatory Visit: Payer: Self-pay

## 2019-03-01 VITALS — BP 142/83 | HR 78 | Temp 98.4°F | Resp 18

## 2019-03-01 DIAGNOSIS — Z79899 Other long term (current) drug therapy: Secondary | ICD-10-CM | POA: Insufficient documentation

## 2019-03-01 DIAGNOSIS — M7989 Other specified soft tissue disorders: Secondary | ICD-10-CM | POA: Diagnosis not present

## 2019-03-01 DIAGNOSIS — I10 Essential (primary) hypertension: Secondary | ICD-10-CM | POA: Insufficient documentation

## 2019-03-01 DIAGNOSIS — D696 Thrombocytopenia, unspecified: Secondary | ICD-10-CM | POA: Diagnosis not present

## 2019-03-01 DIAGNOSIS — E785 Hyperlipidemia, unspecified: Secondary | ICD-10-CM | POA: Insufficient documentation

## 2019-03-01 DIAGNOSIS — R51 Headache: Secondary | ICD-10-CM | POA: Diagnosis not present

## 2019-03-01 DIAGNOSIS — Z7984 Long term (current) use of oral hypoglycemic drugs: Secondary | ICD-10-CM | POA: Diagnosis not present

## 2019-03-01 DIAGNOSIS — Z794 Long term (current) use of insulin: Secondary | ICD-10-CM | POA: Diagnosis not present

## 2019-03-01 DIAGNOSIS — R200 Anesthesia of skin: Secondary | ICD-10-CM | POA: Insufficient documentation

## 2019-03-01 DIAGNOSIS — E119 Type 2 diabetes mellitus without complications: Secondary | ICD-10-CM | POA: Insufficient documentation

## 2019-03-01 DIAGNOSIS — Z7901 Long term (current) use of anticoagulants: Secondary | ICD-10-CM | POA: Diagnosis not present
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
## 2019-03-01 NOTE — Patient Instructions (Signed)
[facility] at [facility] Discharge Instructions  You were seen today by Dr. Katragadda. He went over your recent lab and scan results. He will see you back in 4 months for labs and follow up.   Thank you for choosing [facility] at [facility] to provide your oncology and hematology care.  To afford each patient quality time with our provider, please arrive at least 15 minutes before your scheduled appointment time.   If you have a lab appointment with the Cancer Center please come in thru the  Main Entrance and check in at the main information desk  You need to re-schedule your appointment should you arrive 10 or more minutes late.  We strive to give you quality time with our providers, and arriving late affects you and other patients whose appointments are after yours.  Also, if you no show three or more times for appointments you may be dismissed from the clinic at the providers discretion.     Again, thank you for choosing [facility].  Our hope is that these requests will decrease the amount of time that you wait before being seen by our physicians.       _____________________________________________________________  Should you have questions after your visit to  Cancer Center, please contact our office at [phone] between the hours of 8:00 a.m. and 4:30 p.m.  Voicemails left after 4:00 p.m. will not be returned until the following business day.  For prescription refill requests, have your pharmacy contact our office and allow 72 hours.    Cancer Center Support Programs:   > Cancer Support Group  2nd Tuesday of the month 1pm-2pm, Journey Room    

## 2019-03-04 ENCOUNTER — Encounter (HOSPITAL_COMMUNITY): Payer: Self-pay | Admitting: Hematology

## 2019-03-04 NOTE — Assessment & Plan Note (Signed)
1.  Retroperitoneal adenopathy and splenomegaly: - He was having some fevers up to 101 degrees intermittently. -CT CAP without IV contrast on 12/28/2018 showed splenomegaly measuring 19.6 cm, stable since 2018.  Hepatic steatosis present.  Stable upper normal to mildly enlarged lymph nodes in the upper abdomen compared to 05/18/2017. - We reviewed PET scan from 02/24/2019.  Spleen has no hypermetabolism.  Retroperitoneal and peripancreatic lymph nodes have similar activity as blood pool.  This could be reactive. -He did not report any fevers at this visit.  We will closely monitor him.  He will come back in 4 months for follow-up.  2.  Thrombocytopenia: - Platelet count is anywhere between 90-110. -Etiology is splenomegaly from fatty liver. -Nutritional work-up including folic acid, 123456, copper, ferritin were normal.  Hepatitis panel was normal.  ANA and H. pylori testing was negative.  Rheumatoid factor is slightly elevated.

## 2019-03-05 ENCOUNTER — Other Ambulatory Visit: Payer: Self-pay | Admitting: Family

## 2019-03-05 DIAGNOSIS — J189 Pneumonia, unspecified organism: Secondary | ICD-10-CM

## 2019-03-07 ENCOUNTER — Other Ambulatory Visit: Payer: Self-pay | Admitting: Family

## 2019-03-07 DIAGNOSIS — F32A Depression, unspecified: Secondary | ICD-10-CM

## 2019-03-07 DIAGNOSIS — F329 Major depressive disorder, single episode, unspecified: Secondary | ICD-10-CM

## 2019-03-08 DIAGNOSIS — M19012 Primary osteoarthritis, left shoulder: Secondary | ICD-10-CM | POA: Diagnosis not present

## 2019-03-08 DIAGNOSIS — M25531 Pain in right wrist: Secondary | ICD-10-CM | POA: Diagnosis not present

## 2019-03-08 DIAGNOSIS — M1811 Unilateral primary osteoarthritis of first carpometacarpal joint, right hand: Secondary | ICD-10-CM | POA: Diagnosis not present

## 2019-03-08 DIAGNOSIS — M19011 Primary osteoarthritis, right shoulder: Secondary | ICD-10-CM | POA: Diagnosis not present

## 2019-03-13 ENCOUNTER — Encounter: Payer: Self-pay | Admitting: Family

## 2019-03-13 ENCOUNTER — Other Ambulatory Visit: Payer: Self-pay

## 2019-03-13 ENCOUNTER — Telehealth: Payer: Self-pay | Admitting: Endocrinology

## 2019-03-13 ENCOUNTER — Ambulatory Visit (INDEPENDENT_AMBULATORY_CARE_PROVIDER_SITE_OTHER): Payer: Medicare Other | Admitting: Family

## 2019-03-13 VITALS — BP 145/89 | HR 78 | Temp 97.3°F | Resp 16 | Ht 73.0 in

## 2019-03-13 DIAGNOSIS — Z7901 Long term (current) use of anticoagulants: Secondary | ICD-10-CM | POA: Diagnosis not present

## 2019-03-13 DIAGNOSIS — E662 Morbid (severe) obesity with alveolar hypoventilation: Secondary | ICD-10-CM

## 2019-03-13 DIAGNOSIS — Z86718 Personal history of other venous thrombosis and embolism: Secondary | ICD-10-CM | POA: Diagnosis not present

## 2019-03-13 DIAGNOSIS — J0101 Acute recurrent maxillary sinusitis: Secondary | ICD-10-CM

## 2019-03-13 DIAGNOSIS — N183 Chronic kidney disease, stage 3 (moderate): Secondary | ICD-10-CM | POA: Diagnosis not present

## 2019-03-13 DIAGNOSIS — E1122 Type 2 diabetes mellitus with diabetic chronic kidney disease: Secondary | ICD-10-CM | POA: Diagnosis not present

## 2019-03-13 DIAGNOSIS — Z86711 Personal history of pulmonary embolism: Secondary | ICD-10-CM

## 2019-03-13 DIAGNOSIS — I129 Hypertensive chronic kidney disease with stage 1 through stage 4 chronic kidney disease, or unspecified chronic kidney disease: Secondary | ICD-10-CM | POA: Diagnosis not present

## 2019-03-13 DIAGNOSIS — E1165 Type 2 diabetes mellitus with hyperglycemia: Secondary | ICD-10-CM

## 2019-03-13 LAB — COAGUCHEK XS/INR WAIVED
INR: 1.9 — ABNORMAL HIGH (ref 0.9–1.1)
Prothrombin Time: 23.3 s

## 2019-03-13 MED ORDER — DOXYCYCLINE HYCLATE 100 MG PO TABS: 100.0000 mg | ORAL_TABLET | Freq: Two times a day (BID) | ORAL | 0 refills | Status: DC

## 2019-03-13 NOTE — Telephone Encounter (Signed)
Per Dr. Loanne Drilling, only able to send refill of Humalog for a 30 day supply. Unable to fill for 90 day supply without an appt. Routing this message to the front desk for scheduling purposes.

## 2019-03-13 NOTE — Telephone Encounter (Signed)
LOV 10/31/18. Per your note: Please come back for a follow-up appointment in 2 months, when we'll be due to recheck the testosterone. No future appt noted. Please advise

## 2019-03-13 NOTE — Telephone Encounter (Signed)
Please refill x 1 F/u is due  

## 2019-03-13 NOTE — Patient Instructions (Signed)
Sinusitis, Adult Sinusitis is inflammation of your sinuses. Sinuses are hollow spaces in the bones around your face. Your sinuses are located:  Around your eyes.  In the middle of your forehead.  Behind your nose.  In your cheekbones. Mucus normally drains out of your sinuses. When your nasal tissues become inflamed or swollen, mucus can become trapped or blocked. This allows bacteria, viruses, and fungi to grow, which leads to infection. Most infections of the sinuses are caused by a virus. Sinusitis can develop quickly. It can last for up to 4 weeks (acute) or for more than 12 weeks (chronic). Sinusitis often develops after a cold. What are the causes? This condition is caused by anything that creates swelling in the sinuses or stops mucus from draining. This includes:  Allergies.  Asthma.  Infection from bacteria or viruses.  Deformities or blockages in your nose or sinuses.  Abnormal growths in the nose (nasal polyps).  Pollutants, such as chemicals or irritants in the air.  Infection from fungi (rare). What increases the risk? You are more likely to develop this condition if you:  Have a weak body defense system (immune system).  Do a lot of swimming or diving.  Overuse nasal sprays.  Smoke. What are the signs or symptoms? The main symptoms of this condition are pain and a feeling of pressure around the affected sinuses. Other symptoms include:  Stuffy nose or congestion.  Thick drainage from your nose.  Swelling and warmth over the affected sinuses.  Headache.  Upper toothache.  A cough that may get worse at night.  Extra mucus that collects in the throat or the back of the nose (postnasal drip).  Decreased sense of smell and taste.  Fatigue.  A fever.  Sore throat.  Bad breath. How is this diagnosed? This condition is diagnosed based on:  Your symptoms.  Your medical history.  A physical exam.  Tests to find out if your condition is  acute or chronic. This may include: ? Checking your nose for nasal polyps. ? Viewing your sinuses using a device that has a light (endoscope). ? Testing for allergies or bacteria. ? Imaging tests, such as an MRI or CT scan. In rare cases, a bone biopsy may be done to rule out more serious types of fungal sinus disease. How is this treated? Treatment for sinusitis depends on the cause and whether your condition is chronic or acute.  If caused by a virus, your symptoms should go away on their own within 10 days. You may be given medicines to relieve symptoms. They include: ? Medicines that shrink swollen nasal passages (topical intranasal decongestants). ? Medicines that treat allergies (antihistamines). ? A spray that eases inflammation of the nostrils (topical intranasal corticosteroids). ? Rinses that help get rid of thick mucus in your nose (nasal saline washes).  If caused by bacteria, your health care provider may recommend waiting to see if your symptoms improve. Most bacterial infections will get better without antibiotic medicine. You may be given antibiotics if you have: ? A severe infection. ? A weak immune system.  If caused by narrow nasal passages or nasal polyps, you may need to have surgery. Follow these instructions at home: Medicines  Take, use, or apply over-the-counter and prescription medicines only as told by your health care provider. These may include nasal sprays.  If you were prescribed an antibiotic medicine, take it as told by your health care provider. Do not stop taking the antibiotic even if you start   to feel better. Hydrate and humidify   Drink enough fluid to keep your urine pale yellow. Staying hydrated will help to thin your mucus.  Use a cool mist humidifier to keep the humidity level in your home above 50%.  Inhale steam for 10-15 minutes, 3-4 times a day, or as told by your health care provider. You can do this in the bathroom while a hot shower is  running.  Limit your exposure to cool or dry air. Rest  Rest as much as possible.  Sleep with your head raised (elevated).  Make sure you get enough sleep each night. General instructions   Apply a warm, moist washcloth to your face 3-4 times a day or as told by your health care provider. This will help with discomfort.  Wash your hands often with soap and water to reduce your exposure to germs. If soap and water are not available, use hand sanitizer.  Do not smoke. Avoid being around people who are smoking (secondhand smoke).  Keep all follow-up visits as told by your health care provider. This is important. Contact a health care provider if:  You have a fever.  Your symptoms get worse.  Your symptoms do not improve within 10 days. Get help right away if:  You have a severe headache.  You have persistent vomiting.  You have severe pain or swelling around your face or eyes.  You have vision problems.  You develop confusion.  Your neck is stiff.  You have trouble breathing. Summary  Sinusitis is soreness and inflammation of your sinuses. Sinuses are hollow spaces in the bones around your face.  This condition is caused by nasal tissues that become inflamed or swollen. The swelling traps or blocks the flow of mucus. This allows bacteria, viruses, and fungi to grow, which leads to infection.  If you were prescribed an antibiotic medicine, take it as told by your health care provider. Do not stop taking the antibiotic even if you start to feel better.  Keep all follow-up visits as told by your health care provider. This is important. This information is not intended to replace advice given to you by your health care provider. Make sure you discuss any questions you have with your health care provider. Document Released: 06/15/2005 Document Revised: 11/15/2017 Document Reviewed: 11/15/2017 Elsevier Patient Education  2020 [employer]  

## 2019-03-13 NOTE — Progress Notes (Signed)
Subjective:    Patient ID: [name], male    DOB: 1965-07-05, 53 y.o.   MRN: [id number]  Chief Complaint  Patient presents with  . protime recheck   Pt presents to the office today for PT and INR. His INR today is 1.9. He denies any medications changes, or missed doses.   He states he is followed by the Oncologists. And had a PET scan on 02/24/19 that showed Chronic left maxillary sinusitis.  Sinusitis This is a recurrent problem. The current episode started 1 to 4 weeks ago. The problem has been waxing and waning since onset. There has been no fever. His pain is at a severity of 8/10. The pain is moderate. Associated symptoms include chills, congestion, ear pain, headaches, a hoarse voice, shortness of breath, sinus pressure, sneezing and a sore throat. Pertinent negatives include no coughing. Past treatments include oral decongestants and spray decongestants. The treatment provided mild relief.     Review of Systems  Constitutional: Positive for chills.  HENT: Positive for congestion, ear pain, hoarse voice, sinus pressure, sneezing and sore throat.   Respiratory: Positive for shortness of breath. Negative for cough.   Neurological: Positive for headaches.  All other systems reviewed and are negative.      Objective:   Physical Exam Vitals signs reviewed.  Constitutional:      General: He is not in acute distress.    Appearance: He is well-developed. He is obese.  HENT:     Head: Normocephalic.     Right Ear: Tympanic membrane normal.     Left Ear: A middle ear effusion is present.     Mouth/Throat:     Pharynx: Posterior oropharyngeal erythema present.  Eyes:     General:        Right eye: No discharge.        Left eye: No discharge.     Pupils: Pupils are equal, round, and reactive to light.  Neck:     Musculoskeletal: Normal range of motion and neck supple.     Thyroid: No thyromegaly.  Cardiovascular:     Rate and Rhythm: Normal rate and regular rhythm.      Heart sounds: Normal heart sounds. No murmur.  Pulmonary:     Effort: Pulmonary effort is normal. No respiratory distress.     Breath sounds: Normal breath sounds. No wheezing.  Abdominal:     General: Bowel sounds are normal. There is no distension.     Palpations: Abdomen is soft.     Tenderness: There is no abdominal tenderness.  Musculoskeletal: Normal range of motion.        General: No tenderness.  Skin:    General: Skin is warm and dry.     Findings: No erythema or rash.  Neurological:     Mental Status: He is alert and oriented to person, place, and time.     Cranial Nerves: No cranial nerve deficit.     Deep Tendon Reflexes: Reflexes are normal and symmetric.  Psychiatric:        Behavior: Behavior normal.        Thought Content: Thought content normal.        Judgment: Judgment normal.        BP (!) 145/89   Pulse 78   Temp (!) 97.3 F (36.3 C) (Temporal)   Resp 16   Ht 6\' 1"  (1.854 m)   SpO2 94%   BMI 59.68 kg/m   Assessment & Plan:  [name] comes in today with chief complaint of protime recheck   Diagnosis and orders addressed:  1. Chronic anticoagulation Description   Given recent nose bleed we will decrease to  5 mg (1 tablet) every day.   INR today was 2.9 (at goal) and your goal is 2.0-3.0     Will no adjust warfarin today as we are prescribing doxycyline for him.   - CoaguChek XS/INR Waived  2. Acute recurrent maxillary sinusitis Pt will go get COVID tested to rule out Continue zyrtec, flonase daily Start doxycycline  Force fluids Rest - doxycycline (VIBRA-TABS) 100 MG tablet; Take 1 tablet (100 mg total) by mouth 2 (two) times daily.  Dispense: 20 tablet; Refill: 0  3. History of DVT (deep vein thrombosis)  4. Obesity hypoventilation syndrome ([place])  5. History of pulmonary embolism  Evelina Dun, FNP

## 2019-03-14 DIAGNOSIS — J449 Chronic obstructive pulmonary disease, unspecified: Secondary | ICD-10-CM | POA: Diagnosis not present

## 2019-03-14 DIAGNOSIS — G8194 Hemiplegia, unspecified affecting left nondominant side: Secondary | ICD-10-CM | POA: Diagnosis not present

## 2019-03-14 DIAGNOSIS — G4733 Obstructive sleep apnea (adult) (pediatric): Secondary | ICD-10-CM | POA: Diagnosis not present

## 2019-03-14 NOTE — Telephone Encounter (Signed)
Patient has been scheduled for a follow up appointment on Monday 04/03/2019 at 230PM

## 2019-03-15 DIAGNOSIS — G894 Chronic pain syndrome: Secondary | ICD-10-CM | POA: Diagnosis not present

## 2019-03-15 DIAGNOSIS — Z1159 Encounter for screening for other viral diseases: Secondary | ICD-10-CM | POA: Diagnosis not present

## 2019-03-15 DIAGNOSIS — Z79899 Other long term (current) drug therapy: Secondary | ICD-10-CM | POA: Diagnosis not present

## 2019-03-15 DIAGNOSIS — M549 Dorsalgia, unspecified: Secondary | ICD-10-CM | POA: Diagnosis not present

## 2019-03-23 DIAGNOSIS — G8194 Hemiplegia, unspecified affecting left nondominant side: Secondary | ICD-10-CM | POA: Diagnosis not present

## 2019-03-23 DIAGNOSIS — I279 Pulmonary heart disease, unspecified: Secondary | ICD-10-CM | POA: Diagnosis not present

## 2019-03-23 DIAGNOSIS — J449 Chronic obstructive pulmonary disease, unspecified: Secondary | ICD-10-CM | POA: Diagnosis not present

## 2019-03-24 ENCOUNTER — Other Ambulatory Visit: Payer: Self-pay | Admitting: Family

## 2019-04-03 ENCOUNTER — Ambulatory Visit: Payer: Medicare Other | Admitting: Endocrinology

## 2019-04-04 ENCOUNTER — Ambulatory Visit: Payer: Medicare Other | Admitting: Endocrinology

## 2019-04-05 ENCOUNTER — Other Ambulatory Visit: Payer: Self-pay | Admitting: Family

## 2019-04-05 DIAGNOSIS — J189 Pneumonia, unspecified organism: Secondary | ICD-10-CM

## 2019-04-09 ENCOUNTER — Other Ambulatory Visit: Payer: Self-pay

## 2019-04-09 ENCOUNTER — Emergency Department (HOSPITAL_COMMUNITY): Payer: Medicare Other

## 2019-04-09 ENCOUNTER — Inpatient Hospital Stay (HOSPITAL_COMMUNITY)
Admission: EM | Admit: 2019-04-09 | Discharge: 2019-04-25 | DRG: 177 | Disposition: A | Discharge status: Home Health | Payer: Medicare Other | Attending: Internal Medicine | Admitting: Internal Medicine

## 2019-04-09 ENCOUNTER — Encounter (HOSPITAL_COMMUNITY): Payer: Self-pay | Admitting: Emergency Medicine

## 2019-04-09 DIAGNOSIS — G4733 Obstructive sleep apnea (adult) (pediatric): Secondary | ICD-10-CM | POA: Diagnosis present

## 2019-04-09 DIAGNOSIS — Z86711 Personal history of pulmonary embolism: Secondary | ICD-10-CM | POA: Diagnosis present

## 2019-04-09 DIAGNOSIS — N179 Acute kidney failure, unspecified: Secondary | ICD-10-CM | POA: Diagnosis present

## 2019-04-09 DIAGNOSIS — F419 Anxiety disorder, unspecified: Secondary | ICD-10-CM | POA: Diagnosis present

## 2019-04-09 DIAGNOSIS — Z79891 Long term (current) use of opiate analgesic: Secondary | ICD-10-CM

## 2019-04-09 DIAGNOSIS — R569 Unspecified convulsions: Secondary | ICD-10-CM

## 2019-04-09 DIAGNOSIS — F319 Bipolar disorder, unspecified: Secondary | ICD-10-CM | POA: Diagnosis present

## 2019-04-09 DIAGNOSIS — Z885 Allergy status to narcotic agent status: Secondary | ICD-10-CM

## 2019-04-09 DIAGNOSIS — G8194 Hemiplegia, unspecified affecting left nondominant side: Secondary | ICD-10-CM | POA: Diagnosis not present

## 2019-04-09 DIAGNOSIS — E1165 Type 2 diabetes mellitus with hyperglycemia: Secondary | ICD-10-CM | POA: Diagnosis not present

## 2019-04-09 DIAGNOSIS — Z888 Allergy status to other drugs, medicaments and biological substances status: Secondary | ICD-10-CM

## 2019-04-09 DIAGNOSIS — I2690 Septic pulmonary embolism without acute cor pulmonale: Secondary | ICD-10-CM | POA: Diagnosis not present

## 2019-04-09 DIAGNOSIS — R05 Cough: Secondary | ICD-10-CM | POA: Diagnosis not present

## 2019-04-09 DIAGNOSIS — N400 Enlarged prostate without lower urinary tract symptoms: Secondary | ICD-10-CM | POA: Diagnosis present

## 2019-04-09 DIAGNOSIS — Z91013 Allergy to seafood: Secondary | ICD-10-CM

## 2019-04-09 DIAGNOSIS — H9190 Unspecified hearing loss, unspecified ear: Secondary | ICD-10-CM | POA: Diagnosis present

## 2019-04-09 DIAGNOSIS — Z86718 Personal history of other venous thrombosis and embolism: Secondary | ICD-10-CM

## 2019-04-09 DIAGNOSIS — R0603 Acute respiratory distress: Secondary | ICD-10-CM | POA: Diagnosis not present

## 2019-04-09 DIAGNOSIS — Z833 Family history of diabetes mellitus: Secondary | ICD-10-CM

## 2019-04-09 DIAGNOSIS — J449 Chronic obstructive pulmonary disease, unspecified: Secondary | ICD-10-CM | POA: Diagnosis not present

## 2019-04-09 DIAGNOSIS — Z91030 Bee allergy status: Secondary | ICD-10-CM

## 2019-04-09 DIAGNOSIS — J984 Other disorders of lung: Secondary | ICD-10-CM | POA: Diagnosis not present

## 2019-04-09 DIAGNOSIS — G40909 Epilepsy, unspecified, not intractable, without status epilepticus: Secondary | ICD-10-CM | POA: Diagnosis present

## 2019-04-09 DIAGNOSIS — L89150 Pressure ulcer of sacral region, unstageable: Secondary | ICD-10-CM | POA: Diagnosis not present

## 2019-04-09 DIAGNOSIS — Z9049 Acquired absence of other specified parts of digestive tract: Secondary | ICD-10-CM

## 2019-04-09 DIAGNOSIS — J1289 Other viral pneumonia: Secondary | ICD-10-CM | POA: Diagnosis present

## 2019-04-09 DIAGNOSIS — Z7901 Long term (current) use of anticoagulants: Secondary | ICD-10-CM

## 2019-04-09 DIAGNOSIS — M2550 Pain in unspecified joint: Secondary | ICD-10-CM | POA: Diagnosis not present

## 2019-04-09 DIAGNOSIS — E1140 Type 2 diabetes mellitus with diabetic neuropathy, unspecified: Secondary | ICD-10-CM | POA: Diagnosis present

## 2019-04-09 DIAGNOSIS — J9611 Chronic respiratory failure with hypoxia: Secondary | ICD-10-CM | POA: Diagnosis present

## 2019-04-09 DIAGNOSIS — N170 Acute kidney failure with tubular necrosis: Secondary | ICD-10-CM | POA: Diagnosis not present

## 2019-04-09 DIAGNOSIS — F1120 Opioid dependence, uncomplicated: Secondary | ICD-10-CM

## 2019-04-09 DIAGNOSIS — E662 Morbid (severe) obesity with alveolar hypoventilation: Secondary | ICD-10-CM | POA: Diagnosis present

## 2019-04-09 DIAGNOSIS — Z6841 Body Mass Index (BMI) 40.0 and over, adult: Secondary | ICD-10-CM

## 2019-04-09 DIAGNOSIS — E1122 Type 2 diabetes mellitus with diabetic chronic kidney disease: Secondary | ICD-10-CM | POA: Diagnosis not present

## 2019-04-09 DIAGNOSIS — Z66 Do not resuscitate: Secondary | ICD-10-CM | POA: Diagnosis not present

## 2019-04-09 DIAGNOSIS — E118 Type 2 diabetes mellitus with unspecified complications: Secondary | ICD-10-CM | POA: Diagnosis not present

## 2019-04-09 DIAGNOSIS — R0902 Hypoxemia: Secondary | ICD-10-CM

## 2019-04-09 DIAGNOSIS — Z880 Allergy status to penicillin: Secondary | ICD-10-CM

## 2019-04-09 DIAGNOSIS — Z0289 Encounter for other administrative examinations: Secondary | ICD-10-CM

## 2019-04-09 DIAGNOSIS — R0602 Shortness of breath: Secondary | ICD-10-CM | POA: Diagnosis not present

## 2019-04-09 DIAGNOSIS — Z9981 Dependence on supplemental oxygen: Secondary | ICD-10-CM

## 2019-04-09 DIAGNOSIS — Z91040 Latex allergy status: Secondary | ICD-10-CM

## 2019-04-09 DIAGNOSIS — G894 Chronic pain syndrome: Secondary | ICD-10-CM | POA: Diagnosis present

## 2019-04-09 DIAGNOSIS — Z8261 Family history of arthritis: Secondary | ICD-10-CM

## 2019-04-09 DIAGNOSIS — D696 Thrombocytopenia, unspecified: Secondary | ICD-10-CM | POA: Diagnosis not present

## 2019-04-09 DIAGNOSIS — R339 Retention of urine, unspecified: Secondary | ICD-10-CM | POA: Diagnosis not present

## 2019-04-09 DIAGNOSIS — Z794 Long term (current) use of insulin: Secondary | ICD-10-CM

## 2019-04-09 DIAGNOSIS — M1990 Unspecified osteoarthritis, unspecified site: Secondary | ICD-10-CM | POA: Diagnosis not present

## 2019-04-09 DIAGNOSIS — I82402 Acute embolism and thrombosis of unspecified deep veins of left lower extremity: Secondary | ICD-10-CM | POA: Diagnosis not present

## 2019-04-09 DIAGNOSIS — I279 Pulmonary heart disease, unspecified: Secondary | ICD-10-CM | POA: Diagnosis not present

## 2019-04-09 DIAGNOSIS — Z79899 Other long term (current) drug therapy: Secondary | ICD-10-CM

## 2019-04-09 DIAGNOSIS — U071 COVID-19: Principal | ICD-10-CM | POA: Diagnosis present

## 2019-04-09 DIAGNOSIS — E872 Acidosis: Secondary | ICD-10-CM | POA: Diagnosis not present

## 2019-04-09 DIAGNOSIS — J9621 Acute and chronic respiratory failure with hypoxia: Secondary | ICD-10-CM | POA: Diagnosis not present

## 2019-04-09 DIAGNOSIS — Z91041 Radiographic dye allergy status: Secondary | ICD-10-CM

## 2019-04-09 DIAGNOSIS — L8990 Pressure ulcer of unspecified site, unspecified stage: Secondary | ICD-10-CM | POA: Insufficient documentation

## 2019-04-09 DIAGNOSIS — J80 Acute respiratory distress syndrome: Secondary | ICD-10-CM | POA: Diagnosis not present

## 2019-04-09 DIAGNOSIS — J1282 Pneumonia due to coronavirus disease 2019: Secondary | ICD-10-CM | POA: Diagnosis present

## 2019-04-09 DIAGNOSIS — K219 Gastro-esophageal reflux disease without esophagitis: Secondary | ICD-10-CM | POA: Diagnosis not present

## 2019-04-09 DIAGNOSIS — N1832 Chronic kidney disease, stage 3b: Secondary | ICD-10-CM | POA: Diagnosis present

## 2019-04-09 DIAGNOSIS — Z9119 Patient's noncompliance with other medical treatment and regimen: Secondary | ICD-10-CM

## 2019-04-09 DIAGNOSIS — Z7401 Bed confinement status: Secondary | ICD-10-CM | POA: Diagnosis not present

## 2019-04-09 DIAGNOSIS — I469 Cardiac arrest, cause unspecified: Secondary | ICD-10-CM | POA: Diagnosis not present

## 2019-04-09 DIAGNOSIS — E785 Hyperlipidemia, unspecified: Secondary | ICD-10-CM | POA: Diagnosis present

## 2019-04-09 DIAGNOSIS — IMO0002 Reserved for concepts with insufficient information to code with codable children: Secondary | ICD-10-CM | POA: Diagnosis present

## 2019-04-09 DIAGNOSIS — Z800 Family history of malignant neoplasm of digestive organs: Secondary | ICD-10-CM

## 2019-04-09 DIAGNOSIS — Z743 Need for continuous supervision: Secondary | ICD-10-CM | POA: Diagnosis not present

## 2019-04-09 DIAGNOSIS — I959 Hypotension, unspecified: Secondary | ICD-10-CM | POA: Diagnosis not present

## 2019-04-09 LAB — TRIGLYCERIDES: Triglycerides: 164 mg/dL — ABNORMAL HIGH (ref <150)

## 2019-04-09 LAB — COMPREHENSIVE METABOLIC PANEL
ALT: 28 U/L (ref 0–44)
AST: 82 U/L — ABNORMAL HIGH (ref 15–41)
Albumin: 2.3 g/dL — ABNORMAL LOW (ref 3.5–5.0)
Alkaline Phosphatase: 74 U/L (ref 38–126)
Anion gap: 14 (ref 5–15)
BUN: 72 mg/dL — ABNORMAL HIGH (ref 6–20)
CO2: 24 mmol/L (ref 22–32)
Calcium: 7.6 mg/dL — ABNORMAL LOW (ref 8.9–10.3)
Chloride: 95 mmol/L — ABNORMAL LOW (ref 98–111)
Creatinine, Ser: 4.84 mg/dL — ABNORMAL HIGH (ref 0.61–1.24)
GFR calc Af Amer: 15 mL/min — ABNORMAL LOW (ref >60)
GFR calc non Af Amer: 13 mL/min — ABNORMAL LOW (ref >60)
Glucose, Bld: 212 mg/dL — ABNORMAL HIGH (ref 70–99)
Potassium: 3.4 mmol/L — ABNORMAL LOW (ref 3.5–5.1)
Sodium: 133 mmol/L — ABNORMAL LOW (ref 135–145)
Total Bilirubin: 0.6 mg/dL (ref 0.3–1.2)
Total Protein: 6.7 g/dL (ref 6.5–8.1)

## 2019-04-09 LAB — BLOOD GAS, ARTERIAL
Acid-Base Excess: 2.1 mmol/L — ABNORMAL HIGH (ref 0.0–2.0)
Bicarbonate: 25.9 mmol/L (ref 20.0–28.0)
FIO2: 100
O2 Saturation: 97.8 %
Patient temperature: 36.9
pCO2 arterial: 48 mmHg (ref 32.0–48.0)
pH, Arterial: 7.368 (ref 7.350–7.450)
pO2, Arterial: 111 mmHg — ABNORMAL HIGH (ref 83.0–108.0)

## 2019-04-09 LAB — CBC WITH DIFFERENTIAL/PLATELET
Abs Immature Granulocytes: 0.06 10*3/uL (ref 0.00–0.07)
Basophils Absolute: 0 10*3/uL (ref 0.0–0.1)
Basophils Relative: 0 %
Eosinophils Absolute: 0 10*3/uL (ref 0.0–0.5)
Eosinophils Relative: 1 %
HCT: 34.8 % — ABNORMAL LOW (ref 39.0–52.0)
Hemoglobin: 11.2 g/dL — ABNORMAL LOW (ref 13.0–17.0)
Immature Granulocytes: 1 %
Lymphocytes Relative: 19 %
Lymphs Abs: 1.1 10*3/uL (ref 0.7–4.0)
MCH: 30.4 pg (ref 26.0–34.0)
MCHC: 32.2 g/dL (ref 30.0–36.0)
MCV: 94.3 fL (ref 80.0–100.0)
Monocytes Absolute: 0.4 10*3/uL (ref 0.1–1.0)
Monocytes Relative: 6 %
Neutro Abs: 4.4 10*3/uL (ref 1.7–7.7)
Neutrophils Relative %: 73 %
Platelets: 104 10*3/uL — ABNORMAL LOW (ref 150–400)
RBC: 3.69 MIL/uL — ABNORMAL LOW (ref 4.22–5.81)
RDW: 13.7 % (ref 11.5–15.5)
WBC: 5.9 10*3/uL (ref 4.0–10.5)
nRBC: 0.8 % — ABNORMAL HIGH (ref 0.0–0.2)

## 2019-04-09 LAB — FIBRINOGEN: Fibrinogen: 634 mg/dL — ABNORMAL HIGH (ref 210–475)

## 2019-04-09 LAB — PROTIME-INR
INR: 3.8 — ABNORMAL HIGH (ref 0.8–1.2)
Prothrombin Time: 36.5 seconds — ABNORMAL HIGH (ref 11.4–15.2)

## 2019-04-09 LAB — LACTIC ACID, PLASMA
Lactic Acid, Venous: 2.9 mmol/L (ref 0.5–1.9)
Lactic Acid, Venous: 2 mmol/L (ref 0.5–1.9)

## 2019-04-09 LAB — D-DIMER, QUANTITATIVE: D-Dimer, Quant: 0.8 ug/mL-FEU — ABNORMAL HIGH (ref 0.00–0.50)

## 2019-04-09 LAB — PROCALCITONIN: Procalcitonin: 1.42 ng/mL

## 2019-04-09 LAB — FERRITIN: Ferritin: 424 ng/mL — ABNORMAL HIGH (ref 24–336)

## 2019-04-09 LAB — C-REACTIVE PROTEIN: CRP: 12.7 mg/dL — ABNORMAL HIGH (ref <1.0)

## 2019-04-09 LAB — LACTATE DEHYDROGENASE: LDH: 411 U/L — ABNORMAL HIGH (ref 98–192)

## 2019-04-09 LAB — MAGNESIUM: Magnesium: 1.8 mg/dL (ref 1.7–2.4)

## 2019-04-09 LAB — SARS CORONAVIRUS 2 BY RT PCR (HOSPITAL ORDER, PERFORMED IN ~~LOC~~ HOSPITAL LAB): SARS Coronavirus 2: POSITIVE — AB

## 2019-04-09 MED ORDER — POLYETHYLENE GLYCOL 3350 17 G PO PACK: 17.0000 g | PACK | Freq: Every day | ORAL | Status: DC | PRN

## 2019-04-09 MED ORDER — FUROSEMIDE 40 MG PO TABS
80.0000 mg | ORAL_TABLET | Freq: Two times a day (BID) | ORAL | Status: DC
  Administered 2019-04-10 – 2019-04-23 (×27): 80 mg via ORAL
  Filled 2019-04-09 (×5): qty 2
  Filled 2019-04-09 (×2): qty 4
  Filled 2019-04-09 (×7): qty 2
  Filled 2019-04-09 (×2): qty 4
  Filled 2019-04-09 (×7): qty 2
  Filled 2019-04-09: qty 4
  Filled 2019-04-09 (×2): qty 2
  Filled 2019-04-09 (×2): qty 4
  Filled 2019-04-09: qty 2
  Filled 2019-04-09: qty 4
  Filled 2019-04-09 (×2): qty 2
  Filled 2019-04-09 (×4): qty 4
  Filled 2019-04-09 (×3): qty 2
  Filled 2019-04-09: qty 4
  Filled 2019-04-09 (×3): qty 2
  Filled 2019-04-09: qty 4
  Filled 2019-04-09 (×3): qty 2

## 2019-04-09 MED ORDER — CHLORHEXIDINE GLUCONATE CLOTH 2 % EX PADS
6.0000 | MEDICATED_PAD | Freq: Every day | CUTANEOUS | Status: DC
  Administered 2019-04-11 – 2019-04-25 (×14): 6 via TOPICAL

## 2019-04-09 MED ORDER — INSULIN ASPART 100 UNIT/ML ~~LOC~~ SOLN
0.0000 [IU] | SUBCUTANEOUS | Status: DC
  Administered 2019-04-10: 01:00:00 11 [IU] via SUBCUTANEOUS
  Administered 2019-04-10: 15 [IU] via SUBCUTANEOUS
  Administered 2019-04-10: 11 [IU] via SUBCUTANEOUS
  Administered 2019-04-10: 20:00:00 15 [IU] via SUBCUTANEOUS
  Administered 2019-04-10: 08:00:00 7 [IU] via SUBCUTANEOUS
  Administered 2019-04-10 (×2): 11 [IU] via SUBCUTANEOUS
  Administered 2019-04-11: 15 [IU] via SUBCUTANEOUS
  Administered 2019-04-11: 7 [IU] via SUBCUTANEOUS
  Administered 2019-04-11: 11 [IU] via SUBCUTANEOUS
  Administered 2019-04-11: 7 [IU] via SUBCUTANEOUS
  Administered 2019-04-11 (×2): 20 [IU] via SUBCUTANEOUS
  Administered 2019-04-12 (×2): 11 [IU] via SUBCUTANEOUS

## 2019-04-09 MED ORDER — PREGABALIN 50 MG PO CAPS
50.0000 mg | ORAL_CAPSULE | Freq: Three times a day (TID) | ORAL | Status: DC
  Administered 2019-04-10 – 2019-04-25 (×47): 50 mg via ORAL
  Filled 2019-04-09 (×47): qty 1

## 2019-04-09 MED ORDER — LORATADINE 10 MG PO TABS
10.0000 mg | ORAL_TABLET | Freq: Every day | ORAL | Status: DC
  Administered 2019-04-10 – 2019-04-25 (×16): 10 mg via ORAL
  Filled 2019-04-09 (×17): qty 1

## 2019-04-09 MED ORDER — INSULIN GLARGINE 100 UNIT/ML ~~LOC~~ SOLN
100.0000 [IU] | Freq: Every day | SUBCUTANEOUS | Status: DC
  Administered 2019-04-10 – 2019-04-12 (×3): 100 [IU] via SUBCUTANEOUS
  Filled 2019-04-09 (×3): qty 1

## 2019-04-09 MED ORDER — ACETAMINOPHEN 325 MG PO TABS: 650.0000 mg | ORAL_TABLET | Freq: Four times a day (QID) | ORAL | Status: DC | PRN

## 2019-04-09 MED ORDER — DEXAMETHASONE SODIUM PHOSPHATE 10 MG/ML IJ SOLN
6.0000 mg | INTRAMUSCULAR | Status: DC
  Administered 2019-04-09 – 2019-04-13 (×5): 6 mg via INTRAVENOUS
  Filled 2019-04-09 (×5): qty 1

## 2019-04-09 MED ORDER — POTASSIUM CHLORIDE CRYS ER 20 MEQ PO TBCR
40.0000 meq | EXTENDED_RELEASE_TABLET | Freq: Every day | ORAL | Status: DC
  Administered 2019-04-10 – 2019-04-22 (×13): 40 meq via ORAL
  Filled 2019-04-09 (×14): qty 2

## 2019-04-09 MED ORDER — ONDANSETRON HCL 4 MG/2ML IJ SOLN: 4.0000 mg | Freq: Four times a day (QID) | INTRAMUSCULAR | Status: DC | PRN

## 2019-04-09 MED ORDER — ONDANSETRON HCL 4 MG PO TABS: 4.0000 mg | ORAL_TABLET | Freq: Four times a day (QID) | ORAL | Status: DC | PRN

## 2019-04-09 MED ORDER — ALBUTEROL SULFATE HFA 108 (90 BASE) MCG/ACT IN AERS
2.0000 | INHALATION_SPRAY | RESPIRATORY_TRACT | Status: DC | PRN
  Administered 2019-04-10: 2 via RESPIRATORY_TRACT
  Filled 2019-04-09: qty 6.7

## 2019-04-09 MED ORDER — LEVETIRACETAM 500 MG PO TABS
1000.0000 mg | ORAL_TABLET | Freq: Two times a day (BID) | ORAL | Status: DC
  Administered 2019-04-10 – 2019-04-25 (×32): 1000 mg via ORAL
  Filled 2019-04-09 (×2): qty 2
  Filled 2019-04-09: qty 4
  Filled 2019-04-09 (×32): qty 2
  Filled 2019-04-09: qty 4

## 2019-04-09 MED ORDER — DULOXETINE HCL 60 MG PO CPEP
60.0000 mg | ORAL_CAPSULE | Freq: Every day | ORAL | Status: DC
  Administered 2019-04-10 – 2019-04-25 (×16): 60 mg via ORAL
  Filled 2019-04-09 (×17): qty 1

## 2019-04-09 MED ORDER — METHYLPREDNISOLONE SODIUM SUCC 125 MG IJ SOLR: 80.0000 mg | Freq: Once | INTRAMUSCULAR | Status: DC

## 2019-04-09 MED ORDER — DM-GUAIFENESIN ER 30-600 MG PO TB12
1.0000 | ORAL_TABLET | Freq: Two times a day (BID) | ORAL | Status: DC
  Administered 2019-04-10 – 2019-04-25 (×32): 1 via ORAL
  Filled 2019-04-09 (×36): qty 1

## 2019-04-09 MED ORDER — TAMSULOSIN HCL 0.4 MG PO CAPS
0.4000 mg | ORAL_CAPSULE | Freq: Every day | ORAL | Status: DC
  Administered 2019-04-10 – 2019-04-25 (×16): 0.4 mg via ORAL
  Filled 2019-04-09 (×17): qty 1

## 2019-04-09 MED ORDER — PANTOPRAZOLE SODIUM 40 MG PO TBEC
40.0000 mg | DELAYED_RELEASE_TABLET | Freq: Every day | ORAL | Status: DC
  Administered 2019-04-10 – 2019-04-22 (×13): 40 mg via ORAL
  Filled 2019-04-09 (×13): qty 1

## 2019-04-09 MED ORDER — CANAGLIFLOZIN 100 MG PO TABS: 100.0000 mg | ORAL_TABLET | Freq: Every day | ORAL | Status: DC

## 2019-04-09 MED ORDER — BUSPIRONE HCL 15 MG PO TABS
15.0000 mg | ORAL_TABLET | Freq: Two times a day (BID) | ORAL | Status: DC
  Administered 2019-04-10 – 2019-04-25 (×32): 15 mg via ORAL
  Filled 2019-04-09 (×36): qty 1

## 2019-04-09 MED ORDER — SIMVASTATIN 20 MG PO TABS
20.0000 mg | ORAL_TABLET | Freq: Every day | ORAL | Status: DC
  Administered 2019-04-10 – 2019-04-24 (×16): 20 mg via ORAL
  Filled 2019-04-09 (×18): qty 1

## 2019-04-09 MED ORDER — LAMOTRIGINE 100 MG PO TABS
200.0000 mg | ORAL_TABLET | Freq: Two times a day (BID) | ORAL | Status: DC
  Administered 2019-04-10 – 2019-04-25 (×32): 200 mg via ORAL
  Filled 2019-04-09 (×36): qty 2

## 2019-04-09 NOTE — ED Notes (Signed)
Pt wife is rad tech at [employer] who tested positive for covid th is week  She is at home   Pt is on O2/2L all of the time- symptoms started last night worse today  Pt is morbidly obese and has great difficulty moving  Pt states home sats in the 58s  Wife - [name] [phone] Here for eval

## 2019-04-09 NOTE — ED Triage Notes (Addendum)
Wife known covid  Pt  on home O2/2L  Now with resp distress  Morbidly obese
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
## 2019-04-09 NOTE — ED Notes (Signed)
RT switching pt from bipap to non-rebreather- will assess pt respiratory status and update Dr Denton Brick with results.

## 2019-04-09 NOTE — ED Notes (Signed)
blood has gone to lab without extra tubes

## 2019-04-09 NOTE — ED Notes (Signed)
Call to wife [name] [phone]  Updated as to pt progress, informed that he has told us he does not wish to be intubated  She concurs he has a DNR at home   Dr Earnest Conroy informed

## 2019-04-09 NOTE — ED Notes (Signed)
CRITICAL VALUE ALERT  Critical Value:  Lactic Acid 2.0  Date & Time Notied:  04/09/19 1640  Provider Notified: Dr. Denton Brick  Orders Received/Actions taken: MD notified

## 2019-04-09 NOTE — ED Notes (Signed)
Rad at bedside.

## 2019-04-09 NOTE — ED Notes (Signed)
Call to spouse with update   [phone]

## 2019-04-09 NOTE — Progress Notes (Signed)
ANTICOAGULATION/ANTIBIOTIC CONSULT NOTE - Initial Consult  Pharmacy Consult for Warfarin and Remdesivir Indication: h/o DVT and PE, COVID-19  Allergies  Allergen Reactions  . Bee Venom Anaphylaxis, Shortness Of Breath and Swelling  . Other Shortness Of Breath    Itching, rash with IVP DYE, iodine, shellfish LATEX  . Penicillins Anaphylaxis and Shortness Of Breath    Has patient had a PCN reaction causing immediate rash, facial/tongue/throat swelling, SOB or lightheadedness with hypotension: Yes Has patient had a PCN reaction causing severe rash involving mucus membranes or skin necrosis: No Has patient had a PCN reaction that required hospitalization No Has patient had a PCN reaction occurring within the last 10 years: No If all of the above answers are "NO", then may proceed with Cephalosporin use.   . Shellfish Allergy Nausea And Vomiting and Other (See Comments)    Feels like insides are twisting  . Iodinated Diagnostic Agents     Other reaction(s): RASH  . Iohexol      Code: RASH, Desc: PT WAS ON PREDNISONE FOR GOUT TX. @ TIME OF SCAN AND RECEIVED 50 MG OF BENADRYL IV-ARS 10/08/07   . Iodine Rash  . Latex Rash    Patient Measurements: Height: 6\' 3"  (190.5 cm) Weight: (!) 425 lb (192.8 kg) IBW/kg (Calculated) : 84.5  Vital Signs: Temp: 98.5 F (36.9 C) (10/11 1223) Temp Source: Oral (10/11 1223) BP: 130/100 (10/11 2200) Pulse Rate: 80 (10/11 2200)  Labs: Recent Labs    04/09/19 1331  HGB 11.2*  HCT 34.8*  PLT 104*  LABPROT 36.5*  INR 3.8*  CREATININE 4.84*    Estimated Creatinine Clearance: 32.3 mL/min (A) (by C-G formula based on SCr of 4.84 mg/dL (H)).   Medical History: Past Medical History:  Diagnosis Date  . Anemia   . Anxiety   . Arthritis   . Bipolar 1 disorder ([place])   . Bronchitis    hx of  . Bruises easily   . Chronic kidney disease    decreased left kidney fx  . Chronic pain syndrome 05/11/2012  . Chronic respiratory failure with  hypoxia (HCC)    And with hypercapnia  . Diabetes mellitus without complication ([place])   . Diabetic neuropathy ([place])   . DVT (deep venous thrombosis) (HCC)    LLE DVT ~ '12  . Dyspnea    with ambulation  . GERD (gastroesophageal reflux disease)   . HOH (hard of hearing) 2015   has hearing aids  . Mental disorder   . Migraine   . Neuromuscular disorder ([place])   . Obesity hypoventilation syndrome ([place])   . Obstructive sleep apnea    CPAP  . PE (pulmonary embolism)    bilateral PE ~ '11  . Seizures ([place])   . Thrombocytopenia ([place]) 05/11/2012    Medications:  Medications Prior to Admission  Medication Sig Dispense Refill Last Dose  . ACCU-CHEK AVIVA PLUS test strip USE TO CHECK BLOOD SUGAR 4 TIMES A DAY 300 strip 5   . albuterol (VENTOLIN HFA) 108 (90 Base) MCG/ACT inhaler TAKE 2 PUFFS BY MOUTH EVERY 6 HOURS AS NEEDED FOR WHEEZE OR SHORTNESS OF BREATH 18 g 1   . B-D ULTRAFINE III SHORT PEN 31G X 8 MM MISC INJECT 1 PEN INTO THE SKIN 5 (FIVE) TIMES DAILY. 500 each 3   . busPIRone (BUSPAR) 15 MG tablet TAKE 1 TABLET (15 MG TOTAL) BY MOUTH 2 (TWO) TIMES DAILY. 180 tablet 0 04/09/2019 at Unknown time  . cetirizine (ZYRTEC) 10 MG  tablet Take 1 tablet (10 mg total) by mouth daily. 30 tablet 11 04/09/2019 at Unknown time  . clomiPHENE (CLOMID) 50 MG tablet Take 0.5 tablets (25 mg total) by mouth every other day. 24 tablet 3 04/09/2019 at Unknown time  . cyclobenzaprine (FLEXERIL) 10 MG tablet TAKE 1/2 TO 1 TABLET BY MOUTH AS NEEDED AT BEDTIME FOR MUSCLE CRAMPS / SPASMS 90 tablet 3   . DULoxetine (CYMBALTA) 60 MG capsule TAKE 1 CAPSULE BY MOUTH EVERY DAY 90 capsule 0 04/09/2019 at Unknown time  . EMGALITY 120 MG/ML SOAJ every 30 (thirty) days.    Past Month at Unknown time  . EPIPEN 2-PAK 0.3 MG/0.3ML SOAJ injection INJECT 0.3 MLS (0.3 MG TOTAL) INTO THE MUSCLE ONCE. AS NEEDED FOR ANAPHYLACTIC REACTION 2 Device 2   . esomeprazole (NEXIUM) 40 MG capsule TAKE 1 CAPSULE (40 MG TOTAL) BY MOUTH DAILY  AT 12 NOON. 90 capsule 0 04/09/2019 at Unknown time  . fluticasone (FLONASE) 50 MCG/ACT nasal spray PLACE 1 SPRAY INTO BOTH NOSTRILS 2 (TWO) TIMES DAILY AS NEEDED FOR ALLERGIES. 48 g 4 Past Week at Unknown time  . furosemide (LASIX) 80 MG tablet TAKE 1 TABLET (80 MG TOTAL) BY MOUTH 2 (TWO) TIMES DAILY. 180 tablet 0 04/09/2019 at Unknown time  . HYDROcodone-acetaminophen (NORCO) 7.5-325 MG tablet Take 1 tablet by mouth every 6 (six) hours as needed for moderate pain. 90 tablet 0 04/09/2019 at Unknown time  . Insulin Glargine, 1 Unit Dial, (TOUJEO SOLOSTAR) 300 UNIT/ML SOPN Inject 400 Units into the skin every morning. (Patient taking differently: Inject 400 Units into the skin every morning. 200 units daily) 130 pen 3 04/08/2019 at Unknown time  . insulin lispro (HUMALOG) 100 UNIT/ML KwikPen INJECT 50 UNITS INTO THE SKIN 3 (THREE) TIMES DAILY. OVERDUE FOR AN APPT. MUST CALL TO SCHEDULE 45 mL 0   . JARDIANCE 25 MG TABS tablet 25 mg daily.    04/09/2019 at Unknown time  . KLOR-CON M10 10 MEQ tablet TAKE 3 TABLETS (30 MEQ TOTAL) BY MOUTH DAILY. 270 tablet 0 04/09/2019 at Unknown time  . lamoTRIgine (LAMICTAL) 200 MG tablet TAKE 1 TABLET BY MOUTH TWICE A DAY 180 tablet 0 04/09/2019 at Unknown time  . levETIRAcetam (KEPPRA) 1000 MG tablet Take 1 tablet (1,000 mg total) by mouth 2 (two) times daily. 180 tablet 3 04/09/2019 at Unknown time  . pregabalin (LYRICA) 50 MG capsule TAKE 1 CAPSULE BY MOUTH THREE TIMES A DAY 270 capsule 1 04/09/2019 at Unknown time  . rizatriptan (MAXALT) 10 MG tablet May repeat in 2 hours if needed (Patient taking differently: Take 10 mg by mouth as needed. May repeat in 2 hours if needed) 10 tablet 11   . simvastatin (ZOCOR) 20 MG tablet Take 1 tablet (20 mg total) by mouth at bedtime. 90 tablet 3 04/08/2019 at Unknown time  . tamsulosin (FLOMAX) 0.4 MG CAPS capsule TAKE 1 CAPSULE BY MOUTH EVERY DAY 90 capsule 0 04/09/2019 at Unknown time  . TRULICITY 1.5 0000000 SOPN 1.5 mg once a  week.    Past Month at Unknown time  . warfarin (COUMADIN) 5 MG tablet TAKE 1 TO 1&1/2 TABLETS BY MOUTH DAILY AS DIRECTED BY ANTI-COAG CLINIC (Patient taking differently: 5 mg daily. TAKE 1 1/2 on Wednesday night) 135 tablet 0 04/08/2019 at 2100  . doxycycline (VIBRA-TABS) 100 MG tablet Take 1 tablet (100 mg total) by mouth 2 (two) times daily. (Patient not taking: Reported on 04/09/2019) 20 tablet 0 Completed Course at Unknown time  .  fentaNYL (DURAGESIC) 75 MCG/HR Place 1 patch onto the skin every 3 (three) days. 10 patch 0 04/07/2019 at 2100    Assessment: 53 y.o. M presents to hospital with SOB - found to have COVID 19.  AC: Coumadin PTA for h/o PE (2011) and DVT (2012). Admission INR supratherapeutic 3.8. Hgb 11.2, Plt 104 (noted plt were 97 ~2 mos ago) Home dose: Coumadin 5mg  daily except for 7.5mg  on Wed. Last dose 10/11.  ID: [id number]. CXR with patchy bilateral airspace disease worrisome for pneumonia including atypical/viral infection. CRP 12.7. WBC wnl. LA 2. PCT 1.42.  10/12 Remdesivir>>10/16  10/12 BCx: MRSA PCR:   ALT wnl. Pt requiring 15L NRB.  Goal of Therapy:  INR 2-3 Monitor platelets by anticoagulation protocol: Yes   Plan:  No coumadin tonight Daily INR Remdesivir 200mg  IV now then 100mg  IV daily x 4 days Will f/u ALT  Sherlon Handing, PharmD, BCPS [facility] Clinical pharmacist phone [phone] 04/09/2019,11:52 PM

## 2019-04-09 NOTE — ED Notes (Signed)
Pt reports he has a DNR but does not believe that this system has a copy of same
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
## 2019-04-09 NOTE — ED Notes (Signed)
Spoke with [place], who reports receiving nurse is on break, this nurse gave number to have nurse Samantha call me back if she has any questions regarding SBAR handoff.

## 2019-04-09 NOTE — ED Notes (Addendum)
Spoke to [place]  With bed placement to inform that pt has been on non-rebreather about 45 minutes and his O2 sats are at 91% Patty reports she will inform [facility] and [facility].

## 2019-04-09 NOTE — ED Notes (Signed)
Pt continues to request info regarding his O2 sat  He tells respiratory he does not wish intubation

## 2019-04-09 NOTE — ED Notes (Signed)
Dr. Z at bedside.

## 2019-04-09 NOTE — ED Notes (Signed)
Pt moved to room 2 due to increasing distress  2nd IV placed

## 2019-04-09 NOTE — ED Notes (Signed)
Dr Z in to assess 

## 2019-04-09 NOTE — ED Notes (Signed)
Call to lab re: need blood cultures

## 2019-04-09 NOTE — ED Notes (Signed)
Call from lab  Positive covid

## 2019-04-09 NOTE — ED Notes (Signed)
Joelene Millin, RT at bedside

## 2019-04-10 ENCOUNTER — Other Ambulatory Visit: Payer: Self-pay | Admitting: Family

## 2019-04-10 ENCOUNTER — Other Ambulatory Visit: Payer: Self-pay

## 2019-04-10 DIAGNOSIS — U071 COVID-19: Secondary | ICD-10-CM | POA: Diagnosis not present

## 2019-04-10 DIAGNOSIS — J1289 Other viral pneumonia: Secondary | ICD-10-CM | POA: Diagnosis not present

## 2019-04-10 LAB — GLUCOSE, CAPILLARY
Glucose-Capillary: 230 mg/dL — ABNORMAL HIGH (ref 70–99)
Glucose-Capillary: 258 mg/dL — ABNORMAL HIGH (ref 70–99)
Glucose-Capillary: 262 mg/dL — ABNORMAL HIGH (ref 70–99)
Glucose-Capillary: 267 mg/dL — ABNORMAL HIGH (ref 70–99)
Glucose-Capillary: 285 mg/dL — ABNORMAL HIGH (ref 70–99)
Glucose-Capillary: 304 mg/dL — ABNORMAL HIGH (ref 70–99)
Glucose-Capillary: 316 mg/dL — ABNORMAL HIGH (ref 70–99)

## 2019-04-10 LAB — CBC WITH DIFFERENTIAL/PLATELET
Abs Immature Granulocytes: 0.05 10*3/uL (ref 0.00–0.07)
Basophils Absolute: 0 10*3/uL (ref 0.0–0.1)
Basophils Relative: 0 %
Eosinophils Absolute: 0 10*3/uL (ref 0.0–0.5)
Eosinophils Relative: 0 %
HCT: 32.4 % — ABNORMAL LOW (ref 39.0–52.0)
Hemoglobin: 10.4 g/dL — ABNORMAL LOW (ref 13.0–17.0)
Immature Granulocytes: 2 %
Lymphocytes Relative: 18 %
Lymphs Abs: 0.6 10*3/uL — ABNORMAL LOW (ref 0.7–4.0)
MCH: 30 pg (ref 26.0–34.0)
MCHC: 32.1 g/dL (ref 30.0–36.0)
MCV: 93.4 fL (ref 80.0–100.0)
Monocytes Absolute: 0.2 10*3/uL (ref 0.1–1.0)
Monocytes Relative: 7 %
Neutro Abs: 2.4 10*3/uL (ref 1.7–7.7)
Neutrophils Relative %: 73 %
Platelets: 91 10*3/uL — ABNORMAL LOW (ref 150–400)
RBC: 3.47 MIL/uL — ABNORMAL LOW (ref 4.22–5.81)
RDW: 13.6 % (ref 11.5–15.5)
WBC: 3.3 10*3/uL — ABNORMAL LOW (ref 4.0–10.5)
nRBC: 0.6 % — ABNORMAL HIGH (ref 0.0–0.2)

## 2019-04-10 LAB — ABO/RH: ABO/RH(D): A POS

## 2019-04-10 LAB — COMPREHENSIVE METABOLIC PANEL
ALT: 31 U/L (ref 0–44)
AST: 108 U/L — ABNORMAL HIGH (ref 15–41)
Albumin: 2.2 g/dL — ABNORMAL LOW (ref 3.5–5.0)
Alkaline Phosphatase: 75 U/L (ref 38–126)
Anion gap: 13 (ref 5–15)
BUN: 78 mg/dL — ABNORMAL HIGH (ref 6–20)
CO2: 26 mmol/L (ref 22–32)
Calcium: 7.9 mg/dL — ABNORMAL LOW (ref 8.9–10.3)
Chloride: 95 mmol/L — ABNORMAL LOW (ref 98–111)
Creatinine, Ser: 5.13 mg/dL — ABNORMAL HIGH (ref 0.61–1.24)
GFR calc Af Amer: 14 mL/min — ABNORMAL LOW (ref >60)
GFR calc non Af Amer: 12 mL/min — ABNORMAL LOW (ref >60)
Glucose, Bld: 257 mg/dL — ABNORMAL HIGH (ref 70–99)
Potassium: 3.6 mmol/L (ref 3.5–5.1)
Sodium: 134 mmol/L — ABNORMAL LOW (ref 135–145)
Total Bilirubin: 0.8 mg/dL (ref 0.3–1.2)
Total Protein: 6.4 g/dL — ABNORMAL LOW (ref 6.5–8.1)

## 2019-04-10 LAB — HIV ANTIBODY (ROUTINE TESTING W REFLEX): HIV Screen 4th Generation wRfx: NONREACTIVE

## 2019-04-10 LAB — MRSA PCR SCREENING: MRSA by PCR: NEGATIVE

## 2019-04-10 LAB — HEMOGLOBIN A1C
Hgb A1c MFr Bld: 7.9 % — ABNORMAL HIGH (ref 4.8–5.6)
Mean Plasma Glucose: 180.03 mg/dL

## 2019-04-10 LAB — FERRITIN: Ferritin: 157 ng/mL (ref 24–336)

## 2019-04-10 LAB — PROTIME-INR
INR: 3.5 — ABNORMAL HIGH (ref 0.8–1.2)
Prothrombin Time: 34.2 seconds — ABNORMAL HIGH (ref 11.4–15.2)

## 2019-04-10 LAB — D-DIMER, QUANTITATIVE: D-Dimer, Quant: 0.72 ug/mL-FEU — ABNORMAL HIGH (ref 0.00–0.50)

## 2019-04-10 LAB — C-REACTIVE PROTEIN: CRP: 13.7 mg/dL — ABNORMAL HIGH (ref <1.0)

## 2019-04-10 MED ORDER — SODIUM CHLORIDE 0.9 % IV SOLN
INTRAVENOUS | Status: DC | PRN
  Administered 2019-04-10 – 2019-04-11 (×2): 250 mL via INTRAVENOUS

## 2019-04-10 MED ORDER — SODIUM CHLORIDE 0.9 % IV SOLN
100.0000 mg | INTRAVENOUS | Status: AC
  Administered 2019-04-11 – 2019-04-14 (×4): 100 mg via INTRAVENOUS
  Filled 2019-04-10 (×4): qty 20

## 2019-04-10 MED ORDER — WARFARIN - PHARMACIST DOSING INPATIENT
Freq: Every day | Status: DC
  Administered 2019-04-12 – 2019-04-25 (×5)

## 2019-04-10 MED ORDER — FENTANYL 75 MCG/HR TD PT72
1.0000 | MEDICATED_PATCH | TRANSDERMAL | Status: DC
  Administered 2019-04-10 – 2019-04-22 (×5): 1 via TRANSDERMAL
  Filled 2019-04-10 (×5): qty 1

## 2019-04-10 MED ORDER — SODIUM CHLORIDE 0.9 % IV SOLN
200.0000 mg | Freq: Once | INTRAVENOUS | Status: AC
  Administered 2019-04-10: 200 mg via INTRAVENOUS
  Filled 2019-04-10: qty 40

## 2019-04-10 NOTE — Progress Notes (Signed)
Patient has light blue t-shirt , light blue shorts, a cell phone charge and a cell phone at bedside. Patient states these were all the belongings he brought with him.

## 2019-04-10 NOTE — Progress Notes (Signed)
Inpatient Diabetes Program Recommendations  AACE/ADA: New Consensus Statement on Inpatient Glycemic Control (2015)  Target Ranges:  Prepandial:   less than 140 mg/dL      Peak postprandial:   less than 180 mg/dL (1-2 hours)      Critically ill patients:  140 - 180 mg/dL   Lab Results  Component Value Date   GLUCAP 285 (H) 04/10/2019   HGBA1C 7.9 (H) 04/10/2019    Review of Glycemic Control  Diabetes history: DM2 Outpatient Diabetes medications: Toujeo 400 units am + 200 units pm + Humalog 50 units tid + Jardiance 25 mg qd + Trulicity 1.5 mg q week. Current orders for Inpatient glycemic control: Lantus 100 units + Novolog resistant correction q 4 hrs. + Decadron 6 mg qd  Inpatient Diabetes Program Recommendations:   -Add Novolog 10 units tid meal coverage if eats 50%  Thank you, Bethena Roys E. Mischa Pollard, RN, MSN, CDE  Diabetes Coordinator Inpatient Glycemic Control Team Team Pager [phone] (8am-5pm) 04/10/2019 1:16 PM

## 2019-04-10 NOTE — Progress Notes (Signed)
[name] fentanyl patch on right upper arm from PTA removed and wasted in sharps, witnessed by Sabra Heck, RN. New patch applied to left upper arm per order

## 2019-04-10 NOTE — Plan of Care (Signed)
Pt agrees with and understands current POC.

## 2019-04-10 NOTE — Progress Notes (Signed)
Initial Nutrition Assessment  DOCUMENTATION CODES:   Morbid obesity  INTERVENTION:   Pt receiving Hormel Shake daily with Breakfast which provides 520 kcals and 22 g of protein and Magic cup BID with lunch and dinner, each supplement provides 290 kcal and 9 grams of protein, automatically on meal trays to optimize nutritional intake.   Encourage PO intake   NUTRITION DIAGNOSIS:   Increased nutrient needs related to (COVID-19 PNA) as evidenced by estimated needs.  GOAL:   Patient will meet greater than or equal to 90% of their needs  MONITOR:   PO intake  REASON FOR ASSESSMENT:   Malnutrition Screening Tool    ASSESSMENT:   Pt with PMH of morbid obesity, sz, DM, depression, BPH, OSA, OHS on chronic 2L O2, PE, and DVT now admitted with COVID-19 PNA.    Pt on 15 L O2 via HFNC.  Meal Completion: 90% at Breakfast  Medications reviewed and include: decadron, 80 mg lasix BID, 100 units lantus daily, 40 mEq KCl daily Remdesivir   Labs reviewed: BUN/Cr: 78/5.13 CBG's: 258-230-285    NUTRITION - FOCUSED PHYSICAL EXAM:  Deferred; RD working remotely   Diet Order:   Diet Order            Diet heart healthy/carb modified Room service appropriate? Yes; Fluid consistency: Thin  Diet effective now              EDUCATION NEEDS:   No education needs have been identified at this time  Skin:  Skin Assessment: Skin Integrity Issues: Skin Integrity Issues:: Unstageable Unstageable: coccyx  Last BM:  10/11  Height:   Ht Readings from Last 1 Encounters:  04/10/19 6\' 3"  (1.905 m)    Weight:   Wt Readings from Last 1 Encounters:  04/10/19 (!) 209.6 kg    Ideal Body Weight:  89 kg  BMI:  Body mass index is 57.75 kg/m.  Estimated Nutritional Needs:   Kcal:  1900-2200  Protein:  125-140 grams  Fluid:  2 L/day  Maylon Peppers RD, LDN, CNSC [phone] Pager [phone] After Hours Pager

## 2019-04-10 NOTE — Progress Notes (Signed)
PROGRESS NOTE    DONTAVION [name]  [id number] DOB: 18-Oct-1965 DOA: 04/09/2019 PCP: Sharion Balloon, FNP   Brief Narrative:   [name] is a 53 y.o. male with medical history significant for morbid obesity, seizures, diabetes mellitus, depression, BPH, OSA, OHS on chronic 2 L O2, history of PE and DVT.  Patient presented to the ED with complaints of difficulty breathing.  Of 2 days duration.,  Patient's wife tested positive for COVID this past week.  Spouse is a Therapist, music at [employer]. In ED patient was noted to be afebrile 98.5.  O2 sats initially 86% on nasal cannula, patient was subsequently placed on nonrebreather mask with O2 sats increasing to 87%, he was subsequently switched to BiPAP, with O2 sats maintaining greater than 96%.  Portable chest x-ray showed patchy bilateral airspace disease worrisome for pneumonia, including atypical/viral infection.  Lactic acid 2.9  >> 2, without fluids.  Patient has a DNR form at home, initially refused intubation but later he requested that he not be intubated unless he "crashes and burns".  Patient spouse was called on the phone, she also confirmed patient was DNR.  EDP tells me he talked to patient in detail about this-he agrees to BiPAP, but he has been told that he cannot be transported with BiPAP.  He wants to be transported with nonrebreather or nasal cannula.  Hospitalist to admit for COVID 19 pneumonia.   Assessment & Plan: Active Problems:   Pneumonia due to COVID-19 virus  Acute hypoxic respiratory failure in the setting of COVID19 pneumonia, POA -Continue steroids, Remdesmivir - started 04/09/19 -Hold actemra - follow procalcitonin to rule out concurrent bacterial infection -Daily CBC, CMP, CRP, d-dimer, ferritin -Continue incentive, proning, and flutter as tolerated; continue supportive care Recent Labs    04/09/19 1331 04/10/19 0540  DDIMER 0.80* 0.72*  FERRITIN 424* 157  LDH 411*  --   CRP 12.7* 13.7*     Pulmonary embolism, DVT-on anticoagulation with warfarin. -INR 3.8 - follow with am labs; monitor for signs of bleeding -Warfarin per pharmacy -Questionably on Lasix as below the setting of DVT/PE  Lactic acidosis, resolving - Downtrend appropriately - Will hold off on fluids given tolerating PO - Continue lasix  Seizure history, stable -Resume home Keppra, Lamictal -Seizure precautions  Diabetes mellitus, markedly uncontrolled - Home meds include Toujeo, jardiance, emgality, trulicity - [id number] -Monitor glucose while on steroids, SSI -Continue long-acting insulin at 100 units daily, Invokana -Diabetic diet  Morbid obesity, OSA, OHS, chronic respiratory failure on chronic 2 L  - Currently on Lasix 80 mg twice daily without clear etiology (?HF - most recent echo 2015 - report unavailable) - Patient declines HF diagnosis - Lengthy discussion on diet/exercise regimen  Depression bipolar disorder - Resume home buspirone, Cymbalta, Lyrica  DVT prophylaxis: Warfarin Code Status: Full Family Communication: None at bedside Disposition Plan: Pending clinical status Consults called: none Admission status: inpt, tele - continues to require oxygen supplementation well above baseline.  IV steroids, IV Remdesivir, mains critically ill in the ICU on high flow oxygen at this time.  Subjective: No acute issues or events overnight, respiratory status improving, denies chest pain, nausea, vomiting, diarrhea, speech, chills.  Objective: Vitals:   04/10/19 0530 04/10/19 0630 04/10/19 0700 04/10/19 0746  BP: (!) 121/55 122/61 107/63   Pulse: 84 84 85   Resp: 16 (!) 23 17   Temp:    97.7 F (36.5 C)  TempSrc:    Oral  SpO2: 92%  93% 91%   Weight:      Height:        Intake/Output Summary (Last 24 hours) at 04/10/2019 0801 Last data filed at 04/10/2019 0600 Gross per 24 hour  Intake 253.87 ml  Output 650 ml  Net -396.13 ml   Filed Weights   04/09/19 1215 04/10/19  0045  Weight: (!) 192.8 kg (!) 209.6 kg    Examination:  General: Obese gentleman pleasantly resting in bed, No acute distress. HEENT:  Normocephalic atraumatic.  Sclerae nonicteric, noninjected.  Extraocular movements intact bilaterally. Neck:  Without mass or deformity.  Trachea is midline. Lungs:  Clear to auscultate bilaterally without rhonchi, wheeze, or rales. Heart:  Regular rate and rhythm.  Without murmurs, rubs, or gallops. Abdomen:  Soft, nontender, nondistended.  Without guarding or rebound. Extremities: Without cyanosis, clubbing, edema, or obvious deformity. Vascular:  Dorsalis pedis and posterior tibial pulses palpable bilaterally. Skin:  Warm and dry, no erythema, no ulcerations.  Data Reviewed: I have personally reviewed following labs and imaging studies  CBC: Recent Labs  Lab 04/09/19 1331 04/10/19 0540  WBC 5.9 3.3*  NEUTROABS 4.4 2.4  HGB 11.2* 10.4*  HCT 34.8* 32.4*  MCV 94.3 93.4  PLT 104* 91*   Basic Metabolic Panel: Recent Labs  Lab 04/09/19 1331 04/10/19 0540  NA 133* 134*  K 3.4* 3.6  CL 95* 95*  CO2 24 26  GLUCOSE 212* 257*  BUN 72* 78*  CREATININE 4.84* 5.13*  CALCIUM 7.6* 7.9*  MG 1.8  --    GFR: Estimated Creatinine Clearance: 32 mL/min (A) (by C-G formula based on SCr of 5.13 mg/dL (H)). Liver Function Tests: Recent Labs  Lab 04/09/19 1331 04/10/19 0540  AST 82* 108*  ALT 28 31  ALKPHOS 74 75  BILITOT 0.6 0.8  PROT 6.7 6.4*  ALBUMIN 2.3* 2.2*   Coagulation Profile: Recent Labs  Lab 04/09/19 1331  INR 3.8*   CBG: Recent Labs  Lab 04/10/19 0041 04/10/19 0354 04/10/19 0744  GLUCAP 267* 258* 230*   Lipid Profile: Recent Labs    04/09/19 1331  TRIG 164*   Anemia Panel: Recent Labs    04/09/19 1331  FERRITIN 424*   Sepsis Labs: Recent Labs  Lab 04/09/19 1331 04/09/19 1547  PROCALCITON 1.42  --   LATICACIDVEN 2.9* 2.0*    Recent Results (from the past 240 hour(s))  SARS Coronavirus 2 by RT PCR  (hospital order, performed in [place] hospital lab) Nasopharyngeal Nasopharyngeal Swab     Status: Abnormal   Collection Time: 04/09/19 12:47 PM   Specimen: Nasopharyngeal Swab  Result Value Ref Range Status   SARS Coronavirus 2 POSITIVE (A) NEGATIVE Final    Comment: RESULT CALLED TO, READ BACK BY AND VERIFIED WITH:  A. TUTTLE,RN  @1341   10/11/2020KAY (NOTE) If result is NEGATIVE SARS-CoV-2 target nucleic acids are NOT DETECTED. The SARS-CoV-2 RNA is generally detectable in upper and lower  respiratory specimens during the acute phase of infection. The lowest  concentration of SARS-CoV-2 viral copies this assay can detect is 250  copies / mL. A negative result does not preclude SARS-CoV-2 infection  and should not be used as the sole basis for treatment or other  patient management decisions.  A negative result may occur with  improper specimen collection / handling, submission of specimen other  than nasopharyngeal swab, presence of viral mutation(s) within the  areas targeted by this assay, and inadequate number of viral copies  (<250 copies / mL). A negative  result must be combined with clinical  observations, patient history, and epidemiological information. If result is POSITIVE SARS-CoV-2 target nucleic acids are DETECTED. T he SARS-CoV-2 RNA is generally detectable in upper and lower  respiratory specimens during the acute phase of infection.  Positive  results are indicative of active infection with SARS-CoV-2.  Clinical  correlation with patient history and other diagnostic information is  necessary to determine patient infection status.  Positive results do  not rule out bacterial infection or co-infection with other viruses. If result is PRESUMPTIVE POSTIVE SARS-CoV-2 nucleic acids MAY BE PRESENT.   A presumptive positive result was obtained on the submitted specimen  and confirmed on repeat testing.  While 2019 novel coronavirus  (SARS-CoV-2) nucleic acids may be  present in the submitted sample  additional confirmatory testing may be necessary for epidemiological  and / or clinical management purposes  to differentiate between  SARS-CoV-2 and other Sarbecovirus currently known to infect humans.  If clinically indicated additional testing with an alternate test  methodology [phone]) is  advised. The SARS-CoV-2 RNA is generally  detectable in upper and lower respiratory specimens during the acute  phase of infection. The expected result is Negative. Fact Sheet for Patients:  StrictlyIdeas.no Fact Sheet for Healthcare Providers: BankingDealers.co.za This test is not yet approved or cleared by the [place] FDA and has been authorized for detection and/or diagnosis of SARS-CoV-2 by FDA under an Emergency Use Authorization (EUA).  This EUA will remain in effect (meaning this test can be used) for the duration of the COVID-19 declaration under Section 564(b)(1) of the Act, 21 U.S.C. section 360bbb-3(b)(1), unless the authorization is terminated or revoked sooner. Performed at [facility], [address] [place], [place] [address]   Blood Culture (routine x 2)     Status: None (Preliminary result)   Collection Time: 04/09/19  2:25 PM   Specimen: Left Antecubital; Blood  Result Value Ref Range Status   Specimen Description   Final    LEFT ANTECUBITAL BOTTLES DRAWN AEROBIC AND ANAEROBIC   Special Requests Blood Culture adequate volume  Final   Culture   Final    NO GROWTH < 24 HOURS Performed at [facility], [address], [place], [place] [address]    Report Status PENDING  Incomplete  Blood Culture (routine x 2)     Status: None (Preliminary result)   Collection Time: 04/09/19  2:26 PM   Specimen: BLOOD LEFT HAND  Result Value Ref Range Status   Specimen Description   Final    BLOOD LEFT HAND BOTTLES DRAWN AEROBIC AND ANAEROBIC   Special Requests Blood Culture adequate volume  Final    Culture   Final    NO GROWTH < 24 HOURS Performed at [facility], [address] [place], [place] [address]    Report Status PENDING  Incomplete    Radiology Studies: Dg Chest Port 1 View  Result Date: 04/09/2019 CLINICAL DATA:  Onset respiratory distress today. COVID-19 exposure. EXAM: PORTABLE CHEST 1 VIEW COMPARISON:  PA and lateral chest 01/26/2019.  CT chest 12/28/2018. FINDINGS: Lung volumes are low. There is patchy bilateral airspace disease. Heart size is normal. No pneumothorax or pleural effusion. IMPRESSION: Patchy bilateral airspace disease worrisome for pneumonia including atypical/viral infection. Electronically Signed   By: Inge Rise M.D.   On: 04/09/2019 13:51        Scheduled Meds: . busPIRone  15 mg Oral BID  . Chlorhexidine Gluconate Cloth  6 each Topical Daily  .  dexamethasone (DECADRON) injection  6 mg Intravenous Q24H  . dextromethorphan-guaiFENesin  1 tablet Oral BID  . DULoxetine  60 mg Oral Daily  . furosemide  80 mg Oral BID  . insulin aspart  0-20 Units Subcutaneous Q4H  . insulin glargine  100 Units Subcutaneous Daily  . lamoTRIgine  200 mg Oral BID  . levETIRAcetam  1,000 mg Oral BID  . loratadine  10 mg Oral Daily  . pantoprazole  40 mg Oral Daily  . potassium chloride  40 mEq Oral Daily  . pregabalin  50 mg Oral TID  . simvastatin  20 mg Oral QHS  . tamsulosin  0.4 mg Oral QPC supper  . Warfarin - Pharmacist Dosing Inpatient   Does not apply q1800   Continuous Infusions: . sodium chloride 10 mL/hr at 04/10/19 0200  . [START ON 04/11/2019] remdesivir 100 mg in NS 250 mL       LOS: 1 day   Time spent: 35 min  Little Ishikawa, DO [facility]  If 7PM-7AM, please contact night-coverage [email or web address] Password [email or web address] 04/10/2019, 8:01 AM

## 2019-04-10 NOTE — Progress Notes (Signed)
Start Progress Note Patient Name: [name] DOB: 01/14/1966 MRN: [id number]   Date of Service  04/10/2019  HPI/Events of Note  80 M obese (BMI 61), DM, OSA/OHS on chronic O2 presented with shortness of breath. Wife, who is a rad tech at [employer], is positive for COVID and he is now confirmed COVID.  eICU Interventions   Started on Remdesevir and dexamethasone  On warfarin given history of VTE  O2 support     Intervention Category Major Interventions: Respiratory failure - evaluation and management Evaluation Type: New Patient Evaluation  Judd Lien 04/10/2019, 12:29 AM

## 2019-04-10 NOTE — TOC Initial Note (Signed)
Transition of Care [facility]) - Initial/Assessment Note    Patient Details  Name: [name] MRN: [id number] Date of Birth: 1966-04-10  [facility] CM/SW Contact:    Carles Collet, RN Phone Number: 04/10/2019, 2:59 PM  Clinical Narrative:           [name] w patient's wife [name]. She is + as well and states that she is still experiencing fevers at night and a cough. She informs that patient has oxygen through [employer], an electric WC, BSC, urinal. She describes him as WC bound at home stating that he doesn't usually do more than transfer from chair w RW but could only take a few steps.  She would like HH PT and we discussed a RN as well due to his respiratory compromise, immobility, and history of blood clots.  She was provided with number for nurse's station to call for updates. No other CM needs at this time            Expected Discharge Plan: [place] Barriers to Discharge: Continued Medical Work up   Patient Goals and CMS Choice        Expected Discharge Plan and Services Expected Discharge Plan: [place]   Discharge Planning Services: CM Consult Post Acute Care Choice: Home Health                                        Prior Living Arrangements/Services   Lives with:: Spouse              Current home services: DME    Activities of Daily Living Home Assistive Devices/Equipment: Oxygen, Wheelchair ADL Screening (condition at time of admission) Patient's cognitive ability adequate to safely complete daily activities?: Yes Is the patient deaf or have difficulty hearing?: No Does the patient have difficulty seeing, even when wearing glasses/contacts?: No Does the patient have difficulty concentrating, remembering, or making decisions?: Yes Patient able to express need for assistance with ADLs?: Yes Does the patient have difficulty dressing or bathing?: Yes Independently performs ADLs?: Yes (appropriate  for developmental age) Does the patient have difficulty walking or climbing stairs?: Yes Weakness of Legs: Both Weakness of Arms/Hands: None  Permission Sought/Granted                  Emotional Assessment              Admission diagnosis:  Respiratory distress [R06.03] Hypoxia [R09.02] COVID-19 virus infection [U07.1] Pneumonia due to COVID-19 virus [U07.1, J12.89] Patient Active Problem List   Diagnosis Date Noted  . Pneumonia due to COVID-19 virus 04/09/2019  . Tremor 02/22/2019  . Chronic back pain 11/10/2018  . Depression 05/24/2017  . Chronic migraine without aura, intractable, with status migrainosus 04/02/2017  . Pain medication agreement signed 08/30/2015  . Opioid dependence ([place]) 08/02/2015  . Hyperlipidemia associated with type 2 diabetes mellitus ([place]) 05/31/2015  . Other vascular headache 05/03/2015  . Vitamin D deficiency 11/23/2014  . Testosterone deficiency 11/23/2014  . Intractable chronic migraine without aura 10/02/2014  . GAD (generalized anxiety disorder) 01/24/2014  . GERD (gastroesophageal reflux disease) 01/24/2014  . BPH (benign prostatic hyperplasia) 01/24/2014  . Postoperative pulmonary edema ([place]) 09/15/2012  . History of DVT (deep vein thrombosis) 09/15/2012  . Anemia 05/11/2012  . Thrombocytopenia ([place]) 05/11/2012  . Acute renal failure ([place]) 05/11/2012  . Chronic pain  syndrome 05/11/2012  . Morbid obesity ([place]) 05/10/2012  . OSA (obstructive sleep apnea) 05/10/2012  . Obesity hypoventilation syndrome ([place]) 05/10/2012  . DM2 (diabetes mellitus, type 2) ([place]) 05/10/2012  . History of pulmonary embolism 05/10/2012  . Chronic anticoagulation 05/10/2012  . Seizure disorder ([place]) 05/10/2012  . DYSPNEA 02/05/2009   PCP:  Sharion Balloon, FNP Pharmacy:   [id number] - [place], [place] [address] Phone: [phone] Fax: [phone]     Social Determinants of Health  (SDOH) Interventions    Readmission Risk Interventions No flowsheet data found.

## 2019-04-10 NOTE — Progress Notes (Signed)
ANTICOAGULATION CONSULT NOTE - Follow Up Consult  Pharmacy Consult for warfarin Indication: history of pulmonary embolus and DVT  Allergies  Allergen Reactions  . Bee Venom Anaphylaxis, Shortness Of Breath and Swelling  . Other Shortness Of Breath    Itching, rash with IVP DYE, iodine, shellfish LATEX  . Penicillins Anaphylaxis and Shortness Of Breath    Has patient had a PCN reaction causing immediate rash, facial/tongue/throat swelling, SOB or lightheadedness with hypotension: Yes Has patient had a PCN reaction causing severe rash involving mucus membranes or skin necrosis: No Has patient had a PCN reaction that required hospitalization No Has patient had a PCN reaction occurring within the last 10 years: No If all of the above answers are "NO", then may proceed with Cephalosporin use.   . Shellfish Allergy Nausea And Vomiting and Other (See Comments)    Feels like insides are twisting  . Iodinated Diagnostic Agents     Other reaction(s): RASH  . Iohexol      Code: RASH, Desc: PT WAS ON PREDNISONE FOR GOUT TX. @ TIME OF SCAN AND RECEIVED 50 MG OF BENADRYL IV-ARS 10/08/07   . Iodine Rash  . Latex Rash    Patient Measurements: Height: 6\' 3"  (190.5 cm) Weight: (!) 462 lb (209.6 kg) IBW/kg (Calculated) : 84.5   Vital Signs: Temp: 98.7 F (37.1 C) (10/12 0800) Temp Source: Axillary (10/12 0800) BP: 134/56 (10/12 1000) Pulse Rate: 83 (10/12 1000)  Labs: Recent Labs    04/09/19 1331 04/10/19 0540  HGB 11.2* 10.4*  HCT 34.8* 32.4*  PLT 104* 91*  LABPROT 36.5* 34.2*  INR 3.8* 3.5*  CREATININE 4.84* 5.13*    Estimated Creatinine Clearance: 32 mL/min (A) (by C-G formula based on SCr of 5.13 mg/dL (H)).   Medical History: Past Medical History:  Diagnosis Date  . Anemia   . Anxiety   . Arthritis   . Bipolar 1 disorder ([place])   . Bronchitis    hx of  . Bruises easily   . Chronic kidney disease    decreased left kidney fx  . Chronic pain syndrome 05/11/2012  .  Chronic respiratory failure with hypoxia (HCC)    And with hypercapnia  . Diabetes mellitus without complication ([place])   . Diabetic neuropathy ([place])   . DVT (deep venous thrombosis) (HCC)    LLE DVT ~ '12  . Dyspnea    with ambulation  . GERD (gastroesophageal reflux disease)   . HOH (hard of hearing) 2015   has hearing aids  . Mental disorder   . Migraine   . Neuromuscular disorder ([place])   . Obesity hypoventilation syndrome ([place])   . Obstructive sleep apnea    CPAP  . PE (pulmonary embolism)    bilateral PE ~ '11  . Seizures ([place])   . Thrombocytopenia ([place]) 05/11/2012    Medications:  Scheduled:  . busPIRone  15 mg Oral BID  . Chlorhexidine Gluconate Cloth  6 each Topical Daily  . dexamethasone (DECADRON) injection  6 mg Intravenous Q24H  . dextromethorphan-guaiFENesin  1 tablet Oral BID  . DULoxetine  60 mg Oral Daily  . furosemide  80 mg Oral BID  . insulin aspart  0-20 Units Subcutaneous Q4H  . insulin glargine  100 Units Subcutaneous Daily  . lamoTRIgine  200 mg Oral BID  . levETIRAcetam  1,000 mg Oral BID  . loratadine  10 mg Oral Daily  . pantoprazole  40 mg Oral Daily  . potassium chloride  40 mEq  Oral Daily  . pregabalin  50 mg Oral TID  . simvastatin  20 mg Oral QHS  . tamsulosin  0.4 mg Oral QPC supper  . Warfarin - Pharmacist Dosing Inpatient   Does not apply q1800    Assessment: 53 yo male admitted on 04/09/2019 for SOB and found to be positive for COVID-19. Pharmacy is consulted to dose warfarin for history of DVT and PE. Patient is on warfarin prior to admission. On admission, INR was supratherapeutic at 3.8 and remained supratherapeutic this morning at 3.5. Last reported dose of warfarin on 04/08/2019. Hgb slightly down from 11.2 to 10.4. Platelets decreased from 104 to 91 (baseline ~100-120k). No reported bleeding. Given supratherapeutic INR will hold warfarin dose for tonight and assess INR tomorrow.   Prior to Admission Warfarin Regimen: warfarin 5mg   daily  Goal of Therapy:  INR 2-3 Monitor platelets by anticoagulation protocol: Yes   Plan:  Hold warfarin tonight Monitor daily INR, CBC, and S/S of bleeding   Cristela Felt, PharmD PGY1 Pharmacy Resident [employer]: [phone]  04/10/2019,11:54 AM

## 2019-04-11 ENCOUNTER — Other Ambulatory Visit: Payer: Self-pay | Admitting: Family

## 2019-04-11 DIAGNOSIS — J1289 Other viral pneumonia: Secondary | ICD-10-CM | POA: Diagnosis not present

## 2019-04-11 DIAGNOSIS — L8990 Pressure ulcer of unspecified site, unspecified stage: Secondary | ICD-10-CM | POA: Insufficient documentation

## 2019-04-11 DIAGNOSIS — U071 COVID-19: Secondary | ICD-10-CM | POA: Diagnosis not present

## 2019-04-11 LAB — CBC WITH DIFFERENTIAL/PLATELET
Abs Immature Granulocytes: 0.05 10*3/uL (ref 0.00–0.07)
Basophils Absolute: 0 10*3/uL (ref 0.0–0.1)
Basophils Relative: 0 %
Eosinophils Absolute: 0 10*3/uL (ref 0.0–0.5)
Eosinophils Relative: 0 %
HCT: 34.3 % — ABNORMAL LOW (ref 39.0–52.0)
Hemoglobin: 10.8 g/dL — ABNORMAL LOW (ref 13.0–17.0)
Immature Granulocytes: 2 %
Lymphocytes Relative: 18 %
Lymphs Abs: 0.6 10*3/uL — ABNORMAL LOW (ref 0.7–4.0)
MCH: 29.7 pg (ref 26.0–34.0)
MCHC: 31.5 g/dL (ref 30.0–36.0)
MCV: 94.2 fL (ref 80.0–100.0)
Monocytes Absolute: 0.1 10*3/uL (ref 0.1–1.0)
Monocytes Relative: 4 %
Neutro Abs: 2.5 10*3/uL (ref 1.7–7.7)
Neutrophils Relative %: 76 %
Platelets: 112 10*3/uL — ABNORMAL LOW (ref 150–400)
RBC: 3.64 MIL/uL — ABNORMAL LOW (ref 4.22–5.81)
RDW: 13.6 % (ref 11.5–15.5)
WBC: 3.3 10*3/uL — ABNORMAL LOW (ref 4.0–10.5)
nRBC: 0 % (ref 0.0–0.2)

## 2019-04-11 LAB — GLUCOSE, CAPILLARY
Glucose-Capillary: 249 mg/dL — ABNORMAL HIGH (ref 70–99)
Glucose-Capillary: 247 mg/dL — ABNORMAL HIGH (ref 70–99)
Glucose-Capillary: 281 mg/dL — ABNORMAL HIGH (ref 70–99)
Glucose-Capillary: 398 mg/dL — ABNORMAL HIGH (ref 70–99)
Glucose-Capillary: 385 mg/dL — ABNORMAL HIGH (ref 70–99)

## 2019-04-11 LAB — COMPREHENSIVE METABOLIC PANEL
ALT: 32 U/L (ref 0–44)
AST: 96 U/L — ABNORMAL HIGH (ref 15–41)
Albumin: 2.3 g/dL — ABNORMAL LOW (ref 3.5–5.0)
Alkaline Phosphatase: 73 U/L (ref 38–126)
Anion gap: 14 (ref 5–15)
BUN: 94 mg/dL — ABNORMAL HIGH (ref 6–20)
CO2: 28 mmol/L (ref 22–32)
Calcium: 8.3 mg/dL — ABNORMAL LOW (ref 8.9–10.3)
Chloride: 93 mmol/L — ABNORMAL LOW (ref 98–111)
Creatinine, Ser: 4.98 mg/dL — ABNORMAL HIGH (ref 0.61–1.24)
GFR calc Af Amer: 14 mL/min — ABNORMAL LOW (ref >60)
GFR calc non Af Amer: 12 mL/min — ABNORMAL LOW (ref >60)
Glucose, Bld: 267 mg/dL — ABNORMAL HIGH (ref 70–99)
Potassium: 3.9 mmol/L (ref 3.5–5.1)
Sodium: 135 mmol/L (ref 135–145)
Total Bilirubin: 1 mg/dL (ref 0.3–1.2)
Total Protein: 6.8 g/dL (ref 6.5–8.1)

## 2019-04-11 LAB — C-REACTIVE PROTEIN: CRP: 10.3 mg/dL — ABNORMAL HIGH (ref <1.0)

## 2019-04-11 LAB — D-DIMER, QUANTITATIVE: D-Dimer, Quant: 0.61 ug/mL-FEU — ABNORMAL HIGH (ref 0.00–0.50)

## 2019-04-11 LAB — PROTIME-INR
INR: 3.6 — ABNORMAL HIGH (ref 0.8–1.2)
Prothrombin Time: 35.3 seconds — ABNORMAL HIGH (ref 11.4–15.2)

## 2019-04-11 LAB — FERRITIN: Ferritin: 273 ng/mL (ref 24–336)

## 2019-04-11 NOTE — Progress Notes (Signed)
Patient speaking with wife on phone, patient states that wife has no questions at this time.

## 2019-04-11 NOTE — Consult Note (Signed)
   [name] CM Inpatient Consult   04/11/2019  LADARIAN TUR May 10, 1966 [id number]  Patient was screened for [place] [facility] services and for extreme high risk score of 35% for unplanned readmission score.. Patient will have the transition of care call conducted by the primary care provider at [place], this is a [facility] Embedded practice.  Reviewed the notes of History and Physical and includes but not limited to:  [name] is a 53 y.o. male with medical history significant for morbid obesity, seizures, diabetes mellitus, depression, BPH, OSA, OHS on chronic 2 L O2, history of PE and DVT.  Patient presented to the ED with complaints of difficulty breathing.  Of 2 days duration.,  Patient's wife tested positive for COVID this past week.  Spouse is a Therapist, music at [employer].  Plan: Will follow progress and notify [place] Management and to make aware of post hospital transition of care, when appropriate..   Please contact for further questions,  [name], RN BSN [facility] Liaison  [phone] business mobile phone Toll free office [phone]  Fax number: [phone] [email or web address]

## 2019-04-11 NOTE — Progress Notes (Addendum)
ANTICOAGULATION CONSULT NOTE - Follow Up Consult  Pharmacy Consult for warfarin Indication: history of pulmonary embolus and DVT  Allergies  Allergen Reactions  . Bee Venom Anaphylaxis, Shortness Of Breath and Swelling  . Other Shortness Of Breath    Itching, rash with IVP DYE, iodine, shellfish LATEX  . Penicillins Anaphylaxis and Shortness Of Breath    Has patient had a PCN reaction causing immediate rash, facial/tongue/throat swelling, SOB or lightheadedness with hypotension: Yes Has patient had a PCN reaction causing severe rash involving mucus membranes or skin necrosis: No Has patient had a PCN reaction that required hospitalization No Has patient had a PCN reaction occurring within the last 10 years: No If all of the above answers are "NO", then may proceed with Cephalosporin use.   . Shellfish Allergy Nausea And Vomiting and Other (See Comments)    Feels like insides are twisting  . Iodinated Diagnostic Agents     Other reaction(s): RASH  . Iohexol      Code: RASH, Desc: PT WAS ON PREDNISONE FOR GOUT TX. @ TIME OF SCAN AND RECEIVED 50 MG OF BENADRYL IV-ARS 10/08/07   . Iodine Rash  . Latex Rash    Patient Measurements: Height: 6\' 3"  (190.5 cm) Weight: (!) 462 lb (209.6 kg) IBW/kg (Calculated) : 84.5   Vital Signs: Temp: 97.8 F (36.6 C) (10/13 0742) Temp Source: Axillary (10/13 0742) BP: 120/55 (10/13 0809) Pulse Rate: 77 (10/13 0809)  Labs: Recent Labs    04/09/19 1331 04/10/19 0540 04/11/19 0542  HGB 11.2* 10.4* 10.8*  HCT 34.8* 32.4* 34.3*  PLT 104* 91* 112*  LABPROT 36.5* 34.2* 35.3*  INR 3.8* 3.5* 3.6*  CREATININE 4.84* 5.13* 4.98*    Estimated Creatinine Clearance: 33 mL/min (A) (by C-G formula based on SCr of 4.98 mg/dL (H)).   Medical History: Past Medical History:  Diagnosis Date  . Anemia   . Anxiety   . Arthritis   . Bipolar 1 disorder ([place])   . Bronchitis    hx of  . Bruises easily   . Chronic kidney disease    decreased left  kidney fx  . Chronic pain syndrome 05/11/2012  . Chronic respiratory failure with hypoxia (HCC)    And with hypercapnia  . Diabetes mellitus without complication ([place])   . Diabetic neuropathy ([place])   . DVT (deep venous thrombosis) (HCC)    LLE DVT ~ '12  . Dyspnea    with ambulation  . GERD (gastroesophageal reflux disease)   . HOH (hard of hearing) 2015   has hearing aids  . Mental disorder   . Migraine   . Neuromuscular disorder ([place])   . Obesity hypoventilation syndrome ([place])   . Obstructive sleep apnea    CPAP  . PE (pulmonary embolism)    bilateral PE ~ '11  . Seizures ([place])   . Thrombocytopenia ([place]) 05/11/2012    Medications:  Scheduled:  . busPIRone  15 mg Oral BID  . Chlorhexidine Gluconate Cloth  6 each Topical Daily  . dexamethasone (DECADRON) injection  6 mg Intravenous Q24H  . dextromethorphan-guaiFENesin  1 tablet Oral BID  . DULoxetine  60 mg Oral Daily  . fentaNYL  1 patch Transdermal Q72H  . furosemide  80 mg Oral BID  . insulin aspart  0-20 Units Subcutaneous Q4H  . insulin glargine  100 Units Subcutaneous Daily  . lamoTRIgine  200 mg Oral BID  . levETIRAcetam  1,000 mg Oral BID  . loratadine  10 mg Oral  Daily  . pantoprazole  40 mg Oral Daily  . potassium chloride  40 mEq Oral Daily  . pregabalin  50 mg Oral TID  . simvastatin  20 mg Oral QHS  . tamsulosin  0.4 mg Oral QPC supper  . Warfarin - Pharmacist Dosing Inpatient   Does not apply q1800    Assessment: 53 yo male admitted on 04/09/2019 for SOB and found to be positive for COVID-19. Pharmacy is consulted to dose warfarin for history of DVT and PE. Patient is on warfarin prior to admission. On admission, INR was supratherapeutic and warfarin has been held for two days (10/11 & 10/12). INR remains supratherapeutic this morning at 3.6 after these held doses. Hgb 10.4 and platelets 112 (baseline ~100-120k). No reported bleeding. Given supratherapeutic INR will hold warfarin dose for tonight and  assess INR tomorrow.   Prior to Admission Warfarin Regimen: warfarin 5mg  daily  Goal of Therapy:  INR 2-3 Monitor platelets by anticoagulation protocol: Yes   Plan:  Hold warfarin tonight Monitor daily INR, CBC, and S/S of bleeding   Cristela Felt, PharmD PGY1 Pharmacy Resident [place]: [phone]  04/11/2019,10:59 AM

## 2019-04-11 NOTE — Progress Notes (Signed)
Inpatient Diabetes Program Recommendations  AACE/ADA: New Consensus Statement on Inpatient Glycemic Control (2015)  Target Ranges:  Prepandial:   less than 140 mg/dL      Peak postprandial:   less than 180 mg/dL (1-2 hours)      Critically ill patients:  140 - 180 mg/dL   Lab Results  Component Value Date   GLUCAP 281 (H) 04/11/2019   HGBA1C 7.9 (H) 04/10/2019    Review of Glycemic Control Results for [name], [name] (MRN [id number]) as of 04/11/2019 13:47  Ref. Range 04/10/2019 20:16 04/10/2019 23:13 04/11/2019 04:23 04/11/2019 07:42 04/11/2019 11:37  Glucose-Capillary Latest Ref Range: 70 - 99 mg/dL 304 (H) 316 (H) 249 (H) 247 (H) 281 (H)   Diabetes history: DM2 Outpatient Diabetes medications: Toujeo 400 units am + 200 units pm + Humalog 50 units tid + Jardiance 25 mg qd + Trulicity 1.5 mg q week. Current orders for Inpatient glycemic control: Lantus 100 units + Novolog resistant correction q 4 hrs. + Decadron 6 mg qd  Inpatient Diabetes Program Recommendations:   -Add Novolog 10 units tid meal coverage if eats 50%   -Increase Lantus to 100 bid  Thank you, Bethena Roys E. Atha Muradyan, RN, MSN, CDE  Diabetes Coordinator Inpatient Glycemic Control Team Team Pager [phone] (8am-5pm) 04/11/2019 1:48 PM

## 2019-04-11 NOTE — Progress Notes (Addendum)
PROGRESS NOTE    [name]  [id number] DOB: 04-18-1966 DOA: 04/09/2019 PCP: Sharion Balloon, FNP   Brief Narrative:   [name] is a 53 y.o. male with medical history significant for morbid obesity, seizures, diabetes mellitus, depression, BPH, OSA, OHS on chronic 2 L O2, history of PE and DVT.  Patient presented to the ED with complaints of difficulty breathing.  Of 2 days duration.,  Patient's wife tested positive for COVID this past week.  Spouse is a Therapist, music at [employer]. In ED patient was noted to be afebrile 98.5.  O2 sats initially 86% on nasal cannula, patient was subsequently placed on nonrebreather mask with O2 sats increasing to 87%, he was subsequently switched to BiPAP, with O2 sats maintaining greater than 96%.  Portable chest x-ray showed patchy bilateral airspace disease worrisome for pneumonia, including atypical/viral infection.  Lactic acid 2.9  >> 2, without fluids.  Patient has a DNR form at home, initially refused intubation but later he requested that he not be intubated unless he "crashes and burns".  Patient spouse was called on the phone, she also confirmed patient was DNR.  EDP tells me he talked to patient in detail about this-he agrees to BiPAP, but he has been told that he cannot be transported with BiPAP.  He wants to be transported with nonrebreather or nasal cannula.  Hospitalist to admit for COVID 19 pneumonia.  Assessment & Plan: Active Problems:   Pneumonia due to COVID-19 virus   Pressure injury of skin   Acute on chronic hypoxic respiratory failure in the setting of COVID19 pneumonia, POA - Baseline 2-3L [place] around the clock at 91-93%; ok with 85-88% sats given his baseline - Continue steroids, Remdesmivir - started 04/09/19 - Hold actemra - procalcitonin minimally elevated - Daily CBC, CMP, CRP, d-dimer, ferritin - Continue incentive, proning, and flutter as tolerated; continue supportive care SpO2: (!) 86 % O2 Flow Rate  (L/min): 15 L/min FiO2 (%): 100 % Recent Labs    04/09/19 1331 04/10/19 0540 04/11/19 0542  DDIMER 0.80* 0.72* 0.61*  FERRITIN 424* 157 273  LDH 411*  --   --   CRP 12.7* 13.7* 10.3*    Pulmonary embolism, DVT-on anticoagulation with warfarin. - INR - follow with am labs; monitor for signs of bleeding -Warfarin per pharmacy -Questionably on Lasix as below the setting of DVT/PE  Lactic acidosis, resolving - Downtrend appropriately - Will hold off on IV fluids given tolerating PO - Continue lasix  Seizure history, stable -Continue home Keppra, Lamictal -Seizure precautions  Diabetes mellitus, markedly uncontrolled - Home meds include Toujeo, jardiance, emgality, trulicity - [id number] - Monitor glucose while on steroids, SSI - Increase insulin per discussion with pharmacy: 100u long acting (may need to increase to bid dosing in the next 24h); Increase sliding scale - Continue Invokana - Diabetic diet  Morbid obesity, OSA, OHS, chronic respiratory failure on chronic 2 L  - Currently on Lasix 80 mg twice daily without clear etiology (?HF - most recent echo 2015 - report unavailable) - Patient declines HF diagnosis - Lengthy discussion on diet/exercise regimen  Depression bipolar disorder - Resume home buspirone, Cymbalta, Lyrica  DVT prophylaxis: Warfarin Code Status: Full Family Communication: None at bedside Disposition Plan: Pending clinical status Consults called: none Admission status: inpt,  continues to require oxygen supplementation well above baseline.  IV steroids, IV Remdesivir, mains critically ill in the ICU on high flow oxygen at this time.  Subjective: No acute  issues or events overnight, respiratory status improving, denies chest pain, nausea, vomiting, diarrhea, speech, chills.  Objective: Vitals:   04/11/19 1200 04/11/19 1300 04/11/19 1400 04/11/19 1500  BP: 138/63 (!) 147/104 (!) 133/40 123/80  Pulse: 79 84 82 84  Resp: 18 17 14 15   Temp:       TempSrc:      SpO2: (!) 86% (!) 85% (!) 85% (!) 86%  Weight:      Height:        Intake/Output Summary (Last 24 hours) at 04/11/2019 1809 Last data filed at 04/11/2019 1600 Gross per 24 hour  Intake 1663.76 ml  Output 2200 ml  Net -536.24 ml   Filed Weights   04/09/19 1215 04/10/19 0045  Weight: (!) 192.8 kg (!) 209.6 kg    Examination:  General: Obese gentleman pleasantly resting in bed, No acute distress. Tolerating high flow [place] HEENT: Normocephalic atraumatic.  Sclerae nonicteric, noninjected.  Extraocular movements intact bilaterally. Neck: Without mass or deformity. Trachea is midline. Lungs: Clear to auscultate bilaterally without rhonchi, wheeze, or rales. Heart: Regular rate and rhythm.  Without murmurs, rubs, or gallops. Abdomen: Soft, nontender, nondistended.  Without guarding or rebound. Extremities: Without cyanosis, clubbing, edema, or obvious deformity. Vascular: Dorsalis pedis and posterior tibial pulses palpable bilaterally. Skin: Warm and dry, no erythema, no ulcerations.  Data Reviewed: I have personally reviewed following labs and imaging studies  CBC: Recent Labs  Lab 04/09/19 1331 04/10/19 0540 04/11/19 0542  WBC 5.9 3.3* 3.3*  NEUTROABS 4.4 2.4 2.5  HGB 11.2* 10.4* 10.8*  HCT 34.8* 32.4* 34.3*  MCV 94.3 93.4 94.2  PLT 104* 91* [id number]*   Basic Metabolic Panel: Recent Labs  Lab 04/09/19 1331 04/10/19 0540 04/11/19 0542  NA 133* 134* 135  K 3.4* 3.6 3.9  CL 95* 95* 93*  CO2 24 26 28   GLUCOSE 212* 257* 267*  BUN 72* 78* 94*  CREATININE 4.84* 5.13* 4.98*  CALCIUM 7.6* 7.9* 8.3*  MG 1.8  --   --    GFR: Estimated Creatinine Clearance: 33 mL/min (A) (by C-G formula based on SCr of 4.98 mg/dL (H)). Liver Function Tests: Recent Labs  Lab 04/09/19 1331 04/10/19 0540 04/11/19 0542  AST 82* 108* 96*  ALT 28 31 32  ALKPHOS 74 75 73  BILITOT 0.6 0.8 1.0  PROT 6.7 6.4* 6.8  ALBUMIN 2.3* 2.2* 2.3*   Coagulation Profile: Recent Labs   Lab 04/09/19 1331 04/10/19 0540 04/11/19 0542  INR 3.8* 3.5* 3.6*   CBG: Recent Labs  Lab 04/10/19 2313 04/11/19 0423 04/11/19 0742 04/11/19 1137 04/11/19 1555  GLUCAP 316* 249* 247* 281* 398*   Lipid Profile: Recent Labs    04/09/19 1331  TRIG 164*   Anemia Panel: Recent Labs    04/10/19 0540 04/11/19 0542  FERRITIN 157 273   Sepsis Labs: Recent Labs  Lab 04/09/19 1331 04/09/19 1547  PROCALCITON 1.42  --   LATICACIDVEN 2.9* 2.0*    Recent Results (from the past 240 hour(s))  SARS Coronavirus 2 by RT PCR (hospital order, performed in [place] hospital lab) Nasopharyngeal Nasopharyngeal Swab     Status: Abnormal   Collection Time: 04/09/19 12:47 PM   Specimen: Nasopharyngeal Swab  Result Value Ref Range Status   SARS Coronavirus 2 POSITIVE (A) NEGATIVE Final    Comment: RESULT CALLED TO, READ BACK BY AND VERIFIED WITH:  A. TUTTLE,RN  @1341   10/11/2020KAY (NOTE) If result is NEGATIVE SARS-CoV-2 target nucleic acids are NOT DETECTED. The SARS-CoV-2  RNA is generally detectable in upper and lower  respiratory specimens during the acute phase of infection. The lowest  concentration of SARS-CoV-2 viral copies this assay can detect is 250  copies / mL. A negative result does not preclude SARS-CoV-2 infection  and should not be used as the sole basis for treatment or other  patient management decisions.  A negative result may occur with  improper specimen collection / handling, submission of specimen other  than nasopharyngeal swab, presence of viral mutation(s) within the  areas targeted by this assay, and inadequate number of viral copies  (<250 copies / mL). A negative result must be combined with clinical  observations, patient history, and epidemiological information. If result is POSITIVE SARS-CoV-2 target nucleic acids are DETECTED. T he SARS-CoV-2 RNA is generally detectable in upper and lower  respiratory specimens during the acute phase of  infection.  Positive  results are indicative of active infection with SARS-CoV-2.  Clinical  correlation with patient history and other diagnostic information is  necessary to determine patient infection status.  Positive results do  not rule out bacterial infection or co-infection with other viruses. If result is PRESUMPTIVE POSTIVE SARS-CoV-2 nucleic acids MAY BE PRESENT.   A presumptive positive result was obtained on the submitted specimen  and confirmed on repeat testing.  While 2019 novel coronavirus  (SARS-CoV-2) nucleic acids may be present in the submitted sample  additional confirmatory testing may be necessary for epidemiological  and / or clinical management purposes  to differentiate between  SARS-CoV-2 and other Sarbecovirus currently known to infect humans.  If clinically indicated additional testing with an alternate test  methodology [phone]) is  advised. The SARS-CoV-2 RNA is generally  detectable in upper and lower respiratory specimens during the acute  phase of infection. The expected result is Negative. Fact Sheet for Patients:  StrictlyIdeas.no Fact Sheet for Healthcare Providers: BankingDealers.co.za This test is not yet approved or cleared by the [place] FDA and has been authorized for detection and/or diagnosis of SARS-CoV-2 by FDA under an Emergency Use Authorization (EUA).  This EUA will remain in effect (meaning this test can be used) for the duration of the COVID-19 declaration under Section 564(b)(1) of the Act, 21 U.S.C. section 360bbb-3(b)(1), unless the authorization is terminated or revoked sooner. Performed at [facility], [address] [place], [place] [address]   Blood Culture (routine x 2)     Status: None (Preliminary result)   Collection Time: 04/09/19  2:25 PM   Specimen: Left Antecubital; Blood  Result Value Ref Range Status   Specimen Description   Final    LEFT ANTECUBITAL BOTTLES  DRAWN AEROBIC AND ANAEROBIC   Special Requests Blood Culture adequate volume  Final   Culture   Final    NO GROWTH 2 DAYS Performed at [facility], [address], [place], [place] [address]    Report Status PENDING  Incomplete  Blood Culture (routine x 2)     Status: None (Preliminary result)   Collection Time: 04/09/19  2:26 PM   Specimen: BLOOD LEFT HAND  Result Value Ref Range Status   Specimen Description   Final    BLOOD LEFT HAND BOTTLES DRAWN AEROBIC AND ANAEROBIC   Special Requests Blood Culture adequate volume  Final   Culture   Final    NO GROWTH 2 DAYS Performed at [facility], [address], [place], [place] [address]    Report Status PENDING  Incomplete  MRSA PCR Screening  Status: None   Collection Time: 04/09/19 11:55 PM   Specimen: Nasopharyngeal  Result Value Ref Range Status   MRSA by PCR NEGATIVE NEGATIVE Final    Comment:        The GeneXpert MRSA Assay (FDA approved for NASAL specimens only), is one component of a comprehensive MRSA colonization surveillance program. It is not intended to diagnose MRSA infection nor to guide or monitor treatment for MRSA infections. Performed at [facility], [place] [address] [place], [place] [address]     Radiology Studies: No results found.      Scheduled Meds: . busPIRone  15 mg Oral BID  . Chlorhexidine Gluconate Cloth  6 each Topical Daily  . dexamethasone (DECADRON) injection  6 mg Intravenous Q24H  . dextromethorphan-guaiFENesin  1 tablet Oral BID  . DULoxetine  60 mg Oral Daily  . fentaNYL  1 patch Transdermal Q72H  . furosemide  80 mg Oral BID  . insulin aspart  0-20 Units Subcutaneous Q4H  . insulin glargine  100 Units Subcutaneous Daily  . lamoTRIgine  200 mg Oral BID  . levETIRAcetam  1,000 mg Oral BID  . loratadine  10 mg Oral Daily  . pantoprazole  40 mg Oral Daily  . potassium chloride  40 mEq Oral Daily  . pregabalin  50 mg Oral TID  . simvastatin  20 mg Oral QHS   . tamsulosin  0.4 mg Oral QPC supper  . Warfarin - Pharmacist Dosing Inpatient   Does not apply q1800   Continuous Infusions: . sodium chloride 10 mL/hr at 04/11/19 1600  . remdesivir 100 mg in NS 250 mL Stopped (04/11/19 1019)     LOS: 2 days   Time spent: 56 min  Little Ishikawa, DO [facility]  If 7PM-7AM, please contact night-coverage [email or web address] Password [email or web address] 04/11/2019, 6:09 PM

## 2019-04-12 DIAGNOSIS — Z86711 Personal history of pulmonary embolism: Secondary | ICD-10-CM | POA: Diagnosis present

## 2019-04-12 DIAGNOSIS — J1289 Other viral pneumonia: Secondary | ICD-10-CM | POA: Diagnosis not present

## 2019-04-12 DIAGNOSIS — N1832 Chronic kidney disease, stage 3b: Secondary | ICD-10-CM | POA: Diagnosis present

## 2019-04-12 DIAGNOSIS — J9611 Chronic respiratory failure with hypoxia: Secondary | ICD-10-CM | POA: Diagnosis present

## 2019-04-12 DIAGNOSIS — U071 COVID-19: Secondary | ICD-10-CM | POA: Diagnosis not present

## 2019-04-12 DIAGNOSIS — E662 Morbid (severe) obesity with alveolar hypoventilation: Secondary | ICD-10-CM

## 2019-04-12 DIAGNOSIS — R569 Unspecified convulsions: Secondary | ICD-10-CM

## 2019-04-12 DIAGNOSIS — IMO0002 Reserved for concepts with insufficient information to code with codable children: Secondary | ICD-10-CM | POA: Diagnosis present

## 2019-04-12 DIAGNOSIS — Z6841 Body Mass Index (BMI) 40.0 and over, adult: Secondary | ICD-10-CM

## 2019-04-12 DIAGNOSIS — G4733 Obstructive sleep apnea (adult) (pediatric): Secondary | ICD-10-CM

## 2019-04-12 DIAGNOSIS — N179 Acute kidney failure, unspecified: Secondary | ICD-10-CM | POA: Diagnosis not present

## 2019-04-12 DIAGNOSIS — E118 Type 2 diabetes mellitus with unspecified complications: Secondary | ICD-10-CM

## 2019-04-12 DIAGNOSIS — E1165 Type 2 diabetes mellitus with hyperglycemia: Secondary | ICD-10-CM

## 2019-04-12 LAB — COMPREHENSIVE METABOLIC PANEL
ALT: 31 U/L (ref 0–44)
AST: 77 U/L — ABNORMAL HIGH (ref 15–41)
Albumin: 2.3 g/dL — ABNORMAL LOW (ref 3.5–5.0)
Alkaline Phosphatase: 81 U/L (ref 38–126)
Anion gap: 11 (ref 5–15)
BUN: 99 mg/dL — ABNORMAL HIGH (ref 6–20)
CO2: 28 mmol/L (ref 22–32)
Calcium: 8.1 mg/dL — ABNORMAL LOW (ref 8.9–10.3)
Chloride: 93 mmol/L — ABNORMAL LOW (ref 98–111)
Creatinine, Ser: 4.76 mg/dL — ABNORMAL HIGH (ref 0.61–1.24)
GFR calc Af Amer: 15 mL/min — ABNORMAL LOW (ref >60)
GFR calc non Af Amer: 13 mL/min — ABNORMAL LOW (ref >60)
Glucose, Bld: 277 mg/dL — ABNORMAL HIGH (ref 70–99)
Potassium: 4.1 mmol/L (ref 3.5–5.1)
Sodium: 132 mmol/L — ABNORMAL LOW (ref 135–145)
Total Bilirubin: 1 mg/dL (ref 0.3–1.2)
Total Protein: 6.5 g/dL (ref 6.5–8.1)

## 2019-04-12 LAB — CBC WITH DIFFERENTIAL/PLATELET
Abs Immature Granulocytes: 0.05 10*3/uL (ref 0.00–0.07)
Basophils Absolute: 0 10*3/uL (ref 0.0–0.1)
Basophils Relative: 0 %
Eosinophils Absolute: 0 10*3/uL (ref 0.0–0.5)
Eosinophils Relative: 0 %
HCT: 32.4 % — ABNORMAL LOW (ref 39.0–52.0)
Hemoglobin: 10.3 g/dL — ABNORMAL LOW (ref 13.0–17.0)
Immature Granulocytes: 2 %
Lymphocytes Relative: 16 %
Lymphs Abs: 0.5 10*3/uL — ABNORMAL LOW (ref 0.7–4.0)
MCH: 29.6 pg (ref 26.0–34.0)
MCHC: 31.8 g/dL (ref 30.0–36.0)
MCV: 93.1 fL (ref 80.0–100.0)
Monocytes Absolute: 0.2 10*3/uL (ref 0.1–1.0)
Monocytes Relative: 6 %
Neutro Abs: 2.6 10*3/uL (ref 1.7–7.7)
Neutrophils Relative %: 76 %
Platelets: 103 10*3/uL — ABNORMAL LOW (ref 150–400)
RBC: 3.48 MIL/uL — ABNORMAL LOW (ref 4.22–5.81)
RDW: 13.3 % (ref 11.5–15.5)
WBC: 3.4 10*3/uL — ABNORMAL LOW (ref 4.0–10.5)
nRBC: 0 % (ref 0.0–0.2)

## 2019-04-12 LAB — PROTIME-INR
INR: 3 — ABNORMAL HIGH (ref 0.8–1.2)
Prothrombin Time: 30.3 seconds — ABNORMAL HIGH (ref 11.4–15.2)

## 2019-04-12 LAB — GLUCOSE, CAPILLARY
Glucose-Capillary: 255 mg/dL — ABNORMAL HIGH (ref 70–99)
Glucose-Capillary: 259 mg/dL — ABNORMAL HIGH (ref 70–99)
Glucose-Capillary: 287 mg/dL — ABNORMAL HIGH (ref 70–99)
Glucose-Capillary: 289 mg/dL — ABNORMAL HIGH (ref 70–99)
Glucose-Capillary: 293 mg/dL — ABNORMAL HIGH (ref 70–99)
Glucose-Capillary: 307 mg/dL — ABNORMAL HIGH (ref 70–99)
Glucose-Capillary: 325 mg/dL — ABNORMAL HIGH (ref 70–99)

## 2019-04-12 LAB — FERRITIN: Ferritin: 181 ng/mL (ref 24–336)

## 2019-04-12 LAB — C-REACTIVE PROTEIN: CRP: 5.6 mg/dL — ABNORMAL HIGH (ref <1.0)

## 2019-04-12 LAB — D-DIMER, QUANTITATIVE: D-Dimer, Quant: 0.48 ug/mL-FEU (ref 0.00–0.50)

## 2019-04-12 MED ORDER — HYDROCODONE-ACETAMINOPHEN 7.5-325 MG PO TABS: 1.0000 | ORAL_TABLET | Freq: Once | ORAL | Status: DC | PRN

## 2019-04-12 MED ORDER — INSULIN ASPART 100 UNIT/ML ~~LOC~~ SOLN
0.0000 [IU] | Freq: Every day | SUBCUTANEOUS | Status: DC
  Administered 2019-04-12 – 2019-04-19 (×3): 3 [IU] via SUBCUTANEOUS
  Administered 2019-04-20 – 2019-04-21 (×2): 2 [IU] via SUBCUTANEOUS
  Administered 2019-04-22 – 2019-04-23 (×2): 3 [IU] via SUBCUTANEOUS
  Administered 2019-04-24: 2 [IU] via SUBCUTANEOUS

## 2019-04-12 MED ORDER — INSULIN ASPART 100 UNIT/ML ~~LOC~~ SOLN
7.0000 [IU] | Freq: Three times a day (TID) | SUBCUTANEOUS | Status: DC
  Administered 2019-04-12 – 2019-04-13 (×4): 7 [IU] via SUBCUTANEOUS

## 2019-04-12 MED ORDER — HYDROCODONE-ACETAMINOPHEN 7.5-325 MG/15ML PO SOLN
15.0000 mL | Freq: Once | ORAL | Status: DC | PRN
  Filled 2019-04-12: qty 15

## 2019-04-12 MED ORDER — INSULIN ASPART 100 UNIT/ML ~~LOC~~ SOLN
0.0000 [IU] | Freq: Three times a day (TID) | SUBCUTANEOUS | Status: DC
  Administered 2019-04-12 (×2): 11 [IU] via SUBCUTANEOUS
  Administered 2019-04-13: 7 [IU] via SUBCUTANEOUS
  Administered 2019-04-13: 11 [IU] via SUBCUTANEOUS
  Administered 2019-04-13: 7 [IU] via SUBCUTANEOUS
  Administered 2019-04-14: 4 [IU] via SUBCUTANEOUS
  Administered 2019-04-14: 3 [IU] via SUBCUTANEOUS
  Administered 2019-04-15: 11 [IU] via SUBCUTANEOUS
  Administered 2019-04-15: 3 [IU] via SUBCUTANEOUS
  Administered 2019-04-15: 15 [IU] via SUBCUTANEOUS
  Administered 2019-04-16: 12:00:00 12 [IU] via SUBCUTANEOUS
  Administered 2019-04-16: 7 [IU] via SUBCUTANEOUS
  Administered 2019-04-16: 11 [IU] via SUBCUTANEOUS
  Administered 2019-04-17: 4 [IU] via SUBCUTANEOUS
  Administered 2019-04-17: 3 [IU] via SUBCUTANEOUS
  Administered 2019-04-17: 4 [IU] via SUBCUTANEOUS
  Administered 2019-04-18 – 2019-04-19 (×4): 3 [IU] via SUBCUTANEOUS
  Administered 2019-04-19: 4 [IU] via SUBCUTANEOUS
  Administered 2019-04-20: 7 [IU] via SUBCUTANEOUS
  Administered 2019-04-21: 3 [IU] via SUBCUTANEOUS
  Administered 2019-04-21: 7 [IU] via SUBCUTANEOUS
  Administered 2019-04-22: 17:00:00 15 [IU] via SUBCUTANEOUS
  Administered 2019-04-22: 4 [IU] via SUBCUTANEOUS
  Administered 2019-04-22: 08:00:00 3 [IU] via SUBCUTANEOUS
  Administered 2019-04-23 (×2): 20 [IU] via SUBCUTANEOUS
  Administered 2019-04-23: 11 [IU] via SUBCUTANEOUS
  Administered 2019-04-24 (×3): 4 [IU] via SUBCUTANEOUS
  Administered 2019-04-25: 7 [IU] via SUBCUTANEOUS
  Administered 2019-04-25: 11 [IU] via SUBCUTANEOUS
  Administered 2019-04-25: 4 [IU] via SUBCUTANEOUS

## 2019-04-12 MED ORDER — INSULIN GLARGINE 100 UNIT/ML ~~LOC~~ SOLN
60.0000 [IU] | Freq: Two times a day (BID) | SUBCUTANEOUS | Status: DC
  Administered 2019-04-13 – 2019-04-16 (×7): 60 [IU] via SUBCUTANEOUS
  Filled 2019-04-12 (×7): qty 0.6

## 2019-04-12 MED ORDER — WARFARIN SODIUM 2.5 MG PO TABS
2.5000 mg | ORAL_TABLET | Freq: Once | ORAL | Status: AC
  Administered 2019-04-12: 2.5 mg via ORAL
  Filled 2019-04-12: qty 1
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
## 2019-04-12 NOTE — Progress Notes (Signed)
ANTICOAGULATION CONSULT NOTE - Follow Up Consult  Pharmacy Consult for warfarin Indication: history of pulmonary embolus and DVT  Allergies  Allergen Reactions  . Bee Venom Anaphylaxis, Shortness Of Breath and Swelling  . Other Shortness Of Breath    Itching, rash with IVP DYE, iodine, shellfish LATEX  . Penicillins Anaphylaxis and Shortness Of Breath    Has patient had a PCN reaction causing immediate rash, facial/tongue/throat swelling, SOB or lightheadedness with hypotension: Yes Has patient had a PCN reaction causing severe rash involving mucus membranes or skin necrosis: No Has patient had a PCN reaction that required hospitalization No Has patient had a PCN reaction occurring within the last 10 years: No If all of the above answers are "NO", then may proceed with Cephalosporin use.   . Shellfish Allergy Nausea And Vomiting and Other (See Comments)    Feels like insides are twisting  . Iodinated Diagnostic Agents     Other reaction(s): RASH  . Iohexol      Code: RASH, Desc: PT WAS ON PREDNISONE FOR GOUT TX. @ TIME OF SCAN AND RECEIVED 50 MG OF BENADRYL IV-ARS 10/08/07   . Iodine Rash  . Latex Rash    Patient Measurements: Height: 6\' 3"  (190.5 cm) Weight: (!) 462 lb (209.6 kg) IBW/kg (Calculated) : 84.5   Vital Signs: BP: 140/66 (10/14 1000) Pulse Rate: 79 (10/14 1000)  Labs: Recent Labs    04/10/19 0540 04/11/19 0542 04/12/19 0540  HGB 10.4* 10.8* 10.3*  HCT 32.4* 34.3* 32.4*  PLT 91* 112* 103*  LABPROT 34.2* 35.3* 30.3*  INR 3.5* 3.6* 3.0*  CREATININE 5.13* 4.98* 4.76*    Estimated Creatinine Clearance: 34.5 mL/min (A) (by C-G formula based on SCr of 4.76 mg/dL (H)).   Medical History: Past Medical History:  Diagnosis Date  . Anemia   . Anxiety   . Arthritis   . Bipolar 1 disorder ([place])   . Bronchitis    hx of  . Bruises easily   . Chronic kidney disease    decreased left kidney fx  . Chronic pain syndrome 05/11/2012  . Chronic respiratory  failure with hypoxia ([facility])    And with hypercapnia  . Diabetes mellitus without complication ([place])   . Diabetic neuropathy ([place])   . DVT (deep venous thrombosis) ([facility])    LLE DVT ~ '12  . Dyspnea    with ambulation  . GERD (gastroesophageal reflux disease)   . HOH (hard of hearing) 2015   has hearing aids  . Mental disorder   . Migraine   . Neuromuscular disorder ([place])   . Obesity hypoventilation syndrome ([place])   . Obstructive sleep apnea    CPAP  . PE (pulmonary embolism)    bilateral PE ~ '11  . Seizures ([place])   . Thrombocytopenia ([place]) 05/11/2012    Medications:  Scheduled:  . busPIRone  15 mg Oral BID  . Chlorhexidine Gluconate Cloth  6 each Topical Daily  . dexamethasone (DECADRON) injection  6 mg Intravenous Q24H  . dextromethorphan-guaiFENesin  1 tablet Oral BID  . DULoxetine  60 mg Oral Daily  . fentaNYL  1 patch Transdermal Q72H  . furosemide  80 mg Oral BID  . insulin aspart  0-20 Units Subcutaneous TID WC  . insulin aspart  0-5 Units Subcutaneous QHS  . insulin aspart  7 Units Subcutaneous TID WC  . [START ON 04/13/2019] insulin glargine  60 Units Subcutaneous BID  . lamoTRIgine  200 mg Oral BID  . levETIRAcetam  1,000 mg Oral BID  . loratadine  10 mg Oral Daily  . pantoprazole  40 mg Oral Daily  . potassium chloride  40 mEq Oral Daily  . pregabalin  50 mg Oral TID  . simvastatin  20 mg Oral QHS  . tamsulosin  0.4 mg Oral QPC supper  . Warfarin - Pharmacist Dosing Inpatient   Does not apply q1800    Assessment: 53 yo male admitted on 04/09/2019 for SOB and found to be positive for COVID-19. Pharmacy is consulted to dose warfarin for history of DVT and PE. Patient is on warfarin prior to admission. On admission, INR was supratherapeutic and warfarin has been held for three days (10/11-10/13). Today, INR of 3.0 is therapeutic. Will resume warfarin tonight at 1/2 home regimen dose due to INR at upper range of goal limit (2-3) and the potential for dexamethasone  and decreased intake to cause increased INR. Hgb 10.3 and platelets 103 (baseline ~100-120k). No reported bleeding.   Prior to Admission Warfarin Regimen: warfarin 5mg  daily  Goal of Therapy:  INR 2-3 Monitor platelets by anticoagulation protocol: Yes   Plan:  Warfarin 2.5mg  x1 on 10/14 Monitor daily INR, CBC, and S/S of bleeding   Cristela Felt, PharmD PGY1 Pharmacy Resident [employer]: [phone]  04/12/2019,12:20 PM

## 2019-04-12 NOTE — Progress Notes (Addendum)
PROGRESS NOTE    [name]  [id number] DOB: [birth year] DOA: 04/09/2019 PCP: Sharion Balloon, FNP   Brief Narrative:  Iric Beymer Griffinis a 53 y.o.WM PMHx: morbid obesity, seizures,diabetes type 2 uncontrolled with complication, depression, BPH, OSA,/OHS chronic respiratory failure on 2-3  L O2 home O2, history of PE and DVT chronic anticoagulation (warfarin).  Patient presented to the ED with complaints of difficulty breathing. Of 2 days duration., Patient's wife tested positive for COVID this past week. Spouse is a Therapist, music at [employer]. In ED patient was noted to be afebrile 98.5. O2 sats initially 86% on nasal cannula, patient was subsequently placed on nonrebreather mask with O2 sats increasing to 87%,he was subsequently switched to BiPAP, with O2 sats maintaining greater than 96%.Portable chest x-ray showed patchy bilateral airspace disease worrisome for pneumonia, including atypical/viral infection. Lactic acid 2.9 >> 2,without fluids. Patient has a DNR form at home, initially refused intubation but later he requestedthat he not beintubated unless he"crashesand burns". Patient spouse was called on the phone, shealso confirmed patient was DNR.EDPtells me he talked to patient in detail about this-he agrees to BiPAP,but he has been told that he cannot be transported with BiPAP.Hewants to be transported with nonrebreather ornasal cannula.Hospitalist to admit for COVID 19pneumonia.   Subjective: 10/14 A/O x4, positive S OB, negative CP, negative abdominal pain, negative N/V     Assessment & Plan:   Active Problems:   OSA (obstructive sleep apnea)   Obesity hypoventilation syndrome (HCC)   Pneumonia due to COVID-19 virus   Pressure injury of skin   Chronic respiratory failure with hypoxia (HCC)   History of pulmonary embolus (PE)   Morbid obesity with BMI of 60.0-69.9, adult (HCC)   Seizures (HCC)   Diabetes mellitus type 2,  uncontrolled, with complications (HCC)   Acute renal failure superimposed on stage 3b chronic kidney disease (HCC)  Acute on chronic respiratory failure with hypoxia/Covid 19 pneumonia POA -Baseline 2 -3 L O2 nasal cannula, 24 hours a day. -Remdesivir per pharmacy -Decadron 6 mg x 10 days -Titrate O2 to maintain SPO2> 88% COVID-19 Labs  Recent Labs    04/10/19 0540 04/11/19 0542 04/12/19 0540  DDIMER 0.72* 0.61* 0.48  FERRITIN 157 273 181  CRP 13.7* 10.3* 5.6*   Lab Results  Component Value Date   SARSCOV2NAA POSITIVE (A) 04/09/2019   OSA/OHS -See respiratory failure  Pulmonary embolus/DVT -On chronic warfarin Recent Labs  Lab 04/09/19 1331 04/10/19 0540 04/11/19 0542 04/12/19 0540  INR 3.8* 3.5* 3.6* 3.0*  -Warfarin per pharmacy  Acute on CKD stage III (baseline Cr~3.5) -Avoid all nephrotoxic medication -Monitor closely Recent Labs  Lab 04/09/19 1331 04/10/19 0540 04/11/19 0542 04/12/19 0540  CREATININE 4.84* 5.13* 4.98* 4.76*  -Dr. Vanetta Mulders patient's nephrologist  Lactic acidosis  -Resolved  Hx seizure -Stable -Continue home medication Keppra, Lamictal -Seizure precautions   Diabetes type 2 uncontrolled with complication -[id number] hemoglobin A1c= 7.9 -10/15 Lantus 60 units BID -10/14 NovoLog 7 units qac -Resistant SSI -Lipid panel pending  Morbid obesity -Diet/exercise; per patient minimal physical exertion.  Pivots from bed into motorized wheelchair. -10/14 PT/OT consult placed  Depression/bipolar disorder -Continue home medication buspirone, Cymbalta, Lyrica     DVT prophylaxis: Warfarin per pharmacy Code Status: Full Family Communication:  10/14spoke with to see his wife) explained plan of care answered all questions Disposition Plan: TBD   Consultants:  PCCM    Procedures/Significant Events:     I have personally reviewed and interpreted all  radiology studies and my findings are as above.  VENTILATOR  SETTINGS: HFNC Flow rate; 13 L/min   Cultures   Antimicrobials: Anti-infectives (From admission, onward)   Start     Stop   04/11/19 1000  remdesivir 100 mg in sodium chloride 0.9 % 250 mL IVPB     04/15/19 0959   04/10/19 0100  remdesivir 200 mg in sodium chloride 0.9 % 250 mL IVPB     04/10/19 0138       Devices    LINES / TUBES:      Continuous Infusions: . sodium chloride 10 mL/hr at 04/12/19 1400  . remdesivir 100 mg in NS 250 mL Stopped (04/12/19 0946)     Objective: Vitals:   04/12/19 1100 04/12/19 1200 04/12/19 1300 04/12/19 1400  BP: (!) 149/66 (!) 145/77 (!) 148/100 (!) 157/70  Pulse: 77 77 80 81  Resp: 12 12 16 12   Temp:  98.5 F (36.9 C)    TempSrc:  Oral    SpO2: (!) 88% (!) 87% 90% 91%  Weight:      Height:        Intake/Output Summary (Last 24 hours) at 04/12/2019 1838 Last data filed at 04/12/2019 1400 Gross per 24 hour  Intake 1164.97 ml  Output 2500 ml  Net -1335.03 ml   Filed Weights   04/09/19 1215 04/10/19 0045  Weight: (!) 192.8 kg (!) 209.6 kg    Examination:  General: A/O x4, acute on chronic Respiratory distress Eyes: negative scleral hemorrhage, negative anisocoria, negative icterus ENT: Negative Runny nose, negative gingival bleeding, Neck:  Negative scars, masses, torticollis, lymphadenopathy, JVD Lungs: Clear to auscultation bilaterally without wheezes or crackles Cardiovascular: Regular rate and rhythm without murmur gallop or rub normal S1 and S2 Abdomen: MORBIDLY OBESE negative abdominal pain, nondistended, positive soft, bowel sounds, no rebound, no ascites, no appreciable mass Extremities: No significant cyanosis, clubbing, or edema bilateral lower extremities Skin: Negative rashes, lesions, ulcers Psychiatric:  Negative depression, negative anxiety, negative fatigue, negative mania  Central nervous system:  Cranial nerves II through XII intact, tongue/uvula midline, all extremities muscle strength 5/5, sensation  intact throughout, , negative dysarthria, negative expressive aphasia, negative receptive aphasia.  .     Data Reviewed: Care during the described time interval was provided by me .  I have reviewed this patient's available data, including medical history, events of note, physical examination, and all test results as part of my evaluation.   CBC: Recent Labs  Lab 04/09/19 1331 04/10/19 0540 04/11/19 0542 04/12/19 0540  WBC 5.9 3.3* 3.3* 3.4*  NEUTROABS 4.4 2.4 2.5 2.6  HGB 11.2* 10.4* 10.8* 10.3*  HCT 34.8* 32.4* 34.3* 32.4*  MCV 94.3 93.4 94.2 93.1  PLT 104* 91* 112* [id number]*   Basic Metabolic Panel: Recent Labs  Lab 04/09/19 1331 04/10/19 0540 04/11/19 0542 04/12/19 0540  NA 133* 134* 135 132*  K 3.4* 3.6 3.9 4.1  CL 95* 95* 93* 93*  CO2 24 26 28 28   GLUCOSE 212* 257* 267* 277*  BUN 72* 78* 94* 99*  CREATININE 4.84* 5.13* 4.98* 4.76*  CALCIUM 7.6* 7.9* 8.3* 8.1*  MG 1.8  --   --   --    GFR: Estimated Creatinine Clearance: 34.5 mL/min (A) (by C-G formula based on SCr of 4.76 mg/dL (H)). Liver Function Tests: Recent Labs  Lab 04/09/19 1331 04/10/19 0540 04/11/19 0542 04/12/19 0540  AST 82* 108* 96* 77*  ALT 28 31 32 31  ALKPHOS 74 75 73 81  BILITOT 0.6 0.8 1.0 1.0  PROT 6.7 6.4* 6.8 6.5  ALBUMIN 2.3* 2.2* 2.3* 2.3*   No results for input(s): LIPASE, AMYLASE in the last 168 hours. No results for input(s): AMMONIA in the last 168 hours. Coagulation Profile: Recent Labs  Lab 04/09/19 1331 04/10/19 0540 04/11/19 0542 04/12/19 0540  INR 3.8* 3.5* 3.6* 3.0*   Cardiac Enzymes: No results for input(s): CKTOTAL, CKMB, CKMBINDEX, TROPONINI in the last 168 hours. BNP (last 3 results) No results for input(s): PROBNP in the last 8760 hours. HbA1C: Recent Labs    04/10/19 0540  HGBA1C 7.9*   CBG: Recent Labs  Lab 04/11/19 2354 04/12/19 0445 04/12/19 0730 04/12/19 1317 04/12/19 1621  GLUCAP 307* 289* 255* 325* 287*   Lipid Profile: No results for  input(s): CHOL, HDL, LDLCALC, TRIG, CHOLHDL, LDLDIRECT in the last 72 hours. Thyroid Function Tests: No results for input(s): TSH, T4TOTAL, FREET4, T3FREE, THYROIDAB in the last 72 hours. Anemia Panel: Recent Labs    04/11/19 0542 04/12/19 0540  FERRITIN 273 181   Urine analysis:    Component Value Date/Time   COLORURINE YELLOW 01/26/2019 1834   APPEARANCEUR CLEAR 01/26/2019 1834   APPEARANCEUR Clear 10/24/2018 1235   LABSPEC 1.010 01/26/2019 1834   PHURINE 6.0 01/26/2019 1834   GLUCOSEU >=500 (A) 01/26/2019 1834   HGBUR LARGE (A) 01/26/2019 1834   BILIRUBINUR NEGATIVE 01/26/2019 1834   BILIRUBINUR Negative 10/24/2018 [place] 01/26/2019 1834   PROTEINUR 100 (A) 01/26/2019 1834   UROBILINOGEN negative 02/22/2015 1427   UROBILINOGEN 0.2 10/17/2010 0951   NITRITE NEGATIVE 01/26/2019 1834   LEUKOCYTESUR NEGATIVE 01/26/2019 1834   Sepsis Labs: @LABRCNTIP (procalcitonin:4,lacticidven:4)  ) Recent Results (from the past 240 hour(s))  SARS Coronavirus 2 by RT PCR (hospital order, performed in [place] hospital lab) Nasopharyngeal Nasopharyngeal Swab     Status: Abnormal   Collection Time: 04/09/19 12:47 PM   Specimen: Nasopharyngeal Swab  Result Value Ref Range Status   SARS Coronavirus 2 POSITIVE (A) NEGATIVE Final    Comment: RESULT CALLED TO, READ BACK BY AND VERIFIED WITH:  A. TUTTLE,RN  @1341   10/11/2020KAY (NOTE) If result is NEGATIVE SARS-CoV-2 target nucleic acids are NOT DETECTED. The SARS-CoV-2 RNA is generally detectable in upper and lower  respiratory specimens during the acute phase of infection. The lowest  concentration of SARS-CoV-2 viral copies this assay can detect is 250  copies / mL. A negative result does not preclude SARS-CoV-2 infection  and should not be used as the sole basis for treatment or other  patient management decisions.  A negative result may occur with  improper specimen collection / handling, submission of specimen  other  than nasopharyngeal swab, presence of viral mutation(s) within the  areas targeted by this assay, and inadequate number of viral copies  (<250 copies / mL). A negative result must be combined with clinical  observations, patient history, and epidemiological information. If result is POSITIVE SARS-CoV-2 target nucleic acids are DETECTED. T he SARS-CoV-2 RNA is generally detectable in upper and lower  respiratory specimens during the acute phase of infection.  Positive  results are indicative of active infection with SARS-CoV-2.  Clinical  correlation with patient history and other diagnostic information is  necessary to determine patient infection status.  Positive results do  not rule out bacterial infection or co-infection with other viruses. If result is PRESUMPTIVE POSTIVE SARS-CoV-2 nucleic acids MAY BE PRESENT.   A presumptive positive result was obtained on the submitted specimen  and confirmed on repeat testing.  While 2019 novel coronavirus  (SARS-CoV-2) nucleic acids may be present in the submitted sample  additional confirmatory testing may be necessary for epidemiological  and / or clinical management purposes  to differentiate between  SARS-CoV-2 and other Sarbecovirus currently known to infect humans.  If clinically indicated additional testing with an alternate test  methodology [phone]) is  advised. The SARS-CoV-2 RNA is generally  detectable in upper and lower respiratory specimens during the acute  phase of infection. The expected result is Negative. Fact Sheet for Patients:  StrictlyIdeas.no Fact Sheet for Healthcare Providers: BankingDealers.co.za This test is not yet approved or cleared by the [place] FDA and has been authorized for detection and/or diagnosis of SARS-CoV-2 by FDA under an Emergency Use Authorization (EUA).  This EUA will remain in effect (meaning this test can be used) for the duration  of the COVID-19 declaration under Section 564(b)(1) of the Act, 21 U.S.C. section 360bbb-3(b)(1), unless the authorization is terminated or revoked sooner. Performed at [facility], [address], [place], [place] [address]   Blood Culture (routine x 2)     Status: None (Preliminary result)   Collection Time: 04/09/19  2:25 PM   Specimen: Left Antecubital; Blood  Result Value Ref Range Status   Specimen Description   Final    LEFT ANTECUBITAL BOTTLES DRAWN AEROBIC AND ANAEROBIC   Special Requests Blood Culture adequate volume  Final   Culture   Final    NO GROWTH 3 DAYS Performed at [facility], [address] [place], [place] [address]    Report Status PENDING  Incomplete  Blood Culture (routine x 2)     Status: None (Preliminary result)   Collection Time: 04/09/19  2:26 PM   Specimen: BLOOD LEFT HAND  Result Value Ref Range Status   Specimen Description   Final    BLOOD LEFT HAND BOTTLES DRAWN AEROBIC AND ANAEROBIC   Special Requests Blood Culture adequate volume  Final   Culture   Final    NO GROWTH 3 DAYS Performed at [facility], [address] [place], [place] [address]    Report Status PENDING  Incomplete  MRSA PCR Screening     Status: None   Collection Time: 04/09/19 11:55 PM   Specimen: Nasopharyngeal  Result Value Ref Range Status   MRSA by PCR NEGATIVE NEGATIVE Final    Comment:        The GeneXpert MRSA Assay (FDA approved for NASAL specimens only), is one component of a comprehensive MRSA colonization surveillance program. It is not intended to diagnose MRSA infection nor to guide or monitor treatment for MRSA infections. Performed at [facility], [place] [address], [place],  [address]          Radiology Studies: No results found.      Scheduled Meds: . busPIRone  15 mg Oral BID  . Chlorhexidine Gluconate Cloth  6 each Topical Daily  . dexamethasone (DECADRON) injection  6 mg Intravenous Q24H  .  dextromethorphan-guaiFENesin  1 tablet Oral BID  . DULoxetine  60 mg Oral Daily  . fentaNYL  1 patch Transdermal Q72H  . furosemide  80 mg Oral BID  . insulin aspart  0-20 Units Subcutaneous TID WC  . insulin aspart  0-5 Units Subcutaneous QHS  . insulin aspart  7 Units Subcutaneous TID WC  . [START ON 04/13/2019] insulin glargine  60 Units Subcutaneous BID  . lamoTRIgine  200 mg Oral BID  .  levETIRAcetam  1,000 mg Oral BID  . loratadine  10 mg Oral Daily  . pantoprazole  40 mg Oral Daily  . potassium chloride  40 mEq Oral Daily  . pregabalin  50 mg Oral TID  . simvastatin  20 mg Oral QHS  . tamsulosin  0.4 mg Oral QPC supper  . Warfarin - Pharmacist Dosing Inpatient   Does not apply q1800   Continuous Infusions: . sodium chloride 10 mL/hr at 04/12/19 1400  . remdesivir 100 mg in NS 250 mL Stopped (04/12/19 0946)     LOS: 3 days   The patient is critically ill with multiple organ systems failure and requires high complexity decision making for assessment and support, frequent evaluation and titration of therapies, application of advanced monitoring technologies and extensive interpretation of multiple databases. Critical Care Time devoted to patient care services described in this note  Time spent: 40 minutes     Jennavie Martinek, Geraldo Docker, MD [facility] Pager [phone]  If 7PM-7AM, please contact night-coverage [email or web address] Password [facility] 04/12/2019, 6:38 PM

## 2019-04-13 DIAGNOSIS — N179 Acute kidney failure, unspecified: Secondary | ICD-10-CM

## 2019-04-13 DIAGNOSIS — J9611 Chronic respiratory failure with hypoxia: Secondary | ICD-10-CM | POA: Diagnosis not present

## 2019-04-13 DIAGNOSIS — I2690 Septic pulmonary embolism without acute cor pulmonale: Secondary | ICD-10-CM

## 2019-04-13 DIAGNOSIS — E118 Type 2 diabetes mellitus with unspecified complications: Secondary | ICD-10-CM | POA: Diagnosis not present

## 2019-04-13 DIAGNOSIS — Z86711 Personal history of pulmonary embolism: Secondary | ICD-10-CM | POA: Diagnosis not present

## 2019-04-13 DIAGNOSIS — N1832 Chronic kidney disease, stage 3b: Secondary | ICD-10-CM

## 2019-04-13 DIAGNOSIS — J9621 Acute and chronic respiratory failure with hypoxia: Secondary | ICD-10-CM

## 2019-04-13 DIAGNOSIS — I82402 Acute embolism and thrombosis of unspecified deep veins of left lower extremity: Secondary | ICD-10-CM

## 2019-04-13 LAB — COMPREHENSIVE METABOLIC PANEL
ALT: 31 U/L (ref 0–44)
AST: 63 U/L — ABNORMAL HIGH (ref 15–41)
Albumin: 2.3 g/dL — ABNORMAL LOW (ref 3.5–5.0)
Alkaline Phosphatase: 79 U/L (ref 38–126)
Anion gap: 14 (ref 5–15)
BUN: 108 mg/dL — ABNORMAL HIGH (ref 6–20)
CO2: 26 mmol/L (ref 22–32)
Calcium: 8.3 mg/dL — ABNORMAL LOW (ref 8.9–10.3)
Chloride: 94 mmol/L — ABNORMAL LOW (ref 98–111)
Creatinine, Ser: 4.56 mg/dL — ABNORMAL HIGH (ref 0.61–1.24)
GFR calc Af Amer: 16 mL/min — ABNORMAL LOW (ref >60)
GFR calc non Af Amer: 14 mL/min — ABNORMAL LOW (ref >60)
Glucose, Bld: 244 mg/dL — ABNORMAL HIGH (ref 70–99)
Potassium: 4.2 mmol/L (ref 3.5–5.1)
Sodium: 134 mmol/L — ABNORMAL LOW (ref 135–145)
Total Bilirubin: 0.6 mg/dL (ref 0.3–1.2)
Total Protein: 6.5 g/dL (ref 6.5–8.1)

## 2019-04-13 LAB — CBC WITH DIFFERENTIAL/PLATELET
Abs Immature Granulocytes: 0.12 10*3/uL — ABNORMAL HIGH (ref 0.00–0.07)
Basophils Absolute: 0 10*3/uL (ref 0.0–0.1)
Basophils Relative: 0 %
Eosinophils Absolute: 0 10*3/uL (ref 0.0–0.5)
Eosinophils Relative: 0 %
HCT: 32.4 % — ABNORMAL LOW (ref 39.0–52.0)
Hemoglobin: 10.5 g/dL — ABNORMAL LOW (ref 13.0–17.0)
Immature Granulocytes: 3 %
Lymphocytes Relative: 15 %
Lymphs Abs: 0.6 10*3/uL — ABNORMAL LOW (ref 0.7–4.0)
MCH: 30.1 pg (ref 26.0–34.0)
MCHC: 32.4 g/dL (ref 30.0–36.0)
MCV: 92.8 fL (ref 80.0–100.0)
Monocytes Absolute: 0.3 10*3/uL (ref 0.1–1.0)
Monocytes Relative: 7 %
Neutro Abs: 3 10*3/uL (ref 1.7–7.7)
Neutrophils Relative %: 75 %
Platelets: 109 10*3/uL — ABNORMAL LOW (ref 150–400)
RBC: 3.49 MIL/uL — ABNORMAL LOW (ref 4.22–5.81)
RDW: 13.6 % (ref 11.5–15.5)
WBC: 4 10*3/uL (ref 4.0–10.5)
nRBC: 0.5 % — ABNORMAL HIGH (ref 0.0–0.2)

## 2019-04-13 LAB — LIPID PANEL
Cholesterol: 81 mg/dL (ref 0–200)
HDL: 16 mg/dL — ABNORMAL LOW (ref >40)
LDL Cholesterol: 33 mg/dL (ref 0–99)
Total CHOL/HDL Ratio: 5.1 RATIO
Triglycerides: 161 mg/dL — ABNORMAL HIGH (ref <150)
VLDL: 32 mg/dL (ref 0–40)

## 2019-04-13 LAB — GLUCOSE, CAPILLARY
Glucose-Capillary: 135 mg/dL — ABNORMAL HIGH (ref 70–99)
Glucose-Capillary: 204 mg/dL — ABNORMAL HIGH (ref 70–99)
Glucose-Capillary: 204 mg/dL — ABNORMAL HIGH (ref 70–99)
Glucose-Capillary: 268 mg/dL — ABNORMAL HIGH (ref 70–99)
Glucose-Capillary: 279 mg/dL — ABNORMAL HIGH (ref 70–99)
Glucose-Capillary: 285 mg/dL — ABNORMAL HIGH (ref 70–99)

## 2019-04-13 LAB — D-DIMER, QUANTITATIVE: D-Dimer, Quant: 0.42 ug/mL-FEU (ref 0.00–0.50)

## 2019-04-13 LAB — C-REACTIVE PROTEIN: CRP: 3.4 mg/dL — ABNORMAL HIGH (ref <1.0)

## 2019-04-13 LAB — PROTIME-INR
INR: 2.8 — ABNORMAL HIGH (ref 0.8–1.2)
Prothrombin Time: 29 seconds — ABNORMAL HIGH (ref 11.4–15.2)

## 2019-04-13 LAB — FERRITIN: Ferritin: 118 ng/mL (ref 24–336)

## 2019-04-13 MED ORDER — WARFARIN SODIUM 4 MG PO TABS
4.0000 mg | ORAL_TABLET | Freq: Once | ORAL | Status: AC
  Administered 2019-04-13: 4 mg via ORAL
  Filled 2019-04-13 (×2): qty 1

## 2019-04-13 MED ORDER — INSULIN ASPART 100 UNIT/ML ~~LOC~~ SOLN
12.0000 [IU] | Freq: Three times a day (TID) | SUBCUTANEOUS | Status: DC
  Administered 2019-04-13 – 2019-04-18 (×14): 12 [IU] via SUBCUTANEOUS

## 2019-04-13 NOTE — Progress Notes (Signed)
ANTICOAGULATION CONSULT NOTE - Follow Up Consult  Pharmacy Consult for warfarin Indication: history of pulmonary embolus and DVT  Allergies  Allergen Reactions  . Bee Venom Anaphylaxis, Shortness Of Breath and Swelling  . Other Shortness Of Breath    Itching, rash with IVP DYE, iodine, shellfish LATEX  . Penicillins Anaphylaxis and Shortness Of Breath    Has patient had a PCN reaction causing immediate rash, facial/tongue/throat swelling, SOB or lightheadedness with hypotension: Yes Has patient had a PCN reaction causing severe rash involving mucus membranes or skin necrosis: No Has patient had a PCN reaction that required hospitalization No Has patient had a PCN reaction occurring within the last 10 years: No If all of the above answers are "NO", then may proceed with Cephalosporin use.   . Shellfish Allergy Nausea And Vomiting and Other (See Comments)    Feels like insides are twisting  . Iodinated Diagnostic Agents     Other reaction(s): RASH  . Iohexol      Code: RASH, Desc: PT WAS ON PREDNISONE FOR GOUT TX. @ TIME OF SCAN AND RECEIVED 50 MG OF BENADRYL IV-ARS 10/08/07   . Iodine Rash  . Latex Rash    Patient Measurements: Height: 6\' 3"  (190.5 cm) Weight: (!) 462 lb (209.6 kg) IBW/kg (Calculated) : 84.5   Vital Signs: Temp: 98.2 F (36.8 C) (10/15 1200) Temp Source: Oral (10/15 1200) BP: 156/75 (10/15 1200) Pulse Rate: 89 (10/15 1200)  Labs: Recent Labs    04/11/19 0542 04/12/19 0540 04/13/19 0615  HGB 10.8* 10.3* 10.5*  HCT 34.3* 32.4* 32.4*  PLT 112* 103* 109*  LABPROT 35.3* 30.3* 29.0*  INR 3.6* 3.0* 2.8*  CREATININE 4.98* 4.76* 4.56*    Estimated Creatinine Clearance: 36.1 mL/min (A) (by C-G formula based on SCr of 4.56 mg/dL (H)).   Medical History: Past Medical History:  Diagnosis Date  . Anemia   . Anxiety   . Arthritis   . Bipolar 1 disorder ([place])   . Bronchitis    hx of  . Bruises easily   . Chronic kidney disease    decreased left  kidney fx  . Chronic pain syndrome 05/11/2012  . Chronic respiratory failure with hypoxia (HCC)    And with hypercapnia  . Diabetes mellitus without complication ([place])   . Diabetic neuropathy ([place])   . DVT (deep venous thrombosis) (HCC)    LLE DVT ~ '12  . Dyspnea    with ambulation  . GERD (gastroesophageal reflux disease)   . HOH (hard of hearing) 2015   has hearing aids  . Mental disorder   . Migraine   . Neuromuscular disorder ([place])   . Obesity hypoventilation syndrome ([place])   . Obstructive sleep apnea    CPAP  . PE (pulmonary embolism)    bilateral PE ~ '11  . Seizures ([place])   . Thrombocytopenia ([place]) 05/11/2012    Medications:  Scheduled:  . busPIRone  15 mg Oral BID  . Chlorhexidine Gluconate Cloth  6 each Topical Daily  . dexamethasone (DECADRON) injection  6 mg Intravenous Q24H  . dextromethorphan-guaiFENesin  1 tablet Oral BID  . DULoxetine  60 mg Oral Daily  . fentaNYL  1 patch Transdermal Q72H  . furosemide  80 mg Oral BID  . insulin aspart  0-20 Units Subcutaneous TID WC  . insulin aspart  0-5 Units Subcutaneous QHS  . insulin aspart  12 Units Subcutaneous TID WC  . insulin glargine  60 Units Subcutaneous BID  . lamoTRIgine  200 mg Oral BID  . levETIRAcetam  1,000 mg Oral BID  . loratadine  10 mg Oral Daily  . pantoprazole  40 mg Oral Daily  . potassium chloride  40 mEq Oral Daily  . pregabalin  50 mg Oral TID  . simvastatin  20 mg Oral QHS  . tamsulosin  0.4 mg Oral QPC supper  . Warfarin - Pharmacist Dosing Inpatient   Does not apply q1800    Assessment: 53 yo male admitted on 04/09/2019 for SOB and found to be positive for COVID-19. Pharmacy is consulted to dose warfarin for history of DVT and PE. Patient is on warfarin prior to admission. On admission, INR was supratherapeutic and warfarin was been held for three days (10/11-10/13). Warfarin was resumed on 10/14 when INR was therapeutic. Today, INR of 2.8 is therapeutic. Will dose warfarin slightly  lower than home regimen due to the potential for dexamethasone to increase INR. Hgb 10.5 and platelets 10 (baseline ~100-120k). No reported bleeding.   Prior to Admission Warfarin Regimen: warfarin 5mg  daily  Goal of Therapy:  INR 2-3 Monitor platelets by anticoagulation protocol: Yes   Plan:  Warfarin 4mg  x1 on 10/15 Monitor daily INR, CBC, and S/S of bleeding   Cristela Felt, PharmD PGY1 Pharmacy Resident [employer]: [phone]  04/13/2019,12:58 PM

## 2019-04-13 NOTE — Progress Notes (Signed)
NAME:  [name], MRN:  [id number], DOB:  Apr 30, 1966, LOS: 4 ADMISSION DATE:  04/09/2019, CONSULTATION DATE:  10/14 REFERRING MD:  Avon Gully, CHIEF COMPLAINT:  dyspnea   CONSULT NOTE Brief History   53 y/o male admitted on 10/11 with dyspnea and hypoxemia from COVID pneumonia.   History of present illness   53 y/o male admitted on 10/11 with dyspnea for one week and hypoxemia.  At baseline he is quite unwell with morbid obesity, PE, CKD, OSA as well as other comorbid illnesses.  He was admitted and found to be profoundly hypoxemic, started on solumedrol and remdesivir.  Over the last two days his oxygen needs have improved and he is feeling better.   Past Medical History  OSA Morbid obesity CKD, baseline Cr around 4 Seizure disorder Obesity hypoventilation syndrome DVT/PE 2012 Bipolar GERD Migraine Chronic respiratory failure with hypoxemia: on 2L [place]  Significant Hospital Events   10/11 admitted 10/13 oxygenation improving  Consults:  PCCM  Procedures:  none  Significant Diagnostic Tests:    Micro Data:  10/11 blood >    Antimicrobials/COVID Rx  Decadron 10/11 >  Redmesivir 10/11 > 10/15  Interim history/subjective:   Feels better Hasn't been out of bed  Objective   Blood pressure (!) 146/81, pulse 81, temperature 98 F (36.7 C), temperature source Oral, resp. rate 18, height 6\' 3"  (1.905 m), weight (!) 209.6 kg, SpO2 92 %.    FiO2 (%):  [87 %] 87 %   Intake/Output Summary (Last 24 hours) at 04/13/2019 0805 Last data filed at 04/13/2019 0600 Gross per 24 hour  Intake 2514.92 ml  Output 3200 ml  Net -685.08 ml   Filed Weights   04/09/19 1215 04/10/19 0045  Weight: (!) 192.8 kg (!) 209.6 kg    Examination:  General:  Morbidly obese, resting comfortably in bed HENT: NCAT OP clear PULM: CTA B, normal effort CV: RRR, no mgr GI: BS+, soft, nontender MSK: normal bulk and tone Neuro: awake, alert, no distress, MAEW   Resolved Hospital  Problem list     Assessment & Plan:  Acute on chronic kidney disease: slowly improving Continue lasix Monitor BMET and UOP Replace electrolytes as needed  Acute on chronic respiratory failure wit hypoxemia due to COVID 19 pneumonia Decadron 10 days remdesivir 5 days Tolerate periods of hypoxemia, goal at rest is greater than 85% SaO2, with movement ideally above 75% Decision for intubation should be based on a change in mental status or physical evidence of ventilatory failure such as nasal flaring, accessory muscle use, paradoxical breathing Out of bed to chair as able Incentive spirometry is important, use every hour Prone positioning while in bed  OK to move to PCU  PCCM to sign off    Best practice:  Diet: regular diet Pain/Anxiety/Delirium protocol (if indicated): n/a VAP protocol (if indicated): n/a DVT prophylaxis: warfarin per pharmacy GI prophylaxis: n/a Glucose control: per [facility] Mobility: up ad lib, PT consult Code Status: full Family Communication: per [facility] Disposition: remain in ICU  Labs   CBC: Recent Labs  Lab 04/09/19 1331 04/10/19 0540 04/11/19 0542 04/12/19 0540  WBC 5.9 3.3* 3.3* 3.4*  NEUTROABS 4.4 2.4 2.5 2.6  HGB 11.2* 10.4* 10.8* 10.3*  HCT 34.8* 32.4* 34.3* 32.4*  MCV 94.3 93.4 94.2 93.1  PLT 104* 91* 112* 103*    Basic Metabolic Panel: Recent Labs  Lab 04/09/19 1331 04/10/19 0540 04/11/19 0542 04/12/19 0540  NA 133* 134* 135 132*  K 3.4* 3.6  3.9 4.1  CL 95* 95* 93* 93*  CO2 24 26 28 28   GLUCOSE 212* 257* 267* 277*  BUN 72* 78* 94* 99*  CREATININE 4.84* 5.13* 4.98* 4.76*  CALCIUM 7.6* 7.9* 8.3* 8.1*  MG 1.8  --   --   --    GFR: Estimated Creatinine Clearance: 34.5 mL/min (A) (by C-G formula based on SCr of 4.76 mg/dL (H)). Recent Labs  Lab 04/09/19 1331 04/09/19 1547 04/10/19 0540 04/11/19 0542 04/12/19 0540  PROCALCITON 1.42  --   --   --   --   WBC 5.9  --  3.3* 3.3* 3.4*  LATICACIDVEN 2.9* 2.0*  --   --   --      Liver Function Tests: Recent Labs  Lab 04/09/19 1331 04/10/19 0540 04/11/19 0542 04/12/19 0540  AST 82* 108* 96* 77*  ALT 28 31 32 31  ALKPHOS 74 75 73 81  BILITOT 0.6 0.8 1.0 1.0  PROT 6.7 6.4* 6.8 6.5  ALBUMIN 2.3* 2.2* 2.3* 2.3*   No results for input(s): LIPASE, AMYLASE in the last 168 hours. No results for input(s): AMMONIA in the last 168 hours.  ABG    Component Value Date/Time   PHART 7.368 04/09/2019 1818   PCO2ART 48.0 04/09/2019 1818   PO2ART 111 (H) 04/09/2019 1818   HCO3 25.9 04/09/2019 1818   TCO2 31.4 05/11/2012 1524   ACIDBASEDEF 6.0 (H) 10/16/2010 0548   O2SAT 97.8 04/09/2019 1818     Coagulation Profile: Recent Labs  Lab 04/09/19 1331 04/10/19 0540 04/11/19 0542 04/12/19 0540  INR 3.8* 3.5* 3.6* 3.0*    Cardiac Enzymes: No results for input(s): CKTOTAL, CKMB, CKMBINDEX, TROPONINI in the last 168 hours.  HbA1C: HB A1C (BAYER DCA - WAIVED)  Date/Time Value Ref Range Status  01/24/2019 12:29 PM 8.8 (H) <7.0 % Final    Comment:                                          Diabetic Adult            <7.0                                       Healthy Adult        4.3 - 5.7                                                           (DCCT/NGSP) American Diabetes Association's Summary of Glycemic Recommendations for Adults with Diabetes: Hemoglobin A1c <7.0%. More stringent glycemic goals (A1c <6.0%) may further reduce complications at the cost of increased risk of hypoglycemia.   10/24/2018 12:34 PM 9.0 (H) <7.0 % Final    Comment:                                          Diabetic Adult            <7.0  Healthy Adult        4.3 - 5.7                                                           (DCCT/NGSP) American Diabetes Association's Summary of Glycemic Recommendations for Adults with Diabetes: Hemoglobin A1c <7.0%. More stringent glycemic goals (A1c <6.0%) may further reduce complications at the cost of  increased risk of hypoglycemia.    Hgb A1c MFr Bld  Date/Time Value Ref Range Status  04/10/2019 05:40 AM 7.9 (H) 4.8 - 5.6 % Final    Comment:    (NOTE) Pre diabetes:          5.7%-6.4% Diabetes:              >6.4% Glycemic control for   <7.0% adults with diabetes     CBG: Recent Labs  Lab 04/12/19 1621 04/12/19 2036 04/12/19 2217 04/13/19 0035 04/13/19 0747  GLUCAP 287* 259* 293* 279* 204*      Critical care time: n/a     Roselie Awkward, MD [place] [facility] Pager: [phone] Cell: [phone] If no response, call [phone]

## 2019-04-13 NOTE — Progress Notes (Signed)
Notified spouse of progress.  All questions were answered and this nurse's contact number shared for further communication.   

## 2019-04-13 NOTE — Progress Notes (Signed)
Called pt's spouse. Updated of pt condition and plan of care. Answered pt's wife's questions. [name] wife appreciative of update.

## 2019-04-13 NOTE — Progress Notes (Addendum)
PROGRESS NOTE    [name]  [id number] DOB: Mar 04, 1966 DOA: 04/09/2019 PCP: Sharion Balloon, FNP   Brief Narrative:  [name] a 53 y.o.WM PMHx: morbid obesity, seizures,diabetes type 2 uncontrolled with complication, depression, BPH, OSA,/OHS chronic respiratory failure on 2-3  L O2 home O2, history of PE and DVT chronic anticoagulation (warfarin).  Patient presented to the ED with complaints of difficulty breathing. Of 2 days duration., Patient's wife tested positive for COVID this past week. Spouse is a Therapist, music at [employer]. In ED patient was noted to be afebrile 98.5. O2 sats initially 86% on nasal cannula, patient was subsequently placed on nonrebreather mask with O2 sats increasing to 87%,he was subsequently switched to BiPAP, with O2 sats maintaining greater than 96%.Portable chest x-ray showed patchy bilateral airspace disease worrisome for pneumonia, including atypical/viral infection. Lactic acid 2.9 >> 2,without fluids. Patient has a DNR form at home, initially refused intubation but later he requestedthat he not beintubated unless he"crashesand burns". Patient spouse was called on the phone, shealso confirmed patient was DNR.EDPtells me he talked to patient in detail about this-he agrees to BiPAP,but he has been told that he cannot be transported with BiPAP.Hewants to be transported with nonrebreather ornasal cannula.Hospitalist to admit for COVID 19pneumonia.   Subjective: 10/15 A/O x4, positive S OB, negative CP, negative abdominal pain, negative N/V.  States had been on a CPAP machine~2 to 3 years ago but was retested and found to no longer have sleep apnea.   Assessment & Plan:   Active Problems:   OSA (obstructive sleep apnea)   Obesity hypoventilation syndrome (HCC)   Pneumonia due to COVID-19 virus   Pressure injury of skin   Chronic respiratory failure with hypoxia (HCC)   History of pulmonary embolus (PE)    Morbid obesity with BMI of 60.0-69.9, adult (HCC)   Seizures (HCC)   Diabetes mellitus type 2, uncontrolled, with complications (HCC)   Acute renal failure superimposed on stage 3b chronic kidney disease (HCC)  Acute on chronic respiratory failure with hypoxia/Covid 19 pneumonia POA -Baseline 2 -3 L O2 nasal cannula, 24 hours a day. -Remdesivir per pharmacy -Decadron 6 mg x 10 days -Titrate O2 to maintain SPO2> 88% COVID-19 Labs  Recent Labs    04/11/19 0542 04/12/19 0540 04/13/19 0615  DDIMER 0.61* 0.48 0.42  FERRITIN 273 181 118  CRP 10.3* 5.6* 3.4*   Lab Results  Component Value Date   SARSCOV2NAA POSITIVE (A) 04/09/2019   OSA/OHS -See respiratory failure -Per patient per in the past had been on CPAP but was discontinued 2- 3 years ago when retested for sleep apnea.  Pulmonary embolus/DVT -On chronic warfarin Recent Labs  Lab 04/09/19 1331 04/10/19 0540 04/11/19 0542 04/12/19 0540 04/13/19 0615  INR 3.8* 3.5* 3.6* 3.0* 2.8*  -Warfarin per pharmacy -10/15 slightly subtherapeutic today, if continues to decrease in the a.m. would give patient extra dose given propensity for clotting in Covid patients.  Acute on CKD stage III (baseline Cr~3.5) -Avoid all nephrotoxic medication -Monitor closely Recent Labs  Lab 04/09/19 1331 04/10/19 0540 04/11/19 0542 04/12/19 0540 04/13/19 0615  CREATININE 4.84* 5.13* 4.98* 4.76* 4.56*  -10/15 Per patient Dr. Vanetta Mulders patient's nephrologist, review of EMR does not show any notes from nephrology.  Will speak with wife to determine when patient last saw nephrologist.  Lactic acidosis  -Resolved  Hx seizure -Stable -Keppra 1000 mg  BID -Lamictal 200 mg  BID -Seizure precautions  Diabetes type 2 uncontrolled with  complication -[id number] hemoglobin A1c= 7.9 -10/15 Lantus 60 units BID -10/15 increase NovoLog 12 units qac  -Resistant SSI  HLD -LDL= 33 -Continue Zocor 20 mg daily  Morbid  obesity -Diet/exercise; per patient minimal physical exertion.  Pivots from bed into motorized wheelchair. -10/14 PT/OT consult placed  Depression/bipolar disorder -Continue home medication buspirone, Cymbalta, Lyrica     DVT prophylaxis: Warfarin per pharmacy Code Status: Full Family Communication: 10/15 spoke with to see his wife) explained plan of care answered all questions Disposition Plan: TBD   Consultants:  PCCM    Procedures/Significant Events:     I have personally reviewed and interpreted all radiology studies and my findings are as above.  VENTILATOR SETTINGS: HFNC Flow rate; 12 L/min SPO2; 94%   Cultures 10/11 blood LEFT antecubital NGTD 10/11 blood LEFT hand NGTD 10/11 MRSA by PCR negative    Antimicrobials: Anti-infectives (From admission, onward)   Start     Stop   04/11/19 1000  remdesivir 100 mg in sodium chloride 0.9 % 250 mL IVPB     04/15/19 0959   04/10/19 0100  remdesivir 200 mg in sodium chloride 0.9 % 250 mL IVPB     04/10/19 0138       Devices    LINES / TUBES:      Continuous Infusions: . sodium chloride 10 mL/hr at 04/12/19 1800  . remdesivir 100 mg in NS 250 mL 100 mg (04/13/19 0831)     Objective: Vitals:   04/13/19 0400 04/13/19 0700 04/13/19 0727 04/13/19 0749  BP: 127/72 (!) 146/81 (!) 146/81   Pulse: 82 79 81   Resp: 13 13 18    Temp: 98.3 F (36.8 C)   98 F (36.7 C)  TempSrc: Axillary   Oral  SpO2: 92% 95% 92%   Weight:      Height:        Intake/Output Summary (Last 24 hours) at 04/13/2019 1144 Last data filed at 04/13/2019 1100 Gross per 24 hour  Intake 1509.93 ml  Output 3000 ml  Net -1490.07 ml   Filed Weights   04/09/19 1215 04/10/19 0045  Weight: (!) 192.8 kg (!) 209.6 kg   Physical Exam:  General: A/O x4, acute on chronic respiratory distress Eyes: negative scleral hemorrhage, negative anisocoria, negative icterus ENT: Negative Runny nose, negative gingival bleeding, Neck:   Negative scars, masses, torticollis, lymphadenopathy, JVD Lungs: Clear to auscultation bilaterally without wheezes or crackles Cardiovascular: Regular rate and rhythm without murmur gallop or rub normal S1 and S2 Abdomen: MORBIDLY OBESE negative abdominal pain, nondistended, positive soft, bowel sounds, no rebound, no ascites, no appreciable mass Extremities: No significant cyanosis, clubbing, or edema bilateral lower extremities Skin: Negative rashes, lesions, ulcers Psychiatric:  Negative depression, negative anxiety, negative fatigue, negative mania  Central nervous system:  Cranial nerves II through XII intact, tongue/uvula midline, all extremities muscle strength 5/5, sensation intact throughout, negative dysarthria, negative expressive aphasia, negative receptive aphasia.  .     Data Reviewed: Care during the described time interval was provided by me .  I have reviewed this patient's available data, including medical history, events of note, physical examination, and all test results as part of my evaluation.   CBC: Recent Labs  Lab 04/09/19 1331 04/10/19 0540 04/11/19 0542 04/12/19 0540 04/13/19 0615  WBC 5.9 3.3* 3.3* 3.4* 4.0  NEUTROABS 4.4 2.4 2.5 2.6 3.0  HGB 11.2* 10.4* 10.8* 10.3* 10.5*  HCT 34.8* 32.4* 34.3* 32.4* 32.4*  MCV 94.3 93.4 94.2 93.1 92.8  PLT  104* 91* 112* 103* 0000000*   Basic Metabolic Panel: Recent Labs  Lab 04/09/19 1331 04/10/19 0540 04/11/19 0542 04/12/19 0540 04/13/19 0615  NA 133* 134* 135 132* 134*  K 3.4* 3.6 3.9 4.1 PENDING  CL 95* 95* 93* 93* 94*  CO2 24 26 28 28 26   GLUCOSE 212* 257* 267* 277* 244*  BUN 72* 78* 94* 99* PENDING  CREATININE 4.84* 5.13* 4.98* 4.76* 4.56*  CALCIUM 7.6* 7.9* 8.3* 8.1* 8.3*  MG 1.8  --   --   --   --    GFR: Estimated Creatinine Clearance: 36.1 mL/min (A) (by C-G formula based on SCr of 4.56 mg/dL (H)). Liver Function Tests: Recent Labs  Lab 04/09/19 1331 04/10/19 0540 04/11/19 0542 04/12/19 0540  04/13/19 0615  AST 82* 108* 96* 77* 63*  ALT 28 31 32 31 31  ALKPHOS 74 75 73 81 79  BILITOT 0.6 0.8 1.0 1.0 0.6  PROT 6.7 6.4* 6.8 6.5 6.5  ALBUMIN 2.3* 2.2* 2.3* 2.3* 2.3*   No results for input(s): LIPASE, AMYLASE in the last 168 hours. No results for input(s): AMMONIA in the last 168 hours. Coagulation Profile: Recent Labs  Lab 04/09/19 1331 04/10/19 0540 04/11/19 0542 04/12/19 0540 04/13/19 0615  INR 3.8* 3.5* 3.6* 3.0* 2.8*   Cardiac Enzymes: No results for input(s): CKTOTAL, CKMB, CKMBINDEX, TROPONINI in the last 168 hours. BNP (last 3 results) No results for input(s): PROBNP in the last 8760 hours. HbA1C: No results for input(s): HGBA1C in the last 72 hours. CBG: Recent Labs  Lab 04/12/19 1621 04/12/19 2036 04/12/19 2217 04/13/19 0035 04/13/19 0747  GLUCAP 287* 259* 293* 279* 204*   Lipid Profile: Recent Labs    04/13/19 0615  CHOL 81  HDL 16*  LDLCALC 33  TRIG 161*  CHOLHDL 5.1   Thyroid Function Tests: No results for input(s): TSH, T4TOTAL, FREET4, T3FREE, THYROIDAB in the last 72 hours. Anemia Panel: Recent Labs    04/12/19 0540 04/13/19 0615  FERRITIN 181 118   Urine analysis:    Component Value Date/Time   COLORURINE YELLOW 01/26/2019 1834   APPEARANCEUR CLEAR 01/26/2019 1834   APPEARANCEUR Clear 10/24/2018 1235   LABSPEC 1.010 01/26/2019 1834   PHURINE 6.0 01/26/2019 1834   GLUCOSEU >=500 (A) 01/26/2019 1834   HGBUR LARGE (A) 01/26/2019 1834   BILIRUBINUR NEGATIVE 01/26/2019 1834   BILIRUBINUR Negative 10/24/2018 [place] 01/26/2019 1834   PROTEINUR 100 (A) 01/26/2019 1834   UROBILINOGEN negative 02/22/2015 1427   UROBILINOGEN 0.2 10/17/2010 0951   NITRITE NEGATIVE 01/26/2019 1834   LEUKOCYTESUR NEGATIVE 01/26/2019 1834   Sepsis Labs: @LABRCNTIP (procalcitonin:4,lacticidven:4)  ) Recent Results (from the past 240 hour(s))  SARS Coronavirus 2 by RT PCR (hospital order, performed in [place] hospital lab)  Nasopharyngeal Nasopharyngeal Swab     Status: Abnormal   Collection Time: 04/09/19 12:47 PM   Specimen: Nasopharyngeal Swab  Result Value Ref Range Status   SARS Coronavirus 2 POSITIVE (A) NEGATIVE Final    Comment: RESULT CALLED TO, READ BACK BY AND VERIFIED WITH:  A. TUTTLE,RN  @1341   10/11/2020KAY (NOTE) If result is NEGATIVE SARS-CoV-2 target nucleic acids are NOT DETECTED. The SARS-CoV-2 RNA is generally detectable in upper and lower  respiratory specimens during the acute phase of infection. The lowest  concentration of SARS-CoV-2 viral copies this assay can detect is 250  copies / mL. A negative result does not preclude SARS-CoV-2 infection  and should not be used as the sole basis  for treatment or other  patient management decisions.  A negative result may occur with  improper specimen collection / handling, submission of specimen other  than nasopharyngeal swab, presence of viral mutation(s) within the  areas targeted by this assay, and inadequate number of viral copies  (<250 copies / mL). A negative result must be combined with clinical  observations, patient history, and epidemiological information. If result is POSITIVE SARS-CoV-2 target nucleic acids are DETECTED. T he SARS-CoV-2 RNA is generally detectable in upper and lower  respiratory specimens during the acute phase of infection.  Positive  results are indicative of active infection with SARS-CoV-2.  Clinical  correlation with patient history and other diagnostic information is  necessary to determine patient infection status.  Positive results do  not rule out bacterial infection or co-infection with other viruses. If result is PRESUMPTIVE POSTIVE SARS-CoV-2 nucleic acids MAY BE PRESENT.   A presumptive positive result was obtained on the submitted specimen  and confirmed on repeat testing.  While 2019 novel coronavirus  (SARS-CoV-2) nucleic acids may be present in the submitted sample  additional confirmatory  testing may be necessary for epidemiological  and / or clinical management purposes  to differentiate between  SARS-CoV-2 and other Sarbecovirus currently known to infect humans.  If clinically indicated additional testing with an alternate test  methodology [phone]) is  advised. The SARS-CoV-2 RNA is generally  detectable in upper and lower respiratory specimens during the acute  phase of infection. The expected result is Negative. Fact Sheet for Patients:  StrictlyIdeas.no Fact Sheet for Healthcare Providers: BankingDealers.co.za This test is not yet approved or cleared by the [place] FDA and has been authorized for detection and/or diagnosis of SARS-CoV-2 by FDA under an Emergency Use Authorization (EUA).  This EUA will remain in effect (meaning this test can be used) for the duration of the COVID-19 declaration under Section 564(b)(1) of the Act, 21 U.S.C. section 360bbb-3(b)(1), unless the authorization is terminated or revoked sooner. Performed at [facility], [address], [place], [place] [address]   Blood Culture (routine x 2)     Status: None (Preliminary result)   Collection Time: 04/09/19  2:25 PM   Specimen: Left Antecubital; Blood  Result Value Ref Range Status   Specimen Description   Final    LEFT ANTECUBITAL BOTTLES DRAWN AEROBIC AND ANAEROBIC   Special Requests Blood Culture adequate volume  Final   Culture   Final    NO GROWTH 4 DAYS Performed at [facility], [address] [place], [place] [address]    Report Status PENDING  Incomplete  Blood Culture (routine x 2)     Status: None (Preliminary result)   Collection Time: 04/09/19  2:26 PM   Specimen: BLOOD LEFT HAND  Result Value Ref Range Status   Specimen Description   Final    BLOOD LEFT HAND BOTTLES DRAWN AEROBIC AND ANAEROBIC   Special Requests Blood Culture adequate volume  Final   Culture   Final    NO GROWTH 4 DAYS Performed at [facility], [address], [place], [place] [address]    Report Status PENDING  Incomplete  MRSA PCR Screening     Status: None   Collection Time: 04/09/19 11:55 PM   Specimen: Nasopharyngeal  Result Value Ref Range Status   MRSA by PCR NEGATIVE NEGATIVE Final    Comment:        The GeneXpert MRSA Assay (FDA approved for NASAL specimens only), is one component of  a comprehensive MRSA colonization surveillance program. It is not intended to diagnose MRSA infection nor to guide or monitor treatment for MRSA infections. Performed at [facility], [place] [address] [place], [place] [address]          Radiology Studies: No results found.      Scheduled Meds: . busPIRone  15 mg Oral BID  . Chlorhexidine Gluconate Cloth  6 each Topical Daily  . dexamethasone (DECADRON) injection  6 mg Intravenous Q24H  . dextromethorphan-guaiFENesin  1 tablet Oral BID  . DULoxetine  60 mg Oral Daily  . fentaNYL  1 patch Transdermal Q72H  . furosemide  80 mg Oral BID  . insulin aspart  0-20 Units Subcutaneous TID WC  . insulin aspart  0-5 Units Subcutaneous QHS  . insulin aspart  7 Units Subcutaneous TID WC  . insulin glargine  60 Units Subcutaneous BID  . lamoTRIgine  200 mg Oral BID  . levETIRAcetam  1,000 mg Oral BID  . loratadine  10 mg Oral Daily  . pantoprazole  40 mg Oral Daily  . potassium chloride  40 mEq Oral Daily  . pregabalin  50 mg Oral TID  . simvastatin  20 mg Oral QHS  . tamsulosin  0.4 mg Oral QPC supper  . Warfarin - Pharmacist Dosing Inpatient   Does not apply q1800   Continuous Infusions: . sodium chloride 10 mL/hr at 04/12/19 1800  . remdesivir 100 mg in NS 250 mL 100 mg (04/13/19 0831)     LOS: 4 days   The patient is critically ill with multiple organ systems failure and requires high complexity decision making for assessment and support, frequent evaluation and titration of therapies, application of advanced monitoring technologies and extensive  interpretation of multiple databases. Critical Care Time devoted to patient care services described in this note  Time spent: 40 minutes     WOODS, Geraldo Docker, MD [facility] Pager [phone]  If 7PM-7AM, please contact night-coverage [email or web address] Password [email or web address] 04/13/2019, 11:44 AM

## 2019-04-14 DIAGNOSIS — R0603 Acute respiratory distress: Secondary | ICD-10-CM

## 2019-04-14 DIAGNOSIS — U071 COVID-19: Secondary | ICD-10-CM | POA: Diagnosis not present

## 2019-04-14 DIAGNOSIS — R0902 Hypoxemia: Secondary | ICD-10-CM | POA: Diagnosis not present

## 2019-04-14 DIAGNOSIS — N179 Acute kidney failure, unspecified: Secondary | ICD-10-CM | POA: Diagnosis not present

## 2019-04-14 LAB — CBC WITH DIFFERENTIAL/PLATELET
Abs Immature Granulocytes: 0.08 10*3/uL — ABNORMAL HIGH (ref 0.00–0.07)
Basophils Absolute: 0 10*3/uL (ref 0.0–0.1)
Basophils Relative: 0 %
Eosinophils Absolute: 0 10*3/uL (ref 0.0–0.5)
Eosinophils Relative: 0 %
HCT: 34.5 % — ABNORMAL LOW (ref 39.0–52.0)
Hemoglobin: 11.3 g/dL — ABNORMAL LOW (ref 13.0–17.0)
Immature Granulocytes: 2 %
Lymphocytes Relative: 15 %
Lymphs Abs: 0.6 10*3/uL — ABNORMAL LOW (ref 0.7–4.0)
MCH: 30.9 pg (ref 26.0–34.0)
MCHC: 32.8 g/dL (ref 30.0–36.0)
MCV: 94.3 fL (ref 80.0–100.0)
Monocytes Absolute: 0.3 10*3/uL (ref 0.1–1.0)
Monocytes Relative: 8 %
Neutro Abs: 2.9 10*3/uL (ref 1.7–7.7)
Neutrophils Relative %: 75 %
Platelets: 93 10*3/uL — ABNORMAL LOW (ref 150–400)
RBC: 3.66 MIL/uL — ABNORMAL LOW (ref 4.22–5.81)
RDW: 13.9 % (ref 11.5–15.5)
WBC: 3.8 10*3/uL — ABNORMAL LOW (ref 4.0–10.5)
nRBC: 0 % (ref 0.0–0.2)

## 2019-04-14 LAB — CULTURE, BLOOD (ROUTINE X 2)
Culture: NO GROWTH
Culture: NO GROWTH
Special Requests: ADEQUATE
Special Requests: ADEQUATE

## 2019-04-14 LAB — COMPREHENSIVE METABOLIC PANEL
ALT: 31 U/L (ref 0–44)
AST: 57 U/L — ABNORMAL HIGH (ref 15–41)
Albumin: 2.3 g/dL — ABNORMAL LOW (ref 3.5–5.0)
Alkaline Phosphatase: 75 U/L (ref 38–126)
Anion gap: 11 (ref 5–15)
BUN: 113 mg/dL — ABNORMAL HIGH (ref 6–20)
CO2: 29 mmol/L (ref 22–32)
Calcium: 8.4 mg/dL — ABNORMAL LOW (ref 8.9–10.3)
Chloride: 100 mmol/L (ref 98–111)
Creatinine, Ser: 4.3 mg/dL — ABNORMAL HIGH (ref 0.61–1.24)
GFR calc Af Amer: 17 mL/min — ABNORMAL LOW (ref >60)
GFR calc non Af Amer: 15 mL/min — ABNORMAL LOW (ref >60)
Glucose, Bld: 144 mg/dL — ABNORMAL HIGH (ref 70–99)
Potassium: 3.9 mmol/L (ref 3.5–5.1)
Sodium: 140 mmol/L (ref 135–145)
Total Bilirubin: 0.7 mg/dL (ref 0.3–1.2)
Total Protein: 6.5 g/dL (ref 6.5–8.1)

## 2019-04-14 LAB — GLUCOSE, CAPILLARY
Glucose-Capillary: 111 mg/dL — ABNORMAL HIGH (ref 70–99)
Glucose-Capillary: 179 mg/dL — ABNORMAL HIGH (ref 70–99)
Glucose-Capillary: 125 mg/dL — ABNORMAL HIGH (ref 70–99)

## 2019-04-14 LAB — C-REACTIVE PROTEIN: CRP: 4.1 mg/dL — ABNORMAL HIGH (ref <1.0)

## 2019-04-14 LAB — D-DIMER, QUANTITATIVE: D-Dimer, Quant: 0.4 ug/mL-FEU (ref 0.00–0.50)

## 2019-04-14 LAB — FERRITIN: Ferritin: 195 ng/mL (ref 24–336)

## 2019-04-14 LAB — PROTIME-INR
INR: 2.8 — ABNORMAL HIGH (ref 0.8–1.2)
Prothrombin Time: 28.7 seconds — ABNORMAL HIGH (ref 11.4–15.2)

## 2019-04-14 MED ORDER — DEXAMETHASONE SODIUM PHOSPHATE 4 MG/ML IJ SOLN
4.0000 mg | INTRAMUSCULAR | Status: DC
  Administered 2019-04-14 – 2019-04-15 (×2): 4 mg via INTRAVENOUS
  Filled 2019-04-14 (×2): qty 1

## 2019-04-14 MED ORDER — WARFARIN SODIUM 4 MG PO TABS
4.0000 mg | ORAL_TABLET | Freq: Once | ORAL | Status: AC
  Administered 2019-04-14: 17:00:00 4 mg via ORAL
  Filled 2019-04-14: qty 1

## 2019-04-14 MED ORDER — RESOURCE THICKENUP CLEAR PO POWD
ORAL | Status: DC | PRN
  Filled 2019-04-14: qty 125

## 2019-04-14 NOTE — Progress Notes (Addendum)
"  Hey doc. I think speech should do an eval on pt. He is coughing after taking sips of water and meds. He has done it multiple times. I asked him if it feels like he's choking on it and he says no. He doesn't do it every time but I know he is at risk for aspiration. Just want to be extra careful with him. What do you think"?  This message sent to Candiss Norse, MD. Orders to call speech and place pt on soft diet with honey thick liquids received. Will continue to monitor pt

## 2019-04-14 NOTE — Progress Notes (Signed)
ANTICOAGULATION CONSULT NOTE - Follow Up Consult  Pharmacy Consult for warfarin Indication: history of pulmonary embolus and DVT  Allergies  Allergen Reactions  . Bee Venom Anaphylaxis, Shortness Of Breath and Swelling  . Other Shortness Of Breath    Itching, rash with IVP DYE, iodine, shellfish LATEX  . Penicillins Anaphylaxis and Shortness Of Breath    Has patient had a PCN reaction causing immediate rash, facial/tongue/throat swelling, SOB or lightheadedness with hypotension: Yes Has patient had a PCN reaction causing severe rash involving mucus membranes or skin necrosis: No Has patient had a PCN reaction that required hospitalization No Has patient had a PCN reaction occurring within the last 10 years: No If all of the above answers are "NO", then may proceed with Cephalosporin use.   . Shellfish Allergy Nausea And Vomiting and Other (See Comments)    Feels like insides are twisting  . Iodinated Diagnostic Agents     Other reaction(s): RASH  . Iohexol      Code: RASH, Desc: PT WAS ON PREDNISONE FOR GOUT TX. @ TIME OF SCAN AND RECEIVED 50 MG OF BENADRYL IV-ARS 10/08/07   . Iodine Rash  . Latex Rash    Patient Measurements: Height: 6\' 3"  (190.5 cm) Weight: (!) 462 lb (209.6 kg) IBW/kg (Calculated) : 84.5   Vital Signs: Temp: 98.2 F (36.8 C) (10/16 1100) Temp Source: Oral (10/16 1100) BP: 115/97 (10/16 1100) Pulse Rate: 83 (10/16 0645)  Labs: Recent Labs    04/12/19 0540 04/13/19 0615 04/14/19 0343  HGB 10.3* 10.5* 11.3*  HCT 32.4* 32.4* 34.5*  PLT 103* 109* 93*  LABPROT 30.3* 29.0* 28.7*  INR 3.0* 2.8* 2.8*  CREATININE 4.76* 4.56* 4.30*    Estimated Creatinine Clearance: 38.2 mL/min (A) (by C-G formula based on SCr of 4.3 mg/dL (H)).   Medical History: Past Medical History:  Diagnosis Date  . Anemia   . Anxiety   . Arthritis   . Bipolar 1 disorder ([place])   . Bronchitis    hx of  . Bruises easily   . Chronic kidney disease    decreased left  kidney fx  . Chronic pain syndrome 05/11/2012  . Chronic respiratory failure with hypoxia (H[facility])    And with hypercapnia  . Diabetes mellitus without complication ([place])   . Diabetic neuropathy ([place])   . DVT (deep venous thrombosis) (H[facility])    LLE DVT ~ '12  . Dyspnea    with ambulation  . GERD (gastroesophageal reflux disease)   . HOH (hard of hearing) 2015   has hearing aids  . Mental disorder   . Migraine   . Neuromuscular disorder ([place])   . Obesity hypoventilation syndrome ([place])   . Obstructive sleep apnea    CPAP  . PE (pulmonary embolism)    bilateral PE ~ '11  . Seizures ([place])   . Thrombocytopenia ([place]) 05/11/2012    Medications:  Scheduled:  . busPIRone  15 mg Oral BID  . Chlorhexidine Gluconate Cloth  6 each Topical Daily  . dexamethasone (DECADRON) injection  4 mg Intravenous Q24H  . dextromethorphan-guaiFENesin  1 tablet Oral BID  . DULoxetine  60 mg Oral Daily  . fentaNYL  1 patch Transdermal Q72H  . furosemide  80 mg Oral BID  . insulin aspart  0-20 Units Subcutaneous TID WC  . insulin aspart  0-5 Units Subcutaneous QHS  . insulin aspart  12 Units Subcutaneous TID WC  . insulin glargine  60 Units Subcutaneous BID  . lamoTRIgine  200 mg Oral BID  . levETIRAcetam  1,000 mg Oral BID  . loratadine  10 mg Oral Daily  . pantoprazole  40 mg Oral Daily  . potassium chloride  40 mEq Oral Daily  . pregabalin  50 mg Oral TID  . simvastatin  20 mg Oral QHS  . tamsulosin  0.4 mg Oral QPC supper  . Warfarin - Pharmacist Dosing Inpatient   Does not apply q1800    Assessment: 53 yo male admitted on 04/09/2019 for SOB and found to be positive for COVID-19. Pharmacy is consulted to dose warfarin for history of DVT and PE. Patient is on warfarin prior to admission. On admission, INR was supratherapeutic and warfarin was been held for three days (10/11-10/13). Warfarin was resumed on 10/14 when INR was therapeutic. Today, INR of 2.8 is therapeutic. Will dose warfarin slightly  lower than home regimen due to the potential for dexamethasone to increase INR. Hgb 11.3 and platelets 93 (baseline ~100-120k).   Prior to Admission Warfarin Regimen: warfarin 5mg  daily  Goal of Therapy:  INR 2-3 Monitor platelets by anticoagulation protocol: Yes   Plan:  Warfarin 4mg  x1 on 10/16 Monitor daily INR, CBC, and S/S of bleeding   Albertina Parr, PharmD., BCPS Clinical Pharmacist Clinical phone for 04/14/19 until 5pm: [phone]

## 2019-04-14 NOTE — Progress Notes (Signed)
Wife on phone with pt while I was in the room, wife updated

## 2019-04-14 NOTE — Progress Notes (Signed)
Occupational Therapy Evaluation Patient Details Name: [name] MRN: [id number] DOB: 1966-01-06 Today's Date: 04/14/2019    History of Present Illness 53 year old super morbidly obese Caucasian male with history of seizures, DM type II, depression, BPH, OSA, OHS on 2 to 3 L nasal cannula oxygen at home.  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  He was admitted to ICU   Clinical Impression   PTA, pt primarily used wc for mobility but was able to transfer himself  With S of his wife, who works during the day. Pt states he can do his ADL tasks but wife assists as needed. Seen on 10L O2 with pt requesting to add NRB during activity. Attempted to stand but pt unable. Pt anxious; SpO2 in 90s.  Max to total A with LB ADL. Max A +2 with bed mobility. Pt would benefit from rehab at SNF however pt states that his wife "will not let that happen". If DC home is plan he will need a bariatric hoyer lift and hospital bed and HHOT and [place]. Will follow acutely.  Pt educated on importance of using incentive spirometer and mobilizing OOB daily in addition to completing theraband exercises.     Follow Up Recommendations  Supervision/Assistance - 24 hour;SNF;Home health OT    Equipment Recommendations  Other (comment);Hospital bed(Bariatric Hoyer lift)    Recommendations for Other Services       Precautions / Restrictions Precautions Precautions: Fall Precaution Comments: monitor Sats, needed NRB for attempted mobility on eval, needs Tenor lift      Mobility Bed Mobility Overal bed mobility: Needs Assistance Bed Mobility: Rolling Rolling: Max assist;+2 for physical assistance;+2 for safety/equipment            Transfers Overall transfer level: Needs assistance Equipment used: Rolling walker (2 wheeled) Transfers: Sit to/from Stand Sit to Stand: +2  safety/equipment;+2 physical assistance;Total assist         General transfer comment: attempted to stand at [facility] then turned the recliner to have pt. pull up on bed rail. Patient unable to pull self to standing    Balance Overall balance assessment: Needs assistance Sitting-balance support: Feet supported;No upper extremity supported Sitting balance-Leahy Scale: Good Sitting balance - Comments: sits in recliner                                   ADL either performed or assessed with clinical judgement   ADL Overall ADL's : Needs assistance/impaired     Grooming: Set up;Sitting   Upper Body Bathing: Minimal assistance;Sitting   Lower Body Bathing: Maximal assistance;Bed level   Upper Body Dressing : Minimal assistance   Lower Body Dressing: Bed level;Total assistance     Toilet Transfer Details (indicate cue type and reason): unable to safely attempt Toileting- Clothing Manipulation and Hygiene: Total assistance       Functional mobility during ADLs: (+3 A with Maximove)       Vision         Perception     Praxis      Pertinent Vitals/Pain Pain Assessment: Faces Faces Pain Scale: Hurts even more Pain Location: legs, abdoment when in lift Pain Descriptors / Indicators: Discomfort Pain Intervention(s): Limited activity within patient's tolerance     Hand Dominance Right   Extremity/Trunk Assessment Upper Extremity Assessment Upper  Extremity Assessment: Generalized weakness   Lower Extremity Assessment Lower Extremity Assessment: Defer to PT evaluation   Cervical / Trunk Assessment Cervical / Trunk Assessment: Other exceptions Cervical / Trunk Exceptions: grossly able to sit up forward in recliner; increased body habitus   Communication     Cognition Arousal/Alertness: Awake/alert Behavior During Therapy: WFL for tasks assessed/performed; anxious Overall Cognitive Status: Within Functional Limits for tasks assessed                                      General Comments       Exercises Exercises: Other exercises Other Exercises Other Exercises: incentive spirometer x 10. able to pull 251ml   Shoulder Instructions      Home Living Family/patient expects to be discharged to:: Private residence Living Arrangements: Spouse/significant other Available Help at Discharge: Family Type of Home: House Home Access: Ramped entrance     Home Layout: One level     Bathroom Shower/Tub: Other (comment)(handicapped accessible tub with door)   Bathroom Toilet: Standard Bathroom Accessibility: Yes How Accessible: Accessible via wheelchair Home Equipment: Walker - 2 wheels;Adaptive equipment;Wheelchair - power [employer] Equipment: Reacher        Prior Functioning/Environment Level of Independence: Needs assistance  Gait / Transfers Assistance Needed: transfers only ADL's / Homemaking Assistance Needed: spouse assits with bath however pt states he can do it on his own   Comments: unsure how much spouse assists with ADL        OT Problem List: Decreased strength;Decreased range of motion;Decreased activity tolerance;Impaired balance (sitting and/or standing);Decreased safety awareness;Decreased knowledge of use of DME or AE;Cardiopulmonary status limiting activity;Obesity;Pain      OT Treatment/Interventions: Self-care/ADL training;Therapeutic exercise;Neuromuscular education;Energy conservation;DME and/or AE instruction;Therapeutic activities    OT Goals(Current goals can be found in the care plan section) Acute Rehab OT Goals Patient Stated Goal: to go home OT Goal Formulation: With patient Time For Goal Achievement: 04/28/19 Potential to Achieve Goals: Good  OT Frequency: Min 3X/week   Barriers to D/C:            Co-evaluation PT/OT/SLP Co-Evaluation/Treatment: Yes Reason for Co-Treatment: Complexity of the patient's impairments (multi-system involvement);For patient/therapist safety;To  address functional/ADL transfers PT goals addressed during session: Mobility/safety with mobility OT goals addressed during session: ADL's and self-care      AM-PAC OT "6 Clicks" Daily Activity     Outcome Measure Help from another person eating meals?: None Help from another person taking care of personal grooming?: A Little Help from another person toileting, which includes using toliet, bedpan, or urinal?: Total Help from another person bathing (including washing, rinsing, drying)?: A Lot Help from another person to put on and taking off regular upper body clothing?: A Lot Help from another person to put on and taking off regular lower body clothing?: Total 6 Click Score: 13   End of Session Equipment Utilized During Treatment: Oxygen(10L; NRB added @ 15 L when attempting mobility) Nurse Communication: Mobility status;Need for lift equipment(Use Tenor)  Activity Tolerance: Patient tolerated treatment well Patient left: in bed;with call bell/phone within reach  OT Visit Diagnosis: Other abnormalities of gait and mobility (R26.89);Muscle weakness (generalized) (M62.81);Pain Pain - part of body: (general discomfort with lift; complaints of back pain)                Time: [id number] OT Time Calculation (min): 49 min Charges:  OT General Charges $OT Visit:  1 Visit OT Evaluation $OT Eval High Complexity: 1 High  [place], OT/L   Acute OT Clinical Specialist [place] Pager [phone] Office [phone]   [facility] 04/14/2019, 2:30 PM

## 2019-04-14 NOTE — Evaluation (Signed)
Physical Therapy Evaluation Patient Details Name: [name] MRN: [id number] DOB: September 19, 1965 Today's Date: 04/14/2019   History of Present Illness  53 year old super morbidly obese Caucasian male with history of seizures, DM type II, depression, BPH, OSA, OHS on 2 to 3 L nasal cannula oxygen at home.  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  He was admitted to ICU  Clinical Impression  The patient received in recliner, OOB by nursing via maximove. Patient attempted to stand x 2 from recliner but unable. Patient on 10 L HFNC with SPO2 90's, patient very anxious and asking for NRB mask for activity. Patient placed on NRB/15L to attempt standing . Replaced to 10 L HFNC afterwards.  Assisted pt back to bed via maximove which was very difficult due to body habitus.  Recommend use of Tenor lift for higher weight transfers.  At baseline, pt, transfers self from [facility] to other seated surface..   currently, patient is very compromised for  Mobility due to not being in his environment with his DME.  Pt admitted with above diagnosis.  Pt currently with functional limitations due to the deficits listed below (see PT Problem List). Pt will benefit from skilled PT to increase their independence and safety with mobility to allow discharge to the venue listed below.       Follow Up Recommendations Home health PT    Equipment Recommendations  None recommended by PT    Recommendations for Other Services       Precautions / Restrictions Precautions Precautions: Fall Precaution Comments: monitor Sats, needed NRB for attempted mobility on eval, needs Tenor lift      Mobility  Bed Mobility Overal bed mobility: Needs Assistance Bed Mobility: Rolling Rolling: Max assist;+2 for physical assistance;+2 for safety/equipment            Transfers Overall transfer level:  Needs assistance Equipment used: Rolling walker (2 wheeled) Transfers: Sit to/from Stand Sit to Stand: +2 safety/equipment;+2 physical assistance;Total assist         General transfer comment: attempted to stand at [facility] then turned the recliner to have pt. pull up on bed rail. Patient unable to pull self to standing  Ambulation/Gait             General Gait Details: NA  Stairs            Wheelchair Mobility    Modified Rankin (Stroke Patients Only)       Balance Overall balance assessment: Needs assistance Sitting-balance support: Feet supported;No upper extremity supported Sitting balance-Leahy Scale: Good Sitting balance - Comments: sits in recliner                                     Pertinent Vitals/Pain Pain Assessment: Faces Faces Pain Scale: Hurts even more Pain Location: legs, abdoment when in lift Pain Descriptors / Indicators: Discomfort Pain Intervention(s): Monitored during session;Repositioned    Home Living Family/patient expects to be discharged to:: Private residence Living Arrangements: Spouse/significant other Available Help at Discharge: Family Type of Home: House Home Access: Ramped entrance     Home Layout: One level Home Equipment: Environmental consultant - 2 wheels;Adaptive equipment;Wheelchair - power      Prior Function Level of Independence: Needs assistance   Gait / Transfers Assistance Needed: transfers only, per pt, performed independently  ADL's / Homemaking Assistance Needed: spouse assits with bath        Hand Dominance        Extremity/Trunk Assessment        Lower Extremity Assessment Lower Extremity Assessment: Generalized weakness    Cervical / Trunk Assessment Cervical / Trunk Assessment: Other exceptions Cervical / Trunk Exceptions: grossly able to sit up forward in recliner  Communication      Cognition Arousal/Alertness: Awake/alert Behavior During Therapy: WFL for tasks  assessed/performed Overall Cognitive Status: Within Functional Limits for tasks assessed                                        General Comments      Exercises     Assessment/Plan    PT Assessment Patient needs continued PT services  PT Problem List Decreased strength;Decreased mobility;Obesity;Decreased activity tolerance;Cardiopulmonary status limiting activity;Decreased skin integrity       PT Treatment Interventions Therapeutic activities;Therapeutic exercise;Patient/family education;Functional mobility training    PT Goals (Current goals can be found in the Care Plan section)  Acute Rehab PT Goals Patient Stated Goal: to go home PT Goal Formulation: With patient Time For Goal Achievement: 04/28/19 Potential to Achieve Goals: Fair    Frequency Min 3X/week   Barriers to discharge Decreased caregiver support wife works, home alone,    Co-evaluation PT/OT/SLP Co-Evaluation/Treatment: Yes Reason for Co-Treatment: Complexity of the patient's impairments (multi-system involvement);For patient/therapist safety;To address functional/ADL transfers PT goals addressed during session: Mobility/safety with mobility OT goals addressed during session: ADL's and self-care       AM-PAC PT "6 Clicks" Mobility  Outcome Measure Help needed turning from your back to your side while in a flat bed without using bedrails?: Total Help needed moving from lying on your back to sitting on the side of a flat bed without using bedrails?: Total Help needed moving to and from a bed to a chair (including a wheelchair)?: Total Help needed standing up from a chair using your arms (e.g., wheelchair or bedside chair)?: Total Help needed to walk in hospital room?: Total Help needed climbing 3-5 steps with a railing? : Total 6 Click Score: 6    End of Session Equipment Utilized During Treatment: Oxygen Activity Tolerance: Treatment limited secondary to medical complications  (Comment) Patient left: in bed;with call bell/phone within reach Nurse Communication: Mobility status;Need for lift equipment(Tenor lift) PT Visit Diagnosis: Muscle weakness (generalized) (M62.81)    Time: [id number] PT Time Calculation (min) (ACUTE ONLY): 44 min   Charges:   PT Evaluation $PT Eval Moderate Complexity: [place] Pager [phone] Office [phone]    Claretha Cooper 04/14/2019, 1:54 PM

## 2019-04-14 NOTE — Progress Notes (Signed)
PROGRESS NOTE                                                                                                                                                                                                             Patient Demographics:    [name], is a 53 y.o. male, DOB - 1966/06/03, YDX:[id number]  Outpatient Primary MD for the patient is Sharion Balloon, FNP    LOS - 5  Admit date - 04/09/2019    Chief Complaint  Patient presents with  . Respiratory Distress       Brief Narrative -  53 year old super morbidly obese Caucasian male with history of seizures, DM type II, depression, BPH, OSA, OHS on 2 to 3 L nasal cannula oxygen at home.  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  He was admitted to ICU stabilized and transferred to hospitalist service on 04/14/2019.   Subjective:    [name] today has, No headache, No chest pain, No abdominal pain - No Nausea, No new weakness tingling or numbness, no cough.  Mild shortness of breath.   Assessment  & Plan :     1. Acute on chronic hypoxic Resp. Failure due to Acute Covid 19 Viral Pneumonitis during the ongoing 2020 Covid 19 Pandemic - he has baseline underlying obesity related hypoventilation syndrome and uses about 3 L nasal cannula at all times.  Treated this admission in ICU on IV Remdisvir and Decadron, currently on 8 to 10 L nasal cannula oxygen.  When he moves he requires nonrebreather mask.  Also is developing atelectasis from his bedbound status and not moving out of bed compounded by his super morbid obesity.  Encouraged to sit up in chair in the daytime and use I-S and flutter valve for pulmonary toiletry.  Continue monitoring clinically.   COVID-19 Labs  Recent Labs    04/12/19 0540 04/13/19 0615 04/14/19 0343   DDIMER 0.48 0.42 0.40  FERRITIN 181 118 195  CRP 5.6* 3.4* 4.1*    Lab Results  Component Value Date   SARSCOV2NAA POSITIVE (A) 04/09/2019    SpO2: 97 % O2 Flow Rate (L/min): 12 L/min FiO2 (%): 87 %  Hepatic Function Latest Ref Rng & Units 04/14/2019  04/13/2019 04/12/2019  Total Protein 6.5 - 8.1 g/dL 6.5 6.5 6.5  Albumin 3.5 - 5.0 g/dL 2.3(L) 2.3(L) 2.3(L)  AST 15 - 41 U/L 57(H) 63(H) 77(H)  ALT 0 - 44 U/L 31 31 31   Alk Phosphatase 38 - 126 U/L 75 79 81  Total Bilirubin 0.3 - 1.2 mg/dL 0.7 0.6 1.0  Bilirubin, Direct 0.0 - 0.3 mg/dL - - -        Component Value Date/Time   BNP 27.0 07/13/2017 1329      2.  OSA/OHS - currently not on CPAP at home.  3.  History of PE and DVT.  On Coumadin INR therapeutic.  Pharmacy monitoring.  4. Hx seizure - Stable, continue combination of Keppra and Lamictal.   5.  Dyslipidemia.  On Zocor.  6.  Morbid obesity BMI of 57.  Outpatient follow-up PCP for weight loss.  7.  Deconditioning.  PT OT consulted.  Sit to sit up in chair and daytime.  8.  Depression and bipolar disorder.  Continue buspirone, Cymbalta and Lyrica combination.  9.  AKI on CKD 4.  Baseline creatinine 3.5.  Continue supportive care.  Monitor.  Initially had mild AKI due to ATN.  Will monitor closely.  Currently on Lasix 80 twice daily.  Renal function improving.  10. DM type II.  On Lantus and sliding scale combination.  Outpatient glycemic control is slightly poor due to hyperglycemia.  A1c was 7.9  CBG (last 3)  Recent Labs    04/13/19 1654 04/13/19 2041 04/14/19 0750  GLUCAP 204* 135* 111*     Condition -  Guarded  Family Communication  :   Updated wife [name] on 04/14/2019.  She tells me that patient is virtually bedbound and at best to transfers to bedside commode with assistance.  Splane to her that combination of DVT PE, COVID, super morbid obesity, OHS is highly life-threatening.  She understands it.  She knows his prognosis is not good.  Code  Status :  Full  Diet :    Diet Order            Diet heart healthy/carb modified Room service appropriate? Yes; Fluid consistency: Thin  Diet effective now               Disposition Plan  :  TBD  Consults  :  PCCM  Procedures  :    PUD Prophylaxis : PPI  DVT Prophylaxis  : Coumadin  Lab Results  Component Value Date   PLT 93 (L) 04/14/2019   Lab Results  Component Value Date   INR 2.8 (H) 04/14/2019   INR 2.8 (H) 04/13/2019   INR 3.0 (H) 04/12/2019    Inpatient Medications  Scheduled Meds: . busPIRone  15 mg Oral BID  . Chlorhexidine Gluconate Cloth  6 each Topical Daily  . dexamethasone (DECADRON) injection  4 mg Intravenous Q24H  . dextromethorphan-guaiFENesin  1 tablet Oral BID  . DULoxetine  60 mg Oral Daily  . fentaNYL  1 patch Transdermal Q72H  . furosemide  80 mg Oral BID  . insulin aspart  0-20 Units Subcutaneous TID WC  . insulin aspart  0-5 Units Subcutaneous QHS  . insulin aspart  12 Units Subcutaneous TID WC  . insulin glargine  60 Units Subcutaneous BID  . lamoTRIgine  200 mg Oral BID  . levETIRAcetam  1,000 mg Oral BID  . loratadine  10 mg Oral Daily  . pantoprazole  40 mg Oral Daily  . potassium  chloride  40 mEq Oral Daily  . pregabalin  50 mg Oral TID  . simvastatin  20 mg Oral QHS  . tamsulosin  0.4 mg Oral QPC supper  . Warfarin - Pharmacist Dosing Inpatient   Does not apply q1800   Continuous Infusions: . sodium chloride 10 mL/hr at 04/13/19 1200  . remdesivir 100 mg in NS 250 mL 100 mg (04/14/19 1005)   PRN Meds:.sodium chloride, acetaminophen, albuterol, HYDROcodone-acetaminophen, [DISCONTINUED] ondansetron **OR** ondansetron (ZOFRAN) IV, polyethylene glycol  Antibiotics  :    Anti-infectives (From admission, onward)   Start     Dose/Rate Route Frequency Ordered Stop   04/11/19 1000  remdesivir 100 mg in sodium chloride 0.9 % 250 mL IVPB     100 mg 500 mL/hr over 30 Minutes Intravenous Every 24 hours 04/10/19 0000 04/15/19  0959   04/10/19 0100  remdesivir 200 mg in sodium chloride 0.9 % 250 mL IVPB     200 mg 500 mL/hr over 30 Minutes Intravenous Once 04/10/19 0000 04/10/19 0138       Time Spent in minutes  30   Lala Lund M.D on 04/14/2019 at 10:19 AM  To page go to [email or web address] - password [facility]  [facility] -  Office  [phone]   See all Orders from today for further details    Objective:   Vitals:   04/14/19 0415 04/14/19 0615 04/14/19 0645 04/14/19 0700  BP: (!) 154/79 134/74 126/64   Pulse: 78 86 83   Resp: 17 18 14    Temp: 98.3 F (36.8 C)   97.6 F (36.4 C)  TempSrc: Oral   Axillary  SpO2: 90% 90%  97%  Weight:      Height:        Wt Readings from Last 3 Encounters:  04/10/19 (!) 209.6 kg  02/08/19 (!) 205.2 kg  11/03/17 (!) 205.2 kg     Intake/Output Summary (Last 24 hours) at 04/14/2019 1019 Last data filed at 04/14/2019 0600 Gross per 24 hour  Intake 624.99 ml  Output 1453 ml  Net -828.01 ml     Physical Exam  Morbidly obese middle-aged white male lying in hospital bed in no distress, awake and alert, No new F.N deficits, flat affect [place].AT,PERRAL Supple Neck,No JVD, No cervical lymphadenopathy appriciated.  Symmetrical Chest wall movement, reduced bibasilar breath sounds RRR,No Gallops,Rubs or new Murmurs, No Parasternal Heave +ve B.Sounds, Abd Soft, No tenderness, No organomegaly appriciated, No rebound - guarding or rigidity. No Cyanosis, Clubbing or edema, No new Rash or bruise       Data Review:    CBC Recent Labs  Lab 04/10/19 0540 04/11/19 0542 04/12/19 0540 04/13/19 0615 04/14/19 0343  WBC 3.3* 3.3* 3.4* 4.0 3.8*  HGB 10.4* 10.8* 10.3* 10.5* 11.3*  HCT 32.4* 34.3* 32.4* 32.4* 34.5*  PLT 91* 112* 103* 109* 93*  MCV 93.4 94.2 93.1 92.8 94.3  MCH 30.0 29.7 29.6 30.1 30.9  MCHC 32.1 31.5 31.8 32.4 32.8  RDW 13.6 13.6 13.3 13.6 13.9  LYMPHSABS 0.6* 0.6* 0.5* 0.6* 0.6*  MONOABS 0.2 0.1 0.2 0.3 0.3  EOSABS 0.0 0.0 0.0 0.0 0.0   BASOSABS 0.0 0.0 0.0 0.0 0.0    Chemistries  Recent Labs  Lab 04/09/19 1331 04/10/19 0540 04/11/19 0542 04/12/19 0540 04/13/19 0615 04/14/19 0343  NA 133* 134* 135 132* 134* 140  K 3.4* 3.6 3.9 4.1 4.2 3.9  CL 95* 95* 93* 93* 94* 100  CO2 24 26 28 28 26 29   GLUCOSE  212* 257* 267* 277* 244* 144*  BUN 72* 78* 94* 99* 108* 113*  CREATININE 4.84* 5.13* 4.98* 4.76* 4.56* 4.30*  CALCIUM 7.6* 7.9* 8.3* 8.1* 8.3* 8.4*  MG 1.8  --   --   --   --   --   AST 82* 108* 96* 77* 63* 57*  ALT 28 31 32 31 31 31   ALKPHOS 74 75 73 81 79 75  BILITOT 0.6 0.8 1.0 1.0 0.6 0.7   ------------------------------------------------------------------------------------------------------------------ Recent Labs    04/13/19 0615  CHOL 81  HDL 16*  LDLCALC 33  TRIG 161*  CHOLHDL 5.1    Lab Results  Component Value Date   HGBA1C 7.9 (H) 04/10/2019   ------------------------------------------------------------------------------------------------------------------ No results for input(s): TSH, T4TOTAL, T3FREE, THYROIDAB in the last 72 hours.  Invalid input(s): FREET3  Cardiac Enzymes No results for input(s): CKMB, TROPONINI, MYOGLOBIN in the last 168 hours.  Invalid input(s): CK ------------------------------------------------------------------------------------------------------------------    Component Value Date/Time   BNP 27.0 07/13/2017 1329    Micro Results Recent Results (from the past 240 hour(s))  SARS Coronavirus 2 by RT PCR (hospital order, performed in [facility] hospital lab) Nasopharyngeal Nasopharyngeal Swab     Status: Abnormal   Collection Time: 04/09/19 12:47 PM   Specimen: Nasopharyngeal Swab  Result Value Ref Range Status   SARS Coronavirus 2 POSITIVE (A) NEGATIVE Final    Comment: RESULT CALLED TO, READ BACK BY AND VERIFIED WITH:  A. TUTTLE,RN  @1341   10/11/2020KAY (NOTE) If result is NEGATIVE SARS-CoV-2 target nucleic acids are NOT DETECTED. The SARS-CoV-2 RNA  is generally detectable in upper and lower  respiratory specimens during the acute phase of infection. The lowest  concentration of SARS-CoV-2 viral copies this assay can detect is 250  copies / mL. A negative result does not preclude SARS-CoV-2 infection  and should not be used as the sole basis for treatment or other  patient management decisions.  A negative result may occur with  improper specimen collection / handling, submission of specimen other  than nasopharyngeal swab, presence of viral mutation(s) within the  areas targeted by this assay, and inadequate number of viral copies  (<250 copies / mL). A negative result must be combined with clinical  observations, patient history, and epidemiological information. If result is POSITIVE SARS-CoV-2 target nucleic acids are DETECTED. T he SARS-CoV-2 RNA is generally detectable in upper and lower  respiratory specimens during the acute phase of infection.  Positive  results are indicative of active infection with SARS-CoV-2.  Clinical  correlation with patient history and other diagnostic information is  necessary to determine patient infection status.  Positive results do  not rule out bacterial infection or co-infection with other viruses. If result is PRESUMPTIVE POSTIVE SARS-CoV-2 nucleic acids MAY BE PRESENT.   A presumptive positive result was obtained on the submitted specimen  and confirmed on repeat testing.  While 2019 novel coronavirus  (SARS-CoV-2) nucleic acids may be present in the submitted sample  additional confirmatory testing may be necessary for epidemiological  and / or clinical management purposes  to differentiate between  SARS-CoV-2 and other Sarbecovirus currently known to infect humans.  If clinically indicated additional testing with an alternate test  methodology [phone]) is  advised. The SARS-CoV-2 RNA is generally  detectable in upper and lower respiratory specimens during the acute  phase of  infection. The expected result is Negative. Fact Sheet for Patients:  StrictlyIdeas.no Fact Sheet for Healthcare Providers: BankingDealers.co.za This test is not yet approved  or cleared by the [place] and has been authorized for detection and/or diagnosis of SARS-CoV-2 by FDA under an Emergency Use Authorization (EUA).  This EUA will remain in effect (meaning this test can be used) for the duration of the COVID-19 declaration under Section 564(b)(1) of the Act, 21 U.S.C. section 360bbb-3(b)(1), unless the authorization is terminated or revoked sooner. Performed at [facility], [address], [place], [place] [address]   Blood Culture (routine x 2)     Status: None   Collection Time: 04/09/19  2:25 PM   Specimen: Left Antecubital; Blood  Result Value Ref Range Status   Specimen Description   Final    LEFT ANTECUBITAL BOTTLES DRAWN AEROBIC AND ANAEROBIC   Special Requests Blood Culture adequate volume  Final   Culture   Final    NO GROWTH 5 DAYS Performed at [facility], [address], [place], [place] [address]    Report Status 04/14/2019 FINAL  Final  Blood Culture (routine x 2)     Status: None   Collection Time: 04/09/19  2:26 PM   Specimen: BLOOD LEFT HAND  Result Value Ref Range Status   Specimen Description   Final    BLOOD LEFT HAND BOTTLES DRAWN AEROBIC AND ANAEROBIC   Special Requests Blood Culture adequate volume  Final   Culture   Final    NO GROWTH 5 DAYS Performed at [facility], [address] [place], [place] [address]    Report Status 04/14/2019 FINAL  Final  MRSA PCR Screening     Status: None   Collection Time: 04/09/19 11:55 PM   Specimen: Nasopharyngeal  Result Value Ref Range Status   MRSA by PCR NEGATIVE NEGATIVE Final    Comment:        The GeneXpert MRSA Assay (FDA approved for NASAL specimens only), is one component of a comprehensive MRSA colonization surveillance program. It is  not intended to diagnose MRSA infection nor to guide or monitor treatment for MRSA infections. Performed at [facility], [place] [address], [place], [place] [address]     Radiology Reports Dg Chest Port 1 View  Result Date: 04/09/2019 CLINICAL DATA:  Onset respiratory distress today. COVID-19 exposure. EXAM: PORTABLE CHEST 1 VIEW COMPARISON:  PA and lateral chest 01/26/2019.  CT chest 12/28/2018. FINDINGS: Lung volumes are low. There is patchy bilateral airspace disease. Heart size is normal. No pneumothorax or pleural effusion. IMPRESSION: Patchy bilateral airspace disease worrisome for pneumonia including atypical/viral infection. Electronically Signed   By: Inge Rise M.D.   On: 04/09/2019 13:51

## 2019-04-14 NOTE — Progress Notes (Signed)
Patient had soaked pad of urine.  Bathed and performed pericare, and applied an external catheter.  Noted many areas of excoriation to scrotum and groin.  Provided barrier cream and powder, added a clean foam to sacral area and noted large dark sheered skin on each inner buttock.  Will continue to rotate the bed for turn and repositioning and provide thorough pericare.

## 2019-04-14 NOTE — Progress Notes (Signed)
Nutrition Follow-up  DOCUMENTATION CODES:   Morbid obesity  INTERVENTION:   Pt receiving Hormel Shake daily with Breakfast which provides 520 kcals and 22 g of protein and Magic cup BID with lunch and dinner, each supplement provides 290 kcal and 9 grams of protein, automatically on meal trays to optimize nutritional intake.   Encourage PO intake   NUTRITION DIAGNOSIS:   Increased nutrient needs related to (COVID-19 PNA) as evidenced by estimated needs. Ongoing.  GOAL:   Patient will meet greater than or equal to 90% of their needs Progressing.   MONITOR:   PO intake  REASON FOR ASSESSMENT:   Malnutrition Screening Tool    ASSESSMENT:   Pt with PMH of morbid obesity, sz, DM, depression, BPH, OSA, OHS on chronic 2L O2, PE, and DVT now admitted with COVID-19 PNA.    Pt on 10 L O2 via HFNC.  Meal Completion: 5-90%   Medications reviewed and include: decadron, 80 mg lasix BID, 12 units novolog TID, 60 units lantus daily, 40 mEq KCl daily Labs reviewed:  CBG's: 135-111-179-125    Diet Order:   Diet Order            Diet heart healthy/carb modified Room service appropriate? Yes; Fluid consistency: Thin  Diet effective now              EDUCATION NEEDS:   No education needs have been identified at this time  Skin:  Skin Assessment: Skin Integrity Issues: Skin Integrity Issues:: Unstageable Unstageable: coccyx  Last BM:  10/11  Height:   Ht Readings from Last 1 Encounters:  04/10/19 6\' 3"  (1.905 m)    Weight:   Wt Readings from Last 1 Encounters:  04/10/19 (!) 209.6 kg    Ideal Body Weight:  89 kg  BMI:  Body mass index is 57.75 kg/m.  Estimated Nutritional Needs:   Kcal:  1900-2200  Protein:  125-140 grams  Fluid:  2 L/day  Maylon Peppers RD, LDN, CNSC [phone] Pager [phone] After Hours Pager

## 2019-04-15 DIAGNOSIS — R0902 Hypoxemia: Secondary | ICD-10-CM | POA: Diagnosis not present

## 2019-04-15 LAB — CBC WITH DIFFERENTIAL/PLATELET
Abs Immature Granulocytes: 0.06 10*3/uL (ref 0.00–0.07)
Basophils Absolute: 0 10*3/uL (ref 0.0–0.1)
Basophils Relative: 0 %
Eosinophils Absolute: 0 10*3/uL (ref 0.0–0.5)
Eosinophils Relative: 1 %
HCT: 34.8 % — ABNORMAL LOW (ref 39.0–52.0)
Hemoglobin: 11.2 g/dL — ABNORMAL LOW (ref 13.0–17.0)
Immature Granulocytes: 2 %
Lymphocytes Relative: 16 %
Lymphs Abs: 0.5 10*3/uL — ABNORMAL LOW (ref 0.7–4.0)
MCH: 29.9 pg (ref 26.0–34.0)
MCHC: 32.2 g/dL (ref 30.0–36.0)
MCV: 93 fL (ref 80.0–100.0)
Monocytes Absolute: 0.2 10*3/uL (ref 0.1–1.0)
Monocytes Relative: 7 %
Neutro Abs: 2.4 10*3/uL (ref 1.7–7.7)
Neutrophils Relative %: 74 %
Platelets: 86 10*3/uL — ABNORMAL LOW (ref 150–400)
RBC: 3.74 MIL/uL — ABNORMAL LOW (ref 4.22–5.81)
RDW: 14.1 % (ref 11.5–15.5)
WBC: 3.2 10*3/uL — ABNORMAL LOW (ref 4.0–10.5)
nRBC: 0 % (ref 0.0–0.2)

## 2019-04-15 LAB — COMPREHENSIVE METABOLIC PANEL
ALT: 29 U/L (ref 0–44)
AST: 51 U/L — ABNORMAL HIGH (ref 15–41)
Albumin: 2.3 g/dL — ABNORMAL LOW (ref 3.5–5.0)
Alkaline Phosphatase: 76 U/L (ref 38–126)
Anion gap: 12 (ref 5–15)
BUN: 123 mg/dL — ABNORMAL HIGH (ref 6–20)
CO2: 30 mmol/L (ref 22–32)
Calcium: 8.1 mg/dL — ABNORMAL LOW (ref 8.9–10.3)
Chloride: 102 mmol/L (ref 98–111)
Creatinine, Ser: 3.97 mg/dL — ABNORMAL HIGH (ref 0.61–1.24)
GFR calc Af Amer: 19 mL/min — ABNORMAL LOW (ref >60)
GFR calc non Af Amer: 16 mL/min — ABNORMAL LOW (ref >60)
Glucose, Bld: 147 mg/dL — ABNORMAL HIGH (ref 70–99)
Potassium: 3.6 mmol/L (ref 3.5–5.1)
Sodium: 144 mmol/L (ref 135–145)
Total Bilirubin: 1.1 mg/dL (ref 0.3–1.2)
Total Protein: 6.5 g/dL (ref 6.5–8.1)

## 2019-04-15 LAB — PROTIME-INR
INR: 3.2 — ABNORMAL HIGH (ref 0.8–1.2)
Prothrombin Time: 32.1 seconds — ABNORMAL HIGH (ref 11.4–15.2)

## 2019-04-15 LAB — GLUCOSE, CAPILLARY
Glucose-Capillary: 149 mg/dL — ABNORMAL HIGH (ref 70–99)
Glucose-Capillary: 324 mg/dL — ABNORMAL HIGH (ref 70–99)
Glucose-Capillary: 136 mg/dL — ABNORMAL HIGH (ref 70–99)

## 2019-04-15 LAB — BRAIN NATRIURETIC PEPTIDE: B Natriuretic Peptide: 15.7 pg/mL (ref 0.0–100.0)

## 2019-04-15 LAB — D-DIMER, QUANTITATIVE: D-Dimer, Quant: 0.4 ug/mL-FEU (ref 0.00–0.50)

## 2019-04-15 LAB — MAGNESIUM: Magnesium: 2 mg/dL (ref 1.7–2.4)

## 2019-04-15 LAB — C-REACTIVE PROTEIN: CRP: 6.6 mg/dL — ABNORMAL HIGH (ref <1.0)

## 2019-04-15 MED ORDER — SALINE SPRAY 0.65 % NA SOLN
1.0000 | NASAL | Status: DC | PRN
  Administered 2019-04-15: 1 via NASAL
  Filled 2019-04-15 (×2): qty 44

## 2019-04-15 MED ORDER — SPIRONOLACTONE 25 MG PO TABS
25.0000 mg | ORAL_TABLET | Freq: Every day | ORAL | Status: DC
  Administered 2019-04-15 – 2019-04-22 (×8): 25 mg via ORAL
  Filled 2019-04-15 (×9): qty 1

## 2019-04-15 MED ORDER — WARFARIN SODIUM 2.5 MG PO TABS
2.5000 mg | ORAL_TABLET | Freq: Once | ORAL | Status: AC
  Administered 2019-04-15: 2.5 mg via ORAL
  Filled 2019-04-15: qty 1

## 2019-04-15 MED ORDER — RESOURCE THICKENUP CLEAR PO POWD: ORAL | Status: DC | PRN

## 2019-04-15 MED ORDER — GUAIFENESIN-DM 100-10 MG/5ML PO SYRP
5.0000 mL | ORAL_SOLUTION | ORAL | Status: DC | PRN
  Filled 2019-04-15: qty 10

## 2019-04-15 NOTE — Progress Notes (Signed)
Received orders for swallow eval. Will be on [facility] this afternoon (Saturday) to assess the pt.  Herbie Baltimore, [place]  Acute Rehabilitation Services Pager [phone] Office [phone]

## 2019-04-15 NOTE — Progress Notes (Signed)
ANTICOAGULATION CONSULT NOTE - Follow Up Consult  Pharmacy Consult for warfarin Indication: history of pulmonary embolus and DVT  Allergies  Allergen Reactions  . Bee Venom Anaphylaxis, Shortness Of Breath and Swelling  . Other Shortness Of Breath    Itching, rash with IVP DYE, iodine, shellfish LATEX  . Penicillins Anaphylaxis and Shortness Of Breath    Has patient had a PCN reaction causing immediate rash, facial/tongue/throat swelling, SOB or lightheadedness with hypotension: Yes Has patient had a PCN reaction causing severe rash involving mucus membranes or skin necrosis: No Has patient had a PCN reaction that required hospitalization No Has patient had a PCN reaction occurring within the last 10 years: No If all of the above answers are "NO", then may proceed with Cephalosporin use.   . Shellfish Allergy Nausea And Vomiting and Other (See Comments)    Feels like insides are twisting  . Iodinated Diagnostic Agents     Other reaction(s): RASH  . Iohexol      Code: RASH, Desc: PT WAS ON PREDNISONE FOR GOUT TX. @ TIME OF SCAN AND RECEIVED 50 MG OF BENADRYL IV-ARS 10/08/07   . Iodine Rash  . Latex Rash    Patient Measurements: Height: 6\' 3"  (190.5 cm) Weight: (!) 462 lb (209.6 kg) IBW/kg (Calculated) : 84.5   Vital Signs: Temp: 97.8 F (36.6 C) (10/17 0800) Temp Source: Oral (10/17 0800) BP: 154/66 (10/17 0800) Pulse Rate: 82 (10/17 0800)  Labs: Recent Labs    04/13/19 0615 04/14/19 0343 04/15/19 0349  HGB 10.5* 11.3* 11.2*  HCT 32.4* 34.5* 34.8*  PLT 109* 93* 86*  LABPROT 29.0* 28.7* 32.1*  INR 2.8* 2.8* 3.2*  CREATININE 4.56* 4.30* 3.97*    Estimated Creatinine Clearance: 41.4 mL/min (A) (by C-G formula based on SCr of 3.97 mg/dL (H)).   Medical History: Past Medical History:  Diagnosis Date  . Anemia   . Anxiety   . Arthritis   . Bipolar 1 disorder ([place])   . Bronchitis    hx of  . Bruises easily   . Chronic kidney disease    decreased left  kidney fx  . Chronic pain syndrome 05/11/2012  . Chronic respiratory failure with hypoxia (HCC)    And with hypercapnia  . Diabetes mellitus without complication ([place])   . Diabetic neuropathy ([place])   . DVT (deep venous thrombosis) (HCC)    LLE DVT ~ '12  . Dyspnea    with ambulation  . GERD (gastroesophageal reflux disease)   . HOH (hard of hearing) 2015   has hearing aids  . Mental disorder   . Migraine   . Neuromuscular disorder ([place])   . Obesity hypoventilation syndrome ([place])   . Obstructive sleep apnea    CPAP  . PE (pulmonary embolism)    bilateral PE ~ '11  . Seizures ([place])   . Thrombocytopenia ([place]) 05/11/2012    Medications:  Scheduled:  . busPIRone  15 mg Oral BID  . Chlorhexidine Gluconate Cloth  6 each Topical Daily  . dexamethasone (DECADRON) injection  4 mg Intravenous Q24H  . dextromethorphan-guaiFENesin  1 tablet Oral BID  . DULoxetine  60 mg Oral Daily  . fentaNYL  1 patch Transdermal Q72H  . furosemide  80 mg Oral BID  . insulin aspart  0-20 Units Subcutaneous TID WC  . insulin aspart  0-5 Units Subcutaneous QHS  . insulin aspart  12 Units Subcutaneous TID WC  . insulin glargine  60 Units Subcutaneous BID  . lamoTRIgine  200 mg Oral BID  . levETIRAcetam  1,000 mg Oral BID  . loratadine  10 mg Oral Daily  . pantoprazole  40 mg Oral Daily  . potassium chloride  40 mEq Oral Daily  . pregabalin  50 mg Oral TID  . simvastatin  20 mg Oral QHS  . spironolactone  25 mg Oral Daily  . tamsulosin  0.4 mg Oral QPC supper  . Warfarin - Pharmacist Dosing Inpatient   Does not apply q1800    Assessment: 53 yo male admitted on 04/09/2019 for SOB and found to be positive for COVID-19. Pharmacy is consulted to dose warfarin for history of DVT and PE. Patient is on warfarin prior to admission. On admission, INR was supratherapeutic and warfarin was been held for three days (10/11-10/13). Warfarin was resumed on 10/14 when INR was therapeutic.   Today, INR of 3.2 is  slightly supra-therapeutic. Will dose warfarin slightly lower than home regimen due to the potential for dexamethasone to increase INR. H/H low stable. Platelets 86 (baseline ~100-120k).   Prior to Admission Warfarin Regimen: warfarin 5mg  daily  Goal of Therapy:  INR 2-3 Monitor platelets by anticoagulation protocol: Yes   Plan:  Warfarin 2.5 mg x1 on 10/17  Monitor daily INR, CBC, and S/S of bleeding   Albertina Parr, PharmD., BCPS Clinical Pharmacist Clinical phone for 04/15/19 until 5pm: [phone]

## 2019-04-15 NOTE — Progress Notes (Signed)
Pt stayed in the bed for 40 minutes and asked to be back in bed. Pt was educated about benefits sitting in chair for longer period of time. Pt got very agitated. Pt is back in bed

## 2019-04-15 NOTE — Progress Notes (Signed)
PROGRESS NOTE                                                                                                                                                                                                             Patient Demographics:    [name], is a 53 y.o. male, DOB - 22-Aug-1965, KCL:[id number]  Outpatient Primary MD for the patient is Sharion Balloon, FNP    LOS - 6  Admit date - 04/09/2019    Chief Complaint  Patient presents with  . Respiratory Distress       Brief Narrative -  53 year old super morbidly obese Caucasian male with history of seizures, DM type II, depression, BPH, OSA, OHS on 2 to 3 L nasal cannula oxygen at home.  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  He was admitted to ICU stabilized and transferred to hospitalist service on 04/14/2019.   Subjective:    [name] today has, No headache, No chest pain, No abdominal pain - No Nausea, No new weakness tingling or numbness, no cough.  Mild shortness of breath.   Assessment  & Plan :     1. Acute on chronic hypoxic Resp. Failure due to Acute Covid 19 Viral Pneumonitis during the ongoing 2020 Covid 19 Pandemic - he has baseline underlying obesity related hypoventilation syndrome and uses about 3 L nasal cannula at all times.  Treated this admission in ICU on IV Remdisvir and Decadron, tapering Decadron gradually, currently on 8 to 10 L nasal cannula oxygen.  When he moves he requires nonrebreather mask.  Also is developing atelectasis from his bedbound status and not moving out of bed compounded by his super morbid obesity.  Encouraged to sit up in chair in the daytime and use I-S and flutter valve for pulmonary toiletry.  Continue monitoring clinically.   COVID-19 Labs  Recent Labs    04/13/19 0615  04/14/19 0343 04/15/19 0349  DDIMER 0.42 0.40 0.40  FERRITIN 118 195  --   CRP 3.4* 4.1* 6.6*    Lab Results  Component Value Date   SARSCOV2NAA POSITIVE (A) 04/09/2019    SpO2: 94 % O2 Flow Rate (L/min): 10 L/min FiO2 (%): 87 %  Hepatic Function Latest  Ref Rng & Units 04/15/2019 04/14/2019 04/13/2019  Total Protein 6.5 - 8.1 g/dL 6.5 6.5 6.5  Albumin 3.5 - 5.0 g/dL 2.3(L) 2.3(L) 2.3(L)  AST 15 - 41 U/L 51(H) 57(H) 63(H)  ALT 0 - 44 U/L 29 31 31   Alk Phosphatase 38 - 126 U/L 76 75 79  Total Bilirubin 0.3 - 1.2 mg/dL 1.1 0.7 0.6  Bilirubin, Direct 0.0 - 0.3 mg/dL - - -        Component Value Date/Time   BNP 15.7 04/15/2019 0349      2.  OSA/OHS - currently not on CPAP at home.  3.  History of PE and DVT.  On Coumadin INR therapeutic.  Pharmacy monitoring.  4. Hx seizure - Stable, continue combination of Keppra and Lamictal.   5.  Dyslipidemia.  On Zocor.  6.  Morbid obesity BMI of 57.  Outpatient follow-up PCP for weight loss.  7.  Deconditioning.  PT OT consulted.  Sit to sit up in chair and daytime.  8.  Depression and bipolar disorder.  Continue buspirone, Cymbalta and Lyrica combination.  9.  AKI on CKD 4.  Baseline creatinine 3.5.  Initially had AKI due to ATN now improving with ongoing diuresis with oral Lasix 80 mg twice daily.  Monitor renal function closely with ongoing diuresis.  10. DM type II.  On Lantus and sliding scale combination.  Outpatient glycemic control is slightly poor due to hyperglycemia.  A1c was 7.9  CBG (last 3)  Recent Labs    04/14/19 1149 04/14/19 1542 04/15/19 0725  GLUCAP 179* 125* 149*     Condition -  Guarded  Family Communication  :   Updated wife [name] on 04/14/2019.  She tells me that patient is virtually bedbound and at best to transfers to bedside commode with assistance.  Splane to her that combination of DVT PE, COVID, super morbid obesity, OHS is highly life-threatening.  She understands it.  She knows his  prognosis is not good.  Code Status :  Full  Diet :    Diet Order            DIET SOFT Room service appropriate? Yes; Fluid consistency: Honey Thick  Diet effective now               Disposition Plan  :  TBD  Consults  :  PCCM  Procedures  :    PUD Prophylaxis : PPI  DVT Prophylaxis  : Coumadin  Lab Results  Component Value Date   PLT 86 (L) 04/15/2019   Lab Results  Component Value Date   INR 3.2 (H) 04/15/2019   INR 2.8 (H) 04/14/2019   INR 2.8 (H) 04/13/2019    Inpatient Medications  Scheduled Meds: . busPIRone  15 mg Oral BID  . Chlorhexidine Gluconate Cloth  6 each Topical Daily  . dexamethasone (DECADRON) injection  4 mg Intravenous Q24H  . dextromethorphan-guaiFENesin  1 tablet Oral BID  . DULoxetine  60 mg Oral Daily  . fentaNYL  1 patch Transdermal Q72H  . furosemide  80 mg Oral BID  . insulin aspart  0-20 Units Subcutaneous TID WC  . insulin aspart  0-5 Units Subcutaneous QHS  . insulin aspart  12 Units Subcutaneous TID WC  . insulin glargine  60 Units Subcutaneous BID  . lamoTRIgine  200 mg Oral BID  . levETIRAcetam  1,000 mg Oral BID  . loratadine  10 mg Oral Daily  . pantoprazole  40 mg Oral Daily  .  potassium chloride  40 mEq Oral Daily  . pregabalin  50 mg Oral TID  . simvastatin  20 mg Oral QHS  . tamsulosin  0.4 mg Oral QPC supper  . Warfarin - Pharmacist Dosing Inpatient   Does not apply q1800   Continuous Infusions: . sodium chloride 10 mL/hr at 04/13/19 1200   PRN Meds:.sodium chloride, acetaminophen, albuterol, guaiFENesin-dextromethorphan, HYDROcodone-acetaminophen, [DISCONTINUED] ondansetron **OR** ondansetron (ZOFRAN) IV, polyethylene glycol, Resource ThickenUp Clear  Antibiotics  :    Anti-infectives (From admission, onward)   Start     Dose/Rate Route Frequency Ordered Stop   04/11/19 1000  remdesivir 100 mg in sodium chloride 0.9 % 250 mL IVPB     100 mg 500 mL/hr over 30 Minutes Intravenous Every 24 hours 04/10/19 0000  04/14/19 1035   04/10/19 0100  remdesivir 200 mg in sodium chloride 0.9 % 250 mL IVPB     200 mg 500 mL/hr over 30 Minutes Intravenous Once 04/10/19 0000 04/10/19 0138       Time Spent in minutes  30   Lala Lund M.D on 04/15/2019 at 10:00 AM  To page go to [email or web address] - password [facility]  [facility] -  Office  [phone]   See all Orders from today for further details    Objective:   Vitals:   04/14/19 2100 04/15/19 0250 04/15/19 0340 04/15/19 0800  BP: 135/70  138/75 (!) 154/66  Pulse: 86 83 82 82  Resp: 15 20 18 19   Temp: (!) 97.5 F (36.4 C)  97.6 F (36.4 C) 97.8 F (36.6 C)  TempSrc: Oral  Oral Oral  SpO2: 93% 90% 90% 94%  Weight:      Height:        Wt Readings from Last 3 Encounters:  04/10/19 (!) 209.6 kg  02/08/19 (!) 205.2 kg  11/03/17 (!) 205.2 kg     Intake/Output Summary (Last 24 hours) at 04/15/2019 1000 Last data filed at 04/15/2019 8527 Gross per 24 hour  Intake 1100 ml  Output 4225 ml  Net -3125 ml     Physical Exam  Morbidly obese middle-aged white male lying in hospital bed in no distress, awake and alert, No new F.N deficits, flat affect [place].AT,PERRAL Supple Neck,No JVD, No cervical lymphadenopathy appriciated.  Symmetrical Chest wall movement, Good air movement bilaterally, CTAB RRR,No Gallops, Rubs or new Murmurs, No Parasternal Heave +ve B.Sounds, Abd Soft, No tenderness, No organomegaly appriciated, No rebound - guarding or rigidity. No Cyanosis, Clubbing or edema, No new Rash or bruise      Data Review:    CBC Recent Labs  Lab 04/11/19 0542 04/12/19 0540 04/13/19 0615 04/14/19 0343 04/15/19 0349  WBC 3.3* 3.4* 4.0 3.8* 3.2*  HGB 10.8* 10.3* 10.5* 11.3* 11.2*  HCT 34.3* 32.4* 32.4* 34.5* 34.8*  PLT 112* 103* 109* 93* 86*  MCV 94.2 93.1 92.8 94.3 93.0  MCH 29.7 29.6 30.1 30.9 29.9  MCHC 31.5 31.8 32.4 32.8 32.2  RDW 13.6 13.3 13.6 13.9 14.1  LYMPHSABS 0.6* 0.5* 0.6* 0.6* 0.5*  MONOABS 0.1 0.2 0.3  0.3 0.2  EOSABS 0.0 0.0 0.0 0.0 0.0  BASOSABS 0.0 0.0 0.0 0.0 0.0    Chemistries  Recent Labs  Lab 04/09/19 1331  04/11/19 0542 04/12/19 0540 04/13/19 0615 04/14/19 0343 04/15/19 0349  NA 133*   < > 135 132* 134* 140 144  K 3.4*   < > 3.9 4.1 4.2 3.9 3.6  CL 95*   < > 93* 93* 94* 100 102  CO2 24   < > 28 28 26 29 30   GLUCOSE 212*   < > 267* 277* 244* 144* 147*  BUN 72*   < > 94* 99* 108* 113* 123*  CREATININE 4.84*   < > 4.98* 4.76* 4.56* 4.30* 3.97*  CALCIUM 7.6*   < > 8.3* 8.1* 8.3* 8.4* 8.1*  MG 1.8  --   --   --   --   --  2.0  AST 82*   < > 96* 77* 63* 57* 51*  ALT 28   < > 32 31 31 31 29   ALKPHOS 74   < > 73 81 79 75 76  BILITOT 0.6   < > 1.0 1.0 0.6 0.7 1.1   < > = values in this interval not displayed.   ------------------------------------------------------------------------------------------------------------------ Recent Labs    04/13/19 0615  CHOL 81  HDL 16*  LDLCALC 33  TRIG 161*  CHOLHDL 5.1    Lab Results  Component Value Date   HGBA1C 7.9 (H) 04/10/2019   ------------------------------------------------------------------------------------------------------------------ No results for input(s): TSH, T4TOTAL, T3FREE, THYROIDAB in the last 72 hours.  Invalid input(s): FREET3  Cardiac Enzymes No results for input(s): CKMB, TROPONINI, MYOGLOBIN in the last 168 hours.  Invalid input(s): CK ------------------------------------------------------------------------------------------------------------------    Component Value Date/Time   BNP 15.7 04/15/2019 0349    Micro Results Recent Results (from the past 240 hour(s))  SARS Coronavirus 2 by RT PCR (hospital order, performed in [facility] hospital lab) Nasopharyngeal Nasopharyngeal Swab     Status: Abnormal   Collection Time: 04/09/19 12:47 PM   Specimen: Nasopharyngeal Swab  Result Value Ref Range Status   SARS Coronavirus 2 POSITIVE (A) NEGATIVE Final    Comment: RESULT CALLED TO, READ BACK  BY AND VERIFIED WITH:  A. TUTTLE,RN  @1341   10/11/2020KAY (NOTE) If result is NEGATIVE SARS-CoV-2 target nucleic acids are NOT DETECTED. The SARS-CoV-2 RNA is generally detectable in upper and lower  respiratory specimens during the acute phase of infection. The lowest  concentration of SARS-CoV-2 viral copies this assay can detect is 250  copies / mL. A negative result does not preclude SARS-CoV-2 infection  and should not be used as the sole basis for treatment or other  patient management decisions.  A negative result may occur with  improper specimen collection / handling, submission of specimen other  than nasopharyngeal swab, presence of viral mutation(s) within the  areas targeted by this assay, and inadequate number of viral copies  (<250 copies / mL). A negative result must be combined with clinical  observations, patient history, and epidemiological information. If result is POSITIVE SARS-CoV-2 target nucleic acids are DETECTED. T he SARS-CoV-2 RNA is generally detectable in upper and lower  respiratory specimens during the acute phase of infection.  Positive  results are indicative of active infection with SARS-CoV-2.  Clinical  correlation with patient history and other diagnostic information is  necessary to determine patient infection status.  Positive results do  not rule out bacterial infection or co-infection with other viruses. If result is PRESUMPTIVE POSTIVE SARS-CoV-2 nucleic acids MAY BE PRESENT.   A presumptive positive result was obtained on the submitted specimen  and confirmed on repeat testing.  While 2019 novel coronavirus  (SARS-CoV-2) nucleic acids may be present in the submitted sample  additional confirmatory testing may be necessary for epidemiological  and / or clinical management purposes  to differentiate between  SARS-CoV-2 and other Sarbecovirus currently known to infect humans.  If clinically  indicated additional testing with an alternate test   methodology [phone]) is  advised. The SARS-CoV-2 RNA is generally  detectable in upper and lower respiratory specimens during the acute  phase of infection. The expected result is Negative. Fact Sheet for Patients:  StrictlyIdeas.no Fact Sheet for Healthcare Providers: BankingDealers.co.za This test is not yet approved or cleared by the [place] FDA and has been authorized for detection and/or diagnosis of SARS-CoV-2 by FDA under an Emergency Use Authorization (EUA).  This EUA will remain in effect (meaning this test can be used) for the duration of the COVID-19 declaration under Section 564(b)(1) of the Act, 21 U.S.C. section 360bbb-3(b)(1), unless the authorization is terminated or revoked sooner. Performed at [facility], [address] [place], [place] [address]   Blood Culture (routine x 2)     Status: None   Collection Time: 04/09/19  2:25 PM   Specimen: Left Antecubital; Blood  Result Value Ref Range Status   Specimen Description   Final    LEFT ANTECUBITAL BOTTLES DRAWN AEROBIC AND ANAEROBIC   Special Requests Blood Culture adequate volume  Final   Culture   Final    NO GROWTH 5 DAYS Performed at [facility], [address], [place], [place] [address]    Report Status 04/14/2019 FINAL  Final  Blood Culture (routine x 2)     Status: None   Collection Time: 04/09/19  2:26 PM   Specimen: BLOOD LEFT HAND  Result Value Ref Range Status   Specimen Description   Final    BLOOD LEFT HAND BOTTLES DRAWN AEROBIC AND ANAEROBIC   Special Requests Blood Culture adequate volume  Final   Culture   Final    NO GROWTH 5 DAYS Performed at [facility], [address], [place], [place] [address]    Report Status 04/14/2019 FINAL  Final  MRSA PCR Screening     Status: None   Collection Time: 04/09/19 11:55 PM   Specimen: Nasopharyngeal  Result Value Ref Range Status   MRSA by PCR NEGATIVE NEGATIVE Final    Comment:        The  GeneXpert MRSA Assay (FDA approved for NASAL specimens only), is one component of a comprehensive MRSA colonization surveillance program. It is not intended to diagnose MRSA infection nor to guide or monitor treatment for MRSA infections. Performed at [facility], [place] [address] [place], [place] [address]     Radiology Reports Dg Chest Port 1 View  Result Date: 04/09/2019 CLINICAL DATA:  Onset respiratory distress today. COVID-19 exposure. EXAM: PORTABLE CHEST 1 VIEW COMPARISON:  PA and lateral chest 01/26/2019.  CT chest 12/28/2018. FINDINGS: Lung volumes are low. There is patchy bilateral airspace disease. Heart size is normal. No pneumothorax or pleural effusion. IMPRESSION: Patchy bilateral airspace disease worrisome for pneumonia including atypical/viral infection. Electronically Signed   By: Inge Rise M.D.   On: 04/09/2019 13:51
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
## 2019-04-16 DIAGNOSIS — R0902 Hypoxemia: Secondary | ICD-10-CM | POA: Diagnosis not present

## 2019-04-16 LAB — CBC WITH DIFFERENTIAL/PLATELET
Abs Immature Granulocytes: 0.03 10*3/uL (ref 0.00–0.07)
Basophils Absolute: 0 10*3/uL (ref 0.0–0.1)
Basophils Relative: 0 %
Eosinophils Absolute: 0 10*3/uL (ref 0.0–0.5)
Eosinophils Relative: 2 %
HCT: 34.3 % — ABNORMAL LOW (ref 39.0–52.0)
Hemoglobin: 10.8 g/dL — ABNORMAL LOW (ref 13.0–17.0)
Immature Granulocytes: 1 %
Lymphocytes Relative: 15 %
Lymphs Abs: 0.4 10*3/uL — ABNORMAL LOW (ref 0.7–4.0)
MCH: 29.7 pg (ref 26.0–34.0)
MCHC: 31.5 g/dL (ref 30.0–36.0)
MCV: 94.2 fL (ref 80.0–100.0)
Monocytes Absolute: 0.2 10*3/uL (ref 0.1–1.0)
Monocytes Relative: 8 %
Neutro Abs: 1.9 10*3/uL (ref 1.7–7.7)
Neutrophils Relative %: 74 %
Platelets: 81 10*3/uL — ABNORMAL LOW (ref 150–400)
RBC: 3.64 MIL/uL — ABNORMAL LOW (ref 4.22–5.81)
RDW: 14.4 % (ref 11.5–15.5)
WBC: 2.5 10*3/uL — ABNORMAL LOW (ref 4.0–10.5)
nRBC: 0 % (ref 0.0–0.2)

## 2019-04-16 LAB — PROTIME-INR
INR: 3.5 — ABNORMAL HIGH (ref 0.8–1.2)
Prothrombin Time: 34.6 seconds — ABNORMAL HIGH (ref 11.4–15.2)

## 2019-04-16 LAB — GLUCOSE, CAPILLARY
Glucose-Capillary: 226 mg/dL — ABNORMAL HIGH (ref 70–99)
Glucose-Capillary: 321 mg/dL — ABNORMAL HIGH (ref 70–99)
Glucose-Capillary: 242 mg/dL — ABNORMAL HIGH (ref 70–99)
Glucose-Capillary: 281 mg/dL — ABNORMAL HIGH (ref 70–99)

## 2019-04-16 LAB — COMPREHENSIVE METABOLIC PANEL
ALT: 26 U/L (ref 0–44)
AST: 41 U/L (ref 15–41)
Albumin: 2.1 g/dL — ABNORMAL LOW (ref 3.5–5.0)
Alkaline Phosphatase: 73 U/L (ref 38–126)
Anion gap: 11 (ref 5–15)
BUN: 104 mg/dL — ABNORMAL HIGH (ref 6–20)
CO2: 31 mmol/L (ref 22–32)
Calcium: 8.3 mg/dL — ABNORMAL LOW (ref 8.9–10.3)
Chloride: 103 mmol/L (ref 98–111)
Creatinine, Ser: 4.19 mg/dL — ABNORMAL HIGH (ref 0.61–1.24)
GFR calc Af Amer: 18 mL/min — ABNORMAL LOW (ref >60)
GFR calc non Af Amer: 15 mL/min — ABNORMAL LOW (ref >60)
Glucose, Bld: 238 mg/dL — ABNORMAL HIGH (ref 70–99)
Potassium: 4.2 mmol/L (ref 3.5–5.1)
Sodium: 145 mmol/L (ref 135–145)
Total Bilirubin: 1 mg/dL (ref 0.3–1.2)
Total Protein: 6.4 g/dL — ABNORMAL LOW (ref 6.5–8.1)

## 2019-04-16 LAB — C-REACTIVE PROTEIN: CRP: 6.3 mg/dL — ABNORMAL HIGH (ref <1.0)

## 2019-04-16 LAB — MAGNESIUM: Magnesium: 2.1 mg/dL (ref 1.7–2.4)

## 2019-04-16 LAB — D-DIMER, QUANTITATIVE: D-Dimer, Quant: 0.45 ug/mL-FEU (ref 0.00–0.50)

## 2019-04-16 LAB — BRAIN NATRIURETIC PEPTIDE: B Natriuretic Peptide: 15.4 pg/mL (ref 0.0–100.0)

## 2019-04-16 MED ORDER — HYDROCODONE-ACETAMINOPHEN 5-325 MG PO TABS
1.0000 | ORAL_TABLET | Freq: Three times a day (TID) | ORAL | Status: DC | PRN
  Administered 2019-04-16 – 2019-04-25 (×6): 1 via ORAL
  Filled 2019-04-16 (×6): qty 1

## 2019-04-16 MED ORDER — DEXAMETHASONE SODIUM PHOSPHATE 4 MG/ML IJ SOLN
2.0000 mg | INTRAMUSCULAR | Status: DC
  Administered 2019-04-16 – 2019-04-17 (×2): 2 mg via INTRAVENOUS
  Filled 2019-04-16 (×2): qty 1

## 2019-04-16 MED ORDER — INSULIN GLARGINE 100 UNIT/ML ~~LOC~~ SOLN
65.0000 [IU] | Freq: Two times a day (BID) | SUBCUTANEOUS | Status: DC
  Administered 2019-04-16 – 2019-04-18 (×4): 65 [IU] via SUBCUTANEOUS
  Filled 2019-04-16 (×5): qty 0.65

## 2019-04-16 NOTE — Progress Notes (Signed)
ANTICOAGULATION CONSULT NOTE - Follow Up Consult  Pharmacy Consult for warfarin Indication: history of pulmonary embolus and DVT  Allergies  Allergen Reactions  . Bee Venom Anaphylaxis, Shortness Of Breath and Swelling  . Other Shortness Of Breath    Itching, rash with IVP DYE, iodine, shellfish LATEX  . Penicillins Anaphylaxis and Shortness Of Breath    Has patient had a PCN reaction causing immediate rash, facial/tongue/throat swelling, SOB or lightheadedness with hypotension: Yes Has patient had a PCN reaction causing severe rash involving mucus membranes or skin necrosis: No Has patient had a PCN reaction that required hospitalization No Has patient had a PCN reaction occurring within the last 10 years: No If all of the above answers are "NO", then may proceed with Cephalosporin use.   . Shellfish Allergy Nausea And Vomiting and Other (See Comments)    Feels like insides are twisting  . Iodinated Diagnostic Agents     Other reaction(s): RASH  . Iohexol      Code: RASH, Desc: PT WAS ON PREDNISONE FOR GOUT TX. @ TIME OF SCAN AND RECEIVED 50 MG OF BENADRYL IV-ARS 10/08/07   . Iodine Rash  . Latex Rash    Patient Measurements: Height: 6\' 3"  (190.5 cm) Weight: (!) 462 lb (209.6 kg) IBW/kg (Calculated) : 84.5   Vital Signs: Temp: 98.6 F (37 C) (10/18 0125) Temp Source: Oral (10/18 0125) BP: 135/81 (10/18 0125) Pulse Rate: 77 (10/18 0125)  Labs: Recent Labs    04/14/19 0343 04/15/19 0349 04/16/19 0307  HGB 11.3* 11.2* 10.8*  HCT 34.5* 34.8* 34.3*  PLT 93* 86* 81*  LABPROT 28.7* 32.1* 34.6*  INR 2.8* 3.2* 3.5*  CREATININE 4.30* 3.97* 4.19*    Estimated Creatinine Clearance: 39.2 mL/min (A) (by C-G formula based on SCr of 4.19 mg/dL (H)).   Medical History: Past Medical History:  Diagnosis Date  . Anemia   . Anxiety   . Arthritis   . Bipolar 1 disorder ([place])   . Bronchitis    hx of  . Bruises easily   . Chronic kidney disease    decreased left kidney  fx  . Chronic pain syndrome 05/11/2012  . Chronic respiratory failure with hypoxia (HCC)    And with hypercapnia  . Diabetes mellitus without complication ([place])   . Diabetic neuropathy ([place])   . DVT (deep venous thrombosis) (HCC)    LLE DVT ~ '12  . Dyspnea    with ambulation  . GERD (gastroesophageal reflux disease)   . HOH (hard of hearing) 2015   has hearing aids  . Mental disorder   . Migraine   . Neuromuscular disorder ([place])   . Obesity hypoventilation syndrome ([place])   . Obstructive sleep apnea    CPAP  . PE (pulmonary embolism)    bilateral PE ~ '11  . Seizures ([place])   . Thrombocytopenia ([place]) 05/11/2012    Medications:  Scheduled:  . busPIRone  15 mg Oral BID  . Chlorhexidine Gluconate Cloth  6 each Topical Daily  . dexamethasone (DECADRON) injection  4 mg Intravenous Q24H  . dextromethorphan-guaiFENesin  1 tablet Oral BID  . DULoxetine  60 mg Oral Daily  . fentaNYL  1 patch Transdermal Q72H  . furosemide  80 mg Oral BID  . insulin aspart  0-20 Units Subcutaneous TID WC  . insulin aspart  0-5 Units Subcutaneous QHS  . insulin aspart  12 Units Subcutaneous TID WC  . insulin glargine  60 Units Subcutaneous BID  . lamoTRIgine  200 mg Oral BID  . levETIRAcetam  1,000 mg Oral BID  . loratadine  10 mg Oral Daily  . pantoprazole  40 mg Oral Daily  . potassium chloride  40 mEq Oral Daily  . pregabalin  50 mg Oral TID  . simvastatin  20 mg Oral QHS  . spironolactone  25 mg Oral Daily  . tamsulosin  0.4 mg Oral QPC supper  . Warfarin - Pharmacist Dosing Inpatient   Does not apply q1800    Assessment: 53 yo male admitted on 04/09/2019 for SOB and found to be positive for COVID-19. Pharmacy is consulted to dose warfarin for history of DVT and PE. Patient is on warfarin prior to admission. On admission, INR was supratherapeutic and warfarin was been held for three days (10/11-10/13). Warfarin was resumed on 10/14 when INR was therapeutic.   Today, INR up to 3.5 today.  Possibly related to drug interaction and poor oral intake. H/H low stable. Platelets down to 81 (baseline ~100-120k).   Significant drug interactions: Dexamethasone  Prior to Admission Warfarin Regimen: warfarin 5mg  daily  Goal of Therapy:  INR 2-3 Monitor platelets by anticoagulation protocol: Yes   Plan:  Hold warfarin today  Monitor daily INR, CBC, and S/S of bleeding   Albertina Parr, PharmD., BCPS Clinical Pharmacist Clinical phone for 04/16/19 until 5pm: [phone]

## 2019-04-16 NOTE — Progress Notes (Signed)
Pt up to chair for two hours, pt has very high fluid intake 2-3  juices, cup ice and  10oz water every hour, pt will not eat any food

## 2019-04-16 NOTE — Progress Notes (Signed)
PROGRESS NOTE                                                                                                                                                                                                             Patient Demographics:    [name], is a 53 y.o. male, DOB - [birth year], YYT:[id number]  Outpatient Primary MD for the patient is Sharion Balloon, FNP    LOS - 7  Admit date - 04/09/2019    Chief Complaint  Patient presents with  . Respiratory Distress       Brief Narrative -  53 year old super morbidly obese Caucasian male with history of seizures, DM type II, depression, BPH, OSA, OHS on 2 to 3 L nasal cannula oxygen at home.  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  He was admitted to ICU stabilized and transferred to hospitalist service on 04/14/2019.   Subjective:    [name] today has, No headache, No chest pain, No abdominal pain - No Nausea, No new weakness tingling or numbness, no cough.  Mild shortness of breath.   Assessment  & Plan :     1. Acute on chronic hypoxic Resp. Failure due to Acute Covid 19 Viral Pneumonitis during the ongoing 2020 Covid 19 Pandemic - he has baseline underlying obesity related hypoventilation syndrome and uses about 3 L nasal cannula at all times.  Treated this admission in ICU on IV Remdisvir and Decadron, tapering Decadron gradually, currently on 8 to 10 L nasal cannula oxygen.  When he moves he requires nonrebreather mask.  Also is developing atelectasis from his bedbound status and not moving out of bed compounded by his super morbid obesity.  Encouraged to sit up in chair in the daytime and use I-S and flutter valve for pulmonary toiletry.  Continue monitoring clinically.   COVID-19 Labs  Recent Labs    04/14/19 0343  04/15/19 0349 04/16/19 0307  DDIMER 0.40 0.40 0.45  FERRITIN 195  --   --   CRP 4.1* 6.6* 6.3*    Lab Results  Component Value Date   SARSCOV2NAA POSITIVE (A) 04/09/2019    SpO2: 91 % O2 Flow Rate (L/min): 5 L/min FiO2 (%): 87 %  Hepatic  Function Latest Ref Rng & Units 04/16/2019 04/15/2019 04/14/2019  Total Protein 6.5 - 8.1 g/dL 6.4(L) 6.5 6.5  Albumin 3.5 - 5.0 g/dL 2.1(L) 2.3(L) 2.3(L)  AST 15 - 41 U/L 41 51(H) 57(H)  ALT 0 - 44 U/L _0 Alk Phosphatase 38 - 126 U/L 73 76 75  Total Bilirubin 0.3 - 1.2 mg/dL 1.0 1.1 0.7  Bilirubin, Direct 0.0 - 0.3 mg/dL - - -        Component Value Date/Time   BNP 15.4 04/16/2019 0307      2.  OSA/OHS - currently not on CPAP at home.  3.  History of PE and DVT.  On Coumadin INR therapeutic.  Pharmacy monitoring.  4. Hx seizure - Stable, continue combination of Keppra and Lamictal.   5.  Dyslipidemia.  On Zocor.  6.  Morbid obesity BMI of 57.  Outpatient follow-up PCP for weight loss.  7.  Deconditioning.  PT OT consulted.  Sit to sit up in chair and daytime.  8.  Depression and bipolar disorder.  Continue buspirone, Cymbalta and Lyrica combination.  9.  AKI on CKD 4.  Baseline creatinine 3.5.  Initially had AKI due to ATN now improving with ongoing diuresis with oral Lasix 80 mg twice daily.  Monitor renal function closely with ongoing diuresis.  10. DM type II.  On Lantus and sliding scale combination dose adjusted 10/18.  Outpatient glycemic control is slightly poor due to hyperglycemia.  A1c was 7.9  CBG (last 3)  Recent Labs    04/15/19 1450 04/15/19 2115 04/16/19 0818  GLUCAP 324* 136* 226*     Condition -  Guarded  Family Communication  :   Updated wife [name] on 04/14/2019.  She tells me that patient is virtually bedbound and at best to transfers to bedside commode with assistance.  Splane to her that combination of DVT PE, COVID, super morbid obesity, OHS is highly life-threatening.  She understands it.   She knows his prognosis is not good.  Code Status :  Full  Diet :    Diet Order            Diet regular Room service appropriate? Yes; Fluid consistency: Honey Thick  Diet effective now               Disposition Plan  :  TBD  Consults  :  PCCM  Procedures  :    PUD Prophylaxis : PPI  DVT Prophylaxis  : Coumadin  Lab Results  Component Value Date   PLT 81 (L) 04/16/2019   Lab Results  Component Value Date   INR 3.5 (H) 04/16/2019   INR 3.2 (H) 04/15/2019   INR 2.8 (H) 04/14/2019    Inpatient Medications  Scheduled Meds: . busPIRone  15 mg Oral BID  . Chlorhexidine Gluconate Cloth  6 each Topical Daily  . dexamethasone (DECADRON) injection  4 mg Intravenous Q24H  . dextromethorphan-guaiFENesin  1 tablet Oral BID  . DULoxetine  60 mg Oral Daily  . fentaNYL  1 patch Transdermal Q72H  . furosemide  80 mg Oral BID  . insulin aspart  0-20 Units Subcutaneous TID WC  . insulin aspart  0-5 Units Subcutaneous QHS  . insulin aspart  12 Units Subcutaneous TID WC  . insulin glargine  60 Units Subcutaneous BID  . lamoTRIgine  200 mg Oral BID  . levETIRAcetam  1,000 mg Oral BID  . loratadine  10 mg Oral Daily  . pantoprazole  40 mg Oral Daily  . potassium chloride  40 mEq Oral Daily  . pregabalin  50 mg Oral TID  . simvastatin  20 mg Oral QHS  . spironolactone  25 mg Oral Daily  . tamsulosin  0.4 mg Oral QPC supper  . Warfarin - Pharmacist Dosing Inpatient   Does not apply q1800   Continuous Infusions: . sodium chloride 10 mL/hr at 04/13/19 1200   PRN Meds:.sodium chloride, acetaminophen, albuterol, guaiFENesin-dextromethorphan, HYDROcodone-acetaminophen, [DISCONTINUED] ondansetron **OR** ondansetron (ZOFRAN) IV, polyethylene glycol, Resource ThickenUp Clear, sodium chloride  Antibiotics  :    Anti-infectives (From admission, onward)   Start     Dose/Rate Route Frequency Ordered Stop   04/11/19 1000  remdesivir 100 mg in sodium chloride 0.9 % 250 mL IVPB     100  mg 500 mL/hr over 30 Minutes Intravenous Every 24 hours 04/10/19 0000 04/14/19 1035   04/10/19 0100  remdesivir 200 mg in sodium chloride 0.9 % 250 mL IVPB     200 mg 500 mL/hr over 30 Minutes Intravenous Once 04/10/19 0000 04/10/19 0138       Time Spent in minutes  30   Lala Lund M.D on 04/16/2019 at 8:53 AM  To page go to [email or web address] - password [email or web address]  [facility] -  Office  [phone]   See all Orders from today for further details    Objective:   Vitals:   04/15/19 1900 04/15/19 2120 04/16/19 0125 04/16/19 0800  BP: (!) 152/70 129/68 135/81   Pulse: (!) 103 83 77 83  Resp: (!) '22 13 12   '$ Temp: 98.7 F (37.1 C) 98.7 F (37.1 C) 98.6 F (37 C) 98.7 F (37.1 C)  TempSrc: Oral Oral Oral Axillary  SpO2: (!) 87% 92% 93% 91%  Weight:      Height:        Wt Readings from Last 3 Encounters:  04/10/19 (!) 209.6 kg  02/08/19 (!) 205.2 kg  11/03/17 (!) 205.2 kg     Intake/Output Summary (Last 24 hours) at 04/16/2019 0853 Last data filed at 04/15/2019 1935 Gross per 24 hour  Intake 240 ml  Output 2000 ml  Net -1760 ml     Physical Exam  Morbidly obese middle-aged white male lying in hospital bed in no distress, awake and alert, No new F.N deficits, flat affect [place].AT,PERRAL Supple Neck,No JVD, No cervical lymphadenopathy appriciated.  Symmetrical Chest wall movement, Good air movement bilaterally, CTAB RRR,No Gallops, Rubs or new Murmurs, No Parasternal Heave +ve B.Sounds, Abd Soft, No tenderness, No organomegaly appriciated, No rebound - guarding or rigidity. No Cyanosis, Clubbing or edema, No new Rash or bruise    Data Review:    CBC Recent Labs  Lab 04/12/19 0540 04/13/19 0615 04/14/19 0343 04/15/19 0349 04/16/19 0307  WBC 3.4* 4.0 3.8* 3.2* 2.5*  HGB 10.3* 10.5* 11.3* 11.2* 10.8*  HCT 32.4* 32.4* 34.5* 34.8* 34.3*  PLT 103* 109* 93* 86* 81*  MCV 93.1 92.8 94.3 93.0 94.2  MCH 29.6 30.1 30.9 29.9 29.7  MCHC 31.8 32.4 32.8 32.2  31.5  RDW 13.3 13.6 13.9 14.1 14.4  LYMPHSABS 0.5* 0.6* 0.6* 0.5* 0.4*  MONOABS 0.2 0.3 0.3 0.2 0.2  EOSABS 0.0 0.0 0.0 0.0 0.0  BASOSABS 0.0 0.0 0.0 0.0 0.0    Chemistries  Recent Labs  Lab 04/09/19 1331  04/12/19 0540 04/13/19 0615 04/14/19 0343 04/15/19 0349 04/16/19 0307  NA 133*   < > 132* 134* 140 144 145  K 3.4*   < >  4.1 4.2 3.9 3.6 4.2  CL 95*   < > 93* 94* 100 102 103  CO2 24   < > _0 GLUCOSE 212*   < > 277* 244* 144* 147* 238*  BUN 72*   < > 99* 108* 113* 123* PENDING  CREATININE 4.84*   < > 4.76* 4.56* 4.30* 3.97* 4.19*  CALCIUM 7.6*   < > 8.1* 8.3* 8.4* 8.1* 8.3*  MG 1.8  --   --   --   --  2.0 2.1  AST 82*   < > 77* 63* 57* 51* 41  ALT 28   < > _1 ALKPHOS 74   < > 81 79 75 76 73  BILITOT 0.6   < > 1.0 0.6 0.7 1.1 1.0   < > = values in this interval not displayed.   ------------------------------------------------------------------------------------------------------------------ No results for input(s): CHOL, HDL, LDLCALC, TRIG, CHOLHDL, LDLDIRECT in the last 72 hours.  Lab Results  Component Value Date   HGBA1C 7.9 (H) 04/10/2019   ------------------------------------------------------------------------------------------------------------------ No results for input(s): TSH, T4TOTAL, T3FREE, THYROIDAB in the last 72 hours.  Invalid input(s): FREET3  Cardiac Enzymes No results for input(s): CKMB, TROPONINI, MYOGLOBIN in the last 168 hours.  Invalid input(s): CK ------------------------------------------------------------------------------------------------------------------    Component Value Date/Time   BNP 15.4 04/16/2019 0307    Micro Results Recent Results (from the past 240 hour(s))  SARS Coronavirus 2 by RT PCR (hospital order, performed in [facility] hospital lab) Nasopharyngeal Nasopharyngeal Swab     Status: Abnormal   Collection Time: 04/09/19 12:47 PM   Specimen: Nasopharyngeal Swab  Result Value Ref Range  Status   SARS Coronavirus 2 POSITIVE (A) NEGATIVE Final    Comment: RESULT CALLED TO, READ BACK BY AND VERIFIED WITH:  A. TUTTLE,RN  _2   10/11/2020KAY (NOTE) If result is NEGATIVE SARS-CoV-2 target nucleic acids are NOT DETECTED. The SARS-CoV-2 RNA is generally detectable in upper and lower  respiratory specimens during the acute phase of infection. The lowest  concentration of SARS-CoV-2 viral copies this assay can detect is 250  copies / mL. A negative result does not preclude SARS-CoV-2 infection  and should not be used as the sole basis for treatment or other  patient management decisions.  A negative result may occur with  improper specimen collection / handling, submission of specimen other  than nasopharyngeal swab, presence of viral mutation(s) within the  areas targeted by this assay, and inadequate number of viral copies  (<250 copies / mL). A negative result must be combined with clinical  observations, patient history, and epidemiological information. If result is POSITIVE SARS-CoV-2 target nucleic acids are DETECTED. T he SARS-CoV-2 RNA is generally detectable in upper and lower  respiratory specimens during the acute phase of infection.  Positive  results are indicative of active infection with SARS-CoV-2.  Clinical  correlation with patient history and other diagnostic information is  necessary to determine patient infection status.  Positive results do  not rule out bacterial infection or co-infection with other viruses. If result is PRESUMPTIVE POSTIVE SARS-CoV-2 nucleic acids MAY BE PRESENT.   A presumptive positive result was obtained on the submitted specimen  and confirmed on repeat testing.  While 2019 novel coronavirus  (SARS-CoV-2) nucleic acids may be present in the submitted sample  additional confirmatory testing may be necessary for epidemiological  and / or clinical management purposes  to differentiate between  SARS-CoV-2 and other Sarbecovirus  currently known to infect humans.  If clinically indicated additional testing with an alternate test  methodology [phone]) is  advised. The SARS-CoV-2 RNA is generally  detectable in upper and lower respiratory specimens during the acute  phase of infection. The expected result is Negative. Fact Sheet for Patients:  StrictlyIdeas.no Fact Sheet for Healthcare Providers: BankingDealers.co.za This test is not yet approved or cleared by the [place] FDA and has been authorized for detection and/or diagnosis of SARS-CoV-2 by FDA under an Emergency Use Authorization (EUA).  This EUA will remain in effect (meaning this test can be used) for the duration of the COVID-19 declaration under Section 564(b)(1) of the Act, 21 U.S.C. section 360bbb-3(b)(1), unless the authorization is terminated or revoked sooner. Performed at [facility], [address], [place], [place] [address]   Blood Culture (routine x 2)     Status: None   Collection Time: 04/09/19  2:25 PM   Specimen: Left Antecubital; Blood  Result Value Ref Range Status   Specimen Description   Final    LEFT ANTECUBITAL BOTTLES DRAWN AEROBIC AND ANAEROBIC   Special Requests Blood Culture adequate volume  Final   Culture   Final    NO GROWTH 5 DAYS Performed at [facility], [address] [place], [place] [address]    Report Status 04/14/2019 FINAL  Final  Blood Culture (routine x 2)     Status: None   Collection Time: 04/09/19  2:26 PM   Specimen: BLOOD LEFT HAND  Result Value Ref Range Status   Specimen Description   Final    BLOOD LEFT HAND BOTTLES DRAWN AEROBIC AND ANAEROBIC   Special Requests Blood Culture adequate volume  Final   Culture   Final    NO GROWTH 5 DAYS Performed at [facility], [address], [place], [place] [address]    Report Status 04/14/2019 FINAL  Final  MRSA PCR Screening     Status: None   Collection Time: 04/09/19 11:55 PM   Specimen:  Nasopharyngeal  Result Value Ref Range Status   MRSA by PCR NEGATIVE NEGATIVE Final    Comment:        The GeneXpert MRSA Assay (FDA approved for NASAL specimens only), is one component of a comprehensive MRSA colonization surveillance program. It is not intended to diagnose MRSA infection nor to guide or monitor treatment for MRSA infections. Performed at [facility], [place] [address], [place], [place] [address]     Radiology Reports Dg Chest Port 1 View  Result Date: 04/09/2019 CLINICAL DATA:  Onset respiratory distress today. COVID-19 exposure. EXAM: PORTABLE CHEST 1 VIEW COMPARISON:  PA and lateral chest 01/26/2019.  CT chest 12/28/2018. FINDINGS: Lung volumes are low. There is patchy bilateral airspace disease. Heart size is normal. No pneumothorax or pleural effusion. IMPRESSION: Patchy bilateral airspace disease worrisome for pneumonia including atypical/viral infection. Electronically Signed   By: Inge Rise M.D.   On: 04/09/2019 13:51

## 2019-04-17 DIAGNOSIS — R0902 Hypoxemia: Secondary | ICD-10-CM | POA: Diagnosis not present

## 2019-04-17 LAB — CBC WITH DIFFERENTIAL/PLATELET
Abs Immature Granulocytes: 0.02 10*3/uL (ref 0.00–0.07)
Basophils Absolute: 0 10*3/uL (ref 0.0–0.1)
Basophils Relative: 0 %
Eosinophils Absolute: 0.1 10*3/uL (ref 0.0–0.5)
Eosinophils Relative: 4 %
HCT: 35 % — ABNORMAL LOW (ref 39.0–52.0)
Hemoglobin: 11 g/dL — ABNORMAL LOW (ref 13.0–17.0)
Immature Granulocytes: 1 %
Lymphocytes Relative: 20 %
Lymphs Abs: 0.6 10*3/uL — ABNORMAL LOW (ref 0.7–4.0)
MCH: 29.8 pg (ref 26.0–34.0)
MCHC: 31.4 g/dL (ref 30.0–36.0)
MCV: 94.9 fL (ref 80.0–100.0)
Monocytes Absolute: 0.3 10*3/uL (ref 0.1–1.0)
Monocytes Relative: 9 %
Neutro Abs: 2 10*3/uL (ref 1.7–7.7)
Neutrophils Relative %: 66 %
Platelets: 82 10*3/uL — ABNORMAL LOW (ref 150–400)
RBC: 3.69 MIL/uL — ABNORMAL LOW (ref 4.22–5.81)
RDW: 14.2 % (ref 11.5–15.5)
WBC: 3 10*3/uL — ABNORMAL LOW (ref 4.0–10.5)
nRBC: 0 % (ref 0.0–0.2)

## 2019-04-17 LAB — POCT I-STAT 7, (LYTES, BLD GAS, ICA,H+H)
Acid-Base Excess: 7 mmol/L — ABNORMAL HIGH (ref 0.0–2.0)
Bicarbonate: 30.5 mmol/L — ABNORMAL HIGH (ref 20.0–28.0)
Calcium, Ion: 1.07 mmol/L — ABNORMAL LOW (ref 1.15–1.40)
HCT: 32 % — ABNORMAL LOW (ref 39.0–52.0)
Hemoglobin: 10.9 g/dL — ABNORMAL LOW (ref 13.0–17.0)
O2 Saturation: 87 %
Patient temperature: 36.7
Potassium: 3.4 mmol/L — ABNORMAL LOW (ref 3.5–5.1)
Sodium: 141 mmol/L (ref 135–145)
TCO2: 32 mmol/L (ref 22–32)
pCO2 arterial: 39.5 mmHg (ref 32.0–48.0)
pH, Arterial: 7.495 — ABNORMAL HIGH (ref 7.350–7.450)
pO2, Arterial: 48 mmHg — ABNORMAL LOW (ref 83.0–108.0)

## 2019-04-17 LAB — COMPREHENSIVE METABOLIC PANEL
ALT: 24 U/L (ref 0–44)
AST: 32 U/L (ref 15–41)
Albumin: 2.3 g/dL — ABNORMAL LOW (ref 3.5–5.0)
Alkaline Phosphatase: 70 U/L (ref 38–126)
Anion gap: 13 (ref 5–15)
BUN: 111 mg/dL — ABNORMAL HIGH (ref 6–20)
CO2: 29 mmol/L (ref 22–32)
Calcium: 8.1 mg/dL — ABNORMAL LOW (ref 8.9–10.3)
Chloride: 98 mmol/L (ref 98–111)
Creatinine, Ser: 3.91 mg/dL — ABNORMAL HIGH (ref 0.61–1.24)
GFR calc Af Amer: 19 mL/min — ABNORMAL LOW (ref >60)
GFR calc non Af Amer: 17 mL/min — ABNORMAL LOW (ref >60)
Glucose, Bld: 158 mg/dL — ABNORMAL HIGH (ref 70–99)
Potassium: 4 mmol/L (ref 3.5–5.1)
Sodium: 140 mmol/L (ref 135–145)
Total Bilirubin: 1.2 mg/dL (ref 0.3–1.2)
Total Protein: 6.4 g/dL — ABNORMAL LOW (ref 6.5–8.1)

## 2019-04-17 LAB — GLUCOSE, CAPILLARY
Glucose-Capillary: 102 mg/dL — ABNORMAL HIGH (ref 70–99)
Glucose-Capillary: 335 mg/dL — ABNORMAL HIGH (ref 70–99)
Glucose-Capillary: 145 mg/dL — ABNORMAL HIGH (ref 70–99)
Glucose-Capillary: 181 mg/dL — ABNORMAL HIGH (ref 70–99)
Glucose-Capillary: 177 mg/dL — ABNORMAL HIGH (ref 70–99)
Glucose-Capillary: 173 mg/dL — ABNORMAL HIGH (ref 70–99)

## 2019-04-17 LAB — PROTIME-INR
INR: 3.3 — ABNORMAL HIGH (ref 0.8–1.2)
Prothrombin Time: 33.1 seconds — ABNORMAL HIGH (ref 11.4–15.2)

## 2019-04-17 LAB — MAGNESIUM: Magnesium: 2.1 mg/dL (ref 1.7–2.4)

## 2019-04-17 LAB — C-REACTIVE PROTEIN: CRP: 4.3 mg/dL — ABNORMAL HIGH (ref <1.0)

## 2019-04-17 LAB — BRAIN NATRIURETIC PEPTIDE: B Natriuretic Peptide: 9.3 pg/mL (ref 0.0–100.0)

## 2019-04-17 LAB — D-DIMER, QUANTITATIVE: D-Dimer, Quant: 0.48 ug/mL-FEU (ref 0.00–0.50)

## 2019-04-17 NOTE — Plan of Care (Signed)
Patient currently on 15HFNC (low 90s at best).  ABGs drawn and resulted with marginal results.  Dr. And ICU chrg aware.  Will get him in chair w/ lift and continue to assess.  Also placing "Y" in room to give NRB in addition if needed.

## 2019-04-17 NOTE — Plan of Care (Addendum)
RN attempted all means to get patient in chair - with NO success.  Patient can only use [facility] lift d/t weight/size.  Unfotunateley, prior sling got soiled and was sent for cleaning.  These slings are not stocked and only come from [facility].  AC notified sizewise, but cannot deliver another sling till 10/20. Prior sling also will not be cleaned and returned for use, till 10/20.Apparently, patient cannot be safely placed in chair until 10/20.  Therefore, placed in CHAIR position in sizewise bariatric bed. Bed is automatically rotating position to assist in preventing pressure ulcers and skin breakdown. Currently maintaining on 15L HFNC (low 90s). Patient shown how to use incentive spirometer and demonstrated understanding via teachback. Staff will continue to monitor and assist as needed.

## 2019-04-17 NOTE — Progress Notes (Signed)
PROGRESS NOTE                                                                                                                                                                                                             Patient Demographics:    [name], is a 53 y.o. male, DOB - 06/17/1966, SUP:[id number]  Outpatient Primary MD for the patient is Sharion Balloon, FNP    LOS - 8  Admit date - 04/09/2019    Chief Complaint  Patient presents with  . Respiratory Distress       Brief Narrative -  53 year old super morbidly obese Caucasian male with history of seizures, DM type II, depression, BPH, OSA, OHS on 2 to 3 L nasal cannula oxygen at home.  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  He was admitted to ICU stabilized and transferred to hospitalist service on 04/14/2019.   Subjective:   Patient in bed, appears comfortable, denies any headache, no fever, no chest pain or pressure, no shortness of breath , no abdominal pain. No focal weakness.    Assessment  & Plan :     1. Acute on chronic hypoxic Resp. Failure due to Acute Covid 19 Viral Pneumonitis during the ongoing 2020 Covid 19 Pandemic - he has baseline underlying obesity related hypoventilation syndrome and uses about 3 L nasal cannula at all times.  Treated this admission in ICU on IV Remdisvir and Decadron, tapering Decadron gradually, currently on 8 to 10 L nasal cannula oxygen.  When he moves he requires nonrebreather mask.  Also is developing atelectasis from his bedbound status and not moving out of bed compounded by his super morbid obesity.  Encouraged to sit up in chair in the daytime and use I-S and flutter valve for pulmonary toiletry.  Continue monitoring clinically, he remains noncompliant with suggestions kindly see nursing  notes, counseled.   COVID-19 Labs  Recent Labs    04/15/19 0349 04/16/19 0307 04/17/19 0330  DDIMER 0.40 0.45 0.48  CRP 6.6* 6.3* 4.3*    Lab Results  Component Value Date   SARSCOV2NAA POSITIVE (A) 04/09/2019    SpO2: (!) 88 % O2 Flow Rate (L/min): 15 L/min FiO2 (%): 87 %  Hepatic  Function Latest Ref Rng & Units 04/17/2019 04/16/2019 04/15/2019  Total Protein 6.5 - 8.1 g/dL 6.4(L) 6.4(L) 6.5  Albumin 3.5 - 5.0 g/dL 2.3(L) 2.1(L) 2.3(L)  AST 15 - 41 U/L 32 41 51(H)  ALT 0 - 44 U/L 24 26 29   Alk Phosphatase 38 - 126 U/L 70 73 76  Total Bilirubin 0.3 - 1.2 mg/dL 1.2 1.0 1.1  Bilirubin, Direct 0.0 - 0.3 mg/dL - - -        Component Value Date/Time   BNP 9.3 04/17/2019 0330      2.  OSA/OHS - currently not on CPAP at home.  3.  History of PE and DVT.  On Coumadin INR therapeutic.  Pharmacy monitoring.  4. Hx seizure - Stable, continue combination of Keppra and Lamictal.   5.  Dyslipidemia.  On Zocor.  6.  Morbid obesity BMI of 57.  Outpatient follow-up PCP for weight loss.  7.  Deconditioning.  PT OT consulted.  Sit to sit up in chair and daytime.  8.  Depression and bipolar disorder.  Continue buspirone, Cymbalta and Lyrica combination.  9.  AKI on CKD 4.  Baseline creatinine 3.5.  Initially had AKI due to ATN now improving with ongoing diuresis with oral Lasix 80 mg twice daily.  Monitor renal function closely with ongoing diuresis.  10. DM type II.  On Lantus and sliding scale combination dose adjusted 10/18.  Outpatient glycemic control is slightly poor due to hyperglycemia.  A1c was 7.9  CBG (last 3)  Recent Labs    04/16/19 1552 04/16/19 2112 04/17/19 0828  GLUCAP 242* 281* 145*     Condition -  Guarded  Family Communication  :   Updated wife [name] on 04/14/2019.  She tells me that patient is virtually bedbound and at best to transfers to bedside commode with assistance.  Splane to her that combination of DVT PE, COVID, super morbid obesity, OHS  is highly life-threatening.  She understands it.  She knows his prognosis is not good.  Code Status :  Full  Diet :    Diet Order            Diet regular Room service appropriate? Yes; Fluid consistency: Honey Thick  Diet effective now               Disposition Plan  :  TBD  Consults  :  PCCM  Procedures  :    PUD Prophylaxis : PPI  DVT Prophylaxis  : Coumadin  Lab Results  Component Value Date   PLT 82 (L) 04/17/2019   Lab Results  Component Value Date   INR 3.3 (H) 04/17/2019   INR 3.5 (H) 04/16/2019   INR 3.2 (H) 04/15/2019    Inpatient Medications  Scheduled Meds: . busPIRone  15 mg Oral BID  . Chlorhexidine Gluconate Cloth  6 each Topical Daily  . dexamethasone (DECADRON) injection  2 mg Intravenous Q24H  . dextromethorphan-guaiFENesin  1 tablet Oral BID  . DULoxetine  60 mg Oral Daily  . fentaNYL  1 patch Transdermal Q72H  . furosemide  80 mg Oral BID  . insulin aspart  0-20 Units Subcutaneous TID WC  . insulin aspart  0-5 Units Subcutaneous QHS  . insulin aspart  12 Units Subcutaneous TID WC  . insulin glargine  65 Units Subcutaneous BID  . lamoTRIgine  200 mg Oral BID  . levETIRAcetam  1,000 mg Oral BID  . loratadine  10 mg Oral Daily  . pantoprazole  40 mg Oral Daily  . potassium chloride  40 mEq Oral Daily  . pregabalin  50 mg Oral TID  . simvastatin  20 mg Oral QHS  . spironolactone  25 mg Oral Daily  . tamsulosin  0.4 mg Oral QPC supper  . Warfarin - Pharmacist Dosing Inpatient   Does not apply q1800   Continuous Infusions: . sodium chloride 10 mL/hr at 04/13/19 1200   PRN Meds:.sodium chloride, acetaminophen, albuterol, guaiFENesin-dextromethorphan, HYDROcodone-acetaminophen, [DISCONTINUED] ondansetron **OR** ondansetron (ZOFRAN) IV, polyethylene glycol, Resource ThickenUp Clear, sodium chloride  Antibiotics  :    Anti-infectives (From admission, onward)   Start     Dose/Rate Route Frequency Ordered Stop   04/11/19 1000  remdesivir  100 mg in sodium chloride 0.9 % 250 mL IVPB     100 mg 500 mL/hr over 30 Minutes Intravenous Every 24 hours 04/10/19 0000 04/14/19 1035   04/10/19 0100  remdesivir 200 mg in sodium chloride 0.9 % 250 mL IVPB     200 mg 500 mL/hr over 30 Minutes Intravenous Once 04/10/19 0000 04/10/19 0138       Time Spent in minutes  30   Lala Lund M.D on 04/17/2019 at 9:56 AM  To page go to [email or web address] - password [email or web address]  [facility] -  Office  [phone]   See all Orders from today for further details    Objective:   Vitals:   04/17/19 0017 04/17/19 0300 04/17/19 0400 04/17/19 0719  BP: (!) 136/59 128/76 136/73   Pulse: 87 85 84   Resp:      Temp: 97.9 F (36.6 C)  98.6 F (37 C)   TempSrc: Oral  Oral   SpO2: 90% (!) 89% (!) 87% (!) 88%  Weight:      Height:        Wt Readings from Last 3 Encounters:  04/10/19 (!) 209.6 kg  02/08/19 (!) 205.2 kg  11/03/17 (!) 205.2 kg     Intake/Output Summary (Last 24 hours) at 04/17/2019 0956 Last data filed at 04/17/2019 0400 Gross per 24 hour  Intake 3320 ml  Output 3000 ml  Net 320 ml     Physical Exam  Morbidly obese middle-aged white male lying in hospital bed in no distress, awake and alert, No new F.N deficits, flat affect Olivia.AT,PERRAL Supple Neck,No JVD, No cervical lymphadenopathy appriciated.  Symmetrical Chest wall movement, Good air movement bilaterally, CTAB RRR,No Gallops, Rubs or new Murmurs, No Parasternal Heave +ve B.Sounds, Abd Soft, No tenderness, No organomegaly appriciated, No rebound - guarding or rigidity. No Cyanosis, Clubbing, trace edema, No new Rash or bruise   Data Review:    CBC Recent Labs  Lab 04/13/19 0615 04/14/19 0343 04/15/19 0349 04/16/19 0307 04/17/19 0330  WBC 4.0 3.8* 3.2* 2.5* 3.0*  HGB 10.5* 11.3* 11.2* 10.8* 11.0*  HCT 32.4* 34.5* 34.8* 34.3* 35.0*  PLT 109* 93* 86* 81* 82*  MCV 92.8 94.3 93.0 94.2 94.9  MCH 30.1 30.9 29.9 29.7 29.8  MCHC 32.4 32.8 32.2 31.5  31.4  RDW 13.6 13.9 14.1 14.4 14.2  LYMPHSABS 0.6* 0.6* 0.5* 0.4* 0.6*  MONOABS 0.3 0.3 0.2 0.2 0.3  EOSABS 0.0 0.0 0.0 0.0 0.1  BASOSABS 0.0 0.0 0.0 0.0 0.0    Chemistries  Recent Labs  Lab 04/13/19 0615 04/14/19 0343 04/15/19 0349 04/16/19 0307 04/17/19 0330  NA 134* 140 144 145 140  K 4.2 3.9 3.6 4.2 4.0  CL 94* 100 102 103 98  CO2 26 29 30  31  29  GLUCOSE 244* 144* 147* 238* 158*  BUN 108* 113* 123* 104* 111*  CREATININE 4.56* 4.30* 3.97* 4.19* 3.91*  CALCIUM 8.3* 8.4* 8.1* 8.3* 8.1*  MG  --   --  2.0 2.1 2.1  AST 63* 57* 51* 41 32  ALT 31 31 29 26 24   ALKPHOS 79 75 76 73 70  BILITOT 0.6 0.7 1.1 1.0 1.2   ------------------------------------------------------------------------------------------------------------------ No results for input(s): CHOL, HDL, LDLCALC, TRIG, CHOLHDL, LDLDIRECT in the last 72 hours.  Lab Results  Component Value Date   HGBA1C 7.9 (H) 04/10/2019   ------------------------------------------------------------------------------------------------------------------ No results for input(s): TSH, T4TOTAL, T3FREE, THYROIDAB in the last 72 hours.  Invalid input(s): FREET3  Cardiac Enzymes No results for input(s): CKMB, TROPONINI, MYOGLOBIN in the last 168 hours.  Invalid input(s): CK ------------------------------------------------------------------------------------------------------------------    Component Value Date/Time   BNP 9.3 04/17/2019 0330    Micro Results Recent Results (from the past 240 hour(s))  SARS Coronavirus 2 by RT PCR (hospital order, performed in [facility] hospital lab) Nasopharyngeal Nasopharyngeal Swab     Status: Abnormal   Collection Time: 04/09/19 12:47 PM   Specimen: Nasopharyngeal Swab  Result Value Ref Range Status   SARS Coronavirus 2 POSITIVE (A) NEGATIVE Final    Comment: RESULT CALLED TO, READ BACK BY AND VERIFIED WITH:  A. TUTTLE,RN  @1341   10/11/2020KAY (NOTE) If result is NEGATIVE SARS-CoV-2  target nucleic acids are NOT DETECTED. The SARS-CoV-2 RNA is generally detectable in upper and lower  respiratory specimens during the acute phase of infection. The lowest  concentration of SARS-CoV-2 viral copies this assay can detect is 250  copies / mL. A negative result does not preclude SARS-CoV-2 infection  and should not be used as the sole basis for treatment or other  patient management decisions.  A negative result may occur with  improper specimen collection / handling, submission of specimen other  than nasopharyngeal swab, presence of viral mutation(s) within the  areas targeted by this assay, and inadequate number of viral copies  (<250 copies / mL). A negative result must be combined with clinical  observations, patient history, and epidemiological information. If result is POSITIVE SARS-CoV-2 target nucleic acids are DETECTED. T he SARS-CoV-2 RNA is generally detectable in upper and lower  respiratory specimens during the acute phase of infection.  Positive  results are indicative of active infection with SARS-CoV-2.  Clinical  correlation with patient history and other diagnostic information is  necessary to determine patient infection status.  Positive results do  not rule out bacterial infection or co-infection with other viruses. If result is PRESUMPTIVE POSTIVE SARS-CoV-2 nucleic acids MAY BE PRESENT.   A presumptive positive result was obtained on the submitted specimen  and confirmed on repeat testing.  While 2019 novel coronavirus  (SARS-CoV-2) nucleic acids may be present in the submitted sample  additional confirmatory testing may be necessary for epidemiological  and / or clinical management purposes  to differentiate between  SARS-CoV-2 and other Sarbecovirus currently known to infect humans.  If clinically indicated additional testing with an alternate test  methodology [phone]) is  advised. The SARS-CoV-2 RNA is generally  detectable in upper and lower  respiratory specimens during the acute  phase of infection. The expected result is Negative. Fact Sheet for Patients:  StrictlyIdeas.no Fact Sheet for Healthcare Providers: BankingDealers.co.za This test is not yet approved or cleared by the [place] FDA and has been authorized for detection and/or diagnosis of SARS-CoV-2 by FDA under an  Emergency Use Authorization (EUA).  This EUA will remain in effect (meaning this test can be used) for the duration of the COVID-19 declaration under Section 564(b)(1) of the Act, 21 U.S.C. section 360bbb-3(b)(1), unless the authorization is terminated or revoked sooner. Performed at [facility], [address], [place], [place] [address]   Blood Culture (routine x 2)     Status: None   Collection Time: 04/09/19  2:25 PM   Specimen: Left Antecubital; Blood  Result Value Ref Range Status   Specimen Description   Final    LEFT ANTECUBITAL BOTTLES DRAWN AEROBIC AND ANAEROBIC   Special Requests Blood Culture adequate volume  Final   Culture   Final    NO GROWTH 5 DAYS Performed at [facility], [address] [place], [place] [address]    Report Status 04/14/2019 FINAL  Final  Blood Culture (routine x 2)     Status: None   Collection Time: 04/09/19  2:26 PM   Specimen: BLOOD LEFT HAND  Result Value Ref Range Status   Specimen Description   Final    BLOOD LEFT HAND BOTTLES DRAWN AEROBIC AND ANAEROBIC   Special Requests Blood Culture adequate volume  Final   Culture   Final    NO GROWTH 5 DAYS Performed at [facility], [address] [place], [place] [address]    Report Status 04/14/2019 FINAL  Final  MRSA PCR Screening     Status: None   Collection Time: 04/09/19 11:55 PM   Specimen: Nasopharyngeal  Result Value Ref Range Status   MRSA by PCR NEGATIVE NEGATIVE Final    Comment:        The GeneXpert MRSA Assay (FDA approved for NASAL specimens only), is one component of a comprehensive MRSA  colonization surveillance program. It is not intended to diagnose MRSA infection nor to guide or monitor treatment for MRSA infections. Performed at [facility], [place] [address], [place], [place] [address]     Radiology Reports Dg Chest Port 1 View  Result Date: 04/09/2019 CLINICAL DATA:  Onset respiratory distress today. COVID-19 exposure. EXAM: PORTABLE CHEST 1 VIEW COMPARISON:  PA and lateral chest 01/26/2019.  CT chest 12/28/2018. FINDINGS: Lung volumes are low. There is patchy bilateral airspace disease. Heart size is normal. No pneumothorax or pleural effusion. IMPRESSION: Patchy bilateral airspace disease worrisome for pneumonia including atypical/viral infection. Electronically Signed   By: Inge Rise M.D.   On: 04/09/2019 13:51

## 2019-04-17 NOTE — Progress Notes (Signed)
  Speech Language Pathology Treatment: Dysphagia  Patient Details Name: [name] MRN: [id number] DOB: 07-22-1965 Today's Date: 04/17/2019 Time: [id number] SLP Time Calculation (min) (ACUTE ONLY): 31 min  Assessment / Plan / Recommendation Clinical Impression  F/u after swallow assessment. Chart review reveals ongoing coughing when pt allows ice chips to melt; pt verbalized to nurse his understanding of aspiration, but tends to ignore precautions.  Today, pt is back on HFNC at 15 liters,  Sp02 around 92% during our session.  He continues to tolerate honey-thick liquids with no overt s/s of aspiration.  Thin liquids elicit consistent coughing response.  Pt taught to use breath hold immediately before swallowing (supra-glottic swallow) in an effort to improve airway closure at glottis before the swallow.  Pt required intermittent verbal cues to focus and coordinate the swallow response.  With cues, he reduced frequency of cough post-swallow.  Explained that while respiratory status is still compromised, it will be safer to remain on honey-thick liquids; reminded pt that drinking thick liquids is a temporary solution and that I anticipate improvements in swallowing as respiratory/medical condition improves.  For today, continue regular diet with honey-thick liquids.  SLP will follow for education/strategies to improve airway.  MBS will not be possible given body habitus.    HPI HPI: 53 year old super morbidly obese Caucasian male with history of seizures, DM type II, depression, BPH, OSA, OHS on 2 to 3 L nasal cannula oxygen at home.  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        SLP Plan  Continue with current plan of care       Recommendations  Diet recommendations: Regular;Honey-thick liquid Liquids provided via: Cup Supervision: Patient able to self  feed Compensations: Slow rate;Small sips/bites                Oral Care Recommendations: Oral care BID Follow up Recommendations: 24 hour supervision/assistance SLP Visit Diagnosis: Dysphagia, oropharyngeal phase (R13.12) Plan: Continue with current plan of care       GO                [name] 04/17/2019, 11:53 AM  [name] L. Tivis Ringer, [place] Office number [phone] Pager [phone]

## 2019-04-17 NOTE — Plan of Care (Signed)

## 2019-04-17 NOTE — Progress Notes (Addendum)
Pt. Continues to be non compliant with most treatment he calls it being "hard headed" pt. Educated on large fluid intake while on lasix and thicken liquids, pt. Request multiple cups of ice and lets them melt in order to drink water he then coughs excessively and desats. Pt stated "I've always had a hard time following medical orders and advice" Drinking multiple thicken juices, educated pt on his diabetes status and that the juice are possibly contributing to his elevated blood sugars, encouraged pt to eat solid food versus  Trying to quench his hunger with liquids.  Pt also educated on the benefits of sitting up in the chair verses laying in bed. Will continue to monitor and educated pt in order to receive the best possible outcome. Foley intact pt with 3014ml out so far this shift. O2 requirements 15L HFNC sating 86-90%, pt stated normal O2 is 88-90% when home on oxygen 2-3L

## 2019-04-17 NOTE — Progress Notes (Signed)
ANTICOAGULATION CONSULT NOTE - Follow Up Consult  Pharmacy Consult for warfarin Indication: history of pulmonary embolus and DVT  Allergies  Allergen Reactions  . Bee Venom Anaphylaxis, Shortness Of Breath and Swelling  . Other Shortness Of Breath    Itching, rash with IVP DYE, iodine, shellfish LATEX  . Penicillins Anaphylaxis and Shortness Of Breath    Has patient had a PCN reaction causing immediate rash, facial/tongue/throat swelling, SOB or lightheadedness with hypotension: Yes Has patient had a PCN reaction causing severe rash involving mucus membranes or skin necrosis: No Has patient had a PCN reaction that required hospitalization No Has patient had a PCN reaction occurring within the last 10 years: No If all of the above answers are "NO", then may proceed with Cephalosporin use.   . Shellfish Allergy Nausea And Vomiting and Other (See Comments)    Feels like insides are twisting  . Iodinated Diagnostic Agents     Other reaction(s): RASH  . Iohexol      Code: RASH, Desc: PT WAS ON PREDNISONE FOR GOUT TX. @ TIME OF SCAN AND RECEIVED 50 MG OF BENADRYL IV-ARS 10/08/07   . Iodine Rash  . Latex Rash    Patient Measurements: Height: 6\' 3"  (190.5 cm) Weight: (!) 462 lb (209.6 kg) IBW/kg (Calculated) : 84.5   Vital Signs: Temp: 98.6 F (37 C) (10/19 0400) Temp Source: Oral (10/19 0400) BP: 136/73 (10/19 0400) Pulse Rate: 84 (10/19 0400)  Labs: Recent Labs    04/15/19 0349 04/16/19 0307 04/17/19 0330 04/17/19 1038  HGB 11.2* 10.8* 11.0* 10.9*  HCT 34.8* 34.3* 35.0* 32.0*  PLT 86* 81* 82*  --   LABPROT 32.1* 34.6* 33.1*  --   INR 3.2* 3.5* 3.3*  --   CREATININE 3.97* 4.19* 3.91*  --     Estimated Creatinine Clearance: 42 mL/min (A) (by C-G formula based on SCr of 3.91 mg/dL (H)).  Medications:  Scheduled:  . busPIRone  15 mg Oral BID  . Chlorhexidine Gluconate Cloth  6 each Topical Daily  . dexamethasone (DECADRON) injection  2 mg Intravenous Q24H  .  dextromethorphan-guaiFENesin  1 tablet Oral BID  . DULoxetine  60 mg Oral Daily  . fentaNYL  1 patch Transdermal Q72H  . furosemide  80 mg Oral BID  . insulin aspart  0-20 Units Subcutaneous TID WC  . insulin aspart  0-5 Units Subcutaneous QHS  . insulin aspart  12 Units Subcutaneous TID WC  . insulin glargine  65 Units Subcutaneous BID  . lamoTRIgine  200 mg Oral BID  . levETIRAcetam  1,000 mg Oral BID  . loratadine  10 mg Oral Daily  . pantoprazole  40 mg Oral Daily  . potassium chloride  40 mEq Oral Daily  . pregabalin  50 mg Oral TID  . simvastatin  20 mg Oral QHS  . spironolactone  25 mg Oral Daily  . tamsulosin  0.4 mg Oral QPC supper  . Warfarin - Pharmacist Dosing Inpatient   Does not apply q1800    Assessment: 53 yo male admitted on 04/09/2019 for SOB and found to be positive for COVID-19. Pharmacy is consulted to dose warfarin for history of DVT and PE. Patient is on warfarin prior to admission. On admission, INR was supratherapeutic and warfarin was been held for three days (10/11-10/13). Warfarin was resumed on 10/14 when INR was therapeutic.   Today, INR remains elevated but decreased to 3.3.  Possibly related to drug interaction and poor oral intake (RN reports only  liquid intake, no solid foods). H/H low stable. Platelets down to 82 (baseline ~100-120k).   Significant drug interactions: Dexamethasone  Prior to Admission Warfarin Regimen: warfarin 5mg  daily  Goal of Therapy:  INR 2-3 Monitor platelets by anticoagulation protocol: Yes   Plan:  Hold warfarin today  Monitor daily INR, CBC, and S/S of bleeding   Gretta Arab PharmD, BCPS Clinical pharmacist phone 7am- 5pm: [phone] 04/17/2019 2:00 PM

## 2019-04-17 NOTE — Care Management (Signed)
Spoke w patient's wife to follow up on weekend handoff for [facility] services. She would like [facility] for [facility] services, however upon entering notes I see patient has decompensated with increased oxygen demand. Shari Prows [facility] was made aware of preference, and they can't take now but will look at him closer to [place] if he is still appropriate at that time for home.

## 2019-04-18 ENCOUNTER — Ambulatory Visit: Payer: Medicare Other | Admitting: Endocrinology

## 2019-04-18 ENCOUNTER — Other Ambulatory Visit: Payer: Self-pay | Admitting: Family

## 2019-04-18 DIAGNOSIS — R0902 Hypoxemia: Secondary | ICD-10-CM | POA: Diagnosis not present

## 2019-04-18 LAB — COMPREHENSIVE METABOLIC PANEL
ALT: 23 U/L (ref 0–44)
AST: 31 U/L (ref 15–41)
Albumin: 2.2 g/dL — ABNORMAL LOW (ref 3.5–5.0)
Alkaline Phosphatase: 64 U/L (ref 38–126)
Anion gap: 11 (ref 5–15)
BUN: 97 mg/dL — ABNORMAL HIGH (ref 6–20)
CO2: 32 mmol/L (ref 22–32)
Calcium: 8.1 mg/dL — ABNORMAL LOW (ref 8.9–10.3)
Chloride: 98 mmol/L (ref 98–111)
Creatinine, Ser: 3.75 mg/dL — ABNORMAL HIGH (ref 0.61–1.24)
GFR calc Af Amer: 20 mL/min — ABNORMAL LOW (ref >60)
GFR calc non Af Amer: 17 mL/min — ABNORMAL LOW (ref >60)
Glucose, Bld: 166 mg/dL — ABNORMAL HIGH (ref 70–99)
Potassium: 4.3 mmol/L (ref 3.5–5.1)
Sodium: 141 mmol/L (ref 135–145)
Total Bilirubin: 1.2 mg/dL (ref 0.3–1.2)
Total Protein: 6.4 g/dL — ABNORMAL LOW (ref 6.5–8.1)

## 2019-04-18 LAB — CBC WITH DIFFERENTIAL/PLATELET
Abs Immature Granulocytes: 0.02 10*3/uL (ref 0.00–0.07)
Basophils Absolute: 0 10*3/uL (ref 0.0–0.1)
Basophils Relative: 0 %
Eosinophils Absolute: 0 10*3/uL (ref 0.0–0.5)
Eosinophils Relative: 1 %
HCT: 34.3 % — ABNORMAL LOW (ref 39.0–52.0)
Hemoglobin: 10.8 g/dL — ABNORMAL LOW (ref 13.0–17.0)
Immature Granulocytes: 1 %
Lymphocytes Relative: 16 %
Lymphs Abs: 0.5 10*3/uL — ABNORMAL LOW (ref 0.7–4.0)
MCH: 30 pg (ref 26.0–34.0)
MCHC: 31.5 g/dL (ref 30.0–36.0)
MCV: 95.3 fL (ref 80.0–100.0)
Monocytes Absolute: 0.3 10*3/uL (ref 0.1–1.0)
Monocytes Relative: 9 %
Neutro Abs: 2.1 10*3/uL (ref 1.7–7.7)
Neutrophils Relative %: 73 %
Platelets: 75 10*3/uL — ABNORMAL LOW (ref 150–400)
RBC: 3.6 MIL/uL — ABNORMAL LOW (ref 4.22–5.81)
RDW: 13.9 % (ref 11.5–15.5)
WBC: 2.9 10*3/uL — ABNORMAL LOW (ref 4.0–10.5)
nRBC: 0 % (ref 0.0–0.2)

## 2019-04-18 LAB — GLUCOSE, CAPILLARY
Glucose-Capillary: 293 mg/dL — ABNORMAL HIGH (ref 70–99)
Glucose-Capillary: 133 mg/dL — ABNORMAL HIGH (ref 70–99)
Glucose-Capillary: 136 mg/dL — ABNORMAL HIGH (ref 70–99)
Glucose-Capillary: 95 mg/dL (ref 70–99)
Glucose-Capillary: 182 mg/dL — ABNORMAL HIGH (ref 70–99)

## 2019-04-18 LAB — D-DIMER, QUANTITATIVE: D-Dimer, Quant: 0.42 ug/mL-FEU (ref 0.00–0.50)

## 2019-04-18 LAB — PROTIME-INR
INR: 2.6 — ABNORMAL HIGH (ref 0.8–1.2)
Prothrombin Time: 27.8 seconds — ABNORMAL HIGH (ref 11.4–15.2)

## 2019-04-18 LAB — BRAIN NATRIURETIC PEPTIDE: B Natriuretic Peptide: 18.1 pg/mL (ref 0.0–100.0)

## 2019-04-18 LAB — C-REACTIVE PROTEIN: CRP: 2.3 mg/dL — ABNORMAL HIGH (ref <1.0)

## 2019-04-18 LAB — MAGNESIUM: Magnesium: 2.1 mg/dL (ref 1.7–2.4)

## 2019-04-18 MED ORDER — INSULIN GLARGINE 100 UNIT/ML ~~LOC~~ SOLN
60.0000 [IU] | Freq: Two times a day (BID) | SUBCUTANEOUS | Status: DC
  Administered 2019-04-18 – 2019-04-20 (×5): 60 [IU] via SUBCUTANEOUS
  Filled 2019-04-18 (×6): qty 0.6

## 2019-04-18 MED ORDER — WARFARIN SODIUM 2.5 MG PO TABS
2.5000 mg | ORAL_TABLET | Freq: Once | ORAL | Status: AC
  Administered 2019-04-18: 2.5 mg via ORAL
  Filled 2019-04-18: qty 1

## 2019-04-18 MED ORDER — INSULIN ASPART 100 UNIT/ML ~~LOC~~ SOLN
6.0000 [IU] | Freq: Three times a day (TID) | SUBCUTANEOUS | Status: DC
  Administered 2019-04-19 – 2019-04-25 (×19): 6 [IU] via SUBCUTANEOUS

## 2019-04-18 MED ORDER — DEXAMETHASONE SODIUM PHOSPHATE 4 MG/ML IJ SOLN
1.0000 mg | INTRAMUSCULAR | Status: DC
  Administered 2019-04-18 – 2019-04-20 (×3): 1 mg via INTRAVENOUS
  Filled 2019-04-18 (×3): qty 1

## 2019-04-18 MED ORDER — STARCH (THICKENING) PO POWD
ORAL | Status: DC | PRN
  Filled 2019-04-18 (×2): qty 227

## 2019-04-18 NOTE — Progress Notes (Signed)
Physical Therapy Treatment Patient Details Name: [name] MRN: [id number] DOB: 1966/02/18 Today's Date: 04/18/2019    History of Present Illness 53 year old super morbidly obese Caucasian male with history of seizures, DM type II, depression, BPH, OSA, OHS on 2 to 3 L nasal cannula oxygen at home.  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  He was admitted to ICU    PT Comments    [name] is much more alert and moving extremities with more strength today. and able to provide more information about PLOF and his WC dimensions. Patient sleeps in a regular bed at home. Unfortunately, the air bed is near to impossible to attempt sitting and transfers out. Currently, patient requires a mechanical lift. Appears the maxisky lift will be adequate. Nursing management will look into moving to a room with maxi sky lift. Continue mobility, attempt standing. Patient to find out his WC height so PT can attempt standing from recliner/build up height as needed and safe for patient.  Follow Up Recommendations  Home health PT     Equipment Recommendations  None recommended by PT    Recommendations for Other Services       Precautions / Restrictions Precautions Precaution Comments: monitor Sats, on 12 L HFNC-finger, asked about ear, pt said finger better monitor. tenor or sky OOB, may need chair bukat up. pt to have wife measure his power chair seat.    Mobility  Bed Mobility   Bed Mobility: Rolling Rolling: Mod assist;+2 for safety/equipment;+2 for physical assistance         General bed mobility comments: patient able to assist inrolling, able to move legs forward  to assist with rolling to place sky lift pad to assesss if it is adequate to use for transfers.  Transfers                    Ambulation/Gait                 Stairs             Wheelchair Mobility    Modified Rankin (Stroke Patients Only)       Balance                                            Cognition   Behavior During Therapy: WFL for tasks assessed/performed Overall Cognitive Status: Within Functional Limits for tasks assessed                                 General Comments: pt very talkative, much more alert and able to provide more info re: PLOF      Exercises General Exercises - Upper Extremity Shoulder ABduction: AROM;Both;10 reps;Theraband Theraband Level (Shoulder Abduction): Level 2 (Red) Elbow Extension: AROM;10 reps;Both;Supine;Theraband Theraband Level (Elbow Extension): Level 2 (Red) General Exercises - Lower Extremity Long Arc Quad: AROM;AAROM;Both;20 reps;Supine Heel Slides: AROM;Both;10 reps;Supine Hip ABduction/ADduction: AROM;Right;15 reps;Supine Straight Leg Raises: AROM;Right;10 reps;Supine    General Comments        Pertinent Vitals/Pain Pain Assessment: No/denies pain    Home Living  Prior Function            PT Goals (current goals can now be found in the care plan section) Progress towards PT goals: Progressing toward goals    Frequency    Min 3X/week      PT Plan Current plan remains appropriate    Co-evaluation              AM-PAC PT "6 Clicks" Mobility   Outcome Measure  Help needed turning from your back to your side while in a flat bed without using bedrails?: A Lot Help needed moving from lying on your back to sitting on the side of a flat bed without using bedrails?: A Lot Help needed moving to and from a bed to a chair (including a wheelchair)?: Total Help needed standing up from a chair using your arms (e.g., wheelchair or bedside chair)?: Total Help needed to walk in hospital room?: Total Help needed climbing 3-5 steps with a railing? : Total 6 Click Score: 8    End of Session Equipment Utilized During  Treatment: Oxygen Activity Tolerance: Patient tolerated treatment well Patient left: in bed;with call bell/phone within reach Nurse Communication: Mobility status;Need for lift equipment(working on move to room with [facility]) PT Visit Diagnosis: Muscle weakness (generalized) (M62.81)     Time: [id number] PT Time Calculation (min) (ACUTE ONLY): 31 min  Charges:  $Therapeutic Exercise: 8-22 mins $Therapeutic Activity: 8-22 mins                     Tresa Endo PT Acute Rehabilitation Services Pager [phone] Office [phone]    Claretha Cooper 04/18/2019, 5:45 PM

## 2019-04-18 NOTE — Progress Notes (Signed)
While I was in the pt's room doing assessment and giving medication, pt got a phone call from wife and She told him that I won't need to call her tonight since she spoke with him and she is going to bed.

## 2019-04-18 NOTE — Progress Notes (Signed)
ANTICOAGULATION CONSULT NOTE - Follow Up Consult  Pharmacy Consult for warfarin Indication: history of pulmonary embolus and DVT  Allergies  Allergen Reactions  . Bee Venom Anaphylaxis, Shortness Of Breath and Swelling  . Other Shortness Of Breath    Itching, rash with IVP DYE, iodine, shellfish LATEX  . Penicillins Anaphylaxis and Shortness Of Breath    Has patient had a PCN reaction causing immediate rash, facial/tongue/throat swelling, SOB or lightheadedness with hypotension: Yes Has patient had a PCN reaction causing severe rash involving mucus membranes or skin necrosis: No Has patient had a PCN reaction that required hospitalization No Has patient had a PCN reaction occurring within the last 10 years: No If all of the above answers are "NO", then may proceed with Cephalosporin use.   . Shellfish Allergy Nausea And Vomiting and Other (See Comments)    Feels like insides are twisting  . Iodinated Diagnostic Agents     Other reaction(s): RASH  . Iohexol      Code: RASH, Desc: PT WAS ON PREDNISONE FOR GOUT TX. @ TIME OF SCAN AND RECEIVED 50 MG OF BENADRYL IV-ARS 10/08/07   . Iodine Rash  . Latex Rash    Patient Measurements: Height: 6\' 3"  (190.5 cm) Weight: (!) 462 lb (209.6 kg) IBW/kg (Calculated) : 84.5   Vital Signs: Temp: 97.8 F (36.6 C) (10/20 1114) Temp Source: Oral (10/20 1114) BP: 125/66 (10/20 1114) Pulse Rate: 81 (10/20 1114)  Labs: Recent Labs    04/16/19 0307 04/17/19 0330 04/17/19 1038 04/18/19 0315  HGB 10.8* 11.0* 10.9* 10.8*  HCT 34.3* 35.0* 32.0* 34.3*  PLT 81* 82*  --  75*  LABPROT 34.6* 33.1*  --  27.8*  INR 3.5* 3.3*  --  2.6*  CREATININE 4.19* 3.91*  --  3.75*    Estimated Creatinine Clearance: 43.8 mL/min (A) (by C-G formula based on SCr of 3.75 mg/dL (H)).  Medications:  Scheduled:  . busPIRone  15 mg Oral BID  . Chlorhexidine Gluconate Cloth  6 each Topical Daily  . dexamethasone (DECADRON) injection  1 mg Intravenous Q24H  .  dextromethorphan-guaiFENesin  1 tablet Oral BID  . DULoxetine  60 mg Oral Daily  . fentaNYL  1 patch Transdermal Q72H  . furosemide  80 mg Oral BID  . insulin aspart  0-20 Units Subcutaneous TID WC  . insulin aspart  0-5 Units Subcutaneous QHS  . insulin aspart  12 Units Subcutaneous TID WC  . insulin glargine  65 Units Subcutaneous BID  . lamoTRIgine  200 mg Oral BID  . levETIRAcetam  1,000 mg Oral BID  . loratadine  10 mg Oral Daily  . pantoprazole  40 mg Oral Daily  . potassium chloride  40 mEq Oral Daily  . pregabalin  50 mg Oral TID  . simvastatin  20 mg Oral QHS  . spironolactone  25 mg Oral Daily  . tamsulosin  0.4 mg Oral QPC supper  . Warfarin - Pharmacist Dosing Inpatient   Does not apply q1800    Assessment: 53 yo male admitted on 04/09/2019 for SOB and found to be positive for COVID-19. Pharmacy is consulted to dose warfarin for history of DVT and PE. Patient is on warfarin prior to admission. On admission, INR was supratherapeutic and warfarin was been held for three days (10/11-10/13). Warfarin was resumed on 10/14 when INR was therapeutic.   Today, INR decreased to 2.6, therapeutic.  Elevation may be related to drug interaction and poor oral intake (RN reports only  liquid intake, no solid foods). H/H remains low stable. Platelets down to 75 (baseline ~100-120k).  No bleeding or complications reported.  Significant drug interactions: Dexamethasone  Prior to Admission Warfarin Regimen: warfarin 5mg  daily  Goal of Therapy:  INR 2-3 Monitor platelets by anticoagulation protocol: Yes   Plan:  Resume warfarin 2.5 mg PO today at 18:00.  Monitor daily INR, CBC, and S/S of bleeding   Gretta Arab PharmD, BCPS Clinical pharmacist phone 7am- 5pm: [phone] 04/18/2019 12:42 PM

## 2019-04-18 NOTE — Plan of Care (Signed)
Talked with wife to update on current POC and patient status.  She's asking for [place] to consider transfer to [facility] or [facility].  Hoping the visitation procedures may be different and allow her to visit him like a "regular" patient.  I informed her that the visitation process may not be any different from [facility], since he will still be a covid-positive patient.  Dr. Informed of wife's request and said he would check to see if it were possible.

## 2019-04-18 NOTE — Plan of Care (Signed)
Patient requested that I NOT contact wife for update  - since he had already updated multiple times. Stated, "she's at work."

## 2019-04-18 NOTE — Progress Notes (Addendum)
PROGRESS NOTE                                                                                                                                                                                                             Patient Demographics:    [name], is a 53 y.o. male, DOB - 09-May-1966, [id number]  Outpatient Primary MD for the patient is Sharion Balloon, FNP    LOS - 9  Admit date - 04/09/2019    Chief Complaint  Patient presents with  . Respiratory Distress       Brief Narrative -  53 year old super morbidly obese Caucasian male with history of seizures, DM type II, depression, BPH, OSA, OHS on 2 to 3 L nasal cannula oxygen at home.  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  He was admitted to ICU stabilized and transferred to hospitalist service on 04/14/2019.   Subjective:   Patient in bed, appears comfortable, denies any headache, no fever, no chest pain or pressure, no shortness of breath , no abdominal pain. No focal weakness.   Assessment  & Plan :     1. Acute on chronic hypoxic Resp. Failure due to Acute Covid 19 Viral Pneumonitis during the ongoing 2020 Covid 19 Pandemic - he has baseline underlying obesity related hypoventilation syndrome and uses about 3 L nasal cannula at all times.  Treated this admission in ICU on IV Remdisvir and Decadron, tapering Decadron gradually, currently on 15 L HF al cannula oxygen.  When he moves he requires nonrebreather mask.  Also is developing atelectasis from his bedbound status and not moving out of bed compounded by his super morbid obesity.  Encouraged to sit up in chair in the daytime and use I-S and flutter valve for pulmonary toiletry.  Continue monitoring clinically, he remains noncompliant with suggestions kindly see nursing notes,  counseled.   COVID-19 Labs  Recent Labs    04/16/19 0307 04/17/19 0330 04/18/19 0315  DDIMER 0.45 0.48 0.42  CRP 6.3* 4.3* 2.3*    Lab Results  Component Value Date   SARSCOV2NAA POSITIVE (A) 04/09/2019    SpO2: 92 % O2 Flow Rate (L/min): 15 L/min FiO2 (%): 87 %  Hepatic Function Latest Ref  Rng & Units 04/18/2019 04/17/2019 04/16/2019  Total Protein 6.5 - 8.1 g/dL 6.4(L) 6.4(L) 6.4(L)  Albumin 3.5 - 5.0 g/dL 2.2(L) 2.3(L) 2.1(L)  AST 15 - 41 U/L 31 32 41  ALT 0 - 44 U/L _0 Alk Phosphatase 38 - 126 U/L 64 70 73  Total Bilirubin 0.3 - 1.2 mg/dL 1.2 1.2 1.0  Bilirubin, Direct 0.0 - 0.3 mg/dL - - -        Component Value Date/Time   BNP 18.1 04/18/2019 0315      2.  OSA/OHS - currently not on CPAP at home.  3.  History of PE and DVT.  On Coumadin INR therapeutic.  Pharmacy monitoring.  4. Hx seizure - Stable, continue combination of Keppra and Lamictal.   5.  Dyslipidemia.  On Zocor.  6.  Morbid obesity BMI of 57.  Outpatient follow-up PCP for weight loss.  7.  Deconditioning.  PT OT consulted.  Sit to sit up in chair and daytime.  8.  Depression and bipolar disorder.  Continue buspirone, Cymbalta and Lyrica combination.  9.  AKI on CKD 4.  Baseline creatinine 3.5.  Initially had AKI due to ATN now renal function is improving with ongoing diuresis with oral Lasix 80 mg twice daily.  Monitor renal function closely with ongoing diuresis. Has a Foley placed in ICU.  10. DM type II.  On Lantus and sliding scale combination dose adjusted 10/18.  Outpatient glycemic control is slightly poor due to hyperglycemia.  A1c was 7.9  CBG (last 3)  Recent Labs    04/17/19 1638 04/17/19 2118 04/18/19 0725  GLUCAP 177* 173* 133*     Condition -  Guarded  Family Communication  :   Updated wife [name] on 04/14/2019.  She tells me that patient is virtually bedbound and at best to transfers to bedside commode with assistance.  Splane to her that combination of DVT  PE, COVID, super morbid obesity, OHS is highly life-threatening.  She understands it.  She knows his prognosis is not good.  Code Status :  Full  Diet :    Diet Order            Diet regular Room service appropriate? Yes; Fluid consistency: Honey Thick  Diet effective now               Disposition Plan  :  TBD  Consults  :  PCCM  Procedures  :    PUD Prophylaxis : PPI  DVT Prophylaxis  : Coumadin  Lab Results  Component Value Date   PLT 75 (L) 04/18/2019   Lab Results  Component Value Date   INR 2.6 (H) 04/18/2019   INR 3.3 (H) 04/17/2019   INR 3.5 (H) 04/16/2019    Inpatient Medications  Scheduled Meds: . busPIRone  15 mg Oral BID  . Chlorhexidine Gluconate Cloth  6 each Topical Daily  . dexamethasone (DECADRON) injection  2 mg Intravenous Q24H  . dextromethorphan-guaiFENesin  1 tablet Oral BID  . DULoxetine  60 mg Oral Daily  . fentaNYL  1 patch Transdermal Q72H  . furosemide  80 mg Oral BID  . insulin aspart  0-20 Units Subcutaneous TID WC  . insulin aspart  0-5 Units Subcutaneous QHS  . insulin aspart  12 Units Subcutaneous TID WC  . insulin glargine  65 Units Subcutaneous BID  . lamoTRIgine  200 mg Oral BID  . levETIRAcetam  1,000 mg Oral BID  . loratadine  10 mg  Oral Daily  . pantoprazole  40 mg Oral Daily  . potassium chloride  40 mEq Oral Daily  . pregabalin  50 mg Oral TID  . simvastatin  20 mg Oral QHS  . spironolactone  25 mg Oral Daily  . tamsulosin  0.4 mg Oral QPC supper  . Warfarin - Pharmacist Dosing Inpatient   Does not apply q1800   Continuous Infusions: . sodium chloride 10 mL/hr at 04/13/19 1200   PRN Meds:.sodium chloride, acetaminophen, albuterol, guaiFENesin-dextromethorphan, HYDROcodone-acetaminophen, [DISCONTINUED] ondansetron **OR** ondansetron (ZOFRAN) IV, polyethylene glycol, Resource ThickenUp Clear, sodium chloride  Antibiotics  :    Anti-infectives (From admission, onward)   Start     Dose/Rate Route Frequency Ordered  Stop   04/11/19 1000  remdesivir 100 mg in sodium chloride 0.9 % 250 mL IVPB     100 mg 500 mL/hr over 30 Minutes Intravenous Every 24 hours 04/10/19 0000 04/14/19 1035   04/10/19 0100  remdesivir 200 mg in sodium chloride 0.9 % 250 mL IVPB     200 mg 500 mL/hr over 30 Minutes Intravenous Once 04/10/19 0000 04/10/19 0138       Time Spent in minutes  30   Lala Lund M.D on 04/18/2019 at 9:49 AM  To page go to [email or web address] - password [facility]  [facility] -  Office  [phone]   See all Orders from today for further details    Objective:   Vitals:   04/17/19 2037 04/18/19 0000 04/18/19 0322 04/18/19 0726  BP: (!) 147/70 137/77 131/74 112/73  Pulse: 90 82 79 80  Resp: 20   16  Temp: 98.1 F (36.7 C) 97.8 F (36.6 C) 98.2 F (36.8 C) 98.1 F (36.7 C)  TempSrc: Oral Oral Oral Oral  SpO2: 91% 91% 90% 92%  Weight:      Height:        Wt Readings from Last 3 Encounters:  04/10/19 (!) 209.6 kg  02/08/19 (!) 205.2 kg  11/03/17 (!) 205.2 kg     Intake/Output Summary (Last 24 hours) at 04/18/2019 0949 Last data filed at 04/18/2019 0548 Gross per 24 hour  Intake 600 ml  Output 3425 ml  Net -2825 ml     Physical Exam  Morbidly obese middle-aged white male lying in hospital bed in no distress, awake and alert, No new F.N deficits, flat affect [place].AT,PERRAL Supple Neck,No JVD, No cervical lymphadenopathy appriciated.  Symmetrical Chest wall movement, Good air movement bilaterally, CTAB RRR,No Gallops, Rubs or new Murmurs, No Parasternal Heave +ve B.Sounds, Abd Soft, No tenderness, No organomegaly appriciated, No rebound - guarding or rigidity. Foley No Cyanosis, Clubbing or edema, No new Rash or bruise    Data Review:    CBC Recent Labs  Lab 04/14/19 0343 04/15/19 0349 04/16/19 0307 04/17/19 0330 04/17/19 1038 04/18/19 0315  WBC 3.8* 3.2* 2.5* 3.0*  --  2.9*  HGB 11.3* 11.2* 10.8* 11.0* 10.9* 10.8*  HCT 34.5* 34.8* 34.3* 35.0* 32.0* 34.3*   PLT 93* 86* 81* 82*  --  75*  MCV 94.3 93.0 94.2 94.9  --  95.3  MCH 30.9 29.9 29.7 29.8  --  30.0  MCHC 32.8 32.2 31.5 31.4  --  31.5  RDW 13.9 14.1 14.4 14.2  --  13.9  LYMPHSABS 0.6* 0.5* 0.4* 0.6*  --  0.5*  MONOABS 0.3 0.2 0.2 0.3  --  0.3  EOSABS 0.0 0.0 0.0 0.1  --  0.0  BASOSABS 0.0 0.0 0.0 0.0  --  0.0  Chemistries  Recent Labs  Lab 04/14/19 0343 04/15/19 0349 04/16/19 0307 04/17/19 0330 04/17/19 1038 04/18/19 0315  NA 140 144 145 140 141 141  K 3.9 3.6 4.2 4.0 3.4* 4.3  CL 100 102 103 98  --  98  CO2 _0 --  32  GLUCOSE 144* 147* 238* 158*  --  166*  BUN 113* 123* 104* 111*  --  97*  CREATININE 4.30* 3.97* 4.19* 3.91*  --  3.75*  CALCIUM 8.4* 8.1* 8.3* 8.1*  --  8.1*  MG  --  2.0 2.1 2.1  --  2.1  AST 57* 51* 41 32  --  31  ALT _1 --  23  ALKPHOS 75 76 73 70  --  64  BILITOT 0.7 1.1 1.0 1.2  --  1.2   ------------------------------------------------------------------------------------------------------------------ No results for input(s): CHOL, HDL, LDLCALC, TRIG, CHOLHDL, LDLDIRECT in the last 72 hours.  Lab Results  Component Value Date   HGBA1C 7.9 (H) 04/10/2019   ------------------------------------------------------------------------------------------------------------------ No results for input(s): TSH, T4TOTAL, T3FREE, THYROIDAB in the last 72 hours.  Invalid input(s): FREET3  Cardiac Enzymes No results for input(s): CKMB, TROPONINI, MYOGLOBIN in the last 168 hours.  Invalid input(s): CK ------------------------------------------------------------------------------------------------------------------    Component Value Date/Time   BNP 18.1 04/18/2019 0315    Micro Results Recent Results (from the past 240 hour(s))  SARS Coronavirus 2 by RT PCR (hospital order, performed in [facility] hospital lab) Nasopharyngeal Nasopharyngeal Swab     Status: Abnormal   Collection Time: 04/09/19 12:47 PM   Specimen: Nasopharyngeal  Swab  Result Value Ref Range Status   SARS Coronavirus 2 POSITIVE (A) NEGATIVE Final    Comment: RESULT CALLED TO, READ BACK BY AND VERIFIED WITH:  A. TUTTLE,RN  _2   10/11/2020KAY (NOTE) If result is NEGATIVE SARS-CoV-2 target nucleic acids are NOT DETECTED. The SARS-CoV-2 RNA is generally detectable in upper and lower  respiratory specimens during the acute phase of infection. The lowest  concentration of SARS-CoV-2 viral copies this assay can detect is 250  copies / mL. A negative result does not preclude SARS-CoV-2 infection  and should not be used as the sole basis for treatment or other  patient management decisions.  A negative result may occur with  improper specimen collection / handling, submission of specimen other  than nasopharyngeal swab, presence of viral mutation(s) within the  areas targeted by this assay, and inadequate number of viral copies  (<250 copies / mL). A negative result must be combined with clinical  observations, patient history, and epidemiological information. If result is POSITIVE SARS-CoV-2 target nucleic acids are DETECTED. T he SARS-CoV-2 RNA is generally detectable in upper and lower  respiratory specimens during the acute phase of infection.  Positive  results are indicative of active infection with SARS-CoV-2.  Clinical  correlation with patient history and other diagnostic information is  necessary to determine patient infection status.  Positive results do  not rule out bacterial infection or co-infection with other viruses. If result is PRESUMPTIVE POSTIVE SARS-CoV-2 nucleic acids MAY BE PRESENT.   A presumptive positive result was obtained on the submitted specimen  and confirmed on repeat testing.  While 2019 novel coronavirus  (SARS-CoV-2) nucleic acids may be present in the submitted sample  additional confirmatory testing may be necessary for epidemiological  and / or clinical management purposes  to differentiate between   SARS-CoV-2 and other Sarbecovirus currently known to infect humans.  If  clinically indicated additional testing with an alternate test  methodology [phone]) is  advised. The SARS-CoV-2 RNA is generally  detectable in upper and lower respiratory specimens during the acute  phase of infection. The expected result is Negative. Fact Sheet for Patients:  StrictlyIdeas.no Fact Sheet for Healthcare Providers: BankingDealers.co.za This test is not yet approved or cleared by the [place] FDA and has been authorized for detection and/or diagnosis of SARS-CoV-2 by FDA under an Emergency Use Authorization (EUA).  This EUA will remain in effect (meaning this test can be used) for the duration of the COVID-19 declaration under Section 564(b)(1) of the Act, 21 U.S.C. section 360bbb-3(b)(1), unless the authorization is terminated or revoked sooner. Performed at [facility], [address] [place], [place] [address]   Blood Culture (routine x 2)     Status: None   Collection Time: 04/09/19  2:25 PM   Specimen: Left Antecubital; Blood  Result Value Ref Range Status   Specimen Description   Final    LEFT ANTECUBITAL BOTTLES DRAWN AEROBIC AND ANAEROBIC   Special Requests Blood Culture adequate volume  Final   Culture   Final    NO GROWTH 5 DAYS Performed at [facility], [address], [place], [place] [address]    Report Status 04/14/2019 FINAL  Final  Blood Culture (routine x 2)     Status: None   Collection Time: 04/09/19  2:26 PM   Specimen: BLOOD LEFT HAND  Result Value Ref Range Status   Specimen Description   Final    BLOOD LEFT HAND BOTTLES DRAWN AEROBIC AND ANAEROBIC   Special Requests Blood Culture adequate volume  Final   Culture   Final    NO GROWTH 5 DAYS Performed at [facility], [address], [place], [place] [address]    Report Status 04/14/2019 FINAL  Final  MRSA PCR Screening     Status: None   Collection Time:  04/09/19 11:55 PM   Specimen: Nasopharyngeal  Result Value Ref Range Status   MRSA by PCR NEGATIVE NEGATIVE Final    Comment:        The GeneXpert MRSA Assay (FDA approved for NASAL specimens only), is one component of a comprehensive MRSA colonization surveillance program. It is not intended to diagnose MRSA infection nor to guide or monitor treatment for MRSA infections. Performed at [facility], [place] [address], [place], [place] [address]     Radiology Reports Dg Chest Port 1 View  Result Date: 04/09/2019 CLINICAL DATA:  Onset respiratory distress today. COVID-19 exposure. EXAM: PORTABLE CHEST 1 VIEW COMPARISON:  PA and lateral chest 01/26/2019.  CT chest 12/28/2018. FINDINGS: Lung volumes are low. There is patchy bilateral airspace disease. Heart size is normal. No pneumothorax or pleural effusion. IMPRESSION: Patchy bilateral airspace disease worrisome for pneumonia including atypical/viral infection. Electronically Signed   By: Inge Rise M.D.   On: 04/09/2019 13:51

## 2019-04-18 NOTE — Plan of Care (Addendum)
Still no sling from laundry or [employer]. Chrg aware and trying to assist with locating sling to get patient out of bed. AC asked to assist in locating sling that was sent to laundry - to see if cleaned and available.  [employer] indicated sling would not be delivered till early evening (today).  AC confirmed  - Sling sent to laundry cannot be ready till 10/21.  Once available - will get patient out of bed and in chair.  Patinet still maintain SPo2 at low 90s with 15LHFNC.  Has had no indication of needing NRB at this time - but "y" is in place just in care.  Patient has been placed in "chair"  Position in bariatric bed and has also been encouraged to continue to use Incentive Spirometer.  Received full bath and change in linens. Will continue to monitor as needed.

## 2019-04-19 ENCOUNTER — Inpatient Hospital Stay (HOSPITAL_COMMUNITY): Payer: Medicare Other

## 2019-04-19 DIAGNOSIS — U071 COVID-19: Secondary | ICD-10-CM | POA: Diagnosis not present

## 2019-04-19 DIAGNOSIS — J9611 Chronic respiratory failure with hypoxia: Secondary | ICD-10-CM | POA: Diagnosis not present

## 2019-04-19 DIAGNOSIS — N179 Acute kidney failure, unspecified: Secondary | ICD-10-CM | POA: Diagnosis not present

## 2019-04-19 DIAGNOSIS — R0902 Hypoxemia: Secondary | ICD-10-CM | POA: Diagnosis not present

## 2019-04-19 LAB — PROTIME-INR
INR: 2.2 — ABNORMAL HIGH (ref 0.8–1.2)
Prothrombin Time: 23.9 seconds — ABNORMAL HIGH (ref 11.4–15.2)

## 2019-04-19 LAB — CBC WITH DIFFERENTIAL/PLATELET
Abs Immature Granulocytes: 0.02 10*3/uL (ref 0.00–0.07)
Basophils Absolute: 0 10*3/uL (ref 0.0–0.1)
Basophils Relative: 0 %
Eosinophils Absolute: 0 10*3/uL (ref 0.0–0.5)
Eosinophils Relative: 1 %
HCT: 33 % — ABNORMAL LOW (ref 39.0–52.0)
Hemoglobin: 10.3 g/dL — ABNORMAL LOW (ref 13.0–17.0)
Immature Granulocytes: 1 %
Lymphocytes Relative: 14 %
Lymphs Abs: 0.4 10*3/uL — ABNORMAL LOW (ref 0.7–4.0)
MCH: 29.8 pg (ref 26.0–34.0)
MCHC: 31.2 g/dL (ref 30.0–36.0)
MCV: 95.4 fL (ref 80.0–100.0)
Monocytes Absolute: 0.2 10*3/uL (ref 0.1–1.0)
Monocytes Relative: 6 %
Neutro Abs: 1.9 10*3/uL (ref 1.7–7.7)
Neutrophils Relative %: 78 %
Platelets: 64 10*3/uL — ABNORMAL LOW (ref 150–400)
RBC: 3.46 MIL/uL — ABNORMAL LOW (ref 4.22–5.81)
RDW: 13.8 % (ref 11.5–15.5)
WBC: 2.5 10*3/uL — ABNORMAL LOW (ref 4.0–10.5)
nRBC: 0 % (ref 0.0–0.2)

## 2019-04-19 LAB — GLUCOSE, CAPILLARY
Glucose-Capillary: 122 mg/dL — ABNORMAL HIGH (ref 70–99)
Glucose-Capillary: 159 mg/dL — ABNORMAL HIGH (ref 70–99)
Glucose-Capillary: 131 mg/dL — ABNORMAL HIGH (ref 70–99)
Glucose-Capillary: 280 mg/dL — ABNORMAL HIGH (ref 70–99)

## 2019-04-19 LAB — COMPREHENSIVE METABOLIC PANEL
ALT: 20 U/L (ref 0–44)
AST: 30 U/L (ref 15–41)
Albumin: 2.2 g/dL — ABNORMAL LOW (ref 3.5–5.0)
Alkaline Phosphatase: 63 U/L (ref 38–126)
Anion gap: 11 (ref 5–15)
BUN: 96 mg/dL — ABNORMAL HIGH (ref 6–20)
CO2: 31 mmol/L (ref 22–32)
Calcium: 8 mg/dL — ABNORMAL LOW (ref 8.9–10.3)
Chloride: 95 mmol/L — ABNORMAL LOW (ref 98–111)
Creatinine, Ser: 3.68 mg/dL — ABNORMAL HIGH (ref 0.61–1.24)
GFR calc Af Amer: 21 mL/min — ABNORMAL LOW (ref >60)
GFR calc non Af Amer: 18 mL/min — ABNORMAL LOW (ref >60)
Glucose, Bld: 191 mg/dL — ABNORMAL HIGH (ref 70–99)
Potassium: 4.3 mmol/L (ref 3.5–5.1)
Sodium: 137 mmol/L (ref 135–145)
Total Bilirubin: 1 mg/dL (ref 0.3–1.2)
Total Protein: 6.3 g/dL — ABNORMAL LOW (ref 6.5–8.1)

## 2019-04-19 LAB — MAGNESIUM: Magnesium: 2.2 mg/dL (ref 1.7–2.4)

## 2019-04-19 LAB — C-REACTIVE PROTEIN: CRP: 1.4 mg/dL — ABNORMAL HIGH (ref <1.0)

## 2019-04-19 MED ORDER — WARFARIN SODIUM 5 MG PO TABS
5.0000 mg | ORAL_TABLET | Freq: Once | ORAL | Status: AC
  Administered 2019-04-19: 5 mg via ORAL
  Filled 2019-04-19: qty 1

## 2019-04-19 NOTE — Progress Notes (Signed)
Occupational Therapy Treatment Patient Details Name: [name] MRN: [id number] DOB: 1965-08-31 Today's Date: 04/19/2019    History of present illness 53 year old super morbidly obese Caucasian male with history of seizures, DM type II, depression, BPH, OSA, OHS on 2 to 3 L nasal cannula oxygen at home.  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  He was admitted to ICU   OT comments  Pt seen for additional session for transfer OOB to recliner. Maxisky still not working properly so utilize Midwife for [place] transfer. Pt requiring +3-4 for safety with transfers. Pt with good assist to complete bed mobility with maxA (able to bring trunk forward away from [facility]) when replacing/readjusting lift slings. Pt very pleased to be OOB in recliner; performed some LB therex for continued strengthening/endurance. Pt on 12-15L HFNC throughout sessions and tolerating well. Will continue per POC.   Follow Up Recommendations  Supervision/Assistance - 24 hour;SNF;Home health OT    Equipment Recommendations  Other (comment);Hospital bed(bariatric hoyer lift)    Recommendations for Other Services      Precautions / Restrictions Precautions Precautions: Fall Precaution Comments: monitor Sats, on 12 L HFNC-finger, . tenor, the maxisky did not pick him up,made  chair seat higher with pads. Restrictions Weight Bearing Restrictions: No       Mobility Bed Mobility Overal bed mobility: Needs Assistance Bed Mobility: Rolling Rolling: Mod assist         General bed mobility comments: able to sit forward in long sitting in bed with max assist to pull with arms to remove lift pad.  Transfers Overall transfer level: Needs assistance Equipment used: Ambulation equipment used             General transfer comment: attempted lift  with maxisky but did not pick pt up. ?  if charged enough. Used Tenor.    Balance       Sitting balance - Comments: sits in recliner, able to lean forward, operate leg rest.                                   ADL either performed or assessed with clinical judgement   ADL Overall ADL's : Needs assistance/impaired Eating/Feeding: Set up;Sitting Eating/Feeding Details (indicate cue type and reason): pt with thickened liquids Grooming: Wash/dry face;Set up;Sitting Grooming Details (indicate cue type and reason): seated in recliner                             Functional mobility during ADLs: Total assistance;+2 for physical assistance;+2 for safety/equipment(tenor lift OOB +3-4) General ADL Comments: assisted with transfer OOB to recliner during session                       Cognition Arousal/Alertness: Awake/alert Behavior During Therapy: WFL for tasks assessed/performed Overall Cognitive Status: Within Functional Limits for tasks assessed                                          Exercises Exercises: General Lower Extremity General Exercises - Lower Extremity Long Arc Quad: AROM;Both;10 reps;Seated Hip Flexion/Marching: AROM;Both;5 reps   Shoulder Instructions  General Comments      Pertinent Vitals/ Pain       Pain Assessment: Faces Faces Pain Scale: Hurts little more Pain Location: generalized Pain Descriptors / Indicators: Discomfort Pain Intervention(s): Monitored during session  Home Living                                          Prior Functioning/Environment              Frequency  Min 3X/week(3x as pt still stating he refuses rehab)        Progress Toward Goals  OT Goals(current goals can now be found in the care plan section)  Progress towards OT goals: Progressing toward goals  Acute Rehab OT Goals Patient Stated Goal: to go home OT Goal Formulation: With patient Time For Goal Achievement:  04/28/19 Potential to Achieve Goals: Good ADL Goals Pt Will Perform Upper Body Bathing: with set-up;sitting Pt Will Perform Lower Body Bathing: with min assist;sit to/from stand;with adaptive equipment Pt Will Transfer to Toilet: with mod assist;with +2 assist;bedside commode;stand pivot transfer Pt Will Perform Toileting - Clothing Manipulation and hygiene: with min assist;with adaptive equipment;sit to/from stand;sitting/lateral leans Additional ADL Goal #1: Pt will demonstrate pursed lip breathing technique with ADL with SpO2 remaining above 88.  Plan Discharge plan remains appropriate    Co-evaluation    PT/OT/SLP Co-Evaluation/Treatment: Yes Reason for Co-Treatment: For patient/therapist safety;To address functional/ADL transfers PT goals addressed during session: Mobility/safety with mobility OT goals addressed during session: ADL's and self-care;Strengthening/ROM      AM-PAC OT "6 Clicks" Daily Activity     Outcome Measure   Help from another person eating meals?: None Help from another person taking care of personal grooming?: A Little Help from another person toileting, which includes using toliet, bedpan, or urinal?: Total Help from another person bathing (including washing, rinsing, drying)?: A Lot Help from another person to put on and taking off regular upper body clothing?: A Lot Help from another person to put on and taking off regular lower body clothing?: Total 6 Click Score: 13    End of Session Equipment Utilized During Treatment: Other (comment)(tenor lift)  OT Visit Diagnosis: Other abnormalities of gait and mobility (R26.89);Muscle weakness (generalized) (M62.81);Pain   Activity Tolerance Patient tolerated treatment well   Patient Left in chair;with call bell/phone within reach   Nurse Communication Mobility status;Need for lift equipment        Time: [id number] OT Time Calculation (min): 36 min  Charges: OT General Charges $OT Visit: 1 Visit OT  Treatments $Therapeutic Activity: 8-22 mins  Lou Cal, OT Supplemental Rehabilitation Services Pager [phone] Office [place] 04/19/2019, 4:32 PM

## 2019-04-19 NOTE — Progress Notes (Signed)
Physical Therapy Treatment Patient Details Name: [name] MRN: [id number] DOB: October 15, 1965 Today's Date: 04/19/2019    History of Present Illness 53 year old super morbidly obese Caucasian male with history of seizures, DM type II, depression, BPH, OSA, OHS on 2 to 3 L nasal cannula oxygen at home.  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  He was admitted to ICU    PT Comments    Patient is improving in bed mobility, assisting with rolling for lift pad to be placed, Unfortunately, the lift battery not charged so unable to assist OOB. Unable to work on sitting EOB due to risk for sliding off.   Will attempt lift OOB this PM.  Follow Up Recommendations  Home health PT     Equipment Recommendations  None recommended by PT(possible lift)    Recommendations for Other Services       Precautions / Restrictions Precautions Precautions: Fall Precaution Comments: monitor Sats, on 12 L HFNC-finger, . tenor or sky OOB, may need chair seat higher Restrictions Weight Bearing Restrictions: No    Mobility  Bed Mobility Overal bed mobility: Needs Assistance Bed Mobility: Rolling Rolling: Mod assist         General bed mobility comments: rolling towards L/R to place maxi sky pad; with pt able to assist with reaching and moving LEs towards each side  Transfers                 General transfer comment: attempted lift  with maxisky but battery was not charged.  Ambulation/Gait                 Stairs             Wheelchair Mobility    Modified Rankin (Stroke Patients Only)       Balance                                            Cognition Arousal/Alertness: Awake/alert Behavior During Therapy: WFL for tasks assessed/performed Overall Cognitive Status: Within Functional Limits for tasks assessed                                        Exercises      General Comments        Pertinent Vitals/Pain Pain Assessment: Faces Faces Pain Scale: Hurts little more Pain Location: generalized Pain Descriptors / Indicators: Discomfort Pain Intervention(s): Monitored during session    Home Living                      Prior Function            PT Goals (current goals can now be found in the care plan section) Acute Rehab PT Goals Patient Stated Goal: to go home Progress towards PT goals: Progressing toward goals    Frequency    Min 3X/week      PT Plan Current plan remains appropriate    Co-evaluation PT/OT/SLP Co-Evaluation/Treatment: Yes Reason for Co-Treatment: For patient/therapist safety;To address functional/ADL transfers PT goals addressed during session: Mobility/safety with mobility OT goals addressed during session: ADL's and self-care      AM-PAC PT "6 Clicks"  Mobility   Outcome Measure  Help needed turning from your back to your side while in a flat bed without using bedrails?: A Lot Help needed moving from lying on your back to sitting on the side of a flat bed without using bedrails?: Total Help needed moving to and from a bed to a chair (including a wheelchair)?: Total Help needed standing up from a chair using your arms (e.g., wheelchair or bedside chair)?: Total Help needed to walk in hospital room?: Total Help needed climbing 3-5 steps with a railing? : Total 6 Click Score: 7    End of Session Equipment Utilized During Treatment: Oxygen Activity Tolerance: Patient tolerated treatment well Patient left: in bed;with call bell/phone within reach Nurse Communication: Mobility status;Need for lift equipment PT Visit Diagnosis: Muscle weakness (generalized) (M62.81)     Time: [id number] PT Time Calculation (min) (ACUTE ONLY): 43 min  Charges:  $Therapeutic Activity: 23-37 mins                     Tresa Endo PT Acute  Rehabilitation Services  Office [phone]    Claretha Cooper 04/19/2019, 1:57 PM

## 2019-04-19 NOTE — Progress Notes (Addendum)
Occupational Therapy Treatment Patient Details Name: [name] MRN: [id number] DOB: September 11, 1965 Today's Date: 04/19/2019    History of present illness 53 year old super morbidly obese Caucasian male with history of seizures, DM type II, depression, BPH, OSA, OHS on 2 to 3 L nasal cannula oxygen at home.  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  He was admitted to ICU   OT comments  Pt making progress towards OT goals. Seen as co-treat with PT with plans for transfer OOB to recliner using maxi sky lift. Pt able to perform bed mobility with mod-maxA (+2); pt with good initiation/effort to self-assist. Pt completing grooming ADL with setup assist at bed level prior to mobility. Maxi sky lift malfunctioning when attempting to perform transfer OOB (question due to low battery), pt returned to supine and maxisky placed back in charging block. (Also checked and still awaiting tenor lift pad to floor at time of session). Will plan to re-attempt this PM. Acute OT to follow.   Follow Up Recommendations  Supervision/Assistance - 24 hour;SNF;Home health OT    Equipment Recommendations  Other (comment);Hospital bed(bariatric hoyer lift)    Recommendations for Other Services      Precautions / Restrictions Precautions Precautions: Fall Precaution Comments: monitor Sats, on 12 L HFNC-finger, asked about ear, pt said finger better monitor. tenor or sky OOB, may need chair bukat up. pt to have wife measure his power chair seat. Restrictions Weight Bearing Restrictions: No       Mobility Bed Mobility Overal bed mobility: Needs Assistance Bed Mobility: Rolling Rolling: Max assist;Mod assist;+2 for physical assistance;+2 for safety/equipment         General bed mobility comments: rolling towars L/R to place maxi sky pad; with able to assist with reaching and  moving LEs towards each side  Transfers                 General transfer comment: deferred this session as maxi sky lift not working (suspect battery issue) placed in position to charge and will check back later today for OOB to recliner    Balance                                           ADL either performed or assessed with clinical judgement   ADL Overall ADL's : Needs assistance/impaired Eating/Feeding: Set up;Sitting   Grooming: Set up;Sitting Grooming Details (indicate cue type and reason): sitting up in bed                             Functional mobility during ADLs: Maximal assistance;+2 for physical assistance(bed mobility) General ADL Comments: assisted with rolling/placing maxisky pad in preparation for transfer OOB to recliner; maxisky lift malfunctioning (appears to be battery not charged), placed battery in proper location to charge and will check back to assist OOB     Vision       Perception     Praxis      Cognition Arousal/Alertness: Awake/alert Behavior During Therapy: [facility] for tasks assessed/performed Overall Cognitive Status: Within Functional Limits for tasks assessed  Exercises     Shoulder Instructions       General Comments      Pertinent Vitals/ Pain       Pain Assessment: Faces Faces Pain Scale: Hurts a little bit Pain Location: generalized Pain Intervention(s): Monitored during session  Home Living                                          Prior Functioning/Environment              Frequency  Min 3X/week(2-3x pending dispo )        Progress Toward Goals  OT Goals(current goals can now be found in the care plan section)  Progress towards OT goals: Progressing toward goals  Acute Rehab OT Goals Patient Stated Goal: to go home OT Goal Formulation: With patient Time For Goal Achievement: 04/28/19 Potential  to Achieve Goals: Good ADL Goals Pt Will Perform Upper Body Bathing: with set-up;sitting Pt Will Perform Lower Body Bathing: with min assist;sit to/from stand;with adaptive equipment Pt Will Transfer to Toilet: with mod assist;with +2 assist;bedside commode;stand pivot transfer Pt Will Perform Toileting - Clothing Manipulation and hygiene: with min assist;with adaptive equipment;sit to/from stand;sitting/lateral leans Additional ADL Goal #1: Pt will demonstrate pursed lip breathing technique with ADL with SpO2 remaining above 88.  Plan Discharge plan remains appropriate    Co-evaluation    PT/OT/SLP Co-Evaluation/Treatment: Yes Reason for Co-Treatment: For patient/therapist safety   OT goals addressed during session: ADL's and self-care      AM-PAC OT "6 Clicks" Daily Activity     Outcome Measure   Help from another person eating meals?: None Help from another person taking care of personal grooming?: A Little Help from another person toileting, which includes using toliet, bedpan, or urinal?: Total Help from another person bathing (including washing, rinsing, drying)?: A Lot Help from another person to put on and taking off regular upper body clothing?: A Lot Help from another person to put on and taking off regular lower body clothing?: Total 6 Click Score: 13    End of Session Equipment Utilized During Treatment: Oxygen(15L HFNC)  OT Visit Diagnosis: Other abnormalities of gait and mobility (R26.89);Muscle weakness (generalized) (M62.81);Pain Pain - part of body: (generalized)   Activity Tolerance Patient tolerated treatment well   Patient Left in bed;with call bell/phone within reach   Nurse Communication Mobility status;Need for lift equipment        Time: [id number] OT Time Calculation (min): 41 min  Charges: OT General Charges $OT Visit: 1 Visit OT Treatments $Self Care/Home Management : 8-22 mins  Lou Cal, OT Supplemental Rehabilitation  Services Pager [phone] Office [phone]    Raymondo Band 04/19/2019, 1:47 PM

## 2019-04-19 NOTE — Progress Notes (Signed)
PROGRESS NOTE                                                                                                                                                                                                             Patient Demographics:    [name], is a 53 y.o. male, DOB - Dec 25, 1965, [id number]  Admit date - 04/09/2019   Admitting Physician Ejiroghene Arlyce Dice, MD  Outpatient Primary MD for the patient is Sharion Balloon, FNP  LOS - 10   Chief Complaint  Patient presents with  . Respiratory Distress       Brief Narrative    53 year old super morbidly obese Caucasian male with history of seizures, DM type II, depression, BPH, OSA, OHS on 2 to 3 L nasal cannula oxygen at home.  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  He was admitted to ICU stabilized and transferred to hospitalist service on 04/14/2019.   Subjective:    [name] today denies any headache, nausea, vomiting, he remains on 15 L nasal cannula, denies any chest pain.      Assessment  & Plan :    Active Problems:   OSA (obstructive sleep apnea)   Obesity hypoventilation syndrome (HCC)   Pneumonia due to COVID-19 virus   Pressure injury of skin   Chronic respiratory failure with hypoxia (HCC)   History of pulmonary embolus (PE)   Morbid obesity with BMI of 60.0-69.9, adult (HCC)   Seizures (HCC)   Diabetes mellitus type 2, uncontrolled, with complications (HCC)   Acute renal failure superimposed on stage 3b chronic kidney disease ([place])  Acute on chronic hypoxic Resp. Failure due to Acute Covid 19 Viral Pneumonitis during the ongoing 2020 Covid 19 Pandemic  -Patient remains with significant oxygen requirement, has increase of oxygen requirement, currently on 15 L nasal cannula . -I have discussed that patient at length, he is  at risk for worsening respiratory failure, especially with history of morbid obesity, underlying obesity related hypoventilation syndrome and uses about 3 L nasal cannula at all times.    He was encouraged today to continue using incentive spirometry, flutter valve, out of bed to chair, he will not be able to prone given his morbid  obesity . -On Decadron, tapering gradually . -  Also is developing atelectasis from his bedbound status and not moving out of bed compounded by his super morbid obesity.  Encouraged to sit up in chair in the daytime and use I-S and flutter valve for pulmonary toiletry.  Continue monitoring clinically. -We will repeat chest x-ray today.  COVID-19 Labs  Recent Labs    04/17/19 0330 04/18/19 0315 04/19/19 0125  DDIMER 0.48 0.42  --   CRP 4.3* 2.3* 1.4*    Lab Results  Component Value Date   SARSCOV2NAA POSITIVE (A) 04/09/2019   OSA/OHS  - currently not on CPAP at home.  Thrombocytopenia -Appears to be chronic, platelet count trending down, most likely in the setting of Covid, continue to monitor closely  History of PE and DVT.  - On Coumadin INR therapeutic.  Pharmacy monitoring.  Hx seizure  - Stable, continue combination of Keppra and Lamictal.   Dyslipidemia.   - On Zocor.  Morbid obesity BMI of 57.   - Outpatient follow-up PCP for weight loss.  Deconditioning.   - PT OT consulted.  Sit to sit up in chair and daytime.  Depression and bipolar disorder.  - Continue buspirone, Cymbalta and Lyrica combination.  AKI on CKD 4.   - Baseline creatinine 3.5.  Initially had AKI due to ATN now improving with ongoing diuresis with oral Lasix 80 mg twice daily.  Monitor renal function closely with ongoing diuresis.  DM type II.   - On Lantus and sliding scale combination dose adjusted 10/18.  Outpatient glycemic control is slightly poor due to hyperglycemia.  A1c was 7.9   Code Status :Full  Family Communication  : Discussed with  patient  Disposition Plan  : Pending further work-up  Barriers For Discharge : Remains on 15 L nasal cannula  Consults  : None  Procedures  : None  DVT Prophylaxis  :  On warfarin  Lab Results  Component Value Date   PLT 64 (L) 04/19/2019    Antibiotics  :    Anti-infectives (From admission, onward)   Start     Dose/Rate Route Frequency Ordered Stop   04/11/19 1000  remdesivir 100 mg in sodium chloride 0.9 % 250 mL IVPB     100 mg 500 mL/hr over 30 Minutes Intravenous Every 24 hours 04/10/19 0000 04/14/19 1035   04/10/19 0100  remdesivir 200 mg in sodium chloride 0.9 % 250 mL IVPB     200 mg 500 mL/hr over 30 Minutes Intravenous Once 04/10/19 0000 04/10/19 0138        Objective:   Vitals:   04/18/19 2012 04/19/19 0016 04/19/19 0443 04/19/19 0724  BP: 127/65 138/69 120/78 107/71  Pulse: 81 83 84 86  Resp: 17   18  Temp: 98 F (36.7 C) 97.9 F (36.6 C) 98.7 F (37.1 C) 98.1 F (36.7 C)  TempSrc: Oral Oral Oral Oral  SpO2: (!) 88% 90% 91% 92%  Weight:      Height:        Wt Readings from Last 3 Encounters:  04/10/19 (!) 209.6 kg  02/08/19 (!) 205.2 kg  11/03/17 (!) 205.2 kg     Intake/Output Summary (Last 24 hours) at 04/19/2019 1111 Last data filed at 04/19/2019 0447 Gross per 24 hour  Intake 580 ml  Output 2850 ml  Net -2270 ml     Physical Exam  Awake Alert, Oriented X 3, No new F.N deficits, Normal affect Symmetrical Chest wall movement, Minister  entry in the bases, no wheezing RRR,No Gallops,Rubs or new Murmurs, No Parasternal Heave +ve B.Sounds, Abd Soft, No tenderness,No rebound - guarding or rigidity. No Cyanosis, Clubbing , +1  edema, chronic lower skin discoloration secondary to venous stasis    Data Review:    CBC Recent Labs  Lab 04/15/19 0349 04/16/19 0307 04/17/19 0330 04/17/19 1038 04/18/19 0315 04/19/19 0125  WBC 3.2* 2.5* 3.0*  --  2.9* 2.5*  HGB 11.2* 10.8* 11.0* 10.9* 10.8* 10.3*  HCT 34.8* 34.3* 35.0* 32.0* 34.3*  33.0*  PLT 86* 81* 82*  --  75* 64*  MCV 93.0 94.2 94.9  --  95.3 95.4  MCH 29.9 29.7 29.8  --  30.0 29.8  MCHC 32.2 31.5 31.4  --  31.5 31.2  RDW 14.1 14.4 14.2  --  13.9 13.8  LYMPHSABS 0.5* 0.4* 0.6*  --  0.5* 0.4*  MONOABS 0.2 0.2 0.3  --  0.3 0.2  EOSABS 0.0 0.0 0.1  --  0.0 0.0  BASOSABS 0.0 0.0 0.0  --  0.0 0.0    Chemistries  Recent Labs  Lab 04/15/19 0349 04/16/19 0307 04/17/19 0330 04/17/19 1038 04/18/19 0315 04/19/19 0125  NA 144 145 140 141 141 137  K 3.6 4.2 4.0 3.4* 4.3 4.3  CL 102 103 98  --  98 95*  CO2 30 31 29   --  32 31  GLUCOSE 147* 238* 158*  --  166* 191*  BUN 123* 104* 111*  --  97* 96*  CREATININE 3.97* 4.19* 3.91*  --  3.75* 3.68*  CALCIUM 8.1* 8.3* 8.1*  --  8.1* 8.0*  MG 2.0 2.1 2.1  --  2.1 2.2  AST 51* 41 32  --  31 30  ALT 29 26 24   --  23 20  ALKPHOS 76 73 70  --  64 63  BILITOT 1.1 1.0 1.2  --  1.2 1.0   ------------------------------------------------------------------------------------------------------------------ No results for input(s): CHOL, HDL, LDLCALC, TRIG, CHOLHDL, LDLDIRECT in the last 72 hours.  Lab Results  Component Value Date   HGBA1C 7.9 (H) 04/10/2019   ------------------------------------------------------------------------------------------------------------------ No results for input(s): TSH, T4TOTAL, T3FREE, THYROIDAB in the last 72 hours.  Invalid input(s): FREET3 ------------------------------------------------------------------------------------------------------------------ No results for input(s): VITAMINB12, FOLATE, FERRITIN, TIBC, IRON, RETICCTPCT in the last 72 hours.  Coagulation profile Recent Labs  Lab 04/15/19 0349 04/16/19 0307 04/17/19 0330 04/18/19 0315 04/19/19 0125  INR 3.2* 3.5* 3.3* 2.6* 2.2*    Recent Labs    04/17/19 0330 04/18/19 0315  DDIMER 0.48 0.42    Cardiac Enzymes No results for input(s): CKMB, TROPONINI, MYOGLOBIN in the last 168 hours.  Invalid input(s):  CK ------------------------------------------------------------------------------------------------------------------    Component Value Date/Time   BNP 18.1 04/18/2019 0315    Inpatient Medications  Scheduled Meds: . busPIRone  15 mg Oral BID  . Chlorhexidine Gluconate Cloth  6 each Topical Daily  . dexamethasone (DECADRON) injection  1 mg Intravenous Q24H  . dextromethorphan-guaiFENesin  1 tablet Oral BID  . DULoxetine  60 mg Oral Daily  . fentaNYL  1 patch Transdermal Q72H  . furosemide  80 mg Oral BID  . insulin aspart  0-20 Units Subcutaneous TID WC  . insulin aspart  0-5 Units Subcutaneous QHS  . insulin aspart  6 Units Subcutaneous TID WC  . insulin glargine  60 Units Subcutaneous BID  . lamoTRIgine  200 mg Oral BID  . levETIRAcetam  1,000 mg Oral BID  . loratadine  10 mg Oral Daily  .  pantoprazole  40 mg Oral Daily  . potassium chloride  40 mEq Oral Daily  . pregabalin  50 mg Oral TID  . simvastatin  20 mg Oral QHS  . spironolactone  25 mg Oral Daily  . tamsulosin  0.4 mg Oral QPC supper  . warfarin  5 mg Oral ONCE-1800  . Warfarin - Pharmacist Dosing Inpatient   Does not apply q1800   Continuous Infusions: . sodium chloride 10 mL/hr at 04/13/19 1200   PRN Meds:.sodium chloride, acetaminophen, albuterol, food thickener, guaiFENesin-dextromethorphan, HYDROcodone-acetaminophen, [DISCONTINUED] ondansetron **OR** ondansetron (ZOFRAN) IV, polyethylene glycol, Resource ThickenUp Clear, sodium chloride  Micro Results Recent Results (from the past 240 hour(s))  SARS Coronavirus 2 by RT PCR (hospital order, performed in [place] hospital lab) Nasopharyngeal Nasopharyngeal Swab     Status: Abnormal   Collection Time: 04/09/19 12:47 PM   Specimen: Nasopharyngeal Swab  Result Value Ref Range Status   SARS Coronavirus 2 POSITIVE (A) NEGATIVE Final    Comment: RESULT CALLED TO, READ BACK BY AND VERIFIED WITH:  A. TUTTLE,RN  @1341   10/11/2020KAY (NOTE) If result is  NEGATIVE SARS-CoV-2 target nucleic acids are NOT DETECTED. The SARS-CoV-2 RNA is generally detectable in upper and lower  respiratory specimens during the acute phase of infection. The lowest  concentration of SARS-CoV-2 viral copies this assay can detect is 250  copies / mL. A negative result does not preclude SARS-CoV-2 infection  and should not be used as the sole basis for treatment or other  patient management decisions.  A negative result may occur with  improper specimen collection / handling, submission of specimen other  than nasopharyngeal swab, presence of viral mutation(s) within the  areas targeted by this assay, and inadequate number of viral copies  (<250 copies / mL). A negative result must be combined with clinical  observations, patient history, and epidemiological information. If result is POSITIVE SARS-CoV-2 target nucleic acids are DETECTED. T he SARS-CoV-2 RNA is generally detectable in upper and lower  respiratory specimens during the acute phase of infection.  Positive  results are indicative of active infection with SARS-CoV-2.  Clinical  correlation with patient history and other diagnostic information is  necessary to determine patient infection status.  Positive results do  not rule out bacterial infection or co-infection with other viruses. If result is PRESUMPTIVE POSTIVE SARS-CoV-2 nucleic acids MAY BE PRESENT.   A presumptive positive result was obtained on the submitted specimen  and confirmed on repeat testing.  While 2019 novel coronavirus  (SARS-CoV-2) nucleic acids may be present in the submitted sample  additional confirmatory testing may be necessary for epidemiological  and / or clinical management purposes  to differentiate between  SARS-CoV-2 and other Sarbecovirus currently known to infect humans.  If clinically indicated additional testing with an alternate test  methodology [phone]) is  advised. The SARS-CoV-2 RNA is generally  detectable  in upper and lower respiratory specimens during the acute  phase of infection. The expected result is Negative. Fact Sheet for Patients:  StrictlyIdeas.no Fact Sheet for Healthcare Providers: BankingDealers.co.za This test is not yet approved or cleared by the [place] FDA and has been authorized for detection and/or diagnosis of SARS-CoV-2 by FDA under an Emergency Use Authorization (EUA).  This EUA will remain in effect (meaning this test can be used) for the duration of the COVID-19 declaration under Section 564(b)(1) of the Act, 21 U.S.C. section 360bbb-3(b)(1), unless the authorization is terminated or revoked sooner. Performed at [facility], [phone]  [address] [place], [place] [address]   Blood Culture (routine x 2)     Status: None   Collection Time: 04/09/19  2:25 PM   Specimen: Left Antecubital; Blood  Result Value Ref Range Status   Specimen Description   Final    LEFT ANTECUBITAL BOTTLES DRAWN AEROBIC AND ANAEROBIC   Special Requests Blood Culture adequate volume  Final   Culture   Final    NO GROWTH 5 DAYS Performed at [facility], [address], [place], [place] [address]    Report Status 04/14/2019 FINAL  Final  Blood Culture (routine x 2)     Status: None   Collection Time: 04/09/19  2:26 PM   Specimen: BLOOD LEFT HAND  Result Value Ref Range Status   Specimen Description   Final    BLOOD LEFT HAND BOTTLES DRAWN AEROBIC AND ANAEROBIC   Special Requests Blood Culture adequate volume  Final   Culture   Final    NO GROWTH 5 DAYS Performed at [facility], [address] [place], [place] [address]    Report Status 04/14/2019 FINAL  Final  MRSA PCR Screening     Status: None   Collection Time: 04/09/19 11:55 PM   Specimen: Nasopharyngeal  Result Value Ref Range Status   MRSA by PCR NEGATIVE NEGATIVE Final    Comment:        The GeneXpert MRSA Assay (FDA approved for NASAL specimens only), is one component of  a comprehensive MRSA colonization surveillance program. It is not intended to diagnose MRSA infection nor to guide or monitor treatment for MRSA infections. Performed at [facility], [place] [address], [place],  [address]     Radiology Reports Dg Chest Port 1 View  Result Date: 04/09/2019 CLINICAL DATA:  Onset respiratory distress today. COVID-19 exposure. EXAM: PORTABLE CHEST 1 VIEW COMPARISON:  PA and lateral chest 01/26/2019.  CT chest 12/28/2018. FINDINGS: Lung volumes are low. There is patchy bilateral airspace disease. Heart size is normal. No pneumothorax or pleural effusion. IMPRESSION: Patchy bilateral airspace disease worrisome for pneumonia including atypical/viral infection. Electronically Signed   By: Inge Rise M.D.   On: 04/09/2019 13:51      Phillips Climes M.D on 04/19/2019 at 11:11 AM  Between 7am to 7pm - Pager - [phone]  After 7pm go to [email or web address] - password [facility]  [facility] -  Office  [phone]

## 2019-04-19 NOTE — Progress Notes (Signed)
ANTICOAGULATION CONSULT NOTE - Follow Up Consult  Pharmacy Consult for warfarin Indication: history of pulmonary embolus and DVT  Allergies  Allergen Reactions  . Bee Venom Anaphylaxis, Shortness Of Breath and Swelling  . Other Shortness Of Breath    Itching, rash with IVP DYE, iodine, shellfish LATEX  . Penicillins Anaphylaxis and Shortness Of Breath    Has patient had a PCN reaction causing immediate rash, facial/tongue/throat swelling, SOB or lightheadedness with hypotension: Yes Has patient had a PCN reaction causing severe rash involving mucus membranes or skin necrosis: No Has patient had a PCN reaction that required hospitalization No Has patient had a PCN reaction occurring within the last 10 years: No If all of the above answers are "NO", then may proceed with Cephalosporin use.   . Shellfish Allergy Nausea And Vomiting and Other (See Comments)    Feels like insides are twisting  . Iodinated Diagnostic Agents     Other reaction(s): RASH  . Iohexol      Code: RASH, Desc: PT WAS ON PREDNISONE FOR GOUT TX. @ TIME OF SCAN AND RECEIVED 50 MG OF BENADRYL IV-ARS 10/08/07   . Iodine Rash  . Latex Rash    Patient Measurements: Height: 6\' 3"  (190.5 cm) Weight: (!) 462 lb (209.6 kg) IBW/kg (Calculated) : 84.5   Vital Signs: Temp: 98.1 F (36.7 C) (10/21 0724) Temp Source: Oral (10/21 0724) BP: 107/71 (10/21 0724) Pulse Rate: 86 (10/21 0724)  Labs: Recent Labs    04/17/19 0330 04/17/19 1038 04/18/19 0315 04/19/19 0125  HGB 11.0* 10.9* 10.8* 10.3*  HCT 35.0* 32.0* 34.3* 33.0*  PLT 82*  --  75* 64*  LABPROT 33.1*  --  27.8* 23.9*  INR 3.3*  --  2.6* 2.2*  CREATININE 3.91*  --  3.75* 3.68*    Estimated Creatinine Clearance: 44.7 mL/min (A) (by C-G formula based on SCr of 3.68 mg/dL (H)).  Medications:  Scheduled:  . busPIRone  15 mg Oral BID  . Chlorhexidine Gluconate Cloth  6 each Topical Daily  . dexamethasone (DECADRON) injection  1 mg Intravenous Q24H  .  dextromethorphan-guaiFENesin  1 tablet Oral BID  . DULoxetine  60 mg Oral Daily  . fentaNYL  1 patch Transdermal Q72H  . furosemide  80 mg Oral BID  . insulin aspart  0-20 Units Subcutaneous TID WC  . insulin aspart  0-5 Units Subcutaneous QHS  . insulin aspart  6 Units Subcutaneous TID WC  . insulin glargine  60 Units Subcutaneous BID  . lamoTRIgine  200 mg Oral BID  . levETIRAcetam  1,000 mg Oral BID  . loratadine  10 mg Oral Daily  . pantoprazole  40 mg Oral Daily  . potassium chloride  40 mEq Oral Daily  . pregabalin  50 mg Oral TID  . simvastatin  20 mg Oral QHS  . spironolactone  25 mg Oral Daily  . tamsulosin  0.4 mg Oral QPC supper  . Warfarin - Pharmacist Dosing Inpatient   Does not apply q1800    Assessment: 53 yo male admitted on 04/09/2019 for SOB and found to be positive for COVID-19. Pharmacy is consulted to dose warfarin for history of DVT and PE. Patient is on warfarin prior to admission. On admission, INR was supratherapeutic and warfarin was been held for three days (10/11-10/13). Warfarin was resumed on 10/14 when INR was therapeutic.   Elevation may be related to drug interaction and poor oral intake (RN reports only liquid intake, no solid foods). H/H remains  low stable. Platelets down to 75 (baseline ~100-120k).  No bleeding or complications reported.  Significant drug interactions: Dexamethasone  Prior to Admission Warfarin Regimen: warfarin 5mg  daily  INR down to 2.2 today. We will increase dose back today.  Goal of Therapy:  INR 2-3 Monitor platelets by anticoagulation protocol: Yes   Plan:  Coumadin 5 mg PO today  Monitor daily INR, CBC, and S/S of bleeding   Onnie Boer, PharmD, BCIDP, AAHIVP, CPP Infectious Disease Pharmacist 04/19/2019 11:09 AM

## 2019-04-19 NOTE — Plan of Care (Signed)
Updated patient and wife on plan of care tonight, all questions answered

## 2019-04-19 NOTE — Progress Notes (Signed)
Physical Therapy Treatment Patient Details Name: [name] MRN: [id number] DOB: 24-Jun-1966 Today's Date: 04/19/2019    History of Present Illness 53 year old super morbidly obese Caucasian male with history of seizures, DM type II, depression, BPH, OSA, OHS on 2 to 3 L nasal cannula oxygen at home.  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  He was admitted to ICU    PT Comments    Attempted use of maxisky for transfer but lift did not accommodate patient. Had charged at battery several hours. Resorted to tenor lift for OOB as pt. Can safely attempt sitting on bed edge and exiting to recliner due to weakness and bed surface. Patient is  Able to sit self upright in recliner. Hopefully will be able to attempt standing from recliner next visit as  Standing and pivoting will a necessary activity for patient to be able to DC to home.   Follow Up Recommendations  Home health PT     Equipment Recommendations  None recommended by PT    Recommendations for Other Services       Precautions / Restrictions Precautions Precautions: Fall Precaution Comments: monitor Sats, on 12 L HFNC-finger, use  Tenor/gray lift pad., the maxisky did not pick him up, make  chair seat higher with pads. Restrictions Weight Bearing Restrictions: No    Mobility  Bed Mobility Overal bed mobility: Needs Assistance Bed Mobility: Rolling Rolling: Mod assist         General bed mobility comments: able to sit forward in long sitting in bed with max assist to pull with arms to remove lift pad.  Transfers Overall transfer level: Needs assistance Equipment used: Ambulation equipment used             General transfer comment: attempted lift  with maxisky but did not pick pt up. ? if charged enough. Used Tenor.  Ambulation/Gait                 Stairs              Wheelchair Mobility    Modified Rankin (Stroke Patients Only)       Balance       Sitting balance - Comments: sits in recliner, able to lean forward, operate leg rest.                                    Cognition Arousal/Alertness: Awake/alert Behavior During Therapy: WFL for tasks assessed/performed Overall Cognitive Status: Within Functional Limits for tasks assessed                                        Exercises General Exercises - Lower Extremity Long Arc Quad: AROM;Both;10 reps;Seated Hip Flexion/Marching: AROM;Both;5 reps    General Comments        Pertinent Vitals/Pain Pain Assessment: Faces Faces Pain Scale: Hurts little more Pain Location: generalized Pain Descriptors / Indicators: Discomfort Pain Intervention(s): Monitored during session    Home Living                      Prior Function            PT Goals (current goals can now be found in  the care plan section) Acute Rehab PT Goals Patient Stated Goal: to go home Progress towards PT goals: Progressing toward goals    Frequency    Min 3X/week      PT Plan Current plan remains appropriate    Co-evaluation PT/OT/SLP Co-Evaluation/Treatment: Yes Reason for Co-Treatment: For patient/therapist safety;To address functional/ADL transfers PT goals addressed during session: Mobility/safety with mobility OT goals addressed during session: ADL's and self-care;Strengthening/ROM      AM-PAC PT "6 Clicks" Mobility   Outcome Measure  Help needed turning from your back to your side while in a flat bed without using bedrails?: A Lot Help needed moving from lying on your back to sitting on the side of a flat bed without using bedrails?: A Lot Help needed moving to and from a bed to a chair (including a wheelchair)?: Total Help needed standing up from a chair using your arms (e.g., wheelchair or bedside chair)?: Total Help needed to walk in hospital  room?: Total Help needed climbing 3-5 steps with a railing? : Total 6 Click Score: 8    End of Session Equipment Utilized During Treatment: Oxygen Activity Tolerance: Patient tolerated treatment well Patient left: in chair;with call bell/phone within reach Nurse Communication: Mobility status;Need for lift equipment PT Visit Diagnosis: Muscle weakness (generalized) (M62.81)     Time: [id number] PT Time Calculation (min) (ACUTE ONLY): 45 min  Charges:  $Therapeutic Activity: 8-22 mins                     Tresa Endo PT Acute Rehabilitation Services  Office [phone]    Claretha Cooper 04/19/2019, 4:33 PM

## 2019-04-19 NOTE — Progress Notes (Signed)
  Speech Language Pathology Treatment: Dysphagia  Patient Details Name: [name] MRN: [id number] DOB: Oct 15, 1965 Today's Date: 04/19/2019 Time: [id number] SLP Time Calculation (min) (ACUTE ONLY): 20 min  Assessment / Plan / Recommendation Clinical Impression  Pt continues to require 15 liters HFNC; Sp02 93%.  He is more interactive today, just finished breakfast.  Provided with trials of nectar-thick liquid - consumed 4 oz with no s/s of aspiration.  Continues to cough consistently when drinking thin liquids.  Reviewed strategy for breath hold immediately prior to swallowing - with intermittent verbal cues, he followed through 10/10 trials with 50% reduction in cough response post-swallow.  GIven decreased s/s of aspiration with nectars, recommend changing liquids to this consistency.  SLP will follow for education/strategies; anticipate ongoing improvements.   HPI HPI: 53 year old super morbidly obese Caucasian male with history of seizures, DM type II, depression, BPH, OSA, OHS on 2 to 3 L nasal cannula oxygen at home.  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        SLP Plan  Continue with current plan of care       Recommendations  Diet recommendations: Regular;Nectar-thick liquid Liquids provided via: Cup Medication Administration: Whole meds with puree Supervision: Patient able to self feed Compensations: Slow rate;Small sips/bites Postural Changes and/or Swallow Maneuvers: Seated upright 90 degrees                Oral Care Recommendations: Oral care BID Follow up Recommendations: 24 hour supervision/assistance SLP Visit Diagnosis: Dysphagia, oropharyngeal phase (R13.12) Plan: Continue with current plan of care       GO               Fany Cavanaugh L. Tivis Ringer, [place] CCC/SLP Acute Rehabilitation Services Office number  [phone]   [name] 04/19/2019, 9:54 AM

## 2019-04-19 NOTE — Consult Note (Signed)
   [facility] CM Inpatient Consult   04/19/2019  [name] [birth year] [id number]   Follow up:  [name] [facility] ACO  Chart revie for wed for progress.  Currently, PT is recommending Home with HH.  Plan:  Will follow progress for disposition and needs extreme high risk score with inpatient [facility] team.  [name], RN BSN [facility] Liaison  [phone] business mobile phone Toll free office [phone]  Fax number: [phone] [name]

## 2019-04-20 DIAGNOSIS — N179 Acute kidney failure, unspecified: Secondary | ICD-10-CM | POA: Diagnosis not present

## 2019-04-20 DIAGNOSIS — R0902 Hypoxemia: Secondary | ICD-10-CM | POA: Diagnosis not present

## 2019-04-20 DIAGNOSIS — E118 Type 2 diabetes mellitus with unspecified complications: Secondary | ICD-10-CM | POA: Diagnosis not present

## 2019-04-20 DIAGNOSIS — U071 COVID-19: Secondary | ICD-10-CM | POA: Diagnosis not present

## 2019-04-20 LAB — PROTIME-INR
INR: 2 — ABNORMAL HIGH (ref 0.8–1.2)
Prothrombin Time: 22.3 seconds — ABNORMAL HIGH (ref 11.4–15.2)

## 2019-04-20 LAB — CBC
HCT: 32.5 % — ABNORMAL LOW (ref 39.0–52.0)
Hemoglobin: 10.2 g/dL — ABNORMAL LOW (ref 13.0–17.0)
MCH: 30.2 pg (ref 26.0–34.0)
MCHC: 31.4 g/dL (ref 30.0–36.0)
MCV: 96.2 fL (ref 80.0–100.0)
Platelets: 58 10*3/uL — ABNORMAL LOW (ref 150–400)
RBC: 3.38 MIL/uL — ABNORMAL LOW (ref 4.22–5.81)
RDW: 13.5 % (ref 11.5–15.5)
WBC: 3.6 10*3/uL — ABNORMAL LOW (ref 4.0–10.5)
nRBC: 0 % (ref 0.0–0.2)

## 2019-04-20 LAB — BRAIN NATRIURETIC PEPTIDE: B Natriuretic Peptide: 34.6 pg/mL (ref 0.0–100.0)

## 2019-04-20 LAB — COMPREHENSIVE METABOLIC PANEL
ALT: 19 U/L (ref 0–44)
AST: 28 U/L (ref 15–41)
Albumin: 2.2 g/dL — ABNORMAL LOW (ref 3.5–5.0)
Alkaline Phosphatase: 59 U/L (ref 38–126)
Anion gap: 13 (ref 5–15)
BUN: 93 mg/dL — ABNORMAL HIGH (ref 6–20)
CO2: 32 mmol/L (ref 22–32)
Calcium: 8 mg/dL — ABNORMAL LOW (ref 8.9–10.3)
Chloride: 95 mmol/L — ABNORMAL LOW (ref 98–111)
Creatinine, Ser: 3.6 mg/dL — ABNORMAL HIGH (ref 0.61–1.24)
GFR calc Af Amer: 21 mL/min — ABNORMAL LOW (ref >60)
GFR calc non Af Amer: 18 mL/min — ABNORMAL LOW (ref >60)
Glucose, Bld: 133 mg/dL — ABNORMAL HIGH (ref 70–99)
Potassium: 3.8 mmol/L (ref 3.5–5.1)
Sodium: 140 mmol/L (ref 135–145)
Total Bilirubin: 1.2 mg/dL (ref 0.3–1.2)
Total Protein: 6 g/dL — ABNORMAL LOW (ref 6.5–8.1)

## 2019-04-20 LAB — GLUCOSE, CAPILLARY
Glucose-Capillary: 76 mg/dL (ref 70–99)
Glucose-Capillary: 120 mg/dL — ABNORMAL HIGH (ref 70–99)
Glucose-Capillary: 227 mg/dL — ABNORMAL HIGH (ref 70–99)
Glucose-Capillary: 201 mg/dL — ABNORMAL HIGH (ref 70–99)

## 2019-04-20 LAB — C-REACTIVE PROTEIN: CRP: 0.8 mg/dL (ref <1.0)

## 2019-04-20 LAB — IMMATURE PLATELET FRACTION: Immature Platelet Fraction: 3.9 % (ref 1.2–8.6)

## 2019-04-20 MED ORDER — POLYETHYLENE GLYCOL 3350 17 G PO PACK
17.0000 g | PACK | Freq: Two times a day (BID) | ORAL | Status: AC
  Administered 2019-04-20: 17 g via ORAL
  Filled 2019-04-20: qty 1

## 2019-04-20 MED ORDER — WARFARIN SODIUM 5 MG PO TABS
5.0000 mg | ORAL_TABLET | Freq: Once | ORAL | Status: AC
  Administered 2019-04-20: 5 mg via ORAL
  Filled 2019-04-20: qty 1

## 2019-04-20 NOTE — Progress Notes (Signed)
OT Treatment Note  Pt initially tearful at beginning of session, not wanting to participate with therapy. After discussion regarding pt's goal to return home, pt worked with OT/PT and able to achieve partial standing position in preparation for functional transfers. Completed grooming tasks at bed level with set up. Incentive spirometer and flutter valve x 10 each - pt reports he is completing these on his own.  Session completed on 8L. Pt left on 6L with SpO2 in the mid -high 90s. Pt eager to return home but will most likely need non-emergent ambulance transport as he is unable to stand and pivot at this time. Recommend 24/7 S and HHOT.    04/20/19 1547  OT Visit Information  Last OT Received On 04/20/19  Assistance Needed +2  PT/OT/SLP Co-Evaluation/Treatment Yes  Reason for Co-Treatment Complexity of the patient's impairments (multi-system involvement);For patient/therapist safety;To address functional/ADL transfers  OT goals addressed during session ADL's and self-care;Strengthening/ROM  History of Present Illness 53 year old super morbidly obese Caucasian male with history of seizures, DM type II, depression, BPH, OSA, OHS on 2 to 3 L nasal cannula oxygen at home.  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  He was admitted to ICU  Precautions  Precautions Fall  Precaution Comments monitor Sats, on 8 L HFNC-finger, . tenor, ,made  chair seat higher with pads.placed on 6 L HFNC at end of session  Pain Assessment  Pain Assessment Faces  Faces Pain Scale 4  Pain Location back  Pain Descriptors / Indicators Discomfort;Crying  Pain Intervention(s) Limited activity within patient's tolerance;Repositioned  Cognition  Arousal/Alertness Awake/alert  Behavior During Therapy  (emotional/crying at times)  Overall Cognitive Status Within Functional Limits for tasks  assessed  Upper Extremity Assessment  Upper Extremity Assessment Generalized weakness  Lower Extremity Assessment  Lower Extremity Assessment Defer to PT evaluation  ADL  Overall ADL's  Needs assistance/impaired  Grooming Set up;Bed level  Upper Body Bathing Minimal assistance;Bed level  Functional mobility during ADLs Maximal assistance;+2 for physical assistance  General ADL Comments Used bed rail to help pull up form sitting in recliner; pt using momentum to rock  Bed Mobility  Overal bed mobility Needs Assistance  Bed Mobility Rolling  Rolling Mod assist  General bed mobility comments able to assist with rolling to remove tenor lift pad  Balance  Overall balance assessment Needs assistance  Sitting-balance support Feet supported;No upper extremity supported  Sitting balance-Leahy Scale Good  Sitting balance - Comments sits in recliner, able to lean forward, operate leg rest.  Standing balance-Leahy Scale Poor  Transfers  Overall transfer level Needs assistance  Transfer via [place] to/from Stand  Sit to Stand Max assist;+2 physical assistance  General transfer comment Pt in recliner, turned to face bed rail. patient  did stand very few seconds x 3 with much encouragement.  General Comments  General comments (skin integrity, edema, etc.) Required encouragement ot participate due to back pain and wanting to go back to bed. Increased time spent talking with pt about his goals to DC home and need to work with OT/PT to achieve his goals.  Other Exercises  Other Exercises incentive spirometer x 10 - able to pull 535ml  Other Exercises flutter valve x 10  OT - End of Session  Equipment Utilized During Treatment Oxygen (6L)  Activity Tolerance Patient tolerated treatment well  Patient  left in bed;with call bell/phone within reach  Nurse Communication Mobility status;Need for lift equipment  OT Assessment/Plan  OT Plan Discharge plan remains  appropriate  OT Visit Diagnosis Other abnormalities of gait and mobility (R26.89);Muscle weakness (generalized) (M62.81);Pain  Pain - part of body  (back)  OT Frequency (ACUTE ONLY) Min 3X/week  Follow Up Recommendations Supervision/Assistance - 24 hour;SNF;Home health OT  OT Equipment Other (comment);Hospital bed  AM-PAC OT "6 Clicks" Daily Activity Outcome Measure (Version 2)  Help from another person eating meals? 4  Help from another person taking care of personal grooming? 3  Help from another person toileting, which includes using toliet, bedpan, or urinal? 1  Help from another person bathing (including washing, rinsing, drying)? 2  Help from another person to put on and taking off regular upper body clothing? 3  Help from another person to put on and taking off regular lower body clothing? 1  6 Click Score 14  OT Goal Progression  Progress towards OT goals Progressing toward goals  Acute Rehab OT Goals  Patient Stated Goal to go home  OT Goal Formulation With patient  Time For Goal Achievement 04/28/19  Potential to Achieve Goals Good  ADL Goals  Pt Will Perform Upper Body Bathing with set-up;sitting  Pt Will Perform Lower Body Bathing with min assist;sit to/from stand;with adaptive equipment  Pt Will Transfer to Toilet with mod assist;with +2 assist;bedside commode;stand pivot transfer  Pt Will Perform Toileting - Clothing Manipulation and hygiene with min assist;with adaptive equipment;sit to/from stand;sitting/lateral leans  Additional ADL Goal #1 Pt will demonstrate pursed lip breathing technique with ADL with SpO2 remaining above 88.  OT Time Calculation  OT Start Time (ACUTE ONLY) 1034  OT Stop Time (ACUTE ONLY) 1116  OT Time Calculation (min) 42 min  OT General Charges  $OT Visit 1 Visit  OT Treatments  $Self Care/Home Management  23-37 mins  Maurie Boettcher, OT/L   Acute OT Clinical Specialist [place] Pager [phone] Office [phone]

## 2019-04-20 NOTE — Progress Notes (Signed)
PROGRESS NOTE                                                                                                                                                                                                             Patient Demographics:    [name], is a 53 y.o. male, DOB - [birth year], [id number]  Admit date - 04/09/2019   Admitting Physician Ejiroghene Arlyce Dice, MD  Outpatient Primary MD for the patient is Sharion Balloon, FNP  LOS - 36   Chief Complaint  Patient presents with  . Respiratory Distress       Brief Narrative    53 year old super morbidly obese Caucasian male with history of seizures, DM type II, depression, BPH, OSA, OHS on 2 to 3 L nasal cannula oxygen at home.  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  He was admitted to ICU stabilized and transferred to hospitalist service on 04/14/2019.   Subjective:    [name] today denies any headache, nausea, vomiting, difficult improvement of oxygen requirement.   Assessment  & Plan :    Active Problems:   OSA (obstructive sleep apnea)   Obesity hypoventilation syndrome (HCC)   Pneumonia due to COVID-19 virus   Pressure injury of skin   Chronic respiratory failure with hypoxia (HCC)   History of pulmonary embolus (PE)   Morbid obesity with BMI of 60.0-69.9, adult (HCC)   Seizures (HCC)   Diabetes mellitus type 2, uncontrolled, with complications (HCC)   Acute renal failure superimposed on stage 3b chronic kidney disease ([place])  Acute on chronic hypoxic Resp. Failure due to Acute Covid 19 Viral Pneumonitis during the ongoing 2020 Covid 19 Pandemic  -Patient  with significant oxygen requirement, but significant provement overnight, down from 15> 8 L nasal cannula. -I have encouraged patient again today about incentive spirometry,  flutter valve use, and remain out of bed to chair, proning will be difficult given his morbid obesity. -On Decadron, tapering gradually . -  Also is developing atelectasis from his bedbound status and not moving out of bed compounded by his super morbid obesity.  Encouraged to sit up in chair in the daytime and use I-S and flutter valve for pulmonary toiletry.  Continue  monitoring clinically. -Repeat chest x-ray 10/21 showing some mild improvement of his COVID-19 for pneumonia  COVID-19 Labs  Recent Labs    04/18/19 0315 04/19/19 0125 04/20/19 0215  DDIMER 0.42  --   --   CRP 2.3* 1.4* 0.8    Lab Results  Component Value Date   SARSCOV2NAA POSITIVE (A) 04/09/2019   OSA/OHS  - currently not on CPAP at home.  Thrombocytopenia -Appears to be chronic, baseline platelet count 90-100K, but it does appear to be worsening during hospital stay, no evidence of hemolysis as bili count within normal limit, discussed with lab, no evidence of clumping, is most likely due to COVID-19 for pneumonia, will continue to monitor closely especially he is on warfarin.  History of PE and DVT.  - On Coumadin INR therapeutic.  Pharmacy monitoring.  Hx seizure  - Stable, continue combination of Keppra and Lamictal.   Dyslipidemia.   - On Zocor.  Morbid obesity BMI of 57.   - Outpatient follow-up PCP for weight loss.  Deconditioning.   - PT OT consulted.  Sit to sit up in chair and daytime.  Depression and bipolar disorder.  - Continue buspirone, Cymbalta and Lyrica combination.  AKI on CKD 4.   - Baseline creatinine 3.5.  Initially had AKI due to ATN now improving with ongoing diuresis with oral Lasix 80 mg twice daily.  Monitor renal function closely with ongoing diuresis.  DM type II.   - On Lantus and sliding scale combination dose adjusted 10/18.  Outpatient glycemic control is slightly poor due to hyperglycemia.  A1c was 7.9   Code Status :Full  Family Communication  :  Discussed with patient  Disposition Plan  : Pending further work-up  Barriers For Discharge : Remains on 8 L nasal cannula  Consults  : None  Procedures  : None  DVT Prophylaxis  :  On warfarin  Lab Results  Component Value Date   PLT 58 (L) 04/20/2019    Antibiotics  :    Anti-infectives (From admission, onward)   Start     Dose/Rate Route Frequency Ordered Stop   04/11/19 1000  remdesivir 100 mg in sodium chloride 0.9 % 250 mL IVPB     100 mg 500 mL/hr over 30 Minutes Intravenous Every 24 hours 04/10/19 0000 04/14/19 1035   04/10/19 0100  remdesivir 200 mg in sodium chloride 0.9 % 250 mL IVPB     200 mg 500 mL/hr over 30 Minutes Intravenous Once 04/10/19 0000 04/10/19 0138        Objective:   Vitals:   04/20/19 0407 04/20/19 0500 04/20/19 0657 04/20/19 0818  BP: 124/66   118/77  Pulse: 74 71 72   Resp: 18   18  Temp: 98.5 F (36.9 C)   97.7 F (36.5 C)  TempSrc: Oral   Oral  SpO2: 99% 95% 96%   Weight:      Height:        Wt Readings from Last 3 Encounters:  04/10/19 (!) 209.6 kg  02/08/19 (!) 205.2 kg  11/03/17 (!) 205.2 kg     Intake/Output Summary (Last 24 hours) at 04/20/2019 1027 Last data filed at 04/20/2019 0555 Gross per 24 hour  Intake 1360 ml  Output 2970 ml  Net -1610 ml     Physical Exam  Awake Alert, Oriented X 3, No new F.N deficits, Normal affect, sitting in recliner in no apparent distress Symmetrical Chest wall movement, Good air movement bilaterally, CTAB RRR,No Gallops,Rubs or  new Murmurs, No Parasternal Heave +ve B.Sounds, Abd Soft, No tenderness, No rebound - guarding or rigidity. No Cyanosis, Clubbing , +1  edema, chronic lower skin discoloration secondary to venous stasis    Data Review:    CBC Recent Labs  Lab 04/15/19 0349 04/16/19 0307 04/17/19 0330 04/17/19 1038 04/18/19 0315 04/19/19 0125 04/20/19 0215  WBC 3.2* 2.5* 3.0*  --  2.9* 2.5* 3.6*  HGB 11.2* 10.8* 11.0* 10.9* 10.8* 10.3* 10.2*  HCT 34.8*  34.3* 35.0* 32.0* 34.3* 33.0* 32.5*  PLT 86* 81* 82*  --  75* 64* 58*  MCV 93.0 94.2 94.9  --  95.3 95.4 96.2  MCH 29.9 29.7 29.8  --  30.0 29.8 30.2  MCHC 32.2 31.5 31.4  --  31.5 31.2 31.4  RDW 14.1 14.4 14.2  --  13.9 13.8 13.5  LYMPHSABS 0.5* 0.4* 0.6*  --  0.5* 0.4*  --   MONOABS 0.2 0.2 0.3  --  0.3 0.2  --   EOSABS 0.0 0.0 0.1  --  0.0 0.0  --   BASOSABS 0.0 0.0 0.0  --  0.0 0.0  --     Chemistries  Recent Labs  Lab 04/15/19 0349 04/16/19 0307 04/17/19 0330 04/17/19 1038 04/18/19 0315 04/19/19 0125 04/20/19 0215  NA 144 145 140 141 141 137 140  K 3.6 4.2 4.0 3.4* 4.3 4.3 3.8  CL 102 103 98  --  98 95* 95*  CO2 30 31 29   --  32 31 32  GLUCOSE 147* 238* 158*  --  166* 191* 133*  BUN 123* 104* 111*  --  97* 96* 93*  CREATININE 3.97* 4.19* 3.91*  --  3.75* 3.68* 3.60*  CALCIUM 8.1* 8.3* 8.1*  --  8.1* 8.0* 8.0*  MG 2.0 2.1 2.1  --  2.1 2.2  --   AST 51* 41 32  --  31 30 28   ALT 29 26 24   --  23 20 19   ALKPHOS 76 73 70  --  64 63 59  BILITOT 1.1 1.0 1.2  --  1.2 1.0 1.2   ------------------------------------------------------------------------------------------------------------------ No results for input(s): CHOL, HDL, LDLCALC, TRIG, CHOLHDL, LDLDIRECT in the last 72 hours.  Lab Results  Component Value Date   HGBA1C 7.9 (H) 04/10/2019   ------------------------------------------------------------------------------------------------------------------ No results for input(s): TSH, T4TOTAL, T3FREE, THYROIDAB in the last 72 hours.  Invalid input(s): FREET3 ------------------------------------------------------------------------------------------------------------------ No results for input(s): VITAMINB12, FOLATE, FERRITIN, TIBC, IRON, RETICCTPCT in the last 72 hours.  Coagulation profile Recent Labs  Lab 04/16/19 0307 04/17/19 0330 04/18/19 0315 04/19/19 0125 04/20/19 0215  INR 3.5* 3.3* 2.6* 2.2* 2.0*    Recent Labs    04/18/19 0315  DDIMER 0.42     Cardiac Enzymes No results for input(s): CKMB, TROPONINI, MYOGLOBIN in the last 168 hours.  Invalid input(s): CK ------------------------------------------------------------------------------------------------------------------    Component Value Date/Time   BNP 34.6 04/20/2019 0215    Inpatient Medications  Scheduled Meds: . busPIRone  15 mg Oral BID  . Chlorhexidine Gluconate Cloth  6 each Topical Daily  . dexamethasone (DECADRON) injection  1 mg Intravenous Q24H  . dextromethorphan-guaiFENesin  1 tablet Oral BID  . DULoxetine  60 mg Oral Daily  . fentaNYL  1 patch Transdermal Q72H  . furosemide  80 mg Oral BID  . insulin aspart  0-20 Units Subcutaneous TID WC  . insulin aspart  0-5 Units Subcutaneous QHS  . insulin aspart  6 Units Subcutaneous TID WC  . insulin glargine  60 Units Subcutaneous BID  . lamoTRIgine  200 mg Oral BID  . levETIRAcetam  1,000 mg Oral BID  . loratadine  10 mg Oral Daily  . pantoprazole  40 mg Oral Daily  . polyethylene glycol  17 g Oral BID  . potassium chloride  40 mEq Oral Daily  . pregabalin  50 mg Oral TID  . simvastatin  20 mg Oral QHS  . spironolactone  25 mg Oral Daily  . tamsulosin  0.4 mg Oral QPC supper  . warfarin  5 mg Oral ONCE-1800  . Warfarin - Pharmacist Dosing Inpatient   Does not apply q1800   Continuous Infusions: . sodium chloride 10 mL/hr at 04/13/19 1200   PRN Meds:.sodium chloride, acetaminophen, albuterol, food thickener, guaiFENesin-dextromethorphan, HYDROcodone-acetaminophen, [DISCONTINUED] ondansetron **OR** ondansetron (ZOFRAN) IV, polyethylene glycol, Resource ThickenUp Clear, sodium chloride  Micro Results No results found for this or any previous visit (from the past 240 hour(s)).  Radiology Reports Dg Chest Port 1 View  Result Date: 04/19/2019 CLINICAL DATA:  Hypoxia. COVID-19 pneumonia. EXAM: PORTABLE CHEST 1 VIEW COMPARISON:  One-view chest x-ray 04/09/2019 FINDINGS: Peripheral airspace opacities are  again seen bilaterally. Greatest densities in the right upper lobe. Overall aeration is improved. IMPRESSION: 1. Shifting appearance of extensive bilateral airspace disease compatible with pneumonia. 2. Overall aeration is improved with some residual disease in the right upper lobe. Electronically Signed   By: San Morelle M.D.   On: 04/19/2019 12:37   Dg Chest Port 1 View  Result Date: 04/09/2019 CLINICAL DATA:  Onset respiratory distress today. COVID-19 exposure. EXAM: PORTABLE CHEST 1 VIEW COMPARISON:  PA and lateral chest 01/26/2019.  CT chest 12/28/2018. FINDINGS: Lung volumes are low. There is patchy bilateral airspace disease. Heart size is normal. No pneumothorax or pleural effusion. IMPRESSION: Patchy bilateral airspace disease worrisome for pneumonia including atypical/viral infection. Electronically Signed   By: Inge Rise M.D.   On: 04/09/2019 13:51      Phillips Climes M.D on 04/20/2019 at 10:27 AM  Between 7am to 7pm - Pager - [phone]  After 7pm go to [email or web address] - password [facility]  [facility] -  Office  [phone]

## 2019-04-20 NOTE — Progress Notes (Signed)
Nutrition Follow-up  DOCUMENTATION CODES:   Morbid obesity  INTERVENTION:   Pt receiving Hormel Shake daily with Breakfast which provides 520 kcals and 22 g of protein and Magic cup BID with lunch and dinner, each supplement provides 290 kcal and 9 grams of protein, automatically on meal trays to optimize nutritional intake.   Encourage PO intake   NUTRITION DIAGNOSIS:   Increased nutrient needs related to (COVID-19 PNA) as evidenced by estimated needs. Ongoing.  GOAL:   Patient will meet greater than or equal to 90% of their needs Progressing.   MONITOR:   PO intake  REASON FOR ASSESSMENT:   Malnutrition Screening Tool    ASSESSMENT:   Pt with PMH of morbid obesity, sz, DM, depression, BPH, OSA, OHS on chronic 2L O2, PE, and DVT now admitted with COVID-19 PNA.    Pt on 8 L O2 via HFNC, O2 req decreased  Meal Completion: 50-100%  Therapy recommends outpatient therapy at d/c.   Medications reviewed and include: decadron, 80 mg lasix BID, 12 units novolog TID, 60 units lantus BID, miralax, 40 mEq KCl daily Labs reviewed:  CBG's: [phone]    Diet Order:   Diet Order            Diet regular Room service appropriate? Yes; Fluid consistency: Nectar Thick  Diet effective now              EDUCATION NEEDS:   No education needs have been identified at this time  Skin:  Skin Assessment: Skin Integrity Issues: Skin Integrity Issues:: Unstageable Unstageable: coccyx  Last BM:  10/21  Height:   Ht Readings from Last 1 Encounters:  04/10/19 6\' 3"  (1.905 m)    Weight:   Wt Readings from Last 1 Encounters:  04/10/19 (!) 209.6 kg    Ideal Body Weight:  89 kg  BMI:  Body mass index is 57.75 kg/m.  Estimated Nutritional Needs:   Kcal:  1900-2200  Protein:  125-140 grams  Fluid:  2 L/day  Maylon Peppers RD, LDN, CNSC [phone] Pager [phone] After Hours Pager

## 2019-04-20 NOTE — Progress Notes (Signed)
Physical Therapy Treatment Patient Details Name: [name] MRN: [id number] DOB: 05/29/1966 Today's Date: 04/20/2019    History of Present Illness 53 year old super morbidly obese Caucasian male with history of seizures, DM type II, depression, BPH, OSA, OHS on 2 to 3 L nasal cannula oxygen at home.  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  He was admitted to ICU    PT Comments    The patient tearful this AM, reports being tired and ready to return to bed. With much encouragement, patient did  Stand  Pulling up on  bed rail x 3 with 2 assisting . Patient reports that several family mebers( mother in law, aunt and grandmother are available). Continue PT efforts to stand and pivot. Patient will require Non emergency transport when.DC'd.Wife works so unsure as to the capabilities of remaining caregivers assisting.   Follow Up Recommendations  Home health PT;Supervision/Assistance - 24 hour     Equipment Recommendations  None recommended by PT(tenor like lift)    Recommendations for Other Services       Precautions / Restrictions Precautions Precaution Comments: monitor Sats, on 8 L HFNC-finger, . tenor, ,made  chair seat higher with pads.placed on 6 L HFNC at end of session    Mobility  Bed Mobility Overal bed mobility: Needs Assistance Bed Mobility: Rolling Rolling: Mod assist         General bed mobility comments: able to use legs and reach to roll, slides self up in bed with [facility] board  Transfers Overall transfer level: Needs assistance   Transfers: Sit to/from Stand           General transfer comment: Pt in recliner, turned to face bed rail. patient  did stand very few seconds x 3 with much encouragement.  Ambulation/Gait                 Stairs             Wheelchair Mobility    Modified Rankin (Stroke Patients  Only)       Balance Overall balance assessment: Needs assistance Sitting-balance support: Feet supported;No upper extremity supported Sitting balance-Leahy Scale: Good Sitting balance - Comments: sits in recliner, able to lean forward, operate leg rest.                                    Cognition                                       General Comments: pt. emotional, reports he is tired, his back hurts, much encouragement to attempt standing from recliner.      Exercises      General Comments        Pertinent Vitals/Pain Faces Pain Scale: Hurts little more Pain Location: generalized Pain Descriptors / Indicators: Discomfort Pain Intervention(s): Monitored during session    Home Living                      Prior Function            PT Goals (current goals can now be found in the care plan section)      Frequency    Min 3X/week  PT Plan Current plan remains appropriate    Co-evaluation PT/OT/SLP Co-Evaluation/Treatment: Yes Reason for Co-Treatment: Complexity of the patient's impairments (multi-system involvement);For patient/therapist safety          AM-PAC PT "6 Clicks" Mobility   Outcome Measure  Help needed turning from your back to your side while in a flat bed without using bedrails?: A Lot Help needed moving from lying on your back to sitting on the side of a flat bed without using bedrails?: A Lot Help needed moving to and from a bed to a chair (including a wheelchair)?: Total Help needed standing up from a chair using your arms (e.g., wheelchair or bedside chair)?: Total Help needed to walk in hospital room?: Total Help needed climbing 3-5 steps with a railing? : Total 6 Click Score: 8    End of Session Equipment Utilized During Treatment: Oxygen Activity Tolerance: Patient tolerated treatment well Patient left: in bed;with call bell/phone within reach Nurse Communication: Mobility status;Need  for lift equipment(tenor) PT Visit Diagnosis: Muscle weakness (generalized) (M62.81)     Time: [id number] PT Time Calculation (min) (ACUTE ONLY): 48 min  Charges:  $Therapeutic Activity: 8-22 mins                     [name] PT Acute Rehabilitation Services  Office [phone]    [name] 04/20/2019, 3:49 PM

## 2019-04-21 DIAGNOSIS — N179 Acute kidney failure, unspecified: Secondary | ICD-10-CM | POA: Diagnosis not present

## 2019-04-21 DIAGNOSIS — N1832 Chronic kidney disease, stage 3b: Secondary | ICD-10-CM | POA: Diagnosis not present

## 2019-04-21 DIAGNOSIS — U071 COVID-19: Secondary | ICD-10-CM | POA: Diagnosis not present

## 2019-04-21 DIAGNOSIS — J9611 Chronic respiratory failure with hypoxia: Secondary | ICD-10-CM | POA: Diagnosis not present

## 2019-04-21 LAB — TECHNOLOGIST SMEAR REVIEW

## 2019-04-21 LAB — CBC
HCT: 33.8 % — ABNORMAL LOW (ref 39.0–52.0)
Hemoglobin: 10.4 g/dL — ABNORMAL LOW (ref 13.0–17.0)
MCH: 29.5 pg (ref 26.0–34.0)
MCHC: 30.8 g/dL (ref 30.0–36.0)
MCV: 95.8 fL (ref 80.0–100.0)
Platelets: 59 10*3/uL — ABNORMAL LOW (ref 150–400)
RBC: 3.53 MIL/uL — ABNORMAL LOW (ref 4.22–5.81)
RDW: 13.4 % (ref 11.5–15.5)
WBC: 4.2 10*3/uL (ref 4.0–10.5)
nRBC: 0 % (ref 0.0–0.2)

## 2019-04-21 LAB — COMPREHENSIVE METABOLIC PANEL
ALT: 22 U/L (ref 0–44)
AST: 32 U/L (ref 15–41)
Albumin: 2.3 g/dL — ABNORMAL LOW (ref 3.5–5.0)
Alkaline Phosphatase: 65 U/L (ref 38–126)
Anion gap: 11 (ref 5–15)
BUN: 84 mg/dL — ABNORMAL HIGH (ref 6–20)
CO2: 33 mmol/L — ABNORMAL HIGH (ref 22–32)
Calcium: 8.1 mg/dL — ABNORMAL LOW (ref 8.9–10.3)
Chloride: 95 mmol/L — ABNORMAL LOW (ref 98–111)
Creatinine, Ser: 3.39 mg/dL — ABNORMAL HIGH (ref 0.61–1.24)
GFR calc Af Amer: 23 mL/min — ABNORMAL LOW (ref >60)
GFR calc non Af Amer: 20 mL/min — ABNORMAL LOW (ref >60)
Glucose, Bld: 69 mg/dL — ABNORMAL LOW (ref 70–99)
Potassium: 4 mmol/L (ref 3.5–5.1)
Sodium: 139 mmol/L (ref 135–145)
Total Bilirubin: 0.7 mg/dL (ref 0.3–1.2)
Total Protein: 6.4 g/dL — ABNORMAL LOW (ref 6.5–8.1)

## 2019-04-21 LAB — PROTIME-INR
INR: 2.4 — ABNORMAL HIGH (ref 0.8–1.2)
Prothrombin Time: 25.8 seconds — ABNORMAL HIGH (ref 11.4–15.2)

## 2019-04-21 LAB — GLUCOSE, CAPILLARY
Glucose-Capillary: 73 mg/dL (ref 70–99)
Glucose-Capillary: 130 mg/dL — ABNORMAL HIGH (ref 70–99)
Glucose-Capillary: 204 mg/dL — ABNORMAL HIGH (ref 70–99)
Glucose-Capillary: 222 mg/dL — ABNORMAL HIGH (ref 70–99)

## 2019-04-21 MED ORDER — INSULIN GLARGINE 100 UNIT/ML ~~LOC~~ SOLN
50.0000 [IU] | Freq: Two times a day (BID) | SUBCUTANEOUS | Status: DC
  Administered 2019-04-21 – 2019-04-23 (×4): 50 [IU] via SUBCUTANEOUS
  Filled 2019-04-21 (×4): qty 0.5

## 2019-04-21 MED ORDER — WARFARIN SODIUM 4 MG PO TABS
4.0000 mg | ORAL_TABLET | Freq: Once | ORAL | Status: AC
  Administered 2019-04-21: 4 mg via ORAL
  Filled 2019-04-21: qty 1

## 2019-04-21 NOTE — Progress Notes (Signed)
PROGRESS NOTE                                                                                                                                                                                                             Patient Demographics:    [name], is a 53 y.o. male, DOB - 12-Feb-1966, [id number]  Admit date - 04/09/2019   Admitting Physician Ejiroghene Arlyce Dice, MD  Outpatient Primary MD for the patient is Sharion Balloon, FNP  LOS - 12   Chief Complaint  Patient presents with  . Respiratory Distress       Brief Narrative    53 year old super morbidly obese Caucasian male with history of seizures, DM type II, depression, BPH, OSA, OHS on 2 to 3 L nasal cannula oxygen at home.  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  He was admitted to ICU stabilized and transferred to hospitalist service on 04/14/2019.   Subjective:    [name] today denies any headache, nausea, vomiting, reports cough and dyspnea significantly improved.   Assessment  & Plan :    Active Problems:   OSA (obstructive sleep apnea)   Obesity hypoventilation syndrome (HCC)   Pneumonia due to COVID-19 virus   Pressure injury of skin   Chronic respiratory failure with hypoxia (HCC)   History of pulmonary embolus (PE)   Morbid obesity with BMI of 60.0-69.9, adult (HCC)   Seizures (HCC)   Diabetes mellitus type 2, uncontrolled, with complications (HCC)   Acute renal failure superimposed on stage 3b chronic kidney disease ([place])  Acute on chronic hypoxic Resp. Failure due to Acute Covid 19 Viral Pneumonitis during the ongoing 2020 Covid 19 Pandemic  -Patient with improvement of oxygen requirement, at one point was 15 L nasal cannula before 2 days, this morning he is on 4 L nasal cannula, at baseline he is on 2 to 4 L oxygen . -I  have encouraged patient again today about incentive spirometry, flutter valve use, and remain out of bed to chair, proning will be difficult given his morbid obesity. -Treated with Decadron -  Also is developing atelectasis from his bedbound status and not moving out of bed compounded by his super morbid obesity.  Encouraged  to sit up in chair in the daytime and use I-S and flutter valve for pulmonary toiletry.  Continue monitoring clinically. -Repeat chest x-ray 10/21 showing some mild improvement of his COVID-19 for pneumonia  COVID-19 Labs  Recent Labs    04/19/19 0125 04/20/19 0215  CRP 1.4* 0.8    Lab Results  Component Value Date   SARSCOV2NAA POSITIVE (A) 04/09/2019   OSA/OHS  - currently not on CPAP at home.  Thrombocytopenia -Appears to be chronic, baseline platelet count 90-100K, but it does appear to be worsening during hospital stay, no evidence of hemolysis as bili count within normal limit, discussed with lab, no evidence of clumping, is most likely due to COVID-19 for pneumonia, will continue to monitor closely especially he is on warfarin.  History of PE and DVT.  - On Coumadin INR therapeutic.  Pharmacy monitoring.  Hx seizure  - Stable, continue combination of Keppra and Lamictal.   Dyslipidemia.   - On Zocor.  Morbid obesity BMI of 57.   - Outpatient follow-up PCP for weight loss.  Deconditioning.   - PT OT consulted.  Sit to sit up in chair and daytime.  Depression and bipolar disorder.  - Continue buspirone, Cymbalta and Lyrica combination.  AKI on CKD 4.   - Baseline creatinine 3.5.  Initially had AKI due to ATN now improving with ongoing diuresis with oral Lasix 80 mg twice daily.  Monitor renal function closely with ongoing diuresis.  DM type II.   - On Lantus and sliding scale combination dose adjusted 10/18.  Outpatient glycemic control is slightly poor due to hyperglycemia.  A1c was 7.9, lowered Lantus to 50 units subcu twice daily given  morning CBG is 74.  We will DC Foley catheter today, will have SLP to reevaluate given he remains on nectar thick liquid.   Code Status :Full  Family Communication  : Discussed with patient, left wife via voicemail via phone.  Disposition Plan  : Home with Home health   Consults  : None  Procedures  : None  DVT Prophylaxis  :  On warfarin  Lab Results  Component Value Date   PLT 59 (L) 04/21/2019    Antibiotics  :    Anti-infectives (From admission, onward)   Start     Dose/Rate Route Frequency Ordered Stop   04/11/19 1000  remdesivir 100 mg in sodium chloride 0.9 % 250 mL IVPB     100 mg 500 mL/hr over 30 Minutes Intravenous Every 24 hours 04/10/19 0000 04/14/19 1035   04/10/19 0100  remdesivir 200 mg in sodium chloride 0.9 % 250 mL IVPB     200 mg 500 mL/hr over 30 Minutes Intravenous Once 04/10/19 0000 04/10/19 0138        Objective:   Vitals:   04/21/19 0000 04/21/19 0100 04/21/19 0200 04/21/19 0408  BP:    130/64  Pulse: 71 77 70 73  Resp: 18 16 16 18   Temp:    98.5 F (36.9 C)  TempSrc:    Oral  SpO2: 97% 92% 98% 94%  Weight:      Height:        Wt Readings from Last 3 Encounters:  04/10/19 (!) 209.6 kg  02/08/19 (!) 205.2 kg  11/03/17 (!) 205.2 kg     Intake/Output Summary (Last 24 hours) at 04/21/2019 1154 Last data filed at 04/21/2019 0400 Gross per 24 hour  Intake 880 ml  Output 1810 ml  Net -930 ml  Physical Exam  Awake Alert, Oriented X 3, No new F.N deficits, Normal affect Symmetrical Chest wall movement, Good air movement bilaterally, CTAB RRR,No Gallops,Rubs or new Murmurs, No Parasternal Heave +ve B.Sounds, Abd Soft, No tenderness, No rebound - guarding or rigidity. No Cyanosis, Clubbing , +1  edema, chronic lower skin discoloration secondary to venous stasis    Data Review:    CBC Recent Labs  Lab 04/15/19 0349 04/16/19 0307 04/17/19 0330 04/17/19 1038 04/18/19 0315 04/19/19 0125 04/20/19 0215 04/21/19 0605   WBC 3.2* 2.5* 3.0*  --  2.9* 2.5* 3.6* 4.2  HGB 11.2* 10.8* 11.0* 10.9* 10.8* 10.3* 10.2* 10.4*  HCT 34.8* 34.3* 35.0* 32.0* 34.3* 33.0* 32.5* 33.8*  PLT 86* 81* 82*  --  75* 64* 58* 59*  MCV 93.0 94.2 94.9  --  95.3 95.4 96.2 95.8  MCH 29.9 29.7 29.8  --  30.0 29.8 30.2 29.5  MCHC 32.2 31.5 31.4  --  31.5 31.2 31.4 30.8  RDW 14.1 14.4 14.2  --  13.9 13.8 13.5 13.4  LYMPHSABS 0.5* 0.4* 0.6*  --  0.5* 0.4*  --   --   MONOABS 0.2 0.2 0.3  --  0.3 0.2  --   --   EOSABS 0.0 0.0 0.1  --  0.0 0.0  --   --   BASOSABS 0.0 0.0 0.0  --  0.0 0.0  --   --     Chemistries  Recent Labs  Lab 04/15/19 0349 04/16/19 0307 04/17/19 0330 04/17/19 1038 04/18/19 0315 04/19/19 0125 04/20/19 0215 04/21/19 0605  NA 144 145 140 141 141 137 140 139  K 3.6 4.2 4.0 3.4* 4.3 4.3 3.8 4.0  CL 102 103 98  --  98 95* 95* 95*  CO2 30 31 29   --  32 31 32 33*  GLUCOSE 147* 238* 158*  --  166* 191* 133* 69*  BUN 123* 104* 111*  --  97* 96* 93* 84*  CREATININE 3.97* 4.19* 3.91*  --  3.75* 3.68* 3.60* 3.39*  CALCIUM 8.1* 8.3* 8.1*  --  8.1* 8.0* 8.0* 8.1*  MG 2.0 2.1 2.1  --  2.1 2.2  --   --   AST 51* 41 32  --  31 30 28  32  ALT 29 26 24   --  23 20 19 22   ALKPHOS 76 73 70  --  64 63 59 65  BILITOT 1.1 1.0 1.2  --  1.2 1.0 1.2 0.7   ------------------------------------------------------------------------------------------------------------------ No results for input(s): CHOL, HDL, LDLCALC, TRIG, CHOLHDL, LDLDIRECT in the last 72 hours.  Lab Results  Component Value Date   HGBA1C 7.9 (H) 04/10/2019   ------------------------------------------------------------------------------------------------------------------ No results for input(s): TSH, T4TOTAL, T3FREE, THYROIDAB in the last 72 hours.  Invalid input(s): FREET3 ------------------------------------------------------------------------------------------------------------------ No results for input(s): VITAMINB12, FOLATE, FERRITIN, TIBC, IRON,  RETICCTPCT in the last 72 hours.  Coagulation profile Recent Labs  Lab 04/17/19 0330 04/18/19 0315 04/19/19 0125 04/20/19 0215 04/21/19 0605  INR 3.3* 2.6* 2.2* 2.0* 2.4*    No results for input(s): DDIMER in the last 72 hours.  Cardiac Enzymes No results for input(s): CKMB, TROPONINI, MYOGLOBIN in the last 168 hours.  Invalid input(s): CK ------------------------------------------------------------------------------------------------------------------    Component Value Date/Time   BNP 34.6 04/20/2019 0215    Inpatient Medications  Scheduled Meds: . busPIRone  15 mg Oral BID  . Chlorhexidine Gluconate Cloth  6 each Topical Daily  . dextromethorphan-guaiFENesin  1 tablet Oral BID  . DULoxetine  60  mg Oral Daily  . fentaNYL  1 patch Transdermal Q72H  . furosemide  80 mg Oral BID  . insulin aspart  0-20 Units Subcutaneous TID WC  . insulin aspart  0-5 Units Subcutaneous QHS  . insulin aspart  6 Units Subcutaneous TID WC  . insulin glargine  50 Units Subcutaneous BID  . lamoTRIgine  200 mg Oral BID  . levETIRAcetam  1,000 mg Oral BID  . loratadine  10 mg Oral Daily  . pantoprazole  40 mg Oral Daily  . polyethylene glycol  17 g Oral BID  . potassium chloride  40 mEq Oral Daily  . pregabalin  50 mg Oral TID  . simvastatin  20 mg Oral QHS  . spironolactone  25 mg Oral Daily  . tamsulosin  0.4 mg Oral QPC supper  . warfarin  4 mg Oral ONCE-1800  . Warfarin - Pharmacist Dosing Inpatient   Does not apply q1800   Continuous Infusions: . sodium chloride 10 mL/hr at 04/13/19 1200   PRN Meds:.sodium chloride, acetaminophen, albuterol, food thickener, guaiFENesin-dextromethorphan, HYDROcodone-acetaminophen, [DISCONTINUED] ondansetron **OR** ondansetron (ZOFRAN) IV, polyethylene glycol, Resource ThickenUp Clear, sodium chloride  Micro Results No results found for this or any previous visit (from the past 240 hour(s)).  Radiology Reports Dg Chest Port 1 View  Result  Date: 04/19/2019 CLINICAL DATA:  Hypoxia. COVID-19 pneumonia. EXAM: PORTABLE CHEST 1 VIEW COMPARISON:  One-view chest x-ray 04/09/2019 FINDINGS: Peripheral airspace opacities are again seen bilaterally. Greatest densities in the right upper lobe. Overall aeration is improved. IMPRESSION: 1. Shifting appearance of extensive bilateral airspace disease compatible with pneumonia. 2. Overall aeration is improved with some residual disease in the right upper lobe. Electronically Signed   By: San Morelle M.D.   On: 04/19/2019 12:37   Dg Chest Port 1 View  Result Date: 04/09/2019 CLINICAL DATA:  Onset respiratory distress today. COVID-19 exposure. EXAM: PORTABLE CHEST 1 VIEW COMPARISON:  PA and lateral chest 01/26/2019.  CT chest 12/28/2018. FINDINGS: Lung volumes are low. There is patchy bilateral airspace disease. Heart size is normal. No pneumothorax or pleural effusion. IMPRESSION: Patchy bilateral airspace disease worrisome for pneumonia including atypical/viral infection. Electronically Signed   By: Inge Rise M.D.   On: 04/09/2019 13:51      Phillips Climes M.D on 04/21/2019 at 11:54 AM  Between 7am to 7pm - Pager - [phone]  After 7pm go to [email or web address] - password [facility]  [facility] -  Office  [phone]

## 2019-04-21 NOTE — Plan of Care (Signed)
Patient and wife updated on plan of care, all questions answered

## 2019-04-21 NOTE — Progress Notes (Signed)
Physical Therapy Treatment Patient Details Name: [name] MRN: [id number] DOB: 08-01-1965 Today's Date: 04/21/2019    History of Present Illness 53 year old super morbidly obese Caucasian male with history of seizures, DM type II, depression, BPH, OSA, OHS on 2 to 3 L nasal cannula oxygen at home.  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  He was admitted to ICU    PT Comments    Pt needs max and continued encouragement to participate in tx today. Pt states he is to d/c home with family but with multi and continued strong encouragement declined to attempt sit<>stand or transfer to be able to get from bed to w/c when he gets home. He insists he will be ok and family will be able to help when he needs. Was only agreeable to bed mob and there ex this pm stating he was having continual BMs sec to taking laxative. Worked on rolling L and R needing mod a x2, then completed there ex with red/orange theraband. Pt on 3L/min via HFNC sats remain in high 80s and 90s but at times monitor reads 82% but pt insists its and error as he is using his hands to complete exercises.     Follow Up Recommendations  Home health PT;Supervision/Assistance - 24 hour     Equipment Recommendations  None recommended by PT    Recommendations for Other Services       Precautions / Restrictions Precautions Precautions: Fall Restrictions Weight Bearing Restrictions: No    Mobility  Bed Mobility Overal bed mobility: Needs Assistance Bed Mobility: Rolling Rolling: Mod assist         General bed mobility comments: assists in rolling to complete clean up  Transfers                 General transfer comment: declined to attempt transfer even with use of lift  Ambulation/Gait             General Gait Details: pt is non ambulatory   Stairs              Wheelchair Mobility    Modified Rankin (Stroke Patients Only)       Balance                                            Cognition Arousal/Alertness: Awake/alert Behavior During Therapy: WFL for tasks assessed/performed Overall Cognitive Status: Within Functional Limits for tasks assessed                                 General Comments: Declined to attempt standing with therapists today, states he has taken laxitive and is having continued BMs. Theapist attempted to encourage to attempt standing or transfer in preparation for d/c home tomorrow but pt declined.      Exercises Total Joint Exercises Ankle Circles/Pumps: AROM;10 reps;Both Heel Slides: AROM;Both;10 reps Hip ABduction/ADduction: AROM;10 reps;Both Straight Leg Raises: AROM;Both;10 reps General Exercises - Upper Extremity Shoulder Flexion: AROM;Strengthening;Both;Theraband;10 reps Theraband Level (Shoulder Flexion): Level 2 (Red) Shoulder ABduction: AROM;Strengthening;10 reps;Both;Theraband Theraband Level (Shoulder Abduction): Level 2 (Red) Elbow Flexion: AROM;Strengthening;Both;10 reps Theraband Level (Elbow Extension): Level 2 (Red)    General Comments General comments (skin integrity, edema,  etc.): Pt is to d/c home with family tomorrow, therapists attempted to enocurage to attempt sit<>stand at EOB, or get in chair with lift and attempt pull to stand and pt declined stating he was not comfortable sec to taking laxitive. Pt reports he will have enough assistance at home to be able to manage once he gets there. as previous session max education and encouragment was used to get pt to participate in tx. Pt only agreeable to activity in bed.      Pertinent Vitals/Pain Pain Assessment: No/denies pain    Home Living                      Prior Function            PT Goals (current goals can now be found in the care plan section) Acute Rehab PT Goals Patient Stated  Goal: to go home Time For Goal Achievement: 04/28/19 Potential to Achieve Goals: Fair Progress towards PT goals: Progressing toward goals    Frequency    Min 3X/week      PT Plan Current plan remains appropriate    Co-evaluation              AM-PAC PT "6 Clicks" Mobility   Outcome Measure  Help needed turning from your back to your side while in a flat bed without using bedrails?: A Lot Help needed moving from lying on your back to sitting on the side of a flat bed without using bedrails?: A Lot Help needed moving to and from a bed to a chair (including a wheelchair)?: Total Help needed standing up from a chair using your arms (e.g., wheelchair or bedside chair)?: Total Help needed to walk in hospital room?: Total Help needed climbing 3-5 steps with a railing? : Total 6 Click Score: 8    End of Session Equipment Utilized During Treatment: Oxygen Activity Tolerance: Patient tolerated treatment well Patient left: in bed;with call bell/phone within reach   PT Visit Diagnosis: Muscle weakness (generalized) (M62.81)     Time: [id number] PT Time Calculation (min) (ACUTE ONLY): 38 min  Charges:  $Therapeutic Exercise: 23-37 mins $Therapeutic Activity: 8-22 mins                     Horald Chestnut, PT    Delford Field 04/21/2019, 4:37 PM

## 2019-04-22 DIAGNOSIS — D696 Thrombocytopenia, unspecified: Secondary | ICD-10-CM

## 2019-04-22 DIAGNOSIS — U071 COVID-19: Secondary | ICD-10-CM | POA: Diagnosis not present

## 2019-04-22 DIAGNOSIS — N179 Acute kidney failure, unspecified: Secondary | ICD-10-CM | POA: Diagnosis not present

## 2019-04-22 DIAGNOSIS — J9611 Chronic respiratory failure with hypoxia: Secondary | ICD-10-CM | POA: Diagnosis not present

## 2019-04-22 LAB — COMPREHENSIVE METABOLIC PANEL
ALT: 20 U/L (ref 0–44)
AST: 30 U/L (ref 15–41)
Albumin: 2.3 g/dL — ABNORMAL LOW (ref 3.5–5.0)
Alkaline Phosphatase: 61 U/L (ref 38–126)
Anion gap: 11 (ref 5–15)
BUN: 85 mg/dL — ABNORMAL HIGH (ref 6–20)
CO2: 32 mmol/L (ref 22–32)
Calcium: 7.8 mg/dL — ABNORMAL LOW (ref 8.9–10.3)
Chloride: 93 mmol/L — ABNORMAL LOW (ref 98–111)
Creatinine, Ser: 3.6 mg/dL — ABNORMAL HIGH (ref 0.61–1.24)
GFR calc Af Amer: 21 mL/min — ABNORMAL LOW (ref >60)
GFR calc non Af Amer: 18 mL/min — ABNORMAL LOW (ref >60)
Glucose, Bld: 156 mg/dL — ABNORMAL HIGH (ref 70–99)
Potassium: 4 mmol/L (ref 3.5–5.1)
Sodium: 136 mmol/L (ref 135–145)
Total Bilirubin: 1.1 mg/dL (ref 0.3–1.2)
Total Protein: 6.1 g/dL — ABNORMAL LOW (ref 6.5–8.1)

## 2019-04-22 LAB — CBC
HCT: 31.8 % — ABNORMAL LOW (ref 39.0–52.0)
Hemoglobin: 10 g/dL — ABNORMAL LOW (ref 13.0–17.0)
MCH: 30.1 pg (ref 26.0–34.0)
MCHC: 31.4 g/dL (ref 30.0–36.0)
MCV: 95.8 fL (ref 80.0–100.0)
Platelets: 51 10*3/uL — ABNORMAL LOW (ref 150–400)
RBC: 3.32 MIL/uL — ABNORMAL LOW (ref 4.22–5.81)
RDW: 13.4 % (ref 11.5–15.5)
WBC: 4.2 10*3/uL (ref 4.0–10.5)
nRBC: 0 % (ref 0.0–0.2)

## 2019-04-22 LAB — DIC (DISSEMINATED INTRAVASCULAR COAGULATION)PANEL
D-Dimer, Quant: <0.27 ug/mL-FEU (ref 0.00–0.50)
Fibrinogen: 539 mg/dL — ABNORMAL HIGH (ref 210–475)
INR: 2.6 — ABNORMAL HIGH (ref 0.8–1.2)
Platelets: 57 10*3/uL — ABNORMAL LOW (ref 150–400)
Prothrombin Time: 27.2 seconds — ABNORMAL HIGH (ref 11.4–15.2)
Smear Review: NONE SEEN
aPTT: 45 seconds — ABNORMAL HIGH (ref 24–36)

## 2019-04-22 LAB — GLUCOSE, CAPILLARY
Glucose-Capillary: 147 mg/dL — ABNORMAL HIGH (ref 70–99)
Glucose-Capillary: 177 mg/dL — ABNORMAL HIGH (ref 70–99)
Glucose-Capillary: 332 mg/dL — ABNORMAL HIGH (ref 70–99)
Glucose-Capillary: 275 mg/dL — ABNORMAL HIGH (ref 70–99)

## 2019-04-22 LAB — PROTIME-INR
INR: 2.5 — ABNORMAL HIGH (ref 0.8–1.2)
Prothrombin Time: 26.7 seconds — ABNORMAL HIGH (ref 11.4–15.2)

## 2019-04-22 MED ORDER — WARFARIN SODIUM 5 MG PO TABS
5.0000 mg | ORAL_TABLET | Freq: Once | ORAL | Status: AC
  Administered 2019-04-22: 5 mg via ORAL
  Filled 2019-04-22: qty 1

## 2019-04-22 MED ORDER — PANTOPRAZOLE SODIUM 40 MG PO TBEC
40.0000 mg | DELAYED_RELEASE_TABLET | Freq: Two times a day (BID) | ORAL | Status: DC
  Administered 2019-04-22 – 2019-04-25 (×7): 40 mg via ORAL
  Filled 2019-04-22 (×6): qty 1

## 2019-04-22 MED ORDER — METHYLPREDNISOLONE SODIUM SUCC 125 MG IJ SOLR
80.0000 mg | Freq: Four times a day (QID) | INTRAMUSCULAR | Status: DC
  Administered 2019-04-22 – 2019-04-23 (×3): 80 mg via INTRAVENOUS
  Filled 2019-04-22 (×4): qty 2

## 2019-04-22 NOTE — Plan of Care (Signed)
Patient update don plan of care, patient said he already updated his family

## 2019-04-22 NOTE — Progress Notes (Signed)
Per patient, he has updated his wife today and I did not need to call her with an update tonight.  Earleen Reaper RN

## 2019-04-22 NOTE — TOC Transition Note (Signed)
Transition of Care [facility]) - CM/SW Discharge Note   Patient Details  Name: [name] MRN: [id number] Date of Birth: Apr 20, 1966  [facility] CM/SW Contact:  Eileen Stanford, LCSW Phone Number: 04/22/2019, 9:54 AM   Clinical Narrative:   Pt has been set up with [facility]. Pt will get PT, OT, and SLP. NO other needs at this time. Pt is cleared from CSW standpoint.    Final next level of care: [place] Barriers to Discharge: No Barriers Identified   Patient Goals and CMS Choice        Discharge Placement                       Discharge Plan and Services   Discharge Planning Services: CM Consult Post Acute Care Choice: Home Health                    HH Arranged: PT, OT, Speech Therapy [place]: [place] Date [place]: 04/22/19 Time [place]: [phone] Representative spoke with at [place]: [place] Determinants of Health ([place]) Interventions     Readmission Risk Interventions No flowsheet data found.

## 2019-04-22 NOTE — Progress Notes (Signed)
ANTICOAGULATION CONSULT NOTE - Follow Up Consult  Pharmacy Consult for warfarin Indication: history of pulmonary embolus and DVT  Allergies  Allergen Reactions  . Bee Venom Anaphylaxis, Shortness Of Breath and Swelling  . Other Shortness Of Breath    Itching, rash with IVP DYE, iodine, shellfish LATEX  . Penicillins Anaphylaxis and Shortness Of Breath    Has patient had a PCN reaction causing immediate rash, facial/tongue/throat swelling, SOB or lightheadedness with hypotension: Yes Has patient had a PCN reaction causing severe rash involving mucus membranes or skin necrosis: No Has patient had a PCN reaction that required hospitalization No Has patient had a PCN reaction occurring within the last 10 years: No If all of the above answers are "NO", then may proceed with Cephalosporin use.   . Shellfish Allergy Nausea And Vomiting and Other (See Comments)    Feels like insides are twisting  . Iodinated Diagnostic Agents     Other reaction(s): RASH  . Iohexol      Code: RASH, Desc: PT WAS ON PREDNISONE FOR GOUT TX. @ TIME OF SCAN AND RECEIVED 50 MG OF BENADRYL IV-ARS 10/08/07   . Iodine Rash  . Latex Rash    Patient Measurements: Height: 6\' 3"  (190.5 cm) Weight: (!) 462 lb (209.6 kg) IBW/kg (Calculated) : 84.5   Vital Signs: Temp: 97.6 F (36.4 C) (10/24 0722) Temp Source: Axillary (10/24 0722) BP: 120/71 (10/24 0722) Pulse Rate: 76 (10/24 0722)  Labs: Recent Labs    04/20/19 0215 04/21/19 0605 04/22/19 0651  HGB 10.2* 10.4* 10.0*  HCT 32.5* 33.8* 31.8*  PLT 58* 59* 51*  LABPROT 22.3* 25.8* 26.7*  INR 2.0* 2.4* 2.5*  CREATININE 3.60* 3.39* 3.60*    Estimated Creatinine Clearance: 45.7 mL/min (A) (by C-G formula based on SCr of 3.6 mg/dL (H)).  Medications:  Scheduled:  . busPIRone  15 mg Oral BID  . Chlorhexidine Gluconate Cloth  6 each Topical Daily  . dextromethorphan-guaiFENesin  1 tablet Oral BID  . DULoxetine  60 mg Oral Daily  . fentaNYL  1 patch  Transdermal Q72H  . furosemide  80 mg Oral BID  . insulin aspart  0-20 Units Subcutaneous TID WC  . insulin aspart  0-5 Units Subcutaneous QHS  . insulin aspart  6 Units Subcutaneous TID WC  . insulin glargine  50 Units Subcutaneous BID  . lamoTRIgine  200 mg Oral BID  . levETIRAcetam  1,000 mg Oral BID  . loratadine  10 mg Oral Daily  . pantoprazole  40 mg Oral Daily  . polyethylene glycol  17 g Oral BID  . potassium chloride  40 mEq Oral Daily  . pregabalin  50 mg Oral TID  . simvastatin  20 mg Oral QHS  . spironolactone  25 mg Oral Daily  . tamsulosin  0.4 mg Oral QPC supper  . Warfarin - Pharmacist Dosing Inpatient   Does not apply q1800    Assessment: 53 yo male admitted on 04/09/2019 for SOB and found to be positive for COVID-19. Pharmacy is consulted to dose warfarin for history of DVT and PE. Patient is on warfarin prior to admission. On admission, INR was supratherapeutic and warfarin was been held for three days (10/11-10/13). Warfarin was resumed on 10/14 when INR was therapeutic.   INR 2.5 remains therapeutic.  Poor intake appears to be improving (15-20% of meals).  H/H remains low/stable.  Platelets down to 51k (baseline ~100-120k).  No bleeding or complications reported. Prior to Admission Warfarin Regimen: warfarin 5mg   daily  Goal of Therapy:  INR 2-3 Monitor platelets by anticoagulation protocol: Yes   Plan:  Warfarin 5mg  PO today at 18:00 Monitor daily INR, CBC, and S/S of bleeding   Gretta Arab PharmD, BCPS Clinical pharmacist phone 7am- 5pm: [phone] 04/22/2019 8:29 AM

## 2019-04-22 NOTE — Progress Notes (Addendum)
PROGRESS NOTE                                                                                                                                                                                                             Patient Demographics:    [name], is a 53 y.o. male, DOB - 1965/10/10, [id number]  Admit date - 04/09/2019   Admitting Physician Ejiroghene Arlyce Dice, MD  Outpatient Primary MD for the patient is Sharion Balloon, FNP  LOS - 64   Chief Complaint  Patient presents with  . Respiratory Distress       Brief Narrative    53 year old super morbidly obese Caucasian male with history of seizures, DM type II, depression, BPH, OSA, OHS on 2 to 3 L nasal cannula oxygen at home.  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  He was admitted to ICU stabilized and transferred to hospitalist service on 04/14/2019.   Subjective:    [name] today denies any headache, nausea, vomiting, reports cough and dyspnea significantly improved.   Assessment  & Plan :    Active Problems:   OSA (obstructive sleep apnea)   Obesity hypoventilation syndrome (HCC)   Pneumonia due to COVID-19 virus   Pressure injury of skin   Chronic respiratory failure with hypoxia (HCC)   History of pulmonary embolus (PE)   Morbid obesity with BMI of 60.0-69.9, adult (HCC)   Seizures (HCC)   Diabetes mellitus type 2, uncontrolled, with complications (HCC)   Acute renal failure superimposed on stage 3b chronic kidney disease ([facility])  Acute on chronic hypoxic Resp. Failure due to Acute Covid 19 Viral Pneumonitis during the ongoing 2020 Covid 19 Pandemic  -Patient with improvement of oxygen requirement, at one point was 15 L nasal cannula before 2 days, this morning he is on 4 L nasal cannula, at baseline he is on 2 to 4 L oxygen . -I  have encouraged patient again today about incentive spirometry, flutter valve use, and remain out of bed to chair, proning will be difficult given his morbid obesity. -Treated with Decadron -  Also is developing atelectasis from his bedbound status and not moving out of bed compounded by his super morbid obesity.  Encouraged  to sit up in chair in the daytime and use I-S and flutter valve for pulmonary toiletry.  Continue monitoring clinically. -Repeat chest x-ray 10/21 showing some mild improvement of his COVID-19 for pneumonia  COVID-19 Labs  Recent Labs    04/20/19 0215  CRP 0.8    Lab Results  Component Value Date   SARSCOV2NAA POSITIVE (A) 04/09/2019   OSA/OHS  - currently not on CPAP at home.  Thrombocytopenia -Appears to be chronic, baseline platelet count 90-100K, did follow with oncology Dr. Delton Coombes in the past, this was thought to be secondary to chronic splenomegaly, with negative work-up as an outpatient . -But this has been significantly worsening during hospital stay today level is 51K, will obtain DIC panel, continue to monitor closely for any signs of bleeding especially he is on warfarin with target INR 2-3 . -We will do short course of high-dose trial of Solu-Medrol 80 mg IV every 6 hours.  History of PE and DVT.  - On Coumadin INR therapeutic.  Pharmacy monitoring.  Hx seizure  - Stable, continue combination of Keppra and Lamictal.   Dyslipidemia.   - On Zocor.  Morbid obesity BMI of 57.   - Outpatient follow-up PCP for weight loss.  Deconditioning.   - PT OT consulted.  Sit to sit up in chair and daytime.  Depression and bipolar disorder.  - Continue buspirone, Cymbalta and Lyrica combination.  AKI on CKD 4.   - Baseline creatinine 3.5.  Initially had AKI due to ATN now improving with ongoing diuresis with oral Lasix 80 mg twice daily.  Monitor renal function closely with ongoing diuresis.  DM type II.   - On Lantus and sliding scale  combination dose adjusted 10/18.  Outpatient glycemic control is slightly poor due to hyperglycemia.  A1c was 7.9, lowered Lantus to 50 units subcu twice daily given morning CBG is 74.  Urinary retention -Attempted to DC Foley catheter yesterday, unfortunately he had recurrent urinary retention, Foley catheter was reinserted overnight, start on Flomax..   Code Status :Full  Family Communication  : Discussed with patient,  Disposition Plan  : Home with Home health   Consults  : None  Procedures  : None  DVT Prophylaxis  :  On warfarin  Lab Results  Component Value Date   PLT 51 (L) 04/22/2019    Antibiotics  :    Anti-infectives (From admission, onward)   Start     Dose/Rate Route Frequency Ordered Stop   04/11/19 1000  remdesivir 100 mg in sodium chloride 0.9 % 250 mL IVPB     100 mg 500 mL/hr over 30 Minutes Intravenous Every 24 hours 04/10/19 0000 04/14/19 1035   04/10/19 0100  remdesivir 200 mg in sodium chloride 0.9 % 250 mL IVPB     200 mg 500 mL/hr over 30 Minutes Intravenous Once 04/10/19 0000 04/10/19 0138        Objective:   Vitals:   04/22/19 0137 04/22/19 0423 04/22/19 0722 04/22/19 1118  BP: (!) 132/43 120/68 120/71 119/72  Pulse: 74 76 76 79  Resp:   19 18  Temp: 97.7 F (36.5 C) 97.7 F (36.5 C) 97.6 F (36.4 C) 98.1 F (36.7 C)  TempSrc: Oral Oral Axillary Oral  SpO2: 96% 91% 97% 95%  Weight:      Height:        Wt Readings from Last 3 Encounters:  04/10/19 (!) 209.6 kg  02/08/19 (!) 205.2 kg  11/03/17 (!) 205.2 kg  Intake/Output Summary (Last 24 hours) at 04/22/2019 1337 Last data filed at 04/22/2019 1005 Gross per 24 hour  Intake 300 ml  Output 2345 ml  Net -2045 ml     Physical Exam  Awake Alert, Oriented X 3, No new F.N deficits, Normal affect Symmetrical Chest wall movement, Good air movement bilaterally, CTAB RRR,No Gallops,Rubs or new Murmurs, No Parasternal Heave +ve B.Sounds, Abd Soft, No tenderness, No rebound -  guarding or rigidity. No Cyanosis, Clubbing , +1  edema, chronic lower skin discoloration secondary to venous stasis    Data Review:    CBC Recent Labs  Lab 04/16/19 0307 04/17/19 0330  04/18/19 0315 04/19/19 0125 04/20/19 0215 04/21/19 0605 04/22/19 0651  WBC 2.5* 3.0*  --  2.9* 2.5* 3.6* 4.2 4.2  HGB 10.8* 11.0*   < > 10.8* 10.3* 10.2* 10.4* 10.0*  HCT 34.3* 35.0*   < > 34.3* 33.0* 32.5* 33.8* 31.8*  PLT 81* 82*  --  75* 64* 58* 59* 51*  MCV 94.2 94.9  --  95.3 95.4 96.2 95.8 95.8  MCH 29.7 29.8  --  30.0 29.8 30.2 29.5 30.1  MCHC 31.5 31.4  --  31.5 31.2 31.4 30.8 31.4  RDW 14.4 14.2  --  13.9 13.8 13.5 13.4 13.4  LYMPHSABS 0.4* 0.6*  --  0.5* 0.4*  --   --   --   MONOABS 0.2 0.3  --  0.3 0.2  --   --   --   EOSABS 0.0 0.1  --  0.0 0.0  --   --   --   BASOSABS 0.0 0.0  --  0.0 0.0  --   --   --    < > = values in this interval not displayed.    Chemistries  Recent Labs  Lab 04/16/19 0307 04/17/19 0330  04/18/19 0315 04/19/19 0125 04/20/19 0215 04/21/19 0605 04/22/19 0651  NA 145 140   < > 141 137 140 139 136  K 4.2 4.0   < > 4.3 4.3 3.8 4.0 4.0  CL 103 98  --  98 95* 95* 95* 93*  CO2 31 29  --  32 31 32 33* 32  GLUCOSE 238* 158*  --  166* 191* 133* 69* 156*  BUN 104* 111*  --  97* 96* 93* 84* 85*  CREATININE 4.19* 3.91*  --  3.75* 3.68* 3.60* 3.39* 3.60*  CALCIUM 8.3* 8.1*  --  8.1* 8.0* 8.0* 8.1* 7.8*  MG 2.1 2.1  --  2.1 2.2  --   --   --   AST 41 32  --  31 30 28  32 30  ALT 26 24  --  23 20 19 22 20   ALKPHOS 73 70  --  64 63 59 65 61  BILITOT 1.0 1.2  --  1.2 1.0 1.2 0.7 1.1   < > = values in this interval not displayed.   ------------------------------------------------------------------------------------------------------------------ No results for input(s): CHOL, HDL, LDLCALC, TRIG, CHOLHDL, LDLDIRECT in the last 72 hours.  Lab Results  Component Value Date   HGBA1C 7.9 (H) 04/10/2019    ------------------------------------------------------------------------------------------------------------------ No results for input(s): TSH, T4TOTAL, T3FREE, THYROIDAB in the last 72 hours.  Invalid input(s): FREET3 ------------------------------------------------------------------------------------------------------------------ No results for input(s): VITAMINB12, FOLATE, FERRITIN, TIBC, IRON, RETICCTPCT in the last 72 hours.  Coagulation profile Recent Labs  Lab 04/18/19 0315 04/19/19 0125 04/20/19 0215 04/21/19 0605 04/22/19 0651  INR 2.6* 2.2* 2.0* 2.4* 2.5*    No results for input(s):  DDIMER in the last 72 hours.  Cardiac Enzymes No results for input(s): CKMB, TROPONINI, MYOGLOBIN in the last 168 hours.  Invalid input(s): CK ------------------------------------------------------------------------------------------------------------------    Component Value Date/Time   BNP 34.6 04/20/2019 0215    Inpatient Medications  Scheduled Meds: . busPIRone  15 mg Oral BID  . Chlorhexidine Gluconate Cloth  6 each Topical Daily  . dextromethorphan-guaiFENesin  1 tablet Oral BID  . DULoxetine  60 mg Oral Daily  . fentaNYL  1 patch Transdermal Q72H  . furosemide  80 mg Oral BID  . insulin aspart  0-20 Units Subcutaneous TID WC  . insulin aspart  0-5 Units Subcutaneous QHS  . insulin aspart  6 Units Subcutaneous TID WC  . insulin glargine  50 Units Subcutaneous BID  . lamoTRIgine  200 mg Oral BID  . levETIRAcetam  1,000 mg Oral BID  . loratadine  10 mg Oral Daily  . pantoprazole  40 mg Oral Daily  . potassium chloride  40 mEq Oral Daily  . pregabalin  50 mg Oral TID  . simvastatin  20 mg Oral QHS  . spironolactone  25 mg Oral Daily  . tamsulosin  0.4 mg Oral QPC supper  . warfarin  5 mg Oral ONCE-1800  . Warfarin - Pharmacist Dosing Inpatient   Does not apply q1800   Continuous Infusions: . sodium chloride 10 mL/hr at 04/13/19 1200   PRN Meds:.sodium chloride,  acetaminophen, albuterol, food thickener, guaiFENesin-dextromethorphan, HYDROcodone-acetaminophen, [DISCONTINUED] ondansetron **OR** ondansetron (ZOFRAN) IV, polyethylene glycol, Resource ThickenUp Clear, sodium chloride  Micro Results No results found for this or any previous visit (from the past 240 hour(s)).  Radiology Reports Dg Chest Port 1 View  Result Date: 04/19/2019 CLINICAL DATA:  Hypoxia. COVID-19 pneumonia. EXAM: PORTABLE CHEST 1 VIEW COMPARISON:  One-view chest x-ray 04/09/2019 FINDINGS: Peripheral airspace opacities are again seen bilaterally. Greatest densities in the right upper lobe. Overall aeration is improved. IMPRESSION: 1. Shifting appearance of extensive bilateral airspace disease compatible with pneumonia. 2. Overall aeration is improved with some residual disease in the right upper lobe. Electronically Signed   By: San Morelle M.D.   On: 04/19/2019 12:37   Dg Chest Port 1 View  Result Date: 04/09/2019 CLINICAL DATA:  Onset respiratory distress today. COVID-19 exposure. EXAM: PORTABLE CHEST 1 VIEW COMPARISON:  PA and lateral chest 01/26/2019.  CT chest 12/28/2018. FINDINGS: Lung volumes are low. There is patchy bilateral airspace disease. Heart size is normal. No pneumothorax or pleural effusion. IMPRESSION: Patchy bilateral airspace disease worrisome for pneumonia including atypical/viral infection. Electronically Signed   By: Inge Rise M.D.   On: 04/09/2019 13:51      Phillips Climes M.D on 04/22/2019 at 1:37 PM  Between 7am to 7pm - Pager - [phone]  After 7pm go to [email or web address] - password [facility]  [facility] -  Office  [phone]

## 2019-04-23 DIAGNOSIS — J9611 Chronic respiratory failure with hypoxia: Secondary | ICD-10-CM | POA: Diagnosis not present

## 2019-04-23 DIAGNOSIS — N1832 Chronic kidney disease, stage 3b: Secondary | ICD-10-CM | POA: Diagnosis not present

## 2019-04-23 DIAGNOSIS — Z86711 Personal history of pulmonary embolism: Secondary | ICD-10-CM | POA: Diagnosis not present

## 2019-04-23 DIAGNOSIS — N179 Acute kidney failure, unspecified: Secondary | ICD-10-CM | POA: Diagnosis not present

## 2019-04-23 LAB — COMPREHENSIVE METABOLIC PANEL
ALT: 22 U/L (ref 0–44)
AST: 25 U/L (ref 15–41)
Albumin: 2.4 g/dL — ABNORMAL LOW (ref 3.5–5.0)
Alkaline Phosphatase: 69 U/L (ref 38–126)
Anion gap: 9 (ref 5–15)
BUN: 90 mg/dL — ABNORMAL HIGH (ref 6–20)
CO2: 30 mmol/L (ref 22–32)
Calcium: 8.2 mg/dL — ABNORMAL LOW (ref 8.9–10.3)
Chloride: 93 mmol/L — ABNORMAL LOW (ref 98–111)
Creatinine, Ser: 3.8 mg/dL — ABNORMAL HIGH (ref 0.61–1.24)
GFR calc Af Amer: 20 mL/min — ABNORMAL LOW (ref >60)
GFR calc non Af Amer: 17 mL/min — ABNORMAL LOW (ref >60)
Glucose, Bld: 403 mg/dL — ABNORMAL HIGH (ref 70–99)
Potassium: 5.6 mmol/L — ABNORMAL HIGH (ref 3.5–5.1)
Sodium: 132 mmol/L — ABNORMAL LOW (ref 135–145)
Total Bilirubin: 0.9 mg/dL (ref 0.3–1.2)
Total Protein: 6.5 g/dL (ref 6.5–8.1)

## 2019-04-23 LAB — BASIC METABOLIC PANEL
Anion gap: 13 (ref 5–15)
BUN: 89 mg/dL — ABNORMAL HIGH (ref 6–20)
CO2: 29 mmol/L (ref 22–32)
Calcium: 8.1 mg/dL — ABNORMAL LOW (ref 8.9–10.3)
Chloride: 92 mmol/L — ABNORMAL LOW (ref 98–111)
Creatinine, Ser: 4.07 mg/dL — ABNORMAL HIGH (ref 0.61–1.24)
GFR calc Af Amer: 18 mL/min — ABNORMAL LOW (ref >60)
GFR calc non Af Amer: 16 mL/min — ABNORMAL LOW (ref >60)
Glucose, Bld: 348 mg/dL — ABNORMAL HIGH (ref 70–99)
Potassium: 5.4 mmol/L — ABNORMAL HIGH (ref 3.5–5.1)
Sodium: 134 mmol/L — ABNORMAL LOW (ref 135–145)

## 2019-04-23 LAB — CBC
HCT: 31.1 % — ABNORMAL LOW (ref 39.0–52.0)
Hemoglobin: 9.7 g/dL — ABNORMAL LOW (ref 13.0–17.0)
MCH: 29.8 pg (ref 26.0–34.0)
MCHC: 31.2 g/dL (ref 30.0–36.0)
MCV: 95.4 fL (ref 80.0–100.0)
Platelets: 46 10*3/uL — ABNORMAL LOW (ref 150–400)
RBC: 3.26 MIL/uL — ABNORMAL LOW (ref 4.22–5.81)
RDW: 13.1 % (ref 11.5–15.5)
WBC: 2.2 10*3/uL — ABNORMAL LOW (ref 4.0–10.5)
nRBC: 0 % (ref 0.0–0.2)

## 2019-04-23 LAB — GLUCOSE, CAPILLARY
Glucose-Capillary: 415 mg/dL — ABNORMAL HIGH (ref 70–99)
Glucose-Capillary: 469 mg/dL — ABNORMAL HIGH (ref 70–99)
Glucose-Capillary: 337 mg/dL — ABNORMAL HIGH (ref 70–99)
Glucose-Capillary: 281 mg/dL — ABNORMAL HIGH (ref 70–99)

## 2019-04-23 LAB — PROTIME-INR
INR: 2.8 — ABNORMAL HIGH (ref 0.8–1.2)
Prothrombin Time: 28.8 seconds — ABNORMAL HIGH (ref 11.4–15.2)

## 2019-04-23 MED ORDER — WARFARIN SODIUM 1 MG PO TABS
1.0000 mg | ORAL_TABLET | Freq: Once | ORAL | Status: AC
  Administered 2019-04-23: 1 mg via ORAL
  Filled 2019-04-23: qty 1

## 2019-04-23 MED ORDER — SODIUM POLYSTYRENE SULFONATE 15 GM/60ML PO SUSP
45.0000 g | Freq: Once | ORAL | Status: AC
  Administered 2019-04-23: 08:00:00 45 g via ORAL
  Filled 2019-04-23: qty 180

## 2019-04-23 MED ORDER — IMMUNE GLOBULIN (HUMAN) 10 GM/100ML IV SOLN
125.0000 g | Freq: Once | INTRAVENOUS | Status: AC
  Administered 2019-04-23: 18:00:00 125 g via INTRAVENOUS
  Filled 2019-04-23: qty 1200

## 2019-04-23 MED ORDER — FUROSEMIDE 40 MG PO TABS
80.0000 mg | ORAL_TABLET | Freq: Two times a day (BID) | ORAL | Status: DC
  Administered 2019-04-24 – 2019-04-25 (×4): 80 mg via ORAL
  Filled 2019-04-23: qty 4
  Filled 2019-04-23 (×4): qty 2
  Filled 2019-04-23: qty 4
  Filled 2019-04-23: qty 2

## 2019-04-23 MED ORDER — IMMUNE GLOBULIN (HUMAN) 10 GM/100ML IV SOLN
1.0000 g/kg | Freq: Once | INTRAVENOUS | Status: DC
  Filled 2019-04-23: qty 1300

## 2019-04-23 MED ORDER — INSULIN GLARGINE 100 UNIT/ML ~~LOC~~ SOLN
60.0000 [IU] | Freq: Two times a day (BID) | SUBCUTANEOUS | Status: DC
  Administered 2019-04-23 – 2019-04-25 (×4): 60 [IU] via SUBCUTANEOUS
  Filled 2019-04-23 (×5): qty 0.6

## 2019-04-23 MED ORDER — INSULIN ASPART 100 UNIT/ML ~~LOC~~ SOLN
14.0000 [IU] | Freq: Once | SUBCUTANEOUS | Status: AC
  Administered 2019-04-23: 14 [IU] via SUBCUTANEOUS

## 2019-04-23 NOTE — Progress Notes (Signed)
Attempted to convince patient to let staff give him a bath.  Explained importance of the bath.  He continues to refuse.  Also, refusing to turn.  He states he is adjusting himself in the bed.  He had a nose bleed this morning.  At least 7-8 tissues soaked in blood.  The bleeding has stopped.  He stated he was using his nasal spray and then blew his nose.  Advise him not to blow nose since his platelets continue to be low.  He voiced understanding.  His oxygen levels were dropping as low mid 80s.  He states he is not short of breath and that he does this at home.  He does not want oxygen increased past 4 liters.  He is afraid that he will not be able to go home if something else goes wrong.  We talked about his fears.  Will continue to monitor patient.  Earleen Reaper RN

## 2019-04-23 NOTE — Progress Notes (Signed)
PROGRESS NOTE                                                                                                                                                                                                             Patient Demographics:    [name], is a 53 y.o. male, DOB - 1965/12/24, [id number]  Admit date - 04/09/2019   Admitting Physician Ejiroghene Arlyce Dice, MD  Outpatient Primary MD for the patient is Sharion Balloon, FNP  LOS - 65   Chief Complaint  Patient presents with  . Respiratory Distress       Brief Narrative    53 year old super morbidly obese Caucasian male with history of seizures, DM type II, depression, BPH, OSA, OHS on 2 to 3 L nasal cannula oxygen at home.  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  He was admitted to ICU stabilized and transferred to hospitalist service on 04/14/2019.   Subjective:    [name] today reports some minimal nosebleed overnight, last bowel movement 10/23, report dyspnea at baseline, and has increased oxygen requirement this morning .   Assessment  & Plan :    Active Problems:   OSA (obstructive sleep apnea)   Obesity hypoventilation syndrome (HCC)   Pneumonia due to COVID-19 virus   Pressure injury of skin   Chronic respiratory failure with hypoxia (HCC)   History of pulmonary embolus (PE)   Morbid obesity with BMI of 60.0-69.9, adult (HCC)   Seizures (HCC)   Diabetes mellitus type 2, uncontrolled, with complications (HCC)   Acute renal failure superimposed on stage 3b chronic kidney disease ([place])  Acute on chronic hypoxic Resp. Failure due to Acute Covid 19 Viral Pneumonitis during the ongoing 2020 Covid 19 Pandemic  -Patient with improvement of oxygen requirement, at one point was 15 L nasal cannula, had improvement of oxygen requirement,  used to be on 4 L nasal cannula, had increased to 10 L overnight, patient was not using incentive spirometry, refused to get out of bed to chair yesterday . -Treated with Decadron -  Also is developing atelectasis from his bedbound status and not moving out of bed compounded by his super morbid obesity.  Encouraged to sit up in chair  in the daytime and use I-S and flutter valve for pulmonary toiletry.  Continue monitoring clinically. -Repeat chest x-ray 10/21 showing some mild improvement of his COVID-19 for pneumonia  COVID-19 Labs  Recent Labs    04/22/19 1437  DDIMER <0.27    Lab Results  Component Value Date   SARSCOV2NAA POSITIVE (A) 04/09/2019   OSA/OHS  - currently not on CPAP at home.  Thrombocytopenia -Appears to be chronic, baseline platelet count 90-100K, did follow with oncology Dr. Delton Coombes in the past, this was thought to be secondary to chronic splenomegaly, with negative work-up as an outpatient . -Patient with worsening thrombocytopenia, discussed with hematology Dr. Jana Hakim 10/24, rule out TTP with DIC panel, no evidence of schistocytes, so no evidence of TTP, titp, but platelet counts continue to trend down, it is 40 6K today, will give 1 dose of IVIG empirically . - short course of high-dose trial of Solu-Medrol 80 mg IV every 6 hours.  History of PE and DVT.  - On Coumadin INR therapeutic.  Pharmacy monitoring. -By reviewing history, patient with multiple history of PE/DVTs in the past  Hyperkalemia -We will stop Aldactone, potassium supplement, will give Kayexalate, recheck level this afternoon.  Hx seizure  - Stable, continue combination of Keppra and Lamictal.   Dyslipidemia.   - On Zocor.  Morbid obesity BMI of 57.   - Outpatient follow-up PCP for weight loss.  Deconditioning.   - PT OT consulted.  Sit to sit up in chair and daytime.  Depression and bipolar disorder.  - Continue buspirone, Cymbalta and Lyrica combination.  AKI on CKD 4.    - Baseline creatinine 3.5.  Initially had AKI due to ATN now improving with ongoing diuresis with oral Lasix 80 mg twice daily.    Will hold evening dose of Lasix given worsening creatinine at 3.8.  DM type II.   - On Lantus and sliding scale combination dose adjusted 10/18.  Outpatient glycemic control is slightly poor due to hyperglycemia.  A1c was 7.9, lowered Lantus to 50 units subcu twice daily given morning CBG is 74.  Urinary retention -Patient failed voiding trial 10/23, Foley catheter reinserted 10/24. -started on  Flomax   Code Status :Full  Family Communication  : Discussed with patient,  Disposition Plan  : Home with Home health   Consults  : None  Procedures  : None  DVT Prophylaxis  :  On warfarin  Lab Results  Component Value Date   PLT 46 (L) 04/23/2019    Antibiotics  :    Anti-infectives (From admission, onward)   Start     Dose/Rate Route Frequency Ordered Stop   04/11/19 1000  remdesivir 100 mg in sodium chloride 0.9 % 250 mL IVPB     100 mg 500 mL/hr over 30 Minutes Intravenous Every 24 hours 04/10/19 0000 04/14/19 1035   04/10/19 0100  remdesivir 200 mg in sodium chloride 0.9 % 250 mL IVPB     200 mg 500 mL/hr over 30 Minutes Intravenous Once 04/10/19 0000 04/10/19 0138        Objective:   Vitals:   04/23/19 0520 04/23/19 0600 04/23/19 1100 04/23/19 1212  BP: 135/69   121/69  Pulse: 78 78  86  Resp: 20   18  Temp: 98.7 F (37.1 C)   98.3 F (36.8 C)  TempSrc: Oral   Oral  SpO2: (!) 88% (!) 88%  (!) 87%  Weight:   (!) 189.1 kg   Height:  Wt Readings from Last 3 Encounters:  04/23/19 (!) 189.1 kg  02/08/19 (!) 205.2 kg  11/03/17 (!) 205.2 kg     Intake/Output Summary (Last 24 hours) at 04/23/2019 1451 Last data filed at 04/23/2019 0600 Gross per 24 hour  Intake 960 ml  Output 1875 ml  Net -915 ml     Physical Exam  Awake Alert, Oriented X 3, No new F.N deficits, Normal affect Symmetrical Chest wall movement, Good  air movement bilaterally, CTAB RRR,No Gallops,Rubs or new Murmurs, No Parasternal Heave +ve B.Sounds, Abd Soft, No tenderness, No rebound - guarding or rigidity. No Cyanosis, Clubbing ,+1edema, No new Rash or bruise       Data Review:    CBC Recent Labs  Lab 04/17/19 0330  04/18/19 0315 04/19/19 0125 04/20/19 0215 04/21/19 [id number] 04/22/19 0651 04/22/19 1437 04/23/19 0445  WBC 3.0*  --  2.9* 2.5* 3.6* 4.2 4.2  --  2.2*  HGB 11.0*   < > 10.8* 10.3* 10.2* 10.4* 10.0*  --  9.7*  HCT 35.0*   < > 34.3* 33.0* 32.5* 33.8* 31.8*  --  31.1*  PLT 82*  --  75* 64* 58* 59* 51* 57* 46*  MCV 94.9  --  95.3 95.4 96.2 95.8 95.8  --  95.4  MCH 29.8  --  30.0 29.8 30.2 29.5 30.1  --  29.8  MCHC 31.4  --  31.5 31.2 31.4 30.8 31.4  --  31.2  RDW 14.2  --  13.9 13.8 13.5 13.4 13.4  --  13.1  LYMPHSABS 0.6*  --  0.5* 0.4*  --   --   --   --   --   MONOABS 0.3  --  0.3 0.2  --   --   --   --   --   EOSABS 0.1  --  0.0 0.0  --   --   --   --   --   BASOSABS 0.0  --  0.0 0.0  --   --   --   --   --    < > = values in this interval not displayed.    Chemistries  Recent Labs  Lab 04/17/19 0330  04/18/19 0315 04/19/19 0125 04/20/19 0215 04/21/19 0605 04/22/19 0651 04/23/19 0445  NA 140   < > 141 137 140 139 136 132*  K 4.0   < > 4.3 4.3 3.8 4.0 4.0 5.6*  CL 98  --  98 95* 95* 95* 93* 93*  CO2 29  --  32 31 32 33* 32 30  GLUCOSE 158*  --  166* 191* 133* 69* 156* 403*  BUN 111*  --  97* 96* 93* 84* 85* 90*  CREATININE 3.91*  --  3.75* 3.68* 3.60* 3.39* 3.60* 3.80*  CALCIUM 8.1*  --  8.1* 8.0* 8.0* 8.1* 7.8* 8.2*  MG 2.1  --  2.1 2.2  --   --   --   --   AST 32  --  31 30 28  32 30 25  ALT 24  --  23 20 19 22 20 22   ALKPHOS 70  --  64 63 59 65 61 69  BILITOT 1.2  --  1.2 1.0 1.2 0.7 1.1 0.9   < > = values in this interval not displayed.   ------------------------------------------------------------------------------------------------------------------ No results for input(s): CHOL, HDL,  LDLCALC, TRIG, CHOLHDL, LDLDIRECT in the last 72 hours.  Lab Results  Component Value Date   HGBA1C 7.9 (H)  04/10/2019   ------------------------------------------------------------------------------------------------------------------ No results for input(s): TSH, T4TOTAL, T3FREE, THYROIDAB in the last 72 hours.  Invalid input(s): FREET3 ------------------------------------------------------------------------------------------------------------------ No results for input(s): VITAMINB12, FOLATE, FERRITIN, TIBC, IRON, RETICCTPCT in the last 72 hours.  Coagulation profile Recent Labs  Lab 04/20/19 0215 04/21/19 0605 04/22/19 0651 04/22/19 1437 04/23/19 0445  INR 2.0* 2.4* 2.5* 2.6* 2.8*    Recent Labs    04/22/19 1437  DDIMER <0.27    Cardiac Enzymes No results for input(s): CKMB, TROPONINI, MYOGLOBIN in the last 168 hours.  Invalid input(s): CK ------------------------------------------------------------------------------------------------------------------    Component Value Date/Time   BNP 34.6 04/20/2019 0215    Inpatient Medications  Scheduled Meds: . busPIRone  15 mg Oral BID  . Chlorhexidine Gluconate Cloth  6 each Topical Daily  . dextromethorphan-guaiFENesin  1 tablet Oral BID  . DULoxetine  60 mg Oral Daily  . fentaNYL  1 patch Transdermal Q72H  . [START ON 04/24/2019] furosemide  80 mg Oral BID  . insulin aspart  0-20 Units Subcutaneous TID WC  . insulin aspart  0-5 Units Subcutaneous QHS  . insulin aspart  6 Units Subcutaneous TID WC  . insulin glargine  60 Units Subcutaneous BID  . lamoTRIgine  200 mg Oral BID  . levETIRAcetam  1,000 mg Oral BID  . loratadine  10 mg Oral Daily  . pantoprazole  40 mg Oral BID AC  . pregabalin  50 mg Oral TID  . simvastatin  20 mg Oral QHS  . tamsulosin  0.4 mg Oral QPC supper  . warfarin  1 mg Oral ONCE-1800  . Warfarin - Pharmacist Dosing Inpatient   Does not apply q1800   Continuous Infusions: . sodium  chloride 10 mL/hr at 04/13/19 1200  . Immune Globulin 10%     PRN Meds:.sodium chloride, acetaminophen, albuterol, food thickener, guaiFENesin-dextromethorphan, HYDROcodone-acetaminophen, [DISCONTINUED] ondansetron **OR** ondansetron (ZOFRAN) IV, polyethylene glycol, Resource ThickenUp Clear, sodium chloride  Micro Results No results found for this or any previous visit (from the past 240 hour(s)).  Radiology Reports Dg Chest Port 1 View  Result Date: 04/19/2019 CLINICAL DATA:  Hypoxia. COVID-19 pneumonia. EXAM: PORTABLE CHEST 1 VIEW COMPARISON:  One-view chest x-ray 04/09/2019 FINDINGS: Peripheral airspace opacities are again seen bilaterally. Greatest densities in the right upper lobe. Overall aeration is improved. IMPRESSION: 1. Shifting appearance of extensive bilateral airspace disease compatible with pneumonia. 2. Overall aeration is improved with some residual disease in the right upper lobe. Electronically Signed   By: San Morelle M.D.   On: 04/19/2019 12:37   Dg Chest Port 1 View  Result Date: 04/09/2019 CLINICAL DATA:  Onset respiratory distress today. COVID-19 exposure. EXAM: PORTABLE CHEST 1 VIEW COMPARISON:  PA and lateral chest 01/26/2019.  CT chest 12/28/2018. FINDINGS: Lung volumes are low. There is patchy bilateral airspace disease. Heart size is normal. No pneumothorax or pleural effusion. IMPRESSION: Patchy bilateral airspace disease worrisome for pneumonia including atypical/viral infection. Electronically Signed   By: Inge Rise M.D.   On: 04/09/2019 13:51      Phillips Climes M.D on 04/23/2019 at 2:51 PM  Between 7am to 7pm - Pager - [phone]  After 7pm go to [email or web address] - password [facility]  [facility] -  Office  [phone]

## 2019-04-23 NOTE — Progress Notes (Signed)
ANTICOAGULATION CONSULT NOTE - Follow Up Consult  Pharmacy Consult for warfarin Indication: history of pulmonary embolus and DVT  Allergies  Allergen Reactions  . Bee Venom Anaphylaxis, Shortness Of Breath and Swelling  . Other Shortness Of Breath    Itching, rash with IVP DYE, iodine, shellfish LATEX  . Penicillins Anaphylaxis and Shortness Of Breath    Has patient had a PCN reaction causing immediate rash, facial/tongue/throat swelling, SOB or lightheadedness with hypotension: Yes Has patient had a PCN reaction causing severe rash involving mucus membranes or skin necrosis: No Has patient had a PCN reaction that required hospitalization No Has patient had a PCN reaction occurring within the last 10 years: No If all of the above answers are "NO", then may proceed with Cephalosporin use.   . Shellfish Allergy Nausea And Vomiting and Other (See Comments)    Feels like insides are twisting  . Iodinated Diagnostic Agents     Other reaction(s): RASH  . Iohexol      Code: RASH, Desc: PT WAS ON PREDNISONE FOR GOUT TX. @ TIME OF SCAN AND RECEIVED 50 MG OF BENADRYL IV-ARS 10/08/07   . Iodine Rash  . Latex Rash    Patient Measurements: Height: 6\' 3"  (190.5 cm) Weight: (!) 462 lb (209.6 kg) IBW/kg (Calculated) : 84.5   Vital Signs: Temp: 98.7 F (37.1 C) (10/25 0520) Temp Source: Oral (10/25 0520) BP: 135/69 (10/25 0520) Pulse Rate: 78 (10/25 0600)  Labs: Recent Labs    04/21/19 0605 04/22/19 0651 04/22/19 1437 04/23/19 0445  HGB 10.4* 10.0*  --  9.7*  HCT 33.8* 31.8*  --  31.1*  PLT 59* 51* 57* 46*  APTT  --   --  45*  --   LABPROT 25.8* 26.7* 27.2* 28.8*  INR 2.4* 2.5* 2.6* 2.8*  CREATININE 3.39* 3.60*  --  3.80*    Estimated Creatinine Clearance: 43.3 mL/min (A) (by C-G formula based on SCr of 3.8 mg/dL (H)).  Medications:  Scheduled:  . busPIRone  15 mg Oral BID  . Chlorhexidine Gluconate Cloth  6 each Topical Daily  . dextromethorphan-guaiFENesin  1 tablet  Oral BID  . DULoxetine  60 mg Oral Daily  . fentaNYL  1 patch Transdermal Q72H  . furosemide  80 mg Oral BID  . insulin aspart  0-20 Units Subcutaneous TID WC  . insulin aspart  0-5 Units Subcutaneous QHS  . insulin aspart  6 Units Subcutaneous TID WC  . insulin glargine  50 Units Subcutaneous BID  . lamoTRIgine  200 mg Oral BID  . levETIRAcetam  1,000 mg Oral BID  . loratadine  10 mg Oral Daily  . methylPREDNISolone (SOLU-MEDROL) injection  80 mg Intravenous Q6H  . pantoprazole  40 mg Oral BID AC  . potassium chloride  40 mEq Oral Daily  . pregabalin  50 mg Oral TID  . simvastatin  20 mg Oral QHS  . sodium polystyrene  45 g Oral Once  . tamsulosin  0.4 mg Oral QPC supper  . Warfarin - Pharmacist Dosing Inpatient   Does not apply q1800    Assessment: 53 yo male admitted on 04/09/2019 for SOB and found to be positive for COVID-19. Pharmacy is consulted to dose warfarin for history of DVT and PE. Patient is on warfarin prior to admission. On admission, INR was supratherapeutic and warfarin was been held for three days (10/11-10/13). Warfarin was resumed on 10/14 when INR was therapeutic.   INR 2.8 remains therapeutic, increasing.  Poor intake appears  to be improving (eating 15-100% of meals).  Hgb decreasing further to 9.7, Plt decreased further to 46k (baseline Plt 100-120k).  The patient reports nose bleeding that soaked 7-8 tissues, now stopped, MD aware. Prior to Admission Warfarin Regimen: warfarin 5mg  daily  Goal of Therapy:  INR 2-3 Monitor platelets by anticoagulation protocol: Yes   Plan:  Warfarin 1mg  PO today at 18:00  Monitor daily INR, CBC, and S/S of bleeding   Gretta Arab PharmD, BCPS Clinical pharmacist phone 7am- 5pm: [phone] 04/23/2019 8:14 AM

## 2019-04-24 DIAGNOSIS — J9611 Chronic respiratory failure with hypoxia: Secondary | ICD-10-CM | POA: Diagnosis not present

## 2019-04-24 DIAGNOSIS — D696 Thrombocytopenia, unspecified: Secondary | ICD-10-CM | POA: Diagnosis not present

## 2019-04-24 DIAGNOSIS — U071 COVID-19: Secondary | ICD-10-CM | POA: Diagnosis not present

## 2019-04-24 DIAGNOSIS — N179 Acute kidney failure, unspecified: Secondary | ICD-10-CM | POA: Diagnosis not present

## 2019-04-24 LAB — COMPREHENSIVE METABOLIC PANEL
ALT: 18 U/L (ref 0–44)
AST: 19 U/L (ref 15–41)
Albumin: 2.1 g/dL — ABNORMAL LOW (ref 3.5–5.0)
Alkaline Phosphatase: 55 U/L (ref 38–126)
Anion gap: 11 (ref 5–15)
BUN: 87 mg/dL — ABNORMAL HIGH (ref 6–20)
CO2: 31 mmol/L (ref 22–32)
Calcium: 8.1 mg/dL — ABNORMAL LOW (ref 8.9–10.3)
Chloride: 90 mmol/L — ABNORMAL LOW (ref 98–111)
Creatinine, Ser: 3.66 mg/dL — ABNORMAL HIGH (ref 0.61–1.24)
GFR calc Af Amer: 21 mL/min — ABNORMAL LOW (ref >60)
GFR calc non Af Amer: 18 mL/min — ABNORMAL LOW (ref >60)
Glucose, Bld: 237 mg/dL — ABNORMAL HIGH (ref 70–99)
Potassium: 3.6 mmol/L (ref 3.5–5.1)
Sodium: 132 mmol/L — ABNORMAL LOW (ref 135–145)
Total Bilirubin: 0.4 mg/dL (ref 0.3–1.2)
Total Protein: 7.1 g/dL (ref 6.5–8.1)

## 2019-04-24 LAB — CBC
HCT: 28.6 % — ABNORMAL LOW (ref 39.0–52.0)
Hemoglobin: 9.1 g/dL — ABNORMAL LOW (ref 13.0–17.0)
MCH: 29.9 pg (ref 26.0–34.0)
MCHC: 31.8 g/dL (ref 30.0–36.0)
MCV: 94.1 fL (ref 80.0–100.0)
Platelets: 50 10*3/uL — ABNORMAL LOW (ref 150–400)
RBC: 3.04 MIL/uL — ABNORMAL LOW (ref 4.22–5.81)
RDW: 13 % (ref 11.5–15.5)
WBC: 4 10*3/uL (ref 4.0–10.5)
nRBC: 0 % (ref 0.0–0.2)

## 2019-04-24 LAB — BRAIN NATRIURETIC PEPTIDE: B Natriuretic Peptide: 137.9 pg/mL — ABNORMAL HIGH (ref 0.0–100.0)

## 2019-04-24 LAB — GLUCOSE, CAPILLARY
Glucose-Capillary: 158 mg/dL — ABNORMAL HIGH (ref 70–99)
Glucose-Capillary: 154 mg/dL — ABNORMAL HIGH (ref 70–99)
Glucose-Capillary: 164 mg/dL — ABNORMAL HIGH (ref 70–99)
Glucose-Capillary: 225 mg/dL — ABNORMAL HIGH (ref 70–99)

## 2019-04-24 LAB — PROTIME-INR
INR: 3.6 — ABNORMAL HIGH (ref 0.8–1.2)
Prothrombin Time: 35.6 seconds — ABNORMAL HIGH (ref 11.4–15.2)

## 2019-04-24 MED ORDER — SPIRONOLACTONE 12.5 MG HALF TABLET
12.5000 mg | ORAL_TABLET | Freq: Every day | ORAL | Status: DC
  Administered 2019-04-24 – 2019-04-25 (×2): 12.5 mg via ORAL
  Filled 2019-04-24 (×3): qty 1

## 2019-04-24 NOTE — Plan of Care (Signed)
  Problem: Education: Goal: Knowledge of risk factors and measures for prevention of condition will improve Outcome: Progressing   Problem: Coping: Goal: Psychosocial and spiritual needs will be supported Outcome: Progressing   Problem: Respiratory: Goal: Will maintain a patent airway Outcome: Progressing Goal: Complications related to the disease process, condition or treatment will be avoided or minimized Outcome: Progressing   Problem: Education: Goal: Knowledge of General Education information will improve Description: Including pain rating scale, medication(s)/side effects and non-pharmacologic comfort measures Outcome: Progressing   Problem: Health Behavior/Discharge Planning: Goal: Ability to manage health-related needs will improve Outcome: Progressing   Problem: Clinical Measurements: Goal: Ability to maintain clinical measurements within normal limits will improve Outcome: Progressing Goal: Will remain free from infection Outcome: Progressing Goal: Diagnostic test results will improve Outcome: Progressing Goal: Respiratory complications will improve Outcome: Progressing Goal: Cardiovascular complication will be avoided Outcome: Progressing   Problem: Activity: Goal: Risk for activity intolerance will decrease Outcome: Progressing   Problem: Nutrition: Goal: Adequate nutrition will be maintained Outcome: Progressing   Problem: Elimination: Goal: Will not experience complications related to bowel motility Outcome: Progressing Goal: Will not experience complications related to urinary retention Outcome: Progressing   Problem: Pain Managment: Goal: General experience of comfort will improve Outcome: Progressing   Problem: Skin Integrity: Goal: Risk for impaired skin integrity will decrease Outcome: Progressing

## 2019-04-24 NOTE — Progress Notes (Signed)
  Speech Language Pathology Treatment: Dysphagia  Patient Details Name: JARELL COLAROSSI MRN: [id number] DOB: [birth year] Today's Date: 04/24/2019 Time: [id number] SLP Time Calculation (min) (ACUTE ONLY): 12 min  Assessment / Plan / Recommendation Clinical Impression  Mr. Kozicki demonstrates improved ability to follow through with supraglottic swallow (breath hold then swallow) with only intermittent verbal cues on ten trials of thin water boluses. There were no obvious s/s of aspiration during session today.  He is able to demonstrate teach-back after discussion.  Recommend advancing to thin liquids; continue regular solids.  SLP will f/u x1 to ensure toleration/safety with new diet.  Pt pleased that he is no longer restricted to nectar liquids.   HPI HPI: 53 year old super morbidly obese Caucasian male with history of seizures, DM type II, depression, BPH, OSA, OHS on 2 to 3 L nasal cannula oxygen at home.  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        SLP Plan  Continue with current plan of care       Recommendations  Diet recommendations: Regular;Thin liquid Liquids provided via: Cup;Straw Medication Administration: Whole meds with puree Supervision: Patient able to self feed Compensations: Slow rate;Small sips/bites(hold breath then swallow)                Oral Care Recommendations: Oral care BID Follow up Recommendations: 24 hour supervision/assistance SLP Visit Diagnosis: Dysphagia, oropharyngeal phase (R13.12) Plan: Continue with current plan of care       GO                [name] 04/24/2019, 3:02 PM Thyra Yinger L. Tivis Ringer, [place] Office number [phone]

## 2019-04-24 NOTE — Progress Notes (Signed)
ANTICOAGULATION CONSULT NOTE - Follow Up Consult  Pharmacy Consult for warfarin Indication: history of pulmonary embolus and DVT  Allergies  Allergen Reactions  . Bee Venom Anaphylaxis, Shortness Of Breath and Swelling  . Other Shortness Of Breath    Itching, rash with IVP DYE, iodine, shellfish LATEX  . Penicillins Anaphylaxis and Shortness Of Breath    Has patient had a PCN reaction causing immediate rash, facial/tongue/throat swelling, SOB or lightheadedness with hypotension: Yes Has patient had a PCN reaction causing severe rash involving mucus membranes or skin necrosis: No Has patient had a PCN reaction that required hospitalization No Has patient had a PCN reaction occurring within the last 10 years: No If all of the above answers are "NO", then may proceed with Cephalosporin use.   . Shellfish Allergy Nausea And Vomiting and Other (See Comments)    Feels like insides are twisting  . Iodinated Diagnostic Agents     Other reaction(s): RASH  . Iohexol      Code: RASH, Desc: PT WAS ON PREDNISONE FOR GOUT TX. @ TIME OF SCAN AND RECEIVED 50 MG OF BENADRYL IV-ARS 10/08/07   . Iodine Rash  . Latex Rash    Patient Measurements: Height: 6\' 3"  (190.5 cm) Weight: (!) 417 lb (189.1 kg) IBW/kg (Calculated) : 84.5   Vital Signs: Temp: 97.8 F (36.6 C) (10/26 1245) Temp Source: Oral (10/26 1245) BP: 122/70 (10/26 1245) Pulse Rate: 76 (10/26 1245)  Labs: Recent Labs    04/22/19 0651 04/22/19 1437 04/23/19 0445 04/23/19 1605 04/24/19 0145  HGB 10.0*  --  9.7*  --  9.1*  HCT 31.8*  --  31.1*  --  28.6*  PLT 51* 57* 46*  --  50*  APTT  --  45*  --   --   --   LABPROT 26.7* 27.2* 28.8*  --  35.6*  INR 2.5* 2.6* 2.8*  --  3.6*  CREATININE 3.60*  --  3.80* 4.07* 3.66*    Estimated Creatinine Clearance: 42.2 mL/min (A) (by C-G formula based on SCr of 3.66 mg/dL (H)).  Medications:  Scheduled:  . busPIRone  15 mg Oral BID  . Chlorhexidine Gluconate Cloth  6 each Topical  Daily  . dextromethorphan-guaiFENesin  1 tablet Oral BID  . DULoxetine  60 mg Oral Daily  . fentaNYL  1 patch Transdermal Q72H  . furosemide  80 mg Oral BID  . insulin aspart  0-20 Units Subcutaneous TID WC  . insulin aspart  0-5 Units Subcutaneous QHS  . insulin aspart  6 Units Subcutaneous TID WC  . insulin glargine  60 Units Subcutaneous BID  . lamoTRIgine  200 mg Oral BID  . levETIRAcetam  1,000 mg Oral BID  . loratadine  10 mg Oral Daily  . pantoprazole  40 mg Oral BID AC  . pregabalin  50 mg Oral TID  . simvastatin  20 mg Oral QHS  . spironolactone  12.5 mg Oral Daily  . tamsulosin  0.4 mg Oral QPC supper  . Warfarin - Pharmacist Dosing Inpatient   Does not apply q1800    Assessment: 53 yo male admitted on 04/09/2019 for SOB and found to be positive for COVID-19. Pharmacy is consulted to dose warfarin for history of DVT and PE. Patient is on warfarin prior to admission. On admission, INR was supratherapeutic and warfarin was been held for three days (10/11-10/13). Warfarin was resumed on 10/14 when INR was therapeutic.   INR trended up to 3.6 today. Poor intake  appears to be improving (eating 15-100% of meals).  Hgb decreasing further to 9.1, Plt remain relatively stable at 50k (baseline Plt 100-120k).  Of note, patient had a large bowel movement yesterday with some blood. No s/s of overt bleeding since then.  Prior to Admission Warfarin Regimen: warfarin 5mg  daily  Goal of Therapy:  INR 2-3 Monitor platelets by anticoagulation protocol: Yes   Plan:  Hold warfarin today  Monitor daily INR, CBC, and S/S of bleeding   Albertina Parr, PharmD., BCPS Clinical Pharmacist Clinical phone for 04/24/19 until 5pm: [phone]

## 2019-04-24 NOTE — Progress Notes (Signed)
4L N/C, no noted respiratory distress. Pt is normally on 4L @ home. Needs encouragement on using incentive more.

## 2019-04-24 NOTE — Progress Notes (Signed)
OT Cancellation Note  Patient Details Name: [name] MRN: [id number] DOB: 09/19/1965   Cancelled Treatment:    Reason Eval/Treat Not Completed: Other (comment). Pt declined this pm. Will attempt tomorrow as schedule allows  [facility] 04/24/2019, 2:02 PM  Maurie Boettcher, OT/L   Acute OT Clinical Specialist Acute Rehabilitation Services Pager [phone] Office [phone]

## 2019-04-24 NOTE — Progress Notes (Signed)
Called spouse, no answer, message left to call hospital for updates.

## 2019-04-24 NOTE — Progress Notes (Signed)
Physical Therapy Treatment Patient Details Name: [name] MRN: [id number] DOB: 22-Apr-1966 Today's Date: 04/24/2019    History of Present Illness 53 year old super morbidly obese Caucasian male with history of seizures, DM type II, depression, BPH, OSA, OHS on 2 to 3 L nasal cannula oxygen at home.  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  He was admitted to ICU    PT Comments    Patient in recliner, declines to attempt to stand. Encouraged  popatient to perform. Patient States, I won't have any trouble at Home." Patient  Has  Not demonstrated ability to safely stand and pivot transfer as he did PTA, by patient's report. Continue  Efforts for standing .   Follow Up Recommendations  Home health PT;Supervision/Assistance - 24 hour     Equipment Recommendations  (mechanical lift)    Recommendations for Other Services       Precautions / Restrictions Precautions Precaution Comments: monitor Sats, on 4 L HFNC-finger, . tenor, ,made  chair seat higher with pads.    Mobility  Bed Mobility   Bed Mobility: Rolling Rolling: Min guard         General bed mobility comments: pt rolls self for lift  pad to be removed , rolls each wayu, uses rail., pulls self uppright on rails while in bed.  Transfers                    Ambulation/Gait                 Stairs             Wheelchair Mobility    Modified Rankin (Stroke Patients Only)       Balance                                            Cognition Arousal/Alertness: Awake/alert                                     General Comments: Declined to attempt standing with therapists today, states he has been in recliner 2.5 hours and he is readuy to be lifted to bed.      Exercises      General Comments        Pertinent  Vitals/Pain Pain Assessment: No/denies pain Faces Pain Scale: Hurts little more Pain Location: back Pain Descriptors / Indicators: Discomfort;Crying Pain Intervention(s): Premedicated before session;Monitored during session    Home Living                      Prior Function            PT Goals (current goals can now be found in the care plan section) Progress towards PT goals: Progressing toward goals    Frequency    Min 3X/week      PT Plan Current plan remains appropriate(pt declines snf)    Co-evaluation              AM-PAC PT "6 Clicks" Mobility   Outcome Measure  Help needed turning from your back to your side while in a flat bed without using bedrails?: A Little Help needed moving  from lying on your back to sitting on the side of a flat bed without using bedrails?: A Little Help needed moving to and from a bed to a chair (including a wheelchair)?: Total Help needed standing up from a chair using your arms (e.g., wheelchair or bedside chair)?: Total Help needed to walk in hospital room?: Total Help needed climbing 3-5 steps with a railing? : Total 6 Click Score: 10    End of Session Equipment Utilized During Treatment: Oxygen Activity Tolerance: Patient tolerated treatment well Patient left: in bed;with call bell/phone within reach Nurse Communication: Mobility status;Need for lift equipment       Time: [id number] PT Time Calculation (min) (ACUTE ONLY): 18 min  Charges:  $Therapeutic Activity: 8-22 mins                     Tresa Endo PT Acute Rehabilitation Services Pager [phone] Office [phone]    Claretha Cooper 04/24/2019, 3:01 PM

## 2019-04-24 NOTE — Progress Notes (Addendum)
PROGRESS NOTE                                                                                                                                                                                                             Patient Demographics:    [name], is a 53 y.o. male, DOB - 1966/05/21, [id number]  Admit date - 04/09/2019   Admitting Physician Ejiroghene Arlyce Dice, MD  Outpatient Primary MD for the patient is Sharion Balloon, FNP  LOS - 18   Chief Complaint  Patient presents with  . Respiratory Distress       Brief Narrative    53 year old super morbidly obese Caucasian male with history of seizures, DM type II, depression, BPH, OSA, OHS on 2 to 3 L nasal cannula oxygen at home.  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  He was admitted to ICU stabilized and transferred to hospitalist service on 04/14/2019.   Subjective:    [name] today with no further nosebleed, good bowel movement yesterday, himself denies any complaints, he is eager to go home .   Assessment  & Plan :    Active Problems:   OSA (obstructive sleep apnea)   Obesity hypoventilation syndrome (HCC)   Pneumonia due to COVID-19 virus   Pressure injury of skin   Chronic respiratory failure with hypoxia (HCC)   History of pulmonary embolus (PE)   Morbid obesity with BMI of 60.0-69.9, adult (HCC)   Seizures (HCC)   Diabetes mellitus type 2, uncontrolled, with complications (HCC)   Acute renal failure superimposed on stage 3b chronic kidney disease ([place])  Acute on chronic hypoxic Resp. Failure due to Acute Covid 19 Viral Pneumonitis during the ongoing 2020 Covid 19 Pandemic  -Patient with improvement of oxygen requirement, at one point was 15 L nasal cannula, had improvement of oxygen requirement, and with fluctuating oxygen  requirement, this is most likely due to atelectasis, as he would have increased oxygen requirement if he is not using incentive spirometry or getting out of bed to chair, I have discussed with him at length today, he did refuse to get out of bed to chair last couple days, reports today he will be able to comply with that,  he is unable to prone due to his morbid obesity . -Treated with Decadron -  Also is developing atelectasis from his bedbound status and not moving out of bed compounded by his super morbid obesity.  Encouraged to sit up in chair in the daytime and use I-S and flutter valve for pulmonary toiletry.  Continue monitoring clinically. -Repeat chest x-ray 10/21 showing some mild improvement of his COVID-19 for pneumonia  COVID-19 Labs  Recent Labs    04/22/19 1437  DDIMER <0.27    Lab Results  Component Value Date   SARSCOV2NAA POSITIVE (A) 04/09/2019   OSA/OHS  - currently not on CPAP at home.  Thrombocytopenia -Appears to be chronic, baseline platelet count 90-100K, did follow with oncology Dr. Delton Coombes in the past, this was thought to be secondary to chronic splenomegaly, with negative work-up as an outpatient . -Patient with worsening thrombocytopenia, discussed with hematology Dr. Jana Hakim 10/24, rule out TTP with DIC panel, no evidence of schistocytes, so no evidence of TTP, titp, commended to try 1 dose of IV Decadron in this current setting, even though low probability for ITP, he did receive IVIG 10/25. -  short course of high-dose trial of Solu-Medrol 80 mg IV every 6 hours X4 doses given.  History of PE and DVT.  - On Coumadin INR therapeutic.  Pharmacy monitoring. -By reviewing history, patient with multiple history of PE/DVTs in the past  Hyperkalemia -Adding Aldactone, and potassium supplement, it did improve, 3.6 today, will resume low-dose Aldactone to avoid hypokalemia in the setting of significant Lasix dosing .  Hx seizure  - Stable, continue  combination of Keppra and Lamictal.   Dyslipidemia.   - On Zocor.  Morbid obesity BMI of 57.   - Outpatient follow-up PCP for weight loss.  Deconditioning.   - PT OT consulted.  Sit to sit up in chair and daytime.  Depression and bipolar disorder.  - Continue buspirone, Cymbalta and Lyrica combination.  AKI on CKD 4.   - Baseline creatinine 3.5.  Initially had AKI due to ATN now improving with ongoing diuresis with oral Lasix 80 mg twice daily.   DM type II.   - On Lantus and sliding scale combination dose adjusted 10/18.  Outpatient glycemic control is slightly poor due to hyperglycemia.  A1c was 7.9, lowered Lantus to 50 units subcu twice daily given morning CBG is 74.  Urinary retention -Patient failed voiding trial 10/23, Foley catheter reinserted 10/24. -started on  Flomax   Code Status :Full  Family Communication  : Discussed with patient, tried to call wife, left her voicemail  Disposition Plan  : Home with Home health   Consults  : None  Procedures  : None  DVT Prophylaxis  :  On warfarin  Lab Results  Component Value Date   PLT 50 (L) 04/24/2019    Antibiotics  :    Anti-infectives (From admission, onward)   Start     Dose/Rate Route Frequency Ordered Stop   04/11/19 1000  remdesivir 100 mg in sodium chloride 0.9 % 250 mL IVPB     100 mg 500 mL/hr over 30 Minutes Intravenous Every 24 hours 04/10/19 0000 04/14/19 1035   04/10/19 0100  remdesivir 200 mg in sodium chloride 0.9 % 250 mL IVPB     200 mg 500 mL/hr over 30 Minutes Intravenous Once 04/10/19 0000 04/10/19 0138        Objective:   Vitals:   04/23/19 1815 04/23/19 2030 04/24/19 0400 04/24/19 0732  BP: 126/64 137/70 (!) 112/55 132/62  Pulse: 85 88 80 78  Resp:  20 17 19   Temp:  (!) 97.4 F (36.3 C) (!) 97.5 F (36.4 C) 98 F (36.7 C)  TempSrc:  Oral Oral Oral  SpO2: (!) 89% 92% 90% 90%  Weight:      Height:        Wt Readings from Last 3 Encounters:  04/23/19 (!) 189.1 kg   02/08/19 (!) 205.2 kg  11/03/17 (!) 205.2 kg     Intake/Output Summary (Last 24 hours) at 04/24/2019 1141 Last data filed at 04/24/2019 1000 Gross per 24 hour  Intake 780 ml  Output 2480 ml  Net -1700 ml     Physical Exam  Awake Alert, Oriented X 3, No new F.N deficits, Normal affect Symmetrical Chest wall movement, Good air movement bilaterally, CTAB RRR,No Gallops,Rubs or new Murmurs, No Parasternal Heave +ve B.Sounds, Abd Soft, No tenderness, No rebound - guarding or rigidity. No Cyanosis, Clubbing ,+1edema, No new Rash or bruise       Data Review:    CBC Recent Labs  Lab 04/18/19 0315 04/19/19 0125 04/20/19 0215 04/21/19 [id number] 04/22/19 0651 04/22/19 1437 04/23/19 0445 04/24/19 0145  WBC 2.9* 2.5* 3.6* 4.2 4.2  --  2.2* 4.0  HGB 10.8* 10.3* 10.2* 10.4* 10.0*  --  9.7* 9.1*  HCT 34.3* 33.0* 32.5* 33.8* 31.8*  --  31.1* 28.6*  PLT 75* 64* 58* 59* 51* 57* 46* 50*  MCV 95.3 95.4 96.2 95.8 95.8  --  95.4 94.1  MCH 30.0 29.8 30.2 29.5 30.1  --  29.8 29.9  MCHC 31.5 31.2 31.4 30.8 31.4  --  31.2 31.8  RDW 13.9 13.8 13.5 13.4 13.4  --  13.1 13.0  LYMPHSABS 0.5* 0.4*  --   --   --   --   --   --   MONOABS 0.3 0.2  --   --   --   --   --   --   EOSABS 0.0 0.0  --   --   --   --   --   --   BASOSABS 0.0 0.0  --   --   --   --   --   --     Chemistries  Recent Labs  Lab 04/18/19 0315 04/19/19 0125 04/20/19 0215 04/21/19 [id number] 04/22/19 0651 04/23/19 0445 04/23/19 1605 04/24/19 0145  NA 141 137 140 139 136 132* 134* 132*  K 4.3 4.3 3.8 4.0 4.0 5.6* 5.4* 3.6  CL 98 95* 95* 95* 93* 93* 92* 90*  CO2 32 31 32 33* 32 30 29 31   GLUCOSE 166* 191* 133* 69* 156* 403* 348* 237*  BUN 97* 96* 93* 84* 85* 90* 89* 87*  CREATININE 3.75* 3.68* 3.60* 3.39* 3.60* 3.80* 4.07* 3.66*  CALCIUM 8.1* 8.0* 8.0* 8.1* 7.8* 8.2* 8.1* 8.1*  MG 2.1 2.2  --   --   --   --   --   --   AST 31 30 28  32 30 25  --  19  ALT 23 20 19 22 20 22   --  18  ALKPHOS 64 63 59 65 61 69  --  55   BILITOT 1.2 1.0 1.2 0.7 1.1 0.9  --  0.4   ------------------------------------------------------------------------------------------------------------------ No results for input(s): CHOL, HDL, LDLCALC, TRIG, CHOLHDL, LDLDIRECT in the last 72 hours.  Lab Results  Component Value Date   HGBA1C 7.9 (H) 04/10/2019   ------------------------------------------------------------------------------------------------------------------ No  results for input(s): TSH, T4TOTAL, T3FREE, THYROIDAB in the last 72 hours.  Invalid input(s): FREET3 ------------------------------------------------------------------------------------------------------------------ No results for input(s): VITAMINB12, FOLATE, FERRITIN, TIBC, IRON, RETICCTPCT in the last 72 hours.  Coagulation profile Recent Labs  Lab 04/21/19 0605 04/22/19 0651 04/22/19 1437 04/23/19 0445 04/24/19 0145  INR 2.4* 2.5* 2.6* 2.8* 3.6*    Recent Labs    04/22/19 1437  DDIMER <0.27    Cardiac Enzymes No results for input(s): CKMB, TROPONINI, MYOGLOBIN in the last 168 hours.  Invalid input(s): CK ------------------------------------------------------------------------------------------------------------------    Component Value Date/Time   BNP 137.9 (H) 04/24/2019 0145    Inpatient Medications  Scheduled Meds: . busPIRone  15 mg Oral BID  . Chlorhexidine Gluconate Cloth  6 each Topical Daily  . dextromethorphan-guaiFENesin  1 tablet Oral BID  . DULoxetine  60 mg Oral Daily  . fentaNYL  1 patch Transdermal Q72H  . furosemide  80 mg Oral BID  . insulin aspart  0-20 Units Subcutaneous TID WC  . insulin aspart  0-5 Units Subcutaneous QHS  . insulin aspart  6 Units Subcutaneous TID WC  . insulin glargine  60 Units Subcutaneous BID  . lamoTRIgine  200 mg Oral BID  . levETIRAcetam  1,000 mg Oral BID  . loratadine  10 mg Oral Daily  . pantoprazole  40 mg Oral BID AC  . pregabalin  50 mg Oral TID  . simvastatin  20 mg Oral  QHS  . tamsulosin  0.4 mg Oral QPC supper  . Warfarin - Pharmacist Dosing Inpatient   Does not apply q1800   Continuous Infusions: . sodium chloride 10 mL/hr at 04/13/19 1200   PRN Meds:.sodium chloride, acetaminophen, albuterol, food thickener, guaiFENesin-dextromethorphan, HYDROcodone-acetaminophen, [DISCONTINUED] ondansetron **OR** ondansetron (ZOFRAN) IV, polyethylene glycol, Resource ThickenUp Clear, sodium chloride  Micro Results No results found for this or any previous visit (from the past 240 hour(s)).  Radiology Reports Dg Chest Port 1 View  Result Date: 04/19/2019 CLINICAL DATA:  Hypoxia. COVID-19 pneumonia. EXAM: PORTABLE CHEST 1 VIEW COMPARISON:  One-view chest x-ray 04/09/2019 FINDINGS: Peripheral airspace opacities are again seen bilaterally. Greatest densities in the right upper lobe. Overall aeration is improved. IMPRESSION: 1. Shifting appearance of extensive bilateral airspace disease compatible with pneumonia. 2. Overall aeration is improved with some residual disease in the right upper lobe. Electronically Signed   By: San Morelle M.D.   On: 04/19/2019 12:37   Dg Chest Port 1 View  Result Date: 04/09/2019 CLINICAL DATA:  Onset respiratory distress today. COVID-19 exposure. EXAM: PORTABLE CHEST 1 VIEW COMPARISON:  PA and lateral chest 01/26/2019.  CT chest 12/28/2018. FINDINGS: Lung volumes are low. There is patchy bilateral airspace disease. Heart size is normal. No pneumothorax or pleural effusion. IMPRESSION: Patchy bilateral airspace disease worrisome for pneumonia including atypical/viral infection. Electronically Signed   By: Inge Rise M.D.   On: 04/09/2019 13:51      Phillips Climes M.D on 04/24/2019 at 11:41 AM  Between 7am to 7pm - Pager - [phone]  After 7pm go to [email or web address] - password [facility]  [facility] -  Office  [phone]

## 2019-04-25 ENCOUNTER — Telehealth (HOSPITAL_COMMUNITY): Payer: Self-pay | Admitting: *Deleted

## 2019-04-25 DIAGNOSIS — E118 Type 2 diabetes mellitus with unspecified complications: Secondary | ICD-10-CM | POA: Diagnosis not present

## 2019-04-25 DIAGNOSIS — N179 Acute kidney failure, unspecified: Secondary | ICD-10-CM | POA: Diagnosis not present

## 2019-04-25 DIAGNOSIS — Z86711 Personal history of pulmonary embolism: Secondary | ICD-10-CM | POA: Diagnosis not present

## 2019-04-25 DIAGNOSIS — U071 COVID-19: Secondary | ICD-10-CM | POA: Diagnosis not present

## 2019-04-25 DIAGNOSIS — D696 Thrombocytopenia, unspecified: Secondary | ICD-10-CM

## 2019-04-25 LAB — GLUCOSE, CAPILLARY
Glucose-Capillary: 192 mg/dL — ABNORMAL HIGH (ref 70–99)
Glucose-Capillary: 203 mg/dL — ABNORMAL HIGH (ref 70–99)
Glucose-Capillary: 278 mg/dL — ABNORMAL HIGH (ref 70–99)

## 2019-04-25 LAB — COMPREHENSIVE METABOLIC PANEL
ALT: 19 U/L (ref 0–44)
AST: 22 U/L (ref 15–41)
Albumin: 2.2 g/dL — ABNORMAL LOW (ref 3.5–5.0)
Alkaline Phosphatase: 58 U/L (ref 38–126)
Anion gap: 9 (ref 5–15)
BUN: 99 mg/dL — ABNORMAL HIGH (ref 6–20)
CO2: 33 mmol/L — ABNORMAL HIGH (ref 22–32)
Calcium: 7.6 mg/dL — ABNORMAL LOW (ref 8.9–10.3)
Chloride: 90 mmol/L — ABNORMAL LOW (ref 98–111)
Creatinine, Ser: 3.74 mg/dL — ABNORMAL HIGH (ref 0.61–1.24)
GFR calc Af Amer: 20 mL/min — ABNORMAL LOW (ref >60)
GFR calc non Af Amer: 17 mL/min — ABNORMAL LOW (ref >60)
Glucose, Bld: 186 mg/dL — ABNORMAL HIGH (ref 70–99)
Potassium: 4 mmol/L (ref 3.5–5.1)
Sodium: 132 mmol/L — ABNORMAL LOW (ref 135–145)
Total Bilirubin: 0.3 mg/dL (ref 0.3–1.2)
Total Protein: 6.9 g/dL (ref 6.5–8.1)

## 2019-04-25 LAB — PROTIME-INR
INR: 2.5 — ABNORMAL HIGH (ref 0.8–1.2)
Prothrombin Time: 26.2 seconds — ABNORMAL HIGH (ref 11.4–15.2)

## 2019-04-25 LAB — CBC
HCT: 28.9 % — ABNORMAL LOW (ref 39.0–52.0)
Hemoglobin: 9.1 g/dL — ABNORMAL LOW (ref 13.0–17.0)
MCH: 29.9 pg (ref 26.0–34.0)
MCHC: 31.5 g/dL (ref 30.0–36.0)
MCV: 95.1 fL (ref 80.0–100.0)
Platelets: 44 10*3/uL — ABNORMAL LOW (ref 150–400)
RBC: 3.04 MIL/uL — ABNORMAL LOW (ref 4.22–5.81)
RDW: 13.4 % (ref 11.5–15.5)
WBC: 2.9 10*3/uL — ABNORMAL LOW (ref 4.0–10.5)
nRBC: 0 % (ref 0.0–0.2)

## 2019-04-25 MED ORDER — WARFARIN SODIUM 3 MG PO TABS
3.0000 mg | ORAL_TABLET | Freq: Once | ORAL | Status: AC
  Administered 2019-04-25: 3 mg via ORAL
  Filled 2019-04-25: qty 1

## 2019-04-25 MED ORDER — TOUJEO SOLOSTAR 300 UNIT/ML ~~LOC~~ SOPN: 60.0000 [IU] | PEN_INJECTOR | Freq: Two times a day (BID) | SUBCUTANEOUS | 3 refills | Status: DC

## 2019-04-25 MED ORDER — WARFARIN SODIUM 3 MG PO TABS: 3.0000 mg | ORAL_TABLET | Freq: Once | ORAL | 0 refills | Status: DC

## 2019-04-25 MED ORDER — FENTANYL 75 MCG/HR TD PT72: 1.0000 | MEDICATED_PATCH | TRANSDERMAL | 0 refills | Status: DC

## 2019-04-25 MED ORDER — INSULIN LISPRO (1 UNIT DIAL) 100 UNIT/ML (KWIKPEN): 25.0000 [IU] | PEN_INJECTOR | Freq: Three times a day (TID) | SUBCUTANEOUS | 0 refills | Status: DC

## 2019-04-25 NOTE — Progress Notes (Signed)
ANTICOAGULATION CONSULT NOTE - Follow Up Consult  Pharmacy Consult for warfarin Indication: history of pulmonary embolus and DVT  Allergies  Allergen Reactions  . Bee Venom Anaphylaxis, Shortness Of Breath and Swelling  . Other Shortness Of Breath    Itching, rash with IVP DYE, iodine, shellfish LATEX  . Penicillins Anaphylaxis and Shortness Of Breath    Has patient had a PCN reaction causing immediate rash, facial/tongue/throat swelling, SOB or lightheadedness with hypotension: Yes Has patient had a PCN reaction causing severe rash involving mucus membranes or skin necrosis: No Has patient had a PCN reaction that required hospitalization No Has patient had a PCN reaction occurring within the last 10 years: No If all of the above answers are "NO", then may proceed with Cephalosporin use.   . Shellfish Allergy Nausea And Vomiting and Other (See Comments)    Feels like insides are twisting  . Iodinated Diagnostic Agents     Other reaction(s): RASH  . Iohexol      Code: RASH, Desc: PT WAS ON PREDNISONE FOR GOUT TX. @ TIME OF SCAN AND RECEIVED 50 MG OF BENADRYL IV-ARS 10/08/07   . Iodine Rash  . Latex Rash    Patient Measurements: Height: 6\' 3"  (190.5 cm) Weight: (!) 417 lb (189.1 kg) IBW/kg (Calculated) : 84.5   Vital Signs: Temp: 98.2 F (36.8 C) (10/27 0800) Temp Source: Oral (10/27 0800) BP: 143/79 (10/27 0800) Pulse Rate: 79 (10/27 0800)  Labs: Recent Labs    04/22/19 1437  04/23/19 0445 04/23/19 1605 04/24/19 0145 04/25/19 0350 04/25/19 0500  HGB  --    < > 9.7*  --  9.1*  --  9.1*  HCT  --   --  31.1*  --  28.6*  --  28.9*  PLT 57*  --  46*  --  50*  --  44*  APTT 45*  --   --   --   --   --   --   LABPROT 27.2*  --  28.8*  --  35.6* 26.2*  --   INR 2.6*  --  2.8*  --  3.6* 2.5*  --   CREATININE  --    < > 3.80* 4.07* 3.66*  --  3.74*   < > = values in this interval not displayed.    Estimated Creatinine Clearance: 41.3 mL/min (A) (by C-G formula based  on SCr of 3.74 mg/dL (H)).  Medications:  Scheduled:  . busPIRone  15 mg Oral BID  . Chlorhexidine Gluconate Cloth  6 each Topical Daily  . dextromethorphan-guaiFENesin  1 tablet Oral BID  . DULoxetine  60 mg Oral Daily  . fentaNYL  1 patch Transdermal Q72H  . furosemide  80 mg Oral BID  . insulin aspart  0-20 Units Subcutaneous TID WC  . insulin aspart  0-5 Units Subcutaneous QHS  . insulin aspart  6 Units Subcutaneous TID WC  . insulin glargine  60 Units Subcutaneous BID  . lamoTRIgine  200 mg Oral BID  . levETIRAcetam  1,000 mg Oral BID  . loratadine  10 mg Oral Daily  . pantoprazole  40 mg Oral BID AC  . pregabalin  50 mg Oral TID  . simvastatin  20 mg Oral QHS  . spironolactone  12.5 mg Oral Daily  . tamsulosin  0.4 mg Oral QPC supper  . warfarin  3 mg Oral ONCE-1800  . Warfarin - Pharmacist Dosing Inpatient   Does not apply q1800    Assessment: 53  yo male admitted on 04/09/2019 for SOB and found to be positive for COVID-19. Pharmacy is consulted to dose warfarin for history of DVT and PE. Patient is on warfarin prior to admission. On admission, INR was supratherapeutic and warfarin was been held for three days (10/11-10/13). Warfarin was resumed on 10/14 when INR was therapeutic.   INR has trended down to 2.5 today. Poor intake appears to be improving (eating 100% of meals).  Hgb stable at 9.1, Plt remain low but relatively stable at 44k (baseline Plt 100-120k).    Prior to Admission Warfarin Regimen: warfarin 5mg  daily  Goal of Therapy:  INR 2-3 Monitor platelets by anticoagulation protocol: Yes   Plan:  Warfarin 3 mg x 1 dose. Consider 3 mg daily upon discharge with outpatient follow up  Monitor daily INR, CBC, and S/S of bleeding   Albertina Parr, PharmD., BCPS Clinical Pharmacist Clinical phone for 04/25/19 until 5pm: [phone]

## 2019-04-25 NOTE — Telephone Encounter (Signed)
-----   Message from Veronda Prude sent at 04/25/2019  2:19 PM EDT ----- Regarding: Orders for labs Caryl Pina,   Can you please put in the orders for CBC and PT/INR for Monday?  Thx, DD ----- Message ----- From: Derek Jack, MD Sent: 04/25/2019   1:59 PM EDT To: Aretta Nip Subject: FW: patient with low platelets                 Please schedule this patient for follow-up with one of the girls on Monday.  He should have CBC and PT/INR done.Thanks. ----- Message ----- From: Heath Lark, MD Sent: 04/25/2019   1:45 PM EDT To: Derek Jack, MD Subject: patient with low platelets                     Hi,  I reviewed this case with the hospitalist because I am on call today He will be DC soon after recovery from [place] The hospitalist is concerned about low platelets. They have ordered different tests and it is not TTP/DIC, likely bone marrow suppression from recent COVID. Per 21 days protocol from positive test in 10/11, he should be able to return back to see you/primary doctor next week. I hope you don't mind bringing him back to the office for platelets check He is on warfarin without bleeding complications  Thanks, Ni

## 2019-04-25 NOTE — Discharge Instructions (Signed)
Person Under Monitoring Name: [name]  Location: [place] [address]   Infection Prevention Recommendations for Individuals Confirmed to have, or Being Evaluated for, 2019 Novel Coronavirus (COVID-19) Infection Who Receive Care at Home  Individuals who are confirmed to have, or are being evaluated for, COVID-19 should follow the prevention steps below until a healthcare provider or local or state health department says they can return to normal activities.  Stay home except to get medical care You should restrict activities outside your home, except for getting medical care. Do not go to work, school, or public areas, and do not use public transportation or taxis.  Call ahead before visiting your doctor Before your medical appointment, call the healthcare provider and tell them that you have, or are being evaluated for, COVID-19 infection. This will help the healthcare providers office take steps to keep other people from getting infected. Ask your healthcare provider to call the local or state health department.  Monitor your symptoms Seek prompt medical attention if your illness is worsening (e.g., difficulty breathing). Before going to your medical appointment, call the healthcare provider and tell them that you have, or are being evaluated for, COVID-19 infection. Ask your healthcare provider to call the local or state health department.  Wear a facemask You should wear a facemask that covers your nose and mouth when you are in the same room with other people and when you visit a healthcare provider. People who live with or visit you should also wear a facemask while they are in the same room with you.  Separate yourself from other people in your home As much as possible, you should stay in a different room from other people in your home. Also, you should use a separate bathroom, if available.  Avoid sharing household items You should not share  dishes, drinking glasses, cups, eating utensils, towels, bedding, or other items with other people in your home. After using these items, you should wash them thoroughly with soap and water.  Cover your coughs and sneezes Cover your mouth and nose with a tissue when you cough or sneeze, or you can cough or sneeze into your sleeve. Throw used tissues in a lined trash can, and immediately wash your hands with soap and water for at least 20 seconds or use an alcohol-based hand rub.  Wash your [facility] your hands often and thoroughly with soap and water for at least 20 seconds. You can use an alcohol-based hand sanitizer if soap and water are not available and if your hands are not visibly dirty. Avoid touching your eyes, nose, and mouth with unwashed hands.   Prevention Steps for Caregivers and Household Members of Individuals Confirmed to have, or Being Evaluated for, COVID-19 Infection Being Cared for in the Home  If you live with, or provide care at home for, a person confirmed to have, or being evaluated for, COVID-19 infection please follow these guidelines to prevent infection:  Follow healthcare providers instructions Make sure that you understand and can help the patient follow any healthcare provider instructions for all care.  Provide for the patients basic needs You should help the patient with basic needs in the home and provide support for getting groceries, prescriptions, and other personal needs.  Monitor the patients symptoms If they are getting sicker, call his or her medical provider and tell them that the patient has, or is being evaluated for, COVID-19 infection. This will help the healthcare providers office  take steps to keep other people from getting infected. Ask the healthcare provider to call the local or state health department.  Limit the number of people who have contact with the patient  If possible, have only one caregiver for the patient.  Other  household members should stay in another home or place of residence. If this is not possible, they should stay  in another room, or be separated from the patient as much as possible. Use a separate bathroom, if available.  Restrict visitors who do not have an essential need to be in the home.  Keep older adults, very young children, and other sick people away from the patient Keep older adults, very young children, and those who have compromised immune systems or chronic health conditions away from the patient. This includes people with chronic heart, lung, or kidney conditions, diabetes, and cancer.  Ensure good ventilation Make sure that shared spaces in the home have good air flow, such as from an air conditioner or an opened window, weather permitting.  Wash your hands often  Wash your hands often and thoroughly with soap and water for at least 20 seconds. You can use an alcohol based hand sanitizer if soap and water are not available and if your hands are not visibly dirty.  Avoid touching your eyes, nose, and mouth with unwashed hands.  Use disposable paper towels to dry your hands. If not available, use dedicated cloth towels and replace them when they become wet.  Wear a facemask and gloves  Wear a disposable facemask at all times in the room and gloves when you touch or have contact with the patients blood, body fluids, and/or secretions or excretions, such as sweat, saliva, sputum, nasal mucus, vomit, urine, or feces.  Ensure the mask fits over your nose and mouth tightly, and do not touch it during use.  Throw out disposable facemasks and gloves after using them. Do not reuse.  Wash your hands immediately after removing your facemask and gloves.  If your personal clothing becomes contaminated, carefully remove clothing and launder. Wash your hands after handling contaminated clothing.  Place all used disposable facemasks, gloves, and other waste in a lined container before  disposing them with other household waste.  Remove gloves and wash your hands immediately after handling these items.  Do not share dishes, glasses, or other household items with the patient  Avoid sharing household items. You should not share dishes, drinking glasses, cups, eating utensils, towels, bedding, or other items with a patient who is confirmed to have, or being evaluated for, COVID-19 infection.  After the person uses these items, you should wash them thoroughly with soap and water.  Wash laundry thoroughly  Immediately remove and wash clothes or bedding that have blood, body fluids, and/or secretions or excretions, such as sweat, saliva, sputum, nasal mucus, vomit, urine, or feces, on them.  Wear gloves when handling laundry from the patient.  Read and follow directions on labels of laundry or clothing items and detergent. In general, wash and dry with the warmest temperatures recommended on the label.  Clean all areas the individual has used often  Clean all touchable surfaces, such as counters, tabletops, doorknobs, bathroom fixtures, toilets, phones, keyboards, tablets, and bedside tables, every day. Also, clean any surfaces that may have blood, body fluids, and/or secretions or excretions on them.  Wear gloves when cleaning surfaces the patient has come in contact with.  Use a diluted bleach solution (e.g., dilute bleach with 1 part  bleach and 10 parts water) or a household disinfectant with a label that says EPA-registered for coronaviruses. To make a bleach solution at home, add 1 tablespoon of bleach to 1 quart (4 cups) of water. For a larger supply, add  cup of bleach to 1 gallon (16 cups) of water.  Read labels of cleaning products and follow recommendations provided on product labels. Labels contain instructions for safe and effective use of the cleaning product including precautions you should take when applying the product, such as wearing gloves or eye protection  and making sure you have good ventilation during use of the product.  Remove gloves and wash hands immediately after cleaning.  Monitor yourself for signs and symptoms of illness Caregivers and household members are considered close contacts, should monitor their health, and will be asked to limit movement outside of the home to the extent possible. Follow the monitoring steps for close contacts listed on the symptom monitoring form.   ? If you have additional questions, contact your local health department or call the epidemiologist on call at [phone] (available 24/7). ? This guidance is subject to change. For the most up-to-date guidance from [facility], please refer to their website: [email or web address]

## 2019-04-25 NOTE — TOC Transition Note (Signed)
Transition of Care [facility]) - CM/SW Discharge Note   Patient Details  Name: SAURAV SHARER MRN: [id number] Date of Birth: [birth year]  [facility] CM/SW Contact:  Weston Anna, LCSW Phone Number: 04/25/2019, 2:30 PM   Clinical Narrative:     Patient set to discharge home with [facility] services in place- [facility] accepted referral- CSW notified Tommi Rumps of patients discharge. Patient uses chronic o2 at home- no need for new orders for o2. All orders completed by MD.   Final next level of care: Home w Home Health Services Barriers to Discharge: No Barriers Identified   Patient Goals and CMS Choice        Discharge Placement                       Discharge Plan and Services   Discharge Planning Services: CM Consult Post Acute Care Choice: Home Health          DME Arranged: N/A         HH Arranged: PT, OT HH Agency: [facility] [place] Date [place]: 04/22/19 Time [place]: [phone] Representative spoke with at [place]: cory  Social Determinants of Health ([place]) Interventions     Readmission Risk Interventions No flowsheet data found.
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
## 2019-04-25 NOTE — Progress Notes (Signed)
  Speech Language Pathology Treatment: Dysphagia  Patient Details Name: [name] MRN: [id number] DOB: Jun 05, 1966 Today's Date: 04/25/2019 Time: 4098-1191 SLP Time Calculation (min) (ACUTE ONLY): 16 min  Assessment / Plan / Recommendation Clinical Impression  Assisted pt with set-up of breakfast tray.  He is able to verbalize strategy to improve safety with thin liquids.  Executes supraglottic swallow with only occasional verbal cues.  Consumed solids, thin liquids, and mixed consistencies from meal tray with adequate attention to precautions and no overt s/s of aspiration.  Recommend continuing current regular diet, thin liquids. No further SLP f/u is needed. Our service will sign off.   HPI HPI: 53 year old super morbidly obese Caucasian male with history of seizures, DM type II, depression, BPH, OSA, OHS on 2 to 3 L nasal cannula oxygen at home.  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        SLP Plan  All goals met       Recommendations  Diet recommendations: Regular;Thin liquid Liquids provided via: Cup;Straw Medication Administration: Whole meds with puree Supervision: Patient able to self feed Compensations: Other (Comment)(supraglottic swallow) Postural Changes and/or Swallow Maneuvers: Seated upright 90 degrees                Oral Care Recommendations: Oral care BID Follow up Recommendations: 24 hour supervision/assistance SLP Visit Diagnosis: Dysphagia, oropharyngeal phase (R13.12) Plan: All goals met       GO               [name] L. Tivis Ringer, [place] CCC/SLP Acute Rehabilitation Services Office number [phone]  [name] 04/25/2019, 9:02 AM

## 2019-04-25 NOTE — Progress Notes (Signed)
Wife updated, no questions at this time.

## 2019-04-26 DIAGNOSIS — M159 Polyosteoarthritis, unspecified: Secondary | ICD-10-CM | POA: Diagnosis not present

## 2019-04-26 DIAGNOSIS — J9611 Chronic respiratory failure with hypoxia: Secondary | ICD-10-CM | POA: Diagnosis not present

## 2019-04-26 DIAGNOSIS — U071 COVID-19: Secondary | ICD-10-CM | POA: Diagnosis not present

## 2019-04-26 DIAGNOSIS — E1140 Type 2 diabetes mellitus with diabetic neuropathy, unspecified: Secondary | ICD-10-CM | POA: Diagnosis not present

## 2019-04-26 DIAGNOSIS — J1289 Other viral pneumonia: Secondary | ICD-10-CM | POA: Diagnosis not present

## 2019-04-27 ENCOUNTER — Ambulatory Visit: Payer: Medicare Other | Admitting: Family

## 2019-05-01 ENCOUNTER — Other Ambulatory Visit (HOSPITAL_COMMUNITY): Payer: Medicare Other

## 2019-05-01 ENCOUNTER — Ambulatory Visit (HOSPITAL_COMMUNITY): Payer: Medicare Other | Admitting: Hematology

## 2019-05-01 DIAGNOSIS — J1289 Other viral pneumonia: Secondary | ICD-10-CM | POA: Diagnosis not present

## 2019-05-01 DIAGNOSIS — M159 Polyosteoarthritis, unspecified: Secondary | ICD-10-CM | POA: Diagnosis not present

## 2019-05-01 DIAGNOSIS — U071 COVID-19: Secondary | ICD-10-CM | POA: Diagnosis not present

## 2019-05-01 DIAGNOSIS — E1165 Type 2 diabetes mellitus with hyperglycemia: Secondary | ICD-10-CM | POA: Diagnosis not present

## 2019-05-01 DIAGNOSIS — J9621 Acute and chronic respiratory failure with hypoxia: Secondary | ICD-10-CM | POA: Diagnosis not present

## 2019-05-02 DIAGNOSIS — M159 Polyosteoarthritis, unspecified: Secondary | ICD-10-CM | POA: Diagnosis not present

## 2019-05-02 DIAGNOSIS — U071 COVID-19: Secondary | ICD-10-CM | POA: Diagnosis not present

## 2019-05-02 DIAGNOSIS — J9621 Acute and chronic respiratory failure with hypoxia: Secondary | ICD-10-CM | POA: Diagnosis not present

## 2019-05-02 DIAGNOSIS — E1165 Type 2 diabetes mellitus with hyperglycemia: Secondary | ICD-10-CM | POA: Diagnosis not present

## 2019-05-02 DIAGNOSIS — J1289 Other viral pneumonia: Secondary | ICD-10-CM | POA: Diagnosis not present

## 2019-05-03 ENCOUNTER — Other Ambulatory Visit: Payer: Self-pay | Admitting: Family

## 2019-05-03 ENCOUNTER — Telehealth: Payer: Self-pay | Admitting: Family

## 2019-05-03 ENCOUNTER — Ambulatory Visit (HOSPITAL_COMMUNITY): Payer: Medicare Other | Admitting: Hematology

## 2019-05-03 ENCOUNTER — Inpatient Hospital Stay (HOSPITAL_COMMUNITY): Payer: Medicare Other

## 2019-05-03 ENCOUNTER — Other Ambulatory Visit: Payer: Self-pay | Admitting: *Deleted

## 2019-05-03 DIAGNOSIS — F411 Generalized anxiety disorder: Secondary | ICD-10-CM

## 2019-05-03 DIAGNOSIS — Z20822 Contact with and (suspected) exposure to covid-19: Secondary | ICD-10-CM

## 2019-05-03 NOTE — Telephone Encounter (Signed)
HH not able to do just labwork unless there is another skilled nursing need. F2F video visit set up for Friday

## 2019-05-04 LAB — NOVEL CORONAVIRUS, NAA: SARS-CoV-2, NAA: NOT DETECTED

## 2019-05-05 ENCOUNTER — Encounter: Payer: Self-pay | Admitting: Family

## 2019-05-05 ENCOUNTER — Telehealth (INDEPENDENT_AMBULATORY_CARE_PROVIDER_SITE_OTHER): Payer: Medicare Other | Admitting: Family

## 2019-05-05 DIAGNOSIS — Z09 Encounter for follow-up examination after completed treatment for conditions other than malignant neoplasm: Secondary | ICD-10-CM

## 2019-05-05 DIAGNOSIS — J1289 Other viral pneumonia: Secondary | ICD-10-CM | POA: Diagnosis not present

## 2019-05-05 DIAGNOSIS — E1165 Type 2 diabetes mellitus with hyperglycemia: Secondary | ICD-10-CM | POA: Diagnosis not present

## 2019-05-05 DIAGNOSIS — U071 COVID-19: Secondary | ICD-10-CM | POA: Diagnosis not present

## 2019-05-05 DIAGNOSIS — E662 Morbid (severe) obesity with alveolar hypoventilation: Secondary | ICD-10-CM

## 2019-05-05 DIAGNOSIS — J9621 Acute and chronic respiratory failure with hypoxia: Secondary | ICD-10-CM | POA: Diagnosis not present

## 2019-05-05 DIAGNOSIS — Z794 Long term (current) use of insulin: Secondary | ICD-10-CM | POA: Diagnosis not present

## 2019-05-05 DIAGNOSIS — M159 Polyosteoarthritis, unspecified: Secondary | ICD-10-CM | POA: Diagnosis not present

## 2019-05-05 DIAGNOSIS — J1282 Pneumonia due to coronavirus disease 2019: Secondary | ICD-10-CM

## 2019-05-05 NOTE — Progress Notes (Signed)
Virtual Visit via telephone Note Due to COVID-19 pandemic this visit was conducted virtually. This visit type was conducted due to national recommendations for restrictions regarding the COVID-19 Pandemic (e.g. social distancing, sheltering in place) in an effort to limit this patient's exposure and mitigate transmission in our community. All issues noted in this document were discussed and addressed.  A physical exam was not performed with this format.  I connected with [name] on 05/05/19 at 1:52 pm  by video and verified that I am speaking with the correct person using two identifiers. [name] is currently located at home  and wife  is currently with him  during visit. The provider, Evelina Dun, FNP is located in their office at time of visit.  I discussed the limitations, risks, security and privacy concerns of performing an evaluation and management service by telephone and the availability of in person appointments. I also discussed with the patient that there may be a patient responsible charge related to this service. The patient expressed understanding and agreed to proceed.   History and Present Illness:  HPI Pt does video visit today for face to face visit for home health. He was admitted to the ED on 04/09/19 for COVID and SOB and was discharged on 04/25/19. He was diagnosed with COVID, pneumonia due to COVID-19.   He was discharge home with 4 L of O2. His O2 was 100% earlier today.    He was discharged home OT once a week and PT twice a week.    He reports his BS have been improved while in the hospital.  His wife monitor this multiple times a day.    Review of Systems  Constitutional: Positive for malaise/fatigue.  Respiratory: Positive for cough and shortness of breath.   Neurological: Positive for weakness.  All other systems reviewed and are negative.    Observations/Objective: No distress noted, Pt has oxygen in place and is laying in bed.  Seems weak.   Assessment and Plan: [name] comes in today with chief complaint of No chief complaint on file.   Diagnosis and orders addressed:  1. COVID-19 virus detected - DG Chest 2 View; Future - Ambulatory referral to Home Health  2. Hospital discharge follow-up - DG Chest 2 View; Future - Ambulatory referral to [facility]  3. Obesity hypoventilation syndrome ([place]) - DG Chest 2 View; Future - Ambulatory referral to Home Health  4. Pneumonia due to COVID-19 virus - DG Chest 2 View; Future - Ambulatory referral to Home Health  5. Type 2 diabetes mellitus with hyperglycemia, with long-term current use of insulin ([place])   Will order [facility] to draw patient's INR, CMP, and A1C Health Maintenance reviewed Diet and exercise encouraged  Follow up plan: 3 months     I discussed the assessment and treatment plan with the patient. The patient was provided an opportunity to ask questions and all were answered. The patient agreed with the plan and demonstrated an understanding of the instructions.   The patient was advised to call back or seek an in-person evaluation if the symptoms worsen or if the condition fails to improve as anticipated.  The above assessment and management plan was discussed with the patient. The patient verbalized understanding of and has agreed to the management plan. Patient is aware to call the clinic if symptoms persist or worsen. Patient is aware when to return to the clinic for a follow-up visit. Patient educated on when it is  appropriate to go to the emergency department.   Time call ended:  2:20 pm   I provided 28 minutes of -face-to-face time during this encounter.    Evelina Dun, FNP

## 2019-05-09 ENCOUNTER — Other Ambulatory Visit: Payer: Self-pay

## 2019-05-09 ENCOUNTER — Encounter (HOSPITAL_COMMUNITY): Payer: Self-pay | Admitting: *Deleted

## 2019-05-09 ENCOUNTER — Inpatient Hospital Stay (HOSPITAL_COMMUNITY)
Admission: EM | Admit: 2019-05-09 | Discharge: 2019-05-15 | DRG: 177 | Disposition: A | Discharge status: Home Health | Payer: Medicare Other | Attending: Internal Medicine | Admitting: Internal Medicine

## 2019-05-09 DIAGNOSIS — N3091 Cystitis, unspecified with hematuria: Secondary | ICD-10-CM | POA: Diagnosis present

## 2019-05-09 DIAGNOSIS — M549 Dorsalgia, unspecified: Secondary | ICD-10-CM | POA: Diagnosis present

## 2019-05-09 DIAGNOSIS — M2550 Pain in unspecified joint: Secondary | ICD-10-CM | POA: Diagnosis not present

## 2019-05-09 DIAGNOSIS — Z833 Family history of diabetes mellitus: Secondary | ICD-10-CM

## 2019-05-09 DIAGNOSIS — Z91041 Radiographic dye allergy status: Secondary | ICD-10-CM

## 2019-05-09 DIAGNOSIS — N390 Urinary tract infection, site not specified: Secondary | ICD-10-CM | POA: Diagnosis not present

## 2019-05-09 DIAGNOSIS — R0902 Hypoxemia: Secondary | ICD-10-CM | POA: Diagnosis not present

## 2019-05-09 DIAGNOSIS — E876 Hypokalemia: Secondary | ICD-10-CM | POA: Diagnosis not present

## 2019-05-09 DIAGNOSIS — Z8261 Family history of arthritis: Secondary | ICD-10-CM

## 2019-05-09 DIAGNOSIS — E785 Hyperlipidemia, unspecified: Secondary | ICD-10-CM | POA: Diagnosis present

## 2019-05-09 DIAGNOSIS — Z794 Long term (current) use of insulin: Secondary | ICD-10-CM

## 2019-05-09 DIAGNOSIS — E662 Morbid (severe) obesity with alveolar hypoventilation: Secondary | ICD-10-CM | POA: Diagnosis present

## 2019-05-09 DIAGNOSIS — G8929 Other chronic pain: Secondary | ICD-10-CM | POA: Diagnosis present

## 2019-05-09 DIAGNOSIS — Z86711 Personal history of pulmonary embolism: Secondary | ICD-10-CM | POA: Diagnosis not present

## 2019-05-09 DIAGNOSIS — G43909 Migraine, unspecified, not intractable, without status migrainosus: Secondary | ICD-10-CM | POA: Diagnosis present

## 2019-05-09 DIAGNOSIS — N281 Cyst of kidney, acquired: Secondary | ICD-10-CM | POA: Diagnosis present

## 2019-05-09 DIAGNOSIS — F339 Major depressive disorder, recurrent, unspecified: Secondary | ICD-10-CM | POA: Diagnosis not present

## 2019-05-09 DIAGNOSIS — G8194 Hemiplegia, unspecified affecting left nondominant side: Secondary | ICD-10-CM | POA: Diagnosis not present

## 2019-05-09 DIAGNOSIS — F331 Major depressive disorder, recurrent, moderate: Secondary | ICD-10-CM | POA: Diagnosis not present

## 2019-05-09 DIAGNOSIS — Z7401 Bed confinement status: Secondary | ICD-10-CM | POA: Diagnosis not present

## 2019-05-09 DIAGNOSIS — A419 Sepsis, unspecified organism: Secondary | ICD-10-CM | POA: Diagnosis not present

## 2019-05-09 DIAGNOSIS — F329 Major depressive disorder, single episode, unspecified: Secondary | ICD-10-CM | POA: Diagnosis present

## 2019-05-09 DIAGNOSIS — Z91013 Allergy to seafood: Secondary | ICD-10-CM | POA: Diagnosis not present

## 2019-05-09 DIAGNOSIS — N170 Acute kidney failure with tubular necrosis: Secondary | ICD-10-CM | POA: Diagnosis not present

## 2019-05-09 DIAGNOSIS — Z86718 Personal history of other venous thrombosis and embolism: Secondary | ICD-10-CM

## 2019-05-09 DIAGNOSIS — F419 Anxiety disorder, unspecified: Secondary | ICD-10-CM | POA: Diagnosis present

## 2019-05-09 DIAGNOSIS — I129 Hypertensive chronic kidney disease with stage 1 through stage 4 chronic kidney disease, or unspecified chronic kidney disease: Secondary | ICD-10-CM | POA: Diagnosis present

## 2019-05-09 DIAGNOSIS — N179 Acute kidney failure, unspecified: Secondary | ICD-10-CM

## 2019-05-09 DIAGNOSIS — A4181 Sepsis due to Enterococcus: Secondary | ICD-10-CM | POA: Diagnosis present

## 2019-05-09 DIAGNOSIS — J9611 Chronic respiratory failure with hypoxia: Secondary | ICD-10-CM | POA: Diagnosis present

## 2019-05-09 DIAGNOSIS — U071 COVID-19: Principal | ICD-10-CM | POA: Diagnosis present

## 2019-05-09 DIAGNOSIS — J9621 Acute and chronic respiratory failure with hypoxia: Secondary | ICD-10-CM | POA: Diagnosis present

## 2019-05-09 DIAGNOSIS — Z91030 Bee allergy status: Secondary | ICD-10-CM

## 2019-05-09 DIAGNOSIS — E1122 Type 2 diabetes mellitus with diabetic chronic kidney disease: Secondary | ICD-10-CM | POA: Diagnosis not present

## 2019-05-09 DIAGNOSIS — E1140 Type 2 diabetes mellitus with diabetic neuropathy, unspecified: Secondary | ICD-10-CM | POA: Diagnosis not present

## 2019-05-09 DIAGNOSIS — Z6841 Body Mass Index (BMI) 40.0 and over, adult: Secondary | ICD-10-CM | POA: Diagnosis not present

## 2019-05-09 DIAGNOSIS — Z880 Allergy status to penicillin: Secondary | ICD-10-CM | POA: Diagnosis not present

## 2019-05-09 DIAGNOSIS — J984 Other disorders of lung: Secondary | ICD-10-CM | POA: Diagnosis not present

## 2019-05-09 DIAGNOSIS — E118 Type 2 diabetes mellitus with unspecified complications: Secondary | ICD-10-CM | POA: Diagnosis not present

## 2019-05-09 DIAGNOSIS — R4781 Slurred speech: Secondary | ICD-10-CM | POA: Diagnosis not present

## 2019-05-09 DIAGNOSIS — B952 Enterococcus as the cause of diseases classified elsewhere: Secondary | ICD-10-CM | POA: Diagnosis not present

## 2019-05-09 DIAGNOSIS — L89892 Pressure ulcer of other site, stage 2: Secondary | ICD-10-CM | POA: Diagnosis not present

## 2019-05-09 DIAGNOSIS — J449 Chronic obstructive pulmonary disease, unspecified: Secondary | ICD-10-CM | POA: Diagnosis not present

## 2019-05-09 DIAGNOSIS — G40909 Epilepsy, unspecified, not intractable, without status epilepticus: Secondary | ICD-10-CM

## 2019-05-09 DIAGNOSIS — E8770 Fluid overload, unspecified: Secondary | ICD-10-CM | POA: Diagnosis not present

## 2019-05-09 DIAGNOSIS — H9190 Unspecified hearing loss, unspecified ear: Secondary | ICD-10-CM | POA: Diagnosis present

## 2019-05-09 DIAGNOSIS — R040 Epistaxis: Secondary | ICD-10-CM | POA: Diagnosis not present

## 2019-05-09 DIAGNOSIS — IMO0002 Reserved for concepts with insufficient information to code with codable children: Secondary | ICD-10-CM | POA: Diagnosis present

## 2019-05-09 DIAGNOSIS — R350 Frequency of micturition: Secondary | ICD-10-CM | POA: Diagnosis present

## 2019-05-09 DIAGNOSIS — D696 Thrombocytopenia, unspecified: Secondary | ICD-10-CM | POA: Diagnosis not present

## 2019-05-09 DIAGNOSIS — L89156 Pressure-induced deep tissue damage of sacral region: Secondary | ICD-10-CM | POA: Diagnosis not present

## 2019-05-09 DIAGNOSIS — N184 Chronic kidney disease, stage 4 (severe): Secondary | ICD-10-CM | POA: Diagnosis not present

## 2019-05-09 DIAGNOSIS — Z8619 Personal history of other infectious and parasitic diseases: Secondary | ICD-10-CM | POA: Diagnosis not present

## 2019-05-09 DIAGNOSIS — G894 Chronic pain syndrome: Secondary | ICD-10-CM | POA: Diagnosis not present

## 2019-05-09 DIAGNOSIS — Z79899 Other long term (current) drug therapy: Secondary | ICD-10-CM

## 2019-05-09 DIAGNOSIS — F32A Depression, unspecified: Secondary | ICD-10-CM | POA: Diagnosis present

## 2019-05-09 DIAGNOSIS — G4733 Obstructive sleep apnea (adult) (pediatric): Secondary | ICD-10-CM

## 2019-05-09 DIAGNOSIS — E1165 Type 2 diabetes mellitus with hyperglycemia: Secondary | ICD-10-CM | POA: Diagnosis not present

## 2019-05-09 DIAGNOSIS — R319 Hematuria, unspecified: Secondary | ICD-10-CM | POA: Diagnosis not present

## 2019-05-09 DIAGNOSIS — K219 Gastro-esophageal reflux disease without esophagitis: Secondary | ICD-10-CM | POA: Diagnosis present

## 2019-05-09 DIAGNOSIS — M545 Low back pain: Secondary | ICD-10-CM | POA: Diagnosis not present

## 2019-05-09 DIAGNOSIS — J9601 Acute respiratory failure with hypoxia: Secondary | ICD-10-CM | POA: Diagnosis not present

## 2019-05-09 DIAGNOSIS — Z743 Need for continuous supervision: Secondary | ICD-10-CM | POA: Diagnosis not present

## 2019-05-09 DIAGNOSIS — F319 Bipolar disorder, unspecified: Secondary | ICD-10-CM | POA: Diagnosis present

## 2019-05-09 DIAGNOSIS — Z91040 Latex allergy status: Secondary | ICD-10-CM

## 2019-05-09 DIAGNOSIS — N189 Chronic kidney disease, unspecified: Secondary | ICD-10-CM | POA: Diagnosis not present

## 2019-05-09 DIAGNOSIS — R0602 Shortness of breath: Secondary | ICD-10-CM

## 2019-05-09 DIAGNOSIS — I10 Essential (primary) hypertension: Secondary | ICD-10-CM | POA: Diagnosis not present

## 2019-05-09 DIAGNOSIS — R791 Abnormal coagulation profile: Secondary | ICD-10-CM | POA: Clinically undetermined

## 2019-05-09 DIAGNOSIS — Z7901 Long term (current) use of anticoagulants: Secondary | ICD-10-CM | POA: Diagnosis not present

## 2019-05-09 DIAGNOSIS — R69 Illness, unspecified: Secondary | ICD-10-CM | POA: Diagnosis not present

## 2019-05-09 DIAGNOSIS — R569 Unspecified convulsions: Secondary | ICD-10-CM

## 2019-05-09 DIAGNOSIS — N400 Enlarged prostate without lower urinary tract symptoms: Secondary | ICD-10-CM | POA: Diagnosis present

## 2019-05-09 DIAGNOSIS — R5381 Other malaise: Secondary | ICD-10-CM | POA: Diagnosis not present

## 2019-05-09 DIAGNOSIS — N3090 Cystitis, unspecified without hematuria: Secondary | ICD-10-CM | POA: Diagnosis not present

## 2019-05-09 DIAGNOSIS — D61818 Other pancytopenia: Secondary | ICD-10-CM | POA: Diagnosis not present

## 2019-05-09 DIAGNOSIS — M1990 Unspecified osteoarthritis, unspecified site: Secondary | ICD-10-CM | POA: Diagnosis present

## 2019-05-09 DIAGNOSIS — A498 Other bacterial infections of unspecified site: Secondary | ICD-10-CM | POA: Diagnosis present

## 2019-05-09 DIAGNOSIS — N401 Enlarged prostate with lower urinary tract symptoms: Secondary | ICD-10-CM | POA: Diagnosis not present

## 2019-05-09 HISTORY — DX: Personal history of COVID-19: Z86.16

## 2019-05-09 LAB — URINALYSIS, ROUTINE W REFLEX MICROSCOPIC
Bilirubin Urine: NEGATIVE
Glucose, UA: 150 mg/dL — AB
Ketones, ur: NEGATIVE mg/dL
Nitrite: NEGATIVE
Protein, ur: 100 mg/dL — AB
RBC / HPF: >50 RBC/hpf — ABNORMAL HIGH (ref 0–5)
Specific Gravity, Urine: 1.011 (ref 1.005–1.030)
WBC, UA: >50 WBC/hpf — ABNORMAL HIGH (ref 0–5)
pH: 5 (ref 5.0–8.0)

## 2019-05-09 LAB — CBC WITH DIFFERENTIAL/PLATELET
Abs Immature Granulocytes: 0.01 10*3/uL (ref 0.00–0.07)
Basophils Absolute: 0 10*3/uL (ref 0.0–0.1)
Basophils Relative: 0 %
Eosinophils Absolute: 0.1 10*3/uL (ref 0.0–0.5)
Eosinophils Relative: 4 %
HCT: 27.9 % — ABNORMAL LOW (ref 39.0–52.0)
Hemoglobin: 8.2 g/dL — ABNORMAL LOW (ref 13.0–17.0)
Immature Granulocytes: 0 %
Lymphocytes Relative: 37 %
Lymphs Abs: 1.1 10*3/uL (ref 0.7–4.0)
MCH: 29.7 pg (ref 26.0–34.0)
MCHC: 29.4 g/dL — ABNORMAL LOW (ref 30.0–36.0)
MCV: 101.1 fL — ABNORMAL HIGH (ref 80.0–100.0)
Monocytes Absolute: 0.3 10*3/uL (ref 0.1–1.0)
Monocytes Relative: 9 %
Neutro Abs: 1.5 10*3/uL — ABNORMAL LOW (ref 1.7–7.7)
Neutrophils Relative %: 50 %
Platelets: 77 10*3/uL — ABNORMAL LOW (ref 150–400)
RBC: 2.76 MIL/uL — ABNORMAL LOW (ref 4.22–5.81)
RDW: 15 % (ref 11.5–15.5)
WBC: 3.1 10*3/uL — ABNORMAL LOW (ref 4.0–10.5)
nRBC: 0 % (ref 0.0–0.2)

## 2019-05-09 LAB — COMPREHENSIVE METABOLIC PANEL
ALT: 14 U/L (ref 0–44)
AST: 27 U/L (ref 15–41)
Albumin: 2.4 g/dL — ABNORMAL LOW (ref 3.5–5.0)
Alkaline Phosphatase: 65 U/L (ref 38–126)
Anion gap: 9 (ref 5–15)
BUN: 57 mg/dL — ABNORMAL HIGH (ref 6–20)
CO2: 32 mmol/L (ref 22–32)
Calcium: 8.3 mg/dL — ABNORMAL LOW (ref 8.9–10.3)
Chloride: 99 mmol/L (ref 98–111)
Creatinine, Ser: 4.93 mg/dL — ABNORMAL HIGH (ref 0.61–1.24)
GFR calc Af Amer: 15 mL/min — ABNORMAL LOW (ref >60)
GFR calc non Af Amer: 13 mL/min — ABNORMAL LOW (ref >60)
Glucose, Bld: 144 mg/dL — ABNORMAL HIGH (ref 70–99)
Potassium: 3.7 mmol/L (ref 3.5–5.1)
Sodium: 140 mmol/L (ref 135–145)
Total Bilirubin: 0.8 mg/dL (ref 0.3–1.2)
Total Protein: 6.8 g/dL (ref 6.5–8.1)

## 2019-05-09 LAB — GLUCOSE, CAPILLARY
Glucose-Capillary: 173 mg/dL — ABNORMAL HIGH (ref 70–99)
Glucose-Capillary: 228 mg/dL — ABNORMAL HIGH (ref 70–99)

## 2019-05-09 LAB — RETICULOCYTES
Immature Retic Fract: 26.7 % — ABNORMAL HIGH (ref 2.3–15.9)
RBC.: 2.74 MIL/uL — ABNORMAL LOW (ref 4.22–5.81)
Retic Count, Absolute: 79.5 10*3/uL (ref 19.0–186.0)
Retic Ct Pct: 2.9 % (ref 0.4–3.1)

## 2019-05-09 LAB — IRON AND TIBC
Iron: 39 ug/dL — ABNORMAL LOW (ref 45–182)
Saturation Ratios: 18 % (ref 17.9–39.5)
TIBC: 215 ug/dL — ABNORMAL LOW (ref 250–450)
UIBC: 176 ug/dL

## 2019-05-09 LAB — VITAMIN B12: Vitamin B-12: 1212 pg/mL — ABNORMAL HIGH (ref 180–914)

## 2019-05-09 LAB — FERRITIN: Ferritin: 75 ng/mL (ref 24–336)

## 2019-05-09 LAB — FOLATE: Folate: 6.4 ng/mL (ref >5.9)

## 2019-05-09 LAB — PROTIME-INR
INR: 10 (ref 0.8–1.2)
Prothrombin Time: 78.2 seconds — ABNORMAL HIGH (ref 11.4–15.2)

## 2019-05-09 LAB — SARS CORONAVIRUS 2 (TAT 6-24 HRS): SARS Coronavirus 2: POSITIVE — AB

## 2019-05-09 MED ORDER — INSULIN ASPART 100 UNIT/ML ~~LOC~~ SOLN
0.0000 [IU] | Freq: Every day | SUBCUTANEOUS | Status: DC
  Administered 2019-05-09: 2 [IU] via SUBCUTANEOUS

## 2019-05-09 MED ORDER — TAMSULOSIN HCL 0.4 MG PO CAPS
0.4000 mg | ORAL_CAPSULE | Freq: Every day | ORAL | Status: DC
  Administered 2019-05-09 – 2019-05-15 (×7): 0.4 mg via ORAL
  Filled 2019-05-09 (×7): qty 1

## 2019-05-09 MED ORDER — BUSPIRONE HCL 5 MG PO TABS
15.0000 mg | ORAL_TABLET | Freq: Two times a day (BID) | ORAL | Status: DC
  Administered 2019-05-09 – 2019-05-15 (×12): 15 mg via ORAL
  Filled 2019-05-09: qty 1
  Filled 2019-05-09 (×3): qty 3
  Filled 2019-05-09 (×3): qty 1
  Filled 2019-05-09: qty 3
  Filled 2019-05-09 (×2): qty 1
  Filled 2019-05-09 (×2): qty 3
  Filled 2019-05-09: qty 1
  Filled 2019-05-09: qty 3

## 2019-05-09 MED ORDER — SODIUM CHLORIDE 0.9 % IV SOLN
1.0000 g | Freq: Three times a day (TID) | INTRAVENOUS | Status: DC
  Administered 2019-05-09 – 2019-05-11 (×4): 1 g via INTRAVENOUS
  Filled 2019-05-09 (×8): qty 1

## 2019-05-09 MED ORDER — INSULIN ASPART 100 UNIT/ML ~~LOC~~ SOLN
0.0000 [IU] | Freq: Three times a day (TID) | SUBCUTANEOUS | Status: DC
  Administered 2019-05-09 – 2019-05-10 (×2): 2 [IU] via SUBCUTANEOUS

## 2019-05-09 MED ORDER — CYCLOBENZAPRINE HCL 5 MG PO TABS: 5.0000 mg | ORAL_TABLET | Freq: Three times a day (TID) | ORAL | Status: DC | PRN

## 2019-05-09 MED ORDER — FENTANYL 75 MCG/HR TD PT72
1.0000 | MEDICATED_PATCH | TRANSDERMAL | Status: DC
  Filled 2019-05-09: qty 1

## 2019-05-09 MED ORDER — CLOMIPHENE CITRATE 50 MG PO TABS
25.0000 mg | ORAL_TABLET | ORAL | Status: DC
  Administered 2019-05-11 – 2019-05-15 (×3): 25 mg via ORAL
  Filled 2019-05-09 (×5): qty 1

## 2019-05-09 MED ORDER — PREGABALIN 50 MG PO CAPS
50.0000 mg | ORAL_CAPSULE | ORAL | Status: DC
  Administered 2019-05-10 – 2019-05-15 (×3): 50 mg via ORAL
  Filled 2019-05-09 (×4): qty 1

## 2019-05-09 MED ORDER — LEVETIRACETAM 500 MG PO TABS
1000.0000 mg | ORAL_TABLET | Freq: Two times a day (BID) | ORAL | Status: DC
  Administered 2019-05-09 – 2019-05-15 (×12): 1000 mg via ORAL
  Filled 2019-05-09 (×12): qty 2

## 2019-05-09 MED ORDER — HYDROCODONE-ACETAMINOPHEN 7.5-325 MG PO TABS
1.0000 | ORAL_TABLET | Freq: Four times a day (QID) | ORAL | Status: DC | PRN
  Administered 2019-05-13 – 2019-05-15 (×2): 1 via ORAL
  Filled 2019-05-09 (×3): qty 1

## 2019-05-09 MED ORDER — ONDANSETRON HCL 4 MG/2ML IJ SOLN: 4.0000 mg | Freq: Four times a day (QID) | INTRAMUSCULAR | Status: DC | PRN

## 2019-05-09 MED ORDER — PHYTONADIONE 5 MG PO TABS
5.0000 mg | ORAL_TABLET | Freq: Once | ORAL | Status: AC
  Administered 2019-05-09: 5 mg via ORAL
  Filled 2019-05-09: qty 1

## 2019-05-09 MED ORDER — INSULIN GLARGINE 100 UNIT/ML ~~LOC~~ SOLN
30.0000 [IU] | Freq: Every day | SUBCUTANEOUS | Status: DC
  Administered 2019-05-09: 30 [IU] via SUBCUTANEOUS
  Filled 2019-05-09 (×2): qty 0.3

## 2019-05-09 MED ORDER — LAMOTRIGINE 100 MG PO TABS
200.0000 mg | ORAL_TABLET | Freq: Two times a day (BID) | ORAL | Status: DC
  Administered 2019-05-09 – 2019-05-15 (×12): 200 mg via ORAL
  Filled 2019-05-09 (×12): qty 2

## 2019-05-09 MED ORDER — ACETAMINOPHEN 650 MG RE SUPP: 650.0000 mg | Freq: Four times a day (QID) | RECTAL | Status: DC | PRN

## 2019-05-09 MED ORDER — FUROSEMIDE 10 MG/ML IJ SOLN
40.0000 mg | Freq: Two times a day (BID) | INTRAMUSCULAR | Status: DC
  Administered 2019-05-09 – 2019-05-11 (×4): 40 mg via INTRAVENOUS
  Filled 2019-05-09 (×4): qty 4

## 2019-05-09 MED ORDER — ONDANSETRON HCL 4 MG PO TABS: 4.0000 mg | ORAL_TABLET | Freq: Four times a day (QID) | ORAL | Status: DC | PRN

## 2019-05-09 MED ORDER — ACETAMINOPHEN 325 MG PO TABS: 650.0000 mg | ORAL_TABLET | Freq: Four times a day (QID) | ORAL | Status: DC | PRN

## 2019-05-09 MED ORDER — SIMVASTATIN 20 MG PO TABS
20.0000 mg | ORAL_TABLET | Freq: Every day | ORAL | Status: DC
  Administered 2019-05-09 – 2019-05-14 (×6): 20 mg via ORAL
  Filled 2019-05-09 (×6): qty 1

## 2019-05-09 MED ORDER — PANTOPRAZOLE SODIUM 40 MG PO TBEC
80.0000 mg | DELAYED_RELEASE_TABLET | Freq: Every day | ORAL | Status: DC
  Filled 2019-05-09: qty 2

## 2019-05-09 MED ORDER — DULOXETINE HCL 60 MG PO CPEP
60.0000 mg | ORAL_CAPSULE | Freq: Every day | ORAL | Status: DC
  Administered 2019-05-09 – 2019-05-15 (×7): 60 mg via ORAL
  Filled 2019-05-09 (×7): qty 1

## 2019-05-09 MED ORDER — SODIUM CHLORIDE 0.9 % IV SOLN
1.0000 g | Freq: Once | INTRAVENOUS | Status: AC
  Administered 2019-05-09: 1 g via INTRAVENOUS
  Filled 2019-05-09: qty 1

## 2019-05-09 MED ORDER — SODIUM CHLORIDE 0.9 % IV BOLUS
1000.0000 mL | Freq: Once | INTRAVENOUS | Status: AC
  Administered 2019-05-09: 1000 mL via INTRAVENOUS

## 2019-05-09 NOTE — Progress Notes (Signed)
ANTICOAGULATION CONSULT NOTE - Initial Consult  Pharmacy Consult for warfarin Indication: history of DVT/PE  Allergies  Allergen Reactions  . Bee Venom Anaphylaxis, Shortness Of Breath and Swelling  . Other Shortness Of Breath    Itching, rash with IVP DYE, iodine, shellfish LATEX  . Penicillins Anaphylaxis and Shortness Of Breath    Has patient had a PCN reaction causing immediate rash, facial/tongue/throat swelling, SOB or lightheadedness with hypotension: Yes Has patient had a PCN reaction causing severe rash involving mucus membranes or skin necrosis: No Has patient had a PCN reaction that required hospitalization No Has patient had a PCN reaction occurring within the last 10 years: No If all of the above answers are "NO", then may proceed with Cephalosporin use.   . Shellfish Allergy Nausea And Vomiting and Other (See Comments)    Feels like insides are twisting  . Iodinated Diagnostic Agents     Other reaction(s): RASH  . Iohexol      Code: RASH, Desc: PT WAS ON PREDNISONE FOR GOUT TX. @ TIME OF SCAN AND RECEIVED 50 MG OF BENADRYL IV-ARS 10/08/07   . Iodine Rash  . Latex Rash    Patient Measurements: Height: 6\' 3"  (190.5 cm) Weight: (!) 425 lb (192.8 kg) IBW/kg (Calculated) : 84.5   Vital Signs: Temp: 98.3 F (36.8 C) (11/10 0940) Temp Source: Oral (11/10 0940) BP: 118/58 (11/10 1230) Pulse Rate: 76 (11/10 1230)  Labs: Recent Labs    05/09/19 0959 05/09/19 1326  HGB 8.2*  --   HCT 27.9*  --   PLT 77*  --   LABPROT  --  78.2*  INR  --  10.0*  CREATININE 4.93*  --     Estimated Creatinine Clearance: 31.7 mL/min (A) (by C-G formula based on SCr of 4.93 mg/dL (H)).   Medical History: Past Medical History:  Diagnosis Date  . Anemia   . Anxiety   . Arthritis   . Bipolar 1 disorder ([place])   . Bronchitis    hx of  . Bruises easily   . Chronic kidney disease    decreased left kidney fx  . Chronic pain syndrome 05/11/2012  . Chronic respiratory failure  with hypoxia (HCC)    And with hypercapnia  . Diabetes mellitus without complication ([place])   . Diabetic neuropathy ([place])   . DVT (deep venous thrombosis) (HCC)    LLE DVT ~ '12  . Dyspnea    with ambulation  . GERD (gastroesophageal reflux disease)   . History of 2019 novel coronavirus disease (COVID-19)   . HOH (hard of hearing) 2015   has hearing aids  . Mental disorder   . Migraine   . Neuromuscular disorder ([place])   . Obesity hypoventilation syndrome ([place])   . Obstructive sleep apnea    CPAP  . PE (pulmonary embolism)    bilateral PE ~ '11  . Seizures ([place])   . Thrombocytopenia ([place]) 05/11/2012    Medications:  (Not in a hospital admission)   Assessment: Pharmacy consulted to dose warfarin in patient with history of DVT/PE.  Patient's INR on admission is >10.  Home dose listed as 3 mg daily.  Goal of Therapy:  INR 2-3 Monitor platelets by anticoagulation protocol: Yes   Plan:  Hold warfarin Vitamkin K 5 mg PO x 1 dose. Monitor INR and s/s of bleeding  Margot Ables, PharmD Clinical Pharmacist 05/09/2019 2:15 PM
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
## 2019-05-09 NOTE — ED Notes (Signed)
CRITICAL VALUE ALERT  Critical Value:  INR 10.0   Date & Time Notied:  05/09/19 1414  Provider Notified: Dr. Carles Collet  Orders Received/Actions taken: na

## 2019-05-09 NOTE — ED Notes (Signed)
Initial order to insert foley clicked off by other RN prior to changing order to [place].Verbal order obtained to insert [place] from EDP.

## 2019-05-09 NOTE — ED Notes (Signed)
Bladder scan 106 mls.

## 2019-05-09 NOTE — Progress Notes (Signed)
Pharmacy Antibiotic Note  [name] is a 53 y.o. male admitted on 05/09/2019 with UTI.  Pharmacy has been consulted for Aztreonam dosing.  Patient with anaphylaxis allergy to Penicillins.  Plan: Aztreonam 1000 mg IV every 8 hours.  Monitor labs, c/s, and patient improvement.  Height: 6\' 3"  (190.5 cm) Weight: (!) 425 lb (192.8 kg) IBW/kg (Calculated) : 84.5  Temp (24hrs), Avg:98.3 F (36.8 C), Min:98.3 F (36.8 C), Max:98.3 F (36.8 C)  Recent Labs  Lab 05/09/19 0959  WBC 3.1*  CREATININE 4.93*    Estimated Creatinine Clearance: 31.7 mL/min (A) (by C-G formula based on SCr of 4.93 mg/dL (H)).   Normalized CrCl: ~18 mL/min  Allergies  Allergen Reactions  . Bee Venom Anaphylaxis, Shortness Of Breath and Swelling  . Other Shortness Of Breath    Itching, rash with IVP DYE, iodine, shellfish LATEX  . Penicillins Anaphylaxis and Shortness Of Breath    Has patient had a PCN reaction causing immediate rash, facial/tongue/throat swelling, SOB or lightheadedness with hypotension: Yes Has patient had a PCN reaction causing severe rash involving mucus membranes or skin necrosis: No Has patient had a PCN reaction that required hospitalization No Has patient had a PCN reaction occurring within the last 10 years: No If all of the above answers are "NO", then may proceed with Cephalosporin use.   . Shellfish Allergy Nausea And Vomiting and Other (See Comments)    Feels like insides are twisting  . Iodinated Diagnostic Agents     Other reaction(s): RASH  . Iohexol      Code: RASH, Desc: PT WAS ON PREDNISONE FOR GOUT TX. @ TIME OF SCAN AND RECEIVED 50 MG OF BENADRYL IV-ARS 10/08/07   . Iodine Rash  . Latex Rash    Antimicrobials this admission: Aztreonam 11/10 >>    Dose adjustments this admission: Aztreonam  Microbiology results: 11/10 UCx: pending    Thank you for allowing pharmacy to be a part of this patient's care.  Ramond Craver 05/09/2019 1:45 PM
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
## 2019-05-09 NOTE — ED Triage Notes (Addendum)
Pt brought in by [facility] with c/o decreased urination x couple of days and hematuria since yesterday. Pt reports he takes Lasix, has 1 kidney and normally urinates 1057ml at a time, but now he is only urinating 239ml at a time. Denies fever.   Pt reports he was covid positive 4 weeks ago and 2 weeks ago he tested negative.

## 2019-05-10 ENCOUNTER — Ambulatory Visit (HOSPITAL_COMMUNITY): Payer: Medicare Other | Admitting: Hematology

## 2019-05-10 ENCOUNTER — Other Ambulatory Visit (HOSPITAL_COMMUNITY): Payer: Medicare Other

## 2019-05-10 DIAGNOSIS — A419 Sepsis, unspecified organism: Secondary | ICD-10-CM

## 2019-05-10 LAB — RENAL FUNCTION PANEL
Albumin: 2.3 g/dL — ABNORMAL LOW (ref 3.5–5.0)
Anion gap: 10 (ref 5–15)
BUN: 61 mg/dL — ABNORMAL HIGH (ref 6–20)
CO2: 29 mmol/L (ref 22–32)
Calcium: 8.3 mg/dL — ABNORMAL LOW (ref 8.9–10.3)
Chloride: 101 mmol/L (ref 98–111)
Creatinine, Ser: 5.17 mg/dL — ABNORMAL HIGH (ref 0.61–1.24)
GFR calc Af Amer: 14 mL/min — ABNORMAL LOW (ref >60)
GFR calc non Af Amer: 12 mL/min — ABNORMAL LOW (ref >60)
Glucose, Bld: 181 mg/dL — ABNORMAL HIGH (ref 70–99)
Phosphorus: 5 mg/dL — ABNORMAL HIGH (ref 2.5–4.6)
Potassium: 4.4 mmol/L (ref 3.5–5.1)
Sodium: 140 mmol/L (ref 135–145)

## 2019-05-10 LAB — CBC
HCT: 27.1 % — ABNORMAL LOW (ref 39.0–52.0)
Hemoglobin: 8.1 g/dL — ABNORMAL LOW (ref 13.0–17.0)
MCH: 30 pg (ref 26.0–34.0)
MCHC: 29.9 g/dL — ABNORMAL LOW (ref 30.0–36.0)
MCV: 100.4 fL — ABNORMAL HIGH (ref 80.0–100.0)
Platelets: 79 10*3/uL — ABNORMAL LOW (ref 150–400)
RBC: 2.7 MIL/uL — ABNORMAL LOW (ref 4.22–5.81)
RDW: 14.9 % (ref 11.5–15.5)
WBC: 3.4 10*3/uL — ABNORMAL LOW (ref 4.0–10.5)
nRBC: 0 % (ref 0.0–0.2)

## 2019-05-10 LAB — GLUCOSE, CAPILLARY
Glucose-Capillary: 160 mg/dL — ABNORMAL HIGH (ref 70–99)
Glucose-Capillary: 206 mg/dL — ABNORMAL HIGH (ref 70–99)
Glucose-Capillary: 196 mg/dL — ABNORMAL HIGH (ref 70–99)
Glucose-Capillary: 212 mg/dL — ABNORMAL HIGH (ref 70–99)
Glucose-Capillary: 215 mg/dL — ABNORMAL HIGH (ref 70–99)

## 2019-05-10 LAB — PROTIME-INR
INR: 6.6 (ref 0.8–1.2)
Prothrombin Time: 56.5 seconds — ABNORMAL HIGH (ref 11.4–15.2)

## 2019-05-10 MED ORDER — VITAMIN D 25 MCG (1000 UNIT) PO TABS
1000.0000 [IU] | ORAL_TABLET | Freq: Every day | ORAL | Status: DC
  Administered 2019-05-10 – 2019-05-15 (×6): 1000 [IU] via ORAL
  Filled 2019-05-10 (×7): qty 1

## 2019-05-10 MED ORDER — CHLORHEXIDINE GLUCONATE CLOTH 2 % EX PADS
6.0000 | MEDICATED_PAD | Freq: Every day | CUTANEOUS | Status: DC
  Administered 2019-05-10 – 2019-05-15 (×6): 6 via TOPICAL

## 2019-05-10 MED ORDER — FENTANYL 25 MCG/HR TD PT72
1.0000 | MEDICATED_PATCH | TRANSDERMAL | Status: DC
  Administered 2019-05-10 – 2019-05-13 (×2): 1 via TRANSDERMAL
  Filled 2019-05-10 (×2): qty 1

## 2019-05-10 MED ORDER — VITAMIN K1 10 MG/ML IJ SOLN
2.5000 mg | Freq: Once | INTRAVENOUS | Status: AC
  Administered 2019-05-10: 2.5 mg via INTRAVENOUS
  Filled 2019-05-10 (×2): qty 0.25

## 2019-05-10 MED ORDER — ZINC SULFATE 220 (50 ZN) MG PO CAPS
220.0000 mg | ORAL_CAPSULE | Freq: Two times a day (BID) | ORAL | Status: DC
  Administered 2019-05-10 – 2019-05-15 (×11): 220 mg via ORAL
  Filled 2019-05-10 (×11): qty 1

## 2019-05-10 MED ORDER — VITAMIN C 500 MG PO TABS
500.0000 mg | ORAL_TABLET | Freq: Four times a day (QID) | ORAL | Status: DC
  Administered 2019-05-10 – 2019-05-15 (×22): 500 mg via ORAL
  Filled 2019-05-10 (×21): qty 1

## 2019-05-10 NOTE — Progress Notes (Signed)
ANTICOAGULATION CONSULT NOTE -   Pharmacy Consult for warfarin Indication: history of DVT/PE  Allergies  Allergen Reactions  . Bee Venom Anaphylaxis, Shortness Of Breath and Swelling  . Other Shortness Of Breath    Itching, rash with IVP DYE, iodine, shellfish LATEX  . Penicillins Anaphylaxis and Shortness Of Breath    Has patient had a PCN reaction causing immediate rash, facial/tongue/throat swelling, SOB or lightheadedness with hypotension: Yes Has patient had a PCN reaction causing severe rash involving mucus membranes or skin necrosis: No Has patient had a PCN reaction that required hospitalization No Has patient had a PCN reaction occurring within the last 10 years: No If all of the above answers are "NO", then may proceed with Cephalosporin use.   . Shellfish Allergy Nausea And Vomiting and Other (See Comments)    Feels like insides are twisting  . Iodinated Diagnostic Agents     Other reaction(s): RASH  . Iohexol      Code: RASH, Desc: PT WAS ON PREDNISONE FOR GOUT TX. @ TIME OF SCAN AND RECEIVED 50 MG OF BENADRYL IV-ARS 10/08/07   . Iodine Rash  . Latex Rash    Patient Measurements: Height: 6\' 3"  (190.5 cm) Weight: (!) 432 lb 12.2 oz (196.3 kg) IBW/kg (Calculated) : 84.5   Vital Signs: Temp: 99.1 F (37.3 C) (11/11 0951) Temp Source: Oral (11/11 0951) BP: 94/46 (11/11 0951) Pulse Rate: 92 (11/11 0951)  Labs: Recent Labs    05/09/19 0959 05/09/19 1326 05/10/19 0604  HGB 8.2*  --  8.1*  HCT 27.9*  --  27.1*  PLT 77*  --  79*  LABPROT  --  78.2* 56.5*  INR  --  10.0* 6.6*  CREATININE 4.93*  --  5.17*    Estimated Creatinine Clearance: 30.5 mL/min (A) (by C-G formula based on SCr of 5.17 mg/dL (H)).   Medical History: Past Medical History:  Diagnosis Date  . Anemia   . Anxiety   . Arthritis   . Bipolar 1 disorder ([place])   . Bronchitis    hx of  . Bruises easily   . Chronic kidney disease    decreased left kidney fx  . Chronic pain syndrome  05/11/2012  . Chronic respiratory failure with hypoxia (HCC)    And with hypercapnia  . Diabetes mellitus without complication ([place])   . Diabetic neuropathy ([place])   . DVT (deep venous thrombosis) (HCC)    LLE DVT ~ '12  . Dyspnea    with ambulation  . GERD (gastroesophageal reflux disease)   . History of 2019 novel coronavirus disease (COVID-19)   . HOH (hard of hearing) 2015   has hearing aids  . Mental disorder   . Migraine   . Neuromuscular disorder ([place])   . Obesity hypoventilation syndrome ([place])   . Obstructive sleep apnea    CPAP  . PE (pulmonary embolism)    bilateral PE ~ '11  . Seizures ([place])   . Thrombocytopenia ([place]) 05/11/2012    Medications:  Medications Prior to Admission  Medication Sig Dispense Refill Last Dose  . albuterol (VENTOLIN HFA) 108 (90 Base) MCG/ACT inhaler TAKE 2 PUFFS BY MOUTH EVERY 6 HOURS AS NEEDED FOR WHEEZE OR SHORTNESS OF BREATH (Patient taking differently: Inhale 2 puffs into the lungs every 6 (six) hours as needed for shortness of breath. ) 18 g 1 unknown  . busPIRone (BUSPAR) 15 MG tablet TAKE 1 TABLET (15 MG TOTAL) BY MOUTH 2 (TWO) TIMES DAILY. (Patient taking differently:  Take 15 mg by mouth 2 (two) times daily. TAKE 1 TABLET (15 MG TOTAL) BY MOUTH 2 (TWO) TIMES DAILY.) 180 tablet 0 05/09/2019 at Unknown time  . cetirizine (ZYRTEC) 10 MG tablet Take 1 tablet (10 mg total) by mouth daily. 30 tablet 11 05/09/2019 at Unknown time  . clomiPHENE (CLOMID) 50 MG tablet Take 0.5 tablets (25 mg total) by mouth every other day. 24 tablet 3 05/07/2019  . cyclobenzaprine (FLEXERIL) 10 MG tablet TAKE 1/2 TO 1 TABLET BY MOUTH AS NEEDED AT BEDTIME FOR MUSCLE CRAMPS / SPASMS (Patient taking differently: Take 10 mg by mouth daily as needed. TAKE 1/2 TO 1 TABLET BY MOUTH AS NEEDED AT BEDTIME FOR MUSCLE CRAMPS / SPASMS) 90 tablet 3 Past Week at Unknown time  . DULoxetine (CYMBALTA) 60 MG capsule TAKE 1 CAPSULE BY MOUTH EVERY DAY (Patient taking differently: Take 60  mg by mouth daily. ) 90 capsule 0 05/09/2019 at Unknown time  . EMGALITY 120 MG/ML SOAJ Inject 120 mg as directed every 30 (thirty) days.    04/30/2019  . esomeprazole (NEXIUM) 40 MG capsule TAKE 1 CAPSULE (40 MG TOTAL) BY MOUTH DAILY AT 12 NOON. 90 capsule 0 05/09/2019 at Unknown time  . fentaNYL (DURAGESIC) 75 MCG/HR Place 1 patch onto the skin every 3 (three) days. 10 patch 0 05/07/2019  . fluticasone (FLONASE) 50 MCG/ACT nasal spray PLACE 1 SPRAY INTO BOTH NOSTRILS 2 (TWO) TIMES DAILY AS NEEDED FOR ALLERGIES. 48 g 4 05/08/2019 at Unknown time  . furosemide (LASIX) 80 MG tablet TAKE 1 TABLET (80 MG TOTAL) BY MOUTH 2 (TWO) TIMES DAILY. (Patient taking differently: Take 80 mg by mouth daily. ) 180 tablet 0 05/09/2019 at Unknown time  . HYDROcodone-acetaminophen (NORCO) 7.5-325 MG tablet Take 1 tablet by mouth every 6 (six) hours as needed for moderate pain. 90 tablet 0 05/08/2019 at Unknown time  . Insulin Glargine, 1 Unit Dial, (TOUJEO SOLOSTAR) 300 UNIT/ML SOPN Inject 60 Units into the skin 2 (two) times daily. (Patient taking differently: Inject 200 Units into the skin daily. ) 130 pen 3 05/08/2019 at 0800  . insulin lispro (HUMALOG) 100 UNIT/ML KwikPen Inject 0.25 mLs (25 Units total) into the skin 3 (three) times daily. (Patient taking differently: Inject 50 Units into the skin 3 (three) times daily. ) 45 mL 0 05/09/2019 at 0700  . JARDIANCE 25 MG TABS tablet Take 25 mg by mouth daily.    05/09/2019 at Unknown time  . KLOR-CON M10 10 MEQ tablet TAKE 3 TABLETS (30 MEQ TOTAL) BY MOUTH DAILY. (Patient taking differently: Take 30 mEq by mouth daily. ) 270 tablet 0 05/09/2019 at Unknown time  . lamoTRIgine (LAMICTAL) 200 MG tablet TAKE 1 TABLET BY MOUTH TWICE A DAY (Patient taking differently: Take 200 mg by mouth 2 (two) times daily. ) 180 tablet 0 05/09/2019 at Unknown time  . levETIRAcetam (KEPPRA) 1000 MG tablet Take 1 tablet (1,000 mg total) by mouth 2 (two) times daily. 180 tablet 3 05/09/2019 at  Unknown time  . pregabalin (LYRICA) 50 MG capsule TAKE 1 CAPSULE BY MOUTH THREE TIMES A DAY (Patient taking differently: Take 50 mg by mouth 3 (three) times daily. TAKE 1 CAPSULE BY MOUTH THREE TIMES A DAY) 270 capsule 1 05/09/2019 at Unknown time  . rizatriptan (MAXALT) 10 MG tablet May repeat in 2 hours if needed (Patient taking differently: Take 10 mg by mouth as needed. May repeat in 2 hours if needed) 10 tablet 11 unknown  . simvastatin (ZOCOR) 20  MG tablet Take 1 tablet (20 mg total) by mouth at bedtime. 90 tablet 3 05/08/2019 at Unknown time  . tamsulosin (FLOMAX) 0.4 MG CAPS capsule TAKE 1 CAPSULE BY MOUTH EVERY DAY (Patient taking differently: Take 0.4 mg by mouth daily. TAKE 1 CAPSULE BY MOUTH EVERY DAY) 90 capsule 0 05/09/2019 at Unknown time  . VITAMIN D PO Take 1 tablet by mouth once a week.   05/08/2019  . warfarin (COUMADIN) 3 MG tablet Take 1 tablet (3 mg total) by mouth one time only at 6 PM. (Patient taking differently: Take 5 mg by mouth one time only at 6 PM. ) 10 tablet 0 05/08/2019 at 1900  . EPIPEN 2-PAK 0.3 MG/0.3ML SOAJ injection INJECT 0.3 MLS (0.3 MG TOTAL) INTO THE MUSCLE ONCE. AS NEEDED FOR ANAPHYLACTIC REACTION 2 Device 2 unknown  . TRULICITY 1.5 0000000 SOPN Inject 1.5 mg as directed once a week.    Not Taking at Unknown time    Assessment: Pharmacy consulted to dose warfarin in patient with history of DVT/PE.  Patient's INR on admission is >10.  Home dose listed as 3 mg daily.  Patient given Vitamin K 5 mg PO 11/10-INR down to 6.6. hgb 8.1  Platelets 79  Goal of Therapy:  INR 2-3 Monitor platelets by anticoagulation protocol: Yes   Plan:  Hold warfarin x 1 dose Vitamkin K 2.5 mg IV x 1 dose ordered. Monitor INR and s/s of bleeding  Margot Ables, PharmD Clinical Pharmacist 05/10/2019 10:41 AM

## 2019-05-10 NOTE — Progress Notes (Signed)
[facility] on floor to transfer patient to [place] obtained by [facility] before transport. CBG 215. Patient stated that he will notify his wife.

## 2019-05-10 NOTE — Progress Notes (Addendum)
PROGRESS NOTE    [name]  [id number] DOB: 01/20/1966 DOA: 05/09/2019 PCP: Sharion Balloon, FNP    Brief Narrative:  53 year old male who presented with decreased urine output.  He does have a significant past medical history of chronic disease stage IV, bipolar, chronic respiratory failure on 3 L of submental oxygen per nasal cannula, type 2 diabetes mellitus, OSA/OHS, pulmonary embolism, seizures, thrombocytopenia, dyslipidemia and chronic pain syndrome.  Recent hospitalization 10/11 through 10/27 for SARS COVID-19 pneumonia.   He reported 1 week of decreased urine output and increased urinary frequency. On his initial physical examination blood pressure 111/53, heart rate 74, temperature 98.3, oxygen saturation 88%, his lungs had bibasilar rales, no wheezing, heart S1-S2 present rhythm, abdomen soft nontender, no lower extremity edema. Sodium 140, potassium 3.7, chloride 99, bicarb 32, glucose 144, BUN 57, creatinine 4.93, white count 3.1, hemoglobin 8.2, hematocrit 27.9, platelet 77.  SARS COVID-19 positive.  Urine had more than 50 white cells, more than 50 red cells, 100 protein, specific gravity 1.011.  Chest x-ray with bilateral interstitial infiltrates in the periphery.    Patient was admitted to the hospital with working diagnosis of acute kidney injury chronic kidney disease gated by urine tract infection and positive COVID-19.  Assessment & Plan:   Active Problems:   OSA (obstructive sleep apnea)   Obesity hypoventilation syndrome (HCC)   Seizure disorder (HCC)   Thrombocytopenia (HCC)   Chronic pain syndrome   Morbid obesity with BMI of 60.0-69.9, adult (HCC)   Acute renal failure superimposed on stage 4 chronic kidney disease ([place])   1. AKI on CKD stage IV. Renal function continue deteriorating, serum cr today is up to 5,17, K at 4,4 with BUN 61, serum bicarbonate 29. Urine output 1,800 cc over last 24 H. Worsening hypoxemia with oxygen saturation down to 83 to  88% on 6 LPM per [place]. He has been more somnolent this am. Blood pressure 94/46 ( left wrist blood pressure cuff, questionable accuracy). Patient will need a higher level of care, will transfer to [facility]. Will continue diuresis as tolerated, patient may need renal replacement therapy. I suspect his renal function has worsened as a sequelae from COVID 19, ATN. Will dc pantoprazole for now.   2. Acute on chronic hypoxic respiratory failure in the setting of recent COVID 19 pneumonia. His chest film has bilateral interstitial infiltrates and now has increase oxygen requirements. He was treated with remdesivir and dexamethasone from 10/11 to 10/27. I suspect more volume overload related pulmonary edema due to worsening renal function. Continue diuresis as tolerated, patient may need renal replacement therapy.   3. Sepsis due to urine infection/ end organ failure AKI, present on admission. Positive pyuria and hematuria, will continue antibiotic therapy with aztreonam, follow on cultures.   4. Pulmonary embolism with supra-therapeutic INR, vitamin K coagulopathy. Patient had vitamin K yesterday 5 mg, with INR down to 6.6 from 10.0. will add 2.5 IV vitamin K today, and will continue close monitoring of INR, holding further anticoagulation.   5. Seizures Will continue keppra for now. Continue neuro checks, no signs of active seizures. Continue with lamotrigine and keppra  6. Thrombocytopenia. Will continue to follow on cell counts.   7. Morbid obesity with dyslipidemia/ OSA/OHA. Calculated BMI is 54.0. High risk for hospital complications. Hold on cpap for now due to positive COVID 19.   8. Left arm stage 2 pressure ulcer. Continue with local care.   9. Bipolar continue with buspar, clomiphene, duloxetine.  10.  Chronic pain syndrome. On pregabaline, flexeril, and hydrocodone, will hold on fentanyl due to somnolence.   DVT prophylaxis: scd   Code Status:  Full Family Communication: I spoke with his wife over  the phone and all questions were addressed.  Disposition Plan/ discharge barriers:  Transfer to [facility]   Body mass index is 54.09 kg/m. Malnutrition Type:      Malnutrition Characteristics:      Nutrition Interventions:     RN Pressure Injury Documentation: Pressure Injury 05/09/19 Arm Left;Lower;Posterior;Proximal Stage II -  Partial thickness loss of dermis presenting as a shallow open ulcer with a red, pink wound bed without slough. 1.5x1.5 (Active)  05/09/19 1703  Location: Arm  Location Orientation: Left;Lower;Posterior;Proximal  Staging: Stage II -  Partial thickness loss of dermis presenting as a shallow open ulcer with a red, pink wound bed without slough.  Wound Description (Comments): 1.5x1.5  Present on Admission: Yes     Consultants:   Nephrology  Procedures:     Antimicrobials:   Aztreonam.    Subjective: Patient is somnolent, but easy to arouse, able to follow commands and respond to questions, denies any dyspnea or chest pain. Positive lower extremity edema, no further abdominal pain.   Objective: Vitals:   05/09/19 2231 05/10/19 0558 05/10/19 0951 05/10/19 1000  BP: (!) 106/53 (!) 95/44 (!) 94/46   Pulse: 81 93 92   Resp:  20 20 20   Temp: 99.1 F (37.3 C) 99.1 F (37.3 C) 99.1 F (37.3 C)   TempSrc: Axillary Oral Oral   SpO2:  92% (!) 83% (!) 88%  Weight:      Height:        Intake/Output Summary (Last 24 hours) at 05/10/2019 1026 Last data filed at 05/10/2019 0600 Gross per 24 hour  Intake 1780 ml  Output 1800 ml  Net -20 ml   Filed Weights   05/09/19 0928 05/09/19 1607  Weight: (!) 192.8 kg (!) 196.3 kg    Examination:   General: deconditioned and ill looking appearing  Neurology: Awake and alert, non focal  E ENT: mild pallor, no icterus, oral mucosa moist Cardiovascular: No JVD. S1-S2 present, rhythmic, no gallops, rubs, or murmurs. +++ non pitting bilateral lower extremity edema. Pulmonary: positive breath sounds  bilaterally, decreased air movement. Gastrointestinal. Abdomen protuberant with no organomegaly, non tender, no rebound or guarding Skin. No rashes Musculoskeletal: no joint deformities     Data Reviewed: I have personally reviewed following labs and imaging studies  CBC: Recent Labs  Lab 05/09/19 0959 05/10/19 0604  WBC 3.1* 3.4*  NEUTROABS 1.5*  --   HGB 8.2* 8.1*  HCT 27.9* 27.1*  MCV 101.1* 100.4*  PLT 77* 79*   Basic Metabolic Panel: Recent Labs  Lab 05/09/19 0959 05/10/19 0604  NA 140 140  K 3.7 4.4  CL 99 101  CO2 32 29  GLUCOSE 144* 181*  BUN 57* 61*  CREATININE 4.93* 5.17*  CALCIUM 8.3* 8.3*  PHOS  --  5.0*   GFR: Estimated Creatinine Clearance: 30.5 mL/min (A) (by C-G formula based on SCr of 5.17 mg/dL (H)). Liver Function Tests: Recent Labs  Lab 05/09/19 0959 05/10/19 0604  AST 27  --   ALT 14  --   ALKPHOS 65  --   BILITOT 0.8  --   PROT 6.8  --   ALBUMIN 2.4* 2.3*   No results for input(s): LIPASE, AMYLASE in the last 168 hours. No results for input(s): AMMONIA in the last 168 hours.  Coagulation Profile: Recent Labs  Lab 05/09/19 1326 05/10/19 0604  INR 10.0* 6.6*   Cardiac Enzymes: No results for input(s): CKTOTAL, CKMB, CKMBINDEX, TROPONINI in the last 168 hours. BNP (last 3 results) No results for input(s): PROBNP in the last 8760 hours. HbA1C: No results for input(s): HGBA1C in the last 72 hours. CBG: Recent Labs  Lab 05/09/19 1615 05/09/19 2222 05/10/19 0844  GLUCAP 173* 228* 160*   Lipid Profile: No results for input(s): CHOL, HDL, LDLCALC, TRIG, CHOLHDL, LDLDIRECT in the last 72 hours. Thyroid Function Tests: No results for input(s): TSH, T4TOTAL, FREET4, T3FREE, THYROIDAB in the last 72 hours. Anemia Panel: Recent Labs    05/09/19 0959 05/09/19 1326  VITAMINB12  --  1,212*  FOLATE  --  6.4  FERRITIN  --  75  TIBC  --  215*  IRON  --  39*  RETICCTPCT 2.9  --       Radiology Studies: I have reviewed all  of the imaging during this hospital visit personally     Scheduled Meds: . busPIRone  15 mg Oral BID  . Chlorhexidine Gluconate Cloth  6 each Topical Daily  . cholecalciferol  1,000 Units Oral Daily  . clomiPHENE  25 mg Oral QODAY  . DULoxetine  60 mg Oral Daily  . fentaNYL  1 patch Transdermal Q72H  . furosemide  40 mg Intravenous BID  . insulin aspart  0-5 Units Subcutaneous QHS  . insulin aspart  0-9 Units Subcutaneous TID WC  . insulin glargine  30 Units Subcutaneous QHS  . lamoTRIgine  200 mg Oral BID  . levETIRAcetam  1,000 mg Oral BID  . pantoprazole  80 mg Oral Q1200  . pregabalin  50 mg Oral Q M,W,F  . simvastatin  20 mg Oral QHS  . tamsulosin  0.4 mg Oral Daily  . vitamin C  500 mg Oral Q6H  . zinc sulfate  220 mg Oral BID   Continuous Infusions: . aztreonam 1 g (05/10/19 0331)  . phytonadione (VITAMIN K) IV       LOS: 1 day        Mauricio Gerome Apley, MD
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
## 2019-05-10 NOTE — Plan of Care (Signed)
Pt with increased need for O2 today, decreased LOC. Urine output < 200 ml, thick pink and cloudy. No food intake today due to decreased LOC> Pt to be transferred to [facility] for nephrology intervention/management Problem: Clinical Measurements: Goal: Diagnostic test results will improve Outcome: Progressing Goal: Cardiovascular complication will be avoided Outcome: Progressing   Problem: Elimination: Goal: Will not experience complications related to urinary retention Outcome: Progressing   Problem: Pain Managment: Goal: General experience of comfort will improve Outcome: Progressing   Problem: Safety: Goal: Ability to remain free from injury will improve Outcome: Progressing   Problem: Skin Integrity: Goal: Risk for impaired skin integrity will decrease Outcome: Progressing

## 2019-05-10 NOTE — Progress Notes (Addendum)
Report given to Velna Hatchet, RN at [facility].

## 2019-05-10 NOTE — TOC Initial Note (Signed)
Transition of Care [facility]) - Initial/Assessment Note    Patient Details  Name: [name] MRN: [id number] Date of Birth: Jan 06, 1966  [facility] CM/SW Contact:    Shade Flood, LCSW Phone Number: 05/10/2019, 12:02 PM  Clinical Narrative:                  Pt admitted from home. He is high risk for readmission. Pt was recently discharged from [facility] after stay for treatment of COVID. Pt was discharged from [facility] with [facility] for PT and OT. Unable to assess pt today as he is lethargic and cannot answer his phone. Spoke with pt's wife by phone. Emotional support provided as she is very tearful. She is unable to come see pt due to his Covid positive test result. Pt is still active with [facility] for [facility].  Plan is for pt to transfer to [facility] for a higher level of care. [name] wife is aware.   TOC at [facility] will follow pt for support and dc planning needs.  Expected Discharge Plan: [place] Barriers to Discharge: Continued Medical Work up   Patient Goals and CMS Choice        Expected Discharge Plan and Services Expected Discharge Plan: [place] In-house Referral: Clinical Social Work     Living arrangements for the past 2 months: [place]                                      Prior Living Arrangements/Services Living arrangements for the past 2 months: Single Family Home Lives with:: Spouse Patient language and need for interpreter reviewed:: Yes Do you feel safe going back to the place where you live?: Yes      Need for Family Participation in Patient Care: Yes (Comment) Care giver support system in place?: Yes (comment) Current home services: DME, Home OT, Home PT Criminal Activity/Legal Involvement Pertinent to Current Situation/Hospitalization: No - Comment as needed  Activities of Daily Living Home Assistive Devices/Equipment: Electric scooter, Bedside commode/3-in-1 ADL Screening (condition at time of  admission) Patient's cognitive ability adequate to safely complete daily activities?: Yes Is the patient deaf or have difficulty hearing?: No Does the patient have difficulty seeing, even when wearing glasses/contacts?: No Does the patient have difficulty concentrating, remembering, or making decisions?: Yes Patient able to express need for assistance with ADLs?: Yes Does the patient have difficulty dressing or bathing?: Yes Independently performs ADLs?: No Communication: Independent Dressing (OT): Dependent Is this a change from baseline?: Pre-admission baseline Grooming: Dependent Is this a change from baseline?: Pre-admission baseline Feeding: Needs assistance Is this a change from baseline?: Pre-admission baseline Bathing: Dependent Is this a change from baseline?: Pre-admission baseline Toileting: Dependent Is this a change from baseline?: Pre-admission baseline In/Out Bed: Dependent Is this a change from baseline?: Pre-admission baseline Walks in Home: Dependent Is this a change from baseline?: Pre-admission baseline Does the patient have difficulty walking or climbing stairs?: Yes Weakness of Legs: Both Weakness of Arms/Hands: Both  Permission Sought/Granted                  Emotional Assessment Appearance:: Appears stated age Attitude/Demeanor/Rapport: Unable to Assess Affect (typically observed): Unable to Assess Orientation: : Oriented to Self, Oriented to Place, Oriented to  Time, Oriented to Situation Alcohol / Substance Use: Not Applicable Psych Involvement: No (comment)  Admission diagnosis:  Acute UTI [N39.0] Acute kidney injury superimposed on chronic kidney disease ([place]) [N17.9, N18.9] Patient Active Problem List   Diagnosis Date Noted  . Acute renal failure superimposed on stage 4 chronic kidney disease ([place]) 05/09/2019  . Chronic respiratory failure with hypoxia ([place]) 04/12/2019  . History of pulmonary embolus (PE) 04/12/2019  . Morbid obesity with  BMI of 60.0-69.9, adult ([place]) 04/12/2019  . Seizures ([place]) 04/12/2019  . Diabetes mellitus type 2, uncontrolled, with complications ([place]) [id number]  . Acute renal failure superimposed on stage 3b chronic kidney disease ([place]) 04/12/2019  . Pressure injury of skin 04/11/2019  . Pneumonia due to COVID-19 virus 04/09/2019  . Tremor 02/22/2019  . Chronic back pain 11/10/2018  . Depression 05/24/2017  . Chronic migraine without aura, intractable, with status migrainosus 04/02/2017  . Pain medication agreement signed 08/30/2015  . Opioid dependence ([place]) 08/02/2015  . Hyperlipidemia associated with type 2 diabetes mellitus ([place]) 05/31/2015  . Other vascular headache 05/03/2015  . Vitamin D deficiency 11/23/2014  . Testosterone deficiency 11/23/2014  . Intractable chronic migraine without aura 10/02/2014  . GAD (generalized anxiety disorder) 01/24/2014  . GERD (gastroesophageal reflux disease) 01/24/2014  . BPH (benign prostatic hyperplasia) 01/24/2014  . Postoperative pulmonary edema ([place]) 09/15/2012  . History of DVT (deep vein thrombosis) 09/15/2012  . Anemia 05/11/2012  . Thrombocytopenia ([place]) 05/11/2012  . Acute renal failure ([place]) 05/11/2012  . Chronic pain syndrome 05/11/2012  . Morbid obesity ([place]) 05/10/2012  . OSA (obstructive sleep apnea) 05/10/2012  . Obesity hypoventilation syndrome ([place]) 05/10/2012  . DM2 (diabetes mellitus, type 2) ([place]) 05/10/2012  . History of pulmonary embolism 05/10/2012  . Chronic anticoagulation 05/10/2012  . Seizure disorder ([place]) 05/10/2012  . DYSPNEA 02/05/2009   PCP:  Sharion Balloon, FNP Pharmacy:   [id number] - [place], [address] [place] [address] Phone: [phone] Fax: [phone]     Social Determinants of Health (SDOH) Interventions    Readmission Risk Interventions Readmission Risk Prevention Plan 05/10/2019  Transportation Screening Complete  Medication Review (RN Care  Manager) Complete  [place] Not Applicable  Some recent data might be hidden

## 2019-05-10 NOTE — Progress Notes (Signed)
CRITICAL VALUE ALERT  Critical Value:  Covid Positive  Date & Time Notied: 05/10/19 0103  Provider Notified: Dr. Scherrie November  Orders Received/Actions taken: No new orders at this time

## 2019-05-10 NOTE — Progress Notes (Signed)
Upon morning assessment, pt was extremely lethargic, arousable only to painful stimuli and then only able to stay awake for a few seconds. SaO2 80% on 4 lpm [place], pt mouth breathing 20/min. BP 94/46, temp 99.1 oral. O2 increased to 6 lpm [place] and  placed over pt's mouth d/t mouth breathing. RT and MD notified of pt's condition. MD states will be in soon to evaluate pt. [name] SaO2 up to 87-88% @ 6lpm.HOB raised and encouraged pt to wake up and take deep breaths. [name] respirations up to 24/min and SaO2 92%. Pt more alert but speech still slurred. Pt denies complaint of pain, crying now, asking for his wife to be able to visit and asking not to be sent back to [facility]. Pt was able to take am oral meds with applesauce, tolerated po liquids without difficulty. Still dozes back off to sleep unless stimulated to stay awake.

## 2019-05-10 NOTE — Progress Notes (Signed)
CRITICAL VALUE ALERT  Critical Value:  INR 6.6  Date & Time Notied:  05/10/19 0731  Provider Notified: Dr. Cathlean Sauer  Orders Received/Actions taken: No new orders at this time

## 2019-05-10 NOTE — Progress Notes (Signed)
Patient transported to [facility] via [place]. Notified Zacarias Pontes 69M, Medical ICU. This nurse gave her number for them to call back for report when they are available.

## 2019-05-11 ENCOUNTER — Inpatient Hospital Stay (HOSPITAL_COMMUNITY): Payer: Medicare Other

## 2019-05-11 DIAGNOSIS — E1165 Type 2 diabetes mellitus with hyperglycemia: Secondary | ICD-10-CM

## 2019-05-11 DIAGNOSIS — R791 Abnormal coagulation profile: Secondary | ICD-10-CM | POA: Clinically undetermined

## 2019-05-11 DIAGNOSIS — Z91041 Radiographic dye allergy status: Secondary | ICD-10-CM

## 2019-05-11 DIAGNOSIS — Z7901 Long term (current) use of anticoagulants: Secondary | ICD-10-CM

## 2019-05-11 DIAGNOSIS — Z8619 Personal history of other infectious and parasitic diseases: Secondary | ICD-10-CM

## 2019-05-11 DIAGNOSIS — R0602 Shortness of breath: Secondary | ICD-10-CM

## 2019-05-11 DIAGNOSIS — J9611 Chronic respiratory failure with hypoxia: Secondary | ICD-10-CM

## 2019-05-11 DIAGNOSIS — N390 Urinary tract infection, site not specified: Secondary | ICD-10-CM

## 2019-05-11 DIAGNOSIS — Z86711 Personal history of pulmonary embolism: Secondary | ICD-10-CM

## 2019-05-11 DIAGNOSIS — Z91013 Allergy to seafood: Secondary | ICD-10-CM

## 2019-05-11 DIAGNOSIS — B952 Enterococcus as the cause of diseases classified elsewhere: Secondary | ICD-10-CM | POA: Diagnosis present

## 2019-05-11 DIAGNOSIS — F319 Bipolar disorder, unspecified: Secondary | ICD-10-CM

## 2019-05-11 DIAGNOSIS — D696 Thrombocytopenia, unspecified: Secondary | ICD-10-CM

## 2019-05-11 DIAGNOSIS — F331 Major depressive disorder, recurrent, moderate: Secondary | ICD-10-CM

## 2019-05-11 DIAGNOSIS — N184 Chronic kidney disease, stage 4 (severe): Secondary | ICD-10-CM

## 2019-05-11 DIAGNOSIS — Z880 Allergy status to penicillin: Secondary | ICD-10-CM

## 2019-05-11 DIAGNOSIS — E118 Type 2 diabetes mellitus with unspecified complications: Secondary | ICD-10-CM

## 2019-05-11 DIAGNOSIS — N401 Enlarged prostate with lower urinary tract symptoms: Secondary | ICD-10-CM

## 2019-05-11 DIAGNOSIS — Z6841 Body Mass Index (BMI) 40.0 and over, adult: Secondary | ICD-10-CM

## 2019-05-11 DIAGNOSIS — D61818 Other pancytopenia: Secondary | ICD-10-CM | POA: Diagnosis present

## 2019-05-11 DIAGNOSIS — G8929 Other chronic pain: Secondary | ICD-10-CM

## 2019-05-11 DIAGNOSIS — A498 Other bacterial infections of unspecified site: Secondary | ICD-10-CM | POA: Diagnosis present

## 2019-05-11 DIAGNOSIS — Z91030 Bee allergy status: Secondary | ICD-10-CM

## 2019-05-11 DIAGNOSIS — M545 Low back pain: Secondary | ICD-10-CM

## 2019-05-11 DIAGNOSIS — Z91040 Latex allergy status: Secondary | ICD-10-CM

## 2019-05-11 LAB — CREATININE, URINE, RANDOM: Creatinine, Urine: 81.73 mg/dL

## 2019-05-11 LAB — BASIC METABOLIC PANEL
Anion gap: 12 (ref 5–15)
BUN: 67 mg/dL — ABNORMAL HIGH (ref 6–20)
CO2: 26 mmol/L (ref 22–32)
Calcium: 8.2 mg/dL — ABNORMAL LOW (ref 8.9–10.3)
Chloride: 97 mmol/L — ABNORMAL LOW (ref 98–111)
Creatinine, Ser: 5.49 mg/dL — ABNORMAL HIGH (ref 0.61–1.24)
GFR calc Af Amer: 13 mL/min — ABNORMAL LOW (ref >60)
GFR calc non Af Amer: 11 mL/min — ABNORMAL LOW (ref >60)
Glucose, Bld: 219 mg/dL — ABNORMAL HIGH (ref 70–99)
Potassium: 3.8 mmol/L (ref 3.5–5.1)
Sodium: 135 mmol/L (ref 135–145)

## 2019-05-11 LAB — GLUCOSE, CAPILLARY
Glucose-Capillary: 217 mg/dL — ABNORMAL HIGH (ref 70–99)
Glucose-Capillary: 211 mg/dL — ABNORMAL HIGH (ref 70–99)
Glucose-Capillary: 219 mg/dL — ABNORMAL HIGH (ref 70–99)
Glucose-Capillary: 229 mg/dL — ABNORMAL HIGH (ref 70–99)
Glucose-Capillary: 246 mg/dL — ABNORMAL HIGH (ref 70–99)

## 2019-05-11 LAB — PROTIME-INR
INR: 1.8 — ABNORMAL HIGH (ref 0.8–1.2)
Prothrombin Time: 20.7 seconds — ABNORMAL HIGH (ref 11.4–15.2)

## 2019-05-11 LAB — URINE CULTURE: Culture: >=100000 — AB

## 2019-05-11 LAB — LACTIC ACID, PLASMA
Lactic Acid, Venous: 1.3 mmol/L (ref 0.5–1.9)
Lactic Acid, Venous: 1.6 mmol/L (ref 0.5–1.9)

## 2019-05-11 LAB — MRSA PCR SCREENING: MRSA by PCR: NEGATIVE

## 2019-05-11 LAB — SODIUM, URINE, RANDOM: Sodium, Ur: 24 mmol/L

## 2019-05-11 MED ORDER — WARFARIN - PHARMACIST DOSING INPATIENT
Freq: Every day | Status: DC
  Administered 2019-05-13 – 2019-05-14 (×2)

## 2019-05-11 MED ORDER — WARFARIN SODIUM 5 MG PO TABS
5.0000 mg | ORAL_TABLET | Freq: Once | ORAL | Status: AC
  Administered 2019-05-11: 5 mg via ORAL
  Filled 2019-05-11: qty 1

## 2019-05-11 MED ORDER — INSULIN ASPART 100 UNIT/ML ~~LOC~~ SOLN
0.0000 [IU] | Freq: Three times a day (TID) | SUBCUTANEOUS | Status: DC
  Administered 2019-05-11 (×2): 3 [IU] via SUBCUTANEOUS
  Administered 2019-05-12: 5 [IU] via SUBCUTANEOUS
  Administered 2019-05-12: 3 [IU] via SUBCUTANEOUS

## 2019-05-11 MED ORDER — SODIUM CHLORIDE 0.9 % IV SOLN
700.0000 mg | Freq: Every day | INTRAVENOUS | Status: DC
  Filled 2019-05-11: qty 14

## 2019-05-11 MED ORDER — METHYLPREDNISOLONE SODIUM SUCC 125 MG IJ SOLR
40.0000 mg | Freq: Once | INTRAMUSCULAR | Status: DC | PRN
  Filled 2019-05-11: qty 2

## 2019-05-11 MED ORDER — SALINE SPRAY 0.65 % NA SOLN
1.0000 | NASAL | Status: DC | PRN
  Filled 2019-05-11: qty 44

## 2019-05-11 MED ORDER — AMOXICILLIN 500 MG PO CAPS
500.0000 mg | ORAL_CAPSULE | Freq: Once | ORAL | Status: AC
  Administered 2019-05-11: 500 mg via ORAL
  Filled 2019-05-11 (×2): qty 1

## 2019-05-11 MED ORDER — EPINEPHRINE 0.3 MG/0.3ML IJ SOAJ
0.3000 mg | Freq: Once | INTRAMUSCULAR | Status: DC | PRN
  Filled 2019-05-11 (×2): qty 0.6

## 2019-05-11 MED ORDER — DIPHENHYDRAMINE HCL 50 MG/ML IJ SOLN
25.0000 mg | Freq: Once | INTRAMUSCULAR | Status: DC | PRN
  Filled 2019-05-11: qty 1

## 2019-05-11 MED ORDER — SODIUM CHLORIDE 0.9 % IV BOLUS
500.0000 mL | Freq: Once | INTRAVENOUS | Status: AC
  Administered 2019-05-11: 500 mL via INTRAVENOUS

## 2019-05-11 MED ORDER — INSULIN GLARGINE 100 UNIT/ML ~~LOC~~ SOLN
10.0000 [IU] | Freq: Every day | SUBCUTANEOUS | Status: DC
  Administered 2019-05-11: 10 [IU] via SUBCUTANEOUS
  Filled 2019-05-11 (×2): qty 0.1

## 2019-05-11 NOTE — Progress Notes (Signed)
Pharmacy Penicillin Allergy Assessment    Patient history of penicillin or beta lactam allergy: [name] notes that as a child he had penicillin many times with no issues but once when he was 20 or 21 he had a dose of penicillin and he developed red spots all over his body. He does not recall having any issues breathing but did got to the ED for management.   I spoke with Mr. Wiltgen and discussed that 80% of patients with a penicillin allergy lose it after 10 years and that we would like to consider a penicillin antibiotic for treatment of his UTI.    He is okay with preceding with an oral amoxicillin challenge this afternoon.   I discussed with his nurse , Solmon Ice, and she will give a 1 time dose of amoxicillin this afternoon and monitor for 1 hour with vital every 15 minutes.   If patient responds well to amoxicillin challenge, will treat UTI with amoxicililn/ampicillin.    Jimmy Footman, PharmD, BCPS, BCIDP Infectious Diseases Clinical Pharmacist Phone: [phone] 05/11/2019 2:45 PM

## 2019-05-11 NOTE — Progress Notes (Addendum)
[facility]  PROGRESS NOTE  [name] [id number] DOB: [birth year] DOA: 05/09/2019 PCP: Patrick Balloon, FNP Admit date - 05/09/2019   Admitting Physician Patrick Eva, MD  Outpatient Primary MD for the patient is Patrick Balloon, FNP  LOS - 2 Brief Narrative   [name] is a 53 y.o. year old male with medical history significant for chronic disease stage IV, bipolar, chronic respiratory failure on 3 L of submental oxygen per nasal cannula, DVT/PE on coumadin,type 2 diabetes mellitus, OSA/OHS, pulmonary embolism, seizures, thrombocytopenia, dyslipidemia and chronic pain syndrome.  Recent hospitalization 10/11 through 10/27 for SARS COVID-19 pneumonia who presented on 05/09/2019 as transfer.  Initially admitted at [facility] for decreasing urinary output, blood in urine and urinary frequency and admitted with working diagnosis of Aki on CKD complicated by UTI. Due to high concern for need for CVVHD and worsening respiratory status and hemodynamics and was found to have working diagnosis of aki on CKD complicated by UTI and recent COVID-19 infection.    Subjective  [name]@ [place] today has persistently high o2 requirements but denies cough or SOB. No abdominal pain, no nausea or vomiting.   A & P    1. AKI on CKD stage IV, worsening. Diminished output despite IV diuresis, seems volume overloaded with pulmonary edema per OSH CXR, BUN and creatinine worsening. Baseline cr has been 3.6-4, on disharge on 10/27 it was 3.47, prior to transfer to [facility] it was 5.17. Likely ATN related to hypotension in setting of his UTI, nephrology following, high concern may need CVV HD if renal function continues to worsen, serial BMP, avoid nephrotoxins, check renal ultrasound-does have BPH  2. Acute on chronic hypoxic respiratory failure. Currently on 5L o2, home regimen of 3 L for baseline OHS. CXR shows bilateral interstitial infiltrates with concern for pulmonary edema related to worsening  kidney function at OSH, continue IV lasix 40 mg BID, minimal output so far . Could either be new COVID infection (addresed in #4) or sequela of prior infection. Repeat CXR here. Addressing kidney dysfunction will help if this is all from pulm edema  3. Hypotension, resolved. Reported SBP in 90s prior to transfer. Doing well now with SBP in 120s currently. Closely monitor   4. Sepsis, probably related to E. Faecalis UTI, On transfer was on aztreonam. Given urine culture findings and listed PCN anaphylaxis allergy on Daptomycin per ID, will consider oral challenge once more alert and able to consent, follow sensitivities of cultures. Remains afebrile, has leukopenia, hemodynamically stable.  Pulmonary infiltrates could be HCAP or sequelae of prior COVID infection  5. COVID + infection. Initially positive 04/09/19. Negative on 05/03/19. Again positive on 05/09/19. This likely represents new infection?  6. Acute on Chronic Pancytopenia. Baseline hgb of 10, currently 8 but platelet count is improved from prior Goal hgb >7, and plt > 10(as long as not bleeding), transfuse to maintain goals  7. PE, complicated by supratherapetuic INR (peak of 10, S/p vitamin K, INR 6). Repeating INR this am--now 1.8. No current bleeding, will ask pharmacy to manage heparin given no current bleeding and now INR subtherapeutic   8. T2DM. Monitor CBG, lantus 10 U ( lower than home dose), hold home jardiance  9. OSA/OHS. Increased O2 requirements as above (home regimen of 3 L) not on CPAP at home  10. Depression and Bipolar disorder, stable. Continue home buspar, , clomiphene, cymbalata  11. Chronic pain syndrome, stable. Continue home flexeril PRN, lyrica, PRN norco. Held home fentanyl  patch  12. Seizure disorder, stable. Continue home keppra and lamictal  13. Hyperlipidemia, stable. Continue home simvastatin  14. BPH, stable. Continue home flomax     Family Communication  :  None at bedside, sopoke with [name] at [phone] on 05/11/19  Code Status :  FULL  Disposition Plan  :  Monitoring kidney function may need intervention if no improvement, respiratory/oxygen status, on IV daptomycin for E.faecalis  Consults  :  Nephrology, ID,   Procedures  :  none  DVT Prophylaxis  :  Heparin  Lab Results  Component Value Date   PLT 79 (L) 05/10/2019    Diet :  Diet Order            Diet Carb Modified Fluid consistency: Thin; Room service appropriate? Yes  Diet effective now               Inpatient Medications Scheduled Meds: . busPIRone  15 mg Oral BID  . Chlorhexidine Gluconate Cloth  6 each Topical Daily  . cholecalciferol  1,000 Units Oral Daily  . clomiPHENE  25 mg Oral QODAY  . DULoxetine  60 mg Oral Daily  . fentaNYL  1 patch Transdermal Q72H  . furosemide  40 mg Intravenous BID  . lamoTRIgine  200 mg Oral BID  . levETIRAcetam  1,000 mg Oral BID  . pregabalin  50 mg Oral Q M,W,F  . simvastatin  20 mg Oral QHS  . tamsulosin  0.4 mg Oral Daily  . vitamin C  500 mg Oral Q6H  . zinc sulfate  220 mg Oral BID   Continuous Infusions: . aztreonam 1 g (05/11/19 0847)   PRN Meds:.acetaminophen **OR** acetaminophen, cyclobenzaprine, HYDROcodone-acetaminophen, ondansetron **OR** ondansetron (ZOFRAN) IV  Antibiotics  :   Anti-infectives (From admission, onward)   Start     Dose/Rate Route Frequency Ordered Stop   05/09/19 2000  aztreonam (AZACTAM) 1 g in sodium chloride 0.9 % 100 mL IVPB     1 g 200 mL/hr over 30 Minutes Intravenous Every 8 hours 05/09/19 1348     05/09/19 1145  aztreonam (AZACTAM) 1 g in sodium chloride 0.9 % 100 mL IVPB     1 g 200 mL/hr over 30 Minutes Intravenous  Once 05/09/19 1144 05/09/19 1320       Objective   Vitals:   05/11/19 0400 05/11/19 0500 05/11/19 0600 05/11/19 0700  BP: 126/66 124/67 132/68 (!) 126/107  Pulse: 72 72 74 75  Resp: 18 17 19 17   Temp:      TempSrc:      SpO2: 97% 98% 95% 97%  Weight:   (!) 196.2 kg   Height:         SpO2: 97 % O2 Flow Rate (L/min): 6 L/min  Wt Readings from Last 3 Encounters:  05/11/19 (!) 196.2 kg  04/23/19 (!) 189.1 kg  02/08/19 (!) 205.2 kg     Intake/Output Summary (Last 24 hours) at 05/11/2019 1027 Last data filed at 05/11/2019 0900 Gross per 24 hour  Intake 840 ml  Output 1425 ml  Net -585 ml    Physical Exam:  Awake Alert, Oriented X 3, Normal affect, obese male No new F.N deficits,  [place].AT, No JVD appreciated Difficult to auscultate due to body habitus, on 5 L [place] in no respiratory distress RRR,No Gallops,Rubs or new Murmurs,  +ve B.Sounds, Abd Soft, No tenderness, No organomegaly appreciated, No rebound, guarding or rigidity. No Cyanosis, Clubbing or edema, No new Rash or bruise  I have personally reviewed the following:   Data Reviewed:  CBC Recent Labs  Lab 05/09/19 0959 05/10/19 0604  WBC 3.1* 3.4*  HGB 8.2* 8.1*  HCT 27.9* 27.1*  PLT 77* 79*  MCV 101.1* 100.4*  MCH 29.7 30.0  MCHC 29.4* 29.9*  RDW 15.0 14.9  LYMPHSABS 1.1  --   MONOABS 0.3  --   EOSABS 0.1  --   BASOSABS 0.0  --     Chemistries  Recent Labs  Lab 05/09/19 0959 05/10/19 0604  NA 140 140  K 3.7 4.4  CL 99 101  CO2 32 29  GLUCOSE 144* 181*  BUN 57* 61*  CREATININE 4.93* 5.17*  CALCIUM 8.3* 8.3*  AST 27  --   ALT 14  --   ALKPHOS 65  --   BILITOT 0.8  --    ------------------------------------------------------------------------------------------------------------------ No results for input(s): CHOL, HDL, LDLCALC, TRIG, CHOLHDL, LDLDIRECT in the last 72 hours.  Lab Results  Component Value Date   HGBA1C 7.9 (H) 04/10/2019   ------------------------------------------------------------------------------------------------------------------ No results for input(s): TSH, T4TOTAL, T3FREE, THYROIDAB in the last 72 hours.  Invalid input(s): FREET3  ------------------------------------------------------------------------------------------------------------------ Recent Labs    05/09/19 0959 05/09/19 1326  VITAMINB12  --  1,212*  FOLATE  --  6.4  FERRITIN  --  75  TIBC  --  215*  IRON  --  39*  RETICCTPCT 2.9  --     Coagulation profile Recent Labs  Lab 05/09/19 1326 05/10/19 0604  INR 10.0* 6.6*    No results for input(s): DDIMER in the last 72 hours.  Cardiac Enzymes No results for input(s): CKMB, TROPONINI, MYOGLOBIN in the last 168 hours.  Invalid input(s): CK ------------------------------------------------------------------------------------------------------------------    Component Value Date/Time   BNP 137.9 (H) 04/24/2019 0145    Micro Results Recent Results (from the past 240 hour(s))  Novel Coronavirus, NAA ([facility])     Status: None   Collection Time: 05/03/19 12:00 AM   Specimen: Oropharyngeal(OP) collection in vial transport medium   OROPHARYNGEA  TESTING  Result Value Ref Range Status   SARS-CoV-2, NAA Not Detected Not Detected Final    Comment: Testing was performed using the cobas(R) SARS-CoV-2 test. This nucleic acid amplification test was developed and its performance characteristics determined by [employer]. Nucleic acid amplification tests include PCR and TMA. This test has not been FDA cleared or approved. This test has been authorized by FDA under an Emergency Use Authorization (EUA). This test is only authorized for the duration of time the declaration that circumstances exist justifying the authorization of the emergency use of in vitro diagnostic tests for detection of SARS-CoV-2 virus and/or diagnosis of COVID-19 infection under section 564(b)(1) of the Act, 21 U.S.C. PT:[id number]) (1), unless the authorization is terminated or revoked sooner. When diagnostic testing is negative, the possibility of a false negative result should be considered in the context of a patient's  recent exposures and the presence of clinical signs and symptoms consistent with COVID-19. An individual without symptoms  of COVID-19 and who is not shedding SARS-CoV-2 virus would expect to have a negative (not detected) result in this assay.   Urine culture     Status: Abnormal (Preliminary result)   Collection Time: 05/09/19  9:38 AM   Specimen: Urine, Clean Catch  Result Value Ref Range Status   Specimen Description   Final    URINE, CLEAN CATCH Performed at [facility], [address], [place], [place] [address]    Special Requests  Final    NONE Performed at [facility], [address] [place], [place] [address]    Culture (A)  Final    >=100,000 COLONIES/mL ENTEROCOCCUS FAECALIS SUSCEPTIBILITIES TO FOLLOW Performed at [facility] [address], [place], [place] [address]    Report Status PENDING  Incomplete  SARS CORONAVIRUS 2 (TAT 6-24 HRS) Nasopharyngeal Nasopharyngeal Swab     Status: Abnormal   Collection Time: 05/09/19 10:39 AM   Specimen: Nasopharyngeal Swab  Result Value Ref Range Status   SARS Coronavirus 2 POSITIVE (A) NEGATIVE Final    Comment: (NOTE) SARS-CoV-2 target nucleic acids are DETECTED. The SARS-CoV-2 RNA is generally detectable in upper and lower respiratory specimens during the acute phase of infection. Positive results are indicative of active infection with SARS-CoV-2. Clinical  correlation with patient history and other diagnostic information is necessary to determine patient infection status. Positive results do  not rule out bacterial infection or co-infection with other viruses. The expected result is Negative. Fact Sheet for Patients: SugarRoll.be Fact Sheet for Healthcare Providers: https://www.woods-mathews.com/ This test is not yet approved or cleared by the [place] FDA and  has been authorized for detection and/or diagnosis of SARS-CoV-2 by FDA under an Emergency Use  Authorization (EUA). This EUA will remain  in effect (meaning this test can be used) for the duration of the COVID-19 declaration under Section 564(b)(1) of the Act, 21 U.S.C.  section 360bbb-3(b)(1), unless the authorization is terminated or revoked sooner. Performed at [facility], [place] [address], [place], [place] [address]   MRSA PCR Screening     Status: None   Collection Time: 05/11/19 12:37 AM   Specimen: Nasal Mucosa; Nasopharyngeal  Result Value Ref Range Status   MRSA by PCR NEGATIVE NEGATIVE Final    Comment:        The GeneXpert MRSA Assay (FDA approved for NASAL specimens only), is one component of a comprehensive MRSA colonization surveillance program. It is not intended to diagnose MRSA infection nor to guide or monitor treatment for MRSA infections. Performed at [facility], [place] [address], [place], [place] [address]     Radiology Reports Dg Chest Port 1 View  Result Date: 04/19/2019 CLINICAL DATA:  Hypoxia. COVID-19 pneumonia. EXAM: PORTABLE CHEST 1 VIEW COMPARISON:  One-view chest x-ray 04/09/2019 FINDINGS: Peripheral airspace opacities are again seen bilaterally. Greatest densities in the right upper lobe. Overall aeration is improved. IMPRESSION: 1. Shifting appearance of extensive bilateral airspace disease compatible with pneumonia. 2. Overall aeration is improved with some residual disease in the right upper lobe. Electronically Signed   By: San Morelle M.D.   On: 04/19/2019 12:37     Time Spent in minutes  30     Desiree Hane M.D on 05/11/2019 at 10:27 AM  To page go to [email or web address] - password [facility]

## 2019-05-11 NOTE — Consult Note (Addendum)
[place] Nurse wound consult note Patient receiving care in [facility] 2M13.  I spoke with his primary RN, Solmon Ice, via telephone about the sacral wound consult.  This consult is being completed remotely.  If the primary RN prefers, I can camera in via ELink to view the area. Patient is on Pharmacologist Precautions. Reason for Consult: "DTI sacrum" Wound type: According to the phone conversation the area is consistent with a DTPI.  Also, staff have attempted to provide the patient with a size appropriate bed with air mattress, and he has refused in the past.  I have placed an order for a SizeWise bed with air mattress.  This would allow space for adequate turning and off-loading of pressure to the sacral area.  This is vitally important when trying to reverse the deleterious effects of prolonged pressure to this site. Solmon Ice has agreed to explain the rationale to the patient, and hopefully he will agree to this intervention.  Also, she will measure the area and notify me when her assessment is completed. At 1320 the staff placed a SizeWise bed with low air loss mattress.  The patient stated to [place] that before his hospitalization, he fell striking his sacrum and left buttock on the doorframe of his vehicle.  The purple bruising to the sacrum and left buttock represent this injury.  This is not a PI. Continue to use the air mattress and off load. Monitor the wound area(s) for worsening of condition such as: Signs/symptoms of infection,  Increase in size,  Development of or worsening of odor, Development of pain, or increased pain at the affected locations.  Notify the medical team if any of these develop.  Thank you for the consult.  Discussed plan of care with the patient and bedside nurse.  [place] nurse will not follow at this time.  Please re-consult the [place] team if needed.  Val Riles, RN, MSN, CWOCN, CNS-BC, pager [phone]

## 2019-05-11 NOTE — Consult Note (Signed)
[facility]       Reason for Consult: Enterococcal urinary infection    Referring Physician: Dr. Teryl Lucy  Active Problems:   OSA (obstructive sleep apnea)   Obesity hypoventilation syndrome (HCC)   Seizure disorder (HCC)   Thrombocytopenia (HCC)   Chronic pain syndrome   Morbid obesity with BMI of 60.0-69.9, adult (HCC)   Acute renal failure superimposed on stage 4 chronic kidney disease (HCC)   Enterococcus faecalis infection   . busPIRone  15 mg Oral BID  . Chlorhexidine Gluconate Cloth  6 each Topical Daily  . cholecalciferol  1,000 Units Oral Daily  . clomiPHENE  25 mg Oral QODAY  . DULoxetine  60 mg Oral Daily  . fentaNYL  1 patch Transdermal Q72H  . furosemide  40 mg Intravenous BID  . insulin aspart  0-9 Units Subcutaneous TID WC  . insulin glargine  10 Units Subcutaneous QHS  . lamoTRIgine  200 mg Oral BID  . levETIRAcetam  1,000 mg Oral BID  . pregabalin  50 mg Oral Q M,W,F  . simvastatin  20 mg Oral QHS  . tamsulosin  0.4 mg Oral Daily  . vitamin C  500 mg Oral Q6H  . zinc sulfate  220 mg Oral BID    Recommendations: daptomycin for 3 days Will consider oral challenge of penicillin when more alert   Assessment: He has Enterococcus in the urine with a history of a severe penicillin allergy.  It appears though he has received Zosyn in the past.  Will use daptomycin for now.  No bacteremia so will not need a TEE or other work up.  No fever or associated systemic symptoms so 3 days of treatment will be adequate.  Aztreonam does not cover it.  COVID-19 and on isolation, bilateral infiltrates and increased oxygen needs.  Also c/w pulmonary edema. Previous positive so now is past the window of 21 days, so have stopped isolation.   Antibiotics: Aztreonam x 3 days.   HPI: [name] is a 53 y.o. male with CKD stage IV, bipolar, thrombocytopenia who presented to [facility] with urinary frequency x 1 week and decreased urine output.  Urine  with high WBCs and culture with Enterococcus faecalis.  Had been started on aztreonam and now on daptomycin. WBC wnl, no fever.  He was admitted to [facility] in October for symptomatic COVID and treated and discharged on 10/27 with his previous positive test on 10/11, now > 21 days since infection.  CXR independently reviewed and more c/w pulmonary edema.   Review of Systems:  Constitutional: negative for fevers and chills Gastrointestinal: negative for nausea and diarrhea All other systems reviewed and are negative    Past Medical History:  Diagnosis Date  . Anemia   . Anxiety   . Arthritis   . Bipolar 1 disorder ([place])   . Bronchitis    hx of  . Bruises easily   . Chronic kidney disease    decreased left kidney fx  . Chronic pain syndrome 05/11/2012  . Chronic respiratory failure with hypoxia (HCC)    And with hypercapnia  . Diabetes mellitus without complication ([place])   . Diabetic neuropathy ([place])   . DVT (deep venous thrombosis) (HCC)    LLE DVT ~ '12  . Dyspnea    with ambulation  . GERD (gastroesophageal reflux disease)   . History of 2019 novel coronavirus disease (COVID-19)   . HOH (hard of hearing) 2015   has hearing  aids  . Mental disorder   . Migraine   . Neuromuscular disorder ([place])   . Obesity hypoventilation syndrome ([place])   . Obstructive sleep apnea    CPAP  . PE (pulmonary embolism)    bilateral PE ~ '11  . Seizures ([place])   . Thrombocytopenia ([place]) 05/11/2012    Social History   Tobacco Use  . Smoking status: Never Smoker  . Smokeless tobacco: Never Used  Substance Use Topics  . Alcohol use: No  . Drug use: No    Family History  Problem Relation Age of Onset  . Liver cancer Mother   . Cancer Mother        breast  . Arthritis Father   . Deep vein thrombosis Father        on warfarin  . Diabetes Paternal Grandfather     Allergies  Allergen Reactions  . Bee Venom Anaphylaxis, Shortness Of Breath and Swelling  . Other Shortness Of Breath     Itching, rash with IVP DYE, iodine, shellfish LATEX  . Penicillins Anaphylaxis and Shortness Of Breath    Has patient had a PCN reaction causing immediate rash, facial/tongue/throat swelling, SOB or lightheadedness with hypotension: Yes Has patient had a PCN reaction causing severe rash involving mucus membranes or skin necrosis: No Has patient had a PCN reaction that required hospitalization No Has patient had a PCN reaction occurring within the last 10 years: No If all of the above answers are "NO", then may proceed with Cephalosporin use.   . Shellfish Allergy Nausea And Vomiting and Other (See Comments)    Feels like insides are twisting  . Iodinated Diagnostic Agents     Other reaction(s): RASH  . Iohexol      Code: RASH, Desc: PT WAS ON PREDNISONE FOR GOUT TX. @ TIME OF SCAN AND RECEIVED 50 MG OF BENADRYL IV-ARS 10/08/07   . Iodine Rash  . Latex Rash    Physical Exam: Constitutional: in no apparent distress and alert  Vitals:   05/11/19 1000 05/11/19 1100  BP: 118/65 126/67  Pulse: 75 78  Resp: 20 18  Temp:    SpO2: 93% 94%   EYES: anicteric ENMT: no thrush Cardiovascular: Cor RRR Respiratory: CTA B; normal respiratory effort GI: soft,nt Musculoskeletal: no edema Skin: negatives: no rash Neuro: non-focal  Lab Results  Component Value Date   WBC 3.4 (L) 05/10/2019   HGB 8.1 (L) 05/10/2019   HCT 27.1 (L) 05/10/2019   MCV 100.4 (H) 05/10/2019   PLT 79 (L) 05/10/2019    Lab Results  Component Value Date   CREATININE 5.17 (H) 05/10/2019   BUN 61 (H) 05/10/2019   NA 140 05/10/2019   K 4.4 05/10/2019   CL 101 05/10/2019   CO2 29 05/10/2019    Lab Results  Component Value Date   ALT 14 05/09/2019   AST 27 05/09/2019   ALKPHOS 65 05/09/2019     Microbiology: Recent Results (from the past 240 hour(s))  Novel Coronavirus, NAA ([facility])     Status: None   Collection Time: 05/03/19 12:00 AM   Specimen: Oropharyngeal(OP) collection in vial transport medium    OROPHARYNGEA  TESTING  Result Value Ref Range Status   SARS-CoV-2, NAA Not Detected Not Detected Final    Comment: Testing was performed using the cobas(R) SARS-CoV-2 test. This nucleic acid amplification test was developed and its performance characteristics determined by [employer] and [employer]. Nucleic acid amplification tests include PCR and TMA. This test has not been  FDA cleared or approved. This test has been authorized by FDA under an Emergency Use Authorization (EUA). This test is only authorized for the duration of time the declaration that circumstances exist justifying the authorization of the emergency use of in vitro diagnostic tests for detection of SARS-CoV-2 virus and/or diagnosis of COVID-19 infection under section 564(b)(1) of the Act, 21 U.S.C. [id number]) (1), unless the authorization is terminated or revoked sooner. When diagnostic testing is negative, the possibility of a false negative result should be considered in the context of a patient's recent exposures and the presence of clinical signs and symptoms consistent with COVID-19. An individual without symptoms  of COVID-19 and who is not shedding SARS-CoV-2 virus would expect to have a negative (not detected) result in this assay.   Urine culture     Status: Abnormal (Preliminary result)   Collection Time: 05/09/19  9:38 AM   Specimen: Urine, Clean Catch  Result Value Ref Range Status   Specimen Description   Final    URINE, CLEAN CATCH Performed at [facility], [address], [place], [place] [address]    Special Requests   Final    NONE Performed at [facility], [address], [place], [place] [address]    Culture (A)  Final    >=100,000 COLONIES/mL ENTEROCOCCUS FAECALIS SUSCEPTIBILITIES TO FOLLOW Performed at [facility] [address], [place], [place] [address]    Report Status PENDING  Incomplete  SARS CORONAVIRUS 2 (TAT 6-24 HRS) Nasopharyngeal Nasopharyngeal Swab     Status: Abnormal    Collection Time: 05/09/19 10:39 AM   Specimen: Nasopharyngeal Swab  Result Value Ref Range Status   SARS Coronavirus 2 POSITIVE (A) NEGATIVE Final    Comment: (NOTE) SARS-CoV-2 target nucleic acids are DETECTED. The SARS-CoV-2 RNA is generally detectable in upper and lower respiratory specimens during the acute phase of infection. Positive results are indicative of active infection with SARS-CoV-2. Clinical  correlation with patient history and other diagnostic information is necessary to determine patient infection status. Positive results do  not rule out bacterial infection or co-infection with other viruses. The expected result is Negative. Fact Sheet for Patients: SugarRoll.be Fact Sheet for Healthcare Providers: https://www.woods-mathews.com/ This test is not yet approved or cleared by the [place] FDA and  has been authorized for detection and/or diagnosis of SARS-CoV-2 by FDA under an Emergency Use Authorization (EUA). This EUA will remain  in effect (meaning this test can be used) for the duration of the COVID-19 declaration under Section 564(b)(1) of the Act, 21 U.S.C.  section 360bbb-3(b)(1), unless the authorization is terminated or revoked sooner. Performed at [facility], [place] [address] [place], [place] [address]   MRSA PCR Screening     Status: None   Collection Time: 05/11/19 12:37 AM   Specimen: Nasal Mucosa; Nasopharyngeal  Result Value Ref Range Status   MRSA by PCR NEGATIVE NEGATIVE Final    Comment:        The GeneXpert MRSA Assay (FDA approved for NASAL specimens only), is one component of a comprehensive MRSA colonization surveillance program. It is not intended to diagnose MRSA infection nor to guide or monitor treatment for MRSA infections. Performed at [facility], [place] [address] [place], [place] [address]     Robert W Comer, [facility] for Infectious Disease [facility] [email or web address] 05/11/2019, 11:52 AM

## 2019-05-11 NOTE — Progress Notes (Signed)
Patient ID: [name], male   DOB: [birth year], 53 y.o.   MRN: [id number] S: Feels better today, more awake and alert. O:BP 126/67   Pulse 78   Temp 99.1 F (37.3 C) (Oral)   Resp 18   Ht 6\' 3"  (1.905 m)   Wt (!) 196.2 kg   SpO2 94%   BMI 54.06 kg/m   Intake/Output Summary (Last 24 hours) at 05/11/2019 1443 Last data filed at 05/11/2019 0900 Gross per 24 hour  Intake 480 ml  Output 1425 ml  Net -945 ml   Intake/Output: I/O last 3 completed shifts: In: 1640 [P.O.:1440; IV Piggyback:200] Out: 2650 [Urine:2650]  Intake/Output this shift:  Total I/O In: -  Out: 175 [Urine:175] Weight change: 3.421 kg Gen: morbidly obese WM in NAD CVS: no rub Resp: decreased BS Abd: obese, +BS, soft Ext:  Trace presacral edema  Recent Labs  Lab 05/09/19 0959 05/10/19 0604 05/11/19 1237  NA 140 140 135  K 3.7 4.4 3.8  CL 99 101 97*  CO2 32 29 26  GLUCOSE 144* 181* 219*  BUN 57* 61* 67*  CREATININE 4.93* 5.17* 5.49*  ALBUMIN 2.4* 2.3*  --   CALCIUM 8.3* 8.3* 8.2*  PHOS  --  5.0*  --   AST 27  --   --   ALT 14  --   --    Liver Function Tests: Recent Labs  Lab 05/09/19 0959 05/10/19 0604  AST 27  --   ALT 14  --   ALKPHOS 65  --   BILITOT 0.8  --   PROT 6.8  --   ALBUMIN 2.4* 2.3*   No results for input(s): LIPASE, AMYLASE in the last 168 hours. No results for input(s): AMMONIA in the last 168 hours. CBC: Recent Labs  Lab 05/09/19 0959 05/10/19 0604  WBC 3.1* 3.4*  NEUTROABS 1.5*  --   HGB 8.2* 8.1*  HCT 27.9* 27.1*  MCV 101.1* 100.4*  PLT 77* 79*   Cardiac Enzymes: No results for input(s): CKTOTAL, CKMB, CKMBINDEX, TROPONINI in the last 168 hours. CBG: Recent Labs  Lab 05/10/19 1723 05/10/19 2023 05/10/19 2135 05/11/19 0859 05/11/19 1328  GLUCAP 212* 215* 217* 211* 219*    Iron Studies:  Recent Labs    05/09/19 1326  IRON 39*  TIBC 215*  FERRITIN 75   Studies/Results: US Renal  Result Date: 05/11/2019 CLINICAL DATA:  Acute renal  injury. EXAM: RENAL / URINARY TRACT ULTRASOUND COMPLETE COMPARISON:  Ultrasound 02/21/2019.  CT 12/28/2018. FINDINGS: Right Kidney: Renal measurements: 14.1 x 6.8 x 8.8 cm = volume: 441.5 mL . Echogenicity within normal limits. 3.1 cm complex right renal cyst. Although this appears smaller than noted on prior study, on today's exam a slightly thickened septation is noted within this cyst. No hydronephrosis visualized. Left Kidney: Not visualized. Atrophic left kidney noted on prior CT of 12/28/2018. Bladder: Appears normal for degree of bladder distention. Other: Exam limited due to patient's body habitus. IMPRESSION: 1. 3.1 cm complex right renal cyst is noted. Although this appears smaller than on prior study, on today's exam with slightly thickened septation is noted within the cyst. Further evaluation with renal MRI should be considered. 2.  Left renal atrophy. Electronically Signed   By: [place]   On: 05/11/2019 12:29   . amoxicillin  500 mg Oral Once  . busPIRone  15 mg Oral BID  . Chlorhexidine Gluconate Cloth  6 each Topical Daily  . cholecalciferol  1,000 Units Oral  Daily  . clomiPHENE  25 mg Oral QODAY  . DULoxetine  60 mg Oral Daily  . fentaNYL  1 patch Transdermal Q72H  . furosemide  40 mg Intravenous BID  . insulin aspart  0-9 Units Subcutaneous TID WC  . insulin glargine  10 Units Subcutaneous QHS  . lamoTRIgine  200 mg Oral BID  . levETIRAcetam  1,000 mg Oral BID  . pregabalin  50 mg Oral Q M,W,F  . simvastatin  20 mg Oral QHS  . tamsulosin  0.4 mg Oral Daily  . vitamin C  500 mg Oral Q6H  . warfarin  5 mg Oral Once  . Warfarin - Pharmacist Dosing Inpatient   Does not apply q1800  . zinc sulfate  220 mg Oral BID    BMET    Component Value Date/Time   NA 135 05/11/2019 1237   NA 141 01/24/2019 1229   K 3.8 05/11/2019 1237   CL 97 (L) 05/11/2019 1237   CO2 26 05/11/2019 1237   GLUCOSE 219 (H) 05/11/2019 1237   BUN 67 (H) 05/11/2019 1237   BUN 35 (H) 01/24/2019  1229   CREATININE 5.49 (H) 05/11/2019 1237   CREATININE 1.66 (H) 01/11/2013 1526   CALCIUM 8.2 (L) 05/11/2019 1237   GFRNONAA 11 (L) 05/11/2019 1237   GFRNONAA 49 (L) 01/11/2013 1526   GFRAA 13 (L) 05/11/2019 1237   GFRAA 56 (L) 01/11/2013 1526   CBC    Component Value Date/Time   WBC 3.4 (L) 05/10/2019 0604   RBC 2.70 (L) 05/10/2019 0604   HGB 8.1 (L) 05/10/2019 0604   HGB 13.1 01/24/2019 1229   HCT 27.1 (L) 05/10/2019 0604   HCT 39.6 01/24/2019 1229   PLT 79 (L) 05/10/2019 0604   PLT 103 (L) 01/24/2019 1229   MCV 100.4 (H) 05/10/2019 0604   MCV 89 01/24/2019 1229   MCH 30.0 05/10/2019 0604   MCHC 29.9 (L) 05/10/2019 0604   RDW 14.9 05/10/2019 0604   RDW 14.9 01/24/2019 1229   LYMPHSABS 1.1 05/09/2019 0959   LYMPHSABS 1.2 01/24/2019 1229   MONOABS 0.3 05/09/2019 0959   EOSABS 0.1 05/09/2019 0959   EOSABS 0.2 01/24/2019 1229   BASOSABS 0.0 05/09/2019 0959   BASOSABS 0.1 01/24/2019 1229     Assessment/Plan: 1.  AKI/CKD stage 4- presumably due to ischemic atn in setting of hypotension and urosepsis.  His BUN/Cr has cont to climb, however he is non-oliguric.  No uremic symptoms at this time and will hold off on CVVHD for now.  Hopefully his Cr will reach a plateau and start to trend toward his baseline Cr.  Continue to follow closely.   2. Sepsis- due to enterococcus UTI.  Blood cultures negative to date.  ID following.  Currently on aztreonam. 3. Covid+- per report was negative on 05/03/19 but I do not see any records of that.  Per ID he is beyond the 21 days of infection so respiratory isolation was discontinued this morning. 4. Pancytopenia- relatively stable since his last hospitalization with improvement of platelets.  5. Acute on chronic hypoxic respiratory failure- as above.  Per primary svc 6. CKD stage 4 due to DM, HTN, atrophic left kidney.  Baseline Scr has been 3.2-4.4.   7. DM- per primary svc 8. OSA/OHS- per primary    Donetta Potts, MD [facility] [phone]

## 2019-05-11 NOTE — Progress Notes (Signed)
ANTICOAGULATION CONSULT NOTE -   Pharmacy Consult for warfarin Indication: history of DVT/PE  Allergies  Allergen Reactions  . Bee Venom Anaphylaxis, Shortness Of Breath and Swelling  . Other Shortness Of Breath    Itching, rash with IVP DYE, iodine, shellfish LATEX  . Penicillins Anaphylaxis and Shortness Of Breath    Has patient had a PCN reaction causing immediate rash, facial/tongue/throat swelling, SOB or lightheadedness with hypotension: Yes Has patient had a PCN reaction causing severe rash involving mucus membranes or skin necrosis: No Has patient had a PCN reaction that required hospitalization No Has patient had a PCN reaction occurring within the last 10 years: No If all of the above answers are "NO", then may proceed with Cephalosporin use.   . Shellfish Allergy Nausea And Vomiting and Other (See Comments)    Feels like insides are twisting  . Iodinated Diagnostic Agents     Other reaction(s): RASH  . Iohexol      Code: RASH, Desc: PT WAS ON PREDNISONE FOR GOUT TX. @ TIME OF SCAN AND RECEIVED 50 MG OF BENADRYL IV-ARS 10/08/07   . Iodine Rash  . Latex Rash    Patient Measurements: Height: 6\' 3"  (190.5 cm) Weight: (!) 432 lb 8.7 oz (196.2 kg) IBW/kg (Calculated) : 84.5   Vital Signs: BP: 126/67 (11/12 1100) Pulse Rate: 78 (11/12 1100)  Labs: Recent Labs    05/09/19 0959 05/09/19 1326 05/10/19 0604 05/11/19 0932 05/11/19 1237  HGB 8.2*  --  8.1*  --   --   HCT 27.9*  --  27.1*  --   --   PLT 77*  --  79*  --   --   LABPROT  --  78.2* 56.5* 20.7*  --   INR  --  10.0* 6.6* 1.8*  --   CREATININE 4.93*  --  5.17*  --  5.49*    Estimated Creatinine Clearance: 28.8 mL/min (A) (by C-G formula based on SCr of 5.49 mg/dL (H)).   Medical History: Past Medical History:  Diagnosis Date  . Anemia   . Anxiety   . Arthritis   . Bipolar 1 disorder ([place])   . Bronchitis    hx of  . Bruises easily   . Chronic kidney disease    decreased left kidney fx  .  Chronic pain syndrome 05/11/2012  . Chronic respiratory failure with hypoxia (HCC)    And with hypercapnia  . Diabetes mellitus without complication ([place])   . Diabetic neuropathy ([place])   . DVT (deep venous thrombosis) (HCC)    LLE DVT ~ '12  . Dyspnea    with ambulation  . GERD (gastroesophageal reflux disease)   . History of 2019 novel coronavirus disease (COVID-19)   . HOH (hard of hearing) 2015   has hearing aids  . Mental disorder   . Migraine   . Neuromuscular disorder ([place])   . Obesity hypoventilation syndrome ([place])   . Obstructive sleep apnea    CPAP  . PE (pulmonary embolism)    bilateral PE ~ '11  . Seizures ([place])   . Thrombocytopenia ([place]) 05/11/2012    Assessment: Pharmacy consulted to dose warfarin in patient with history of DVT/PE.  Patient's INR on admission is >10. Patient given Vitamin K 2.5 mg IV on 11/10 and vitamin K 5mg  PO on 11/11. INR now below goal at 1.8. Pt with low Hg and PLT at baseline currently stable.   Given his initial presentation with very high INR and need  for reversal, his PTA dose of 5mg  was likely too high. Vitamin K reversal effects will persist over the next 48-72h so a higher re-initiation dose of warfarin is warranted.  Goal of Therapy:  INR 2-3 Monitor platelets by anticoagulation protocol: Yes   Plan:  Warfarin 5mg  PO x 1 dose. Monitor AM INR, LFTs, and s/s of bleeding, Hg, PLT  Onnie Boer; PharmD Candidate 05/11/2019 1:31 PM

## 2019-05-12 ENCOUNTER — Telehealth: Payer: Self-pay | Admitting: Endocrinology

## 2019-05-12 DIAGNOSIS — N189 Chronic kidney disease, unspecified: Secondary | ICD-10-CM

## 2019-05-12 DIAGNOSIS — E1165 Type 2 diabetes mellitus with hyperglycemia: Secondary | ICD-10-CM

## 2019-05-12 DIAGNOSIS — F339 Major depressive disorder, recurrent, unspecified: Secondary | ICD-10-CM

## 2019-05-12 DIAGNOSIS — D61818 Other pancytopenia: Secondary | ICD-10-CM

## 2019-05-12 DIAGNOSIS — N3090 Cystitis, unspecified without hematuria: Secondary | ICD-10-CM

## 2019-05-12 LAB — BASIC METABOLIC PANEL
Anion gap: 13 (ref 5–15)
BUN: 69 mg/dL — ABNORMAL HIGH (ref 6–20)
CO2: 26 mmol/L (ref 22–32)
Calcium: 7.9 mg/dL — ABNORMAL LOW (ref 8.9–10.3)
Chloride: 96 mmol/L — ABNORMAL LOW (ref 98–111)
Creatinine, Ser: 5.29 mg/dL — ABNORMAL HIGH (ref 0.61–1.24)
GFR calc Af Amer: 13 mL/min — ABNORMAL LOW (ref >60)
GFR calc non Af Amer: 11 mL/min — ABNORMAL LOW (ref >60)
Glucose, Bld: 267 mg/dL — ABNORMAL HIGH (ref 70–99)
Potassium: 3.8 mmol/L (ref 3.5–5.1)
Sodium: 135 mmol/L (ref 135–145)

## 2019-05-12 LAB — CBC WITH DIFFERENTIAL/PLATELET
Abs Immature Granulocytes: 0.04 10*3/uL (ref 0.00–0.07)
Basophils Absolute: 0 10*3/uL (ref 0.0–0.1)
Basophils Relative: 0 %
Eosinophils Absolute: 0.1 10*3/uL (ref 0.0–0.5)
Eosinophils Relative: 3 %
HCT: 22.6 % — ABNORMAL LOW (ref 39.0–52.0)
Hemoglobin: 7.1 g/dL — ABNORMAL LOW (ref 13.0–17.0)
Immature Granulocytes: 1 %
Lymphocytes Relative: 24 %
Lymphs Abs: 0.8 10*3/uL (ref 0.7–4.0)
MCH: 30.5 pg (ref 26.0–34.0)
MCHC: 31.4 g/dL (ref 30.0–36.0)
MCV: 97 fL (ref 80.0–100.0)
Monocytes Absolute: 0.2 10*3/uL (ref 0.1–1.0)
Monocytes Relative: 7 %
Neutro Abs: 2 10*3/uL (ref 1.7–7.7)
Neutrophils Relative %: 65 %
Platelets: 72 10*3/uL — ABNORMAL LOW (ref 150–400)
RBC: 2.33 MIL/uL — ABNORMAL LOW (ref 4.22–5.81)
RDW: 15.1 % (ref 11.5–15.5)
WBC: 3.1 10*3/uL — ABNORMAL LOW (ref 4.0–10.5)
nRBC: 0.6 % — ABNORMAL HIGH (ref 0.0–0.2)

## 2019-05-12 LAB — GLUCOSE, CAPILLARY
Glucose-Capillary: 238 mg/dL — ABNORMAL HIGH (ref 70–99)
Glucose-Capillary: 256 mg/dL — ABNORMAL HIGH (ref 70–99)
Glucose-Capillary: 274 mg/dL — ABNORMAL HIGH (ref 70–99)
Glucose-Capillary: 275 mg/dL — ABNORMAL HIGH (ref 70–99)

## 2019-05-12 LAB — PROTIME-INR
INR: 1.6 — ABNORMAL HIGH (ref 0.8–1.2)
Prothrombin Time: 19 seconds — ABNORMAL HIGH (ref 11.4–15.2)

## 2019-05-12 MED ORDER — WARFARIN SODIUM 5 MG PO TABS
5.0000 mg | ORAL_TABLET | Freq: Once | ORAL | Status: AC
  Administered 2019-05-12: 5 mg via ORAL
  Filled 2019-05-12: qty 1

## 2019-05-12 MED ORDER — INSULIN ASPART 100 UNIT/ML ~~LOC~~ SOLN
6.0000 [IU] | Freq: Three times a day (TID) | SUBCUTANEOUS | Status: DC
  Administered 2019-05-12 – 2019-05-14 (×6): 6 [IU] via SUBCUTANEOUS

## 2019-05-12 MED ORDER — WARFARIN SODIUM 5 MG PO TABS
5.0000 mg | ORAL_TABLET | Freq: Once | ORAL | Status: DC
  Filled 2019-05-12: qty 1

## 2019-05-12 MED ORDER — POLYETHYLENE GLYCOL 3350 17 G PO PACK: 17.0000 g | PACK | Freq: Every day | ORAL | Status: DC | PRN

## 2019-05-12 MED ORDER — AMOXICILLIN 500 MG PO CAPS
500.0000 mg | ORAL_CAPSULE | Freq: Two times a day (BID) | ORAL | Status: DC
  Filled 2019-05-12: qty 1

## 2019-05-12 MED ORDER — AMOXICILLIN 500 MG PO CAPS
500.0000 mg | ORAL_CAPSULE | Freq: Two times a day (BID) | ORAL | Status: DC
  Administered 2019-05-12 – 2019-05-15 (×7): 500 mg via ORAL
  Filled 2019-05-12 (×7): qty 1

## 2019-05-12 MED ORDER — SODIUM CHLORIDE 0.9 % IV BOLUS
500.0000 mL | Freq: Once | INTRAVENOUS | Status: AC
  Administered 2019-05-12: 500 mL via INTRAVENOUS

## 2019-05-12 MED ORDER — INSULIN GLARGINE 100 UNIT/ML ~~LOC~~ SOLN
18.0000 [IU] | Freq: Two times a day (BID) | SUBCUTANEOUS | Status: DC
  Administered 2019-05-12: 18 [IU] via SUBCUTANEOUS
  Filled 2019-05-12 (×4): qty 0.18

## 2019-05-12 MED ORDER — INSULIN GLARGINE 100 UNIT/ML ~~LOC~~ SOLN
18.0000 [IU] | Freq: Every day | SUBCUTANEOUS | Status: DC
  Filled 2019-05-12: qty 0.18

## 2019-05-12 MED ORDER — INSULIN ASPART 100 UNIT/ML ~~LOC~~ SOLN
0.0000 [IU] | Freq: Three times a day (TID) | SUBCUTANEOUS | Status: DC
  Administered 2019-05-12 – 2019-05-13 (×2): 11 [IU] via SUBCUTANEOUS
  Administered 2019-05-13 (×2): 7 [IU] via SUBCUTANEOUS
  Administered 2019-05-14: 4 [IU] via SUBCUTANEOUS
  Administered 2019-05-14: 8 [IU] via SUBCUTANEOUS
  Administered 2019-05-14 – 2019-05-15 (×2): 7 [IU] via SUBCUTANEOUS
  Administered 2019-05-15: 11 [IU] via SUBCUTANEOUS

## 2019-05-12 MED ORDER — DOCUSATE SODIUM 100 MG PO CAPS
100.0000 mg | ORAL_CAPSULE | Freq: Two times a day (BID) | ORAL | Status: DC
  Administered 2019-05-12 – 2019-05-15 (×6): 100 mg via ORAL
  Filled 2019-05-12 (×6): qty 1

## 2019-05-12 NOTE — Progress Notes (Signed)
[facility] Progress Note Patient Name: [name] DOB: 02/09/1966 MRN: [id number]   Date of Service  05/12/2019  HPI/Events of Note  Constipation  eICU Interventions  Colace 100 mg po bid, Miralax 17 gm po daily PRN constipation.        [name] 05/12/2019, 10:11 PM

## 2019-05-12 NOTE — Telephone Encounter (Signed)
Pt did not attend 04/03/19. Humalog was sent for 30 day supply at that time. Per Dr. Loanne Drilling, unable to refill Humalog without an appt. Routing this message to the front desk for scheduling purposes.

## 2019-05-12 NOTE — Progress Notes (Signed)
Pharmacist Penicillin Allergy Follow-Up  Mr. Scronce did well with his amoxicillin oral challenge. He did not experience any symptoms of anaphylaxis and we did not note any rashes or hives today.   I have deleted his penicillin allergy from his profile and discussed with his outpatient pharmacy, [facility] in [place].    Our pharmacy student, Terrence Dupont, also provided him a with a card noting that he is not penicillin allergic and counseled him to alert his providers and to let us know if any symptoms of allergy arise in the future.    Jimmy Footman, PharmD, BCPS, [place] Infectious Diseases Clinical Pharmacist Phone: [phone] 05/12/2019 4:26 PM

## 2019-05-12 NOTE — Progress Notes (Signed)
[facility]  PROGRESS NOTE  [name] [id number] DOB: [birth year] DOA: 05/09/2019 PCP: Patrick Balloon, [name] Admit date - 05/09/2019   Admitting Physician Patrick Eva, MD  Outpatient Primary MD for the patient is Patrick Balloon, [name]  LOS - 3 Brief Narrative   [name] is a 53 y.o. year old male with medical history significant for chronic disease stage IV, bipolar, chronic respiratory failure on 3 L of submental oxygen per nasal cannula, DVT/PE on coumadin,type 2 diabetes mellitus, OSA/OHS, pulmonary embolism, seizures, thrombocytopenia, dyslipidemia and chronic pain syndrome.  Recent hospitalization 10/11 through 10/27 for SARS COVID-19 pneumonia who presented on 05/09/2019 as transfer.  Initially admitted at [facility] for decreasing urinary output, blood in urine and urinary frequency and admitted with working diagnosis of Aki on CKD complicated by UTI. Due to high concern for need for CVVHD and worsening respiratory status and hemodynamics and was found to have working diagnosis of aki on CKD complicated by UTI and recent COVID-19 infection.  Aztreonam changed to daptomycin due to E.faeclis UTI and listed PCN anaphylactic allergy. Tolerated PCN challenge and now on oral amoxicillin.     Subjective  [name] today has a nose bleed from his oxygen. Feels breathing is better. No cough. No fevers. BP better today  A & P    1. NonoliguricAKI on CKD stage IV, slight improvement. Seems to be responding to IV diuresis as he has output of 1.5 L. Cr slightly down at 5.29 from 5.49 but still with GFR of 11 and need to continue to closely monitor in case he needs HD per nephrology.Stop IV lasix given hypotension.  CXR shows bilateral opacities may not necessarily be pulmonary edema. Baseline cr has been 3.6-4, on disharge on 10/27 it was 3.47, prior to transfer to [facility] it was 5.17. Likely ATN related to hypotension in setting of his UTI, nephrology following, high concern  may need CVV HD if renal function continues to worsen, serial BMP, avoid nephrotoxins, check renal ultrasound-does have BPH  2. Acute on chronic hypoxic respiratory failure, improving. Back on home o2 regimen of 3-4 L. CXR not consistent with pulmonary edema likely infiltrates from prior COVID. Not a new COVID infection. Dc diuretics, monitor respiratory status.  3. Hypotension, improved. BP in 120s after small bolus this am. Likely related to ongoing diuresis, dc diurectics and monitor. Will transfer to floor given BP stable without lactic acidosis.  4. Sepsis, secondary to E. Faecalis UTI, stable. Sepsis physiology resolved. Tolerated oral challenge, does no have anaphylactic PCN allergy. Dc dapto start amoxicillin, ID following and agrees.   5. COVID + test. Initially positive 04/09/19. Negative on 05/03/19. Again positive on 05/09/19. 11/4 test not accurate, discussed with ID will take months from initial test to not be positive. Not a new infection. Isolation precautions no longer necessary  6. Acute on Chronic Pancytopenia. Baseline hgb of 10, currently 7.2 but platelet count is improved from prior Goal hgb >7, and plt > 10(as long as not bleeding), transfuse to maintain goals  7. PE, complicated by supratherapetuic INR (peak of 10, S/p vitamin K, INR 6). Now subtherapeutic, pharmacy managing coumadin, avoiding heparin given high risk for bleed.   8. T2DM. BG in high 200s will increase lantus 20 U bid ( lower than home dose), hold home jardiance  9. OSA/OHS. Back tohome regimen of 3-4L, not on CPAP at home  10. Depression and Bipolar disorder, stable. Continue home buspar, , clomiphene, cymbalata  11. Chronic  pain syndrome, stable. Continue home flexeril PRN, lyrica, PRN norco. Held home fentanyl patch  12. Seizure disorder, stable. Continue home keppra and lamictal  13. Hyperlipidemia, stable. Continue home simvastatin  14. BPH, stable. Continue home flomax     Family  Communication  :  None at bedside, sopoke with [name] at [phone] on 05/11/19, will update today  Code Status :  FULL  Disposition Plan  :  Monitoring kidney function , respiratory/oxygen status,   Consults  :  Nephrology, ID,   Procedures  :  none  DVT Prophylaxis  :  Heparin  Lab Results  Component Value Date   PLT 72 (L) 05/12/2019    Diet :  Diet Order            Diet renal with fluid restriction Fluid restriction: 1200 mL Fluid; Room service appropriate? Yes; Fluid consistency: Thin  Diet effective now               Inpatient Medications Scheduled Meds: . busPIRone  15 mg Oral BID  . Chlorhexidine Gluconate Cloth  6 each Topical Daily  . cholecalciferol  1,000 Units Oral Daily  . clomiPHENE  25 mg Oral QODAY  . DULoxetine  60 mg Oral Daily  . fentaNYL  1 patch Transdermal Q72H  . furosemide  40 mg Intravenous BID  . insulin aspart  0-9 Units Subcutaneous TID WC  . insulin glargine  10 Units Subcutaneous QHS  . lamoTRIgine  200 mg Oral BID  . levETIRAcetam  1,000 mg Oral BID  . pregabalin  50 mg Oral Q M,W,F  . simvastatin  20 mg Oral QHS  . tamsulosin  0.4 mg Oral Daily  . vitamin C  500 mg Oral Q6H  . Warfarin - Pharmacist Dosing Inpatient   Does not apply q1800  . zinc sulfate  220 mg Oral BID   Continuous Infusions: . sodium chloride     PRN Meds:.acetaminophen **OR** acetaminophen, cyclobenzaprine, diphenhydrAMINE, EPINEPHrine, HYDROcodone-acetaminophen, methylPREDNISolone (SOLU-MEDROL) injection, ondansetron **OR** ondansetron (ZOFRAN) IV, sodium chloride  Antibiotics  :   Anti-infectives (From admission, onward)   Start     Dose/Rate Route Frequency Ordered Stop   05/11/19 1400  amoxicillin (AMOXIL) capsule 500 mg     500 mg Oral  Once 05/11/19 1223 05/11/19 1531   05/11/19 1300  DAPTOmycin (CUBICIN) 700 mg in sodium chloride 0.9 % IVPB  Status:  Discontinued     700 mg 228 mL/hr over 30 Minutes Intravenous Daily 05/11/19 1207 05/11/19  1218   05/09/19 2000  aztreonam (AZACTAM) 1 g in sodium chloride 0.9 % 100 mL IVPB  Status:  Discontinued     1 g 200 mL/hr over 30 Minutes Intravenous Every 8 hours 05/09/19 1348 05/11/19 1105   05/09/19 1145  aztreonam (AZACTAM) 1 g in sodium chloride 0.9 % 100 mL IVPB     1 g 200 mL/hr over 30 Minutes Intravenous  Once 05/09/19 1144 05/09/19 1320       Objective   Vitals:   05/11/19 2045 05/11/19 2100 05/11/19 2341 05/12/19 0338  BP: (!) 95/51 (!) 98/45    Pulse:      Resp:      Temp:   98.1 F (36.7 C) 98.1 F (36.7 C)  TempSrc:   Oral Oral  SpO2:      Weight:      Height:        SpO2: 90 % O2 Flow Rate (L/min): 6 L/min  Wt Readings from Last  3 Encounters:  05/11/19 (!) 196.2 kg  04/23/19 (!) 189.1 kg  02/08/19 (!) 205.2 kg     Intake/Output Summary (Last 24 hours) at 05/12/2019 0729 Last data filed at 05/12/2019 0000 Gross per 24 hour  Intake 257.07 ml  Output 1525 ml  Net -1267.93 ml    Physical Exam:  Awake Alert, Oriented X 3, Normal affect, obese male Dry blood in nares No new F.N deficits,  Difficult to auscultate due to body habitus, on 4 L Haakon in no respiratory distress RRR,No Gallops,Rubs or new Murmurs,  +ve B.Sounds, Abd Soft, No tenderness, No organomegaly appreciated, No rebound, guarding or rigidity. No Cyanosis, Clubbing or edema, No new Rash or bruise     I have personally reviewed the following:   Data Reviewed:  CBC Recent Labs  Lab 05/09/19 0959 05/10/19 0604 05/12/19 0427  WBC 3.1* 3.4* 3.1*  HGB 8.2* 8.1* 7.1*  HCT 27.9* 27.1* 22.6*  PLT 77* 79* 72*  MCV 101.1* 100.4* 97.0  MCH 29.7 30.0 30.5  MCHC 29.4* 29.9* 31.4  RDW 15.0 14.9 15.1  LYMPHSABS 1.1  --  0.8  MONOABS 0.3  --  0.2  EOSABS 0.1  --  0.1  BASOSABS 0.0  --  0.0    Chemistries  Recent Labs  Lab 05/09/19 0959 05/10/19 0604 05/11/19 1237 05/12/19 0427  NA 140 140 135 135  K 3.7 4.4 3.8 3.8  CL 99 101 97* 96*  CO2 32 29 26 26   GLUCOSE 144* 181*  219* 267*  BUN 57* 61* 67* 69*  CREATININE 4.93* 5.17* 5.49* 5.29*  CALCIUM 8.3* 8.3* 8.2* 7.9*  AST 27  --   --   --   ALT 14  --   --   --   ALKPHOS 65  --   --   --   BILITOT 0.8  --   --   --    ------------------------------------------------------------------------------------------------------------------ No results for input(s): CHOL, HDL, LDLCALC, TRIG, CHOLHDL, LDLDIRECT in the last 72 hours.  Lab Results  Component Value Date   HGBA1C 7.9 (H) 04/10/2019   ------------------------------------------------------------------------------------------------------------------ No results for input(s): TSH, T4TOTAL, T3FREE, THYROIDAB in the last 72 hours.  Invalid input(s): FREET3 ------------------------------------------------------------------------------------------------------------------ Recent Labs    05/09/19 0959 05/09/19 1326  VITAMINB12  --  1,212*  FOLATE  --  6.4  FERRITIN  --  75  TIBC  --  215*  IRON  --  39*  RETICCTPCT 2.9  --     Coagulation profile Recent Labs  Lab 05/09/19 1326 05/10/19 0604 05/11/19 0932 05/12/19 0427  INR 10.0* 6.6* 1.8* 1.6*    No results for input(s): DDIMER in the last 72 hours.  Cardiac Enzymes No results for input(s): CKMB, TROPONINI, MYOGLOBIN in the last 168 hours.  Invalid input(s): CK ------------------------------------------------------------------------------------------------------------------    Component Value Date/Time   BNP 137.9 (H) 04/24/2019 0145    Micro Results Recent Results (from the past 240 hour(s))  Novel Coronavirus, NAA ([facility])     Status: None   Collection Time: 05/03/19 12:00 AM   Specimen: Oropharyngeal(OP) collection in vial transport medium   OROPHARYNGEA  TESTING  Result Value Ref Range Status   SARS-CoV-2, NAA Not Detected Not Detected Final    Comment: Testing was performed using the cobas(R) SARS-CoV-2 test. This nucleic acid amplification test was developed and its  performance characteristics determined by [employer]. Nucleic acid amplification tests include PCR and TMA. This test has not been FDA cleared or approved. This test  has been authorized by FDA under an Emergency Use Authorization (EUA). This test is only authorized for the duration of time the declaration that circumstances exist justifying the authorization of the emergency use of in vitro diagnostic tests for detection of SARS-CoV-2 virus and/or diagnosis of COVID-19 infection under section 564(b)(1) of the Act, 21 U.S.C. [id number]) (1), unless the authorization is terminated or revoked sooner. When diagnostic testing is negative, the possibility of a false negative result should be considered in the context of a patient's recent exposures and the presence of clinical signs and symptoms consistent with COVID-19. An individual without symptoms  of COVID-19 and who is not shedding SARS-CoV-2 virus would expect to have a negative (not detected) result in this assay.   Urine culture     Status: Abnormal   Collection Time: 05/09/19  9:38 AM   Specimen: Urine, Clean Catch  Result Value Ref Range Status   Specimen Description   Final    URINE, CLEAN CATCH Performed at [facility], [address] [place], [place] [address]    Special Requests   Final    NONE Performed at [facility], [address] [place], [place] [address]    Culture >=100,000 COLONIES/mL ENTEROCOCCUS FAECALIS (A)  Final   Report Status 05/11/2019 FINAL  Final   Organism ID, Bacteria ENTEROCOCCUS FAECALIS (A)  Final      Susceptibility   Enterococcus faecalis - MIC*    AMPICILLIN <=2 SENSITIVE Sensitive     LEVOFLOXACIN 1 SENSITIVE Sensitive     NITROFURANTOIN <=16 SENSITIVE Sensitive     VANCOMYCIN 1 SENSITIVE Sensitive     * >=100,000 COLONIES/mL ENTEROCOCCUS FAECALIS  SARS CORONAVIRUS 2 (TAT 6-24 HRS) Nasopharyngeal Nasopharyngeal Swab     Status: Abnormal   Collection Time: 05/09/19 10:39 AM    Specimen: Nasopharyngeal Swab  Result Value Ref Range Status   SARS Coronavirus 2 POSITIVE (A) NEGATIVE Final    Comment: (NOTE) SARS-CoV-2 target nucleic acids are DETECTED. The SARS-CoV-2 RNA is generally detectable in upper and lower respiratory specimens during the acute phase of infection. Positive results are indicative of active infection with SARS-CoV-2. Clinical  correlation with patient history and other diagnostic information is necessary to determine patient infection status. Positive results do  not rule out bacterial infection or co-infection with other viruses. The expected result is Negative. Fact Sheet for Patients: SugarRoll.be Fact Sheet for Healthcare Providers: https://www.woods-mathews.com/ This test is not yet approved or cleared by the [place] FDA and  has been authorized for detection and/or diagnosis of SARS-CoV-2 by FDA under an Emergency Use Authorization (EUA). This EUA will remain  in effect (meaning this test can be used) for the duration of the COVID-19 declaration under Section 564(b)(1) of the Act, 21 U.S.C.  section 360bbb-3(b)(1), unless the authorization is terminated or revoked sooner. Performed at [facility], [place] [address], [place], [place] [address]   MRSA PCR Screening     Status: None   Collection Time: 05/11/19 12:37 AM   Specimen: Nasal Mucosa; Nasopharyngeal  Result Value Ref Range Status   MRSA by PCR NEGATIVE NEGATIVE Final    Comment:        The GeneXpert MRSA Assay (FDA approved for NASAL specimens only), is one component of a comprehensive MRSA colonization surveillance program. It is not intended to diagnose MRSA infection nor to guide or monitor treatment for MRSA infections. Performed at [facility], [place] [address] [place],  [address]  Radiology Reports US Renal  Result Date: 05/11/2019 CLINICAL DATA:  Acute renal injury. EXAM: RENAL / URINARY  TRACT ULTRASOUND COMPLETE COMPARISON:  Ultrasound 02/21/2019.  CT 12/28/2018. FINDINGS: Right Kidney: Renal measurements: 14.1 x 6.8 x 8.8 cm = volume: 441.5 mL . Echogenicity within normal limits. 3.1 cm complex right renal cyst. Although this appears smaller than noted on prior study, on today's exam a slightly thickened septation is noted within this cyst. No hydronephrosis visualized. Left Kidney: Not visualized. Atrophic left kidney noted on prior CT of 12/28/2018. Bladder: Appears normal for degree of bladder distention. Other: Exam limited due to patient's body habitus. IMPRESSION: 1. 3.1 cm complex right renal cyst is noted. Although this appears smaller than on prior study, on today's exam with slightly thickened septation is noted within the cyst. Further evaluation with renal MRI should be considered. 2.  Left renal atrophy. Electronically Signed   By: Marcello Moores  Register   On: 05/11/2019 12:29   Dg Chest Port 1 View  Result Date: 05/11/2019 CLINICAL DATA:  53 year old male with decreased urination and hematuria. History of COVID-19. EXAM: PORTABLE CHEST 1 VIEW COMPARISON:  Chest radiograph dated 04/19/2019. FINDINGS: Bilateral peripheral and subpleural airspace densities similar or minimally improved. No new consolidative changes. There is no pleural effusion or pneumothorax. Stable cardiomegaly. No acute osseous pathology. IMPRESSION: Bilateral peripheral and subpleural airspace densities similar or minimally improved compared to the prior radiograph. Electronically Signed   By: Anner Crete M.D.   On: 05/11/2019 14:58   Dg Chest Port 1 View  Result Date: 04/19/2019 CLINICAL DATA:  Hypoxia. COVID-19 pneumonia. EXAM: PORTABLE CHEST 1 VIEW COMPARISON:  One-view chest x-ray 04/09/2019 FINDINGS: Peripheral airspace opacities are again seen bilaterally. Greatest densities in the right upper lobe. Overall aeration is improved. IMPRESSION: 1. Shifting appearance of extensive bilateral airspace  disease compatible with pneumonia. 2. Overall aeration is improved with some residual disease in the right upper lobe. Electronically Signed   By: San Morelle M.D.   On: 04/19/2019 12:37     Time Spent in minutes  30     Desiree Hane M.D on 05/12/2019 at 7:29 AM  To page go to [email or web address] - password [email or web address]

## 2019-05-12 NOTE — Progress Notes (Signed)
Inpatient Diabetes Program Recommendations  AACE/ADA: New Consensus Statement on Inpatient Glycemic Control (2015)  Target Ranges:  Prepandial:   less than 140 mg/dL      Peak postprandial:   less than 180 mg/dL (1-2 hours)      Critically ill patients:  140 - 180 mg/dL   Lab Results  Component Value Date   GLUCAP 256 (H) 05/12/2019   HGBA1C 7.9 (H) 04/10/2019    Review of Glycemic Control Results for ZEVI, MARMOL (MRN [id number]) as of 05/12/2019 13:06  Ref. Range 05/11/2019 17:23 05/11/2019 19:30 05/12/2019 07:42 05/12/2019 11:44  Glucose-Capillary Latest Ref Range: 70 - 99 mg/dL 229 (H) 246 (H) 238 (H) 256 (H)   Diabetes history: DM 2 Outpatient Diabetes medications:  Toujeo 60 units bid (per Medication reconciliation patient was taking up to 200 units daily) Humalog 25 units tid with meals (patient taking up to 50 units tid with meals) Current orders for Inpatient glycemic control:  Lantus 18 units daily, Novolog resistant tid with meals Inpatient Diabetes Program Recommendations:   Please increase Lantus to 20 units bid.  Also please add Novolog meal coverage 6 units tid with meals (hold if patient eats less than 50%).   Thanks  Adah Perl, RN, BC-ADM Inpatient Diabetes Coordinator Pager [phone] (8a-5p)

## 2019-05-12 NOTE — Progress Notes (Signed)
[facility] for Infectious Disease   Reason for visit: Follow up on urinary infection  Interval History: he received an oral challenge for his penicillin allergy history and no issues.  He remains afebrile.  No new complaints.  Feels better since admission.  No associated rash or diarrhea.    Physical Exam: Constitutional:  Vitals:   05/12/19 0900 05/12/19 0930  BP: (!) 90/45 (!) 114/53  Pulse: 75 75  Resp: 19 18  Temp:    SpO2: 93% 96%   patient appears in NAD, sleepy but arousable Eyes: anicteric Respiratory: Normal respiratory effort; CTA B Cardiovascular: RRR GI: soft, nt, nd  Review of Systems: Constitutional: negative for fevers and chills Gastrointestinal: negative for nausea and diarrhea Integument/breast: negative for rash  Lab Results  Component Value Date   WBC 3.1 (L) 05/12/2019   HGB 7.1 (L) 05/12/2019   HCT 22.6 (L) 05/12/2019   MCV 97.0 05/12/2019   PLT 72 (L) 05/12/2019    Lab Results  Component Value Date   CREATININE 5.29 (H) 05/12/2019   BUN 69 (H) 05/12/2019   NA 135 05/12/2019   K 3.8 05/12/2019   CL 96 (L) 05/12/2019   CO2 26 05/12/2019    Lab Results  Component Value Date   ALT 14 05/09/2019   AST 27 05/09/2019   ALKPHOS 65 05/09/2019     Microbiology: Recent Results (from the past 240 hour(s))  Novel Coronavirus, NAA ([facility])     Status: None   Collection Time: 05/03/19 12:00 AM   Specimen: Oropharyngeal(OP) collection in vial transport medium   OROPHARYNGEA  TESTING  Result Value Ref Range Status   SARS-CoV-2, NAA Not Detected Not Detected Final    Comment: Testing was performed using the cobas(R) SARS-CoV-2 test. This nucleic acid amplification test was developed and its performance characteristics determined by [employer]. Nucleic acid amplification tests include PCR and TMA. This test has not been FDA cleared or approved. This test has been authorized by FDA under an Emergency Use Authorization (EUA). This  test is only authorized for the duration of time the declaration that circumstances exist justifying the authorization of the emergency use of in vitro diagnostic tests for detection of SARS-CoV-2 virus and/or diagnosis of COVID-19 infection under section 564(b)(1) of the Act, 21 U.S.C. [id number]) (1), unless the authorization is terminated or revoked sooner. When diagnostic testing is negative, the possibility of a false negative result should be considered in the context of a patient's recent exposures and the presence of clinical signs and symptoms consistent with COVID-19. An individual without symptoms  of COVID-19 and who is not shedding SARS-CoV-2 virus would expect to have a negative (not detected) result in this assay.   Urine culture     Status: Abnormal   Collection Time: 05/09/19  9:38 AM   Specimen: Urine, Clean Catch  Result Value Ref Range Status   Specimen Description   Final    URINE, CLEAN CATCH Performed at [facility], [address] [place], [place] [address]    Special Requests   Final    NONE Performed at [facility], [address], [place], [place] [address]    Culture >=100,000 COLONIES/mL ENTEROCOCCUS FAECALIS (A)  Final   Report Status 05/11/2019 FINAL  Final   Organism ID, Bacteria ENTEROCOCCUS FAECALIS (A)  Final      Susceptibility   Enterococcus faecalis - MIC*    AMPICILLIN <=2 SENSITIVE Sensitive     LEVOFLOXACIN 1 SENSITIVE Sensitive  NITROFURANTOIN <=16 SENSITIVE Sensitive     VANCOMYCIN 1 SENSITIVE Sensitive     * >=100,000 COLONIES/mL ENTEROCOCCUS FAECALIS  SARS CORONAVIRUS 2 (TAT 6-24 HRS) Nasopharyngeal Nasopharyngeal Swab     Status: Abnormal   Collection Time: 05/09/19 10:39 AM   Specimen: Nasopharyngeal Swab  Result Value Ref Range Status   SARS Coronavirus 2 POSITIVE (A) NEGATIVE Final    Comment: (NOTE) SARS-CoV-2 target nucleic acids are DETECTED. The SARS-CoV-2 RNA is generally detectable in upper and lower respiratory  specimens during the acute phase of infection. Positive results are indicative of active infection with SARS-CoV-2. Clinical  correlation with patient history and other diagnostic information is necessary to determine patient infection status. Positive results do  not rule out bacterial infection or co-infection with other viruses. The expected result is Negative. Fact Sheet for Patients: SugarRoll.be Fact Sheet for Healthcare Providers: https://www.woods-mathews.com/ This test is not yet approved or cleared by the [place] FDA and  has been authorized for detection and/or diagnosis of SARS-CoV-2 by FDA under an Emergency Use Authorization (EUA). This EUA will remain  in effect (meaning this test can be used) for the duration of the COVID-19 declaration under Section 564(b)(1) of the Act, 21 U.S.C.  section 360bbb-3(b)(1), unless the authorization is terminated or revoked sooner. Performed at [facility], [place] [address] [place], [place] [address]   MRSA PCR Screening     Status: None   Collection Time: 05/11/19 12:37 AM   Specimen: Nasal Mucosa; Nasopharyngeal  Result Value Ref Range Status   MRSA by PCR NEGATIVE NEGATIVE Final    Comment:        The GeneXpert MRSA Assay (FDA approved for NASAL specimens only), is one component of a comprehensive MRSA colonization surveillance program. It is not intended to diagnose MRSA infection nor to guide or monitor treatment for MRSA infections. Performed at [facility], [place] [address] [place], [place] [address]     Impression/Plan:  1.  UTI - symptoms mainly of cystitis.  No systemic issues.  Will treat for 5 days with amoxicillin.  Stop date placed.  2.  Penicillin allergy - he tolerated the oral challenge so his allergy history will be removed and he can take penicillins.    3.  History of COVID - he is past the window of infectivity, though PCR will remain positive for  weeks going forward.  No indication for isolation  Will sign off, thanks for consultation

## 2019-05-12 NOTE — Progress Notes (Signed)
ANTICOAGULATION CONSULT NOTE -   Pharmacy Consult for warfarin Indication: history of DVT/PE  Allergies  Allergen Reactions  . Bee Venom Anaphylaxis, Shortness Of Breath and Swelling  . Other Shortness Of Breath    Itching, rash with IVP DYE, iodine, shellfish LATEX  . Penicillins Anaphylaxis and Shortness Of Breath    Has patient had a PCN reaction causing immediate rash, facial/tongue/throat swelling, SOB or lightheadedness with hypotension: Yes Has patient had a PCN reaction causing severe rash involving mucus membranes or skin necrosis: No Has patient had a PCN reaction that required hospitalization No Has patient had a PCN reaction occurring within the last 10 years: No If all of the above answers are "NO", then may proceed with Cephalosporin use.   . Shellfish Allergy Nausea And Vomiting and Other (See Comments)    Feels like insides are twisting  . Iodinated Diagnostic Agents     Other reaction(s): RASH  . Iohexol      Code: RASH, Desc: PT WAS ON PREDNISONE FOR GOUT TX. @ TIME OF SCAN AND RECEIVED 50 MG OF BENADRYL IV-ARS 10/08/07   . Iodine Rash  . Latex Rash    Patient Measurements: Height: 6\' 3"  (190.5 cm) Weight: (!) 432 lb 8.7 oz (196.2 kg) IBW/kg (Calculated) : 84.5   Vital Signs: Temp: 98.1 F (36.7 C) (11/13 0338) Temp Source: Oral (11/13 0338) BP: 98/45 (11/12 2100)  Labs: Recent Labs    05/09/19 0959  05/10/19 0604 05/11/19 0932 05/11/19 1237 05/12/19 0427  HGB 8.2*  --  8.1*  --   --  7.1*  HCT 27.9*  --  27.1*  --   --  22.6*  PLT 77*  --  79*  --   --  72*  LABPROT  --    < > 56.5* 20.7*  --  19.0*  INR  --    < > 6.6* 1.8*  --  1.6*  CREATININE 4.93*  --  5.17*  --  5.49* 5.29*   < > = values in this interval not displayed.    Estimated Creatinine Clearance: 29.9 mL/min (A) (by C-G formula based on SCr of 5.29 mg/dL (H)).   Medical History: Past Medical History:  Diagnosis Date  . Anemia   . Anxiety   . Arthritis   . Bipolar 1  disorder ([place])   . Bronchitis    hx of  . Bruises easily   . Chronic kidney disease    decreased left kidney fx  . Chronic pain syndrome 05/11/2012  . Chronic respiratory failure with hypoxia (HCC)    And with hypercapnia  . Diabetes mellitus without complication ([place])   . Diabetic neuropathy ([place])   . DVT (deep venous thrombosis) (HCC)    LLE DVT ~ '12  . Dyspnea    with ambulation  . GERD (gastroesophageal reflux disease)   . History of 2019 novel coronavirus disease (COVID-19)   . HOH (hard of hearing) 2015   has hearing aids  . Mental disorder   . Migraine   . Neuromuscular disorder ([place])   . Obesity hypoventilation syndrome ([place])   . Obstructive sleep apnea    CPAP  . PE (pulmonary embolism)    bilateral PE ~ '11  . Seizures ([place])   . Thrombocytopenia ([place]) 05/11/2012    Assessment: Pharmacy consulted to dose warfarin in patient with history of DVT/PE.  Patient's INR on admission is >10. Patient given Vitamin K 2.5 mg IV on 11/10 and vitamin K 5mg   PO on 11/11. Pt re-started on warfarin 5mg  x 1 dose 11/12. INR remains below goal at 1.6.   Pt with low Hg and PLT at baseline. Hg dropped from 8.1 to 7.1 Patient and nursing deny any signs or symptoms of bleeding this AM.   Given his initial presentation with very high INR and need for reversal, his PTA dose of 5mg  was likely too high. Vitamin K reversal effects will persist over the next 24-48h, so slight decrease in INR despite administration of warfarin 5mg  is expected.   Goal of Therapy:  INR 2-3 Monitor platelets by anticoagulation protocol: Yes   Plan:  Warfarin 5mg  PO x 1 dose. Monitor AM INR, LFTs, and s/s of bleeding, further drop in Hg, PLT  Onnie Boer; PharmD Candidate 05/12/2019 7:25 AM

## 2019-05-12 NOTE — Progress Notes (Signed)
Patient ID: [name], male   DOB: 1965/09/05, 53 y.o.   MRN: [id number] S: Groggy, awakens and no complaints O:BP (!) 114/53   Pulse 75   Temp 98.6 F (37 C) (Oral)   Resp 18   Ht 6\' 3"  (1.905 m)   Wt (!) 196.2 kg   SpO2 96%   BMI 54.06 kg/m   Intake/Output Summary (Last 24 hours) at 05/12/2019 1218 Last data filed at 05/12/2019 0900 Gross per 24 hour  Intake 896.1 ml  Output 2135 ml  Net -1238.9 ml   Intake/Output: I/O last 3 completed shifts: In: 977.1 [P.O.:720; IV Piggyback:257.1] Out: 2910 [Urine:2910]  Intake/Output this shift:  Total I/O In: 159 [IV Piggyback:159] Out: -  Weight change:  Gen: morbidly obese WM in NAD CVS: no rub Resp: decreased BS Abd: obese, +BS, soft Ext:  Trace presacral edema  Recent Labs  Lab 05/09/19 0959 05/10/19 0604 05/11/19 1237 05/12/19 0427  NA 140 140 135 135  K 3.7 4.4 3.8 3.8  CL 99 101 97* 96*  CO2 32 29 26 26   GLUCOSE 144* 181* 219* 267*  BUN 57* 61* 67* 69*  CREATININE 4.93* 5.17* 5.49* 5.29*  ALBUMIN 2.4* 2.3*  --   --   CALCIUM 8.3* 8.3* 8.2* 7.9*  PHOS  --  5.0*  --   --   AST 27  --   --   --   ALT 14  --   --   --    Liver Function Tests: Recent Labs  Lab 05/09/19 0959 05/10/19 0604  AST 27  --   ALT 14  --   ALKPHOS 65  --   BILITOT 0.8  --   PROT 6.8  --   ALBUMIN 2.4* 2.3*   No results for input(s): LIPASE, AMYLASE in the last 168 hours. No results for input(s): AMMONIA in the last 168 hours. CBC: Recent Labs  Lab 05/09/19 0959 05/10/19 0604 05/12/19 0427  WBC 3.1* 3.4* 3.1*  NEUTROABS 1.5*  --  2.0  HGB 8.2* 8.1* 7.1*  HCT 27.9* 27.1* 22.6*  MCV 101.1* 100.4* 97.0  PLT 77* 79* 72*   Cardiac Enzymes: No results for input(s): CKTOTAL, CKMB, CKMBINDEX, TROPONINI in the last 168 hours. CBG: Recent Labs  Lab 05/11/19 1328 05/11/19 1723 05/11/19 1930 05/12/19 0742 05/12/19 1144  GLUCAP 219* 229* 246* 238* 256*    Iron Studies:  Recent Labs    05/09/19 1326  IRON 39*   TIBC 215*  FERRITIN 75   Studies/Results: US Renal  Result Date: 05/11/2019 CLINICAL DATA:  Acute renal injury. EXAM: RENAL / URINARY TRACT ULTRASOUND COMPLETE COMPARISON:  Ultrasound 02/21/2019.  CT 12/28/2018. FINDINGS: Right Kidney: Renal measurements: 14.1 x 6.8 x 8.8 cm = volume: 441.5 mL . Echogenicity within normal limits. 3.1 cm complex right renal cyst. Although this appears smaller than noted on prior study, on today's exam a slightly thickened septation is noted within this cyst. No hydronephrosis visualized. Left Kidney: Not visualized. Atrophic left kidney noted on prior CT of 12/28/2018. Bladder: Appears normal for degree of bladder distention. Other: Exam limited due to patient's body habitus. IMPRESSION: 1. 3.1 cm complex right renal cyst is noted. Although this appears smaller than on prior study, on today's exam with slightly thickened septation is noted within the cyst. Further evaluation with renal MRI should be considered. 2.  Left renal atrophy. Electronically Signed   By: [place]   On: 05/11/2019 12:29   Dg Chest [facility]  1 View  Result Date: 05/11/2019 CLINICAL DATA:  53 year old male with decreased urination and hematuria. History of COVID-19. EXAM: PORTABLE CHEST 1 VIEW COMPARISON:  Chest radiograph dated 04/19/2019. FINDINGS: Bilateral peripheral and subpleural airspace densities similar or minimally improved. No new consolidative changes. There is no pleural effusion or pneumothorax. Stable cardiomegaly. No acute osseous pathology. IMPRESSION: Bilateral peripheral and subpleural airspace densities similar or minimally improved compared to the prior radiograph. Electronically Signed   By: Anner Crete M.D.   On: 05/11/2019 14:58   . amoxicillin  500 mg Oral Q12H  . busPIRone  15 mg Oral BID  . Chlorhexidine Gluconate Cloth  6 each Topical Daily  . cholecalciferol  1,000 Units Oral Daily  . clomiPHENE  25 mg Oral QODAY  . DULoxetine  60 mg Oral Daily  .  fentaNYL  1 patch Transdermal Q72H  . insulin aspart  0-9 Units Subcutaneous TID WC  . insulin glargine  10 Units Subcutaneous QHS  . lamoTRIgine  200 mg Oral BID  . levETIRAcetam  1,000 mg Oral BID  . pregabalin  50 mg Oral Q M,W,F  . simvastatin  20 mg Oral QHS  . tamsulosin  0.4 mg Oral Daily  . vitamin C  500 mg Oral Q6H  . warfarin  5 mg Oral ONCE-1800  . Warfarin - Pharmacist Dosing Inpatient   Does not apply q1800  . zinc sulfate  220 mg Oral BID    BMET    Component Value Date/Time   NA 135 05/12/2019 0427   NA 141 01/24/2019 1229   K 3.8 05/12/2019 0427   CL 96 (L) 05/12/2019 0427   CO2 26 05/12/2019 0427   GLUCOSE 267 (H) 05/12/2019 0427   BUN 69 (H) 05/12/2019 0427   BUN 35 (H) 01/24/2019 1229   CREATININE 5.29 (H) 05/12/2019 0427   CREATININE 1.66 (H) 01/11/2013 1526   CALCIUM 7.9 (L) 05/12/2019 0427   GFRNONAA 11 (L) 05/12/2019 0427   GFRNONAA 49 (L) 01/11/2013 1526   GFRAA 13 (L) 05/12/2019 0427   GFRAA 56 (L) 01/11/2013 1526   CBC    Component Value Date/Time   WBC 3.1 (L) 05/12/2019 0427   RBC 2.33 (L) 05/12/2019 0427   HGB 7.1 (L) 05/12/2019 0427   HGB 13.1 01/24/2019 1229   HCT 22.6 (L) 05/12/2019 0427   HCT 39.6 01/24/2019 1229   PLT 72 (L) 05/12/2019 0427   PLT 103 (L) 01/24/2019 1229   MCV 97.0 05/12/2019 0427   MCV 89 01/24/2019 1229   MCH 30.5 05/12/2019 0427   MCHC 31.4 05/12/2019 0427   RDW 15.1 05/12/2019 0427   RDW 14.9 01/24/2019 1229   LYMPHSABS 0.8 05/12/2019 0427   LYMPHSABS 1.2 01/24/2019 1229   MONOABS 0.2 05/12/2019 0427   EOSABS 0.1 05/12/2019 0427   EOSABS 0.2 01/24/2019 1229   BASOSABS 0.0 05/12/2019 0427   BASOSABS 0.1 01/24/2019 1229     Assessment/Plan: 1.  AKI/CKD stage 4- presumably due to ischemic atn in setting of hypotension and urosepsis.  Now, UOP is increasing and serum creatinine is slowly downtrending, suggesting recovery of GFR.  No indication for RRT.   2. Sepsis- due to enterococcus UTI.  RCID  following. 3. Covid+- per RCID, off isolation 4. Pancytopenia- relatively stable since his last hospitalization with improvement of platelets.  5. Acute on chronic hypoxic respiratory failure- as above.  Per primary svc 6. CKD stage 4 due to DM, HTN, atrophic left kidney.  Baseline Scr has been  3.2-4.4.   7. DM- per primary svc 8. OSA/OHS- per primary    [facility]

## 2019-05-13 LAB — HEMOGLOBIN AND HEMATOCRIT, BLOOD
HCT: 23.4 % — ABNORMAL LOW (ref 39.0–52.0)
Hemoglobin: 7.7 g/dL — ABNORMAL LOW (ref 13.0–17.0)

## 2019-05-13 LAB — CBC WITH DIFFERENTIAL/PLATELET
Abs Immature Granulocytes: 0.03 10*3/uL (ref 0.00–0.07)
Basophils Absolute: 0 10*3/uL (ref 0.0–0.1)
Basophils Relative: 0 %
Eosinophils Absolute: 0.1 10*3/uL (ref 0.0–0.5)
Eosinophils Relative: 4 %
HCT: 22.1 % — ABNORMAL LOW (ref 39.0–52.0)
Hemoglobin: 7.1 g/dL — ABNORMAL LOW (ref 13.0–17.0)
Immature Granulocytes: 1 %
Lymphocytes Relative: 24 %
Lymphs Abs: 0.6 10*3/uL — ABNORMAL LOW (ref 0.7–4.0)
MCH: 30.7 pg (ref 26.0–34.0)
MCHC: 32.1 g/dL (ref 30.0–36.0)
MCV: 95.7 fL (ref 80.0–100.0)
Monocytes Absolute: 0.2 10*3/uL (ref 0.1–1.0)
Monocytes Relative: 8 %
Neutro Abs: 1.6 10*3/uL — ABNORMAL LOW (ref 1.7–7.7)
Neutrophils Relative %: 63 %
Platelets: 73 10*3/uL — ABNORMAL LOW (ref 150–400)
RBC: 2.31 MIL/uL — ABNORMAL LOW (ref 4.22–5.81)
RDW: 14.9 % (ref 11.5–15.5)
WBC: 2.6 10*3/uL — ABNORMAL LOW (ref 4.0–10.5)
nRBC: 0.8 % — ABNORMAL HIGH (ref 0.0–0.2)

## 2019-05-13 LAB — PROTIME-INR
INR: 1.6 — ABNORMAL HIGH (ref 0.8–1.2)
Prothrombin Time: 18.8 seconds — ABNORMAL HIGH (ref 11.4–15.2)

## 2019-05-13 LAB — ABO/RH: ABO/RH(D): A POS

## 2019-05-13 LAB — GLUCOSE, CAPILLARY
Glucose-Capillary: 251 mg/dL — ABNORMAL HIGH (ref 70–99)
Glucose-Capillary: 220 mg/dL — ABNORMAL HIGH (ref 70–99)
Glucose-Capillary: 227 mg/dL — ABNORMAL HIGH (ref 70–99)
Glucose-Capillary: 267 mg/dL — ABNORMAL HIGH (ref 70–99)

## 2019-05-13 LAB — BASIC METABOLIC PANEL
Anion gap: 10 (ref 5–15)
BUN: 68 mg/dL — ABNORMAL HIGH (ref 6–20)
CO2: 27 mmol/L (ref 22–32)
Calcium: 8.1 mg/dL — ABNORMAL LOW (ref 8.9–10.3)
Chloride: 99 mmol/L (ref 98–111)
Creatinine, Ser: 4.74 mg/dL — ABNORMAL HIGH (ref 0.61–1.24)
GFR calc Af Amer: 15 mL/min — ABNORMAL LOW (ref >60)
GFR calc non Af Amer: 13 mL/min — ABNORMAL LOW (ref >60)
Glucose, Bld: 280 mg/dL — ABNORMAL HIGH (ref 70–99)
Potassium: 3.5 mmol/L (ref 3.5–5.1)
Sodium: 136 mmol/L (ref 135–145)

## 2019-05-13 LAB — PREPARE RBC (CROSSMATCH)

## 2019-05-13 MED ORDER — INSULIN GLARGINE 100 UNIT/ML ~~LOC~~ SOLN
23.0000 [IU] | Freq: Two times a day (BID) | SUBCUTANEOUS | Status: DC
  Administered 2019-05-13 – 2019-05-15 (×5): 23 [IU] via SUBCUTANEOUS
  Filled 2019-05-13 (×7): qty 0.23

## 2019-05-13 MED ORDER — WARFARIN SODIUM 3 MG PO TABS
3.0000 mg | ORAL_TABLET | Freq: Once | ORAL | Status: AC
  Administered 2019-05-13: 3 mg via ORAL
  Filled 2019-05-13: qty 1

## 2019-05-13 MED ORDER — SODIUM CHLORIDE 0.9% IV SOLUTION
Freq: Once | INTRAVENOUS | Status: AC
  Administered 2019-05-13: 11:00:00 via INTRAVENOUS

## 2019-05-13 MED ORDER — FENTANYL 50 MCG/HR TD PT72
1.0000 | MEDICATED_PATCH | TRANSDERMAL | Status: DC
  Administered 2019-05-13: 1 via TRANSDERMAL
  Filled 2019-05-13: qty 1

## 2019-05-13 MED ORDER — FENTANYL 50 MCG/HR TD PT72: 1.0000 | MEDICATED_PATCH | TRANSDERMAL | Status: DC

## 2019-05-13 NOTE — Progress Notes (Signed)
Patient ID: [name], male   DOB: 16-Aug-1965, 53 y.o.   MRN: [id number] S: no c/o. Sev BMs overnight. Renal labs improving, 2.3L UOP O:BP 113/68   Pulse 77   Temp 98.1 F (36.7 C) (Oral)   Resp 16   Ht 6\' 3"  (1.905 m)   Wt (!) 196.2 kg   SpO2 95%   BMI 54.06 kg/m   Intake/Output Summary (Last 24 hours) at 05/13/2019 0944 Last data filed at 05/13/2019 0600 Gross per 24 hour  Intake 750 ml  Output 2350 ml  Net -1600 ml   Intake/Output: I/O last 3 completed shifts: In: [id number] [P.O.:1330; IV Piggyback:159] Out: [id number] [Urine:3810]  Intake/Output this shift:  No intake/output data recorded. Weight change:  Gen: morbidly obese WM in NAD CVS: no rub Resp: decreased BS Abd: obese, +BS, soft Ext:  Trace presacral edema  Recent Labs  Lab 05/09/19 0959 05/10/19 0604 05/11/19 1237 05/12/19 0427 05/13/19 0214  NA 140 140 135 135 136  K 3.7 4.4 3.8 3.8 3.5  CL 99 101 97* 96* 99  CO2 32 29 26 26 27   GLUCOSE 144* 181* 219* 267* 280*  BUN 57* 61* 67* 69* 68*  CREATININE 4.93* 5.17* 5.49* 5.29* 4.74*  ALBUMIN 2.4* 2.3*  --   --   --   CALCIUM 8.3* 8.3* 8.2* 7.9* 8.1*  PHOS  --  5.0*  --   --   --   AST 27  --   --   --   --   ALT 14  --   --   --   --    Liver Function Tests: Recent Labs  Lab 05/09/19 0959 05/10/19 0604  AST 27  --   ALT 14  --   ALKPHOS 65  --   BILITOT 0.8  --   PROT 6.8  --   ALBUMIN 2.4* 2.3*   No results for input(s): LIPASE, AMYLASE in the last 168 hours. No results for input(s): AMMONIA in the last 168 hours. CBC: Recent Labs  Lab 05/09/19 0959 05/10/19 0604 05/12/19 0427 05/13/19 0214  WBC 3.1* 3.4* 3.1* 2.6*  NEUTROABS 1.5*  --  2.0 1.6*  HGB 8.2* 8.1* 7.1* 7.1*  HCT 27.9* 27.1* 22.6* 22.1*  MCV 101.1* 100.4* 97.0 95.7  PLT 77* 79* 72* 73*   Cardiac Enzymes: No results for input(s): CKTOTAL, CKMB, CKMBINDEX, TROPONINI in the last 168 hours. CBG: Recent Labs  Lab 05/12/19 0742 05/12/19 1144 05/12/19 1553 05/12/19 2156  05/13/19 0749  GLUCAP 238* 256* 274* 275* 251*    Iron Studies:  No results for input(s): IRON, TIBC, TRANSFERRIN, FERRITIN in the last 72 hours. Studies/Results: US Renal  Result Date: 05/11/2019 CLINICAL DATA:  Acute renal injury. EXAM: RENAL / URINARY TRACT ULTRASOUND COMPLETE COMPARISON:  Ultrasound 02/21/2019.  CT 12/28/2018. FINDINGS: Right Kidney: Renal measurements: 14.1 x 6.8 x 8.8 cm = volume: 441.5 mL . Echogenicity within normal limits. 3.1 cm complex right renal cyst. Although this appears smaller than noted on prior study, on today's exam a slightly thickened septation is noted within this cyst. No hydronephrosis visualized. Left Kidney: Not visualized. Atrophic left kidney noted on prior CT of 12/28/2018. Bladder: Appears normal for degree of bladder distention. Other: Exam limited due to patient's body habitus. IMPRESSION: 1. 3.1 cm complex right renal cyst is noted. Although this appears smaller than on prior study, on today's exam with slightly thickened septation is noted within the cyst. Further evaluation with renal MRI should be  considered. 2.  Left renal atrophy. Electronically Signed   By: Marcello Moores  Register   On: 05/11/2019 12:29   Dg Chest Port 1 View  Result Date: 05/11/2019 CLINICAL DATA:  53 year old male with decreased urination and hematuria. History of COVID-19. EXAM: PORTABLE CHEST 1 VIEW COMPARISON:  Chest radiograph dated 04/19/2019. FINDINGS: Bilateral peripheral and subpleural airspace densities similar or minimally improved. No new consolidative changes. There is no pleural effusion or pneumothorax. Stable cardiomegaly. No acute osseous pathology. IMPRESSION: Bilateral peripheral and subpleural airspace densities similar or minimally improved compared to the prior radiograph. Electronically Signed   By: Anner Crete M.D.   On: 05/11/2019 14:58   . sodium chloride   Intravenous Once  . amoxicillin  500 mg Oral Q12H  . busPIRone  15 mg Oral BID  .  Chlorhexidine Gluconate Cloth  6 each Topical Daily  . cholecalciferol  1,000 Units Oral Daily  . clomiPHENE  25 mg Oral QODAY  . docusate sodium  100 mg Oral BID  . DULoxetine  60 mg Oral Daily  . fentaNYL  1 patch Transdermal Q72H  . insulin aspart  0-20 Units Subcutaneous TID WC  . insulin aspart  6 Units Subcutaneous TID WC  . insulin glargine  23 Units Subcutaneous BID  . lamoTRIgine  200 mg Oral BID  . levETIRAcetam  1,000 mg Oral BID  . pregabalin  50 mg Oral Q M,W,F  . simvastatin  20 mg Oral QHS  . tamsulosin  0.4 mg Oral Daily  . vitamin C  500 mg Oral Q6H  . warfarin  3 mg Oral ONCE-1800  . Warfarin - Pharmacist Dosing Inpatient   Does not apply q1800  . zinc sulfate  220 mg Oral BID    BMET    Component Value Date/Time   NA 136 05/13/2019 0214   NA 141 01/24/2019 1229   K 3.5 05/13/2019 0214   CL 99 05/13/2019 0214   CO2 27 05/13/2019 0214   GLUCOSE 280 (H) 05/13/2019 0214   BUN 68 (H) 05/13/2019 0214   BUN 35 (H) 01/24/2019 1229   CREATININE 4.74 (H) 05/13/2019 0214   CREATININE 1.66 (H) 01/11/2013 1526   CALCIUM 8.1 (L) 05/13/2019 0214   GFRNONAA 13 (L) 05/13/2019 0214   GFRNONAA 49 (L) 01/11/2013 1526   GFRAA 15 (L) 05/13/2019 0214   GFRAA 56 (L) 01/11/2013 1526   CBC    Component Value Date/Time   WBC 2.6 (L) 05/13/2019 0214   RBC 2.31 (L) 05/13/2019 0214   HGB 7.1 (L) 05/13/2019 0214   HGB 13.1 01/24/2019 1229   HCT 22.1 (L) 05/13/2019 0214   HCT 39.6 01/24/2019 1229   PLT 73 (L) 05/13/2019 0214   PLT 103 (L) 01/24/2019 1229   MCV 95.7 05/13/2019 0214   MCV 89 01/24/2019 1229   MCH 30.7 05/13/2019 0214   MCHC 32.1 05/13/2019 0214   RDW 14.9 05/13/2019 0214   RDW 14.9 01/24/2019 1229   LYMPHSABS 0.6 (L) 05/13/2019 0214   LYMPHSABS 1.2 01/24/2019 1229   MONOABS 0.2 05/13/2019 0214   EOSABS 0.1 05/13/2019 0214   EOSABS 0.2 01/24/2019 1229   BASOSABS 0.0 05/13/2019 0214   BASOSABS 0.1 01/24/2019 1229     Assessment/Plan: 1.  AKI/CKD  stage 4- presumably due to ischemic atn in setting of hypotension and urosepsis.  Now, UOP is increasing and serum creatinine is downtrending.  2. Sepsis- due to enterococcus UTI.  RCID following. 3. Covid+- per RCID, off isolation 4. Pancytopenia-  relatively stable since his last hospitalization with improvement of platelets.  5. Acute on chronic hypoxic respiratory failure- as above.  Per primary svc 6. CKD stage 4 due to DM, HTN, atrophic left kidney.  Baseline Scr has been 3.2-4.4.   7. DM- per primary svc 8. OSA/OHS- per primary    No futher suggestions.  Will sign off for now.  Please call with any questions or concerns.  Pt will need follow up with nephrology and I will make arrangements.   Rexene Agent  [employer]

## 2019-05-13 NOTE — Evaluation (Signed)
Physical Therapy Evaluation Patient Details Name: [name] MRN: [id number] DOB: Mar 29, 1966 Today's Date: 05/13/2019   History of Present Illness  [name] is a 53 y.o. male with medical history of CKD stage IV, bipolar disorder, chronic respiratory failure on 3 L, diabetes mellitus type 2, DVT, OSA/OHS, pulmonary embolus, seizure disorder, thrombocytopenia, hyperlipidemia, chronic pain syndrome presenting with decreased urine output and urinary frequency x 1 week.  Course complicated due to recent hospitalization for acute on chronic respiratory failure secondary to COVID-19 PNA.  Clinical Impression  Pt admitted with/for urinary problems as stated above including respiratory failure due to COVID 19 PNA.  Pt currently limited functionally due to the problems listed. ( See problems list.)   Pt will benefit from PT to maximize function and safety in order to get ready for next venue listed below.     Follow Up Recommendations CIR(family will likely push for HHPT, but pt could use transfer and general mobility work simulating his set up at home.)    Equipment Recommendations  None recommended by PT    Recommendations for Other Services Rehab consult     Precautions / Restrictions Precautions Precautions: Fall      Mobility  Bed Mobility Overal bed mobility: Needs Assistance Bed Mobility: Supine to Sit;Rolling;Sit to Supine Rolling: Mod assist   Supine to sit: Mod assist Sit to supine: Max assist;Mod assist   General bed mobility comments: pt bridges well for positioning, but transition to/from supine takes significant truncal or LE support.  Transfers                 General transfer comment: declined trying any transfer with/without 2 person assist today  Ambulation/Gait             General Gait Details: pt is non ambulatory  Stairs            Wheelchair Mobility    Modified Rankin (Stroke Patients Only)       Balance Overall  balance assessment: Needs assistance Sitting-balance support: No upper extremity supported;Feet supported Sitting balance-Leahy Scale: Good Sitting balance - Comments: could move just outside BOS and sit safely on edge of overlay bed.                                     Pertinent Vitals/Pain Pain Assessment: Faces Faces Pain Scale: No hurt Pain Intervention(s): Monitored during session    Home Living Family/patient expects to be discharged to:: Private residence Living Arrangements: Spouse/significant other Available Help at Discharge: Family;Available 24 hours/day Type of Home: House Home Access: Ramped entrance     Home Layout: One level Home Equipment: Walker - 2 wheels;Adaptive equipment;Wheelchair - power      Prior Function Level of Independence: Needs assistance   Gait / Transfers Assistance Needed: transfers only, wife stating it is harder to accomplish (up hill) from power chair to bed.  ADL's / Homemaking Assistance Needed: spouse assist with bath however pt states he can do it on his own        Hand Dominance   Dominant Hand: Right    Extremity/Trunk Assessment   Upper Extremity Assessment Upper Extremity Assessment: Generalized weakness(L weaker than right, but pt uses both significantly for mobi)    Lower Extremity Assessment Lower Extremity Assessment: Generalized weakness       Communication   Communication: No difficulties  Cognition Arousal/Alertness: Awake/alert Behavior During Therapy: [facility]  for tasks assessed/performed Overall Cognitive Status: Within Functional Limits for tasks assessed                                        General Comments General comments (skin integrity, edema, etc.): Wife arrived at end of session to hear my recommendations.  VSS overall    Exercises Other Exercises Other Exercises: bil LE ROM exercise for warm up prior to mobility.   Assessment/Plan    PT Assessment Patient needs  continued PT services  PT Problem List Decreased strength;Decreased activity tolerance;Decreased mobility;Cardiopulmonary status limiting activity;Obesity       PT Treatment Interventions Functional mobility training;Therapeutic activities;Therapeutic exercise;Balance training;Patient/family education    PT Goals (Current goals can be found in the Care Plan section)  Acute Rehab PT Goals Patient Stated Goal: I want to get home PT Goal Formulation: With patient Time For Goal Achievement: 05/27/19 Potential to Achieve Goals: Fair    Frequency Min 3X/week   Barriers to discharge        Co-evaluation               AM-PAC PT "6 Clicks" Mobility  Outcome Measure Help needed turning from your back to your side while in a flat bed without using bedrails?: A Lot Help needed moving from lying on your back to sitting on the side of a flat bed without using bedrails?: A Lot Help needed moving to and from a bed to a chair (including a wheelchair)?: Total Help needed standing up from a chair using your arms (e.g., wheelchair or bedside chair)?: Total Help needed to walk in hospital room?: Total Help needed climbing 3-5 steps with a railing? : Total 6 Click Score: 8    End of Session Equipment Utilized During Treatment: Oxygen Activity Tolerance: Patient tolerated treatment well;Patient limited by fatigue Patient left: in bed;with call bell/phone within reach Nurse Communication: Mobility status;Need for lift equipment PT Visit Diagnosis: Other abnormalities of gait and mobility (R26.89);Muscle weakness (generalized) (M62.81)    Time: 1515-1600 PT Time Calculation (min) (ACUTE ONLY): 45 min   Charges:   PT Evaluation $PT Eval Moderate Complexity: 1 Mod PT Treatments $Therapeutic Activity: 23-37 mins        05/13/2019  Donnella Sham, PT Acute Rehabilitation Services [phone]  (pager) [phone]  (office)  Tessie Fass Shaquasha Gerstel 05/13/2019, 5:22 PM

## 2019-05-13 NOTE — Progress Notes (Signed)
[facility]  PROGRESS NOTE  [name] [id number] DOB: 26-Apr-1966 DOA: 05/09/2019 PCP: Sharion Balloon, FNP Admit date - 05/09/2019   Admitting Physician Orson Eva, MD  Outpatient Primary MD for the patient is Sharion Balloon, FNP  LOS - 4 Brief Narrative   [name] is a 53 y.o. year old male with medical history significant for chronic disease stage IV, bipolar, chronic respiratory failure on 3 L of submental oxygen per nasal cannula, DVT/PE on coumadin,type 2 diabetes mellitus, OSA/OHS, pulmonary embolism, seizures, thrombocytopenia, dyslipidemia and chronic pain syndrome.  Recent hospitalization 10/11 through 10/27 for SARS COVID-19 pneumonia who presented on 05/09/2019 as transfer.  Initially admitted at [facility] for decreasing urinary output, blood in urine and urinary frequency and admitted with working diagnosis of Aki on CKD complicated by UTI. Due to high concern for need for CVVHD and worsening respiratory status and hemodynamics and was found to have working diagnosis of aki on CKD complicated by UTI and recent COVID-19 infection.  Aztreonam changed to daptomycin due to E.faeclis UTI and listed PCN anaphylactic allergy. Tolerated PCN challenge and now on oral amoxicillin.     Subjective  [name] today feels breathing is its best in years. No chest pain. Hopeful to go home soon  A & P    1. Nonoliguric AKI on CKD stage IV, improving. Peak of 5.49, now 4.74. Likely ischemic ATN related to hypotension from sepsis caused by UTI. Good output. Not likely going need HD. Avoid nephrotoxins. RUS shows atrophic left kidney. Nephrology will make outpatient arrangements  2. Acute on chronic hypoxic respiratory failure, stable. Back on home o2 regimen of 3-4 L and maintaining SpO2 of 92%. CXR not consistent with pulmonary edema likely infiltrates from prior COVID. Not a new COVID infection.   3. Hypotension, stable. BP range 90s-120s off diuretics, MAPs >  65, closely continue to monitor off diuretics.  4. Sepsis, secondary to E. Faecalis UTI,resolved. Sepsis physiology resolved. Tolerated oral challenge, does no have anaphylactic PCN allergy. D'dc dapto now on amoxicillin, ID following and agrees.   5. COVID + test. Initially positive 04/09/19. Negative on 05/03/19. Again positive on 05/09/19. 11/4 test not accurate, discussed with ID will take months from initial test to not be positive. Not a new infection. Isolation precautions no longer necessary  6. Acute on Chronic Pancytopenia. Baseline hgb of 10, hgb hovering around 7.1 will give blood given decline and kidney disease, monitor posttransfusion CBC  7. PE, complicated by supratherapetuic INR (peak of 10, S/p vitamin K, INR 6). Now subtherapeutic, pharmacy managing coumadin, avoiding heparin given high risk for bleed.   8. T2DM. BG in high 200s will increase lantus 23 U bid ( lower than home dose), hold home jardiance  9. OSA/OHS. Back tohome regimen of 3-4L, not on CPAP at home  10. Depression and Bipolar disorder, stable. Continue home buspar, , clomiphene, cymbalata  11. Chronic pain syndrome, stable. Continue home flexeril PRN, lyrica, PRN norco. Held home fentanyl patch  12. Seizure disorder, stable. Continue home keppra and lamictal  13. Hyperlipidemia, stable. Continue home simvastatin  14. BPH, stable. Continue home flomax  15. Right renal cyst. Complex on u/s. Recommend renal MRI     Family Communication  :  None at bedside, sopoke with [name] at [phone] on 05/11/19, will update today  Code Status :  FULL  Disposition Plan  :  Monitoring kidney function , respiratory/oxygen status, PT/OT ordered given deconditioning expect discharge in 24-48  hours if clinically stable  Consults  :  Nephrology, ID,   Procedures  :  none  DVT Prophylaxis  :  Heparin  Lab Results  Component Value Date   PLT 73 (L) 05/13/2019    Diet :  Diet Order            Diet  renal with fluid restriction Fluid restriction: 1200 mL Fluid; Room service appropriate? Yes; Fluid consistency: Thin  Diet effective now               Inpatient Medications Scheduled Meds: . sodium chloride   Intravenous Once  . amoxicillin  500 mg Oral Q12H  . busPIRone  15 mg Oral BID  . Chlorhexidine Gluconate Cloth  6 each Topical Daily  . cholecalciferol  1,000 Units Oral Daily  . clomiPHENE  25 mg Oral QODAY  . docusate sodium  100 mg Oral BID  . DULoxetine  60 mg Oral Daily  . fentaNYL  1 patch Transdermal Q72H  . insulin aspart  0-20 Units Subcutaneous TID WC  . insulin aspart  6 Units Subcutaneous TID WC  . insulin glargine  23 Units Subcutaneous BID  . lamoTRIgine  200 mg Oral BID  . levETIRAcetam  1,000 mg Oral BID  . pregabalin  50 mg Oral Q M,W,F  . simvastatin  20 mg Oral QHS  . tamsulosin  0.4 mg Oral Daily  . vitamin C  500 mg Oral Q6H  . Warfarin - Pharmacist Dosing Inpatient   Does not apply q1800  . zinc sulfate  220 mg Oral BID   Continuous Infusions:  PRN Meds:.acetaminophen **OR** acetaminophen, cyclobenzaprine, diphenhydrAMINE, EPINEPHrine, HYDROcodone-acetaminophen, methylPREDNISolone (SOLU-MEDROL) injection, ondansetron **OR** ondansetron (ZOFRAN) IV, polyethylene glycol, sodium chloride  Antibiotics  :   Anti-infectives (From admission, onward)   Start     Dose/Rate Route Frequency Ordered Stop   05/12/19 1030  amoxicillin (AMOXIL) capsule 500 mg     500 mg Oral Every 12 hours 05/12/19 1029 05/16/19 2159   05/12/19 1000  amoxicillin (AMOXIL) capsule 500 mg  Status:  Discontinued     500 mg Oral Every 12 hours 05/12/19 0846 05/12/19 1029   05/11/19 1400  amoxicillin (AMOXIL) capsule 500 mg     500 mg Oral  Once 05/11/19 1223 05/11/19 1531   05/11/19 1300  DAPTOmycin (CUBICIN) 700 mg in sodium chloride 0.9 % IVPB  Status:  Discontinued     700 mg 228 mL/hr over 30 Minutes Intravenous Daily 05/11/19 1207 05/11/19 1218   05/09/19 2000  aztreonam  (AZACTAM) 1 g in sodium chloride 0.9 % 100 mL IVPB  Status:  Discontinued     1 g 200 mL/hr over 30 Minutes Intravenous Every 8 hours 05/09/19 1348 05/11/19 1105   05/09/19 1145  aztreonam (AZACTAM) 1 g in sodium chloride 0.9 % 100 mL IVPB     1 g 200 mL/hr over 30 Minutes Intravenous  Once 05/09/19 1144 05/09/19 1320       Objective   Vitals:   05/13/19 0500 05/13/19 0530 05/13/19 0600 05/13/19 0630  BP: 112/61 (!) 105/47 (!) 118/59 (!) 99/51  Pulse: 74 75 76 78  Resp: 17 19 18 18   Temp:      TempSrc:      SpO2: 93% 91% 92% 91%  Weight:      Height:        SpO2: 91 % O2 Flow Rate (L/min): 4 L/min  Wt Readings from Last 3 Encounters:  05/11/19 (!) 196.2  kg  04/23/19 (!) 189.1 kg  02/08/19 (!) 205.2 kg     Intake/Output Summary (Last 24 hours) at 05/13/2019 0730 Last data filed at 05/13/2019 0600 Gross per 24 hour  Intake 1009.03 ml  Output 2350 ml  Net -1340.97 ml    Physical Exam:  Awake Alert, Oriented X 3, Normal affect, obese male Dry blood in nares No new F.N deficits,  Difficult to auscultate due to body habitus, on 4 L [place] in no respiratory distress RRR,No Gallops,Rubs or new Murmurs,  +ve B.Sounds, Abd Soft, No tenderness, No organomegaly appreciated, No rebound, guarding or rigidity. No Cyanosis, Clubbing or edema, No new Rash or bruise     I have personally reviewed the following:   Data Reviewed:  CBC Recent Labs  Lab 05/09/19 0959 05/10/19 0604 05/12/19 0427 05/13/19 0214  WBC 3.1* 3.4* 3.1* 2.6*  HGB 8.2* 8.1* 7.1* 7.1*  HCT 27.9* 27.1* 22.6* 22.1*  PLT 77* 79* 72* 73*  MCV 101.1* 100.4* 97.0 95.7  MCH 29.7 30.0 30.5 30.7  MCHC 29.4* 29.9* 31.4 32.1  RDW 15.0 14.9 15.1 14.9  LYMPHSABS 1.1  --  0.8 0.6*  MONOABS 0.3  --  0.2 0.2  EOSABS 0.1  --  0.1 0.1  BASOSABS 0.0  --  0.0 0.0    Chemistries  Recent Labs  Lab 05/09/19 0959 05/10/19 0604 05/11/19 1237 05/12/19 0427 05/13/19 0214  NA 140 140 135 135 136  K 3.7 4.4 3.8  3.8 3.5  CL 99 101 97* 96* 99  CO2 32 29 26 26 27   GLUCOSE 144* 181* 219* 267* 280*  BUN 57* 61* 67* 69* 68*  CREATININE 4.93* 5.17* 5.49* 5.29* 4.74*  CALCIUM 8.3* 8.3* 8.2* 7.9* 8.1*  AST 27  --   --   --   --   ALT 14  --   --   --   --   ALKPHOS 65  --   --   --   --   BILITOT 0.8  --   --   --   --    ------------------------------------------------------------------------------------------------------------------ No results for input(s): CHOL, HDL, LDLCALC, TRIG, CHOLHDL, LDLDIRECT in the last 72 hours.  Lab Results  Component Value Date   HGBA1C 7.9 (H) 04/10/2019   ------------------------------------------------------------------------------------------------------------------ No results for input(s): TSH, T4TOTAL, T3FREE, THYROIDAB in the last 72 hours.  Invalid input(s): FREET3 ------------------------------------------------------------------------------------------------------------------ No results for input(s): VITAMINB12, FOLATE, FERRITIN, TIBC, IRON, RETICCTPCT in the last 72 hours.  Coagulation profile Recent Labs  Lab 05/09/19 1326 05/10/19 0604 05/11/19 0932 05/12/19 0427 05/13/19 0214  INR 10.0* 6.6* 1.8* 1.6* 1.6*    No results for input(s): DDIMER in the last 72 hours.  Cardiac Enzymes No results for input(s): CKMB, TROPONINI, MYOGLOBIN in the last 168 hours.  Invalid input(s): CK ------------------------------------------------------------------------------------------------------------------    Component Value Date/Time   BNP 137.9 (H) 04/24/2019 0145    Micro Results Recent Results (from the past 240 hour(s))  Urine culture     Status: Abnormal   Collection Time: 05/09/19  9:38 AM   Specimen: Urine, Clean Catch  Result Value Ref Range Status   Specimen Description   Final    URINE, CLEAN CATCH Performed at [facility], [address], [place], [place] [address]    Special Requests   Final    NONE Performed at [facility],  [address] [place], [place] [address]    Culture >=100,000 COLONIES/mL ENTEROCOCCUS FAECALIS (A)  Final   Report Status 05/11/2019  FINAL  Final   Organism ID, Bacteria ENTEROCOCCUS FAECALIS (A)  Final      Susceptibility   Enterococcus faecalis - MIC*    AMPICILLIN <=2 SENSITIVE Sensitive     LEVOFLOXACIN 1 SENSITIVE Sensitive     NITROFURANTOIN <=16 SENSITIVE Sensitive     VANCOMYCIN 1 SENSITIVE Sensitive     * >=100,000 COLONIES/mL ENTEROCOCCUS FAECALIS  SARS CORONAVIRUS 2 (TAT 6-24 HRS) Nasopharyngeal Nasopharyngeal Swab     Status: Abnormal   Collection Time: 05/09/19 10:39 AM   Specimen: Nasopharyngeal Swab  Result Value Ref Range Status   SARS Coronavirus 2 POSITIVE (A) NEGATIVE Final    Comment: (NOTE) SARS-CoV-2 target nucleic acids are DETECTED. The SARS-CoV-2 RNA is generally detectable in upper and lower respiratory specimens during the acute phase of infection. Positive results are indicative of active infection with SARS-CoV-2. Clinical  correlation with patient history and other diagnostic information is necessary to determine patient infection status. Positive results do  not rule out bacterial infection or co-infection with other viruses. The expected result is Negative. Fact Sheet for Patients: SugarRoll.be Fact Sheet for Healthcare Providers: https://www.woods-mathews.com/ This test is not yet approved or cleared by the [place] FDA and  has been authorized for detection and/or diagnosis of SARS-CoV-2 by FDA under an Emergency Use Authorization (EUA). This EUA will remain  in effect (meaning this test can be used) for the duration of the COVID-19 declaration under Section 564(b)(1) of the Act, 21 U.S.C.  section 360bbb-3(b)(1), unless the authorization is terminated or revoked sooner. Performed at [facility], [place] [address], [place], [place] [address]   MRSA PCR Screening     Status: None   Collection Time:  05/11/19 12:37 AM   Specimen: Nasal Mucosa; Nasopharyngeal  Result Value Ref Range Status   MRSA by PCR NEGATIVE NEGATIVE Final    Comment:        The GeneXpert MRSA Assay (FDA approved for NASAL specimens only), is one component of a comprehensive MRSA colonization surveillance program. It is not intended to diagnose MRSA infection nor to guide or monitor treatment for MRSA infections. Performed at [facility], [place] [address], [place], [place] [address]     Radiology Reports US Renal  Result Date: 05/11/2019 CLINICAL DATA:  Acute renal injury. EXAM: RENAL / URINARY TRACT ULTRASOUND COMPLETE COMPARISON:  Ultrasound 02/21/2019.  CT 12/28/2018. FINDINGS: Right Kidney: Renal measurements: 14.1 x 6.8 x 8.8 cm = volume: 441.5 mL . Echogenicity within normal limits. 3.1 cm complex right renal cyst. Although this appears smaller than noted on prior study, on today's exam a slightly thickened septation is noted within this cyst. No hydronephrosis visualized. Left Kidney: Not visualized. Atrophic left kidney noted on prior CT of 12/28/2018. Bladder: Appears normal for degree of bladder distention. Other: Exam limited due to patient's body habitus. IMPRESSION: 1. 3.1 cm complex right renal cyst is noted. Although this appears smaller than on prior study, on today's exam with slightly thickened septation is noted within the cyst. Further evaluation with renal MRI should be considered. 2.  Left renal atrophy. Electronically Signed   By: Marcello Moores  Register   On: 05/11/2019 12:29   Dg Chest Port 1 View  Result Date: 05/11/2019 CLINICAL DATA:  53 year old male with decreased urination and hematuria. History of COVID-19. EXAM: PORTABLE CHEST 1 VIEW COMPARISON:  Chest radiograph dated 04/19/2019. FINDINGS: Bilateral peripheral and subpleural airspace densities similar or minimally improved. No new consolidative changes. There is no pleural effusion or  pneumothorax. Stable cardiomegaly. No acute  osseous pathology. IMPRESSION: Bilateral peripheral and subpleural airspace densities similar or minimally improved compared to the prior radiograph. Electronically Signed   By: Anner Crete M.D.   On: 05/11/2019 14:58   Dg Chest Port 1 View  Result Date: 04/19/2019 CLINICAL DATA:  Hypoxia. COVID-19 pneumonia. EXAM: PORTABLE CHEST 1 VIEW COMPARISON:  One-view chest x-ray 04/09/2019 FINDINGS: Peripheral airspace opacities are again seen bilaterally. Greatest densities in the right upper lobe. Overall aeration is improved. IMPRESSION: 1. Shifting appearance of extensive bilateral airspace disease compatible with pneumonia. 2. Overall aeration is improved with some residual disease in the right upper lobe. Electronically Signed   By: San Morelle M.D.   On: 04/19/2019 12:37     Time Spent in minutes  30     Desiree Hane M.D on 05/13/2019 at 7:30 AM  To page go to [email or web address] - password [facility]

## 2019-05-13 NOTE — Progress Notes (Signed)
ANTICOAGULATION CONSULT NOTE -   Pharmacy Consult for warfarin Indication: history of DVT/PE  Allergies  Allergen Reactions  . Bee Venom Anaphylaxis, Shortness Of Breath and Swelling  . Other Shortness Of Breath    Itching, rash with IVP DYE, iodine, shellfish LATEX  . Shellfish Allergy Nausea And Vomiting and Other (See Comments)    Feels like insides are twisting  . Iodinated Diagnostic Agents     Other reaction(s): RASH  . Iohexol      Code: RASH, Desc: PT WAS ON PREDNISONE FOR GOUT TX. @ TIME OF SCAN AND RECEIVED 50 MG OF BENADRYL IV-ARS 10/08/07   . Iodine Rash  . Latex Rash    Patient Measurements: Height: 6\' 3"  (190.5 cm) Weight: (!) 432 lb 8.7 oz (196.2 kg) IBW/kg (Calculated) : 84.5   Vital Signs: Temp: 98.1 F (36.7 C) (11/14 0700) Temp Source: Oral (11/14 0700) BP: 113/68 (11/14 0700) Pulse Rate: 77 (11/14 0700)  Labs: Recent Labs    05/11/19 0932 05/11/19 1237 05/12/19 0427 05/13/19 0214  HGB  --   --  7.1* 7.1*  HCT  --   --  22.6* 22.1*  PLT  --   --  72* 73*  LABPROT 20.7*  --  19.0* 18.8*  INR 1.8*  --  1.6* 1.6*  CREATININE  --  5.49* 5.29* 4.74*    Estimated Creatinine Clearance: 33.3 mL/min (A) (by C-G formula based on SCr of 4.74 mg/dL (H)).   Medical History: Past Medical History:  Diagnosis Date  . Anemia   . Anxiety   . Arthritis   . Bipolar 1 disorder ([place])   . Bronchitis    hx of  . Bruises easily   . Chronic kidney disease    decreased left kidney fx  . Chronic pain syndrome 05/11/2012  . Chronic respiratory failure with hypoxia (HCC)    And with hypercapnia  . Diabetes mellitus without complication ([place])   . Diabetic neuropathy ([place])   . DVT (deep venous thrombosis) (HCC)    LLE DVT ~ '12  . Dyspnea    with ambulation  . GERD (gastroesophageal reflux disease)   . History of 2019 novel coronavirus disease (COVID-19)   . HOH (hard of hearing) 2015   has hearing aids  . Mental disorder   . Migraine   .  Neuromuscular disorder ([place])   . Obesity hypoventilation syndrome ([place])   . Obstructive sleep apnea    CPAP  . PE (pulmonary embolism)    bilateral PE ~ '11  . Seizures ([place])   . Thrombocytopenia ([place]) 05/11/2012    Assessment: Pharmacy consulted to dose warfarin in patient with history of DVT/PE.  Patient's INR on admission is >10. Patient given vitamin K 5mg  PO on 11/10 and Vitamin K 2.5 mg IV on 11/11. Pt re-started on warfarin 5mg  x 1 dose 11/12. INR remains below goal at 1.6.  Pt with low Hg and PLT at baseline. Hg stable 7.1, no bleeding noted.   Given his initial presentation with very high INR and need for reversal, his PTA dose of 5mg  was likely too high. Vitamin K reversal effects will persist over the next 24-48h, so slight decrease in INR despite administration of warfarin 5mg  is expected.   Goal of Therapy:  INR 2-3 Monitor platelets by anticoagulation protocol: Yes   Plan:  Warfarin 3 mg PO x 1 Daily INR, CBC Monitor for bleeding  Vertis Kelch, PharmD PGY2 Cardiology Pharmacy Resident Phone [phone] 05/13/2019  9:17 AM  Please check [email or web address] for unit-specific pharmacist phone numbers

## 2019-05-13 NOTE — Progress Notes (Signed)
Patient received to the floor via nursing staff on size wise bed. Alert/oriented patient on 4L oxygen [place]. No sign of distress noted. Patient oriented to the floor. Skin check completed with Bing Neighbors. Vital signs checked. CBG checked. Foley care done.

## 2019-05-14 DIAGNOSIS — R791 Abnormal coagulation profile: Secondary | ICD-10-CM

## 2019-05-14 LAB — BASIC METABOLIC PANEL
Anion gap: 11 (ref 5–15)
BUN: 67 mg/dL — ABNORMAL HIGH (ref 6–20)
CO2: 28 mmol/L (ref 22–32)
Calcium: 8.2 mg/dL — ABNORMAL LOW (ref 8.9–10.3)
Chloride: 98 mmol/L (ref 98–111)
Creatinine, Ser: 4.6 mg/dL — ABNORMAL HIGH (ref 0.61–1.24)
GFR calc Af Amer: 16 mL/min — ABNORMAL LOW (ref >60)
GFR calc non Af Amer: 14 mL/min — ABNORMAL LOW (ref >60)
Glucose, Bld: 281 mg/dL — ABNORMAL HIGH (ref 70–99)
Potassium: 3.4 mmol/L — ABNORMAL LOW (ref 3.5–5.1)
Sodium: 137 mmol/L (ref 135–145)

## 2019-05-14 LAB — TYPE AND SCREEN
ABO/RH(D): A POS
Antibody Screen: NEGATIVE
Unit division: 0

## 2019-05-14 LAB — CBC WITH DIFFERENTIAL/PLATELET
Abs Immature Granulocytes: 0.02 10*3/uL (ref 0.00–0.07)
Basophils Absolute: 0 10*3/uL (ref 0.0–0.1)
Basophils Relative: 0 %
Eosinophils Absolute: 0.1 10*3/uL (ref 0.0–0.5)
Eosinophils Relative: 4 %
HCT: 24.8 % — ABNORMAL LOW (ref 39.0–52.0)
Hemoglobin: 7.7 g/dL — ABNORMAL LOW (ref 13.0–17.0)
Immature Granulocytes: 1 %
Lymphocytes Relative: 28 %
Lymphs Abs: 0.7 10*3/uL (ref 0.7–4.0)
MCH: 30.3 pg (ref 26.0–34.0)
MCHC: 31 g/dL (ref 30.0–36.0)
MCV: 97.6 fL (ref 80.0–100.0)
Monocytes Absolute: 0.2 10*3/uL (ref 0.1–1.0)
Monocytes Relative: 9 %
Neutro Abs: 1.5 10*3/uL — ABNORMAL LOW (ref 1.7–7.7)
Neutrophils Relative %: 58 %
Platelets: 78 10*3/uL — ABNORMAL LOW (ref 150–400)
RBC: 2.54 MIL/uL — ABNORMAL LOW (ref 4.22–5.81)
RDW: 15.1 % (ref 11.5–15.5)
WBC: 2.6 10*3/uL — ABNORMAL LOW (ref 4.0–10.5)
nRBC: 0 % (ref 0.0–0.2)

## 2019-05-14 LAB — PROTIME-INR
INR: 1.6 — ABNORMAL HIGH (ref 0.8–1.2)
Prothrombin Time: 18.4 seconds — ABNORMAL HIGH (ref 11.4–15.2)

## 2019-05-14 LAB — BPAM RBC
Blood Product Expiration Date: 202012082359
ISSUE DATE / TIME: 202011141040
Unit Type and Rh: 6200

## 2019-05-14 LAB — GLUCOSE, CAPILLARY
Glucose-Capillary: 238 mg/dL — ABNORMAL HIGH (ref 70–99)
Glucose-Capillary: 211 mg/dL — ABNORMAL HIGH (ref 70–99)
Glucose-Capillary: 178 mg/dL — ABNORMAL HIGH (ref 70–99)
Glucose-Capillary: 258 mg/dL — ABNORMAL HIGH (ref 70–99)

## 2019-05-14 MED ORDER — POTASSIUM CHLORIDE CRYS ER 20 MEQ PO TBCR
20.0000 meq | EXTENDED_RELEASE_TABLET | Freq: Once | ORAL | Status: AC
  Administered 2019-05-14: 20 meq via ORAL
  Filled 2019-05-14: qty 1

## 2019-05-14 MED ORDER — WARFARIN SODIUM 3 MG PO TABS
3.0000 mg | ORAL_TABLET | Freq: Once | ORAL | Status: AC
  Administered 2019-05-14: 3 mg via ORAL
  Filled 2019-05-14: qty 1

## 2019-05-14 MED ORDER — INSULIN ASPART 100 UNIT/ML ~~LOC~~ SOLN
10.0000 [IU] | Freq: Three times a day (TID) | SUBCUTANEOUS | Status: DC
  Administered 2019-05-14 – 2019-05-15 (×3): 10 [IU] via SUBCUTANEOUS

## 2019-05-14 NOTE — Progress Notes (Signed)
Removed Foley as per MD order.  Gave pt a urinal and instructed patient to save urine and let RN know if he urinates.

## 2019-05-14 NOTE — Progress Notes (Signed)
ANTICOAGULATION CONSULT NOTE -   Pharmacy Consult for warfarin Indication: history of DVT/PE  Allergies  Allergen Reactions  . Bee Venom Anaphylaxis, Shortness Of Breath and Swelling  . Other Shortness Of Breath    Itching, rash with IVP DYE, iodine, shellfish LATEX  . Shellfish Allergy Nausea And Vomiting and Other (See Comments)    Feels like insides are twisting  . Iodinated Diagnostic Agents     Other reaction(s): RASH  . Iohexol      Code: RASH, Desc: PT WAS ON PREDNISONE FOR GOUT TX. @ TIME OF SCAN AND RECEIVED 50 MG OF BENADRYL IV-ARS 10/08/07   . Iodine Rash  . Latex Rash    Patient Measurements: Height: 6\' 3"  (190.5 cm) Weight: (!) 432 lb 8.7 oz (196.2 kg) IBW/kg (Calculated) : 84.5   Vital Signs: Temp: 98.4 F (36.9 C) (11/15 0609) Temp Source: Oral (11/15 0609) BP: 112/47 (11/15 0609) Pulse Rate: 71 (11/15 0609)  Labs: Recent Labs    05/12/19 0427 05/13/19 0214 05/13/19 1451 05/14/19 0226  HGB 7.1* 7.1* 7.7* 7.7*  HCT 22.6* 22.1* 23.4* 24.8*  PLT 72* 73*  --  78*  LABPROT 19.0* 18.8*  --  18.4*  INR 1.6* 1.6*  --  1.6*  CREATININE 5.29* 4.74*  --  4.60*    Estimated Creatinine Clearance: 34.3 mL/min (A) (by C-G formula based on SCr of 4.6 mg/dL (H)).   Medical History: Past Medical History:  Diagnosis Date  . Anemia   . Anxiety   . Arthritis   . Bipolar 1 disorder ([place])   . Bronchitis    hx of  . Bruises easily   . Chronic kidney disease    decreased left kidney fx  . Chronic pain syndrome 05/11/2012  . Chronic respiratory failure with hypoxia (HCC)    And with hypercapnia  . Diabetes mellitus without complication ([place])   . Diabetic neuropathy ([place])   . DVT (deep venous thrombosis) (HCC)    LLE DVT ~ '12  . Dyspnea    with ambulation  . GERD (gastroesophageal reflux disease)   . History of 2019 novel coronavirus disease (COVID-19)   . HOH (hard of hearing) 2015   has hearing aids  . Mental disorder   . Migraine   .  Neuromuscular disorder ([place])   . Obesity hypoventilation syndrome ([place])   . Obstructive sleep apnea    CPAP  . PE (pulmonary embolism)    bilateral PE ~ '11  . Seizures ([place])   . Thrombocytopenia ([place]) 05/11/2012    Assessment: Pharmacy consulted to dose warfarin in patient with history of DVT/PE.  Patient's INR on admission is >10. Patient given vitamin K 5mg  PO on 11/10 and Vitamin K 2.5 mg IV on 11/11. Pt re-started on warfarin 5mg  x 1 dose 11/12.   INR remains below goal at 1.6. Pt with low Hg and PLT at baseline. Hg stable 7.7, no overt bleeding noted.   Given his initial presentation with very high INR and need for reversal, his PTA dose of 5mg  was likely too high. Vitamin K reversal effects persist over 24-48h after administration, so effects should be wearing off.  Goal of Therapy:  INR 2-3 Monitor platelets by anticoagulation protocol: Yes   Plan:  Warfarin 3 mg PO x 1 Daily INR, CBC Monitor for bleeding  Richardine Service, PharmD PGY1 Pharmacy Resident Phone: [phone] 05/14/2019  8:22 AM  Please check [email or web address] for unit-specific pharmacy phone numbers.

## 2019-05-14 NOTE — Progress Notes (Signed)
PROGRESS NOTE    [name]  [id number] DOB: 1965-10-05 DOA: 05/09/2019 PCP: Patrick Balloon, FNP   Brief Narrative:  HPI on 05/09/2019 by [name] [name] is a 53 y.o. male with medical history of CKD stage IV, bipolar disorder, chronic respiratory failure on 3 L, diabetes mellitus type 2, DVT, OSA/OHS, pulmonary embolus, seizure disorder, thrombocytopenia, hyperlipidemia, chronic pain syndrome presenting with decreased urine output and urinary frequency x 1 week.  The patient feels that after he urinates, he continues to have a sensation of incomplete emptying.  He denies any NSAIDs or new medications.  The patient is compliant with his furosemide.  On 05/08/2019, the patient noted some bloody urine.  As result, the patient presented for further evaluation.  Notably, the patient had a recent hospitalization from 04/09/2019 through 04/25/2019 during which time the patient was treated for acute on chronic respiratory failure secondary to COVID-19 pneumonia.  Notably, the patient had a repeat negative Covid test on 05/03/2019.  He denies any fevers, chills, headache, neck pain, chest pain, shortness of breath, worsening abdominal pain, diarrhea, hematochezia, melena.  He states that he has chronic back pain.  Interim history Patient admitted to [facility] initially for decreased urinary output, hematuria, increased urinary frequency and was admitted for acute kidney injury on chronic kidney disease complicated by urinary tract infection.  Due to concern of possible need of CVVHD and worsening respiratory symptoms, patient was sent to [facility].  He was initially placed on aztreonam which was transitioned to daptomycin for E faecalis UTI as patient has a penicillin allergy.  He has tolerated a penicillin challenge with oral amoxicillin.  Assessment & Plan   Nonoliguirc Acute kidney injury on chronic kidney injury, stage IV -Creatinine peaked 5.49, now down to 4.6 (baseline  creatinine 3.2-4.4 per nephrology) -Nephrology consulted and appreciated, and has now signed off -Suspect kidney injury likely ischemic ATN related to hypotension from sepsis caused by UTI -of note, patient was placed on IV lasix -Currently has good urine output -Renal ultrasound shows atrophic left kidney -Patient to follow-up with nephrology as an outpatient -Will discontinue Foley catheter today  Acute on chronic hypoxic respiratory failure -Patient uses 3 to 4 L of oxygen at home at baseline -Initially, patient was found to have hypoxia 83 to 88% on 6 L of nasal cannula -Currently appears to be on his baseline oxygen -Chest x-ray on 05/11/2019 showed bilateral peripheral and subpleural airspace densities similar or minimally improved compared to previous x-ray compared on 04/19/2019 -Of note, patient did have recent Covid infection  Hypotension -resolved  Sepsis secondary to E faecalis UTI -Sepsis physiology is resolved- suspect this was on admission as patient appears to have had leukopenia with tachypnea. Patient later developed hypotension. -Blood cultures show no growth to date -Patient was on aztreonam and transition to daptomycin. -Oral challenge of penicillin was tolerated with oxacillin -Infectious disease consulted and appreciated  Covid positive -Patient was initially Covid positive on 04/09/2019.  Retesting on 05/03/2019 was negative.  However on 05/09/2019 patient was positive again.  Infectious disease felt that 05/03/2019 test was not accurate and it will likely take patient to several months to be negative. -Currently no new infection and no isolation precautions needed  Acute on chronic pancytopenia -Appears stable -patient received 1u PRBC on 05/13/2019 -hemoglobin currently stable 7.7 -platelets stable, 78  Pulmonary embolism -Complicated by supratherapeutic INR -INR peaked at 10, status post vitamin K -INR now subtherapeutic, currently 1.6 -Pharmacy  managing  Coumadin.  Currently not being bridged with heparin given his high risk of bleed  Diabetes mellitus, type II -Jardiance held -Continue Lantus, insulin sliding scale, CBG monitoring  OSA/OHS  -Continue supplemental oxygen -Patient currently not on CPAP at home   Depression/bipolar disorder -Currently stable, continue BuSpar, Cymbalta  Chronic pain syndrome -Continue Flexeril, Lyrica, Norco -Fentanyl patch held  Seizure disorder -Stable, continue Keppra and Lamictal  Hyperlipidemia -Continue statin  BPH -Stable, continue Flomax  Right renal cyst -Complex on ultrasound, recommended renal MRI which can be done as an outpatient  Hypokalemia -will replace and monitor BMP  Physical deconditioning -Suspect secondary to multifactorial causes including sepsis, recent and frequent hospitalization with Covid, acute kidney injury, morbid obesity. -PT recommending CIR for home health -OT consulted and appreciated -Patient rehab consulted and appreciated  Morbid obesity -BMI 54.06 -Patient will need to follow-up with his PCP to discuss lifestyle modifications and interventions.  DVT Prophylaxis  Coumadin  Code Status: Full  Family Communication: None at bedside. Left message for wife.  Disposition Plan: Admitted, pending OT and CIR consult. Dispo TBD  Consultants Nephrology Infectious disease PCCM Inpatient rehab  Procedures  Renal US  Antibiotics   Anti-infectives (From admission, onward)   Start     Dose/Rate Route Frequency Ordered Stop   05/12/19 1030  amoxicillin (AMOXIL) capsule 500 mg     500 mg Oral Every 12 hours 05/12/19 1029 05/16/19 2159   05/12/19 1000  amoxicillin (AMOXIL) capsule 500 mg  Status:  Discontinued     500 mg Oral Every 12 hours 05/12/19 0846 05/12/19 1029   05/11/19 1400  amoxicillin (AMOXIL) capsule 500 mg     500 mg Oral  Once 05/11/19 1223 05/11/19 1531   05/11/19 1300  DAPTOmycin (CUBICIN) 700 mg in sodium chloride 0.9 %  IVPB  Status:  Discontinued     700 mg 228 mL/hr over 30 Minutes Intravenous Daily 05/11/19 1207 05/11/19 1218   05/09/19 2000  aztreonam (AZACTAM) 1 g in sodium chloride 0.9 % 100 mL IVPB  Status:  Discontinued     1 g 200 mL/hr over 30 Minutes Intravenous Every 8 hours 05/09/19 1348 05/11/19 1105   05/09/19 1145  aztreonam (AZACTAM) 1 g in sodium chloride 0.9 % 100 mL IVPB     1 g 200 mL/hr over 30 Minutes Intravenous  Once 05/09/19 1144 05/09/19 1320      Subjective:   [name] seen and examined today.  Patient feels his breathing has improved and is ready to go home.  He states he misses being at home.  Denies current chest pain, shortness of breath, abdominal pain, nausea vomiting, diarrhea constipation, dizziness or headache. Objective:   Vitals:   05/13/19 1954 05/13/19 2000 05/13/19 2234 05/14/19 0609  BP:  (!) 100/51 (!) 107/51 (!) 112/47  Pulse:  76 75 71  Resp:  20    Temp: 98.1 F (36.7 C)  98.3 F (36.8 C) 98.4 F (36.9 C)  TempSrc: Oral  Oral Oral  SpO2:  (!) 87% 91% 91%  Weight:      Height:        Intake/Output Summary (Last 24 hours) at 05/14/2019 1240 Last data filed at 05/14/2019 1107 Gross per 24 hour  Intake 315 ml  Output 2950 ml  Net -2635 ml   Filed Weights   05/09/19 0928 05/09/19 1607 05/11/19 0600  Weight: (!) 192.8 kg (!) 196.3 kg (!) 196.2 kg    Exam  General: Well developed, chronically ill-appearing,  NAD  HEENT: NCAT, mucous membranes moist.   Neck: Supple, no JVD, no masses  Cardiovascular: Distant heart sounds, S1/S2 auscultated, no murmurs appreciated, RRR  Respiratory: Diminished however clear to auscultation anteriorly  Abdomen: Soft, pes nontender, nondistended, + bowel sounds  Extremities: warm dry without cyanosis clubbing or edema  Neuro: AAOx3, nonfocal  Psych: Appropriate   Data Reviewed: I have personally reviewed following labs and imaging studies  CBC: Recent Labs  Lab 05/09/19 0959 05/10/19  0604 05/12/19 0427 05/13/19 0214 05/13/19 1451 05/14/19 0226  WBC 3.1* 3.4* 3.1* 2.6*  --  2.6*  NEUTROABS 1.5*  --  2.0 1.6*  --  1.5*  HGB 8.2* 8.1* 7.1* 7.1* 7.7* 7.7*  HCT 27.9* 27.1* 22.6* 22.1* 23.4* 24.8*  MCV 101.1* 100.4* 97.0 95.7  --  97.6  PLT 77* 79* 72* 73*  --  78*   Basic Metabolic Panel: Recent Labs  Lab 05/10/19 0604 05/11/19 1237 05/12/19 0427 05/13/19 0214 05/14/19 0226  NA 140 135 135 136 137  K 4.4 3.8 3.8 3.5 3.4*  CL 101 97* 96* 99 98  CO2 29 26 26 27 28   GLUCOSE 181* 219* 267* 280* 281*  BUN 61* 67* 69* 68* 67*  CREATININE 5.17* 5.49* 5.29* 4.74* 4.60*  CALCIUM 8.3* 8.2* 7.9* 8.1* 8.2*  PHOS 5.0*  --   --   --   --    GFR: Estimated Creatinine Clearance: 34.3 mL/min (A) (by C-G formula based on SCr of 4.6 mg/dL (H)). Liver Function Tests: Recent Labs  Lab 05/09/19 0959 05/10/19 0604  AST 27  --   ALT 14  --   ALKPHOS 65  --   BILITOT 0.8  --   PROT 6.8  --   ALBUMIN 2.4* 2.3*   No results for input(s): LIPASE, AMYLASE in the last 168 hours. No results for input(s): AMMONIA in the last 168 hours. Coagulation Profile: Recent Labs  Lab 05/10/19 0604 05/11/19 0932 05/12/19 0427 05/13/19 0214 05/14/19 0226  INR 6.6* 1.8* 1.6* 1.6* 1.6*   Cardiac Enzymes: No results for input(s): CKTOTAL, CKMB, CKMBINDEX, TROPONINI in the last 168 hours. BNP (last 3 results) No results for input(s): PROBNP in the last 8760 hours. HbA1C: No results for input(s): HGBA1C in the last 72 hours. CBG: Recent Labs  Lab 05/13/19 1147 05/13/19 1603 05/13/19 2254 05/14/19 0757 05/14/19 1203  GLUCAP 220* 227* 267* 238* 211*   Lipid Profile: No results for input(s): CHOL, HDL, LDLCALC, TRIG, CHOLHDL, LDLDIRECT in the last 72 hours. Thyroid Function Tests: No results for input(s): TSH, T4TOTAL, FREET4, T3FREE, THYROIDAB in the last 72 hours. Anemia Panel: No results for input(s): VITAMINB12, FOLATE, FERRITIN, TIBC, IRON, RETICCTPCT in the last 72  hours. Urine analysis:    Component Value Date/Time   COLORURINE YELLOW 05/09/2019 0938   APPEARANCEUR CLOUDY (A) 05/09/2019 0938   APPEARANCEUR Clear 10/24/2018 1235   LABSPEC 1.011 05/09/2019 0938   PHURINE 5.0 05/09/2019 0938   GLUCOSEU 150 (A) 05/09/2019 0938   HGBUR LARGE (A) 05/09/2019 0938   BILIRUBINUR NEGATIVE 05/09/2019 0938   BILIRUBINUR Negative 10/24/2018 [place] 05/09/2019 0938   PROTEINUR 100 (A) 05/09/2019 0938   UROBILINOGEN negative 02/22/2015 1427   UROBILINOGEN 0.2 10/17/2010 0951   NITRITE NEGATIVE 05/09/2019 0938   LEUKOCYTESUR LARGE (A) 05/09/2019 0938   Sepsis Labs: @LABRCNTIP (procalcitonin:4,lacticidven:4)  ) Recent Results (from the past 240 hour(s))  Urine culture     Status: Abnormal   Collection Time: 05/09/19  9:38  AM   Specimen: Urine, Clean Catch  Result Value Ref Range Status   Specimen Description   Final    URINE, CLEAN CATCH Performed at [facility], [address] [place], [place] [address]    Special Requests   Final    NONE Performed at [facility], [address], [place], [place] [address]    Culture >=100,000 COLONIES/mL ENTEROCOCCUS FAECALIS (A)  Final   Report Status 05/11/2019 FINAL  Final   Organism ID, Bacteria ENTEROCOCCUS FAECALIS (A)  Final      Susceptibility   Enterococcus faecalis - MIC*    AMPICILLIN <=2 SENSITIVE Sensitive     LEVOFLOXACIN 1 SENSITIVE Sensitive     NITROFURANTOIN <=16 SENSITIVE Sensitive     VANCOMYCIN 1 SENSITIVE Sensitive     * >=100,000 COLONIES/mL ENTEROCOCCUS FAECALIS  SARS CORONAVIRUS 2 ([name] 6-24 HRS) Nasopharyngeal Nasopharyngeal Swab     Status: Abnormal   Collection Time: 05/09/19 10:39 AM   Specimen: Nasopharyngeal Swab  Result Value Ref Range Status   SARS Coronavirus 2 POSITIVE (A) NEGATIVE Final    Comment: (NOTE) SARS-CoV-2 target nucleic acids are DETECTED. The SARS-CoV-2 RNA is generally detectable in upper and lower respiratory specimens during the acute  phase of infection. Positive results are indicative of active infection with SARS-CoV-2. Clinical  correlation with patient history and other diagnostic information is necessary to determine patient infection status. Positive results do  not rule out bacterial infection or co-infection with other viruses. The expected result is Negative. Fact Sheet for Patients: SugarRoll.be Fact Sheet for Healthcare Providers: https://www.woods-mathews.com/ This test is not yet approved or cleared by the [place] FDA and  has been authorized for detection and/or diagnosis of SARS-CoV-2 by FDA under an Emergency Use Authorization (EUA). This EUA will remain  in effect (meaning this test can be used) for the duration of the COVID-19 declaration under Section 564(b)(1) of the Act, 21 U.S.C.  section 360bbb-3(b)(1), unless the authorization is terminated or revoked sooner. Performed at [facility], [place] [address] [name], [place] [address]   MRSA PCR Screening     Status: None   Collection Time: 05/11/19 12:37 AM   Specimen: Nasal Mucosa; Nasopharyngeal  Result Value Ref Range Status   MRSA by PCR NEGATIVE NEGATIVE Final    Comment:        The GeneXpert MRSA Assay (FDA approved for NASAL specimens only), is one component of a comprehensive MRSA colonization surveillance program. It is not intended to diagnose MRSA infection nor to guide or monitor treatment for MRSA infections. Performed at [facility], [place] [address], [place], [place] [address]       Radiology Studies: No results found.   Scheduled Meds: . amoxicillin  500 mg Oral Q12H  . busPIRone  15 mg Oral BID  . Chlorhexidine Gluconate Cloth  6 each Topical Daily  . cholecalciferol  1,000 Units Oral Daily  . clomiPHENE  25 mg Oral QODAY  . docusate sodium  100 mg Oral BID  . DULoxetine  60 mg Oral Daily  . fentaNYL  1 patch Transdermal Q72H  . insulin aspart  0-20  Units Subcutaneous TID WC  . insulin aspart  6 Units Subcutaneous TID WC  . insulin glargine  23 Units Subcutaneous BID  . lamoTRIgine  200 mg Oral BID  . levETIRAcetam  1,000 mg Oral BID  . pregabalin  50 mg Oral Q M,W,F  . simvastatin  20 mg Oral QHS  . tamsulosin  0.4 mg Oral Daily  . vitamin C  500 mg Oral Q6H  . warfarin  3 mg Oral ONCE-1800  . Warfarin - Pharmacist Dosing Inpatient   Does not apply q1800  . zinc sulfate  220 mg Oral BID   Continuous Infusions:   LOS: 5 days   Time Spent in minutes   45 minutes (greater than 50% of time spent with patient face to face, as well as reviewing records, calling consults, and formulating a plan)   Cristal Ford D.O. on 05/14/2019 at 12:40 PM  Between 7am to 7pm - Please see pager noted on [email or web address]  After 7pm go to [email or web address]  And look for the night coverage person covering for me after hours  [facility] Office  [phone]

## 2019-05-15 DIAGNOSIS — R569 Unspecified convulsions: Secondary | ICD-10-CM

## 2019-05-15 LAB — BASIC METABOLIC PANEL
Anion gap: 13 (ref 5–15)
BUN: 64 mg/dL — ABNORMAL HIGH (ref 6–20)
CO2: 26 mmol/L (ref 22–32)
Calcium: 8.2 mg/dL — ABNORMAL LOW (ref 8.9–10.3)
Chloride: 98 mmol/L (ref 98–111)
Creatinine, Ser: 4.16 mg/dL — ABNORMAL HIGH (ref 0.61–1.24)
GFR calc Af Amer: 18 mL/min — ABNORMAL LOW (ref >60)
GFR calc non Af Amer: 15 mL/min — ABNORMAL LOW (ref >60)
Glucose, Bld: 239 mg/dL — ABNORMAL HIGH (ref 70–99)
Potassium: 3.5 mmol/L (ref 3.5–5.1)
Sodium: 137 mmol/L (ref 135–145)

## 2019-05-15 LAB — GLUCOSE, CAPILLARY
Glucose-Capillary: 252 mg/dL — ABNORMAL HIGH (ref 70–99)
Glucose-Capillary: 213 mg/dL — ABNORMAL HIGH (ref 70–99)

## 2019-05-15 LAB — PROTIME-INR
INR: 1.7 — ABNORMAL HIGH (ref 0.8–1.2)
Prothrombin Time: 19.6 seconds — ABNORMAL HIGH (ref 11.4–15.2)

## 2019-05-15 LAB — CBC
HCT: 24.6 % — ABNORMAL LOW (ref 39.0–52.0)
Hemoglobin: 7.8 g/dL — ABNORMAL LOW (ref 13.0–17.0)
MCH: 30.2 pg (ref 26.0–34.0)
MCHC: 31.7 g/dL (ref 30.0–36.0)
MCV: 95.3 fL (ref 80.0–100.0)
Platelets: 82 10*3/uL — ABNORMAL LOW (ref 150–400)
RBC: 2.58 MIL/uL — ABNORMAL LOW (ref 4.22–5.81)
RDW: 14.7 % (ref 11.5–15.5)
WBC: 2.5 10*3/uL — ABNORMAL LOW (ref 4.0–10.5)
nRBC: 0 % (ref 0.0–0.2)

## 2019-05-15 MED ORDER — ZINC SULFATE 220 (50 ZN) MG PO CAPS: 220.0000 mg | ORAL_CAPSULE | Freq: Two times a day (BID) | ORAL | Status: AC

## 2019-05-15 MED ORDER — WARFARIN SODIUM 3 MG PO TABS: 3.0000 mg | ORAL_TABLET | Freq: Once | ORAL | 0 refills | Status: DC

## 2019-05-15 MED ORDER — DOCUSATE SODIUM 100 MG PO CAPS: 100.0000 mg | ORAL_CAPSULE | Freq: Two times a day (BID) | ORAL | 0 refills | Status: DC

## 2019-05-15 MED ORDER — AMOXICILLIN 500 MG PO CAPS: 500.0000 mg | ORAL_CAPSULE | Freq: Two times a day (BID) | ORAL | 0 refills | Status: DC

## 2019-05-15 MED ORDER — ASCORBIC ACID 500 MG PO TABS: 500.0000 mg | ORAL_TABLET | Freq: Every day | ORAL | Status: AC

## 2019-05-15 MED ORDER — WARFARIN SODIUM 4 MG PO TABS
4.0000 mg | ORAL_TABLET | Freq: Once | ORAL | Status: DC
  Filled 2019-05-15: qty 1

## 2019-05-15 NOTE — Progress Notes (Signed)
PT Cancellation Note  Patient Details Name: [name] MRN: [id number] DOB: 11/13/1965   Cancelled Treatment:    Reason Eval/Treat Not Completed: Patient declined, no reason specified.  I spoke with pt who reports he is d/c today and does not want to work with therapy prior to leaving.  I also spoke with RN CM, Levada Dy and reinforced resumption of home therapy Alvis Lemmings) and need for ambulance transport home.   Thanks,  Verdene Lennert, PT, DPT  Acute Rehabilitation [phone] pager [phone] office  @ [facility]: [phone]     Harvie Heck 05/15/2019, 11:43 AM

## 2019-05-15 NOTE — Progress Notes (Addendum)
OT Cancellation Note  Patient Details Name: [name] MRN: [id number] DOB: 15-Jan-1966   Cancelled Treatment:    Reason Eval/Treat Not Completed: Patient declined, no reason specified. (Per conversation with PT, pt planning for dc to home today and to resume [facility] for therapy. Declined acute OT/PT at this time.)  [place], OTR/L Acute Rehab Pager: [phone] Office: [phone] 05/15/2019, 2:01 PM

## 2019-05-15 NOTE — Progress Notes (Signed)
Inpatient Rehabilitation Admissions Coordinator  Noted plans for d/c home today. We will sign off.  Danne Baxter, RN, MSN Rehab Admissions Coordinator [phone] 05/15/2019 12:39 PM

## 2019-05-15 NOTE — Progress Notes (Signed)
ANTICOAGULATION CONSULT NOTE - Follow Up Consult  Pharmacy Consult for Coumadin Indication: h/o DVT/PE  Allergies  Allergen Reactions  . Bee Venom Anaphylaxis, Shortness Of Breath and Swelling  . Other Shortness Of Breath    Itching, rash with IVP DYE, iodine, shellfish LATEX  . Shellfish Allergy Nausea And Vomiting and Other (See Comments)    Feels like insides are twisting  . Iodinated Diagnostic Agents     Other reaction(s): RASH  . Iohexol      Code: RASH, Desc: PT WAS ON PREDNISONE FOR GOUT TX. @ TIME OF SCAN AND RECEIVED 50 MG OF BENADRYL IV-ARS 10/08/07   . Iodine Rash  . Latex Rash    Patient Measurements: Height: 6\' 3"  (190.5 cm) Weight: (!) 432 lb 8.7 oz (196.2 kg) IBW/kg (Calculated) : 84.5  Vital Signs: Temp: 98.9 F (37.2 C) (11/16 0546) Temp Source: Oral (11/16 0546) BP: 113/55 (11/16 0546) Pulse Rate: 75 (11/16 0546)  Labs: Recent Labs    05/13/19 0214 05/13/19 1451 05/14/19 0226 05/15/19 0236  HGB 7.1* 7.7* 7.7* 7.8*  HCT 22.1* 23.4* 24.8* 24.6*  PLT 73*  --  78* 82*  LABPROT 18.8*  --  18.4* 19.6*  INR 1.6*  --  1.6* 1.7*  CREATININE 4.74*  --  4.60* 4.16*    Estimated Creatinine Clearance: 38 mL/min (A) (by C-G formula based on SCr of 4.16 mg/dL (H)).  Assessment: Anticoag: Warfarin PTA for Hx DVT/PE. INR on admit 10, received Vit K 7.5mg  po total for reversal (11/10 and 11/11).  - INR 1.7. Chronic thrombocytopenia. Hgb 7.8 low but stable.  *Warfarin dose was reduced to 3mg  daily on recent ED visit but patient was still taking 5mg  daily which may explain high INR 10 on admission.   Goal of Therapy:  INR 2-3 Monitor platelets by anticoagulation protocol: Yes   Plan:  -Warfarin 4mg  po x 1 today. - Daily INR   Devontre Siedschlag S. Alford Highland, PharmD, BCPS Clinical Staff Pharmacist [phone] Eilene Ghazi Stillinger 05/15/2019,9:05 AM

## 2019-05-15 NOTE — Discharge Summary (Signed)
Physician Discharge Summary  [name] [id number] DOB: 1965/12/23 DOA: 05/09/2019  PCP: Sharion Balloon, FNP  Admit date: 05/09/2019 Discharge date: 05/15/2019  Time spent: 45 minutes  Recommendations for Outpatient Follow-up:  Patient will be discharged to home with home health services.  Patient will need to follow up with primary care provider within one week of discharge, repeat CBC and BMP. Check INR in 2 days.  Follow up with nephrology. Patient should continue medications as prescribed.  Patient should follow a renal carb modified diet.   Discharge Diagnoses:  Nonoliguirc Acute kidney injury on chronic kidney injury, stage IV Acute on chronic hypoxic respiratory failure Hypotension Sepsis secondary to E faecalis UTI Covid positive Acute on chronic pancytopenia Pulmonary embolism Diabetes mellitus, type II OSA/OHS  Depression/bipolar disorder Chronic pain syndrome Seizure disorder Hyperlipidemia BPH Right renal cyst Hypokalemia Physical deconditioning Morbid obesity  Discharge Condition: Stable  Diet recommendation: renal/carb modified  Filed Weights   05/09/19 0928 05/09/19 1607 05/11/19 0600  Weight: (!) 192.8 kg (!) 196.3 kg (!) 196.2 kg    History of present illness:  on 05/09/2019 by Dr. Shanon Brow Tat [name] a 53 y.o.malewith medical history ofCKD stage IV, bipolar disorder, chronic respiratory failure on 3 L, diabetes mellitus type 2, DVT, OSA/OHS, pulmonary embolus, seizure disorder, thrombocytopenia, hyperlipidemia, chronic pain syndrome presenting with decreased urine output and urinary frequencyx 1week. The patient feels that after he urinates, he continues to have a sensation of incomplete emptying. He denies any NSAIDs or new medications. The patient is compliant with his furosemide. On 05/08/2019, the patient noted some bloody urine. As result, the patient presented for further evaluation. Notably, the patient had a recent  hospitalization from 04/09/2019 through 04/25/2019 during which time the patient was treated for acute on chronic respiratory failure secondary to COVID-19 pneumonia. Notably, the patient had a repeat negative Covid test on 05/03/2019. He denies any fevers, chills, headache, neck pain, chest pain, shortness of breath, worsening abdominal pain, diarrhea, hematochezia, melena. He states that he has chronic back pain.  Hospital Course:  Nonoliguirc Acute kidney injury on chronic kidney injury, stage IV -Creatinine peaked 5.49, now down to 4.6 (baseline creatinine 3.2-4.4 per nephrology) -Nephrology consulted and appreciated, and has now signed off -Suspect kidney injury likely ischemic ATN related to hypotension from sepsis caused by UTI -of note, patient was placed on IV lasix -Currently has good urine output -Renal ultrasound shows atrophic left kidney -Patient to follow-up with nephrology as an outpatient -foley catheter discontinued -Discussed with Dr. Hollie Salk, ok to restart lasix 80mg  daily on discharge -Creatinine today 4.16  Acute on chronic hypoxic respiratory failure -Patient uses 3 to 4 L of oxygen at home at baseline -Initially, patient was found to have hypoxia 83 to 88% on 6 L of nasal cannula -Currently appears to be on his baseline oxygen -Chest x-ray on 05/11/2019 showed bilateral peripheral and subpleural airspace densities similar or minimally improved compared to previous x-ray compared on 04/19/2019 -Of note, patient did have recent Covid infection  Hypotension -resolved  Sepsis secondary to E faecalis UTI -Sepsis physiology is resolved- suspect this was on admission as patient appears to have had leukopenia with tachypnea. Patient later developed hypotension. -Blood cultures show no growth to date -Patient was on aztreonam and transition to daptomycin. -Oral challenge of penicillin was tolerated with amoxicillin -Infectious disease consulted and  appreciated  Covid positive -Patient was initially Covid positive on 04/09/2019.  Retesting on 05/03/2019 was negative.  However on 05/09/2019 patient  was positive again.  Infectious disease felt that 05/03/2019 test was not accurate and it will likely take patient to several months to be negative. -Currently no new infection and no isolation precautions needed  Acute on chronic pancytopenia -Appears stable -patient received 1u PRBC on 05/13/2019 -hemoglobin currently stable 7.8 -platelets stable, 82 -repeat CBC in one week  Pulmonary embolism -Complicated by supratherapeutic INR -INR peaked at 10, status post vitamin K -INR now subtherapeutic, currently 1.7 -Pharmacy managing Coumadin.  Currently not being bridged with heparin given his high risk of bleed  Diabetes mellitus, type II -continue home medications on discharge  OSA/OHS  -Continue supplemental oxygen -Patient currently not on CPAP at home   Depression/bipolar disorder -Currently stable, continue BuSpar, Cymbalta  Chronic pain syndrome -Continue Flexeril, Lyrica, Norco -Fentanyl patch held  Seizure disorder -Stable, continue Keppra and Lamictal  Hyperlipidemia -Continue statin  BPH -Stable, continue Flomax  Right renal cyst -Complex on ultrasound, recommended renal MRI which can be done as an outpatient  Hypokalemia -resolved -repeat BMP in one week  Physical deconditioning -Suspect secondary to multifactorial causes including sepsis, recent and frequent hospitalization with Covid, acute kidney injury, morbid obesity. -PT recommending CIR for home health -OT consulted and appreciated -Patient rehab consulted and appreciated -discussed with patient and wife, they wish to return home with home health  Morbid obesity -BMI 54.06 -Patient will need to follow-up with his PCP to discuss lifestyle modifications and interventions.  Consultants Nephrology Infectious disease PCCM Inpatient  rehab  Procedures  Renal US  Discharge Exam: Vitals:   05/14/19 2125 05/15/19 0546  BP: (!) 114/54 (!) 113/55  Pulse: 78 75  Resp:    Temp: 98.3 F (36.8 C) 98.9 F (37.2 C)  SpO2: 90% (!) 89%     General: Well developed, chronically ill appearing, NAD  HEENT: NCAT, mucous membranes moist.  Cardiovascular: S1 S2 auscultated, distant heart sounds, no murmur appreciated, RRR  Respiratory: Clear to auscultation bilaterally  Abdomen: Soft, nontender, nondistended, + bowel sounds  Extremities: warm dry without cyanosis clubbing or edema  Neuro: AAOx3, nonfocal  Psych: Appropriate   Discharge Instructions Discharge Instructions    Discharge instructions   Complete by: As directed    Patient will be discharged to home with home health services.  Patient will need to follow up with primary care provider within one week of discharge, repeat CBC and BMP. Check INR in 2 days. Follow up with nephrology. Patient should continue medications as prescribed.  Patient should follow a renal carb modified diet.     Allergies as of 05/15/2019      Reactions   Bee Venom Anaphylaxis, Shortness Of Breath, Swelling   Other Shortness Of Breath   Itching, rash with IVP DYE, iodine, shellfish LATEX   Shellfish Allergy Nausea And Vomiting, Other (See Comments)   Feels like insides are twisting   Iodinated Diagnostic Agents    Other reaction(s): RASH   Iohexol     Code: RASH, Desc: PT WAS ON PREDNISONE FOR GOUT TX. @ TIME OF SCAN AND RECEIVED 50 MG OF BENADRYL IV-ARS 10/08/07   Iodine Rash   Latex Rash      Medication List    TAKE these medications   albuterol 108 (90 Base) MCG/ACT inhaler Commonly known as: VENTOLIN HFA TAKE 2 PUFFS BY MOUTH EVERY 6 HOURS AS NEEDED FOR WHEEZE OR SHORTNESS OF BREATH What changed: See the new instructions.   amoxicillin 500 MG capsule Commonly known as: AMOXIL Take 1 capsule (500  mg total) by mouth every 12 (twelve) hours. Resumption of hospital  course   ascorbic acid 500 MG tablet Commonly known as: VITAMIN C Take 1 tablet (500 mg total) by mouth daily.   busPIRone 15 MG tablet Commonly known as: BUSPAR TAKE 1 TABLET (15 MG TOTAL) BY MOUTH 2 (TWO) TIMES DAILY. What changed: See the new instructions.   cetirizine 10 MG tablet Commonly known as: ZYRTEC Take 1 tablet (10 mg total) by mouth daily.   clomiPHENE 50 MG tablet Commonly known as: CLOMID Take 0.5 tablets (25 mg total) by mouth every other day.   cyclobenzaprine 10 MG tablet Commonly known as: FLEXERIL TAKE 1/2 TO 1 TABLET BY MOUTH AS NEEDED AT BEDTIME FOR MUSCLE CRAMPS / SPASMS What changed:   how much to take  how to take this  when to take this  reasons to take this   docusate sodium 100 MG capsule Commonly known as: COLACE Take 1 capsule (100 mg total) by mouth 2 (two) times daily.   DULoxetine 60 MG capsule Commonly known as: CYMBALTA TAKE 1 CAPSULE BY MOUTH EVERY DAY What changed: how much to take   Emgality 120 MG/ML Soaj Generic drug: Galcanezumab-gnlm Inject 120 mg as directed every 30 (thirty) days.   EpiPen 2-Pak 0.3 mg/0.3 mL Soaj injection Generic drug: EPINEPHrine INJECT 0.3 MLS (0.3 MG TOTAL) INTO THE MUSCLE ONCE. AS NEEDED FOR ANAPHYLACTIC REACTION   esomeprazole 40 MG capsule Commonly known as: NEXIUM TAKE 1 CAPSULE (40 MG TOTAL) BY MOUTH DAILY AT 12 NOON.   fentaNYL 75 MCG/HR Commonly known as: [place] 1 patch onto the skin every 3 (three) days.   fluticasone 50 MCG/ACT nasal spray Commonly known as: FLONASE PLACE 1 SPRAY INTO BOTH NOSTRILS 2 (TWO) TIMES DAILY AS NEEDED FOR ALLERGIES.   furosemide 80 MG tablet Commonly known as: LASIX TAKE 1 TABLET (80 MG TOTAL) BY MOUTH 2 (TWO) TIMES DAILY. What changed: when to take this   HYDROcodone-acetaminophen 7.5-325 MG tablet Commonly known as: Norco Take 1 tablet by mouth every 6 (six) hours as needed for moderate pain.   insulin lispro 100 UNIT/ML  KwikPen Commonly known as: HUMALOG Inject 0.25 mLs (25 Units total) into the skin 3 (three) times daily. What changed: how much to take   Jardiance 25 MG Tabs tablet Generic drug: empagliflozin Take 25 mg by mouth daily.   Klor-Con M10 10 MEQ tablet Generic drug: potassium chloride TAKE 3 TABLETS (30 MEQ TOTAL) BY MOUTH DAILY. What changed: See the new instructions.   lamoTRIgine 200 MG tablet Commonly known as: LAMICTAL TAKE 1 TABLET BY MOUTH TWICE A DAY   levETIRAcetam 1000 MG tablet Commonly known as: KEPPRA Take 1 tablet (1,000 mg total) by mouth 2 (two) times daily.   pregabalin 50 MG capsule Commonly known as: LYRICA TAKE 1 CAPSULE BY MOUTH THREE TIMES A DAY What changed: See the new instructions.   rizatriptan 10 MG tablet Commonly known as: MAXALT May repeat in 2 hours if needed What changed:   how much to take  how to take this  when to take this  reasons to take this   simvastatin 20 MG tablet Commonly known as: ZOCOR Take 1 tablet (20 mg total) by mouth at bedtime.   tamsulosin 0.4 MG Caps capsule Commonly known as: FLOMAX TAKE 1 CAPSULE BY MOUTH EVERY DAY What changed: additional instructions   Toujeo SoloStar 300 UNIT/ML Sopn Generic drug: Insulin Glargine (1 Unit Dial) Inject 60 Units into the skin  2 (two) times daily. What changed:   how much to take  when to take this   Trulicity 1.5 0000000 Sopn Generic drug: Dulaglutide Inject 1.5 mg as directed once a week.   VITAMIN D PO Take 1 tablet by mouth once a week.   warfarin 3 MG tablet Commonly known as: COUMADIN Take as directed. If you are unsure how to take this medication, talk to your nurse or doctor. Original instructions: Take 1 tablet (3 mg total) by mouth one time only at 6 PM. What changed: how much to take   zinc sulfate 220 (50 Zn) MG capsule Take 1 capsule (220 mg total) by mouth 2 (two) times daily.      Allergies  Allergen Reactions   Bee Venom Anaphylaxis,  Shortness Of Breath and Swelling   Other Shortness Of Breath    Itching, rash with IVP DYE, iodine, shellfish LATEX   Shellfish Allergy Nausea And Vomiting and Other (See Comments)    Feels like insides are twisting   Iodinated Diagnostic Agents     Other reaction(s): RASH   Iohexol      Code: RASH, Desc: PT WAS ON PREDNISONE FOR GOUT TX. @ TIME OF SCAN AND RECEIVED 50 MG OF BENADRYL IV-ARS 10/08/07    Iodine Rash   Latex Rash      The results of significant diagnostics from this hospitalization (including imaging, microbiology, ancillary and laboratory) are listed below for reference.    Significant Diagnostic Studies: US Renal  Result Date: 05/11/2019 CLINICAL DATA:  Acute renal injury. EXAM: RENAL / URINARY TRACT ULTRASOUND COMPLETE COMPARISON:  Ultrasound 02/21/2019.  CT 12/28/2018. FINDINGS: Right Kidney: Renal measurements: 14.1 x 6.8 x 8.8 cm = volume: 441.5 mL . Echogenicity within normal limits. 3.1 cm complex right renal cyst. Although this appears smaller than noted on prior study, on today's exam a slightly thickened septation is noted within this cyst. No hydronephrosis visualized. Left Kidney: Not visualized. Atrophic left kidney noted on prior CT of 12/28/2018. Bladder: Appears normal for degree of bladder distention. Other: Exam limited due to patient's body habitus. IMPRESSION: 1. 3.1 cm complex right renal cyst is noted. Although this appears smaller than on prior study, on today's exam with slightly thickened septation is noted within the cyst. Further evaluation with renal MRI should be considered. 2.  Left renal atrophy. Electronically Signed   By: Marcello Moores  Register   On: 05/11/2019 12:29   Dg Chest Port 1 View  Result Date: 05/11/2019 CLINICAL DATA:  53 year old male with decreased urination and hematuria. History of COVID-19. EXAM: PORTABLE CHEST 1 VIEW COMPARISON:  Chest radiograph dated 04/19/2019. FINDINGS: Bilateral peripheral and subpleural airspace  densities similar or minimally improved. No new consolidative changes. There is no pleural effusion or pneumothorax. Stable cardiomegaly. No acute osseous pathology. IMPRESSION: Bilateral peripheral and subpleural airspace densities similar or minimally improved compared to the prior radiograph. Electronically Signed   By: Anner Crete M.D.   On: 05/11/2019 14:58   Dg Chest Port 1 View  Result Date: 04/19/2019 CLINICAL DATA:  Hypoxia. COVID-19 pneumonia. EXAM: PORTABLE CHEST 1 VIEW COMPARISON:  One-view chest x-ray 04/09/2019 FINDINGS: Peripheral airspace opacities are again seen bilaterally. Greatest densities in the right upper lobe. Overall aeration is improved. IMPRESSION: 1. Shifting appearance of extensive bilateral airspace disease compatible with pneumonia. 2. Overall aeration is improved with some residual disease in the right upper lobe. Electronically Signed   By: San Morelle M.D.   On: 04/19/2019 12:37  Microbiology: Recent Results (from the past 240 hour(s))  Urine culture     Status: Abnormal   Collection Time: 05/09/19  9:38 AM   Specimen: Urine, Clean Catch  Result Value Ref Range Status   Specimen Description   Final    URINE, CLEAN CATCH Performed at [facility], [address], [place], [place] [address]    Special Requests   Final    NONE Performed at [facility], [address] [place], [place] [address]    Culture >=100,000 COLONIES/mL ENTEROCOCCUS FAECALIS (A)  Final   Report Status 05/11/2019 FINAL  Final   Organism ID, Bacteria ENTEROCOCCUS FAECALIS (A)  Final      Susceptibility   Enterococcus faecalis - MIC*    AMPICILLIN <=2 SENSITIVE Sensitive     LEVOFLOXACIN 1 SENSITIVE Sensitive     NITROFURANTOIN <=16 SENSITIVE Sensitive     VANCOMYCIN 1 SENSITIVE Sensitive     * >=100,000 COLONIES/mL ENTEROCOCCUS FAECALIS  SARS CORONAVIRUS 2 (TAT 6-24 HRS) Nasopharyngeal Nasopharyngeal Swab     Status: Abnormal   Collection Time: 05/09/19 10:39 AM    Specimen: Nasopharyngeal Swab  Result Value Ref Range Status   SARS Coronavirus 2 POSITIVE (A) NEGATIVE Final    Comment: (NOTE) SARS-CoV-2 target nucleic acids are DETECTED. The SARS-CoV-2 RNA is generally detectable in upper and lower respiratory specimens during the acute phase of infection. Positive results are indicative of active infection with SARS-CoV-2. Clinical  correlation with patient history and other diagnostic information is necessary to determine patient infection status. Positive results do  not rule out bacterial infection or co-infection with other viruses. The expected result is Negative. Fact Sheet for Patients: SugarRoll.be Fact Sheet for Healthcare Providers: https://www.woods-mathews.com/ This test is not yet approved or cleared by the [place] FDA and  has been authorized for detection and/or diagnosis of SARS-CoV-2 by FDA under an Emergency Use Authorization (EUA). This EUA will remain  in effect (meaning this test can be used) for the duration of the COVID-19 declaration under Section 564(b)(1) of the Act, 21 U.S.C.  section 360bbb-3(b)(1), unless the authorization is terminated or revoked sooner. Performed at [facility], [place] [address] [place], [place] [address]   MRSA PCR Screening     Status: None   Collection Time: 05/11/19 12:37 AM   Specimen: Nasal Mucosa; Nasopharyngeal  Result Value Ref Range Status   MRSA by PCR NEGATIVE NEGATIVE Final    Comment:        The GeneXpert MRSA Assay (FDA approved for NASAL specimens only), is one component of a comprehensive MRSA colonization surveillance program. It is not intended to diagnose MRSA infection nor to guide or monitor treatment for MRSA infections. Performed at [facility], [place] [address] [place],  [address]      Labs: Basic Metabolic Panel: Recent Labs  Lab 05/10/19 0604 05/11/19 1237 05/12/19 0427 05/13/19 0214  05/14/19 0226 05/15/19 0236  NA 140 135 135 136 137 137  K 4.4 3.8 3.8 3.5 3.4* 3.5  CL 101 97* 96* 99 98 98  CO2 29 26 26 27 28 26   GLUCOSE 181* 219* 267* 280* 281* 239*  BUN 61* 67* 69* 68* 67* 64*  CREATININE 5.17* 5.49* 5.29* 4.74* 4.60* 4.16*  CALCIUM 8.3* 8.2* 7.9* 8.1* 8.2* 8.2*  PHOS 5.0*  --   --   --   --   --    Liver Function Tests: Recent Labs  Lab 05/09/19 0959 05/10/19 0604  AST  27  --   ALT 14  --   ALKPHOS 65  --   BILITOT 0.8  --   PROT 6.8  --   ALBUMIN 2.4* 2.3*   No results for input(s): LIPASE, AMYLASE in the last 168 hours. No results for input(s): AMMONIA in the last 168 hours. CBC: Recent Labs  Lab 05/09/19 0959 05/10/19 0604 05/12/19 0427 05/13/19 0214 05/13/19 1451 05/14/19 0226 05/15/19 0236  WBC 3.1* 3.4* 3.1* 2.6*  --  2.6* 2.5*  NEUTROABS 1.5*  --  2.0 1.6*  --  1.5*  --   HGB 8.2* 8.1* 7.1* 7.1* 7.7* 7.7* 7.8*  HCT 27.9* 27.1* 22.6* 22.1* 23.4* 24.8* 24.6*  MCV 101.1* 100.4* 97.0 95.7  --  97.6 95.3  PLT 77* 79* 72* 73*  --  78* 82*   Cardiac Enzymes: No results for input(s): CKTOTAL, CKMB, CKMBINDEX, TROPONINI in the last 168 hours. BNP: BNP (last 3 results) Recent Labs    04/18/19 0315 04/20/19 0215 04/24/19 0145  BNP 18.1 34.6 137.9*    ProBNP (last 3 results) No results for input(s): PROBNP in the last 8760 hours.  CBG: Recent Labs  Lab 05/14/19 0757 05/14/19 1203 05/14/19 1717 05/14/19 2156 05/15/19 0758  GLUCAP 238* 211* 178* 258* 252*       Signed:  Andra Heslin  [facility] 05/15/2019, 10:22 AM

## 2019-05-15 NOTE — Telephone Encounter (Addendum)
05/15/2019 Patient is currently admitted to [facility] - was admitted 05/09/19 and is still in patient. Closing appointment request until patient is discharge.

## 2019-05-15 NOTE — Progress Notes (Signed)
Inpatient Diabetes Program Recommendations  AACE/ADA: New Consensus Statement on Inpatient Glycemic Control (2015)  Target Ranges:  Prepandial:   less than 140 mg/dL      Peak postprandial:   less than 180 mg/dL (1-2 hours)      Critically ill patients:  140 - 180 mg/dL   Lab Results  Component Value Date   GLUCAP 252 (H) 05/15/2019   HGBA1C 7.9 (H) 04/10/2019    Review of Glycemic Control Results for SAVON, VONBARGEN (MRN [id number]) as of 05/15/2019 09:43  Ref. Range 05/14/2019 07:57 05/14/2019 12:03 05/14/2019 17:17 05/14/2019 21:56 05/15/2019 07:58  Glucose-Capillary Latest Ref Range: 70 - 99 mg/dL 238 (H) 211 (H) 178 (H) 258 (H) 252 (H)   Diabetes history: DM 2 Outpatient Diabetes medications:  Toujeo 60 units bid (per Medication reconciliation patient was taking up to 200 units daily) Humalog 25 units tid with meals (patient taking up to 50 units tid with meals) Current orders for Inpatient glycemic control:  Lantus 23 units bid Novolog Resistant 0-20 units tid with meals Novolog 10 units tid meal coverage  Inpatient Diabetes Program Recommendations:   Please increase Lantus to 28 units bid   Thanks  Tama Headings RN, MSN, BC-ADM Inpatient Diabetes Coordinator Team Pager [phone] (8a-5p)

## 2019-05-15 NOTE — Consult Note (Signed)
   [facility] CM Inpatient Consult   05/15/2019  [name] 11/25/1965 [id number]  Patient screened for extreme high risk score for unplanned readmission score of 50%  and for less than 30 day re-hospitalizations.  Review of patient's medical record reveals patient is in a [facility] providers' office which provides the transition of care and offer care management.  Call placed to patient's room x 2.  Call the nursing station and unable to reach patient by phone to encourage him to follow up with primary care provider for care management program and follow up.  Primary Care Provider is  Jacques Navy, FNP [facility]  Plan: Follow up with [place] Nurse to assess for post hospital care management follow up.   For questions contact:   [name], RN BSN [facility] Liaison  [phone] business mobile phone Toll free office [phone]  Fax number: [phone] [email or web address]

## 2019-05-15 NOTE — Progress Notes (Signed)
Pt discharged from unit. Telemetry dc'd. IV removed. Pt states understanding of UTI and medication education. Pt in care of ambulance transport to home.

## 2019-05-15 NOTE — TOC Transition Note (Addendum)
Transition of Care [facility]) - CM/SW Discharge Note   Patient Details  Name: [name] MRN: [id number] Date of Birth: Jul 22, 1965  [facility] CM/SW Contact:  Sharin Mons, RN Phone Number: 05/15/2019, 1:01 PM   Clinical Narrative:    Per MD pt ready for d/c to next LOC, home.Pt will transition to home with the resumption of home health services provided by [facility], RN, PT,OT, NA.  RN, patient, patient's family aware of d/c plan. Transportation forms on front chart. Ambulance transport/ PTAR requested for patient.   Rannie Georgiades (Spouse)     [phone]       RNCM will sign off for now as intervention is no longer needed. Please consult [place] again if new needs arise.  Final next level of care: [place] Barriers to Discharge: No Barriers Identified   Patient Goals and CMS Choice        Discharge Placement     Discharge Plan and Services In-house Referral: Clinical Social Work     Social Determinants of Health (SDOH) Interventions     Readmission Risk Interventions Readmission Risk Prevention Plan 05/10/2019  Transportation Screening Complete  Medication Review Press photographer) Complete  Palliative Care Screening Not [place] Not Applicable  Some recent data might be hidden

## 2019-05-16 ENCOUNTER — Other Ambulatory Visit: Payer: Self-pay

## 2019-05-16 ENCOUNTER — Ambulatory Visit (INDEPENDENT_AMBULATORY_CARE_PROVIDER_SITE_OTHER): Payer: Medicare Other

## 2019-05-16 DIAGNOSIS — Z9981 Dependence on supplemental oxygen: Secondary | ICD-10-CM

## 2019-05-16 DIAGNOSIS — Z9089 Acquired absence of other organs: Secondary | ICD-10-CM

## 2019-05-16 DIAGNOSIS — F419 Anxiety disorder, unspecified: Secondary | ICD-10-CM

## 2019-05-16 DIAGNOSIS — J9621 Acute and chronic respiratory failure with hypoxia: Secondary | ICD-10-CM | POA: Diagnosis not present

## 2019-05-16 DIAGNOSIS — Z79891 Long term (current) use of opiate analgesic: Secondary | ICD-10-CM

## 2019-05-16 DIAGNOSIS — J1289 Other viral pneumonia: Secondary | ICD-10-CM

## 2019-05-16 DIAGNOSIS — Z7901 Long term (current) use of anticoagulants: Secondary | ICD-10-CM

## 2019-05-16 DIAGNOSIS — E1122 Type 2 diabetes mellitus with diabetic chronic kidney disease: Secondary | ICD-10-CM

## 2019-05-16 DIAGNOSIS — Z974 Presence of external hearing-aid: Secondary | ICD-10-CM

## 2019-05-16 DIAGNOSIS — K219 Gastro-esophageal reflux disease without esophagitis: Secondary | ICD-10-CM

## 2019-05-16 DIAGNOSIS — N170 Acute kidney failure with tubular necrosis: Secondary | ICD-10-CM

## 2019-05-16 DIAGNOSIS — D631 Anemia in chronic kidney disease: Secondary | ICD-10-CM

## 2019-05-16 DIAGNOSIS — Z9861 Coronary angioplasty status: Secondary | ICD-10-CM

## 2019-05-16 DIAGNOSIS — R569 Unspecified convulsions: Secondary | ICD-10-CM

## 2019-05-16 DIAGNOSIS — M159 Polyosteoarthritis, unspecified: Secondary | ICD-10-CM | POA: Diagnosis not present

## 2019-05-16 DIAGNOSIS — H9190 Unspecified hearing loss, unspecified ear: Secondary | ICD-10-CM

## 2019-05-16 DIAGNOSIS — E872 Acidosis: Secondary | ICD-10-CM

## 2019-05-16 DIAGNOSIS — Z794 Long term (current) use of insulin: Secondary | ICD-10-CM

## 2019-05-16 DIAGNOSIS — Z86711 Personal history of pulmonary embolism: Secondary | ICD-10-CM

## 2019-05-16 DIAGNOSIS — E1165 Type 2 diabetes mellitus with hyperglycemia: Secondary | ICD-10-CM

## 2019-05-16 DIAGNOSIS — N184 Chronic kidney disease, stage 4 (severe): Secondary | ICD-10-CM

## 2019-05-16 DIAGNOSIS — R161 Splenomegaly, not elsewhere classified: Secondary | ICD-10-CM

## 2019-05-16 DIAGNOSIS — E785 Hyperlipidemia, unspecified: Secondary | ICD-10-CM

## 2019-05-16 DIAGNOSIS — D696 Thrombocytopenia, unspecified: Secondary | ICD-10-CM

## 2019-05-16 DIAGNOSIS — G894 Chronic pain syndrome: Secondary | ICD-10-CM

## 2019-05-16 DIAGNOSIS — J9612 Chronic respiratory failure with hypercapnia: Secondary | ICD-10-CM

## 2019-05-16 DIAGNOSIS — Z9049 Acquired absence of other specified parts of digestive tract: Secondary | ICD-10-CM

## 2019-05-16 DIAGNOSIS — E1140 Type 2 diabetes mellitus with diabetic neuropathy, unspecified: Secondary | ICD-10-CM

## 2019-05-16 DIAGNOSIS — Z9181 History of falling: Secondary | ICD-10-CM

## 2019-05-16 DIAGNOSIS — Z7951 Long term (current) use of inhaled steroids: Secondary | ICD-10-CM

## 2019-05-16 DIAGNOSIS — U071 COVID-19: Secondary | ICD-10-CM | POA: Diagnosis not present

## 2019-05-16 DIAGNOSIS — N400 Enlarged prostate without lower urinary tract symptoms: Secondary | ICD-10-CM

## 2019-05-16 DIAGNOSIS — G43909 Migraine, unspecified, not intractable, without status migrainosus: Secondary | ICD-10-CM

## 2019-05-16 DIAGNOSIS — G4733 Obstructive sleep apnea (adult) (pediatric): Secondary | ICD-10-CM

## 2019-05-16 DIAGNOSIS — Z79899 Other long term (current) drug therapy: Secondary | ICD-10-CM

## 2019-05-16 DIAGNOSIS — G709 Myoneural disorder, unspecified: Secondary | ICD-10-CM

## 2019-05-16 DIAGNOSIS — Z86718 Personal history of other venous thrombosis and embolism: Secondary | ICD-10-CM

## 2019-05-16 DIAGNOSIS — Z6841 Body Mass Index (BMI) 40.0 and over, adult: Secondary | ICD-10-CM

## 2019-05-16 DIAGNOSIS — F319 Bipolar disorder, unspecified: Secondary | ICD-10-CM

## 2019-05-16 DIAGNOSIS — E662 Morbid (severe) obesity with alveolar hypoventilation: Secondary | ICD-10-CM

## 2019-05-16 NOTE — Care Management Important Message (Signed)
Important Message  Patient Details  Name: KELBY PUENTE MRN: [id number] Date of Birth: 1965-07-26   Medicare Important Message Given:  Yes     Shaquina Gillham 05/16/2019, 11:41 AM

## 2019-05-17 ENCOUNTER — Ambulatory Visit: Payer: Self-pay | Admitting: *Deleted

## 2019-05-17 DIAGNOSIS — J9621 Acute and chronic respiratory failure with hypoxia: Secondary | ICD-10-CM | POA: Diagnosis not present

## 2019-05-17 DIAGNOSIS — U071 COVID-19: Secondary | ICD-10-CM | POA: Diagnosis not present

## 2019-05-17 DIAGNOSIS — E1165 Type 2 diabetes mellitus with hyperglycemia: Secondary | ICD-10-CM | POA: Diagnosis not present

## 2019-05-17 DIAGNOSIS — M159 Polyosteoarthritis, unspecified: Secondary | ICD-10-CM | POA: Diagnosis not present

## 2019-05-17 DIAGNOSIS — J1289 Other viral pneumonia: Secondary | ICD-10-CM | POA: Diagnosis not present

## 2019-05-17 NOTE — Chronic Care Management (AMB) (Addendum)
  Care Management   Inpatient Referral Review   05/17/2019 Name: [name] MRN: [id number] DOB: 11/25/1965  Inpatient referral received on Mr Delrosario who has been recommended for CCM services because of multiple chronic medical conditions. Patient had a hospital follow-up with PCP yesterday.   Follow up plan: Telephone follow up appointment with care management team member scheduled for:06/14/2019  Chong Sicilian, BSN, RN-BC [place] [facility] [place] Management Direct Dial: [phone]   I have reviewed and agree with the above documentation.   Evelina Dun, FNP

## 2019-05-18 DIAGNOSIS — M159 Polyosteoarthritis, unspecified: Secondary | ICD-10-CM | POA: Diagnosis not present

## 2019-05-18 DIAGNOSIS — J1289 Other viral pneumonia: Secondary | ICD-10-CM | POA: Diagnosis not present

## 2019-05-18 DIAGNOSIS — J9621 Acute and chronic respiratory failure with hypoxia: Secondary | ICD-10-CM | POA: Diagnosis not present

## 2019-05-18 DIAGNOSIS — E1165 Type 2 diabetes mellitus with hyperglycemia: Secondary | ICD-10-CM | POA: Diagnosis not present

## 2019-05-18 DIAGNOSIS — U071 COVID-19: Secondary | ICD-10-CM | POA: Diagnosis not present

## 2019-05-19 ENCOUNTER — Ambulatory Visit (INDEPENDENT_AMBULATORY_CARE_PROVIDER_SITE_OTHER): Payer: Medicare Other | Admitting: Family Medicine

## 2019-05-19 ENCOUNTER — Encounter: Payer: Self-pay | Admitting: Family Medicine

## 2019-05-19 DIAGNOSIS — R3989 Other symptoms and signs involving the genitourinary system: Secondary | ICD-10-CM

## 2019-05-19 MED ORDER — CEFDINIR 300 MG PO CAPS: 300.0000 mg | ORAL_CAPSULE | Freq: Every day | ORAL | 0 refills | Status: AC

## 2019-05-19 NOTE — Progress Notes (Signed)
Virtual Visit via Telephone Note  I connected with [name] on 05/19/19 at 1:43 PM by telephone and verified that I am speaking with the correct person using two identifiers. [name] is currently located at home and nobody is currently with him during this visit. The provider, Loman Brooklyn, FNP is located in their office at time of visit.  I discussed the limitations, risks, security and privacy concerns of performing an evaluation and management service by telephone and the availability of in person appointments. I also discussed with the patient that there may be a patient responsible charge related to this service. The patient expressed understanding and agreed to proceed.  Subjective: PCP: Sharion Balloon, FNP  Chief Complaint  Patient presents with   Urinary Tract Infection   Urinary Tract Infection: Patient complains of dysuria and decreased urine flow He has had symptoms for 1 day. He also feels like his blood is "wonky ". When asked to explain he states that he is feeling tired, drained, is having muscle cramps, can't sit up due to weakness, and his tremors are worse. Patient denies back pain and fever. Patient does have a history of recurrent UTI. Patient was recently hospitalized earlier this month due to sepsis due to UTI. At that time nephrology and infectious disease was consulted. He suffered from an acute kidney injury on chronic kidney injury stage IV. He states that he was going to have to do 24 hours of dialysis in the hospital if his kidney function did not improve, but it did.   ROS: Per HPI  Current Outpatient Medications:    albuterol (VENTOLIN HFA) 108 (90 Base) MCG/ACT inhaler, TAKE 2 PUFFS BY MOUTH EVERY 6 HOURS AS NEEDED FOR WHEEZE OR SHORTNESS OF BREATH (Patient taking differently: Inhale 2 puffs into the lungs every 6 (six) hours as needed for shortness of breath. ), Disp: 18 g, Rfl: 1   busPIRone (BUSPAR) 15 MG tablet, TAKE 1 TABLET (15  MG TOTAL) BY MOUTH 2 (TWO) TIMES DAILY. (Patient taking differently: Take 15 mg by mouth 2 (two) times daily. TAKE 1 TABLET (15 MG TOTAL) BY MOUTH 2 (TWO) TIMES DAILY.), Disp: 180 tablet, Rfl: 0   calcitRIOL (ROCALTROL) 0.25 MCG capsule, Take 0.25 mcg by mouth daily., Disp: , Rfl:    cetirizine (ZYRTEC) 10 MG tablet, Take 1 tablet (10 mg total) by mouth daily., Disp: 30 tablet, Rfl: 11   clomiPHENE (CLOMID) 50 MG tablet, Take 0.5 tablets (25 mg total) by mouth every other day., Disp: 24 tablet, Rfl: 3   cyclobenzaprine (FLEXERIL) 10 MG tablet, TAKE 1/2 TO 1 TABLET BY MOUTH AS NEEDED AT BEDTIME FOR MUSCLE CRAMPS / SPASMS (Patient taking differently: Take 10 mg by mouth daily as needed. TAKE 1/2 TO 1 TABLET BY MOUTH AS NEEDED AT BEDTIME FOR MUSCLE CRAMPS / SPASMS), Disp: 90 tablet, Rfl: 3   docusate sodium (COLACE) 100 MG capsule, Take 1 capsule (100 mg total) by mouth 2 (two) times daily., Disp: 10 capsule, Rfl: 0   DULoxetine (CYMBALTA) 60 MG capsule, TAKE 1 CAPSULE BY MOUTH EVERY DAY (Patient taking differently: Take 60 mg by mouth daily. ), Disp: 90 capsule, Rfl: 0   EMGALITY 120 MG/ML SOAJ, Inject 120 mg as directed every 30 (thirty) days. , Disp: , Rfl:    EPIPEN 2-PAK 0.3 MG/0.3ML SOAJ injection, INJECT 0.3 MLS (0.3 MG TOTAL) INTO THE MUSCLE ONCE. AS NEEDED FOR ANAPHYLACTIC REACTION, Disp: 2 Device, Rfl: 2   esomeprazole (NEXIUM)  40 MG capsule, TAKE 1 CAPSULE (40 MG TOTAL) BY MOUTH DAILY AT 12 NOON., Disp: 90 capsule, Rfl: 0   fentaNYL (DURAGESIC) 75 MCG/HR, Place 1 patch onto the skin every 3 (three) days., Disp: 10 patch, Rfl: 0   fluticasone (FLONASE) 50 MCG/ACT nasal spray, PLACE 1 SPRAY INTO BOTH NOSTRILS 2 (TWO) TIMES DAILY AS NEEDED FOR ALLERGIES., Disp: 48 g, Rfl: 4   furosemide (LASIX) 80 MG tablet, TAKE 1 TABLET (80 MG TOTAL) BY MOUTH 2 (TWO) TIMES DAILY. (Patient taking differently: Take 80 mg by mouth daily. ), Disp: 180 tablet, Rfl: 0   HYDROcodone-acetaminophen (NORCO)  7.5-325 MG tablet, Take 1 tablet by mouth every 6 (six) hours as needed for moderate pain., Disp: 90 tablet, Rfl: 0   Insulin Glargine, 1 Unit Dial, (TOUJEO SOLOSTAR) 300 UNIT/ML SOPN, Inject 60 Units into the skin 2 (two) times daily. (Patient taking differently: Inject 200 Units into the skin daily. ), Disp: 130 pen, Rfl: 3   insulin lispro (HUMALOG) 100 UNIT/ML KwikPen, Inject 0.25 mLs (25 Units total) into the skin 3 (three) times daily. (Patient taking differently: Inject 50 Units into the skin 3 (three) times daily. ), Disp: 45 mL, Rfl: 0   JARDIANCE 25 MG TABS tablet, Take 25 mg by mouth daily. , Disp: , Rfl:    KLOR-CON M10 10 MEQ tablet, TAKE 3 TABLETS (30 MEQ TOTAL) BY MOUTH DAILY. (Patient taking differently: Take 30 mEq by mouth daily. ), Disp: 270 tablet, Rfl: 0   lamoTRIgine (LAMICTAL) 200 MG tablet, TAKE 1 TABLET BY MOUTH TWICE A DAY (Patient taking differently: Take 200 mg by mouth 2 (two) times daily. ), Disp: 180 tablet, Rfl: 0   levETIRAcetam (KEPPRA) 1000 MG tablet, Take 1 tablet (1,000 mg total) by mouth 2 (two) times daily., Disp: 180 tablet, Rfl: 3   NARCAN 4 MG/0.1ML LIQD nasal spray kit, SMARTSIG:1 Spray(s) Both Nares As Needed, Disp: , Rfl:    pregabalin (LYRICA) 50 MG capsule, TAKE 1 CAPSULE BY MOUTH THREE TIMES A DAY (Patient taking differently: Take 50 mg by mouth 3 (three) times daily. TAKE 1 CAPSULE BY MOUTH THREE TIMES A DAY), Disp: 270 capsule, Rfl: 1   rizatriptan (MAXALT) 10 MG tablet, May repeat in 2 hours if needed (Patient taking differently: Take 10 mg by mouth as needed. May repeat in 2 hours if needed), Disp: 10 tablet, Rfl: 11   simvastatin (ZOCOR) 20 MG tablet, Take 1 tablet (20 mg total) by mouth at bedtime., Disp: 90 tablet, Rfl: 3   tamsulosin (FLOMAX) 0.4 MG CAPS capsule, TAKE 1 CAPSULE BY MOUTH EVERY DAY (Patient taking differently: Take 0.4 mg by mouth daily. TAKE 1 CAPSULE BY MOUTH EVERY DAY), Disp: 90 capsule, Rfl: 0   TRULICITY 1.5 WE/3.1VQ  SOPN, Inject 1.5 mg as directed once a week. , Disp: , Rfl:    vitamin C (VITAMIN C) 500 MG tablet, Take 1 tablet (500 mg total) by mouth daily., Disp: , Rfl:    VITAMIN D PO, Take 1 tablet by mouth once a week., Disp: , Rfl:    Vitamin D, Ergocalciferol, (DRISDOL) 1.25 MG (50000 UT) CAPS capsule, Take 50,000 Units by mouth once a week., Disp: , Rfl:    warfarin (COUMADIN) 3 MG tablet, Take 1 tablet (3 mg total) by mouth one time only at 6 PM., Disp: 30 tablet, Rfl: 0   zinc sulfate 220 (50 Zn) MG capsule, Take 1 capsule (220 mg total) by mouth 2 (two) times daily., Disp: ,  Rfl:   Allergies  Allergen Reactions   Bee Venom Anaphylaxis, Shortness Of Breath and Swelling   Other Shortness Of Breath    Itching, rash with IVP DYE, iodine, shellfish LATEX   Shellfish Allergy Nausea And Vomiting and Other (See Comments)    Feels like insides are twisting   Iodinated Diagnostic Agents     Other reaction(s): RASH   Iohexol      Code: RASH, Desc: PT WAS ON PREDNISONE FOR GOUT TX. @ TIME OF SCAN AND RECEIVED 50 MG OF BENADRYL IV-ARS 10/08/07    Iodine Rash   Latex Rash   Past Medical History:  Diagnosis Date   Anemia    Anxiety    Arthritis    Bipolar 1 disorder (HCC)    Bronchitis    hx of   Bruises easily    Chronic kidney disease    decreased left kidney fx   Chronic pain syndrome 05/11/2012   Chronic respiratory failure with hypoxia (HCC)    And with hypercapnia   Diabetes mellitus without complication (HCC)    Diabetic neuropathy (HCC)    DVT (deep venous thrombosis) (HCC)    LLE DVT ~ '12   Dyspnea    with ambulation   GERD (gastroesophageal reflux disease)    History of 2019 novel coronavirus disease (COVID-19)    HOH (hard of hearing) 2015   has hearing aids   Mental disorder    Migraine    Neuromuscular disorder (HCC)    Obesity hypoventilation syndrome (HCC)    Obstructive sleep apnea    CPAP   PE (pulmonary embolism)    bilateral  PE ~ '11   Seizures ([place])    Thrombocytopenia ([place]) 05/11/2012    Observations/Objective: A&O  No respiratory distress or wheezing audible over the phone Mood, judgement, and thought processes all WNL  Assessment and Plan: 1. Suspected UTI - I encouraged patient extensively to go to the ER. He has no desire to go to the ER stating that he has been in the hospital for the past month due to at first Covid and then sepsis from the UTI. He asked that I treat him with an antibiotic and if he does not get better or worsens over the weekend he is agreeable to going to the ER at that time. We will call home health and request a urinalysis, urine culture, INR, CMP, and CBC to be completed today. INR to be completed on Tuesday as well. Decreased cefdinir dosage from recommended 300 twice daily down to 300 once daily due to kidney function. - cefdinir (OMNICEF) 300 MG capsule; Take 1 capsule (300 mg total) by mouth daily for 7 days.  Dispense: 7 capsule; Refill: 0   Follow Up Instructions:  I discussed the assessment and treatment plan with the patient. The patient was provided an opportunity to ask questions and all were answered. The patient agreed with the plan and demonstrated an understanding of the instructions.   The patient was advised to call back or seek an in-person evaluation if the symptoms worsen or if the condition fails to improve as anticipated.  The above assessment and management plan was discussed with the patient. The patient verbalized understanding of and has agreed to the management plan. Patient is aware to call the clinic if symptoms persist or worsen. Patient is aware when to return to the clinic for a follow-up visit. Patient educated on when it is appropriate to go to the emergency department.   Time  call ended: 2:04 PM  I provided 25 minutes of non-face-to-face time during this encounter.  Hendricks Limes, MSN, APRN, FNP-C [facility] 05/19/19

## 2019-05-22 ENCOUNTER — Telehealth: Payer: Self-pay | Admitting: *Deleted

## 2019-05-22 DIAGNOSIS — J810 Acute pulmonary edema: Secondary | ICD-10-CM

## 2019-05-22 DIAGNOSIS — E1165 Type 2 diabetes mellitus with hyperglycemia: Secondary | ICD-10-CM | POA: Diagnosis not present

## 2019-05-22 DIAGNOSIS — U071 COVID-19: Secondary | ICD-10-CM | POA: Diagnosis not present

## 2019-05-22 DIAGNOSIS — J9621 Acute and chronic respiratory failure with hypoxia: Secondary | ICD-10-CM | POA: Diagnosis not present

## 2019-05-22 DIAGNOSIS — Z86718 Personal history of other venous thrombosis and embolism: Secondary | ICD-10-CM

## 2019-05-22 DIAGNOSIS — J1289 Other viral pneumonia: Secondary | ICD-10-CM | POA: Diagnosis not present

## 2019-05-22 DIAGNOSIS — M159 Polyosteoarthritis, unspecified: Secondary | ICD-10-CM | POA: Diagnosis not present

## 2019-05-22 NOTE — Telephone Encounter (Signed)
INR 2.9 at today's visit Other labs & UA/CS was collected except CMP, pt was a hard drawn

## 2019-05-22 NOTE — Telephone Encounter (Signed)
Continue current dose of 5 mg (1 tablet) every day.   INR today was 2.9 (at goal) and your goal is 2.0-3.0

## 2019-05-22 NOTE — Telephone Encounter (Signed)
lmtcb

## 2019-05-23 DIAGNOSIS — J449 Chronic obstructive pulmonary disease, unspecified: Secondary | ICD-10-CM | POA: Diagnosis not present

## 2019-05-23 DIAGNOSIS — I279 Pulmonary heart disease, unspecified: Secondary | ICD-10-CM | POA: Diagnosis not present

## 2019-05-23 DIAGNOSIS — G8194 Hemiplegia, unspecified affecting left nondominant side: Secondary | ICD-10-CM | POA: Diagnosis not present

## 2019-05-24 ENCOUNTER — Other Ambulatory Visit: Payer: Self-pay | Admitting: Family

## 2019-05-24 ENCOUNTER — Other Ambulatory Visit: Payer: Self-pay | Admitting: Endocrinology

## 2019-05-24 DIAGNOSIS — J189 Pneumonia, unspecified organism: Secondary | ICD-10-CM

## 2019-05-24 DIAGNOSIS — E1165 Type 2 diabetes mellitus with hyperglycemia: Secondary | ICD-10-CM

## 2019-05-24 DIAGNOSIS — Z794 Long term (current) use of insulin: Secondary | ICD-10-CM

## 2019-05-24 NOTE — Telephone Encounter (Signed)
Chart reviewed, per last note 05/12/2019 patient needs appointment after Dc from Hospital. Patient has been discharged, please contact to set up appointment per Dr. Loanne Drilling request.  Thank you.

## 2019-05-29 DIAGNOSIS — E1122 Type 2 diabetes mellitus with diabetic chronic kidney disease: Secondary | ICD-10-CM | POA: Diagnosis not present

## 2019-05-29 DIAGNOSIS — I129 Hypertensive chronic kidney disease with stage 1 through stage 4 chronic kidney disease, or unspecified chronic kidney disease: Secondary | ICD-10-CM | POA: Diagnosis not present

## 2019-05-29 DIAGNOSIS — R6889 Other general symptoms and signs: Secondary | ICD-10-CM | POA: Diagnosis not present

## 2019-05-29 DIAGNOSIS — D631 Anemia in chronic kidney disease: Secondary | ICD-10-CM | POA: Diagnosis not present

## 2019-05-29 DIAGNOSIS — N183 Chronic kidney disease, stage 3 unspecified: Secondary | ICD-10-CM | POA: Diagnosis not present

## 2019-05-29 NOTE — Telephone Encounter (Signed)
Patient had telephone visit with a provider here on 05-19-19.

## 2019-05-30 DIAGNOSIS — U071 COVID-19: Secondary | ICD-10-CM | POA: Diagnosis not present

## 2019-05-30 DIAGNOSIS — M159 Polyosteoarthritis, unspecified: Secondary | ICD-10-CM | POA: Diagnosis not present

## 2019-05-30 DIAGNOSIS — J9621 Acute and chronic respiratory failure with hypoxia: Secondary | ICD-10-CM | POA: Diagnosis not present

## 2019-05-30 DIAGNOSIS — J1289 Other viral pneumonia: Secondary | ICD-10-CM | POA: Diagnosis not present

## 2019-05-30 DIAGNOSIS — E1165 Type 2 diabetes mellitus with hyperglycemia: Secondary | ICD-10-CM | POA: Diagnosis not present

## 2019-05-31 ENCOUNTER — Other Ambulatory Visit: Payer: Self-pay | Admitting: Family Medicine

## 2019-05-31 DIAGNOSIS — R6889 Other general symptoms and signs: Secondary | ICD-10-CM | POA: Diagnosis not present

## 2019-05-31 DIAGNOSIS — R300 Dysuria: Secondary | ICD-10-CM | POA: Diagnosis not present

## 2019-05-31 DIAGNOSIS — Z79899 Other long term (current) drug therapy: Secondary | ICD-10-CM | POA: Diagnosis not present

## 2019-05-31 DIAGNOSIS — G894 Chronic pain syndrome: Secondary | ICD-10-CM | POA: Diagnosis not present

## 2019-05-31 DIAGNOSIS — Z2821 Immunization not carried out because of patient refusal: Secondary | ICD-10-CM | POA: Diagnosis not present

## 2019-06-01 ENCOUNTER — Other Ambulatory Visit: Payer: Medicare Other

## 2019-06-01 ENCOUNTER — Other Ambulatory Visit: Payer: Self-pay

## 2019-06-01 ENCOUNTER — Telehealth: Payer: Self-pay | Admitting: *Deleted

## 2019-06-01 DIAGNOSIS — E1165 Type 2 diabetes mellitus with hyperglycemia: Secondary | ICD-10-CM | POA: Diagnosis not present

## 2019-06-01 DIAGNOSIS — R300 Dysuria: Secondary | ICD-10-CM

## 2019-06-01 DIAGNOSIS — Z86718 Personal history of other venous thrombosis and embolism: Secondary | ICD-10-CM

## 2019-06-01 DIAGNOSIS — U071 COVID-19: Secondary | ICD-10-CM | POA: Diagnosis not present

## 2019-06-01 DIAGNOSIS — J9621 Acute and chronic respiratory failure with hypoxia: Secondary | ICD-10-CM | POA: Diagnosis not present

## 2019-06-01 DIAGNOSIS — J1289 Other viral pneumonia: Secondary | ICD-10-CM | POA: Diagnosis not present

## 2019-06-01 DIAGNOSIS — J810 Acute pulmonary edema: Secondary | ICD-10-CM

## 2019-06-01 DIAGNOSIS — M159 Polyosteoarthritis, unspecified: Secondary | ICD-10-CM | POA: Diagnosis not present

## 2019-06-01 LAB — MICROSCOPIC EXAMINATION
Epithelial Cells (non renal): NONE SEEN /hpf (ref 0–10)
Renal Epithel, UA: NONE SEEN /hpf
WBC, UA: >30 /hpf — AB (ref 0–5)

## 2019-06-01 LAB — URINALYSIS, COMPLETE
Bilirubin, UA: NEGATIVE
Ketones, UA: NEGATIVE
Nitrite, UA: NEGATIVE
Specific Gravity, UA: 1.015 (ref 1.005–1.030)
Urobilinogen, Ur: 0.2 mg/dL (ref 0.2–1.0)
pH, UA: 6 (ref 5.0–7.5)

## 2019-06-01 MED ORDER — AMOXICILLIN-POT CLAVULANATE 875-125 MG PO TABS: 1.0000 | ORAL_TABLET | Freq: Two times a day (BID) | ORAL | 0 refills | Status: DC

## 2019-06-01 MED ORDER — FUROSEMIDE 80 MG PO TABS: 80.0000 mg | ORAL_TABLET | Freq: Two times a day (BID) | ORAL | 1 refills | Status: DC

## 2019-06-01 NOTE — Addendum Note (Signed)
Addended by: Evelina Dun A on: 06/01/2019 03:54 PM   Modules accepted: Orders

## 2019-06-01 NOTE — Telephone Encounter (Signed)
Continue current dose of 5 mg (1 tablet) every day.   INR today was 2.9 (at goal) and your goal is 2.0-3.0  Urine is positive for infection, urine culture pending. Augmentin Prescription sent to pharmacy. Keep follow up with ID.   Needs follow up in 10 days.   I have sent a new rx of lasix of his current dose of 80 mg BID.

## 2019-06-01 NOTE — Telephone Encounter (Signed)
Aware of provider's advice and new medication.  Appointment scheduled for 10 day recheck.

## 2019-06-01 NOTE — Telephone Encounter (Signed)
Wife came to office and gave lab urine specimen from patient. He wants to check for infection. His recent INR ,done by home health,  was 2.9 on November 23rd  and 30th, Does he need to adjust the dose?  When should he schedule a follow up appointment with PCP?

## 2019-06-01 NOTE — Telephone Encounter (Signed)
Urine tests were ordered under Dr. Marjean Donna name.  Could you review and advise, please.

## 2019-06-03 LAB — URINE CULTURE

## 2019-06-04 ENCOUNTER — Emergency Department (HOSPITAL_COMMUNITY): Payer: Medicare Other

## 2019-06-04 ENCOUNTER — Other Ambulatory Visit: Payer: Self-pay

## 2019-06-04 ENCOUNTER — Encounter (HOSPITAL_COMMUNITY): Payer: Self-pay

## 2019-06-04 ENCOUNTER — Inpatient Hospital Stay (HOSPITAL_COMMUNITY)
Admission: EM | Admit: 2019-06-04 | Discharge: 2019-06-08 | DRG: 690 | Disposition: A | Discharge status: Home Health | Payer: Medicare Other | Attending: Internal Medicine | Admitting: Internal Medicine

## 2019-06-04 DIAGNOSIS — E662 Morbid (severe) obesity with alveolar hypoventilation: Secondary | ICD-10-CM | POA: Diagnosis not present

## 2019-06-04 DIAGNOSIS — Z23 Encounter for immunization: Secondary | ICD-10-CM | POA: Diagnosis not present

## 2019-06-04 DIAGNOSIS — R319 Hematuria, unspecified: Secondary | ICD-10-CM | POA: Diagnosis not present

## 2019-06-04 DIAGNOSIS — F1120 Opioid dependence, uncomplicated: Secondary | ICD-10-CM | POA: Diagnosis present

## 2019-06-04 DIAGNOSIS — Z794 Long term (current) use of insulin: Secondary | ICD-10-CM

## 2019-06-04 DIAGNOSIS — N185 Chronic kidney disease, stage 5: Secondary | ICD-10-CM | POA: Diagnosis not present

## 2019-06-04 DIAGNOSIS — Q614 Renal dysplasia: Secondary | ICD-10-CM

## 2019-06-04 DIAGNOSIS — G894 Chronic pain syndrome: Secondary | ICD-10-CM | POA: Diagnosis not present

## 2019-06-04 DIAGNOSIS — E1122 Type 2 diabetes mellitus with diabetic chronic kidney disease: Secondary | ICD-10-CM | POA: Diagnosis not present

## 2019-06-04 DIAGNOSIS — Z8261 Family history of arthritis: Secondary | ICD-10-CM

## 2019-06-04 DIAGNOSIS — J9611 Chronic respiratory failure with hypoxia: Secondary | ICD-10-CM | POA: Diagnosis not present

## 2019-06-04 DIAGNOSIS — Z7901 Long term (current) use of anticoagulants: Secondary | ICD-10-CM

## 2019-06-04 DIAGNOSIS — N390 Urinary tract infection, site not specified: Secondary | ICD-10-CM | POA: Diagnosis present

## 2019-06-04 DIAGNOSIS — N10 Acute tubulo-interstitial nephritis: Secondary | ICD-10-CM | POA: Diagnosis not present

## 2019-06-04 DIAGNOSIS — R569 Unspecified convulsions: Secondary | ICD-10-CM | POA: Diagnosis not present

## 2019-06-04 DIAGNOSIS — R791 Abnormal coagulation profile: Secondary | ICD-10-CM | POA: Diagnosis not present

## 2019-06-04 DIAGNOSIS — Z79899 Other long term (current) drug therapy: Secondary | ICD-10-CM

## 2019-06-04 DIAGNOSIS — N179 Acute kidney failure, unspecified: Secondary | ICD-10-CM | POA: Diagnosis not present

## 2019-06-04 DIAGNOSIS — E8770 Fluid overload, unspecified: Secondary | ICD-10-CM | POA: Diagnosis present

## 2019-06-04 DIAGNOSIS — H9190 Unspecified hearing loss, unspecified ear: Secondary | ICD-10-CM | POA: Diagnosis present

## 2019-06-04 DIAGNOSIS — N12 Tubulo-interstitial nephritis, not specified as acute or chronic: Secondary | ICD-10-CM | POA: Diagnosis present

## 2019-06-04 DIAGNOSIS — N2889 Other specified disorders of kidney and ureter: Secondary | ICD-10-CM | POA: Diagnosis not present

## 2019-06-04 DIAGNOSIS — R040 Epistaxis: Secondary | ICD-10-CM | POA: Diagnosis present

## 2019-06-04 DIAGNOSIS — I10 Essential (primary) hypertension: Secondary | ICD-10-CM | POA: Diagnosis not present

## 2019-06-04 DIAGNOSIS — K7460 Unspecified cirrhosis of liver: Secondary | ICD-10-CM | POA: Diagnosis not present

## 2019-06-04 DIAGNOSIS — E1140 Type 2 diabetes mellitus with diabetic neuropathy, unspecified: Secondary | ICD-10-CM | POA: Diagnosis not present

## 2019-06-04 DIAGNOSIS — E1165 Type 2 diabetes mellitus with hyperglycemia: Secondary | ICD-10-CM | POA: Diagnosis present

## 2019-06-04 DIAGNOSIS — E785 Hyperlipidemia, unspecified: Secondary | ICD-10-CM | POA: Diagnosis present

## 2019-06-04 DIAGNOSIS — Z91013 Allergy to seafood: Secondary | ICD-10-CM

## 2019-06-04 DIAGNOSIS — N3090 Cystitis, unspecified without hematuria: Secondary | ICD-10-CM

## 2019-06-04 DIAGNOSIS — G40909 Epilepsy, unspecified, not intractable, without status epilepticus: Secondary | ICD-10-CM | POA: Diagnosis present

## 2019-06-04 DIAGNOSIS — Z9049 Acquired absence of other specified parts of digestive tract: Secondary | ICD-10-CM

## 2019-06-04 DIAGNOSIS — Z6841 Body Mass Index (BMI) 40.0 and over, adult: Secondary | ICD-10-CM

## 2019-06-04 DIAGNOSIS — R300 Dysuria: Secondary | ICD-10-CM | POA: Diagnosis not present

## 2019-06-04 DIAGNOSIS — K219 Gastro-esophageal reflux disease without esophagitis: Secondary | ICD-10-CM | POA: Diagnosis present

## 2019-06-04 DIAGNOSIS — N400 Enlarged prostate without lower urinary tract symptoms: Secondary | ICD-10-CM | POA: Diagnosis present

## 2019-06-04 DIAGNOSIS — Z880 Allergy status to penicillin: Secondary | ICD-10-CM

## 2019-06-04 DIAGNOSIS — Z86711 Personal history of pulmonary embolism: Secondary | ICD-10-CM

## 2019-06-04 DIAGNOSIS — M1990 Unspecified osteoarthritis, unspecified site: Secondary | ICD-10-CM | POA: Diagnosis not present

## 2019-06-04 DIAGNOSIS — D61818 Other pancytopenia: Secondary | ICD-10-CM | POA: Diagnosis not present

## 2019-06-04 DIAGNOSIS — Z91030 Bee allergy status: Secondary | ICD-10-CM

## 2019-06-04 DIAGNOSIS — Z743 Need for continuous supervision: Secondary | ICD-10-CM | POA: Diagnosis not present

## 2019-06-04 DIAGNOSIS — G43909 Migraine, unspecified, not intractable, without status migrainosus: Secondary | ICD-10-CM | POA: Diagnosis present

## 2019-06-04 DIAGNOSIS — F319 Bipolar disorder, unspecified: Secondary | ICD-10-CM | POA: Diagnosis present

## 2019-06-04 DIAGNOSIS — Z91040 Latex allergy status: Secondary | ICD-10-CM

## 2019-06-04 DIAGNOSIS — Z91041 Radiographic dye allergy status: Secondary | ICD-10-CM

## 2019-06-04 DIAGNOSIS — R52 Pain, unspecified: Secondary | ICD-10-CM | POA: Diagnosis not present

## 2019-06-04 DIAGNOSIS — F419 Anxiety disorder, unspecified: Secondary | ICD-10-CM | POA: Diagnosis present

## 2019-06-04 DIAGNOSIS — N200 Calculus of kidney: Secondary | ICD-10-CM | POA: Diagnosis present

## 2019-06-04 DIAGNOSIS — Z8619 Personal history of other infectious and parasitic diseases: Secondary | ICD-10-CM

## 2019-06-04 DIAGNOSIS — I959 Hypotension, unspecified: Secondary | ICD-10-CM | POA: Diagnosis not present

## 2019-06-04 DIAGNOSIS — Z87440 Personal history of urinary (tract) infections: Secondary | ICD-10-CM

## 2019-06-04 DIAGNOSIS — Z833 Family history of diabetes mellitus: Secondary | ICD-10-CM

## 2019-06-04 DIAGNOSIS — N3001 Acute cystitis with hematuria: Secondary | ICD-10-CM | POA: Diagnosis not present

## 2019-06-04 DIAGNOSIS — Z86718 Personal history of other venous thrombosis and embolism: Secondary | ICD-10-CM

## 2019-06-04 LAB — URINALYSIS, ROUTINE W REFLEX MICROSCOPIC: Protein, ur: NEGATIVE mg/dL

## 2019-06-04 LAB — URINALYSIS, MICROSCOPIC (REFLEX)
Bacteria, UA: NONE SEEN
RBC / HPF: >50 RBC/hpf (ref 0–5)
Squamous Epithelial / HPF: NONE SEEN (ref 0–5)

## 2019-06-04 LAB — CBC WITH DIFFERENTIAL/PLATELET
Abs Immature Granulocytes: 0.01 10*3/uL (ref 0.00–0.07)
Basophils Absolute: 0 10*3/uL (ref 0.0–0.1)
Basophils Relative: 1 %
Eosinophils Absolute: 0.3 10*3/uL (ref 0.0–0.5)
Eosinophils Relative: 8 %
HCT: 31.2 % — ABNORMAL LOW (ref 39.0–52.0)
Hemoglobin: 9.4 g/dL — ABNORMAL LOW (ref 13.0–17.0)
Immature Granulocytes: 0 %
Lymphocytes Relative: 19 %
Lymphs Abs: 0.7 10*3/uL (ref 0.7–4.0)
MCH: 29.8 pg (ref 26.0–34.0)
MCHC: 30.1 g/dL (ref 30.0–36.0)
MCV: 99 fL (ref 80.0–100.0)
Monocytes Absolute: 0.3 10*3/uL (ref 0.1–1.0)
Monocytes Relative: 9 %
Neutro Abs: 2.2 10*3/uL (ref 1.7–7.7)
Neutrophils Relative %: 63 %
Platelets: 72 10*3/uL — ABNORMAL LOW (ref 150–400)
RBC: 3.15 MIL/uL — ABNORMAL LOW (ref 4.22–5.81)
RDW: 13.8 % (ref 11.5–15.5)
WBC: 3.5 10*3/uL — ABNORMAL LOW (ref 4.0–10.5)
nRBC: 0 % (ref 0.0–0.2)

## 2019-06-04 LAB — COMPREHENSIVE METABOLIC PANEL
ALT: 10 U/L (ref 0–44)
AST: 22 U/L (ref 15–41)
Albumin: 2.7 g/dL — ABNORMAL LOW (ref 3.5–5.0)
Alkaline Phosphatase: 68 U/L (ref 38–126)
Anion gap: 12 (ref 5–15)
BUN: 41 mg/dL — ABNORMAL HIGH (ref 6–20)
CO2: 33 mmol/L — ABNORMAL HIGH (ref 22–32)
Calcium: 8.5 mg/dL — ABNORMAL LOW (ref 8.9–10.3)
Chloride: 95 mmol/L — ABNORMAL LOW (ref 98–111)
Creatinine, Ser: 4.35 mg/dL — ABNORMAL HIGH (ref 0.61–1.24)
GFR calc Af Amer: 17 mL/min — ABNORMAL LOW (ref >60)
GFR calc non Af Amer: 15 mL/min — ABNORMAL LOW (ref >60)
Glucose, Bld: 161 mg/dL — ABNORMAL HIGH (ref 70–99)
Potassium: 3.9 mmol/L (ref 3.5–5.1)
Sodium: 140 mmol/L (ref 135–145)
Total Bilirubin: 0.7 mg/dL (ref 0.3–1.2)
Total Protein: 7.3 g/dL (ref 6.5–8.1)

## 2019-06-04 LAB — PROTIME-INR
INR: 3.9 — ABNORMAL HIGH (ref 0.8–1.2)
Prothrombin Time: 38.2 seconds — ABNORMAL HIGH (ref 11.4–15.2)

## 2019-06-04 LAB — GLUCOSE, CAPILLARY: Glucose-Capillary: 227 mg/dL — ABNORMAL HIGH (ref 70–99)

## 2019-06-04 MED ORDER — ONDANSETRON HCL 4 MG PO TABS: 4.0000 mg | ORAL_TABLET | Freq: Four times a day (QID) | ORAL | Status: DC | PRN

## 2019-06-04 MED ORDER — ACETAMINOPHEN 650 MG RE SUPP: 650.0000 mg | Freq: Four times a day (QID) | RECTAL | Status: DC | PRN

## 2019-06-04 MED ORDER — SODIUM CHLORIDE 0.9 % IV SOLN
1.0000 g | Freq: Once | INTRAVENOUS | Status: AC
  Administered 2019-06-04: 1 g via INTRAVENOUS
  Filled 2019-06-04: qty 10

## 2019-06-04 MED ORDER — CALCITRIOL 0.25 MCG PO CAPS
0.2500 ug | ORAL_CAPSULE | Freq: Every day | ORAL | Status: DC
  Administered 2019-06-05 – 2019-06-08 (×4): 0.25 ug via ORAL
  Filled 2019-06-04 (×4): qty 1

## 2019-06-04 MED ORDER — INSULIN ASPART 100 UNIT/ML ~~LOC~~ SOLN
0.0000 [IU] | Freq: Every day | SUBCUTANEOUS | Status: DC
  Administered 2019-06-04 – 2019-06-06 (×3): 2 [IU] via SUBCUTANEOUS

## 2019-06-04 MED ORDER — TAMSULOSIN HCL 0.4 MG PO CAPS
0.4000 mg | ORAL_CAPSULE | Freq: Every day | ORAL | Status: DC
  Administered 2019-06-05 – 2019-06-08 (×4): 0.4 mg via ORAL
  Filled 2019-06-04 (×4): qty 1

## 2019-06-04 MED ORDER — INSULIN ASPART 100 UNIT/ML ~~LOC~~ SOLN
0.0000 [IU] | Freq: Three times a day (TID) | SUBCUTANEOUS | Status: DC
  Administered 2019-06-05: 4 [IU] via SUBCUTANEOUS
  Administered 2019-06-05 – 2019-06-06 (×3): 7 [IU] via SUBCUTANEOUS
  Administered 2019-06-06: 4 [IU] via SUBCUTANEOUS
  Administered 2019-06-06: 7 [IU] via SUBCUTANEOUS
  Administered 2019-06-07 (×2): 4 [IU] via SUBCUTANEOUS
  Administered 2019-06-07: 7 [IU] via SUBCUTANEOUS
  Administered 2019-06-08 (×2): 4 [IU] via SUBCUTANEOUS

## 2019-06-04 MED ORDER — ALBUTEROL SULFATE (2.5 MG/3ML) 0.083% IN NEBU: 3.0000 mL | INHALATION_SOLUTION | RESPIRATORY_TRACT | Status: DC | PRN

## 2019-06-04 MED ORDER — SIMVASTATIN 20 MG PO TABS
20.0000 mg | ORAL_TABLET | Freq: Every day | ORAL | Status: DC
  Administered 2019-06-04 – 2019-06-07 (×4): 20 mg via ORAL
  Filled 2019-06-04 (×4): qty 1

## 2019-06-04 MED ORDER — FUROSEMIDE 80 MG PO TABS
80.0000 mg | ORAL_TABLET | Freq: Two times a day (BID) | ORAL | Status: DC
  Administered 2019-06-04 – 2019-06-06 (×4): 80 mg via ORAL
  Filled 2019-06-04 (×4): qty 1

## 2019-06-04 MED ORDER — POTASSIUM CHLORIDE CRYS ER 20 MEQ PO TBCR
30.0000 meq | EXTENDED_RELEASE_TABLET | Freq: Every day | ORAL | Status: DC
  Administered 2019-06-05 – 2019-06-06 (×2): 30 meq via ORAL
  Filled 2019-06-04 (×2): qty 1

## 2019-06-04 MED ORDER — INSULIN GLARGINE 100 UNIT/ML ~~LOC~~ SOLN
45.0000 [IU] | Freq: Two times a day (BID) | SUBCUTANEOUS | Status: DC
  Administered 2019-06-04 – 2019-06-08 (×8): 45 [IU] via SUBCUTANEOUS
  Filled 2019-06-04 (×10): qty 0.45

## 2019-06-04 MED ORDER — PANTOPRAZOLE SODIUM 40 MG PO TBEC
40.0000 mg | DELAYED_RELEASE_TABLET | Freq: Every day | ORAL | Status: DC
  Administered 2019-06-05 – 2019-06-08 (×4): 40 mg via ORAL
  Filled 2019-06-04 (×4): qty 1

## 2019-06-04 MED ORDER — LAMOTRIGINE 100 MG PO TABS
200.0000 mg | ORAL_TABLET | Freq: Two times a day (BID) | ORAL | Status: DC
  Administered 2019-06-04 – 2019-06-08 (×8): 200 mg via ORAL
  Filled 2019-06-04 (×8): qty 2

## 2019-06-04 MED ORDER — INFLUENZA VAC SPLIT QUAD 0.5 ML IM SUSY
0.5000 mL | PREFILLED_SYRINGE | INTRAMUSCULAR | Status: AC
  Administered 2019-06-05: 0.5 mL via INTRAMUSCULAR
  Filled 2019-06-04: qty 0.5

## 2019-06-04 MED ORDER — PREGABALIN 50 MG PO CAPS
50.0000 mg | ORAL_CAPSULE | Freq: Three times a day (TID) | ORAL | Status: DC
  Administered 2019-06-04 – 2019-06-07 (×10): 50 mg via ORAL
  Filled 2019-06-04 (×10): qty 1

## 2019-06-04 MED ORDER — POLYETHYLENE GLYCOL 3350 17 G PO PACK
17.0000 g | PACK | Freq: Every day | ORAL | Status: DC | PRN
  Filled 2019-06-04: qty 1

## 2019-06-04 MED ORDER — CEPHALEXIN 500 MG PO CAPS: 500.0000 mg | ORAL_CAPSULE | Freq: Four times a day (QID) | ORAL | 0 refills | Status: DC

## 2019-06-04 MED ORDER — CANAGLIFLOZIN 100 MG PO TABS: 100.0000 mg | ORAL_TABLET | Freq: Every day | ORAL | Status: DC

## 2019-06-04 MED ORDER — CYCLOBENZAPRINE HCL 10 MG PO TABS: 10.0000 mg | ORAL_TABLET | Freq: Every evening | ORAL | Status: DC | PRN

## 2019-06-04 MED ORDER — CLOMIPHENE CITRATE 50 MG PO TABS
25.0000 mg | ORAL_TABLET | ORAL | Status: DC
  Administered 2019-06-06 – 2019-06-08 (×2): 25 mg via ORAL
  Filled 2019-06-04 (×2): qty 1

## 2019-06-04 MED ORDER — DULOXETINE HCL 60 MG PO CPEP
60.0000 mg | ORAL_CAPSULE | Freq: Every day | ORAL | Status: DC
  Administered 2019-06-05 – 2019-06-08 (×4): 60 mg via ORAL
  Filled 2019-06-04 (×4): qty 1

## 2019-06-04 MED ORDER — INSULIN GLARGINE 100 UNIT/ML ~~LOC~~ SOLN
50.0000 [IU] | Freq: Two times a day (BID) | SUBCUTANEOUS | Status: DC
  Filled 2019-06-04 (×2): qty 0.5

## 2019-06-04 MED ORDER — ONDANSETRON HCL 4 MG/2ML IJ SOLN: 4.0000 mg | Freq: Four times a day (QID) | INTRAMUSCULAR | Status: DC | PRN

## 2019-06-04 MED ORDER — ACETAMINOPHEN 325 MG PO TABS: 650.0000 mg | ORAL_TABLET | Freq: Four times a day (QID) | ORAL | Status: DC | PRN

## 2019-06-04 MED ORDER — FENTANYL 50 MCG/HR TD PT72
1.0000 | MEDICATED_PATCH | TRANSDERMAL | Status: DC
  Administered 2019-06-05 – 2019-06-08 (×2): 1 via TRANSDERMAL
  Filled 2019-06-04: qty 1

## 2019-06-04 MED ORDER — LEVETIRACETAM 500 MG PO TABS
1000.0000 mg | ORAL_TABLET | Freq: Two times a day (BID) | ORAL | Status: DC
  Administered 2019-06-04 – 2019-06-08 (×8): 1000 mg via ORAL
  Filled 2019-06-04 (×8): qty 2

## 2019-06-04 MED ORDER — BUSPIRONE HCL 5 MG PO TABS
15.0000 mg | ORAL_TABLET | Freq: Two times a day (BID) | ORAL | Status: DC
  Administered 2019-06-04 – 2019-06-07 (×7): 15 mg via ORAL
  Administered 2019-06-08: 10 mg via ORAL
  Filled 2019-06-04 (×8): qty 3

## 2019-06-04 MED ORDER — LORATADINE 10 MG PO TABS
10.0000 mg | ORAL_TABLET | Freq: Every day | ORAL | Status: DC
  Administered 2019-06-05 – 2019-06-08 (×4): 10 mg via ORAL
  Filled 2019-06-04 (×4): qty 1

## 2019-06-04 MED ORDER — FLUTICASONE PROPIONATE 50 MCG/ACT NA SUSP: 1.0000 | Freq: Two times a day (BID) | NASAL | Status: DC | PRN

## 2019-06-04 MED ORDER — SODIUM CHLORIDE 0.9 % IV BOLUS
1000.0000 mL | Freq: Once | INTRAVENOUS | Status: AC
  Administered 2019-06-04: 1000 mL via INTRAVENOUS

## 2019-06-04 MED ORDER — HYDROCODONE-ACETAMINOPHEN 7.5-325 MG PO TABS
1.0000 | ORAL_TABLET | Freq: Four times a day (QID) | ORAL | Status: DC | PRN
  Administered 2019-06-07 – 2019-06-08 (×2): 1 via ORAL
  Filled 2019-06-04 (×2): qty 1

## 2019-06-04 MED ORDER — SODIUM CHLORIDE 0.9 % IV SOLN: 2.0000 g | INTRAVENOUS | Status: DC
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
## 2019-06-04 NOTE — H&P (Addendum)
History and Physical    [name]:[id number] DOB: [birth year] DOA: 06/04/2019  PCP: Sharion Balloon, FNP   Patient coming from: Home  I have personally briefly reviewed patient's old medical records in [place]  Chief Complaint: Pain with urination, left sided flank pain.  HPI: [name] is a 53 y.o. male with medical history significant for CKD stage V, chronic respiratory failure on 3 L O2, OSA/OHS, diabetes mellitus, pulmonary embolism DVT, bipolar disorder.  Patient presented to ED with complaints of pain with urination of about 1 week duration.  Evaluated by primary care provider 12/3 started on Augmentin, but despite this symptoms have persisted.  Spouse is present at bedside and also with the history. No fever no chills, no vomiting no loose stools.  No change in his respiratory status.  Also reports some right sided flank pain over the past few days, per triage notes it was a left flank pain.  2 recent hospitalization  11/10- 11/16-nonoliguric acute on chronic kidney injury stage V secondary to ischemic ATN related to hypotension from sepsis, also with acute on chronic hypoxic respiratory failure, secondary to pulmonary edema versus infiltrates as a sequelae of his recent COVID-19 infection and sepsis secondary to Enterococcus faecalis UTI.  History of penicillin allergy, patient was challenged while hospitalized and it was tolerated well.  Discharged home on amoxicillin.  But spouse reports a diffuse rash which she noticed when he got home.  10/11-10/27-acute on chronic respiratory failure secondary to pneumonia due to COVID-19 virus, did not require intubation.  Treated with remdesivir and steroids.  ED Course: O2 sats greater than 92% on nasal cannula 4 L, stable vitals.  Temperature 98.2.  WBC 3.5.  Creatinine 4.3, close to baseline.  INR 3.9.  UA with greater than 21- 50 WBC. CT renal stone, Increased mild right renal pelvicaliectasis and hazy perinephric  opacity. No obstructing calculi identified. These findings are nonspecific, but pyelonephritis cannot be excluded.  Started on IV ceftriaxone in the ED.  Review of Systems: As per HPI all other systems reviewed and negative.  Past Medical History:  Diagnosis Date   Anemia    Anxiety    Arthritis    Bipolar 1 disorder ([place])    Bronchitis    hx of   Bruises easily    Chronic kidney disease    decreased left kidney fx   Chronic pain syndrome 05/11/2012   Chronic respiratory failure with hypoxia (HCC)    And with hypercapnia   Diabetes mellitus without complication (HCC)    Diabetic neuropathy (HCC)    DVT (deep venous thrombosis) (HCC)    LLE DVT ~ '12   Dyspnea    with ambulation   GERD (gastroesophageal reflux disease)    History of 2019 novel coronavirus disease (COVID-19)    HOH (hard of hearing) 2015   has hearing aids   Mental disorder    Migraine    Neuromuscular disorder ([place])    Obesity hypoventilation syndrome (HCC)    Obstructive sleep apnea    CPAP   PE (pulmonary embolism)    bilateral PE ~ '11   Seizures ([place])    Thrombocytopenia ([place]) 05/11/2012    Past Surgical History:  Procedure Laterality Date   arm surgery     left arm surgery from [facility]  08/02/2008   clean   CHOLECYSTECTOMY     DENTAL SURGERY     upper and lower teeth extracted   EYE  SURGERY     catracts / replacement lens   IR EPIDUROGRAPHY  06/07/2018   IR FL GUIDED LOC OF NEEDLE/CATH TIP FOR SPINAL INJECTION RT  04/11/2018   MULTIPLE EXTRACTIONS WITH ALVEOLOPLASTY  05/09/2012   Procedure: MULTIPLE EXTRACION WITH ALVEOLOPLASTY;  Surgeon: Gae Bon, DDS;  Location: [facility];  Service: Oral Surgery;  Laterality: Bilateral;  Extracted teeth numbers eighteen, nineteen, twenty, twenty-one, twenty- two, twenty-three, twenty-four, twenty-five, twenty-six, twenty-seven, twenty-eight, twenty-nine, thirty, thirty- one, thirty-two and alveoplasty  lower right and left quadrants.    PATELLA FRACTURE SURGERY     left knee   TONSILLECTOMY       reports that he has never smoked. He has never used smokeless tobacco. He reports that he does not drink alcohol or use drugs.  Allergies  Allergen Reactions   Bee Venom Anaphylaxis, Shortness Of Breath and Swelling   Other Shortness Of Breath    Itching, rash with IVP DYE, iodine, shellfish LATEX   Shellfish Allergy Nausea And Vomiting and Other (See Comments)    Feels like insides are twisting   Iodinated Diagnostic Agents     Other reaction(s): RASH   Iohexol      Code: RASH, Desc: PT WAS ON PREDNISONE FOR GOUT TX. @ TIME OF SCAN AND RECEIVED 50 MG OF BENADRYL IV-ARS 10/08/07    Iodine Rash   Latex Rash    Family History  Problem Relation Age of Onset   Liver cancer Mother    Cancer Mother        breast   Arthritis Father    Deep vein thrombosis Father        on warfarin   Diabetes Paternal Grandfather     Prior to Admission medications   Medication Sig Start Date End Date Taking? Authorizing Provider  amoxicillin-clavulanate (AUGMENTIN) 875-125 MG tablet Take 1 tablet by mouth 2 (two) times daily. 06/01/19  Yes Hawks, Christy A, FNP  busPIRone (BUSPAR) 15 MG tablet TAKE 1 TABLET (15 MG TOTAL) BY MOUTH 2 (TWO) TIMES DAILY. Patient taking differently: Take 15 mg by mouth 2 (two) times daily. TAKE 1 TABLET (15 MG TOTAL) BY MOUTH 2 (TWO) TIMES DAILY. 05/04/19  Yes Hawks, Christy A, FNP  calcitRIOL (ROCALTROL) 0.25 MCG capsule Take 0.25 mcg by mouth daily. 03/15/19  Yes [provider]  cetirizine (ZYRTEC) 10 MG tablet Take 1 tablet (10 mg total) by mouth daily. 10/24/18  Yes Hawks, Christy A, FNP  clomiPHENE (CLOMID) 50 MG tablet Take 0.5 tablets (25 mg total) by mouth every other day. 10/31/18  Yes Renato Shin, MD  cyclobenzaprine (FLEXERIL) 10 MG tablet TAKE 1/2 TO 1 TABLET BY MOUTH AS NEEDED AT BEDTIME FOR MUSCLE CRAMPS / SPASMS Patient taking differently:  Take 10 mg by mouth daily as needed. TAKE 1/2 TO 1 TABLET BY MOUTH AS NEEDED AT BEDTIME FOR MUSCLE CRAMPS / SPASMS 12/21/17  Yes Hawks, Christy A, FNP  docusate sodium (COLACE) 100 MG capsule Take 1 capsule (100 mg total) by mouth 2 (two) times daily. 05/15/19  Yes Mikhail, Clinical biochemist, DO  DULoxetine (CYMBALTA) 60 MG capsule TAKE 1 CAPSULE BY MOUTH EVERY DAY Patient taking differently: Take 60 mg by mouth daily.  05/04/19  Yes Hawks, Christy A, FNP  EMGALITY 120 MG/ML SOAJ Inject 120 mg as directed every 30 (thirty) days.  12/19/18  Yes [provider]  EPIPEN 2-PAK 0.3 MG/0.3ML SOAJ injection INJECT 0.3 MLS (0.3 MG TOTAL) INTO THE MUSCLE ONCE. AS NEEDED FOR ANAPHYLACTIC REACTION 04/26/18  Yes Hawks, Christy A, FNP  esomeprazole (NEXIUM) 40 MG capsule TAKE 1 CAPSULE (40 MG TOTAL) BY MOUTH DAILY AT 12 NOON. 05/04/19  Yes Hawks, Christy A, FNP  fentaNYL (DURAGESIC) 75 MCG/HR Place 1 patch onto the skin every 3 (three) days. 04/25/19  Yes Elgergawy, Silver Huguenin, MD  fluticasone (FLONASE) 50 MCG/ACT nasal spray PLACE 1 SPRAY INTO BOTH NOSTRILS 2 (TWO) TIMES DAILY AS NEEDED FOR ALLERGIES. 07/22/18  Yes Hawks, Christy A, FNP  furosemide (LASIX) 80 MG tablet Take 1 tablet (80 mg total) by mouth 2 (two) times daily. 06/01/19  Yes Hawks, Christy A, FNP  HYDROcodone-acetaminophen (NORCO) 7.5-325 MG tablet Take 1 tablet by mouth every 6 (six) hours as needed for moderate pain. 02/16/19  Yes Hawks, Christy A, FNP  Insulin Glargine, 1 Unit Dial, (TOUJEO SOLOSTAR) 300 UNIT/ML SOPN Inject 60 Units into the skin 2 (two) times daily. Patient taking differently: Inject 200 Units into the skin daily.  04/25/19  Yes Elgergawy, Silver Huguenin, MD  insulin lispro (HUMALOG) 100 UNIT/ML KwikPen Inject 0.25 mLs (25 Units total) into the skin 3 (three) times daily. Patient taking differently: Inject 50 Units into the skin 3 (three) times daily.  04/25/19  Yes Elgergawy, Silver Huguenin, MD  JARDIANCE 25 MG TABS tablet Take 25 mg by mouth  daily.  12/20/18  Yes [provider]  KLOR-CON M10 10 MEQ tablet TAKE 3 TABLETS (30 MEQ TOTAL) BY MOUTH DAILY. Patient taking differently: Take 30 mEq by mouth daily.  04/12/19  Yes Hawks, Christy A, FNP  lamoTRIgine (LAMICTAL) 200 MG tablet TAKE 1 TABLET BY MOUTH TWICE A DAY Patient taking differently: Take 200 mg by mouth 2 (two) times daily.  05/04/19  Yes Hawks, Christy A, FNP  levETIRAcetam (KEPPRA) 1000 MG tablet Take 1 tablet (1,000 mg total) by mouth 2 (two) times daily. 09/28/18  Yes [name], MD  NARCAN 4 MG/0.1ML LIQD nasal spray kit SMARTSIG:1 Spray(s) Both Nares As Needed 03/15/19  Yes [provider]  pregabalin (LYRICA) 50 MG capsule TAKE 1 CAPSULE BY MOUTH THREE TIMES A DAY Patient taking differently: Take 50 mg by mouth 3 (three) times daily. TAKE 1 CAPSULE BY MOUTH THREE TIMES A DAY 03/07/19  Yes Sharion Balloon, FNP  PROAIR HFA 108 (90 Base) MCG/ACT inhaler TAKE 2 PUFFS BY MOUTH EVERY 6 HOURS AS NEEDED FOR WHEEZE OR SHORTNESS OF BREATH 05/24/19  Yes Hawks, Alyse Low A, FNP  rizatriptan (MAXALT) 10 MG tablet May repeat in 2 hours if needed Patient taking differently: Take 10 mg by mouth as needed. May repeat in 2 hours if needed 09/28/18  Yes [name], MD  simvastatin (ZOCOR) 20 MG tablet Take 1 tablet (20 mg total) by mouth at bedtime. 07/25/18  Yes Hawks, Christy A, FNP  tamsulosin (FLOMAX) 0.4 MG CAPS capsule TAKE 1 CAPSULE BY MOUTH EVERY DAY Patient taking differently: Take 0.4 mg by mouth daily. TAKE 1 CAPSULE BY MOUTH EVERY DAY 04/12/19  Yes Hawks, Christy A, FNP  TRULICITY 1.5 YN/8.2NF SOPN Inject 1.5 mg as directed once a week.  03/01/19  Yes [provider]  vitamin C (VITAMIN C) 500 MG tablet Take 1 tablet (500 mg total) by mouth daily. 05/15/19  Yes Mikhail, O'Neill, DO  Vitamin D, Ergocalciferol, (DRISDOL) 1.25 MG (50000 UT) CAPS capsule Take 50,000 Units by mouth once a week. 05/03/19  Yes [provider]  warfarin (COUMADIN) 5  MG tablet Take 1 tablet (5 mg total) by mouth daily. Take 50m  daily 05/29/19  Yes Hawks, Christy A, FNP  zinc sulfate 220 (50 Zn) MG capsule Take 1 capsule (220 mg total) by mouth 2 (two) times daily. 05/15/19  Yes Mikhail, Velta Addison, DO  cephALEXin (KEFLEX) 500 MG capsule Take 1 capsule (500 mg total) by mouth 4 (four) times daily. 06/04/19   Milton Ferguson, MD  warfarin (COUMADIN) 3 MG tablet Take 1 tablet (3 mg total) by mouth one time only at 6 PM. Patient not taking: Reported on 06/04/2019 05/15/19   Cristal Ford, DO  potassium chloride (K-DUR) 10 MEQ tablet Take 3 tablets (30 mEq total) by mouth daily. 02/22/13 06/05/13  Chipper Herb, MD    Physical Exam: Vitals:   06/04/19 1400 06/04/19 1410 06/04/19 1415 06/04/19 1430  BP: 112/81 130/74  136/68  Pulse: 81 83 82 81  Resp: 17 18 (!) 21 (!) 23  Temp:  98.2 F (36.8 C)    TempSrc:  Oral    SpO2: 95% 98% 95% 93%  Weight:      Height:        Constitutional: NAD, calm, comfortable Vitals:   06/04/19 1400 06/04/19 1410 06/04/19 1415 06/04/19 1430  BP: 112/81 130/74  136/68  Pulse: 81 83 82 81  Resp: 17 18 (!) 21 (!) 23  Temp:  98.2 F (36.8 C)    TempSrc:  Oral    SpO2: 95% 98% 95% 93%  Weight:      Height:       Eyes: PERRL, lids and conjunctivae normal ENMT: Mucous membranes are moist. Posterior pharynx clear of any exudate or lesions.Normal dentition.  Neck: normal, supple, no masses, no thyromegaly Respiratory: clear to auscultation bilaterally, no wheezing, no crackles. Normal respiratory effort. No accessory muscle use.  Cardiovascular: Regular rate and rhythm, no murmurs / rubs / gallops. No extremity edema. 2+ pedal pulses. No carotid bruits.  Abdomen: no tenderness, no masses palpated. No hepatosplenomegaly. Bowel sounds positive.  Musculoskeletal: no clubbing / cyanosis. No joint deformity upper and lower extremities. Good ROM, no contractures. Normal muscle tone.  Skin: no rashes, lesions, ulcers. No  induration Neurologic: CN 2-12 grossly intact. Strength 5/5 in all 4.  Psychiatric: Normal judgment and insight. Alert and oriented x 3. Normal mood.   Labs on Admission: I have personally reviewed following labs and imaging studies  CBC: Recent Labs  Lab 06/04/19 0919  WBC 3.5*  NEUTROABS 2.2  HGB 9.4*  HCT 31.2*  MCV 99.0  PLT 72*   Basic Metabolic Panel: Recent Labs  Lab 06/04/19 0919  NA 140  K 3.9  CL 95*  CO2 33*  GLUCOSE 161*  BUN 41*  CREATININE 4.35*  CALCIUM 8.5*   Liver Function Tests: Recent Labs  Lab 06/04/19 0919  AST 22  ALT 10  ALKPHOS 68  BILITOT 0.7  PROT 7.3  ALBUMIN 2.7*   Coagulation Profile: Recent Labs  Lab 06/04/19 0919  INR 3.9*   Urine analysis:    Component Value Date/Time   COLORURINE RED (A) 06/04/2019 1208   APPEARANCEUR TURBID (A) 06/04/2019 1208   APPEARANCEUR Cloudy (A) 06/01/2019 0843   LABSPEC  06/04/2019 1208    TEST NOT REPORTED DUE TO COLOR INTERFERENCE OF URINE PIGMENT   PHURINE  06/04/2019 1208    TEST NOT REPORTED DUE TO COLOR INTERFERENCE OF URINE PIGMENT   GLUCOSEU (A) 06/04/2019 1208    TEST NOT REPORTED DUE TO COLOR INTERFERENCE OF URINE PIGMENT   HGBUR (A) 06/04/2019 1208    TEST NOT  REPORTED DUE TO COLOR INTERFERENCE OF URINE PIGMENT   BILIRUBINUR (A) 06/04/2019 1208    TEST NOT REPORTED DUE TO COLOR INTERFERENCE OF URINE PIGMENT   BILIRUBINUR Negative 06/01/2019 0843   KETONESUR (A) 06/04/2019 1208    TEST NOT REPORTED DUE TO COLOR INTERFERENCE OF URINE PIGMENT   PROTEINUR NEGATIVE 06/04/2019 1208   UROBILINOGEN negative 02/22/2015 1427   UROBILINOGEN 0.2 10/17/2010 0951   NITRITE (A) 06/04/2019 1208    TEST NOT REPORTED DUE TO COLOR INTERFERENCE OF URINE PIGMENT   LEUKOCYTESUR (A) 06/04/2019 1208    TEST NOT REPORTED DUE TO COLOR INTERFERENCE OF URINE PIGMENT    Radiological Exams on Admission: Ct Renal Stone Study  Result Date: 06/04/2019 CLINICAL DATA:  Flank pain. Recurrent urinary tract  infections. EXAM: CT ABDOMEN AND PELVIS WITHOUT CONTRAST TECHNIQUE: Multidetector CT imaging of the abdomen and pelvis was performed following the standard protocol without IV contrast. COMPARISON:  12/28/2018 FINDINGS: Lower chest: Increased patchy areas airspace disease are seen in both lower lungs, right side greater than left. No evidence of pleural effusion. Hepatobiliary: No mass visualized on this unenhanced exam. Mild capsular nodularity and left and caudate lobe hypertrophy again seen, highly suspicious for cirrhosis. Prior cholecystectomy. No evidence of biliary obstruction. Pancreas: No mass or inflammatory process visualized on this unenhanced exam. Spleen: Moderate splenomegaly measuring 18-19 cm in length is stable and consistent with portal venous hypertension. Adrenals/Urinary tract: Chronic atrophy of the left kidney with several small peripherally calcified cyst is unchanged in appearance. Compensatory hypertrophy of the right kidney is again seen. Mild increase in hazy opacity is seen in the right perinephric region. Mild right pelvicaliectasis is seen containing high attenuation fluid suspicious for hemorrhage. No evidence of renal or ureteral calculi. Unremarkable unopacified urinary bladder. Stomach/Bowel: No evidence of obstruction, inflammatory process, or abnormal fluid collections. Vascular/Lymphatic: No pathologically enlarged lymph nodes identified. No evidence of abdominal aortic aneurysm. Aortic atherosclerosis. Reproductive:  No mass or other significant abnormality. Other:  None. Musculoskeletal:  No suspicious bone lesions identified. IMPRESSION: 1. Increased mild right renal pelvicaliectasis and hazy perinephric opacity. No obstructing calculi identified. These findings are nonspecific, but pyelonephritis cannot be excluded. 2. Stable chronic left renal atrophy. 3. Stable findings of hepatic cirrhosis and portal venous hypertension. No evidence of ascites. 4. Increased patchy areas  of airspace disease in both lower lungs, right side greater than left, suspicious for atypical infectious or inflammatory process. Aortic Atherosclerosis (ICD10-I70.0). Electronically Signed   By: Marlaine Hind M.D.   On: 06/04/2019 10:13    EKG: None.   Assessment/Plan Active Problems:   UTI (urinary tract infection)   Probable pyelonephritis-UA with 21-50 WBCs, recent hospitalization for Enterococcus faecalis UTI + sepsis.  Reports flank pain, per renal stone study, pyelonephritis cannot be excluded. History of penicillin allergy, recently challenged with penicillin and tolerated.  WBC 4.  Rules out for sepsis. -Continue IV ceftriaxone started in ED -Follow-up urine cultures  History of pulmonary embolism, DVT-INR 3.2.  On anticoagulation with warfarin.  Platelets today 72, this appears to be a chronic issue, with baseline 90 - 100.  Likely recent viral illness contributing. -PT/INR in a.m. -CBC -Will hold warfarin for now, pls resume in a.m. pending stable platelets, hemoglobin.  CKD 4 - 5-creatinine today stable at 4.35, recent discharge creatinine 2 weeks ago 4.1, when admitted for AKI on CKD.  Follows with Dr. Moshe Cipro, plans to obtain dialysis access-graft, after resolution of acute medical conditions. -Resume home Lasix 80 every 12 hourly -Resume  home K-Dur supplements  Seizure history-  -Resume home Keppra, Lamictal, Lyrica  Uncontrolled diabetes mellitus-random glucose 161. 04/10/19 Hgba1c 7.9. -Resume Lantus at reduced dose 45 u BID,  -Hold home premeal insulin, monthly emgality, weekly Trulicity, Jardiance. - SSI  Morbid obesity BMI 54, OSA, OHS, chronic respiratory failure on chronic L L O2-complicates overall care.  He is not on CPAP at night. -Supplemental O2  Depression bipolar disorder -Resume home buspirone, Cymbalta, Lyrica  Chronic pain syndrome -Resume home fentanyl patch, patient reports new dose per PCP is 50 mg/hr, hydrocodone, Lyrica  DVT  prophylaxis: Warfarin Code Status: Full code Family Communication: Spouse at bedside Disposition Plan: Per rounding team Consults called: None Admission status: Observation, telemetry.   Bethena Roys MD [facility]  06/04/2019, 7:24 PM

## 2019-06-04 NOTE — ED Notes (Signed)
Pt told staff he didn't feel like he should go home.  Notified Dr. Roderic Palau and he said he will speak to him.

## 2019-06-04 NOTE — Plan of Care (Signed)
  Problem: Education: Goal: Knowledge of General Education information will improve Description Including pain rating scale, medication(s)/side effects and non-pharmacologic comfort measures Outcome: Progressing   Problem: Health Behavior/Discharge Planning: Goal: Ability to manage health-related needs will improve Outcome: Progressing   

## 2019-06-04 NOTE — ED Notes (Signed)
Pt states he feels like he needs to be admitted and is very uncomfortable going home. Advised to speak to MD when he came back to reevaluation.

## 2019-06-04 NOTE — ED Triage Notes (Signed)
EMS reports pt recently discharged from [facility].  Reports had a foley cath while in the hospital.  Pt says has had multiple UTIs since the foley was removed.  Reports pain in left kidney area, hematuria, and burning with urination.

## 2019-06-05 DIAGNOSIS — K219 Gastro-esophageal reflux disease without esophagitis: Secondary | ICD-10-CM | POA: Diagnosis present

## 2019-06-05 DIAGNOSIS — Z7401 Bed confinement status: Secondary | ICD-10-CM | POA: Diagnosis not present

## 2019-06-05 DIAGNOSIS — R040 Epistaxis: Secondary | ICD-10-CM | POA: Diagnosis present

## 2019-06-05 DIAGNOSIS — I959 Hypotension, unspecified: Secondary | ICD-10-CM | POA: Diagnosis not present

## 2019-06-05 DIAGNOSIS — E8770 Fluid overload, unspecified: Secondary | ICD-10-CM | POA: Diagnosis present

## 2019-06-05 DIAGNOSIS — F1120 Opioid dependence, uncomplicated: Secondary | ICD-10-CM

## 2019-06-05 DIAGNOSIS — F319 Bipolar disorder, unspecified: Secondary | ICD-10-CM | POA: Diagnosis present

## 2019-06-05 DIAGNOSIS — J9601 Acute respiratory failure with hypoxia: Secondary | ICD-10-CM | POA: Diagnosis not present

## 2019-06-05 DIAGNOSIS — N10 Acute tubulo-interstitial nephritis: Secondary | ICD-10-CM

## 2019-06-05 DIAGNOSIS — Z6841 Body Mass Index (BMI) 40.0 and over, adult: Secondary | ICD-10-CM | POA: Diagnosis not present

## 2019-06-05 DIAGNOSIS — R300 Dysuria: Secondary | ICD-10-CM | POA: Diagnosis present

## 2019-06-05 DIAGNOSIS — Q614 Renal dysplasia: Secondary | ICD-10-CM | POA: Diagnosis not present

## 2019-06-05 DIAGNOSIS — M1990 Unspecified osteoarthritis, unspecified site: Secondary | ICD-10-CM | POA: Diagnosis present

## 2019-06-05 DIAGNOSIS — D61818 Other pancytopenia: Secondary | ICD-10-CM | POA: Diagnosis not present

## 2019-06-05 DIAGNOSIS — E1140 Type 2 diabetes mellitus with diabetic neuropathy, unspecified: Secondary | ICD-10-CM | POA: Diagnosis present

## 2019-06-05 DIAGNOSIS — G40909 Epilepsy, unspecified, not intractable, without status epilepticus: Secondary | ICD-10-CM | POA: Diagnosis present

## 2019-06-05 DIAGNOSIS — F419 Anxiety disorder, unspecified: Secondary | ICD-10-CM | POA: Diagnosis present

## 2019-06-05 DIAGNOSIS — N184 Chronic kidney disease, stage 4 (severe): Secondary | ICD-10-CM | POA: Diagnosis not present

## 2019-06-05 DIAGNOSIS — J9611 Chronic respiratory failure with hypoxia: Secondary | ICD-10-CM | POA: Diagnosis present

## 2019-06-05 DIAGNOSIS — E662 Morbid (severe) obesity with alveolar hypoventilation: Secondary | ICD-10-CM | POA: Diagnosis present

## 2019-06-05 DIAGNOSIS — E1165 Type 2 diabetes mellitus with hyperglycemia: Secondary | ICD-10-CM | POA: Diagnosis present

## 2019-06-05 DIAGNOSIS — N185 Chronic kidney disease, stage 5: Secondary | ICD-10-CM | POA: Diagnosis present

## 2019-06-05 DIAGNOSIS — N12 Tubulo-interstitial nephritis, not specified as acute or chronic: Secondary | ICD-10-CM | POA: Diagnosis present

## 2019-06-05 DIAGNOSIS — E785 Hyperlipidemia, unspecified: Secondary | ICD-10-CM | POA: Diagnosis present

## 2019-06-05 DIAGNOSIS — R791 Abnormal coagulation profile: Secondary | ICD-10-CM | POA: Diagnosis present

## 2019-06-05 DIAGNOSIS — E1122 Type 2 diabetes mellitus with diabetic chronic kidney disease: Secondary | ICD-10-CM | POA: Diagnosis present

## 2019-06-05 DIAGNOSIS — G894 Chronic pain syndrome: Secondary | ICD-10-CM | POA: Diagnosis not present

## 2019-06-05 DIAGNOSIS — N179 Acute kidney failure, unspecified: Secondary | ICD-10-CM | POA: Diagnosis not present

## 2019-06-05 DIAGNOSIS — Z7901 Long term (current) use of anticoagulants: Secondary | ICD-10-CM | POA: Diagnosis not present

## 2019-06-05 DIAGNOSIS — R319 Hematuria, unspecified: Secondary | ICD-10-CM | POA: Diagnosis not present

## 2019-06-05 DIAGNOSIS — K7460 Unspecified cirrhosis of liver: Secondary | ICD-10-CM | POA: Diagnosis present

## 2019-06-05 DIAGNOSIS — Z23 Encounter for immunization: Secondary | ICD-10-CM | POA: Diagnosis not present

## 2019-06-05 LAB — CBC
HCT: 27.4 % — ABNORMAL LOW (ref 39.0–52.0)
Hemoglobin: 8.2 g/dL — ABNORMAL LOW (ref 13.0–17.0)
MCH: 29.7 pg (ref 26.0–34.0)
MCHC: 29.9 g/dL — ABNORMAL LOW (ref 30.0–36.0)
MCV: 99.3 fL (ref 80.0–100.0)
Platelets: 68 10*3/uL — ABNORMAL LOW (ref 150–400)
RBC: 2.76 MIL/uL — ABNORMAL LOW (ref 4.22–5.81)
RDW: 14.2 % (ref 11.5–15.5)
WBC: 4.5 10*3/uL (ref 4.0–10.5)
nRBC: 0 % (ref 0.0–0.2)

## 2019-06-05 LAB — BASIC METABOLIC PANEL
Anion gap: 12 (ref 5–15)
BUN: 46 mg/dL — ABNORMAL HIGH (ref 6–20)
CO2: 30 mmol/L (ref 22–32)
Calcium: 8.5 mg/dL — ABNORMAL LOW (ref 8.9–10.3)
Chloride: 95 mmol/L — ABNORMAL LOW (ref 98–111)
Creatinine, Ser: 5.26 mg/dL — ABNORMAL HIGH (ref 0.61–1.24)
GFR calc Af Amer: 13 mL/min — ABNORMAL LOW (ref >60)
GFR calc non Af Amer: 12 mL/min — ABNORMAL LOW (ref >60)
Glucose, Bld: 167 mg/dL — ABNORMAL HIGH (ref 70–99)
Potassium: 4.1 mmol/L (ref 3.5–5.1)
Sodium: 137 mmol/L (ref 135–145)

## 2019-06-05 LAB — PROTIME-INR
INR: 3.5 — ABNORMAL HIGH (ref 0.8–1.2)
Prothrombin Time: 35.4 seconds — ABNORMAL HIGH (ref 11.4–15.2)

## 2019-06-05 LAB — GLUCOSE, CAPILLARY
Glucose-Capillary: 163 mg/dL — ABNORMAL HIGH (ref 70–99)
Glucose-Capillary: 219 mg/dL — ABNORMAL HIGH (ref 70–99)
Glucose-Capillary: 241 mg/dL — ABNORMAL HIGH (ref 70–99)
Glucose-Capillary: 211 mg/dL — ABNORMAL HIGH (ref 70–99)

## 2019-06-05 MED ORDER — PIPERACILLIN-TAZOBACTAM 3.375 G IVPB
3.3750 g | Freq: Three times a day (TID) | INTRAVENOUS | Status: DC
  Administered 2019-06-05 – 2019-06-08 (×11): 3.375 g via INTRAVENOUS
  Filled 2019-06-05 (×11): qty 50

## 2019-06-05 MED ORDER — NYSTATIN 100000 UNIT/GM EX POWD
Freq: Two times a day (BID) | CUTANEOUS | Status: DC
  Administered 2019-06-05 – 2019-06-08 (×6): via TOPICAL
  Filled 2019-06-05 (×3): qty 15

## 2019-06-05 NOTE — Progress Notes (Signed)
PROGRESS NOTE  [name] [id number] DOB: 1966/06/01 DOA: 06/04/2019 PCP: Sharion Balloon, FNP  Brief History:  53 y.o. male with medical history of CKD stage IV, bipolar disorder, chronic respiratory failure on 3-4 L, diabetes mellitus type 2, DVT, OSA/OHS, pulmonary embolus, seizure disorder, thrombocytopenia, hyperlipidemia, chronic pain syndrome presenting with 2 days of dysuria and hematuria.  Apparently, the patient called his primary care provider on 06/01/2019 with dysuria and hematuria.  Urine culture was sent, and the patient was started on Augmentin.  His symptoms have persisted.  In addition, the patient had complained of left-sided flank pain.  He denied any fevers, chills, chest pain, nausea, vomiting, diarrhea. The patient has had 2 recent hospitalizations, most recently 05/09/2019 through 11/162010 which time he was treated for sepsis secondary to Enterococcus faecalis UTI and acute on chronic renal failure.  He was given a penicillin challenge and passed.  He was discharged home with amoxicillin.  During the hospitalization he received 1 unit PRBC.  The patient was also recently hospitalized from 04/09/2019 to 04/25/2019 for acute on chronic respiratory failure secondary to COVID-19 pneumonia. In the emergency department, the patient had low-grade temperature of 99.0 F but was hemodynamically stable.  Oxygen saturation was 94-96% on 3 L.  Initial presentation and showed serum creatinine 4.35 with potassium 3.9.  WBC 3.5, hemoglobin 9.4, platelets 72,000.  Assessment/Plan: Pyuria/hematuria/Pyelonephritis -06/04/19 CT abd/pelvis--increase mild R-pelvicaliectasis with hazy perinephric stranding -d/c ceftriaxone -start zosyn pending cultures  -tolerated zosyn and amox/clav in recent past  CKD4 -wide variations in baseline but ranges from 3.5-4.2 -Suspect the patient has progression of his underlying CKD -He is clinically fluid overloaded -Lasix 40 mg IV twice  daily  Uncontrolled diabetes mellitus type 2, with hyperglycemia -Start reduced dose Lantus -NovoLog sliding scale -04/10/2019 hemoglobin A1c 7.9 -Holding Jardiance and Trulicity  Chronic Respiratory Failure with Hypoxia -secondary to OSA/OHS -stable on 3-4L which is his home demand -Currently appears to be on his baseline oxygen -not on CPAP at home  History of DVT/PE -Continue warfarin  Bipolar disorder -Continue Lamictal and BuSpar and Cymbalta  Chronic pain syndrome -Continue home dose of fentanyl and hydrocodone -Continue Lyrica  Morbid obesity -Lifestyle modification -BMI 53.12  Dyslipidemia -Continue statin  Seizure disorder -Continue Keppra  Thrombocytopenia/Pancytopenia -This has been chronic secondary to chronic hepatic congestion from OSA/OHS and hepatic cirrhosis -patient received 1u PRBC on 05/13/2019 -Serial CBC -Monitor for signs of bleeding        Disposition Plan:   Home in 2-3 days  Family Communication:   Updated spouse 12/7  Consultants:  none  Code Status:  FULL   DVT Prophylaxis:  warfarin   Procedures: As Listed in Progress Note Above  Antibiotics: Ceftriaxone 12/6 Zosyn 12/7>>>     Total time spent 35 minutes.  Greater than 50% spent face to face counseling and coordinating care.     Subjective: Still complains of right flank pain but states it is improving.  Patient denies fevers, chills, headache, chest pain, dyspnea, nausea, vomiting, diarrhea, abdominal pain,  hematochezia, and melena.   Objective: Vitals:   06/04/19 1620 06/04/19 1948 06/04/19 2113 06/05/19 0543  BP:   (!) 113/48 (!) 104/57  Pulse:  85 85 80  Resp:  20 18 17   Temp: 98.4 F (36.9 C)  99 F (37.2 C) 98.6 F (37 C)  TempSrc: Oral  Oral   SpO2:  94% 94% 95%  Weight:  Height:        Intake/Output Summary (Last 24 hours) at 06/05/2019 0832 Last data filed at 06/05/2019 0300 Gross per 24 hour  Intake 1590 ml  Output 220 ml   Net 1370 ml   Weight change:  Exam:   General:  Pt is alert, follows commands appropriately, not in acute distress  HEENT: No icterus, No thrush, No neck mass, [place]/AT  Cardiovascular: RRR, S1/S2, no rubs, no gallops  Respiratory: bibasilar crackles. No wheeze  Abdomen: Soft/+BS, non tender, non distended, no guarding  Extremities: Nonpitting LE edema, No lymphangitis, No petechiae, No rashes, no synovitis   Data Reviewed: I have personally reviewed following labs and imaging studies Basic Metabolic Panel: Recent Labs  Lab 06/04/19 0919  NA 140  K 3.9  CL 95*  CO2 33*  GLUCOSE 161*  BUN 41*  CREATININE 4.35*  CALCIUM 8.5*   Liver Function Tests: Recent Labs  Lab 06/04/19 0919  AST 22  ALT 10  ALKPHOS 68  BILITOT 0.7  PROT 7.3  ALBUMIN 2.7*   No results for input(s): LIPASE, AMYLASE in the last 168 hours. No results for input(s): AMMONIA in the last 168 hours. Coagulation Profile: Recent Labs  Lab 06/04/19 0919  INR 3.9*   CBC: Recent Labs  Lab 06/04/19 0919  WBC 3.5*  NEUTROABS 2.2  HGB 9.4*  HCT 31.2*  MCV 99.0  PLT 72*   Cardiac Enzymes: No results for input(s): CKTOTAL, CKMB, CKMBINDEX, TROPONINI in the last 168 hours. BNP: Invalid input(s): POCBNP CBG: Recent Labs  Lab 06/04/19 2044 06/05/19 0747  GLUCAP 227* 163*   HbA1C: No results for input(s): HGBA1C in the last 72 hours. Urine analysis:    Component Value Date/Time   COLORURINE RED (A) 06/04/2019 1208   APPEARANCEUR TURBID (A) 06/04/2019 1208   APPEARANCEUR Cloudy (A) 06/01/2019 0843   LABSPEC  06/04/2019 1208    TEST NOT REPORTED DUE TO COLOR INTERFERENCE OF URINE PIGMENT   PHURINE  06/04/2019 1208    TEST NOT REPORTED DUE TO COLOR INTERFERENCE OF URINE PIGMENT   GLUCOSEU (A) 06/04/2019 1208    TEST NOT REPORTED DUE TO COLOR INTERFERENCE OF URINE PIGMENT   HGBUR (A) 06/04/2019 1208    TEST NOT REPORTED DUE TO COLOR INTERFERENCE OF URINE PIGMENT   BILIRUBINUR (A)  06/04/2019 1208    TEST NOT REPORTED DUE TO COLOR INTERFERENCE OF URINE PIGMENT   BILIRUBINUR Negative 06/01/2019 0843   KETONESUR (A) 06/04/2019 1208    TEST NOT REPORTED DUE TO COLOR INTERFERENCE OF URINE PIGMENT   PROTEINUR NEGATIVE 06/04/2019 1208   UROBILINOGEN negative 02/22/2015 1427   UROBILINOGEN 0.2 10/17/2010 0951   NITRITE (A) 06/04/2019 1208    TEST NOT REPORTED DUE TO COLOR INTERFERENCE OF URINE PIGMENT   LEUKOCYTESUR (A) 06/04/2019 1208    TEST NOT REPORTED DUE TO COLOR INTERFERENCE OF URINE PIGMENT   Sepsis Labs: @LABRCNTIP (procalcitonin:4,lacticidven:4) ) Recent Results (from the past 240 hour(s))  Urine culture     Status: Abnormal   Collection Time: 06/01/19  8:43 AM   Specimen: Urine   UR  Result Value Ref Range Status   Urine Culture, Routine Final report (A)  Final   Organism ID, Bacteria Enterococcus faecalis (A)  Final    Comment: 50,000-100,000 colony forming units per mL   Antimicrobial Susceptibility Comment  Final    Comment:       ** S = Susceptible; I = Intermediate; R = Resistant **  P = Positive; N = Negative             MICS are expressed in micrograms per mL    Antibiotic                 RSLT#1    RSLT#2    RSLT#3    RSLT#4 Ciprofloxacin                  S Levofloxacin                   S Nitrofurantoin                 S Penicillin                     S Tetracycline                   R Vancomycin                     S   Microscopic Examination     Status: Abnormal   Collection Time: 06/01/19  8:43 AM   URINE  Result Value Ref Range Status   WBC, UA >30 (A) 0 - 5 /hpf Final   RBC 3-10 (A) 0 - 2 /hpf Final   Epithelial Cells (non renal) None seen 0 - 10 /hpf Final   Renal Epithel, UA None seen None seen /hpf Final   Bacteria, UA Many (A) None seen/Few Final     Scheduled Meds:  busPIRone  15 mg Oral BID   calcitRIOL  0.25 mcg Oral Daily   [START ON 06/06/2019] clomiPHENE  25 mg Oral QODAY   DULoxetine  60 mg Oral  Daily   fentaNYL  1 patch Transdermal Q72H   furosemide  80 mg Oral BID   influenza vac split quadrivalent PF  0.5 mL Intramuscular Tomorrow-1000   insulin aspart  0-20 Units Subcutaneous TID WC   insulin aspart  0-5 Units Subcutaneous QHS   insulin glargine  45 Units Subcutaneous BID   lamoTRIgine  200 mg Oral BID   levETIRAcetam  1,000 mg Oral BID   loratadine  10 mg Oral Daily   pantoprazole  40 mg Oral Daily   potassium chloride  30 mEq Oral Daily   pregabalin  50 mg Oral TID   simvastatin  20 mg Oral QHS   tamsulosin  0.4 mg Oral Daily   Continuous Infusions:  cefTRIAXone (ROCEPHIN)  IV      Procedures/Studies: US Renal  Result Date: 05/11/2019 CLINICAL DATA:  Acute renal injury. EXAM: RENAL / URINARY TRACT ULTRASOUND COMPLETE COMPARISON:  Ultrasound 02/21/2019.  CT 12/28/2018. FINDINGS: Right Kidney: Renal measurements: 14.1 x 6.8 x 8.8 cm = volume: 441.5 mL . Echogenicity within normal limits. 3.1 cm complex right renal cyst. Although this appears smaller than noted on prior study, on today's exam a slightly thickened septation is noted within this cyst. No hydronephrosis visualized. Left Kidney: Not visualized. Atrophic left kidney noted on prior CT of 12/28/2018. Bladder: Appears normal for degree of bladder distention. Other: Exam limited due to patient's body habitus. IMPRESSION: 1. 3.1 cm complex right renal cyst is noted. Although this appears smaller than on prior study, on today's exam with slightly thickened septation is noted within the cyst. Further evaluation with renal MRI should be considered. 2.  Left renal atrophy. Electronically Signed   By: Marcello Moores  Register   On: 05/11/2019 12:29  Dg Chest Port 1 View  Result Date: 05/11/2019 CLINICAL DATA:  53 year old male with decreased urination and hematuria. History of COVID-19. EXAM: PORTABLE CHEST 1 VIEW COMPARISON:  Chest radiograph dated 04/19/2019. FINDINGS: Bilateral peripheral and subpleural airspace  densities similar or minimally improved. No new consolidative changes. There is no pleural effusion or pneumothorax. Stable cardiomegaly. No acute osseous pathology. IMPRESSION: Bilateral peripheral and subpleural airspace densities similar or minimally improved compared to the prior radiograph. Electronically Signed   By: Anner Crete M.D.   On: 05/11/2019 14:58   Ct Renal Stone Study  Result Date: 06/04/2019 CLINICAL DATA:  Flank pain. Recurrent urinary tract infections. EXAM: CT ABDOMEN AND PELVIS WITHOUT CONTRAST TECHNIQUE: Multidetector CT imaging of the abdomen and pelvis was performed following the standard protocol without IV contrast. COMPARISON:  12/28/2018 FINDINGS: Lower chest: Increased patchy areas airspace disease are seen in both lower lungs, right side greater than left. No evidence of pleural effusion. Hepatobiliary: No mass visualized on this unenhanced exam. Mild capsular nodularity and left and caudate lobe hypertrophy again seen, highly suspicious for cirrhosis. Prior cholecystectomy. No evidence of biliary obstruction. Pancreas: No mass or inflammatory process visualized on this unenhanced exam. Spleen: Moderate splenomegaly measuring 18-19 cm in length is stable and consistent with portal venous hypertension. Adrenals/Urinary tract: Chronic atrophy of the left kidney with several small peripherally calcified cyst is unchanged in appearance. Compensatory hypertrophy of the right kidney is again seen. Mild increase in hazy opacity is seen in the right perinephric region. Mild right pelvicaliectasis is seen containing high attenuation fluid suspicious for hemorrhage. No evidence of renal or ureteral calculi. Unremarkable unopacified urinary bladder. Stomach/Bowel: No evidence of obstruction, inflammatory process, or abnormal fluid collections. Vascular/Lymphatic: No pathologically enlarged lymph nodes identified. No evidence of abdominal aortic aneurysm. Aortic atherosclerosis.  Reproductive:  No mass or other significant abnormality. Other:  None. Musculoskeletal:  No suspicious bone lesions identified. IMPRESSION: 1. Increased mild right renal pelvicaliectasis and hazy perinephric opacity. No obstructing calculi identified. These findings are nonspecific, but pyelonephritis cannot be excluded. 2. Stable chronic left renal atrophy. 3. Stable findings of hepatic cirrhosis and portal venous hypertension. No evidence of ascites. 4. Increased patchy areas of airspace disease in both lower lungs, right side greater than left, suspicious for atypical infectious or inflammatory process. Aortic Atherosclerosis (ICD10-I70.0). Electronically Signed   By: Marlaine Hind M.D.   On: 06/04/2019 10:13    Orson Eva, DO  [facility] Pager [phone]  If 7PM-7AM, please contact night-coverage [email or web address] Password [email or web address] 06/05/2019, 8:32 AM   LOS: 0 days

## 2019-06-05 NOTE — Telephone Encounter (Signed)
Emgality renewal PA completed on CMM. Key: [id number]. Awaiting [facility] Rx determination.

## 2019-06-05 NOTE — Progress Notes (Addendum)
ANTICOAGULATION CONSULT NOTE - Initial Consult  Pharmacy Consult for warfarin Indication: history of DVT/PE  Allergies  Allergen Reactions  . Bee Venom Anaphylaxis, Shortness Of Breath and Swelling  . Other Shortness Of Breath    Itching, rash with IVP DYE, iodine, shellfish LATEX  . Shellfish Allergy Nausea And Vomiting and Other (See Comments)    Feels like insides are twisting  . Iodinated Diagnostic Agents     Other reaction(s): RASH  . Iohexol      Code: RASH, Desc: PT WAS ON PREDNISONE FOR GOUT TX. @ TIME OF SCAN AND RECEIVED 50 MG OF BENADRYL IV-ARS 10/08/07   . Iodine Rash  . Latex Rash    Patient Measurements: Height: 6' 3" (190.5 cm) Weight: (!) 436 lb 1.6 oz (197.8 kg) IBW/kg (Calculated) : 84.5   Vital Signs: Temp: 98.6 F (37 C) (12/07 0543) BP: 104/57 (12/07 0543) Pulse Rate: 80 (12/07 0543)  Labs: Recent Labs    06/04/19 0919 06/05/19 0958 06/05/19 1048  HGB 9.4* 8.2*  --   HCT 31.2* 27.4*  --   PLT 72* 68*  --   LABPROT 38.2*  --  35.4*  INR 3.9*  --  3.5*  CREATININE 4.35* 5.26*  --     Estimated Creatinine Clearance: 30.2 mL/min (A) (by C-G formula based on SCr of 5.26 mg/dL (H)).   Medical History: Past Medical History:  Diagnosis Date  . Anemia   . Anxiety   . Arthritis   . Bipolar 1 disorder ([place])   . Bronchitis    hx of  . Bruises easily   . Chronic kidney disease    decreased left kidney fx  . Chronic pain syndrome 05/11/2012  . Chronic respiratory failure with hypoxia ([facility])    And with hypercapnia  . Diabetes mellitus without complication ([place])   . Diabetic neuropathy ([place])   . DVT (deep venous thrombosis) ([facility])    LLE DVT ~ '12  . Dyspnea    with ambulation  . GERD (gastroesophageal reflux disease)   . History of 2019 novel coronavirus disease (COVID-19)   . HOH (hard of hearing) 2015   has hearing aids  . Mental disorder   . Migraine   . Neuromuscular disorder ([place])   . Obesity hypoventilation syndrome ([place])   .  Obstructive sleep apnea    CPAP  . PE (pulmonary embolism)    bilateral PE ~ '11  . Seizures ([place])   . Thrombocytopenia ([place]) 05/11/2012    Medications:  Medications Prior to Admission  Medication Sig Dispense Refill Last Dose  . amoxicillin-clavulanate (AUGMENTIN) 875-125 MG tablet Take 1 tablet by mouth 2 (two) times daily. 20 tablet 0 06/04/2019 at Unknown time  . busPIRone (BUSPAR) 15 MG tablet TAKE 1 TABLET (15 MG TOTAL) BY MOUTH 2 (TWO) TIMES DAILY. (Patient taking differently: Take 15 mg by mouth 2 (two) times daily. TAKE 1 TABLET (15 MG TOTAL) BY MOUTH 2 (TWO) TIMES DAILY.) 180 tablet 0 06/04/2019 at Unknown time  . calcitRIOL (ROCALTROL) 0.25 MCG capsule Take 0.25 mcg by mouth daily.   06/04/2019 at Unknown time  . cetirizine (ZYRTEC) 10 MG tablet Take 1 tablet (10 mg total) by mouth daily. 30 tablet 11 06/04/2019 at Unknown time  . clomiPHENE (CLOMID) 50 MG tablet Take 0.5 tablets (25 mg total) by mouth every other day. 24 tablet 3 06/04/2019 at Unknown time  . cyclobenzaprine (FLEXERIL) 10 MG tablet TAKE 1/2 TO 1 TABLET BY MOUTH AS NEEDED AT BEDTIME  FOR MUSCLE CRAMPS / SPASMS (Patient taking differently: Take 10 mg by mouth daily as needed. TAKE 1/2 TO 1 TABLET BY MOUTH AS NEEDED AT BEDTIME FOR MUSCLE CRAMPS / SPASMS) 90 tablet 3 06/03/2019 at Unknown time  . docusate sodium (COLACE) 100 MG capsule Take 1 capsule (100 mg total) by mouth 2 (two) times daily. 10 capsule 0 06/04/2019 at Unknown time  . DULoxetine (CYMBALTA) 60 MG capsule TAKE 1 CAPSULE BY MOUTH EVERY DAY (Patient taking differently: Take 60 mg by mouth daily. ) 90 capsule 0 06/04/2019 at Unknown time  . EMGALITY 120 MG/ML SOAJ Inject 120 mg as directed every 30 (thirty) days.    Past Month at Unknown time  . EPIPEN 2-PAK 0.3 MG/0.3ML SOAJ injection INJECT 0.3 MLS (0.3 MG TOTAL) INTO THE MUSCLE ONCE. AS NEEDED FOR ANAPHYLACTIC REACTION 2 Device 2   . esomeprazole (NEXIUM) 40 MG capsule TAKE 1 CAPSULE (40 MG TOTAL) BY MOUTH DAILY  AT 12 NOON. 90 capsule 0 06/04/2019 at Unknown time  . fentaNYL (DURAGESIC) 75 MCG/HR Place 1 patch onto the skin every 3 (three) days. 10 patch 0 06/02/2019 at Unknown time  . fluticasone (FLONASE) 50 MCG/ACT nasal spray PLACE 1 SPRAY INTO BOTH NOSTRILS 2 (TWO) TIMES DAILY AS NEEDED FOR ALLERGIES. 48 g 4 06/03/2019 at Unknown time  . furosemide (LASIX) 80 MG tablet Take 1 tablet (80 mg total) by mouth 2 (two) times daily. 180 tablet 1 06/04/2019 at Unknown time  . HYDROcodone-acetaminophen (NORCO) 7.5-325 MG tablet Take 1 tablet by mouth every 6 (six) hours as needed for moderate pain. 90 tablet 0 06/04/2019 at Unknown time  . Insulin Glargine, 1 Unit Dial, (TOUJEO SOLOSTAR) 300 UNIT/ML SOPN Inject 60 Units into the skin 2 (two) times daily. (Patient taking differently: Inject 200 Units into the skin daily. ) 130 pen 3 06/04/2019 at Unknown time  . insulin lispro (HUMALOG) 100 UNIT/ML KwikPen Inject 0.25 mLs (25 Units total) into the skin 3 (three) times daily. (Patient taking differently: Inject 50 Units into the skin 3 (three) times daily. ) 45 mL 0 06/04/2019 at Unknown time  . JARDIANCE 25 MG TABS tablet Take 25 mg by mouth daily.    06/04/2019 at Unknown time  . KLOR-CON M10 10 MEQ tablet TAKE 3 TABLETS (30 MEQ TOTAL) BY MOUTH DAILY. (Patient taking differently: Take 30 mEq by mouth daily. ) 270 tablet 0 06/04/2019 at Unknown time  . lamoTRIgine (LAMICTAL) 200 MG tablet TAKE 1 TABLET BY MOUTH TWICE A DAY (Patient taking differently: Take 200 mg by mouth 2 (two) times daily. ) 180 tablet 0 06/04/2019 at Unknown time  . levETIRAcetam (KEPPRA) 1000 MG tablet Take 1 tablet (1,000 mg total) by mouth 2 (two) times daily. 180 tablet 3 06/04/2019 at Unknown time  . NARCAN 4 MG/0.1ML LIQD nasal spray kit SMARTSIG:1 Spray(s) Both Nares As Needed     . pregabalin (LYRICA) 50 MG capsule TAKE 1 CAPSULE BY MOUTH THREE TIMES A DAY (Patient taking differently: Take 50 mg by mouth 3 (three) times daily. TAKE 1 CAPSULE BY  MOUTH THREE TIMES A DAY) 270 capsule 1 06/04/2019 at Unknown time  . PROAIR HFA 108 (90 Base) MCG/ACT inhaler TAKE 2 PUFFS BY MOUTH EVERY 6 HOURS AS NEEDED FOR WHEEZE OR SHORTNESS OF BREATH 18 g 0 06/03/2019 at Unknown time  . rizatriptan (MAXALT) 10 MG tablet May repeat in 2 hours if needed (Patient taking differently: Take 10 mg by mouth as needed. May repeat in 2 hours  if needed) 10 tablet 11   . simvastatin (ZOCOR) 20 MG tablet Take 1 tablet (20 mg total) by mouth at bedtime. 90 tablet 3 06/03/2019 at Unknown time  . tamsulosin (FLOMAX) 0.4 MG CAPS capsule TAKE 1 CAPSULE BY MOUTH EVERY DAY (Patient taking differently: Take 0.4 mg by mouth daily. TAKE 1 CAPSULE BY MOUTH EVERY DAY) 90 capsule 0 06/04/2019 at Unknown time  . TRULICITY 1.52 YO/3.7CH SOPN Inject 1.5 mg as directed once a week.    Past Week at Unknown time  . vitamin C (VITAMIN C) 500 MG tablet Take 1 tablet (500 mg total) by mouth daily.   06/04/2019 at Unknown time  . Vitamin D, Ergocalciferol, (DRISDOL) 1.25 MG (50000 UT) CAPS capsule Take 50,000 Units by mouth once a week.   Past Week at Unknown time  . warfarin (COUMADIN) 5 MG tablet Take 1 tablet (5 mg total) by mouth daily. Take 81m daily 90 tablet 2 06/03/2019 at 1800  . zinc sulfate 220 (50 Zn) MG capsule Take 1 capsule (220 mg total) by mouth 2 (two) times daily.   06/04/2019 at Unknown time  . warfarin (COUMADIN) 3 MG tablet Take 1 tablet (3 mg total) by mouth one time only at 6 PM. (Patient not taking: Reported on 06/04/2019) 30 tablet 0 Not Taking at 1800    Assessment: Pharmacy consulted to dose warfarin in patient with history of DVT/PE.  Home warfarin dose listed as 5 mg daily with last dose on 12/5 1800.  INR supratherapeutic at 3.5  Goal of Therapy:  INR 2-3 Monitor platelets by anticoagulation protocol: Yes   Plan:  Hold warfarin x 1 dose. Monitor daily INR and s/s of bleeding.  SRevonda StandardHurth 06/05/2019,12:23 PM

## 2019-06-06 LAB — BASIC METABOLIC PANEL
Anion gap: 12 (ref 5–15)
BUN: 51 mg/dL — ABNORMAL HIGH (ref 6–20)
CO2: 30 mmol/L (ref 22–32)
Calcium: 8.2 mg/dL — ABNORMAL LOW (ref 8.9–10.3)
Chloride: 96 mmol/L — ABNORMAL LOW (ref 98–111)
Creatinine, Ser: 5.7 mg/dL — ABNORMAL HIGH (ref 0.61–1.24)
GFR calc Af Amer: 12 mL/min — ABNORMAL LOW (ref >60)
GFR calc non Af Amer: 11 mL/min — ABNORMAL LOW (ref >60)
Glucose, Bld: 206 mg/dL — ABNORMAL HIGH (ref 70–99)
Potassium: 4.4 mmol/L (ref 3.5–5.1)
Sodium: 138 mmol/L (ref 135–145)

## 2019-06-06 LAB — IRON AND TIBC
Iron: 35 ug/dL — ABNORMAL LOW (ref 45–182)
Saturation Ratios: 16 % — ABNORMAL LOW (ref 17.9–39.5)
TIBC: 221 ug/dL — ABNORMAL LOW (ref 250–450)
UIBC: 186 ug/dL

## 2019-06-06 LAB — FERRITIN: Ferritin: 70 ng/mL (ref 24–336)

## 2019-06-06 LAB — URINE CULTURE: Culture: NO GROWTH

## 2019-06-06 LAB — GLUCOSE, CAPILLARY
Glucose-Capillary: 192 mg/dL — ABNORMAL HIGH (ref 70–99)
Glucose-Capillary: 243 mg/dL — ABNORMAL HIGH (ref 70–99)
Glucose-Capillary: 213 mg/dL — ABNORMAL HIGH (ref 70–99)
Glucose-Capillary: 237 mg/dL — ABNORMAL HIGH (ref 70–99)

## 2019-06-06 LAB — CBC
HCT: 26.5 % — ABNORMAL LOW (ref 39.0–52.0)
Hemoglobin: 7.9 g/dL — ABNORMAL LOW (ref 13.0–17.0)
MCH: 29.7 pg (ref 26.0–34.0)
MCHC: 29.8 g/dL — ABNORMAL LOW (ref 30.0–36.0)
MCV: 99.6 fL (ref 80.0–100.0)
Platelets: 65 10*3/uL — ABNORMAL LOW (ref 150–400)
RBC: 2.66 MIL/uL — ABNORMAL LOW (ref 4.22–5.81)
RDW: 14.2 % (ref 11.5–15.5)
WBC: 4.3 10*3/uL (ref 4.0–10.5)
nRBC: 0 % (ref 0.0–0.2)

## 2019-06-06 LAB — PROTIME-INR
INR: 2.9 — ABNORMAL HIGH (ref 0.8–1.2)
Prothrombin Time: 30 seconds — ABNORMAL HIGH (ref 11.4–15.2)

## 2019-06-06 MED ORDER — SENNA 8.6 MG PO TABS
2.0000 | ORAL_TABLET | Freq: Every day | ORAL | Status: DC
  Administered 2019-06-06 – 2019-06-08 (×3): 17.2 mg via ORAL
  Filled 2019-06-06 (×3): qty 2

## 2019-06-06 MED ORDER — WARFARIN - PHARMACIST DOSING INPATIENT
Freq: Every day | Status: DC
  Administered 2019-06-07: 18:00:00

## 2019-06-06 MED ORDER — SALINE SPRAY 0.65 % NA SOLN
1.0000 | NASAL | Status: DC | PRN
  Filled 2019-06-06: qty 44

## 2019-06-06 MED ORDER — POLYETHYLENE GLYCOL 3350 17 G PO PACK
17.0000 g | PACK | Freq: Every day | ORAL | Status: DC
  Administered 2019-06-06 – 2019-06-08 (×3): 17 g via ORAL
  Filled 2019-06-06 (×2): qty 1

## 2019-06-06 MED ORDER — WARFARIN SODIUM 5 MG PO TABS: 5.0000 mg | ORAL_TABLET | Freq: Every day | ORAL | Status: DC

## 2019-06-06 MED ORDER — CHLORHEXIDINE GLUCONATE CLOTH 2 % EX PADS
6.0000 | MEDICATED_PAD | Freq: Every day | CUTANEOUS | Status: DC
  Administered 2019-06-06 – 2019-06-08 (×3): 6 via TOPICAL

## 2019-06-06 MED ORDER — WARFARIN SODIUM 5 MG PO TABS: 5.0000 mg | ORAL_TABLET | Freq: Once | ORAL | Status: DC
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
## 2019-06-06 NOTE — Progress Notes (Addendum)
ANTICOAGULATION CONSULT NOTE -   Pharmacy Consult for warfarin Indication: history of DVT/PE  Allergies  Allergen Reactions  . Bee Venom Anaphylaxis, Shortness Of Breath and Swelling  . Other Shortness Of Breath    Itching, rash with IVP DYE, iodine, shellfish LATEX  . Shellfish Allergy Nausea And Vomiting and Other (See Comments)    Feels like insides are twisting  . Iodinated Diagnostic Agents     Other reaction(s): RASH  . Iohexol      Code: RASH, Desc: PT WAS ON PREDNISONE FOR GOUT TX. @ TIME OF SCAN AND RECEIVED 50 MG OF BENADRYL IV-ARS 10/08/07   . Iodine Rash  . Latex Rash    Patient Measurements: Height: 6' 3"  (190.5 cm) Weight: (!) 436 lb 1.6 oz (197.8 kg) IBW/kg (Calculated) : 84.5   Vital Signs: Temp: 98.7 F (37.1 C) (12/08 0632) Temp Source: Oral (12/08 6948) BP: 114/48 (12/08 5462) Pulse Rate: 79 (12/08 0632)  Labs: Recent Labs    06/04/19 0919 06/05/19 0958 06/05/19 1048 06/06/19 0556  HGB 9.4* 8.2*  --  7.9*  HCT 31.2* 27.4*  --  26.5*  PLT 72* 68*  --  65*  LABPROT 38.2*  --  35.4* 30.0*  INR 3.9*  --  3.5* 2.9*  CREATININE 4.35* 5.26*  --  5.70*    Estimated Creatinine Clearance: 27.8 mL/min (A) (by C-G formula based on SCr of 5.7 mg/dL (H)).   Medical History: Past Medical History:  Diagnosis Date  . Anemia   . Anxiety   . Arthritis   . Bipolar 1 disorder ([place])   . Bronchitis    hx of  . Bruises easily   . Chronic kidney disease    decreased left kidney fx  . Chronic pain syndrome 05/11/2012  . Chronic respiratory failure with hypoxia (HCC)    And with hypercapnia  . Diabetes mellitus without complication ([place])   . Diabetic neuropathy ([place])   . DVT (deep venous thrombosis) (HCC)    LLE DVT ~ '12  . Dyspnea    with ambulation  . GERD (gastroesophageal reflux disease)   . History of 2019 novel coronavirus disease (COVID-19)   . HOH (hard of hearing) 2015   has hearing aids  . Mental disorder   . Migraine   . Neuromuscular  disorder ([place])   . Obesity hypoventilation syndrome ([place])   . Obstructive sleep apnea    CPAP  . PE (pulmonary embolism)    bilateral PE ~ '11  . Seizures ([place])   . Thrombocytopenia ([place]) 05/11/2012    Medications:  Medications Prior to Admission  Medication Sig Dispense Refill Last Dose  . amoxicillin-clavulanate (AUGMENTIN) 875-125 MG tablet Take 1 tablet by mouth 2 (two) times daily. 20 tablet 0 06/04/2019 at Unknown time  . busPIRone (BUSPAR) 15 MG tablet TAKE 1 TABLET (15 MG TOTAL) BY MOUTH 2 (TWO) TIMES DAILY. (Patient taking differently: Take 15 mg by mouth 2 (two) times daily. TAKE 1 TABLET (15 MG TOTAL) BY MOUTH 2 (TWO) TIMES DAILY.) 180 tablet 0 06/04/2019 at Unknown time  . calcitRIOL (ROCALTROL) 0.25 MCG capsule Take 0.25 mcg by mouth daily.   06/04/2019 at Unknown time  . cetirizine (ZYRTEC) 10 MG tablet Take 1 tablet (10 mg total) by mouth daily. 30 tablet 11 06/04/2019 at Unknown time  . clomiPHENE (CLOMID) 50 MG tablet Take 0.5 tablets (25 mg total) by mouth every other day. 24 tablet 3 06/04/2019 at Unknown time  . cyclobenzaprine (FLEXERIL) 10 MG  tablet TAKE 1/2 TO 1 TABLET BY MOUTH AS NEEDED AT BEDTIME FOR MUSCLE CRAMPS / SPASMS (Patient taking differently: Take 10 mg by mouth daily as needed. TAKE 1/2 TO 1 TABLET BY MOUTH AS NEEDED AT BEDTIME FOR MUSCLE CRAMPS / SPASMS) 90 tablet 3 06/03/2019 at Unknown time  . docusate sodium (COLACE) 100 MG capsule Take 1 capsule (100 mg total) by mouth 2 (two) times daily. 10 capsule 0 06/04/2019 at Unknown time  . DULoxetine (CYMBALTA) 60 MG capsule TAKE 1 CAPSULE BY MOUTH EVERY DAY (Patient taking differently: Take 60 mg by mouth daily. ) 90 capsule 0 06/04/2019 at Unknown time  . EMGALITY 120 MG/ML SOAJ Inject 120 mg as directed every 30 (thirty) days.    Past Month at Unknown time  . EPIPEN 2-PAK 0.3 MG/0.3ML SOAJ injection INJECT 0.3 MLS (0.3 MG TOTAL) INTO THE MUSCLE ONCE. AS NEEDED FOR ANAPHYLACTIC REACTION 2 Device 2   . esomeprazole  (NEXIUM) 40 MG capsule TAKE 1 CAPSULE (40 MG TOTAL) BY MOUTH DAILY AT 12 NOON. 90 capsule 0 06/04/2019 at Unknown time  . fentaNYL (DURAGESIC) 75 MCG/HR Place 1 patch onto the skin every 3 (three) days. 10 patch 0 06/02/2019 at Unknown time  . fluticasone (FLONASE) 50 MCG/ACT nasal spray PLACE 1 SPRAY INTO BOTH NOSTRILS 2 (TWO) TIMES DAILY AS NEEDED FOR ALLERGIES. 48 g 4 06/03/2019 at Unknown time  . furosemide (LASIX) 80 MG tablet Take 1 tablet (80 mg total) by mouth 2 (two) times daily. 180 tablet 1 06/04/2019 at Unknown time  . HYDROcodone-acetaminophen (NORCO) 7.5-325 MG tablet Take 1 tablet by mouth every 6 (six) hours as needed for moderate pain. 90 tablet 0 06/04/2019 at Unknown time  . Insulin Glargine, 1 Unit Dial, (TOUJEO SOLOSTAR) 300 UNIT/ML SOPN Inject 60 Units into the skin 2 (two) times daily. (Patient taking differently: Inject 200 Units into the skin daily. ) 130 pen 3 06/04/2019 at Unknown time  . insulin lispro (HUMALOG) 100 UNIT/ML KwikPen Inject 0.25 mLs (25 Units total) into the skin 3 (three) times daily. (Patient taking differently: Inject 50 Units into the skin 3 (three) times daily. ) 45 mL 0 06/04/2019 at Unknown time  . JARDIANCE 25 MG TABS tablet Take 25 mg by mouth daily.    06/04/2019 at Unknown time  . KLOR-CON M10 10 MEQ tablet TAKE 3 TABLETS (30 MEQ TOTAL) BY MOUTH DAILY. (Patient taking differently: Take 30 mEq by mouth daily. ) 270 tablet 0 06/04/2019 at Unknown time  . lamoTRIgine (LAMICTAL) 200 MG tablet TAKE 1 TABLET BY MOUTH TWICE A DAY (Patient taking differently: Take 200 mg by mouth 2 (two) times daily. ) 180 tablet 0 06/04/2019 at Unknown time  . levETIRAcetam (KEPPRA) 1000 MG tablet Take 1 tablet (1,000 mg total) by mouth 2 (two) times daily. 180 tablet 3 06/04/2019 at Unknown time  . NARCAN 4 MG/0.1ML LIQD nasal spray kit SMARTSIG:1 Spray(s) Both Nares As Needed     . pregabalin (LYRICA) 50 MG capsule TAKE 1 CAPSULE BY MOUTH THREE TIMES A DAY (Patient taking  differently: Take 50 mg by mouth 3 (three) times daily. TAKE 1 CAPSULE BY MOUTH THREE TIMES A DAY) 270 capsule 1 06/04/2019 at Unknown time  . PROAIR HFA 108 (90 Base) MCG/ACT inhaler TAKE 2 PUFFS BY MOUTH EVERY 6 HOURS AS NEEDED FOR WHEEZE OR SHORTNESS OF BREATH 18 g 0 06/03/2019 at Unknown time  . rizatriptan (MAXALT) 10 MG tablet May repeat in 2 hours if needed (Patient taking differently:  Take 10 mg by mouth as needed. May repeat in 2 hours if needed) 10 tablet 11   . simvastatin (ZOCOR) 20 MG tablet Take 1 tablet (20 mg total) by mouth at bedtime. 90 tablet 3 06/03/2019 at Unknown time  . tamsulosin (FLOMAX) 0.4 MG CAPS capsule TAKE 1 CAPSULE BY MOUTH EVERY DAY (Patient taking differently: Take 0.4 mg by mouth daily. TAKE 1 CAPSULE BY MOUTH EVERY DAY) 90 capsule 0 06/04/2019 at Unknown time  . TRULICITY 1.5 RC/1.6LA SOPN Inject 1.5 mg as directed once a week.    Past Week at Unknown time  . vitamin C (VITAMIN C) 500 MG tablet Take 1 tablet (500 mg total) by mouth daily.   06/04/2019 at Unknown time  . Vitamin D, Ergocalciferol, (DRISDOL) 1.25 MG (50000 UT) CAPS capsule Take 50,000 Units by mouth once a week.   Past Week at Unknown time  . warfarin (COUMADIN) 5 MG tablet Take 1 tablet (5 mg total) by mouth daily. Take 70m daily 90 tablet 2 06/03/2019 at 1800  . zinc sulfate 220 (50 Zn) MG capsule Take 1 capsule (220 mg total) by mouth 2 (two) times daily.   06/04/2019 at Unknown time  . warfarin (COUMADIN) 3 MG tablet Take 1 tablet (3 mg total) by mouth one time only at 6 PM. (Patient not taking: Reported on 06/04/2019) 30 tablet 0 Not Taking at 1800    Assessment: Pharmacy consulted to dose warfarin in patient with history of DVT/PE.  Home warfarin dose listed as 5 mg daily with last dose on 12/5 1800.  INR now therapeutic at 2.9- holding today per MD  Goal of Therapy:  INR 2-3 Monitor platelets by anticoagulation protocol: Yes   Plan:  Hold warfarin x 1 dose per MD Monitor daily INR and s/s of  bleeding.  SRevonda StandardHurth 06/06/2019,10:16 AM

## 2019-06-06 NOTE — TOC Initial Note (Signed)
Transition of Care [facility]) - Initial/Assessment Note    Patient Details  Name: [name] MRN: [id number] Date of Birth: August 07, 1965  Transition of Care [facility]) CM/SW Contact:    [place], LCSW Phone Number: 06/06/2019, 2:07 PM  Clinical Narrative:  CSW received verbal permission from patient to conduct TOC assessment with the assistance of his wife, Dijion Amerman [facility]: [phone] . [name] reports that patient resides at home alone. [name] adds that she provides much assistance for patient in doing ADLs including bathing, dressing, and assistance with toileting.   Pt is currently being following by [facility] care and receives PT 2x weekly and RN visits 1x weekly. Pt has the following DME in the home: power chair, bedside commode. Family is interested in receiving a sliding board during the visit.   Pt uses transportation through his insurance to attend medical appointments.   Family states that their goal is for Patient to finish receiving treatment for UTI and return home with resumption of services from [place].  [employer] Transitions of Care  Clinical Social Worker  Ph: [phone]            Expected Discharge Plan: [place] Barriers to Discharge: Continued Medical Work up   Patient Goals and CMS Choice Patient states their goals for this hospitalization and ongoing recovery are:: to clear up UTI and to return home      Expected Discharge Plan and Services Expected Discharge Plan: [place] In-house Referral: Clinical Social Work Discharge Planning Services: CM Consult Post Acute Care Choice: Home Health, Durable Medical Equipment, Resumption of Svcs/PTA Provider Living arrangements for the past 2 months: [place]                   DME Agency: [facility]                  Prior Living Arrangements/Services Living arrangements for the past 2 months: Single Family Home Lives  with:: Spouse Patient language and need for interpreter reviewed:: Yes Do you feel safe going back to the place where you live?: Yes      Need for Family Participation in Patient Care: Yes (Comment) Care giver support system in place?: Yes (comment) Current home services: DME, Home OT, Home PT Criminal Activity/Legal Involvement Pertinent to Current Situation/Hospitalization: No - Comment as needed  Activities of Daily Living Home Assistive Devices/Equipment: Electric scooter ADL Screening (condition at time of admission) Patient's cognitive ability adequate to safely complete daily activities?: Yes Is the patient deaf or have difficulty hearing?: No Does the patient have difficulty seeing, even when wearing glasses/contacts?: No Does the patient have difficulty concentrating, remembering, or making decisions?: No Patient able to express need for assistance with ADLs?: Yes Does the patient have difficulty dressing or bathing?: Yes Independently performs ADLs?: No Communication: Independent Dressing (OT): Needs assistance Is this a change from baseline?: Pre-admission baseline Grooming: Needs assistance Is this a change from baseline?: Pre-admission baseline Feeding: Independent Is this a change from baseline?: Pre-admission baseline Bathing: Needs assistance Is this a change from baseline?: Pre-admission baseline Toileting: Needs assistance Is this a change from baseline?: Pre-admission baseline In/Out Bed: Dependent Is this a change from baseline?: Pre-admission baseline Walks in Home: Dependent Is this a change from baseline?: Pre-admission baseline Does the patient have difficulty walking or climbing stairs?: Yes Weakness of Legs: Both Weakness of Arms/Hands: Both  Permission Sought/Granted Permission sought to share  information with : Family Supports Permission granted to share information with : Yes, Verbal Permission Granted  Share Information with NAME: Yuepheng Schleeter  Permission granted to share info w AGENCY: [facility]  Permission granted to share info w Relationship: Spouse  Permission granted to share info w Contact Information: PH: [phone]  Emotional Assessment Appearance:: Appears stated age Attitude/Demeanor/Rapport: Unable to Assess Affect (typically observed): Unable to Assess Orientation: : Oriented to Self, Oriented to Place, Oriented to  Time, Oriented to Situation Alcohol / Substance Use: Not Applicable Psych Involvement: No (comment)  Admission diagnosis:  Cystitis [N30.90] Patient Active Problem List   Diagnosis Date Noted  . Acute pyelonephritis 06/05/2019  . Pyelonephritis 06/05/2019  . UTI (urinary tract infection) 06/04/2019  . Enterococcus faecalis infection 05/11/2019  . Pancytopenia ([place]) 05/11/2019  . Elevated INR 05/11/2019  . Acute renal failure superimposed on stage 4 chronic kidney disease ([place]) 05/09/2019  . Chronic respiratory failure with hypoxia ([place]) 04/12/2019  . History of pulmonary embolus (PE) 04/12/2019  . Morbid obesity with BMI of 60.0-69.9, adult ([place]) 04/12/2019  . Seizures ([place]) 04/12/2019  . Diabetes mellitus type 2, uncontrolled, with complications ([place]) [id number]  . Acute renal failure superimposed on stage 3b chronic kidney disease ([place]) 04/12/2019  . Pressure injury of skin 04/11/2019  . Pneumonia due to COVID-19 virus 04/09/2019  . Tremor 02/22/2019  . Chronic back pain 11/10/2018  . Depression 05/24/2017  . Chronic migraine without aura, intractable, with status migrainosus 04/02/2017  . Pain medication agreement signed 08/30/2015  . Opioid dependence ([place]) 08/02/2015  . Hyperlipidemia associated with type 2 diabetes mellitus ([place]) 05/31/2015  . Other vascular headache 05/03/2015  . Vitamin D deficiency 11/23/2014  . Testosterone deficiency 11/23/2014  . Intractable chronic migraine without aura 10/02/2014  . GAD (generalized anxiety disorder) 01/24/2014  . GERD  (gastroesophageal reflux disease) 01/24/2014  . BPH (benign prostatic hyperplasia) 01/24/2014  . Postoperative pulmonary edema ([place]) 09/15/2012  . History of DVT (deep vein thrombosis) 09/15/2012  . Anemia 05/11/2012  . Thrombocytopenia ([place]) 05/11/2012  . Acute renal failure ([place]) 05/11/2012  . Chronic pain syndrome 05/11/2012  . Morbid obesity ([place]) 05/10/2012  . OSA (obstructive sleep apnea) 05/10/2012  . Obesity hypoventilation syndrome ([place]) 05/10/2012  . DM2 (diabetes mellitus, type 2) ([place]) 05/10/2012  . History of pulmonary embolism 05/10/2012  . Chronic anticoagulation 05/10/2012  . Seizure disorder ([place]) 05/10/2012  . DYSPNEA 02/05/2009   PCP:  Sharion Balloon, FNP Pharmacy:   [id number] - [place], [address] [place] [address] Phone: [phone] Fax: [phone]     Social Determinants of Health (SDOH) Interventions    Readmission Risk Interventions Readmission Risk Prevention Plan 05/10/2019  Transportation Screening Complete  Medication Review (RN Care Manager) Complete  [place] Not Applicable  Some recent data might be hidden

## 2019-06-06 NOTE — Progress Notes (Signed)
PROGRESS NOTE  [name] [id number] DOB: [birth year] DOA: 06/04/2019 PCP: Sharion Balloon, FNP  Brief History:  53 y.o.malewith medical history ofCKD stage IV, bipolar disorder, chronic respiratory failure on 3-4 L, diabetes mellitus type 2, DVT, OSA/OHS, pulmonary embolus, seizure disorder, thrombocytopenia, hyperlipidemia, chronic pain syndrome presenting with 2 days of dysuria and hematuria.  Apparently, the patient called his primary care provider on 06/01/2019 with dysuria and hematuria.  Urine culture was sent, and the patient was started on Augmentin.  His symptoms have persisted.  In addition, the patient had complained of left-sided flank pain.  He denied any fevers, chills, chest pain, nausea, vomiting, diarrhea. The patient has had 2 recent hospitalizations, most recently 05/09/2019 through 11/162010 which time he was treated for sepsis secondary to Enterococcus faecalis UTI and acute on chronic renal failure.  He was given a penicillin challenge and passed.  He was discharged home with amoxicillin.  During the hospitalization he received 1 unit PRBC.  The patient was also recently hospitalized from 04/09/2019 to 04/25/2019 for acute on chronic respiratory failure secondary to COVID-19 pneumonia. In the emergency department, the patient had low-grade temperature of 99.0 F but was hemodynamically stable.  Oxygen saturation was 94-96% on 3 L.  Initial presentation and showed serum creatinine 4.35 with potassium 3.9.  WBC 3.5, hemoglobin 9.4, platelets 72,000.  Assessment/Plan: Pyuria/hematuria/Pyelonephritis -06/04/19 CT abd/pelvis--increase mild R-pelvicaliectasis with hazy perinephric stranding -d/c ceftriaxone -continue zosyn--12/6 urine culture neg, but 12/3 urine = Efaecalis -tolerated zosyn and amox/clav in recent past -urology consult appreciated-->continue nonoperative management -case discussed with Dr. Gloriann Loan (urology) -12/8--hematuria improving when  comparing pictures of hematuria from home -baseline Hgb 8-9 -holding warfarin 12/8; restart 12/9 if Hgb stable and urine continues to "lighten" up  Acute on chronic renal failure--CKD4 -wide variations in baseline but ranges from 3.5-4.2 -Suspect the patient has progression of his underlying CKD -holding furosemide due to increasing serum creatinine -appreciate nephrology consult--discussed with Dr. Royce Macadamia  Uncontrolled diabetes mellitus type 2, with hyperglycemia -Continue reduced dose Lantus -NovoLog sliding scale -04/10/2019 hemoglobin A1c 7.9 -Holding Jardiance and Trulicity  Chronic Respiratory Failure with Hypoxia -secondary to OSA/OHS -stable on 3-4L which is his home demand -Currently appears to be on his baseline oxygen -not on CPAP at home  History of DVT/PE -Hold warfarin today -plan to restart 12/9   Bipolar disorder -Continue Lamictal and BuSpar and Cymbalta  Chronic pain syndrome -Continue home dose of fentanyl and hydrocodone -Continue Lyrica  Morbid obesity -Lifestyle modification -BMI 53.12  Dyslipidemia -Continue statin  Seizure disorder -Continue Keppra  Thrombocytopenia/Pancytopenia -This has been chronic secondary to chronic hepatic congestion from OSA/OHS and hepatic cirrhosis -patient received 1u PRBC on 05/13/2019 -Serial CBC -Monitor for signs of bleeding        Disposition Plan:   Home in 2-3 days  Family Communication:   Updated spouse 12/8  Consultants:  none  Code Status:  FULL   DVT Prophylaxis:  warfarin   Procedures: As Listed in Progress Note Above  Antibiotics: Ceftriaxone 12/6 Zosyn 12/7>>>   Total time spent 40 minutes.  Greater than 50% spent face to face counseling and coordinating care.     Subjective: Patient denies fevers, chills, headache, chest pain, dyspnea, nausea, vomiting, diarrhea, abdominal pain, dysuria, hematuria, hematochezia, and melena.   Objective: Vitals:    06/05/19 1953 06/05/19 2124 06/06/19 0632 06/06/19 1355  BP:  (!) 114/56 (!) 114/48 (!) 111/54  Pulse:  73 79  79  Resp:  20 (!) 21 18  Temp:  99 F (37.2 C) 98.7 F (37.1 C) 98.4 F (36.9 C)  TempSrc:  Oral Oral Oral  SpO2: 92% 95% 93% 100%  Weight:      Height:        Intake/Output Summary (Last 24 hours) at 06/06/2019 1705 Last data filed at 06/06/2019 1300 Gross per 24 hour  Intake 150.01 ml  Output 1800 ml  Net -1649.99 ml   Weight change:  Exam:   General:  Pt is alert, follows commands appropriately, not in acute distress  HEENT: No icterus, No thrush, No neck mass, [place]/AT  Cardiovascular: RRR, S1/S2, no rubs, no gallops  Respiratory: bibasilar rales, no wheeze  Abdomen: Soft/+BS, non tender, non distended, no guarding  Extremities: 1+ nonpitting edema, No lymphangitis, No petechiae, No rashes, no synovitis   Data Reviewed: I have personally reviewed following labs and imaging studies Basic Metabolic Panel: Recent Labs  Lab 06/04/19 0919 06/05/19 0958 06/06/19 0556  NA 140 137 138  K 3.9 4.1 4.4  CL 95* 95* 96*  CO2 33* 30 30  GLUCOSE 161* 167* 206*  BUN 41* 46* 51*  CREATININE 4.35* 5.26* 5.70*  CALCIUM 8.5* 8.5* 8.2*   Liver Function Tests: Recent Labs  Lab 06/04/19 0919  AST 22  ALT 10  ALKPHOS 68  BILITOT 0.7  PROT 7.3  ALBUMIN 2.7*   No results for input(s): LIPASE, AMYLASE in the last 168 hours. No results for input(s): AMMONIA in the last 168 hours. Coagulation Profile: Recent Labs  Lab 06/04/19 0919 06/05/19 1048 06/06/19 0556  INR 3.9* 3.5* 2.9*   CBC: Recent Labs  Lab 06/04/19 0919 06/05/19 0958 06/06/19 0556  WBC 3.5* 4.5 4.3  NEUTROABS 2.2  --   --   HGB 9.4* 8.2* 7.9*  HCT 31.2* 27.4* 26.5*  MCV 99.0 99.3 99.6  PLT 72* 68* 65*   Cardiac Enzymes: No results for input(s): CKTOTAL, CKMB, CKMBINDEX, TROPONINI in the last 168 hours. BNP: Invalid input(s): POCBNP CBG: Recent Labs  Lab 06/05/19 1751  06/05/19 2117 06/06/19 0751 06/06/19 1151 06/06/19 1633  GLUCAP 241* 211* 192* 243* 213*   HbA1C: No results for input(s): HGBA1C in the last 72 hours. Urine analysis:    Component Value Date/Time   COLORURINE RED (A) 06/04/2019 1208   APPEARANCEUR TURBID (A) 06/04/2019 1208   APPEARANCEUR Cloudy (A) 06/01/2019 0843   LABSPEC  06/04/2019 1208    TEST NOT REPORTED DUE TO COLOR INTERFERENCE OF URINE PIGMENT   PHURINE  06/04/2019 1208    TEST NOT REPORTED DUE TO COLOR INTERFERENCE OF URINE PIGMENT   GLUCOSEU (A) 06/04/2019 1208    TEST NOT REPORTED DUE TO COLOR INTERFERENCE OF URINE PIGMENT   HGBUR (A) 06/04/2019 1208    TEST NOT REPORTED DUE TO COLOR INTERFERENCE OF URINE PIGMENT   BILIRUBINUR (A) 06/04/2019 1208    TEST NOT REPORTED DUE TO COLOR INTERFERENCE OF URINE PIGMENT   BILIRUBINUR Negative 06/01/2019 0843   KETONESUR (A) 06/04/2019 1208    TEST NOT REPORTED DUE TO COLOR INTERFERENCE OF URINE PIGMENT   PROTEINUR NEGATIVE 06/04/2019 1208   UROBILINOGEN negative 02/22/2015 1427   UROBILINOGEN 0.2 10/17/2010 0951   NITRITE (A) 06/04/2019 1208    TEST NOT REPORTED DUE TO COLOR INTERFERENCE OF URINE PIGMENT   LEUKOCYTESUR (A) 06/04/2019 1208    TEST NOT REPORTED DUE TO COLOR INTERFERENCE OF URINE PIGMENT   Sepsis Labs: @LABRCNTIP (procalcitonin:4,lacticidven:4) ) Recent Results (from the  past 240 hour(s))  Urine culture     Status: Abnormal   Collection Time: 06/01/19  8:43 AM   Specimen: Urine   UR  Result Value Ref Range Status   Urine Culture, Routine Final report (A)  Final   Organism ID, Bacteria Enterococcus faecalis (A)  Final    Comment: 50,000-100,000 colony forming units per mL   Antimicrobial Susceptibility Comment  Final    Comment:       ** S = Susceptible; I = Intermediate; R = Resistant **                    P = Positive; N = Negative             MICS are expressed in micrograms per mL    Antibiotic                 RSLT#1    RSLT#2    RSLT#3     RSLT#4 Ciprofloxacin                  S Levofloxacin                   S Nitrofurantoin                 S Penicillin                     S Tetracycline                   R Vancomycin                     S   Microscopic Examination     Status: Abnormal   Collection Time: 06/01/19  8:43 AM   URINE  Result Value Ref Range Status   WBC, UA >30 (A) 0 - 5 /hpf Final   RBC 3-10 (A) 0 - 2 /hpf Final   Epithelial Cells (non renal) None seen 0 - 10 /hpf Final   Renal Epithel, UA None seen None seen /hpf Final   Bacteria, UA Many (A) None seen/Few Final  Urine culture     Status: None   Collection Time: 06/04/19 12:08 PM   Specimen: Urine, Catheterized  Result Value Ref Range Status   Specimen Description   Final    URINE, CATHETERIZED Performed at [facility], [address], [place], [place] [address]    Special Requests   Final    NONE Performed at [facility], [address] [place], [place] [address]    Culture   Final    NO GROWTH Performed at [facility], [address] [place],  [address]    Report Status 06/06/2019 FINAL  Final     Scheduled Meds:  busPIRone  15 mg Oral BID   calcitRIOL  0.25 mcg Oral Daily   Chlorhexidine Gluconate Cloth  6 each Topical Daily   clomiPHENE  25 mg Oral QODAY   DULoxetine  60 mg Oral Daily   fentaNYL  1 patch Transdermal Q72H   insulin aspart  0-20 Units Subcutaneous TID WC   insulin aspart  0-5 Units Subcutaneous QHS   insulin glargine  45 Units Subcutaneous BID   lamoTRIgine  200 mg Oral BID   levETIRAcetam  1,000 mg Oral BID   loratadine  10 mg Oral Daily   nystatin   Topical BID   pantoprazole  40 mg Oral Daily  polyethylene glycol  17 g Oral Daily   pregabalin  50 mg Oral TID   senna  2 tablet Oral Daily   simvastatin  20 mg Oral QHS   tamsulosin  0.4 mg Oral Daily   Warfarin - Pharmacist Dosing Inpatient   Does not apply q1800   Continuous Infusions:  piperacillin-tazobactam (ZOSYN)   IV 3.375 g (06/06/19 0620)    Procedures/Studies: US Renal  Result Date: 05/11/2019 CLINICAL DATA:  Acute renal injury. EXAM: RENAL / URINARY TRACT ULTRASOUND COMPLETE COMPARISON:  Ultrasound 02/21/2019.  CT 12/28/2018. FINDINGS: Right Kidney: Renal measurements: 14.1 x 6.8 x 8.8 cm = volume: 441.5 mL . Echogenicity within normal limits. 3.1 cm complex right renal cyst. Although this appears smaller than noted on prior study, on today's exam a slightly thickened septation is noted within this cyst. No hydronephrosis visualized. Left Kidney: Not visualized. Atrophic left kidney noted on prior CT of 12/28/2018. Bladder: Appears normal for degree of bladder distention. Other: Exam limited due to patient's body habitus. IMPRESSION: 1. 3.1 cm complex right renal cyst is noted. Although this appears smaller than on prior study, on today's exam with slightly thickened septation is noted within the cyst. Further evaluation with renal MRI should be considered. 2.  Left renal atrophy. Electronically Signed   By: Marcello Moores  Register   On: 05/11/2019 12:29   Dg Chest Port 1 View  Result Date: 05/11/2019 CLINICAL DATA:  53 year old male with decreased urination and hematuria. History of COVID-19. EXAM: PORTABLE CHEST 1 VIEW COMPARISON:  Chest radiograph dated 04/19/2019. FINDINGS: Bilateral peripheral and subpleural airspace densities similar or minimally improved. No new consolidative changes. There is no pleural effusion or pneumothorax. Stable cardiomegaly. No acute osseous pathology. IMPRESSION: Bilateral peripheral and subpleural airspace densities similar or minimally improved compared to the prior radiograph. Electronically Signed   By: Anner Crete M.D.   On: 05/11/2019 14:58   Ct Renal Stone Study  Result Date: 06/04/2019 CLINICAL DATA:  Flank pain. Recurrent urinary tract infections. EXAM: CT ABDOMEN AND PELVIS WITHOUT CONTRAST TECHNIQUE: Multidetector CT imaging of the abdomen and pelvis was performed  following the standard protocol without IV contrast. COMPARISON:  12/28/2018 FINDINGS: Lower chest: Increased patchy areas airspace disease are seen in both lower lungs, right side greater than left. No evidence of pleural effusion. Hepatobiliary: No mass visualized on this unenhanced exam. Mild capsular nodularity and left and caudate lobe hypertrophy again seen, highly suspicious for cirrhosis. Prior cholecystectomy. No evidence of biliary obstruction. Pancreas: No mass or inflammatory process visualized on this unenhanced exam. Spleen: Moderate splenomegaly measuring 18-19 cm in length is stable and consistent with portal venous hypertension. Adrenals/Urinary tract: Chronic atrophy of the left kidney with several small peripherally calcified cyst is unchanged in appearance. Compensatory hypertrophy of the right kidney is again seen. Mild increase in hazy opacity is seen in the right perinephric region. Mild right pelvicaliectasis is seen containing high attenuation fluid suspicious for hemorrhage. No evidence of renal or ureteral calculi. Unremarkable unopacified urinary bladder. Stomach/Bowel: No evidence of obstruction, inflammatory process, or abnormal fluid collections. Vascular/Lymphatic: No pathologically enlarged lymph nodes identified. No evidence of abdominal aortic aneurysm. Aortic atherosclerosis. Reproductive:  No mass or other significant abnormality. Other:  None. Musculoskeletal:  No suspicious bone lesions identified. IMPRESSION: 1. Increased mild right renal pelvicaliectasis and hazy perinephric opacity. No obstructing calculi identified. These findings are nonspecific, but pyelonephritis cannot be excluded. 2. Stable chronic left renal atrophy. 3. Stable findings of hepatic cirrhosis and portal venous hypertension. No  evidence of ascites. 4. Increased patchy areas of airspace disease in both lower lungs, right side greater than left, suspicious for atypical infectious or inflammatory process.  Aortic Atherosclerosis (ICD10-I70.0). Electronically Signed   By: Marlaine Hind M.D.   On: 06/04/2019 10:13    Orson Eva, DO  [facility] Pager [phone]  If 7PM-7AM, please contact night-coverage [email or web address] Password [email or web address] 06/06/2019, 5:05 PM   LOS: 1 day

## 2019-06-06 NOTE — Telephone Encounter (Signed)
Per CMM: Request Reference Number: [id number]. EMGALITY INJ 120MG /ML is approved through 06/28/2020. For further questions, call [phone].   I faxed a notification to the pharmacy. Received a receipt of confirmation.

## 2019-06-07 DIAGNOSIS — D61818 Other pancytopenia: Secondary | ICD-10-CM

## 2019-06-07 DIAGNOSIS — E662 Morbid (severe) obesity with alveolar hypoventilation: Secondary | ICD-10-CM

## 2019-06-07 DIAGNOSIS — G894 Chronic pain syndrome: Secondary | ICD-10-CM

## 2019-06-07 DIAGNOSIS — N10 Acute tubulo-interstitial nephritis: Principal | ICD-10-CM

## 2019-06-07 DIAGNOSIS — R569 Unspecified convulsions: Secondary | ICD-10-CM

## 2019-06-07 DIAGNOSIS — Z7901 Long term (current) use of anticoagulants: Secondary | ICD-10-CM

## 2019-06-07 LAB — PROTIME-INR
INR: 2.1 — ABNORMAL HIGH (ref 0.8–1.2)
Prothrombin Time: 23.7 seconds — ABNORMAL HIGH (ref 11.4–15.2)

## 2019-06-07 LAB — CBC
HCT: 23.9 % — ABNORMAL LOW (ref 39.0–52.0)
Hemoglobin: 7.3 g/dL — ABNORMAL LOW (ref 13.0–17.0)
MCH: 29.9 pg (ref 26.0–34.0)
MCHC: 30.5 g/dL (ref 30.0–36.0)
MCV: 98 fL (ref 80.0–100.0)
Platelets: 65 10*3/uL — ABNORMAL LOW (ref 150–400)
RBC: 2.44 MIL/uL — ABNORMAL LOW (ref 4.22–5.81)
RDW: 13.9 % (ref 11.5–15.5)
WBC: 4 10*3/uL (ref 4.0–10.5)
nRBC: 0 % (ref 0.0–0.2)

## 2019-06-07 LAB — GLUCOSE, CAPILLARY
Glucose-Capillary: 157 mg/dL — ABNORMAL HIGH (ref 70–99)
Glucose-Capillary: 174 mg/dL — ABNORMAL HIGH (ref 70–99)
Glucose-Capillary: 209 mg/dL — ABNORMAL HIGH (ref 70–99)
Glucose-Capillary: 197 mg/dL — ABNORMAL HIGH (ref 70–99)

## 2019-06-07 LAB — RENAL FUNCTION PANEL
Albumin: 2.3 g/dL — ABNORMAL LOW (ref 3.5–5.0)
Anion gap: 13 (ref 5–15)
BUN: 54 mg/dL — ABNORMAL HIGH (ref 6–20)
CO2: 30 mmol/L (ref 22–32)
Calcium: 8.2 mg/dL — ABNORMAL LOW (ref 8.9–10.3)
Chloride: 94 mmol/L — ABNORMAL LOW (ref 98–111)
Creatinine, Ser: 5.56 mg/dL — ABNORMAL HIGH (ref 0.61–1.24)
GFR calc Af Amer: 13 mL/min — ABNORMAL LOW (ref >60)
GFR calc non Af Amer: 11 mL/min — ABNORMAL LOW (ref >60)
Glucose, Bld: 185 mg/dL — ABNORMAL HIGH (ref 70–99)
Phosphorus: 7.1 mg/dL — ABNORMAL HIGH (ref 2.5–4.6)
Potassium: 3.8 mmol/L (ref 3.5–5.1)
Sodium: 137 mmol/L (ref 135–145)

## 2019-06-07 MED ORDER — SODIUM CHLORIDE 0.9 % IV SOLN
510.0000 mg | Freq: Once | INTRAVENOUS | Status: AC
  Administered 2019-06-07: 510 mg via INTRAVENOUS
  Filled 2019-06-07: qty 17

## 2019-06-07 MED ORDER — FINASTERIDE 5 MG PO TABS
5.0000 mg | ORAL_TABLET | Freq: Every day | ORAL | Status: DC
  Administered 2019-06-07 – 2019-06-08 (×2): 5 mg via ORAL
  Filled 2019-06-07 (×2): qty 1

## 2019-06-07 MED ORDER — DARBEPOETIN ALFA 150 MCG/0.3ML IJ SOSY
150.0000 ug | PREFILLED_SYRINGE | INTRAMUSCULAR | Status: DC
  Administered 2019-06-07: 150 ug via SUBCUTANEOUS
  Filled 2019-06-07: qty 0.3

## 2019-06-07 MED ORDER — WARFARIN SODIUM 5 MG PO TABS
5.0000 mg | ORAL_TABLET | Freq: Once | ORAL | Status: AC
  Administered 2019-06-07: 5 mg via ORAL
  Filled 2019-06-07: qty 1

## 2019-06-07 NOTE — Progress Notes (Signed)
ANTICOAGULATION CONSULT NOTE -   Pharmacy Consult for warfarin Indication: history of DVT/PE  Allergies  Allergen Reactions  . Bee Venom Anaphylaxis, Shortness Of Breath and Swelling  . Other Shortness Of Breath    Itching, rash with IVP DYE, iodine, shellfish LATEX  . Shellfish Allergy Nausea And Vomiting and Other (See Comments)    Feels like insides are twisting  . Iodinated Diagnostic Agents     Other reaction(s): RASH  . Iohexol      Code: RASH, Desc: PT WAS ON PREDNISONE FOR GOUT TX. @ TIME OF SCAN AND RECEIVED 50 MG OF BENADRYL IV-ARS 10/08/07   . Iodine Rash  . Latex Rash    Patient Measurements: Height: 6' 3"  (190.5 cm) Weight: (!) 436 lb 1.6 oz (197.8 kg) IBW/kg (Calculated) : 84.5   Vital Signs:    Labs: Recent Labs    06/05/19 0958 06/05/19 1048 06/06/19 0556 06/07/19 0634  HGB 8.2*  --  7.9* 7.3*  HCT 27.4*  --  26.5* 23.9*  PLT 68*  --  65* 65*  LABPROT  --  35.4* 30.0* 23.7*  INR  --  3.5* 2.9* 2.1*  CREATININE 5.26*  --  5.70* 5.56*    Estimated Creatinine Clearance: 28.5 mL/min (A) (by C-G formula based on SCr of 5.56 mg/dL (H)).   Medical History: Past Medical History:  Diagnosis Date  . Anemia   . Anxiety   . Arthritis   . Bipolar 1 disorder ([place])   . Bronchitis    hx of  . Bruises easily   . Chronic kidney disease    decreased left kidney fx  . Chronic pain syndrome 05/11/2012  . Chronic respiratory failure with hypoxia (HCC)    And with hypercapnia  . Diabetes mellitus without complication ([place])   . Diabetic neuropathy ([place])   . DVT (deep venous thrombosis) (HCC)    LLE DVT ~ '12  . Dyspnea    with ambulation  . GERD (gastroesophageal reflux disease)   . History of 2019 novel coronavirus disease (COVID-19)   . HOH (hard of hearing) 2015   has hearing aids  . Mental disorder   . Migraine   . Neuromuscular disorder ([place])   . Obesity hypoventilation syndrome ([place])   . Obstructive sleep apnea    CPAP  . PE (pulmonary  embolism)    bilateral PE ~ '11  . Seizures ([place])   . Thrombocytopenia ([place]) 05/11/2012    Medications:  Medications Prior to Admission  Medication Sig Dispense Refill Last Dose  . amoxicillin-clavulanate (AUGMENTIN) 875-125 MG tablet Take 1 tablet by mouth 2 (two) times daily. 20 tablet 0 06/04/2019 at Unknown time  . busPIRone (BUSPAR) 15 MG tablet TAKE 1 TABLET (15 MG TOTAL) BY MOUTH 2 (TWO) TIMES DAILY. (Patient taking differently: Take 15 mg by mouth 2 (two) times daily. TAKE 1 TABLET (15 MG TOTAL) BY MOUTH 2 (TWO) TIMES DAILY.) 180 tablet 0 06/04/2019 at Unknown time  . calcitRIOL (ROCALTROL) 0.25 MCG capsule Take 0.25 mcg by mouth daily.   06/04/2019 at Unknown time  . cetirizine (ZYRTEC) 10 MG tablet Take 1 tablet (10 mg total) by mouth daily. 30 tablet 11 06/04/2019 at Unknown time  . clomiPHENE (CLOMID) 50 MG tablet Take 0.5 tablets (25 mg total) by mouth every other day. 24 tablet 3 06/04/2019 at Unknown time  . cyclobenzaprine (FLEXERIL) 10 MG tablet TAKE 1/2 TO 1 TABLET BY MOUTH AS NEEDED AT BEDTIME FOR MUSCLE CRAMPS / SPASMS (Patient taking  differently: Take 10 mg by mouth daily as needed. TAKE 1/2 TO 1 TABLET BY MOUTH AS NEEDED AT BEDTIME FOR MUSCLE CRAMPS / SPASMS) 90 tablet 3 06/03/2019 at Unknown time  . docusate sodium (COLACE) 100 MG capsule Take 1 capsule (100 mg total) by mouth 2 (two) times daily. 10 capsule 0 06/04/2019 at Unknown time  . DULoxetine (CYMBALTA) 60 MG capsule TAKE 1 CAPSULE BY MOUTH EVERY DAY (Patient taking differently: Take 60 mg by mouth daily. ) 90 capsule 0 06/04/2019 at Unknown time  . EMGALITY 120 MG/ML SOAJ Inject 120 mg as directed every 30 (thirty) days.    Past Month at Unknown time  . EPIPEN 2-PAK 0.3 MG/0.3ML SOAJ injection INJECT 0.3 MLS (0.3 MG TOTAL) INTO THE MUSCLE ONCE. AS NEEDED FOR ANAPHYLACTIC REACTION 2 Device 2   . esomeprazole (NEXIUM) 40 MG capsule TAKE 1 CAPSULE (40 MG TOTAL) BY MOUTH DAILY AT 12 NOON. 90 capsule 0 06/04/2019 at Unknown time   . fentaNYL (DURAGESIC) 75 MCG/HR Place 1 patch onto the skin every 3 (three) days. 10 patch 0 06/02/2019 at Unknown time  . fluticasone (FLONASE) 50 MCG/ACT nasal spray PLACE 1 SPRAY INTO BOTH NOSTRILS 2 (TWO) TIMES DAILY AS NEEDED FOR ALLERGIES. 48 g 4 06/03/2019 at Unknown time  . furosemide (LASIX) 80 MG tablet Take 1 tablet (80 mg total) by mouth 2 (two) times daily. 180 tablet 1 06/04/2019 at Unknown time  . HYDROcodone-acetaminophen (NORCO) 7.5-325 MG tablet Take 1 tablet by mouth every 6 (six) hours as needed for moderate pain. 90 tablet 0 06/04/2019 at Unknown time  . Insulin Glargine, 1 Unit Dial, (TOUJEO SOLOSTAR) 300 UNIT/ML SOPN Inject 60 Units into the skin 2 (two) times daily. (Patient taking differently: Inject 200 Units into the skin daily. ) 130 pen 3 06/04/2019 at Unknown time  . insulin lispro (HUMALOG) 100 UNIT/ML KwikPen Inject 0.25 mLs (25 Units total) into the skin 3 (three) times daily. (Patient taking differently: Inject 50 Units into the skin 3 (three) times daily. ) 45 mL 0 06/04/2019 at Unknown time  . JARDIANCE 25 MG TABS tablet Take 25 mg by mouth daily.    06/04/2019 at Unknown time  . KLOR-CON M10 10 MEQ tablet TAKE 3 TABLETS (30 MEQ TOTAL) BY MOUTH DAILY. (Patient taking differently: Take 30 mEq by mouth daily. ) 270 tablet 0 06/04/2019 at Unknown time  . lamoTRIgine (LAMICTAL) 200 MG tablet TAKE 1 TABLET BY MOUTH TWICE A DAY (Patient taking differently: Take 200 mg by mouth 2 (two) times daily. ) 180 tablet 0 06/04/2019 at Unknown time  . levETIRAcetam (KEPPRA) 1000 MG tablet Take 1 tablet (1,000 mg total) by mouth 2 (two) times daily. 180 tablet 3 06/04/2019 at Unknown time  . NARCAN 4 MG/0.1ML LIQD nasal spray kit SMARTSIG:1 Spray(s) Both Nares As Needed     . pregabalin (LYRICA) 50 MG capsule TAKE 1 CAPSULE BY MOUTH THREE TIMES A DAY (Patient taking differently: Take 50 mg by mouth 3 (three) times daily. TAKE 1 CAPSULE BY MOUTH THREE TIMES A DAY) 270 capsule 1 06/04/2019 at  Unknown time  . PROAIR HFA 108 (90 Base) MCG/ACT inhaler TAKE 2 PUFFS BY MOUTH EVERY 6 HOURS AS NEEDED FOR WHEEZE OR SHORTNESS OF BREATH 18 g 0 06/03/2019 at Unknown time  . rizatriptan (MAXALT) 10 MG tablet May repeat in 2 hours if needed (Patient taking differently: Take 10 mg by mouth as needed. May repeat in 2 hours if needed) 10 tablet 11   .  simvastatin (ZOCOR) 20 MG tablet Take 1 tablet (20 mg total) by mouth at bedtime. 90 tablet 3 06/03/2019 at Unknown time  . tamsulosin (FLOMAX) 0.4 MG CAPS capsule TAKE 1 CAPSULE BY MOUTH EVERY DAY (Patient taking differently: Take 0.4 mg by mouth daily. TAKE 1 CAPSULE BY MOUTH EVERY DAY) 90 capsule 0 06/04/2019 at Unknown time  . TRULICITY 1.5 NW/2.9FA SOPN Inject 1.5 mg as directed once a week.    Past Week at Unknown time  . vitamin C (VITAMIN C) 500 MG tablet Take 1 tablet (500 mg total) by mouth daily.   06/04/2019 at Unknown time  . Vitamin D, Ergocalciferol, (DRISDOL) 1.25 MG (50000 UT) CAPS capsule Take 50,000 Units by mouth once a week.   Past Week at Unknown time  . warfarin (COUMADIN) 5 MG tablet Take 1 tablet (5 mg total) by mouth daily. Take 17m daily 90 tablet 2 06/03/2019 at 1800  . zinc sulfate 220 (50 Zn) MG capsule Take 1 capsule (220 mg total) by mouth 2 (two) times daily.   06/04/2019 at Unknown time  . warfarin (COUMADIN) 3 MG tablet Take 1 tablet (3 mg total) by mouth one time only at 6 PM. (Patient not taking: Reported on 06/04/2019) 30 tablet 0 Not Taking at 1800    Assessment: Pharmacy consulted to dose warfarin in patient with history of DVT/PE.  Home warfarin dose listed as 5 mg daily with last dose on 12/5 1800. hgb 8.2>> 7.9>>7.3  Nephology ordering feraheme INR 2.1, restart coumadin today  Goal of Therapy:  INR 2-3 Monitor platelets by anticoagulation protocol: Yes   Plan:  Coumadin 569mpo x 1 Monitor daily INR and s/s of bleeding.  LoIsac SarnaBS Pharm D, BCPS Clinical Pharmacist Pager #[phone]/02/2019,10:43  AM

## 2019-06-07 NOTE — Progress Notes (Signed)
  Subjective: Patient reports no suprapubic pain. Urine light red today.   Objective: Vital signs in last 24 hours: Temp:  [98.4 F (36.9 C)-98.6 F (37 C)] 98.4 F (36.9 C) (12/09 1434) Pulse Rate:  [77-80] 80 (12/09 1434) Resp:  [20] 20 (12/09 1434) BP: (118-121)/(60-70) 121/70 (12/09 1434) SpO2:  [97 %-100 %] 98 % (12/09 1434)  Intake/Output from previous day: 12/08 0701 - 12/09 0700 In: -  Out: 800 [Urine:800] Intake/Output this shift: Total I/O In: 600 [P.O.:600] Out: 2400 [Urine:2400]  Physical Exam:  General:alert, cooperative and appears stated age GI: soft, non tender, normal bowel sounds, no palpable masses, no organomegaly, no inguinal hernia Male genitalia: not done Extremities: extremities normal, atraumatic, no cyanosis or edema  Lab Results: Recent Labs    06/05/19 0958 06/06/19 0556 06/07/19 0634  HGB 8.2* 7.9* 7.3*  HCT 27.4* 26.5* 23.9*   BMET Recent Labs    06/06/19 0556 06/07/19 0634  NA 138 137  K 4.4 3.8  CL 96* 94*  CO2 30 30  GLUCOSE 206* 185*  BUN 51* 54*  CREATININE 5.70* 5.56*  CALCIUM 8.2* 8.2*   Recent Labs    06/05/19 1048 06/06/19 0556 06/07/19 0634  INR 3.5* 2.9* 2.1*   No results for input(s): LABURIN in the last 72 hours. Results for orders placed or performed during the hospital encounter of 06/04/19  Urine culture     Status: None   Collection Time: 06/04/19 12:08 PM   Specimen: Urine, Catheterized  Result Value Ref Range Status   Specimen Description   Final    URINE, CATHETERIZED Performed at [facility], [address] [place], [place] [address]    Special Requests   Final    NONE Performed at [facility], [address] [place], [place] [address]    Culture   Final    NO GROWTH Performed at [facility], [place] [address] [place], [place] [address]    Report Status 06/06/2019 FINAL  Final    Studies/Results: No results found.  Assessment/Plan: 53yo with UTI and gross hematuria  1.  Please continue broad spectrum antibiotics pending urine culture. Please treat for 7 days total 2. Gross hematuria: likely related to UTI and elevated PVR. We will continue flomax and start finasteride 5mg  daily   LOS: 2 days   Nicolette Bang 06/07/2019, 3:21 PM

## 2019-06-07 NOTE — Progress Notes (Signed)
Patient requires frequent re-positioning of the body in ways that cannot be achieved with an ordinary bed or wedge pillow, to eliminate pain and the head of the bed needs to be elevated more than 30 degrees most of the time.  [facility]  Clinical Social Worker  Ph: [phone]

## 2019-06-07 NOTE — Progress Notes (Addendum)
[facility] Progress Note    Assessment/ Plan:   # AKI on CKV IV; last cr 11/16 was 4.16- has essentially solitary kidney.  No frank stone disease on CT scan 12/6 but has high attentuation area suspicious for blood.  Lasix and Jardiance stopped for now.  Appreciate urology consult- expectant management for now, if has clinical decomp repeat imaging should be considered.  Follows with Dr. Moshe Cipro as an outpatient.  No need for RRT today.   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 Tsat 16%, will give feraheme and ESA today  Subjective:    Foley placed yesterday.  Mild improvement in Cr.  No uremic symptoms today. Only 800 mL urine charted yesterday   Objective:   BP 118/60 (BP Location: Right Arm)   Pulse 77   Temp 98.6 F (37 C) (Oral)   Resp 20   Ht 6\' 3"  (1.905 m)   Wt (!) 197.8 kg   SpO2 97%   BMI 54.51 kg/m   Intake/Output Summary (Last 24 hours) at 06/07/2019 1009 Last data filed at 06/07/2019 0900 Gross per 24 hour  Intake 240 ml  Output 800 ml  Net -560 ml   Weight change:   Physical Exam: Gen: obese, NAD, lying in bed CVS: RRR no m/r/g Resp: On O2, clear anteriorly, hard to ascultate posteriorly Abd: obese, nontender GU: Foley with rosy colored urine Ext: trace LE edema with venous stasis changes  Imaging: No results found.  Labs: BMET Recent Labs  Lab 06/04/19 0919 06/05/19 0958 06/06/19 0556 06/07/19 0634  NA 140 137 138 137  K 3.9 4.1 4.4 3.8  CL 95* 95* 96* 94*  CO2 33* 30 30 30   GLUCOSE 161* 167* 206* 185*  BUN 41* 46* 51* 54*   CREATININE 4.35* 5.26* 5.70* 5.56*  CALCIUM 8.5* 8.5* 8.2* 8.2*  PHOS  --   --   --  7.1*   CBC Recent Labs  Lab 06/04/19 0919 06/05/19 0958 06/06/19 0556 06/07/19 0634  WBC 3.5* 4.5 4.3 4.0  NEUTROABS 2.2  --   --   --   HGB 9.4* 8.2* 7.9* 7.3*  HCT 31.2* 27.4* 26.5* 23.9*  MCV 99.0 99.3 99.6 98.0  PLT 72* 68* 65* 65*    Medications:    . busPIRone  15 mg Oral BID  . calcitRIOL  0.25 mcg Oral Daily  . Chlorhexidine Gluconate Cloth  6 each Topical Daily  . clomiPHENE  25 mg Oral QODAY  . DULoxetine  60 mg Oral Daily  . fentaNYL  1 patch Transdermal Q72H  . insulin aspart  0-20 Units Subcutaneous TID WC  . insulin aspart  0-5 Units Subcutaneous QHS  . insulin glargine  45 Units Subcutaneous BID  . lamoTRIgine  200 mg Oral BID  . levETIRAcetam  1,000 mg Oral BID  . loratadine  10 mg Oral Daily  . nystatin   Topical BID  . pantoprazole  40 mg Oral Daily  . polyethylene glycol  17 g Oral Daily  . pregabalin  50 mg Oral TID  . senna  2 tablet Oral Daily  .  simvastatin  20 mg Oral QHS  . tamsulosin  0.4 mg Oral Daily  . Warfarin - Pharmacist Dosing Inpatient   Does not apply q1800      Madelon Lips, MD 06/07/2019, 10:09 AM

## 2019-06-07 NOTE — Progress Notes (Signed)
PROGRESS NOTE  [name] [id number] DOB: 03/07/1966 DOA: 06/04/2019 PCP: Sharion Balloon, FNP  Brief History:  53 y.o.malewith medical history ofCKD stage IV, bipolar disorder, chronic respiratory failure on 3-4 L, diabetes mellitus type 2, DVT, OSA/OHS, pulmonary embolus, seizure disorder, thrombocytopenia, hyperlipidemia, chronic pain syndrome presenting with 2 days of dysuria and hematuria.  Apparently, the patient called his primary care provider on 06/01/2019 with dysuria and hematuria.  Urine culture was sent, and the patient was started on Augmentin.  His symptoms have persisted.  In addition, the patient had complained of left-sided flank pain.  He denied any fevers, chills, chest pain, nausea, vomiting, diarrhea. The patient has had 2 recent hospitalizations, most recently 05/09/2019 through 11/162010 which time he was treated for sepsis secondary to Enterococcus faecalis UTI and acute on chronic renal failure.  He was given a penicillin challenge and passed.  He was discharged home with amoxicillin.  During the hospitalization he received 1 unit PRBC.  The patient was also recently hospitalized from 04/09/2019 to 04/25/2019 for acute on chronic respiratory failure secondary to COVID-19 pneumonia. In the emergency department, the patient had low-grade temperature of 99.0 F but was hemodynamically stable.  Oxygen saturation was 94-96% on 3 L.  Initial presentation and showed serum creatinine 4.35 with potassium 3.9.  WBC 3.5, hemoglobin 9.4, platelets 72,000.  Assessment/Plan: Pyuria/hematuria/Pyelonephritis -06/04/19 CT abd/pelvis--increase mild R-pelvicaliectasis with hazy perinephric stranding -continue zosyn--12/6 urine culture neg, but 12/3 urine = Efaecalis; plan is for 7 days treatment. -urology consult appreciated-->continue nonoperative management -Started on Flomax and finasteride. -12/8--hematuria improving when comparing pictures of hematuria from  home -baseline Hgb 8-9 -Continue to follow hematuria response after restarting Coumadin.  Acute on chronic renal failure--CKD4 -wide variations in baseline but ranges from 3.5-4.2 -Suspect the patient has progression of his underlying CKD -Continue holding furosemide due to increasing serum creatinine -appreciate nephrology consult--discussed with Dr. Hollie Salk on 06/07/2019.  Uncontrolled/poorly controlled diabetes mellitus type 2, with hyperglycemia -Continue reduced dose Lantus -NovoLog sliding scale -04/10/2019 hemoglobin A1c 7.9 -Continue holding Jardiance and Trulicity while inpatient.  Chronic Respiratory Failure with Hypoxia -secondary to OSA/OHS -stable on 3-4L which is his home demand -Currently appears to be on his baseline oxygen -not on CPAP at home  History of DVT/PE -Restart Coumadin per pharmacy and follow hematuria.  Bipolar disorder -Continue Lamictal and BuSpar and Cymbalta  Chronic pain syndrome -Continue home dose of fentanyl and hydrocodone -Continue Lyrica  Morbid obesity -Lifestyle modification -BMI 53.12  Dyslipidemia -Continue statin  Seizure disorder -Continue Keppra  Thrombocytopenia/Pancytopenia -This has been chronic secondary to chronic hepatic congestion from OSA/OHS and hepatic cirrhosis -patient received 1u PRBC on 05/13/2019 -Continue to follow hemoglobin trend. -No transfusion needed at this point -Per nephrology service recommendations will give IV Feraheme and ASA.   Disposition Plan:   Home in 2-3 days  Family Communication:   Updated spouse 12/8  Consultants:  none  Code Status:  FULL   DVT Prophylaxis:  warfarin   Procedures: As Listed in Progress Note Above  Antibiotics: Ceftriaxone 12/6 Zosyn 12/7>>>   Subjective: Patient is afebrile, denies chest pain, no nausea, no vomiting.  Reports increasing his urine output and also expressed urine to be more clear; he is more interactive and per wife at  bedside appears to have improving his mentation.   Objective: Vitals:   06/06/19 1355 06/06/19 2039 06/06/19 2103 06/07/19 1434  BP: (!) 111/54  118/60 121/70  Pulse:  79  77 80  Resp: 18  20 20   Temp: 98.4 F (36.9 C)  98.6 F (37 C) 98.4 F (36.9 C)  TempSrc: Oral  Oral Oral  SpO2: 100% 100% 97% 98%  Weight:      Height:        Intake/Output Summary (Last 24 hours) at 06/07/2019 1615 Last data filed at 06/07/2019 1300 Gross per 24 hour  Intake 600 ml  Output 2400 ml  Net -1800 ml   Weight change:   Exam: General exam: Alert, awake, oriented x 3; afebrile, no chest pain, no nausea, no vomiting.  More interactive and expressing increased urine output. Respiratory system: Good air movement bilaterally; positive rhonchi right, no wheezing.  No using accessory muscles.  Cardiovascular system:RRR. No murmurs, rubs, gallops. Gastrointestinal system: Abdomen is nondistended, soft and nontender. No organomegaly or masses felt. Normal bowel sounds heard. Central nervous system: Alert and oriented. No focal neurological deficits. Extremities: No cyanosis or clubbing; 1+ edema bilaterally appreciated.  Skin: No rashes, lesions or ulcers Psychiatry: Judgement and insight appear normal. Mood & affect appropriate.    Data Reviewed: I have personally reviewed following labs and imaging studies Basic Metabolic Panel: Recent Labs  Lab 06/04/19 0919 06/05/19 0958 06/06/19 0556 06/07/19 0634  NA 140 137 138 137  K 3.9 4.1 4.4 3.8  CL 95* 95* 96* 94*  CO2 33* 30 30 30   GLUCOSE 161* 167* 206* 185*  BUN 41* 46* 51* 54*  CREATININE 4.35* 5.26* 5.70* 5.56*  CALCIUM 8.5* 8.5* 8.2* 8.2*  PHOS  --   --   --  7.1*   Liver Function Tests: Recent Labs  Lab 06/04/19 0919 06/07/19 0634  AST 22  --   ALT 10  --   ALKPHOS 68  --   BILITOT 0.7  --   PROT 7.3  --   ALBUMIN 2.7* 2.3*   Coagulation Profile: Recent Labs  Lab 06/04/19 0919 06/05/19 1048 06/06/19 0556 06/07/19 0634    INR 3.9* 3.5* 2.9* 2.1*   CBC: Recent Labs  Lab 06/04/19 0919 06/05/19 0958 06/06/19 0556 06/07/19 0634  WBC 3.5* 4.5 4.3 4.0  NEUTROABS 2.2  --   --   --   HGB 9.4* 8.2* 7.9* 7.3*  HCT 31.2* 27.4* 26.5* 23.9*  MCV 99.0 99.3 99.6 98.0  PLT 72* 68* 65* 65*   BNP: Invalid input(s): POCBNP   CBG: Recent Labs  Lab 06/06/19 1633 06/06/19 2059 06/07/19 0737 06/07/19 1108 06/07/19 1612  GLUCAP 213* 237* 157* 174* 209*   HbA1C: No results for input(s): HGBA1C in the last 72 hours.   Urine analysis:    Component Value Date/Time   COLORURINE RED (A) 06/04/2019 1208   APPEARANCEUR TURBID (A) 06/04/2019 1208   APPEARANCEUR Cloudy (A) 06/01/2019 0843   LABSPEC  06/04/2019 1208    TEST NOT REPORTED DUE TO COLOR INTERFERENCE OF URINE PIGMENT   PHURINE  06/04/2019 1208    TEST NOT REPORTED DUE TO COLOR INTERFERENCE OF URINE PIGMENT   GLUCOSEU (A) 06/04/2019 1208    TEST NOT REPORTED DUE TO COLOR INTERFERENCE OF URINE PIGMENT   HGBUR (A) 06/04/2019 1208    TEST NOT REPORTED DUE TO COLOR INTERFERENCE OF URINE PIGMENT   BILIRUBINUR (A) 06/04/2019 1208    TEST NOT REPORTED DUE TO COLOR INTERFERENCE OF URINE PIGMENT   BILIRUBINUR Negative 06/01/2019 0843   KETONESUR (A) 06/04/2019 1208    TEST NOT REPORTED DUE TO COLOR INTERFERENCE OF URINE PIGMENT  PROTEINUR NEGATIVE 06/04/2019 1208   UROBILINOGEN negative 02/22/2015 1427   UROBILINOGEN 0.2 10/17/2010 0951   NITRITE (A) 06/04/2019 1208    TEST NOT REPORTED DUE TO COLOR INTERFERENCE OF URINE PIGMENT   LEUKOCYTESUR (A) 06/04/2019 1208    TEST NOT REPORTED DUE TO COLOR INTERFERENCE OF URINE PIGMENT    Recent Results (from the past 240 hour(s))  Urine culture     Status: Abnormal   Collection Time: 06/01/19  8:43 AM   Specimen: Urine   UR  Result Value Ref Range Status   Urine Culture, Routine Final report (A)  Final   Organism ID, Bacteria Enterococcus faecalis (A)  Final    Comment: 50,000-100,000 colony forming units  per mL   Antimicrobial Susceptibility Comment  Final    Comment:       ** S = Susceptible; I = Intermediate; R = Resistant **                    P = Positive; N = Negative             MICS are expressed in micrograms per mL    Antibiotic                 RSLT#1    RSLT#2    RSLT#3    RSLT#4 Ciprofloxacin                  S Levofloxacin                   S Nitrofurantoin                 S Penicillin                     S Tetracycline                   R Vancomycin                     S   Microscopic Examination     Status: Abnormal   Collection Time: 06/01/19  8:43 AM   URINE  Result Value Ref Range Status   WBC, UA >30 (A) 0 - 5 /hpf Final   RBC 3-10 (A) 0 - 2 /hpf Final   Epithelial Cells (non renal) None seen 0 - 10 /hpf Final   Renal Epithel, UA None seen None seen /hpf Final   Bacteria, UA Many (A) None seen/Few Final  Urine culture     Status: None   Collection Time: 06/04/19 12:08 PM   Specimen: Urine, Catheterized  Result Value Ref Range Status   Specimen Description   Final    URINE, CATHETERIZED Performed at [facility], [address] [place], [place] [address]    Special Requests   Final    NONE Performed at [facility], [address], [place], [place] [address]    Culture   Final    NO GROWTH Performed at [facility], [address] [place], [place] [address]    Report Status 06/06/2019 FINAL  Final     Scheduled Meds:  busPIRone  15 mg Oral BID   calcitRIOL  0.25 mcg Oral Daily   Chlorhexidine Gluconate Cloth  6 each Topical Daily   clomiPHENE  25 mg Oral QODAY   darbepoetin (ARANESP) injection - NON-DIALYSIS  150 mcg Subcutaneous Q Wed-1800   DULoxetine  60 mg Oral Daily  fentaNYL  1 patch Transdermal Q72H   finasteride  5 mg Oral Daily   insulin aspart  0-20 Units Subcutaneous TID WC   insulin aspart  0-5 Units Subcutaneous QHS   insulin glargine  45 Units Subcutaneous BID   lamoTRIgine  200 mg Oral BID   levETIRAcetam  1,000  mg Oral BID   loratadine  10 mg Oral Daily   nystatin   Topical BID   pantoprazole  40 mg Oral Daily   polyethylene glycol  17 g Oral Daily   pregabalin  50 mg Oral TID   senna  2 tablet Oral Daily   simvastatin  20 mg Oral QHS   tamsulosin  0.4 mg Oral Daily   warfarin  5 mg Oral Once   Warfarin - Pharmacist Dosing Inpatient   Does not apply q1800   Continuous Infusions:  piperacillin-tazobactam (ZOSYN)  IV 3.375 g (06/07/19 1445)    Procedures/Studies: US Renal  Result Date: 05/11/2019 CLINICAL DATA:  Acute renal injury. EXAM: RENAL / URINARY TRACT ULTRASOUND COMPLETE COMPARISON:  Ultrasound 02/21/2019.  CT 12/28/2018. FINDINGS: Right Kidney: Renal measurements: 14.1 x 6.8 x 8.8 cm = volume: 441.5 mL . Echogenicity within normal limits. 3.1 cm complex right renal cyst. Although this appears smaller than noted on prior study, on today's exam a slightly thickened septation is noted within this cyst. No hydronephrosis visualized. Left Kidney: Not visualized. Atrophic left kidney noted on prior CT of 12/28/2018. Bladder: Appears normal for degree of bladder distention. Other: Exam limited due to patient's body habitus. IMPRESSION: 1. 3.1 cm complex right renal cyst is noted. Although this appears smaller than on prior study, on today's exam with slightly thickened septation is noted within the cyst. Further evaluation with renal MRI should be considered. 2.  Left renal atrophy. Electronically Signed   By: Marcello Moores  Register   On: 05/11/2019 12:29   Dg Chest Port 1 View  Result Date: 05/11/2019 CLINICAL DATA:  53 year old male with decreased urination and hematuria. History of COVID-19. EXAM: PORTABLE CHEST 1 VIEW COMPARISON:  Chest radiograph dated 04/19/2019. FINDINGS: Bilateral peripheral and subpleural airspace densities similar or minimally improved. No new consolidative changes. There is no pleural effusion or pneumothorax. Stable cardiomegaly. No acute osseous pathology.  IMPRESSION: Bilateral peripheral and subpleural airspace densities similar or minimally improved compared to the prior radiograph. Electronically Signed   By: Anner Crete M.D.   On: 05/11/2019 14:58   Ct Renal Stone Study  Result Date: 06/04/2019 CLINICAL DATA:  Flank pain. Recurrent urinary tract infections. EXAM: CT ABDOMEN AND PELVIS WITHOUT CONTRAST TECHNIQUE: Multidetector CT imaging of the abdomen and pelvis was performed following the standard protocol without IV contrast. COMPARISON:  12/28/2018 FINDINGS: Lower chest: Increased patchy areas airspace disease are seen in both lower lungs, right side greater than left. No evidence of pleural effusion. Hepatobiliary: No mass visualized on this unenhanced exam. Mild capsular nodularity and left and caudate lobe hypertrophy again seen, highly suspicious for cirrhosis. Prior cholecystectomy. No evidence of biliary obstruction. Pancreas: No mass or inflammatory process visualized on this unenhanced exam. Spleen: Moderate splenomegaly measuring 18-19 cm in length is stable and consistent with portal venous hypertension. Adrenals/Urinary tract: Chronic atrophy of the left kidney with several small peripherally calcified cyst is unchanged in appearance. Compensatory hypertrophy of the right kidney is again seen. Mild increase in hazy opacity is seen in the right perinephric region. Mild right pelvicaliectasis is seen containing high attenuation fluid suspicious for hemorrhage. No evidence of renal or  ureteral calculi. Unremarkable unopacified urinary bladder. Stomach/Bowel: No evidence of obstruction, inflammatory process, or abnormal fluid collections. Vascular/Lymphatic: No pathologically enlarged lymph nodes identified. No evidence of abdominal aortic aneurysm. Aortic atherosclerosis. Reproductive:  No mass or other significant abnormality. Other:  None. Musculoskeletal:  No suspicious bone lesions identified. IMPRESSION: 1. Increased mild right renal  pelvicaliectasis and hazy perinephric opacity. No obstructing calculi identified. These findings are nonspecific, but pyelonephritis cannot be excluded. 2. Stable chronic left renal atrophy. 3. Stable findings of hepatic cirrhosis and portal venous hypertension. No evidence of ascites. 4. Increased patchy areas of airspace disease in both lower lungs, right side greater than left, suspicious for atypical infectious or inflammatory process. Aortic Atherosclerosis (ICD10-I70.0). Electronically Signed   By: Marlaine Hind M.D.   On: 06/04/2019 10:13    Barton Dubois, MD  [facility] Pager [phone]  06/07/2019, 4:15 PM   LOS: 2 days

## 2019-06-08 ENCOUNTER — Inpatient Hospital Stay (HOSPITAL_COMMUNITY): Payer: Medicare Other

## 2019-06-08 DIAGNOSIS — Z23 Encounter for immunization: Secondary | ICD-10-CM | POA: Diagnosis not present

## 2019-06-08 LAB — CBC
HCT: 23.8 % — ABNORMAL LOW (ref 39.0–52.0)
HCT: 23 % — ABNORMAL LOW (ref 39.0–52.0)
Hemoglobin: 7.1 g/dL — ABNORMAL LOW (ref 13.0–17.0)
Hemoglobin: 7.1 g/dL — ABNORMAL LOW (ref 13.0–17.0)
MCH: 29.6 pg (ref 26.0–34.0)
MCH: 30.1 pg (ref 26.0–34.0)
MCHC: 29.8 g/dL — ABNORMAL LOW (ref 30.0–36.0)
MCHC: 30.9 g/dL (ref 30.0–36.0)
MCV: 99.2 fL (ref 80.0–100.0)
MCV: 97.5 fL (ref 80.0–100.0)
Platelets: 66 10*3/uL — ABNORMAL LOW (ref 150–400)
Platelets: 70 10*3/uL — ABNORMAL LOW (ref 150–400)
RBC: 2.4 MIL/uL — ABNORMAL LOW (ref 4.22–5.81)
RBC: 2.36 MIL/uL — ABNORMAL LOW (ref 4.22–5.81)
RDW: 13.9 % (ref 11.5–15.5)
RDW: 14.1 % (ref 11.5–15.5)
WBC: 3 10*3/uL — ABNORMAL LOW (ref 4.0–10.5)
WBC: 3 10*3/uL — ABNORMAL LOW (ref 4.0–10.5)
nRBC: 0 % (ref 0.0–0.2)
nRBC: 0 % (ref 0.0–0.2)

## 2019-06-08 LAB — RENAL FUNCTION PANEL
Albumin: 2.3 g/dL — ABNORMAL LOW (ref 3.5–5.0)
Albumin: 2.3 g/dL — ABNORMAL LOW (ref 3.5–5.0)
Anion gap: 13 (ref 5–15)
Anion gap: 13 (ref 5–15)
BUN: 57 mg/dL — ABNORMAL HIGH (ref 6–20)
BUN: 54 mg/dL — ABNORMAL HIGH (ref 6–20)
CO2: 29 mmol/L (ref 22–32)
CO2: 29 mmol/L (ref 22–32)
Calcium: 8.2 mg/dL — ABNORMAL LOW (ref 8.9–10.3)
Calcium: 8.3 mg/dL — ABNORMAL LOW (ref 8.9–10.3)
Chloride: 95 mmol/L — ABNORMAL LOW (ref 98–111)
Chloride: 96 mmol/L — ABNORMAL LOW (ref 98–111)
Creatinine, Ser: 5.85 mg/dL — ABNORMAL HIGH (ref 0.61–1.24)
Creatinine, Ser: 5.45 mg/dL — ABNORMAL HIGH (ref 0.61–1.24)
GFR calc Af Amer: 12 mL/min — ABNORMAL LOW (ref >60)
GFR calc Af Amer: 13 mL/min — ABNORMAL LOW (ref >60)
GFR calc non Af Amer: 10 mL/min — ABNORMAL LOW (ref >60)
GFR calc non Af Amer: 11 mL/min — ABNORMAL LOW (ref >60)
Glucose, Bld: 194 mg/dL — ABNORMAL HIGH (ref 70–99)
Glucose, Bld: 159 mg/dL — ABNORMAL HIGH (ref 70–99)
Phosphorus: 6.3 mg/dL — ABNORMAL HIGH (ref 2.5–4.6)
Phosphorus: 5.9 mg/dL — ABNORMAL HIGH (ref 2.5–4.6)
Potassium: 3.8 mmol/L (ref 3.5–5.1)
Potassium: 3.6 mmol/L (ref 3.5–5.1)
Sodium: 137 mmol/L (ref 135–145)
Sodium: 138 mmol/L (ref 135–145)

## 2019-06-08 LAB — GLUCOSE, CAPILLARY
Glucose-Capillary: 155 mg/dL — ABNORMAL HIGH (ref 70–99)
Glucose-Capillary: 162 mg/dL — ABNORMAL HIGH (ref 70–99)
Glucose-Capillary: 154 mg/dL — ABNORMAL HIGH (ref 70–99)

## 2019-06-08 LAB — PROTIME-INR
INR: 1.8 — ABNORMAL HIGH (ref 0.8–1.2)
Prothrombin Time: 20.7 seconds — ABNORMAL HIGH (ref 11.4–15.2)

## 2019-06-08 MED ORDER — FUROSEMIDE 80 MG PO TABS: 80.0000 mg | ORAL_TABLET | Freq: Two times a day (BID) | ORAL | Status: DC

## 2019-06-08 MED ORDER — AMOXICILLIN-POT CLAVULANATE 500-125 MG PO TABS: 1.0000 | ORAL_TABLET | Freq: Two times a day (BID) | ORAL | 0 refills | Status: DC

## 2019-06-08 MED ORDER — PREGABALIN 50 MG PO CAPS: 50.0000 mg | ORAL_CAPSULE | Freq: Every day | ORAL | Status: DC

## 2019-06-08 MED ORDER — FINASTERIDE 5 MG PO TABS: 5.0000 mg | ORAL_TABLET | Freq: Every day | ORAL | 1 refills | Status: DC

## 2019-06-08 MED ORDER — WARFARIN SODIUM 5 MG PO TABS
5.0000 mg | ORAL_TABLET | Freq: Once | ORAL | Status: AC
  Administered 2019-06-08: 5 mg via ORAL
  Filled 2019-06-08: qty 1

## 2019-06-08 NOTE — Plan of Care (Signed)
  Problem: Education: Goal: Knowledge of General Education information will improve Description Including pain rating scale, medication(s)/side effects and non-pharmacologic comfort measures Outcome: Progressing   Problem: Health Behavior/Discharge Planning: Goal: Ability to manage health-related needs will improve Outcome: Progressing   

## 2019-06-08 NOTE — Progress Notes (Signed)
Dr. Had ordered a kidney study for patient before d/c. He refused and said he would schedule it outpatient.

## 2019-06-08 NOTE — Telephone Encounter (Signed)
Patient admitted to [facility] 06/04/2019

## 2019-06-08 NOTE — Plan of Care (Signed)
  Problem: Education: Goal: Knowledge of General Education information will improve Description: Including pain rating scale, medication(s)/side effects and non-pharmacologic comfort measures 06/08/2019 2008 by Santa Lighter, RN Outcome: Adequate for Discharge 06/08/2019 1423 by Santa Lighter, RN Outcome: Progressing   Problem: Health Behavior/Discharge Planning: Goal: Ability to manage health-related needs will improve 06/08/2019 2008 by Santa Lighter, RN Outcome: Adequate for Discharge 06/08/2019 1423 by Santa Lighter, RN Outcome: Progressing   Problem: Clinical Measurements: Goal: Ability to maintain clinical measurements within normal limits will improve Outcome: Adequate for Discharge Goal: Will remain free from infection Outcome: Adequate for Discharge Goal: Diagnostic test results will improve Outcome: Adequate for Discharge Goal: Respiratory complications will improve Outcome: Adequate for Discharge Goal: Cardiovascular complication will be avoided Outcome: Adequate for Discharge   Problem: Nutrition: Goal: Adequate nutrition will be maintained Outcome: Adequate for Discharge   Problem: Coping: Goal: Level of anxiety will decrease Outcome: Adequate for Discharge   Problem: Elimination: Goal: Will not experience complications related to bowel motility Outcome: Adequate for Discharge Goal: Will not experience complications related to urinary retention Outcome: Adequate for Discharge   Problem: Pain Managment: Goal: General experience of comfort will improve Outcome: Adequate for Discharge   Problem: Skin Integrity: Goal: Risk for impaired skin integrity will decrease Outcome: Adequate for Discharge

## 2019-06-08 NOTE — Progress Notes (Addendum)
[facility] Progress Note    Assessment/ Plan:   # AKI on CKV IV; last cr 11/16 was 4.16- has essentially solitary kidney.  No frank stone disease on CT scan 12/6 but has high attentuation area suspicious for blood.  Lasix and Jardiance stopped for now.  Appreciate urology consult- expectant management for now, if has clinical decomp repeat imaging should be considered.  Follows with Dr. Moshe Cipro as an outpatient.  No need for RRT at present--> labs are pending for today; I am encouraged by the slight improvement in renal function after Foley placement yesterday (5.7-->5.5).  BUN slightly above baseline so hopefully myoclonic jerking doesn't represent uremia; I have decreased pt's Lyrica dose in response.    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 Tsat 16%, s/p  feraheme and ESA 12/9  #Dispo: pending clinical improvement  Subjective:    3.4L urine output overnight.  Sleeping this AM, easily arousable.  No complaints.  Labs pending.  No dysgeusia, hiccups, n/v     Objective:   BP (!) 108/50   Pulse 73   Temp 98.6 F (37 C) (Oral)   Resp 20   Ht 6\' 3"  (1.905 m)   Wt (!) 197.8 kg   SpO2 92%   BMI 54.51 kg/m   Intake/Output Summary (Last 24 hours) at 06/08/2019 1048 Last data filed at 06/08/2019 0700 Gross per 24 hour  Intake 1279.63 ml  Output 4200 ml  Net -2920.37 ml   Weight change:   Physical Exam: Gen: obese, NAD, lying  in bed CVS: RRR no m/r/g Resp: On O2, clear anteriorly, hard to ascultate posteriorly Abd: obese, nontender GU: Foley with rosy colored urine, improved Ext: trace LE edema with venous stasis changes NEURO: AAO x 3, some myoclonic jerking  Imaging: No results found.  Labs: BMET Recent Labs  Lab 06/04/19 0919 06/05/19 0958 06/06/19 0556 06/07/19 0634  NA 140 137 138 137  K 3.9 4.1 4.4 3.8  CL 95* 95* 96* 94*  CO2 33* 30 30 30   GLUCOSE 161* 167* 206* 185*  BUN 41* 46* 51* 54*  CREATININE 4.35* 5.26* 5.70* 5.56*  CALCIUM 8.5* 8.5* 8.2* 8.2*  PHOS  --   --   --  7.1*   CBC Recent Labs  Lab 06/04/19 0919 06/05/19 0958 06/06/19 0556 06/07/19 0634  WBC 3.5* 4.5 4.3 4.0  NEUTROABS 2.2  --   --   --   HGB 9.4* 8.2* 7.9* 7.3*  HCT 31.2* 27.4* 26.5* 23.9*  MCV 99.0 99.3 99.6 98.0  PLT 72* 68* 65* 65*    Medications:    . busPIRone  15 mg Oral BID  . calcitRIOL  0.25 mcg Oral Daily  . Chlorhexidine Gluconate Cloth  6 each Topical Daily  . clomiPHENE  25 mg Oral QODAY  . darbepoetin (ARANESP) injection - NON-DIALYSIS  150 mcg Subcutaneous Q Wed-1800  . DULoxetine  60 mg Oral Daily  . fentaNYL  1 patch Transdermal  Q72H  . finasteride  5 mg Oral Daily  . insulin aspart  0-20 Units Subcutaneous TID WC  . insulin aspart  0-5 Units Subcutaneous QHS  . insulin glargine  45 Units Subcutaneous BID  . lamoTRIgine  200 mg Oral BID  . levETIRAcetam  1,000 mg Oral BID  . loratadine  10 mg Oral Daily  . nystatin   Topical BID  . pantoprazole  40 mg Oral Daily  . polyethylene glycol  17 g Oral Daily  . pregabalin  50 mg Oral TID  . senna  2 tablet Oral Daily  . simvastatin  20 mg Oral QHS  . tamsulosin  0.4 mg Oral Daily  . warfarin  5 mg Oral ONCE-1800  . Warfarin - Pharmacist Dosing Inpatient   Does not apply q1800      Madelon Lips, MD 06/08/2019, 10:48 AM

## 2019-06-08 NOTE — Discharge Summary (Signed)
Physician Discharge Summary  [name] HUT:[id number] DOB: Jun 05, 1966 DOA: 06/04/2019  PCP: Sharion Balloon, FNP  Admit date: 06/04/2019 Discharge date: 06/08/2019  Time spent: 35 minutes  Recommendations for Outpatient Follow-up:  1. Repeat basic metabolic panel to follow electrolytes and renal function 2. Repeat CBC to follow hemoglobin trend 3. Outpatient follow-up with urology and nephrology service as instructed.   Discharge Diagnoses:  Active Problems:   Morbid obesity ([place])   Obesity hypoventilation syndrome (HCC)   Chronic anticoagulation   Chronic pain syndrome   Opioid dependence (HCC)   Seizures (HCC)   Pancytopenia (HCC)   UTI (urinary tract infection)   Acute pyelonephritis   Pyelonephritis   Discharge Condition: Stable and improved.  Patient discharged home with instructions to follow-up with PCP.  Diet recommendation: Heart healthy/low-sodium, low calorie diet and modified carbohydrate diet.  Filed Weights   06/04/19 0811 06/04/19 0907  Weight: (!) 192.8 kg (!) 197.8 kg    History of present illness:  53 y.o.malewith medical history ofCKD stage IV, bipolar disorder, chronic respiratory failure on 3-4L, diabetes mellitus type 2, DVT, OSA/OHS, pulmonary embolus, seizure disorder, thrombocytopenia, hyperlipidemia, chronic pain syndrome presenting with 2 days of dysuria and hematuria.Apparently, the patient called his primary care provider on 06/01/2019 with dysuria and hematuria. Urine culture was sent, and the patient was started on Augmentin. His symptoms have persisted. In addition, the patient had complained of left-sided flank pain. He denied any fevers, chills, chest pain, nausea, vomiting, diarrhea. The patient has had 2 recent hospitalizations, most recently 05/09/2019 through 11/162010 which time he was treated for sepsis secondary to Enterococcus faecalis UTI and acute on chronic renal failure. He was given a penicillin challenge and  passed. He was discharged home with amoxicillin. During the hospitalization he received 1 unit PRBC. The patient was also recently hospitalized from 04/09/2019 to 04/25/2019 for acute on chronic respiratory failure secondary to COVID-19 pneumonia. In the emergency department, the patient had low-grade temperature of 99.0 F but was hemodynamically stable. Oxygen saturation was 94-96% on 3 L. Initial presentation and showed serum creatinine 4.35 with potassium 3.9. WBC 3.5, hemoglobin 9.4, platelets 72,000.  Hospital Course:  Pyuria/hematuria/Pyelonephritis -06/04/19 CT abd/pelvis--increase mild R-pelvicaliectasis with hazy perinephric stranding -Patient treated with zosyn IV while inpatient and subsequently discharged on Augmentin to complete antibiotic therapy. -12/6 urine culture neg, but 12/3 urine = Efaecalis; plan is for 7 days treatment total.. -urology consult appreciated-->continue nonoperative management -Started on Flomax and finasteride. -12/8--hematuria improving when comparing pictures of hematuria from home -baseline Hgb 8-9 -Continue to follow hematuria response after restarting Coumadin.  Acute on chronic renal failure--CKD4 -wide variations in baseline but ranges from 3.5-4.2 -Suspect the patient has progression of his underlying CKD -appreciate nephrology consult--discussed with Dr. Hollie Salk on 06/07/2019. -Creatinine at discharge 5.4 -Patient demonstrated increased urine output -Continue minimizing the use of nephrotoxic agents including diuretics. -Follow-up with nephrology service as instructed.  Uncontrolled/poorly controlled diabetes mellitus type 2, with hyperglycemia -Continue reduced dose Lantus -NovoLog sliding scale -04/10/2019 hemoglobin A1c 7.9 -Continue holding Jardiance at discharge -Resume Trulicity -Close follow-up with PCP for further adjustment to hypoglycemic regimen.  Chronic Respiratory Failure with Hypoxia -secondary to OSA/OHS -stable on  3-4L which is his home demand -Currently appears to be on his baseline oxygen -not on CPAP at home  History of DVT/PE -Restart Coumadin and follow INR trend.  Bipolar disorder -Continue Lamictal and BuSpar and Cymbalta -No hallucinations.  Chronic pain syndrome -Continue home dose of fentanyl and hydrocodone -Continue  Lyrica at adjusted dose.  Morbid obesity -Lifestyle modification, portion control, low calorie diet and increase physical activity discussed with patient. -BMI 53.12  Dyslipidemia -Continue statin -Heart healthy diet has been recommended.  Seizure disorder -Continue Keppra -No seizure activity appreciated.  Thrombocytopenia/Pancytopenia -This has been chronicsecondary to chronic hepatic congestion from OSA/OHS and hepatic cirrhosis -patient received 1u PRBC on 05/13/2019 -Continue to follow hemoglobin trend. -No transfusion needed at this point -Per nephrology service recommendations will give IV Feraheme and ASA. -Repeat CBC at follow-up visit to reassess hemoglobin and platelets count.  Procedures:  See below for x-ray reports.  Consultations:  Nephrology  Urology  Discharge Exam: Vitals:   06/08/19 0538 06/08/19 1343  BP: (!) 108/50 129/64  Pulse: 73 75  Resp:  20  Temp: 98.6 F (37 C) 98.7 F (37.1 C)  SpO2: 92% 94%   General exam: Alert, awake, oriented x 3; afebrile, no chest pain, no nausea, no vomiting.  More interactive and expressing increased urine output.  No significant hematuria appreciated. Respiratory system: Good air movement bilaterally; positive rhonchi right, no wheezing.  No using accessory muscles.  Cardiovascular system:RRR. No murmurs, rubs, gallops. Gastrointestinal system: Abdomen is nondistended, soft and nontender. No organomegaly or masses felt. Normal bowel sounds heard. Central nervous system: Alert and oriented. No focal neurological deficits. Extremities: No cyanosis or clubbing; 1+ edema bilaterally  appreciated.  Skin: No rashes, lesions or ulcers Psychiatry: Judgement and insight appear normal. Mood & affect appropriate.    Discharge Instructions   Discharge Instructions    Diet - low sodium heart healthy   Complete by: As directed    Discharge instructions   Complete by: As directed    -Follow modified carbohydrate, low sodium and low calorie diet -Maintain adequate hydration -Follow-up with urology service as instructed -Follow-up with PCP in 2 weeks -Follow-up with nephrologist as instructed.     Allergies as of 06/08/2019      Reactions   Bee Venom Anaphylaxis, Shortness Of Breath, Swelling   Other Shortness Of Breath   Itching, rash with IVP DYE, iodine, shellfish LATEX   Shellfish Allergy Nausea And Vomiting, Other (See Comments)   Feels like insides are twisting   Iodinated Diagnostic Agents    Other reaction(s): RASH   Iohexol     Code: RASH, Desc: PT WAS ON PREDNISONE FOR GOUT TX. @ TIME OF SCAN AND RECEIVED 50 MG OF BENADRYL IV-ARS 10/08/07   Iodine Rash   Latex Rash      Medication List    STOP taking these medications   amoxicillin-clavulanate 875-125 MG tablet Commonly known as: AUGMENTIN Replaced by: amoxicillin-clavulanate 500-125 MG tablet   Jardiance 25 MG Tabs tablet Generic drug: empagliflozin   Klor-Con M10 10 MEQ tablet Generic drug: potassium chloride     TAKE these medications   amoxicillin-clavulanate 500-125 MG tablet Commonly known as: Augmentin Take 1 tablet (500 mg total) by mouth 2 (two) times daily for 7 days. Replaces: amoxicillin-clavulanate 875-125 MG tablet   ascorbic acid 500 MG tablet Commonly known as: VITAMIN C Take 1 tablet (500 mg total) by mouth daily.   busPIRone 15 MG tablet Commonly known as: BUSPAR TAKE 1 TABLET (15 MG TOTAL) BY MOUTH 2 (TWO) TIMES DAILY. What changed: See the new instructions.   calcitRIOL 0.25 MCG capsule Commonly known as: ROCALTROL Take 0.25 mcg by mouth daily.   cetirizine 10  MG tablet Commonly known as: ZYRTEC Take 1 tablet (10 mg total) by mouth daily.   clomiPHENE  50 MG tablet Commonly known as: CLOMID Take 0.5 tablets (25 mg total) by mouth every other day.   cyclobenzaprine 10 MG tablet Commonly known as: FLEXERIL TAKE 1/2 TO 1 TABLET BY MOUTH AS NEEDED AT BEDTIME FOR MUSCLE CRAMPS / SPASMS What changed:   how much to take  how to take this  when to take this  reasons to take this   docusate sodium 100 MG capsule Commonly known as: COLACE Take 1 capsule (100 mg total) by mouth 2 (two) times daily.   DULoxetine 60 MG capsule Commonly known as: CYMBALTA TAKE 1 CAPSULE BY MOUTH EVERY DAY What changed: how much to take   Emgality 120 MG/ML Soaj Generic drug: Galcanezumab-gnlm Inject 120 mg as directed every 30 (thirty) days.   EpiPen 2-Pak 0.3 mg/0.3 mL Soaj injection Generic drug: EPINEPHrine INJECT 0.3 MLS (0.3 MG TOTAL) INTO THE MUSCLE ONCE. AS NEEDED FOR ANAPHYLACTIC REACTION   esomeprazole 40 MG capsule Commonly known as: NEXIUM TAKE 1 CAPSULE (40 MG TOTAL) BY MOUTH DAILY AT 12 NOON.   fentaNYL 75 MCG/HR Commonly known as: [place] 1 patch onto the skin every 3 (three) days.   finasteride 5 MG tablet Commonly known as: PROSCAR Take 1 tablet (5 mg total) by mouth daily. Start taking on: June 09, 2019   fluticasone 50 MCG/ACT nasal spray Commonly known as: FLONASE PLACE 1 SPRAY INTO BOTH NOSTRILS 2 (TWO) TIMES DAILY AS NEEDED FOR ALLERGIES.   furosemide 80 MG tablet Commonly known as: LASIX Take 1 tablet (80 mg total) by mouth 2 (two) times daily. Hold until follow up with nephrology What changed: additional instructions   HYDROcodone-acetaminophen 7.5-325 MG tablet Commonly known as: Norco Take 1 tablet by mouth every 6 (six) hours as needed for moderate pain.   insulin lispro 100 UNIT/ML KwikPen Commonly known as: HUMALOG Inject 0.25 mLs (25 Units total) into the skin 3 (three) times daily. What changed:  how much to take   lamoTRIgine 200 MG tablet Commonly known as: LAMICTAL TAKE 1 TABLET BY MOUTH TWICE A DAY   levETIRAcetam 1000 MG tablet Commonly known as: KEPPRA Take 1 tablet (1,000 mg total) by mouth 2 (two) times daily.   Narcan 4 MG/0.1ML Liqd nasal spray kit Generic drug: naloxone SMARTSIG:1 Spray(s) Both Nares As Needed   pregabalin 50 MG capsule Commonly known as: LYRICA TAKE 1 CAPSULE BY MOUTH THREE TIMES A DAY What changed: See the new instructions.   ProAir HFA 108 (90 Base) MCG/ACT inhaler Generic drug: albuterol TAKE 2 PUFFS BY MOUTH EVERY 6 HOURS AS NEEDED FOR WHEEZE OR SHORTNESS OF BREATH   rizatriptan 10 MG tablet Commonly known as: MAXALT May repeat in 2 hours if needed What changed:   how much to take  how to take this  when to take this  reasons to take this   simvastatin 20 MG tablet Commonly known as: ZOCOR Take 1 tablet (20 mg total) by mouth at bedtime.   tamsulosin 0.4 MG Caps capsule Commonly known as: FLOMAX TAKE 1 CAPSULE BY MOUTH EVERY DAY What changed: additional instructions   Toujeo SoloStar 300 UNIT/ML Sopn Generic drug: Insulin Glargine (1 Unit Dial) Inject 60 Units into the skin 2 (two) times daily. What changed:   how much to take  when to take this   Trulicity 1.5 QH/4.7ML Sopn Generic drug: Dulaglutide Inject 1.5 mg as directed once a week.   Vitamin D (Ergocalciferol) 1.25 MG (50000 UT) Caps capsule Commonly known as: DRISDOL Take 50,000  Units by mouth once a week.   warfarin 3 MG tablet Commonly known as: COUMADIN Take as directed. If you are unsure how to take this medication, talk to your nurse or doctor. Original instructions: Take 1 tablet (3 mg total) by mouth one time only at 6 PM.   warfarin 5 MG tablet Commonly known as: COUMADIN Take as directed. If you are unsure how to take this medication, talk to your nurse or doctor. Original instructions: Take 1 tablet (5 mg total) by mouth daily. Take 62m  daily   zinc sulfate 220 (50 Zn) MG capsule Take 1 capsule (220 mg total) by mouth 2 (two) times daily.            Durable Medical Equipment  (From admission, onward)         Start     Ordered   06/08/19 1518  For home use only DME Hospital bed  Once    Question Answer Comment  Length of Need Lifetime   Patient has (list medical condition): morbid obesity, OHS and GERD   The above medical condition requires: Patient requires the ability to reposition immediately   Head must be elevated greater than: 30 degrees   Bed type Heavy-duty, semi-electric (for patients >350 lbs.)   HReliant EnergyYes   Trapeze Bar Yes      06/08/19 1518         Allergies  Allergen Reactions  . Bee Venom Anaphylaxis, Shortness Of Breath and Swelling  . Other Shortness Of Breath    Itching, rash with IVP DYE, iodine, shellfish LATEX  . Shellfish Allergy Nausea And Vomiting and Other (See Comments)    Feels like insides are twisting  . Iodinated Diagnostic Agents     Other reaction(s): RASH  . Iohexol      Code: RASH, Desc: PT WAS ON PREDNISONE FOR GOUT TX. @ TIME OF SCAN AND RECEIVED 50 MG OF BENADRYL IV-ARS 10/08/07   . Iodine Rash  . Latex Rash   Follow-up Information    DFranchot Gallo MD. Call.   Specialty: Urology Contact information: [address] [place] [phone]        HSharion Balloon FNP. Schedule an appointment as soon as possible for a visit in 2 week(s).   Specialty: Family Medicine Contact information: [phone]        GCorliss Parish MD. Call.   Specialty: Nephrology Why: office to set up follow up appointment in 10 days. Contact information: [phone]          The results of significant diagnostics from this hospitalization (including imaging, microbiology, ancillary and laboratory) are listed below for reference.    Significant Diagnostic  Studies: [place]RENAL  Result Date: 05/11/2019 CLINICAL DATA:  Acute renal injury. EXAM: RENAL / URINARY TRACT ULTRASOUND COMPLETE COMPARISON:  Ultrasound 02/21/2019.  CT 12/28/2018. FINDINGS: Right Kidney: Renal measurements: 14.1 x 6.8 x 8.8 cm = volume: 441.5 mL . Echogenicity within normal limits. 3.1 cm complex right renal cyst. Although this appears smaller than noted on prior study, on today's exam a slightly thickened septation is noted within this cyst. No hydronephrosis visualized. Left Kidney: Not visualized. Atrophic left kidney noted on prior CT of 12/28/2018. Bladder: Appears normal for degree of bladder distention. Other: Exam limited due to patient's body habitus. IMPRESSION: 1. 3.1 cm complex right renal cyst is noted. Although this appears smaller than on prior study,  on today's exam with slightly thickened septation is noted within the cyst. Further evaluation with renal MRI should be considered. 2.  Left renal atrophy. Electronically Signed   By: Marcello Moores  Register   On: 05/11/2019 12:29   DG CHEST PORT 1 VIEW  Result Date: 05/11/2019 CLINICAL DATA:  53 year old male with decreased urination and hematuria. History of COVID-19. EXAM: PORTABLE CHEST 1 VIEW COMPARISON:  Chest radiograph dated 04/19/2019. FINDINGS: Bilateral peripheral and subpleural airspace densities similar or minimally improved. No new consolidative changes. There is no pleural effusion or pneumothorax. Stable cardiomegaly. No acute osseous pathology. IMPRESSION: Bilateral peripheral and subpleural airspace densities similar or minimally improved compared to the prior radiograph. Electronically Signed   By: Anner Crete M.D.   On: 05/11/2019 14:58   CT Renal Stone Study  Result Date: 06/04/2019 CLINICAL DATA:  Flank pain. Recurrent urinary tract infections. EXAM: CT ABDOMEN AND PELVIS WITHOUT CONTRAST TECHNIQUE: Multidetector CT imaging of the abdomen and pelvis was performed following the standard protocol without IV  contrast. COMPARISON:  12/28/2018 FINDINGS: Lower chest: Increased patchy areas airspace disease are seen in both lower lungs, right side greater than left. No evidence of pleural effusion. Hepatobiliary: No mass visualized on this unenhanced exam. Mild capsular nodularity and left and caudate lobe hypertrophy again seen, highly suspicious for cirrhosis. Prior cholecystectomy. No evidence of biliary obstruction. Pancreas: No mass or inflammatory process visualized on this unenhanced exam. Spleen: Moderate splenomegaly measuring 18-19 cm in length is stable and consistent with portal venous hypertension. Adrenals/Urinary tract: Chronic atrophy of the left kidney with several small peripherally calcified cyst is unchanged in appearance. Compensatory hypertrophy of the right kidney is again seen. Mild increase in hazy opacity is seen in the right perinephric region. Mild right pelvicaliectasis is seen containing high attenuation fluid suspicious for hemorrhage. No evidence of renal or ureteral calculi. Unremarkable unopacified urinary bladder. Stomach/Bowel: No evidence of obstruction, inflammatory process, or abnormal fluid collections. Vascular/Lymphatic: No pathologically enlarged lymph nodes identified. No evidence of abdominal aortic aneurysm. Aortic atherosclerosis. Reproductive:  No mass or other significant abnormality. Other:  None. Musculoskeletal:  No suspicious bone lesions identified. IMPRESSION: 1. Increased mild right renal pelvicaliectasis and hazy perinephric opacity. No obstructing calculi identified. These findings are nonspecific, but pyelonephritis cannot be excluded. 2. Stable chronic left renal atrophy. 3. Stable findings of hepatic cirrhosis and portal venous hypertension. No evidence of ascites. 4. Increased patchy areas of airspace disease in both lower lungs, right side greater than left, suspicious for atypical infectious or inflammatory process. Aortic Atherosclerosis (ICD10-I70.0).  Electronically Signed   By: Marlaine Hind M.D.   On: 06/04/2019 10:13    Microbiology: Recent Results (from the past 240 hour(s))  Urine culture     Status: Abnormal   Collection Time: 06/01/19  8:43 AM   Specimen: Urine   UR  Result Value Ref Range Status   Urine Culture, Routine Final report (A)  Final   Organism ID, Bacteria Enterococcus faecalis (A)  Final    Comment: 50,000-100,000 colony forming units per mL   Antimicrobial Susceptibility Comment  Final    Comment:       ** S = Susceptible; I = Intermediate; R = Resistant **                    P = Positive; N = Negative             MICS are expressed in micrograms per mL    Antibiotic  RSLT#1    RSLT#2    RSLT#3    RSLT#4 Ciprofloxacin                  S Levofloxacin                   S Nitrofurantoin                 S Penicillin                     S Tetracycline                   R Vancomycin                     S   Microscopic Examination     Status: Abnormal   Collection Time: 06/01/19  8:43 AM   URINE  Result Value Ref Range Status   WBC, UA >30 (A) 0 - 5 /hpf Final   RBC 3-10 (A) 0 - 2 /hpf Final   Epithelial Cells (non renal) None seen 0 - 10 /hpf Final   Renal Epithel, UA None seen None seen /hpf Final   Bacteria, UA Many (A) None seen/Few Final  Urine culture     Status: None   Collection Time: 06/04/19 12:08 PM   Specimen: Urine, Catheterized  Result Value Ref Range Status   Specimen Description   Final    URINE, CATHETERIZED Performed at [facility], [address] [place], [place] [address]    Special Requests   Final    NONE Performed at [facility], [address] [place], [place] [address]    Culture   Final    NO GROWTH Performed at [facility], [place] [address] [place], [place] [address]    Report Status 06/06/2019 FINAL  Final     Labs: Basic Metabolic Panel: Recent Labs  Lab 06/05/19 0958 06/06/19 0556 06/07/19 0634 06/08/19 0541 06/08/19 1356  NA 137 138 137  137 138  K 4.1 4.4 3.8 3.8 3.6  CL 95* 96* 94* 95* 96*  CO2 30 30 30 29 29   GLUCOSE 167* 206* 185* 194* 159*  BUN 46* 51* 54* 57* 54*  CREATININE 5.26* 5.70* 5.56* 5.85* 5.45*  CALCIUM 8.5* 8.2* 8.2* 8.2* 8.3*  PHOS  --   --  7.1* 6.3* 5.9*   Liver Function Tests: Recent Labs  Lab 06/04/19 0919 06/07/19 0634 06/08/19 0541 06/08/19 1356  AST 22  --   --   --   ALT 10  --   --   --   ALKPHOS 68  --   --   --   BILITOT 0.7  --   --   --   PROT 7.3  --   --   --   ALBUMIN 2.7* 2.3* 2.3* 2.3*   CBC: Recent Labs  Lab 06/04/19 0919 06/05/19 0958 06/06/19 0556 06/07/19 0634 06/08/19 0541 06/08/19 1356  WBC 3.5* 4.5 4.3 4.0 3.0* 3.0*  NEUTROABS 2.2  --   --   --   --   --   HGB 9.4* 8.2* 7.9* 7.3* 7.1* 7.1*  HCT 31.2* 27.4* 26.5* 23.9* 23.8* 23.0*  MCV 99.0 99.3 99.6 98.0 99.2 97.5  PLT 72* 68* 65* 65* 66* 70*   BNP (last 3 results) Recent Labs    04/18/19 0315 04/20/19 0215 04/24/19 0145  BNP 18.1 34.6 137.9*    CBG: Recent Labs  Lab 06/07/19 1108 06/07/19 1612 06/07/19 2202 06/08/19 0746 06/08/19 1135  GLUCAP 174* 209* 197* 155* 162*    Signed:  Barton Dubois MD.  [facility] 06/08/2019, 3:19 PM

## 2019-06-08 NOTE — Progress Notes (Signed)
Patient requested to be sent home with his foley catheter. He said he talked with the doctor about it and has a follow up appointment with his doctor.

## 2019-06-08 NOTE — Progress Notes (Signed)
ANTICOAGULATION CONSULT NOTE -   Pharmacy Consult for warfarin Indication: history of DVT/PE  Allergies  Allergen Reactions  . Bee Venom Anaphylaxis, Shortness Of Breath and Swelling  . Other Shortness Of Breath    Itching, rash with IVP DYE, iodine, shellfish LATEX  . Shellfish Allergy Nausea And Vomiting and Other (See Comments)    Feels like insides are twisting  . Iodinated Diagnostic Agents     Other reaction(s): RASH  . Iohexol      Code: RASH, Desc: PT WAS ON PREDNISONE FOR GOUT TX. @ TIME OF SCAN AND RECEIVED 50 MG OF BENADRYL IV-ARS 10/08/07   . Iodine Rash  . Latex Rash    Patient Measurements: Height: 6' 3"  (190.5 cm) Weight: (!) 436 lb 1.6 oz (197.8 kg) IBW/kg (Calculated) : 84.5   Vital Signs: Temp: 98.6 F (37 C) (12/10 0538) Temp Source: Oral (12/10 0538) BP: 108/50 (12/10 0538) Pulse Rate: 73 (12/10 0538)  Labs: Recent Labs    06/05/19 0958 06/06/19 0556 06/07/19 0634 06/08/19 0541  HGB 8.2* 7.9* 7.3*  --   HCT 27.4* 26.5* 23.9*  --   PLT 68* 65* 65*  --   LABPROT  --  30.0* 23.7* 20.7*  INR  --  2.9* 2.1* 1.8*  CREATININE 5.26* 5.70* 5.56*  --     Estimated Creatinine Clearance: 28.5 mL/min (A) (by C-G formula based on SCr of 5.56 mg/dL (H)).   Medical History: Past Medical History:  Diagnosis Date  . Anemia   . Anxiety   . Arthritis   . Bipolar 1 disorder ([place])   . Bronchitis    hx of  . Bruises easily   . Chronic kidney disease    decreased left kidney fx  . Chronic pain syndrome 05/11/2012  . Chronic respiratory failure with hypoxia ([facility])    And with hypercapnia  . Diabetes mellitus without complication ([place])   . Diabetic neuropathy ([place])   . DVT (deep venous thrombosis) ([facility])    LLE DVT ~ '12  . Dyspnea    with ambulation  . GERD (gastroesophageal reflux disease)   . History of 2019 novel coronavirus disease (COVID-19)   . HOH (hard of hearing) 2015   has hearing aids  . Mental disorder   . Migraine   . Neuromuscular  disorder ([place])   . Obesity hypoventilation syndrome ([place])   . Obstructive sleep apnea    CPAP  . PE (pulmonary embolism)    bilateral PE ~ '11  . Seizures ([place])   . Thrombocytopenia ([place]) 05/11/2012    Medications:  Medications Prior to Admission  Medication Sig Dispense Refill Last Dose  . amoxicillin-clavulanate (AUGMENTIN) 875-125 MG tablet Take 1 tablet by mouth 2 (two) times daily. 20 tablet 0 06/04/2019 at Unknown time  . busPIRone (BUSPAR) 15 MG tablet TAKE 1 TABLET (15 MG TOTAL) BY MOUTH 2 (TWO) TIMES DAILY. (Patient taking differently: Take 15 mg by mouth 2 (two) times daily. TAKE 1 TABLET (15 MG TOTAL) BY MOUTH 2 (TWO) TIMES DAILY.) 180 tablet 0 06/04/2019 at Unknown time  . calcitRIOL (ROCALTROL) 0.25 MCG capsule Take 0.25 mcg by mouth daily.   06/04/2019 at Unknown time  . cetirizine (ZYRTEC) 10 MG tablet Take 1 tablet (10 mg total) by mouth daily. 30 tablet 11 06/04/2019 at Unknown time  . clomiPHENE (CLOMID) 50 MG tablet Take 0.5 tablets (25 mg total) by mouth every other day. 24 tablet 3 06/04/2019 at Unknown time  . cyclobenzaprine (FLEXERIL) 10 MG  tablet TAKE 1/2 TO 1 TABLET BY MOUTH AS NEEDED AT BEDTIME FOR MUSCLE CRAMPS / SPASMS (Patient taking differently: Take 10 mg by mouth daily as needed. TAKE 1/2 TO 1 TABLET BY MOUTH AS NEEDED AT BEDTIME FOR MUSCLE CRAMPS / SPASMS) 90 tablet 3 06/03/2019 at Unknown time  . docusate sodium (COLACE) 100 MG capsule Take 1 capsule (100 mg total) by mouth 2 (two) times daily. 10 capsule 0 06/04/2019 at Unknown time  . DULoxetine (CYMBALTA) 60 MG capsule TAKE 1 CAPSULE BY MOUTH EVERY DAY (Patient taking differently: Take 60 mg by mouth daily. ) 90 capsule 0 06/04/2019 at Unknown time  . EMGALITY 120 MG/ML SOAJ Inject 120 mg as directed every 30 (thirty) days.    Past Month at Unknown time  . EPIPEN 2-PAK 0.3 MG/0.3ML SOAJ injection INJECT 0.3 MLS (0.3 MG TOTAL) INTO THE MUSCLE ONCE. AS NEEDED FOR ANAPHYLACTIC REACTION 2 Device 2   . esomeprazole  (NEXIUM) 40 MG capsule TAKE 1 CAPSULE (40 MG TOTAL) BY MOUTH DAILY AT 12 NOON. 90 capsule 0 06/04/2019 at Unknown time  . fentaNYL (DURAGESIC) 75 MCG/HR Place 1 patch onto the skin every 3 (three) days. 10 patch 0 06/02/2019 at Unknown time  . fluticasone (FLONASE) 50 MCG/ACT nasal spray PLACE 1 SPRAY INTO BOTH NOSTRILS 2 (TWO) TIMES DAILY AS NEEDED FOR ALLERGIES. 48 g 4 06/03/2019 at Unknown time  . furosemide (LASIX) 80 MG tablet Take 1 tablet (80 mg total) by mouth 2 (two) times daily. 180 tablet 1 06/04/2019 at Unknown time  . HYDROcodone-acetaminophen (NORCO) 7.5-325 MG tablet Take 1 tablet by mouth every 6 (six) hours as needed for moderate pain. 90 tablet 0 06/04/2019 at Unknown time  . Insulin Glargine, 1 Unit Dial, (TOUJEO SOLOSTAR) 300 UNIT/ML SOPN Inject 60 Units into the skin 2 (two) times daily. (Patient taking differently: Inject 200 Units into the skin daily. ) 130 pen 3 06/04/2019 at Unknown time  . insulin lispro (HUMALOG) 100 UNIT/ML KwikPen Inject 0.25 mLs (25 Units total) into the skin 3 (three) times daily. (Patient taking differently: Inject 50 Units into the skin 3 (three) times daily. ) 45 mL 0 06/04/2019 at Unknown time  . JARDIANCE 25 MG TABS tablet Take 25 mg by mouth daily.    06/04/2019 at Unknown time  . KLOR-CON M10 10 MEQ tablet TAKE 3 TABLETS (30 MEQ TOTAL) BY MOUTH DAILY. (Patient taking differently: Take 30 mEq by mouth daily. ) 270 tablet 0 06/04/2019 at Unknown time  . lamoTRIgine (LAMICTAL) 200 MG tablet TAKE 1 TABLET BY MOUTH TWICE A DAY (Patient taking differently: Take 200 mg by mouth 2 (two) times daily. ) 180 tablet 0 06/04/2019 at Unknown time  . levETIRAcetam (KEPPRA) 1000 MG tablet Take 1 tablet (1,000 mg total) by mouth 2 (two) times daily. 180 tablet 3 06/04/2019 at Unknown time  . NARCAN 4 MG/0.1ML LIQD nasal spray kit SMARTSIG:1 Spray(s) Both Nares As Needed     . pregabalin (LYRICA) 50 MG capsule TAKE 1 CAPSULE BY MOUTH THREE TIMES A DAY (Patient taking  differently: Take 50 mg by mouth 3 (three) times daily. TAKE 1 CAPSULE BY MOUTH THREE TIMES A DAY) 270 capsule 1 06/04/2019 at Unknown time  . PROAIR HFA 108 (90 Base) MCG/ACT inhaler TAKE 2 PUFFS BY MOUTH EVERY 6 HOURS AS NEEDED FOR WHEEZE OR SHORTNESS OF BREATH 18 g 0 06/03/2019 at Unknown time  . rizatriptan (MAXALT) 10 MG tablet May repeat in 2 hours if needed (Patient taking differently:  Take 10 mg by mouth as needed. May repeat in 2 hours if needed) 10 tablet 11   . simvastatin (ZOCOR) 20 MG tablet Take 1 tablet (20 mg total) by mouth at bedtime. 90 tablet 3 06/03/2019 at Unknown time  . tamsulosin (FLOMAX) 0.4 MG CAPS capsule TAKE 1 CAPSULE BY MOUTH EVERY DAY (Patient taking differently: Take 0.4 mg by mouth daily. TAKE 1 CAPSULE BY MOUTH EVERY DAY) 90 capsule 0 06/04/2019 at Unknown time  . TRULICITY 1.5 VD/4.7VE SOPN Inject 1.5 mg as directed once a week.    Past Week at Unknown time  . vitamin C (VITAMIN C) 500 MG tablet Take 1 tablet (500 mg total) by mouth daily.   06/04/2019 at Unknown time  . Vitamin D, Ergocalciferol, (DRISDOL) 1.25 MG (50000 UT) CAPS capsule Take 50,000 Units by mouth once a week.   Past Week at Unknown time  . warfarin (COUMADIN) 5 MG tablet Take 1 tablet (5 mg total) by mouth daily. Take 67m daily 90 tablet 2 06/03/2019 at 1800  . zinc sulfate 220 (50 Zn) MG capsule Take 1 capsule (220 mg total) by mouth 2 (two) times daily.   06/04/2019 at Unknown time  . warfarin (COUMADIN) 3 MG tablet Take 1 tablet (3 mg total) by mouth one time only at 6 PM. (Patient not taking: Reported on 06/04/2019) 30 tablet 0 Not Taking at 1800    Assessment: Pharmacy consulted to dose warfarin in patient with history of DVT/PE.  Home warfarin dose listed as 5 mg daily with last dose on 12/5 1800.  hgb 8.2>> 7.9>>7.3  Nephology ordering feraheme INR 1.8  Goal of Therapy:  INR 2-3 Monitor platelets by anticoagulation protocol: Yes   Plan:  Coumadin 542mpo x 1 Monitor daily INR and s/s of  bleeding.  StMargot AblesPharmD Clinical Pharmacist 06/08/2019 8:54 AM

## 2019-06-10 DIAGNOSIS — M79603 Pain in arm, unspecified: Secondary | ICD-10-CM | POA: Diagnosis not present

## 2019-06-10 DIAGNOSIS — Z743 Need for continuous supervision: Secondary | ICD-10-CM | POA: Diagnosis not present

## 2019-06-11 ENCOUNTER — Inpatient Hospital Stay (HOSPITAL_COMMUNITY)
Admission: EM | Admit: 2019-06-11 | Discharge: 2019-07-03 | DRG: 673 | Disposition: A | Discharge status: Home Health | Payer: Medicare Other | Attending: Internal Medicine | Admitting: Internal Medicine

## 2019-06-11 ENCOUNTER — Emergency Department (HOSPITAL_COMMUNITY): Payer: Medicare Other

## 2019-06-11 ENCOUNTER — Other Ambulatory Visit: Payer: Self-pay

## 2019-06-11 ENCOUNTER — Encounter (HOSPITAL_COMMUNITY): Payer: Self-pay | Admitting: Emergency Medicine

## 2019-06-11 DIAGNOSIS — D696 Thrombocytopenia, unspecified: Secondary | ICD-10-CM | POA: Diagnosis present

## 2019-06-11 DIAGNOSIS — N185 Chronic kidney disease, stage 5: Secondary | ICD-10-CM

## 2019-06-11 DIAGNOSIS — Z9181 History of falling: Secondary | ICD-10-CM

## 2019-06-11 DIAGNOSIS — R040 Epistaxis: Secondary | ICD-10-CM | POA: Diagnosis not present

## 2019-06-11 DIAGNOSIS — S0990XA Unspecified injury of head, initial encounter: Secondary | ICD-10-CM | POA: Diagnosis not present

## 2019-06-11 DIAGNOSIS — N390 Urinary tract infection, site not specified: Secondary | ICD-10-CM | POA: Diagnosis not present

## 2019-06-11 DIAGNOSIS — N186 End stage renal disease: Secondary | ICD-10-CM | POA: Diagnosis not present

## 2019-06-11 DIAGNOSIS — Z794 Long term (current) use of insulin: Secondary | ICD-10-CM | POA: Diagnosis not present

## 2019-06-11 DIAGNOSIS — Z7901 Long term (current) use of anticoagulants: Secondary | ICD-10-CM | POA: Diagnosis not present

## 2019-06-11 DIAGNOSIS — N2882 Megaloureter: Secondary | ICD-10-CM | POA: Diagnosis not present

## 2019-06-11 DIAGNOSIS — G92 Toxic encephalopathy: Secondary | ICD-10-CM | POA: Diagnosis not present

## 2019-06-11 DIAGNOSIS — Z743 Need for continuous supervision: Secondary | ICD-10-CM | POA: Diagnosis not present

## 2019-06-11 DIAGNOSIS — K219 Gastro-esophageal reflux disease without esophagitis: Secondary | ICD-10-CM | POA: Diagnosis present

## 2019-06-11 DIAGNOSIS — D649 Anemia, unspecified: Secondary | ICD-10-CM | POA: Diagnosis not present

## 2019-06-11 DIAGNOSIS — Z6841 Body Mass Index (BMI) 40.0 and over, adult: Secondary | ICD-10-CM

## 2019-06-11 DIAGNOSIS — N12 Tubulo-interstitial nephritis, not specified as acute or chronic: Secondary | ICD-10-CM | POA: Diagnosis not present

## 2019-06-11 DIAGNOSIS — I279 Pulmonary heart disease, unspecified: Secondary | ICD-10-CM | POA: Diagnosis not present

## 2019-06-11 DIAGNOSIS — Z9841 Cataract extraction status, right eye: Secondary | ICD-10-CM

## 2019-06-11 DIAGNOSIS — G4733 Obstructive sleep apnea (adult) (pediatric): Secondary | ICD-10-CM | POA: Diagnosis present

## 2019-06-11 DIAGNOSIS — N2889 Other specified disorders of kidney and ureter: Secondary | ICD-10-CM | POA: Diagnosis not present

## 2019-06-11 DIAGNOSIS — G894 Chronic pain syndrome: Secondary | ICD-10-CM | POA: Diagnosis present

## 2019-06-11 DIAGNOSIS — N5089 Other specified disorders of the male genital organs: Secondary | ICD-10-CM | POA: Diagnosis not present

## 2019-06-11 DIAGNOSIS — E1165 Type 2 diabetes mellitus with hyperglycemia: Secondary | ICD-10-CM | POA: Diagnosis present

## 2019-06-11 DIAGNOSIS — Z9089 Acquired absence of other organs: Secondary | ICD-10-CM

## 2019-06-11 DIAGNOSIS — R21 Rash and other nonspecific skin eruption: Secondary | ICD-10-CM | POA: Diagnosis not present

## 2019-06-11 DIAGNOSIS — Z9981 Dependence on supplemental oxygen: Secondary | ICD-10-CM | POA: Diagnosis not present

## 2019-06-11 DIAGNOSIS — R519 Headache, unspecified: Secondary | ICD-10-CM | POA: Diagnosis not present

## 2019-06-11 DIAGNOSIS — T4275XA Adverse effect of unspecified antiepileptic and sedative-hypnotic drugs, initial encounter: Secondary | ICD-10-CM | POA: Diagnosis present

## 2019-06-11 DIAGNOSIS — S199XXA Unspecified injury of neck, initial encounter: Secondary | ICD-10-CM | POA: Diagnosis not present

## 2019-06-11 DIAGNOSIS — E1122 Type 2 diabetes mellitus with diabetic chronic kidney disease: Secondary | ICD-10-CM | POA: Diagnosis present

## 2019-06-11 DIAGNOSIS — Z992 Dependence on renal dialysis: Secondary | ICD-10-CM

## 2019-06-11 DIAGNOSIS — D62 Acute posthemorrhagic anemia: Secondary | ICD-10-CM | POA: Diagnosis not present

## 2019-06-11 DIAGNOSIS — Z8249 Family history of ischemic heart disease and other diseases of the circulatory system: Secondary | ICD-10-CM

## 2019-06-11 DIAGNOSIS — R310 Gross hematuria: Secondary | ICD-10-CM | POA: Diagnosis present

## 2019-06-11 DIAGNOSIS — R34 Anuria and oliguria: Secondary | ICD-10-CM | POA: Diagnosis not present

## 2019-06-11 DIAGNOSIS — Z8619 Personal history of other infectious and parasitic diseases: Secondary | ICD-10-CM

## 2019-06-11 DIAGNOSIS — Z86718 Personal history of other venous thrombosis and embolism: Secondary | ICD-10-CM | POA: Diagnosis not present

## 2019-06-11 DIAGNOSIS — R338 Other retention of urine: Secondary | ICD-10-CM | POA: Diagnosis present

## 2019-06-11 DIAGNOSIS — H9193 Unspecified hearing loss, bilateral: Secondary | ICD-10-CM | POA: Diagnosis present

## 2019-06-11 DIAGNOSIS — R0902 Hypoxemia: Secondary | ICD-10-CM | POA: Diagnosis not present

## 2019-06-11 DIAGNOSIS — N281 Cyst of kidney, acquired: Secondary | ICD-10-CM | POA: Diagnosis present

## 2019-06-11 DIAGNOSIS — Z86711 Personal history of pulmonary embolism: Secondary | ICD-10-CM | POA: Diagnosis present

## 2019-06-11 DIAGNOSIS — G43909 Migraine, unspecified, not intractable, without status migrainosus: Secondary | ICD-10-CM | POA: Diagnosis present

## 2019-06-11 DIAGNOSIS — F411 Generalized anxiety disorder: Secondary | ICD-10-CM | POA: Diagnosis present

## 2019-06-11 DIAGNOSIS — E1169 Type 2 diabetes mellitus with other specified complication: Secondary | ICD-10-CM | POA: Diagnosis not present

## 2019-06-11 DIAGNOSIS — G4700 Insomnia, unspecified: Secondary | ICD-10-CM | POA: Diagnosis present

## 2019-06-11 DIAGNOSIS — Z8616 Personal history of COVID-19: Secondary | ICD-10-CM

## 2019-06-11 DIAGNOSIS — Z7401 Bed confinement status: Secondary | ICD-10-CM | POA: Diagnosis not present

## 2019-06-11 DIAGNOSIS — F32A Depression, unspecified: Secondary | ICD-10-CM

## 2019-06-11 DIAGNOSIS — D61818 Other pancytopenia: Secondary | ICD-10-CM | POA: Diagnosis present

## 2019-06-11 DIAGNOSIS — Z960 Presence of urogenital implants: Secondary | ICD-10-CM | POA: Diagnosis present

## 2019-06-11 DIAGNOSIS — F329 Major depressive disorder, single episode, unspecified: Secondary | ICD-10-CM | POA: Diagnosis present

## 2019-06-11 DIAGNOSIS — Z9049 Acquired absence of other specified parts of digestive tract: Secondary | ICD-10-CM

## 2019-06-11 DIAGNOSIS — J9611 Chronic respiratory failure with hypoxia: Secondary | ICD-10-CM | POA: Diagnosis not present

## 2019-06-11 DIAGNOSIS — K08109 Complete loss of teeth, unspecified cause, unspecified class: Secondary | ICD-10-CM | POA: Diagnosis present

## 2019-06-11 DIAGNOSIS — M25461 Effusion, right knee: Secondary | ICD-10-CM | POA: Diagnosis not present

## 2019-06-11 DIAGNOSIS — Z8261 Family history of arthritis: Secondary | ICD-10-CM

## 2019-06-11 DIAGNOSIS — I4581 Long QT syndrome: Secondary | ICD-10-CM | POA: Diagnosis present

## 2019-06-11 DIAGNOSIS — I454 Nonspecific intraventricular block: Secondary | ICD-10-CM | POA: Diagnosis present

## 2019-06-11 DIAGNOSIS — I120 Hypertensive chronic kidney disease with stage 5 chronic kidney disease or end stage renal disease: Secondary | ICD-10-CM | POA: Diagnosis present

## 2019-06-11 DIAGNOSIS — Z79891 Long term (current) use of opiate analgesic: Secondary | ICD-10-CM

## 2019-06-11 DIAGNOSIS — D631 Anemia in chronic kidney disease: Secondary | ICD-10-CM | POA: Diagnosis not present

## 2019-06-11 DIAGNOSIS — F339 Major depressive disorder, recurrent, unspecified: Secondary | ICD-10-CM | POA: Diagnosis not present

## 2019-06-11 DIAGNOSIS — G8194 Hemiplegia, unspecified affecting left nondominant side: Secondary | ICD-10-CM | POA: Diagnosis not present

## 2019-06-11 DIAGNOSIS — S3991XA Unspecified injury of abdomen, initial encounter: Secondary | ICD-10-CM | POA: Diagnosis not present

## 2019-06-11 DIAGNOSIS — R161 Splenomegaly, not elsewhere classified: Secondary | ICD-10-CM | POA: Diagnosis present

## 2019-06-11 DIAGNOSIS — N179 Acute kidney failure, unspecified: Principal | ICD-10-CM | POA: Diagnosis present

## 2019-06-11 DIAGNOSIS — M069 Rheumatoid arthritis, unspecified: Secondary | ICD-10-CM | POA: Diagnosis present

## 2019-06-11 DIAGNOSIS — Z03818 Encounter for observation for suspected exposure to other biological agents ruled out: Secondary | ICD-10-CM | POA: Diagnosis not present

## 2019-06-11 DIAGNOSIS — E1140 Type 2 diabetes mellitus with diabetic neuropathy, unspecified: Secondary | ICD-10-CM | POA: Diagnosis present

## 2019-06-11 DIAGNOSIS — T1490XA Injury, unspecified, initial encounter: Secondary | ICD-10-CM

## 2019-06-11 DIAGNOSIS — Z800 Family history of malignant neoplasm of digestive organs: Secondary | ICD-10-CM

## 2019-06-11 DIAGNOSIS — Z79899 Other long term (current) drug therapy: Secondary | ICD-10-CM

## 2019-06-11 DIAGNOSIS — Z4901 Encounter for fitting and adjustment of extracorporeal dialysis catheter: Secondary | ICD-10-CM | POA: Diagnosis not present

## 2019-06-11 DIAGNOSIS — Z9842 Cataract extraction status, left eye: Secondary | ICD-10-CM

## 2019-06-11 DIAGNOSIS — E785 Hyperlipidemia, unspecified: Secondary | ICD-10-CM | POA: Diagnosis present

## 2019-06-11 DIAGNOSIS — Z803 Family history of malignant neoplasm of breast: Secondary | ICD-10-CM

## 2019-06-11 DIAGNOSIS — Z833 Family history of diabetes mellitus: Secondary | ICD-10-CM

## 2019-06-11 DIAGNOSIS — G40909 Epilepsy, unspecified, not intractable, without status epilepticus: Secondary | ICD-10-CM | POA: Diagnosis not present

## 2019-06-11 DIAGNOSIS — N184 Chronic kidney disease, stage 4 (severe): Secondary | ICD-10-CM | POA: Diagnosis not present

## 2019-06-11 DIAGNOSIS — Z961 Presence of intraocular lens: Secondary | ICD-10-CM | POA: Diagnosis present

## 2019-06-11 DIAGNOSIS — E8889 Other specified metabolic disorders: Secondary | ICD-10-CM | POA: Diagnosis present

## 2019-06-11 DIAGNOSIS — N19 Unspecified kidney failure: Secondary | ICD-10-CM | POA: Diagnosis not present

## 2019-06-11 DIAGNOSIS — E662 Morbid (severe) obesity with alveolar hypoventilation: Secondary | ICD-10-CM | POA: Diagnosis present

## 2019-06-11 DIAGNOSIS — J449 Chronic obstructive pulmonary disease, unspecified: Secondary | ICD-10-CM | POA: Diagnosis not present

## 2019-06-11 DIAGNOSIS — E119 Type 2 diabetes mellitus without complications: Secondary | ICD-10-CM

## 2019-06-11 DIAGNOSIS — Z91040 Latex allergy status: Secondary | ICD-10-CM

## 2019-06-11 DIAGNOSIS — M2550 Pain in unspecified joint: Secondary | ICD-10-CM | POA: Diagnosis not present

## 2019-06-11 DIAGNOSIS — R400 Somnolence: Secondary | ICD-10-CM | POA: Diagnosis not present

## 2019-06-11 DIAGNOSIS — I959 Hypotension, unspecified: Secondary | ICD-10-CM | POA: Diagnosis not present

## 2019-06-11 DIAGNOSIS — Z993 Dependence on wheelchair: Secondary | ICD-10-CM

## 2019-06-11 DIAGNOSIS — F319 Bipolar disorder, unspecified: Secondary | ICD-10-CM | POA: Diagnosis present

## 2019-06-11 DIAGNOSIS — Z91041 Radiographic dye allergy status: Secondary | ICD-10-CM

## 2019-06-11 DIAGNOSIS — N189 Chronic kidney disease, unspecified: Secondary | ICD-10-CM | POA: Diagnosis present

## 2019-06-11 LAB — COMPREHENSIVE METABOLIC PANEL
ALT: 10 U/L (ref 0–44)
AST: 19 U/L (ref 15–41)
Albumin: 2.4 g/dL — ABNORMAL LOW (ref 3.5–5.0)
Alkaline Phosphatase: 59 U/L (ref 38–126)
Anion gap: 16 — ABNORMAL HIGH (ref 5–15)
BUN: 65 mg/dL — ABNORMAL HIGH (ref 6–20)
CO2: 25 mmol/L (ref 22–32)
Calcium: 8.3 mg/dL — ABNORMAL LOW (ref 8.9–10.3)
Chloride: 95 mmol/L — ABNORMAL LOW (ref 98–111)
Creatinine, Ser: 9.42 mg/dL — ABNORMAL HIGH (ref 0.61–1.24)
GFR calc Af Amer: 7 mL/min — ABNORMAL LOW (ref >60)
GFR calc non Af Amer: 6 mL/min — ABNORMAL LOW (ref >60)
Glucose, Bld: 128 mg/dL — ABNORMAL HIGH (ref 70–99)
Potassium: 4.1 mmol/L (ref 3.5–5.1)
Sodium: 136 mmol/L (ref 135–145)
Total Bilirubin: 0.8 mg/dL (ref 0.3–1.2)
Total Protein: 6.9 g/dL (ref 6.5–8.1)

## 2019-06-11 LAB — CBC WITH DIFFERENTIAL/PLATELET
Abs Immature Granulocytes: 0.06 10*3/uL (ref 0.00–0.07)
Basophils Absolute: 0 10*3/uL (ref 0.0–0.1)
Basophils Relative: 1 %
Eosinophils Absolute: 0.3 10*3/uL (ref 0.0–0.5)
Eosinophils Relative: 7 %
HCT: 22.5 % — ABNORMAL LOW (ref 39.0–52.0)
Hemoglobin: 6.9 g/dL — CL (ref 13.0–17.0)
Immature Granulocytes: 1 %
Lymphocytes Relative: 21 %
Lymphs Abs: 1 10*3/uL (ref 0.7–4.0)
MCH: 30.3 pg (ref 26.0–34.0)
MCHC: 30.7 g/dL (ref 30.0–36.0)
MCV: 98.7 fL (ref 80.0–100.0)
Monocytes Absolute: 0.3 10*3/uL (ref 0.1–1.0)
Monocytes Relative: 6 %
Neutro Abs: 2.8 10*3/uL (ref 1.7–7.7)
Neutrophils Relative %: 64 %
Platelets: 90 10*3/uL — ABNORMAL LOW (ref 150–400)
RBC: 2.28 MIL/uL — ABNORMAL LOW (ref 4.22–5.81)
RDW: 14.6 % (ref 11.5–15.5)
WBC: 4.5 10*3/uL (ref 4.0–10.5)
nRBC: 0 % (ref 0.0–0.2)

## 2019-06-11 LAB — GLUCOSE, CAPILLARY: Glucose-Capillary: 76 mg/dL (ref 70–99)

## 2019-06-11 LAB — PROTIME-INR
INR: 2.8 — ABNORMAL HIGH (ref 0.8–1.2)
Prothrombin Time: 29.5 seconds — ABNORMAL HIGH (ref 11.4–15.2)

## 2019-06-11 LAB — MAGNESIUM: Magnesium: 2.5 mg/dL — ABNORMAL HIGH (ref 1.7–2.4)

## 2019-06-11 LAB — LIPASE, BLOOD: Lipase: 25 U/L (ref 11–51)

## 2019-06-11 LAB — PREPARE RBC (CROSSMATCH)

## 2019-06-11 LAB — AMMONIA: Ammonia: 23 umol/L (ref 9–35)

## 2019-06-11 MED ORDER — INSULIN ASPART 100 UNIT/ML ~~LOC~~ SOLN
0.0000 [IU] | Freq: Three times a day (TID) | SUBCUTANEOUS | Status: DC
  Administered 2019-06-13: 1 [IU] via SUBCUTANEOUS
  Administered 2019-06-13: 2 [IU] via SUBCUTANEOUS
  Administered 2019-06-16: 1 [IU] via SUBCUTANEOUS
  Administered 2019-06-18 (×2): 2 [IU] via SUBCUTANEOUS
  Administered 2019-06-20: 1 [IU] via SUBCUTANEOUS
  Administered 2019-06-20: 2 [IU] via SUBCUTANEOUS
  Administered 2019-06-20: 1 [IU] via SUBCUTANEOUS
  Administered 2019-06-21 – 2019-06-22 (×3): 2 [IU] via SUBCUTANEOUS
  Administered 2019-06-23: 1 [IU] via SUBCUTANEOUS
  Administered 2019-06-23 (×2): 2 [IU] via SUBCUTANEOUS
  Administered 2019-06-24: 1 [IU] via SUBCUTANEOUS
  Administered 2019-06-24 – 2019-06-25 (×2): 2 [IU] via SUBCUTANEOUS
  Administered 2019-06-25: 1 [IU] via SUBCUTANEOUS
  Administered 2019-06-25: 2 [IU] via SUBCUTANEOUS
  Administered 2019-06-26: 1 [IU] via SUBCUTANEOUS
  Administered 2019-06-27: 3 [IU] via SUBCUTANEOUS
  Administered 2019-06-27 – 2019-06-28 (×3): 2 [IU] via SUBCUTANEOUS
  Administered 2019-06-28: 1 [IU] via SUBCUTANEOUS
  Administered 2019-06-29 (×2): 2 [IU] via SUBCUTANEOUS
  Administered 2019-06-29 – 2019-06-30 (×2): 1 [IU] via SUBCUTANEOUS
  Administered 2019-06-30: 3 [IU] via SUBCUTANEOUS
  Administered 2019-06-30 – 2019-07-01 (×2): 2 [IU] via SUBCUTANEOUS
  Administered 2019-07-01: 1 [IU] via SUBCUTANEOUS
  Administered 2019-07-02: 2 [IU] via SUBCUTANEOUS
  Administered 2019-07-02: 1 [IU] via SUBCUTANEOUS
  Administered 2019-07-03: 2 [IU] via SUBCUTANEOUS
  Administered 2019-07-03: 1 [IU] via SUBCUTANEOUS

## 2019-06-11 MED ORDER — ACETAMINOPHEN 650 MG RE SUPP: 650.0000 mg | Freq: Four times a day (QID) | RECTAL | Status: DC | PRN

## 2019-06-11 MED ORDER — INSULIN GLARGINE 100 UNIT/ML ~~LOC~~ SOLN
45.0000 [IU] | Freq: Two times a day (BID) | SUBCUTANEOUS | Status: DC
  Administered 2019-06-12 – 2019-07-03 (×40): 45 [IU] via SUBCUTANEOUS
  Filled 2019-06-11 (×46): qty 0.45

## 2019-06-11 MED ORDER — LAMOTRIGINE 100 MG PO TABS
200.0000 mg | ORAL_TABLET | Freq: Two times a day (BID) | ORAL | Status: DC
  Administered 2019-06-11 – 2019-07-03 (×43): 200 mg via ORAL
  Filled 2019-06-11 (×45): qty 2

## 2019-06-11 MED ORDER — ONDANSETRON HCL 4 MG/2ML IJ SOLN
4.0000 mg | Freq: Four times a day (QID) | INTRAMUSCULAR | Status: DC | PRN
  Administered 2019-06-20: 4 mg via INTRAVENOUS
  Filled 2019-06-11: qty 2

## 2019-06-11 MED ORDER — TAMSULOSIN HCL 0.4 MG PO CAPS
0.4000 mg | ORAL_CAPSULE | Freq: Every day | ORAL | Status: DC
  Administered 2019-06-12 – 2019-07-03 (×22): 0.4 mg via ORAL
  Filled 2019-06-11 (×22): qty 1

## 2019-06-11 MED ORDER — POLYETHYLENE GLYCOL 3350 17 G PO PACK: 17.0000 g | PACK | Freq: Every day | ORAL | Status: DC | PRN

## 2019-06-11 MED ORDER — ONDANSETRON HCL 4 MG PO TABS: 4.0000 mg | ORAL_TABLET | Freq: Four times a day (QID) | ORAL | Status: DC | PRN

## 2019-06-11 MED ORDER — SODIUM CHLORIDE 0.9% IV SOLUTION
Freq: Once | INTRAVENOUS | Status: AC
  Administered 2019-06-11: via INTRAVENOUS

## 2019-06-11 MED ORDER — SIMVASTATIN 20 MG PO TABS
20.0000 mg | ORAL_TABLET | Freq: Every day | ORAL | Status: DC
  Administered 2019-06-11 – 2019-07-02 (×22): 20 mg via ORAL
  Filled 2019-06-11 (×22): qty 1

## 2019-06-11 MED ORDER — FLUTICASONE PROPIONATE 50 MCG/ACT NA SUSP
1.0000 | Freq: Two times a day (BID) | NASAL | Status: DC | PRN
  Filled 2019-06-11: qty 16

## 2019-06-11 MED ORDER — SODIUM CHLORIDE 0.9 % IV SOLN: Freq: Once | INTRAVENOUS | Status: DC

## 2019-06-11 MED ORDER — FINASTERIDE 5 MG PO TABS
5.0000 mg | ORAL_TABLET | Freq: Every day | ORAL | Status: DC
  Administered 2019-06-12 – 2019-07-03 (×22): 5 mg via ORAL
  Filled 2019-06-11 (×22): qty 1

## 2019-06-11 MED ORDER — AMOXICILLIN-POT CLAVULANATE 500-125 MG PO TABS
1.0000 | ORAL_TABLET | Freq: Two times a day (BID) | ORAL | Status: DC
  Administered 2019-06-11 – 2019-06-13 (×5): 500 mg via ORAL
  Filled 2019-06-11 (×5): qty 1

## 2019-06-11 MED ORDER — ALBUTEROL SULFATE (2.5 MG/3ML) 0.083% IN NEBU: 3.0000 mL | INHALATION_SOLUTION | Freq: Four times a day (QID) | RESPIRATORY_TRACT | Status: DC | PRN

## 2019-06-11 MED ORDER — SODIUM CHLORIDE 0.9 % IV BOLUS
500.0000 mL | Freq: Once | INTRAVENOUS | Status: AC
  Administered 2019-06-11: 500 mL via INTRAVENOUS

## 2019-06-11 MED ORDER — SODIUM CHLORIDE 0.9 % IV SOLN
INTRAVENOUS | Status: DC
  Administered 2019-06-11 – 2019-06-12 (×2): via INTRAVENOUS

## 2019-06-11 MED ORDER — LEVETIRACETAM 500 MG PO TABS
1000.0000 mg | ORAL_TABLET | Freq: Two times a day (BID) | ORAL | Status: DC
  Administered 2019-06-11 – 2019-06-18 (×15): 1000 mg via ORAL
  Filled 2019-06-11 (×11): qty 2
  Filled 2019-06-11: qty 4
  Filled 2019-06-11 (×4): qty 2

## 2019-06-11 MED ORDER — ACETAMINOPHEN 325 MG PO TABS
650.0000 mg | ORAL_TABLET | Freq: Four times a day (QID) | ORAL | Status: DC | PRN
  Administered 2019-06-13 – 2019-07-02 (×3): 650 mg via ORAL
  Filled 2019-06-11 (×3): qty 2

## 2019-06-11 NOTE — ED Notes (Signed)
Pt incontinent of stool  His testicles are reddened and raw He is cleansed with assist x 3   Pads under pt   He assisted with rolling

## 2019-06-11 NOTE — ED Notes (Signed)
Pt continues in CT

## 2019-06-11 NOTE — ED Notes (Signed)
Dr Laverta [place] has assessed  Lab called for labs

## 2019-06-11 NOTE — ED Notes (Signed)
Pt has foley in place   Recent ED visits   Was on cell phone no further info
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
## 2019-06-11 NOTE — ED Notes (Signed)
From CT 

## 2019-06-11 NOTE — ED Notes (Signed)
Critical call from lab  POC: [name]  Hgb 6.9

## 2019-06-11 NOTE — ED Notes (Signed)
Per pt spouse, pt has not urinated in one week

## 2019-06-11 NOTE — ED Triage Notes (Signed)
Pt recently discharged from [facility] [place]  Multiple UTI since then after foley removed  Here for inability to void, as well as burning upon urination

## 2019-06-11 NOTE — ED Notes (Addendum)
Spouse at bedside
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
## 2019-06-11 NOTE — ED Notes (Signed)
Report to Tracy, RN.

## 2019-06-11 NOTE — ED Notes (Signed)
Performed bladder scan on patient. Amount of urine recorded was 36ml

## 2019-06-11 NOTE — ED Notes (Signed)
Lab attempting draw

## 2019-06-11 NOTE — ED Notes (Signed)
covid spec to lab 

## 2019-06-12 ENCOUNTER — Encounter (HOSPITAL_COMMUNITY): Payer: Self-pay | Admitting: Internal Medicine

## 2019-06-12 ENCOUNTER — Inpatient Hospital Stay (HOSPITAL_COMMUNITY): Payer: Medicare Other

## 2019-06-12 ENCOUNTER — Other Ambulatory Visit: Payer: Self-pay

## 2019-06-12 LAB — CBC
HCT: 23.9 % — ABNORMAL LOW (ref 39.0–52.0)
Hemoglobin: 7.4 g/dL — ABNORMAL LOW (ref 13.0–17.0)
MCH: 30.6 pg (ref 26.0–34.0)
MCHC: 31 g/dL (ref 30.0–36.0)
MCV: 98.8 fL (ref 80.0–100.0)
Platelets: 83 10*3/uL — ABNORMAL LOW (ref 150–400)
RBC: 2.42 MIL/uL — ABNORMAL LOW (ref 4.22–5.81)
RDW: 14.6 % (ref 11.5–15.5)
WBC: 4.3 10*3/uL (ref 4.0–10.5)
nRBC: 0.5 % — ABNORMAL HIGH (ref 0.0–0.2)

## 2019-06-12 LAB — BASIC METABOLIC PANEL
Anion gap: 13 (ref 5–15)
BUN: 69 mg/dL — ABNORMAL HIGH (ref 6–20)
CO2: 27 mmol/L (ref 22–32)
Calcium: 8.4 mg/dL — ABNORMAL LOW (ref 8.9–10.3)
Chloride: 99 mmol/L (ref 98–111)
Creatinine, Ser: 9.98 mg/dL — ABNORMAL HIGH (ref 0.61–1.24)
GFR calc Af Amer: 6 mL/min — ABNORMAL LOW (ref >60)
GFR calc non Af Amer: 5 mL/min — ABNORMAL LOW (ref >60)
Glucose, Bld: 89 mg/dL (ref 70–99)
Potassium: 4.3 mmol/L (ref 3.5–5.1)
Sodium: 139 mmol/L (ref 135–145)

## 2019-06-12 LAB — ABO/RH: ABO/RH(D): A POS

## 2019-06-12 LAB — GLUCOSE, CAPILLARY
Glucose-Capillary: 118 mg/dL — ABNORMAL HIGH (ref 70–99)
Glucose-Capillary: 133 mg/dL — ABNORMAL HIGH (ref 70–99)
Glucose-Capillary: 145 mg/dL — ABNORMAL HIGH (ref 70–99)
Glucose-Capillary: 66 mg/dL — ABNORMAL LOW (ref 70–99)
Glucose-Capillary: 77 mg/dL (ref 70–99)
Glucose-Capillary: 79 mg/dL (ref 70–99)

## 2019-06-12 LAB — SARS CORONAVIRUS 2 (TAT 6-24 HRS): SARS Coronavirus 2: NEGATIVE

## 2019-06-12 MED ORDER — SODIUM CHLORIDE 0.9 % IV SOLN: INTRAVENOUS | Status: AC

## 2019-06-12 MED ORDER — CHLORHEXIDINE GLUCONATE CLOTH 2 % EX PADS
6.0000 | MEDICATED_PAD | Freq: Every day | CUTANEOUS | Status: DC
  Administered 2019-06-12 – 2019-06-17 (×4): 6 via TOPICAL

## 2019-06-12 NOTE — Progress Notes (Signed)
PROGRESS NOTE    [name]  [id number] DOB: 11/16/1965 DOA: 06/11/2019 PCP: Sharion Balloon, FNP    Assessment & Plan:   Active Problems:   Acute-on-chronic kidney injury [facility])  [name] is a 53 y.o. male with medical history significant for  CKD stage V,chronic respiratory failure on 3 L O2, OSA/OHS, diabetes mellitus, pulmonary embolism DVT, bipolar disorder.  Patient presented to the ED with complaints of inability to void.   Acute on chronic kidney disease stage 5 Anuria  -creatinine 9.42 on presentation, recent discharge creatinine 5.4-  5.8.  Had been taking 80 mg of Lasix twice daily, poor p.o. intake.  Likely prerenal component to acute kidney injury with advancing renal failure.  Weights per chart stable.  Renal stone study Marked chronic atrophy left kidney, Hemorrhage into a right renal lower pole cysts, or dilated calyx, Right renal collecting system is mildly dilated. Ureteral dilation, however, no renal or ureteral stones. Appearance is similar to the recent prior CT. - EDP talked to on-call nephrologist Dr. Jonnie Finner, recommended IV fluids, 500 mill normal saline bolus given, continue normal saline 100cc/hr on presentation. - EDP to urology Dr. Louis Meckel, no specific urologic intervention at this time.  Recommends resuscitation. PLAN: --Continue MIVF@100  for 10 hours --Strict I/O --Rep-consult nephrology in a.m --Hold home Lasix --Order continuous pulse ox to monitor for pulm edema    Acute on chronic anemia, thrombocytopenia  -hemoglobin 6.9, recent discharge hemoglobin 7.1 recent hgb baseline 7- 8.  Likely anemia of chronic disease- renal failure.  Platelets 90, baseline 65-70.   - s/p 1u prbc  Metabolic encephalopathy likely 2/2 uremia, improved -Hold home psychoactive agents-buspirone, Cymbalta, narcotics, Lyrica -Resume home Keppra, lamotrigine -Trial of IV fluids  Recent urinary tract infection -Resume and complete post discharge 7-day  course of Augmentin  Prolonged QTC-505.  Potassium 4.1. Mag 2.5. Home medications include buspirone, Cymbalta.  History of pulmonary embolism, DVT-INR  2.8 . On anticoagulation with warfarin.  With chronic gradually worsening thrombocytopenia and anemia. -PT/INR in a.m. -Will hold warfarin fornow, may need HD access.  Seizure history-  -Resume home Keppra,Lamictal, Lyrica  Uncontrolled diabetes mellitus-random glucose 128. 04/10/19 Hgba1c7.9. -Resume Lantus at reduced dose45 u BID,  -Hold home premeal insulin, monthly emgality,weekly Trulicity,Jardiance. - SSI  Chronic respiratory failure on home 3L O2 OSA and OHS --continue suppl O2 --Order continuous pulse ox  Morbid obesity BMI54  Depression bipolar disorder -Hold home buspirone, Cymbalta, Lyrica  Chronic pain syndrome -Hold home oral PRN opioids due to increased lethargy and now poor kidney clearance --Pt currently has a Fentanyl patch on.  Will remove if pt develops excessive somnolence or respiratory depression   DVT prophylaxis: SCDs for now, in case HD access needed. Code Status: Full code, spouse reports patient has a DNR form at home, but spouse is not sure if patient has changed his mind on his CODE STATUS.   Family Communication: Updated wife at bedside Disposition Plan: > 3 days Consults called: reconsult nephrology in the morning Admission status: Inpatient, telemetry   Subjective and Interval History:  Pt complained of right anterior thigh pain and right knee pain, from his recent fall.  Reported swelling in his left forearm and right hand.  On 4L [place] and respiratory status was at baseline.  Has not put out any urine since presentation.  No fever, chest pain, abdominal pain, N/V/D.  Normal BM.   Objective: Vitals:   06/12/19 0203 06/12/19 0603 06/12/19 2022 06/12/19 2133  BP: 123/64 Marland Kitchen)  121/52  107/86  Pulse: 72 70  (!) 110  Resp: 16 20  18   Temp: 98.3 F (36.8 C) 98.4 F (36.9 C)   98.2 F (36.8 C)  TempSrc: Oral Oral  Oral  SpO2: 96% 97% 97% 91%  Weight:      Height:        Intake/Output Summary (Last 24 hours) at 06/12/2019 2359 Last data filed at 06/12/2019 0500 Gross per 24 hour  Intake 1498.33 ml  Output --  Net 1498.33 ml   Filed Weights   06/11/19 1405 06/11/19 1412  Weight: (!) 190.5 kg (!) 190.5 kg    Examination:   Constitutional: NAD, AAOx3, obese HEENT: conjunctivae and lids normal, EOMI CV: RRR faint. Distal pulses +2.  No cyanosis.   RESP: CTA B/L over anterior, normal respiratory effort, on 4L  GI: +BS, NTND Extremities: Non-pitting swelling in BLE.  Right knee swollen with large patch of bruising over the knee cap. SKIN: warm, dry and intact Neuro: II - XII grossly intact.  Sensation intact   Data Reviewed: I have personally reviewed following labs and imaging studies  CBC: Recent Labs  Lab 06/07/19 0634 06/08/19 0541 06/08/19 1356 06/11/19 1615 06/12/19 0508  WBC 4.0 3.0* 3.0* 4.5 4.3  NEUTROABS  --   --   --  2.8  --   HGB 7.3* 7.1* 7.1* 6.9* 7.4*  HCT 23.9* 23.8* 23.0* 22.5* 23.9*  MCV 98.0 99.2 97.5 98.7 98.8  PLT 65* 66* 70* 90* 83*   Basic Metabolic Panel: Recent Labs  Lab 06/07/19 0634 06/08/19 0541 06/08/19 1356 06/11/19 1615 06/12/19 0508  NA 137 137 138 136 139  K 3.8 3.8 3.6 4.1 4.3  CL 94* 95* 96* 95* 99  CO2 30 29 29 25 27   GLUCOSE 185* 194* 159* 128* 89  BUN 54* 57* 54* 65* 69*  CREATININE 5.56* 5.85* 5.45* 9.42* 9.98*  CALCIUM 8.2* 8.2* 8.3* 8.3* 8.4*  MG  --   --   --  2.5*  --   PHOS 7.1* 6.3* 5.9*  --   --    GFR: Estimated Creatinine Clearance: 15.5 mL/min (A) (by C-G formula based on SCr of 9.98 mg/dL (H)). Liver Function Tests: Recent Labs  Lab 06/07/19 0634 06/08/19 0541 06/08/19 1356 06/11/19 1615  AST  --   --   --  19  ALT  --   --   --  10  ALKPHOS  --   --   --  59  BILITOT  --   --   --  0.8  PROT  --   --   --  6.9  ALBUMIN 2.3* 2.3* 2.3* 2.4*   Recent Labs  Lab  06/11/19 1615  LIPASE 25   Recent Labs  Lab 06/11/19 1615  AMMONIA 23   Coagulation Profile: Recent Labs  Lab 06/06/19 0556 06/07/19 0634 06/08/19 0541 06/11/19 1615  INR 2.9* 2.1* 1.8* 2.8*   Cardiac Enzymes: No results for input(s): CKTOTAL, CKMB, CKMBINDEX, TROPONINI in the last 168 hours. BNP (last 3 results) No results for input(s): PROBNP in the last 8760 hours. HbA1C: No results for input(s): HGBA1C in the last 72 hours. CBG: Recent Labs  Lab 06/12/19 0754 06/12/19 1109 06/12/19 1345 06/12/19 1649 06/12/19 2135  GLUCAP 77 66* 118* 133* 145*   Lipid Profile: No results for input(s): CHOL, HDL, LDLCALC, TRIG, CHOLHDL, LDLDIRECT in the last 72 hours. Thyroid Function Tests: No results for input(s): TSH, T4TOTAL, FREET4, T3FREE, THYROIDAB in  the last 72 hours. Anemia Panel: No results for input(s): VITAMINB12, FOLATE, FERRITIN, TIBC, IRON, RETICCTPCT in the last 72 hours. Sepsis Labs: No results for input(s): PROCALCITON, LATICACIDVEN in the last 168 hours.  Recent Results (from the past 240 hour(s))  Urine culture     Status: None   Collection Time: 06/04/19 12:08 PM   Specimen: Urine, Catheterized  Result Value Ref Range Status   Specimen Description   Final    URINE, CATHETERIZED Performed at [facility], [address], [place], [place] [address]    Special Requests   Final    NONE Performed at [facility] [address], [place], [place] [address]    Culture   Final    NO GROWTH Performed at [facility], [place] [address], [place], [place] [address]    Report Status 06/06/2019 FINAL  Final  SARS CORONAVIRUS 2 (TAT 6-24 HRS) Nasopharyngeal Nasopharyngeal Swab     Status: None   Collection Time: 06/11/19  6:23 PM   Specimen: Nasopharyngeal Swab  Result Value Ref Range Status   SARS Coronavirus 2 NEGATIVE NEGATIVE Final    Comment: (NOTE) SARS-CoV-2 target nucleic acids are NOT DETECTED. The SARS-CoV-2 RNA is generally detectable in upper  and lower respiratory specimens during the acute phase of infection. Negative results do not preclude SARS-CoV-2 infection, do not rule out co-infections with other pathogens, and should not be used as the sole basis for treatment or other patient management decisions. Negative results must be combined with clinical observations, patient history, and epidemiological information. The expected result is Negative. Fact Sheet for Patients: SugarRoll.be Fact Sheet for Healthcare Providers: https://www.woods-mathews.com/ This test is not yet approved or cleared by the [place] FDA and  has been authorized for detection and/or diagnosis of SARS-CoV-2 by FDA under an Emergency Use Authorization (EUA). This EUA will remain  in effect (meaning this test can be used) for the duration of the COVID-19 declaration under Section 56 4(b)(1) of the Act, 21 U.S.C. section 360bbb-3(b)(1), unless the authorization is terminated or revoked sooner. Performed at [facility], [place] [address], [place], [place] [address]       Radiology Studies: DG Knee 1-2 Views Right  Result Date: 06/12/2019 CLINICAL DATA:  Right knee trauma. EXAM: RIGHT KNEE - 1-2 VIEW COMPARISON:  None. FINDINGS: There is extensive prepatellar soft tissue swelling. There is a moderate-sized joint effusion. There are end-stage degenerative changes of the patellofemoral compartment. There are advanced degenerative changes of the medial and lateral compartments. Chondrocalcinosis is noted. There is no definite acute displaced fracture or dislocation. IMPRESSION: 1. Extensive prepatellar soft tissue swelling and moderate-sized joint effusion. 2. No definite acute displaced fracture or dislocation. If there is high clinical suspicion for an occult fracture, follow-up with CT is recommended. 3. End-stage degenerative changes of the patellofemoral compartment. 4. Chondrocalcinosis. Electronically  Signed   By: Constance Holster M.D.   On: 06/12/2019 19:09   CT Head Wo Contrast  Result Date: 06/11/2019 CLINICAL DATA:  Recent fall with headaches EXAM: CT HEAD WITHOUT CONTRAST CT CERVICAL SPINE WITHOUT CONTRAST TECHNIQUE: Multidetector CT imaging of the head and cervical spine was performed following the standard protocol without intravenous contrast. Multiplanar CT image reconstructions of the cervical spine were also generated. COMPARISON:  None. FINDINGS: CT HEAD FINDINGS Brain: No evidence of acute infarction, hemorrhage, hydrocephalus, extra-axial collection or mass lesion/mass effect. Vascular: No hyperdense vessel or unexpected calcification. Skull: Normal. Negative for fracture or focal lesion. Sinuses/Orbits: No acute  finding. Other: None. CT CERVICAL SPINE FINDINGS Alignment: Alignment is within normal limits although severe artifact is noted below the C5 vertebral body. The posterior elements of C6 and C7 are well visualized. Skull base and vertebrae: 7 cervical segments are visualized although the vertebral body at C6 and C7 is incompletely evaluated due to significant artifact. No acute fracture or acute facet abnormality is noted. Soft tissues and spinal canal: Surrounding soft tissue structures are within normal limits. Upper chest: Mild interstitial changes are seen. Other: None IMPRESSION: CT of the head: No acute intracranial abnormality noted. CT of the cervical spine: Significantly limited exam secondary to the patient's body habitus and inability to adequately position himself. No acute abnormality is noted. Electronically Signed   By: Inez Catalina M.D.   On: 06/11/2019 16:34   CT Cervical Spine Wo Contrast  Result Date: 06/11/2019 CLINICAL DATA:  Recent fall with headaches EXAM: CT HEAD WITHOUT CONTRAST CT CERVICAL SPINE WITHOUT CONTRAST TECHNIQUE: Multidetector CT imaging of the head and cervical spine was performed following the standard protocol without intravenous contrast.  Multiplanar CT image reconstructions of the cervical spine were also generated. COMPARISON:  None. FINDINGS: CT HEAD FINDINGS Brain: No evidence of acute infarction, hemorrhage, hydrocephalus, extra-axial collection or mass lesion/mass effect. Vascular: No hyperdense vessel or unexpected calcification. Skull: Normal. Negative for fracture or focal lesion. Sinuses/Orbits: No acute finding. Other: None. CT CERVICAL SPINE FINDINGS Alignment: Alignment is within normal limits although severe artifact is noted below the C5 vertebral body. The posterior elements of C6 and C7 are well visualized. Skull base and vertebrae: 7 cervical segments are visualized although the vertebral body at C6 and C7 is incompletely evaluated due to significant artifact. No acute fracture or acute facet abnormality is noted. Soft tissues and spinal canal: Surrounding soft tissue structures are within normal limits. Upper chest: Mild interstitial changes are seen. Other: None IMPRESSION: CT of the head: No acute intracranial abnormality noted. CT of the cervical spine: Significantly limited exam secondary to the patient's body habitus and inability to adequately position himself. No acute abnormality is noted. Electronically Signed   By: Inez Catalina M.D.   On: 06/11/2019 16:34   CT Renal Stone Study  Result Date: 06/11/2019 CLINICAL DATA:  Fall yesterday, rt hip pain. Pt [facility] discharged from [facility] Multiple UTI since then after foley removed. Here for inability to void, as well as burning upon urination. History of DM, Migraine, seizure, CKD, DVT, PE. Pt unable to hold arms up for abd images due to prior shoulder surgery. EXAM: CT ABDOMEN AND PELVIS WITHOUT CONTRAST TECHNIQUE: Multidetector CT imaging of the abdomen and pelvis was performed following the standard protocol without IV contrast. COMPARISON:  06/04/2019 FINDINGS: Lower chest: There mild hazy opacities at the lung bases as well as linear areas of presumed atelectasis  or scarring, similar to the prior CT allowing artifact from overlying arms on the current exam. No new lung base abnormalities. Hepatobiliary: Liver demonstrates central volume loss and relative enlargement of the lateral segment left lobe, findings suggesting cirrhosis. No liver mass or focal lesion. Status post cholecystectomy. No bile duct dilation Pancreas: Significant fatty replacement. No defined mass or inflammation. Spleen: Enlarged, 20 x 7.8 x 21 cm. This is stable from recent exam. No splenic mass or focal lesion. Adrenals/Urinary Tract: No adrenal mass. Small peripherally calcified cysts reflecting residual left kidney, unchanged. Right kidney shows mild dilation of the intrarenal collecting system. Collecting system contents demonstrate increased attenuation similar to prior CT. There  is a mass in the lower pole with peripheral increased attenuation. Mass measures 4.7 x 3.4 x 3.2 cm. The mass corresponds to a complex cyst noted on ultrasound dated 05/11/2019. Findings are consistent hemorrhage into the cyst, which is communicating with the collecting system. No other evidence of mass. No stones. Right ureters normal in course and in caliber. No stones. Bladder is decompressed with a Foley catheter Stomach/Bowel: Stomach is unremarkable. Small bowel and colon are normal in caliber. No wall thickening. No inflammation. Appendix not visualized. No evidence of appendicitis. Vascular/Lymphatic: Mild aortic atherosclerotic calcifications. Enlarged splenic and portal veins. Prominent to mildly enlarged lymph nodes adjacent to the portal vein, largest measuring 1.4 cm in short axis. No other enlarged lymph nodes. Reproductive: Unremarkable. Other: No ascites.  No abdominal wall hernia. Musculoskeletal: No fracture or acute finding. No osteoblastic or osteolytic lesions. IMPRESSION: 1. Hemorrhage into a right renal lower pole cysts, or dilated calyx, which is communicating with the right intrarenal collecting  system. Right renal collecting system is mildly dilated. There is no ureteral dilation, however, no renal or ureteral stones. This appearance is similar to the recent prior CT. 2. Hazy opacities at the lung bases consistent residual COVID-19 infection, also stable from the recent prior abdomen and pelvis CT. 3. 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 4. Marked chronic atrophy of the left kidney with residual peripherally calcified cysts, stable. 5. Mild aortic atherosclerosis. 6. No fracture of the visualized skeletal structures, which includes the right proximal femur to the intertrochanteric region. Electronically Signed   By: Lajean Manes M.D.   On: 06/11/2019 16:59     Scheduled Meds: . amoxicillin-clavulanate  1 tablet Oral BID  . Chlorhexidine Gluconate Cloth  6 each Topical Daily  . finasteride  5 mg Oral Daily  . insulin aspart  0-9 Units Subcutaneous TID WC  . insulin glargine  45 Units Subcutaneous BID  . lamoTRIgine  200 mg Oral BID  . levETIRAcetam  1,000 mg Oral BID  . simvastatin  20 mg Oral QHS  . tamsulosin  0.4 mg Oral Daily   Continuous Infusions: . sodium chloride       LOS: 1 day     Enzo Bi, MD [facility] If 7PM-7AM, please contact night-coverage 06/12/2019, 11:59 PM

## 2019-06-12 NOTE — Progress Notes (Signed)
Patient has finished his one unit of RBC per order. Patient's vitals are stable and is resting at this time. I will continue to monitor patient.

## 2019-06-12 NOTE — Progress Notes (Signed)
Called wife [name] to let her know that she can come visit [name]. Left VM

## 2019-06-12 NOTE — TOC Initial Note (Signed)
Transition of Care [facility]) - Initial/Assessment Note    Patient Details  Name: [name] MRN: [id number] Date of Birth: 05/10/1966  [facility]) CM/SW Contact:    Micael Barb, Chauncey Reading, RN Phone Number: 06/12/2019, 10:56 AM  Clinical Narrative:  Patient high risk readmission due to 18% Number of ED visits in last six months 6  18% Number of active Rx orders     53 y.o. male with medical history significant for  CKD stage V,chronic respiratory failure on 3 L O2, OSA/OHS, diabetes mellitus, pulmonary embolism DVT, bipolar disorder.    Patient assessed less than a week ago, "[name] reports that patient resides at home alone. [name] adds that she provides much assistance for patient in doing ADLs including bathing, dressing, and assistance with toileting.   Pt is currently being following by [facility] care and receives PT 2x weekly and RN visits 1x weekly. Pt has the following DME in the home: power chair, bedside commode. Family is interested in receiving a sliding board during the visit.   Pt uses transportation through his insurance to attend medical appointments. "  TOC to follow and address any new needs. Will resume home health at time of DC.     Expected Discharge Plan: [place] Barriers to Discharge: Continued Medical Work up   Patient Goals and CMS Choice Patient states their goals for this hospitalization and ongoing recovery are:: return home CMS Medicare.gov Compare Post Acute Care list provided to:: Patient Choice offered to / list presented to : Spouse  Expected Discharge Plan and Services Expected Discharge Plan: [place]   Discharge Planning Services: CM Consult Post Acute Care Choice: Home Health, Resumption of Svcs/PTA Provider Living arrangements for the past 2 months: [place]: [place]        Prior Living  Arrangements/Services Living arrangements for the past 2 months: Single Family Home Lives with:: Spouse Patient language and need for interpreter reviewed:: Yes Do you feel safe going back to the place where you live?: Yes      Need for Family Participation in Patient Care: Yes (Comment) Care giver support system in place?: Yes (comment) Current home services: DME, Home OT, Home PT    Activities of Daily Living Home Assistive Devices/Equipment: Electric scooter ADL Screening (condition at time of admission) Patient's cognitive ability adequate to safely complete daily activities?: Yes Is the patient deaf or have difficulty hearing?: No Does the patient have difficulty seeing, even when wearing glasses/contacts?: No Does the patient have difficulty concentrating, remembering, or making decisions?: No Patient able to express need for assistance with ADLs?: Yes Does the patient have difficulty dressing or bathing?: Yes Independently performs ADLs?: No Communication: Independent Dressing (OT): Needs assistance Is this a change from baseline?: Pre-admission baseline Grooming: Needs assistance Is this a change from baseline?: Pre-admission baseline Feeding: Needs assistance Is this a change from baseline?: Pre-admission baseline Bathing: Needs assistance Is this a change from baseline?: Pre-admission baseline Toileting: Needs assistance Is this a change from baseline?: Pre-admission baseline In/Out Bed: Dependent Is this a change from baseline?: Pre-admission baseline Walks in Home: Dependent Is this a change from baseline?: Pre-admission baseline Does the patient have difficulty walking or climbing stairs?: Yes Weakness of Legs: Both Weakness of Arms/Hands: Both  Permission Sought/Granted      Share Information with NAME: [name] -wife  Permission granted to share info w AGENCY: [facility]        Emotional Assessment              Admission diagnosis:  Somnolence  [R40.0] Acute-on-chronic kidney injury ([place]) [N17.9, N18.9] Acute renal failure, unspecified acute renal failure type ([place]) [N17.9] Anemia, unspecified type [D64.9] Patient Active Problem List   Diagnosis Date Noted  . Acute-on-chronic kidney injury ([place]) 06/11/2019  . Acute pyelonephritis 06/05/2019  . Pyelonephritis 06/05/2019  . UTI (urinary tract infection) 06/04/2019  . Enterococcus faecalis infection 05/11/2019  . Pancytopenia ([place]) 05/11/2019  . Elevated INR 05/11/2019  . Acute renal failure superimposed on stage 4 chronic kidney disease ([place]) 05/09/2019  . Chronic respiratory failure with hypoxia ([place]) 04/12/2019  . History of pulmonary embolus (PE) 04/12/2019  . Morbid obesity with BMI of 60.0-69.9, adult ([place]) 04/12/2019  . Seizures ([place]) 04/12/2019  . Diabetes mellitus type 2, uncontrolled, with complications ([place]) [id number]  . Acute renal failure superimposed on stage 3b chronic kidney disease ([place]) 04/12/2019  . Pressure injury of skin 04/11/2019  . Pneumonia due to COVID-19 virus 04/09/2019  . Tremor 02/22/2019  . Chronic back pain 11/10/2018  . Depression 05/24/2017  . Chronic migraine without aura, intractable, with status migrainosus 04/02/2017  . Pain medication agreement signed 08/30/2015  . Opioid dependence ([place]) 08/02/2015  . Hyperlipidemia associated with type 2 diabetes mellitus ([place]) 05/31/2015  . Other vascular headache 05/03/2015  . Vitamin D deficiency 11/23/2014  . Testosterone deficiency 11/23/2014  . Intractable chronic migraine without aura 10/02/2014  . GAD (generalized anxiety disorder) 01/24/2014  . GERD (gastroesophageal reflux disease) 01/24/2014  . BPH (benign prostatic hyperplasia) 01/24/2014  . Postoperative pulmonary edema ([place]) 09/15/2012  . History of DVT (deep vein thrombosis) 09/15/2012  . Anemia 05/11/2012  . Thrombocytopenia ([place]) 05/11/2012  . Acute renal failure ([place]) 05/11/2012  . Chronic pain syndrome 05/11/2012  . Morbid  obesity ([place]) 05/10/2012  . OSA (obstructive sleep apnea) 05/10/2012  . Obesity hypoventilation syndrome ([place]) 05/10/2012  . DM2 (diabetes mellitus, type 2) ([place]) 05/10/2012  . History of pulmonary embolism 05/10/2012  . Chronic anticoagulation 05/10/2012  . Seizure disorder ([place]) 05/10/2012  . DYSPNEA 02/05/2009   PCP:  Sharion Balloon, FNP Pharmacy:   [id number] [place], [place] [address] [place] [address] Phone: [phone] Fax: [phone]     Social Determinants of Health (SDOH) Interventions    Readmission Risk Interventions Readmission Risk Prevention Plan 06/12/2019 05/10/2019  Transportation Screening Complete Complete  Medication Review Press photographer) Complete Complete  PCP or Specialist appointment within 3-5 days of discharge Complete -  [place] or Home Care Consult Complete -  SW Recovery Care/Counseling Consult Complete -  Palliative Care Screening Not Applicable Not [place] Not Applicable Not Applicable  Some recent data might be hidden

## 2019-06-12 NOTE — Progress Notes (Signed)
Pt with c/o painful IV site left forearm. Pt states arm swelling and pain associated with IV. Pt has noted left extremity swelling. IV flushes without difficulty, no obvious infiltration noted on assessment. However, unable to aspirate blood from IV. IV removed for pt comfort and due to arm swelling. Pt also c/o right mid thigh pain from fall at home. States he would like an xray. Primary RN notified.

## 2019-06-13 LAB — GLUCOSE, CAPILLARY
Glucose-Capillary: 135 mg/dL — ABNORMAL HIGH (ref 70–99)
Glucose-Capillary: 123 mg/dL — ABNORMAL HIGH (ref 70–99)
Glucose-Capillary: 160 mg/dL — ABNORMAL HIGH (ref 70–99)
Glucose-Capillary: 140 mg/dL — ABNORMAL HIGH (ref 70–99)

## 2019-06-13 LAB — BASIC METABOLIC PANEL
Anion gap: 12 (ref 5–15)
BUN: 74 mg/dL — ABNORMAL HIGH (ref 6–20)
CO2: 25 mmol/L (ref 22–32)
Calcium: 7.9 mg/dL — ABNORMAL LOW (ref 8.9–10.3)
Chloride: 100 mmol/L (ref 98–111)
Creatinine, Ser: 11.54 mg/dL — ABNORMAL HIGH (ref 0.61–1.24)
GFR calc Af Amer: 5 mL/min — ABNORMAL LOW (ref >60)
GFR calc non Af Amer: 4 mL/min — ABNORMAL LOW (ref >60)
Glucose, Bld: 145 mg/dL — ABNORMAL HIGH (ref 70–99)
Potassium: 4.4 mmol/L (ref 3.5–5.1)
Sodium: 137 mmol/L (ref 135–145)

## 2019-06-13 LAB — HEMOGLOBIN AND HEMATOCRIT, BLOOD
HCT: 21.3 % — ABNORMAL LOW (ref 39.0–52.0)
Hemoglobin: 6.7 g/dL — CL (ref 13.0–17.0)

## 2019-06-13 LAB — PHOSPHORUS: Phosphorus: 9.1 mg/dL — ABNORMAL HIGH (ref 2.5–4.6)

## 2019-06-13 LAB — PROTIME-INR
INR: 2.6 — ABNORMAL HIGH (ref 0.8–1.2)
Prothrombin Time: 28.1 seconds — ABNORMAL HIGH (ref 11.4–15.2)

## 2019-06-13 LAB — CBC
HCT: 21.4 % — ABNORMAL LOW (ref 39.0–52.0)
Hemoglobin: 6.5 g/dL — CL (ref 13.0–17.0)
MCH: 30.1 pg (ref 26.0–34.0)
MCHC: 30.4 g/dL (ref 30.0–36.0)
MCV: 99.1 fL (ref 80.0–100.0)
Platelets: 69 10*3/uL — ABNORMAL LOW (ref 150–400)
RBC: 2.16 MIL/uL — ABNORMAL LOW (ref 4.22–5.81)
RDW: 15.1 % (ref 11.5–15.5)
WBC: 3.8 10*3/uL — ABNORMAL LOW (ref 4.0–10.5)
nRBC: 0.5 % — ABNORMAL HIGH (ref 0.0–0.2)

## 2019-06-13 LAB — PREPARE RBC (CROSSMATCH)

## 2019-06-13 LAB — MAGNESIUM: Magnesium: 2.5 mg/dL — ABNORMAL HIGH (ref 1.7–2.4)

## 2019-06-13 MED ORDER — SODIUM CHLORIDE 0.9% IV SOLUTION: Freq: Once | INTRAVENOUS | Status: DC
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
## 2019-06-13 NOTE — Progress Notes (Signed)
   Request for tunneled dialysis catheter placement  Pt on coumadin secondary Hx PE INR 2.8 12/13 No coumadin since admission 12/12  Has eaten today Now npo after MN tonight  Will tentatively plan for tunneled dialysis catheter placement Orders in for RN to arrange ambulance to have pt to [facility] 12/16 by noon  Will return to [facility] after placement  I have spoken to RN

## 2019-06-13 NOTE — Progress Notes (Signed)
PROGRESS NOTE    [name]  [id number] DOB: 1966-04-29 DOA: 06/11/2019 PCP: Sharion Balloon, FNP    Assessment & Plan:   Active Problems:   Acute-on-chronic kidney injury [facility])  [name] is a 53 y.o. male with medical history significant for  CKD stage V,chronic respiratory failure on 3 L O2, OSA/OHS, diabetes mellitus, pulmonary embolism DVT, bipolar disorder.  Patient presented to the ED with complaints of inability to void.   # Acute on chronic kidney disease stage 5, anuric -creatinine 9.42 on presentation, recent discharge creatinine 5.4- 5.8.  Had been taking 80 mg of Lasix twice daily, poor p.o. intake.  Likely prerenal component to acute kidney injury with advancing renal failure.  CT without evidence of hydronephrosis and renal function continues to decline.  He was lethargic and has asterixis on exam. - EDP talked to on-call nephrologist Dr. Jonnie Finner, recommended IV fluids, 500 ml normal saline bolus given, f/b normal saline 100cc/hr on presentation. - EDP to urology Dr. Louis Meckel, no specific urologic intervention at this time. PLAN: --Nephrology consult and saw pt today --IR to place tunneled cath tomorrow 12/16 at [facility] given his body habitus --HD after cath placement --d/c MIVF --continuous pulse ox to monitor for pulm edema  --Transfer request placed to [facility]  # Gross hematuria, POA - ct scan revealed hemorrhagic cyst.  Foley catheter in place and able to flush but no uop.  Consider replacing to 3 way foley for bladder irrigation  # Acute on chronic anemia # thrombocytopenia  -hemoglobin 6.9 on presentation, recent hgb baseline 7- 8.  Likely anemia of chronic disease- renal failure.  Platelets 90, baseline 65-70.   - s/p 2u pRBC  --Transfuse to keep Hgb >=7  Metabolic and toxic encephalopathy likely 2/2 uremia and sedative, improved --Lethargic but arousable.  He was on multiple medications including narcotics and lyrica.  Has an  Fentanyl patch 50 on that is due to be replaced tomorrow 12/16. PLAN: --Will keep Fentanyl patch on for now, since pt is still complaining of a lot of pain.  Will remove if pt develops excessive somnolence or respiratory depression --No additional PRN opioids pain meds -Hold home buspirone, Cymbalta, narcotics, Lyrica -continue home Keppra, lamotrigine  Recent urinary tract infection, POA -continue 7-day of Augmentin prescribed at last discharge on 06/08/19, last day 12/16  Prolonged QTC-505.   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 On anticoagulation with warfarin.   -hold warfarin fornow, 2/2 upcoming tunneled cath placement --Resume warfarin after cath placement  Seizure history-  -continue home Keppra,Lamictal  diabetes mellitus on insulin 04/10/19 Hgba1c7.9. -Resume Lantus at reduced dose45 u BID,  -Hold home premeal insulin, monthly emgality,weekly Trulicity,Jardiance. - SSI  # Chronic respiratory failure on home 3L O2 # OSA and OHS --continue suppl O2 --continuous pulse ox  Morbid obesity BMI54  Depression bipolar disorder -Hold home buspirone, Cymbalta, Lyrica  Chronic pain syndrome -Hold home oral PRN opioids due to increased lethargy and now poor kidney clearance --Pt currently has a Fentanyl patch 50 on, due for exchange on 12/16. Will remove if pt develops excessive somnolence or respiratory depression   DVT prophylaxis: SCDs for now, in case HD access needed. Code Status: Full code, spouse reports patient has a DNR form at home, but spouse is not sure if patient has changed his mind on his CODE STATUS.   Family Communication: Updated wife at bedside Consults called: nephrology  Disposition  Plan: > 3 days.  Transfer order placed to [facility].     Subjective and Interval History:  Pt complained of right hip pain today, and inability to move his right leg.  Pt didn't feel  short of breath.  No fever, chest pain, abdominal pain, N/V/D.  Has been on MIVF with only minimum amount of bloody urine output.    Nephrology consulted today and recommended inserting dialysis cath over at [facility] given pt's large body habitus.  Pt and wife both would like to be transferred to [facility].  Transfer order placed, and pt was checked out to the accepting hospitalist at [facility].   Objective: Vitals:   06/13/19 1257 06/13/19 1322 06/13/19 1534 06/13/19 2031  BP: 117/60 133/64 126/70 119/70  Pulse: 75  77 83  Resp: (!) 22 (!) 22 18 19   Temp: 98.2 F (36.8 C) 99 F (37.2 C) 98.3 F (36.8 C) 98.1 F (36.7 C)  TempSrc: Oral Oral Oral Oral  SpO2: 91% 94% 98% 95%  Weight:      Height:        Intake/Output Summary (Last 24 hours) at 06/13/2019 2205 Last data filed at 06/13/2019 1700 Gross per 24 hour  Intake 820 ml  Output 25 ml  Net 795 ml   Filed Weights   06/11/19 1405 06/11/19 1412  Weight: (!) 190.5 kg (!) 190.5 kg    Examination:   Constitutional: NAD, oriented, morbidly obese, appeared sleepy and frequently closed his eyes during breaks in conversation HEENT: conjunctivae and lids normal, EOMI CV: RRR faint. Distal pulses +2.  No cyanosis.   RESP: CTA B/L over anterior, normal respiratory effort, on 4L  GI: +BS, NTND Extremities: Non-pitting swelling in BLE.  Right knee swollen with large patch of bruising over the knee cap. SKIN: warm, dry and intact Neuro: II - XII grossly intact.  Sensation intact   Data Reviewed: I have personally reviewed following labs and imaging studies  CBC: Recent Labs  Lab 06/08/19 0541 06/08/19 1356 06/11/19 1615 06/12/19 0508 06/13/19 0824 06/13/19 1718  WBC 3.0* 3.0* 4.5 4.3 3.8*  --   NEUTROABS  --   --  2.8  --   --   --   HGB 7.1* 7.1* 6.9* 7.4* 6.5* 6.7*  HCT 23.8* 23.0* 22.5* 23.9* 21.4* 21.3*  MCV 99.2 97.5 98.7 98.8 99.1  --   PLT 66* 70* 90* 83* 69*  --    Basic Metabolic Panel: Recent Labs  Lab 06/07/19  0634 06/08/19 0541 06/08/19 1356 06/11/19 1615 06/12/19 0508 06/13/19 0824  NA 137 137 138 136 139 137  K 3.8 3.8 3.6 4.1 4.3 4.4  CL 94* 95* 96* 95* 99 100  CO2 30 29 29 25 27 25   GLUCOSE 185* 194* 159* 128* 89 145*  BUN 54* 57* 54* 65* 69* 74*  CREATININE 5.56* 5.85* 5.45* 9.42* 9.98* 11.54*  CALCIUM 8.2* 8.2* 8.3* 8.3* 8.4* 7.9*  MG  --   --   --  2.5*  --  2.5*  PHOS 7.1* 6.3* 5.9*  --   --  9.1*   GFR: Estimated Creatinine Clearance: 13.4 mL/min (A) (by C-G formula based on SCr of 11.54 mg/dL (H)). Liver Function Tests: Recent Labs  Lab 06/07/19 [id number] 06/08/19 0541 06/08/19 1356 06/11/19 1615  AST  --   --   --  19  ALT  --   --   --  10  ALKPHOS  --   --   --  59  BILITOT  --   --   --  0.8  PROT  --   --   --  6.9  ALBUMIN 2.3* 2.3* 2.3* 2.4*   Recent Labs  Lab 06/11/19 1615  LIPASE 25   Recent Labs  Lab 06/11/19 1615  AMMONIA 23   Coagulation Profile: Recent Labs  Lab 06/07/19 0634 06/08/19 0541 06/11/19 1615 06/13/19 1158  INR 2.1* 1.8* 2.8* 2.6*   Cardiac Enzymes: No results for input(s): CKTOTAL, CKMB, CKMBINDEX, TROPONINI in the last 168 hours. BNP (last 3 results) No results for input(s): PROBNP in the last 8760 hours. HbA1C: No results for input(s): HGBA1C in the last 72 hours. CBG: Recent Labs  Lab 06/12/19 2135 06/13/19 0742 06/13/19 1107 06/13/19 1613 06/13/19 2028  GLUCAP 145* 135* 123* 160* 140*   Lipid Profile: No results for input(s): CHOL, HDL, LDLCALC, TRIG, CHOLHDL, LDLDIRECT in the last 72 hours. Thyroid Function Tests: No results for input(s): TSH, T4TOTAL, FREET4, T3FREE, THYROIDAB in the last 72 hours. Anemia Panel: No results for input(s): VITAMINB12, FOLATE, FERRITIN, TIBC, IRON, RETICCTPCT in the last 72 hours. Sepsis Labs: No results for input(s): PROCALCITON, LATICACIDVEN in the last 168 hours.  Recent Results (from the past 240 hour(s))  Urine culture     Status: None   Collection Time: 06/04/19 12:08 PM    Specimen: Urine, Catheterized  Result Value Ref Range Status   Specimen Description   Final    URINE, CATHETERIZED Performed at [facility], [address] [place], [place] [address]    Special Requests   Final    NONE Performed at [facility], [address], [place], [place] [address]    Culture   Final    NO GROWTH Performed at [facility], [place] [address] [place], [place] [address]    Report Status 06/06/2019 FINAL  Final  SARS CORONAVIRUS 2 (TAT 6-24 HRS) Nasopharyngeal Nasopharyngeal Swab     Status: None   Collection Time: 06/11/19  6:23 PM   Specimen: Nasopharyngeal Swab  Result Value Ref Range Status   SARS Coronavirus 2 NEGATIVE NEGATIVE Final    Comment: (NOTE) SARS-CoV-2 target nucleic acids are NOT DETECTED. The SARS-CoV-2 RNA is generally detectable in upper and lower respiratory specimens during the acute phase of infection. Negative results do not preclude SARS-CoV-2 infection, do not rule out co-infections with other pathogens, and should not be used as the sole basis for treatment or other patient management decisions. Negative results must be combined with clinical observations, patient history, and epidemiological information. The expected result is Negative. Fact Sheet for Patients: SugarRoll.be Fact Sheet for Healthcare Providers: https://www.woods-mathews.com/ This test is not yet approved or cleared by the [place] FDA and  has been authorized for detection and/or diagnosis of SARS-CoV-2 by FDA under an Emergency Use Authorization (EUA). This EUA will remain  in effect (meaning this test can be used) for the duration of the COVID-19 declaration under Section 56 4(b)(1) of the Act, 21 U.S.C. section 360bbb-3(b)(1), unless the authorization is terminated or revoked sooner. Performed at [facility], [place] [address], [place], [place] [address]       Radiology Studies: DG Knee 1-2 Views  Right  Result Date: 06/12/2019 CLINICAL DATA:  Right knee trauma. EXAM: RIGHT KNEE - 1-2 VIEW COMPARISON:  None. FINDINGS: There is extensive prepatellar soft tissue swelling. There is a moderate-sized joint effusion. There are end-stage degenerative changes of the patellofemoral compartment. There are advanced degenerative changes of  the medial and lateral compartments. Chondrocalcinosis is noted. There is no definite acute displaced fracture or dislocation. IMPRESSION: 1. Extensive prepatellar soft tissue swelling and moderate-sized joint effusion. 2. No definite acute displaced fracture or dislocation. If there is high clinical suspicion for an occult fracture, follow-up with CT is recommended. 3. End-stage degenerative changes of the patellofemoral compartment. 4. Chondrocalcinosis. Electronically Signed   By: Constance Holster M.D.   On: 06/12/2019 19:09     Scheduled Meds: . sodium chloride   Intravenous Once  . amoxicillin-clavulanate  1 tablet Oral BID  . Chlorhexidine Gluconate Cloth  6 each Topical Daily  . finasteride  5 mg Oral Daily  . insulin aspart  0-9 Units Subcutaneous TID WC  . insulin glargine  45 Units Subcutaneous BID  . lamoTRIgine  200 mg Oral BID  . levETIRAcetam  1,000 mg Oral BID  . simvastatin  20 mg Oral QHS  . tamsulosin  0.4 mg Oral Daily   Continuous Infusions:    LOS: 2 days     Enzo Bi, MD [facility] If 7PM-7AM, please contact night-coverage 06/13/2019, 10:05 PM

## 2019-06-13 NOTE — Progress Notes (Signed)
Patient has only had 30cc of bloody output from foley.  Notified MD on call.  No new orders received at this time.  Patient otherwise stable.

## 2019-06-14 ENCOUNTER — Inpatient Hospital Stay (HOSPITAL_COMMUNITY): Payer: Medicare Other

## 2019-06-14 ENCOUNTER — Ambulatory Visit: Payer: Medicare Other | Admitting: Family

## 2019-06-14 ENCOUNTER — Ambulatory Visit: Payer: Medicare Other | Admitting: *Deleted

## 2019-06-14 DIAGNOSIS — IMO0002 Reserved for concepts with insufficient information to code with codable children: Secondary | ICD-10-CM

## 2019-06-14 DIAGNOSIS — Z7901 Long term (current) use of anticoagulants: Secondary | ICD-10-CM

## 2019-06-14 DIAGNOSIS — R400 Somnolence: Secondary | ICD-10-CM

## 2019-06-14 DIAGNOSIS — E1169 Type 2 diabetes mellitus with other specified complication: Secondary | ICD-10-CM

## 2019-06-14 DIAGNOSIS — E785 Hyperlipidemia, unspecified: Secondary | ICD-10-CM

## 2019-06-14 DIAGNOSIS — E1165 Type 2 diabetes mellitus with hyperglycemia: Secondary | ICD-10-CM

## 2019-06-14 DIAGNOSIS — D649 Anemia, unspecified: Secondary | ICD-10-CM

## 2019-06-14 DIAGNOSIS — N185 Chronic kidney disease, stage 5: Secondary | ICD-10-CM | POA: Insufficient documentation

## 2019-06-14 HISTORY — PX: IR FLUORO GUIDE CV LINE RIGHT: IMG2283

## 2019-06-14 HISTORY — PX: IR US GUIDE VASC ACCESS RIGHT: IMG2390

## 2019-06-14 LAB — BPAM RBC
Blood Product Expiration Date: 202012232359
Blood Product Expiration Date: 202012232359
ISSUE DATE / TIME: 202012132328
ISSUE DATE / TIME: 202012151300
Unit Type and Rh: 6200
Unit Type and Rh: 6200

## 2019-06-14 LAB — TYPE AND SCREEN
ABO/RH(D): A POS
Antibody Screen: NEGATIVE
Unit division: 0
Unit division: 0

## 2019-06-14 LAB — CBC
HCT: 20.7 % — ABNORMAL LOW (ref 39.0–52.0)
Hemoglobin: 6.8 g/dL — CL (ref 13.0–17.0)
MCH: 31.2 pg (ref 26.0–34.0)
MCHC: 32.9 g/dL (ref 30.0–36.0)
MCV: 95 fL (ref 80.0–100.0)
Platelets: 71 10*3/uL — ABNORMAL LOW (ref 150–400)
RBC: 2.18 MIL/uL — ABNORMAL LOW (ref 4.22–5.81)
RDW: 14.8 % (ref 11.5–15.5)
WBC: 3.9 10*3/uL — ABNORMAL LOW (ref 4.0–10.5)
nRBC: 0 % (ref 0.0–0.2)

## 2019-06-14 LAB — MAGNESIUM: Magnesium: 2.5 mg/dL — ABNORMAL HIGH (ref 1.7–2.4)

## 2019-06-14 LAB — BASIC METABOLIC PANEL
Anion gap: 17 — ABNORMAL HIGH (ref 5–15)
BUN: 76 mg/dL — ABNORMAL HIGH (ref 6–20)
CO2: 21 mmol/L — ABNORMAL LOW (ref 22–32)
Calcium: 7.8 mg/dL — ABNORMAL LOW (ref 8.9–10.3)
Chloride: 98 mmol/L (ref 98–111)
Creatinine, Ser: 13.1 mg/dL — ABNORMAL HIGH (ref 0.61–1.24)
GFR calc Af Amer: 4 mL/min — ABNORMAL LOW (ref >60)
GFR calc non Af Amer: 4 mL/min — ABNORMAL LOW (ref >60)
Glucose, Bld: 138 mg/dL — ABNORMAL HIGH (ref 70–99)
Potassium: 4.2 mmol/L (ref 3.5–5.1)
Sodium: 136 mmol/L (ref 135–145)

## 2019-06-14 LAB — GLUCOSE, CAPILLARY
Glucose-Capillary: 138 mg/dL — ABNORMAL HIGH (ref 70–99)
Glucose-Capillary: 111 mg/dL — ABNORMAL HIGH (ref 70–99)
Glucose-Capillary: 114 mg/dL — ABNORMAL HIGH (ref 70–99)
Glucose-Capillary: 98 mg/dL (ref 70–99)
Glucose-Capillary: 108 mg/dL — ABNORMAL HIGH (ref 70–99)

## 2019-06-14 LAB — PREPARE RBC (CROSSMATCH)

## 2019-06-14 MED ORDER — CHLORHEXIDINE GLUCONATE CLOTH 2 % EX PADS
6.0000 | MEDICATED_PAD | Freq: Every day | CUTANEOUS | Status: DC
  Administered 2019-06-14 – 2019-06-16 (×3): 6 via TOPICAL

## 2019-06-14 MED ORDER — SODIUM CHLORIDE 0.9% IV SOLUTION: Freq: Once | INTRAVENOUS | Status: DC

## 2019-06-14 MED ORDER — DARBEPOETIN ALFA 60 MCG/0.3ML IJ SOSY
60.0000 ug | PREFILLED_SYRINGE | INTRAMUSCULAR | Status: DC
  Filled 2019-06-14: qty 0.3

## 2019-06-14 MED ORDER — LIDOCAINE HCL 1 % IJ SOLN
INTRAMUSCULAR | Status: AC | PRN
  Administered 2019-06-14: 2 mL

## 2019-06-14 MED ORDER — SODIUM CHLORIDE 0.9 % IV SOLN
125.0000 mg | INTRAVENOUS | Status: DC
  Administered 2019-06-15 – 2019-06-17 (×4): 125 mg via INTRAVENOUS
  Filled 2019-06-14 (×7): qty 10

## 2019-06-14 MED ORDER — HEPARIN SODIUM (PORCINE) 1000 UNIT/ML IJ SOLN
INTRAMUSCULAR | Status: AC | PRN
  Administered 2019-06-14: 2.8 mL via INTRAVENOUS

## 2019-06-14 MED ORDER — FENTANYL 50 MCG/HR TD PT72
1.0000 | MEDICATED_PATCH | TRANSDERMAL | Status: DC
  Administered 2019-06-14 – 2019-07-02 (×8): 1 via TRANSDERMAL
  Filled 2019-06-14 (×8): qty 1

## 2019-06-14 MED ORDER — HEPARIN SODIUM (PORCINE) 1000 UNIT/ML IJ SOLN
INTRAMUSCULAR | Status: AC
  Filled 2019-06-14: qty 1

## 2019-06-14 MED ORDER — CALCIUM ACETATE (PHOS BINDER) 667 MG PO CAPS
667.0000 mg | ORAL_CAPSULE | Freq: Three times a day (TID) | ORAL | Status: DC
  Administered 2019-06-14 – 2019-06-15 (×3): 667 mg via ORAL
  Filled 2019-06-14 (×2): qty 1

## 2019-06-14 MED ORDER — HYDROCODONE-ACETAMINOPHEN 7.5-325 MG PO TABS
1.0000 | ORAL_TABLET | Freq: Four times a day (QID) | ORAL | Status: DC | PRN
  Administered 2019-06-14 – 2019-07-03 (×13): 1 via ORAL
  Filled 2019-06-14 (×16): qty 1

## 2019-06-14 MED ORDER — LIDOCAINE HCL 1 % IJ SOLN
INTRAMUSCULAR | Status: AC
  Filled 2019-06-14: qty 20

## 2019-06-14 NOTE — Chronic Care Management (AMB) (Addendum)
Care Management   Initial Outreach  06/14/2019 Name: [name] MRN: [id number] DOB: 1965/08/21  Referred by: Patrick Balloon, FNP Reason for referral : Chronic Care Management (RN Initial Outreach)   [name] is a 53 y.o. year old male who is a primary care patient of Patrick Balloon, FNP. The CCM team was consulted for assistance with chronic disease management and care coordination needs related to HTN, HLD, DMII, CKD Stage 5, Anxiety and Pulmonary Disease  Review of patient status, including review of consultants reports, relevant laboratory and other test results, and collaboration with appropriate care team members and the patient's provider was performed as part of comprehensive patient evaluation and provision of chronic care management services.    I spoke with Mr [name] wife by telephone today. He is currently inpatient and is starting dialysis today for CKD stage 5.   Medications: Facility-Administered Encounter Medications as of 06/14/2019  Medication   0.9 %  sodium chloride infusion (Manually program via Guardrails IV Fluids)   0.9 %  sodium chloride infusion (Manually program via Guardrails IV Fluids)   acetaminophen (TYLENOL) tablet 650 mg   Or   acetaminophen (TYLENOL) suppository 650 mg   albuterol (PROVENTIL) (2.5 MG/3ML) 0.083% nebulizer solution 3 mL   Chlorhexidine Gluconate Cloth 2 % PADS 6 each   finasteride (PROSCAR) tablet 5 mg   fluticasone (FLONASE) 50 MCG/ACT nasal spray 1 spray   insulin aspart (novoLOG) injection 0-9 Units   insulin glargine (LANTUS) injection 45 Units   lamoTRIgine (LAMICTAL) tablet 200 mg   levETIRAcetam (KEPPRA) tablet 1,000 mg   ondansetron (ZOFRAN) tablet 4 mg   Or   ondansetron (ZOFRAN) injection 4 mg   polyethylene glycol (MIRALAX / GLYCOLAX) packet 17 g   simvastatin (ZOCOR) tablet 20 mg   tamsulosin (FLOMAX) capsule 0.4 mg   Outpatient Encounter Medications as of 06/14/2019  Medication Sig Note   amoxicillin-clavulanate (AUGMENTIN) 500-125 MG tablet Take 1 tablet (500 mg total) by mouth 2 (two) times daily for 7 days.    busPIRone (BUSPAR) 15 MG tablet TAKE 1 TABLET (15 MG TOTAL) BY MOUTH 2 (TWO) TIMES DAILY. (Patient taking differently: Take 15 mg by mouth 2 (two) times daily. TAKE 1 TABLET (15 MG TOTAL) BY MOUTH 2 (TWO) TIMES DAILY.)    calcitRIOL (ROCALTROL) 0.25 MCG capsule Take 0.25 mcg by mouth daily.    cetirizine (ZYRTEC) 10 MG tablet Take 1 tablet (10 mg total) by mouth daily.    clomiPHENE (CLOMID) 50 MG tablet Take 0.5 tablets (25 mg total) by mouth every other day.    docusate sodium (COLACE) 100 MG capsule Take 1 capsule (100 mg total) by mouth 2 (two) times daily.    DULoxetine (CYMBALTA) 60 MG capsule TAKE 1 CAPSULE BY MOUTH EVERY DAY (Patient taking differently: Take 60 mg by mouth daily. )    EMGALITY 120 MG/ML SOAJ Inject 120 mg as directed every 30 (thirty) days.  04/09/2019: Around the first of the month   EPIPEN 2-PAK 0.3 MG/0.3ML SOAJ injection INJECT 0.3 MLS (0.3 MG TOTAL) INTO THE MUSCLE ONCE. AS NEEDED FOR ANAPHYLACTIC REACTION (Patient taking differently: Inject 0.3 mg into the muscle as needed. )    esomeprazole (NEXIUM) 40 MG capsule TAKE 1 CAPSULE (40 MG TOTAL) BY MOUTH DAILY AT 12 NOON.    fentaNYL (DURAGESIC) 50 MCG/HR Place 1 patch onto the skin every 3 (three) days.    finasteride (PROSCAR) 5 MG tablet Take 1 tablet (5 mg total) by  mouth daily.    fluticasone (FLONASE) 50 MCG/ACT nasal spray PLACE 1 SPRAY INTO BOTH NOSTRILS 2 (TWO) TIMES DAILY AS NEEDED FOR ALLERGIES.    furosemide (LASIX) 80 MG tablet Take 1 tablet (80 mg total) by mouth 2 (two) times daily. Hold until follow up with nephrology    HYDROcodone-acetaminophen (NORCO) 7.5-325 MG tablet Take 1 tablet by mouth every 6 (six) hours as needed for moderate pain.    Insulin Glargine, 1 Unit Dial, (TOUJEO SOLOSTAR) 300 UNIT/ML SOPN Inject 60 Units into the skin 2 (two) times daily. (Patient taking  differently: Inject 200 Units into the skin daily. )    insulin lispro (HUMALOG) 100 UNIT/ML KwikPen Inject 0.25 mLs (25 Units total) into the skin 3 (three) times daily. (Patient taking differently: Inject 50 Units into the skin 3 (three) times daily. )    lamoTRIgine (LAMICTAL) 200 MG tablet TAKE 1 TABLET BY MOUTH TWICE A DAY (Patient taking differently: Take 200 mg by mouth 2 (two) times daily. )    levETIRAcetam (KEPPRA) 1000 MG tablet Take 1 tablet (1,000 mg total) by mouth 2 (two) times daily.    NARCAN 4 MG/0.1ML LIQD nasal spray kit Place 1 spray into the nose once.     pregabalin (LYRICA) 50 MG capsule TAKE 1 CAPSULE BY MOUTH THREE TIMES A DAY (Patient taking differently: Take 50 mg by mouth 3 (three) times daily. TAKE 1 CAPSULE BY MOUTH THREE TIMES A DAY)    PROAIR HFA 108 (90 Base) MCG/ACT inhaler TAKE 2 PUFFS BY MOUTH EVERY 6 HOURS AS NEEDED FOR WHEEZE OR SHORTNESS OF BREATH (Patient taking differently: Inhale 2 puffs into the lungs every 6 (six) hours as needed for wheezing or shortness of breath. )    rizatriptan (MAXALT) 10 MG tablet May repeat in 2 hours if needed (Patient taking differently: Take 10 mg by mouth as needed. May repeat in 2 hours if needed)    simvastatin (ZOCOR) 20 MG tablet Take 1 tablet (20 mg total) by mouth at bedtime.    tamsulosin (FLOMAX) 0.4 MG CAPS capsule TAKE 1 CAPSULE BY MOUTH EVERY DAY (Patient taking differently: Take 0.4 mg by mouth daily. TAKE 1 CAPSULE BY MOUTH EVERY DAY)    TRULICITY 1.5 XU/3.8BF SOPN Inject 1.5 mg as directed once a week.  06/04/2019: On thursday   vitamin C (VITAMIN C) 500 MG tablet Take 1 tablet (500 mg total) by mouth daily.    Vitamin D, Ergocalciferol, (DRISDOL) 1.25 MG (50000 UT) CAPS capsule Take 50,000 Units by mouth once a week.    warfarin (COUMADIN) 5 MG tablet Take 1 tablet (5 mg total) by mouth daily. Take 64m daily    zinc sulfate 220 (50 Zn) MG capsule Take 1 capsule (220 mg total) by mouth 2 (two) times daily.     [DISCONTINUED] potassium chloride (K-DUR) 10 MEQ tablet Take 3 tablets (30 mEq total) by mouth daily.      Objective:   Goals Addressed   None      [name] given information about Chronic Care Management services today including:  CCM service includes personalized support from designated clinical staff supervised by his physician, including individualized plan of care and coordination with other care providers 24/7 contact phone numbers for assistance for urgent and routine care needs. Service will only be billed when office clinical staff spend 20 minutes or more in a month to coordinate care. Only one practitioner may furnish and bill the service in a calendar month. The  patient may stop CCM services at any time (effective at the end of the month) by phone call to the office staff. The patient will be responsible for cost sharing (co-pay) of up to 20% of the service fee (after annual deductible is met).  Patient did not agree to services and wishes to consider information provided before deciding about enrollment in care management services.   Follow-up Plan: The care management team will reach out to the patient again over the next 30 days.   Chong Sicilian, BSN, RN-BC Embedded Chronic Care Manager [facility] [place] [facility] Direct Dial: [phone]     I have reviewed and agree with the above documentation.   Evelina Dun, FNP

## 2019-06-14 NOTE — Progress Notes (Signed)
PROGRESS NOTE    [name]  [id number] DOB: March 12, 1966 DOA: 06/11/2019 PCP: Sharion Balloon, FNP   Interval History:   53 y.o. male has multiple medical problems including morbid obesity, OSA/OHS, h/o pulmonary embolus, seizure disorder, DM, HTN, RA, migraines, thrombocytopenia, Bipolar disorder, CKD stage 4 (baseline Cr 3.2-4.1 followed by Dr. Moshe Cipro), and multiple hospitalizations over te past 2 months including 10/11-10/27 due to covid pneumonia with acute on chronic respiratory failure, 11/11-11/16/20 due to AKI and urosepsis, then most recently 12/6-12/10/20 with dysuria and pyuria accompanied with AKI/CKD.  Since discharge, he has not been feeling well and noted significant decrease in UOP .  He did have gross hematuria prior to admission but had cleared up , patient presents with worsening creatinine, anuria, renal has been consulted, recommendation to initiate hemodialysis.  Significant events: 12/13 admitted to [facility] 12/15 transferred to [facility]. 12/15 1 unit PRBC 12/16 1 unit PRBC transfusion  Subjective:  Patient denies any complaints today, no chest pain, no shortness of breath, no fever or chills.   Assessment & Plan:   Active Problems:   Acute-on-chronic kidney injury ([place])  Acute on chronic kidney disease stage 5, anuric -creatinine 9.42 on presentation, recent discharge creatinine 5.4- 5.8.   -Does not appear to be currently in uric renal failure, as well with uremia . -Renal input greatly appreciated, plan to initiate hemodialysis, plan for [facility] to be placed by IR today . -Avoid nephrotoxic medications  Gross hematuria/hemorrhagic cyst -Urology has evaluated during previous admission, no intervention recommended currently, still having significant gross hematuria(oliguric), will flush Foley catheter with 100 cc every 4 hours. -Reconsult urology if no improvement.  Acute on chronic anemia -Anemia of chronic kidney disease, acute  blood loss anemia in the setting of gross hematuria. -Confused as needed, will receive another unit PRBC today. -Likely will need Procrit, will defer to renal.  Thrombocytopenia  -Chronic recurrent problem, in the setting of splenomegaly, so far no indication for transfusion given count today 70 1K. -Hemoglobin 6.8 today, will transfuse 1 unit PRBC.  Metabolic and toxic encephalopathy likely 2/2 uremia and sedative, improved --Lethargic but arousable.  He was on multiple medications including narcotics and lyrica.  Has an Fentanyl patch 50 on that is due to be replaced today. -Demented today, but wakes up and answer questions appropriately. -Hold home buspirone, Cymbalta, narcotics, Lyrica -continue home Keppra, lamotrigine -Mentation should improve after starting hemodialysis given he has uremia.  Recent urinary tract infection, POA -continue 7-day of Augmentin prescribed at last discharge on 06/08/19, last day 12/16  Prolonged QTC-505.   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 -continue Lantus at reduced dose45 u BID,  -Hold home premeal insulin, monthly emgality,weekly Trulicity,Jardiance. - SSI  # Chronic respiratory failure on home 3L O2 # OSA and OHS --continue suppl O2 --continuous pulse ox -Does not use CPAP at home  Morbid obesity BMI54  Depression bipolar disorder -Hold home buspirone, Cymbalta, Lyrica  Chronic pain syndrome -Hold home oral PRN opioids due to increased lethargy and now poor kidney clearance --Pt currently has a Fentanyl patch 50 on, due for exchange on 12/16. Will remove if pt develops excessive somnolence or respiratory depression   DVT prophylaxis: on warfarin  Code Status: Full code, spouse reports patient has a DNR form at home, but  spouse is not  sure if patient has changed his mind on his CODE STATUS.   Family Communication: none at bedside Consults called: nephrology  Disposition Plan: remains inpatient     Objective: Vitals:   06/13/19 2031 06/14/19 0249 06/14/19 0430 06/14/19 1001  BP: 119/70 (!) 133/55 (!) 117/45 (!) 118/51  Pulse: 83 72 71 65  Resp: 19 (!) 26 20 18   Temp: 98.1 F (36.7 C) 99.3 F (37.4 C) 98.2 F (36.8 C) (!) 97.5 F (36.4 C)  TempSrc: Oral Oral Axillary   SpO2: 95% 98% 95% 94%  Weight:      Height:        Intake/Output Summary (Last 24 hours) at 06/14/2019 1012 Last data filed at 06/14/2019 0432 Gross per 24 hour  Intake 340 ml  Output 200 ml  Net 140 ml   Filed Weights   06/11/19 1405 06/11/19 1412  Weight: (!) 190.5 kg (!) 190.5 kg    Examination:   Patient is sleepy, but wakes up and answer questions appropriately, no focal deficits, flat affect , has some asterixis . Symmetrical Chest wall movement, diminished air entry at the bases, clear to auscultation, on 4 L RRR,No Gallops,Rubs or new Murmurs, No Parasternal Heave +ve B.Sounds, Abd Soft, No tenderness, No rebound - guarding or rigidity. No Cyanosis, Clubbing, right knee swelling no new Rash or bruise     Data Reviewed: I have personally reviewed following labs and imaging studies  CBC: Recent Labs  Lab 06/08/19 1356 06/11/19 1615 06/12/19 0508 06/13/19 0824 06/13/19 1718 06/14/19 0534  WBC 3.0* 4.5 4.3 3.8*  --  3.9*  NEUTROABS  --  2.8  --   --   --   --   HGB 7.1* 6.9* 7.4* 6.5* 6.7* 6.8*  HCT 23.0* 22.5* 23.9* 21.4* 21.3* 20.7*  MCV 97.5 98.7 98.8 99.1  --  95.0  PLT 70* 90* 83* 69*  --  71*   Basic Metabolic Panel: Recent Labs  Lab 06/08/19 0541 06/08/19 1356 06/11/19 1615 06/12/19 0508 06/13/19 0824 06/14/19 0534  NA 137 138 136 139 137 136  K 3.8 3.6 4.1 4.3 4.4 4.2  CL 95* 96* 95* 99 100 98  CO2 29 29 25 27 25  21*  GLUCOSE 194* 159* 128* 89 145* 138*  BUN 57* 54* 65* 69* 74* 76*   CREATININE 5.85* 5.45* 9.42* 9.98* 11.54* 13.10*  CALCIUM 8.2* 8.3* 8.3* 8.4* 7.9* 7.8*  MG  --   --  2.5*  --  2.5* 2.5*  PHOS 6.3* 5.9*  --   --  9.1*  --    GFR: Estimated Creatinine Clearance: 11.8 mL/min (A) (by C-G formula based on SCr of 13.1 mg/dL (H)). Liver Function Tests: Recent Labs  Lab 06/08/19 0541 06/08/19 1356 06/11/19 1615  AST  --   --  19  ALT  --   --  10  ALKPHOS  --   --  59  BILITOT  --   --  0.8  PROT  --   --  6.9  ALBUMIN 2.3* 2.3* 2.4*   Recent Labs  Lab 06/11/19 1615  LIPASE 25   Recent Labs  Lab 06/11/19 1615  AMMONIA 23   Coagulation Profile: Recent Labs  Lab 06/08/19 0541 06/11/19 1615 06/13/19 1158  INR 1.8* 2.8* 2.6*   Cardiac Enzymes: No results for input(s): CKTOTAL, CKMB, CKMBINDEX, TROPONINI in the last 168 hours. BNP (last 3 results) No results for input(s): PROBNP in the last 8760 hours. HbA1C: No results for  input(s): HGBA1C in the last 72 hours. CBG: Recent Labs  Lab 06/13/19 1107 06/13/19 1613 06/13/19 2028 06/14/19 0247 06/14/19 0724  GLUCAP 123* 160* 140* 138* 111*   Lipid Profile: No results for input(s): CHOL, HDL, LDLCALC, TRIG, CHOLHDL, LDLDIRECT in the last 72 hours. Thyroid Function Tests: No results for input(s): TSH, T4TOTAL, FREET4, T3FREE, THYROIDAB in the last 72 hours. Anemia Panel: No results for input(s): VITAMINB12, FOLATE, FERRITIN, TIBC, IRON, RETICCTPCT in the last 72 hours. Sepsis Labs: No results for input(s): PROCALCITON, LATICACIDVEN in the last 168 hours.  Recent Results (from the past 240 hour(s))  Urine culture     Status: None   Collection Time: 06/04/19 12:08 PM   Specimen: Urine, Catheterized  Result Value Ref Range Status   Specimen Description   Final    URINE, CATHETERIZED Performed at [facility], [address], [place], [place] [address]    Special Requests   Final    NONE Performed at [facility], [address] [place], [place] [address]    Culture   Final     NO GROWTH Performed at [facility], [place] [address], [place], [place] [address]    Report Status 06/06/2019 FINAL  Final  SARS CORONAVIRUS 2 (TAT 6-24 HRS) Nasopharyngeal Nasopharyngeal Swab     Status: None   Collection Time: 06/11/19  6:23 PM   Specimen: Nasopharyngeal Swab  Result Value Ref Range Status   SARS Coronavirus 2 NEGATIVE NEGATIVE Final    Comment: (NOTE) SARS-CoV-2 target nucleic acids are NOT DETECTED. The SARS-CoV-2 RNA is generally detectable in upper and lower respiratory specimens during the acute phase of infection. Negative results do not preclude SARS-CoV-2 infection, do not rule out co-infections with other pathogens, and should not be used as the sole basis for treatment or other patient management decisions. Negative results must be combined with clinical observations, patient history, and epidemiological information. The expected result is Negative. Fact Sheet for Patients: SugarRoll.be Fact Sheet for Healthcare Providers: https://www.woods-mathews.com/ This test is not yet approved or cleared by the [place] FDA and  has been authorized for detection and/or diagnosis of SARS-CoV-2 by FDA under an Emergency Use Authorization (EUA). This EUA will remain  in effect (meaning this test can be used) for the duration of the COVID-19 declaration under Section 56 4(b)(1) of the Act, 21 U.S.C. section 360bbb-3(b)(1), unless the authorization is terminated or revoked sooner. Performed at [facility], [place] [address], [place], [place] [address]       Radiology Studies: DG Knee 1-2 Views Right  Result Date: 06/12/2019 CLINICAL DATA:  Right knee trauma. EXAM: RIGHT KNEE - 1-2 VIEW COMPARISON:  None. FINDINGS: There is extensive prepatellar soft tissue swelling. There is a moderate-sized joint effusion. There are end-stage degenerative changes of the patellofemoral compartment. There are advanced  degenerative changes of the medial and lateral compartments. Chondrocalcinosis is noted. There is no definite acute displaced fracture or dislocation. IMPRESSION: 1. Extensive prepatellar soft tissue swelling and moderate-sized joint effusion. 2. No definite acute displaced fracture or dislocation. If there is high clinical suspicion for an occult fracture, follow-up with CT is recommended. 3. End-stage degenerative changes of the patellofemoral compartment. 4. Chondrocalcinosis. Electronically Signed   By: Constance Holster M.D.   On: 06/12/2019 19:09     Scheduled Meds: . sodium chloride   Intravenous Once  . sodium chloride   Intravenous Once  . Chlorhexidine Gluconate Cloth  6 each Topical Daily  . finasteride  5 mg Oral Daily  . insulin aspart  0-9 Units Subcutaneous TID WC  . insulin glargine  45 Units Subcutaneous BID  . lamoTRIgine  200 mg Oral BID  . levETIRAcetam  1,000 mg Oral BID  . simvastatin  20 mg Oral QHS  . tamsulosin  0.4 mg Oral Daily   Continuous Infusions:    LOS: 3 days     Phillips Climes, MD [facility] If 7PM-7AM, please contact night-coverage 06/14/2019, 10:12 AM

## 2019-06-14 NOTE — Progress Notes (Signed)
Irrigated Catheter per order

## 2019-06-14 NOTE — Procedures (Signed)
Interventional Radiology Procedure Note  Procedure: Right IJ non-tunneled triple lumen HD catheter  Complications: None  Estimated Blood Loss: None  Recommendations: - Tip in upper RA, ready for use - Routine line care  Signed,  Criselda Peaches, MD

## 2019-06-14 NOTE — Patient Instructions (Signed)
RN Care Manager will follow up with patient after hospital discharge.   Chong Sicilian, BSN, RN-BC Embedded Chronic Care Manager [facility] / [facility] Management Direct Dial: [phone]

## 2019-06-14 NOTE — Progress Notes (Signed)
[facility] NEPHROLOGY PROGRESS NOTE  Assessment/ Plan: Pt is a 53 y.o. yo male with morbid obesity, OSA, pulmonary embolus, seizure, DM, HTN, RA, bipolar, CKD 4/5 follows at [place], multiple recent hospitalization for CIVID-PNA, urosepsis, AKI admitted with decreased urine output, gross hematuria and worsening renal failure.  Transferred from [facility] to [facility] for HD.  #AKI on CKD stage IV/V: Multiple recent hospitalization and now with creatinine level of 13.8.  He is oliguric and uremic.  Plan for [facility] placed by IR then start dialysis.  Plan for serial HD.  #Gross hematuria CT scan with hemorrhagic right kidney cyst.  He has Foley catheter.  May need to change.  May need urology consult if no improvement.  #Anemia due to CKD and blood loss: Iron saturation 16%.  I will order IV iron and start Aranesp during HD.  Plan to transfuse 1 unit PRBC.  #CKD-MBD: Phosphorus level elevated.  Check PTH.  Start phosphorus binders.  #Acute encephalopathy, due to medication and uremia: Plan for HD.  Recommend to hold sedatives.  #Morbid obesity  Subjective: Seen and examined at bedside.  Patient is quite lethargic however opens eyes with the name.  Review of system limited.  Plan for IR procedure today. Objective Vital signs in last 24 hours: Vitals:   06/13/19 2031 06/14/19 0249 06/14/19 0430 06/14/19 1001  BP: 119/70 (!) 133/55 (!) 117/45 (!) 118/51  Pulse: 83 72 71 65  Resp: 19 (!) 26 20 18   Temp: 98.1 F (36.7 C) 99.3 F (37.4 C) 98.2 F (36.8 C) (!) 97.5 F (36.4 C)  TempSrc: Oral Oral Axillary   SpO2: 95% 98% 95% 94%  Weight:      Height:       Weight change:   Intake/Output Summary (Last 24 hours) at 06/14/2019 1035 Last data filed at 06/14/2019 0432 Gross per 24 hour  Intake 340 ml  Output 200 ml  Net 140 ml       Labs: Basic Metabolic Panel: Recent Labs  Lab 06/08/19 0541 06/08/19 1356 06/12/19 0508 06/13/19 0824 06/14/19 0534  NA 137 138 139 137 136  K 3.8  3.6 4.3 4.4 4.2  CL 95* 96* 99 100 98  CO2 29 29 27 25  21*  GLUCOSE 194* 159* 89 145* 138*  BUN 57* 54* 69* 74* 76*  CREATININE 5.85* 5.45* 9.98* 11.54* 13.10*  CALCIUM 8.2* 8.3* 8.4* 7.9* 7.8*  PHOS 6.3* 5.9*  --  9.1*  --    Liver Function Tests: Recent Labs  Lab 06/08/19 0541 06/08/19 1356 06/11/19 1615  AST  --   --  19  ALT  --   --  10  ALKPHOS  --   --  59  BILITOT  --   --  0.8  PROT  --   --  6.9  ALBUMIN 2.3* 2.3* 2.4*   Recent Labs  Lab 06/11/19 1615  LIPASE 25   Recent Labs  Lab 06/11/19 1615  AMMONIA 23   CBC: Recent Labs  Lab 06/08/19 1356 06/11/19 1615 06/12/19 0508 06/13/19 0824 06/13/19 1718 06/14/19 0534  WBC 3.0* 4.5 4.3 3.8*  --  3.9*  NEUTROABS  --  2.8  --   --   --   --   HGB 7.1* 6.9* 7.4* 6.5* 6.7* 6.8*  HCT 23.0* 22.5* 23.9* 21.4* 21.3* 20.7*  MCV 97.5 98.7 98.8 99.1  --  95.0  PLT 70* 90* 83* 69*  --  71*   Cardiac Enzymes: No results for  input(s): CKTOTAL, CKMB, CKMBINDEX, TROPONINI in the last 168 hours. CBG: Recent Labs  Lab 06/13/19 1107 06/13/19 1613 06/13/19 2028 06/14/19 0247 06/14/19 0724  GLUCAP 123* 160* 140* 138* 111*    Iron Studies: No results for input(s): IRON, TIBC, TRANSFERRIN, FERRITIN in the last 72 hours. Studies/Results: DG Knee 1-2 Views Right  Result Date: 06/12/2019 CLINICAL DATA:  Right knee trauma. EXAM: RIGHT KNEE - 1-2 VIEW COMPARISON:  None. FINDINGS: There is extensive prepatellar soft tissue swelling. There is a moderate-sized joint effusion. There are end-stage degenerative changes of the patellofemoral compartment. There are advanced degenerative changes of the medial and lateral compartments. Chondrocalcinosis is noted. There is no definite acute displaced fracture or dislocation. IMPRESSION: 1. Extensive prepatellar soft tissue swelling and moderate-sized joint effusion. 2. No definite acute displaced fracture or dislocation. If there is high clinical suspicion for an occult fracture,  follow-up with CT is recommended. 3. End-stage degenerative changes of the patellofemoral compartment. 4. Chondrocalcinosis. Electronically Signed   By: Constance Holster M.D.   On: 06/12/2019 19:09    Medications: Infusions:   Scheduled Medications: . sodium chloride   Intravenous Once  . sodium chloride   Intravenous Once  . Chlorhexidine Gluconate Cloth  6 each Topical Daily  . finasteride  5 mg Oral Daily  . insulin aspart  0-9 Units Subcutaneous TID WC  . insulin glargine  45 Units Subcutaneous BID  . lamoTRIgine  200 mg Oral BID  . levETIRAcetam  1,000 mg Oral BID  . simvastatin  20 mg Oral QHS  . tamsulosin  0.4 mg Oral Daily    have reviewed scheduled and prn medications.  Physical Exam: General: Morbidly obese male lying in bed, not in distress Heart:RRR, s1s2 nl, no rub Lungs: Distant breath sound, no crackle appreciated Abdomen:soft, Non-tender, non-distended Extremities: Bilateral lower extremity edema Neurology: Opens eyes with the name.  Tawney Vanorman Prasad Oveta Idris 06/14/2019,10:35 AM  LOS: 3 days  Pager: [phone]

## 2019-06-14 NOTE — Care Management Important Message (Signed)
Important Message  Patient Details  Name: [name] MRN: [id number] Date of Birth: 06-Sep-1965   Medicare Important Message Given:  Yes     [name] 06/14/2019, 1:01 PM

## 2019-06-15 ENCOUNTER — Inpatient Hospital Stay (HOSPITAL_COMMUNITY): Payer: Medicare Other

## 2019-06-15 DIAGNOSIS — N186 End stage renal disease: Secondary | ICD-10-CM

## 2019-06-15 DIAGNOSIS — R310 Gross hematuria: Secondary | ICD-10-CM

## 2019-06-15 LAB — RENAL FUNCTION PANEL
Albumin: 2 g/dL — ABNORMAL LOW (ref 3.5–5.0)
Anion gap: 16 — ABNORMAL HIGH (ref 5–15)
BUN: 87 mg/dL — ABNORMAL HIGH (ref 6–20)
CO2: 21 mmol/L — ABNORMAL LOW (ref 22–32)
Calcium: 8 mg/dL — ABNORMAL LOW (ref 8.9–10.3)
Chloride: 99 mmol/L (ref 98–111)
Creatinine, Ser: 14.31 mg/dL — ABNORMAL HIGH (ref 0.61–1.24)
GFR calc Af Amer: 4 mL/min — ABNORMAL LOW (ref >60)
GFR calc non Af Amer: 3 mL/min — ABNORMAL LOW (ref >60)
Glucose, Bld: 117 mg/dL — ABNORMAL HIGH (ref 70–99)
Phosphorus: 10.7 mg/dL — ABNORMAL HIGH (ref 2.5–4.6)
Potassium: 4.9 mmol/L (ref 3.5–5.1)
Sodium: 136 mmol/L (ref 135–145)

## 2019-06-15 LAB — CBC
HCT: 20.2 % — ABNORMAL LOW (ref 39.0–52.0)
Hemoglobin: 6.7 g/dL — CL (ref 13.0–17.0)
MCH: 31.3 pg (ref 26.0–34.0)
MCHC: 33.2 g/dL (ref 30.0–36.0)
MCV: 94.4 fL (ref 80.0–100.0)
Platelets: 69 10*3/uL — ABNORMAL LOW (ref 150–400)
RBC: 2.14 MIL/uL — ABNORMAL LOW (ref 4.22–5.81)
RDW: 15 % (ref 11.5–15.5)
WBC: 3.7 10*3/uL — ABNORMAL LOW (ref 4.0–10.5)
nRBC: 0 % (ref 0.0–0.2)

## 2019-06-15 LAB — HEPATITIS B SURFACE ANTIBODY,QUALITATIVE: Hep B S Ab: REACTIVE — AB

## 2019-06-15 LAB — GLUCOSE, CAPILLARY
Glucose-Capillary: 93 mg/dL (ref 70–99)
Glucose-Capillary: 109 mg/dL — ABNORMAL HIGH (ref 70–99)
Glucose-Capillary: 143 mg/dL — ABNORMAL HIGH (ref 70–99)

## 2019-06-15 LAB — PREPARE RBC (CROSSMATCH)

## 2019-06-15 LAB — PROTIME-INR
INR: 1.8 — ABNORMAL HIGH (ref 0.8–1.2)
Prothrombin Time: 20.5 seconds — ABNORMAL HIGH (ref 11.4–15.2)

## 2019-06-15 LAB — HEPATITIS B SURFACE ANTIGEN: Hepatitis B Surface Ag: NONREACTIVE

## 2019-06-15 LAB — HEPATITIS B CORE ANTIBODY, TOTAL: Hep B Core Total Ab: NONREACTIVE

## 2019-06-15 LAB — HEMOGLOBIN AND HEMATOCRIT, BLOOD
HCT: 24.3 % — ABNORMAL LOW (ref 39.0–52.0)
Hemoglobin: 7.8 g/dL — ABNORMAL LOW (ref 13.0–17.0)

## 2019-06-15 LAB — MRSA PCR SCREENING: MRSA by PCR: NEGATIVE

## 2019-06-15 MED ORDER — SODIUM CHLORIDE 0.9 % IV SOLN: 100.0000 mL | INTRAVENOUS | Status: DC | PRN

## 2019-06-15 MED ORDER — SODIUM CHLORIDE 0.9% IV SOLUTION: Freq: Once | INTRAVENOUS | Status: AC

## 2019-06-15 MED ORDER — ALTEPLASE 2 MG IJ SOLR: 2.0000 mg | Freq: Once | INTRAMUSCULAR | Status: DC | PRN

## 2019-06-15 MED ORDER — PENTAFLUOROPROP-TETRAFLUOROETH EX AERO: 1.0000 "application " | INHALATION_SPRAY | CUTANEOUS | Status: DC | PRN

## 2019-06-15 MED ORDER — LIDOCAINE HCL (PF) 1 % IJ SOLN: 5.0000 mL | INTRAMUSCULAR | Status: DC | PRN

## 2019-06-15 MED ORDER — SODIUM CHLORIDE 0.9 % IV SOLN
INTRAVENOUS | Status: DC | PRN
  Administered 2019-06-15: 250 mL via INTRAVENOUS

## 2019-06-15 MED ORDER — LIDOCAINE-PRILOCAINE 2.5-2.5 % EX CREA: 1.0000 "application " | TOPICAL_CREAM | CUTANEOUS | Status: DC | PRN

## 2019-06-15 MED ORDER — CHLORHEXIDINE GLUCONATE CLOTH 2 % EX PADS: 6.0000 | MEDICATED_PAD | Freq: Every day | CUTANEOUS | Status: DC

## 2019-06-15 MED ORDER — SODIUM CHLORIDE 0.9 % IR SOLN: 3000.0000 mL | Status: DC

## 2019-06-15 MED ORDER — HEPARIN SODIUM (PORCINE) 1000 UNIT/ML IJ SOLN
INTRAMUSCULAR | Status: AC
  Filled 2019-06-15: qty 3

## 2019-06-15 MED ORDER — HEPARIN SODIUM (PORCINE) 1000 UNIT/ML DIALYSIS: 1000.0000 [IU] | INTRAMUSCULAR | Status: DC | PRN

## 2019-06-15 NOTE — Plan of Care (Signed)
  Problem: Education: Goal: Knowledge of General Education information will improve Description Including pain rating scale, medication(s)/side effects and non-pharmacologic comfort measures Outcome: Progressing   
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
## 2019-06-15 NOTE — Progress Notes (Signed)
Bilateral upper extremity vein mapping has been completed. Preliminary results can be found in CV Proc through chart review.   06/15/19 12:45 PM Carlos Levering RVT

## 2019-06-15 NOTE — Progress Notes (Signed)
[facility] NEPHROLOGY PROGRESS NOTE  Assessment/ Plan: Pt is a 53 y.o. yo male with morbid obesity, OSA, pulmonary embolus, seizure, DM, HTN, RA, bipolar, CKD 4/5 follows at [facility], multiple recent hospitalization for CIVID-PNA, urosepsis, AKI admitted with decreased urine output, gross hematuria and worsening renal failure.  Transferred from [facility] to [facility] for HD.  #AKI on CKD stage IV/V: Multiple recent hospitalization and now with creatinine level of 13.8.  He is oliguric and uremic.  Status post right IJ TDC placed by IR on 12/16. First HD today and plan for serial dialysis. He will need outpatient HD set up on discharge Order vein mapping.  #Gross hematuria CT scan with hemorrhagic right kidney cyst.  He has Foley catheter.  May need to change.  May need urology consult if [name] improvement.  #Anemia due to CKD and blood loss: Iron saturation 16%.  Continue IV iron and Aranesp during HD.  Received PRBC yesterday and plan for another 2 units today.  #CKD-MBD: Phosphorus level elevated.  Check PTH.  Start phosphorus binders.  #Acute encephalopathy, due to medication and uremia: Plan for HD.  Recommend to hold sedatives.  #Morbid obesity  Subjective: Seen and examined at HD unit.  Tolerating dialysis well.  More alert today.  Has right IJ TDC.  Denies nausea vomiting.  [name] acute event. Objective Vital signs in last 24 hours: Vitals:   06/15/19 0915 06/15/19 0930 06/15/19 0945 06/15/19 0950  BP: (!) 107/58 123/76 122/61 (!) 130/55  Pulse: 75 73 74 73  Resp: 20 16 14 14   Temp: 98.1 F (36.7 C) 98.8 F (37.1 C) 98.8 F (37.1 C) 98.8 F (37.1 C)  TempSrc: Oral Oral Oral Oral  SpO2:      Weight:      Height:       Weight change:   Intake/Output Summary (Last 24 hours) at 06/15/2019 1016 Last data filed at 06/15/2019 0950 Gross per 24 hour  Intake 1055 ml  Output 3075 ml  Net -2020 ml       Labs: Basic Metabolic Panel: Recent Labs  Lab 06/08/19 1356  06/13/19 0824 06/14/19 0534 06/15/19 0745  NA 138 137 136 136  K 3.6 4.4 4.2 4.9  CL 96* 100 98 99  CO2 29 25 21* 21*  GLUCOSE 159* 145* 138* 117*  BUN 54* 74* 76* 87*  CREATININE 5.45* 11.54* 13.10* 14.31*  CALCIUM 8.3* 7.9* 7.8* 8.0*  PHOS 5.9* 9.1*  --  10.7*   Liver Function Tests: Recent Labs  Lab 06/08/19 1356 06/11/19 1615 06/15/19 0745  AST  --  19  --   ALT  --  10  --   ALKPHOS  --  59  --   BILITOT  --  0.8  --   PROT  --  6.9  --   ALBUMIN 2.3* 2.4* 2.0*   Recent Labs  Lab 06/11/19 1615  LIPASE 25   Recent Labs  Lab 06/11/19 1615  AMMONIA 23   CBC: Recent Labs  Lab 06/11/19 1615 06/12/19 0508 06/13/19 0824 06/13/19 1718 06/14/19 0534 06/15/19 0746  WBC 4.5 4.3 3.8*  --  3.9* 3.7*  NEUTROABS 2.8  --   --   --   --   --   HGB 6.9* 7.4* 6.5* 6.7* 6.8* 6.7*  HCT 22.5* 23.9* 21.4* 21.3* 20.7* 20.2*  MCV 98.7 98.8 99.1  --  95.0 94.4  PLT 90* 83* 69*  --  71* 69*   Cardiac Enzymes: [name] results  for input(s): CKTOTAL, CKMB, CKMBINDEX, TROPONINI in the last 168 hours. CBG: Recent Labs  Lab 06/14/19 0247 06/14/19 0724 06/14/19 1123 06/14/19 1642 06/14/19 2126  GLUCAP 138* 111* 114* 98 108*    Iron Studies: [name] results for input(s): IRON, TIBC, TRANSFERRIN, FERRITIN in the last 72 hours. Studies/Results: IR Fluoro Guide CV Line Right  Result Date: 06/14/2019 INDICATION: 53 year old male with acute renal failure in need of hemodialysis. He is currently anticoagulated and thrombocytopenic. Therefore, a temporary non tunneled hemodialysis catheter will be placed. EXAM: IR RIGHT FLUORO GUIDE CV LINE; IR ULTRASOUND GUIDANCE VASC ACCESS RIGHT MEDICATIONS: None ANESTHESIA/SEDATION: None FLUOROSCOPY TIME:  Fluoroscopy Time: 0 minutes 18 seconds (5.3 mGy). COMPLICATIONS: None immediate. PROCEDURE: Informed written consent was obtained from the patient after a thorough discussion of the procedural risks, benefits and alternatives. All questions were  addressed. Maximal Sterile Barrier Technique was utilized including caps, mask, sterile gowns, sterile gloves, sterile drape, hand hygiene and skin antiseptic. A timeout was performed prior to the initiation of the procedure. The right internal jugular vein was interrogated with ultrasound and found to be widely patent. An image was obtained and stored for the medical record. Local anesthesia was attained by infiltration with 1% lidocaine. A small dermatotomy was made. Under real-time sonographic guidance, the vessel was punctured with a 21 gauge micropuncture needle. Using standard technique, the initial micro needle was exchanged over a 0.018 micro wire for a transitional 4 [place] micro sheath. The micro sheath was then exchanged over a 0.035 wire for a fascial dilator and the soft tissue tract was dilated. A 20 cm triple-lumen non tunneled hemodialysis catheter was then advanced over the wire and position with the tip in the upper right atrium. The catheter flushes and aspirates easily. The catheter was flushed with heparinized saline and secured to the skin with 0 Prolene suture. A sterile bandage was applied. The catheter hubs were capped. IMPRESSION: Successful placement of a right IJ approach 20 cm triple-lumen non tunneled hemodialysis catheter. The catheter tip is in the upper right atrium and ready for immediate use. Electronically Signed   By: Jacqulynn Cadet M.D.   On: 06/14/2019 18:59   IR US Guide Vasc Access Right  Result Date: 06/14/2019 INDICATION: 53 year old male with acute renal failure in need of hemodialysis. He is currently anticoagulated and thrombocytopenic. Therefore, a temporary non tunneled hemodialysis catheter will be placed. EXAM: IR RIGHT FLUORO GUIDE CV LINE; IR ULTRASOUND GUIDANCE VASC ACCESS RIGHT MEDICATIONS: None ANESTHESIA/SEDATION: None FLUOROSCOPY TIME:  Fluoroscopy Time: 0 minutes 18 seconds (5.3 mGy). COMPLICATIONS: None immediate. PROCEDURE: Informed written consent  was obtained from the patient after a thorough discussion of the procedural risks, benefits and alternatives. All questions were addressed. Maximal Sterile Barrier Technique was utilized including caps, mask, sterile gowns, sterile gloves, sterile drape, hand hygiene and skin antiseptic. A timeout was performed prior to the initiation of the procedure. The right internal jugular vein was interrogated with ultrasound and found to be widely patent. An image was obtained and stored for the medical record. Local anesthesia was attained by infiltration with 1% lidocaine. A small dermatotomy was made. Under real-time sonographic guidance, the vessel was punctured with a 21 gauge micropuncture needle. Using standard technique, the initial micro needle was exchanged over a 0.018 micro wire for a transitional 4 [place] micro sheath. The micro sheath was then exchanged over a 0.035 wire for a fascial dilator and the soft tissue tract was dilated. A 20 cm triple-lumen non tunneled hemodialysis catheter was  then advanced over the wire and position with the tip in the upper right atrium. The catheter flushes and aspirates easily. The catheter was flushed with heparinized saline and secured to the skin with 0 Prolene suture. A sterile bandage was applied. The catheter hubs were capped. IMPRESSION: Successful placement of a right IJ approach 20 cm triple-lumen non tunneled hemodialysis catheter. The catheter tip is in the upper right atrium and ready for immediate use. Electronically Signed   By: Jacqulynn Cadet M.D.   On: 06/14/2019 18:59    Medications: Infusions: . sodium chloride    . sodium chloride    . ferric gluconate (FERRLECIT/NULECIT) IV 125 mg (06/15/19 0810)    Scheduled Medications: . sodium chloride   Intravenous Once  . sodium chloride   Intravenous Once  . sodium chloride   Intravenous Once  . calcium acetate  667 mg Oral TID WC  . Chlorhexidine Gluconate Cloth  6 each Topical Daily  .  Chlorhexidine Gluconate Cloth  6 each Topical Q0600  . [START ON 06/20/2019] darbepoetin (ARANESP) injection - DIALYSIS  60 mcg Intravenous Q Tue-HD  . fentaNYL  1 patch Transdermal Q3 days  . finasteride  5 mg Oral Daily  . heparin      . insulin aspart  0-9 Units Subcutaneous TID WC  . insulin glargine  45 Units Subcutaneous BID  . lamoTRIgine  200 mg Oral BID  . levETIRAcetam  1,000 mg Oral BID  . simvastatin  20 mg Oral QHS  . tamsulosin  0.4 mg Oral Daily    have reviewed scheduled and prn medications.  Physical Exam: General: Morbidly obese male lying in bed, not in distress Heart:RRR, s1s2 nl, [name] rub Lungs: Distant breath sound, [name] crackle appreciated Abdomen:soft, Non-tender, non-distended Extremities: Bilateral lower extremity edema Neurology: Opens eyes with the name. Dialysis Access: Right IJ TDC placed by IR on 12/16.  Adhrit Krenz Prasad Neidy Guerrieri 06/15/2019,10:16 AM  LOS: 4 days  Pager: ID:[phone]

## 2019-06-15 NOTE — Progress Notes (Signed)
PROGRESS NOTE    [name]  [id number] DOB: 27-Apr-1966 DOA: 06/11/2019 PCP: Sharion Balloon, FNP   Interval History:   53 y.o. male has multiple medical problems including morbid obesity, OSA/OHS, h/o pulmonary embolus, seizure disorder, DM, HTN, RA, migraines, thrombocytopenia, Bipolar disorder, CKD stage 4 (baseline Cr 3.2-4.1 followed by Dr. Moshe Cipro), and multiple hospitalizations over te past 2 months including 10/11-10/27 due to covid pneumonia with acute on chronic respiratory failure, 11/11-11/16/20 due to AKI and urosepsis, then most recently 12/6-12/10/20 with dysuria and pyuria accompanied with AKI/CKD.  Since discharge, he has not been feeling well and noted significant decrease in UOP .  He did have gross hematuria prior to admission but had cleared up , patient presents with worsening creatinine, anuria, renal has been consulted, recommendation to initiate hemodialysis.  Significant events: 12/13 admitted to [facility] 12/15 transferred to [facility]. 12/15 1 unit PRBC 12/16 1 unit PRBC transfusion 12/16 TDC insertion by IR 12/17 initiation hemodialysis 12/17 2 units PRBC transfusions   Subjective:  Patient denies any chest pain, shortness of breath, fever or chills, reports generalized weakness and fatigue.   Assessment & Plan:   Active Problems:   Acute-on-chronic kidney injury ([place])  Acute on chronic kidney disease stage 5, anuric -creatinine 9.42 on presentation, recent discharge creatinine 5.4- 5.8.   -He does appear to be anuric, with significant uremia on presentation. -TDC inserted by IR 12/16 -Renal input greatly appreciated, noted hemodialysis 12/17 -Avoid nephrotoxic medications  Gross hematuria/hemorrhagic cyst -It remains significant problem, he was seen by urology during previous admission, patient still with significant hematuria, requiring PRBC transfusions . -Continue to hold warfarin . -Urology was reconsulted for  further recommendations . -Continue to flush Foley catheter with 100 cc every 4 hours.  Acute on chronic anemia -Anemia of chronic kidney disease, acute blood loss anemia in the setting of gross hematuria. -So far transfused 2 units PRBC during hospital stay, hemoglobin low at 6.7 today, will transfuse 2 units PRBC, first unit already received during hemodialysis. -Likely will need Procrit, will defer to renal.  Thrombocytopenia  -Chronic recurrent problem, in the setting of splenomegaly, so far no indication for transfusion given count today 69K.  Metabolic and toxic encephalopathy likely 2/2 uremia and sedative, improved -Lethargic but arousable initially, this has significantly improved, currently awake alert and appropriate - He was on multiple medications including narcotics and lyrica.    He is on fentanyl patch as well. -Hold home buspirone, Cymbalta, narcotics, Lyrica -continue home Keppra, lamotrigine -Mentation should improve after starting hemodialysis given he has uremia.  Recent urinary tract infection, POA -continue 7-day of Augmentin prescribed at last discharge on 06/08/19, last day 12/16  Prolonged QTC-505.   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  Seizure history-  -continue home Keppra,Lamictal  diabetes mellitus on insulin 04/10/19 [id number]. -continue Lantus at reduced dose45 u BID,  -Hold home premeal insulin, monthly emgality,weekly Trulicity,Jardiance. - SSI  # Chronic respiratory failure on home 3L O2 # OSA and OHS --continue suppl O2 --continuous pulse ox -Does not use CPAP at home  Morbid obesity BMI54  Depression bipolar disorder -Hold home buspirone, Cymbalta, Lyrica  Chronic pain syndrome -Hold home oral PRN opioids due to increased lethargy and now poor kidney clearance --Pt currently has a  Fentanyl patch 50 on, due for exchange on 12/16.  It was resumed   DVT prophylaxis: on  warfarin  Code Status: Full code, patient reports he would leave decision up to his wife to make, does not wish to be kept alive on life support if he is vegetable.  Family Communication: none at bedside Consults called: nephrology , IR, urology Disposition Plan: remains inpatient     Objective: Vitals:   06/15/19 0915 06/15/19 0930 06/15/19 0945 06/15/19 0950  BP: (!) 107/58 123/76 122/61 (!) 130/55  Pulse: 75 73 74 73  Resp: 20 16 14 14   Temp: 98.1 F (36.7 C) 98.8 F (37.1 C) 98.8 F (37.1 C) 98.8 F (37.1 C)  TempSrc: Oral Oral Oral Oral  SpO2: 98% 98% 99% 96%  Weight:      Height:        Intake/Output Summary (Last 24 hours) at 06/15/2019 1139 Last data filed at 06/15/2019 0950 Gross per 24 hour  Intake 1055 ml  Output 3075 ml  Net -2020 ml   Filed Weights   06/11/19 1405 06/11/19 1412 06/14/19 2127  Weight: (!) 190.5 kg (!) 190.5 kg (!) 190.4 kg    Examination:   Awake Alert, Oriented X 3, No new F.N deficits, Normal affect Symmetrical Chest wall movement, diminished air entry at the bases, no wheezing RRR,No Gallops,Rubs or new Murmurs, No Parasternal Heave +ve B.Sounds, Abd Soft, No tenderness, No rebound - guarding or rigidity. No Cyanosis, Clubbing +2 edema, patient with bruising, mainly in the right shoulder, and right knee secondary to recent fall.    Data Reviewed: I have personally reviewed following labs and imaging studies  CBC: Recent Labs  Lab 06/11/19 1615 06/12/19 0508 06/13/19 0824 06/13/19 1718 06/14/19 0534 06/15/19 0746  WBC 4.5 4.3 3.8*  --  3.9* 3.7*  NEUTROABS 2.8  --   --   --   --   --   HGB 6.9* 7.4* 6.5* 6.7* 6.8* 6.7*  HCT 22.5* 23.9* 21.4* 21.3* 20.7* 20.2*  MCV 98.7 98.8 99.1  --  95.0 94.4  PLT 90* 83* 69*  --  71* 69*   Basic Metabolic Panel: Recent Labs  Lab 06/08/19 1356 06/11/19 1615 06/12/19 0508 06/13/19 0824  06/14/19 0534 06/15/19 0745  NA 138 136 139 137 136 136  K 3.6 4.1 4.3 4.4 4.2 4.9  CL 96* 95* 99 100 98 99  CO2 29 25 27 25  21* 21*  GLUCOSE 159* 128* 89 145* 138* 117*  BUN 54* 65* 69* 74* 76* 87*  CREATININE 5.45* 9.42* 9.98* 11.54* 13.10* 14.31*  CALCIUM 8.3* 8.3* 8.4* 7.9* 7.8* 8.0*  MG  --  2.5*  --  2.5* 2.5*  --   PHOS 5.9*  --   --  9.1*  --  10.7*   GFR: Estimated Creatinine Clearance: 10.8 mL/min (A) (by C-G formula based on SCr of 14.31 mg/dL (H)). Liver Function Tests: Recent Labs  Lab 06/08/19 1356 06/11/19 1615 06/15/19 0745  AST  --  19  --   ALT  --  10  --   ALKPHOS  --  59  --   BILITOT  --  0.8  --   PROT  --  6.9  --   ALBUMIN 2.3* 2.4* 2.0*   Recent Labs  Lab 06/11/19 1615  LIPASE 25   Recent Labs  Lab 06/11/19 1615  AMMONIA 23   Coagulation Profile: Recent Labs  Lab 06/11/19 1615 06/13/19 1158 06/15/19 0825  INR 2.8* 2.6* 1.8*   Cardiac Enzymes: No results for input(s): CKTOTAL, CKMB, CKMBINDEX, TROPONINI in the last 168  hours. BNP (last 3 results) No results for input(s): PROBNP in the last 8760 hours. HbA1C: No results for input(s): HGBA1C in the last 72 hours. CBG: Recent Labs  Lab 06/14/19 0247 06/14/19 0724 06/14/19 1123 06/14/19 1642 06/14/19 2126  GLUCAP 138* 111* 114* 98 108*   Lipid Profile: No results for input(s): CHOL, HDL, LDLCALC, TRIG, CHOLHDL, LDLDIRECT in the last 72 hours. Thyroid Function Tests: No results for input(s): TSH, T4TOTAL, FREET4, T3FREE, THYROIDAB in the last 72 hours. Anemia Panel: No results for input(s): VITAMINB12, FOLATE, FERRITIN, TIBC, IRON, RETICCTPCT in the last 72 hours. Sepsis Labs: No results for input(s): PROCALCITON, LATICACIDVEN in the last 168 hours.  Recent Results (from the past 240 hour(s))  SARS CORONAVIRUS 2 (TAT 6-24 HRS) Nasopharyngeal Nasopharyngeal Swab     Status: None   Collection Time: 06/11/19  6:23 PM   Specimen: Nasopharyngeal Swab  Result Value Ref Range  Status   SARS Coronavirus 2 NEGATIVE NEGATIVE Final    Comment: (NOTE) SARS-CoV-2 target nucleic acids are NOT DETECTED. The SARS-CoV-2 RNA is generally detectable in upper and lower respiratory specimens during the acute phase of infection. Negative results do not preclude SARS-CoV-2 infection, do not rule out co-infections with other pathogens, and should not be used as the sole basis for treatment or other patient management decisions. Negative results must be combined with clinical observations, patient history, and epidemiological information. The expected result is Negative. Fact Sheet for Patients: SugarRoll.be Fact Sheet for Healthcare Providers: https://www.woods-mathews.com/ This test is not yet approved or cleared by the [place] FDA and  has been authorized for detection and/or diagnosis of SARS-CoV-2 by FDA under an Emergency Use Authorization (EUA). This EUA will remain  in effect (meaning this test can be used) for the duration of the COVID-19 declaration under Section 56 4(b)(1) of the Act, 21 U.S.C. section 360bbb-3(b)(1), unless the authorization is terminated or revoked sooner. Performed at [facility], [place] [address], [place], [place] [address]   MRSA PCR Screening     Status: None   Collection Time: 06/15/19  4:40 AM   Specimen: Nasal Mucosa; Nasopharyngeal  Result Value Ref Range Status   MRSA by PCR NEGATIVE NEGATIVE Final    Comment:        The GeneXpert MRSA Assay (FDA approved for NASAL specimens only), is one component of a comprehensive MRSA colonization surveillance program. It is not intended to diagnose MRSA infection nor to guide or monitor treatment for MRSA infections. Performed at [facility], [place] [address], [place], [place] [address]       Radiology Studies: IR Fluoro Guide CV Line Right  Result Date: 06/14/2019 INDICATION: 53 year old male with acute renal failure in need  of hemodialysis. He is currently anticoagulated and thrombocytopenic. Therefore, a temporary non tunneled hemodialysis catheter will be placed. EXAM: IR RIGHT FLUORO GUIDE CV LINE; IR ULTRASOUND GUIDANCE VASC ACCESS RIGHT MEDICATIONS: None ANESTHESIA/SEDATION: None FLUOROSCOPY TIME:  Fluoroscopy Time: 0 minutes 18 seconds (5.3 mGy). COMPLICATIONS: None immediate. PROCEDURE: Informed written consent was obtained from the patient after a thorough discussion of the procedural risks, benefits and alternatives. All questions were addressed. Maximal Sterile Barrier Technique was utilized including caps, mask, sterile gowns, sterile gloves, sterile drape, hand hygiene and skin antiseptic. A timeout was performed prior to the initiation of the procedure. The right internal jugular vein was interrogated with ultrasound and found to be widely patent. An image was obtained and stored for the medical record. Local anesthesia was  attained by infiltration with 1% lidocaine. A small dermatotomy was made. Under real-time sonographic guidance, the vessel was punctured with a 21 gauge micropuncture needle. Using standard technique, the initial micro needle was exchanged over a 0.018 micro wire for a transitional 4 [place] micro sheath. The micro sheath was then exchanged over a 0.035 wire for a fascial dilator and the soft tissue tract was dilated. A 20 cm triple-lumen non tunneled hemodialysis catheter was then advanced over the wire and position with the tip in the upper right atrium. The catheter flushes and aspirates easily. The catheter was flushed with heparinized saline and secured to the skin with 0 Prolene suture. A sterile bandage was applied. The catheter hubs were capped. IMPRESSION: Successful placement of a right IJ approach 20 cm triple-lumen non tunneled hemodialysis catheter. The catheter tip is in the upper right atrium and ready for immediate use. Electronically Signed   By: Jacqulynn Cadet M.D.   On: 06/14/2019  18:59   IR US Guide Vasc Access Right  Result Date: 06/14/2019 INDICATION: 53 year old male with acute renal failure in need of hemodialysis. He is currently anticoagulated and thrombocytopenic. Therefore, a temporary non tunneled hemodialysis catheter will be placed. EXAM: IR RIGHT FLUORO GUIDE CV LINE; IR ULTRASOUND GUIDANCE VASC ACCESS RIGHT MEDICATIONS: None ANESTHESIA/SEDATION: None FLUOROSCOPY TIME:  Fluoroscopy Time: 0 minutes 18 seconds (5.3 mGy). COMPLICATIONS: None immediate. PROCEDURE: Informed written consent was obtained from the patient after a thorough discussion of the procedural risks, benefits and alternatives. All questions were addressed. Maximal Sterile Barrier Technique was utilized including caps, mask, sterile gowns, sterile gloves, sterile drape, hand hygiene and skin antiseptic. A timeout was performed prior to the initiation of the procedure. The right internal jugular vein was interrogated with ultrasound and found to be widely patent. An image was obtained and stored for the medical record. Local anesthesia was attained by infiltration with 1% lidocaine. A small dermatotomy was made. Under real-time sonographic guidance, the vessel was punctured with a 21 gauge micropuncture needle. Using standard technique, the initial micro needle was exchanged over a 0.018 micro wire for a transitional 4 [place] micro sheath. The micro sheath was then exchanged over a 0.035 wire for a fascial dilator and the soft tissue tract was dilated. A 20 cm triple-lumen non tunneled hemodialysis catheter was then advanced over the wire and position with the tip in the upper right atrium. The catheter flushes and aspirates easily. The catheter was flushed with heparinized saline and secured to the skin with 0 Prolene suture. A sterile bandage was applied. The catheter hubs were capped. IMPRESSION: Successful placement of a right IJ approach 20 cm triple-lumen non tunneled hemodialysis catheter. The catheter  tip is in the upper right atrium and ready for immediate use. Electronically Signed   By: Jacqulynn Cadet M.D.   On: 06/14/2019 18:59     Scheduled Meds: . sodium chloride   Intravenous Once  . sodium chloride   Intravenous Once  . sodium chloride   Intravenous Once  . calcium acetate  667 mg Oral TID WC  . Chlorhexidine Gluconate Cloth  6 each Topical Daily  . Chlorhexidine Gluconate Cloth  6 each Topical Q0600  . [START ON 06/20/2019] darbepoetin (ARANESP) injection - DIALYSIS  60 mcg Intravenous Q Tue-HD  . fentaNYL  1 patch Transdermal Q3 days  . finasteride  5 mg Oral Daily  . heparin      . insulin aspart  0-9 Units Subcutaneous TID WC  . insulin glargine  45 Units Subcutaneous BID  . lamoTRIgine  200 mg Oral BID  . levETIRAcetam  1,000 mg Oral BID  . simvastatin  20 mg Oral QHS  . tamsulosin  0.4 mg Oral Daily   Continuous Infusions: . sodium chloride 250 mL (06/15/19 1130)  . ferric gluconate (FERRLECIT/NULECIT) IV 125 mg (06/15/19 1132)     LOS: 4 days     Phillips Climes, MD [facility] If 7PM-7AM, please contact night-coverage 06/15/2019, 11:39 AM

## 2019-06-16 DIAGNOSIS — N186 End stage renal disease: Secondary | ICD-10-CM

## 2019-06-16 DIAGNOSIS — Z992 Dependence on renal dialysis: Secondary | ICD-10-CM

## 2019-06-16 LAB — GLUCOSE, CAPILLARY
Glucose-Capillary: 116 mg/dL — ABNORMAL HIGH (ref 70–99)
Glucose-Capillary: 115 mg/dL — ABNORMAL HIGH (ref 70–99)
Glucose-Capillary: 122 mg/dL — ABNORMAL HIGH (ref 70–99)
Glucose-Capillary: 123 mg/dL — ABNORMAL HIGH (ref 70–99)

## 2019-06-16 LAB — CBC
HCT: 23.6 % — ABNORMAL LOW (ref 39.0–52.0)
Hemoglobin: 7.5 g/dL — ABNORMAL LOW (ref 13.0–17.0)
MCH: 30.5 pg (ref 26.0–34.0)
MCHC: 31.8 g/dL (ref 30.0–36.0)
MCV: 95.9 fL (ref 80.0–100.0)
Platelets: 69 10*3/uL — ABNORMAL LOW (ref 150–400)
RBC: 2.46 MIL/uL — ABNORMAL LOW (ref 4.22–5.81)
RDW: 15 % (ref 11.5–15.5)
WBC: 3.5 10*3/uL — ABNORMAL LOW (ref 4.0–10.5)
nRBC: 0 % (ref 0.0–0.2)

## 2019-06-16 LAB — BPAM RBC
Blood Product Expiration Date: 202101152359
Blood Product Expiration Date: 202101162359
Blood Product Expiration Date: 202101162359
ISSUE DATE / TIME: 202012161058
ISSUE DATE / TIME: 202012170913
ISSUE DATE / TIME: 202012171328
Unit Type and Rh: 6200
Unit Type and Rh: 6200
Unit Type and Rh: 6200

## 2019-06-16 LAB — TYPE AND SCREEN
ABO/RH(D): A POS
Antibody Screen: NEGATIVE
Unit division: 0
Unit division: 0
Unit division: 0

## 2019-06-16 LAB — RENAL FUNCTION PANEL
Albumin: 2 g/dL — ABNORMAL LOW (ref 3.5–5.0)
Anion gap: 14 (ref 5–15)
BUN: 64 mg/dL — ABNORMAL HIGH (ref 6–20)
CO2: 23 mmol/L (ref 22–32)
Calcium: 7.8 mg/dL — ABNORMAL LOW (ref 8.9–10.3)
Chloride: 98 mmol/L (ref 98–111)
Creatinine, Ser: 11.08 mg/dL — ABNORMAL HIGH (ref 0.61–1.24)
GFR calc Af Amer: 5 mL/min — ABNORMAL LOW (ref >60)
GFR calc non Af Amer: 5 mL/min — ABNORMAL LOW (ref >60)
Glucose, Bld: 124 mg/dL — ABNORMAL HIGH (ref 70–99)
Phosphorus: 8.4 mg/dL — ABNORMAL HIGH (ref 2.5–4.6)
Potassium: 4.4 mmol/L (ref 3.5–5.1)
Sodium: 135 mmol/L (ref 135–145)

## 2019-06-16 LAB — PROTIME-INR
INR: 1.4 — ABNORMAL HIGH (ref 0.8–1.2)
Prothrombin Time: 17.3 seconds — ABNORMAL HIGH (ref 11.4–15.2)

## 2019-06-16 MED ORDER — ALTEPLASE 2 MG IJ SOLR: 2.0000 mg | Freq: Once | INTRAMUSCULAR | Status: DC | PRN

## 2019-06-16 MED ORDER — SODIUM CHLORIDE 0.9 % IV SOLN: 100.0000 mL | INTRAVENOUS | Status: DC | PRN

## 2019-06-16 MED ORDER — LIDOCAINE-PRILOCAINE 2.5-2.5 % EX CREA: 1.0000 "application " | TOPICAL_CREAM | CUTANEOUS | Status: DC | PRN

## 2019-06-16 MED ORDER — HEPARIN SODIUM (PORCINE) 1000 UNIT/ML DIALYSIS: 1000.0000 [IU] | INTRAMUSCULAR | Status: DC | PRN

## 2019-06-16 MED ORDER — HEPARIN SODIUM (PORCINE) 1000 UNIT/ML IJ SOLN
INTRAMUSCULAR | Status: AC
  Administered 2019-06-16: 2800 [IU] via INTRAVENOUS_CENTRAL
  Filled 2019-06-16: qty 4

## 2019-06-16 MED ORDER — LIDOCAINE HCL (PF) 1 % IJ SOLN: 5.0000 mL | INTRAMUSCULAR | Status: DC | PRN

## 2019-06-16 MED ORDER — CHLORHEXIDINE GLUCONATE CLOTH 2 % EX PADS
6.0000 | MEDICATED_PAD | Freq: Every day | CUTANEOUS | Status: DC
  Administered 2019-06-17: 6 via TOPICAL

## 2019-06-16 MED ORDER — CALCIUM ACETATE (PHOS BINDER) 667 MG PO CAPS
1334.0000 mg | ORAL_CAPSULE | Freq: Three times a day (TID) | ORAL | Status: DC
  Administered 2019-06-16 – 2019-07-03 (×36): 1334 mg via ORAL
  Filled 2019-06-16 (×41): qty 2

## 2019-06-16 MED ORDER — PENTAFLUOROPROP-TETRAFLUOROETH EX AERO: 1.0000 "application " | INHALATION_SPRAY | CUTANEOUS | Status: DC | PRN
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
## 2019-06-16 NOTE — Progress Notes (Signed)
PROGRESS NOTE    [name]  [id number] DOB: 04/05/1966 DOA: 06/11/2019 PCP: Sharion Balloon, FNP   Interval History:   53 y.o. male has multiple medical problems including morbid obesity, OSA/OHS, h/o pulmonary embolus, seizure disorder, DM, HTN, RA, migraines, thrombocytopenia, Bipolar disorder, CKD stage 4 (baseline Cr 3.2-4.1 followed by Dr. Moshe Cipro), and multiple hospitalizations over te past 2 months including 10/11-10/27 due to covid pneumonia with acute on chronic respiratory failure, 11/11-11/16/20 due to AKI and urosepsis, then most recently 12/6-12/10/20 with dysuria and pyuria accompanied with AKI/CKD.  Since discharge, he has not been feeling well and noted significant decrease in UOP .  He did have gross hematuria prior to admission but had cleared up , patient presents with worsening creatinine, anuria, renal has been consulted, recommendation to initiate hemodialysis.  Significant events: 12/13 admitted to [facility] 12/15 transferred to [facility]. 12/15 1 unit PRBC 12/16 1 unit PRBC transfusion 12/16 TDC insertion by IR 12/17 initiation hemodialysis 12/17 2 units PRBC transfusions    Subjective:  Patient denies any chest pain, shortness of breath, fever or chills, reports generalized weakness and fatigue.   Assessment & Plan:   Active Problems:   Acute-on-chronic kidney injury ([place])  Acute on chronic kidney disease stage 5, anuric -creatinine 9.42 on presentation, recent discharge creatinine 5.4- 5.8.   -He does appear to be anuric, with significant uremia on presentation. -TDC inserted by IR 12/16 -Renal input greatly appreciated, tarted on hemodialysis 12/17, will need serial hemodialysis initially, will need hemodialysis on discharge -We will need permanent access, VVS is consulted by renal  Gross hematuria/hemorrhagic cyst -It remains significant problem, he was seen by urology during previous admission, patient still with  significant hematuria, requiring PRBC transfusions . -Continue to hold warfarin . -Urology consult greatly appreciated. -Continue to flush Foley catheter with 100 cc every 4 hours.  Acute on chronic anemia -Anemia of chronic kidney disease, and acute blood loss anemia in the setting of gross hematuria. -He required 4 units PRBC transfusion so far during hospital stay. -On Procrit per renal.  Thrombocytopenia  -Chronic recurrent problem, in the setting of splenomegaly, so far no indication for transfusion given count today 69K.  Metabolic and toxic encephalopathy likely 2/2 uremia and sedative, improved -Lethargic but arousable initially, this has significantly improved, currently awake alert and appropriate - He was on multiple medications including narcotics and lyrica.    He is on fentanyl patch as well. -Hold home buspirone, Cymbalta, narcotics, Lyrica -continue home Keppra, lamotrigine -Mentation should improve after starting hemodialysis given he has uremia.  Recent urinary tract infection, POA -Finished 7-day of Augmentin prescribed at last discharge on 06/08/19, last day 12/16  Prolonged QTC-505.   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 -We will hold on resuming warfarin for next 1 to 2 days, if hemoglobin is stable, then will start on heparin GTT for anticoagulation(given also he likely will need permanent access procedures)  Seizure history-  -continue home Keppra,Lamictal  diabetes mellitus on insulin 04/10/19 Hgba1c7.9. -continue Lantus at reduced dose45 u BID,  -Hold home premeal insulin, monthly emgality,weekly Trulicity,Jardiance. - SSI  # Chronic respiratory failure on home 3L O2 # OSA and OHS --continue suppl O2 --continuous pulse ox -Does not use CPAP at home  Morbid obesity BMI54  Depression bipolar  disorder -Hold home buspirone, Cymbalta, Lyrica  Chronic pain syndrome -Hold home oral PRN opioids  due to increased lethargy and now poor kidney clearance --Pt currently has a Fentanyl patch 50 on, due for exchange on 12/16.  It was resumed   DVT prophylaxis: on warfarin  Code Status: Full code, patient reports he would leave decision up to his wife to make, does not wish to be kept alive on life support if he is vegetable.  Family Communication: none at bedside Consults called: nephrology , IR, urology Disposition Plan: remains inpatient     Objective: Vitals:   06/16/19 0930 06/16/19 1000 06/16/19 1030 06/16/19 1053  BP: (!) 96/57 116/64 134/61 (!) 116/53  Pulse: 74 73 73 73  Resp:    20  Temp:    97.6 F (36.4 C)  TempSrc:    Oral  SpO2:    99%  Weight:      Height:        Intake/Output Summary (Last 24 hours) at 06/16/2019 1432 Last data filed at 06/16/2019 1130 Gross per 24 hour  Intake 1235.56 ml  Output 2500 ml  Net -1264.44 ml   Filed Weights   06/14/19 2127  Weight: (!) 190.4 kg    Examination:   Awake Alert, Oriented X 3, No new F.N deficits, Normal affect Symmetrical Chest wall movement, diminished air entry at the bases, no wheezing RRR,No Gallops,Rubs or new Murmurs, No Parasternal Heave +ve B.Sounds, Abd Soft, No tenderness, No rebound - guarding or rigidity. No Cyanosis, right shoulder and knee bruising from fall     Data Reviewed: I have personally reviewed following labs and imaging studies  CBC: Recent Labs  Lab 06/11/19 1615 06/12/19 0508 06/13/19 0824 06/13/19 1718 06/14/19 0534 06/15/19 0746 06/15/19 2000 06/16/19 0507  WBC 4.5 4.3 3.8*  --  3.9* 3.7*  --  3.5*  NEUTROABS 2.8  --   --   --   --   --   --   --   HGB 6.9* 7.4* 6.5* 6.7* 6.8* 6.7* 7.8* 7.5*  HCT 22.5* 23.9* 21.4* 21.3* 20.7* 20.2* 24.3* 23.6*  MCV 98.7 98.8 99.1  --  95.0 94.4  --  95.9  PLT 90* 83* 69*  --  71* 69*  --  69*   Basic Metabolic Panel: Recent  Labs  Lab 06/11/19 1615 06/12/19 0508 06/13/19 0824 06/14/19 0534 06/15/19 0745 06/16/19 0507  NA 136 139 137 136 136 135  K 4.1 4.3 4.4 4.2 4.9 4.4  CL 95* 99 100 98 99 98  CO2 25 27 25  21* 21* 23  GLUCOSE 128* 89 145* 138* 117* 124*  BUN 65* 69* 74* 76* 87* 64*  CREATININE 9.42* 9.98* 11.54* 13.10* 14.31* 11.08*  CALCIUM 8.3* 8.4* 7.9* 7.8* 8.0* 7.8*  MG 2.5*  --  2.5* 2.5*  --   --   PHOS  --   --  9.1*  --  10.7* 8.4*   GFR: Estimated Creatinine Clearance: 14 mL/min (A) (by C-G formula based on SCr of 11.08 mg/dL (H)). Liver Function Tests: Recent Labs  Lab 06/11/19 1615 06/15/19 0745 06/16/19 0507  AST 19  --   --   ALT 10  --   --   ALKPHOS 59  --   --   BILITOT 0.8  --   --   PROT 6.9  --   --   ALBUMIN 2.4* 2.0* 2.0*   Recent Labs  Lab 06/11/19 1615  LIPASE 25   Recent Labs  Lab 06/11/19 1615  AMMONIA 23   Coagulation Profile: Recent Labs  Lab 06/11/19 1615 06/13/19 1158 06/15/19 0825 06/16/19 0507  INR 2.8* 2.6* 1.8* 1.4*   Cardiac Enzymes: No results for input(s): CKTOTAL, CKMB, CKMBINDEX, TROPONINI in the last 168 hours. BNP (last 3 results) No results for input(s): PROBNP in the last 8760 hours. HbA1C: No results for input(s): HGBA1C in the last 72 hours. CBG: Recent Labs  Lab 06/15/19 1117 06/15/19 1608 06/15/19 2029 06/16/19 0707 06/16/19 1142  GLUCAP 93 109* 143* 116* 115*   Lipid Profile: No results for input(s): CHOL, HDL, LDLCALC, TRIG, CHOLHDL, LDLDIRECT in the last 72 hours. Thyroid Function Tests: No results for input(s): TSH, T4TOTAL, FREET4, T3FREE, THYROIDAB in the last 72 hours. Anemia Panel: No results for input(s): VITAMINB12, FOLATE, FERRITIN, TIBC, IRON, RETICCTPCT in the last 72 hours. Sepsis Labs: No results for input(s): PROCALCITON, LATICACIDVEN in the last 168 hours.  Recent Results (from the past 240 hour(s))  SARS CORONAVIRUS 2 (TAT 6-24 HRS) Nasopharyngeal Nasopharyngeal Swab     Status: None    Collection Time: 06/11/19  6:23 PM   Specimen: Nasopharyngeal Swab  Result Value Ref Range Status   SARS Coronavirus 2 NEGATIVE NEGATIVE Final    Comment: (NOTE) SARS-CoV-2 target nucleic acids are NOT DETECTED. The SARS-CoV-2 RNA is generally detectable in upper and lower respiratory specimens during the acute phase of infection. Negative results do not preclude SARS-CoV-2 infection, do not rule out co-infections with other pathogens, and should not be used as the sole basis for treatment or other patient management decisions. Negative results must be combined with clinical observations, patient history, and epidemiological information. The expected result is Negative. Fact Sheet for Patients: SugarRoll.be Fact Sheet for Healthcare Providers: https://www.woods-mathews.com/ This test is not yet approved or cleared by the [place] FDA and  has been authorized for detection and/or diagnosis of SARS-CoV-2 by FDA under an Emergency Use Authorization (EUA). This EUA will remain  in effect (meaning this test can be used) for the duration of the COVID-19 declaration under Section 56 4(b)(1) of the Act, 21 U.S.C. section 360bbb-3(b)(1), unless the authorization is terminated or revoked sooner. Performed at [facility], [place] [address] [place], [place] [address]   MRSA PCR Screening     Status: None   Collection Time: 06/15/19  4:40 AM   Specimen: Nasal Mucosa; Nasopharyngeal  Result Value Ref Range Status   MRSA by PCR NEGATIVE NEGATIVE Final    Comment:        The GeneXpert MRSA Assay (FDA approved for NASAL specimens only), is one component of a comprehensive MRSA colonization surveillance program. It is not intended to diagnose MRSA infection nor to guide or monitor treatment for MRSA infections. Performed at [facility], [place] [address] [place], [place] [address]       Radiology Studies: IR Fluoro Guide CV Line  Right  Result Date: 06/14/2019 INDICATION: 53 year old male with acute renal failure in need of hemodialysis. He is currently anticoagulated and thrombocytopenic. Therefore, a temporary non tunneled hemodialysis catheter will be placed. EXAM: IR RIGHT FLUORO GUIDE CV LINE; IR ULTRASOUND GUIDANCE VASC ACCESS RIGHT MEDICATIONS: None ANESTHESIA/SEDATION: None FLUOROSCOPY TIME:  Fluoroscopy Time: 0 minutes 18 seconds (5.3 mGy). COMPLICATIONS: None immediate. PROCEDURE: Informed written consent was obtained from the patient after a thorough discussion of the procedural risks, benefits and alternatives. All questions were addressed. Maximal Sterile Barrier Technique was utilized including caps, mask, sterile gowns, sterile gloves, sterile drape, hand hygiene and skin antiseptic. A timeout was performed prior to the initiation  of the procedure. The right internal jugular vein was interrogated with ultrasound and found to be widely patent. An image was obtained and stored for the medical record. Local anesthesia was attained by infiltration with 1% lidocaine. A small dermatotomy was made. Under real-time sonographic guidance, the vessel was punctured with a 21 gauge micropuncture needle. Using standard technique, the initial micro needle was exchanged over a 0.018 micro wire for a transitional 4 [place] micro sheath. The micro sheath was then exchanged over a 0.035 wire for a fascial dilator and the soft tissue tract was dilated. A 20 cm triple-lumen non tunneled hemodialysis catheter was then advanced over the wire and position with the tip in the upper right atrium. The catheter flushes and aspirates easily. The catheter was flushed with heparinized saline and secured to the skin with 0 Prolene suture. A sterile bandage was applied. The catheter hubs were capped. IMPRESSION: Successful placement of a right IJ approach 20 cm triple-lumen non tunneled hemodialysis catheter. The catheter tip is in the upper right atrium  and ready for immediate use. Electronically Signed   By: Jacqulynn Cadet M.D.   On: 06/14/2019 18:59   IR US Guide Vasc Access Right  Result Date: 06/14/2019 INDICATION: 53 year old male with acute renal failure in need of hemodialysis. He is currently anticoagulated and thrombocytopenic. Therefore, a temporary non tunneled hemodialysis catheter will be placed. EXAM: IR RIGHT FLUORO GUIDE CV LINE; IR ULTRASOUND GUIDANCE VASC ACCESS RIGHT MEDICATIONS: None ANESTHESIA/SEDATION: None FLUOROSCOPY TIME:  Fluoroscopy Time: 0 minutes 18 seconds (5.3 mGy). COMPLICATIONS: None immediate. PROCEDURE: Informed written consent was obtained from the patient after a thorough discussion of the procedural risks, benefits and alternatives. All questions were addressed. Maximal Sterile Barrier Technique was utilized including caps, mask, sterile gowns, sterile gloves, sterile drape, hand hygiene and skin antiseptic. A timeout was performed prior to the initiation of the procedure. The right internal jugular vein was interrogated with ultrasound and found to be widely patent. An image was obtained and stored for the medical record. Local anesthesia was attained by infiltration with 1% lidocaine. A small dermatotomy was made. Under real-time sonographic guidance, the vessel was punctured with a 21 gauge micropuncture needle. Using standard technique, the initial micro needle was exchanged over a 0.018 micro wire for a transitional 4 [place] micro sheath. The micro sheath was then exchanged over a 0.035 wire for a fascial dilator and the soft tissue tract was dilated. A 20 cm triple-lumen non tunneled hemodialysis catheter was then advanced over the wire and position with the tip in the upper right atrium. The catheter flushes and aspirates easily. The catheter was flushed with heparinized saline and secured to the skin with 0 Prolene suture. A sterile bandage was applied. The catheter hubs were capped. IMPRESSION: Successful  placement of a right IJ approach 20 cm triple-lumen non tunneled hemodialysis catheter. The catheter tip is in the upper right atrium and ready for immediate use. Electronically Signed   By: Jacqulynn Cadet M.D.   On: 06/14/2019 18:59   VAS [place] UPPER EXT VEIN MAPPING (PRE-OP AVF)  Result Date: 06/15/2019 UPPER EXTREMITY VEIN MAPPING  Indications: Pre-access. Limitations: patient body habitus, patient positioning, patient movement Comparison Study: No prior studies. Performing Technologist: Oliver Hum RVT  Examination Guidelines: A complete evaluation includes B-mode imaging, spectral Doppler, color Doppler, and power Doppler as needed of all accessible portions of each vessel. Bilateral testing is considered an integral part of a complete examination. Limited examinations for reoccurring indications may be  performed as noted. +-----------------+-------------+----------+---------+ Right Cephalic   Diameter (cm)Depth (cm)Findings  +-----------------+-------------+----------+---------+ Shoulder             0.54        1.41             +-----------------+-------------+----------+---------+ Prox upper arm       0.40        1.47   branching +-----------------+-------------+----------+---------+ Mid upper arm        0.48        0.99             +-----------------+-------------+----------+---------+ Dist upper arm       0.60        0.51             +-----------------+-------------+----------+---------+ Antecubital fossa    0.42        0.41   branching +-----------------+-------------+----------+---------+ Prox forearm         0.21        0.80             +-----------------+-------------+----------+---------+ Mid forearm          0.17        0.75             +-----------------+-------------+----------+---------+ Dist forearm         0.14        0.20             +-----------------+-------------+----------+---------+  +-----------------+-------------+----------+--------------+ Right Basilic    Diameter (cm)Depth (cm)   Findings    +-----------------+-------------+----------+--------------+ Shoulder                                not visualized +-----------------+-------------+----------+--------------+ Prox upper arm                          not visualized +-----------------+-------------+----------+--------------+ Mid upper arm                           not visualized +-----------------+-------------+----------+--------------+ Dist upper arm                          not visualized +-----------------+-------------+----------+--------------+ Antecubital fossa                       not visualized +-----------------+-------------+----------+--------------+ Prox forearm                            not visualized +-----------------+-------------+----------+--------------+ Mid forearm                             not visualized +-----------------+-------------+----------+--------------+ Distal forearm                          not visualized +-----------------+-------------+----------+--------------+ +-----------------+-------------+----------+---------+ Left Cephalic    Diameter (cm)Depth (cm)Findings  +-----------------+-------------+----------+---------+ Shoulder             0.68        3.10             +-----------------+-------------+----------+---------+ Prox upper arm       0.44        1.16             +-----------------+-------------+----------+---------+ Mid upper arm  0.41        0.84             +-----------------+-------------+----------+---------+ Dist upper arm       0.57        0.43             +-----------------+-------------+----------+---------+ Antecubital fossa    0.42        0.46   branching +-----------------+-------------+----------+---------+ Prox forearm         0.34        0.72              +-----------------+-------------+----------+---------+ Mid forearm          0.34        0.76   branching +-----------------+-------------+----------+---------+ Dist forearm         0.33        0.37             +-----------------+-------------+----------+---------+ *See table(s) above for measurements and observations.  Diagnosing physician: Servando Snare MD Electronically signed by Servando Snare MD on 06/15/2019 at 3:37:10 PM.    Final      Scheduled Meds: . sodium chloride   Intravenous Once  . sodium chloride   Intravenous Once  . calcium acetate  1,334 mg Oral TID WC  . Chlorhexidine Gluconate Cloth  6 each Topical Daily  . Chlorhexidine Gluconate Cloth  6 each Topical Q0600  . Chlorhexidine Gluconate Cloth  6 each Topical Q0600  . [START ON 06/20/2019] darbepoetin (ARANESP) injection - DIALYSIS  60 mcg Intravenous Q Tue-HD  . fentaNYL  1 patch Transdermal Q3 days  . finasteride  5 mg Oral Daily  . insulin aspart  0-9 Units Subcutaneous TID WC  . insulin glargine  45 Units Subcutaneous BID  . lamoTRIgine  200 mg Oral BID  . levETIRAcetam  1,000 mg Oral BID  . simvastatin  20 mg Oral QHS  . tamsulosin  0.4 mg Oral Daily   Continuous Infusions: . sodium chloride 250 mL (06/15/19 1130)  . ferric gluconate (FERRLECIT/NULECIT) IV Stopped (06/16/19 1051)     LOS: 5 days     Phillips Climes, MD [facility] If 7PM-7AM, please contact night-coverage 06/16/2019, 2:32 PM

## 2019-06-16 NOTE — Consult Note (Signed)
   [facility] CM Inpatient Consult   06/16/2019  COCHISE WESP 16-Feb-1966 [id number]    Patient screened for 58% extreme high risk score for unplanned readmission with 4 hospitalizations and 2 ED visits in the past 6 months; as well as check for potential needs of [facility] ([place] care management services under his [employer] benefit.  Review of patient's medical record and MD note on 06/15/19 reveals as follows: 53 y.o.male has multiple medical problems including morbid obesity, OSA/OHS, h/o pulmonary embolus, seizure disorder, DM, HTN, RA, migraines, thrombocytopenia, Bipolar disorder, CKD stage 4 (baseline Cr 3.2-4.10followed by Dr. Moshe Cipro), and multiple hospitalizations over the past 2 months including10/11-10/27 due to covid pneumonia with acute on chronic respiratory failure, 11/11-11/16/20 due to AKI and urosepsis, then most recently 12/6-12/10/20 with dysuria and pyuria accompanied with AKI/CKD.Since discharge, he has not been feeling well and noted significant decrease in UOP. Had gross hematuria prior to admission but had cleared up.    presented with worsening creatinine, anuria, renal has been consulted, recommendation to initiate hemodialysis. (Acute-on-chronic kidney injury)  Primary Care Provider is Evelina Dun, FNP with [facility]. This is an Warden/ranger and this office also provides the (TOC) transition of care follow up.  Transition of care CM note shows that patient will likely transition to home with home health services (resume with [facility]).  Plan:  Follow for progress and disposition/ needs with Inpatient transition of care team. Will notify [facility] CM (care management) team of patient's discharge disposition.   Of note, [facility] Care Management services does not replace or interfere with any services that are arranged by transition of care case management or social work.    For questions or changes  in disposition, please contact:   Eleisha Branscomb A. Stpehen Petitjean, BSN, RN-BC [facility] Liaison Cell: [phone]

## 2019-06-16 NOTE — Progress Notes (Signed)
Irrigated Catheter 148ml

## 2019-06-16 NOTE — Plan of Care (Signed)
  Problem: Education: Goal: Knowledge of General Education information will improve Description Including pain rating scale, medication(s)/side effects and non-pharmacologic comfort measures Outcome: Progressing   

## 2019-06-16 NOTE — Progress Notes (Signed)
[name]  Assessment/ Plan: Pt is a 53 y.o. yo male with morbid obesity, OSA, pulmonary embolus, seizure, DM, HTN, RA, bipolar, CKD 4/5 follows at [place], multiple recent hospitalization for CIVID-PNA, urosepsis, AKI admitted with decreased urine output, gross hematuria and worsening renal failure.  Transferred from [facility] to [facility] for HD.  #AKI on CKD stage IV/V: Multiple recent hospitalization and now with creatinine level of 13.8.  He is oliguric and uremic.  Status post right IJ TDC placed by IR on 12/16. First HD on 12/17 and second HD today.  Tolerating well.  Plan for serial HD. He will need outpatient HD set up on discharge Vein mapping was done.  I will consult with [facility] for permanent access.  #Gross hematuria CT scan with hemorrhagic right kidney cyst.  He has Foley catheter.  Seen by urology, on conservative management.  #Anemia due to CKD and blood loss: Iron saturation 16%.  Continue IV iron and Aranesp during HD.  Received PRBC.  #CKD-MBD: Phosphorus level elevated.  Check PTH.  Started phosphorus binders.  Lab expect to improve with HD.  #Acute encephalopathy, due to medication and uremia: Plan for HD.  Recommend to hold sedatives.  Mental status has improved.  #Morbid obesity  Subjective: Seen and examined at HD unit.  Tolerating dialysis well.  More alert today.  Has right IJ TDC.  Denies nausea vomiting.  No new event. Objective Vital signs in last 24 hours: Vitals:   06/16/19 0930 06/16/19 1000 06/16/19 1030 06/16/19 1053  BP: (!) 96/57 116/64 134/61 (!) 116/53  Pulse: 74 73 73 73  Resp:    20  Temp:    97.6 F (36.4 C)  TempSrc:    Oral  SpO2:    99%  Weight:      Height:       Weight change:   Intake/Output Summary (Last 24 hours) at 06/16/2019 1144 Last data filed at 06/16/2019 1053 Gross per 24 hour  Intake 1530.56 ml  Output 2500 ml  Net -969.44 ml       Labs: Basic Metabolic Panel: Recent Labs  Lab  06/13/19 0824 06/14/19 0534 06/15/19 0745 06/16/19 0507  NA 137 136 136 135  K 4.4 4.2 4.9 4.4  CL 100 98 99 98  CO2 25 21* 21* 23  GLUCOSE 145* 138* 117* 124*  BUN 74* 76* 87* 64*  CREATININE 11.54* 13.10* 14.31* 11.08*  CALCIUM 7.9* 7.8* 8.0* 7.8*  PHOS 9.1*  --  10.7* 8.4*   Liver Function Tests: Recent Labs  Lab 06/11/19 1615 06/15/19 0745 06/16/19 0507  AST 19  --   --   ALT 10  --   --   ALKPHOS 59  --   --   BILITOT 0.8  --   --   PROT 6.9  --   --   ALBUMIN 2.4* 2.0* 2.0*   Recent Labs  Lab 06/11/19 1615  LIPASE 25   Recent Labs  Lab 06/11/19 1615  AMMONIA 23   CBC: Recent Labs  Lab 06/11/19 1615 06/12/19 0508 06/13/19 0824 06/14/19 0534 06/15/19 0746 06/15/19 2000 06/16/19 0507  WBC 4.5 4.3 3.8* 3.9* 3.7*  --  3.5*  NEUTROABS 2.8  --   --   --   --   --   --   HGB 6.9* 7.4* 6.5* 6.8* 6.7* 7.8* 7.5*  HCT 22.5* 23.9* 21.4* 20.7* 20.2* 24.3* 23.6*  MCV 98.7 98.8 99.1 95.0 94.4  --  95.9  PLT 90* 83* 69* 71* 69*  --  69*   Cardiac Enzymes: No results for input(s): CKTOTAL, CKMB, CKMBINDEX, TROPONINI in the last 168 hours. CBG: Recent Labs  Lab 06/14/19 2126 06/15/19 1117 06/15/19 1608 06/15/19 2029 06/16/19 0707  GLUCAP 108* 93 109* 143* 116*    Iron Studies: No results for input(s): IRON, TIBC, TRANSFERRIN, FERRITIN in the last 72 hours. Studies/Results: IR Fluoro Guide CV Line Right  Result Date: 06/14/2019 INDICATION: 53 year old male with acute renal failure in need of hemodialysis. He is currently anticoagulated and thrombocytopenic. Therefore, a temporary non tunneled hemodialysis catheter will be placed. EXAM: IR RIGHT FLUORO GUIDE CV LINE; IR ULTRASOUND GUIDANCE VASC ACCESS RIGHT MEDICATIONS: None ANESTHESIA/SEDATION: None FLUOROSCOPY TIME:  Fluoroscopy Time: 0 minutes 18 seconds (5.3 mGy). COMPLICATIONS: None immediate. PROCEDURE: Informed written consent was obtained from the patient after a thorough discussion of the procedural  risks, benefits and alternatives. All questions were addressed. Maximal Sterile Barrier Technique was utilized including caps, mask, sterile gowns, sterile gloves, sterile drape, hand hygiene and skin antiseptic. A timeout was performed prior to the initiation of the procedure. The right internal jugular vein was interrogated with ultrasound and found to be widely patent. An image was obtained and stored for the medical record. Local anesthesia was attained by infiltration with 1% lidocaine. A small dermatotomy was made. Under real-time sonographic guidance, the vessel was punctured with a 21 gauge micropuncture needle. Using standard technique, the initial micro needle was exchanged over a 0.018 micro wire for a transitional 4 [place] micro sheath. The micro sheath was then exchanged over a 0.035 wire for a fascial dilator and the soft tissue tract was dilated. A 20 cm triple-lumen non tunneled hemodialysis catheter was then advanced over the wire and position with the tip in the upper right atrium. The catheter flushes and aspirates easily. The catheter was flushed with heparinized saline and secured to the skin with 0 Prolene suture. A sterile bandage was applied. The catheter hubs were capped. IMPRESSION: Successful placement of a right IJ approach 20 cm triple-lumen non tunneled hemodialysis catheter. The catheter tip is in the upper right atrium and ready for immediate use. Electronically Signed   By: Jacqulynn Cadet M.D.   On: 06/14/2019 18:59   IR US Guide Vasc Access Right  Result Date: 06/14/2019 INDICATION: 53 year old male with acute renal failure in need of hemodialysis. He is currently anticoagulated and thrombocytopenic. Therefore, a temporary non tunneled hemodialysis catheter will be placed. EXAM: IR RIGHT FLUORO GUIDE CV LINE; IR ULTRASOUND GUIDANCE VASC ACCESS RIGHT MEDICATIONS: None ANESTHESIA/SEDATION: None FLUOROSCOPY TIME:  Fluoroscopy Time: 0 minutes 18 seconds (5.3 mGy). COMPLICATIONS:  None immediate. PROCEDURE: Informed written consent was obtained from the patient after a thorough discussion of the procedural risks, benefits and alternatives. All questions were addressed. Maximal Sterile Barrier Technique was utilized including caps, mask, sterile gowns, sterile gloves, sterile drape, hand hygiene and skin antiseptic. A timeout was performed prior to the initiation of the procedure. The right internal jugular vein was interrogated with ultrasound and found to be widely patent. An image was obtained and stored for the medical record. Local anesthesia was attained by infiltration with 1% lidocaine. A small dermatotomy was made. Under real-time sonographic guidance, the vessel was punctured with a 21 gauge micropuncture needle. Using standard technique, the initial micro needle was exchanged over a 0.018 micro wire for a transitional 4 [place] micro sheath. The micro sheath was then exchanged over a 0.035 wire for a fascial dilator  and the soft tissue tract was dilated. A 20 cm triple-lumen non tunneled hemodialysis catheter was then advanced over the wire and position with the tip in the upper right atrium. The catheter flushes and aspirates easily. The catheter was flushed with heparinized saline and secured to the skin with 0 Prolene suture. A sterile bandage was applied. The catheter hubs were capped. IMPRESSION: Successful placement of a right IJ approach 20 cm triple-lumen non tunneled hemodialysis catheter. The catheter tip is in the upper right atrium and ready for immediate use. Electronically Signed   By: Jacqulynn Cadet M.D.   On: 06/14/2019 18:59   VAS [place] UPPER EXT VEIN MAPPING (PRE-OP AVF)  Result Date: 06/15/2019 UPPER EXTREMITY VEIN MAPPING  Indications: Pre-access. Limitations: patient body habitus, patient positioning, patient movement Comparison Study: No prior studies. Performing Technologist: Oliver Hum RVT  Examination Guidelines: A complete evaluation includes  B-mode imaging, spectral Doppler, color Doppler, and power Doppler as needed of all accessible portions of each vessel. Bilateral testing is considered an integral part of a complete examination. Limited examinations for reoccurring indications may be performed as noted. +-----------------+-------------+----------+---------+ Right Cephalic   Diameter (cm)Depth (cm)Findings  +-----------------+-------------+----------+---------+ Shoulder             0.54        1.41             +-----------------+-------------+----------+---------+ Prox upper arm       0.40        1.47   branching +-----------------+-------------+----------+---------+ Mid upper arm        0.48        0.99             +-----------------+-------------+----------+---------+ Dist upper arm       0.60        0.51             +-----------------+-------------+----------+---------+ Antecubital fossa    0.42        0.41   branching +-----------------+-------------+----------+---------+ Prox forearm         0.21        0.80             +-----------------+-------------+----------+---------+ Mid forearm          0.17        0.75             +-----------------+-------------+----------+---------+ Dist forearm         0.14        0.20             +-----------------+-------------+----------+---------+ +-----------------+-------------+----------+--------------+ Right Basilic    Diameter (cm)Depth (cm)   Findings    +-----------------+-------------+----------+--------------+ Shoulder                                not visualized +-----------------+-------------+----------+--------------+ Prox upper arm                          not visualized +-----------------+-------------+----------+--------------+ Mid upper arm                           not visualized +-----------------+-------------+----------+--------------+ Dist upper arm                          not visualized  +-----------------+-------------+----------+--------------+ Antecubital fossa  not visualized +-----------------+-------------+----------+--------------+ Prox forearm                            not visualized +-----------------+-------------+----------+--------------+ Mid forearm                             not visualized +-----------------+-------------+----------+--------------+ Distal forearm                          not visualized +-----------------+-------------+----------+--------------+ +-----------------+-------------+----------+---------+ Left Cephalic    Diameter (cm)Depth (cm)Findings  +-----------------+-------------+----------+---------+ Shoulder             0.68        3.10             +-----------------+-------------+----------+---------+ Prox upper arm       0.44        1.16             +-----------------+-------------+----------+---------+ Mid upper arm        0.41        0.84             +-----------------+-------------+----------+---------+ Dist upper arm       0.57        0.43             +-----------------+-------------+----------+---------+ Antecubital fossa    0.42        0.46   branching +-----------------+-------------+----------+---------+ Prox forearm         0.34        0.72             +-----------------+-------------+----------+---------+ Mid forearm          0.34        0.76   branching +-----------------+-------------+----------+---------+ Dist forearm         0.33        0.37             +-----------------+-------------+----------+---------+ *See table(s) above for measurements and observations.  Diagnosing physician: Servando Snare MD Electronically signed by Servando Snare MD on 06/15/2019 at 3:37:10 PM.    Final     Medications: Infusions: . sodium chloride 250 mL (06/15/19 1130)  . ferric gluconate (FERRLECIT/NULECIT) IV Stopped (06/16/19 1051)    Scheduled Medications: . sodium chloride    Intravenous Once  . sodium chloride   Intravenous Once  . calcium acetate  667 mg Oral TID WC  . Chlorhexidine Gluconate Cloth  6 each Topical Daily  . Chlorhexidine Gluconate Cloth  6 each Topical Q0600  . [START ON 06/20/2019] darbepoetin (ARANESP) injection - DIALYSIS  60 mcg Intravenous Q Tue-HD  . fentaNYL  1 patch Transdermal Q3 days  . finasteride  5 mg Oral Daily  . insulin aspart  0-9 Units Subcutaneous TID WC  . insulin glargine  45 Units Subcutaneous BID  . lamoTRIgine  200 mg Oral BID  . levETIRAcetam  1,000 mg Oral BID  . simvastatin  20 mg Oral QHS  . tamsulosin  0.4 mg Oral Daily    have reviewed scheduled and prn medications.  Physical Exam: General: Morbidly obese male lying in bed, not in distress Heart:RRR, s1s2 nl, no rub Lungs: Distant breath sound, no crackle appreciated Abdomen:soft, Non-tender, non-distended Extremities: Bilateral lower extremity edema Neurology: More alert awake and pleasant. Dialysis Access: Right IJ TDC placed by IR on 12/16.  Alek Poncedeleon Prasad Eurydice Calixto 06/16/2019,11:44 AM  LOS: 5 days  Pager: ID:[phone]

## 2019-06-17 LAB — CBC
HCT: 23 % — ABNORMAL LOW (ref 39.0–52.0)
Hemoglobin: 7.5 g/dL — ABNORMAL LOW (ref 13.0–17.0)
MCH: 31.3 pg (ref 26.0–34.0)
MCHC: 32.6 g/dL (ref 30.0–36.0)
MCV: 95.8 fL (ref 80.0–100.0)
Platelets: 68 10*3/uL — ABNORMAL LOW (ref 150–400)
RBC: 2.4 MIL/uL — ABNORMAL LOW (ref 4.22–5.81)
RDW: 14.5 % (ref 11.5–15.5)
WBC: 3.5 10*3/uL — ABNORMAL LOW (ref 4.0–10.5)
nRBC: 0 % (ref 0.0–0.2)

## 2019-06-17 LAB — GLUCOSE, CAPILLARY
Glucose-Capillary: 113 mg/dL — ABNORMAL HIGH (ref 70–99)
Glucose-Capillary: 84 mg/dL (ref 70–99)
Glucose-Capillary: 108 mg/dL — ABNORMAL HIGH (ref 70–99)
Glucose-Capillary: 163 mg/dL — ABNORMAL HIGH (ref 70–99)

## 2019-06-17 LAB — PROTIME-INR
INR: 1.3 — ABNORMAL HIGH (ref 0.8–1.2)
Prothrombin Time: 15.7 seconds — ABNORMAL HIGH (ref 11.4–15.2)

## 2019-06-17 LAB — PARATHYROID HORMONE, INTACT (NO CA): PTH: 86 pg/mL — ABNORMAL HIGH (ref 15–65)

## 2019-06-17 LAB — RENAL FUNCTION PANEL
Albumin: 1.9 g/dL — ABNORMAL LOW (ref 3.5–5.0)
Anion gap: 12 (ref 5–15)
BUN: 50 mg/dL — ABNORMAL HIGH (ref 6–20)
CO2: 26 mmol/L (ref 22–32)
Calcium: 8 mg/dL — ABNORMAL LOW (ref 8.9–10.3)
Chloride: 97 mmol/L — ABNORMAL LOW (ref 98–111)
Creatinine, Ser: 8.43 mg/dL — ABNORMAL HIGH (ref 0.61–1.24)
GFR calc Af Amer: 8 mL/min — ABNORMAL LOW (ref >60)
GFR calc non Af Amer: 7 mL/min — ABNORMAL LOW (ref >60)
Glucose, Bld: 115 mg/dL — ABNORMAL HIGH (ref 70–99)
Phosphorus: 6.3 mg/dL — ABNORMAL HIGH (ref 2.5–4.6)
Potassium: 4.1 mmol/L (ref 3.5–5.1)
Sodium: 135 mmol/L (ref 135–145)

## 2019-06-17 MED ORDER — LIDOCAINE HCL (PF) 1 % IJ SOLN: 5.0000 mL | INTRAMUSCULAR | Status: DC | PRN

## 2019-06-17 MED ORDER — SODIUM CHLORIDE 0.9 % IV SOLN: 100.0000 mL | INTRAVENOUS | Status: DC | PRN

## 2019-06-17 MED ORDER — PENTAFLUOROPROP-TETRAFLUOROETH EX AERO: 1.0000 "application " | INHALATION_SPRAY | CUTANEOUS | Status: DC | PRN

## 2019-06-17 MED ORDER — HEPARIN SODIUM (PORCINE) 1000 UNIT/ML DIALYSIS: 1000.0000 [IU] | INTRAMUSCULAR | Status: DC | PRN

## 2019-06-17 MED ORDER — ALTEPLASE 2 MG IJ SOLR: 2.0000 mg | Freq: Once | INTRAMUSCULAR | Status: DC | PRN

## 2019-06-17 MED ORDER — HEPARIN SODIUM (PORCINE) 1000 UNIT/ML IJ SOLN
INTRAMUSCULAR | Status: AC
  Administered 2019-06-17: 2800 [IU] via INTRAVENOUS_CENTRAL
  Filled 2019-06-17: qty 3

## 2019-06-17 MED ORDER — LIDOCAINE-PRILOCAINE 2.5-2.5 % EX CREA: 1.0000 "application " | TOPICAL_CREAM | CUTANEOUS | Status: DC | PRN

## 2019-06-17 NOTE — Progress Notes (Signed)
Patient very needy. Constantly on the call bell requesting for assistance. Needs attended.

## 2019-06-17 NOTE — Progress Notes (Signed)
Patient awake most of the night.

## 2019-06-17 NOTE — Procedures (Signed)
Patient was seen on dialysis and the procedure was supervised.  BFR 400 Via TDC BP is  121/55.   Patient appears to be tolerating treatment well. 3rd tx today. Plan for next HD on Monday.   [name] 06/17/2019

## 2019-06-17 NOTE — Progress Notes (Signed)
PROGRESS NOTE    [name]  [id number] DOB: 20-Nov-1965 DOA: 06/11/2019 PCP: Sharion Balloon, FNP   Interval History:   53 y.o. male has multiple medical problems including morbid obesity, OSA/OHS, h/o pulmonary embolus, seizure disorder, DM, HTN, RA, migraines, thrombocytopenia, Bipolar disorder, CKD stage 4 (baseline Cr 3.2-4.1 followed by Dr. Moshe Cipro), and multiple hospitalizations over te past 2 months including 10/11-10/27 due to covid pneumonia with acute on chronic respiratory failure, 11/11-11/16/20 due to AKI and urosepsis, then most recently 12/6-12/10/20 with dysuria and pyuria accompanied with AKI/CKD.  Since discharge, he has not been feeling well and noted significant decrease in UOP .  He did have gross hematuria prior to admission but had cleared up , patient presents with worsening creatinine, anuria, renal has been consulted, recommendation to initiate hemodialysis.  Significant events: 12/13 admitted to [facility] 12/15 transferred to [facility]. 12/15 1 unit PRBC 12/16 1 unit PRBC transfusion 12/16 TDC insertion by IR 12/17 initiation hemodialysis 12/17 2 units PRBC transfusions    Subjective:  Patient denies any chest pain, shortness of breath, fever or chills, he does report poor night sleep yesterday  Assessment & Plan:   Active Problems:   Acute-on-chronic kidney injury ([place])  Acute on chronic kidney disease stage 5, anuric -creatinine 9.42 on presentation, did peak at 14.3, baseline 5.5 on recent discharge,  -He does appear to be anuric, with significant uremia on presentation. -TDC inserted by IR 12/16 -Renal input greatly appreciated, started on hemodialysis 12/17, he is currently getting serial hemodialysis, x3 days, next HD on 12/21 . -We will need permanent access, VVS is consulted by renal  Gross hematuria/hemorrhagic cyst -It remains significant problem, he was seen by urology during previous admission, patient still with  significant hematuria, requiring PRBC transfusions . -Continue to hold warfarin . -Urology consult greatly appreciated.  Plan for conservative management -Continue with Foley flushes at 200 cc every 4 hours, urine color appears to be more light today.  Acute on chronic anemia -Anemia of chronic kidney disease, and acute blood loss anemia in the setting of gross hematuria. -He required 4 units PRBC transfusion so far during hospital stay.  So far hemoglobin has been stable over last couple day with no indication for transfusion, will continue to monitor closely -On Procrit per renal.  Thrombocytopenia  -Chronic recurrent problem, in the setting of splenomegaly, so far no indication for transfusion given count today 69K.  Metabolic and toxic encephalopathy likely 2/2 uremia and sedative, improved -Lethargic but arousable initially, this has significantly improved, currently awake alert and appropriate - He was on multiple medications including narcotics and lyrica.    He is on fentanyl patch as well. -Hold home buspirone, Cymbalta, narcotics, Lyrica -continue home Keppra, lamotrigine -Mentation should improve after starting hemodialysis given he has uremia.  Recent urinary tract infection, POA -Finished 7-day of Augmentin prescribed at last discharge on 06/08/19, last day 12/16  Prolonged QTC-505.   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 -We will continue to hold on resuming anticoagulation, given acute blood loss anemia with gross hematuria and requirement for multiple PRBC transfusions, will resume on heparin GTT in 1 to 2 days if hemoglobin remained stable and hematuria continues to improve   Seizure history-  -continue home Keppra,Lamictal  diabetes mellitus on insulin 04/10/19 Hgba1c7.9. -continue Lantus at reduced dose45 u BID,  -Hold home  premeal insulin, monthly emgality,weekly  Trulicity,Jardiance. - SSI  # Chronic respiratory failure on home 3L O2 # OSA and OHS --continue suppl O2 --continuous pulse ox -Does not use CPAP at home  Morbid obesity BMI54  Depression bipolar disorder -Hold home buspirone, Cymbalta, Lyrica  Chronic pain syndrome -Hold home oral PRN opioids due to increased lethargy and now poor kidney clearance --Pt currently has a Fentanyl patch 50 on, due for exchange on 12/16.  It was resumed   DVT prophylaxis: on warfarin  Code Status: Full code, patient reports he would leave decision up to his wife to make, does not wish to be kept alive on life support if he is vegetable.  Family Communication: none at bedside Consults called: nephrology , IR, urology Disposition Plan: remains inpatient     Objective: Vitals:   06/17/19 1130 06/17/19 1200 06/17/19 1230 06/17/19 1232  BP: (!) 126/56 127/60 109/64 114/67  Pulse: 74 77 75 73  Resp:    14  Temp:    98.1 F (36.7 C)  TempSrc:    Oral  SpO2:    97%  Weight:    (!) 201.8 kg  Height:        Intake/Output Summary (Last 24 hours) at 06/17/2019 1421 Last data filed at 06/17/2019 1400 Gross per 24 hour  Intake 728.5 ml  Output 4200 ml  Net -3471.5 ml   Filed Weights   06/17/19 0830 06/17/19 1232  Weight: (!) 204.5 kg (!) 201.8 kg    Examination:   Awake Alert, Oriented X 3, No new F.N deficits, Normal affect Symmetrical Chest wall movement, Good air movement bilaterally, CTAB RRR,No Gallops,Rubs or new Murmurs, No Parasternal Heave +ve B.Sounds, Abd Soft, No tenderness, No rebound - guarding or rigidity. No Cyanosis, Clubbing , lower extremity edema is improving,       Data Reviewed: I have personally reviewed following labs and imaging studies  CBC: Recent Labs  Lab 06/11/19 1615 06/13/19 0824 06/14/19 0534 06/15/19 0746 06/15/19 2000 06/16/19 0507 06/17/19 0854  WBC 4.5 3.8* 3.9* 3.7*  --  3.5* 3.5*    NEUTROABS 2.8  --   --   --   --   --   --   HGB 6.9* 6.5* 6.8* 6.7* 7.8* 7.5* 7.5*  HCT 22.5* 21.4* 20.7* 20.2* 24.3* 23.6* 23.0*  MCV 98.7 99.1 95.0 94.4  --  95.9 95.8  PLT 90* 69* 71* 69*  --  69* 68*   Basic Metabolic Panel: Recent Labs  Lab 06/11/19 1615 06/13/19 0824 06/14/19 0534 06/15/19 0745 06/16/19 0507 06/17/19 0854  NA 136 137 136 136 135 135  K 4.1 4.4 4.2 4.9 4.4 4.1  CL 95* 100 98 99 98 97*  CO2 25 25 21* 21* 23 26  GLUCOSE 128* 145* 138* 117* 124* 115*  BUN 65* 74* 76* 87* 64* 50*  CREATININE 9.42* 11.54* 13.10* 14.31* 11.08* 8.43*  CALCIUM 8.3* 7.9* 7.8* 8.0* 7.8* 8.0*  MG 2.5* 2.5* 2.5*  --   --   --   PHOS  --  9.1*  --  10.7* 8.4* 6.3*   GFR: Estimated Creatinine Clearance: 19.1 mL/min (A) (by C-G formula based on SCr of 8.43 mg/dL (H)). Liver Function Tests: Recent Labs  Lab 06/11/19 1615 06/15/19 0745 06/16/19 0507 06/17/19 0854  AST 19  --   --   --   ALT 10  --   --   --   ALKPHOS 59  --   --   --   BILITOT 0.8  --   --   --  PROT 6.9  --   --   --   ALBUMIN 2.4* 2.0* 2.0* 1.9*   Recent Labs  Lab 06/11/19 1615  LIPASE 25   Recent Labs  Lab 06/11/19 1615  AMMONIA 23   Coagulation Profile: Recent Labs  Lab 06/11/19 1615 06/13/19 1158 06/15/19 0825 06/16/19 0507 06/17/19 0854  INR 2.8* 2.6* 1.8* 1.4* 1.3*   Cardiac Enzymes: No results for input(s): CKTOTAL, CKMB, CKMBINDEX, TROPONINI in the last 168 hours. BNP (last 3 results) No results for input(s): PROBNP in the last 8760 hours. HbA1C: No results for input(s): HGBA1C in the last 72 hours. CBG: Recent Labs  Lab 06/16/19 1142 06/16/19 1645 06/16/19 2145 06/17/19 0659 06/17/19 1320  GLUCAP 115* 122* 123* 113* 84   Lipid Profile: No results for input(s): CHOL, HDL, LDLCALC, TRIG, CHOLHDL, LDLDIRECT in the last 72 hours. Thyroid Function Tests: No results for input(s): TSH, T4TOTAL, FREET4, T3FREE, THYROIDAB in the last 72 hours. Anemia Panel: No results for  input(s): VITAMINB12, FOLATE, FERRITIN, TIBC, IRON, RETICCTPCT in the last 72 hours. Sepsis Labs: No results for input(s): PROCALCITON, LATICACIDVEN in the last 168 hours.  Recent Results (from the past 240 hour(s))  SARS CORONAVIRUS 2 (TAT 6-24 HRS) Nasopharyngeal Nasopharyngeal Swab     Status: None   Collection Time: 06/11/19  6:23 PM   Specimen: Nasopharyngeal Swab  Result Value Ref Range Status   SARS Coronavirus 2 NEGATIVE NEGATIVE Final    Comment: (NOTE) SARS-CoV-2 target nucleic acids are NOT DETECTED. The SARS-CoV-2 RNA is generally detectable in upper and lower respiratory specimens during the acute phase of infection. Negative results do not preclude SARS-CoV-2 infection, do not rule out co-infections with other pathogens, and should not be used as the sole basis for treatment or other patient management decisions. Negative results must be combined with clinical observations, patient history, and epidemiological information. The expected result is Negative. Fact Sheet for Patients: SugarRoll.be Fact Sheet for Healthcare Providers: https://www.woods-mathews.com/ This test is not yet approved or cleared by the [place] FDA and  has been authorized for detection and/or diagnosis of SARS-CoV-2 by FDA under an Emergency Use Authorization (EUA). This EUA will remain  in effect (meaning this test can be used) for the duration of the COVID-19 declaration under Section 56 4(b)(1) of the Act, 21 U.S.C. section 360bbb-3(b)(1), unless the authorization is terminated or revoked sooner. Performed at [facility], [place] [address], [place], [place] [address]   MRSA PCR Screening     Status: None   Collection Time: 06/15/19  4:40 AM   Specimen: Nasal Mucosa; Nasopharyngeal  Result Value Ref Range Status   MRSA by PCR NEGATIVE NEGATIVE Final    Comment:        The GeneXpert MRSA Assay (FDA approved for NASAL specimens only), is one  component of a comprehensive MRSA colonization surveillance program. It is not intended to diagnose MRSA infection nor to guide or monitor treatment for MRSA infections. Performed at [facility], [place] [address] [place], [place] [address]       Radiology Studies: No results found.   Scheduled Meds: . sodium chloride   Intravenous Once  . sodium chloride   Intravenous Once  . calcium acetate  1,334 mg Oral TID WC  . Chlorhexidine Gluconate Cloth  6 each Topical Daily  . Chlorhexidine Gluconate Cloth  6 each Topical Q0600  . Chlorhexidine Gluconate Cloth  6 each Topical Q0600  . [START ON 06/20/2019] darbepoetin (ARANESP) injection -  DIALYSIS  60 mcg Intravenous Q Tue-HD  . fentaNYL  1 patch Transdermal Q3 days  . finasteride  5 mg Oral Daily  . insulin aspart  0-9 Units Subcutaneous TID WC  . insulin glargine  45 Units Subcutaneous BID  . lamoTRIgine  200 mg Oral BID  . levETIRAcetam  1,000 mg Oral BID  . simvastatin  20 mg Oral QHS  . tamsulosin  0.4 mg Oral Daily   Continuous Infusions: . sodium chloride 250 mL (06/15/19 1130)  . ferric gluconate (FERRLECIT/NULECIT) IV Stopped (06/17/19 1204)     LOS: 6 days     Phillips Climes, MD [facility] If 7PM-7AM, please contact night-coverage 06/17/2019, 2:21 PM

## 2019-06-18 LAB — RENAL FUNCTION PANEL
Albumin: 2.1 g/dL — ABNORMAL LOW (ref 3.5–5.0)
Anion gap: 6 (ref 5–15)
BUN: 28 mg/dL — ABNORMAL HIGH (ref 6–20)
CO2: 30 mmol/L (ref 22–32)
Calcium: 8.5 mg/dL — ABNORMAL LOW (ref 8.9–10.3)
Chloride: 100 mmol/L (ref 98–111)
Creatinine, Ser: 4.88 mg/dL — ABNORMAL HIGH (ref 0.61–1.24)
GFR calc Af Amer: 15 mL/min — ABNORMAL LOW (ref >60)
GFR calc non Af Amer: 13 mL/min — ABNORMAL LOW (ref >60)
Glucose, Bld: 127 mg/dL — ABNORMAL HIGH (ref 70–99)
Phosphorus: 4.3 mg/dL (ref 2.5–4.6)
Potassium: 3.7 mmol/L (ref 3.5–5.1)
Sodium: 136 mmol/L (ref 135–145)

## 2019-06-18 LAB — GLUCOSE, CAPILLARY
Glucose-Capillary: 113 mg/dL — ABNORMAL HIGH (ref 70–99)
Glucose-Capillary: 154 mg/dL — ABNORMAL HIGH (ref 70–99)
Glucose-Capillary: 152 mg/dL — ABNORMAL HIGH (ref 70–99)
Glucose-Capillary: 144 mg/dL — ABNORMAL HIGH (ref 70–99)

## 2019-06-18 LAB — CBC
HCT: 26.2 % — ABNORMAL LOW (ref 39.0–52.0)
Hemoglobin: 8.2 g/dL — ABNORMAL LOW (ref 13.0–17.0)
MCH: 30.3 pg (ref 26.0–34.0)
MCHC: 31.3 g/dL (ref 30.0–36.0)
MCV: 96.7 fL (ref 80.0–100.0)
Platelets: 74 10*3/uL — ABNORMAL LOW (ref 150–400)
RBC: 2.71 MIL/uL — ABNORMAL LOW (ref 4.22–5.81)
RDW: 14.5 % (ref 11.5–15.5)
WBC: 3.1 10*3/uL — ABNORMAL LOW (ref 4.0–10.5)
nRBC: 0 % (ref 0.0–0.2)

## 2019-06-18 LAB — PROTIME-INR
INR: 1.2 (ref 0.8–1.2)
Prothrombin Time: 15.4 seconds — ABNORMAL HIGH (ref 11.4–15.2)

## 2019-06-18 LAB — HEPARIN LEVEL (UNFRACTIONATED): Heparin Unfractionated: 0.11 IU/mL — ABNORMAL LOW (ref 0.30–0.70)

## 2019-06-18 MED ORDER — SODIUM CHLORIDE 0.9 % IV SOLN
1.5000 g | INTRAVENOUS | Status: AC
  Administered 2019-06-19: 1.5 g via INTRAVENOUS
  Filled 2019-06-18 (×2): qty 1.5

## 2019-06-18 MED ORDER — HEPARIN (PORCINE) 25000 UT/250ML-% IV SOLN
2900.0000 [IU]/h | INTRAVENOUS | Status: DC
  Administered 2019-06-18: 2100 [IU]/h via INTRAVENOUS
  Administered 2019-06-19: 2450 [IU]/h via INTRAVENOUS
  Filled 2019-06-18 (×2): qty 250

## 2019-06-18 MED ORDER — CHLORHEXIDINE GLUCONATE CLOTH 2 % EX PADS: 6.0000 | MEDICATED_PAD | Freq: Every day | CUTANEOUS | Status: DC

## 2019-06-18 MED ORDER — TRAZODONE HCL 50 MG PO TABS
50.0000 mg | ORAL_TABLET | Freq: Every day | ORAL | Status: DC
  Administered 2019-06-18 – 2019-07-02 (×14): 50 mg via ORAL
  Filled 2019-06-18 (×15): qty 1

## 2019-06-18 MED ORDER — CHLORHEXIDINE GLUCONATE CLOTH 2 % EX PADS
6.0000 | MEDICATED_PAD | Freq: Every day | CUTANEOUS | Status: DC
  Administered 2019-06-18 – 2019-06-19 (×2): 6 via TOPICAL

## 2019-06-18 NOTE — Progress Notes (Signed)
PROGRESS NOTE    [name]  [id number] DOB: 1966/05/26 DOA: 06/11/2019 PCP: Sharion Balloon, FNP   Interval History:   53 y.o. male has multiple medical problems including morbid obesity, OSA/OHS, h/o pulmonary embolus, seizure disorder, DM, HTN, RA, migraines, thrombocytopenia, Bipolar disorder, CKD stage 4 (baseline Cr 3.2-4.1 followed by Dr. Moshe Cipro), and multiple hospitalizations over te past 2 months including 10/11-10/27 due to covid pneumonia with acute on chronic respiratory failure, 11/11-11/16/20 due to AKI and urosepsis, then most recently 12/6-12/10/20 with dysuria and pyuria accompanied with AKI/CKD.  Since discharge, he has not been feeling well and noted significant decrease in UOP .  He did have gross hematuria prior to admission but had cleared up , patient presents with worsening creatinine, anuria, renal has been consulted, recommendation to initiate hemodialysis.  Significant events: 12/13 admitted to [facility] 12/15 transferred to [facility]. 12/15 1 unit PRBC 12/16 1 unit PRBC transfusion 12/16 TDC insertion by IR 12/17 initiation hemodialysis 12/17 2 units PRBC transfusions    Subjective:  Patient denies reports no sleep overnight, anxious , denies any chest pain or shortness of breath .  Assessment & Plan:   Active Problems:   Acute-on-chronic kidney injury ([place])  Acute on chronic kidney disease stage 5, anuric -creatinine 9.42 on presentation, did peak at 14.3, baseline 5.5 on recent discharge,  -He does appear to be anuric, with significant uremia on presentation. -TDC inserted by IR 12/16 -Renal input greatly appreciated, started on hemodialysis 12/17, he is currently getting serial hemodialysis, x3 days, next HD on tomorrow. -We will need permanent access, VVS is consulted by renal  Gross hematuria/hemorrhagic cyst -Urology consult greatly appreciated.  Plan for conservative management -Continue with Foley flushes at 200  cc every 4 hours, hematuria appears to be resolved, no further hematuria today.  Acute on chronic anemia -Anemia of chronic kidney disease, and acute blood loss anemia in the setting of gross hematuria. -He required 4 units PRBC transfusion so far during hospital stay.  So far hemoglobin has been stable over last couple day with no indication for transfusion, will continue to monitor closely -IV Procrit and iron per renal  Thrombocytopenia  -Chronic recurrent problem, in the setting of splenomegaly, so far no indication for transfusion .  Metabolic and toxic encephalopathy likely 2/2 uremia and sedative, improved -Lethargic but arousable initially, this has significantly improved, currently awake alert and appropriate - He was on multiple medications including narcotics and lyrica.    He is on fentanyl patch as well. -Hold home buspirone, Cymbalta, narcotics, Lyrica -continue home Keppra, lamotrigine -Mentation should improve after starting hemodialysis given he has uremia.  Recent urinary tract infection, POA -Finished 7-day of Augmentin prescribed at last discharge on 06/08/19, last day 12/16  Prolonged QTC-505.   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  Seizure history-  -continue home Keppra,Lamictal  diabetes mellitus on insulin 04/10/19 [id number]. -continue Lantus at reduced dose45 u BID,  -Hold home premeal insulin, monthly emgality,weekly Trulicity,Jardiance. - SSI  # Chronic respiratory failure on home 3L O2 # OSA and OHS --continue suppl O2 --continuous pulse ox -Does not use CPAP at home  Morbid obesity BMI54  Depression bipolar disorder -Hold home buspirone,  Cymbalta,  Lyrica  Chronic pain syndrome -Hold home oral PRN opioids due to increased lethargy and now poor kidney clearance --Pt currently has a Fentanyl patch 50 on, due for exchange on 12/16.  It was resumed  Patient appears to be with increased excitability today, talkative, with insomnia, has rushed speech, he has been drinking [facility], and have discussed with him as instructed staff not to drink [facility] anymore.   DVT prophylaxis: on warfarin >> Heparin GTT Code Status: Full code, patient reports he would leave decision up to his wife to make, does not wish to be kept alive on life support if he is vegetable.  Family Communication: none at bedside Consults called: nephrology , IR, urology Disposition Plan: remains inpatient     Objective: Vitals:   06/17/19 1641 06/17/19 2118 06/18/19 0500 06/18/19 0925  BP: (!) 104/52 (!) 115/53 (!) 123/59 124/64  Pulse: 74 75 71 74  Resp: 18 19 16 18   Temp: 98.3 F (36.8 C) 98.5 F (36.9 C) 98.4 F (36.9 C) 98.6 F (37 C)  TempSrc: Oral Oral Oral Oral  SpO2: 94% 96% 95% 96%  Weight:      Height:        Intake/Output Summary (Last 24 hours) at 06/18/2019 1204 Last data filed at 06/18/2019 0900 Gross per 24 hour  Intake 520 ml  Output 3600 ml  Net -3080 ml   Filed Weights   06/17/19 0830 06/17/19 1232  Weight: (!) 204.5 kg (!) 201.8 kg    Examination:   Awake Alert, Oriented X 3, patient appears to be very talkative today, hyper excited. Symmetrical Chest wall movement, Good air movement bilaterally, CTAB RRR,No Gallops,Rubs or new Murmurs, No Parasternal Heave +ve B.Sounds, Abd Soft, No tenderness, No rebound - guarding or rigidity. No Cyanosis, or extremity edema significantly subsided       Data Reviewed: I have personally reviewed following labs and imaging studies  CBC: Recent Labs  Lab 06/11/19 1615 06/14/19 0534 06/15/19 0746 06/15/19 2000 06/16/19 0507 06/17/19 0854 06/18/19 0610  WBC  4.5 3.9* 3.7*  --  3.5* 3.5* 3.1*  NEUTROABS 2.8  --   --   --   --   --   --   HGB 6.9* 6.8* 6.7* 7.8* 7.5* 7.5* 8.2*  HCT 22.5* 20.7* 20.2* 24.3* 23.6* 23.0* 26.2*  MCV 98.7 95.0 94.4  --  95.9 95.8 96.7  PLT 90* 71* 69*  --  69* 68* 74*   Basic Metabolic Panel: Recent Labs  Lab 06/11/19 1615 06/13/19 0824 06/14/19 0534 06/15/19 0745 06/16/19 0507 06/17/19 0854 06/18/19 0610  NA 136 137 136 136 135 135 136  K 4.1 4.4 4.2 4.9 4.4 4.1 3.7  CL 95* 100 98 99 98 97* 100  CO2 25 25 21* 21* 23 26 30   GLUCOSE 128* 145* 138* 117* 124* 115* 127*  BUN 65* 74* 76* 87* 64* 50* 28*  CREATININE 9.42* 11.54* 13.10* 14.31* 11.08* 8.43* 4.88*  CALCIUM 8.3* 7.9* 7.8* 8.0* 7.8* 8.0* 8.5*  MG 2.5* 2.5* 2.5*  --   --   --   --   PHOS  --  9.1*  --  10.7* 8.4* 6.3* 4.3   GFR: Estimated Creatinine Clearance: 32.9 mL/min (A) (by C-G formula based on SCr of 4.88 mg/dL (H)). Liver Function Tests: Recent Labs  Lab 06/11/19 1615 06/15/19 0745 06/16/19 0507 06/17/19 0854 06/18/19 0610  AST 19  --   --   --   --   ALT  10  --   --   --   --   ALKPHOS 59  --   --   --   --   BILITOT 0.8  --   --   --   --   PROT 6.9  --   --   --   --   ALBUMIN 2.4* 2.0* 2.0* 1.9* 2.1*   Recent Labs  Lab 06/11/19 1615  LIPASE 25   Recent Labs  Lab 06/11/19 1615  AMMONIA 23   Coagulation Profile: Recent Labs  Lab 06/13/19 1158 06/15/19 0825 06/16/19 0507 06/17/19 0854 06/18/19 0610  INR 2.6* 1.8* 1.4* 1.3* 1.2   Cardiac Enzymes: No results for input(s): CKTOTAL, CKMB, CKMBINDEX, TROPONINI in the last 168 hours. BNP (last 3 results) No results for input(s): PROBNP in the last 8760 hours. HbA1C: No results for input(s): HGBA1C in the last 72 hours. CBG: Recent Labs  Lab 06/17/19 1320 06/17/19 1611 06/17/19 2119 06/18/19 0714 06/18/19 1126  GLUCAP 84 108* 163* 113* 154*   Lipid Profile: No results for input(s): CHOL, HDL, LDLCALC, TRIG, CHOLHDL, LDLDIRECT in the last 72 hours. Thyroid  Function Tests: No results for input(s): TSH, T4TOTAL, FREET4, T3FREE, THYROIDAB in the last 72 hours. Anemia Panel: No results for input(s): VITAMINB12, FOLATE, FERRITIN, TIBC, IRON, RETICCTPCT in the last 72 hours. Sepsis Labs: No results for input(s): PROCALCITON, LATICACIDVEN in the last 168 hours.  Recent Results (from the past 240 hour(s))  SARS CORONAVIRUS 2 (TAT 6-24 HRS) Nasopharyngeal Nasopharyngeal Swab     Status: None   Collection Time: 06/11/19  6:23 PM   Specimen: Nasopharyngeal Swab  Result Value Ref Range Status   SARS Coronavirus 2 NEGATIVE NEGATIVE Final    Comment: (NOTE) SARS-CoV-2 target nucleic acids are NOT DETECTED. The SARS-CoV-2 RNA is generally detectable in upper and lower respiratory specimens during the acute phase of infection. Negative results do not preclude SARS-CoV-2 infection, do not rule out co-infections with other pathogens, and should not be used as the sole basis for treatment or other patient management decisions. Negative results must be combined with clinical observations, patient history, and epidemiological information. The expected result is Negative. Fact Sheet for Patients: SugarRoll.be Fact Sheet for Healthcare Providers: https://www.woods-mathews.com/ This test is not yet approved or cleared by the [place] FDA and  has been authorized for detection and/or diagnosis of SARS-CoV-2 by FDA under an Emergency Use Authorization (EUA). This EUA will remain  in effect (meaning this test can be used) for the duration of the COVID-19 declaration under Section 56 4(b)(1) of the Act, 21 U.S.C. section 360bbb-3(b)(1), unless the authorization is terminated or revoked sooner. Performed at [facility], [place] [address], [place], [place] [address]   MRSA PCR Screening     Status: None   Collection Time: 06/15/19  4:40 AM   Specimen: Nasal Mucosa; Nasopharyngeal  Result Value Ref Range Status    MRSA by PCR NEGATIVE NEGATIVE Final    Comment:        The GeneXpert MRSA Assay (FDA approved for NASAL specimens only), is one component of a comprehensive MRSA colonization surveillance program. It is not intended to diagnose MRSA infection nor to guide or monitor treatment for MRSA infections. Performed at [facility], [place] [address], [place], [place] [address]       Radiology Studies: No results found.   Scheduled Meds: . sodium chloride   Intravenous Once  . sodium chloride   Intravenous Once  .  calcium acetate  1,334 mg Oral TID WC  . Chlorhexidine Gluconate Cloth  6 each Topical Q0600  . Chlorhexidine Gluconate Cloth  6 each Topical Q0600  . [START ON 06/20/2019] darbepoetin (ARANESP) injection - DIALYSIS  60 mcg Intravenous Q Tue-HD  . fentaNYL  1 patch Transdermal Q3 days  . finasteride  5 mg Oral Daily  . insulin aspart  0-9 Units Subcutaneous TID WC  . insulin glargine  45 Units Subcutaneous BID  . lamoTRIgine  200 mg Oral BID  . levETIRAcetam  1,000 mg Oral BID  . simvastatin  20 mg Oral QHS  . tamsulosin  0.4 mg Oral Daily   Continuous Infusions: . sodium chloride 250 mL (06/15/19 1130)  . [START ON 06/19/2019] cefUROXime (ZINACEF)  IV    . ferric gluconate (FERRLECIT/NULECIT) IV Stopped (06/17/19 1204)     LOS: 7 days     Phillips Climes, MD [facility] If 7PM-7AM, please contact night-coverage 06/18/2019, 12:04 PM

## 2019-06-18 NOTE — Progress Notes (Signed)
ANTICOAGULATION CONSULT NOTE - [place] for heparin IV Indication: h/o pulmonary embolus and DVT  Allergies  Allergen Reactions  . Bee Venom Anaphylaxis, Shortness Of Breath and Swelling  . Other Shortness Of Breath    Itching, rash with IVP DYE, iodine, shellfish LATEX  . Shellfish Allergy Nausea And Vomiting and Other (See Comments)    Feels like insides are twisting  . Iodinated Diagnostic Agents     Other reaction(s): RASH  . Iohexol      Code: RASH, Desc: PT WAS ON PREDNISONE FOR GOUT TX. @ TIME OF SCAN AND RECEIVED 50 MG OF BENADRYL IV-ARS 10/08/07   . Iodine Rash  . Latex Rash    Patient Measurements: Height: 6\' 3"  (190.5 cm) Weight: (!) 444 lb 14.2 oz (201.8 kg) IBW/kg (Calculated) : 84.5 Heparin Dosing Weight: 131.1 kg  Vital Signs: Temp: 99.1 F (37.3 C) (12/20 1623) Temp Source: Oral (12/20 1623) BP: 128/65 (12/20 1623) Pulse Rate: 76 (12/20 1623)  Labs: Recent Labs    06/16/19 0507 06/17/19 0854 06/18/19 0610 06/18/19 1940  HGB 7.5* 7.5* 8.2*  --   HCT 23.6* 23.0* 26.2*  --   PLT 69* 68* 74*  --   LABPROT 17.3* 15.7* 15.4*  --   INR 1.4* 1.3* 1.2  --   HEPARINUNFRC  --   --   --  0.11*  CREATININE 11.08* 8.43* 4.88*  --     Estimated Creatinine Clearance: 32.9 mL/min (A) (by C-G formula based on SCr of 4.88 mg/dL (H)).   Medical History: Past Medical History:  Diagnosis Date  . Anemia   . Anxiety   . Arthritis   . Bipolar 1 disorder ([place])   . Bronchitis    hx of  . Bruises easily   . Chronic kidney disease    decreased left kidney fx  . Chronic pain syndrome 05/11/2012  . Chronic respiratory failure with hypoxia ([facility])    And with hypercapnia  . Diabetes mellitus without complication ([place])   . Diabetic neuropathy ([place])   . DVT (deep venous thrombosis) ([facility])    LLE DVT ~ '12  . Dyspnea    with ambulation  . GERD (gastroesophageal reflux disease)   . History of 2019 novel coronavirus disease (COVID-19)   .  HOH (hard of hearing) 2015   has hearing aids  . Mental disorder   . Migraine   . Neuromuscular disorder ([place])   . Obesity hypoventilation syndrome ([place])   . Obstructive sleep apnea    CPAP  . PE (pulmonary embolism)    bilateral PE ~ '11  . Seizures ([place])   . Thrombocytopenia ([place]) 05/11/2012    Medications:  Scheduled:  . sodium chloride   Intravenous Once  . sodium chloride   Intravenous Once  . calcium acetate  1,334 mg Oral TID WC  . Chlorhexidine Gluconate Cloth  6 each Topical Q0600  . Chlorhexidine Gluconate Cloth  6 each Topical Q0600  . [START ON 06/20/2019] darbepoetin (ARANESP) injection - DIALYSIS  60 mcg Intravenous Q Tue-HD  . fentaNYL  1 patch Transdermal Q3 days  . finasteride  5 mg Oral Daily  . insulin aspart  0-9 Units Subcutaneous TID WC  . insulin glargine  45 Units Subcutaneous BID  . lamoTRIgine  200 mg Oral BID  . levETIRAcetam  1,000 mg Oral BID  . simvastatin  20 mg Oral QHS  . tamsulosin  0.4 mg Oral Daily  . traZODone  50 mg  Oral QHS   Infusions:  . sodium chloride 250 mL (06/15/19 1130)  . [START ON 06/19/2019] cefUROXime (ZINACEF)  IV    . ferric gluconate (FERRLECIT/NULECIT) IV Stopped (06/17/19 1204)  . heparin 2,100 Units/hr (06/18/19 1249)    Assessment: 53 y/o male with a PMH significant for DVT/PE on warfarin PTA. Warfarin was held due to gross hematuria. Last dose 12/12 at 1700. Today, hematuria has resolved per Dr.Elgergaway. Hgb stable at 8.2, Plt low at 74 but relatively stable.   Pharmacy has been consulted to dose heparin IV for DVT/PE with no bolus with a heparin level goal at the lower end. Initial heparin level is subtherapeutic at 0.11. No issues with infusion per RN and hematuria remains resolved.  Goal of Therapy:  Heparin level 0.3-0.5 Monitor platelets by anticoagulation protocol: Yes   Plan:  -Increase heparin to 2450 units/h -Recheck heparin level in [place], PharmD, BCPS Clinical  Pharmacist [phone] Please check AMION for all [place] numbers 06/18/2019

## 2019-06-18 NOTE — Progress Notes (Signed)
ANTICOAGULATION CONSULT NOTE - Initial Consult  Pharmacy Consult for heparin IV Indication: h/o pulmonary embolus and DVT  Allergies  Allergen Reactions  . Bee Venom Anaphylaxis, Shortness Of Breath and Swelling  . Other Shortness Of Breath    Itching, rash with IVP DYE, iodine, shellfish LATEX  . Shellfish Allergy Nausea And Vomiting and Other (See Comments)    Feels like insides are twisting  . Iodinated Diagnostic Agents     Other reaction(s): RASH  . Iohexol      Code: RASH, Desc: PT WAS ON PREDNISONE FOR GOUT TX. @ TIME OF SCAN AND RECEIVED 50 MG OF BENADRYL IV-ARS 10/08/07   . Iodine Rash  . Latex Rash    Patient Measurements: Height: 6\' 3"  (190.5 cm) Weight: (!) 444 lb 14.2 oz (201.8 kg) IBW/kg (Calculated) : 84.5 Heparin Dosing Weight: 131.1 kg  Vital Signs: Temp: 98.6 F (37 C) (12/20 0925) Temp Source: Oral (12/20 0925) BP: 124/64 (12/20 0925) Pulse Rate: 74 (12/20 0925)  Labs: Recent Labs    06/16/19 0507 06/17/19 0854 06/18/19 0610  HGB 7.5* 7.5* 8.2*  HCT 23.6* 23.0* 26.2*  PLT 69* 68* 74*  LABPROT 17.3* 15.7* 15.4*  INR 1.4* 1.3* 1.2  CREATININE 11.08* 8.43* 4.88*    Estimated Creatinine Clearance: 32.9 mL/min (A) (by C-G formula based on SCr of 4.88 mg/dL (H)).   Medical History: Past Medical History:  Diagnosis Date  . Anemia   . Anxiety   . Arthritis   . Bipolar 1 disorder ([place])   . Bronchitis    hx of  . Bruises easily   . Chronic kidney disease    decreased left kidney fx  . Chronic pain syndrome 05/11/2012  . Chronic respiratory failure with hypoxia ([facility])    And with hypercapnia  . Diabetes mellitus without complication ([place])   . Diabetic neuropathy ([place])   . DVT (deep venous thrombosis) (H[facility])    LLE DVT ~ '12  . Dyspnea    with ambulation  . GERD (gastroesophageal reflux disease)   . History of 2019 novel coronavirus disease (COVID-19)   . HOH (hard of hearing) 2015   has hearing aids  . Mental disorder   . Migraine    . Neuromuscular disorder ([place])   . Obesity hypoventilation syndrome ([place])   . Obstructive sleep apnea    CPAP  . PE (pulmonary embolism)    bilateral PE ~ '11  . Seizures ([place])   . Thrombocytopenia ([place]) 05/11/2012    Medications:  Scheduled:  . sodium chloride   Intravenous Once  . sodium chloride   Intravenous Once  . calcium acetate  1,334 mg Oral TID WC  . Chlorhexidine Gluconate Cloth  6 each Topical Q0600  . Chlorhexidine Gluconate Cloth  6 each Topical Q0600  . [START ON 06/20/2019] darbepoetin (ARANESP) injection - DIALYSIS  60 mcg Intravenous Q Tue-HD  . fentaNYL  1 patch Transdermal Q3 days  . finasteride  5 mg Oral Daily  . insulin aspart  0-9 Units Subcutaneous TID WC  . insulin glargine  45 Units Subcutaneous BID  . lamoTRIgine  200 mg Oral BID  . levETIRAcetam  1,000 mg Oral BID  . simvastatin  20 mg Oral QHS  . tamsulosin  0.4 mg Oral Daily  . traZODone  50 mg Oral QHS   Infusions:  . sodium chloride 250 mL (06/15/19 1130)  . [START ON 06/19/2019] cefUROXime (ZINACEF)  IV    . ferric gluconate (FERRLECIT/NULECIT) IV Stopped (06/17/19  1204)    Assessment: 53 y/o male with a PMH significant for DVT/PE on warfarin PTA. Warfarin was held due to gross hematuria. Last dose 12/12 at 1700. Today, hematuria has resolved per Dr.Elgergaway. Hgb stable at 8.2, Plt low at 74 but relatively stable.   Pharmacy has been consulted to dose heparin IV for DVT/PE with no bolus with a heparin level goal at the lower end.   Goal of Therapy:  Heparin level 0.3-0.5 Monitor platelets by anticoagulation protocol: Yes   Plan:  - Start heparin at 16 units/kg/hr (~2100 units/hr) - Check anti-Xa level in 6 hours at 1900 with a goal of 0.3-0.5 - Check daily anti-Xa level, CBC - Monitor s/sx of bleeding   Agnes Lawrence, PharmD PGY1 Pharmacy Resident

## 2019-06-18 NOTE — Progress Notes (Signed)
Patient is scheduled for left arm fistula tomorrow.  We will remove his left arm IV.  I discussed the risks and benefits as well as the details of the operation with him including the risk of nonmaturity, the need for future interventions, and the risk of steal syndrome.  All questions were answered.  He will be n.p.o. after midnight.  [name]

## 2019-06-18 NOTE — Progress Notes (Signed)
[facility] NEPHROLOGY PROGRESS NOTE  Assessment/ Plan: Pt is a 52 y.o. yo male with morbid obesity, OSA, pulmonary embolus, seizure, DM, HTN, RA, bipolar, CKD 4/5 follows at [facility], multiple recent hospitalization for CIVID-PNA, urosepsis, AKI admitted with decreased urine output, gross hematuria and worsening renal failure.  Transferred from [facility] to [facility] for HD.  #AKI on CKD stage IV/V: Multiple recent hospitalization and with creatinine level of 13.8.  He is oliguric and uremic.  Status post right IJ TDC placed by IR on 12/16. First HD on 12/17, tolerated 3 treatment with much improvement clinically.  Plan for next HD tomorrow.  He will need outpatient HD set up on discharge Vein mapping was done. VVS was consulted for permanent access.  #Gross hematuria CT scan with hemorrhagic right kidney cyst.  He has Foley catheter.  Seen by urology, on conservative management.  #Anemia due to CKD and blood loss: Iron saturation 16%.  Continue IV iron and Aranesp during HD.  Received PRBC.  #CKD-MBD: Phosphorus level elevated. PTH  86.  Continue phosphorus binders.  Lab expect to improve with HD.  #Acute encephalopathy, due to medication and uremia:  Mental status has improved.  #Morbid obesity  Subjective: Seen and examined at bedside.  Feeling much better.  Mental status improved.  Wanted to go home before Christmas. Objective Vital signs in last 24 hours: Vitals:   06/17/19 1641 06/17/19 2118 06/18/19 0500 06/18/19 0925  BP: (!) 104/52 (!) 115/53 (!) 123/59 124/64  Pulse: 74 75 71 74  Resp: 18 19 16 18   Temp: 98.3 F (36.8 C) 98.5 F (36.9 C) 98.4 F (36.9 C) 98.6 F (37 C)  TempSrc: Oral Oral Oral Oral  SpO2: 94% 96% 95% 96%  Weight:      Height:       Weight change:   Intake/Output Summary (Last 24 hours) at 06/18/2019 1134 Last data filed at 06/18/2019 0900 Gross per 24 hour  Intake 520 ml  Output 3600 ml  Net -3080 ml       Labs: Basic Metabolic  Panel: Recent Labs  Lab 06/16/19 0507 06/17/19 0854 06/18/19 0610  NA 135 135 136  K 4.4 4.1 3.7  CL 98 97* 100  CO2 23 26 30   GLUCOSE 124* 115* 127*  BUN 64* 50* 28*  CREATININE 11.08* 8.43* 4.88*  CALCIUM 7.8* 8.0* 8.5*  PHOS 8.4* 6.3* 4.3   Liver Function Tests: Recent Labs  Lab 06/11/19 1615 06/16/19 0507 06/17/19 0854 06/18/19 0610  AST 19  --   --   --   ALT 10  --   --   --   ALKPHOS 59  --   --   --   BILITOT 0.8  --   --   --   PROT 6.9  --   --   --   ALBUMIN 2.4* 2.0* 1.9* 2.1*   Recent Labs  Lab 06/11/19 1615  LIPASE 25   Recent Labs  Lab 06/11/19 1615  AMMONIA 23   CBC: Recent Labs  Lab 06/11/19 1615 06/14/19 0534 06/15/19 0746 06/16/19 0507 06/17/19 0854 06/18/19 0610  WBC 4.5 3.9* 3.7* 3.5* 3.5* 3.1*  NEUTROABS 2.8  --   --   --   --   --   HGB 6.9* 6.8* 6.7* 7.5* 7.5* 8.2*  HCT 22.5* 20.7* 20.2* 23.6* 23.0* 26.2*  MCV 98.7 95.0 94.4 95.9 95.8 96.7  PLT 90* 71* 69* 69* 68* 74*   Cardiac Enzymes: No results for  input(s): CKTOTAL, CKMB, CKMBINDEX, TROPONINI in the last 168 hours. CBG: Recent Labs  Lab 06/17/19 1320 06/17/19 1611 06/17/19 2119 06/18/19 0714 06/18/19 1126  GLUCAP 84 108* 163* 113* 154*    Iron Studies: No results for input(s): IRON, TIBC, TRANSFERRIN, FERRITIN in the last 72 hours. Studies/Results: No results found.  Medications: Infusions: . sodium chloride 250 mL (06/15/19 1130)  . [START ON 06/19/2019] cefUROXime (ZINACEF)  IV    . ferric gluconate (FERRLECIT/NULECIT) IV Stopped (06/17/19 1204)    Scheduled Medications: . sodium chloride   Intravenous Once  . sodium chloride   Intravenous Once  . calcium acetate  1,334 mg Oral TID WC  . Chlorhexidine Gluconate Cloth  6 each Topical Q0600  . [START ON 06/20/2019] darbepoetin (ARANESP) injection - DIALYSIS  60 mcg Intravenous Q Tue-HD  . fentaNYL  1 patch Transdermal Q3 days  . finasteride  5 mg Oral Daily  . insulin aspart  0-9 Units Subcutaneous TID  WC  . insulin glargine  45 Units Subcutaneous BID  . lamoTRIgine  200 mg Oral BID  . levETIRAcetam  1,000 mg Oral BID  . simvastatin  20 mg Oral QHS  . tamsulosin  0.4 mg Oral Daily    have reviewed scheduled and prn medications.  Physical Exam: General: Morbidly obese male lying in bed, not in distress Heart:RRR, s1s2 nl, no rub Lungs: Distant breath sound, no crackle appreciated Abdomen:soft, Non-tender, non-distended Extremities: Bilateral lower extremity edema, improving Neurology: More alert awake and pleasant. Dialysis Access: Right IJ TDC placed by IR on 12/16.  [name] 06/18/2019,11:34 AM  LOS: 7 days  Pager: ID:[phone]

## 2019-06-18 NOTE — H&P (View-Only) (Signed)
Patient is scheduled for left arm fistula tomorrow.  We will remove his left arm IV.  I discussed the risks and benefits as well as the details of the operation with him including the risk of nonmaturity, the need for future interventions, and the risk of steal syndrome.  All questions were answered.  He will be n.p.o. after midnight.  Annamarie Major

## 2019-06-19 ENCOUNTER — Inpatient Hospital Stay (HOSPITAL_COMMUNITY): Payer: Medicare Other | Admitting: Anesthesiology

## 2019-06-19 ENCOUNTER — Encounter (HOSPITAL_COMMUNITY)
Admission: EM | Disposition: A | Discharge status: Home Health | Payer: Self-pay | Source: Home / Self Care | Attending: Internal Medicine

## 2019-06-19 DIAGNOSIS — N185 Chronic kidney disease, stage 5: Secondary | ICD-10-CM

## 2019-06-19 HISTORY — PX: AV FISTULA PLACEMENT: SHX1204

## 2019-06-19 LAB — CBC
HCT: 27.1 % — ABNORMAL LOW (ref 39.0–52.0)
HCT: 25.4 % — ABNORMAL LOW (ref 39.0–52.0)
Hemoglobin: 8.6 g/dL — ABNORMAL LOW (ref 13.0–17.0)
Hemoglobin: 7.8 g/dL — ABNORMAL LOW (ref 13.0–17.0)
MCH: 30.5 pg (ref 26.0–34.0)
MCH: 30.5 pg (ref 26.0–34.0)
MCHC: 31.7 g/dL (ref 30.0–36.0)
MCHC: 30.7 g/dL (ref 30.0–36.0)
MCV: 96.1 fL (ref 80.0–100.0)
MCV: 99.2 fL (ref 80.0–100.0)
Platelets: 81 10*3/uL — ABNORMAL LOW (ref 150–400)
Platelets: 81 10*3/uL — ABNORMAL LOW (ref 150–400)
RBC: 2.82 MIL/uL — ABNORMAL LOW (ref 4.22–5.81)
RBC: 2.56 MIL/uL — ABNORMAL LOW (ref 4.22–5.81)
RDW: 14.2 % (ref 11.5–15.5)
RDW: 14.5 % (ref 11.5–15.5)
WBC: 3.3 10*3/uL — ABNORMAL LOW (ref 4.0–10.5)
WBC: 2.8 10*3/uL — ABNORMAL LOW (ref 4.0–10.5)
nRBC: 0 % (ref 0.0–0.2)
nRBC: 0 % (ref 0.0–0.2)

## 2019-06-19 LAB — BASIC METABOLIC PANEL
Anion gap: 9 (ref 5–15)
BUN: 41 mg/dL — ABNORMAL HIGH (ref 6–20)
CO2: 28 mmol/L (ref 22–32)
Calcium: 8.8 mg/dL — ABNORMAL LOW (ref 8.9–10.3)
Chloride: 102 mmol/L (ref 98–111)
Creatinine, Ser: 5.73 mg/dL — ABNORMAL HIGH (ref 0.61–1.24)
GFR calc Af Amer: 12 mL/min — ABNORMAL LOW (ref >60)
GFR calc non Af Amer: 10 mL/min — ABNORMAL LOW (ref >60)
Glucose, Bld: 106 mg/dL — ABNORMAL HIGH (ref 70–99)
Potassium: 3.9 mmol/L (ref 3.5–5.1)
Sodium: 139 mmol/L (ref 135–145)

## 2019-06-19 LAB — RENAL FUNCTION PANEL
Albumin: 2.3 g/dL — ABNORMAL LOW (ref 3.5–5.0)
Albumin: 2.5 g/dL — ABNORMAL LOW (ref 3.5–5.0)
Anion gap: 9 (ref 5–15)
Anion gap: 9 (ref 5–15)
BUN: 40 mg/dL — ABNORMAL HIGH (ref 6–20)
BUN: 42 mg/dL — ABNORMAL HIGH (ref 6–20)
CO2: 28 mmol/L (ref 22–32)
CO2: 27 mmol/L (ref 22–32)
Calcium: 8.6 mg/dL — ABNORMAL LOW (ref 8.9–10.3)
Calcium: 8.7 mg/dL — ABNORMAL LOW (ref 8.9–10.3)
Chloride: 101 mmol/L (ref 98–111)
Chloride: 104 mmol/L (ref 98–111)
Creatinine, Ser: 5.72 mg/dL — ABNORMAL HIGH (ref 0.61–1.24)
Creatinine, Ser: 6.21 mg/dL — ABNORMAL HIGH (ref 0.61–1.24)
GFR calc Af Amer: 12 mL/min — ABNORMAL LOW (ref >60)
GFR calc Af Amer: 11 mL/min — ABNORMAL LOW (ref >60)
GFR calc non Af Amer: 10 mL/min — ABNORMAL LOW (ref >60)
GFR calc non Af Amer: 9 mL/min — ABNORMAL LOW (ref >60)
Glucose, Bld: 109 mg/dL — ABNORMAL HIGH (ref 70–99)
Glucose, Bld: 150 mg/dL — ABNORMAL HIGH (ref 70–99)
Phosphorus: 5.1 mg/dL — ABNORMAL HIGH (ref 2.5–4.6)
Phosphorus: 5.5 mg/dL — ABNORMAL HIGH (ref 2.5–4.6)
Potassium: 3.8 mmol/L (ref 3.5–5.1)
Potassium: 4.9 mmol/L (ref 3.5–5.1)
Sodium: 138 mmol/L (ref 135–145)
Sodium: 140 mmol/L (ref 135–145)

## 2019-06-19 LAB — GLUCOSE, CAPILLARY
Glucose-Capillary: 102 mg/dL — ABNORMAL HIGH (ref 70–99)
Glucose-Capillary: 124 mg/dL — ABNORMAL HIGH (ref 70–99)
Glucose-Capillary: 113 mg/dL — ABNORMAL HIGH (ref 70–99)

## 2019-06-19 LAB — PROTIME-INR
INR: 1.3 — ABNORMAL HIGH (ref 0.8–1.2)
Prothrombin Time: 15.6 seconds — ABNORMAL HIGH (ref 11.4–15.2)

## 2019-06-19 LAB — HEPARIN LEVEL (UNFRACTIONATED)
Heparin Unfractionated: 0.21 IU/mL — ABNORMAL LOW (ref 0.30–0.70)
Heparin Unfractionated: <0.1 IU/mL — ABNORMAL LOW (ref 0.30–0.70)

## 2019-06-19 SURGERY — ARTERIOVENOUS (AV) FISTULA CREATION
Anesthesia: Regional | Laterality: Left

## 2019-06-19 MED ORDER — ONDANSETRON HCL 4 MG/2ML IJ SOLN
INTRAMUSCULAR | Status: AC
  Filled 2019-06-19: qty 2

## 2019-06-19 MED ORDER — ACETAMINOPHEN 500 MG PO TABS: 500.0000 mg | ORAL_TABLET | Freq: Once | ORAL | Status: DC

## 2019-06-19 MED ORDER — PROTAMINE SULFATE 10 MG/ML IV SOLN
INTRAVENOUS | Status: AC
  Filled 2019-06-19: qty 5

## 2019-06-19 MED ORDER — ALTEPLASE 2 MG IJ SOLR: 2.0000 mg | Freq: Once | INTRAMUSCULAR | Status: DC | PRN

## 2019-06-19 MED ORDER — LIDOCAINE-PRILOCAINE 2.5-2.5 % EX CREA: 1.0000 "application " | TOPICAL_CREAM | CUTANEOUS | Status: DC | PRN

## 2019-06-19 MED ORDER — DEXAMETHASONE SODIUM PHOSPHATE 10 MG/ML IJ SOLN
INTRAMUSCULAR | Status: DC | PRN
  Administered 2019-06-19: 4 mg via INTRAVENOUS

## 2019-06-19 MED ORDER — PHENYLEPHRINE HCL (PRESSORS) 10 MG/ML IV SOLN
INTRAVENOUS | Status: AC
  Filled 2019-06-19: qty 1

## 2019-06-19 MED ORDER — ALBUMIN HUMAN 5 % IV SOLN
12.5000 g | Freq: Once | INTRAVENOUS | Status: AC
  Administered 2019-06-19: 12.5 g via INTRAVENOUS

## 2019-06-19 MED ORDER — DEXMEDETOMIDINE HCL IN NACL 200 MCG/50ML IV SOLN
INTRAVENOUS | Status: AC
  Filled 2019-06-19: qty 100

## 2019-06-19 MED ORDER — SODIUM CHLORIDE 0.9 % IV SOLN: 100.0000 mL | INTRAVENOUS | Status: DC | PRN

## 2019-06-19 MED ORDER — PHENYLEPHRINE 40 MCG/ML (10ML) SYRINGE FOR IV PUSH (FOR BLOOD PRESSURE SUPPORT)
PREFILLED_SYRINGE | INTRAVENOUS | Status: AC
  Filled 2019-06-19: qty 10

## 2019-06-19 MED ORDER — EPHEDRINE 5 MG/ML INJ
INTRAVENOUS | Status: AC
  Filled 2019-06-19: qty 10

## 2019-06-19 MED ORDER — DEXMEDETOMIDINE HCL IN NACL 400 MCG/100ML IV SOLN
INTRAVENOUS | Status: DC | PRN
  Administered 2019-06-19: .9 ug/kg/h via INTRAVENOUS
  Administered 2019-06-19: .7 ug/kg/h via INTRAVENOUS

## 2019-06-19 MED ORDER — LIDOCAINE-EPINEPHRINE 1 %-1:100000 IJ SOLN
INTRAMUSCULAR | Status: AC
  Filled 2019-06-19: qty 1

## 2019-06-19 MED ORDER — PENTAFLUOROPROP-TETRAFLUOROETH EX AERO: 1.0000 "application " | INHALATION_SPRAY | CUTANEOUS | Status: DC | PRN

## 2019-06-19 MED ORDER — 0.9 % SODIUM CHLORIDE (POUR BTL) OPTIME
TOPICAL | Status: DC | PRN
  Administered 2019-06-19: 1000 mL

## 2019-06-19 MED ORDER — ALBUMIN HUMAN 5 % IV SOLN: 12.5000 g | Freq: Once | INTRAVENOUS | Status: AC

## 2019-06-19 MED ORDER — PHENYLEPHRINE 40 MCG/ML (10ML) SYRINGE FOR IV PUSH (FOR BLOOD PRESSURE SUPPORT)
PREFILLED_SYRINGE | INTRAVENOUS | Status: DC | PRN
  Administered 2019-06-19 (×2): 120 ug via INTRAVENOUS
  Administered 2019-06-19: 80 ug via INTRAVENOUS
  Administered 2019-06-19: 120 ug via INTRAVENOUS

## 2019-06-19 MED ORDER — SODIUM CHLORIDE 0.9 % IV SOLN
INTRAVENOUS | Status: AC
  Filled 2019-06-19: qty 1.2

## 2019-06-19 MED ORDER — FENTANYL CITRATE (PF) 250 MCG/5ML IJ SOLN
INTRAMUSCULAR | Status: DC | PRN
  Administered 2019-06-19 (×3): 25 ug via INTRAVENOUS

## 2019-06-19 MED ORDER — PROTAMINE SULFATE 10 MG/ML IV SOLN
INTRAVENOUS | Status: DC | PRN
  Administered 2019-06-19 (×3): 10 mg via INTRAVENOUS

## 2019-06-19 MED ORDER — LIDOCAINE-EPINEPHRINE 1 %-1:100000 IJ SOLN
INTRAMUSCULAR | Status: DC | PRN
  Administered 2019-06-19: 22 mL

## 2019-06-19 MED ORDER — ONDANSETRON HCL 4 MG/2ML IJ SOLN: 4.0000 mg | Freq: Once | INTRAMUSCULAR | Status: DC | PRN

## 2019-06-19 MED ORDER — HEPARIN SODIUM (PORCINE) 1000 UNIT/ML IJ SOLN
INTRAMUSCULAR | Status: DC | PRN
  Administered 2019-06-19: 15000 [IU] via INTRAVENOUS

## 2019-06-19 MED ORDER — FENTANYL CITRATE (PF) 100 MCG/2ML IJ SOLN: 50.0000 ug | Freq: Once | INTRAMUSCULAR | Status: AC

## 2019-06-19 MED ORDER — FENTANYL CITRATE (PF) 100 MCG/2ML IJ SOLN: 25.0000 ug | INTRAMUSCULAR | Status: DC | PRN

## 2019-06-19 MED ORDER — HEPARIN SODIUM (PORCINE) 1000 UNIT/ML IJ SOLN
INTRAMUSCULAR | Status: AC
  Filled 2019-06-19: qty 1

## 2019-06-19 MED ORDER — LIDOCAINE 2% (20 MG/ML) 5 ML SYRINGE
INTRAMUSCULAR | Status: AC
  Filled 2019-06-19: qty 5

## 2019-06-19 MED ORDER — ALBUMIN HUMAN 5 % IV SOLN
INTRAVENOUS | Status: AC
  Filled 2019-06-19: qty 250

## 2019-06-19 MED ORDER — SODIUM CHLORIDE 0.9 % IV SOLN: INTRAVENOUS | Status: DC | PRN

## 2019-06-19 MED ORDER — PROPOFOL 10 MG/ML IV BOLUS: INTRAVENOUS | Status: DC | PRN

## 2019-06-19 MED ORDER — ONDANSETRON HCL 4 MG/2ML IJ SOLN
INTRAMUSCULAR | Status: DC | PRN
  Administered 2019-06-19: 4 mg via INTRAVENOUS

## 2019-06-19 MED ORDER — FENTANYL CITRATE (PF) 250 MCG/5ML IJ SOLN
INTRAMUSCULAR | Status: AC
  Filled 2019-06-19: qty 5

## 2019-06-19 MED ORDER — HEPARIN SODIUM (PORCINE) 1000 UNIT/ML IJ SOLN
INTRAMUSCULAR | Status: AC
  Administered 2019-06-19: 2800 [IU] via INTRAVENOUS_CENTRAL
  Filled 2019-06-19: qty 3

## 2019-06-19 MED ORDER — ALBUMIN HUMAN 5 % IV SOLN
INTRAVENOUS | Status: AC
  Administered 2019-06-19: 12.5 g via INTRAVENOUS
  Filled 2019-06-19: qty 250

## 2019-06-19 MED ORDER — LIDOCAINE-EPINEPHRINE (PF) 1.5 %-1:200000 IJ SOLN
INTRAMUSCULAR | Status: DC | PRN
  Administered 2019-06-19: 30 mL via PERINEURAL

## 2019-06-19 MED ORDER — LIDOCAINE HCL (PF) 1 % IJ SOLN: 5.0000 mL | INTRAMUSCULAR | Status: DC | PRN

## 2019-06-19 MED ORDER — FENTANYL CITRATE (PF) 100 MCG/2ML IJ SOLN
INTRAMUSCULAR | Status: AC
  Administered 2019-06-19: 50 ug via INTRAVENOUS
  Filled 2019-06-19: qty 2

## 2019-06-19 MED ORDER — HEPARIN SODIUM (PORCINE) 1000 UNIT/ML DIALYSIS: 1000.0000 [IU] | INTRAMUSCULAR | Status: DC | PRN

## 2019-06-19 MED ORDER — SODIUM CHLORIDE 0.9 % IV SOLN: INTRAVENOUS | Status: DC

## 2019-06-19 MED ORDER — EPHEDRINE SULFATE 50 MG/ML IJ SOLN
INTRAMUSCULAR | Status: DC | PRN
  Administered 2019-06-19: 10 mg via INTRAVENOUS
  Administered 2019-06-19: 5 mg via INTRAVENOUS
  Administered 2019-06-19: 70 mg via INTRAVENOUS
  Administered 2019-06-19: 10 mg via INTRAVENOUS

## 2019-06-19 MED ORDER — PHENYLEPHRINE HCL-NACL 10-0.9 MG/250ML-% IV SOLN: INTRAVENOUS | Status: DC | PRN

## 2019-06-19 MED ORDER — SODIUM CHLORIDE 0.9 % IV SOLN
INTRAVENOUS | Status: DC | PRN
  Administered 2019-06-19: 500 mL

## 2019-06-19 MED ORDER — SODIUM CHLORIDE 0.9 % IV SOLN
125.0000 mg | Freq: Every day | INTRAVENOUS | Status: AC
  Administered 2019-06-19 – 2019-06-23 (×5): 125 mg via INTRAVENOUS
  Filled 2019-06-19 (×5): qty 10

## 2019-06-19 MED ORDER — LEVETIRACETAM 500 MG PO TABS
500.0000 mg | ORAL_TABLET | Freq: Two times a day (BID) | ORAL | Status: DC
  Administered 2019-06-19 – 2019-06-28 (×19): 500 mg via ORAL
  Filled 2019-06-19 (×19): qty 1

## 2019-06-19 SURGICAL SUPPLY — 34 items
ARMBAND PINK RESTRICT EXTREMIT (MISCELLANEOUS) ×3 IMPLANT
CANISTER SUCT 3000ML PPV (MISCELLANEOUS) ×2 IMPLANT
CANNULA VESSEL 3MM 2 BLNT TIP (CANNULA) ×2 IMPLANT
CLIP VESOCCLUDE MED 6/CT (CLIP) ×2 IMPLANT
CLIP VESOCCLUDE SM WIDE 6/CT (CLIP) ×3 IMPLANT
COVER PROBE W GEL 5X96 (DRAPES) IMPLANT
COVER WAND RF STERILE (DRAPES) ×1 IMPLANT
DECANTER SPIKE VIAL GLASS SM (MISCELLANEOUS) ×1 IMPLANT
DERMABOND ADVANCED (GAUZE/BANDAGES/DRESSINGS) ×1
DERMABOND ADVANCED .7 DNX12 (GAUZE/BANDAGES/DRESSINGS) ×1 IMPLANT
ELECT REM PT RETURN 9FT ADLT (ELECTROSURGICAL) ×2
ELECTRODE REM PT RTRN 9FT ADLT (ELECTROSURGICAL) ×1 IMPLANT
GAUZE 4X4 16PLY RFD (DISPOSABLE) ×1 IMPLANT
GLOVE BIO SURGEON STRL SZ7.5 (GLOVE) ×2 IMPLANT
GLOVE BIOGEL PI IND STRL 8 (GLOVE) ×1 IMPLANT
GLOVE BIOGEL PI INDICATOR 8 (GLOVE) ×1
GOWN STRL REUS W/ TWL LRG LVL3 (GOWN DISPOSABLE) ×3 IMPLANT
GOWN STRL REUS W/TWL LRG LVL3 (GOWN DISPOSABLE) ×3
KIT BASIN OR (CUSTOM PROCEDURE TRAY) ×2 IMPLANT
KIT TURNOVER KIT B (KITS) ×2 IMPLANT
NDL HYPO 25GX1X1/2 BEV (NEEDLE) IMPLANT
NEEDLE HYPO 25GX1X1/2 BEV (NEEDLE) ×2 IMPLANT
NS IRRIG 1000ML POUR BTL (IV SOLUTION) ×2 IMPLANT
PACK CV ACCESS (CUSTOM PROCEDURE TRAY) ×2 IMPLANT
PAD ARMBOARD 7.5X6 YLW CONV (MISCELLANEOUS) ×4 IMPLANT
PENCIL SMOKE EVACUATOR (MISCELLANEOUS) ×1 IMPLANT
SPONGE SURGIFOAM ABS GEL 100 (HEMOSTASIS) IMPLANT
SUT PROLENE 6 0 BV (SUTURE) ×3 IMPLANT
SUT VIC AB 3-0 SH 27 (SUTURE) ×1
SUT VIC AB 3-0 SH 27X BRD (SUTURE) ×1 IMPLANT
SUT VICRYL 4-0 PS2 18IN ABS (SUTURE) ×2 IMPLANT
TOWEL GREEN STERILE (TOWEL DISPOSABLE) ×2 IMPLANT
UNDERPAD 30X30 (UNDERPADS AND DIAPERS) ×2 IMPLANT
WATER STERILE IRR 1000ML POUR (IV SOLUTION) ×2 IMPLANT

## 2019-06-19 NOTE — Progress Notes (Signed)
PT Cancellation Note  Patient Details Name: [name] MRN: [id number] DOB: Dec 19, 1965   Cancelled Treatment:    Reason Eval/Treat Not Completed: Patient at procedure or test/unavailable - will check back as schedule allows.  [place] Pager [phone]  Office [phone]    [place] 06/19/2019, 9:18 AM

## 2019-06-19 NOTE — Progress Notes (Addendum)
[facility] NEPHROLOGY PROGRESS NOTE  Assessment/ Plan: Pt is a 53 y.o. yo male with morbid obesity, OSA, pulmonary embolus, seizure, DM, HTN, RA, bipolar, CKD 4/5 follows at [place], multiple recent hospitalization for CIVID-PNA, urosepsis, AKI admitted with decreased urine output, gross hematuria and worsening renal failure.  Transferred from [facility] to [facility] for HD.  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  He is oliguric and uremic.  Status post right IJ TDC placed by IR on 12/16. First HD on 12/17, tolerated 3 treatments with much improvement clinically.  Seen by vascular and plan for creation of AV fistula today. HD today after the OR. He will need outpatient HD set up on discharge  #Gross hematuria CT scan with hemorrhagic right kidney cyst.  He has Foley catheter.  Seen by urology, on conservative management.  #Anemia due to CKD and blood loss: Iron saturation 16%.  Continue IV iron and Aranesp during HD.  Received PRBC.  #CKD-MBD: Phosphorus level elevated. PTH  86.  Continue phosphorus binders.  Lab expect to improve with HD.  #Acute encephalopathy, due to medication and uremia:  Mental status has improved.  #Morbid obesity  Subjective: Seen and examined at bedside.  No new event.  Plan for AV fistula surgery today by vascular.  Denies nausea vomiting chest pain shortness of breath. Objective Vital signs in last 24 hours: Vitals:   06/18/19 0925 06/18/19 1623 06/18/19 2101 06/19/19 0459  BP: 124/64 128/65 (!) 126/52 135/64  Pulse: 74 76 73 78  Resp: 18  19 20   Temp: 98.6 F (37 C) 99.1 F (37.3 C) 98.3 F (36.8 C) 97.9 F (36.6 C)  TempSrc: Oral Oral Oral   SpO2: 96% 95% 96% 98%  Weight:   (!) 201.8 kg   Height:       Weight change: -2.7 kg  Intake/Output Summary (Last 24 hours) at 06/19/2019 [id number] Last data filed at 06/19/2019 0804 Gross per 24 hour  Intake 1004.78 ml  Output 1800 ml   Net -795.22 ml       Labs: Basic Metabolic Panel: Recent Labs  Lab 06/17/19 0854 06/18/19 0610 06/19/19 0537  NA 135 136 139  138  K 4.1 3.7 3.9  3.8  CL 97* 100 102  101  CO2 26 30 28  28   GLUCOSE 115* 127* 106*  109*  BUN 50* 28* 41*  40*  CREATININE 8.43* 4.88* 5.73*  5.72*  CALCIUM 8.0* 8.5* 8.8*  8.6*  PHOS 6.3* 4.3 5.1*   Liver Function Tests: Recent Labs  Lab 06/17/19 0854 06/18/19 0610 06/19/19 0537  ALBUMIN 1.9* 2.1* 2.3*   No results for input(s): LIPASE, AMYLASE in the last 168 hours. No results for input(s): AMMONIA in the last 168 hours. CBC: Recent Labs  Lab 06/15/19 0746 06/16/19 0507 06/17/19 0854 06/18/19 0610 06/19/19 0537  WBC 3.7* 3.5* 3.5* 3.1* 3.3*  HGB 6.7* 7.5* 7.5* 8.2* 8.6*  HCT 20.2* 23.6* 23.0* 26.2* 27.1*  MCV 94.4 95.9 95.8 96.7 96.1  PLT 69* 69* 68* 74* 81*   Cardiac Enzymes: No results for input(s): CKTOTAL, CKMB, CKMBINDEX, TROPONINI in the last 168 hours. CBG: Recent Labs  Lab 06/18/19 0714 06/18/19 1126 06/18/19 1620 06/18/19 2102 06/19/19 0649  GLUCAP 113* 154* 152* 144* 102*    Iron Studies: No results for input(s): IRON, TIBC, TRANSFERRIN, FERRITIN in the last 72 hours. Studies/Results: No results found.  Medications: Infusions:  [MAR Hold] sodium  chloride 250 mL (06/15/19 1130)   sodium chloride     [MAR Hold] cefUROXime (ZINACEF)  IV     [MAR Hold] ferric gluconate (FERRLECIT/NULECIT) IV Stopped (06/17/19 1204)    Scheduled Medications:  [MAR Hold] sodium chloride   Intravenous Once   [MAR Hold] sodium chloride   Intravenous Once   [MAR Hold] calcium acetate  1,334 mg Oral TID WC   [MAR Hold] Chlorhexidine Gluconate Cloth  6 each Topical Q0600   [MAR Hold] Chlorhexidine Gluconate Cloth  6 each Topical Q0600   [MAR Hold] darbepoetin (ARANESP) injection - DIALYSIS  60 mcg Intravenous Q Tue-HD   [MAR Hold] fentaNYL  1 patch Transdermal Q3 days   [MAR Hold] finasteride  5 mg Oral Daily    [MAR Hold] insulin aspart  0-9 Units Subcutaneous TID WC   [MAR Hold] insulin glargine  45 Units Subcutaneous BID   [MAR Hold] lamoTRIgine  200 mg Oral BID   [MAR Hold] levETIRAcetam  1,000 mg Oral BID   [MAR Hold] simvastatin  20 mg Oral QHS   [MAR Hold] tamsulosin  0.4 mg Oral Daily   [MAR Hold] traZODone  50 mg Oral QHS    have reviewed scheduled and prn medications.  Physical Exam: General: Morbidly obese male lying on bed comfortable, not in distress Heart:RRR, s1s2 nl, no rub Lungs: Distant breath sound, no crackle appreciated Abdomen:soft, Non-tender, non-distended Extremities: Bilateral lower extremity edema, improving Neurology: More alert awake and pleasant. Dialysis Access: Right IJ TDC placed by IR on 12/16.  Jadarius Commons Reesa Chew Leilynn Pilat 06/19/2019,9:22 AM  LOS: 8 days  Pager: [phone]

## 2019-06-19 NOTE — Anesthesia Procedure Notes (Signed)
Anesthesia Regional Block: Supraclavicular block   Pre-Anesthetic Checklist: ,, timeout performed, Correct Patient, Correct Site, Correct Laterality, Correct Procedure, Correct Position, site marked, Risks and benefits discussed,  Surgical consent,  Pre-op evaluation,  At surgeon's request and post-op pain management  Laterality: Left  Prep: chloraprep       Needles:  Injection technique: Single-shot  Needle Type: Echogenic Stimulator Needle     Needle Length: 10cm  Needle Gauge: 21     Additional Needles:   Procedures:,,,, ultrasound used (permanent image in chart),,,,  Narrative:  Start time: 06/19/2019 10:30 AM End time: 06/19/2019 10:45 AM Injection made incrementally with aspirations every 5 mL.  Performed by: Personally  Anesthesiologist: Murvin Natal, MD  Additional Notes: Functioning IV was confirmed and monitors were applied.  A timeout was performed. Sterile prep, hand hygiene and sterile gloves were used. A 16mm 21ga Pajunk echogenic stimulator needle was used. Negative aspiration and negative test dose prior to incremental administration of local anesthetic. The patient tolerated the procedure well.  Ultrasound guidance: relevent anatomy identified, needle position confirmed, local anesthetic spread visualized around nerve(s), vascular puncture avoided.  Image printed for medical record.

## 2019-06-19 NOTE — Progress Notes (Signed)
PROGRESS NOTE    [name]  [id number] DOB: 06-Apr-1966 DOA: 06/11/2019 PCP: Sharion Balloon, FNP   Interval History:   53 y.o. male has multiple medical problems including morbid obesity, OSA/OHS, h/o pulmonary embolus, seizure disorder, DM, HTN, RA, migraines, thrombocytopenia, Bipolar disorder, CKD stage 4 (baseline Cr 3.2-4.1 followed by Dr. Moshe Cipro), and multiple hospitalizations over te past 2 months including 10/11-10/27 due to covid pneumonia with acute on chronic respiratory failure, 11/11-11/16/20 due to AKI and urosepsis, then most recently 12/6-12/10/20 with dysuria and pyuria accompanied with AKI/CKD.  Since discharge, he has not been feeling well and noted significant decrease in UOP .  He did have gross hematuria prior to admission but had cleared up , patient presents with worsening creatinine, anuria, renal has been consulted, recommendation to initiate hemodialysis.  Significant events: 12/13 admitted to [facility] 12/15 transferred to [facility]. 12/15 1 unit PRBC 12/16 1 unit PRBC transfusion 12/16 TDC insertion by IR 12/17 initiation hemodialysis 12/17 2 units PRBC transfusions 12/21 left brachycephalic AV fistula    Subjective:  Patient reports he had a better night sleep on trazodone.  Assessment & Plan:   Active Problems:   Acute-on-chronic kidney injury ([place])  Acute on chronic kidney disease stage 5, anuric -creatinine 9.42 on presentation, did peak at 14.3, baseline 5.5 on recent discharge,  -He does appear to be anuric, with significant uremia on presentation. -TDC inserted by IR 12/16 -Renal input greatly appreciated, started on hemodialysis 12/17, he is currently getting serial hemodialysis, x3 days, next HD on tomorrow. -Left brachycephalic fistula placed today by vascular surgery  Gross hematuria/hemorrhagic cyst -Urology consult greatly appreciated.  Plan for conservative management -Continue with Foley flushes at 200  cc every 4 hours, hematuria appears to be resolved.  Acute on chronic anemia -Anemia of chronic kidney disease, and acute blood loss anemia in the setting of gross hematuria. -He required 4 units PRBC transfusion so far during hospital stay.  So far hemoglobin has been stable over last couple day with no indication for transfusion, will continue to monitor closely -IV Procrit and iron per renal  Thrombocytopenia  -Chronic recurrent problem, in the setting of splenomegaly, so far no indication for transfusion .  Metabolic and toxic encephalopathy likely 2/2 uremia and sedative, improved -Lethargic but arousable initially, this has significantly improved, currently awake alert and appropriate - He was on multiple medications including narcotics and lyrica.    He is on fentanyl patch as well. -Hold home buspirone, Cymbalta, narcotics, Lyrica -continue home Keppra(dose adjusted to renal function), lamotrigine -Mentation should improve after starting hemodialysis given he has uremia.  Recent urinary tract infection, POA -Finished 7-day of Augmentin prescribed at last discharge on 06/08/19, last day 12/16  Prolonged QTC-505.   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  Seizure history-  -continue Keppra,Lamictal  diabetes mellitus on insulin 04/10/19 Hgba1c7.9. -continue Lantus at reduced dose45 u BID,  -Hold home premeal insulin, monthly emgality,weekly Trulicity,Jardiance. - SSI  # Chronic respiratory failure on home 3L O2 # OSA and OHS --continue suppl O2 --continuous pulse ox -Does not use CPAP at home  Morbid obesity BMI54  Depression bipolar disorder -Hold home buspirone, Cymbalta, Lyrica  Chronic  pain syndrome -Hold home oral  PRN opioids due to increased lethargy and now poor kidney clearance --Pt currently has a Fentanyl patch 50 on, due for exchange on 12/16.  It was resumed     DVT prophylaxis: on warfarin >> Heparin GTT Code Status: Full code, patient reports he would leave decision up to his wife to make, does not wish to be kept alive on life support if he is vegetable.  Family Communication: none at bedside Consults called: nephrology , IR, urology Disposition Plan: remains inpatient     Objective: Vitals:   06/19/19 1345 06/19/19 1400 06/19/19 1415 06/19/19 1430  BP: (!) 80/41 (!) 76/43 (!) 101/35 (!) 89/44  Pulse: 68 70 67 66  Resp: 17 19 15 16   Temp:    97.8 F (36.6 C)  TempSrc:      SpO2: 94% 95% 99% 100%  Weight:      Height:        Intake/Output Summary (Last 24 hours) at 06/19/2019 1542 Last data filed at 06/19/2019 1251 Gross per 24 hour  Intake 1884.78 ml  Output 1820 ml  Net 64.78 ml   Filed Weights   06/17/19 0830 06/17/19 1232 06/18/19 2101  Weight: (!) 204.5 kg (!) 201.8 kg (!) 201.8 kg    Examination:   Awake Alert, Oriented X 3, No new F.N deficits, Normal affect Symmetrical Chest wall movement, Good air movement bilaterally, CTAB RRR,No Gallops,Rubs or new Murmurs, No Parasternal Heave +ve B.Sounds, Abd Soft, No tenderness, No rebound - guarding or rigidity. No Cyanosis, Clubbing, edema significantly subsided        Data Reviewed: I have personally reviewed following labs and imaging studies  CBC: Recent Labs  Lab 06/15/19 0746 06/15/19 2000 06/16/19 0507 06/17/19 0854 06/18/19 0610 06/19/19 0537  WBC 3.7*  --  3.5* 3.5* 3.1* 3.3*  HGB 6.7* 7.8* 7.5* 7.5* 8.2* 8.6*  HCT 20.2* 24.3* 23.6* 23.0* 26.2* 27.1*  MCV 94.4  --  95.9 95.8 96.7 96.1  PLT 69*  --  69* 68* 74* 81*   Basic Metabolic Panel: Recent Labs  Lab 06/13/19 0824 06/14/19 0534 06/15/19 0745 06/16/19 0507 06/17/19 0854 06/18/19 0610  06/19/19 0537  NA 137 136 136 135 135 136 139  138  K 4.4 4.2 4.9 4.4 4.1 3.7 3.9  3.8  CL 100 98 99 98 97* 100 102  101  CO2 25 21* 21* 23 26 30 28  28   GLUCOSE 145* 138* 117* 124* 115* 127* 106*  109*  BUN 74* 76* 87* 64* 50* 28* 41*  40*  CREATININE 11.54* 13.10* 14.31* 11.08* 8.43* 4.88* 5.73*  5.72*  CALCIUM 7.9* 7.8* 8.0* 7.8* 8.0* 8.5* 8.8*  8.6*  MG 2.5* 2.5*  --   --   --   --   --   PHOS 9.1*  --  10.7* 8.4* 6.3* 4.3 5.1*   GFR: Estimated Creatinine Clearance: 28.1 mL/min (A) (by C-G formula based on SCr of 5.72 mg/dL (H)). Liver Function Tests: Recent Labs  Lab 06/15/19 0745 06/16/19 0507 06/17/19 0854 06/18/19 0610 06/19/19 0537  ALBUMIN 2.0* 2.0* 1.9* 2.1* 2.3*   No results for input(s): LIPASE, AMYLASE in the last 168 hours. No results for input(s): AMMONIA in the last 168 hours. Coagulation Profile: Recent Labs  Lab 06/15/19 0825 06/16/19 0507 06/17/19 0854 06/18/19 0610 06/19/19 0537  INR 1.8* 1.4* 1.3* 1.2 1.3*   Cardiac Enzymes: No results for input(s): CKTOTAL, CKMB, CKMBINDEX, TROPONINI in the last 168 hours. BNP (last 3 results) No results  for input(s): PROBNP in the last 8760 hours. HbA1C: No results for input(s): HGBA1C in the last 72 hours. CBG: Recent Labs  Lab 06/18/19 1126 06/18/19 1620 06/18/19 2102 06/19/19 0649 06/19/19 1312  GLUCAP 154* 152* 144* 102* 124*   Lipid Profile: No results for input(s): CHOL, HDL, LDLCALC, TRIG, CHOLHDL, LDLDIRECT in the last 72 hours. Thyroid Function Tests: No results for input(s): TSH, T4TOTAL, FREET4, T3FREE, THYROIDAB in the last 72 hours. Anemia Panel: No results for input(s): VITAMINB12, FOLATE, FERRITIN, TIBC, IRON, RETICCTPCT in the last 72 hours. Sepsis Labs: No results for input(s): PROCALCITON, LATICACIDVEN in the last 168 hours.  Recent Results (from the past 240 hour(s))  SARS CORONAVIRUS 2 (TAT 6-24 HRS) Nasopharyngeal Nasopharyngeal Swab     Status: None   Collection  Time: 06/11/19  6:23 PM   Specimen: Nasopharyngeal Swab  Result Value Ref Range Status   SARS Coronavirus 2 NEGATIVE NEGATIVE Final    Comment: (NOTE) SARS-CoV-2 target nucleic acids are NOT DETECTED. The SARS-CoV-2 RNA is generally detectable in upper and lower respiratory specimens during the acute phase of infection. Negative results do not preclude SARS-CoV-2 infection, do not rule out co-infections with other pathogens, and should not be used as the sole basis for treatment or other patient management decisions. Negative results must be combined with clinical observations, patient history, and epidemiological information. The expected result is Negative. Fact Sheet for Patients: SugarRoll.be Fact Sheet for Healthcare Providers: https://www.woods-mathews.com/ This test is not yet approved or cleared by the [place] FDA and  has been authorized for detection and/or diagnosis of SARS-CoV-2 by FDA under an Emergency Use Authorization (EUA). This EUA will remain  in effect (meaning this test can be used) for the duration of the COVID-19 declaration under Section 56 4(b)(1) of the Act, 21 U.S.C. section 360bbb-3(b)(1), unless the authorization is terminated or revoked sooner. Performed at [facility], [place] [address], [place], [place] [address]   MRSA PCR Screening     Status: None   Collection Time: 06/15/19  4:40 AM   Specimen: Nasal Mucosa; Nasopharyngeal  Result Value Ref Range Status   MRSA by PCR NEGATIVE NEGATIVE Final    Comment:        The GeneXpert MRSA Assay (FDA approved for NASAL specimens only), is one component of a comprehensive MRSA colonization surveillance program. It is not intended to diagnose MRSA infection nor to guide or monitor treatment for MRSA infections. Performed at [facility], [place] [address] [place], [place] [address]       Radiology Studies: No results found.   Scheduled  Meds: . sodium chloride   Intravenous Once  . sodium chloride   Intravenous Once  . calcium acetate  1,334 mg Oral TID WC  . Chlorhexidine Gluconate Cloth  6 each Topical Q0600  . Chlorhexidine Gluconate Cloth  6 each Topical Q0600  . [START ON 06/20/2019] darbepoetin (ARANESP) injection - DIALYSIS  60 mcg Intravenous Q Tue-HD  . fentaNYL  1 patch Transdermal Q3 days  . finasteride  5 mg Oral Daily  . insulin aspart  0-9 Units Subcutaneous TID WC  . insulin glargine  45 Units Subcutaneous BID  . lamoTRIgine  200 mg Oral BID  . levETIRAcetam  500 mg Oral BID  . simvastatin  20 mg Oral QHS  . tamsulosin  0.4 mg Oral Daily  . traZODone  50 mg Oral QHS   Continuous Infusions: . sodium chloride 250 mL (06/15/19 1130)  . sodium  chloride    . sodium chloride    . sodium chloride    . albumin human    . ferric gluconate (FERRLECIT/NULECIT) IV       LOS: 8 days     Phillips Climes, MD [facility] If 7PM-7AM, please contact night-coverage 06/19/2019, 3:42 PM

## 2019-06-19 NOTE — Op Note (Signed)
    NAME: [name]    MRN: [id number] DOB: 1966/04/17    DATE OF OPERATION: 06/19/2019  PREOP DIAGNOSIS:    End-stage renal disease  POSTOP DIAGNOSIS:    Same  PROCEDURE:    Left brachiocephalic AV fistula  SURGEON: Judeth Cornfield. Scot Dock, MD  ASSIST: Risa Grill, PA  ANESTHESIA: Regional block supplemented with local  EBL: Minimal  INDICATIONS:    JAMIAH RAIMER is a 53 y.o. male who presents for new access.  FINDINGS:   The forearm cephalic vein was somewhat tortuous and small in some areas.  I elected to place an upper arm brachiocephalic fistula  TECHNIQUE:   The patient was taken to the operating room and sedated by anesthesia.  The left upper extremity was prepped and draped in the usual sterile fashion.  Transverse incision was made just above the antecubital level after the skin was anesthetized.  Here the cephalic vein was dissected free.  This was a reasonable size vein although it had been cannulated multiple times.  Multiple branches were divided between clips and 3-0 silk ties.  The need to fascia the brachial artery was controlled.  The patient was heparinized.  The brachial artery was clamped proximally and distally and a longitudinal arteriotomy was made.  The vein was sewn into side to the artery using continuous 6-0 Prolene suture.  At the completion was a good thrill in the fistula.  There was a palpable radial pulse.  The heparin was partially reversed with protamine.  The wound was closed with a deep layer of 3-0 Vicryl and the skin closed with 4-0 Vicryl.  Dermabond was applied.  The patient tolerated the procedure well was transferred to the recovery room in stable condition.  All needle and sponge counts were correct.  [name], MD, FACS [facility]  DATE OF DICTATION:   06/19/2019

## 2019-06-19 NOTE — Interval H&P Note (Signed)
History and Physical Interval Note:  06/19/2019 10:16 AM  [name]  has presented today for surgery, with the diagnosis of ESRD.  The various methods of treatment have been discussed with the patient and family. After consideration of risks, benefits and other options for treatment, the patient has consented to  Procedure(s): ARTERIOVENOUS (AV) FISTULA CREATION (Left) as a surgical intervention.  The patient's history has been reviewed, patient examined, no change in status, stable for surgery.  I have reviewed the patient's chart and labs.  Questions were answered to the patient's satisfaction.     [name]

## 2019-06-19 NOTE — Anesthesia Preprocedure Evaluation (Signed)
Anesthesia Evaluation    Reviewed: Allergy & Precautions, Patient's Chart, lab work & pertinent test results  Airway Mallampati: III  TM Distance: >3 FB Neck ROM: Full    Dental  (+) Edentulous Upper, Edentulous Lower   Pulmonary shortness of breath and Long-Term Oxygen Therapy, sleep apnea and Oxygen sleep apnea ,  On home O2 4L home oxygen Obesity hypoventilation syndrome  COVID in 11/20   Pulmonary exam normal breath sounds clear to auscultation       Cardiovascular + DVT  Normal cardiovascular exam Rhythm:Regular Rate:Normal  ECG: SR, rate 74   Neuro/Psych  Headaches, Seizures -, Well Controlled,  PSYCHIATRIC DISORDERS Anxiety Depression Bipolar Disorder  Neuromuscular disease    GI/Hepatic Neg liver ROS, GERD  Medicated and Controlled,  Endo/Other  diabetes, Insulin DependentMorbid obesity (SUPER)  Renal/GU ESRF and DialysisRenal disease     Musculoskeletal negative musculoskeletal ROS (+)   Abdominal (+) + obese,   Peds  Hematology  (+) anemia , HLD Thrombocytopenia    Anesthesia Other Findings ESRD  Reproductive/Obstetrics                             Anesthesia Physical Anesthesia Plan  ASA: IV  Anesthesia Plan: Regional   Post-op Pain Management:    Induction: Intravenous  PONV Risk Score and Plan: 1 and Ondansetron, Dexamethasone and Treatment may vary due to age or medical condition  Airway Management Planned: Simple Face Mask  Additional Equipment:   Intra-op Plan:   Post-operative Plan:   Informed Consent: I have reviewed the patients History and Physical, chart, labs and discussed the procedure including the risks, benefits and alternatives for the proposed anesthesia with the patient or authorized representative who has indicated his/her understanding and acceptance.       Plan Discussed with: CRNA  Anesthesia Plan Comments:         Anesthesia Quick  Evaluation

## 2019-06-19 NOTE — Progress Notes (Signed)
Pt returned from dialysis

## 2019-06-19 NOTE — Progress Notes (Signed)
OT Cancellation Note  Patient Details Name: [name] MRN: [id number] DOB: [birth year]   Cancelled Treatment:    Reason Eval/Treat Not Completed: Patient at procedure or test/unavailable - will check back as schedule allows.  [place] OT office: [phone]

## 2019-06-19 NOTE — Anesthesia Postprocedure Evaluation (Signed)
Anesthesia Post Note  Patient: [name]  Procedure(s) Performed: ARTERIOVENOUS (AV) FISTULA CREATION LEFT ARM (Left )     Patient location during evaluation: PACU Anesthesia Type: Regional Level of consciousness: awake and alert Pain management: pain level controlled Vital Signs Assessment: post-procedure vital signs reviewed and stable Respiratory status: spontaneous breathing, nonlabored ventilation, respiratory function stable and patient connected to nasal cannula oxygen Cardiovascular status: stable and blood pressure returned to baseline Postop Assessment: no apparent nausea or vomiting Anesthetic complications: no    Last Vitals:  Vitals:   06/19/19 1415 06/19/19 1430  BP: (!) 101/35 (!) 89/44  Pulse: 67 66  Resp: 15 16  Temp:  36.6 C  SpO2: 99% 100%    Last Pain:  Vitals:   06/19/19 1430  TempSrc:   PainSc: 0-No pain                 Ghazi Rumpf P Loretha Ure

## 2019-06-19 NOTE — Transfer of Care (Signed)
Immediate Anesthesia Transfer of Care Note  Patient: [name]  Procedure(s) Performed: ARTERIOVENOUS (AV) FISTULA CREATION LEFT ARM (Left )  Patient Location: PACU  Anesthesia Type:MAC  Level of Consciousness: awake, alert  and oriented  Airway & Oxygen Therapy: Patient Spontanous Breathing and Patient connected to face mask oxygen  Post-op Assessment: Report given to RN and Post -op Vital signs reviewed and stable  Post vital signs: Reviewed and stable  Last Vitals:  Vitals Value Taken Time  BP    Temp    Pulse 68 06/19/19 1302  Resp 16 06/19/19 1302  SpO2 99 % 06/19/19 1302  Vitals shown include unvalidated device data.  Last Pain:  Vitals:   06/19/19 0800  TempSrc:   PainSc: Asleep      Patients Stated Pain Goal: 3 (26/71/24 5809)  Complications: No apparent anesthesia complications

## 2019-06-19 NOTE — Progress Notes (Signed)
[place] for heparin  Indication: h/o pulmonary embolus and DVT  Allergies  Allergen Reactions  . Bee Venom Anaphylaxis, Shortness Of Breath and Swelling  . Other Shortness Of Breath    Itching, rash with IVP DYE, iodine, shellfish LATEX  . Shellfish Allergy Nausea And Vomiting and Other (See Comments)    Feels like insides are twisting  . Iodinated Diagnostic Agents     Other reaction(s): RASH  . Iohexol      Code: RASH, Desc: PT WAS ON PREDNISONE FOR GOUT TX. @ TIME OF SCAN AND RECEIVED 50 MG OF BENADRYL IV-ARS 10/08/07   . Iodine Rash  . Latex Rash    Patient Measurements: Height: 6\' 3"  (190.5 cm) Weight: (!) 444 lb 14.2 oz (201.8 kg) IBW/kg (Calculated) : 84.5 Heparin Dosing Weight: 131.1 kg  Vital Signs: Temp: 97.9 F (36.6 C) (12/21 0459) Temp Source: Oral (12/20 2101) BP: 135/64 (12/21 0459) Pulse Rate: 78 (12/21 0459)  Labs: Recent Labs    06/17/19 0854 06/18/19 0610 06/18/19 1940 06/19/19 0537  HGB 7.5* 8.2*  --  8.6*  HCT 23.0* 26.2*  --  27.1*  PLT 68* 74*  --  81*  LABPROT 15.7* 15.4*  --  15.6*  INR 1.3* 1.2  --  1.3*  HEPARINUNFRC  --   --  0.11* 0.21*  CREATININE 8.43* 4.88*  --  5.73*  5.72*    Estimated Creatinine Clearance: 28.1 mL/min (A) (by C-G formula based on SCr of 5.72 mg/dL (H)).  Assessment: 53 y/o male with h/o DVT/PE for heparin  Goal of Therapy:  Heparin level 0.3-0.5 Monitor platelets by anticoagulation protocol: Yes   Plan:  Increase Heparin 2900 units/hr Check heparin level in 8 hours.   Phillis Knack, PharmD, BCPS  06/19/2019

## 2019-06-19 NOTE — Progress Notes (Signed)
Patient refusing CHG bath for procedure.

## 2019-06-19 NOTE — Discharge Instructions (Signed)
° °  [facility] ° °Discharge Instructions ° °AV Fistula or Graft Surgery for Dialysis Access ° °Please refer to the following instructions for your post-procedure care. Your surgeon or physician assistant will discuss any changes with you. ° °Activity ° °You may drive the day following your surgery, if you are comfortable and no longer taking prescription pain medication. Resume full activity as the soreness in your incision resolves. ° °Bathing/Showering ° °You may shower after you go home. Keep your incision dry for 48 hours. Do not soak in a bathtub, hot tub, or swim until the incision heals completely. You may not shower if you have a hemodialysis catheter. ° °Incision Care ° °Clean your incision with mild soap and water after 48 hours. Pat the area dry with a clean towel. You do not need a bandage unless otherwise instructed. Do not apply any ointments or creams to your incision. You may have skin glue on your incision. Do not peel it off. It will come off on its own in about one week. Your arm may swell a bit after surgery. To reduce swelling use pillows to elevate your arm so it is above your heart. Your doctor will tell you if you need to lightly wrap your arm with an ACE bandage. ° °Diet ° °Resume your normal diet. There are not special food restrictions following this procedure. In order to heal from your surgery, it is CRITICAL to get adequate nutrition. Your body requires vitamins, minerals, and protein. Vegetables are the best source of vitamins and minerals. Vegetables also provide the perfect balance of protein. Processed food has little nutritional value, so try to avoid this. ° °Medications ° °Resume taking all of your medications. If your incision is causing pain, you may take over-the counter pain relievers such as acetaminophen (Tylenol). If you were prescribed a stronger pain medication, please be aware these medications can cause nausea and constipation. Prevent  nausea by taking the medication with a snack or meal. Avoid constipation by drinking plenty of fluids and eating foods with high amount of fiber, such as fruits, vegetables, and grains. Do not take Tylenol if you are taking prescription pain medications. ° ° ° ° °Follow up °Your surgeon may want to see you in the office following your access surgery. If so, this will be arranged at the time of your surgery. ° °Please call us immediately for any of the following conditions: ° °Increased pain, redness, drainage (pus) from your incision site °Fever of 101 degrees or higher °Severe or worsening pain at your incision site °Hand pain or numbness. ° °Reduce your risk of vascular disease: ° °Stop smoking. If you would like help, call [facility] at 1-800-QUIT-NOW (1-800-784-8669) or  at 336-586-4000 ° °Manage your cholesterol °Maintain a desired weight °Control your diabetes °Keep your blood pressure down ° °Dialysis ° °It will take several weeks to several months for your new dialysis access to be ready for use. Your surgeon will determine when it is OK to use it. Your nephrologist will continue to direct your dialysis. You can continue to use your Permcath until your new access is ready for use. ° °If you have any questions, please call the office at [phone]. ° °

## 2019-06-20 DIAGNOSIS — N186 End stage renal disease: Secondary | ICD-10-CM

## 2019-06-20 LAB — RENAL FUNCTION PANEL
Albumin: 2.5 g/dL — ABNORMAL LOW (ref 3.5–5.0)
Anion gap: 8 (ref 5–15)
BUN: 30 mg/dL — ABNORMAL HIGH (ref 6–20)
CO2: 29 mmol/L (ref 22–32)
Calcium: 8.6 mg/dL — ABNORMAL LOW (ref 8.9–10.3)
Chloride: 99 mmol/L (ref 98–111)
Creatinine, Ser: 3.84 mg/dL — ABNORMAL HIGH (ref 0.61–1.24)
GFR calc Af Amer: 20 mL/min — ABNORMAL LOW (ref >60)
GFR calc non Af Amer: 17 mL/min — ABNORMAL LOW (ref >60)
Glucose, Bld: 159 mg/dL — ABNORMAL HIGH (ref 70–99)
Phosphorus: 4.3 mg/dL (ref 2.5–4.6)
Potassium: 4 mmol/L (ref 3.5–5.1)
Sodium: 136 mmol/L (ref 135–145)

## 2019-06-20 LAB — GLUCOSE, CAPILLARY
Glucose-Capillary: 141 mg/dL — ABNORMAL HIGH (ref 70–99)
Glucose-Capillary: 135 mg/dL — ABNORMAL HIGH (ref 70–99)
Glucose-Capillary: 159 mg/dL — ABNORMAL HIGH (ref 70–99)
Glucose-Capillary: 142 mg/dL — ABNORMAL HIGH (ref 70–99)

## 2019-06-20 LAB — PROTIME-INR
INR: 1.2 (ref 0.8–1.2)
Prothrombin Time: 15.5 seconds — ABNORMAL HIGH (ref 11.4–15.2)

## 2019-06-20 LAB — CBC
HCT: 23.6 % — ABNORMAL LOW (ref 39.0–52.0)
Hemoglobin: 7.4 g/dL — ABNORMAL LOW (ref 13.0–17.0)
MCH: 31 pg (ref 26.0–34.0)
MCHC: 31.4 g/dL (ref 30.0–36.0)
MCV: 98.7 fL (ref 80.0–100.0)
Platelets: 83 10*3/uL — ABNORMAL LOW (ref 150–400)
RBC: 2.39 MIL/uL — ABNORMAL LOW (ref 4.22–5.81)
RDW: 14.3 % (ref 11.5–15.5)
WBC: 3.1 10*3/uL — ABNORMAL LOW (ref 4.0–10.5)
nRBC: 0 % (ref 0.0–0.2)

## 2019-06-20 LAB — PREPARE RBC (CROSSMATCH)

## 2019-06-20 LAB — HEPARIN LEVEL (UNFRACTIONATED): Heparin Unfractionated: <0.1 IU/mL — ABNORMAL LOW (ref 0.30–0.70)

## 2019-06-20 MED ORDER — SODIUM CHLORIDE 0.9% IV SOLUTION: Freq: Once | INTRAVENOUS | Status: DC

## 2019-06-20 MED ORDER — DULOXETINE HCL 60 MG PO CPEP
60.0000 mg | ORAL_CAPSULE | Freq: Every day | ORAL | Status: DC
  Administered 2019-06-20 – 2019-06-28 (×9): 60 mg via ORAL
  Filled 2019-06-20 (×9): qty 1

## 2019-06-20 MED ORDER — CHLORHEXIDINE GLUCONATE CLOTH 2 % EX PADS
6.0000 | MEDICATED_PAD | Freq: Every day | CUTANEOUS | Status: DC
  Administered 2019-06-20 – 2019-06-26 (×4): 6 via TOPICAL

## 2019-06-20 MED ORDER — DARBEPOETIN ALFA 60 MCG/0.3ML IJ SOSY
60.0000 ug | PREFILLED_SYRINGE | INTRAMUSCULAR | Status: DC
  Administered 2019-06-21: 60 ug via INTRAVENOUS
  Filled 2019-06-20: qty 0.3

## 2019-06-20 MED ORDER — WARFARIN SODIUM 5 MG PO TABS
5.0000 mg | ORAL_TABLET | Freq: Once | ORAL | Status: AC
  Administered 2019-06-20: 5 mg via ORAL
  Filled 2019-06-20: qty 1

## 2019-06-20 MED ORDER — HEPARIN (PORCINE) 25000 UT/250ML-% IV SOLN
2700.0000 [IU]/h | INTRAVENOUS | Status: AC
  Administered 2019-06-20 (×3): 2700 [IU]/h via INTRAVENOUS
  Filled 2019-06-20 (×2): qty 250

## 2019-06-20 MED ORDER — CEFAZOLIN SODIUM-DEXTROSE 2-4 GM/100ML-% IV SOLN
2.0000 g | INTRAVENOUS | Status: AC
  Administered 2019-06-21 (×2): 2 g via INTRAVENOUS
  Filled 2019-06-20: qty 100

## 2019-06-20 MED ORDER — BUSPIRONE HCL 5 MG PO TABS
15.0000 mg | ORAL_TABLET | Freq: Two times a day (BID) | ORAL | Status: DC
  Administered 2019-06-21 – 2019-07-03 (×24): 15 mg via ORAL
  Filled 2019-06-20 (×24): qty 3

## 2019-06-20 MED ORDER — WARFARIN - PHARMACIST DOSING INPATIENT: Freq: Every day | Status: DC

## 2019-06-20 NOTE — Progress Notes (Signed)
PROGRESS NOTE    [name]  [id number] DOB: [birth year] DOA: 06/11/2019 PCP: Patrick Balloon, FNP   Interval History:   53 y.o. male has multiple medical problems including morbid obesity, OSA/OHS, h/o pulmonary embolus, seizure disorder, DM, HTN, RA, migraines, thrombocytopenia, Bipolar disorder, CKD stage 4 (baseline Cr 3.2-4.1 followed by Dr. Moshe [name]), and multiple hospitalizations over te past 2 months including 10/11-10/27 due to covid pneumonia with acute on chronic respiratory failure, 11/11-11/16/20 due to AKI and urosepsis, then most recently 12/6-12/10/20 with dysuria and pyuria accompanied with AKI/CKD.  Since discharge, he has not been feeling well and noted significant decrease in UOP .  He did have gross hematuria prior to admission but had cleared up , patient presents with worsening creatinine, anuria, renal has been consulted, recommendation to initiate hemodialysis.  Significant events: 12/13 admitted to [facility] 12/15 transferred to [facility]. 12/15 1 unit PRBC 12/16 1 unit PRBC transfusion 12/16 TDC insertion by IR 12/17 initiation hemodialysis 12/17 2 units PRBC transfusions 12/21 left brachycephalic AV fistula [id number] 1 unit PRBC transfusion    Subjective:  Patient reports poor night sleep, denies any chest pain or shortness of breath .  I have discussed with the patient, that he needs to stop drinking [facility], is likely causing his insomnia.  Assessment & Plan:   Active Problems:   Acute-on-chronic kidney injury ([place])  Acute on chronic kidney disease stage 5, anuric -creatinine 9.42 on presentation, did peak at 14.3, baseline 5.5 on recent discharge,  -He does appear to be anuric, with significant uremia on presentation. -TDC inserted by IR 12/16, IR consulted, to convert to tunneled dialysis catheter tomorrow -Renal input greatly appreciated, started on hemodialysis 12/17, initially received serial hemodialysis, x3 days,  currently on Monday/Wednesday/Friday scheduled -Left brachycephalic fistula placed [id number] by vascular surgery  Gross hematuria/hemorrhagic cyst -Urology consult greatly appreciated.  Plan for conservative management -Continue with Foley flushes at 200 cc every 4 hours, hematuria appears to be resolved.  Acute on chronic anemia -Anemia of chronic kidney disease, and acute blood loss anemia in the setting of gross hematuria. -On IV Procrit and IV iron per renal -No evidence of significant bleed beside his initial hematuria, so far received 4 units PRBC, his hemoglobin 7.4 today, will give another unit PRBC today.  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  Thrombocytopenia  -Chronic recurrent problem, in the setting of splenomegaly, so far no indication for platelet transfusion .  Metabolic and toxic encephalopathy likely 2/2 uremia and sedative, improved -Lethargic but arousable initially, this has significantly improved, currently awake alert and appropriate - He was on multiple medications including narcotics and lyrica.    He is on fentanyl patch as well. -Hold home buspirone, Cymbalta, narcotics, Lyrica -continue home Keppra(dose adjusted to renal function), lamotrigine -Mentation should improve after starting hemodialysis given he has uremia.  Recent urinary tract infection, POA -Finished 7-day of Augmentin prescribed at last discharge on 06/08/19, last day 12/16  Prolonged QTC-505.   --Monitor electrolytes    Seizure history-  -continue Keppra,Lamictal  diabetes mellitus on insulin 04/10/19 Hgba1c7.9. -continue Lantus at reduced dose45 u BID,  -Hold home premeal insulin, monthly emgality,weekly Trulicity,Jardiance. -  SSI  # Chronic respiratory failure on home 3L O2 #  OSA and OHS --continue suppl O2 --continuous pulse ox -Does not use CPAP at home  Morbid obesity BMI54  Depression bipolar disorder -Hold home buspirone, Cymbalta, Lyrica  Chronic pain syndrome -Hold home oral PRN opioids due to increased lethargy and now poor kidney clearance --Pt currently has a Fentanyl patch 50 on, due for exchange on 12/16.  It was resumed     DVT prophylaxis: on warfarin >> Heparin GTT Code Status: Full code, patient reports he would leave decision up to his wife to make, does not wish to be kept alive on life support if he is vegetable.  Family Communication: none at bedside Consults called: nephrology , IR, urology Disposition Plan: remains inpatient     Objective: Vitals:   06/19/19 2030 06/19/19 2134 06/20/19 0444 06/20/19 0812  BP: (!) 106/48 (!) 114/44 (!) 141/54 (!) 94/50  Pulse: 73 77 76 72  Resp: 15 18 19 20   Temp: 97.9 F (36.6 C) 98.3 F (36.8 C) 97.7 F (36.5 C) 98.3 F (36.8 C)  TempSrc: Oral Oral Oral Oral  SpO2: 98% 93% 96% 100%  Weight:   (!) 196 kg   Height:        Intake/Output Summary (Last 24 hours) at 06/20/2019 1357 Last data filed at 06/20/2019 0444 Gross per 24 hour  Intake 0 ml  Output 3825 ml  Net -3825 ml   Filed Weights   06/18/19 2101 06/19/19 1620 06/20/19 0444  Weight: (!) 201.8 kg (!) 196 kg (!) 196 kg    Examination:   Awake Alert, Oriented X 3, No new F.N deficits, Normal affect Symmetrical Chest wall movement, Good air movement bilaterally, CTAB RRR,No Gallops,Rubs or new Murmurs, No Parasternal Heave +ve B.Sounds, Abd Soft, No tenderness, No rebound - guarding or rigidity. No Cyanosis, Clubbing or edema, No new Rash or bruise          Data Reviewed: I have personally reviewed following labs and imaging studies  CBC: Recent Labs  Lab 06/17/19 0854 06/18/19 0610 06/19/19 0537 06/19/19 1513 06/20/19 0504  WBC 3.5* 3.1* 3.3*  2.8* 3.1*  HGB 7.5* 8.2* 8.6* 7.8* 7.4*  HCT 23.0* 26.2* 27.1* 25.4* 23.6*  MCV 95.8 96.7 96.1 99.2 98.7  PLT 68* 74* 81* 81* 83*   Basic Metabolic Panel: Recent Labs  Lab 06/14/19 0534 06/16/19 0507 06/17/19 0854 06/18/19 0610 06/19/19 0537 06/19/19 1513  NA 136 135 135 136 139  138 140  K 4.2 4.4 4.1 3.7 3.9  3.8 4.9  CL 98 98 97* 100 102  101 104  CO2 21* 23 26 30 28  28 27   GLUCOSE 138* 124* 115* 127* 106*  109* 150*  BUN 76* 64* 50* 28* 41*  40* 42*  CREATININE 13.10* 11.08* 8.43* 4.88* 5.73*  5.72* 6.21*  CALCIUM 7.8* 7.8* 8.0* 8.5* 8.8*  8.6* 8.7*  MG 2.5*  --   --   --   --   --   PHOS  --  8.4* 6.3* 4.3 5.1* 5.5*   GFR: Estimated Creatinine Clearance: 25.4 mL/min (A) (by C-G formula based on SCr of 6.21 mg/dL (H)). Liver Function Tests: Recent Labs  Lab 06/16/19 0507 06/17/19 0854 06/18/19 0610 06/19/19 0537 06/19/19 1513  ALBUMIN 2.0* 1.9* 2.1* 2.3* 2.5*   No results for input(s): LIPASE, AMYLASE in the last 168 hours. No results for input(s): AMMONIA in the last 168 hours. Coagulation Profile: Recent Labs  Lab 06/16/19 0507 06/17/19 0854 06/18/19 0610 06/19/19 0537 06/20/19 0504  INR 1.4*  1.3* 1.2 1.3* 1.2   Cardiac Enzymes: No results for input(s): CKTOTAL, CKMB, CKMBINDEX, TROPONINI in the last 168 hours. BNP (last 3 results) No results for input(s): PROBNP in the last 8760 hours. HbA1C: No results for input(s): HGBA1C in the last 72 hours. CBG: Recent Labs  Lab 06/19/19 0649 06/19/19 1312 06/19/19 2153 06/20/19 0651 06/20/19 1141  GLUCAP 102* 124* 113* 141* 135*   Lipid Profile: No results for input(s): CHOL, HDL, LDLCALC, TRIG, CHOLHDL, LDLDIRECT in the last 72 hours. Thyroid Function Tests: No results for input(s): TSH, T4TOTAL, FREET4, T3FREE, THYROIDAB in the last 72 hours. Anemia Panel: No results for input(s): VITAMINB12, FOLATE, FERRITIN, TIBC, IRON, RETICCTPCT in the last 72 hours. Sepsis Labs: No results for  input(s): PROCALCITON, LATICACIDVEN in the last 168 hours.  Recent Results (from the past 240 hour(s))  SARS CORONAVIRUS 2 (TAT 6-24 HRS) Nasopharyngeal Nasopharyngeal Swab     Status: None   Collection Time: 06/11/19  6:23 PM   Specimen: Nasopharyngeal Swab  Result Value Ref Range Status   SARS Coronavirus 2 NEGATIVE NEGATIVE Final    Comment: (NOTE) SARS-CoV-2 target nucleic acids are NOT DETECTED. The SARS-CoV-2 RNA is generally detectable in upper and lower respiratory specimens during the acute phase of infection. Negative results do not preclude SARS-CoV-2 infection, do not rule out co-infections with other pathogens, and should not be used as the sole basis for treatment or other patient management decisions. Negative results must be combined with clinical observations, patient history, and epidemiological information. The expected result is Negative. Fact Sheet for Patients: SugarRoll.be Fact Sheet for Healthcare Providers: https://www.woods-mathews.com/ This test is not yet approved or cleared by the [place] FDA and  has been authorized for detection and/or diagnosis of SARS-CoV-2 by FDA under an Emergency Use Authorization (EUA). This EUA will remain  in effect (meaning this test can be used) for the duration of the COVID-19 declaration under Section 56 4(b)(1) of the Act, 21 U.S.C. section 360bbb-3(b)(1), unless the authorization is terminated or revoked sooner. Performed at [facility], [place] [address], [place], [place] [address]   MRSA PCR Screening     Status: None   Collection Time: 06/15/19  4:40 AM   Specimen: Nasal Mucosa; Nasopharyngeal  Result Value Ref Range Status   MRSA by PCR NEGATIVE NEGATIVE Final    Comment:        The GeneXpert MRSA Assay (FDA approved for NASAL specimens only), is one component of a comprehensive MRSA colonization surveillance program. It is not intended to diagnose  MRSA infection nor to guide or monitor treatment for MRSA infections. Performed at [facility], [place] [address], [place], [place] [address]       Radiology Studies: No results found.   Scheduled Meds: . sodium chloride   Intravenous Once  . sodium chloride   Intravenous Once  . sodium chloride   Intravenous Once  . calcium acetate  1,334 mg Oral TID WC  . Chlorhexidine Gluconate Cloth  6 each Topical Q0600  . Chlorhexidine Gluconate Cloth  6 each Topical Q0600  . Chlorhexidine Gluconate Cloth  6 each Topical Q0600  . darbepoetin (ARANESP) injection - DIALYSIS  60 mcg Intravenous Q Tue-HD  . fentaNYL  1 patch Transdermal Q3 days  . finasteride  5 mg Oral Daily  . insulin aspart  0-9 Units Subcutaneous TID WC  . insulin glargine  45 Units Subcutaneous BID  . lamoTRIgine  200 mg Oral BID  . levETIRAcetam  500 mg Oral BID  . simvastatin  20 mg Oral QHS  . tamsulosin  0.4 mg Oral Daily  . traZODone  50 mg Oral QHS  . warfarin  5 mg Oral ONCE-1800  . Warfarin - Pharmacist Dosing Inpatient   Does not apply q1800   Continuous Infusions: . sodium chloride 250 mL (06/15/19 1130)  . sodium chloride    . [START ON 06/21/2019]  ceFAZolin (ANCEF) IV    . ferric gluconate (FERRLECIT/NULECIT) IV 125 mg (06/20/19 0918)  . heparin 2,700 Units/hr (06/20/19 1022)     LOS: 9 days     Phillips Climes, MD [facility] If 7PM-7AM, please contact night-coverage 06/20/2019, 1:57 PM

## 2019-06-20 NOTE — Discharge Planning (Signed)
Spoke with patient and wife by telephone. Patient expressed preference [facility]. Referral has been initiated. Patient cannot stand and pivot. Uses power wheelchair and has rack on his car to transport wheelchair. Wife states she will transport. Will discuss with Clinic current mobility status and equipment to accommodate his size. Will provide update when Clinic reports back re: admission clearance.

## 2019-06-20 NOTE — Progress Notes (Signed)
ANTICOAGULATION CONSULT NOTE - Follow-Up Consult  Pharmacy Consult for heparin IV Indication: h/o pulmonary embolus and DVT  Patient Measurements: Height: 6\' 3"  (190.5 cm) Weight: (!) 432 lb 1.6 oz (196 kg) IBW/kg (Calculated) : 84.5 Heparin Dosing Weight: 131.1 kg  Vital Signs: Temp: 98.3 F (36.8 C) (12/22 0812) Temp Source: Oral (12/22 0812) BP: 94/50 (12/22 0812) Pulse Rate: 72 (12/22 0812)  Labs: Recent Labs    06/18/19 0610 06/19/19 0537 06/19/19 1513 06/20/19 0504  HGB 8.2* 8.6* 7.8* 7.4*  HCT 26.2* 27.1* 25.4* 23.6*  PLT 74* 81* 81* 83*  LABPROT 15.4* 15.6*  --  15.5*  INR 1.2 1.3*  --  1.2  HEPARINUNFRC  --  0.21* <0.10* <0.10*  CREATININE 4.88* 5.73*  5.72* 6.21*  --     Estimated Creatinine Clearance: 25.4 mL/min (A) (by C-G formula based on SCr of 6.21 mg/dL (H)).   Medications:  Scheduled:  . sodium chloride   Intravenous Once  . sodium chloride   Intravenous Once  . calcium acetate  1,334 mg Oral TID WC  . Chlorhexidine Gluconate Cloth  6 each Topical Q0600  . Chlorhexidine Gluconate Cloth  6 each Topical Q0600  . darbepoetin (ARANESP) injection - DIALYSIS  60 mcg Intravenous Q Tue-HD  . fentaNYL  1 patch Transdermal Q3 days  . finasteride  5 mg Oral Daily  . insulin aspart  0-9 Units Subcutaneous TID WC  . insulin glargine  45 Units Subcutaneous BID  . lamoTRIgine  200 mg Oral BID  . levETIRAcetam  500 mg Oral BID  . simvastatin  20 mg Oral QHS  . tamsulosin  0.4 mg Oral Daily  . traZODone  50 mg Oral QHS   Infusions:  . sodium chloride 250 mL (06/15/19 1130)  . sodium chloride    . ferric gluconate (FERRLECIT/NULECIT) IV 125 mg (06/19/19 1900)    Assessment: 53 y/o male with a PMH significant for DVT/PE on warfarin PTA. Warfarin was held initially due to gross hematuria. A Heparin bridge was started on 12/20 - then held 12/21 AM for plans for AVF placement. Discussed with [facility] Scot Dock) this AM and got okay to resume Heparin drip. Also  with plans to resume warfarin per Dr. Waldron [facility].  The patient's last heparin level was 0.21 on 12/21 AM at a rate of 2450 units/hr - will resume at an increased rate and follow-up with a 8h HL.   INR 1.2 - PTA dose noted to be 5 mg/day  Goal of Therapy:  Heparin level 0.3-0.5 Monitor platelets by anticoagulation protocol: Yes   Plan:  - Restart Heparin at a rate of 2700 units/hr (27 ml/hr) - Warfarin 5 mg x 1 dose at 1800 today - Daily PT/INR, CBC, HL - Will continue to monitor for any signs/symptoms of bleeding and will follow up with heparin level in 8 hours   Thank you for allowing pharmacy to be a part of this patient's care.  Alycia Rossetti, PharmD, BCPS Clinical Pharmacist Clinical phone for 06/20/2019: [phone] 06/20/2019 8:33 AM   **Pharmacist phone directory can now be found on [email or web address] ([email or web address]).  Listed under [place].

## 2019-06-20 NOTE — Progress Notes (Signed)
   VASCULAR SURGERY ASSESSMENT & PLAN:   POD 1 - LEFT BRACHIOCEPHALIC AV FISTULA: He has an excellent thrill in his fistula.  His incision looks fine.  He has biphasic Doppler signals in the hand.  I have arranged follow-up in 6 weeks to check on the maturation of his fistula.  He may require superficialization of the fistula.  Vascular surgery will be available as needed.  SUBJECTIVE:   No complaints.  PHYSICAL EXAM:   Vitals:   06/19/19 2000 06/19/19 2030 06/19/19 2134 06/20/19 0444  BP: (!) 108/45 (!) 106/48 (!) 114/44 (!) 141/54  Pulse: 72 73 77 76  Resp: (!) 21 15 18 19   Temp:  97.9 F (36.6 C) 98.3 F (36.8 C) 97.7 F (36.5 C)  TempSrc:  Oral Oral Oral  SpO2:  98% 93% 96%  Weight:    (!) 196 kg  Height:       Good thrill in fistula. His incision looks fine. Biphasic radial and ulnar signals with the Doppler.  LABS:   Lab Results  Component Value Date   WBC 3.1 (L) 06/20/2019   HGB 7.4 (L) 06/20/2019   HCT 23.6 (L) 06/20/2019   MCV 98.7 06/20/2019   PLT 83 (L) 06/20/2019   CBG (last 3)  Recent Labs    06/19/19 1312 06/19/19 2153 06/20/19 0651  GLUCAP 124* 113* 141*    PROBLEM LIST:    Active Problems:   Acute-on-chronic kidney injury ([place])   CURRENT MEDS:   . sodium chloride   Intravenous Once  . sodium chloride   Intravenous Once  . calcium acetate  1,334 mg Oral TID WC  . Chlorhexidine Gluconate Cloth  6 each Topical Q0600  . Chlorhexidine Gluconate Cloth  6 each Topical Q0600  . darbepoetin (ARANESP) injection - DIALYSIS  60 mcg Intravenous Q Tue-HD  . fentaNYL  1 patch Transdermal Q3 days  . finasteride  5 mg Oral Daily  . insulin aspart  0-9 Units Subcutaneous TID WC  . insulin glargine  45 Units Subcutaneous BID  . lamoTRIgine  200 mg Oral BID  . levETIRAcetam  500 mg Oral BID  . simvastatin  20 mg Oral QHS  . tamsulosin  0.4 mg Oral Daily  . traZODone  50 mg Oral QHS    [name] Office: [phone] 06/20/2019

## 2019-06-20 NOTE — Evaluation (Addendum)
Occupational Therapy Evaluation Patient Details Name: [name] MRN: [id number] DOB: 1965/06/30 Today's Date: 06/20/2019    History of Present Illness [name] is a 53 y.o. male with medical history of CKD stage IV, bipolar disorder, chronic respiratory failure on 3 L, diabetes mellitus type 2, DVT, OSA/OHS, pulmonary embolus, seizure disorder, thrombocytopenia, hyperlipidemia, chronic pain syndrome presenting with decreased urine output and fall. (Here one month ago for similar complaints. Course complicated due to recent hospitalization for acute on chronic respiratory failure secondary to COVID-19 PNA.   Clinical Impression   Patient admitted for above and limited by problem list below, including generalized weakness, decreased activity tolerance.  He is supine in bed and agreeable to OT/PT evaluation, but limited session as patient declines to attempt EOB but rolls in bed with mod assist +2.  He requires min assist for grooming, mod-max assist for UB ADLs, and max-total assist +2 for LB ADLs from bed level.  He reports multiple recent admissions, increased assist with ADLs, mobility recently, but reports at home before this admission he was able to complete UB ADLs and spouse assists with LB ADLs, able to transfer from bed to power chair with a little help from spouse. Spouse confirms this, as she arrived at end of session, but asking about sliding board to increase ease.  Patient will benefit from continued OT services while admitted and after dc at [facility] level in order to optimize independence and safety with ADLs, mobility to return to PLOF/decrease caregiver burden.     Follow Up Recommendations  Home health OT;Supervision/Assistance - 24 hour(pt declining rehab)    Equipment Recommendations  Hospital bed;Other (comment)(hoyer lift )    Recommendations for Other Services       Precautions / Restrictions Precautions Precautions: Fall Restrictions Weight Bearing  Restrictions: No      Mobility Bed Mobility Overal bed mobility: Needs Assistance Bed Mobility: Rolling Rolling: +2 for physical assistance;Mod assist         General bed mobility comments: unwilling to attempt sitting on side of bed today  Transfers                 General transfer comment: unwilling/unable to attempt    Balance                                           ADL either performed or assessed with clinical judgement   ADL Overall ADL's : Needs assistance/impaired     Grooming: Set up;Bed level   Upper Body Bathing: Moderate assistance;Bed level   Lower Body Bathing: Total assistance;+2 for physical assistance;Bed level   Upper Body Dressing : Maximal assistance;Bed level   Lower Body Dressing: Total assistance;+2 for physical assistance;Bed level     Toilet Transfer Details (indicate cue type and reason): deferred          Functional mobility during ADLs: Moderate assistance;+2 for physical assistance;+2 for safety/equipment(rolling in bed only, limited d/t pt declined EOB ) General ADL Comments: pt limited by weakness, activity tolerance     Vision   Vision Assessment?: No apparent visual deficits     Perception     Praxis      Pertinent Vitals/Pain Pain Assessment: No/denies pain Faces Pain Scale: Hurts a little bit Pain Location: generalized Pain Intervention(s): Monitored during session;Repositioned     Hand Dominance Right   Extremity/Trunk Assessment Upper Extremity  Assessment Upper Extremity Assessment: Defer to OT evaluation RUE Deficits / Details: WFL  LUE Deficits / Details: limited FF at shoulder to 90 PROM, no AROM pt reports due to arthritis; AV fistula placed yesterday    Lower Extremity Assessment Lower Extremity Assessment: Generalized weakness   Cervical / Trunk Assessment Cervical / Trunk Assessment: Other exceptions Cervical / Trunk Exceptions: body habitus   Communication  Communication Communication: No difficulties   Cognition Arousal/Alertness: Awake/alert Behavior During Therapy: WFL for tasks assessed/performed Overall Cognitive Status: Within Functional Limits for tasks assessed                                 General Comments: appears WFL, patient hyperverbose and requires redirection to task/topic, self limiting behaviors and requires increased time for processing/problem sovling    General Comments  spouse present at end of session, reports they are able to manage at home but interested in a sliding board for transfers    Exercises     Shoulder Instructions      Home Living Family/patient expects to be discharged to:: Private residence Living Arrangements: Spouse/significant other Available Help at Discharge: Family;Available 24 hours/day Type of Home: House Home Access: Ramped entrance     Home Layout: One level     Bathroom Shower/Tub: Other (comment)   Bathroom Toilet: Standard Bathroom Accessibility: Yes How Accessible: Accessible via wheelchair Home Equipment: [place] - 2 wheels;Adaptive equipment;Wheelchair - power [employer] Equipment: Financial trader Comments: pt reports hospital bed being delievered today      Prior Functioning/Environment Level of Independence: Needs assistance  Gait / Transfers Assistance Needed: transfers only, wife stating it is harder to accomplish (up hill) from power chair to bed. ADL's / Homemaking Assistance Needed: spouse assist with bath/dressing however pt states he can do it on his own   Comments: unsure accuracy of report, and how much spouse assists        OT Problem List: Decreased strength;Decreased activity tolerance;Impaired balance (sitting and/or standing);Decreased safety awareness;Decreased knowledge of use of DME or AE;Obesity;Impaired UE functional use;Decreased range of motion      OT Treatment/Interventions: Self-care/ADL training;Therapeutic exercise;DME  and/or AE instruction;Therapeutic activities;Balance training;Patient/family education    OT Goals(Current goals can be found in the care plan section) Acute Rehab OT Goals Patient Stated Goal: to return home OT Goal Formulation: With patient/family Time For Goal Achievement: 07/04/19 Potential to Achieve Goals: Fair  OT Frequency: Min 2X/week   Barriers to D/C:            Co-evaluation PT/OT/SLP Co-Evaluation/Treatment: Yes Reason for Co-Treatment: For patient/therapist safety;To address functional/ADL transfers;Complexity of the patient's impairments (multi-system involvement) PT goals addressed during session: Mobility/safety with mobility;Balance OT goals addressed during session: ADL's and self-care      AM-PAC OT "6 Clicks" Daily Activity     Outcome Measure Help from another person eating meals?: None Help from another person taking care of personal grooming?: A Little Help from another person toileting, which includes using toliet, bedpan, or urinal?: Total Help from another person bathing (including washing, rinsing, drying)?: A Lot Help from another person to put on and taking off regular upper body clothing?: A Lot Help from another person to put on and taking off regular lower body clothing?: Total 6 Click Score: 13   End of Session Equipment Utilized During Treatment: Oxygen(4L) Nurse Communication: Mobility status  Activity Tolerance: Patient tolerated treatment well Patient left: in bed;with call bell/phone  within reach;with family/visitor present  OT Visit Diagnosis: Other abnormalities of gait and mobility (R26.89);Muscle weakness (generalized) (M62.81)                Time: [id number] OT Time Calculation (min): 23 min Charges:  OT General Charges $OT Visit: 1 Visit OT Evaluation $OT Eval Moderate Complexity: 1 Mod  Jolaine Artist, OT Acute Rehabilitation Services Pager [phone] Office [phone]   Delight Stare 06/20/2019, 12:28 PM

## 2019-06-20 NOTE — Evaluation (Signed)
Physical Therapy Evaluation Patient Details Name: [name] MRN: [id number] DOB: Jan 29, 1966 Today's Date: 06/20/2019   History of Present Illness  [name] is a 53 y.o. male with medical history of CKD stage IV, bipolar disorder, chronic respiratory failure on 3 L, diabetes mellitus type 2, DVT, OSA/OHS, pulmonary embolus, seizure disorder, thrombocytopenia, hyperlipidemia, chronic pain syndrome presenting with decreased urine output and fall. (Here one month ago for similar complaints. Course complicated due to recent hospitalization for acute on chronic respiratory failure secondary to COVID-19 PNA.  Clinical Impression  Patient received in bed, talkative. Patient agreeable to PT/OT assessment. Requires mod +2 assist for rolling in bed. Unwilling to attempt sitting on side of bed this visit. Reports he is planning to return home with wife and HHPT at discharge. Was able to transfer bed to power chair prior to this admission. Wife is supportive and able to assist with needs. Patient will continue to benefit from skilled PT while here to improve strength and functional mobility to reduce caregiver burden.     Follow Up Recommendations Home health PT;Supervision/Assistance - 24 hour    Equipment Recommendations  Other (comment)(patient asking for sliding board)    Recommendations for Other Services       Precautions / Restrictions Precautions Precautions: Fall Restrictions Weight Bearing Restrictions: No      Mobility  Bed Mobility Overal bed mobility: Needs Assistance Bed Mobility: Rolling Rolling: +2 for physical assistance;Mod assist         General bed mobility comments: unwilling to attempt sitting on side of bed today  Transfers                 General transfer comment: unwilling/unable to attempt  Ambulation/Gait             General Gait Details: not ambulatory at baseline, transfers to power wheelchair  Stairs             Wheelchair Mobility    Modified Rankin (Stroke Patients Only)       Balance                                             Pertinent Vitals/Pain Pain Assessment: No/denies pain Faces Pain Scale: (P) Hurts a little bit Pain Location: (P) generalized Pain Intervention(s): (P) Monitored during session;Repositioned    Home Living Family/patient expects to be discharged to:: Private residence Living Arrangements: Spouse/significant other Available Help at Discharge: Family;Available 24 hours/day Type of Home: House Home Access: Ramped entrance     Home Layout: One level Home Equipment: Walker - 2 wheels;Adaptive equipment;Wheelchair - power Additional Comments: (P) pt reports hospital bed being delievered today    Prior Function Level of Independence: Needs assistance   Gait / Transfers Assistance Needed: transfers only, wife stating it is harder to accomplish (up hill) from power chair to bed.  ADL's / Homemaking Assistance Needed: spouse assist with bath/dressing however pt states he can do it on his own  Comments: (P) unsure accuracy of report, and how much spouse assists     Hand Dominance   Dominant Hand: Right    Extremity/Trunk Assessment   Upper Extremity Assessment Upper Extremity Assessment: Defer to OT evaluation RUE Deficits / Details: (P) WFL  LUE Deficits / Details: (P) limited FF at shoulder to 90 PROM, no AROM pt reports due to arthritis; AV fistula placed yesterday  Lower Extremity Assessment Lower Extremity Assessment: Generalized weakness    Cervical / Trunk Assessment Cervical / Trunk Assessment: (P) Other exceptions Cervical / Trunk Exceptions: (P) body habitus  Communication   Communication: No difficulties  Cognition Arousal/Alertness: Awake/alert Behavior During Therapy: WFL for tasks assessed/performed Overall Cognitive Status: Within Functional Limits for tasks assessed                                  General Comments: (P) appears WFL, patient hyperverbose and requires redirection to task/topic, self limiting behaviors and requires increased time for processing/problem sovling       General Comments General comments (skin integrity, edema, etc.): (P) spouse present at end of session, reports they are able to manage at home but interested in a sliding board for transfers    Exercises     Assessment/Plan    PT Assessment Patient needs continued PT services  PT Problem List Decreased strength;Decreased mobility;Decreased activity tolerance;Decreased balance;Decreased safety awareness;Obesity       PT Treatment Interventions Therapeutic activities;Functional mobility training;Therapeutic exercise;Patient/family education    PT Goals (Current goals can be found in the Care Plan section)  Acute Rehab PT Goals Patient Stated Goal: to return home PT Goal Formulation: With patient/family Time For Goal Achievement: 06/27/19 Potential to Achieve Goals: Good    Frequency Min 2X/week   Barriers to discharge        Co-evaluation PT/OT/SLP Co-Evaluation/Treatment: Yes Reason for Co-Treatment: For patient/therapist safety;To address functional/ADL transfers;Complexity of the patient's impairments (multi-system involvement) PT goals addressed during session: Mobility/safety with mobility;Balance OT goals addressed during session: (P) ADL's and self-care       AM-PAC PT "6 Clicks" Mobility  Outcome Measure Help needed turning from your back to your side while in a flat bed without using bedrails?: A Lot Help needed moving from lying on your back to sitting on the side of a flat bed without using bedrails?: A Lot Help needed moving to and from a bed to a chair (including a wheelchair)?: Total Help needed standing up from a chair using your arms (e.g., wheelchair or bedside chair)?: Total Help needed to walk in hospital room?: Total Help needed climbing 3-5 steps with a railing? :  Total 6 Click Score: 8    End of Session Equipment Utilized During Treatment: Oxygen Activity Tolerance: Patient limited by fatigue Patient left: in bed;with bed alarm set;with call bell/phone within reach;with family/visitor present Nurse Communication: Mobility status PT Visit Diagnosis: Muscle weakness (generalized) (M62.81);History of falling (Z91.81);Other abnormalities of gait and mobility (R26.89)    Time: 1035-1100 PT Time Calculation (min) (ACUTE ONLY): 25 min   Charges:   PT Evaluation $PT Eval Moderate Complexity: 1 Mod PT Treatments $Therapeutic Activity: 8-22 mins        Sarenity Ramaker, PT, GCS 06/20/19,12:29 PM

## 2019-06-20 NOTE — Progress Notes (Addendum)
[name]  Assessment/ Plan: Pt is a 53 y.o. yo male with morbid obesity, OSA, pulmonary embolus, seizure, DM, HTN, RA, bipolar, CKD 4/5 follows at [place], multiple recent hospitalization for CIVID-PNA, urosepsis, AKI admitted with decreased urine output, gross hematuria and worsening renal failure.  Transferred from [facility] to [facility] for HD.  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  He was oliguric and uremic.  Status post right IJ non-tunnel catheter placed by IR on 12/16.Need to convert [facility] before discharge, will re-consult IR.  Status post left brachiocephalic AV fistula creation by Dr. Scot Dock on 12/21. First HD on 12/17, tolerated 3 treatments with significant clinical improvement.  Next HD tomorrow. Will try to do HD when pt sits on chair or recliner. D/w primary team for PT/OT eval.  He will need outpatient HD set up on discharge near [place].  Discussed with SW.  #Gross hematuria CT scan with hemorrhagic right kidney cyst.  He has Foley catheter.  Seen by urology, on conservative management.  #Anemia due to CKD and blood loss: Iron saturation 16%.  Continue IV iron and Aranesp during HD.  Received PRBC.  #CKD-MBD: Phosphorus level elevated. PTH  86.  Continue phosphorus binders.  Lab expect to improve with HD.  #Acute encephalopathy, due to medication and uremia:  Mental status has improved.  #Morbid obesity  Subjective: Seen and examined at bedside.  No new event.  Has been tolerating dialysis well.  Denies nausea vomiting chest pain shortness of breath. Objective Vital signs in last 24 hours: Vitals:   06/19/19 2030 06/19/19 2134 06/20/19 0444 06/20/19 0812  BP: (!) 106/48 (!) 114/44 (!) 141/54 (!) 94/50  Pulse: 73 77 76 72  Resp: 15 18 19 20   Temp: 97.9 F (36.6 C) 98.3 F (36.8 C) 97.7 F (36.5 C) 98.3 F (36.8 C)  TempSrc: Oral Oral Oral Oral  SpO2: 98% 93% 96%  100%  Weight:   (!) 196 kg   Height:       Weight change: -5.8 kg  Intake/Output Summary (Last 24 hours) at 06/20/2019 1137 Last data filed at 06/20/2019 0444 Gross per 24 hour  Intake 900 ml  Output 3845 ml  Net -2945 ml       Labs: Basic Metabolic Panel: Recent Labs  Lab 06/18/19 0610 06/19/19 0537 06/19/19 1513  NA 136 139  138 140  K 3.7 3.9  3.8 4.9  CL 100 102  101 104  CO2 30 28  28 27   GLUCOSE 127* 106*  109* 150*  BUN 28* 41*  40* 42*  CREATININE 4.88* 5.73*  5.72* 6.21*  CALCIUM 8.5* 8.8*  8.6* 8.7*  PHOS 4.3 5.1* 5.5*   Liver Function Tests: Recent Labs  Lab 06/18/19 0610 06/19/19 0537 06/19/19 1513  ALBUMIN 2.1* 2.3* 2.5*   No results for input(s): LIPASE, AMYLASE in the last 168 hours. No results for input(s): AMMONIA in the last 168 hours. CBC: Recent Labs  Lab 06/17/19 0854 06/18/19 0610 06/19/19 0537 06/19/19 1513 06/20/19 0504  WBC 3.5* 3.1* 3.3* 2.8* 3.1*  HGB 7.5* 8.2* 8.6* 7.8* 7.4*  HCT 23.0* 26.2* 27.1* 25.4* 23.6*  MCV 95.8 96.7 96.1 99.2 98.7  PLT 68* 74* 81* 81* 83*   Cardiac Enzymes: No results for input(s): CKTOTAL, CKMB, CKMBINDEX, TROPONINI in the last 168 hours. CBG: Recent Labs  Lab 06/18/19 2102 06/19/19 [id number] 06/19/19 1312 06/19/19 2153 06/20/19 0651  GLUCAP  144* 102* 124* 113* 141*    Iron Studies: No results for input(s): IRON, TIBC, TRANSFERRIN, FERRITIN in the last 72 hours. Studies/Results: No results found.  Medications: Infusions: . sodium chloride 250 mL (06/15/19 1130)  . sodium chloride    . ferric gluconate (FERRLECIT/NULECIT) IV 125 mg (06/20/19 0918)  . heparin 2,700 Units/hr (06/20/19 1022)    Scheduled Medications: . sodium chloride   Intravenous Once  . sodium chloride   Intravenous Once  . calcium acetate  1,334 mg Oral TID WC  . Chlorhexidine Gluconate Cloth  6 each Topical Q0600  . Chlorhexidine Gluconate Cloth  6 each Topical Q0600  . darbepoetin (ARANESP) injection -  DIALYSIS  60 mcg Intravenous Q Tue-HD  . fentaNYL  1 patch Transdermal Q3 days  . finasteride  5 mg Oral Daily  . insulin aspart  0-9 Units Subcutaneous TID WC  . insulin glargine  45 Units Subcutaneous BID  . lamoTRIgine  200 mg Oral BID  . levETIRAcetam  500 mg Oral BID  . simvastatin  20 mg Oral QHS  . tamsulosin  0.4 mg Oral Daily  . traZODone  50 mg Oral QHS  . warfarin  5 mg Oral ONCE-1800  . Warfarin - Pharmacist Dosing Inpatient   Does not apply q1800    have reviewed scheduled and prn medications.  Physical Exam: General: Morbidly obese male lying on bed comfortable, not in distress Heart:RRR, s1s2 nl, no rub Lungs: Distant breath sound, no crackle appreciated Abdomen:soft, Non-tender, non-distended Extremities: Bilateral lower extremity edema, improving Neurology: More alert awake and pleasant. Dialysis Access: Right IJ nontunneled placed by IR on 12/16.  Left AV fistula has good thrill and bruit, wound looks good.  [name] 06/20/2019,11:37 AM  LOS: 9 days  Pager: [phone]

## 2019-06-20 NOTE — Discharge Planning (Signed)
Records reviewed by Medical Director at [facility]. Patient must be able to stand and pivot to get to dialysis seat before he can be accepted. Will keep Dialysis Center updated on patient's progress with Physical Therapy.

## 2019-06-20 NOTE — Consult Note (Signed)
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 bladder. Stomach/Bowel: No evidence of obstruction, inflammatory process, or abnormal fluid collections. Vascular/Lymphatic: No pathologically enlarged lymph nodes identified. No evidence of abdominal aortic aneurysm. Aortic atherosclerosis. Reproductive:  No mass or other significant abnormality. Other:  None. Musculoskeletal:  No suspicious bone lesions identified. IMPRESSION: 1. Increased mild right renal pelvicaliectasis and hazy perinephric opacity. No obstructing calculi identified. These findings are nonspecific, but pyelonephritis cannot be excluded. 2. Stable chronic left renal atrophy. 3. Stable findings of hepatic cirrhosis and portal venous hypertension. No evidence of ascites. 4. Increased patchy areas of airspace disease in both lower lungs, right side greater than left, suspicious for atypical infectious or inflammatory process. Aortic Atherosclerosis (ICD10-I70.0). Electronically Signed   By: Marlaine Hind M.D.   On: 06/04/2019 10:13   VAS [place] UPPER EXT  VEIN MAPPING (PRE-OP AVF)  Result Date: 06/15/2019 UPPER EXTREMITY VEIN MAPPING  Indications:  Pre-access. Limitations: patient body habitus, patient positioning, patient movement Comparison Study: No prior studies. Performing Technologist: Oliver Hum RVT  Examination Guidelines: A complete evaluation includes B-mode imaging, spectral Doppler, color Doppler, and power Doppler as needed of all accessible portions of each vessel. Bilateral testing is considered an integral part of a complete examination. Limited examinations for reoccurring indications may be performed as noted. +-----------------+-------------+----------+---------+ Right Cephalic   Diameter (cm)Depth (cm)Findings  +-----------------+-------------+----------+---------+ Shoulder             0.54        1.41             +-----------------+-------------+----------+---------+ Prox upper arm       0.40        1.47   branching +-----------------+-------------+----------+---------+ Mid upper arm        0.48        0.99             +-----------------+-------------+----------+---------+ Dist upper arm       0.60        0.51             +-----------------+-------------+----------+---------+ Antecubital fossa    0.42        0.41   branching +-----------------+-------------+----------+---------+ Prox forearm         0.21        0.80             +-----------------+-------------+----------+---------+ Mid forearm          0.17        0.75             +-----------------+-------------+----------+---------+ Dist forearm         0.14        0.20             +-----------------+-------------+----------+---------+ +-----------------+-------------+----------+--------------+ Right Basilic    Diameter (cm)Depth (cm)   Findings    +-----------------+-------------+----------+--------------+ Shoulder                                not visualized +-----------------+-------------+----------+--------------+ Prox upper arm                          not visualized  +-----------------+-------------+----------+--------------+ Mid upper arm                           not visualized +-----------------+-------------+----------+--------------+ Dist upper arm                          not visualized +-----------------+-------------+----------+--------------+ Antecubital fossa                       not visualized +-----------------+-------------+----------+--------------+ Prox forearm                            not visualized +-----------------+-------------+----------+--------------+ Mid forearm                             not visualized +-----------------+-------------+----------+--------------+ Distal forearm                          not visualized +-----------------+-------------+----------+--------------+ +-----------------+-------------+----------+---------+ Left Cephalic    Diameter (  cm)Depth (cm)Findings  +-----------------+-------------+----------+---------+ Shoulder             0.68        3.10             +-----------------+-------------+----------+---------+ Prox upper arm       0.44        1.16             +-----------------+-------------+----------+---------+ Mid upper arm        0.41        0.84             +-----------------+-------------+----------+---------+ Dist upper arm       0.57        0.43             +-----------------+-------------+----------+---------+ Antecubital fossa    0.42        0.46   branching +-----------------+-------------+----------+---------+ Prox forearm         0.34        0.72             +-----------------+-------------+----------+---------+ Mid forearm          0.34        0.76   branching +-----------------+-------------+----------+---------+ Dist forearm         0.33        0.37             +-----------------+-------------+----------+---------+ *See table(s) above for measurements and observations.  Diagnosing physician: Servando Snare MD Electronically signed by  Servando Snare MD on 06/15/2019 at 3:37:10 PM.    Final     Labs:  CBC: Recent Labs    06/18/19 0610 06/19/19 0537 06/19/19 1513 06/20/19 0504  WBC 3.1* 3.3* 2.8* 3.1*  HGB 8.2* 8.6* 7.8* 7.4*  HCT 26.2* 27.1* 25.4* 23.6*  PLT 74* 81* 81* 83*    COAGS: Recent Labs    04/22/19 1437 06/17/19 0854 06/18/19 0610 06/19/19 0537 06/20/19 0504  INR 2.6* 1.3* 1.2 1.3* 1.2  APTT 45*  --   --   --   --     BMP: Recent Labs    06/17/19 0854 06/18/19 0610 06/19/19 0537 06/19/19 1513  NA 135 136 139  138 140  K 4.1 3.7 3.9  3.8 4.9  CL 97* 100 102  101 104  CO2 26 30 28  28 27   GLUCOSE 115* 127* 106*  109* 150*  BUN 50* 28* 41*  40* 42*  CALCIUM 8.0* 8.5* 8.8*  8.6* 8.7*  CREATININE 8.43* 4.88* 5.73*  5.72* 6.21*  GFRNONAA 7* 13* 10*  10* 9*  GFRAA 8* 15* 12*  12* 11*    LIVER FUNCTION TESTS: Recent Labs    04/25/19 0500 05/09/19 0959 06/04/19 0919 06/11/19 1615 06/17/19 0854 06/18/19 0610 06/19/19 0537 06/19/19 1513  BILITOT 0.3 0.8 0.7 0.8  --   --   --   --   AST 22 27 22 19   --   --   --   --   ALT 19 14 10 10   --   --   --   --   ALKPHOS 58 65 68 59  --   --   --   --   PROT 6.9 6.8 7.3 6.9  --   --   --   --   ALBUMIN 2.2* 2.4* 2.7* 2.4* 1.9* 2.1* 2.3* 2.5*    TUMOR MARKERS: No results for input(s): AFPTM, CEA, CA199, CHROMGRNA in the last 8760 hours.  Assessment and Plan:  ESRD CKD progression Non tunneled dialysis catheter placed for urgent dialysis 06/14/19 Now needs for ongoing dialysis per Nephrology Planned for tunneled cath 12/23  Left arm fistula placed yesterday in OR  Risks and benefits discussed with the patient including, but not limited to bleeding, infection, vascular injury, pneumothorax which may require chest tube placement, air embolism or even death  All of the patient's questions were answered, patient is agreeable to proceed. Consent signed and in chart.   Thank you for this interesting consult.  I greatly  enjoyed meeting BRANDONN CAPELLI and look forward to participating in their care.  A copy of this report was sent to the requesting provider on this date.  Electronically Signed: Lavonia Drafts, PA-C 06/20/2019, 1:33 PM   I spent a total of 20 Minutes    in face to face in clinical consultation, greater than 50% of which was counseling/coordinating care for tunneled dialysis catheter placement

## 2019-06-21 ENCOUNTER — Inpatient Hospital Stay (HOSPITAL_COMMUNITY): Payer: Medicare Other

## 2019-06-21 DIAGNOSIS — G40909 Epilepsy, unspecified, not intractable, without status epilepticus: Secondary | ICD-10-CM

## 2019-06-21 DIAGNOSIS — G894 Chronic pain syndrome: Secondary | ICD-10-CM

## 2019-06-21 DIAGNOSIS — F339 Major depressive disorder, recurrent, unspecified: Secondary | ICD-10-CM

## 2019-06-21 DIAGNOSIS — E662 Morbid (severe) obesity with alveolar hypoventilation: Secondary | ICD-10-CM

## 2019-06-21 DIAGNOSIS — E1169 Type 2 diabetes mellitus with other specified complication: Secondary | ICD-10-CM

## 2019-06-21 DIAGNOSIS — E785 Hyperlipidemia, unspecified: Secondary | ICD-10-CM

## 2019-06-21 HISTORY — PX: IR US GUIDE VASC ACCESS RIGHT: IMG2390

## 2019-06-21 HISTORY — PX: IR FLUORO GUIDE CV LINE RIGHT: IMG2283

## 2019-06-21 LAB — RENAL FUNCTION PANEL
Albumin: 2.4 g/dL — ABNORMAL LOW (ref 3.5–5.0)
Anion gap: 9 (ref 5–15)
BUN: 43 mg/dL — ABNORMAL HIGH (ref 6–20)
CO2: 30 mmol/L (ref 22–32)
Calcium: 8.7 mg/dL — ABNORMAL LOW (ref 8.9–10.3)
Chloride: 99 mmol/L (ref 98–111)
Creatinine, Ser: 4.4 mg/dL — ABNORMAL HIGH (ref 0.61–1.24)
GFR calc Af Amer: 17 mL/min — ABNORMAL LOW (ref >60)
GFR calc non Af Amer: 14 mL/min — ABNORMAL LOW (ref >60)
Glucose, Bld: 152 mg/dL — ABNORMAL HIGH (ref 70–99)
Phosphorus: 6.3 mg/dL — ABNORMAL HIGH (ref 2.5–4.6)
Potassium: 4.1 mmol/L (ref 3.5–5.1)
Sodium: 138 mmol/L (ref 135–145)

## 2019-06-21 LAB — HEPARIN LEVEL (UNFRACTIONATED)
Heparin Unfractionated: 0.32 IU/mL (ref 0.30–0.70)
Heparin Unfractionated: 0.52 IU/mL (ref 0.30–0.70)

## 2019-06-21 LAB — CBC
HCT: 29.8 % — ABNORMAL LOW (ref 39.0–52.0)
HCT: 26.7 % — ABNORMAL LOW (ref 39.0–52.0)
Hemoglobin: 9.1 g/dL — ABNORMAL LOW (ref 13.0–17.0)
Hemoglobin: 8.2 g/dL — ABNORMAL LOW (ref 13.0–17.0)
MCH: 30.6 pg (ref 26.0–34.0)
MCH: 30.6 pg (ref 26.0–34.0)
MCHC: 30.5 g/dL (ref 30.0–36.0)
MCHC: 30.7 g/dL (ref 30.0–36.0)
MCV: 100.3 fL — ABNORMAL HIGH (ref 80.0–100.0)
MCV: 99.6 fL (ref 80.0–100.0)
Platelets: 80 10*3/uL — ABNORMAL LOW (ref 150–400)
Platelets: 86 10*3/uL — ABNORMAL LOW (ref 150–400)
RBC: 2.97 MIL/uL — ABNORMAL LOW (ref 4.22–5.81)
RBC: 2.68 MIL/uL — ABNORMAL LOW (ref 4.22–5.81)
RDW: 14.7 % (ref 11.5–15.5)
RDW: 14.6 % (ref 11.5–15.5)
WBC: 4.2 10*3/uL (ref 4.0–10.5)
WBC: 3.4 10*3/uL — ABNORMAL LOW (ref 4.0–10.5)
nRBC: 0 % (ref 0.0–0.2)
nRBC: 0 % (ref 0.0–0.2)

## 2019-06-21 LAB — TYPE AND SCREEN
ABO/RH(D): A POS
Antibody Screen: NEGATIVE
Unit division: 0

## 2019-06-21 LAB — BPAM RBC
Blood Product Expiration Date: 202101252359
ISSUE DATE / TIME: 202012221758
Unit Type and Rh: 6200

## 2019-06-21 LAB — PROTIME-INR
INR: 1.3 — ABNORMAL HIGH (ref 0.8–1.2)
Prothrombin Time: 15.6 seconds — ABNORMAL HIGH (ref 11.4–15.2)

## 2019-06-21 LAB — GLUCOSE, CAPILLARY
Glucose-Capillary: 129 mg/dL — ABNORMAL HIGH (ref 70–99)
Glucose-Capillary: 158 mg/dL — ABNORMAL HIGH (ref 70–99)
Glucose-Capillary: 171 mg/dL — ABNORMAL HIGH (ref 70–99)

## 2019-06-21 LAB — HEMOGLOBIN AND HEMATOCRIT, BLOOD
HCT: 26.5 % — ABNORMAL LOW (ref 39.0–52.0)
Hemoglobin: 8.4 g/dL — ABNORMAL LOW (ref 13.0–17.0)

## 2019-06-21 MED ORDER — SODIUM CHLORIDE 0.9 % IV SOLN: 100.0000 mL | INTRAVENOUS | Status: DC | PRN

## 2019-06-21 MED ORDER — WARFARIN SODIUM 5 MG PO TABS
5.0000 mg | ORAL_TABLET | Freq: Once | ORAL | Status: AC
  Administered 2019-06-21: 5 mg via ORAL
  Filled 2019-06-21: qty 1

## 2019-06-21 MED ORDER — LIDOCAINE HCL 1 % IJ SOLN
INTRAMUSCULAR | Status: AC
  Filled 2019-06-21: qty 20

## 2019-06-21 MED ORDER — HEPARIN SODIUM (PORCINE) 1000 UNIT/ML IJ SOLN
INTRAMUSCULAR | Status: AC
  Administered 2019-06-21: 3.8 mL
  Filled 2019-06-21: qty 1

## 2019-06-21 MED ORDER — FENTANYL CITRATE (PF) 100 MCG/2ML IJ SOLN
INTRAMUSCULAR | Status: AC
  Filled 2019-06-21: qty 2

## 2019-06-21 MED ORDER — PREGABALIN 50 MG PO CAPS
50.0000 mg | ORAL_CAPSULE | Freq: Three times a day (TID) | ORAL | Status: DC
  Administered 2019-06-21 – 2019-06-28 (×20): 50 mg via ORAL
  Filled 2019-06-21 (×20): qty 1

## 2019-06-21 MED ORDER — LIDOCAINE HCL (PF) 1 % IJ SOLN: 5.0000 mL | INTRAMUSCULAR | Status: DC | PRN

## 2019-06-21 MED ORDER — ALTEPLASE 2 MG IJ SOLR: 2.0000 mg | Freq: Once | INTRAMUSCULAR | Status: DC | PRN

## 2019-06-21 MED ORDER — HEPARIN SODIUM (PORCINE) 1000 UNIT/ML DIALYSIS: 1000.0000 [IU] | INTRAMUSCULAR | Status: DC | PRN

## 2019-06-21 MED ORDER — CHLORHEXIDINE GLUCONATE 4 % EX LIQD
CUTANEOUS | Status: AC
  Filled 2019-06-21: qty 15

## 2019-06-21 MED ORDER — LIDOCAINE-PRILOCAINE 2.5-2.5 % EX CREA: 1.0000 "application " | TOPICAL_CREAM | CUTANEOUS | Status: DC | PRN

## 2019-06-21 MED ORDER — HEPARIN SODIUM (PORCINE) 1000 UNIT/ML IJ SOLN
INTRAMUSCULAR | Status: AC
  Filled 2019-06-21: qty 3

## 2019-06-21 MED ORDER — LIDOCAINE HCL (PF) 1 % IJ SOLN
INTRAMUSCULAR | Status: AC | PRN
  Administered 2019-06-21: 10 mL

## 2019-06-21 MED ORDER — MIDAZOLAM HCL 2 MG/2ML IJ SOLN
INTRAMUSCULAR | Status: AC
  Filled 2019-06-21: qty 2

## 2019-06-21 MED ORDER — FENTANYL CITRATE (PF) 100 MCG/2ML IJ SOLN
INTRAMUSCULAR | Status: AC | PRN
  Administered 2019-06-21: 50 ug via INTRAVENOUS

## 2019-06-21 MED ORDER — MIDAZOLAM HCL 2 MG/2ML IJ SOLN
INTRAMUSCULAR | Status: AC | PRN
  Administered 2019-06-21: 1 mg via INTRAVENOUS

## 2019-06-21 MED ORDER — SODIUM CHLORIDE 0.9 % IV SOLN
INTRAVENOUS | Status: AC | PRN
  Administered 2019-06-21: 10 mL/h via INTRAVENOUS

## 2019-06-21 MED ORDER — HEPARIN (PORCINE) 25000 UT/250ML-% IV SOLN
3300.0000 [IU]/h | INTRAVENOUS | Status: DC
  Administered 2019-06-21 – 2019-06-25 (×7): 2700 [IU]/h via INTRAVENOUS
  Administered 2019-06-26 (×3): 3300 [IU]/h via INTRAVENOUS
  Filled 2019-06-21 (×14): qty 250

## 2019-06-21 MED ORDER — CEFAZOLIN SODIUM-DEXTROSE 2-4 GM/100ML-% IV SOLN
INTRAVENOUS | Status: AC
  Filled 2019-06-21: qty 100

## 2019-06-21 MED ORDER — PENTAFLUOROPROP-TETRAFLUOROETH EX AERO: 1.0000 "application " | INHALATION_SPRAY | CUTANEOUS | Status: DC | PRN

## 2019-06-21 NOTE — Progress Notes (Signed)
PROGRESS NOTE    TYLIN ZUCCARO  [id number] DOB: [birth year] DOA: 06/11/2019 PCP: Sharion Balloon, FNP    Brief Narrative:  53 yo male who presents with decreased urine output. 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 HE developed urinary retention, had a Foley catheter placed and discharge home from the ED. His urine output continue to decrease despite the catheter and furosemide. On his intial physical examination his blood pressure was 99/84, pulse rate 70, respirate 18, oxygen saturation 97%, he had dry mucous membranes, his lungs were clear to auscultation bilaterally, heart S1-S2 present rhythmic, his abdomen was protuberant, no lower extremity edema. Sodium 136, potassium 4.1, chloride 95, bicarb 25, glucose 128, BUN 65, creatinine 9.42, white count 4.5, hemoglobin 6.9, hematocrit 22.5, platelets 90.   Patient was initially admitted anything hospital, on December 15 transferred to [facility], he has received a total of 5 units of packed red blood cells.   His renal function continued to deteriorate and patient was started on hemodialysis on December 17.  A right internal jugular vein dialysis catheter has been placed and left brachiocephalic AV fistula was formed.  Assessment & Plan:   Principal Problem:   Acute-on-chronic kidney injury [facility]) Active Problems:   Morbid obesity (HCC)   OSA (obstructive sleep apnea)   Obesity hypoventilation syndrome (HCC)   DM2 (diabetes mellitus, type 2) (HCC)   History of pulmonary embolism   Chronic anticoagulation   Seizure disorder (HCC)   Anemia   Chronic pain syndrome   History of DVT (deep vein thrombosis)   Hyperlipidemia associated with type 2 diabetes mellitus (HCC)   Depression   CKD (chronic kidney disease), stage V ([place])  1. AKI on CKD stage V. Patient now has a tunneled HD catheter and has been tolerating HD well. No dyspnea or  chest pain. Positive fistula on left upper extremities. Continue with Phsolo.   2. Gross hematuria with acute blood loss anemia. Hematuria has been resolving, continue to monitor urine output per foley catheter. Continue with conservative management for now. Hgb has been stable.   3. Hx of DVT and pulmonary embolism. Patient with no further bleeding, multiple DVT in the past with high risk for recurrence. Patient has been on heparin drip warfarin has been resumed per pharmacy protocol.   4. Chronic anemia with thrombocytopenia. Hgb has been stable, patient is sp 5 units prbc transfusion. Plt 86 today.   5. Metabolic encephalopathy. Patient is awake and alert no confusion or agitation, will continue pscychotropic medications, and will resume lyrica. Continue neuro checks per unit protocol. Patient is wheelchair, bound. Continue with buspirone, duloxetine, lamotrigine, keppra and trazodone.   6. T2DM/ dyslipidemia. Continue insulin sliding scale for glucose cover and monitoring. Patient is tolerating po well. Continue basal insulin with glargine 45 units bid. Continue with simvastatin.   7. Chronic pain syndrome. Continue with fentanyl patch.   DVT prophylaxis: heparin   Code Status:  full Family Communication: no family at the bedside  Disposition Plan/ discharge barriers:  Pending clinical improvement.    Consultants:   Nephrology   Procedures:   Right internal jugular vein HD catheter  Left upper extremity fistula  Antimicrobials:       Subjective: Patient is feeling better, continue to be very weak and deconditioned, no nausea or vomiting. Currently on HD. No chest pain. Positive pain lower extremities, "neuropathy".   Objective:  Vitals:   06/21/19 0750 06/21/19 0800 06/21/19 0830 06/21/19 0900  BP: (!) 118/58 (!) 109/35 (!) 120/56 (!) 134/57  Pulse: 74 72 74 77  Resp: 14 13 11 13   Temp:      TempSrc:      SpO2:      Weight:      Height:        Intake/Output  Summary (Last 24 hours) at 06/21/2019 1136 Last data filed at 06/21/2019 [id number] Gross per 24 hour  Intake --  Output 1400 ml  Net -1400 ml   Filed Weights   06/18/19 2101 06/19/19 1620 06/20/19 0444  Weight: (!) 201.8 kg (!) 196 kg (!) 196 kg    Examination:   General: Not in pain or dyspnea, deconditioned  Neurology: Awake and alert, non focal  E ENT: mild pallor, no icterus, oral mucosa moist Cardiovascular: No JVD. S1-S2 present, rhythmic, no gallops, rubs, or murmurs. No lower extremity edema. Pulmonary: positive breath sounds bilaterally,, no wheezing, rhonchi or rales. Gastrointestinal. Abdomen with no organomegaly, non tender, no rebound or guarding Skin. No rashes Musculoskeletal: no joint deformities     Data Reviewed: I have personally reviewed following labs and imaging studies  CBC: Recent Labs  Lab 06/19/19 0537 06/19/19 1513 06/20/19 0504 06/20/19 2343 06/21/19 0424 06/21/19 0835  WBC 3.3* 2.8* 3.1*  --  4.2 3.4*  HGB 8.6* 7.8* 7.4* 8.4* 9.1* 8.2*  HCT 27.1* 25.4* 23.6* 26.5* 29.8* 26.7*  MCV 96.1 99.2 98.7  --  100.3* 99.6  PLT 81* 81* 83*  --  80* 86*   Basic Metabolic Panel: Recent Labs  Lab 06/18/19 0610 06/19/19 0537 06/19/19 1513 06/20/19 1413 06/21/19 0835  NA 136 139  138 140 136 138  K 3.7 3.9  3.8 4.9 4.0 4.1  CL 100 102  101 104 99 99  CO2 30 28  28 27 29 30   GLUCOSE 127* 106*  109* 150* 159* 152*  BUN 28* 41*  40* 42* 30* 43*  CREATININE 4.88* 5.73*  5.72* 6.21* 3.84* 4.40*  CALCIUM 8.5* 8.8*  8.6* 8.7* 8.6* 8.7*  PHOS 4.3 5.1* 5.5* 4.3 6.3*   GFR: Estimated Creatinine Clearance: 35.9 mL/min (A) (by C-G formula based on SCr of 4.4 mg/dL (H)). Liver Function Tests: Recent Labs  Lab 06/18/19 0610 06/19/19 0537 06/19/19 1513 06/20/19 1413 06/21/19 0835  ALBUMIN 2.1* 2.3* 2.5* 2.5* 2.4*   No results for input(s): LIPASE, AMYLASE in the last 168 hours. No results for input(s): AMMONIA in the last 168  hours. Coagulation Profile: Recent Labs  Lab 06/17/19 0854 06/18/19 0610 06/19/19 0537 06/20/19 0504 06/21/19 0424  INR 1.3* 1.2 1.3* 1.2 1.3*   Cardiac Enzymes: No results for input(s): CKTOTAL, CKMB, CKMBINDEX, TROPONINI in the last 168 hours. BNP (last 3 results) No results for input(s): PROBNP in the last 8760 hours. HbA1C: No results for input(s): HGBA1C in the last 72 hours. CBG: Recent Labs  Lab 06/19/19 2153 06/20/19 0651 06/20/19 1141 06/20/19 1703 06/20/19 2059  GLUCAP 113* 141* 135* 159* 142*   Lipid Profile: No results for input(s): CHOL, HDL, LDLCALC, TRIG, CHOLHDL, LDLDIRECT in the last 72 hours. Thyroid Function Tests: No results for input(s): TSH, T4TOTAL, FREET4, T3FREE, THYROIDAB in the last 72 hours. Anemia Panel: No results for input(s): VITAMINB12, FOLATE, FERRITIN, TIBC, IRON, RETICCTPCT in the last 72 hours.    Radiology Studies: I have reviewed all of the imaging during this hospital visit personally     Scheduled Meds: .  busPIRone  15 mg Oral BID  . calcium acetate  1,334 mg Oral TID WC  . Chlorhexidine Gluconate Cloth  6 each Topical Q0600  . darbepoetin (ARANESP) injection - DIALYSIS  60 mcg Intravenous Q Wed-HD  . DULoxetine  60 mg Oral Daily  . fentaNYL  1 patch Transdermal Q3 days  . finasteride  5 mg Oral Daily  . insulin aspart  0-9 Units Subcutaneous TID WC  . insulin glargine  45 Units Subcutaneous BID  . lamoTRIgine  200 mg Oral BID  . levETIRAcetam  500 mg Oral BID  . simvastatin  20 mg Oral QHS  . tamsulosin  0.4 mg Oral Daily  . traZODone  50 mg Oral QHS  . Warfarin - Pharmacist Dosing Inpatient   Does not apply q1800   Continuous Infusions: . sodium chloride 250 mL (06/15/19 1130)  . sodium chloride    . sodium chloride    . sodium chloride    . ferric gluconate (FERRLECIT/NULECIT) IV 125 mg (06/20/19 [id number])     LOS: 10 days        Delylah Stanczyk Gerome Apley, MD

## 2019-06-21 NOTE — Procedures (Signed)
  Procedure: R int jug tunneled HD cath   EBL:   minimal Complications:  none immediate  See full dictation in [facility].  Dillard Cannon MD Main # [phone] Pager  [phone]

## 2019-06-21 NOTE — Progress Notes (Signed)
ANTICOAGULATION CONSULT NOTE - Follow-Up Consult  Pharmacy Consult for heparin IV Indication: h/o pulmonary embolus and DVT  Patient Measurements: Height: 6\' 3"  (190.5 cm) Weight: (!) 432 lb 1.6 oz (196 kg) IBW/kg (Calculated) : 84.5 Heparin Dosing Weight: 131.1 kg  Vital Signs: Temp: 98.5 F (36.9 C) (12/23 1213) Temp Source: Rectal (12/23 1335) BP: 105/47 (12/23 1350) Pulse Rate: 81 (12/23 1350)  Labs: Recent Labs    06/19/19 0537 06/19/19 1513 06/20/19 0504 06/20/19 1413 06/20/19 2343 06/21/19 0424 06/21/19 0835  HGB 8.6* 7.8* 7.4*  --  8.4* 9.1* 8.2*  HCT 27.1* 25.4* 23.6*  --  26.5* 29.8* 26.7*  PLT 81* 81* 83*  --   --  80* 86*  LABPROT 15.6*  --  15.5*  --   --  15.6*  --   INR 1.3*  --  1.2  --   --  1.3*  --   HEPARINUNFRC 0.21* <0.10* <0.10*  --  0.32 0.52  --   CREATININE 5.73*  5.72* 6.21*  --  3.84*  --   --  4.40*    Estimated Creatinine Clearance: 35.9 mL/min (A) (by C-G formula based on SCr of 4.4 mg/dL (H)).   Medications:  Scheduled:  . busPIRone  15 mg Oral BID  . calcium acetate  1,334 mg Oral TID WC  . chlorhexidine      . chlorhexidine      . Chlorhexidine Gluconate Cloth  6 each Topical Q0600  . darbepoetin (ARANESP) injection - DIALYSIS  60 mcg Intravenous Q Wed-HD  . DULoxetine  60 mg Oral Daily  . fentaNYL  1 patch Transdermal Q3 days  . fentaNYL      . finasteride  5 mg Oral Daily  . heparin      . insulin aspart  0-9 Units Subcutaneous TID WC  . insulin glargine  45 Units Subcutaneous BID  . lamoTRIgine  200 mg Oral BID  . levETIRAcetam  500 mg Oral BID  . lidocaine      . midazolam      . pregabalin  50 mg Oral TID  . simvastatin  20 mg Oral QHS  . tamsulosin  0.4 mg Oral Daily  . traZODone  50 mg Oral QHS  . Warfarin - Pharmacist Dosing Inpatient   Does not apply q1800   Infusions:  . sodium chloride 250 mL (06/15/19 1130)  . sodium chloride    . ceFAZolin    . ferric gluconate (FERRLECIT/NULECIT) IV 125 mg (06/20/19  [id number])    Assessment: 53 y/o male with a PMH significant for DVT/PE on warfarin PTA. Warfarin was held initially due to gross hematuria. A Heparin bridge was started on 12/20 - then held 12/21 AM for plans for AVF placement, held again 12/23 AM for conversion to a tunneled HD cath. The plan is now to resume Heparin 6 hours post-op.   The IR procedure was around 1400 today. Will plan to resume Heparin at 2000 and follow-up with a level with AM labs.   INR today remains SUBtherapeutic (INR 1.3 << 1.2, goal of 2-3). Hgb/Hct low but stable, plts up to 86 - will watch closely.  Goal of Therapy:  Heparin level 0.3-0.5 Monitor platelets by anticoagulation protocol: Yes   Plan:  - Restart Heparin at a rate of 2700 units/hr (27 ml/hr) starting at 2000 this evening  - Repeat Warfarin 5 mg x 1 dose at 1800 today - Daily PT/INR, CBC, HL - Will continue to monitor for  any signs/symptoms of bleeding and will follow up with heparin level in 8 hours after restarting  Thank you for allowing pharmacy to be a part of this patient's care.  Alycia Rossetti, PharmD, BCPS Clinical Pharmacist Clinical phone for 06/21/2019: [phone] 06/21/2019 3:14 PM   **Pharmacist phone directory can now be found on [email or web address] ([email or web address]).  Listed under [place].

## 2019-06-21 NOTE — Procedures (Signed)
Patient was seen on dialysis and the procedure was supervised.  BFR 400  Via TDC BP is  134/57.   Patient appears to be tolerating treatment well  Louis Meckel 06/21/2019

## 2019-06-21 NOTE — Progress Notes (Signed)
ANTICOAGULATION CONSULT NOTE Pharmacy Consult for heparin Indication: h/o pulmonary embolus and DVT  Patient Measurements: Height: 6\' 3"  (190.5 cm) Weight: (!) 432 lb 1.6 oz (196 kg) IBW/kg (Calculated) : 84.5 Heparin Dosing Weight: 131.1 kg  Vital Signs: Temp: 98.5 F (36.9 C) (12/22 1816) Temp Source: Oral (12/22 1741) BP: 130/60 (12/22 1816) Pulse Rate: 71 (12/22 1816)  Labs: Recent Labs    06/18/19 0610 06/19/19 0537 06/19/19 1513 06/20/19 0504 06/20/19 1413 06/20/19 2343  HGB 8.2* 8.6* 7.8* 7.4*  --  8.4*  HCT 26.2* 27.1* 25.4* 23.6*  --  26.5*  PLT 74* 81* 81* 83*  --   --   LABPROT 15.4* 15.6*  --  15.5*  --   --   INR 1.2 1.3*  --  1.2  --   --   HEPARINUNFRC  --  0.21* <0.10* <0.10*  --  0.32  CREATININE 4.88* 5.73*  5.72* 6.21*  --  3.84*  --     Estimated Creatinine Clearance: 41.1 mL/min (A) (by C-G formula based on SCr of 3.84 mg/dL (H)).  Assessment: 53 y.o. male with h/o PE/DVT for heparin  Goal of Therapy:  Heparin level 0.3-0.5 Monitor platelets by anticoagulation protocol: Yes   Plan:  Continue Heparin at current rate  Follow-up am labs.   Phillis Knack, PharmD, BCPS

## 2019-06-21 NOTE — Progress Notes (Signed)
[name] NEPHROLOGY PROGRESS NOTE  Assessment/ Plan: Pt is a 53 y.o. yo male with morbid obesity, OSA, pulmonary embolus, seizure, DM, HTN, RA, bipolar, CKD 4/5 follows at [place], multiple recent hospitalization for CIVID-PNA, urosepsis, AKI admitted with decreased urine output, gross hematuria and worsening renal failure.  Transferred [facility] to [facility] for HD.  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  He was oliguric and uremic.  Status post right IJ non-tunnel catheter placed by IR on 12/16.Need to convert [facility] before discharge, will re-consult IR- planning for today.  Status post left brachiocephalic AV fistula creation by Dr. Scot Dock on 12/21. First HD on 12/17, tolerated 3 treatments with significant clinical improvement.  4th tx today. Needs to have enough mobility to get into HD chair to dialyze OP- discussed with him.  He will need outpatient HD set up on discharge near [place].  Next HD will likely be on Saturday (holiday sched)   #Gross hematuria CT scan with hemorrhagic right kidney cyst.  He has Foley catheter.  Seen by urology,  conservative management.  #Anemia due to CKD and blood loss: Iron saturation 16%.  Continue IV iron and Aranesp during HD- inc dose.  Received PRBC.  #CKD-MBD: Phosphorus level elevated. PTH  86.  Continue phosphorus binders (phoslo) .  Lab expect to improve with HD.  #Acute encephalopathy, due to medication and uremia:  Mental status has improved.  #Morbid obesity  Subjective: Seen and examined on dialysis.  No new event.  Has been tolerating dialysis well.  Agrees that he needs to get stronger before going home   Objective Vital signs in last 24 hours: Vitals:   06/21/19 0750 06/21/19 0800 06/21/19 0830 06/21/19 0900  BP: (!) 118/58 (!) 109/35 (!) 120/56 (!) 134/57  Pulse: 74 72 74 77  Resp: 14 13 11 13   Temp:      TempSrc:      SpO2:      Weight:       Height:       Weight change:   Intake/Output Summary (Last 24 hours) at 06/21/2019 1008 Last data filed at 06/21/2019 0527 Gross per 24 hour  Intake -  Output 1400 ml  Net -1400 ml       Labs: Basic Metabolic Panel: Recent Labs  Lab 06/19/19 1513 06/20/19 1413 06/21/19 0835  NA 140 136 138  K 4.9 4.0 4.1  CL 104 99 99  CO2 27 29 30   GLUCOSE 150* 159* 152*  BUN 42* 30* 43*  CREATININE 6.21* 3.84* 4.40*  CALCIUM 8.7* 8.6* 8.7*  PHOS 5.5* 4.3 6.3*   Liver Function Tests: Recent Labs  Lab 06/19/19 1513 06/20/19 1413 06/21/19 0835  ALBUMIN 2.5* 2.5* 2.4*   No results for input(s): LIPASE, AMYLASE in the last 168 hours. No results for input(s): AMMONIA in the last 168 hours. CBC: Recent Labs  Lab 06/19/19 0537 06/19/19 1513 06/20/19 0504 06/20/19 2343 06/21/19 0424 06/21/19 0835  WBC 3.3* 2.8* 3.1*  --  4.2 3.4*  HGB 8.6* 7.8* 7.4* 8.4* 9.1* 8.2*  HCT 27.1* 25.4* 23.6* 26.5* 29.8* 26.7*  MCV 96.1 99.2 98.7  --  100.3* 99.6  PLT 81* 81* 83*  --  80* 86*   Cardiac Enzymes: No results for input(s): CKTOTAL, CKMB, CKMBINDEX, TROPONINI in the last 168 hours. CBG: Recent Labs  Lab 06/19/19 2153 06/20/19 0651 06/20/19 1141 06/20/19 1703 06/20/19 2059  GLUCAP 113* 141* 135*  159* 142*    Iron Studies: No results for input(s): IRON, TIBC, TRANSFERRIN, FERRITIN in the last 72 hours. Studies/Results: No results found.  Medications: Infusions: . sodium chloride 250 mL (06/15/19 1130)  . sodium chloride    . sodium chloride    . sodium chloride    . ferric gluconate (FERRLECIT/NULECIT) IV 125 mg (06/20/19 [id number])    Scheduled Medications: . busPIRone  15 mg Oral BID  . calcium acetate  1,334 mg Oral TID WC  . Chlorhexidine Gluconate Cloth  6 each Topical Q0600  . darbepoetin (ARANESP) injection - DIALYSIS  60 mcg Intravenous Q Wed-HD  . DULoxetine  60 mg Oral Daily  . fentaNYL  1 patch Transdermal Q3 days  . finasteride  5 mg Oral Daily  .  insulin aspart  0-9 Units Subcutaneous TID WC  . insulin glargine  45 Units Subcutaneous BID  . lamoTRIgine  200 mg Oral BID  . levETIRAcetam  500 mg Oral BID  . simvastatin  20 mg Oral QHS  . tamsulosin  0.4 mg Oral Daily  . traZODone  50 mg Oral QHS  . Warfarin - Pharmacist Dosing Inpatient   Does not apply q1800    have reviewed scheduled and prn medications.  Physical Exam: General: Morbidly obese male lying on bed comfortable, not in distress Heart:RRR, s1s2 nl, no rub Lungs: Distant breath sound, no crackle appreciated Abdomen:soft, Non-tender, non-distended Extremities: Bilateral lower extremity edema, improving Neurology: More alert awake and pleasant. Dialysis Access: Right IJ nontunneled placed by IR on 12/16.  Left AV fistula placed 12/21 has good thrill and bruit, wound looks good.  Sahith Nurse A Yahayra Geis 06/21/2019,10:08 AM  LOS: 10 days

## 2019-06-21 NOTE — TOC Progression Note (Signed)
Transition of Care [facility]) - Progression Note    Patient Details  Name: [name] MRN: [id number] Date of Birth: 1966/01/24  Transition of Care [facility]) CM/SW Contact  Bartholomew Crews, RN Phone Number: [phone] 06/21/2019, 5:27 PM  Clinical Narrative:    Spoke with patient and spouse at bedside. PTA patient was dependent on power chair for mobility. Stated that he has not been able to stand and pivot for transfer since early November when he was working with PT at home. Patient is able to slide himself from bed to chair or chair to chair. However, wife is requesting a slide board for assistance, but was advised that this was not possible.   Discussed with patient and spouse the need to sit up in chair for dialysis. They stated that outpatient dialysis center needed patient to be able to stand and pivot, and the patient sliding himself from chair to chair was not enough.   Discussed ongoing goals for mobility and working on standing at bedside. Patient is agreeable.   Wife is working with [employer] for bariatric bed at home stating that the bariatric beds have been on back order, but are finally available. Patient has been using medical transportation for medical appointments, but wife is also working on getting a wheelchair accessible [employer].   TOC team following for transitions.    Expected Discharge Plan: (Pending PT/OT evals) Barriers to Discharge: Continued Medical Work up  Expected Discharge Plan and Services Expected Discharge Plan: (Pending PT/OT evals)   Discharge Planning Services: CM Consult Post Acute Care Choice: Home Health, Resumption of Svcs/PTA Provider Living arrangements for the past 2 months: [place]: [place]         Social Determinants of Health (SDOH) Interventions    Readmission Risk Interventions Readmission Risk Prevention Plan 06/12/2019 05/10/2019  Transportation Screening  Complete Complete  Medication Review Press photographer) Complete Complete  PCP or Specialist appointment within 3-5 days of discharge Complete -  [place] or Home Care Consult Complete -  SW Recovery Care/Counseling Consult Complete -  Palliative Care Screening Not Applicable Not [place] Not Applicable Not Applicable  Some recent data might be hidden

## 2019-06-22 DIAGNOSIS — Z794 Long term (current) use of insulin: Secondary | ICD-10-CM

## 2019-06-22 DIAGNOSIS — Z86711 Personal history of pulmonary embolism: Secondary | ICD-10-CM

## 2019-06-22 DIAGNOSIS — Z86718 Personal history of other venous thrombosis and embolism: Secondary | ICD-10-CM

## 2019-06-22 DIAGNOSIS — Z7901 Long term (current) use of anticoagulants: Secondary | ICD-10-CM

## 2019-06-22 DIAGNOSIS — G4733 Obstructive sleep apnea (adult) (pediatric): Secondary | ICD-10-CM

## 2019-06-22 DIAGNOSIS — E1165 Type 2 diabetes mellitus with hyperglycemia: Secondary | ICD-10-CM

## 2019-06-22 LAB — CBC
HCT: 27.3 % — ABNORMAL LOW (ref 39.0–52.0)
Hemoglobin: 8.5 g/dL — ABNORMAL LOW (ref 13.0–17.0)
MCH: 30.6 pg (ref 26.0–34.0)
MCHC: 31.1 g/dL (ref 30.0–36.0)
MCV: 98.2 fL (ref 80.0–100.0)
Platelets: 85 10*3/uL — ABNORMAL LOW (ref 150–400)
RBC: 2.78 MIL/uL — ABNORMAL LOW (ref 4.22–5.81)
RDW: 14.4 % (ref 11.5–15.5)
WBC: 4.2 10*3/uL (ref 4.0–10.5)
nRBC: 0 % (ref 0.0–0.2)

## 2019-06-22 LAB — HEPARIN LEVEL (UNFRACTIONATED)
Heparin Unfractionated: 0.17 IU/mL — ABNORMAL LOW (ref 0.30–0.70)
Heparin Unfractionated: 0.26 IU/mL — ABNORMAL LOW (ref 0.30–0.70)
Heparin Unfractionated: 0.36 IU/mL (ref 0.30–0.70)

## 2019-06-22 LAB — BASIC METABOLIC PANEL
Anion gap: 10 (ref 5–15)
BUN: 29 mg/dL — ABNORMAL HIGH (ref 6–20)
CO2: 29 mmol/L (ref 22–32)
Calcium: 8.8 mg/dL — ABNORMAL LOW (ref 8.9–10.3)
Chloride: 98 mmol/L (ref 98–111)
Creatinine, Ser: 3.54 mg/dL — ABNORMAL HIGH (ref 0.61–1.24)
GFR calc Af Amer: 22 mL/min — ABNORMAL LOW (ref >60)
GFR calc non Af Amer: 19 mL/min — ABNORMAL LOW (ref >60)
Glucose, Bld: 142 mg/dL — ABNORMAL HIGH (ref 70–99)
Potassium: 3.9 mmol/L (ref 3.5–5.1)
Sodium: 137 mmol/L (ref 135–145)

## 2019-06-22 LAB — PROTIME-INR
INR: 1.2 (ref 0.8–1.2)
Prothrombin Time: 15.2 seconds (ref 11.4–15.2)

## 2019-06-22 LAB — GLUCOSE, CAPILLARY
Glucose-Capillary: 136 mg/dL — ABNORMAL HIGH (ref 70–99)
Glucose-Capillary: 151 mg/dL — ABNORMAL HIGH (ref 70–99)
Glucose-Capillary: 175 mg/dL — ABNORMAL HIGH (ref 70–99)
Glucose-Capillary: 196 mg/dL — ABNORMAL HIGH (ref 70–99)

## 2019-06-22 MED ORDER — DARBEPOETIN ALFA 150 MCG/0.3ML IJ SOSY: 150.0000 ug | PREFILLED_SYRINGE | INTRAMUSCULAR | Status: DC

## 2019-06-22 MED ORDER — CHLORHEXIDINE GLUCONATE CLOTH 2 % EX PADS
6.0000 | MEDICATED_PAD | Freq: Every day | CUTANEOUS | Status: DC
  Administered 2019-06-25 – 2019-06-26 (×2): 6 via TOPICAL

## 2019-06-22 MED ORDER — WARFARIN SODIUM 7.5 MG PO TABS
7.5000 mg | ORAL_TABLET | Freq: Once | ORAL | Status: AC
  Administered 2019-06-22: 7.5 mg via ORAL
  Filled 2019-06-22: qty 1

## 2019-06-22 MED ORDER — SALINE SPRAY 0.65 % NA SOLN
1.0000 | NASAL | Status: DC | PRN
  Administered 2019-06-22 (×2): 1 via NASAL
  Filled 2019-06-22: qty 44

## 2019-06-22 NOTE — Progress Notes (Signed)
ANTICOAGULATION CONSULT NOTE - Follow-Up Consult  Pharmacy Consult for heparin IV Indication: h/o pulmonary embolus and DVT  Patient Measurements: Height: 6\' 3"  (190.5 cm) Weight: (!) 432 lb 1.6 oz (196 kg) IBW/kg (Calculated) : 84.5 Heparin Dosing Weight: 131.1 kg  Vital Signs: Temp: 99.8 F (37.7 C) (12/24 1644) Temp Source: Oral (12/24 1644) BP: 115/54 (12/24 1644) Pulse Rate: 73 (12/24 1644)  Labs:  Recent Labs    06/19/19 1513 06/20/19 0504 06/20/19 1413 06/21/19 0424 06/21/19 0835 06/22/19 0430 06/22/19 0918  HGB   < > 7.4*  --  9.1* 8.2* 8.5*  --   HCT   < > 23.6*  --  29.8* 26.7* 27.3*  --   PLT   < > 83*  --  80* 86* 85*  --   LABPROT  --  15.5*  --  15.6*  --  15.2  --   INR  --  1.2  --  1.3*  --  1.2  --   HEPARINUNFRC   < > <0.10*  --  0.52  --  0.17* 0.26*  CREATININE  --   --  3.84*  --  4.40* 3.54*  --    < > = values in this interval not displayed.    Estimated Creatinine Clearance: 44.6 mL/min (A) (by C-G formula based on SCr of 3.54 mg/dL (H)).   Assessment: 61 yr ole male with a PMH significant for multiple DVT/PE, on warfarin PTA. Warfarin was held due to gross hematuria on admit (S/P 5 unit RBCs). Heparin bridge started 12/20 - then held 12/21 AM for AVF placement; held again 12/23 AM for a tunneled HD cath. Heparin was resumed 6 hr post procedure.  Heparin level this evening is 0.36 units/ml, which is within the goal range for this pt. Per RN, no issues with IV or bleeding observed.  INR today remains subtherapeutic (INR 1.2, goal of 2-3). H/H and plt low, but stable (has chronic thrombocytopenia).  Goal of Therapy:  Heparin level 0.3-0.5 units/ml Monitor platelets by anticoagulation protocol: Yes   Plan:  Continue heparin at 2700 units/hr Check 6-hr heparin level Warfarin 7.5 mg x 1  Monitor daily PT/INR, CBC, heparin level Monitor for signs/symptoms of bleeding   Gillermina Hu, PharmD, BCPS, [facility] Clinical Pharmacist 06/22/19,  19:56 PM

## 2019-06-22 NOTE — Progress Notes (Signed)
[facility] NEPHROLOGY PROGRESS NOTE  Assessment/ Plan: Pt is a 53 y.o. yo male with morbid obesity, OSA, pulmonary embolus, seizure, DM, HTN, RA, bipolar, CKD 4/5 follows at [place], multiple recent hospitalization for CIVID-PNA, urosepsis, AKI admitted with decreased urine output, gross hematuria and worsening renal failure.  [facility] for HD.  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  He was oliguric and uremic.  Status post right IJ non-tunnel catheter placed by IR -  convert [facility] 12/23,  Status post left brachiocephalic AV fistula creation by Dr. Scot Dock on 12/21. First HD on 12/17, tolerated 3 treatments with significant clinical improvement.  4th tx 12/23. Needs to have enough mobility to get into HD chair to dialyze OP- discussed with him.  He will need outpatient HD set up at [facility].  Next HD will  be on Saturday (holiday sched)   #Gross hematuria CT scan with hemorrhagic right kidney cyst.  He has Foley catheter.  Seen by urology,  conservative management.  #Anemia due to CKD and blood loss: Iron saturation 16%.  Continue IV iron and Aranesp during HD- inc dose.  Received PRBC this hosp.  #CKD-MBD: Phosphorus level elevated. PTH  86.  Continue phosphorus binders (phoslo) .  Lab expect to improve with HD.  #Acute encephalopathy, due to medication and uremia:  Mental status has improved.  #Morbid obesity  Subjective: HD yest- removed 3000- tolerated well- then had temp cath converted to tunneled - sleepy today  Does not think that PTH has worked with him yet   Objective Vital signs in last 24 hours: Vitals:   06/21/19 1350 06/21/19 2110 06/22/19 0530 06/22/19 1105  BP: (!) 105/47 (!) 106/58 102/68 (!) 115/47  Pulse: 81 75 88 75  Resp: 16 16 16 16   Temp:  99 F (37.2 C) 98.3 F (36.8 C)   TempSrc:  Oral Oral   SpO2: 100% 97% 94% 95%  Weight:      Height:       Weight  change:   Intake/Output Summary (Last 24 hours) at 06/22/2019 1120 Last data filed at 06/21/2019 1213 Gross per 24 hour  Intake --  Output 3000 ml  Net -3000 ml       Labs: Basic Metabolic Panel: Recent Labs  Lab 06/19/19 1513 06/20/19 1413 06/21/19 0835 06/22/19 0430  NA 140 136 138 137  K 4.9 4.0 4.1 3.9  CL 104 99 99 98  CO2 27 29 30 29   GLUCOSE 150* 159* 152* 142*  BUN 42* 30* 43* 29*  CREATININE 6.21* 3.84* 4.40* 3.54*  CALCIUM 8.7* 8.6* 8.7* 8.8*  PHOS 5.5* 4.3 6.3*  --    Liver Function Tests: Recent Labs  Lab 06/19/19 1513 06/20/19 1413 06/21/19 0835  ALBUMIN 2.5* 2.5* 2.4*   No results for input(s): LIPASE, AMYLASE in the last 168 hours. No results for input(s): AMMONIA in the last 168 hours. CBC: Recent Labs  Lab 06/19/19 1513 06/20/19 0504 06/21/19 0424 06/21/19 0835 06/22/19 0430  WBC 2.8* 3.1* 4.2 3.4* 4.2  HGB 7.8* 7.4* 9.1* 8.2* 8.5*  HCT 25.4* 23.6* 29.8* 26.7* 27.3*  MCV 99.2 98.7 100.3* 99.6 98.2  PLT 81* 83* 80* 86* 85*   Cardiac Enzymes: No results for input(s): CKTOTAL, CKMB, CKMBINDEX, TROPONINI in the last 168 hours. CBG: Recent Labs  Lab 06/20/19 2059 06/21/19 1437 06/21/19 1752 06/21/19 2112 06/22/19 0656  GLUCAP 142* 129* 158* 171* 136*  Iron Studies: No results for input(s): IRON, TIBC, TRANSFERRIN, FERRITIN in the last 72 hours. Studies/Results: IR Fluoro Guide CV Line Right  Result Date: 06/21/2019 CLINICAL DATA:  Renal failure, needs durable venous access for hemodialysis until fistula matures EXAM: TUNNELED HEMODIALYSIS CATHETER PLACEMENT WITH ULTRASOUND AND FLUOROSCOPIC GUIDANCE TECHNIQUE: The procedure, risks, benefits, and alternatives were explained to the patient. Questions regarding the procedure were encouraged and answered. The patient understands and consents to the procedure. As antibiotic prophylaxis, cefazolin 2 g was ordered pre-procedure and administered intravenously within one hour of  incision.Patency of the right IJ vein was confirmed with ultrasound with image documentation. An appropriate skin site was determined. Region was prepped using maximum barrier technique including cap and mask, sterile gown, sterile gloves, large sterile sheet, and Chlorhexidine as cutaneous antisepsis. The region was infiltrated locally with 1% lidocaine. Intravenous Fentanyl 99mcg and Versed 1mg  were administered as conscious sedation during continuous monitoring of the patient's level of consciousness and physiological / cardiorespiratory status by the radiology RN, with a total moderate sedation time of 10 minutes. Under real-time ultrasound guidance, the right IJ vein was accessed with a 21 gauge micropuncture needle; the needle tip within the vein was confirmed with ultrasound image documentation. Needle exchanged over the 018 guidewire for transitional dilator, which allowed advancement of a Benson wire into the IVC. Over this, an MPA catheter was advanced. A Palindrome 23 hemodialysis catheter was tunneled from the right anterior chest wall approach to the right IJ dermatotomy site. The MPA catheter was exchanged over an Amplatz wire for serial vascular dilators which allow placement of a peel-away sheath, through which the catheter was advanced under intermittent fluoroscopy, positioned with its tips in the proximal and midright atrium. Spot chest radiograph confirms good catheter position. No pneumothorax. Catheter was flushed and primed per protocol. Catheter secured externally with O Prolene sutures. The right IJ dermatotomy site was closed with Dermabond. COMPLICATIONS: COMPLICATIONS None immediate FLUOROSCOPY TIME:  12 seconds; 11 mGy COMPARISON:  None IMPRESSION: 1. Technically successful placement of tunneled right IJ hemodialysis catheter with ultrasound and fluoroscopic guidance. Ready for routine use. Electronically Signed   By: Lucrezia Europe M.D.   On: 06/21/2019 14:02   IR US Guide Vasc Access  Right  Result Date: 06/21/2019 CLINICAL DATA:  Renal failure, needs durable venous access for hemodialysis until fistula matures EXAM: TUNNELED HEMODIALYSIS CATHETER PLACEMENT WITH ULTRASOUND AND FLUOROSCOPIC GUIDANCE TECHNIQUE: The procedure, risks, benefits, and alternatives were explained to the patient. Questions regarding the procedure were encouraged and answered. The patient understands and consents to the procedure. As antibiotic prophylaxis, cefazolin 2 g was ordered pre-procedure and administered intravenously within one hour of incision.Patency of the right IJ vein was confirmed with ultrasound with image documentation. An appropriate skin site was determined. Region was prepped using maximum barrier technique including cap and mask, sterile gown, sterile gloves, large sterile sheet, and Chlorhexidine as cutaneous antisepsis. The region was infiltrated locally with 1% lidocaine. Intravenous Fentanyl 43mcg and Versed 1mg  were administered as conscious sedation during continuous monitoring of the patient's level of consciousness and physiological / cardiorespiratory status by the radiology RN, with a total moderate sedation time of 10 minutes. Under real-time ultrasound guidance, the right IJ vein was accessed with a 21 gauge micropuncture needle; the needle tip within the vein was confirmed with ultrasound image documentation. Needle exchanged over the 018 guidewire for transitional dilator, which allowed advancement of a Benson wire into the IVC. Over this, an MPA catheter was advanced. A  Palindrome 23 hemodialysis catheter was tunneled from the right anterior chest wall approach to the right IJ dermatotomy site. The MPA catheter was exchanged over an Amplatz wire for serial vascular dilators which allow placement of a peel-away sheath, through which the catheter was advanced under intermittent fluoroscopy, positioned with its tips in the proximal and midright atrium. Spot chest radiograph confirms  good catheter position. No pneumothorax. Catheter was flushed and primed per protocol. Catheter secured externally with O Prolene sutures. The right IJ dermatotomy site was closed with Dermabond. COMPLICATIONS: COMPLICATIONS None immediate FLUOROSCOPY TIME:  12 seconds; 11 mGy COMPARISON:  None IMPRESSION: 1. Technically successful placement of tunneled right IJ hemodialysis catheter with ultrasound and fluoroscopic guidance. Ready for routine use. Electronically Signed   By: Lucrezia Europe M.D.   On: 06/21/2019 14:02    Medications: Infusions: . sodium chloride 250 mL (06/15/19 1130)  . sodium chloride    . ferric gluconate (FERRLECIT/NULECIT) IV 125 mg (06/21/19 1400)  . heparin 2,700 Units/hr (06/21/19 2355)    Scheduled Medications: . busPIRone  15 mg Oral BID  . calcium acetate  1,334 mg Oral TID WC  . Chlorhexidine Gluconate Cloth  6 each Topical Q0600  . darbepoetin (ARANESP) injection - DIALYSIS  60 mcg Intravenous Q Wed-HD  . DULoxetine  60 mg Oral Daily  . fentaNYL  1 patch Transdermal Q3 days  . finasteride  5 mg Oral Daily  . insulin aspart  0-9 Units Subcutaneous TID WC  . insulin glargine  45 Units Subcutaneous BID  . lamoTRIgine  200 mg Oral BID  . levETIRAcetam  500 mg Oral BID  . pregabalin  50 mg Oral TID  . simvastatin  20 mg Oral QHS  . tamsulosin  0.4 mg Oral Daily  . traZODone  50 mg Oral QHS  . warfarin  7.5 mg Oral ONCE-1800  . Warfarin - Pharmacist Dosing Inpatient   Does not apply q1800    have reviewed scheduled and prn medications.  Physical Exam: General: Morbidly obese male lying on bed comfortable, not in distress Heart:RRR, s1s2 nl, no rub Lungs: Distant breath sound, no crackle appreciated Abdomen:soft, Non-tender, non-distended Extremities: Bilateral lower extremity edema, improving Neurology: More alert awake and pleasant. Dialysis Access: Right  tunneled placed by IR on 12/23.  Left AV fistula placed 12/21 has good thrill and bruit, wound looks  good.  Edda Orea A Kylil Swopes 06/22/2019,11:20 AM  LOS: 11 days

## 2019-06-22 NOTE — Plan of Care (Signed)
  Problem: Education: Goal: Knowledge of General Education information will improve Description Including pain rating scale, medication(s)/side effects and non-pharmacologic comfort measures Outcome: Progressing   

## 2019-06-22 NOTE — Progress Notes (Signed)
[facility]  PROGRESS NOTE  [name] [id number] DOB: [birth year] DOA: 06/11/2019 PCP: Sharion Balloon, FNP Admit date - 06/11/2019   Admitting Physician Bethena Roys, MD  Outpatient Primary MD for the patient is Sharion Balloon, FNP  LOS - 11 Brief Narrative   [name] is a 53 y.o. year old male with medical history significant for CKD stage V, chronic respiratory failure on 3 L O2, OSA/OHS, diabetes mellitus, pulmonary embolism DVT, bipolar disorder, prior Covid pneumonia (admitted from 10/11-10/27) multiple hospitalizations with most recent 12/6-12/10 for UTI with gross hematuria complicated by pyelonephritis and acute on chronic CKD for which patient was discharged on Augmentin, Flomax with renal follow-up but presented again on 06/11/2019 to [facility] with reports of inability to void and was found to have a creatinine of 9.42 (previous baseline of 5.4, 12/10).  Was admitted with working diagnosis of AKI on CKD stage V.  Patient was transferred to [facility] on 12/15 to initiate HD.    Of note, patient has had several hospitalizations including: 12/6-12/10: UTI/pyelonephritis with acute on chronic kidney disease, urine culture growing E faecalis, discharged on Augmentin from  11/10-11/16 AKI on CKD stage V secondary to ischemic ATN related to hypotension from sepsis secondary to E faecalis UTI, pulmonary edema versus infiltrate to sequelae of his recent COVID-19 infection  10/11-10/27 acute on chronic respiratory failure secondary to pneumonia due to COVID-19 virus infection for which he did not require intubation treated with remdesivir or steroids.  Significant events: 12/13 admitted to [facility] 12/15 transferred to [facility]. 12/15 1 unit PRBC 12/16 1 unit PRBC transfusion 12/16 TDC insertion by IR 12/17 initiation hemodialysis 12/17 2 units PRBC transfusions   Subjective  [name] today has been no acute complaints.   Was able to get more rest last night.  Denies chest pain, no shortness of breath, no cough, no abdominal pain. A & P   AKI on CKD stage V, now progressed to ESRD. Patient was oliguric and uremic on presentation. Patient is tolerating dialysis (started on 12/17) via tunnel catheter (placed by IR on 12/23) with normal mentation Status left post brachiocephalic AV fistula by Dr. Doren Custard on 12/21 -Next HD session on 12/26 per nephro -Working on mobility to get into HD chair, which is necessary for outpatient dialysis -Plan for outpatient HD set up at [facility]  Gross hematuria and acute blood loss anemia Hemorrhagic right kidney cyst on CT scan.  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  Previously was holding anticoagulation and requiring multiple blood transfusions, has not had recurrent episodes of hematuria and is tolerating heparin drip -Warfarin protocol per pharmacy while on heparin bridge  Chronic anemia due to CKD with thrombocytopenia Status post 5 units blood transfusions during hospital course.  Hemoglobin and platelets stable.  No active signs or symptoms of bleeding currently -Monitor CBC  Acute metabolic encephalopathy, resolved Likely multifactorial including uremia, as well as medication induced due to multiple narcotics in the setting of worsening kidney dysfunction and poor clearance. Improved while holding home buspar, cymbalta, lyrica, narcotics and HD Currently alert and oriented x4 -Close monitor mental status -Continue his home psychotropic medications  Chronic respiratory failure on home 3 L O2 OSA and obesity hypoventilation  syndrome Morbid obesity -Continue supplemental O2, wean O2 as able, currently on 4 L -Does not use CPAP  Mood disorder/bipolar disorder -BuSpar, duloxetine, Lamictal, Keppra and trazodone  Seizure history -Continue home Keppra/Lamictal  Chronic pain syndrome -Continue fentanyl patch, tolerating well now that on HD  Type 2 diabetes with hyperlipidemia A1c 7.9 (03/2019) CBGs at goal -Continue Lantus 45 units twice daily (home regimen Lantus 60 units twice daily), sliding scale insulin with meals -Continue simvastatin  Debility Patient is wheelchair-bound at baseline Further worsened by current hospitalization Need to work on mobility able to tolerate outpatient HD    Family Communication  : No family at bedside  Code Status : Full code  Disposition Plan  : Close monitoring mental status, continued HD, need to increase mobilization to be able to withstand HD as outpatient safely  Consults  : Nephrology, IR, urology  Procedures  :  Status left post brachiocephalic AV fistula by Dr. Doren Custard on 12/21 Tunneled dialysis catheter by IR on 12/23  DVT Prophylaxis  : Heparin bridge to warfarin  Lab Results  Component Value Date   PLT 85 (L) 06/22/2019    Diet :  Diet Order            Diet heart healthy/carb modified Room service appropriate? Yes; Fluid consistency: Thin  Diet effective now               Inpatient Medications Scheduled Meds:  busPIRone  15 mg Oral BID   calcium acetate  1,334 mg Oral TID WC   Chlorhexidine Gluconate Cloth  6 each Topical Q0600   Chlorhexidine Gluconate Cloth  6 each Topical Q0600   [START ON 06/24/2019] darbepoetin (ARANESP) injection - DIALYSIS  150 mcg Intravenous Q Sat-HD   DULoxetine  60 mg Oral Daily   fentaNYL  1 patch Transdermal Q3 days   finasteride  5 mg Oral Daily   insulin aspart  0-9 Units Subcutaneous TID WC   insulin glargine  45 Units Subcutaneous BID   lamoTRIgine  200 mg Oral BID   levETIRAcetam  500  mg Oral BID   pregabalin  50 mg Oral TID   simvastatin  20 mg Oral QHS   tamsulosin  0.4 mg Oral Daily   traZODone  50 mg Oral QHS   warfarin  7.5 mg Oral ONCE-1800   Warfarin - Pharmacist Dosing Inpatient   Does not apply q1800   Continuous Infusions:  sodium chloride 250 mL (06/15/19 1130)   sodium chloride     ferric gluconate (FERRLECIT/NULECIT) IV 125 mg (06/22/19 1223)   heparin 2,700 Units/hr (06/21/19 2355)   PRN Meds:.sodium chloride, acetaminophen **OR** acetaminophen, albuterol, fluticasone, HYDROcodone-acetaminophen, ondansetron **OR** ondansetron (ZOFRAN) IV, polyethylene glycol  Antibiotics  :   Anti-infectives (From admission, onward)   Start     Dose/Rate Route Frequency Ordered Stop   06/21/19 1257  ceFAZolin (ANCEF) 2-4 GM/100ML-% IVPB    Note to Pharmacy: Rudene Re   : cabinet override      06/21/19 1257 06/21/19 1611   06/21/19 0000  ceFAZolin (ANCEF) IVPB 2g/100 mL premix     2 g 200 mL/hr over 30 Minutes Intravenous To Radiology 06/20/19 1348 06/21/19 1337   06/19/19 0600  cefUROXime (ZINACEF) 1.5 g in sodium chloride 0.9 % 100 mL IVPB     1.5 g 200 mL/hr over 30 Minutes Intravenous On call to O.R. 06/18/19 [id number] 06/19/19 1145  06/11/19 2200  amoxicillin-clavulanate (AUGMENTIN) 500-125 MG per tablet 500 mg  Status:  Discontinued     1 tablet Oral 2 times daily 06/11/19 2114 06/14/19 0135       Objective   Vitals:   06/21/19 1350 06/21/19 2110 06/22/19 0530 06/22/19 1105  BP: (!) 105/47 (!) 106/58 102/68 (!) 115/47  Pulse: 81 75 88 75  Resp: 16 16 16 16   Temp:  99 F (37.2 C) 98.3 F (36.8 C)   TempSrc:  Oral Oral   SpO2: 100% 97% 94% 95%  Weight:      Height:        SpO2: 95 % O2 Flow Rate (L/min): 4 L/min FiO2 (%): (!) 4 %  Wt Readings from Last 3 Encounters:  06/20/19 (!) 196 kg  06/04/19 (!) 197.8 kg  05/11/19 (!) 196.2 kg     Intake/Output Summary (Last 24 hours) at 06/22/2019 1251 Last data filed at 06/22/2019  1231 Gross per 24 hour  Intake 100 ml  Output 0 ml  Net 100 ml    Physical Exam:  Awake Alert, Oriented X 3, sleepy but easily arousable, normal affect No new F.N deficits,  [place].AT, Symmetrical Chest wall movement, Good air movement bilaterally difficult to appreciate breath sounds due to body habitus on 4 L nasal cannula with no respiratory distress RRR,No Gallops,Rubs or new Murmurs,  +ve B.Sounds, Abd Soft, No tenderness,  No rebound, guarding or rigidity. No peripheral edema   I have personally reviewed the following:   Data Reviewed:  CBC Recent Labs  Lab 06/19/19 1513 06/20/19 0504 06/20/19 2343 06/21/19 0424 06/21/19 0835 06/22/19 0430  WBC 2.8* 3.1*  --  4.2 3.4* 4.2  HGB 7.8* 7.4* 8.4* 9.1* 8.2* 8.5*  HCT 25.4* 23.6* 26.5* 29.8* 26.7* 27.3*  PLT 81* 83*  --  80* 86* 85*  MCV 99.2 98.7  --  100.3* 99.6 98.2  MCH 30.5 31.0  --  30.6 30.6 30.6  MCHC 30.7 31.4  --  30.5 30.7 31.1  RDW 14.5 14.3  --  14.7 14.6 14.4    Chemistries  Recent Labs  Lab 06/19/19 0537 06/19/19 1513 06/20/19 1413 06/21/19 0835 06/22/19 0430  NA 139   138 140 136 138 137  K 3.9   3.8 4.9 4.0 4.1 3.9  CL 102   101 104 99 99 98  CO2 28   28 27 29 30 29   GLUCOSE 106*   109* 150* 159* 152* 142*  BUN 41*   40* 42* 30* 43* 29*  CREATININE 5.73*   5.72* 6.21* 3.84* 4.40* 3.54*  CALCIUM 8.8*   8.6* 8.7* 8.6* 8.7* 8.8*   ------------------------------------------------------------------------------------------------------------------ No results for input(s): CHOL, HDL, LDLCALC, TRIG, CHOLHDL, LDLDIRECT in the last 72 hours.  Lab Results  Component Value Date   HGBA1C 7.9 (H) 04/10/2019   ------------------------------------------------------------------------------------------------------------------ No results for input(s): TSH, T4TOTAL, T3FREE, THYROIDAB in the last 72 hours.  Invalid input(s):  FREET3 ------------------------------------------------------------------------------------------------------------------ No results for input(s): VITAMINB12, FOLATE, FERRITIN, TIBC, IRON, RETICCTPCT in the last 72 hours.  Coagulation profile Recent Labs  Lab 06/18/19 0610 06/19/19 0537 06/20/19 0504 06/21/19 0424 06/22/19 0430  INR 1.2 1.3* 1.2 1.3* 1.2    No results for input(s): DDIMER in the last 72 hours.  Cardiac Enzymes No results for input(s): CKMB, TROPONINI, MYOGLOBIN in the last 168 hours.  Invalid input(s): CK ------------------------------------------------------------------------------------------------------------------    Component Value Date/Time   BNP 137.9 (H) 04/24/2019 0145    Micro Results Recent Results (  from the past 240 hour(s))  MRSA PCR Screening     Status: None   Collection Time: 06/15/19  4:40 AM   Specimen: Nasal Mucosa; Nasopharyngeal  Result Value Ref Range Status   MRSA by PCR NEGATIVE NEGATIVE Final    Comment:        The GeneXpert MRSA Assay (FDA approved for NASAL specimens only), is one component of a comprehensive MRSA colonization surveillance program. It is not intended to diagnose MRSA infection nor to guide or monitor treatment for MRSA infections. Performed at [facility], [place] [address] [place], [place] [address]     Radiology Reports DG Knee 1-2 Views Right  Result Date: 06/12/2019 CLINICAL DATA:  Right knee trauma. EXAM: RIGHT KNEE - 1-2 VIEW COMPARISON:  None. FINDINGS: There is extensive prepatellar soft tissue swelling. There is a moderate-sized joint effusion. There are end-stage degenerative changes of the patellofemoral compartment. There are advanced degenerative changes of the medial and lateral compartments. Chondrocalcinosis is noted. There is no definite acute displaced fracture or dislocation. IMPRESSION: 1. Extensive prepatellar soft tissue swelling and moderate-sized joint effusion. 2. No definite  acute displaced fracture or dislocation. If there is high clinical suspicion for an occult fracture, follow-up with CT is recommended. 3. End-stage degenerative changes of the patellofemoral compartment. 4. Chondrocalcinosis. Electronically Signed   By: Constance Holster M.D.   On: 06/12/2019 19:09   CT Head Wo Contrast  Result Date: 06/11/2019 CLINICAL DATA:  Recent fall with headaches EXAM: CT HEAD WITHOUT CONTRAST CT CERVICAL SPINE WITHOUT CONTRAST TECHNIQUE: Multidetector CT imaging of the head and cervical spine was performed following the standard protocol without intravenous contrast. Multiplanar CT image reconstructions of the cervical spine were also generated. COMPARISON:  None. FINDINGS: CT HEAD FINDINGS Brain: No evidence of acute infarction, hemorrhage, hydrocephalus, extra-axial collection or mass lesion/mass effect. Vascular: No hyperdense vessel or unexpected calcification. Skull: Normal. Negative for fracture or focal lesion. Sinuses/Orbits: No acute finding. Other: None. CT CERVICAL SPINE FINDINGS Alignment: Alignment is within normal limits although severe artifact is noted below the C5 vertebral body. The posterior elements of C6 and C7 are well visualized. Skull base and vertebrae: 7 cervical segments are visualized although the vertebral body at C6 and C7 is incompletely evaluated due to significant artifact. No acute fracture or acute facet abnormality is noted. Soft tissues and spinal canal: Surrounding soft tissue structures are within normal limits. Upper chest: Mild interstitial changes are seen. Other: None IMPRESSION: CT of the head: No acute intracranial abnormality noted. CT of the cervical spine: Significantly limited exam secondary to the patient's body habitus and inability to adequately position himself. No acute abnormality is noted. Electronically Signed   By: Inez Catalina M.D.   On: 06/11/2019 16:34   CT Cervical Spine Wo Contrast  Result Date: 06/11/2019 CLINICAL  DATA:  Recent fall with headaches EXAM: CT HEAD WITHOUT CONTRAST CT CERVICAL SPINE WITHOUT CONTRAST TECHNIQUE: Multidetector CT imaging of the head and cervical spine was performed following the standard protocol without intravenous contrast. Multiplanar CT image reconstructions of the cervical spine were also generated. COMPARISON:  None. FINDINGS: CT HEAD FINDINGS Brain: No evidence of acute infarction, hemorrhage, hydrocephalus, extra-axial collection or mass lesion/mass effect. Vascular: No hyperdense vessel or unexpected calcification. Skull: Normal. Negative for fracture or focal lesion. Sinuses/Orbits: No acute finding. Other: None. CT CERVICAL SPINE FINDINGS Alignment: Alignment is within normal limits although severe artifact is noted below the C5 vertebral body. The posterior elements of C6 and C7  are well visualized. Skull base and vertebrae: 7 cervical segments are visualized although the vertebral body at C6 and C7 is incompletely evaluated due to significant artifact. No acute fracture or acute facet abnormality is noted. Soft tissues and spinal canal: Surrounding soft tissue structures are within normal limits. Upper chest: Mild interstitial changes are seen. Other: None IMPRESSION: CT of the head: No acute intracranial abnormality noted. CT of the cervical spine: Significantly limited exam secondary to the patient's body habitus and inability to adequately position himself. No acute abnormality is noted. Electronically Signed   By: Inez Catalina M.D.   On: 06/11/2019 16:34   IR Fluoro Guide CV Line Right  Result Date: 06/21/2019 CLINICAL DATA:  Renal failure, needs durable venous access for hemodialysis until fistula matures EXAM: TUNNELED HEMODIALYSIS CATHETER PLACEMENT WITH ULTRASOUND AND FLUOROSCOPIC GUIDANCE TECHNIQUE: The procedure, risks, benefits, and alternatives were explained to the patient. Questions regarding the procedure were encouraged and answered. The patient understands and  consents to the procedure. As antibiotic prophylaxis, cefazolin 2 g was ordered pre-procedure and administered intravenously within one hour of incision.Patency of the right IJ vein was confirmed with ultrasound with image documentation. An appropriate skin site was determined. Region was prepped using maximum barrier technique including cap and mask, sterile gown, sterile gloves, large sterile sheet, and Chlorhexidine as cutaneous antisepsis. The region was infiltrated locally with 1% lidocaine. Intravenous Fentanyl 23mcg and Versed 1mg  were administered as conscious sedation during continuous monitoring of the patient's level of consciousness and physiological / cardiorespiratory status by the radiology RN, with a total moderate sedation time of 10 minutes. Under real-time ultrasound guidance, the right IJ vein was accessed with a 21 gauge micropuncture needle; the needle tip within the vein was confirmed with ultrasound image documentation. Needle exchanged over the 018 guidewire for transitional dilator, which allowed advancement of a Benson wire into the IVC. Over this, an MPA catheter was advanced. A Palindrome 23 hemodialysis catheter was tunneled from the right anterior chest wall approach to the right IJ dermatotomy site. The MPA catheter was exchanged over an Amplatz wire for serial vascular dilators which allow placement of a peel-away sheath, through which the catheter was advanced under intermittent fluoroscopy, positioned with its tips in the proximal and midright atrium. Spot chest radiograph confirms good catheter position. No pneumothorax. Catheter was flushed and primed per protocol. Catheter secured externally with O Prolene sutures. The right IJ dermatotomy site was closed with Dermabond. COMPLICATIONS: COMPLICATIONS None immediate FLUOROSCOPY TIME:  12 seconds; 11 mGy COMPARISON:  None IMPRESSION: 1. Technically successful placement of tunneled right IJ hemodialysis catheter with ultrasound and  fluoroscopic guidance. Ready for routine use. Electronically Signed   By: Lucrezia Europe M.D.   On: 06/21/2019 14:02   IR Fluoro Guide CV Line Right  Result Date: 06/14/2019 INDICATION: 53 year old male with acute renal failure in need of hemodialysis. He is currently anticoagulated and thrombocytopenic. Therefore, a temporary non tunneled hemodialysis catheter will be placed. EXAM: IR RIGHT FLUORO GUIDE CV LINE; IR ULTRASOUND GUIDANCE VASC ACCESS RIGHT MEDICATIONS: None ANESTHESIA/SEDATION: None FLUOROSCOPY TIME:  Fluoroscopy Time: 0 minutes 18 seconds (5.3 mGy). COMPLICATIONS: None immediate. PROCEDURE: Informed written consent was obtained from the patient after a thorough discussion of the procedural risks, benefits and alternatives. All questions were addressed. Maximal Sterile Barrier Technique was utilized including caps, mask, sterile gowns, sterile gloves, sterile drape, hand hygiene and skin antiseptic. A timeout was performed prior to the initiation of the procedure. The right internal jugular vein  was interrogated with ultrasound and found to be widely patent. An image was obtained and stored for the medical record. Local anesthesia was attained by infiltration with 1% lidocaine. A small dermatotomy was made. Under real-time sonographic guidance, the vessel was punctured with a 21 gauge micropuncture needle. Using standard technique, the initial micro needle was exchanged over a 0.018 micro wire for a transitional 4 [place] micro sheath. The micro sheath was then exchanged over a 0.035 wire for a fascial dilator and the soft tissue tract was dilated. A 20 cm triple-lumen non tunneled hemodialysis catheter was then advanced over the wire and position with the tip in the upper right atrium. The catheter flushes and aspirates easily. The catheter was flushed with heparinized saline and secured to the skin with 0 Prolene suture. A sterile bandage was applied. The catheter hubs were capped. IMPRESSION:  Successful placement of a right IJ approach 20 cm triple-lumen non tunneled hemodialysis catheter. The catheter tip is in the upper right atrium and ready for immediate use. Electronically Signed   By: Jacqulynn Cadet M.D.   On: 06/14/2019 18:59   IR US Guide Vasc Access Right  Result Date: 06/21/2019 CLINICAL DATA:  Renal failure, needs durable venous access for hemodialysis until fistula matures EXAM: TUNNELED HEMODIALYSIS CATHETER PLACEMENT WITH ULTRASOUND AND FLUOROSCOPIC GUIDANCE TECHNIQUE: The procedure, risks, benefits, and alternatives were explained to the patient. Questions regarding the procedure were encouraged and answered. The patient understands and consents to the procedure. As antibiotic prophylaxis, cefazolin 2 g was ordered pre-procedure and administered intravenously within one hour of incision.Patency of the right IJ vein was confirmed with ultrasound with image documentation. An appropriate skin site was determined. Region was prepped using maximum barrier technique including cap and mask, sterile gown, sterile gloves, large sterile sheet, and Chlorhexidine as cutaneous antisepsis. The region was infiltrated locally with 1% lidocaine. Intravenous Fentanyl 76mcg and Versed 1mg  were administered as conscious sedation during continuous monitoring of the patient's level of consciousness and physiological / cardiorespiratory status by the radiology RN, with a total moderate sedation time of 10 minutes. Under real-time ultrasound guidance, the right IJ vein was accessed with a 21 gauge micropuncture needle; the needle tip within the vein was confirmed with ultrasound image documentation. Needle exchanged over the 018 guidewire for transitional dilator, which allowed advancement of a Benson wire into the IVC. Over this, an MPA catheter was advanced. A Palindrome 23 hemodialysis catheter was tunneled from the right anterior chest wall approach to the right IJ dermatotomy site. The MPA catheter  was exchanged over an Amplatz wire for serial vascular dilators which allow placement of a peel-away sheath, through which the catheter was advanced under intermittent fluoroscopy, positioned with its tips in the proximal and midright atrium. Spot chest radiograph confirms good catheter position. No pneumothorax. Catheter was flushed and primed per protocol. Catheter secured externally with O Prolene sutures. The right IJ dermatotomy site was closed with Dermabond. COMPLICATIONS: COMPLICATIONS None immediate FLUOROSCOPY TIME:  12 seconds; 11 mGy COMPARISON:  None IMPRESSION: 1. Technically successful placement of tunneled right IJ hemodialysis catheter with ultrasound and fluoroscopic guidance. Ready for routine use. Electronically Signed   By: Lucrezia Europe M.D.   On: 06/21/2019 14:02   IR US Guide Vasc Access Right  Result Date: 06/14/2019 INDICATION: 53 year old male with acute renal failure in need of hemodialysis. He is currently anticoagulated and thrombocytopenic. Therefore, a temporary non tunneled hemodialysis catheter will be placed. EXAM: IR RIGHT FLUORO GUIDE CV LINE; IR ULTRASOUND  GUIDANCE VASC ACCESS RIGHT MEDICATIONS: None ANESTHESIA/SEDATION: None FLUOROSCOPY TIME:  Fluoroscopy Time: 0 minutes 18 seconds (5.3 mGy). COMPLICATIONS: None immediate. PROCEDURE: Informed written consent was obtained from the patient after a thorough discussion of the procedural risks, benefits and alternatives. All questions were addressed. Maximal Sterile Barrier Technique was utilized including caps, mask, sterile gowns, sterile gloves, sterile drape, hand hygiene and skin antiseptic. A timeout was performed prior to the initiation of the procedure. The right internal jugular vein was interrogated with ultrasound and found to be widely patent. An image was obtained and stored for the medical record. Local anesthesia was attained by infiltration with 1% lidocaine. A small dermatotomy was made. Under real-time  sonographic guidance, the vessel was punctured with a 21 gauge micropuncture needle. Using standard technique, the initial micro needle was exchanged over a 0.018 micro wire for a transitional 4 [place] micro sheath. The micro sheath was then exchanged over a 0.035 wire for a fascial dilator and the soft tissue tract was dilated. A 20 cm triple-lumen non tunneled hemodialysis catheter was then advanced over the wire and position with the tip in the upper right atrium. The catheter flushes and aspirates easily. The catheter was flushed with heparinized saline and secured to the skin with 0 Prolene suture. A sterile bandage was applied. The catheter hubs were capped. IMPRESSION: Successful placement of a right IJ approach 20 cm triple-lumen non tunneled hemodialysis catheter. The catheter tip is in the upper right atrium and ready for immediate use. Electronically Signed   By: Jacqulynn Cadet M.D.   On: 06/14/2019 18:59   CT Renal Stone Study  Result Date: 06/11/2019 CLINICAL DATA:  Fall yesterday, rt hip pain. Pt recently discharged from [facility] Multiple UTI since then after foley removed. Here for inability to void, as well as burning upon urination. History of DM, Migraine, seizure, CKD, DVT, PE. Pt unable to hold arms up for abd images due to prior shoulder surgery. EXAM: CT ABDOMEN AND PELVIS WITHOUT CONTRAST TECHNIQUE: Multidetector CT imaging of the abdomen and pelvis was performed following the standard protocol without IV contrast. COMPARISON:  06/04/2019 FINDINGS: Lower chest: There mild hazy opacities at the lung bases as well as linear areas of presumed atelectasis or scarring, similar to the prior CT allowing artifact from overlying arms on the current exam. No new lung base abnormalities. Hepatobiliary: Liver demonstrates central volume loss and relative enlargement of the lateral segment left lobe, findings suggesting cirrhosis. No liver mass or focal lesion. Status post cholecystectomy. No  bile duct dilation Pancreas: Significant fatty replacement. No defined mass or inflammation. Spleen: Enlarged, 20 x 7.8 x 21 cm. This is stable from recent exam. No splenic mass or focal lesion. Adrenals/Urinary Tract: No adrenal mass. Small peripherally calcified cysts reflecting residual left kidney, unchanged. Right kidney shows mild dilation of the intrarenal collecting system. Collecting system contents demonstrate increased attenuation similar to prior CT. There is a mass in the lower pole with peripheral increased attenuation. Mass measures 4.7 x 3.4 x 3.2 cm. The mass corresponds to a complex cyst noted on ultrasound dated 05/11/2019. Findings are consistent hemorrhage into the cyst, which is communicating with the collecting system. No other evidence of mass. No stones. Right ureters normal in course and in caliber. No stones. Bladder is decompressed with a Foley catheter Stomach/Bowel: Stomach is unremarkable. Small bowel and colon are normal in caliber. No wall thickening. No inflammation. Appendix not visualized. No evidence of appendicitis. Vascular/Lymphatic: Mild aortic atherosclerotic calcifications. Enlarged splenic  and portal veins. Prominent to mildly enlarged lymph nodes adjacent to the portal vein, largest measuring 1.4 cm in short axis. No other enlarged lymph nodes. Reproductive: Unremarkable. Other: No ascites.  No abdominal wall hernia. Musculoskeletal: No fracture or acute finding. No osteoblastic or osteolytic lesions. IMPRESSION: 1. Hemorrhage into a right renal lower pole cysts, or dilated calyx, which is communicating with the right intrarenal collecting system. Right renal collecting system is mildly dilated. There is no ureteral dilation, however, no renal or ureteral stones. This appearance is similar to the recent prior CT. 2. Hazy opacities at the lung bases consistent residual COVID-19 infection, also stable from the recent prior abdomen and pelvis CT. 3. 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 4. Marked chronic atrophy of the left kidney with residual peripherally calcified cysts, stable. 5. Mild aortic atherosclerosis. 6. No fracture of the visualized skeletal structures, which includes the right proximal femur to the intertrochanteric region. Electronically Signed   By: Lajean Manes M.D.   On: 06/11/2019 16:59   CT Renal Stone Study  Result Date: 06/04/2019 CLINICAL DATA:  Flank pain. Recurrent urinary tract infections. EXAM: CT ABDOMEN AND PELVIS WITHOUT CONTRAST TECHNIQUE: Multidetector CT imaging of the abdomen and pelvis was performed following the standard protocol without IV contrast. COMPARISON:  12/28/2018 FINDINGS: Lower chest: Increased patchy areas airspace disease are seen in both lower lungs, right side greater than left. No evidence of pleural effusion. Hepatobiliary: No mass visualized on this unenhanced exam. Mild capsular nodularity and left and caudate lobe hypertrophy again seen, highly suspicious for cirrhosis. Prior cholecystectomy. No evidence of biliary obstruction. Pancreas: No mass or inflammatory process visualized on this unenhanced exam. Spleen: Moderate splenomegaly measuring 18-19 cm in length is stable and consistent with portal venous hypertension. Adrenals/Urinary tract: Chronic atrophy of the left kidney with several small peripherally calcified cyst is unchanged in appearance. Compensatory hypertrophy of the right kidney is again seen. Mild increase in hazy opacity is seen in the right perinephric region. Mild right pelvicaliectasis is seen containing high attenuation fluid suspicious for hemorrhage. No evidence of renal or ureteral calculi. Unremarkable unopacified urinary bladder. Stomach/Bowel: No evidence of obstruction, inflammatory process, or abnormal fluid collections. Vascular/Lymphatic: No pathologically enlarged lymph nodes identified. No  evidence of abdominal aortic aneurysm. Aortic atherosclerosis. Reproductive:  No mass or other significant abnormality. Other:  None. Musculoskeletal:  No suspicious bone lesions identified. IMPRESSION: 1. Increased mild right renal pelvicaliectasis and hazy perinephric opacity. No obstructing calculi identified. These findings are nonspecific, but pyelonephritis cannot be excluded. 2. Stable chronic left renal atrophy. 3. Stable findings of hepatic cirrhosis and portal venous hypertension. No evidence of ascites. 4. Increased patchy areas of airspace disease in both lower lungs, right side greater than left, suspicious for atypical infectious or inflammatory process. Aortic Atherosclerosis (ICD10-I70.0). Electronically Signed   By: Marlaine Hind M.D.   On: 06/04/2019 10:13   VAS [place] UPPER EXT VEIN MAPPING (PRE-OP AVF)  Result Date: 06/15/2019 UPPER EXTREMITY VEIN MAPPING  Indications: Pre-access. Limitations: patient body habitus, patient positioning, patient movement Comparison Study: No prior studies. Performing Technologist: Oliver Hum RVT  Examination Guidelines: A complete evaluation includes B-mode imaging, spectral Doppler, color Doppler, and power Doppler as needed of all accessible portions of each vessel. Bilateral testing is considered an integral part of a complete examination. Limited examinations for reoccurring indications may be performed as noted. +-----------------+-------------+----------+---------+  Right Cephalic    Diameter (cm)  Depth (cm) Findings   +-----------------+-------------+----------+---------+  Shoulder              0.54         1.41               +-----------------+-------------+----------+---------+  Prox upper arm        0.40         1.47    branching  +-----------------+-------------+----------+---------+  Mid upper arm         0.48         0.99               +-----------------+-------------+----------+---------+  Dist upper arm        0.60         0.51                +-----------------+-------------+----------+---------+  Antecubital fossa     0.42         0.41    branching  +-----------------+-------------+----------+---------+  Prox forearm          0.21         0.80               +-----------------+-------------+----------+---------+  Mid forearm           0.17         0.75               +-----------------+-------------+----------+---------+  Dist forearm          0.14         0.20               +-----------------+-------------+----------+---------+ +-----------------+-------------+----------+--------------+  Right Basilic     Diameter (cm) Depth (cm)    Findings     +-----------------+-------------+----------+--------------+  Shoulder                                   not visualized  +-----------------+-------------+----------+--------------+  Prox upper arm                             not visualized  +-----------------+-------------+----------+--------------+  Mid upper arm                              not visualized  +-----------------+-------------+----------+--------------+  Dist upper arm                             not visualized  +-----------------+-------------+----------+--------------+  Antecubital fossa                          not visualized  +-----------------+-------------+----------+--------------+  Prox forearm                               not visualized  +-----------------+-------------+----------+--------------+  Mid forearm                                not visualized  +-----------------+-------------+----------+--------------+  Distal forearm                             not visualized  +-----------------+-------------+----------+--------------+ +-----------------+-------------+----------+---------+  Left Cephalic  Diameter (cm) Depth (cm) Findings   +-----------------+-------------+----------+---------+  Shoulder              0.68         3.10               +-----------------+-------------+----------+---------+  Prox upper arm        0.44          1.16               +-----------------+-------------+----------+---------+  Mid upper arm         0.41         0.84               +-----------------+-------------+----------+---------+  Dist upper arm        0.57         0.43               +-----------------+-------------+----------+---------+  Antecubital fossa     0.42         0.46    branching  +-----------------+-------------+----------+---------+  Prox forearm          0.34         0.72               +-----------------+-------------+----------+---------+  Mid forearm           0.34         0.76    branching  +-----------------+-------------+----------+---------+  Dist forearm          0.33         0.37               +-----------------+-------------+----------+---------+ *See table(s) above for measurements and observations.  Diagnosing physician: Servando Snare MD Electronically signed by Servando Snare MD on 06/15/2019 at 3:37:10 PM.    Final      Time Spent in minutes  30     Desiree Hane M.D on 06/22/2019 at 12:51 PM  To page go to [email or web address] - password [facility]

## 2019-06-22 NOTE — Progress Notes (Signed)
ANTICOAGULATION CONSULT NOTE - Follow-Up Consult  Pharmacy Consult for heparin IV Indication: h/o pulmonary embolus and DVT  Patient Measurements: Height: 6\' 3"  (190.5 cm) Weight: (!) 432 lb 1.6 oz (196 kg) IBW/kg (Calculated) : 84.5 Heparin Dosing Weight: 131.1 kg  Vital Signs: Temp: 98.3 F (36.8 C) (12/24 0530) Temp Source: Oral (12/24 0530) BP: 102/68 (12/24 0530) Pulse Rate: 88 (12/24 0530)  Labs:  Recent Labs    06/19/19 1513 06/20/19 0504 06/20/19 1413 06/21/19 0424 06/21/19 0835 06/22/19 0430 06/22/19 0918  HGB   < > 7.4*  --  9.1* 8.2* 8.5*  --   HCT   < > 23.6*  --  29.8* 26.7* 27.3*  --   PLT   < > 83*  --  80* 86* 85*  --   LABPROT  --  15.5*  --  15.6*  --  15.2  --   INR  --  1.2  --  1.3*  --  1.2  --   HEPARINUNFRC   < > <0.10*  --  0.52  --  0.17* 0.26*  CREATININE  --   --  3.84*  --  4.40* 3.54*  --    < > = values in this interval not displayed.    Estimated Creatinine Clearance: 44.6 mL/min (A) (by C-G formula based on SCr of 3.54 mg/dL (H)).   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 Warfarin was held due to gross hematuria on admit s/p 5 unit RBCs. Heparin bridge started 12/20 - then held 12/21 AM for AVF placement; held again 12/23 AM for a tunneled HD cath. Heparin resumed 6hr post procedure.  HL 4.5hr after restarting heparin was low but given no bolus, no change was made and HL repeated 9hr after restart up to 0.27. Continue dose given lower goal range.  INR today remains SUBtherapeutic (INR 1.2, goal of 2-3). H/H & plt low but stable, note has chronic thrombocytopenia.  Goal of Therapy:  Heparin level 0.3-0.5 Monitor platelets by anticoagulation protocol: Yes   Plan:  - Continue heparin at 2700 units/hr - F/u confirmatory 1800 HL  - Warfarin 7.5 mg x 1  - Daily PT/INR, CBC, HL - Will continue to monitor for any signs/symptoms of bleeding   Thank you for allowing pharmacy to be a part of  this patient's care. Benetta Spar, PharmD, BCPS, BCCP Clinical Pharmacist  Please check AMION for all [place] phone numbers After 10:00 PM, call [place] [phone]

## 2019-06-23 LAB — PROTIME-INR
INR: 1.3 — ABNORMAL HIGH (ref 0.8–1.2)
Prothrombin Time: 15.8 seconds — ABNORMAL HIGH (ref 11.4–15.2)

## 2019-06-23 LAB — CBC
HCT: 25.5 % — ABNORMAL LOW (ref 39.0–52.0)
Hemoglobin: 8.2 g/dL — ABNORMAL LOW (ref 13.0–17.0)
MCH: 31.4 pg (ref 26.0–34.0)
MCHC: 32.2 g/dL (ref 30.0–36.0)
MCV: 97.7 fL (ref 80.0–100.0)
Platelets: 81 10*3/uL — ABNORMAL LOW (ref 150–400)
RBC: 2.61 MIL/uL — ABNORMAL LOW (ref 4.22–5.81)
RDW: 14.1 % (ref 11.5–15.5)
WBC: 4 10*3/uL (ref 4.0–10.5)
nRBC: 0 % (ref 0.0–0.2)

## 2019-06-23 LAB — GLUCOSE, CAPILLARY
Glucose-Capillary: 166 mg/dL — ABNORMAL HIGH (ref 70–99)
Glucose-Capillary: 130 mg/dL — ABNORMAL HIGH (ref 70–99)
Glucose-Capillary: 172 mg/dL — ABNORMAL HIGH (ref 70–99)
Glucose-Capillary: 217 mg/dL — ABNORMAL HIGH (ref 70–99)

## 2019-06-23 LAB — HEPARIN LEVEL (UNFRACTIONATED): Heparin Unfractionated: 0.38 IU/mL (ref 0.30–0.70)

## 2019-06-23 MED ORDER — DIPHENHYDRAMINE HCL 25 MG PO CAPS
25.0000 mg | ORAL_CAPSULE | Freq: Four times a day (QID) | ORAL | Status: DC | PRN
  Administered 2019-06-24 – 2019-07-03 (×5): 25 mg via ORAL
  Filled 2019-06-23 (×7): qty 1

## 2019-06-23 MED ORDER — WARFARIN SODIUM 7.5 MG PO TABS
7.5000 mg | ORAL_TABLET | Freq: Once | ORAL | Status: AC
  Administered 2019-06-23: 7.5 mg via ORAL
  Filled 2019-06-23: qty 1

## 2019-06-23 NOTE — Progress Notes (Signed)
[facility]  PROGRESS NOTE  [name] [id number] DOB: [birth year] DOA: 06/11/2019 PCP: Sharion Balloon, FNP Admit date - 06/11/2019   Admitting Physician Bethena Roys, MD  Outpatient Primary MD for the patient is Sharion Balloon, FNP  LOS - 12 Brief Narrative   [name] is a 53 y.o. year old male with medical history significant for CKD stage V, chronic respiratory failure on 3 L O2, OSA/OHS, diabetes mellitus, pulmonary embolism DVT, bipolar disorder, prior Covid pneumonia (admitted from 10/11-10/27) multiple hospitalizations with most recent 12/6-12/10 for UTI with gross hematuria complicated by pyelonephritis and acute on chronic CKD for which patient was discharged on Augmentin, Flomax with renal follow-up but presented again on 06/11/2019 to [facility] with reports of inability to void and was found to have a creatinine of 9.42 (previous baseline of 5.4, 12/10).  Was admitted with working diagnosis of AKI on CKD stage V.  Patient was transferred to [facility] on 12/15 to initiate HD.    Of note, patient has had several hospitalizations including: 12/6-12/10: UTI/pyelonephritis with acute on chronic kidney disease, urine culture growing E faecalis, discharged on Augmentin from  11/10-11/16 AKI on CKD stage V secondary to ischemic ATN related to hypotension from sepsis secondary to E faecalis UTI, pulmonary edema versus infiltrate to sequelae of his recent COVID-19 infection  10/11-10/27 acute on chronic respiratory failure secondary to pneumonia due to COVID-19 virus infection for which he did not require intubation treated with remdesivir or steroids.  Significant events: 12/13 admitted to [facility] 12/15 transferred to [facility]. 12/15 1 unit PRBC 12/16 1 unit PRBC transfusion 12/16 TDC insertion by IR 12/17 initiation hemodialysis 12/17 2 units PRBC transfusions   Subjective  Complains itching all over from adhesive and  requests benadryl Eating lunch No other complainsgs A & P   AKI on CKD stage V, now progressed to ESRD. Patient was oliguric and uremic on presentation. Patient is tolerating dialysis (started on 12/17) via tunnel catheter (placed by IR on 12/23) with normal mentation Status left post brachiocephalic AV fistula by Dr. Doren Custard on 12/21 -Next HD session on 12/26 per nephro -Working on mobility to get into HD chair, which is necessary for outpatient dialysis, continue PT therapy -Plan for outpatient HD set up at [facility]  Gross hematuria and acute blood loss anemia, resolved Hemorrhagic right kidney cyst on CT scan.  Urology consulted (12/17), given gross hematuria was improving no urgent need for cystoscopy given patient is a poor candidate for embolization versus nephrectomy. 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  Previously was holding anticoagulation and requiring multiple blood transfusions, has not had recurrent episodes of hematuria and is tolerating heparin drip while awaiting therapeutic INR -Warfarin protocol per pharmacy while on heparin bridge --Daily INR  Chronic anemia due to CKD with thrombocytopenia, stable Status post 5 units blood transfusions during hospital course.  Hemoglobin and platelets stable.  No active signs or symptoms of bleeding currently -Monitor CBC  Acute metabolic encephalopathy, resolved Likely multifactorial including uremia, as well as medication induced due to multiple narcotics in the setting of worsening kidney dysfunction and poor clearance. Improved while holding home buspar, cymbalta, lyrica, narcotics and HD Currently alert and oriented x4 -Close monitor mental status -Continue his home psychotropic medications  Chronic respiratory  failure on  home 3-4 L O2, stable OSA and obesity hypoventilation syndrome Morbid obesity -Continue supplemental O2, wean O2 as able, currently on 4 L -Does not use CPAP  Mood disorder/bipolar disorder -BuSpar, duloxetine, Lamictal, Keppra and trazodone  Seizure history -Continue home Keppra/Lamictal  Chronic pain syndrome -Continue fentanyl patch, tolerating well now that on HD  Type 2 diabetes with hyperlipidemia A1c 7.9 (03/2019) CBGs at goal -Continue Lantus 45 units twice daily (home regimen Lantus 60 units twice daily), sliding scale insulin with meals -Continue simvastatin  Debility Patient is wheelchair-bound at baseline Further worsened by current hospitalization Need to work on mobility so able to tolerate outpatient HD with PT hre    Family Communication  : No family at bedside  Code Status : Full code  Disposition Plan  : continued HD, need to increase mobilization to be able to withstand HD as outpatient safely, heparin until INR therapeutic  Consults  : Nephrology, IR, urology  Procedures  :  Status left post brachiocephalic AV fistula by Dr. Doren Custard on 12/21 Tunneled dialysis catheter by IR on 12/23  DVT Prophylaxis  : Heparin bridge to warfarin  Lab Results  Component Value Date   PLT 81 (L) 06/23/2019    Diet :  Diet Order            Diet heart healthy/carb modified Room service appropriate? Yes; Fluid consistency: Thin  Diet effective now               Inpatient Medications Scheduled Meds: . busPIRone  15 mg Oral BID  . calcium acetate  1,334 mg Oral TID WC  . Chlorhexidine Gluconate Cloth  6 each Topical Q0600  . Chlorhexidine Gluconate Cloth  6 each Topical Q0600  . [START ON 06/24/2019] darbepoetin (ARANESP) injection - DIALYSIS  150 mcg Intravenous Q Sat-HD  . DULoxetine  60 mg Oral Daily  . fentaNYL  1 patch Transdermal Q3 days  . finasteride  5 mg Oral Daily  . insulin aspart  0-9 Units Subcutaneous TID WC  . insulin glargine  45 Units  Subcutaneous BID  . lamoTRIgine  200 mg Oral BID  . levETIRAcetam  500 mg Oral BID  . pregabalin  50 mg Oral TID  . simvastatin  20 mg Oral QHS  . tamsulosin  0.4 mg Oral Daily  . traZODone  50 mg Oral QHS  . warfarin  7.5 mg Oral ONCE-1800  . Warfarin - Pharmacist Dosing Inpatient   Does not apply q1800   Continuous Infusions: . sodium chloride 250 mL (06/15/19 1130)  . sodium chloride    . heparin 2,700 Units/hr (06/23/19 0048)   PRN Meds:.sodium chloride, acetaminophen **OR** acetaminophen, albuterol, diphenhydrAMINE, fluticasone, HYDROcodone-acetaminophen, ondansetron **OR** ondansetron (ZOFRAN) IV, polyethylene glycol, sodium chloride  Antibiotics  :   Anti-infectives (From admission, onward)   Start     Dose/Rate Route Frequency Ordered Stop   06/21/19 1257  ceFAZolin (ANCEF) 2-4 GM/100ML-% IVPB    Note to Pharmacy: Rudene Re   : cabinet override      06/21/19 1257 06/21/19 1611   06/21/19 0000  ceFAZolin (ANCEF) IVPB 2g/100 mL premix     2 g 200 mL/hr over 30 Minutes Intravenous To Radiology 06/20/19 1348 06/21/19 1337   06/19/19 0600  cefUROXime (ZINACEF) 1.5 g in sodium chloride 0.9 % 100 mL IVPB     1.5 g 200 mL/hr over 30 Minutes Intravenous On call to O.R. 06/18/19 0959 06/19/19 1145   06/11/19 2200  amoxicillin-clavulanate (  AUGMENTIN) 500-125 MG per tablet 500 mg  Status:  Discontinued     1 tablet Oral 2 times daily 06/11/19 2114 06/14/19 0135       Objective   Vitals:   06/22/19 1644 06/22/19 2146 06/23/19 0441 06/23/19 1000  BP: (!) 115/54 (!) 115/55 (!) 92/58 (!) 124/50  Pulse: 73 71 72 70  Resp: 18 16 16 18   Temp: 99.8 F (37.7 C) 98.6 F (37 C) 98.5 F (36.9 C) 99 F (37.2 C)  TempSrc: Oral Oral Oral Oral  SpO2: 95% 96% 94% 99%  Weight:      Height:        SpO2: 99 % O2 Flow Rate (L/min): 4 L/min FiO2 (%): (!) 4 %  Wt Readings from Last 3 Encounters:  06/20/19 (!) 196 kg  06/04/19 (!) 197.8 kg  05/11/19 (!) 196.2 kg      Intake/Output Summary (Last 24 hours) at 06/23/2019 1456 Last data filed at 06/23/2019 1245 Gross per 24 hour  Intake 1404.01 ml  Output 550 ml  Net 854.01 ml    Physical Exam:  Awake Alert, Oriented X 3, in bed eating lunch, normal affect No new F.N deficits,  Symmetrical Chest wall movement, Good air movement bilaterally difficult to appreciate breath sounds due to body habitus on 4 L nasal cannula with no respiratory distress RRR,No Gallops,Rubs or new Murmurs,  +ve B.Sounds, Abd Soft, No tenderness,  No rebound, guarding or rigidity. No peripheral edema   I have personally reviewed the following:   Data Reviewed:  CBC Recent Labs  Lab 06/20/19 0504 06/20/19 2343 06/21/19 0424 06/21/19 0835 06/22/19 0430 06/23/19 0424  WBC 3.1*  --  4.2 3.4* 4.2 4.0  HGB 7.4* 8.4* 9.1* 8.2* 8.5* 8.2*  HCT 23.6* 26.5* 29.8* 26.7* 27.3* 25.5*  PLT 83*  --  80* 86* 85* 81*  MCV 98.7  --  100.3* 99.6 98.2 97.7  MCH 31.0  --  30.6 30.6 30.6 31.4  MCHC 31.4  --  30.5 30.7 31.1 32.2  RDW 14.3  --  14.7 14.6 14.4 14.1    Chemistries  Recent Labs  Lab 06/19/19 0537 06/19/19 1513 06/20/19 1413 06/21/19 0835 06/22/19 0430  NA 139  138 140 136 138 137  K 3.9  3.8 4.9 4.0 4.1 3.9  CL 102  101 104 99 99 98  CO2 28  28 27 29 30 29   GLUCOSE 106*  109* 150* 159* 152* 142*  BUN 41*  40* 42* 30* 43* 29*  CREATININE 5.73*  5.72* 6.21* 3.84* 4.40* 3.54*  CALCIUM 8.8*  8.6* 8.7* 8.6* 8.7* 8.8*   ------------------------------------------------------------------------------------------------------------------ No results for input(s): CHOL, HDL, LDLCALC, TRIG, CHOLHDL, LDLDIRECT in the last 72 hours.  Lab Results  Component Value Date   HGBA1C 7.9 (H) 04/10/2019   ------------------------------------------------------------------------------------------------------------------ No results for input(s): TSH, T4TOTAL, T3FREE, THYROIDAB in the last 72 hours.  Invalid  input(s): FREET3 ------------------------------------------------------------------------------------------------------------------ No results for input(s): VITAMINB12, FOLATE, FERRITIN, TIBC, IRON, RETICCTPCT in the last 72 hours.  Coagulation profile Recent Labs  Lab 06/19/19 0537 06/20/19 0504 06/21/19 0424 06/22/19 0430 06/23/19 0424  INR 1.3* 1.2 1.3* 1.2 1.3*    No results for input(s): DDIMER in the last 72 hours.  Cardiac Enzymes No results for input(s): CKMB, TROPONINI, MYOGLOBIN in the last 168 hours.  Invalid input(s): CK ------------------------------------------------------------------------------------------------------------------    Component Value Date/Time   BNP 137.9 (H) 04/24/2019 0145    Micro Results Recent Results (from the past 240 hour(s))  MRSA PCR Screening     Status: None   Collection Time: 06/15/19  4:40 AM   Specimen: Nasal Mucosa; Nasopharyngeal  Result Value Ref Range Status   MRSA by PCR NEGATIVE NEGATIVE Final    Comment:        The GeneXpert MRSA Assay (FDA approved for NASAL specimens only), is one component of a comprehensive MRSA colonization surveillance program. It is not intended to diagnose MRSA infection nor to guide or monitor treatment for MRSA infections. Performed at [facility], [address] [place], [place] [address]     Radiology Reports DG Knee 1-2 Views Right  Result Date: 06/12/2019 CLINICAL DATA:  Right knee trauma. EXAM: RIGHT KNEE - 1-2 VIEW COMPARISON:  None. FINDINGS: There is extensive prepatellar soft tissue swelling. There is a moderate-sized joint effusion. There are end-stage degenerative changes of the patellofemoral compartment. There are advanced degenerative changes of the medial and lateral compartments. Chondrocalcinosis is noted. There is no definite acute displaced fracture or dislocation. IMPRESSION: 1. Extensive prepatellar soft tissue swelling and moderate-sized joint effusion. 2.  No definite acute displaced fracture or dislocation. If there is high clinical suspicion for an occult fracture, follow-up with CT is recommended. 3. End-stage degenerative changes of the patellofemoral compartment. 4. Chondrocalcinosis. Electronically Signed   By: Constance Holster M.D.   On: 06/12/2019 19:09   CT Head Wo Contrast  Result Date: 06/11/2019 CLINICAL DATA:  Recent fall with headaches EXAM: CT HEAD WITHOUT CONTRAST CT CERVICAL SPINE WITHOUT CONTRAST TECHNIQUE: Multidetector CT imaging of the head and cervical spine was performed following the standard protocol without intravenous contrast. Multiplanar CT image reconstructions of the cervical spine were also generated. COMPARISON:  None. FINDINGS: CT HEAD FINDINGS Brain: No evidence of acute infarction, hemorrhage, hydrocephalus, extra-axial collection or mass lesion/mass effect. Vascular: No hyperdense vessel or unexpected calcification. Skull: Normal. Negative for fracture or focal lesion. Sinuses/Orbits: No acute finding. Other: None. CT CERVICAL SPINE FINDINGS Alignment: Alignment is within normal limits although severe artifact is noted below the C5 vertebral body. The posterior elements of C6 and C7 are well visualized. Skull base and vertebrae: 7 cervical segments are visualized although the vertebral body at C6 and C7 is incompletely evaluated due to significant artifact. No acute fracture or acute facet abnormality is noted. Soft tissues and spinal canal: Surrounding soft tissue structures are within normal limits. Upper chest: Mild interstitial changes are seen. Other: None IMPRESSION: CT of the head: No acute intracranial abnormality noted. CT of the cervical spine: Significantly limited exam secondary to the patient's body habitus and inability to adequately position himself. No acute abnormality is noted. Electronically Signed   By: Inez Catalina M.D.   On: 06/11/2019 16:34   CT Cervical Spine Wo Contrast  Result Date:  06/11/2019 CLINICAL DATA:  Recent fall with headaches EXAM: CT HEAD WITHOUT CONTRAST CT CERVICAL SPINE WITHOUT CONTRAST TECHNIQUE: Multidetector CT imaging of the head and cervical spine was performed following the standard protocol without intravenous contrast. Multiplanar CT image reconstructions of the cervical spine were also generated. COMPARISON:  None. FINDINGS: CT HEAD FINDINGS Brain: No evidence of acute infarction, hemorrhage, hydrocephalus, extra-axial collection or mass lesion/mass effect. Vascular: No hyperdense vessel or unexpected calcification. Skull: Normal. Negative for fracture or focal lesion. Sinuses/Orbits: No acute finding. Other: None. CT CERVICAL SPINE FINDINGS Alignment: Alignment is within normal limits although severe artifact is noted below the C5 vertebral body. The posterior elements of C6 and C7 are well visualized. Skull base and  vertebrae: 7 cervical segments are visualized although the vertebral body at C6 and C7 is incompletely evaluated due to significant artifact. No acute fracture or acute facet abnormality is noted. Soft tissues and spinal canal: Surrounding soft tissue structures are within normal limits. Upper chest: Mild interstitial changes are seen. Other: None IMPRESSION: CT of the head: No acute intracranial abnormality noted. CT of the cervical spine: Significantly limited exam secondary to the patient's body habitus and inability to adequately position himself. No acute abnormality is noted. Electronically Signed   By: Inez Catalina M.D.   On: 06/11/2019 16:34   IR Fluoro Guide CV Line Right  Result Date: 06/21/2019 CLINICAL DATA:  Renal failure, needs durable venous access for hemodialysis until fistula matures EXAM: TUNNELED HEMODIALYSIS CATHETER PLACEMENT WITH ULTRASOUND AND FLUOROSCOPIC GUIDANCE TECHNIQUE: The procedure, risks, benefits, and alternatives were explained to the patient. Questions regarding the procedure were encouraged and answered. The patient  understands and consents to the procedure. As antibiotic prophylaxis, cefazolin 2 g was ordered pre-procedure and administered intravenously within one hour of incision.Patency of the right IJ vein was confirmed with ultrasound with image documentation. An appropriate skin site was determined. Region was prepped using maximum barrier technique including cap and mask, sterile gown, sterile gloves, large sterile sheet, and Chlorhexidine as cutaneous antisepsis. The region was infiltrated locally with 1% lidocaine. Intravenous Fentanyl 104mcg and Versed 1mg  were administered as conscious sedation during continuous monitoring of the patient's level of consciousness and physiological / cardiorespiratory status by the radiology RN, with a total moderate sedation time of 10 minutes. Under real-time ultrasound guidance, the right IJ vein was accessed with a 21 gauge micropuncture needle; the needle tip within the vein was confirmed with ultrasound image documentation. Needle exchanged over the 018 guidewire for transitional dilator, which allowed advancement of a Benson wire into the IVC. Over this, an MPA catheter was advanced. A Palindrome 23 hemodialysis catheter was tunneled from the right anterior chest wall approach to the right IJ dermatotomy site. The MPA catheter was exchanged over an Amplatz wire for serial vascular dilators which allow placement of a peel-away sheath, through which the catheter was advanced under intermittent fluoroscopy, positioned with its tips in the proximal and midright atrium. Spot chest radiograph confirms good catheter position. No pneumothorax. Catheter was flushed and primed per protocol. Catheter secured externally with O Prolene sutures. The right IJ dermatotomy site was closed with Dermabond. COMPLICATIONS: COMPLICATIONS None immediate FLUOROSCOPY TIME:  12 seconds; 11 mGy COMPARISON:  None IMPRESSION: 1. Technically successful placement of tunneled right IJ hemodialysis catheter with  ultrasound and fluoroscopic guidance. Ready for routine use. Electronically Signed   By: Lucrezia Europe M.D.   On: 06/21/2019 14:02   IR Fluoro Guide CV Line Right  Result Date: 06/14/2019 INDICATION: 53 year old male with acute renal failure in need of hemodialysis. He is currently anticoagulated and thrombocytopenic. Therefore, a temporary non tunneled hemodialysis catheter will be placed. EXAM: IR RIGHT FLUORO GUIDE CV LINE; IR ULTRASOUND GUIDANCE VASC ACCESS RIGHT MEDICATIONS: None ANESTHESIA/SEDATION: None FLUOROSCOPY TIME:  Fluoroscopy Time: 0 minutes 18 seconds (5.3 mGy). COMPLICATIONS: None immediate. PROCEDURE: Informed written consent was obtained from the patient after a thorough discussion of the procedural risks, benefits and alternatives. All questions were addressed. Maximal Sterile Barrier Technique was utilized including caps, mask, sterile gowns, sterile gloves, sterile drape, hand hygiene and skin antiseptic. A timeout was performed prior to the initiation of the procedure. The right internal jugular vein was interrogated with ultrasound and found  to be widely patent. An image was obtained and stored for the medical record. Local anesthesia was attained by infiltration with 1% lidocaine. A small dermatotomy was made. Under real-time sonographic guidance, the vessel was punctured with a 21 gauge micropuncture needle. Using standard technique, the initial micro needle was exchanged over a 0.018 micro wire for a transitional 4 [place] micro sheath. The micro sheath was then exchanged over a 0.035 wire for a fascial dilator and the soft tissue tract was dilated. A 20 cm triple-lumen non tunneled hemodialysis catheter was then advanced over the wire and position with the tip in the upper right atrium. The catheter flushes and aspirates easily. The catheter was flushed with heparinized saline and secured to the skin with 0 Prolene suture. A sterile bandage was applied. The catheter hubs were capped.  IMPRESSION: Successful placement of a right IJ approach 20 cm triple-lumen non tunneled hemodialysis catheter. The catheter tip is in the upper right atrium and ready for immediate use. Electronically Signed   By: Jacqulynn Cadet M.D.   On: 06/14/2019 18:59   IR US Guide Vasc Access Right  Result Date: 06/21/2019 CLINICAL DATA:  Renal failure, needs durable venous access for hemodialysis until fistula matures EXAM: TUNNELED HEMODIALYSIS CATHETER PLACEMENT WITH ULTRASOUND AND FLUOROSCOPIC GUIDANCE TECHNIQUE: The procedure, risks, benefits, and alternatives were explained to the patient. Questions regarding the procedure were encouraged and answered. The patient understands and consents to the procedure. As antibiotic prophylaxis, cefazolin 2 g was ordered pre-procedure and administered intravenously within one hour of incision.Patency of the right IJ vein was confirmed with ultrasound with image documentation. An appropriate skin site was determined. Region was prepped using maximum barrier technique including cap and mask, sterile gown, sterile gloves, large sterile sheet, and Chlorhexidine as cutaneous antisepsis. The region was infiltrated locally with 1% lidocaine. Intravenous Fentanyl 75mcg and Versed 1mg  were administered as conscious sedation during continuous monitoring of the patient's level of consciousness and physiological / cardiorespiratory status by the radiology RN, with a total moderate sedation time of 10 minutes. Under real-time ultrasound guidance, the right IJ vein was accessed with a 21 gauge micropuncture needle; the needle tip within the vein was confirmed with ultrasound image documentation. Needle exchanged over the 018 guidewire for transitional dilator, which allowed advancement of a Benson wire into the IVC. Over this, an MPA catheter was advanced. A Palindrome 23 hemodialysis catheter was tunneled from the right anterior chest wall approach to the right IJ dermatotomy site. The  MPA catheter was exchanged over an Amplatz wire for serial vascular dilators which allow placement of a peel-away sheath, through which the catheter was advanced under intermittent fluoroscopy, positioned with its tips in the proximal and midright atrium. Spot chest radiograph confirms good catheter position. No pneumothorax. Catheter was flushed and primed per protocol. Catheter secured externally with O Prolene sutures. The right IJ dermatotomy site was closed with Dermabond. COMPLICATIONS: COMPLICATIONS None immediate FLUOROSCOPY TIME:  12 seconds; 11 mGy COMPARISON:  None IMPRESSION: 1. Technically successful placement of tunneled right IJ hemodialysis catheter with ultrasound and fluoroscopic guidance. Ready for routine use. Electronically Signed   By: Lucrezia Europe M.D.   On: 06/21/2019 14:02   IR US Guide Vasc Access Right  Result Date: 06/14/2019 INDICATION: 53 year old male with acute renal failure in need of hemodialysis. He is currently anticoagulated and thrombocytopenic. Therefore, a temporary non tunneled hemodialysis catheter will be placed. EXAM: IR RIGHT FLUORO GUIDE CV LINE; IR ULTRASOUND GUIDANCE VASC ACCESS RIGHT MEDICATIONS: None  ANESTHESIA/SEDATION: None FLUOROSCOPY TIME:  Fluoroscopy Time: 0 minutes 18 seconds (5.3 mGy). COMPLICATIONS: None immediate. PROCEDURE: Informed written consent was obtained from the patient after a thorough discussion of the procedural risks, benefits and alternatives. All questions were addressed. Maximal Sterile Barrier Technique was utilized including caps, mask, sterile gowns, sterile gloves, sterile drape, hand hygiene and skin antiseptic. A timeout was performed prior to the initiation of the procedure. The right internal jugular vein was interrogated with ultrasound and found to be widely patent. An image was obtained and stored for the medical record. Local anesthesia was attained by infiltration with 1% lidocaine. A small dermatotomy was made. Under  real-time sonographic guidance, the vessel was punctured with a 21 gauge micropuncture needle. Using standard technique, the initial micro needle was exchanged over a 0.018 micro wire for a transitional 4 [place] micro sheath. The micro sheath was then exchanged over a 0.035 wire for a fascial dilator and the soft tissue tract was dilated. A 20 cm triple-lumen non tunneled hemodialysis catheter was then advanced over the wire and position with the tip in the upper right atrium. The catheter flushes and aspirates easily. The catheter was flushed with heparinized saline and secured to the skin with 0 Prolene suture. A sterile bandage was applied. The catheter hubs were capped. IMPRESSION: Successful placement of a right IJ approach 20 cm triple-lumen non tunneled hemodialysis catheter. The catheter tip is in the upper right atrium and ready for immediate use. Electronically Signed   By: Jacqulynn Cadet M.D.   On: 06/14/2019 18:59   CT Renal Stone Study  Result Date: 06/11/2019 CLINICAL DATA:  Fall yesterday, rt hip pain. Pt recently discharged from [facility] Multiple UTI since then after foley removed. Here for inability to void, as well as burning upon urination. History of DM, Migraine, seizure, CKD, DVT, PE. Pt unable to hold arms up for abd images due to prior shoulder surgery. EXAM: CT ABDOMEN AND PELVIS WITHOUT CONTRAST TECHNIQUE: Multidetector CT imaging of the abdomen and pelvis was performed following the standard protocol without IV contrast. COMPARISON:  06/04/2019 FINDINGS: Lower chest: There mild hazy opacities at the lung bases as well as linear areas of presumed atelectasis or scarring, similar to the prior CT allowing artifact from overlying arms on the current exam. No new lung base abnormalities. Hepatobiliary: Liver demonstrates central volume loss and relative enlargement of the lateral segment left lobe, findings suggesting cirrhosis. No liver mass or focal lesion. Status post  cholecystectomy. No bile duct dilation Pancreas: Significant fatty replacement. No defined mass or inflammation. Spleen: Enlarged, 20 x 7.8 x 21 cm. This is stable from recent exam. No splenic mass or focal lesion. Adrenals/Urinary Tract: No adrenal mass. Small peripherally calcified cysts reflecting residual left kidney, unchanged. Right kidney shows mild dilation of the intrarenal collecting system. Collecting system contents demonstrate increased attenuation similar to prior CT. There is a mass in the lower pole with peripheral increased attenuation. Mass measures 4.7 x 3.4 x 3.2 cm. The mass corresponds to a complex cyst noted on ultrasound dated 05/11/2019. Findings are consistent hemorrhage into the cyst, which is communicating with the collecting system. No other evidence of mass. No stones. Right ureters normal in course and in caliber. No stones. Bladder is decompressed with a Foley catheter Stomach/Bowel: Stomach is unremarkable. Small bowel and colon are normal in caliber. No wall thickening. No inflammation. Appendix not visualized. No evidence of appendicitis. Vascular/Lymphatic: Mild aortic atherosclerotic calcifications. Enlarged splenic and portal veins. Prominent to mildly  enlarged lymph nodes adjacent to the portal vein, largest measuring 1.4 cm in short axis. No other enlarged lymph nodes. Reproductive: Unremarkable. Other: No ascites.  No abdominal wall hernia. Musculoskeletal: No fracture or acute finding. No osteoblastic or osteolytic lesions. IMPRESSION: 1. Hemorrhage into a right renal lower pole cysts, or dilated calyx, which is communicating with the right intrarenal collecting system. Right renal collecting system is mildly dilated. There is no ureteral dilation, however, no renal or ureteral stones. This appearance is similar to the recent prior CT. 2. Hazy opacities at the lung bases consistent residual COVID-19 infection, also stable from the recent prior abdomen and pelvis CT. 3.  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 4. Marked chronic atrophy of the left kidney with residual peripherally calcified cysts, stable. 5. Mild aortic atherosclerosis. 6. No fracture of the visualized skeletal structures, which includes the right proximal femur to the intertrochanteric region. Electronically Signed   By: Lajean Manes M.D.   On: 06/11/2019 16:59   CT Renal Stone Study  Result Date: 06/04/2019 CLINICAL DATA:  Flank pain. Recurrent urinary tract infections. EXAM: CT ABDOMEN AND PELVIS WITHOUT CONTRAST TECHNIQUE: Multidetector CT imaging of the abdomen and pelvis was performed following the standard protocol without IV contrast. COMPARISON:  12/28/2018 FINDINGS: Lower chest: Increased patchy areas airspace disease are seen in both lower lungs, right side greater than left. No evidence of pleural effusion. Hepatobiliary: No mass visualized on this unenhanced exam. Mild capsular nodularity and left and caudate lobe hypertrophy again seen, highly suspicious for cirrhosis. Prior cholecystectomy. No evidence of biliary obstruction. Pancreas: No mass or inflammatory process visualized on this unenhanced exam. Spleen: Moderate splenomegaly measuring 18-19 cm in length is stable and consistent with portal venous hypertension. Adrenals/Urinary tract: Chronic atrophy of the left kidney with several small peripherally calcified cyst is unchanged in appearance. Compensatory hypertrophy of the right kidney is again seen. Mild increase in hazy opacity is seen in the right perinephric region. Mild right pelvicaliectasis is seen containing high attenuation fluid suspicious for hemorrhage. No evidence of renal or ureteral calculi. Unremarkable unopacified urinary bladder. Stomach/Bowel: No evidence of obstruction, inflammatory process, or abnormal fluid collections. Vascular/Lymphatic: No pathologically enlarged lymph  nodes identified. No evidence of abdominal aortic aneurysm. Aortic atherosclerosis. Reproductive:  No mass or other significant abnormality. Other:  None. Musculoskeletal:  No suspicious bone lesions identified. IMPRESSION: 1. Increased mild right renal pelvicaliectasis and hazy perinephric opacity. No obstructing calculi identified. These findings are nonspecific, but pyelonephritis cannot be excluded. 2. Stable chronic left renal atrophy. 3. Stable findings of hepatic cirrhosis and portal venous hypertension. No evidence of ascites. 4. Increased patchy areas of airspace disease in both lower lungs, right side greater than left, suspicious for atypical infectious or inflammatory process. Aortic Atherosclerosis (ICD10-I70.0). Electronically Signed   By: Marlaine Hind M.D.   On: 06/04/2019 10:13   VAS [place] UPPER EXT VEIN MAPPING (PRE-OP AVF)  Result Date: 06/15/2019 UPPER EXTREMITY VEIN MAPPING  Indications: Pre-access. Limitations: patient body habitus, patient positioning, patient movement Comparison Study: No prior studies. Performing Technologist: Oliver Hum RVT  Examination Guidelines: A complete evaluation includes B-mode imaging, spectral Doppler, color Doppler, and power Doppler as needed of all accessible portions of each vessel. Bilateral testing is considered an integral part of a complete examination. Limited examinations for reoccurring indications may be performed as noted. +-----------------+-------------+----------+---------+ Right Cephalic   Diameter (cm)Depth (cm)Findings  +-----------------+-------------+----------+---------+ Shoulder  0.54        1.41             +-----------------+-------------+----------+---------+ Prox upper arm       0.40        1.47   branching +-----------------+-------------+----------+---------+ Mid upper arm        0.48        0.99             +-----------------+-------------+----------+---------+ Dist upper arm       0.60         0.51             +-----------------+-------------+----------+---------+ Antecubital fossa    0.42        0.41   branching +-----------------+-------------+----------+---------+ Prox forearm         0.21        0.80             +-----------------+-------------+----------+---------+ Mid forearm          0.17        0.75             +-----------------+-------------+----------+---------+ Dist forearm         0.14        0.20             +-----------------+-------------+----------+---------+ +-----------------+-------------+----------+--------------+ Right Basilic    Diameter (cm)Depth (cm)   Findings    +-----------------+-------------+----------+--------------+ Shoulder                                not visualized +-----------------+-------------+----------+--------------+ Prox upper arm                          not visualized +-----------------+-------------+----------+--------------+ Mid upper arm                           not visualized +-----------------+-------------+----------+--------------+ Dist upper arm                          not visualized +-----------------+-------------+----------+--------------+ Antecubital fossa                       not visualized +-----------------+-------------+----------+--------------+ Prox forearm                            not visualized +-----------------+-------------+----------+--------------+ Mid forearm                             not visualized +-----------------+-------------+----------+--------------+ Distal forearm                          not visualized +-----------------+-------------+----------+--------------+ +-----------------+-------------+----------+---------+ Left Cephalic    Diameter (cm)Depth (cm)Findings  +-----------------+-------------+----------+---------+ Shoulder             0.68        3.10             +-----------------+-------------+----------+---------+ Prox upper arm        0.44        1.16             +-----------------+-------------+----------+---------+ Mid upper arm        0.41        0.84             +-----------------+-------------+----------+---------+  Dist upper arm       0.57        0.43             +-----------------+-------------+----------+---------+ Antecubital fossa    0.42        0.46   branching +-----------------+-------------+----------+---------+ Prox forearm         0.34        0.72             +-----------------+-------------+----------+---------+ Mid forearm          0.34        0.76   branching +-----------------+-------------+----------+---------+ Dist forearm         0.33        0.37             +-----------------+-------------+----------+---------+ *See table(s) above for measurements and observations.  Diagnosing physician: Servando Snare MD Electronically signed by Servando Snare MD on 06/15/2019 at 3:37:10 PM.    Final      Time Spent in minutes  30     Desiree Hane M.D on 06/23/2019 at 2:56 PM  To page go to [email or web address] - password [facility]

## 2019-06-23 NOTE — Progress Notes (Signed)
ANTICOAGULATION CONSULT NOTE - Follow-Up Consult  Pharmacy Consult for heparin IV Indication: h/o pulmonary embolus and DVT  Patient Measurements: Height: 6\' 3"  (190.5 cm) Weight: (!) 432 lb 1.6 oz (196 kg) IBW/kg (Calculated) : 84.5 Heparin Dosing Weight: 131.1 kg  Vital Signs: Temp: 98.5 F (36.9 C) (12/25 0441) Temp Source: Oral (12/25 0441) BP: 92/58 (12/25 0441) Pulse Rate: 72 (12/25 0441)  Labs:  Recent Labs    06/19/19 1513 06/20/19 0504 06/20/19 1413 06/21/19 0424 06/21/19 0835 06/22/19 0430 06/22/19 0918  HGB   < > 7.4*  --  9.1* 8.2* 8.5*  --   HCT   < > 23.6*  --  29.8* 26.7* 27.3*  --   PLT   < > 83*  --  80* 86* 85*  --   LABPROT  --  15.5*  --  15.6*  --  15.2  --   INR  --  1.2  --  1.3*  --  1.2  --   HEPARINUNFRC   < > <0.10*  --  0.52  --  0.17* 0.26*  CREATININE  --   --  3.84*  --  4.40* 3.54*  --    < > = values in this interval not displayed.    Estimated Creatinine Clearance: 44.6 mL/min (A) (by C-G formula based on SCr of 3.54 mg/dL (H)).   Assessment: 30 yr ole male with a PMH significant for multiple DVT/PE, on warfarin PTA. Warfarin was held due to gross hematuria on admit (S/P 5 unit RBCs). Heparin bridge started 12/20 - then held 12/21 AM for AVF placement; held again 12/23 AM for a tunneled HD cath. Heparin was resumed 6 hr post procedure.  Heparin level today is 0.38 units/ml, which is within the goal range for this pt. Per RN, no issues with IV or bleeding observed.  INR today remains subtherapeutic (INR 1.3, goal of 2-3). H/H and plt low, but stable (has chronic thrombocytopenia).  CBC low but stable/improving   Goal of Therapy:  Heparin level 0.3-0.5 units/ml Monitor platelets by anticoagulation protocol: Yes   Plan:  Continue heparin at 2700 units/hr Check 6-hr heparin level Warfarin 7.5 mg x 1  Monitor daily PT/INR, CBC, heparin level Monitor for signs/symptoms of bleeding   Acey Lav, PharmD  PGY1 [place] Resident [phone] 06/22/19, 19:56 PM

## 2019-06-23 NOTE — Progress Notes (Signed)
[facility] NEPHROLOGY PROGRESS NOTE  Assessment/ Plan: Pt is a 53 y.o. yo male with morbid obesity, OSA, pulmonary embolus, seizure, DM, HTN, RA, bipolar, CKD 4/5 follows at [place], multiple recent hospitalization for CIVID-PNA, urosepsis, AKI admitted with decreased urine output, gross hematuria and worsening renal failure.  [facility] for HD.  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  He was oliguric and uremic.  Status post right IJ non-tunnel catheter placed by IR -  convert [facility] 12/23,  Status post left brachiocephalic AV fistula creation by Dr. Scot Dock on 12/21. First HD on 12/17, tolerated 3 treatments with significant clinical improvement.  4th tx 12/23. Needs to have enough mobility to get into HD chair to dialyze OP- lives in [place].  Next HD here will  be on Saturday (holiday sched)   #Gross hematuria CT scan with hemorrhagic right kidney cyst.  He has Foley catheter.  Seen by urology,  conservative management.  #Anemia due to CKD and blood loss: Iron saturation 16%.  Continue IV iron and Aranesp during HD- inc dose.  Received PRBC this hosp.  #CKD-MBD: Phosphorus level elevated. PTH  86.  Continue phoslo .  Lab expect to improve with HD. PTH 86- no meds  #Acute encephalopathy, due to medication and uremia:  improved.  #Morbid obesity  Subjective: No new complaints -  Making a good amount of urine   Objective Vital signs in last 24 hours: Vitals:   06/22/19 1105 06/22/19 1644 06/22/19 2146 06/23/19 0441  BP: (!) 115/47 (!) 115/54 (!) 115/55 (!) 92/58  Pulse: 75 73 71 72  Resp: 16 18 16 16   Temp:  99.8 F (37.7 C) 98.6 F (37 C) 98.5 F (36.9 C)  TempSrc:  Oral Oral Oral  SpO2: 95% 95% 96% 94%  Weight:      Height:       Weight change:   Intake/Output Summary (Last 24 hours) at 06/23/2019 0926 Last data filed at 06/23/2019 0300 Gross per 24 hour  Intake 1204.01  ml  Output 625 ml  Net 579.01 ml       Labs: Basic Metabolic Panel: Recent Labs  Lab 06/19/19 1513 06/20/19 1413 06/21/19 0835 06/22/19 0430  NA 140 136 138 137  K 4.9 4.0 4.1 3.9  CL 104 99 99 98  CO2 27 29 30 29   GLUCOSE 150* 159* 152* 142*  BUN 42* 30* 43* 29*  CREATININE 6.21* 3.84* 4.40* 3.54*  CALCIUM 8.7* 8.6* 8.7* 8.8*  PHOS 5.5* 4.3 6.3*  --    Liver Function Tests: Recent Labs  Lab 06/19/19 1513 06/20/19 1413 06/21/19 0835  ALBUMIN 2.5* 2.5* 2.4*   No results for input(s): LIPASE, AMYLASE in the last 168 hours. No results for input(s): AMMONIA in the last 168 hours. CBC: Recent Labs  Lab 06/20/19 0504 06/21/19 0424 06/21/19 0835 06/22/19 0430 06/23/19 0424  WBC 3.1* 4.2 3.4* 4.2 4.0  HGB 7.4* 9.1* 8.2* 8.5* 8.2*  HCT 23.6* 29.8* 26.7* 27.3* 25.5*  MCV 98.7 100.3* 99.6 98.2 97.7  PLT 83* 80* 86* 85* 81*   Cardiac Enzymes: No results for input(s): CKTOTAL, CKMB, CKMBINDEX, TROPONINI in the last 168 hours. CBG: Recent Labs  Lab 06/22/19 0656 06/22/19 1140 06/22/19 1711 06/22/19 2148 06/23/19 0705  GLUCAP 136* 151* 175* 196* 166*    Iron Studies: No results for input(s): IRON, TIBC, TRANSFERRIN, FERRITIN in the last 72 hours. Studies/Results: IR Fluoro  Guide CV Line Right  Result Date: 06/21/2019 CLINICAL DATA:  Renal failure, needs durable venous access for hemodialysis until fistula matures EXAM: TUNNELED HEMODIALYSIS CATHETER PLACEMENT WITH ULTRASOUND AND FLUOROSCOPIC GUIDANCE TECHNIQUE: The procedure, risks, benefits, and alternatives were explained to the patient. Questions regarding the procedure were encouraged and answered. The patient understands and consents to the procedure. As antibiotic prophylaxis, cefazolin 2 g was ordered pre-procedure and administered intravenously within one hour of incision.Patency of the right IJ vein was confirmed with ultrasound with image documentation. An appropriate skin site was determined. Region was  prepped using maximum barrier technique including cap and mask, sterile gown, sterile gloves, large sterile sheet, and Chlorhexidine as cutaneous antisepsis. The region was infiltrated locally with 1% lidocaine. Intravenous Fentanyl 67mcg and Versed 1mg  were administered as conscious sedation during continuous monitoring of the patient's level of consciousness and physiological / cardiorespiratory status by the radiology RN, with a total moderate sedation time of 10 minutes. Under real-time ultrasound guidance, the right IJ vein was accessed with a 21 gauge micropuncture needle; the needle tip within the vein was confirmed with ultrasound image documentation. Needle exchanged over the 018 guidewire for transitional dilator, which allowed advancement of a Benson wire into the IVC. Over this, an MPA catheter was advanced. A Palindrome 23 hemodialysis catheter was tunneled from the right anterior chest wall approach to the right IJ dermatotomy site. The MPA catheter was exchanged over an Amplatz wire for serial vascular dilators which allow placement of a peel-away sheath, through which the catheter was advanced under intermittent fluoroscopy, positioned with its tips in the proximal and midright atrium. Spot chest radiograph confirms good catheter position. No pneumothorax. Catheter was flushed and primed per protocol. Catheter secured externally with O Prolene sutures. The right IJ dermatotomy site was closed with Dermabond. COMPLICATIONS: COMPLICATIONS None immediate FLUOROSCOPY TIME:  12 seconds; 11 mGy COMPARISON:  None IMPRESSION: 1. Technically successful placement of tunneled right IJ hemodialysis catheter with ultrasound and fluoroscopic guidance. Ready for routine use. Electronically Signed   By: Lucrezia Europe M.D.   On: 06/21/2019 14:02   IR US Guide Vasc Access Right  Result Date: 06/21/2019 CLINICAL DATA:  Renal failure, needs durable venous access for hemodialysis until fistula matures EXAM: TUNNELED  HEMODIALYSIS CATHETER PLACEMENT WITH ULTRASOUND AND FLUOROSCOPIC GUIDANCE TECHNIQUE: The procedure, risks, benefits, and alternatives were explained to the patient. Questions regarding the procedure were encouraged and answered. The patient understands and consents to the procedure. As antibiotic prophylaxis, cefazolin 2 g was ordered pre-procedure and administered intravenously within one hour of incision.Patency of the right IJ vein was confirmed with ultrasound with image documentation. An appropriate skin site was determined. Region was prepped using maximum barrier technique including cap and mask, sterile gown, sterile gloves, large sterile sheet, and Chlorhexidine as cutaneous antisepsis. The region was infiltrated locally with 1% lidocaine. Intravenous Fentanyl 22mcg and Versed 1mg  were administered as conscious sedation during continuous monitoring of the patient's level of consciousness and physiological / cardiorespiratory status by the radiology RN, with a total moderate sedation time of 10 minutes. Under real-time ultrasound guidance, the right IJ vein was accessed with a 21 gauge micropuncture needle; the needle tip within the vein was confirmed with ultrasound image documentation. Needle exchanged over the 018 guidewire for transitional dilator, which allowed advancement of a Benson wire into the IVC. Over this, an MPA catheter was advanced. A Palindrome 23 hemodialysis catheter was tunneled from the right anterior chest wall approach to the right IJ dermatotomy  site. The MPA catheter was exchanged over an Amplatz wire for serial vascular dilators which allow placement of a peel-away sheath, through which the catheter was advanced under intermittent fluoroscopy, positioned with its tips in the proximal and midright atrium. Spot chest radiograph confirms good catheter position. No pneumothorax. Catheter was flushed and primed per protocol. Catheter secured externally with O Prolene sutures. The right  IJ dermatotomy site was closed with Dermabond. COMPLICATIONS: COMPLICATIONS None immediate FLUOROSCOPY TIME:  12 seconds; 11 mGy COMPARISON:  None IMPRESSION: 1. Technically successful placement of tunneled right IJ hemodialysis catheter with ultrasound and fluoroscopic guidance. Ready for routine use. Electronically Signed   By: Lucrezia Europe M.D.   On: 06/21/2019 14:02    Medications: Infusions: . sodium chloride 250 mL (06/15/19 1130)  . sodium chloride    . ferric gluconate (FERRLECIT/NULECIT) IV 125 mg (06/22/19 1223)  . heparin 2,700 Units/hr (06/23/19 0048)    Scheduled Medications: . busPIRone  15 mg Oral BID  . calcium acetate  1,334 mg Oral TID WC  . Chlorhexidine Gluconate Cloth  6 each Topical Q0600  . Chlorhexidine Gluconate Cloth  6 each Topical Q0600  . [START ON 06/24/2019] darbepoetin (ARANESP) injection - DIALYSIS  150 mcg Intravenous Q Sat-HD  . DULoxetine  60 mg Oral Daily  . fentaNYL  1 patch Transdermal Q3 days  . finasteride  5 mg Oral Daily  . insulin aspart  0-9 Units Subcutaneous TID WC  . insulin glargine  45 Units Subcutaneous BID  . lamoTRIgine  200 mg Oral BID  . levETIRAcetam  500 mg Oral BID  . pregabalin  50 mg Oral TID  . simvastatin  20 mg Oral QHS  . tamsulosin  0.4 mg Oral Daily  . traZODone  50 mg Oral QHS  . warfarin  7.5 mg Oral ONCE-1800  . Warfarin - Pharmacist Dosing Inpatient   Does not apply q1800    have reviewed scheduled and prn medications.  Physical Exam: General: Morbidly obese male lying on bed comfortable, not in distress Heart:RRR, s1s2 nl, no rub Lungs: Distant breath sound, no crackle appreciated Abdomen:soft, Non-tender, non-distended Extremities: Bilateral lower extremity edema, improving Neurology: More alert awake and pleasant. Dialysis Access: Right  tunneled placed by IR on 12/23.  Left AV fistula placed 12/21 has good thrill and bruit, wound looks good.  Lezlee Gills A Nhyla Nappi 06/23/2019,9:26 AM  LOS: 12 days

## 2019-06-24 LAB — RENAL FUNCTION PANEL
Albumin: 2.4 g/dL — ABNORMAL LOW (ref 3.5–5.0)
Anion gap: 9 (ref 5–15)
BUN: 63 mg/dL — ABNORMAL HIGH (ref 6–20)
CO2: 26 mmol/L (ref 22–32)
Calcium: 8.5 mg/dL — ABNORMAL LOW (ref 8.9–10.3)
Chloride: 98 mmol/L (ref 98–111)
Creatinine, Ser: 5.13 mg/dL — ABNORMAL HIGH (ref 0.61–1.24)
GFR calc Af Amer: 14 mL/min — ABNORMAL LOW (ref >60)
GFR calc non Af Amer: 12 mL/min — ABNORMAL LOW (ref >60)
Glucose, Bld: 203 mg/dL — ABNORMAL HIGH (ref 70–99)
Phosphorus: 5.4 mg/dL — ABNORMAL HIGH (ref 2.5–4.6)
Potassium: 4 mmol/L (ref 3.5–5.1)
Sodium: 133 mmol/L — ABNORMAL LOW (ref 135–145)

## 2019-06-24 LAB — CBC
HCT: 25.7 % — ABNORMAL LOW (ref 39.0–52.0)
Hemoglobin: 8 g/dL — ABNORMAL LOW (ref 13.0–17.0)
MCH: 31 pg (ref 26.0–34.0)
MCHC: 31.1 g/dL (ref 30.0–36.0)
MCV: 99.6 fL (ref 80.0–100.0)
Platelets: 80 10*3/uL — ABNORMAL LOW (ref 150–400)
RBC: 2.58 MIL/uL — ABNORMAL LOW (ref 4.22–5.81)
RDW: 14.1 % (ref 11.5–15.5)
WBC: 3.4 10*3/uL — ABNORMAL LOW (ref 4.0–10.5)
nRBC: 0 % (ref 0.0–0.2)

## 2019-06-24 LAB — PROTIME-INR
INR: 1.4 — ABNORMAL HIGH (ref 0.8–1.2)
Prothrombin Time: 17.1 seconds — ABNORMAL HIGH (ref 11.4–15.2)

## 2019-06-24 LAB — GLUCOSE, CAPILLARY
Glucose-Capillary: 181 mg/dL — ABNORMAL HIGH (ref 70–99)
Glucose-Capillary: 130 mg/dL — ABNORMAL HIGH (ref 70–99)
Glucose-Capillary: 195 mg/dL — ABNORMAL HIGH (ref 70–99)
Glucose-Capillary: 173 mg/dL — ABNORMAL HIGH (ref 70–99)

## 2019-06-24 LAB — HEPARIN LEVEL (UNFRACTIONATED): Heparin Unfractionated: 0.32 IU/mL (ref 0.30–0.70)

## 2019-06-24 MED ORDER — DARBEPOETIN ALFA 150 MCG/0.3ML IJ SOSY
PREFILLED_SYRINGE | INTRAMUSCULAR | Status: AC
  Administered 2019-06-24: 150 ug via INTRAVENOUS
  Filled 2019-06-24: qty 0.3

## 2019-06-24 MED ORDER — HEPARIN SODIUM (PORCINE) 1000 UNIT/ML IJ SOLN
INTRAMUSCULAR | Status: AC
  Administered 2019-06-24: 3800 [IU] via ARTERIOVENOUS_FISTULA
  Filled 2019-06-24: qty 4

## 2019-06-24 MED ORDER — WARFARIN SODIUM 7.5 MG PO TABS
7.5000 mg | ORAL_TABLET | Freq: Once | ORAL | Status: AC
  Administered 2019-06-24: 7.5 mg via ORAL
  Filled 2019-06-24: qty 1

## 2019-06-24 NOTE — Progress Notes (Signed)
[facility]  PROGRESS NOTE  [name] [id number] DOB: 26-Aug-1965 DOA: 06/11/2019 PCP: Sharion Balloon, FNP Admit date - 06/11/2019   Admitting Physician Bethena Roys, MD  Outpatient Primary MD for the patient is Sharion Balloon, FNP  LOS - 1 Brief Narrative   [name] is a 53 y.o. year old male with medical history significant for CKD stage V, chronic respiratory failure on 3 L O2, OSA/OHS, diabetes mellitus, pulmonary embolism DVT, bipolar disorder, prior Covid pneumonia (admitted from 10/11-10/27) multiple hospitalizations with most recent 12/6-12/10 for UTI with gross hematuria complicated by pyelonephritis and acute on chronic CKD for which patient was discharged on Augmentin, Flomax with renal follow-up but presented again on 06/11/2019 to [facility] with reports of inability to void and was found to have a creatinine of 9.42 (previous baseline of 5.4, 12/10).  Was admitted with working diagnosis of AKI on CKD stage V.  Patient was transferred to [facility] on 12/15 to initiate HD.    Of note, patient has had several hospitalizations including: 12/6-12/10: UTI/pyelonephritis with acute on chronic kidney disease, urine culture growing E faecalis, discharged on Augmentin from  11/10-11/16 AKI on CKD stage V secondary to ischemic ATN related to hypotension from sepsis secondary to E faecalis UTI, pulmonary edema versus infiltrate to sequelae of his recent COVID-19 infection  10/11-10/27 acute on chronic respiratory failure secondary to pneumonia due to COVID-19 virus infection for which he did not require intubation treated with remdesivir or steroids.  Significant events: 12/13 admitted to [facility] 12/15 transferred to [facility]. 12/16 TDC insertion by IR 12/17 initiation hemodialysis 12/21 L AV fistula    Subjective  Reports bleeding at hd cath site And some nose bleeding yesterday No active bleed today Still no  hematuria  A & P   AKI on CKD stage V, now progressed to ESRD. Patient was oliguric and uremic on presentation. Patient is tolerating dialysis (started on 12/17) via tunnel catheter (placed by IR on 12/23) with normal mentation Status left post brachiocephalic AV fistula by Dr. Doren Custard on 12/21 -Next HD session on 12/26 per nephro -Working on mobility to get into HD chair, which is necessary for outpatient dialysis, continue PT therapy -Plan for outpatient HD set up at [facility]  Gross hematuria and acute blood loss anemia, resolved Hemorrhagic right kidney cyst on CT scan.  Urology consulted (12/17), given gross hematuria was improving no urgent need for cystoscopy given patient is a poor candidate for embolization versus nephrectomy. 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  Previously was holding anticoagulation and requiring multiple blood transfusions, has not had recurrent episodes of hematuria and is tolerating heparin drip while awaiting therapeutic INR Did have some bleeding at HD site on 12/25 and nose bleed, INR still subtherapeutic and heparin level at lower end of normal -Warfarin protocol per pharmacy while on heparin bridge--closely monitor if bleeding from HD site becomes unmanageable/significant --Daily INR  Chronic anemia due to CKD with thrombocytopenia, stable Status post 5 units blood transfusions during hospital course.  Hemoglobin and platelets stable.  No active signs or symptoms of bleeding currently -Monitor CBC  Acute metabolic encephalopathy, resolved Likely multifactorial including uremia, as well as medication induced due to multiple narcotics in the setting of worsening kidney dysfunction and poor  clearance. Improved while holding home buspar, cymbalta, lyrica,  narcotics and HD Currently alert and oriented x4 -Close monitoring of mental status -Continue his home psychotropic medications  Chronic respiratory failure on home 3-4 L O2, stable OSA and obesity hypoventilation syndrome Morbid obesity -Continue supplemental O2, wean O2 as able, currently on 4 L -Does not use CPAP  Mood disorder/bipolar disorder -BuSpar, duloxetine, Lamictal, Keppra and trazodone  Seizure history -Continue home Keppra/Lamictal  Chronic pain syndrome -Continue fentanyl patch, tolerating well now that on HD  Type 2 diabetes with hyperlipidemia A1c 7.9 (03/2019) CBGs at goal -Continue Lantus 45 units twice daily (home regimen Lantus 60 units twice daily), sliding scale insulin with meals -Continue simvastatin  Debility Patient is wheelchair-bound at baseline Further worsened by current hospitalization Need to work on mobility so able to tolerate outpatient HD with PT hre    Family Communication  : updated wife at bedside  Code Status : Full code  Disposition Plan  : continued HD, need to increase mobilization to be able to withstand HD as outpatient safely, heparin until INR therapeutic  Consults  : Nephrology, IR, urology  Procedures  :  Status left post brachiocephalic AV fistula by Dr. Doren Custard on 12/21 Tunneled dialysis catheter by IR on 12/23  DVT Prophylaxis  : Heparin gtt bridge to warfarin  Lab Results  Component Value Date   PLT 80 (L) 06/24/2019    Diet :  Diet Order            Diet heart healthy/carb modified Room service appropriate? Yes; Fluid consistency: Thin  Diet effective now               Inpatient Medications Scheduled Meds: . busPIRone  15 mg Oral BID  . calcium acetate  1,334 mg Oral TID WC  . Chlorhexidine Gluconate Cloth  6 each Topical Q0600  . Chlorhexidine Gluconate Cloth  6 each Topical Q0600  . darbepoetin (ARANESP) injection - DIALYSIS  150 mcg Intravenous Q Sat-HD  . DULoxetine  60 mg Oral Daily  .  fentaNYL  1 patch Transdermal Q3 days  . finasteride  5 mg Oral Daily  . insulin aspart  0-9 Units Subcutaneous TID WC  . insulin glargine  45 Units Subcutaneous BID  . lamoTRIgine  200 mg Oral BID  . levETIRAcetam  500 mg Oral BID  . pregabalin  50 mg Oral TID  . simvastatin  20 mg Oral QHS  . tamsulosin  0.4 mg Oral Daily  . traZODone  50 mg Oral QHS  . Warfarin - Pharmacist Dosing Inpatient   Does not apply q1800   Continuous Infusions: . sodium chloride 250 mL (06/15/19 1130)  . sodium chloride    . heparin 2,700 Units/hr (06/24/19 1350)   PRN Meds:.sodium chloride, acetaminophen **OR** acetaminophen, albuterol, diphenhydrAMINE, fluticasone, HYDROcodone-acetaminophen, ondansetron **OR** ondansetron (ZOFRAN) IV, polyethylene glycol, sodium chloride  Antibiotics  :   Anti-infectives (From admission, onward)   Start     Dose/Rate Route Frequency Ordered Stop   06/21/19 1257  ceFAZolin (ANCEF) 2-4 GM/100ML-% IVPB    Note to Pharmacy: Rudene Re   : cabinet override      06/21/19 1257 06/21/19 1611   06/21/19 0000  ceFAZolin (ANCEF) IVPB 2g/100 mL premix     2 g 200 mL/hr over 30 Minutes Intravenous To Radiology 06/20/19 1348 06/21/19 1337   06/19/19 0600  cefUROXime (ZINACEF) 1.5 g in sodium chloride 0.9 % 100 mL IVPB     1.5 g 200  mL/hr over 30 Minutes Intravenous On call to O.R. 06/18/19 0959 06/19/19 1145   06/11/19 2200  amoxicillin-clavulanate (AUGMENTIN) 500-125 MG per tablet 500 mg  Status:  Discontinued     1 tablet Oral 2 times daily 06/11/19 2114 06/14/19 0135       Objective   Vitals:   06/24/19 1200 06/24/19 1230 06/24/19 1253 06/24/19 1955  BP: (!) 110/56 (!) 112/45 (!) 111/51 (!) 102/46  Pulse: 66 66 68 66  Resp: 14 16 16 18   Temp:   97.9 F (36.6 C) 98.4 F (36.9 C)  TempSrc:   Oral Oral  SpO2:   98% 98%  Weight:      Height:        SpO2: 98 % O2 Flow Rate (L/min): 3 L/min FiO2 (%): (!) 4 %  Wt Readings from Last 3 Encounters:  06/20/19 (!)  196 kg  06/04/19 (!) 197.8 kg  05/11/19 (!) 196.2 kg     Intake/Output Summary (Last 24 hours) at 06/24/2019 2221 Last data filed at 06/24/2019 1841 Gross per 24 hour  Intake 1192.72 ml  Output 4200 ml  Net -3007.28 ml    Physical Exam:  Awake Alert, Oriented X 3, in bed, no distress, normal affect No new F.N deficits,  Symmetrical Chest wall movement, Good air movement bilaterally difficult to appreciate breath sounds due to body habitus on 4 L nasal cannula with no respiratory distress RRR,No Gallops,Rubs or new Murmurs,  +ve B.Sounds, Abd Soft, No tenderness,  No rebound, guarding or rigidity. Dry blood under dressing for HD cath site. No active bleeding No peripheral edema   I have personally reviewed the following:   Data Reviewed:  CBC Recent Labs  Lab 06/21/19 0424 06/21/19 0835 06/22/19 0430 06/23/19 0424 06/24/19 0443  WBC 4.2 3.4* 4.2 4.0 3.4*  HGB 9.1* 8.2* 8.5* 8.2* 8.0*  HCT 29.8* 26.7* 27.3* 25.5* 25.7*  PLT 80* 86* 85* 81* 80*  MCV 100.3* 99.6 98.2 97.7 99.6  MCH 30.6 30.6 30.6 31.4 31.0  MCHC 30.5 30.7 31.1 32.2 31.1  RDW 14.7 14.6 14.4 14.1 14.1    Chemistries  Recent Labs  Lab 06/19/19 1513 06/20/19 1413 06/21/19 0835 06/22/19 0430 06/24/19 0912  NA 140 136 138 137 133*  K 4.9 4.0 4.1 3.9 4.0  CL 104 99 99 98 98  CO2 27 29 30 29 26   GLUCOSE 150* 159* 152* 142* 203*  BUN 42* 30* 43* 29* 63*  CREATININE 6.21* 3.84* 4.40* 3.54* 5.13*  CALCIUM 8.7* 8.6* 8.7* 8.8* 8.5*   ------------------------------------------------------------------------------------------------------------------ No results for input(s): CHOL, HDL, LDLCALC, TRIG, CHOLHDL, LDLDIRECT in the last 72 hours.  Lab Results  Component Value Date   HGBA1C 7.9 (H) 04/10/2019   ------------------------------------------------------------------------------------------------------------------ No results for input(s): TSH, T4TOTAL, T3FREE, THYROIDAB in the last 72  hours.  Invalid input(s): FREET3 ------------------------------------------------------------------------------------------------------------------ No results for input(s): VITAMINB12, FOLATE, FERRITIN, TIBC, IRON, RETICCTPCT in the last 72 hours.  Coagulation profile Recent Labs  Lab 06/20/19 0504 06/21/19 0424 06/22/19 0430 06/23/19 0424 06/24/19 0443  INR 1.2 1.3* 1.2 1.3* 1.4*    No results for input(s): DDIMER in the last 72 hours.  Cardiac Enzymes No results for input(s): CKMB, TROPONINI, MYOGLOBIN in the last 168 hours.  Invalid input(s): CK ------------------------------------------------------------------------------------------------------------------    Component Value Date/Time   BNP 137.9 (H) 04/24/2019 0145    Micro Results Recent Results (from the past 240 hour(s))  MRSA PCR Screening     Status: None   Collection Time: 06/15/19  4:40 AM   Specimen: Nasal Mucosa; Nasopharyngeal  Result Value Ref Range Status   MRSA by PCR NEGATIVE NEGATIVE Final    Comment:        The GeneXpert MRSA Assay (FDA approved for NASAL specimens only), is one component of a comprehensive MRSA colonization surveillance program. It is not intended to diagnose MRSA infection nor to guide or monitor treatment for MRSA infections. Performed at [facility], [place] [address] [place], [place] [address]     Radiology Reports DG Knee 1-2 Views Right  Result Date: 06/12/2019 CLINICAL DATA:  Right knee trauma. EXAM: RIGHT KNEE - 1-2 VIEW COMPARISON:  None. FINDINGS: There is extensive prepatellar soft tissue swelling. There is a moderate-sized joint effusion. There are end-stage degenerative changes of the patellofemoral compartment. There are advanced degenerative changes of the medial and lateral compartments. Chondrocalcinosis is noted. There is no definite acute displaced fracture or dislocation. IMPRESSION: 1. Extensive prepatellar soft tissue swelling and moderate-sized  joint effusion. 2. No definite acute displaced fracture or dislocation. If there is high clinical suspicion for an occult fracture, follow-up with CT is recommended. 3. End-stage degenerative changes of the patellofemoral compartment. 4. Chondrocalcinosis. Electronically Signed   By: Constance Holster M.D.   On: 06/12/2019 19:09   CT Head Wo Contrast  Result Date: 06/11/2019 CLINICAL DATA:  Recent fall with headaches EXAM: CT HEAD WITHOUT CONTRAST CT CERVICAL SPINE WITHOUT CONTRAST TECHNIQUE: Multidetector CT imaging of the head and cervical spine was performed following the standard protocol without intravenous contrast. Multiplanar CT image reconstructions of the cervical spine were also generated. COMPARISON:  None. FINDINGS: CT HEAD FINDINGS Brain: No evidence of acute infarction, hemorrhage, hydrocephalus, extra-axial collection or mass lesion/mass effect. Vascular: No hyperdense vessel or unexpected calcification. Skull: Normal. Negative for fracture or focal lesion. Sinuses/Orbits: No acute finding. Other: None. CT CERVICAL SPINE FINDINGS Alignment: Alignment is within normal limits although severe artifact is noted below the C5 vertebral body. The posterior elements of C6 and C7 are well visualized. Skull base and vertebrae: 7 cervical segments are visualized although the vertebral body at C6 and C7 is incompletely evaluated due to significant artifact. No acute fracture or acute facet abnormality is noted. Soft tissues and spinal canal: Surrounding soft tissue structures are within normal limits. Upper chest: Mild interstitial changes are seen. Other: None IMPRESSION: CT of the head: No acute intracranial abnormality noted. CT of the cervical spine: Significantly limited exam secondary to the patient's body habitus and inability to adequately position himself. No acute abnormality is noted. Electronically Signed   By: Inez Catalina M.D.   On: 06/11/2019 16:34   CT Cervical Spine Wo Contrast  Result  Date: 06/11/2019 CLINICAL DATA:  Recent fall with headaches EXAM: CT HEAD WITHOUT CONTRAST CT CERVICAL SPINE WITHOUT CONTRAST TECHNIQUE: Multidetector CT imaging of the head and cervical spine was performed following the standard protocol without intravenous contrast. Multiplanar CT image reconstructions of the cervical spine were also generated. COMPARISON:  None. FINDINGS: CT HEAD FINDINGS Brain: No evidence of acute infarction, hemorrhage, hydrocephalus, extra-axial collection or mass lesion/mass effect. Vascular: No hyperdense vessel or unexpected calcification. Skull: Normal. Negative for fracture or focal lesion. Sinuses/Orbits: No acute finding. Other: None. CT CERVICAL SPINE FINDINGS Alignment: Alignment is within normal limits although severe artifact is noted below the C5 vertebral body. The posterior elements of C6 and C7 are well visualized. Skull base and vertebrae: 7 cervical segments are visualized although the vertebral body at C6 and C7 is  incompletely evaluated due to significant artifact. No acute fracture or acute facet abnormality is noted. Soft tissues and spinal canal: Surrounding soft tissue structures are within normal limits. Upper chest: Mild interstitial changes are seen. Other: None IMPRESSION: CT of the head: No acute intracranial abnormality noted. CT of the cervical spine: Significantly limited exam secondary to the patient's body habitus and inability to adequately position himself. No acute abnormality is noted. Electronically Signed   By: Inez Catalina M.D.   On: 06/11/2019 16:34   IR Fluoro Guide CV Line Right  Result Date: 06/21/2019 CLINICAL DATA:  Renal failure, needs durable venous access for hemodialysis until fistula matures EXAM: TUNNELED HEMODIALYSIS CATHETER PLACEMENT WITH ULTRASOUND AND FLUOROSCOPIC GUIDANCE TECHNIQUE: The procedure, risks, benefits, and alternatives were explained to the patient. Questions regarding the procedure were encouraged and answered. The  patient understands and consents to the procedure. As antibiotic prophylaxis, cefazolin 2 g was ordered pre-procedure and administered intravenously within one hour of incision.Patency of the right IJ vein was confirmed with ultrasound with image documentation. An appropriate skin site was determined. Region was prepped using maximum barrier technique including cap and mask, sterile gown, sterile gloves, large sterile sheet, and Chlorhexidine as cutaneous antisepsis. The region was infiltrated locally with 1% lidocaine. Intravenous Fentanyl 68mcg and Versed 1mg  were administered as conscious sedation during continuous monitoring of the patient's level of consciousness and physiological / cardiorespiratory status by the radiology RN, with a total moderate sedation time of 10 minutes. Under real-time ultrasound guidance, the right IJ vein was accessed with a 21 gauge micropuncture needle; the needle tip within the vein was confirmed with ultrasound image documentation. Needle exchanged over the 018 guidewire for transitional dilator, which allowed advancement of a Benson wire into the IVC. Over this, an MPA catheter was advanced. A Palindrome 23 hemodialysis catheter was tunneled from the right anterior chest wall approach to the right IJ dermatotomy site. The MPA catheter was exchanged over an Amplatz wire for serial vascular dilators which allow placement of a peel-away sheath, through which the catheter was advanced under intermittent fluoroscopy, positioned with its tips in the proximal and midright atrium. Spot chest radiograph confirms good catheter position. No pneumothorax. Catheter was flushed and primed per protocol. Catheter secured externally with O Prolene sutures. The right IJ dermatotomy site was closed with Dermabond. COMPLICATIONS: COMPLICATIONS None immediate FLUOROSCOPY TIME:  12 seconds; 11 mGy COMPARISON:  None IMPRESSION: 1. Technically successful placement of tunneled right IJ hemodialysis  catheter with ultrasound and fluoroscopic guidance. Ready for routine use. Electronically Signed   By: Lucrezia Europe M.D.   On: 06/21/2019 14:02   IR Fluoro Guide CV Line Right  Result Date: 06/14/2019 INDICATION: 53 year old male with acute renal failure in need of hemodialysis. He is currently anticoagulated and thrombocytopenic. Therefore, a temporary non tunneled hemodialysis catheter will be placed. EXAM: IR RIGHT FLUORO GUIDE CV LINE; IR ULTRASOUND GUIDANCE VASC ACCESS RIGHT MEDICATIONS: None ANESTHESIA/SEDATION: None FLUOROSCOPY TIME:  Fluoroscopy Time: 0 minutes 18 seconds (5.3 mGy). COMPLICATIONS: None immediate. PROCEDURE: Informed written consent was obtained from the patient after a thorough discussion of the procedural risks, benefits and alternatives. All questions were addressed. Maximal Sterile Barrier Technique was utilized including caps, mask, sterile gowns, sterile gloves, sterile drape, hand hygiene and skin antiseptic. A timeout was performed prior to the initiation of the procedure. The right internal jugular vein was interrogated with ultrasound and found to be widely patent. An image was obtained and stored for the medical record. Local  anesthesia was attained by infiltration with 1% lidocaine. A small dermatotomy was made. Under real-time sonographic guidance, the vessel was punctured with a 21 gauge micropuncture needle. Using standard technique, the initial micro needle was exchanged over a 0.018 micro wire for a transitional 4 [place] micro sheath. The micro sheath was then exchanged over a 0.035 wire for a fascial dilator and the soft tissue tract was dilated. A 20 cm triple-lumen non tunneled hemodialysis catheter was then advanced over the wire and position with the tip in the upper right atrium. The catheter flushes and aspirates easily. The catheter was flushed with heparinized saline and secured to the skin with 0 Prolene suture. A sterile bandage was applied. The catheter hubs  were capped. IMPRESSION: Successful placement of a right IJ approach 20 cm triple-lumen non tunneled hemodialysis catheter. The catheter tip is in the upper right atrium and ready for immediate use. Electronically Signed   By: Jacqulynn Cadet M.D.   On: 06/14/2019 18:59   IR US Guide Vasc Access Right  Result Date: 06/21/2019 CLINICAL DATA:  Renal failure, needs durable venous access for hemodialysis until fistula matures EXAM: TUNNELED HEMODIALYSIS CATHETER PLACEMENT WITH ULTRASOUND AND FLUOROSCOPIC GUIDANCE TECHNIQUE: The procedure, risks, benefits, and alternatives were explained to the patient. Questions regarding the procedure were encouraged and answered. The patient understands and consents to the procedure. As antibiotic prophylaxis, cefazolin 2 g was ordered pre-procedure and administered intravenously within one hour of incision.Patency of the right IJ vein was confirmed with ultrasound with image documentation. An appropriate skin site was determined. Region was prepped using maximum barrier technique including cap and mask, sterile gown, sterile gloves, large sterile sheet, and Chlorhexidine as cutaneous antisepsis. The region was infiltrated locally with 1% lidocaine. Intravenous Fentanyl 14mcg and Versed 1mg  were administered as conscious sedation during continuous monitoring of the patient's level of consciousness and physiological / cardiorespiratory status by the radiology RN, with a total moderate sedation time of 10 minutes. Under real-time ultrasound guidance, the right IJ vein was accessed with a 21 gauge micropuncture needle; the needle tip within the vein was confirmed with ultrasound image documentation. Needle exchanged over the 018 guidewire for transitional dilator, which allowed advancement of a Benson wire into the IVC. Over this, an MPA catheter was advanced. A Palindrome 23 hemodialysis catheter was tunneled from the right anterior chest wall approach to the right IJ dermatotomy  site. The MPA catheter was exchanged over an Amplatz wire for serial vascular dilators which allow placement of a peel-away sheath, through which the catheter was advanced under intermittent fluoroscopy, positioned with its tips in the proximal and midright atrium. Spot chest radiograph confirms good catheter position. No pneumothorax. Catheter was flushed and primed per protocol. Catheter secured externally with O Prolene sutures. The right IJ dermatotomy site was closed with Dermabond. COMPLICATIONS: COMPLICATIONS None immediate FLUOROSCOPY TIME:  12 seconds; 11 mGy COMPARISON:  None IMPRESSION: 1. Technically successful placement of tunneled right IJ hemodialysis catheter with ultrasound and fluoroscopic guidance. Ready for routine use. Electronically Signed   By: Lucrezia Europe M.D.   On: 06/21/2019 14:02   IR US Guide Vasc Access Right  Result Date: 06/14/2019 INDICATION: 53 year old male with acute renal failure in need of hemodialysis. He is currently anticoagulated and thrombocytopenic. Therefore, a temporary non tunneled hemodialysis catheter will be placed. EXAM: IR RIGHT FLUORO GUIDE CV LINE; IR ULTRASOUND GUIDANCE VASC ACCESS RIGHT MEDICATIONS: None ANESTHESIA/SEDATION: None FLUOROSCOPY TIME:  Fluoroscopy Time: 0 minutes 18 seconds (5.3 mGy). COMPLICATIONS: None  immediate. PROCEDURE: Informed written consent was obtained from the patient after a thorough discussion of the procedural risks, benefits and alternatives. All questions were addressed. Maximal Sterile Barrier Technique was utilized including caps, mask, sterile gowns, sterile gloves, sterile drape, hand hygiene and skin antiseptic. A timeout was performed prior to the initiation of the procedure. The right internal jugular vein was interrogated with ultrasound and found to be widely patent. An image was obtained and stored for the medical record. Local anesthesia was attained by infiltration with 1% lidocaine. A small dermatotomy was made.  Under real-time sonographic guidance, the vessel was punctured with a 21 gauge micropuncture needle. Using standard technique, the initial micro needle was exchanged over a 0.018 micro wire for a transitional 4 [place] micro sheath. The micro sheath was then exchanged over a 0.035 wire for a fascial dilator and the soft tissue tract was dilated. A 20 cm triple-lumen non tunneled hemodialysis catheter was then advanced over the wire and position with the tip in the upper right atrium. The catheter flushes and aspirates easily. The catheter was flushed with heparinized saline and secured to the skin with 0 Prolene suture. A sterile bandage was applied. The catheter hubs were capped. IMPRESSION: Successful placement of a right IJ approach 20 cm triple-lumen non tunneled hemodialysis catheter. The catheter tip is in the upper right atrium and ready for immediate use. Electronically Signed   By: Jacqulynn Cadet M.D.   On: 06/14/2019 18:59   CT Renal Stone Study  Result Date: 06/11/2019 CLINICAL DATA:  Fall yesterday, rt hip pain. Pt recently discharged from [facility] Multiple UTI since then after foley removed. Here for inability to void, as well as burning upon urination. History of DM, Migraine, seizure, CKD, DVT, PE. Pt unable to hold arms up for abd images due to prior shoulder surgery. EXAM: CT ABDOMEN AND PELVIS WITHOUT CONTRAST TECHNIQUE: Multidetector CT imaging of the abdomen and pelvis was performed following the standard protocol without IV contrast. COMPARISON:  06/04/2019 FINDINGS: Lower chest: There mild hazy opacities at the lung bases as well as linear areas of presumed atelectasis or scarring, similar to the prior CT allowing artifact from overlying arms on the current exam. No new lung base abnormalities. Hepatobiliary: Liver demonstrates central volume loss and relative enlargement of the lateral segment left lobe, findings suggesting cirrhosis. No liver mass or focal lesion. Status post  cholecystectomy. No bile duct dilation Pancreas: Significant fatty replacement. No defined mass or inflammation. Spleen: Enlarged, 20 x 7.8 x 21 cm. This is stable from recent exam. No splenic mass or focal lesion. Adrenals/Urinary Tract: No adrenal mass. Small peripherally calcified cysts reflecting residual left kidney, unchanged. Right kidney shows mild dilation of the intrarenal collecting system. Collecting system contents demonstrate increased attenuation similar to prior CT. There is a mass in the lower pole with peripheral increased attenuation. Mass measures 4.7 x 3.4 x 3.2 cm. The mass corresponds to a complex cyst noted on ultrasound dated 05/11/2019. Findings are consistent hemorrhage into the cyst, which is communicating with the collecting system. No other evidence of mass. No stones. Right ureters normal in course and in caliber. No stones. Bladder is decompressed with a Foley catheter Stomach/Bowel: Stomach is unremarkable. Small bowel and colon are normal in caliber. No wall thickening. No inflammation. Appendix not visualized. No evidence of appendicitis. Vascular/Lymphatic: Mild aortic atherosclerotic calcifications. Enlarged splenic and portal veins. Prominent to mildly enlarged lymph nodes adjacent to the portal vein, largest measuring 1.4 cm in short axis.  No other enlarged lymph nodes. Reproductive: Unremarkable. Other: No ascites.  No abdominal wall hernia. Musculoskeletal: No fracture or acute finding. No osteoblastic or osteolytic lesions. IMPRESSION: 1. Hemorrhage into a right renal lower pole cysts, or dilated calyx, which is communicating with the right intrarenal collecting system. Right renal collecting system is mildly dilated. There is no ureteral dilation, however, no renal or ureteral stones. This appearance is similar to the recent prior CT. 2. Hazy opacities at the lung bases consistent residual COVID-19 infection, also stable from the recent prior abdomen and pelvis CT. 3.  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 4. Marked chronic atrophy of the left kidney with residual peripherally calcified cysts, stable. 5. Mild aortic atherosclerosis. 6. No fracture of the visualized skeletal structures, which includes the right proximal femur to the intertrochanteric region. Electronically Signed   By: Lajean Manes M.D.   On: 06/11/2019 16:59   CT Renal Stone Study  Result Date: 06/04/2019 CLINICAL DATA:  Flank pain. Recurrent urinary tract infections. EXAM: CT ABDOMEN AND PELVIS WITHOUT CONTRAST TECHNIQUE: Multidetector CT imaging of the abdomen and pelvis was performed following the standard protocol without IV contrast. COMPARISON:  12/28/2018 FINDINGS: Lower chest: Increased patchy areas airspace disease are seen in both lower lungs, right side greater than left. No evidence of pleural effusion. Hepatobiliary: No mass visualized on this unenhanced exam. Mild capsular nodularity and left and caudate lobe hypertrophy again seen, highly suspicious for cirrhosis. Prior cholecystectomy. No evidence of biliary obstruction. Pancreas: No mass or inflammatory process visualized on this unenhanced exam. Spleen: Moderate splenomegaly measuring 18-19 cm in length is stable and consistent with portal venous hypertension. Adrenals/Urinary tract: Chronic atrophy of the left kidney with several small peripherally calcified cyst is unchanged in appearance. Compensatory hypertrophy of the right kidney is again seen. Mild increase in hazy opacity is seen in the right perinephric region. Mild right pelvicaliectasis is seen containing high attenuation fluid suspicious for hemorrhage. No evidence of renal or ureteral calculi. Unremarkable unopacified urinary bladder. Stomach/Bowel: No evidence of obstruction, inflammatory process, or abnormal fluid collections. Vascular/Lymphatic: No pathologically enlarged lymph  nodes identified. No evidence of abdominal aortic aneurysm. Aortic atherosclerosis. Reproductive:  No mass or other significant abnormality. Other:  None. Musculoskeletal:  No suspicious bone lesions identified. IMPRESSION: 1. Increased mild right renal pelvicaliectasis and hazy perinephric opacity. No obstructing calculi identified. These findings are nonspecific, but pyelonephritis cannot be excluded. 2. Stable chronic left renal atrophy. 3. Stable findings of hepatic cirrhosis and portal venous hypertension. No evidence of ascites. 4. Increased patchy areas of airspace disease in both lower lungs, right side greater than left, suspicious for atypical infectious or inflammatory process. Aortic Atherosclerosis (ICD10-I70.0). Electronically Signed   By: Marlaine Hind M.D.   On: 06/04/2019 10:13   VAS [place] UPPER EXT VEIN MAPPING (PRE-OP AVF)  Result Date: 06/15/2019 UPPER EXTREMITY VEIN MAPPING  Indications: Pre-access. Limitations: patient body habitus, patient positioning, patient movement Comparison Study: No prior studies. Performing Technologist: Oliver Hum RVT  Examination Guidelines: A complete evaluation includes B-mode imaging, spectral Doppler, color Doppler, and power Doppler as needed of all accessible portions of each vessel. Bilateral testing is considered an integral part of a complete examination. Limited examinations for reoccurring indications may be performed as noted. +-----------------+-------------+----------+---------+ Right Cephalic   Diameter (cm)Depth (cm)Findings  +-----------------+-------------+----------+---------+ Shoulder             0.54  1.41             +-----------------+-------------+----------+---------+ Prox upper arm       0.40        1.47   branching +-----------------+-------------+----------+---------+ Mid upper arm        0.48        0.99             +-----------------+-------------+----------+---------+ Dist upper arm       0.60         0.51             +-----------------+-------------+----------+---------+ Antecubital fossa    0.42        0.41   branching +-----------------+-------------+----------+---------+ Prox forearm         0.21        0.80             +-----------------+-------------+----------+---------+ Mid forearm          0.17        0.75             +-----------------+-------------+----------+---------+ Dist forearm         0.14        0.20             +-----------------+-------------+----------+---------+ +-----------------+-------------+----------+--------------+ Right Basilic    Diameter (cm)Depth (cm)   Findings    +-----------------+-------------+----------+--------------+ Shoulder                                not visualized +-----------------+-------------+----------+--------------+ Prox upper arm                          not visualized +-----------------+-------------+----------+--------------+ Mid upper arm                           not visualized +-----------------+-------------+----------+--------------+ Dist upper arm                          not visualized +-----------------+-------------+----------+--------------+ Antecubital fossa                       not visualized +-----------------+-------------+----------+--------------+ Prox forearm                            not visualized +-----------------+-------------+----------+--------------+ Mid forearm                             not visualized +-----------------+-------------+----------+--------------+ Distal forearm                          not visualized +-----------------+-------------+----------+--------------+ +-----------------+-------------+----------+---------+ Left Cephalic    Diameter (cm)Depth (cm)Findings  +-----------------+-------------+----------+---------+ Shoulder             0.68        3.10             +-----------------+-------------+----------+---------+ Prox upper arm        0.44        1.16             +-----------------+-------------+----------+---------+ Mid upper arm        0.41        0.84             +-----------------+-------------+----------+---------+ Dist upper arm  0.57        0.43             +-----------------+-------------+----------+---------+ Antecubital fossa    0.42        0.46   branching +-----------------+-------------+----------+---------+ Prox forearm         0.34        0.72             +-----------------+-------------+----------+---------+ Mid forearm          0.34        0.76   branching +-----------------+-------------+----------+---------+ Dist forearm         0.33        0.37             +-----------------+-------------+----------+---------+ *See table(s) above for measurements and observations.  Diagnosing physician: Servando Snare MD Electronically signed by Servando Snare MD on 06/15/2019 at 3:37:10 PM.    Final      Time Spent in minutes  30     Desiree Hane M.D on 06/24/2019 at 10:21 PM  To page go to [email or web address] - password [facility]

## 2019-06-24 NOTE — Progress Notes (Signed)
Received report from AM shift nurse Mariea Clonts, pt. HD site has old drainage from previous bleed on the site. Gauze was inforced on the area and IV team was consulted. IV team raymond came, assessed and checked pt.'s HD site and dressing. was told by IV team nurse that he is unable to change HD dressing as it is not yet 48 hours since HD cath was placed. And pt is going for dialysis in the morning, it can be changed in the dialysis unit. He reinforced again with gauzed and taped. And continue to monitor the site. At 3:30 am, noted on the site scant amount of bright red blood on the site. Called pharmacy for consult regarding heparin drip going on. ASked to put a STAT order of heparin level and INR for lab. No other issues.

## 2019-06-24 NOTE — Procedures (Signed)
Patient was seen on dialysis and the procedure was supervised.  BFR 400  Via TDC BP is  113/53.   Patient appears to be tolerating treatment well  [name] 06/24/2019

## 2019-06-24 NOTE — Progress Notes (Signed)
Physical Therapy Cancellation Note   06/24/19 1242  PT Visit Information  Last PT Received On 06/24/19  Reason Eval/Treat Not Completed Patient at procedure or test/unavailable (patient in HD. PT will continue to follow acutely. )   Earney Navy, PTA Acute Rehabilitation Services Pager: [phone] Office: [phone]

## 2019-06-24 NOTE — Progress Notes (Signed)
[facility] NEPHROLOGY PROGRESS NOTE  Assessment/ Plan: Pt is a 53 y.o. yo male with morbid obesity, OSA, pulmonary embolus, seizure, DM, HTN, RA, bipolar, CKD 4/5 follows at [place], multiple recent hospitalization for CIVID-PNA, urosepsis, AKI admitted with decreased urine output, gross hematuria and worsening renal failure.  [facility] to [facility] for HD.  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   oliguric and uremic.  Status post right IJ non-tunnel catheter placed by IR -  convert [facility] 12/23,  Status post left brachiocephalic AV fistula creation by Dr. Scot Dock on 12/21. First HD on 12/17, tolerated 3 treatments with significant clinical improvement.  4th tx 12/23, 5th today- will now be MWF schedule. Needs to have enough mobility to get into HD chair to dialyze OP- lives in [place].  Next HD here will  be on Monday  #Gross hematuria CT scan with hemorrhagic right kidney cyst.  He has Foley catheter.  Seen by urology,  conservative management.  #Anemia due to CKD and blood loss: Iron saturation 16%.  Continue IV iron and Aranesp during HD- inc dose.  Received PRBC this hosp.  #CKD-MBD: Phosphorus level elevated. PTH  86.  Continue phoslo .  Lab expect to improve with HD. PTH 86- no meds  #Acute encephalopathy, due to medication and uremia:  improved.  #Morbid obesity  Subjective: No new complaints -  Making a good amount of urine but crt rising in between treatments   Objective Vital signs in last 24 hours: Vitals:   06/24/19 0627 06/24/19 0840 06/24/19 0850 06/24/19 0900  BP: (!) 109/53 (!) 116/58 (!) 110/58 (!) 110/53  Pulse:  64 64 66  Resp:  18 18 17   Temp:  97.7 F (36.5 C)    TempSrc:  Axillary    SpO2:  96%    Weight:      Height:       Weight change:   Intake/Output Summary (Last 24 hours) at 06/24/2019 1031 Last data filed at 06/24/2019 0600 Gross per 24 hour  Intake 737.72  ml  Output 1150 ml  Net -412.28 ml       Labs: Basic Metabolic Panel: Recent Labs  Lab 06/20/19 1413 06/21/19 0835 06/22/19 0430 06/24/19 0912  NA 136 138 137 133*  K 4.0 4.1 3.9 4.0  CL 99 99 98 98  CO2 29 30 29 26   GLUCOSE 159* 152* 142* 203*  BUN 30* 43* 29* 63*  CREATININE 3.84* 4.40* 3.54* 5.13*  CALCIUM 8.6* 8.7* 8.8* 8.5*  PHOS 4.3 6.3*  --  5.4*   Liver Function Tests: Recent Labs  Lab 06/20/19 1413 06/21/19 0835 06/24/19 0912  ALBUMIN 2.5* 2.4* 2.4*   No results for input(s): LIPASE, AMYLASE in the last 168 hours. No results for input(s): AMMONIA in the last 168 hours. CBC: Recent Labs  Lab 06/21/19 0424 06/21/19 0835 06/22/19 0430 06/23/19 0424 06/24/19 0443  WBC 4.2 3.4* 4.2 4.0 3.4*  HGB 9.1* 8.2* 8.5* 8.2* 8.0*  HCT 29.8* 26.7* 27.3* 25.5* 25.7*  MCV 100.3* 99.6 98.2 97.7 99.6  PLT 80* 86* 85* 81* 80*   Cardiac Enzymes: No results for input(s): CKTOTAL, CKMB, CKMBINDEX, TROPONINI in the last 168 hours. CBG: Recent Labs  Lab 06/23/19 0705 06/23/19 1141 06/23/19 1640 06/23/19 2019 06/24/19 0721  GLUCAP 166* 130* 172* 217* 181*    Iron Studies: No results for input(s): IRON, TIBC, TRANSFERRIN, FERRITIN in the last 72 hours.  Studies/Results: No results found.  Medications: Infusions: . sodium chloride 250 mL (06/15/19 1130)  . sodium chloride    . heparin 2,700 Units/hr (06/24/19 0515)    Scheduled Medications: . busPIRone  15 mg Oral BID  . calcium acetate  1,334 mg Oral TID WC  . Chlorhexidine Gluconate Cloth  6 each Topical Q0600  . Chlorhexidine Gluconate Cloth  6 each Topical Q0600  . darbepoetin (ARANESP) injection - DIALYSIS  150 mcg Intravenous Q Sat-HD  . DULoxetine  60 mg Oral Daily  . fentaNYL  1 patch Transdermal Q3 days  . finasteride  5 mg Oral Daily  . insulin aspart  0-9 Units Subcutaneous TID WC  . insulin glargine  45 Units Subcutaneous BID  . lamoTRIgine  200 mg Oral BID  . levETIRAcetam  500 mg Oral BID   . pregabalin  50 mg Oral TID  . simvastatin  20 mg Oral QHS  . tamsulosin  0.4 mg Oral Daily  . traZODone  50 mg Oral QHS  . warfarin  7.5 mg Oral ONCE-1800  . Warfarin - Pharmacist Dosing Inpatient   Does not apply q1800    have reviewed scheduled and prn medications.  Physical Exam: General: Morbidly obese male lying on bed comfortable, not in distress Heart:RRR, s1s2 nl, no rub Lungs: Distant breath sound, no crackle appreciated Abdomen:soft, Non-tender, non-distended Extremities: Bilateral lower extremity edema, improving Neurology: More alert awake and pleasant. Dialysis Access: Right  tunneled placed by IR on 12/23- oozing some.  Left AV fistula placed 12/21 has good thrill and bruit, wound looks good.  Quinette Hentges A Carinne Brandenburger 06/24/2019,10:31 AM  LOS: 13 days

## 2019-06-24 NOTE — Progress Notes (Signed)
ANTICOAGULATION CONSULT NOTE - Follow-Up Consult  Pharmacy Consult for heparin IV Indication: h/o pulmonary embolus and DVT  Patient Measurements: Height: 6\' 3"  (190.5 cm) Weight: (!) 432 lb 1.6 oz (196 kg) IBW/kg (Calculated) : 84.5 Heparin Dosing Weight: 131.1 kg  Vital Signs: Temp: 98 F (36.7 C) (12/26 0520) BP: 109/53 (12/26 0627) Pulse Rate: 70 (12/26 0520)  Labs:  Recent Labs    06/19/19 1513 06/20/19 0504 06/20/19 1413 06/21/19 0424 06/21/19 0835 06/22/19 0430 06/22/19 0918  HGB   < > 7.4*  --  9.1* 8.2* 8.5*  --   HCT   < > 23.6*  --  29.8* 26.7* 27.3*  --   PLT   < > 83*  --  80* 86* 85*  --   LABPROT  --  15.5*  --  15.6*  --  15.2  --   INR  --  1.2  --  1.3*  --  1.2  --   HEPARINUNFRC   < > <0.10*  --  0.52  --  0.17* 0.26*  CREATININE  --   --  3.84*  --  4.40* 3.54*  --    < > = values in this interval not displayed.    Estimated Creatinine Clearance: 44.6 mL/min (A) (by C-G formula based on SCr of 3.54 mg/dL (H)).   Assessment: 33 yr ole male with a PMH significant for multiple DVT/PE, on warfarin PTA. Warfarin was held due to gross hematuria on admit (S/P 5 unit RBCs). Heparin bridge started 12/20 - then held 12/21 AM for AVF placement; held again 12/23 AM for a tunneled HD cath. Heparin was resumed 6 hr post procedure.  Heparin level today is 0.32 units/ml, which is within the goal range for this pt. Per RN, patient started to have some bleeding around HD site yesterday afternoon after patient had put pressure on the site. Patient has HD session today which they plan to change the dressing around the site. RN states the bleeding is manageable, slow and not too bad. I instructed the RN to continue the heparin since the heparin is on the lower end of the heparin goal and bleeding is manageable. However, I ask the RN to let us know if the bleeding becomes unmanageable and more significant.   INR today remains subtherapeutic (INR 1.3, goal of 2-3). H/H and  plt low, but stable (has chronic thrombocytopenia).  CBC low but stable/improving   Goal of Therapy:  Heparin level 0.3-0.5 units/ml Monitor platelets by anticoagulation protocol: Yes   Plan:  Continue heparin at 2700 units/hr Warfarin 7.5 mg x 1  Monitor daily PT/INR, CBC, heparin level Monitor for signs/symptoms of bleeding   Acey Lav, PharmD  PGY1 Acute Care Pharmacy Resident [phone]

## 2019-06-25 DIAGNOSIS — N186 End stage renal disease: Secondary | ICD-10-CM

## 2019-06-25 DIAGNOSIS — D696 Thrombocytopenia, unspecified: Secondary | ICD-10-CM

## 2019-06-25 LAB — CBC
HCT: 25.8 % — ABNORMAL LOW (ref 39.0–52.0)
Hemoglobin: 8.2 g/dL — ABNORMAL LOW (ref 13.0–17.0)
MCH: 31.1 pg (ref 26.0–34.0)
MCHC: 31.8 g/dL (ref 30.0–36.0)
MCV: 97.7 fL (ref 80.0–100.0)
Platelets: 78 10*3/uL — ABNORMAL LOW (ref 150–400)
RBC: 2.64 MIL/uL — ABNORMAL LOW (ref 4.22–5.81)
RDW: 14.4 % (ref 11.5–15.5)
WBC: 3.5 10*3/uL — ABNORMAL LOW (ref 4.0–10.5)
nRBC: 0 % (ref 0.0–0.2)

## 2019-06-25 LAB — HEPARIN LEVEL (UNFRACTIONATED)
Heparin Unfractionated: 0.19 IU/mL — ABNORMAL LOW (ref 0.30–0.70)
Heparin Unfractionated: 0.24 IU/mL — ABNORMAL LOW (ref 0.30–0.70)

## 2019-06-25 LAB — PROTIME-INR
INR: 1.5 — ABNORMAL HIGH (ref 0.8–1.2)
Prothrombin Time: 18.2 seconds — ABNORMAL HIGH (ref 11.4–15.2)

## 2019-06-25 LAB — GLUCOSE, CAPILLARY
Glucose-Capillary: 136 mg/dL — ABNORMAL HIGH (ref 70–99)
Glucose-Capillary: 184 mg/dL — ABNORMAL HIGH (ref 70–99)
Glucose-Capillary: 172 mg/dL — ABNORMAL HIGH (ref 70–99)
Glucose-Capillary: 194 mg/dL — ABNORMAL HIGH (ref 70–99)

## 2019-06-25 MED ORDER — WARFARIN SODIUM 7.5 MG PO TABS
7.5000 mg | ORAL_TABLET | Freq: Once | ORAL | Status: AC
  Administered 2019-06-25: 7.5 mg via ORAL
  Filled 2019-06-25: qty 1

## 2019-06-25 MED ORDER — CHLORHEXIDINE GLUCONATE CLOTH 2 % EX PADS
6.0000 | MEDICATED_PAD | Freq: Every day | CUTANEOUS | Status: DC
  Administered 2019-06-25 – 2019-06-30 (×4): 6 via TOPICAL

## 2019-06-25 NOTE — Progress Notes (Signed)
ANTICOAGULATION CONSULT NOTE - Follow-Up Consult  Pharmacy Consult for heparin IV Indication: h/o pulmonary embolus and DVT  Patient Measurements: Height: 6\' 3"  (190.5 cm) Weight: (!) 432 lb 1.6 oz (196 kg) IBW/kg (Calculated) : 84.5 Heparin Dosing Weight: 131.1 kg  Vital Signs: Temp: 98.9 F (37.2 C) (12/27 0830) Temp Source: Oral (12/27 0830) BP: 121/71 (12/27 0456) Pulse Rate: 68 (12/27 0830)  Labs:  Recent Labs    06/19/19 1513 06/20/19 0504 06/20/19 1413 06/21/19 0424 06/21/19 0835 06/22/19 0430 06/22/19 0918  HGB   < > 7.4*  --  9.1* 8.2* 8.5*  --   HCT   < > 23.6*  --  29.8* 26.7* 27.3*  --   PLT   < > 83*  --  80* 86* 85*  --   LABPROT  --  15.5*  --  15.6*  --  15.2  --   INR  --  1.2  --  1.3*  --  1.2  --   HEPARINUNFRC   < > <0.10*  --  0.52  --  0.17* 0.26*  CREATININE  --   --  3.84*  --  4.40* 3.54*  --    < > = values in this interval not displayed.    Estimated Creatinine Clearance: 30.8 mL/min (A) (by C-G formula based on SCr of 5.13 mg/dL (H)).   Assessment: 62 yr ole male with a PMH significant for multiple DVT/PE, on warfarin PTA. Warfarin was held due to gross hematuria on admit (S/P 5 unit RBCs). Heparin bridge started 12/20 - then held 12/21 AM for AVF placement; held again 12/23 AM for a tunneled HD cath. Heparin was resumed 6 hr post procedure.  Heparin level today is 0.19 units/ml, which is below goal range for this pt. Patient started to have some bleeding around HD site on the afternoon of 12/25 after patient had put pressure on the site.RN states bleeding site is improving and is not concerning. I instructed the RN to continue the heparin and I ask the RN to let us know if the bleeding becomes unmanageable and more significant.   INR today remains subtherapeutic (INR 1.5, goal of 2-3). H/H and plt low, but stable (has chronic thrombocytopenia).  CBC low but stable/improving   Goal of Therapy:  Heparin level 0.3-0.5 units/ml Monitor  platelets by anticoagulation protocol: Yes   Plan:  Increase heparin gtt to 2900 units/hr  - F/u HL in 6 hr Warfarin 7.5 mg x 1  Monitor daily PT/INR, CBC, heparin level Monitor for signs/symptoms of bleeding   Acey Lav, PharmD  PGY1 Acute Care Pharmacy Resident [phone]

## 2019-06-25 NOTE — Progress Notes (Signed)
Admit: 06/11/2019 LOS: 12  17M new ESRD, OSA on CPAP, morbid obesity, hx/o PE, DM, HTN, BPAD  Subjective:  . No new issues but c/o scrotal edema . Tol HD yesterday   12/26 0701 - 12/27 0700 In: 1316 [P.O.:1100; I.V.:216] Out: 4250 [Urine:1250]  Filed Weights   06/19/19 1620 06/20/19 0444 06/25/19 0900  Weight: (!) 196 kg (!) 196 kg (!) 196 kg    Scheduled Meds: . busPIRone  15 mg Oral BID  . calcium acetate  1,334 mg Oral TID WC  . Chlorhexidine Gluconate Cloth  6 each Topical Q0600  . Chlorhexidine Gluconate Cloth  6 each Topical Q0600  . darbepoetin (ARANESP) injection - DIALYSIS  150 mcg Intravenous Q Sat-HD  . DULoxetine  60 mg Oral Daily  . fentaNYL  1 patch Transdermal Q3 days  . finasteride  5 mg Oral Daily  . insulin aspart  0-9 Units Subcutaneous TID WC  . insulin glargine  45 Units Subcutaneous BID  . lamoTRIgine  200 mg Oral BID  . levETIRAcetam  500 mg Oral BID  . pregabalin  50 mg Oral TID  . simvastatin  20 mg Oral QHS  . tamsulosin  0.4 mg Oral Daily  . traZODone  50 mg Oral QHS  . warfarin  7.5 mg Oral ONCE-1800  . Warfarin - Pharmacist Dosing Inpatient   Does not apply q1800   Continuous Infusions: . sodium chloride 250 mL (06/15/19 1130)  . sodium chloride    . heparin 2,900 Units/hr (06/25/19 0939)   PRN Meds:.sodium chloride, acetaminophen **OR** acetaminophen, albuterol, diphenhydrAMINE, fluticasone, HYDROcodone-acetaminophen, ondansetron **OR** ondansetron (ZOFRAN) IV, polyethylene glycol, sodium chloride  Current Labs: reviewed    Physical Exam:  Blood pressure (!) 99/44, pulse 68, temperature 98.9 F (37.2 C), temperature source Oral, resp. rate 15, height 6\' 3"  (1.905 m), weight (!) 196 kg, SpO2 96 %. Morbidly obese in bed Regular LUE AVF +B/T R IJ TDC c/d/i, bandaged Regular, no apprec rub 2+ LEE  A 1. New ESRD, s/p AVF LUE BC 12/21 and R IJ TDC 12/23; Start HD 12/17, needs CLIP to [facility].  Cont HD MWF schedule 2. Gross hematuria: st/p  uro eval 3. Anemia: on Fe and ESA, CTM 4. AMS, improved 5. CKD-BMD: PHosLo, PTH ok, CTM 6. OSA on CPAP 7. Obesity 8. Debility, needs aggressive PT/OT  P . HD tomorrow: 4h, TDC, 4L UF, 400/800, 3K, Tight heparin . Medication Issues; o Preferred narcotic agents for pain control are hydromorphone, fentanyl, and methadone. Morphine should not be used.  o Baclofen should be avoided o Avoid oral sodium phosphate and magnesium citrate based laxatives / bowel preps    Pearson Grippe MD 06/25/2019, 11:47 AM  Recent Labs  Lab 06/20/19 1413 06/21/19 0835 06/22/19 0430 06/24/19 0912  NA 136 138 137 133*  K 4.0 4.1 3.9 4.0  CL 99 99 98 98  CO2 29 30 29 26   GLUCOSE 159* 152* 142* 203*  BUN 30* 43* 29* 63*  CREATININE 3.84* 4.40* 3.54* 5.13*  CALCIUM 8.6* 8.7* 8.8* 8.5*  PHOS 4.3 6.3*  --  5.4*   Recent Labs  Lab 06/23/19 0424 06/24/19 0443 06/25/19 0445  WBC 4.0 3.4* 3.5*  HGB 8.2* 8.0* 8.2*  HCT 25.5* 25.7* 25.8*  MCV 97.7 99.6 97.7  PLT 81* 80* 78*

## 2019-06-25 NOTE — Progress Notes (Signed)
PROGRESS NOTE  [name] [id number] DOB: 20-Sep-1965 DOA: 06/11/2019 PCP: Sharion Balloon, FNP  Brief History   53 year old man PMH CKD stage V, chronic respiratory failure 3 L nasal cannula, OSA, OHS, diabetes mellitus type 2, pulmonary embolism, DVT, Covid pneumonia October 11-October 27, hospitalization December 6-December 10 for UTI with gross hematuria, complicated pyelonephritis, acute on chronic CKD, presented with inability to void any pain, creatinine 9.42 and transferred to [facility] for hemodialysis.  A & P  New ESRD, initially AKI superimposed on CKD stage V.  Status post AV fistula 12/21, right IJ tunneled dialysis catheter 12/23, started on HD 12/17. --Continue MWF schedule, CLIP --Avoid morphine, baclofen, sodium phosphate, magnesium citrate based laxatives  Gross hematuria, with associated acute blood loss anemia, resolved.  Hemorrhagic right kidney cyst on CT scan.  Seen by urology 12/17, felt to have no urgent need for cystoscopy given improvement.  Patient poor candidate for embolization versus nephrectomy. --Continue conservative management with Foley catheter and finasteride --Follow CBC, currently stable  PMH DVT, PE on warfarin as an outpatient.  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 --Continue warfarin per pharmacy protocol, continue heparin bridge  Diabetes mellitus type 2 --CBG stable.  Continue Lantus, sliding scale insulin, simvastatin  Anemia of CKD, anemia of acute blood loss.  Status post total 5 units PRBC during hospitalization. --Hemoglobin stable  Chronic thrombocytopenia --Stable inpatient.  Etiology unclear.  Follow-up as an outpatient.  Chronic respiratory failure on home oxygen 3-4 L nasal cannula, OSA, OHS,  --Appears stable.  Mood disorder, bipolar disorder --Appears stable.  Continue BuSpar, Cymbalta, Lamictal, Keppra, trazodone  Seizure disorder --Appears stable.  Continue Keppra, Lamictal  Chronic pain  syndrome --Stable.  Continue fentanyl patch  Morbid obesity. Body mass index is 54.01 kg/m.   Debility, wheelchair-bound at baseline --PT   Resolved Hospital Problem list    Acute metabolic encephalopathy, multifactorial including uremia, multiple narcotics in the setting of worsening kidney dysfunction and poor renal clearance.  Improved while holding BuSpar, Cymbalta, Lyrica, narcotics, HD   DVT prophylaxis: Heparin infusion, warfarin Code Status: Full Family Communication: none Disposition Plan: home?    Murray Hodgkins, MD  [facility] Direct contact: see [email or web address] (further directions at bottom of note if needed) 7PM-7AM contact night coverage as at bottom of note 06/25/2019, 12:35 PM  LOS: 14 days   Significant Hospital Events   . 12/13 admitted to [facility] . 12/15 transferred to [facility] for hemodialysis . 12/16 tunneled dialysis catheter insertion . 12/17 hemodialysis initiated . 12/21 left AV fistula   Consults:  . Nephrology . Vascular surgery . Interventional radiology . Urology   Procedures:  .   Significant Diagnostic Tests:  Marland Kitchen    Micro Data:  .    Antimicrobials:  .   Interval History/Subjective  Feels okay today, no complaints.  Objective   Vitals:  Vitals:   06/25/19 0830 06/25/19 0913  BP:  (!) 99/44  Pulse: 68   Resp: 15   Temp: 98.9 F (37.2 C)   SpO2: 96%     Exam:  Constitutional.  Appears calm, comfortable. Psychiatric.  Grossly normal mood and affect.  Speech fluent and clear. Cardiovascular.  Regular rate and rhythm.  No murmur, rub or gallop. Respiratory.  Clear to auscultation bilaterally.  No wheezes, rales or rhonchi.  Normal respiratory effort.  I have personally reviewed the following:   Today's Data  . CBG stable . Hemoglobin stable at 8.2, platelets stable at 78.  INR slightly higher at 1.5.  Scheduled Meds: . busPIRone  15 mg Oral BID  . calcium acetate  1,334 mg Oral TID WC  .  Chlorhexidine Gluconate Cloth  6 each Topical Q0600  . Chlorhexidine Gluconate Cloth  6 each Topical Q0600  . Chlorhexidine Gluconate Cloth  6 each Topical Q0600  . darbepoetin (ARANESP) injection - DIALYSIS  150 mcg Intravenous Q Sat-HD  . DULoxetine  60 mg Oral Daily  . fentaNYL  1 patch Transdermal Q3 days  . finasteride  5 mg Oral Daily  . insulin aspart  0-9 Units Subcutaneous TID WC  . insulin glargine  45 Units Subcutaneous BID  . lamoTRIgine  200 mg Oral BID  . levETIRAcetam  500 mg Oral BID  . pregabalin  50 mg Oral TID  . simvastatin  20 mg Oral QHS  . tamsulosin  0.4 mg Oral Daily  . traZODone  50 mg Oral QHS  . warfarin  7.5 mg Oral ONCE-1800  . Warfarin - Pharmacist Dosing Inpatient   Does not apply q1800   Continuous Infusions: . sodium chloride 250 mL (06/15/19 1130)  . sodium chloride    . heparin 2,900 Units/hr (06/25/19 [id number])    Principal Problem:   ESRD (end stage renal disease) ([place]) Active Problems:   Morbid obesity (HCC)   OSA (obstructive sleep apnea)   Obesity hypoventilation syndrome (HCC)   DM2 (diabetes mellitus, type 2) (HCC)   History of pulmonary embolism   Chronic anticoagulation   Seizure disorder (HCC)   Anemia   Thrombocytopenia (HCC)   Chronic pain syndrome   History of DVT (deep vein thrombosis)   Hyperlipidemia associated with type 2 diabetes mellitus ([place])   Depression   Acute-on-chronic kidney injury ([place])   CKD (chronic kidney disease), stage V ([place])   LOS: 14 days   How to contact the [facility] Attending or Consulting provider 7A - 7P or covering provider during after hours 7P -7A, for this patient?  1. Check the care team in [facility] and look for a) attending/consulting TRH provider listed and b) the [facility] team listed 2. Log into [email or web address] and use [place]'s universal password to access. If you do not have the password, please contact the hospital operator. 3. Locate the [facility] provider you are looking for under [facility] and  page to a number that you can be directly reached. 4. If you still have difficulty reaching the provider, please page the [facility] (Director on Call) for the Hospitalists listed on amion for assistance.

## 2019-06-25 NOTE — Progress Notes (Signed)
ANTICOAGULATION CONSULT NOTE - Follow-Up Consult  Pharmacy Consult for heparin IV Indication: h/o pulmonary embolus and DVT  Patient Measurements: Height: 6\' 3"  (190.5 cm) Weight: (!) 432 lb 1.6 oz (196 kg) IBW/kg (Calculated) : 84.5 Heparin Dosing Weight: 131.1 kg  Vital Signs: Temp: 98.9 F (37.2 C) (12/27 0830) Temp Source: Oral (12/27 0830) BP: 99/44 (12/27 0913) Pulse Rate: 68 (12/27 0830)  Labs:  Recent Labs    06/19/19 1513 06/20/19 0504 06/20/19 1413 06/21/19 0424 06/21/19 0835 06/22/19 0430 06/22/19 0918  HGB   < > 7.4*  --  9.1* 8.2* 8.5*  --   HCT   < > 23.6*  --  29.8* 26.7* 27.3*  --   PLT   < > 83*  --  80* 86* 85*  --   LABPROT  --  15.5*  --  15.6*  --  15.2  --   INR  --  1.2  --  1.3*  --  1.2  --   HEPARINUNFRC   < > <0.10*  --  0.52  --  0.17* 0.26*  CREATININE  --   --  3.84*  --  4.40* 3.54*  --    < > = values in this interval not displayed.    Estimated Creatinine Clearance: 30.8 mL/min (A) (by C-G formula based on SCr of 5.13 mg/dL (H)).   Assessment: 54 yr ole male with a PMH significant for multiple DVT/PE, on warfarin PTA. Warfarin was held due to gross hematuria on admit (S/P 5 unit RBCs). Heparin bridge started 12/20 - then held 12/21 AM for AVF placement; held again 12/23 AM for a tunneled HD cath. Heparin was resumed 6 hr post procedure.  Heparin level this afternoon came back subtherapeutic at 0.24, on 2900 units/hr. Hgb stable at 8.2, plt 78 (low but chronic). No issues with bleeding - improved from yesterday.   Goal of Therapy:  Heparin level 0.3-0.5 units/ml Monitor platelets by anticoagulation protocol: Yes   Plan:  Increase heparin gtt to 3000 units/hr  - F/u HL in 7 hr Monitor daily PT/INR, CBC, heparin level Monitor for signs/symptoms of bleeding   Antonietta Jewel, PharmD, BCCCP Clinical Pharmacist  Phone: [phone]  Please check AMION for all [place] phone numbers After 10:00 PM, call [place] [phone]

## 2019-06-26 ENCOUNTER — Other Ambulatory Visit: Payer: Self-pay | Admitting: Family

## 2019-06-26 DIAGNOSIS — J189 Pneumonia, unspecified organism: Secondary | ICD-10-CM

## 2019-06-26 DIAGNOSIS — R21 Rash and other nonspecific skin eruption: Secondary | ICD-10-CM

## 2019-06-26 LAB — CBC
HCT: 26 % — ABNORMAL LOW (ref 39.0–52.0)
Hemoglobin: 8.2 g/dL — ABNORMAL LOW (ref 13.0–17.0)
MCH: 30.7 pg (ref 26.0–34.0)
MCHC: 31.5 g/dL (ref 30.0–36.0)
MCV: 97.4 fL (ref 80.0–100.0)
Platelets: 80 K/uL — ABNORMAL LOW (ref 150–400)
RBC: 2.67 MIL/uL — ABNORMAL LOW (ref 4.22–5.81)
RDW: 14.3 % (ref 11.5–15.5)
WBC: 3 K/uL — ABNORMAL LOW (ref 4.0–10.5)
nRBC: 0 % (ref 0.0–0.2)

## 2019-06-26 LAB — GLUCOSE, CAPILLARY
Glucose-Capillary: 164 mg/dL — ABNORMAL HIGH (ref 70–99)
Glucose-Capillary: 102 mg/dL — ABNORMAL HIGH (ref 70–99)
Glucose-Capillary: 135 mg/dL — ABNORMAL HIGH (ref 70–99)
Glucose-Capillary: 190 mg/dL — ABNORMAL HIGH (ref 70–99)

## 2019-06-26 LAB — PROTIME-INR
INR: 1.8 — ABNORMAL HIGH (ref 0.8–1.2)
Prothrombin Time: 20.4 s — ABNORMAL HIGH (ref 11.4–15.2)

## 2019-06-26 LAB — HEPARIN LEVEL (UNFRACTIONATED)
Heparin Unfractionated: 0.25 IU/mL — ABNORMAL LOW (ref 0.30–0.70)
Heparin Unfractionated: 0.33 [IU]/mL (ref 0.30–0.70)

## 2019-06-26 MED ORDER — WARFARIN SODIUM 7.5 MG PO TABS
7.5000 mg | ORAL_TABLET | Freq: Once | ORAL | Status: AC
  Administered 2019-06-26: 7.5 mg via ORAL
  Filled 2019-06-26: qty 1

## 2019-06-26 MED ORDER — HEPARIN SODIUM (PORCINE) 1000 UNIT/ML IJ SOLN
INTRAMUSCULAR | Status: AC
  Filled 2019-06-26: qty 5

## 2019-06-26 MED ORDER — HEPARIN SODIUM (PORCINE) 1000 UNIT/ML DIALYSIS
20.0000 [IU]/kg | INTRAMUSCULAR | Status: DC | PRN
  Administered 2019-06-26: 3800 [IU] via INTRAVENOUS_CENTRAL

## 2019-06-26 MED ORDER — HYDROCORTISONE 1 % EX CREA
TOPICAL_CREAM | Freq: Two times a day (BID) | CUTANEOUS | Status: DC
  Filled 2019-06-26: qty 28

## 2019-06-26 NOTE — Progress Notes (Addendum)
ANTICOAGULATION CONSULT NOTE - Follow Up Consult  Pharmacy Consult for heparin Indication: h/o PE/DVT  Labs: Recent Labs    06/24/19 0443 06/24/19 0912 06/25/19 0445 06/25/19 1533 06/26/19 0224  HGB 8.0*  --  8.2*  --  8.2*  HCT 25.7*  --  25.8*  --  26.0*  PLT 80*  --  78*  --  80*  LABPROT 17.1*  --  18.2*  --  20.4*  INR 1.4*  --  1.5*  --  1.8*  HEPARINUNFRC 0.32  --  0.19* 0.24* 0.25*  CREATININE  --  5.13*  --   --   --     Assessment: 53yo male remains subtherapeutic on heparin with very little change in level despite rate increase; no gtt issues or signs of bleeding other than some small nasal clots per RN; CBC stable.  Goal of Therapy:  Heparin level 0.3-0.5 units/ml   Plan:  Will increase heparin gtt by 10% to 3300 units/hr and check level in 8 hours.    Wynona Neat, PharmD, BCPS  06/26/2019,3:45 AM

## 2019-06-26 NOTE — Progress Notes (Addendum)
ANTICOAGULATION CONSULT NOTE - Follow-Up Consult  Pharmacy Consult for heparin IV Indication: h/o pulmonary embolus and DVT  Patient Measurements: Height: 6\' 3"  (190.5 cm) Weight: (!) 430 lb 12.5 oz (195.4 kg) IBW/kg (Calculated) : 84.5 Heparin Dosing Weight: 131.1 kg  Vital Signs: Temp: 99.5 F (37.5 C) (12/28 1659) Temp Source: Oral (12/28 1659) BP: 98/53 (12/28 1659) Pulse Rate: 68 (12/28 1659)  Labs:  Recent Labs    06/19/19 1513 06/20/19 0504 06/20/19 1413 06/21/19 0424 06/21/19 0835 06/22/19 0430 06/22/19 0918  HGB   < > 7.4*  --  9.1* 8.2* 8.5*  --   HCT   < > 23.6*  --  29.8* 26.7* 27.3*  --   PLT   < > 83*  --  80* 86* 85*  --   LABPROT  --  15.5*  --  15.6*  --  15.2  --   INR  --  1.2  --  1.3*  --  1.2  --   HEPARINUNFRC   < > <0.10*  --  0.52  --  0.17* 0.26*  CREATININE  --   --  3.84*  --  4.40* 3.54*  --    < > = values in this interval not displayed.    Estimated Creatinine Clearance: 30.7 mL/min (A) (by C-G formula based on SCr of 5.13 mg/dL (H)).   Assessment: 53 yr old male with a PMH significant for multiple DVT/PE, on warfarin PTA. Warfarin was held due to gross hematuria on admit (S/P 5 unit RBCs). Heparin bridge started 12/20 - then held 12/21 AM for AVF placement; held again 12/23 AM for a tunneled HD cath. Heparin was resumed 6 hr post procedure.  Heparin level ~13 hrs after heparin infusion was increased to 3300 units/hr is 0.33 units/ml, which is within the goal range for this pt. H/H 8.2/26.2, platelets 80 (CBC stable). INR today is 1.8. Per RN, no issues with IV or bleeding observed.  Goal of Therapy:  Heparin level 0.3-0.5 units/ml Monitor platelets by anticoagulation protocol: Yes   Plan:  Continue heparin infusion at 3300 units/hr Check confirmatory heparin level in 8 hrs Warfarin 7.5 mg PO X 1 today (already received dose this evening) Monitor daily PT/INR, CBC, heparin level Monitor for signs/symptoms of bleeding   Gillermina Hu, PharmD, BCPS, [facility] Clinical Pharmacist 06/26/19, 17:36 PM

## 2019-06-26 NOTE — Progress Notes (Addendum)
PROGRESS NOTE  [name] [id number] DOB: 20-Dec-1965 DOA: 06/11/2019 PCP: Sharion Balloon, FNP  Brief History   53 year old man PMH CKD stage V, chronic respiratory failure 3 L nasal cannula, OSA, OHS, diabetes mellitus type 2, pulmonary embolism, DVT, Covid pneumonia October 11-October 27, hospitalization December 6-December 10 for UTI with gross hematuria, complicated pyelonephritis, acute on chronic CKD, presented with inability to void any pain, creatinine 9.42 and transferred to [facility] for hemodialysis.  A & P  New ESRD, initially AKI superimposed on CKD stage V.  Status post AV fistula 12/21, right IJ tunneled dialysis catheter 12/23, started on HD 12/17. --Continue MWF schedule, CLIP --Avoid morphine, baclofen, sodium phosphate, magnesium citrate based laxatives  Gross hematuria, with associated acute blood loss anemia, resolved.  Hemorrhagic right kidney cyst on CT scan.  Seen by urology 12/17, felt to have no urgent need for cystoscopy given improvement.  Patient poor candidate for embolization versus nephrectomy. --Continue conservative management with Foley catheter and finasteride as recommended by Dr. Alyson Ingles.  PMH DVT, PE on warfarin as an outpatient.  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 --Continue warfarin per pharmacy protocol, continue heparin bridge.  INR slowing increasing.  Diabetes mellitus type 2 --CBG remains stable.  Continue Lantus, sliding scale insulin, simvastatin  Anemia of CKD, anemia of acute blood loss.  Status post total 5 units PRBC during hospitalization. --Hemoglobin remained stable  Chronic thrombocytopenia --Remains stable inpatient.  Etiology unclear.  Follow-up as an outpatient.  Chronic respiratory failure on home oxygen 3-4 L nasal cannula, OSA, OHS,  --Appears stable.  Mood disorder, bipolar disorder --Appears stable.  Continue BuSpar, Cymbalta, Lamictal, Keppra, trazodone  Seizure disorder --Appears stable.   Continue Keppra, Lamictal  Chronic pain syndrome --Stable.  Continue fentanyl patch  Morbid obesity. Body mass index is 53.84 kg/m.   Debility, wheelchair-bound at baseline --PT  Left arm rash --Nonspecific  Hydrocortisone cream.  Resolved Hospital Problem list    Acute metabolic encephalopathy, multifactorial including uremia, multiple narcotics in the setting of worsening kidney dysfunction and poor renal clearance.  Improved while holding BuSpar, Cymbalta, Lyrica, narcotics, HD   DVT prophylaxis: Heparin infusion, warfarin Code Status: Full Family Communication: Wife at bedside Disposition Plan: home with PT, OT   Murray Hodgkins, MD  [facility] Direct contact: see [email or web address] (further directions at bottom of note if needed) 7PM-7AM contact night coverage as at bottom of note 06/26/2019, 5:06 PM  LOS: 15 days   Significant Hospital Events   . 12/13 admitted to [facility] . 12/15 transferred to [facility] for hemodialysis . 12/16 tunneled dialysis catheter insertion . 12/17 hemodialysis initiated . 12/21 left AV fistula   Consults:  . Nephrology . Vascular surgery . Interventional radiology . Urology   Procedures:  .   Significant Diagnostic Tests:  Marland Kitchen    Micro Data:  .    Antimicrobials:  .   Interval History/Subjective  Reports a rash on his left arm which is itchy.  No known contact.  Otherwise just feels tired after dialysis.  Objective   Vitals:  Vitals:   06/26/19 1242 06/26/19 1659  BP:  (!) 98/53  Pulse:  68  Resp:  18  Temp: 98.5 F (36.9 C) 99.5 F (37.5 C)  SpO2:  97%    Exam:  Constitutional.  Appears calm, comfortable. Respiratory.  Clear to auscultation bilaterally.  No wheezes, rales or rhonchi.  Normal respiratory effort. Cardiovascular.  Regular rate and rhythm.  No murmur, rub  or gallop. Psychiatric.  Grossly normal mood and affect.  Speech fluent and appropriate. Skin.  Macular rash predominantly left  lower arm sparing the hand and upper arm near the shoulder.  The surgical incision in the left AC appears to be healing well is not involved.  I have personally reviewed the following:   Today's Data  . CBG stable . Hemoglobin stable at 8.2.  Platelets and WBC stable.  INR slowly increasing 1.8.  Scheduled Meds: . busPIRone  15 mg Oral BID  . calcium acetate  1,334 mg Oral TID WC  . Chlorhexidine Gluconate Cloth  6 each Topical Q0600  . Chlorhexidine Gluconate Cloth  6 each Topical Q0600  . Chlorhexidine Gluconate Cloth  6 each Topical Q0600  . darbepoetin (ARANESP) injection - DIALYSIS  150 mcg Intravenous Q Sat-HD  . DULoxetine  60 mg Oral Daily  . fentaNYL  1 patch Transdermal Q3 days  . finasteride  5 mg Oral Daily  . heparin      . insulin aspart  0-9 Units Subcutaneous TID WC  . insulin glargine  45 Units Subcutaneous BID  . lamoTRIgine  200 mg Oral BID  . levETIRAcetam  500 mg Oral BID  . pregabalin  50 mg Oral TID  . simvastatin  20 mg Oral QHS  . tamsulosin  0.4 mg Oral Daily  . traZODone  50 mg Oral QHS  . warfarin  7.5 mg Oral ONCE-1800  . Warfarin - Pharmacist Dosing Inpatient   Does not apply q1800   Continuous Infusions: . sodium chloride 250 mL (06/15/19 1130)  . sodium chloride    . heparin 3,300 Units/hr (06/26/19 1252)    Principal Problem:   ESRD (end stage renal disease) ([place]) Active Problems:   Morbid obesity (HCC)   OSA (obstructive sleep apnea)   Obesity hypoventilation syndrome (HCC)   DM2 (diabetes mellitus, type 2) (HCC)   History of pulmonary embolism   Chronic anticoagulation   Seizure disorder (HCC)   Anemia   Thrombocytopenia (HCC)   Chronic pain syndrome   History of DVT (deep vein thrombosis)   Hyperlipidemia associated with type 2 diabetes mellitus ([place])   Depression   Acute-on-chronic kidney injury ([place])   CKD (chronic kidney disease), stage V ([place])   LOS: 15 days   How to contact the [facility] Attending or Consulting provider 7A -  7P or covering provider during after hours 7P -7A, for this patient?  1. Check the care team in [facility] and look for a) attending/consulting TRH provider listed and b) the [facility] team listed 2. Log into [email or web address] and use [place]'s universal password to access. If you do not have the password, please contact the hospital operator. 3. Locate the [facility] provider you are looking for under [facility] and page to a number that you can be directly reached. 4. If you still have difficulty reaching the provider, please page the [facility] (Director on Call) for the Hospitalists listed on amion for assistance.

## 2019-06-26 NOTE — Progress Notes (Signed)
Occupational Therapy Treatment Patient Details Name: [name] MRN: [id number] DOB: 1966-06-14 Today's Date: 06/26/2019    History of present illness CORDARIO BRAUDE is a 53 y.o. male with medical history of CKD stage IV, bipolar disorder, chronic respiratory failure on 3 L, diabetes mellitus type 2, DVT, OSA/OHS, pulmonary embolus, seizure disorder, thrombocytopenia, hyperlipidemia, chronic pain syndrome presenting with decreased urine output and fall. (Here one month ago for similar complaints. Course complicated due to recent hospitalization for acute on chronic respiratory failure secondary to COVID-19 PNA.   OT comments  Pt making steady progress towards OT goals this session. Session focus on sitting balance, bed mobility and seated grooming tasks. Overall, pt requires MOD A +2 for bed mobility. Pt able to sit EOB ~ 10 minutes with close min guard for seated grooming tasks. Pt limited by pain, generalized weakness and decreased activity tolerance, however, continues to be motivated to return home. Plan for next session is to work on transfers from EOB> w/c in prep for DC home. Will continue to follow acutely per POC. DC plan currently remains appropriate.    Follow Up Recommendations  Home health OT;Supervision/Assistance - 24 hour(pt declining rehab)    Equipment Recommendations  Hospital bed;Other (comment)(hoyer lift)    Recommendations for Other Services      Precautions / Restrictions Precautions Precautions: Fall Restrictions Weight Bearing Restrictions: No       Mobility Bed Mobility Overal bed mobility: Needs Assistance Bed Mobility: Supine to Sit;Sit to Supine     Supine to sit: Mod assist;+2 for physical assistance Sit to supine: Mod assist;+2 for physical assistance   General bed mobility comments: heavy MOD A +2 for bed mobility, able to initiate transitioning BLEs towards EOB but requires MOD A +2 to scoot hips to EOB and elevate trunk.  Transfers                  General transfer comment: deferred    Balance Overall balance assessment: Needs assistance Sitting-balance support: Single extremity supported;Feet supported Sitting balance-Leahy Scale: Fair Sitting balance - Comments: One posterior LOB but able to self correct with cues, min guard for safety Postural control: Posterior lean   Standing balance-Leahy Scale: Zero                             ADL either performed or assessed with clinical judgement   ADL Overall ADL's : Needs assistance/impaired     Grooming: Set up;Sitting;Wash/dry hands;Wash/dry face;Supervision/safety;Min guard Grooming Details (indicate cue type and reason): supervision- min guard for safety sitting EOB                   Toilet Transfer Details (indicate cue type and reason): deferred        Tub/Shower Transfer Details (indicate cue type and reason): pt reports bed baths at baseline Functional mobility during ADLs: Moderate assistance;+2 for physical assistance;+2 for safety/equipment(bed mobility only , supine>sit) General ADL Comments: pt limited by weakness and decreased activity tolerance. session focus on sitting balance and seated grooming tasks     Vision   Vision Assessment?: No apparent visual deficits   Perception     Praxis      Cognition Arousal/Alertness: Awake/alert Behavior During Therapy: WFL for tasks assessed/performed Overall Cognitive Status: Within Functional Limits for tasks assessed  Exercises     Shoulder Instructions       General Comments pt on 4L O2 with sats WNL    Pertinent Vitals/ Pain       Pain Assessment: Faces Faces Pain Scale: Hurts little more Pain Location: L Knee with movement Pain Descriptors / Indicators: Grimacing;Discomfort Pain Intervention(s): Limited activity within patient's tolerance;Monitored during session;Repositioned  Home Living                                           Prior Functioning/Environment              Frequency  Min 2X/week        Progress Toward Goals  OT Goals(current goals can now be found in the care plan section)  Progress towards OT goals: Progressing toward goals  Acute Rehab OT Goals Patient Stated Goal: to return home OT Goal Formulation: With patient/family Time For Goal Achievement: 07/04/19 Potential to Achieve Goals: [place] Discharge plan remains appropriate    Co-evaluation    PT/OT/SLP Co-Evaluation/Treatment: Yes Reason for Co-Treatment: For patient/therapist safety;To address functional/ADL transfers   OT goals addressed during session: ADL's and self-care      AM-PAC OT "6 Clicks" Daily Activity     Outcome Measure   Help from another person eating meals?: None Help from another person taking care of personal grooming?: A Little Help from another person toileting, which includes using toliet, bedpan, or urinal?: Total Help from another person bathing (including washing, rinsing, drying)?: A Lot Help from another person to put on and taking off regular upper body clothing?: A Lot Help from another person to put on and taking off regular lower body clothing?: Total 6 Click Score: 13    End of Session Equipment Utilized During Treatment: Oxygen;Other (comment)(4L)  OT Visit Diagnosis: Other abnormalities of gait and mobility (R26.89);Muscle weakness (generalized) (M62.81)   Activity Tolerance Patient tolerated treatment well   Patient Left in bed;with call bell/phone within reach   Nurse Communication          Time: [id number] OT Time Calculation (min): 39 min  Charges: OT General Charges $OT Visit: 1 Visit OT Treatments $Self Care/Home Management : 8-22 mins  Lanier Clam., COTA/L Acute Rehabilitation Services [phone] [place] 06/26/2019, 3:59 PM

## 2019-06-26 NOTE — TOC Progression Note (Signed)
Transition of Care [facility]) - Progression Note    Patient Details  Name: [name] MRN: [id number] Date of Birth: [birth year]  Transition of Care [facility]) CM/SW Contact  Bartholomew Crews, RN Phone Number: [phone] 06/26/2019, 4:37 PM  Clinical Narrative:    Received call from patient spouse, [name] [phone]) who had questions about the bariatric chair that patient would use during outpatient dialysis. Advised that NCM would have to take this question to the renal navigator. [name] stated that patient is agreeable to being hoyered from his scooter to dialysis chair versus having to stand/pivot.   Asked about her approval for wheelchair [name] advised that she should find out about her approval on Thursday next week. Discussed alternative transportation, she advised that patient does have a transportation benefit with his insurance although it is limited, it should be enough until she gets the wheelchair van.  [name] asked about patient being on a MWF schedule to allow her to provide transport to patient, and also allow her to work on the weekend. Renal navigator advised of the request.   TOC team following for transition needs.     Expected Discharge Plan: (Pending PT/OT evals) Barriers to Discharge: Continued Medical Work up  Expected Discharge Plan and Services Expected Discharge Plan: (Pending PT/OT evals)   Discharge Planning Services: CM Consult Post Acute Care Choice: Home Health, Resumption of Svcs/PTA Provider Living arrangements for the past 2 months: [place]: [place]         Social Determinants of Health (SDOH) Interventions    Readmission Risk Interventions Readmission Risk Prevention Plan 06/12/2019 05/10/2019  Transportation Screening Complete Complete  Medication Review Press photographer) Complete Complete  PCP or Specialist appointment within 3-5 days of discharge Complete  -  [place] or Home Care Consult Complete -  SW Recovery Care/Counseling Consult Complete -  Palliative Care Screening Not Applicable Not [place] Not Applicable Not Applicable  Some recent data might be hidden

## 2019-06-26 NOTE — Progress Notes (Signed)
Physical Therapy Treatment Patient Details Name: [name] MRN: [id number] DOB: 1966-03-31 Today's Date: 06/26/2019    History of Present Illness [name] is a 53 y.o. male with medical history of CKD stage IV, bipolar disorder, chronic respiratory failure on 3 L, diabetes mellitus type 2, DVT, OSA/OHS, pulmonary embolus, seizure disorder, thrombocytopenia, hyperlipidemia, chronic pain syndrome presenting with decreased urine output and fall. (Here one month ago for similar complaints. Course complicated due to recent hospitalization for acute on chronic respiratory failure secondary to COVID-19 PNA.    PT Comments    Pt progressing slowly towards physical therapy goals. Somewhat self-limiting at times, however with understandable fear of falling. Wife would like to be present for therapy session, and we have tentatively planned for another session tomorrow afternoon if possible. Pt reports the plan is to return home at d/c but has some concerns with transferring to/from power-chair for outpatient HD. Pt to ask wife to bring power chair tomorrow to practice transfer and ensure pt is safe prior to d/c (which pt reports he hopes is Thursday). Will continue to follow and progress as able per POC.     Follow Up Recommendations  Home health PT;Supervision/Assistance - 24 hour     Equipment Recommendations  Other (comment)(Slide board)    Recommendations for Other Services       Precautions / Restrictions Precautions Precautions: Fall Restrictions Weight Bearing Restrictions: No    Mobility  Bed Mobility Overal bed mobility: Needs Assistance Bed Mobility: Supine to Sit;Sit to Supine     Supine to sit: Mod assist;+2 for physical assistance Sit to supine: Mod assist;+2 for physical assistance   General bed mobility comments: heavy MOD A +2 for bed mobility, able to initiate transitioning BLEs towards EOB but requires MOD A +2 to scoot hips to EOB and elevate  trunk.  Transfers                 General transfer comment: deferred  Ambulation/Gait             General Gait Details: not ambulatory at baseline, transfers to power wheelchair   Stairs             Wheelchair Mobility    Modified Rankin (Stroke Patients Only)       Balance Overall balance assessment: Needs assistance Sitting-balance support: Single extremity supported;Feet supported Sitting balance-Leahy Scale: Fair Sitting balance - Comments: One posterior LOB but able to self correct with cues, min guard for safety Postural control: Posterior lean   Standing balance-Leahy Scale: Zero                              Cognition Arousal/Alertness: Awake/alert Behavior During Therapy: WFL for tasks assessed/performed Overall Cognitive Status: Within Functional Limits for tasks assessed                                        Exercises      General Comments General comments (skin integrity, edema, etc.): pt on 4L O2 with sats WNL      Pertinent Vitals/Pain Pain Assessment: Faces Faces Pain Scale: Hurts little more Pain Location: L Knee with movement Pain Descriptors / Indicators: Grimacing;Discomfort Pain Intervention(s): Limited activity within patient's tolerance;Monitored during session;Repositioned    Home Living  Prior Function            PT Goals (current goals can now be found in the care plan section) Acute Rehab PT Goals Patient Stated Goal: to return home PT Goal Formulation: With patient/family Time For Goal Achievement: 06/27/19 Potential to Achieve Goals: Good Progress towards PT goals: Progressing toward goals    Frequency    Min 2X/week      PT Plan Current plan remains appropriate    Co-evaluation PT/OT/SLP Co-Evaluation/Treatment: Yes Reason for Co-Treatment: Necessary to address cognition/behavior during functional activity;For patient/therapist safety;To  address functional/ADL transfers PT goals addressed during session: Mobility/safety with mobility;Balance OT goals addressed during session: ADL's and self-care      AM-PAC PT "6 Clicks" Mobility   Outcome Measure  Help needed turning from your back to your side while in a flat bed without using bedrails?: A Lot Help needed moving from lying on your back to sitting on the side of a flat bed without using bedrails?: A Lot Help needed moving to and from a bed to a chair (including a wheelchair)?: Total Help needed standing up from a chair using your arms (e.g., wheelchair or bedside chair)?: Total Help needed to walk in hospital room?: Total Help needed climbing 3-5 steps with a railing? : Total 6 Click Score: 8    End of Session Equipment Utilized During Treatment: Oxygen Activity Tolerance: Patient limited by fatigue Patient left: in bed;with bed alarm set;with call bell/phone within reach;with family/visitor present Nurse Communication: Mobility status PT Visit Diagnosis: Muscle weakness (generalized) (M62.81);History of falling (Z91.81);Other abnormalities of gait and mobility (R26.89)     Time: [id number] PT Time Calculation (min) (ACUTE ONLY): 39 min  Charges:  $Therapeutic Activity: 23-37 mins                     Rolinda Roan, PT, DPT Acute Rehabilitation Services Pager: [phone] Office: [phone]    Thelma Comp 06/26/2019, 4:03 PM

## 2019-06-26 NOTE — Procedures (Signed)
I was present at this dialysis session. I have reviewed the session itself and made appropriate changes.   TDC Qb 400 AP ok. Goal UF 4-5L.  BP stable. Hb stable.  AM RFP pending.    Filed Weights   06/25/19 0900 06/25/19 2005 06/26/19 0730  Weight: (!) 196 kg (!) 195.3 kg (!) (P) 195.4 kg    Recent Labs  Lab 06/24/19 0912  NA 133*  K 4.0  CL 98  CO2 26  GLUCOSE 203*  BUN 63*  CREATININE 5.13*  CALCIUM 8.5*  PHOS 5.4*    Recent Labs  Lab 06/24/19 0443 06/25/19 0445 06/26/19 0224  WBC 3.4* 3.5* 3.0*  HGB 8.0* 8.2* 8.2*  HCT 25.7* 25.8* 26.0*  MCV 99.6 97.7 97.4  PLT 80* 78* 80*    Scheduled Meds: . busPIRone  15 mg Oral BID  . calcium acetate  1,334 mg Oral TID WC  . Chlorhexidine Gluconate Cloth  6 each Topical Q0600  . Chlorhexidine Gluconate Cloth  6 each Topical Q0600  . Chlorhexidine Gluconate Cloth  6 each Topical Q0600  . darbepoetin (ARANESP) injection - DIALYSIS  150 mcg Intravenous Q Sat-HD  . DULoxetine  60 mg Oral Daily  . fentaNYL  1 patch Transdermal Q3 days  . finasteride  5 mg Oral Daily  . insulin aspart  0-9 Units Subcutaneous TID WC  . insulin glargine  45 Units Subcutaneous BID  . lamoTRIgine  200 mg Oral BID  . levETIRAcetam  500 mg Oral BID  . pregabalin  50 mg Oral TID  . simvastatin  20 mg Oral QHS  . tamsulosin  0.4 mg Oral Daily  . traZODone  50 mg Oral QHS  . warfarin  7.5 mg Oral ONCE-1800  . Warfarin - Pharmacist Dosing Inpatient   Does not apply q1800   Continuous Infusions: . sodium chloride 250 mL (06/15/19 1130)  . sodium chloride    . heparin 3,300 Units/hr (06/26/19 0431)   PRN Meds:.sodium chloride, acetaminophen **OR** acetaminophen, albuterol, diphenhydrAMINE, fluticasone, HYDROcodone-acetaminophen, ondansetron **OR** ondansetron (ZOFRAN) IV, polyethylene glycol, sodium chloride   Pearson Grippe  MD 06/26/2019, 8:58 AM

## 2019-06-27 DIAGNOSIS — D61818 Other pancytopenia: Secondary | ICD-10-CM

## 2019-06-27 LAB — HEPARIN LEVEL (UNFRACTIONATED): Heparin Unfractionated: 0.23 IU/mL — ABNORMAL LOW (ref 0.30–0.70)

## 2019-06-27 LAB — PROTIME-INR
INR: 2.2 — ABNORMAL HIGH (ref 0.8–1.2)
Prothrombin Time: 24.6 seconds — ABNORMAL HIGH (ref 11.4–15.2)

## 2019-06-27 LAB — CBC
HCT: 24.8 % — ABNORMAL LOW (ref 39.0–52.0)
Hemoglobin: 7.6 g/dL — ABNORMAL LOW (ref 13.0–17.0)
MCH: 30.6 pg (ref 26.0–34.0)
MCHC: 30.6 g/dL (ref 30.0–36.0)
MCV: 100 fL (ref 80.0–100.0)
Platelets: 81 10*3/uL — ABNORMAL LOW (ref 150–400)
RBC: 2.48 MIL/uL — ABNORMAL LOW (ref 4.22–5.81)
RDW: 14.6 % (ref 11.5–15.5)
WBC: 3.2 10*3/uL — ABNORMAL LOW (ref 4.0–10.5)
nRBC: 1.3 % — ABNORMAL HIGH (ref 0.0–0.2)

## 2019-06-27 LAB — GLUCOSE, CAPILLARY
Glucose-Capillary: 151 mg/dL — ABNORMAL HIGH (ref 70–99)
Glucose-Capillary: 158 mg/dL — ABNORMAL HIGH (ref 70–99)
Glucose-Capillary: 222 mg/dL — ABNORMAL HIGH (ref 70–99)
Glucose-Capillary: 202 mg/dL — ABNORMAL HIGH (ref 70–99)

## 2019-06-27 MED ORDER — POLYETHYLENE GLYCOL 3350 17 G PO PACK
17.0000 g | PACK | Freq: Two times a day (BID) | ORAL | Status: DC
  Administered 2019-06-27 – 2019-07-03 (×7): 17 g via ORAL
  Filled 2019-06-27 (×11): qty 1

## 2019-06-27 MED ORDER — SENNA 8.6 MG PO TABS
1.0000 | ORAL_TABLET | Freq: Every day | ORAL | Status: DC
  Administered 2019-06-27 – 2019-07-02 (×6): 8.6 mg via ORAL
  Filled 2019-06-27 (×6): qty 1

## 2019-06-27 MED ORDER — BISACODYL 5 MG PO TBEC: 5.0000 mg | DELAYED_RELEASE_TABLET | Freq: Every day | ORAL | Status: DC | PRN

## 2019-06-27 MED ORDER — WARFARIN SODIUM 5 MG PO TABS
5.0000 mg | ORAL_TABLET | Freq: Once | ORAL | Status: AC
  Administered 2019-06-27: 5 mg via ORAL
  Filled 2019-06-27: qty 1

## 2019-06-27 NOTE — Progress Notes (Signed)
ANTICOAGULATION CONSULT NOTE - Follow-Up Consult  Pharmacy Consult for heparin IV >> warfarin Indication: h/o pulmonary embolus and DVT  Patient Measurements: Height: 6\' 3"  (190.5 cm) Weight: (!) 430 lb 1.9 oz (195.1 kg) IBW/kg (Calculated) : 84.5 Heparin Dosing Weight: 131.1 kg  Vital Signs: Temp: 97.7 F (36.5 C) (12/29 0426) Temp Source: Oral (12/29 0426) BP: 102/47 (12/29 0426) Pulse Rate: 71 (12/29 0426)  Labs:  Recent Labs    06/19/19 1513 06/20/19 0504 06/20/19 1413 06/21/19 0424 06/21/19 0835 06/22/19 0430 06/22/19 0918  HGB   < > 7.4*  --  9.1* 8.2* 8.5*  --   HCT   < > 23.6*  --  29.8* 26.7* 27.3*  --   PLT   < > 83*  --  80* 86* 85*  --   LABPROT  --  15.5*  --  15.6*  --  15.2  --   INR  --  1.2  --  1.3*  --  1.2  --   HEPARINUNFRC   < > <0.10*  --  0.52  --  0.17* 0.26*  CREATININE  --   --  3.84*  --  4.40* 3.54*  --    < > = values in this interval not displayed.    Estimated Creatinine Clearance: 30.7 mL/min (A) (by C-G formula based on SCr of 5.13 mg/dL (H)).   Assessment: 53 yr old male with a PMH significant for multiple DVT/PE, on warfarin PTA. Warfarin was held due to gross hematuria on admit (S/P 5 unit RBCs).   On heparin 12/21 >> 12/29.  Warfarin resumed 12/22 - INR therapeutic.  No acute clot so 24 hour overlap with therapeutic heparin not needed.  Will d/c heparin.  Goal of Therapy:  INR 2-3 Monitor platelets by anticoagulation protocol: Yes   Plan:  Discontinue heparin and associated labs. Warfarin 5mg  PO x 1 tonight Daily INR  [employer], Pharm.D., BCPS Clinical Pharmacist Clinical phone for 06/27/2019 from 8:30-4:00 is [phone].  **Pharmacist phone directory can be found on [email or web address] listed under [place].  06/27/2019 1:44 PM

## 2019-06-27 NOTE — Plan of Care (Signed)
  Problem: Clinical Measurements: Goal: Ability to maintain clinical measurements within normal limits will improve Outcome: Progressing   

## 2019-06-27 NOTE — TOC Progression Note (Signed)
Transition of Care [facility]) - Progression Note    Patient Details  Name: [name] MRN: [id number] Date of Birth: September 03, 1965  Transition of Care [facility]) CM/SW Contact  Bartholomew Crews, RN Phone Number: [phone] 06/27/2019, 4:19 PM  Clinical Narrative:    Spoke with patient and wife at the bedside. Discussed needs for DME - bariatric hoyer lift. Patient will also need DME - bariatric slide board and an upgraded DME - oxygen order to reflect current needs for 4 L. Renal navigator working on outpatient hemodialysis at [facility]. Wife requesting MWF schedule if possible which will allow her to work weekends and be available to transport patient during the week. She understands that patient may possible get wait listed for MWF spot. Wife is pending approval for wheelchair [name] to transport patient to/from dialysis. In the interim patient has medical transportation through his insurance if needed. Patient will need PTAR transport at discharge. TOC team following for transition needs.    Expected Discharge Plan: (Pending PT/OT evals) Barriers to Discharge: Continued Medical Work up  Expected Discharge Plan and Services Expected Discharge Plan: (Pending PT/OT evals)   Discharge Planning Services: CM Consult Post Acute Care Choice: Home Health, Resumption of Svcs/PTA Provider Living arrangements for the past 2 months: [place]: [place]         Social Determinants of Health (SDOH) Interventions    Readmission Risk Interventions Readmission Risk Prevention Plan 06/12/2019 05/10/2019  Transportation Screening Complete Complete  Medication Review Press photographer) Complete Complete  PCP or Specialist appointment within 3-5 days of discharge Complete -  [place] or Home Care Consult Complete -  SW Recovery Care/Counseling Consult Complete -  Palliative Care Screening Not Applicable Not [place] Not  Applicable Not Applicable  Some recent data might be hidden

## 2019-06-27 NOTE — Progress Notes (Signed)
Occupational Therapy Treatment Patient Details Name: [name] MRN: [id number] DOB: 1965/07/05 Today's Date: 06/27/2019    History of present illness [name] is a 53 y.o. male with medical history of CKD stage IV, bipolar disorder, chronic respiratory failure on 3 L, diabetes mellitus type 2, DVT, OSA/OHS, pulmonary embolus, seizure disorder, thrombocytopenia, hyperlipidemia, chronic pain syndrome presenting with decreased urine output and fall. (Here one month ago for similar complaints. Course complicated due to recent hospitalization for acute on chronic respiratory failure secondary to COVID-19 PNA.   OT comments   Session focus on transfer training from EOB<>PWC with slide board. Pt required heavy MOD A +2 to place board and scoot hips across slide board. Discussed recommendation of using hoyer lift at OP HD center as pt would require increased assist to complete safe transfer. Pt continues to decline CIR or SNF placement, although pt would benefit from continued inpatient therapy to maximize functional independence before returning home. Will continue to follow acutely per POC.    Follow Up Recommendations  Home health OT;Supervision/Assistance - 24 hour(pt declining rehab)    Equipment Recommendations  Hospital bed;Other (comment)(hoyer lift)    Recommendations for Other Services      Precautions / Restrictions Precautions Precautions: Fall Restrictions Weight Bearing Restrictions: No       Mobility Bed Mobility Overal bed mobility: Needs Assistance Bed Mobility: Supine to Sit;Sit to Supine;Rolling Rolling: +2 for physical assistance;Mod assist   Supine to sit: Mod assist;+2 for physical assistance Sit to supine: Mod assist;+2 for physical assistance   General bed mobility comments: heavy MOD A +2 for bed mobility, able to initiate transitioning BLEs towards EOB but requires MOD A +2 to scoot hips to EOB and elevate trunk.  Transfers Overall transfer  level: Needs assistance Equipment used: Sliding board Transfers: Lateral/Scoot Transfers          Lateral/Scoot Transfers: Mod assist;+2 physical assistance General transfer comment: Heavy +2 assist for transition to/from power chair with slide board for assist. Increased time required and VC's for sequencing and safety provided. Overall very effortfull.     Balance Overall balance assessment: Needs assistance Sitting-balance support: Single extremity supported;Feet supported Sitting balance-Leahy Scale: Fair Sitting balance - Comments: One posterior LOB but able to self correct with cues, min guard for safety Postural control: Posterior lean   Standing balance-Leahy Scale: Zero                             ADL either performed or assessed with clinical judgement   ADL Overall ADL's : Needs assistance/impaired                     Lower Body Dressing: Total assistance;+2 for physical assistance;Bed level Lower Body Dressing Details (indicate cue type and reason): to don shoes Toilet Transfer: Transfer board;Moderate assistance;+2 for physical assistance;+2 for safety/equipment Toilet Transfer Details (indicate cue type and reason): simualted from EOB<>PWC, MOD A +2 to place board and assist with scooting hips         Functional mobility during ADLs: Moderate assistance;+2 for physical assistance;+2 for safety/equipment(EOB<>PWC with transfer board) General ADL Comments: session focus on transfer training from EOB<>PWC with transfer board     Vision   Vision Assessment?: No apparent visual deficits   Perception     Praxis      Cognition Arousal/Alertness: Awake/alert Behavior During Therapy: WFL for tasks assessed/performed Overall Cognitive Status: Within Functional Limits for  tasks assessed                                          Exercises     Shoulder Instructions       General Comments pts wife present during session, pt  on 4L O2 with no c/o SOB    Pertinent Vitals/ Pain       Pain Assessment: Faces Faces Pain Scale: Hurts even more Pain Location: L knee, hip with mobility Pain Descriptors / Indicators: Grimacing;Discomfort Pain Intervention(s): Limited activity within patient's tolerance;Monitored during session;Repositioned  Home Living                                          Prior Functioning/Environment              Frequency  Min 2X/week        Progress Toward Goals  OT Goals(current goals can now be found in the care plan section)  Progress towards OT goals: Progressing toward goals  Acute Rehab OT Goals Patient Stated Goal: to return home OT Goal Formulation: With patient/family Time For Goal Achievement: 07/04/19 Potential to Achieve Goals: [place] Discharge plan remains appropriate    Co-evaluation      Reason for Co-Treatment: For patient/therapist safety;To address functional/ADL transfers;Complexity of the patient's impairments (multi-system involvement) PT goals addressed during session: Mobility/safety with mobility;Balance;Proper use of DME        AM-PAC OT "6 Clicks" Daily Activity     Outcome Measure   Help from another person eating meals?: None Help from another person taking care of personal grooming?: A Little Help from another person toileting, which includes using toliet, bedpan, or urinal?: Total Help from another person bathing (including washing, rinsing, drying)?: A Lot Help from another person to put on and taking off regular upper body clothing?: A Lot Help from another person to put on and taking off regular lower body clothing?: Total 6 Click Score: 13    End of Session Equipment Utilized During Treatment: Other (comment);Oxygen(4L O2; transfer board)  OT Visit Diagnosis: Other abnormalities of gait and mobility (R26.89);Muscle weakness (generalized) (M62.81)   Activity Tolerance Patient tolerated treatment well    Patient Left in bed;with call bell/phone within reach;with family/visitor present   Nurse Communication          Time: [id number] OT Time Calculation (min): 50 min  Charges: OT General Charges $OT Visit: 1 Visit OT Treatments $Therapeutic Activity: 8-22 mins  Lanier Clam., COTA/L Acute Rehabilitation Services [phone] [place] 06/27/2019, 4:15 PM

## 2019-06-27 NOTE — Progress Notes (Signed)
Physical Therapy Treatment Patient Details Name: [name] MRN: [id number] DOB: [birth year] Today's Date: 06/27/2019    History of Present Illness [name] is a 53 y.o. male with medical history of CKD stage IV, bipolar disorder, chronic respiratory failure on 3 L, diabetes mellitus type 2, DVT, OSA/OHS, pulmonary embolus, seizure disorder, thrombocytopenia, hyperlipidemia, chronic pain syndrome presenting with decreased urine output and fall. (Here one month ago for similar complaints. Course complicated due to recent hospitalization for acute on chronic respiratory failure secondary to COVID-19 PNA.    PT Comments    Focus of session was to assess functional transfers to/from pt's power chair to determine safety with transfers at outpatient HD upon d/c. Pt requiring heavy +2 assist for safe transition to/from power chair with slide board. At this time I do not feel it is in the patient's best interest to rely on this method of transfers in HD. Discussed with Renal Coordinator and feel utilizing a Hoyer Lift at the outpatient HD center would be a safer option. Also discussed with pt and wife the ongoing recommendation for more intensive rehab prior to return home to maximize functional independence and safety, however pt continues to refuse. Updated [name] recommendations to make it clear we are recommending further skilled therapy for safety, however I do understand that pt is refusing and wants to return home with HHPT to follow up.    Follow Up Recommendations  Supervision/Assistance - 24 hour;CIR     Equipment Recommendations  Slide board, Hoyer lift   Recommendations for Other Services       Precautions / Restrictions Precautions Precautions: Fall Restrictions Weight Bearing Restrictions: No    Mobility  Bed Mobility Overal bed mobility: Needs Assistance Bed Mobility: Supine to Sit;Sit to Supine Rolling: +2 for physical assistance;Mod assist   Supine to sit: Mod  assist;+2 for physical assistance Sit to supine: Mod assist;+2 for physical assistance   General bed mobility comments: heavy MOD A +2 for bed mobility, able to initiate transitioning BLEs towards EOB but requires MOD A +2 to scoot hips to EOB and elevate trunk.  Transfers Overall transfer level: Needs assistance Equipment used: Sliding board Transfers: Lateral/Scoot Transfers          Lateral/Scoot Transfers: Mod assist;+2 physical assistance General transfer comment: Heavy +2 assist for transition to/from power chair with slide board for assist. Increased time required and VC's for sequencing and safety provided. Overall very effortfull.   Ambulation/Gait             General Gait Details: not ambulatory at baseline, transfers to power wheelchair   Stairs             Wheelchair Mobility    Modified Rankin (Stroke Patients Only)       Balance Overall balance assessment: Needs assistance Sitting-balance support: Single extremity supported;Feet supported Sitting balance-Leahy Scale: Fair Sitting balance - Comments: One posterior LOB but able to self correct with cues, min guard for safety Postural control: Posterior lean                                  Cognition Arousal/Alertness: Awake/alert Behavior During Therapy: WFL for tasks assessed/performed Overall Cognitive Status: Within Functional Limits for tasks assessed  Exercises      General Comments        Pertinent Vitals/Pain Pain Assessment: Faces Faces Pain Scale: Hurts even more Pain Location: L knee, hip with mobility Pain Descriptors / Indicators: Grimacing;Discomfort Pain Intervention(s): Limited activity within patient's tolerance;Monitored during session;Repositioned    Home Living                      Prior Function            PT Goals (current goals can now be found in the care plan section) Acute Rehab  PT Goals Patient Stated Goal: to return home PT Goal Formulation: With patient/family Time For Goal Achievement: 06/27/19 Potential to Achieve Goals: Good Progress towards PT goals: Progressing toward goals    Frequency    Min 3X/week      PT Plan Frequency needs to be updated    Co-evaluation PT/OT/SLP Co-Evaluation/Treatment: Yes Reason for Co-Treatment: For patient/therapist safety;To address functional/ADL transfers;Complexity of the patient's impairments (multi-system involvement) PT goals addressed during session: Mobility/safety with mobility;Balance;Proper use of DME        AM-PAC PT "6 Clicks" Mobility   Outcome Measure  Help needed turning from your back to your side while in a flat bed without using bedrails?: A Lot Help needed moving from lying on your back to sitting on the side of a flat bed without using bedrails?: A Lot Help needed moving to and from a bed to a chair (including a wheelchair)?: Total Help needed standing up from a chair using your arms (e.g., wheelchair or bedside chair)?: Total Help needed to walk in hospital room?: Total Help needed climbing 3-5 steps with a railing? : Total 6 Click Score: 8    End of Session Equipment Utilized During Treatment: Oxygen Activity Tolerance: Patient tolerated treatment well Patient left: in bed;with call bell/phone within reach;with family/visitor present Nurse Communication: Mobility status PT Visit Diagnosis: Muscle weakness (generalized) (M62.81);History of falling (Z91.81);Other abnormalities of gait and mobility (R26.89)     Time: [id number] PT Time Calculation (min) (ACUTE ONLY): 57 min  Charges:  $Therapeutic Activity: 23-37 mins                     Rolinda Roan, PT, DPT Acute Rehabilitation Services Pager: [phone] Office: [phone]    Thelma Comp 06/27/2019, 3:27 PM

## 2019-06-27 NOTE — Progress Notes (Signed)
Admit: 06/11/2019 LOS: 23  38M new ESRD, OSA on CPAP, morbid obesity, hx/o PE, DM, HTN, BPAD  Subjective:  . HD yesterday 4.5L UF . Workign with PT, but nto sure he can transfer from powerchair to HD chair?  12/28 0701 - 12/29 0700 In: 722.3 [P.O.:220; I.V.:502.3] Out: 4825 [Urine:325]  Filed Weights   06/25/19 2005 06/26/19 0730 06/26/19 2108  Weight: (!) 195.3 kg (!) 195.4 kg (!) 195.1 kg    Scheduled Meds: . busPIRone  15 mg Oral BID  . calcium acetate  1,334 mg Oral TID WC  . Chlorhexidine Gluconate Cloth  6 each Topical Q0600  . darbepoetin (ARANESP) injection - DIALYSIS  150 mcg Intravenous Q Sat-HD  . DULoxetine  60 mg Oral Daily  . fentaNYL  1 patch Transdermal Q3 days  . finasteride  5 mg Oral Daily  . hydrocortisone cream   Topical BID  . insulin aspart  0-9 Units Subcutaneous TID WC  . insulin glargine  45 Units Subcutaneous BID  . lamoTRIgine  200 mg Oral BID  . levETIRAcetam  500 mg Oral BID  . pregabalin  50 mg Oral TID  . simvastatin  20 mg Oral QHS  . tamsulosin  0.4 mg Oral Daily  . traZODone  50 mg Oral QHS  . Warfarin - Pharmacist Dosing Inpatient   Does not apply q1800   Continuous Infusions: . sodium chloride 250 mL (06/15/19 1130)  . sodium chloride    . heparin 3,300 Units/hr (06/26/19 2131)   PRN Meds:.sodium chloride, acetaminophen **OR** acetaminophen, albuterol, diphenhydrAMINE, fluticasone, heparin, HYDROcodone-acetaminophen, ondansetron **OR** ondansetron (ZOFRAN) IV, polyethylene glycol, sodium chloride  Current Labs: reviewed  Results for [name], [name] (MRN [id number]) as of 06/27/2019 12:47  Ref. Range 06/06/2019 11:00  Saturation Ratios Latest Ref Range: 17.9 - 39.5 % 16 (L)  Ferritin Latest Ref Range: 24 - 336 ng/mL 70    Physical Exam:  Blood pressure (!) 102/47, pulse 71, temperature 97.7 F (36.5 C), temperature source Oral, resp. rate 16, height 6\' 3"  (1.905 m), weight (!) 195.1 kg, SpO2 95 %. Morbidly obese in  bed Regular LUE AVF +B/T R IJ TDC c/d/i, bandaged Regular, no apprec rub 2+ LEE  A 1. New ESRD, s/p AVF LUE BC 12/21 and R IJ TDC 12/23; Start HD 12/17, needs CLIP to [facility].  Cont HD MWF schedule 2. Gross hematuria: s/p uro eval 3. Anemia: s/p Fe and on ESA, CTM 4. AMS, improved 5. CKD-BMD: PHosLo, PTH ok, CTM 6. OSA on CPAP 7. Obesity 8. Debility, needs aggressive PT/OT and need to see if can successfully transfer from powerchair to HD   P . HD tomorrow: 4h, TDC, 4-5L UF, 400/800, 3K, Tight heparin . F/u PT and OT . Medication Issues; o Preferred narcotic agents for pain control are hydromorphone, fentanyl, and methadone. Morphine should not be used.  o Baclofen should be avoided o Avoid oral sodium phosphate and magnesium citrate based laxatives / bowel preps    Pearson Grippe MD 06/27/2019, 12:46 PM  Recent Labs  Lab 06/20/19 1413 06/21/19 0835 06/22/19 0430 06/24/19 0912  NA 136 138 137 133*  K 4.0 4.1 3.9 4.0  CL 99 99 98 98  CO2 29 30 29 26   GLUCOSE 159* 152* 142* 203*  BUN 30* 43* 29* 63*  CREATININE 3.84* 4.40* 3.54* 5.13*  CALCIUM 8.6* 8.7* 8.8* 8.5*  PHOS 4.3 6.3*  --  5.4*   Recent Labs  Lab 06/25/19 0445 06/26/19 0224 06/27/19 0550  WBC 3.5* 3.0* 3.2*  HGB 8.2* 8.2* 7.6*  HCT 25.8* 26.0* 24.8*  MCV 97.7 97.4 100.0  PLT 78* 80* 81*

## 2019-06-27 NOTE — Progress Notes (Signed)
PROGRESS NOTE  [name] [id number] DOB: 04/03/1966 DOA: 06/11/2019 PCP: Sharion Balloon, FNP  Brief History   53 year old man PMH CKD stage V, chronic respiratory failure 3 L nasal cannula, OSA, OHS, diabetes mellitus type 2, pulmonary embolism, DVT, Covid pneumonia October 11-October 27, hospitalization December 6-December 10 for UTI with gross hematuria, complicated pyelonephritis, acute on chronic CKD, presented with inability to void any pain, creatinine 9.42 and transferred to [facility] for hemodialysis.  A & P  New ESRD, initially AKI superimposed on CKD stage V.  Status post AV fistula 12/21, right IJ tunneled dialysis catheter 12/23, started on HD 12/17. --Continue management per nephrology, MWF schedule, CLIP --Avoid morphine, baclofen, sodium phosphate, magnesium citrate based laxatives  Gross hematuria, with associated acute blood loss anemia, resolved.  Hemorrhagic right kidney cyst on CT scan.  Seen by urology 12/17, felt to have no urgent need for cystoscopy given improvement.  Patient poor candidate for embolization versus nephrectomy. --Continue conservative management with Foley catheter and finasteride as recommended by Dr. Alyson Ingles.  PMH DVT, PE on warfarin as an outpatient.  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 --Continue warfarin per pharmacy protocol.  INR now therapeutic.  Heparin stopped.  Diabetes mellitus type 2 --CBG remains stable.  Continue Lantus, sliding scale insulin, simvastatin  Anemia of CKD, anemia of acute blood loss.  Status post total 5 units PRBC during hospitalization. --Hemoglobin stable  Pancytopenia, stable. --Etiology unclear.  Follow-up as an outpatient.  Chronic respiratory failure on home oxygen 3-4 L nasal cannula, OSA, OHS,  --Appears stable.  Mood disorder, bipolar disorder --Stable.  Continue BuSpar, Cymbalta, Lamictal, Keppra, trazodone  Seizure disorder --Appears stable.  Continue Keppra,  Lamictal  Chronic pain syndrome --Stable.  Continue fentanyl patch  Morbid obesity. Body mass index is 53.76 kg/m.   Debility, wheelchair-bound at baseline --PT  Left arm rash --Nonspecific, resolving with hydrocortisone cream.  Mild confusion.  Not encephalopathic.  Doubt UTI.  Will check urinalysis.  Will if develops urinary symptoms though however will not treat.  If condition were to worsen, will hold Lyrica, BuSpar, Cymbalta   Resolved Hospital Problem list    Acute metabolic encephalopathy, multifactorial including uremia, multiple narcotics in the setting of worsening kidney dysfunction and poor renal clearance.  Improved while holding BuSpar, Cymbalta, Lyrica, narcotics, HD   DVT prophylaxis: Heparin infusion, warfarin Code Status: Full Family Communication: Wife at bedside Disposition Plan: home with PT, OT   Murray Hodgkins, MD  [facility] Direct contact: see [email or web address] (further directions at bottom of note if needed) 7PM-7AM contact night coverage as at bottom of note 06/27/2019, 2:52 PM  LOS: 16 days   Significant Hospital Events   . 12/13 admitted to [facility] . 12/15 transferred to [facility] for hemodialysis . 12/16 tunneled dialysis catheter insertion . 12/17 hemodialysis initiated . 12/21 left AV fistula   Consults:  . Nephrology . Vascular surgery . Interventional radiology . Urology   Procedures:  .   Significant Diagnostic Tests:  Marland Kitchen    Micro Data:  .    Antimicrobials:  .   Interval History/Subjective  Rash is better today on left arm.  Wifeseems to be confused at times.  Patient endorses this as well.  No pain in suprapubic area but wonders if he might have a UTI causing his confusion.  Eating well.  Objective   Vitals:  Vitals:   06/26/19 2108 06/27/19 0426  BP: (!) 105/42 (!) 102/47  Pulse: 72 71  Resp: 18 16  Temp: 99 F (37.2 C) 97.7 F (36.5 C)  SpO2: 99% 95%    Exam:  Constitutional.  Appears  calm, comfortable. Psychiatric.  Grossly normal mood and affect.  Speech fluent and appropriate.  Oriented to self, location, month, year.  He does fall asleep easily during examination but easily awakes. Respiratory.  Clear to auscultation bilaterally.  No wheezes, rales or rhonchi.  Normal respiratory effort. Cardiovascular.  Regular rate and rhythm.  No murmur, rub or gallop.  No definite lower extremity edema. Skin.  Macular papular rash on the left upper extremity is resolving.  PA-C surgical incision healing well.  I have personally reviewed the following:   Today's Data  . CBG stable. . Hemoglobin stable at 7.6.  Platelets stable at 81.  WBC stable at 3.2.  INR 2.2.  Scheduled Meds: . busPIRone  15 mg Oral BID  . calcium acetate  1,334 mg Oral TID WC  . Chlorhexidine Gluconate Cloth  6 each Topical Q0600  . darbepoetin (ARANESP) injection - DIALYSIS  150 mcg Intravenous Q Sat-HD  . DULoxetine  60 mg Oral Daily  . fentaNYL  1 patch Transdermal Q3 days  . finasteride  5 mg Oral Daily  . hydrocortisone cream   Topical BID  . insulin aspart  0-9 Units Subcutaneous TID WC  . insulin glargine  45 Units Subcutaneous BID  . lamoTRIgine  200 mg Oral BID  . levETIRAcetam  500 mg Oral BID  . polyethylene glycol  17 g Oral BID  . pregabalin  50 mg Oral TID  . senna  1 tablet Oral QHS  . simvastatin  20 mg Oral QHS  . tamsulosin  0.4 mg Oral Daily  . traZODone  50 mg Oral QHS  . warfarin  5 mg Oral ONCE-1800  . Warfarin - Pharmacist Dosing Inpatient   Does not apply q1800   Continuous Infusions: . sodium chloride 250 mL (06/15/19 1130)  . sodium chloride      Principal Problem:   ESRD (end stage renal disease) ([place]) Active Problems:   Morbid obesity (HCC)   OSA (obstructive sleep apnea)   Obesity hypoventilation syndrome (HCC)   DM2 (diabetes mellitus, type 2) (HCC)   History of pulmonary embolism   Chronic anticoagulation   Seizure disorder (HCC)   Anemia    Thrombocytopenia (HCC)   Chronic pain syndrome   History of DVT (deep vein thrombosis)   Hyperlipidemia associated with type 2 diabetes mellitus ([place])   Depression   Acute-on-chronic kidney injury ([place])   CKD (chronic kidney disease), stage V ([place])   LOS: 16 days   How to contact the [facility] Attending or Consulting provider 7A - 7P or covering provider during after hours 7P -7A, for this patient?  1. Check the care team in [facility] and look for a) attending/consulting TRH provider listed and b) the [facility] team listed 2. Log into [email or web address] and use [place]'s universal password to access. If you do not have the password, please contact the hospital operator. 3. Locate the [facility] provider you are looking for under [facility] and page to a number that you can be directly reached. 4. If you still have difficulty reaching the provider, please page the [facility] (Director on Call) for the Hospitalists listed on amion for assistance.

## 2019-06-28 LAB — URINALYSIS, ROUTINE W REFLEX MICROSCOPIC
Bilirubin Urine: NEGATIVE
Glucose, UA: NEGATIVE mg/dL
Ketones, ur: NEGATIVE mg/dL
Nitrite: NEGATIVE
Protein, ur: 100 mg/dL — AB
RBC / HPF: >50 RBC/hpf — ABNORMAL HIGH (ref 0–5)
Specific Gravity, Urine: 1.016 (ref 1.005–1.030)
WBC, UA: >50 WBC/hpf — ABNORMAL HIGH (ref 0–5)
pH: 5 (ref 5.0–8.0)

## 2019-06-28 LAB — RENAL FUNCTION PANEL
Albumin: 2.4 g/dL — ABNORMAL LOW (ref 3.5–5.0)
Anion gap: 6 (ref 5–15)
BUN: 46 mg/dL — ABNORMAL HIGH (ref 6–20)
CO2: 28 mmol/L (ref 22–32)
Calcium: 8.9 mg/dL (ref 8.9–10.3)
Chloride: 97 mmol/L — ABNORMAL LOW (ref 98–111)
Creatinine, Ser: 4.92 mg/dL — ABNORMAL HIGH (ref 0.61–1.24)
GFR calc Af Amer: 15 mL/min — ABNORMAL LOW (ref >60)
GFR calc non Af Amer: 13 mL/min — ABNORMAL LOW (ref >60)
Glucose, Bld: 183 mg/dL — ABNORMAL HIGH (ref 70–99)
Phosphorus: 4.6 mg/dL (ref 2.5–4.6)
Potassium: 4.5 mmol/L (ref 3.5–5.1)
Sodium: 131 mmol/L — ABNORMAL LOW (ref 135–145)

## 2019-06-28 LAB — GLUCOSE, CAPILLARY
Glucose-Capillary: 191 mg/dL — ABNORMAL HIGH (ref 70–99)
Glucose-Capillary: 189 mg/dL — ABNORMAL HIGH (ref 70–99)
Glucose-Capillary: 124 mg/dL — ABNORMAL HIGH (ref 70–99)
Glucose-Capillary: 211 mg/dL — ABNORMAL HIGH (ref 70–99)

## 2019-06-28 LAB — CBC
HCT: 25.6 % — ABNORMAL LOW (ref 39.0–52.0)
Hemoglobin: 7.6 g/dL — ABNORMAL LOW (ref 13.0–17.0)
MCH: 30.5 pg (ref 26.0–34.0)
MCHC: 29.7 g/dL — ABNORMAL LOW (ref 30.0–36.0)
MCV: 102.8 fL — ABNORMAL HIGH (ref 80.0–100.0)
Platelets: 86 10*3/uL — ABNORMAL LOW (ref 150–400)
RBC: 2.49 MIL/uL — ABNORMAL LOW (ref 4.22–5.81)
RDW: 14.8 % (ref 11.5–15.5)
WBC: 3.4 10*3/uL — ABNORMAL LOW (ref 4.0–10.5)
nRBC: 0.9 % — ABNORMAL HIGH (ref 0.0–0.2)

## 2019-06-28 LAB — PROTIME-INR
INR: 2.4 — ABNORMAL HIGH (ref 0.8–1.2)
Prothrombin Time: 26 seconds — ABNORMAL HIGH (ref 11.4–15.2)

## 2019-06-28 MED ORDER — HEPARIN SODIUM (PORCINE) 1000 UNIT/ML IJ SOLN
INTRAMUSCULAR | Status: AC
  Filled 2019-06-28: qty 4

## 2019-06-28 MED ORDER — WARFARIN SODIUM 5 MG PO TABS
5.0000 mg | ORAL_TABLET | Freq: Once | ORAL | Status: AC
  Administered 2019-06-28: 5 mg via ORAL
  Filled 2019-06-28: qty 1

## 2019-06-28 MED ORDER — PREGABALIN 50 MG PO CAPS
50.0000 mg | ORAL_CAPSULE | Freq: Two times a day (BID) | ORAL | Status: DC
  Administered 2019-06-28 – 2019-07-03 (×10): 50 mg via ORAL
  Filled 2019-06-28 (×10): qty 1

## 2019-06-28 MED ORDER — HEPARIN SODIUM (PORCINE) 1000 UNIT/ML DIALYSIS
20.0000 [IU]/kg | INTRAMUSCULAR | Status: DC | PRN
  Administered 2019-06-28: 3900 [IU] via INTRAVENOUS_CENTRAL

## 2019-06-28 MED ORDER — HEPARIN SODIUM (PORCINE) 1000 UNIT/ML IJ SOLN
INTRAMUSCULAR | Status: AC
  Administered 2019-06-28: 3800 [IU]
  Filled 2019-06-28: qty 4

## 2019-06-28 MED ORDER — DULOXETINE HCL 30 MG PO CPEP
30.0000 mg | ORAL_CAPSULE | Freq: Every day | ORAL | Status: DC
  Administered 2019-06-29 – 2019-07-03 (×5): 30 mg via ORAL
  Filled 2019-06-28 (×5): qty 1

## 2019-06-28 NOTE — Plan of Care (Signed)
  Problem: Clinical Measurements: Goal: Ability to maintain clinical measurements within normal limits will improve Outcome: Progressing   

## 2019-06-28 NOTE — Progress Notes (Signed)
OP HD referral was switched from [facility] to [place] HD clinic yesterday with [facility] Admission Coordinator. Navigator received call from [place] clinic manager today to discuss. She is not sure she has staff to accommodate patient at this time or that the [place] at SW is for larger patients than the one at [facility] is. Renal Navigator was under the impression that it is from previous conversations. Renal Navigator contacted A. Ware/Director of Operations to discuss. She will evaluate and follow up with Navigator. Renal Navigator appreciates the efforts of [facility] to accommodate this patient and keep him with his current MD practice. Renal Navigator will follow closely.  Alphonzo Cruise, [place] Renal Navigator [phone]

## 2019-06-28 NOTE — TOC Progression Note (Signed)
Transition of Care [facility]) - Progression Note    Patient Details  Name: [name] MRN: [id number] Date of Birth: 12-06-1965  Transition of [facility]) CM/SW Contact  Bartholomew Crews, RN Phone Number: [phone] 06/28/2019, 4:57 PM  Clinical Narrative:    Spoke with patient and spouse at the bedside. [employer] to deliver bariatric equipment tomorrow. Spouse was approved for wheelchair [name] and will be able to provide transport for patient to dialysis. Patient wanting to continue [facility] PT at discharge. Alvis Lemmings unable to accept new referral for this patient at this time. Noted renal navigator's ongoing efforts to coordinate outpatient dialysis. TOC team following for transition needs.    Expected Discharge Plan: (Pending PT/OT evals) Barriers to Discharge: Continued Medical Work up  Expected Discharge Plan and Services Expected Discharge Plan: (Pending PT/OT evals)   Discharge Planning Services: CM Consult Post Acute Care Choice: Home Health, Resumption of Svcs/PTA Provider Living arrangements for the past 2 months: [place]: [place]         Social Determinants of Health (SDOH) Interventions    Readmission Risk Interventions Readmission Risk Prevention Plan 06/12/2019 05/10/2019  Transportation Screening Complete Complete  Medication Review Press photographer) Complete Complete  PCP or Specialist appointment within 3-5 days of discharge Complete -  [place] or Home Care Consult Complete -  SW Recovery Care/Counseling Consult Complete -  Palliative Care Screening Not Applicable Not [place] Not Applicable Not Applicable  Some recent data might be hidden

## 2019-06-28 NOTE — Progress Notes (Addendum)
PROGRESS NOTE  [name] [id number] DOB: 1966/05/15 DOA: 06/11/2019 PCP: Sharion Balloon, FNP  Brief History   58 WM CKD stage V, chronic respiratory failure 3 L nasal cannula, OSA, OHS, diabetes mellitus type 2, pulmonary embolism, DVT, Covid pneumonia October 11-October 27, hospitalization December 6-December 10 for UTI with gross hematuria, complicated pyelonephritis, acute on chronic CKD, presented with inability to void w/o pain, creatinine 9.42 and transferred to [facility] for hemodialysis.  A & P  New ESRD, initially AKI superimposed on CKD stage V.  Status post AV fistula 12/21, right IJ tunneled dialysis catheter 12/23, started on HD 12/17. --Continue management per nephrology, MWF schedule, CLIP --Avoid morphine, baclofen, sodium phosphate, magnesium citrate based laxatives  Gross hematuria, with associated acute blood loss anemia, resolved.  Hemorrhagic right kidney cyst on CT scan.  -- Seen by urology 12/17, felt to have no urgent need for cystoscopy given improvement.  Patient poor candidate for embolization versus nephrectomy. --Continue conservative management with Foley catheter and finasteride as recommended by Dr. Alyson Ingles.  PMH DVT, PE on warfarin as an outpatient.  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 --Bridged to Coumadin off of heparin INR 2.4  Diabetes mellitus type 2 --CBG trend is one 89-1 91.  Continue Lantus 45 units twice daily, sensitive sliding scale  Anemia of CKD, anemia of acute blood loss.  Status post total 5 units PRBC during hospitalization. --Hemoglobin low 7-8 range  Pancytopenia, stable. --Etiology unclear.  Follow-up as an outpatient.  Chronic respiratory failure on home oxygen 3-4 L nasal cannula, OSA, OHS,  --Appears stable.  Mood disorder, bipolar disorder --Stable.  Continue BuSpar, Cymbalta, Lamictal, Keppra, trazodone --I have cut back the dose of some of these meds on 12/30 secondary to tremors  12/30  Seizure disorder --Appears stable.  Continue Keppra, Lamictal  Chronic pain syndrome --Stable.  Continue fentanyl patch  Morbid obesity. Body mass index is 53.76 kg/m.   Debility, wheelchair-bound at baseline --PT recommending that patient get home health OT PT and recommending CIR which she is refusing with slide board in order  Left arm rash --Nonspecific, resolving with hydrocortisone cream.  Mild confusion.  Not encephalopathic.  Doubt UTI.  Urine analysis 12/30 - for infection if condition were to worsen, will hold Lyrica, BuSpar, Cymbalta   Resolved Hospital Problem list    Acute metabolic encephalopathy, multifactorial including uremia, multiple narcotics in the setting of worsening kidney dysfunction and poor renal clearance.  Improved while holding BuSpar, Cymbalta, Lyrica, narcotics, HD   DVT prophylaxis: Heparin infusion, warfarin Code Status: Full Family Communication: Wife at bedside Disposition Plan: home with PT, OT   Verneita Griffes, MD [facility] 1:05 PM  06/28/2019, 1:02 PM  LOS: 17 days   Significant Hospital Events   . 12/13 admitted to [facility] . 12/15 transferred to [facility] for hemodialysis . 12/16 tunneled dialysis catheter insertion . 12/17 hemodialysis initiated . 12/21 left AV fistula   Consults:  . Nephrology . Vascular surgery . Interventional radiology . Urology   Interval History/Subjective  Awake alert seen on HD unit Some tremors-feels this occurs when he moves hands etc. No abvd pain no fever etc  Objective   Vitals:  Vitals:   06/28/19 0550 06/28/19 0849  BP: (!) 112/48 (!) 115/50  Pulse: 68 69  Resp: 16 18  Temp: 98 F (36.7 C) 98.2 F (36.8 C)  SpO2: 95% 96%    Exam: eomi ncat no ict no pallor Very thick neck  mallampatti 3 cta b no added sound s1 s2 no m/r/g abd extremly obes eno rebound no guard Some denudation of skin and bruising to LE's  I have personally reviewed the  following:   Today's Data  .   Scheduled Meds: . busPIRone  15 mg Oral BID  . calcium acetate  1,334 mg Oral TID WC  . Chlorhexidine Gluconate Cloth  6 each Topical Q0600  . darbepoetin (ARANESP) injection - DIALYSIS  150 mcg Intravenous Q Sat-HD  . DULoxetine  60 mg Oral Daily  . fentaNYL  1 patch Transdermal Q3 days  . finasteride  5 mg Oral Daily  . heparin      . hydrocortisone cream   Topical BID  . insulin aspart  0-9 Units Subcutaneous TID WC  . insulin glargine  45 Units Subcutaneous BID  . lamoTRIgine  200 mg Oral BID  . levETIRAcetam  500 mg Oral BID  . polyethylene glycol  17 g Oral BID  . pregabalin  50 mg Oral TID  . senna  1 tablet Oral QHS  . simvastatin  20 mg Oral QHS  . tamsulosin  0.4 mg Oral Daily  . traZODone  50 mg Oral QHS  . warfarin  5 mg Oral ONCE-1800  . Warfarin - Pharmacist Dosing Inpatient   Does not apply q1800   Continuous Infusions: . sodium chloride 250 mL (06/15/19 1130)  . sodium chloride      Principal Problem:   ESRD (end stage renal disease) ([place]) Active Problems:   Morbid obesity (HCC)   OSA (obstructive sleep apnea)   Obesity hypoventilation syndrome (HCC)   DM2 (diabetes mellitus, type 2) (HCC)   History of pulmonary embolism   Chronic anticoagulation   Seizure disorder (HCC)   Anemia   Thrombocytopenia (HCC)   Chronic pain syndrome   History of DVT (deep vein thrombosis)   Hyperlipidemia associated with type 2 diabetes mellitus (HCC)   Depression   Pancytopenia (HCC)   Acute-on-chronic kidney injury ([place])   CKD (chronic kidney disease), stage V ([place])   LOS: 17 days   Verneita Griffes, MD [facility] 3:10 PM   How to contact the [facility] Attending or Consulting provider 7A - 7P or covering provider during after hours 7P -7A, for this patient?  1. Check the care team in [facility] and look for a) attending/consulting TRH provider listed and b) the [facility] team listed 2. Log into [email or web address] and use [employer] [facility]'s universal  password to access. If you do not have the password, please contact the hospital operator. 3. Locate the [facility] provider you are looking for under [facility] and page to a number that you can be directly reached. 4. If you still have difficulty reaching the provider, please page the [facility] (Director on Call) for the Hospitalists listed on amion for assistance.

## 2019-06-28 NOTE — Progress Notes (Signed)
Renal Navigator met with patient and wife at bedside to discuss OP HD referral and concerns with transfers at HD center and recommendation for referral to OP HD center with Harrel Lemon life that can accommodate patient's size instead of relying on patient to be able to transfer himself from power chair to HD chair and back 3x per week. Navigator is worried this will be an unsafe plan, at least with patient's refusal to follow PT/OT recommendation for rehab after hospital discharge.  Patient and wife agree that a center with a suitable Harrel Lemon lift will be the safest option. Renal Navigator explained to them that Navigator is aware that the [facility] facilities in [place] and [place] have [place] lifts that accommodate up to 600 lbs. Patient inquired as to whether he could go to HD on a stretcher and Navigator informed him that there is no center where he will be able to receive HD on a stretcher. He states he is understanding and accepting of that. Patient asked if he is able to continue with his Nephrologist/Dr. Moshe Cipro if he is to be referred to a [place] clinic. Navigator explained, that unfortunately, a referral to a [place] clinic would mean changing MD practices. Patient does not want to do this. He states that he has been with Dr. Moshe Cipro for 10 years and has a close MD/patient relationship with her that is meaningful for him to keep. Renal Navigator stated understanding and asked further about their transportation. Of note, patient's wife appears extremely supportive. They report that they will be purchasing a [name] with a lift from a "family friend" once they receive a loan from their bank next Thursday. It will take place on that date because that is when the bank will see wife's paycheck get deposited in the bank where she will be getting the loan. Patient's wife is confident that she will be approved for the loan. Navigator explained that hospital discharge plans sometimes happen within 24 hours or less and  asked what the plan might be if patient is ready for discharge before next Thursday, suggesting the idea that maybe they could ask to borrow the [name] until they receive the loan to purchase, since it is coming from a friend. They both think they might be able to put a down payment on the [name] now in order to use it immediately and will ask the friend. Patient has Medicare transportation benefits, but these need to be scheduled with 3-5 days notice.  Renal Navigator asked how patient's wife feels about bring patient to [facility] for OP HD treatment. She replied that she will do "whatever he needs." Navigator explained that [facility] OP HD clinic in [place] has a [employer] fast streamer that accommodates 600lbs, but that Navigator will need to see if they have a seat available. Navigator added that they will need to commit to the drive since it is imperative that patient attends OP HD three times per week. Patient and wife reside in [place]. Wife mapped it on her phone from their home and it is 31 minutes away. When [facility] was mapped, this was 26 minutes away.  Renal Navigator sent message to Dr. Elta Guadeloupe Director of [facility] OP HD clinic, who is willing to accept the patient if there is a seat time available fort him. Renal Navigator will follow up.  Alphonzo Cruise, [place] Renal Navigator [phone]

## 2019-06-28 NOTE — Progress Notes (Addendum)
ANTICOAGULATION CONSULT NOTE - Follow Up Consult  Pharmacy Consult for Warfarin Indication: h/o pulmonary embolus and DVT  Allergies  Allergen Reactions  . Bee Venom Anaphylaxis, Shortness Of Breath and Swelling  . Other Shortness Of Breath    Itching, rash with IVP DYE, iodine, shellfish LATEX  . Shellfish Allergy Nausea And Vomiting and Other (See Comments)    Feels like insides are twisting  . Iodinated Diagnostic Agents     Other reaction(s): RASH  . Iohexol      Code: RASH, Desc: PT WAS ON PREDNISONE FOR GOUT TX. @ TIME OF SCAN AND RECEIVED 50 MG OF BENADRYL IV-ARS 10/08/07   . Iodine Rash  . Latex Rash    Patient Measurements: Height: 6' 3" (190.5 cm) Weight: (!) 430 lb 1.9 oz (195.1 kg) IBW/kg (Calculated) : 84.5 Heparin Dosing Weight: 132 kg  Vital Signs: Temp: 98 F (36.7 C) (12/30 0550) Temp Source: Oral (12/30 0550) BP: 112/48 (12/30 0550) Pulse Rate: 68 (12/30 0550)  Labs: Recent Labs    06/26/19 0224 06/26/19 1639 06/27/19 0550 06/28/19 0502  HGB 8.2*  --  7.6*  --   HCT 26.0*  --  24.8*  --   PLT 80*  --  81*  --   LABPROT 20.4*  --  24.6* 26.0*  INR 1.8*  --  2.2* 2.4*  HEPARINUNFRC 0.25* 0.33 0.23*  --     Estimated Creatinine Clearance: 30.7 mL/min (A) (by C-G formula based on SCr of 5.13 mg/dL (H)).   Medications:  Medications Prior to Admission  Medication Sig Dispense Refill Last Dose  . [EXPIRED] amoxicillin-clavulanate (AUGMENTIN) 500-125 MG tablet Take 1 tablet (500 mg total) by mouth 2 (two) times daily for 7 days. 14 tablet 0 06/11/2019 at Unknown time  . busPIRone (BUSPAR) 15 MG tablet TAKE 1 TABLET (15 MG TOTAL) BY MOUTH 2 (TWO) TIMES DAILY. (Patient taking differently: Take 15 mg by mouth 2 (two) times daily. TAKE 1 TABLET (15 MG TOTAL) BY MOUTH 2 (TWO) TIMES DAILY.) 180 tablet 0 06/11/2019 at Unknown time  . calcitRIOL (ROCALTROL) 0.25 MCG capsule Take 0.25 mcg by mouth daily.   Past Week at Unknown time  . cetirizine (ZYRTEC) 10  MG tablet Take 1 tablet (10 mg total) by mouth daily. 30 tablet 11 Past Week at Unknown time  . clomiPHENE (CLOMID) 50 MG tablet Take 0.5 tablets (25 mg total) by mouth every other day. 24 tablet 3 Past Week at Unknown time  . docusate sodium (COLACE) 100 MG capsule Take 1 capsule (100 mg total) by mouth 2 (two) times daily. 10 capsule 0 Past Week at Unknown time  . DULoxetine (CYMBALTA) 60 MG capsule TAKE 1 CAPSULE BY MOUTH EVERY DAY (Patient taking differently: Take 60 mg by mouth daily. ) 90 capsule 0 Past Week at Unknown time  . EMGALITY 120 MG/ML SOAJ Inject 120 mg as directed every 30 (thirty) days.    Past Week at Unknown time  . esomeprazole (NEXIUM) 40 MG capsule TAKE 1 CAPSULE (40 MG TOTAL) BY MOUTH DAILY AT 12 NOON. 90 capsule 0 Past Week at Unknown time  . fentaNYL (DURAGESIC) 50 MCG/HR Place 1 patch onto the skin every 3 (three) days.   06/11/2019 at Unknown time  . finasteride (PROSCAR) 5 MG tablet Take 1 tablet (5 mg total) by mouth daily. 30 tablet 1 06/11/2019 at Unknown time  . fluticasone (FLONASE) 50 MCG/ACT nasal spray PLACE 1 SPRAY INTO BOTH NOSTRILS 2 (TWO) TIMES DAILY AS NEEDED  FOR ALLERGIES. 48 g 4 Past Week at Unknown time  . Insulin Glargine, 1 Unit Dial, (TOUJEO SOLOSTAR) 300 UNIT/ML SOPN Inject 60 Units into the skin 2 (two) times daily. (Patient taking differently: Inject 200 Units into the skin daily. ) 130 pen 3 06/11/2019 at Unknown time  . insulin lispro (HUMALOG) 100 UNIT/ML KwikPen Inject 0.25 mLs (25 Units total) into the skin 3 (three) times daily. (Patient taking differently: Inject 50 Units into the skin 3 (three) times daily. ) 45 mL 0 06/11/2019 at Unknown time  . lamoTRIgine (LAMICTAL) 200 MG tablet TAKE 1 TABLET BY MOUTH TWICE A DAY (Patient taking differently: Take 200 mg by mouth 2 (two) times daily. ) 180 tablet 0 06/11/2019 at Unknown time  . levETIRAcetam (KEPPRA) 1000 MG tablet Take 1 tablet (1,000 mg total) by mouth 2 (two) times daily. 180 tablet 3  06/10/2019 at Unknown time  . pregabalin (LYRICA) 50 MG capsule TAKE 1 CAPSULE BY MOUTH THREE TIMES A DAY (Patient taking differently: Take 50 mg by mouth 3 (three) times daily. TAKE 1 CAPSULE BY MOUTH THREE TIMES A DAY) 270 capsule 1 06/11/2019 at Unknown time  . PROAIR HFA 108 (90 Base) MCG/ACT inhaler TAKE 2 PUFFS BY MOUTH EVERY 6 HOURS AS NEEDED FOR WHEEZE OR SHORTNESS OF BREATH (Patient taking differently: Inhale 2 puffs into the lungs every 6 (six) hours as needed for wheezing or shortness of breath. ) 18 g 0 06/11/2019 at Unknown time  . rizatriptan (MAXALT) 10 MG tablet May repeat in 2 hours if needed (Patient taking differently: Take 10 mg by mouth as needed. May repeat in 2 hours if needed) 10 tablet 11 Past Week at Unknown time  . simvastatin (ZOCOR) 20 MG tablet Take 1 tablet (20 mg total) by mouth at bedtime. 90 tablet 3 06/10/2019 at Unknown time  . tamsulosin (FLOMAX) 0.4 MG CAPS capsule TAKE 1 CAPSULE BY MOUTH EVERY DAY (Patient taking differently: Take 0.4 mg by mouth daily. TAKE 1 CAPSULE BY MOUTH EVERY DAY) 90 capsule 0 06/11/2019 at Unknown time  . TRULICITY 1.5 LP/3.7TK SOPN Inject 1.5 mg as directed once a week.    Past Week at Unknown time  . vitamin C (VITAMIN C) 500 MG tablet Take 1 tablet (500 mg total) by mouth daily.   06/11/2019 at Unknown time  . Vitamin D, Ergocalciferol, (DRISDOL) 1.25 MG (50000 UT) CAPS capsule Take 50,000 Units by mouth once a week.   06/11/2019 at Unknown time  . warfarin (COUMADIN) 5 MG tablet Take 1 tablet (5 mg total) by mouth daily. Take 64m daily 90 tablet 2 06/10/2019 at 1700  . zinc sulfate 220 (50 Zn) MG capsule Take 1 capsule (220 mg total) by mouth 2 (two) times daily.   06/11/2019 at Unknown time  . EPIPEN 2-PAK 0.3 MG/0.3ML SOAJ injection INJECT 0.3 MLS (0.3 MG TOTAL) INTO THE MUSCLE ONCE. AS NEEDED FOR ANAPHYLACTIC REACTION (Patient taking differently: Inject 0.3 mg into the muscle as needed. ) 2 Device 2   . furosemide (LASIX) 80 MG tablet  Take 1 tablet (80 mg total) by mouth 2 (two) times daily. Hold until follow up with nephrology     . HYDROcodone-acetaminophen (NORCO) 7.5-325 MG tablet Take 1 tablet by mouth every 6 (six) hours as needed for moderate pain. 90 tablet 0   . NARCAN 4 MG/0.1ML LIQD nasal spray kit Place 1 spray into the nose once.       Scheduled:  . busPIRone  15 mg Oral  BID  . calcium acetate  1,334 mg Oral TID WC  . Chlorhexidine Gluconate Cloth  6 each Topical Q0600  . darbepoetin (ARANESP) injection - DIALYSIS  150 mcg Intravenous Q Sat-HD  . DULoxetine  60 mg Oral Daily  . fentaNYL  1 patch Transdermal Q3 days  . finasteride  5 mg Oral Daily  . hydrocortisone cream   Topical BID  . insulin aspart  0-9 Units Subcutaneous TID WC  . insulin glargine  45 Units Subcutaneous BID  . lamoTRIgine  200 mg Oral BID  . levETIRAcetam  500 mg Oral BID  . polyethylene glycol  17 g Oral BID  . pregabalin  50 mg Oral TID  . senna  1 tablet Oral QHS  . simvastatin  20 mg Oral QHS  . tamsulosin  0.4 mg Oral Daily  . traZODone  50 mg Oral QHS  . Warfarin - Pharmacist Dosing Inpatient   Does not apply q1800    Assessment: 53 yr old male amitted on 06/10/19 with a PMH significant for multiple DVT/PE, on warfarin PTA. 5 mg daily (LD pta taken 06/10/19. INR was therapeutic on admit date.  Warfarin was held on admission due to gross hematuria. S/P 5 unit RBCs during this hospitalization.   On IV heparin 12/21 >>12/29. Warfarin resumed 12/22 - INR was therapeutic on 12/28.  No acute clot so 24 hour overlap with therapeutic thus heparin not needed.  Heparin infusion discontinued 12/29.  Today INR = 2.4 remains therapeutic.   Hgb 7.6 , pltc 81 on 12/28. Thrombocytopenia at baseline.   No bleeding reported.   Goal of Therapy:  INR 2-3 Monitor platelets by anticoagulation protocol: Yes   Plan:  Warfarin 5 mg PO x 1 tonight Daily INR Monitor CBC in AM  Thank you for allowing pharmacy to be part of this patients  care team.  Nicole Cella, RPh Clinical Pharmacist Please check AMION for all [place] phone numbers After 10:00 PM, call University [phone] 06/28/2019,9:09 AM

## 2019-06-28 NOTE — Progress Notes (Signed)
Admit: 06/11/2019 LOS: 10  29M new ESRD, OSA on CPAP, morbid obesity, hx/o PE, DM, HTN, BPAD  Subjective:  . Plan is to CLIP to [facility], where [place] can handle his weight . Refusing CIR, encouraged him to reconsider . HD today  12/29 0701 - 12/30 0700 In: 2074 [P.O.:2074] Out: [place] Weights   06/25/19 2005 06/26/19 0730 06/26/19 2108  Weight: (!) 195.3 kg (!) 195.4 kg (!) 195.1 kg    Scheduled Meds: . busPIRone  15 mg Oral BID  . calcium acetate  1,334 mg Oral TID WC  . Chlorhexidine Gluconate Cloth  6 each Topical Q0600  . darbepoetin (ARANESP) injection - DIALYSIS  150 mcg Intravenous Q Sat-HD  . DULoxetine  60 mg Oral Daily  . fentaNYL  1 patch Transdermal Q3 days  . finasteride  5 mg Oral Daily  . heparin      . hydrocortisone cream   Topical BID  . insulin aspart  0-9 Units Subcutaneous TID WC  . insulin glargine  45 Units Subcutaneous BID  . lamoTRIgine  200 mg Oral BID  . levETIRAcetam  500 mg Oral BID  . polyethylene glycol  17 g Oral BID  . pregabalin  50 mg Oral TID  . senna  1 tablet Oral QHS  . simvastatin  20 mg Oral QHS  . tamsulosin  0.4 mg Oral Daily  . traZODone  50 mg Oral QHS  . warfarin  5 mg Oral ONCE-1800  . Warfarin - Pharmacist Dosing Inpatient   Does not apply q1800   Continuous Infusions: . sodium chloride 250 mL (06/15/19 1130)  . sodium chloride     PRN Meds:.sodium chloride, acetaminophen **OR** acetaminophen, albuterol, bisacodyl, diphenhydrAMINE, fluticasone, heparin, heparin, HYDROcodone-acetaminophen, ondansetron **OR** ondansetron (ZOFRAN) IV, sodium chloride  Current Labs: reviewed  Results for JAHMEIR, HUSTER (MRN [id number]) as of 06/27/2019 12:47  Ref. Range 06/06/2019 11:00  Saturation Ratios Latest Ref Range: 17.9 - 39.5 % 16 (L)  Ferritin Latest Ref Range: 24 - 336 ng/mL 70    Physical Exam:  Blood pressure (!) 115/50, pulse 69, temperature 98.2 F (36.8 C), temperature source Oral, resp. rate 18,  height 6\' 3"  (1.905 m), weight (!) 195.1 kg, SpO2 96 %. Morbidly obese in bed Regular LUE AVF +B/T R IJ TDC c/d/i, bandaged Regular, no apprec rub 2+ LEE  A 1. New ESRD, s/p AVF LUE BC 12/21 and R IJ [facility] 12/23; Start HD 12/17, needs CLIP to [facility] in process.  Cont HD MWF schedule 2. Gross hematuria: s/p uro eval 3. Anemia: s/p Fe and on ESA, CTM 4. AMS, improved 5. CKD-BMD: PHosLo, PTH ok, CTM 6. OSA on CPAP 7. Obesity 8. Hx/o PE/DVT, on warfarin 9. Debility, needs aggressive PT/OT and need to see if can successfully transfer from powerchair to HD -- will need Harrel Lemon, which is available at [facility] . HD today: 4h, TDC, 4-5L UF, 400/800, 3K, Tight heparin . CLIP in process . Medication Issues; o Preferred narcotic agents for pain control are hydromorphone, fentanyl, and methadone. Morphine should not be used.  o Baclofen should be avoided o Avoid oral sodium phosphate and magnesium citrate based laxatives / bowel preps    Pearson Grippe MD 06/28/2019, 12:58 PM  Recent Labs  Lab 06/22/19 0430 06/24/19 0912  NA 137 133*  K 3.9 4.0  CL 98 98  CO2 29 26  GLUCOSE 142* 203*  BUN 29* 63*  CREATININE 3.54* 5.13*  CALCIUM  8.8* 8.5*  PHOS  --  5.4*   Recent Labs  Lab 06/25/19 0445 06/26/19 0224 06/27/19 0550  WBC 3.5* 3.0* 3.2*  HGB 8.2* 8.2* 7.6*  HCT 25.8* 26.0* 24.8*  MCV 97.7 97.4 100.0  PLT 78* 80* 81*

## 2019-06-29 LAB — CBC
HCT: 25.8 % — ABNORMAL LOW (ref 39.0–52.0)
Hemoglobin: 7.9 g/dL — ABNORMAL LOW (ref 13.0–17.0)
MCH: 31.1 pg (ref 26.0–34.0)
MCHC: 30.6 g/dL (ref 30.0–36.0)
MCV: 101.6 fL — ABNORMAL HIGH (ref 80.0–100.0)
Platelets: 88 10*3/uL — ABNORMAL LOW (ref 150–400)
RBC: 2.54 MIL/uL — ABNORMAL LOW (ref 4.22–5.81)
RDW: 15 % (ref 11.5–15.5)
WBC: 3.3 10*3/uL — ABNORMAL LOW (ref 4.0–10.5)
nRBC: 0 % (ref 0.0–0.2)

## 2019-06-29 LAB — GLUCOSE, CAPILLARY
Glucose-Capillary: 171 mg/dL — ABNORMAL HIGH (ref 70–99)
Glucose-Capillary: 158 mg/dL — ABNORMAL HIGH (ref 70–99)
Glucose-Capillary: 131 mg/dL — ABNORMAL HIGH (ref 70–99)
Glucose-Capillary: 210 mg/dL — ABNORMAL HIGH (ref 70–99)

## 2019-06-29 LAB — PROTIME-INR
INR: 2.7 — ABNORMAL HIGH (ref 0.8–1.2)
Prothrombin Time: 28.4 seconds — ABNORMAL HIGH (ref 11.4–15.2)

## 2019-06-29 MED ORDER — WARFARIN SODIUM 5 MG PO TABS
5.0000 mg | ORAL_TABLET | Freq: Once | ORAL | Status: AC
  Administered 2019-06-29: 5 mg via ORAL
  Filled 2019-06-29: qty 1

## 2019-06-29 MED ORDER — DARBEPOETIN ALFA 150 MCG/0.3ML IJ SOSY
150.0000 ug | PREFILLED_SYRINGE | INTRAMUSCULAR | Status: DC
  Filled 2019-06-29: qty 0.3

## 2019-06-29 NOTE — Progress Notes (Signed)
Patient ID: [name], male   DOB: Jul 28, 1965, 53 y.o.   MRN: [id number]  PROGRESS NOTE    [name]  [id number] DOB: Apr 08, 1966 DOA: 06/11/2019 PCP: Sharion Balloon, FNP   Brief Narrative:  53 year old male with history of chronic kidney disease stage V, chronic hypoxic respiratory failure on 3 L nasal cannula oxygen at home, OSA, OHS, diabetes mellitus type 2, pulmonary embolism, DVT, Covid pneumonia from 04/09/2019-04/25/2019, hospitalization from 06/04/2019-06/08/2023 UTI with gross hematuria, complicated pyelonephritis, acute on chronic CKD presented with inability to void along with pain and was found to have creatinine of 5.42.  He was transferred to [facility] for hemodialysis.  During the hospitalization, he has been started on hemodialysis.  PT is recommending CIR placement but patient refuses it.  He is awaiting outpatient hemodialysis arrangement.  Assessment & Plan:    End-stage renal disease; Initially AKI superimposed on his chronic renal disease stage V -Status post AV fistula placement on 06/19/2019.  Right IJ tunneled dialysis catheter on 06/20/2019 -Started HD on 06/15/2019.  Nephrology following.  Outpatient hemodialysis unit is being arranged. -Avoid morphine, baclofen, sodium phosphate, magnesium citrate-based laxative  Gross hematuria with associated acute blood loss anemia: Resolved Hemorrhagic right kidney cyst on CT scan -Seen by urology on 06/15/2019.  Felt to have no need for urgent cystoscopy evaluation given improvement.  Patient poor candidate for embolization versus nephrectomy -Continue conservative management with Foley catheter and finasteride as recommended by Dr. Alyson Ingles.  Outpatient follow-up with urology.  History of DVT/PE on warfarin as an outpatient -Continue Coumadin as per pharmacy.  INR now therapeutic.  Heparin stopped  Diabetes mellitus type 2 uncontrolled with hyperglycemia -Continue Lantus along with CBGs with  SSI.  Anemia of chronic disease/acute blood loss anemia -Status post 5 units packed red cells during the hospitalization.  Currently hemoglobin stable.  Transfuse if hemoglobin is less than 7  Pancytopenia -Stable.  etiology unclear.  Monitor  Chronic hypoxic respiratory failure on 3 to 4 L oxygen via nasal cannula OSA OHS -Stable.  Continue oxygen supplementation  Mood disorder/bipolar disorder -Stable.  Continue BuSpar, Cymbalta, Lamictal, Keppra and trazodone  Seizure disorder -Continue Keppra and Lamictal  Chronic pain syndrome -Stable.  Continue fentanyl patch  Morbid obesity -Outpatient follow-up  Debility, wheelchair-bound at baseline -Overall prognosis is guarded to poor.  Will request medical evaluation for goals of care.  PT recommended CIR.  Patient refused CIR  Left arm rash -Nonspecific.  Resolving with hydrocortisone cream  Mild confusion -Monitor mental status.  Currently stable.  If mental status worsens, might consider holding Lyrica, BuSpar and Cymbalta   DVT prophylaxis: Coumadin Code Status: Full Family Communication: Spoke to patient at bedside Disposition Plan: Refusing CIR.  Home with PT/OT once outpatient hemodialysis unit has been arranged  Consultants: Nephrology/vascular surgery/IR/urology  Procedures:  AV fistula placement on 06/19/2019.  Right IJ tunneled dialysis catheter on 06/20/2019 -Started HD on 06/15/2019  Antimicrobials:  Anti-infectives (From admission, onward)   Start     Dose/Rate Route Frequency Ordered Stop   06/21/19 1257  ceFAZolin (ANCEF) 2-4 GM/100ML-% IVPB    Note to Pharmacy: Rudene Re   : cabinet override      06/21/19 1257 06/21/19 1611   06/21/19 0000  ceFAZolin (ANCEF) IVPB 2g/100 mL premix     2 g 200 mL/hr over 30 Minutes Intravenous To Radiology 06/20/19 1348 06/21/19 1337   06/19/19 0600  cefUROXime (ZINACEF) 1.5 g in sodium chloride 0.9 % 100 mL IVPB  1.5 g 200 mL/hr over 30 Minutes Intravenous On  call to O.R. 06/18/19 0959 06/19/19 1145   06/11/19 2200  amoxicillin-clavulanate (AUGMENTIN) 500-125 MG per tablet 500 mg  Status:  Discontinued     1 tablet Oral 2 times daily 06/11/19 2114 06/14/19 0135       Subjective: Patient seen and examined at bedside.  Poor historian.  Sleepy, wakes up slightly on calling his name.  Denies any worsening shortness of breath, fever or abdominal pain.  Objective: Vitals:   06/28/19 1609 06/28/19 2105 06/29/19 0503 06/29/19 0940  BP: (!) 101/47 (!) 116/43 (!) 122/52 (!) 115/56  Pulse: 70 67 68 68  Resp: 16 18 16 18   Temp: 98.1 F (36.7 C) 98.7 F (37.1 C) 99 F (37.2 C) 98.4 F (36.9 C)  TempSrc: Oral Oral Oral Oral  SpO2: 95% 95% 93% 95%  Weight:  (!) 193.2 kg    Height:        Intake/Output Summary (Last 24 hours) at 06/29/2019 1057 Last data filed at 06/29/2019 0900 Gross per 24 hour  Intake 720 ml  Output 2781 ml  Net -2061 ml   Filed Weights   06/26/19 2108 06/28/19 1200 06/28/19 2105  Weight: (!) 195.1 kg (!) 198 kg (!) 193.2 kg    Examination:  General exam: Appears calm and comfortable.  Looks chronically ill and older than stated age.  No distress.  Sleepy, wakes up slightly calling his name.  Poor historian Respiratory system: Bilateral decreased breath sounds at bases with some scattered crackles Cardiovascular system: S1 & S2 heard, Rate controlled Gastrointestinal system: Abdomen is morbidly obese, nondistended, soft and nontender. Normal bowel sounds heard. Extremities: No cyanosis, clubbing; trace lower extremity edema    Data Reviewed: I have personally reviewed following labs and imaging studies  CBC: Recent Labs  Lab 06/25/19 0445 06/26/19 0224 06/27/19 0550 06/28/19 1212 06/29/19 0607  WBC 3.5* 3.0* 3.2* 3.4* 3.3*  HGB 8.2* 8.2* 7.6* 7.6* 7.9*  HCT 25.8* 26.0* 24.8* 25.6* 25.8*  MCV 97.7 97.4 100.0 102.8* 101.6*  PLT 78* 80* 81* 86* 88*   Basic Metabolic Panel: Recent Labs  Lab 06/24/19 0912  06/28/19 1212  NA 133* 131*  K 4.0 4.5  CL 98 97*  CO2 26 28  GLUCOSE 203* 183*  BUN 63* 46*  CREATININE 5.13* 4.92*  CALCIUM 8.5* 8.9  PHOS 5.4* 4.6   GFR: Estimated Creatinine Clearance: 31.8 mL/min (A) (by C-G formula based on SCr of 4.92 mg/dL (H)). Liver Function Tests: Recent Labs  Lab 06/24/19 0912 06/28/19 1212  ALBUMIN 2.4* 2.4*   No results for input(s): LIPASE, AMYLASE in the last 168 hours. No results for input(s): AMMONIA in the last 168 hours. Coagulation Profile: Recent Labs  Lab 06/25/19 0445 06/26/19 0224 06/27/19 0550 06/28/19 0502 06/29/19 0607  INR 1.5* 1.8* 2.2* 2.4* 2.7*   Cardiac Enzymes: No results for input(s): CKTOTAL, CKMB, CKMBINDEX, TROPONINI in the last 168 hours. BNP (last 3 results) No results for input(s): PROBNP in the last 8760 hours. HbA1C: No results for input(s): HGBA1C in the last 72 hours. CBG: Recent Labs  Lab 06/28/19 0653 06/28/19 1121 06/28/19 1630 06/28/19 2102 06/29/19 0648  GLUCAP 191* 189* 124* 211* 171*   Lipid Profile: No results for input(s): CHOL, HDL, LDLCALC, TRIG, CHOLHDL, LDLDIRECT in the last 72 hours. Thyroid Function Tests: No results for input(s): TSH, T4TOTAL, FREET4, T3FREE, THYROIDAB in the last 72 hours. Anemia Panel: No results for input(s): VITAMINB12, FOLATE, FERRITIN, TIBC,  IRON, RETICCTPCT in the last 72 hours. Sepsis Labs: No results for input(s): PROCALCITON, LATICACIDVEN in the last 168 hours.  No results found for this or any previous visit (from the past 240 hour(s)).       Radiology Studies: No results found.      Scheduled Meds: . busPIRone  15 mg Oral BID  . calcium acetate  1,334 mg Oral TID WC  . Chlorhexidine Gluconate Cloth  6 each Topical Q0600  . [START ON 06/30/2019] darbepoetin (ARANESP) injection - DIALYSIS  150 mcg Intravenous Q Fri-HD  . DULoxetine  30 mg Oral Daily  . fentaNYL  1 patch Transdermal Q3 days  . finasteride  5 mg Oral Daily  .  hydrocortisone cream   Topical BID  . insulin aspart  0-9 Units Subcutaneous TID WC  . insulin glargine  45 Units Subcutaneous BID  . lamoTRIgine  200 mg Oral BID  . polyethylene glycol  17 g Oral BID  . pregabalin  50 mg Oral BID  . senna  1 tablet Oral QHS  . simvastatin  20 mg Oral QHS  . tamsulosin  0.4 mg Oral Daily  . traZODone  50 mg Oral QHS  . warfarin  5 mg Oral ONCE-1800  . Warfarin - Pharmacist Dosing Inpatient   Does not apply q1800   Continuous Infusions: . sodium chloride 250 mL (06/15/19 1130)  . sodium chloride            Aline August, MD [facility] 06/29/2019, 10:57 AM

## 2019-06-29 NOTE — Progress Notes (Signed)
   [facility] Progress Note    Assessment/ Plan:   1. New ESRD, s/p AVF LUE BC 12/21 and R IJ [facility] 12/23; Start HD 12/17, needs CLIP to [facility] in process.  Cont HD MWF schedule, next planned for tomorrow 06/30/2019 2. Gross hematuria: s/p uro eval 3. Anemia: s/p Fe and on ESA, CTM 4. AMS, improved 5. CKD-BMD: PHosLo, PTH ok, CTM 6. OSA on CPAP 7. Obesity 8. Hx/o PE/DVT, on warfarin 9. Debility, needs aggressive PT/OT and need to see if can successfully transfer from powerchair to HD -- will need Hoyer up to 600 lbs  Subjective:    Sleeping, no complaints.  Awaiting dispo plan with [facility] lift.  HD yesterday   Objective:   BP (!) 115/56 (BP Location: Right Wrist)   Pulse 68   Temp 98.4 F (36.9 C) (Oral)   Resp 18   Ht 6\' 3"  (1.905 m)   Wt (!) 193.2 kg   SpO2 95%   BMI 53.25 kg/m   Intake/Output Summary (Last 24 hours) at 06/29/2019 1205 Last data filed at 06/29/2019 0900 Gross per 24 hour  Intake 720 ml  Output 2781 ml  Net -2061 ml   Weight change:   Physical Exam: Gen: NAD, obese CVS: RRR Resp: on O2, nonlabored Abd: obese Ext: 1+ LE edema  Imaging: No results found.  Labs: BMET Recent Labs  Lab 06/24/19 0912 06/28/19 1212  NA 133* 131*  K 4.0 4.5  CL 98 97*  CO2 26 28  GLUCOSE 203* 183*  BUN 63* 46*  CREATININE 5.13* 4.92*  CALCIUM 8.5* 8.9  PHOS 5.4* 4.6   CBC Recent Labs  Lab 06/26/19 0224 06/27/19 0550 06/28/19 1212 06/29/19 0607  WBC 3.0* 3.2* 3.4* 3.3*  HGB 8.2* 7.6* 7.6* 7.9*  HCT 26.0* 24.8* 25.6* 25.8*  MCV 97.4 100.0 102.8* 101.6*  PLT 80* 81* 86* 88*    Medications:    . busPIRone  15 mg Oral BID  . calcium acetate  1,334 mg Oral TID WC  . Chlorhexidine Gluconate Cloth  6 each Topical Q0600  . [START ON 06/30/2019] darbepoetin (ARANESP) injection - DIALYSIS  150 mcg Intravenous Q Fri-HD  . DULoxetine  30 mg Oral Daily  . fentaNYL  1 patch Transdermal Q3 days  . finasteride  5 mg Oral Daily  . hydrocortisone  cream   Topical BID  . insulin aspart  0-9 Units Subcutaneous TID WC  . insulin glargine  45 Units Subcutaneous BID  . lamoTRIgine  200 mg Oral BID  . polyethylene glycol  17 g Oral BID  . pregabalin  50 mg Oral BID  . senna  1 tablet Oral QHS  . simvastatin  20 mg Oral QHS  . tamsulosin  0.4 mg Oral Daily  . traZODone  50 mg Oral QHS  . warfarin  5 mg Oral ONCE-1800  . Warfarin - Pharmacist Dosing Inpatient   Does not apply q1800      Madelon Lips, MD 06/29/2019, 12:05 PM

## 2019-06-29 NOTE — Plan of Care (Signed)

## 2019-06-29 NOTE — TOC Progression Note (Addendum)
Transition of Care [facility]) - Progression Note    Patient Details  Name: OTNIEL PHENG MRN: [id number] Date of Birth: 1966/03/30  Transition of Care [facility] CM/SW Contact  Bartholomew Crews, RN Phone Number: [phone] 06/29/2019, 4:52 PM  Clinical Narrative:    Referral sent to [place] for PT - accepted. Spoke with patient and spouse at bedside and advised of [place] agency. Patient will need HH orders for PT with Face to Face at discharge.   DME has been delivered to the home - bed, hoyer, slide board, and upgraded oxygen concentrator.   Pending set up for outpatient hemodialysis.  TOC team following for transition needs.    Expected Discharge Plan: (Pending PT/OT evals) Barriers to Discharge: Continued Medical Work up  Expected Discharge Plan and Services Expected Discharge Plan: (Pending PT/OT evals)   Discharge Planning Services: CM Consult Post Acute Care Choice: Home Health, Resumption of Svcs/PTA Provider Living arrangements for the past 2 months: [place]: [place]         Social Determinants of Health (SDOH) Interventions    Readmission Risk Interventions Readmission Risk Prevention Plan 06/12/2019 05/10/2019  Transportation Screening Complete Complete  Medication Review Press photographer) Complete Complete  PCP or Specialist appointment within 3-5 days of discharge Complete -  [place] or Home Care Consult Complete -  SW Recovery Care/Counseling Consult Complete -  Palliative Care Screening Not Applicable Not [place] Not Applicable Not Applicable  Some recent data might be hidden

## 2019-06-29 NOTE — Progress Notes (Signed)
ANTICOAGULATION CONSULT NOTE - Follow Up Consult  Pharmacy Consult for Warfarin Indication: h/o pulmonary embolus and DVT  Allergies  Allergen Reactions  . Bee Venom Anaphylaxis, Shortness Of Breath and Swelling  . Other Shortness Of Breath    Itching, rash with IVP DYE, iodine, shellfish LATEX  . Shellfish Allergy Nausea And Vomiting and Other (See Comments)    Feels like insides are twisting  . Iodinated Diagnostic Agents     Other reaction(s): RASH  . Iohexol      Code: RASH, Desc: PT WAS ON PREDNISONE FOR GOUT TX. @ TIME OF SCAN AND RECEIVED 50 MG OF BENADRYL IV-ARS 10/08/07   . Iodine Rash  . Latex Rash    Patient Measurements: Height: 6' 3"  (190.5 cm) Weight: (!) 426 lb (193.2 kg) IBW/kg (Calculated) : 84.5 Heparin Dosing Weight: 132 kg  Vital Signs: Temp: 98.4 F (36.9 C) (12/31 0940) Temp Source: Oral (12/31 0940) BP: 115/56 (12/31 0940) Pulse Rate: 68 (12/31 0940)  Labs: Recent Labs    06/26/19 1639 06/27/19 0550 06/28/19 0502 06/28/19 1212 06/29/19 0607  HGB  --  7.6*  --  7.6* 7.9*  HCT  --  24.8*  --  25.6* 25.8*  PLT  --  81*  --  86* 88*  LABPROT  --  24.6* 26.0*  --  28.4*  INR  --  2.2* 2.4*  --  2.7*  HEPARINUNFRC 0.33 0.23*  --   --   --   CREATININE  --   --   --  4.92*  --     Estimated Creatinine Clearance: 31.8 mL/min (A) (by C-G formula based on SCr of 4.92 mg/dL (H)).   Medications:  Medications Prior to Admission  Medication Sig Dispense Refill Last Dose  . [EXPIRED] amoxicillin-clavulanate (AUGMENTIN) 500-125 MG tablet Take 1 tablet (500 mg total) by mouth 2 (two) times daily for 7 days. 14 tablet 0 06/11/2019 at Unknown time  . busPIRone (BUSPAR) 15 MG tablet TAKE 1 TABLET (15 MG TOTAL) BY MOUTH 2 (TWO) TIMES DAILY. (Patient taking differently: Take 15 mg by mouth 2 (two) times daily. TAKE 1 TABLET (15 MG TOTAL) BY MOUTH 2 (TWO) TIMES DAILY.) 180 tablet 0 06/11/2019 at Unknown time  . calcitRIOL (ROCALTROL) 0.25 MCG capsule Take  0.25 mcg by mouth daily.   Past Week at Unknown time  . cetirizine (ZYRTEC) 10 MG tablet Take 1 tablet (10 mg total) by mouth daily. 30 tablet 11 Past Week at Unknown time  . clomiPHENE (CLOMID) 50 MG tablet Take 0.5 tablets (25 mg total) by mouth every other day. 24 tablet 3 Past Week at Unknown time  . docusate sodium (COLACE) 100 MG capsule Take 1 capsule (100 mg total) by mouth 2 (two) times daily. 10 capsule 0 Past Week at Unknown time  . DULoxetine (CYMBALTA) 60 MG capsule TAKE 1 CAPSULE BY MOUTH EVERY DAY (Patient taking differently: Take 60 mg by mouth daily. ) 90 capsule 0 Past Week at Unknown time  . EMGALITY 120 MG/ML SOAJ Inject 120 mg as directed every 30 (thirty) days.    Past Week at Unknown time  . esomeprazole (NEXIUM) 40 MG capsule TAKE 1 CAPSULE (40 MG TOTAL) BY MOUTH DAILY AT 12 NOON. 90 capsule 0 Past Week at Unknown time  . fentaNYL (DURAGESIC) 50 MCG/HR Place 1 patch onto the skin every 3 (three) days.   06/11/2019 at Unknown time  . finasteride (PROSCAR) 5 MG tablet Take 1 tablet (5 mg total) by  mouth daily. 30 tablet 1 06/11/2019 at Unknown time  . fluticasone (FLONASE) 50 MCG/ACT nasal spray PLACE 1 SPRAY INTO BOTH NOSTRILS 2 (TWO) TIMES DAILY AS NEEDED FOR ALLERGIES. 48 g 4 Past Week at Unknown time  . Insulin Glargine, 1 Unit Dial, (TOUJEO SOLOSTAR) 300 UNIT/ML SOPN Inject 60 Units into the skin 2 (two) times daily. (Patient taking differently: Inject 200 Units into the skin daily. ) 130 pen 3 06/11/2019 at Unknown time  . insulin lispro (HUMALOG) 100 UNIT/ML KwikPen Inject 0.25 mLs (25 Units total) into the skin 3 (three) times daily. (Patient taking differently: Inject 50 Units into the skin 3 (three) times daily. ) 45 mL 0 06/11/2019 at Unknown time  . lamoTRIgine (LAMICTAL) 200 MG tablet TAKE 1 TABLET BY MOUTH TWICE A DAY (Patient taking differently: Take 200 mg by mouth 2 (two) times daily. ) 180 tablet 0 06/11/2019 at Unknown time  . levETIRAcetam (KEPPRA) 1000 MG  tablet Take 1 tablet (1,000 mg total) by mouth 2 (two) times daily. 180 tablet 3 06/10/2019 at Unknown time  . pregabalin (LYRICA) 50 MG capsule TAKE 1 CAPSULE BY MOUTH THREE TIMES A DAY (Patient taking differently: Take 50 mg by mouth 3 (three) times daily. TAKE 1 CAPSULE BY MOUTH THREE TIMES A DAY) 270 capsule 1 06/11/2019 at Unknown time  . PROAIR HFA 108 (90 Base) MCG/ACT inhaler TAKE 2 PUFFS BY MOUTH EVERY 6 HOURS AS NEEDED FOR WHEEZE OR SHORTNESS OF BREATH (Patient taking differently: Inhale 2 puffs into the lungs every 6 (six) hours as needed for wheezing or shortness of breath. ) 18 g 0 06/11/2019 at Unknown time  . rizatriptan (MAXALT) 10 MG tablet May repeat in 2 hours if needed (Patient taking differently: Take 10 mg by mouth as needed. May repeat in 2 hours if needed) 10 tablet 11 Past Week at Unknown time  . simvastatin (ZOCOR) 20 MG tablet Take 1 tablet (20 mg total) by mouth at bedtime. 90 tablet 3 06/10/2019 at Unknown time  . tamsulosin (FLOMAX) 0.4 MG CAPS capsule TAKE 1 CAPSULE BY MOUTH EVERY DAY (Patient taking differently: Take 0.4 mg by mouth daily. TAKE 1 CAPSULE BY MOUTH EVERY DAY) 90 capsule 0 06/11/2019 at Unknown time  . TRULICITY 1.5 OH/6.0VP SOPN Inject 1.5 mg as directed once a week.    Past Week at Unknown time  . vitamin C (VITAMIN C) 500 MG tablet Take 1 tablet (500 mg total) by mouth daily.   06/11/2019 at Unknown time  . Vitamin D, Ergocalciferol, (DRISDOL) 1.25 MG (50000 UT) CAPS capsule Take 50,000 Units by mouth once a week.   06/11/2019 at Unknown time  . warfarin (COUMADIN) 5 MG tablet Take 1 tablet (5 mg total) by mouth daily. Take 7m daily 90 tablet 2 06/10/2019 at 1700  . zinc sulfate 220 (50 Zn) MG capsule Take 1 capsule (220 mg total) by mouth 2 (two) times daily.   06/11/2019 at Unknown time  . EPIPEN 2-PAK 0.3 MG/0.3ML SOAJ injection INJECT 0.3 MLS (0.3 MG TOTAL) INTO THE MUSCLE ONCE. AS NEEDED FOR ANAPHYLACTIC REACTION (Patient taking differently: Inject  0.3 mg into the muscle as needed. ) 2 Device 2   . furosemide (LASIX) 80 MG tablet Take 1 tablet (80 mg total) by mouth 2 (two) times daily. Hold until follow up with nephrology     . HYDROcodone-acetaminophen (NORCO) 7.5-325 MG tablet Take 1 tablet by mouth every 6 (six) hours as needed for moderate pain. 90 tablet 0   .  NARCAN 4 MG/0.1ML LIQD nasal spray kit Place 1 spray into the nose once.       Scheduled:  . busPIRone  15 mg Oral BID  . calcium acetate  1,334 mg Oral TID WC  . Chlorhexidine Gluconate Cloth  6 each Topical Q0600  . darbepoetin (ARANESP) injection - DIALYSIS  150 mcg Intravenous Q Sat-HD  . DULoxetine  30 mg Oral Daily  . fentaNYL  1 patch Transdermal Q3 days  . finasteride  5 mg Oral Daily  . hydrocortisone cream   Topical BID  . insulin aspart  0-9 Units Subcutaneous TID WC  . insulin glargine  45 Units Subcutaneous BID  . lamoTRIgine  200 mg Oral BID  . polyethylene glycol  17 g Oral BID  . pregabalin  50 mg Oral BID  . senna  1 tablet Oral QHS  . simvastatin  20 mg Oral QHS  . tamsulosin  0.4 mg Oral Daily  . traZODone  50 mg Oral QHS  . Warfarin - Pharmacist Dosing Inpatient   Does not apply q1800    Assessment: 53 yr old male amitted on 06/10/19 with a PMH significant for multiple DVT/PE, on warfarin PTA. 5 mg daily (LD pta taken 06/10/19. INR was therapeutic on admit date.  Warfarin was held on admission due to gross hematuria. S/P 5 unit RBCs during this hospitalization.   On IV heparin 12/21 >>12/29. Warfarin resumed 12/22 - INR was therapeutic on 12/28.  No acute clot so 24 hour overlap with therapeutic thus heparin not needed.  Heparin infusion discontinued 12/29.  Today INR = 2.4>>2.7 remains therapeutic.   Hgb 7.9 , pltc 88 on 12/31. Thrombocytopenia at baseline.   No bleeding reported.   Goal of Therapy:  INR 2-3 Monitor platelets by anticoagulation protocol: Yes   Plan:  Warfarin 5 mg PO x 1 tonight Daily INR Monitor CBC in AM  Emmitte Surgeon A.  Levada Dy, PharmD, BCPS, FNKF Clinical Pharmacist [place] Please utilize Amion for appropriate phone number to reach the unit pharmacist ([place])   06/29/2019,10:06 AM

## 2019-06-29 NOTE — Progress Notes (Signed)
PT Cancellation Note  Patient Details Name: [name] MRN: [id number] DOB: [birth year]   Cancelled Treatment:    Reason Eval/Treat Not Completed: Fatigue/lethargy limiting ability to participate.  Pt was not able to be woken up to perform therapy despite all efforts of PT.  Will try again at another time.   Ramond Dial 06/29/2019, 4:04 PM   Mee Hives, PT MS Acute Rehab Dept. Number: [place]

## 2019-06-30 LAB — CBC WITH DIFFERENTIAL/PLATELET
Abs Immature Granulocytes: 0.03 10*3/uL (ref 0.00–0.07)
Basophils Absolute: 0.1 10*3/uL (ref 0.0–0.1)
Basophils Relative: 2 %
Eosinophils Absolute: 0.2 10*3/uL (ref 0.0–0.5)
Eosinophils Relative: 7 %
HCT: 26 % — ABNORMAL LOW (ref 39.0–52.0)
Hemoglobin: 7.9 g/dL — ABNORMAL LOW (ref 13.0–17.0)
Immature Granulocytes: 1 %
Lymphocytes Relative: 27 %
Lymphs Abs: 0.9 10*3/uL (ref 0.7–4.0)
MCH: 31.1 pg (ref 26.0–34.0)
MCHC: 30.4 g/dL (ref 30.0–36.0)
MCV: 102.4 fL — ABNORMAL HIGH (ref 80.0–100.0)
Monocytes Absolute: 0.4 10*3/uL (ref 0.1–1.0)
Monocytes Relative: 13 %
Neutro Abs: 1.7 10*3/uL (ref 1.7–7.7)
Neutrophils Relative %: 50 %
Platelets: 86 10*3/uL — ABNORMAL LOW (ref 150–400)
RBC: 2.54 MIL/uL — ABNORMAL LOW (ref 4.22–5.81)
RDW: 15.1 % (ref 11.5–15.5)
WBC: 3.3 10*3/uL — ABNORMAL LOW (ref 4.0–10.5)
nRBC: 0 % (ref 0.0–0.2)

## 2019-06-30 LAB — PROTIME-INR
INR: 2.5 — ABNORMAL HIGH (ref 0.8–1.2)
Prothrombin Time: 27.2 seconds — ABNORMAL HIGH (ref 11.4–15.2)

## 2019-06-30 LAB — GLUCOSE, CAPILLARY
Glucose-Capillary: 135 mg/dL — ABNORMAL HIGH (ref 70–99)
Glucose-Capillary: 206 mg/dL — ABNORMAL HIGH (ref 70–99)
Glucose-Capillary: 154 mg/dL — ABNORMAL HIGH (ref 70–99)
Glucose-Capillary: 190 mg/dL — ABNORMAL HIGH (ref 70–99)

## 2019-06-30 LAB — BASIC METABOLIC PANEL
Anion gap: 10 (ref 5–15)
BUN: 44 mg/dL — ABNORMAL HIGH (ref 6–20)
CO2: 26 mmol/L (ref 22–32)
Calcium: 8.8 mg/dL — ABNORMAL LOW (ref 8.9–10.3)
Chloride: 98 mmol/L (ref 98–111)
Creatinine, Ser: 4.68 mg/dL — ABNORMAL HIGH (ref 0.61–1.24)
GFR calc Af Amer: 15 mL/min — ABNORMAL LOW (ref >60)
GFR calc non Af Amer: 13 mL/min — ABNORMAL LOW (ref >60)
Glucose, Bld: 140 mg/dL — ABNORMAL HIGH (ref 70–99)
Potassium: 4.5 mmol/L (ref 3.5–5.1)
Sodium: 134 mmol/L — ABNORMAL LOW (ref 135–145)

## 2019-06-30 MED ORDER — WARFARIN SODIUM 5 MG PO TABS
5.0000 mg | ORAL_TABLET | Freq: Once | ORAL | Status: AC
  Administered 2019-06-30: 5 mg via ORAL
  Filled 2019-06-30: qty 1

## 2019-06-30 MED ORDER — DARBEPOETIN ALFA 150 MCG/0.3ML IJ SOSY
150.0000 ug | PREFILLED_SYRINGE | Freq: Once | INTRAMUSCULAR | Status: AC
  Filled 2019-06-30: qty 0.3

## 2019-06-30 MED ORDER — DARBEPOETIN ALFA 150 MCG/0.3ML IJ SOSY: 150.0000 ug | PREFILLED_SYRINGE | INTRAMUSCULAR | Status: DC

## 2019-06-30 NOTE — Progress Notes (Signed)
Informed [facility] HD Tx moved to1/07/2018 per Dr. Hollie Salk.

## 2019-06-30 NOTE — Progress Notes (Signed)
Patient ID: [name], male   DOB: 02/09/1966, 54 y.o.   MRN: [id number]  PROGRESS NOTE    KEISEAN SEABERG  [id number] DOB: 01/18/1966 DOA: 06/11/2019 PCP: Sharion Balloon, FNP   Brief Narrative:  54 year old male with history of chronic kidney disease stage V, chronic hypoxic respiratory failure on 3 L nasal cannula oxygen at home, OSA, OHS, diabetes mellitus type 2, pulmonary embolism, DVT, Covid pneumonia from 04/09/2019-04/25/2019, hospitalization from 06/04/2019-06/08/2023 UTI with gross hematuria, complicated pyelonephritis, acute on chronic CKD presented with inability to void along with pain and was found to have creatinine of 5.42.  He was transferred to [facility] for hemodialysis.  During the hospitalization, he has been started on hemodialysis.  PT is recommending CIR placement but patient refuses it.  He is awaiting outpatient hemodialysis arrangement.  Assessment & Plan:    End-stage renal disease; Initially AKI superimposed on his chronic renal disease stage V -Status post AV fistula placement on 06/19/2019.  Right IJ tunneled dialysis catheter on 06/20/2019 -Started HD on 06/15/2019.  Nephrology following.  Inpatient dialysis as per nephrology schedule.  Outpatient hemodialysis unit is being arranged. -Avoid morphine, baclofen, sodium phosphate, magnesium citrate-based laxative  Gross hematuria with associated acute blood loss anemia: Resolved Hemorrhagic right kidney cyst on CT scan -Seen by urology on 06/15/2019.  Felt to have no need for urgent cystoscopy evaluation given improvement.  Patient poor candidate for embolization versus nephrectomy -Continue conservative management with Foley catheter and finasteride as recommended by Dr. Alyson Ingles.  Outpatient follow-up with urology.  History of DVT/PE on warfarin as an outpatient -Continue Coumadin as per pharmacy.  INR now therapeutic.  Heparin stopped  Diabetes mellitus type 2 uncontrolled with  hyperglycemia -Continue Lantus along with CBGs with SSI.  Anemia of chronic disease/acute blood loss anemia -Status post 5 units packed red cells during the hospitalization.  Currently hemoglobin stable.  Transfuse if hemoglobin is less than 7  Pancytopenia -Stable.  etiology unclear.  Monitor  Chronic hypoxic respiratory failure on 3 to 4 L oxygen via nasal cannula OSA OHS -Stable.  Continue oxygen supplementation  Mood disorder/bipolar disorder -Stable.  Continue BuSpar, Cymbalta, Lamictal, Keppra and trazodone  Seizure disorder -Continue Keppra and Lamictal  Chronic pain syndrome -Stable.  Continue fentanyl patch  Morbid obesity -Outpatient follow-up  Debility, wheelchair-bound at baseline -Overall prognosis is guarded to poor.  Palliative care evaluation for goals of care is pending.  PT recommended CIR.  Patient refused CIR  Left arm rash -Nonspecific.  Resolving with hydrocortisone cream  Mild confusion -Monitor mental status.  Currently stable.  If mental status worsens, might consider holding Lyrica, BuSpar and Cymbalta   DVT prophylaxis: Coumadin Code Status: Full Family Communication: Spoke to patient at bedside Disposition Plan: Refusing CIR.  Home with PT/OT once outpatient hemodialysis unit has been arranged  Consultants: Nephrology/vascular surgery/IR/urology  Procedures:  AV fistula placement on 06/19/2019.  Right IJ tunneled dialysis catheter on 06/20/2019 -Started HD on 06/15/2019  Antimicrobials:  Anti-infectives (From admission, onward)   Start     Dose/Rate Route Frequency Ordered Stop   06/21/19 1257  ceFAZolin (ANCEF) 2-4 GM/100ML-% IVPB    Note to Pharmacy: Rudene Re   : cabinet override      06/21/19 1257 06/21/19 1611   06/21/19 0000  ceFAZolin (ANCEF) IVPB 2g/100 mL premix     2 g 200 mL/hr over 30 Minutes Intravenous To Radiology 06/20/19 1348 06/21/19 1337   06/19/19 0600  cefUROXime (ZINACEF) 1.5 g in  sodium chloride 0.9 % 100  mL IVPB     1.5 g 200 mL/hr over 30 Minutes Intravenous On call to O.R. 06/18/19 0959 06/19/19 1145   06/11/19 2200  amoxicillin-clavulanate (AUGMENTIN) 500-125 MG per tablet 500 mg  Status:  Discontinued     1 tablet Oral 2 times daily 06/11/19 2114 06/14/19 0135       Subjective: Patient seen and examined at bedside.  Poor historian.  No worsening shortness of breath, fever, vomiting. Objective: Vitals:   06/29/19 0940 06/29/19 1651 06/29/19 2050 06/30/19 0452  BP: (!) 115/56 (!) 104/43 (!) 117/48 (!) 115/51  Pulse: 68 63 65 66  Resp: 18 18    Temp: 98.4 F (36.9 C) 98.6 F (37 C) 98.1 F (36.7 C) 98.4 F (36.9 C)  TempSrc: Oral Oral Oral Oral  SpO2: 95% 96% 98% 93%  Weight:      Height:        Intake/Output Summary (Last 24 hours) at 06/30/2019 0801 Last data filed at 06/29/2019 2300 Gross per 24 hour  Intake 577 ml  Output --  Net 577 ml   Filed Weights   06/26/19 2108 06/28/19 1200 06/28/19 2105  Weight: (!) 195.1 kg (!) 198 kg (!) 193.2 kg    Examination:  General exam: No acute distress.  Looks chronically ill and older than stated age.  Poor historian Respiratory system: Bilateral decreased breath sounds at bases with scattered crackles.  No wheezing  cardiovascular system: Rate controlled, S1-S2 heard Gastrointestinal system: Abdomen is morbidly obese, nondistended, soft and nontender. Normal bowel sounds heard. Extremities: No cyanosis; bilateral lower extremity edema present    Data Reviewed: I have personally reviewed following labs and imaging studies  CBC: Recent Labs  Lab 06/26/19 0224 06/27/19 0550 06/28/19 1212 06/29/19 0607 06/30/19 0515  WBC 3.0* 3.2* 3.4* 3.3* 3.3*  NEUTROABS  --   --   --   --  1.7  HGB 8.2* 7.6* 7.6* 7.9* 7.9*  HCT 26.0* 24.8* 25.6* 25.8* 26.0*  MCV 97.4 100.0 102.8* 101.6* 102.4*  PLT 80* 81* 86* 88* 86*   Basic Metabolic Panel: Recent Labs  Lab 06/24/19 0912 06/28/19 1212 06/30/19 0515  NA 133* 131* 134*   K 4.0 4.5 4.5  CL 98 97* 98  CO2 26 28 26   GLUCOSE 203* 183* 140*  BUN 63* 46* 44*  CREATININE 5.13* 4.92* 4.68*  CALCIUM 8.5* 8.9 8.8*  PHOS 5.4* 4.6  --    GFR: Estimated Creatinine Clearance: 33.4 mL/min (A) (by C-G formula based on SCr of 4.68 mg/dL (H)). Liver Function Tests: Recent Labs  Lab 06/24/19 0912 06/28/19 1212  ALBUMIN 2.4* 2.4*   No results for input(s): LIPASE, AMYLASE in the last 168 hours. No results for input(s): AMMONIA in the last 168 hours. Coagulation Profile: Recent Labs  Lab 06/26/19 0224 06/27/19 0550 06/28/19 0502 06/29/19 0607 06/30/19 0515  INR 1.8* 2.2* 2.4* 2.7* 2.5*   Cardiac Enzymes: No results for input(s): CKTOTAL, CKMB, CKMBINDEX, TROPONINI in the last 168 hours. BNP (last 3 results) No results for input(s): PROBNP in the last 8760 hours. HbA1C: No results for input(s): HGBA1C in the last 72 hours. CBG: Recent Labs  Lab 06/29/19 0648 06/29/19 1134 06/29/19 1650 06/29/19 2112 06/30/19 0720  GLUCAP 171* 158* 131* 210* 135*   Lipid Profile: No results for input(s): CHOL, HDL, LDLCALC, TRIG, CHOLHDL, LDLDIRECT in the last 72 hours. Thyroid Function Tests: No results for input(s): TSH, T4TOTAL, FREET4, T3FREE, THYROIDAB in the last  72 hours. Anemia Panel: No results for input(s): VITAMINB12, FOLATE, FERRITIN, TIBC, IRON, RETICCTPCT in the last 72 hours. Sepsis Labs: No results for input(s): PROCALCITON, LATICACIDVEN in the last 168 hours.  No results found for this or any previous visit (from the past 240 hour(s)).       Radiology Studies: No results found.      Scheduled Meds: . busPIRone  15 mg Oral BID  . calcium acetate  1,334 mg Oral TID WC  . Chlorhexidine Gluconate Cloth  6 each Topical Q0600  . darbepoetin (ARANESP) injection - DIALYSIS  150 mcg Intravenous Q Fri-HD  . DULoxetine  30 mg Oral Daily  . fentaNYL  1 patch Transdermal Q3 days  . finasteride  5 mg Oral Daily  . hydrocortisone cream    Topical BID  . insulin aspart  0-9 Units Subcutaneous TID WC  . insulin glargine  45 Units Subcutaneous BID  . lamoTRIgine  200 mg Oral BID  . polyethylene glycol  17 g Oral BID  . pregabalin  50 mg Oral BID  . senna  1 tablet Oral QHS  . simvastatin  20 mg Oral QHS  . tamsulosin  0.4 mg Oral Daily  . traZODone  50 mg Oral QHS  . Warfarin - Pharmacist Dosing Inpatient   Does not apply q1800   Continuous Infusions: . sodium chloride 250 mL (06/15/19 1130)  . sodium chloride            Aline August, MD [facility] 06/30/2019, 8:01 AM

## 2019-06-30 NOTE — Progress Notes (Signed)
Renal Navigator confirmed with [facility] for [facility] clinic that patient has been accepted at [facility], however, since the clinic is closed today for the holiday, Renal Navigator will follow up on Monday in order to obtain his treatment schedule.  Navigator left message for patient's wife update.  Alphonzo Cruise, [place]  Renal Navigator [phone]

## 2019-06-30 NOTE — Progress Notes (Signed)
  [facility] Progress Note    Assessment/ Plan:   1. New ESRD, s/p AVF LUE BC 12/21 and R IJ TDC 12/23; Start HD 12/17, CLIP'd to [facility]. Cont HD MWF schedule, next planned for today 06/30/2019 2. Gross hematuria: s/p uro eval- no heparin with HD 3. Anemia: s/p Fe and on ESA, CTM 4. AMS, improved 5. CKD-BMD: PHosLo, PTH ok, CTM 6. OSA on CPAP 7. Obesity 8. Hx/o PE/DVT, on warfarin 9. Debility, needs aggressive PT/OT and need to see if can successfully transfer from powerchair to HD -- will need Hoyer up to 600 lbs  Subjective:    For HD today.  Awaits final dispo.  Objective:   BP (!) 115/51 (BP Location: Right Arm)   Pulse 66   Temp 98.4 F (36.9 C) (Oral)   Resp 18   Ht 6\' 3"  (1.905 m)   Wt (!) 193.2 kg   SpO2 93%   BMI 53.25 kg/m   Intake/Output Summary (Last 24 hours) at 06/30/2019 1324 Last data filed at 06/30/2019 0900 Gross per 24 hour  Intake 697 ml  Output 150 ml  Net 547 ml   Weight change:   Physical Exam: Gen: NAD, obese CVS: RRR Resp: on O2, nonlabored Abd: obese Ext: 1+ LE edema  Imaging: No results found.  Labs: BMET Recent Labs  Lab 06/24/19 0912 06/28/19 1212 06/30/19 0515  NA 133* 131* 134*  K 4.0 4.5 4.5  CL 98 97* 98  CO2 26 28 26   GLUCOSE 203* 183* 140*  BUN 63* 46* 44*  CREATININE 5.13* 4.92* 4.68*  CALCIUM 8.5* 8.9 8.8*  PHOS 5.4* 4.6  --    CBC Recent Labs  Lab 06/27/19 0550 06/28/19 1212 06/29/19 0607 06/30/19 0515  WBC 3.2* 3.4* 3.3* 3.3*  NEUTROABS  --   --   --  1.7  HGB 7.6* 7.6* 7.9* 7.9*  HCT 24.8* 25.6* 25.8* 26.0*  MCV 100.0 102.8* 101.6* 102.4*  PLT 81* 86* 88* 86*    Medications:    . busPIRone  15 mg Oral BID  . calcium acetate  1,334 mg Oral TID WC  . Chlorhexidine Gluconate Cloth  6 each Topical Q0600  . darbepoetin (ARANESP) injection - DIALYSIS  150 mcg Intravenous Q Fri-HD  . DULoxetine  30 mg Oral Daily  . fentaNYL  1 patch Transdermal Q3 days  . finasteride  5 mg Oral Daily  .  hydrocortisone cream   Topical BID  . insulin aspart  0-9 Units Subcutaneous TID WC  . insulin glargine  45 Units Subcutaneous BID  . lamoTRIgine  200 mg Oral BID  . polyethylene glycol  17 g Oral BID  . pregabalin  50 mg Oral BID  . senna  1 tablet Oral QHS  . simvastatin  20 mg Oral QHS  . tamsulosin  0.4 mg Oral Daily  . traZODone  50 mg Oral QHS  . warfarin  5 mg Oral ONCE-1800  . Warfarin - Pharmacist Dosing Inpatient   Does not apply q1800      Madelon Lips, MD 06/30/2019, 1:24 PM

## 2019-06-30 NOTE — Progress Notes (Signed)
ANTICOAGULATION CONSULT NOTE - Follow Up Consult  Pharmacy Consult for Warfarin Indication: h/o pulmonary embolus and DVT  Allergies  Allergen Reactions  . Bee Venom Anaphylaxis, Shortness Of Breath and Swelling  . Other Shortness Of Breath    Itching, rash with IVP DYE, iodine, shellfish LATEX  . Shellfish Allergy Nausea And Vomiting and Other (See Comments)    Feels like insides are twisting  . Iodinated Diagnostic Agents     Other reaction(s): RASH  . Iohexol      Code: RASH, Desc: PT WAS ON PREDNISONE FOR GOUT TX. @ TIME OF SCAN AND RECEIVED 50 MG OF BENADRYL IV-ARS 10/08/07   . Iodine Rash  . Latex Rash    Patient Measurements: Height: _0  (190.5 cm) Weight: (!) 426 lb (193.2 kg) IBW/kg (Calculated) : 84.5  Vital Signs: Temp: 98.4 F (36.9 C) (01/01 0452) Temp Source: Oral (01/01 0452) BP: 115/51 (01/01 0452) Pulse Rate: 66 (01/01 0452)  Labs: Recent Labs    06/28/19 0502 06/28/19 1212 06/29/19 0607 06/30/19 0515  HGB  --  7.6* 7.9* 7.9*  HCT  --  25.6* 25.8* 26.0*  PLT  --  86* 88* 86*  LABPROT 26.0*  --  28.4* 27.2*  INR 2.4*  --  2.7* 2.5*  CREATININE  --  4.92*  --  4.68*    Estimated Creatinine Clearance: 33.4 mL/min (A) (by C-G formula based on SCr of 4.68 mg/dL (H)).   Medications:  Medications Prior to Admission  Medication Sig Dispense Refill Last Dose  . [EXPIRED] amoxicillin-clavulanate (AUGMENTIN) 500-125 MG tablet Take 1 tablet (500 mg total) by mouth 2 (two) times daily for 7 days. 14 tablet 0 06/11/2019 at Unknown time  . busPIRone (BUSPAR) 15 MG tablet TAKE 1 TABLET (15 MG TOTAL) BY MOUTH 2 (TWO) TIMES DAILY. (Patient taking differently: Take 15 mg by mouth 2 (two) times daily. TAKE 1 TABLET (15 MG TOTAL) BY MOUTH 2 (TWO) TIMES DAILY.) 180 tablet 0 06/11/2019 at Unknown time  . calcitRIOL (ROCALTROL) 0.25 MCG capsule Take 0.25 mcg by mouth daily.   Past Week at Unknown time  . cetirizine (ZYRTEC) 10 MG tablet Take 1 tablet (10 mg total)  by mouth daily. 30 tablet 11 Past Week at Unknown time  . clomiPHENE (CLOMID) 50 MG tablet Take 0.5 tablets (25 mg total) by mouth every other day. 24 tablet 3 Past Week at Unknown time  . docusate sodium (COLACE) 100 MG capsule Take 1 capsule (100 mg total) by mouth 2 (two) times daily. 10 capsule 0 Past Week at Unknown time  . DULoxetine (CYMBALTA) 60 MG capsule TAKE 1 CAPSULE BY MOUTH EVERY DAY (Patient taking differently: Take 60 mg by mouth daily. ) 90 capsule 0 Past Week at Unknown time  . EMGALITY 120 MG/ML SOAJ Inject 120 mg as directed every 30 (thirty) days.    Past Week at Unknown time  . esomeprazole (NEXIUM) 40 MG capsule TAKE 1 CAPSULE (40 MG TOTAL) BY MOUTH DAILY AT 12 NOON. 90 capsule 0 Past Week at Unknown time  . fentaNYL (DURAGESIC) 50 MCG/HR Place 1 patch onto the skin every 3 (three) days.   06/11/2019 at Unknown time  . finasteride (PROSCAR) 5 MG tablet Take 1 tablet (5 mg total) by mouth daily. 30 tablet 1 06/11/2019 at Unknown time  . fluticasone (FLONASE) 50 MCG/ACT nasal spray PLACE 1 SPRAY INTO BOTH NOSTRILS 2 (TWO) TIMES DAILY AS NEEDED FOR ALLERGIES. 48 g 4 Past Week at Unknown time  .  Insulin Glargine, 1 Unit Dial, (TOUJEO SOLOSTAR) 300 UNIT/ML SOPN Inject 60 Units into the skin 2 (two) times daily. (Patient taking differently: Inject 200 Units into the skin daily. ) 130 pen 3 06/11/2019 at Unknown time  . insulin lispro (HUMALOG) 100 UNIT/ML KwikPen Inject 0.25 mLs (25 Units total) into the skin 3 (three) times daily. (Patient taking differently: Inject 50 Units into the skin 3 (three) times daily. ) 45 mL 0 06/11/2019 at Unknown time  . lamoTRIgine (LAMICTAL) 200 MG tablet TAKE 1 TABLET BY MOUTH TWICE A DAY (Patient taking differently: Take 200 mg by mouth 2 (two) times daily. ) 180 tablet 0 06/11/2019 at Unknown time  . levETIRAcetam (KEPPRA) 1000 MG tablet Take 1 tablet (1,000 mg total) by mouth 2 (two) times daily. 180 tablet 3 06/10/2019 at Unknown time  . pregabalin  (LYRICA) 50 MG capsule TAKE 1 CAPSULE BY MOUTH THREE TIMES A DAY (Patient taking differently: Take 50 mg by mouth 3 (three) times daily. TAKE 1 CAPSULE BY MOUTH THREE TIMES A DAY) 270 capsule 1 06/11/2019 at Unknown time  . PROAIR HFA 108 (90 Base) MCG/ACT inhaler TAKE 2 PUFFS BY MOUTH EVERY 6 HOURS AS NEEDED FOR WHEEZE OR SHORTNESS OF BREATH (Patient taking differently: Inhale 2 puffs into the lungs every 6 (six) hours as needed for wheezing or shortness of breath. ) 18 g 0 06/11/2019 at Unknown time  . rizatriptan (MAXALT) 10 MG tablet May repeat in 2 hours if needed (Patient taking differently: Take 10 mg by mouth as needed. May repeat in 2 hours if needed) 10 tablet 11 Past Week at Unknown time  . simvastatin (ZOCOR) 20 MG tablet Take 1 tablet (20 mg total) by mouth at bedtime. 90 tablet 3 06/10/2019 at Unknown time  . tamsulosin (FLOMAX) 0.4 MG CAPS capsule TAKE 1 CAPSULE BY MOUTH EVERY DAY (Patient taking differently: Take 0.4 mg by mouth daily. TAKE 1 CAPSULE BY MOUTH EVERY DAY) 90 capsule 0 06/11/2019 at Unknown time  . TRULICITY 1.5 AS/3.4HD SOPN Inject 1.5 mg as directed once a week.    Past Week at Unknown time  . vitamin C (VITAMIN C) 500 MG tablet Take 1 tablet (500 mg total) by mouth daily.   06/11/2019 at Unknown time  . Vitamin D, Ergocalciferol, (DRISDOL) 1.25 MG (50000 UT) CAPS capsule Take 50,000 Units by mouth once a week.   06/11/2019 at Unknown time  . warfarin (COUMADIN) 5 MG tablet Take 1 tablet (5 mg total) by mouth daily. Take 54m daily 90 tablet 2 06/10/2019 at 1700  . zinc sulfate 220 (50 Zn) MG capsule Take 1 capsule (220 mg total) by mouth 2 (two) times daily.   06/11/2019 at Unknown time  . EPIPEN 2-PAK 0.3 MG/0.3ML SOAJ injection INJECT 0.3 MLS (0.3 MG TOTAL) INTO THE MUSCLE ONCE. AS NEEDED FOR ANAPHYLACTIC REACTION (Patient taking differently: Inject 0.3 mg into the muscle as needed. ) 2 Device 2   . furosemide (LASIX) 80 MG tablet Take 1 tablet (80 mg total) by mouth 2  (two) times daily. Hold until follow up with nephrology     . HYDROcodone-acetaminophen (NORCO) 7.5-325 MG tablet Take 1 tablet by mouth every 6 (six) hours as needed for moderate pain. 90 tablet 0   . NARCAN 4 MG/0.1ML LIQD nasal spray kit Place 1 spray into the nose once.       Scheduled:  . busPIRone  15 mg Oral BID  . calcium acetate  1,334 mg Oral TID WC  .  Chlorhexidine Gluconate Cloth  6 each Topical Q0600  . darbepoetin (ARANESP) injection - DIALYSIS  150 mcg Intravenous Q Fri-HD  . DULoxetine  30 mg Oral Daily  . fentaNYL  1 patch Transdermal Q3 days  . finasteride  5 mg Oral Daily  . hydrocortisone cream   Topical BID  . insulin aspart  0-9 Units Subcutaneous TID WC  . insulin glargine  45 Units Subcutaneous BID  . lamoTRIgine  200 mg Oral BID  . polyethylene glycol  17 g Oral BID  . pregabalin  50 mg Oral BID  . senna  1 tablet Oral QHS  . simvastatin  20 mg Oral QHS  . tamsulosin  0.4 mg Oral Daily  . traZODone  50 mg Oral QHS  . Warfarin - Pharmacist Dosing Inpatient   Does not apply q1800    Assessment: 54 yr old male amitted on 06/10/19 with a PMH significant for multiple DVT/PE. Patient is on warfarin 5 mg daily prior to admission (LD pta taken 06/10/19). INR was therapeutic on admit date. Warfarin was held on admission due to gross hematuria. S/P 5 unit RBCs during this hospitalization.   Patient was on IV heparin from 12/21 to 12/29 and heparin was discontinued when INR was therapeutic on 12/28 after warfarin was resumed on 12/22. Today, INR of 2.5 is therapeutic. Hgb 7.9. Plt 86 which is approximately at the patient's baseline. No reported bleeding.   Goal of Therapy:  INR 2-3 Monitor platelets by anticoagulation protocol: Yes   Plan:  Warfarin 5 mg PO x 1 tonight Monitor daily INR, CBC and S/S of bleeding   Cristela Felt, PharmD PGY1 Pharmacy Resident [employer]: [phone]  06/30/2019,10:45 AM

## 2019-07-01 LAB — CBC WITH DIFFERENTIAL/PLATELET
Abs Immature Granulocytes: 0.03 10*3/uL (ref 0.00–0.07)
Basophils Absolute: 0 10*3/uL (ref 0.0–0.1)
Basophils Relative: 1 %
Eosinophils Absolute: 0.2 10*3/uL (ref 0.0–0.5)
Eosinophils Relative: 7 %
HCT: 26 % — ABNORMAL LOW (ref 39.0–52.0)
Hemoglobin: 8 g/dL — ABNORMAL LOW (ref 13.0–17.0)
Immature Granulocytes: 1 %
Lymphocytes Relative: 25 %
Lymphs Abs: 0.8 10*3/uL (ref 0.7–4.0)
MCH: 31 pg (ref 26.0–34.0)
MCHC: 30.8 g/dL (ref 30.0–36.0)
MCV: 100.8 fL — ABNORMAL HIGH (ref 80.0–100.0)
Monocytes Absolute: 0.4 10*3/uL (ref 0.1–1.0)
Monocytes Relative: 12 %
Neutro Abs: 1.7 10*3/uL (ref 1.7–7.7)
Neutrophils Relative %: 54 %
Platelets: 75 10*3/uL — ABNORMAL LOW (ref 150–400)
RBC: 2.58 MIL/uL — ABNORMAL LOW (ref 4.22–5.81)
RDW: 14.9 % (ref 11.5–15.5)
WBC: 3.1 10*3/uL — ABNORMAL LOW (ref 4.0–10.5)
nRBC: 0 % (ref 0.0–0.2)

## 2019-07-01 LAB — PROTIME-INR
INR: 2.7 — ABNORMAL HIGH (ref 0.8–1.2)
Prothrombin Time: 28.6 seconds — ABNORMAL HIGH (ref 11.4–15.2)

## 2019-07-01 LAB — GLUCOSE, CAPILLARY
Glucose-Capillary: 140 mg/dL — ABNORMAL HIGH (ref 70–99)
Glucose-Capillary: 124 mg/dL — ABNORMAL HIGH (ref 70–99)
Glucose-Capillary: 185 mg/dL — ABNORMAL HIGH (ref 70–99)
Glucose-Capillary: 201 mg/dL — ABNORMAL HIGH (ref 70–99)

## 2019-07-01 LAB — BASIC METABOLIC PANEL
Anion gap: 10 (ref 5–15)
BUN: 63 mg/dL — ABNORMAL HIGH (ref 6–20)
CO2: 28 mmol/L (ref 22–32)
Calcium: 8.8 mg/dL — ABNORMAL LOW (ref 8.9–10.3)
Chloride: 97 mmol/L — ABNORMAL LOW (ref 98–111)
Creatinine, Ser: 5.73 mg/dL — ABNORMAL HIGH (ref 0.61–1.24)
GFR calc Af Amer: 12 mL/min — ABNORMAL LOW (ref >60)
GFR calc non Af Amer: 10 mL/min — ABNORMAL LOW (ref >60)
Glucose, Bld: 165 mg/dL — ABNORMAL HIGH (ref 70–99)
Potassium: 4.8 mmol/L (ref 3.5–5.1)
Sodium: 135 mmol/L (ref 135–145)

## 2019-07-01 MED ORDER — WARFARIN SODIUM 5 MG PO TABS
5.0000 mg | ORAL_TABLET | Freq: Once | ORAL | Status: AC
  Administered 2019-07-01: 5 mg via ORAL
  Filled 2019-07-01: qty 1

## 2019-07-01 MED ORDER — DARBEPOETIN ALFA 150 MCG/0.3ML IJ SOSY
PREFILLED_SYRINGE | INTRAMUSCULAR | Status: AC
  Administered 2019-07-01: 150 ug via INTRAVENOUS
  Filled 2019-07-01: qty 0.3

## 2019-07-01 MED ORDER — HEPARIN SODIUM (PORCINE) 1000 UNIT/ML IJ SOLN
INTRAMUSCULAR | Status: AC
  Administered 2019-07-01: 3800 [IU]
  Filled 2019-07-01: qty 5

## 2019-07-01 NOTE — Progress Notes (Signed)
ANTICOAGULATION CONSULT NOTE - Follow Up Consult  Pharmacy Consult for Warfarin Indication: h/o pulmonary embolus and DVT  Allergies  Allergen Reactions  . Bee Venom Anaphylaxis, Shortness Of Breath and Swelling  . Other Shortness Of Breath    Itching, rash with IVP DYE, iodine, shellfish LATEX  . Shellfish Allergy Nausea And Vomiting and Other (See Comments)    Feels like insides are twisting  . Iodinated Diagnostic Agents     Other reaction(s): RASH  . Iohexol      Code: RASH, Desc: PT WAS ON PREDNISONE FOR GOUT TX. @ TIME OF SCAN AND RECEIVED 50 MG OF BENADRYL IV-ARS 10/08/07   . Iodine Rash  . Latex Rash    Patient Measurements: Height: _0  (190.5 cm) Weight: (!) 426 lb (193.2 kg) IBW/kg (Calculated) : 84.5  Vital Signs: Temp: 98.7 F (37.1 C) (01/02 0651) Temp Source: Oral (01/02 0651) BP: 112/49 (01/02 0651) Pulse Rate: 67 (01/02 0651)  Labs: Recent Labs    06/28/19 1212 06/29/19 0607 06/30/19 0515 07/01/19 0425  HGB 7.6* 7.9* 7.9* 8.0*  HCT 25.6* 25.8* 26.0* 26.0*  PLT 86* 88* 86* 75*  LABPROT  --  28.4* 27.2* 28.6*  INR  --  2.7* 2.5* 2.7*  CREATININE 4.92*  --  4.68* 5.73*    Estimated Creatinine Clearance: 27.3 mL/min (A) (by C-G formula based on SCr of 5.73 mg/dL (H)).   Medications:  Medications Prior to Admission  Medication Sig Dispense Refill Last Dose  . [EXPIRED] amoxicillin-clavulanate (AUGMENTIN) 500-125 MG tablet Take 1 tablet (500 mg total) by mouth 2 (two) times daily for 7 days. 14 tablet 0 06/11/2019 at Unknown time  . busPIRone (BUSPAR) 15 MG tablet TAKE 1 TABLET (15 MG TOTAL) BY MOUTH 2 (TWO) TIMES DAILY. (Patient taking differently: Take 15 mg by mouth 2 (two) times daily. TAKE 1 TABLET (15 MG TOTAL) BY MOUTH 2 (TWO) TIMES DAILY.) 180 tablet 0 06/11/2019 at Unknown time  . calcitRIOL (ROCALTROL) 0.25 MCG capsule Take 0.25 mcg by mouth daily.   Past Week at Unknown time  . cetirizine (ZYRTEC) 10 MG tablet Take 1 tablet (10 mg total)  by mouth daily. 30 tablet 11 Past Week at Unknown time  . clomiPHENE (CLOMID) 50 MG tablet Take 0.5 tablets (25 mg total) by mouth every other day. 24 tablet 3 Past Week at Unknown time  . docusate sodium (COLACE) 100 MG capsule Take 1 capsule (100 mg total) by mouth 2 (two) times daily. 10 capsule 0 Past Week at Unknown time  . DULoxetine (CYMBALTA) 60 MG capsule TAKE 1 CAPSULE BY MOUTH EVERY DAY (Patient taking differently: Take 60 mg by mouth daily. ) 90 capsule 0 Past Week at Unknown time  . EMGALITY 120 MG/ML SOAJ Inject 120 mg as directed every 30 (thirty) days.    Past Week at Unknown time  . esomeprazole (NEXIUM) 40 MG capsule TAKE 1 CAPSULE (40 MG TOTAL) BY MOUTH DAILY AT 12 NOON. 90 capsule 0 Past Week at Unknown time  . fentaNYL (DURAGESIC) 50 MCG/HR Place 1 patch onto the skin every 3 (three) days.   06/11/2019 at Unknown time  . finasteride (PROSCAR) 5 MG tablet Take 1 tablet (5 mg total) by mouth daily. 30 tablet 1 06/11/2019 at Unknown time  . fluticasone (FLONASE) 50 MCG/ACT nasal spray PLACE 1 SPRAY INTO BOTH NOSTRILS 2 (TWO) TIMES DAILY AS NEEDED FOR ALLERGIES. 48 g 4 Past Week at Unknown time  . Insulin Glargine, 1 Unit Dial, (TOUJEO SOLOSTAR)  300 UNIT/ML SOPN Inject 60 Units into the skin 2 (two) times daily. (Patient taking differently: Inject 200 Units into the skin daily. ) 130 pen 3 06/11/2019 at Unknown time  . insulin lispro (HUMALOG) 100 UNIT/ML KwikPen Inject 0.25 mLs (25 Units total) into the skin 3 (three) times daily. (Patient taking differently: Inject 50 Units into the skin 3 (three) times daily. ) 45 mL 0 06/11/2019 at Unknown time  . lamoTRIgine (LAMICTAL) 200 MG tablet TAKE 1 TABLET BY MOUTH TWICE A DAY (Patient taking differently: Take 200 mg by mouth 2 (two) times daily. ) 180 tablet 0 06/11/2019 at Unknown time  . levETIRAcetam (KEPPRA) 1000 MG tablet Take 1 tablet (1,000 mg total) by mouth 2 (two) times daily. 180 tablet 3 06/10/2019 at Unknown time  . pregabalin  (LYRICA) 50 MG capsule TAKE 1 CAPSULE BY MOUTH THREE TIMES A DAY (Patient taking differently: Take 50 mg by mouth 3 (three) times daily. TAKE 1 CAPSULE BY MOUTH THREE TIMES A DAY) 270 capsule 1 06/11/2019 at Unknown time  . PROAIR HFA 108 (90 Base) MCG/ACT inhaler TAKE 2 PUFFS BY MOUTH EVERY 6 HOURS AS NEEDED FOR WHEEZE OR SHORTNESS OF BREATH (Patient taking differently: Inhale 2 puffs into the lungs every 6 (six) hours as needed for wheezing or shortness of breath. ) 18 g 0 06/11/2019 at Unknown time  . rizatriptan (MAXALT) 10 MG tablet May repeat in 2 hours if needed (Patient taking differently: Take 10 mg by mouth as needed. May repeat in 2 hours if needed) 10 tablet 11 Past Week at Unknown time  . simvastatin (ZOCOR) 20 MG tablet Take 1 tablet (20 mg total) by mouth at bedtime. 90 tablet 3 06/10/2019 at Unknown time  . tamsulosin (FLOMAX) 0.4 MG CAPS capsule TAKE 1 CAPSULE BY MOUTH EVERY DAY (Patient taking differently: Take 0.4 mg by mouth daily. TAKE 1 CAPSULE BY MOUTH EVERY DAY) 90 capsule 0 06/11/2019 at Unknown time  . TRULICITY 1.5 ZO/1.0RU SOPN Inject 1.5 mg as directed once a week.    Past Week at Unknown time  . vitamin C (VITAMIN C) 500 MG tablet Take 1 tablet (500 mg total) by mouth daily.   06/11/2019 at Unknown time  . Vitamin D, Ergocalciferol, (DRISDOL) 1.25 MG (50000 UT) CAPS capsule Take 50,000 Units by mouth once a week.   06/11/2019 at Unknown time  . warfarin (COUMADIN) 5 MG tablet Take 1 tablet (5 mg total) by mouth daily. Take 23m daily 90 tablet 2 06/10/2019 at 1700  . zinc sulfate 220 (50 Zn) MG capsule Take 1 capsule (220 mg total) by mouth 2 (two) times daily.   06/11/2019 at Unknown time  . EPIPEN 2-PAK 0.3 MG/0.3ML SOAJ injection INJECT 0.3 MLS (0.3 MG TOTAL) INTO THE MUSCLE ONCE. AS NEEDED FOR ANAPHYLACTIC REACTION (Patient taking differently: Inject 0.3 mg into the muscle as needed. ) 2 Device 2   . furosemide (LASIX) 80 MG tablet Take 1 tablet (80 mg total) by mouth 2  (two) times daily. Hold until follow up with nephrology     . HYDROcodone-acetaminophen (NORCO) 7.5-325 MG tablet Take 1 tablet by mouth every 6 (six) hours as needed for moderate pain. 90 tablet 0   . NARCAN 4 MG/0.1ML LIQD nasal spray kit Place 1 spray into the nose once.       Scheduled:  . busPIRone  15 mg Oral BID  . calcium acetate  1,334 mg Oral TID WC  . Chlorhexidine Gluconate Cloth  6 each  Topical Q0600  . darbepoetin (ARANESP) injection - DIALYSIS  150 mcg Intravenous Once  . [START ON 07/07/2019] darbepoetin (ARANESP) injection - DIALYSIS  150 mcg Intravenous Q Fri-HD  . DULoxetine  30 mg Oral Daily  . fentaNYL  1 patch Transdermal Q3 days  . finasteride  5 mg Oral Daily  . hydrocortisone cream   Topical BID  . insulin aspart  0-9 Units Subcutaneous TID WC  . insulin glargine  45 Units Subcutaneous BID  . lamoTRIgine  200 mg Oral BID  . polyethylene glycol  17 g Oral BID  . pregabalin  50 mg Oral BID  . senna  1 tablet Oral QHS  . simvastatin  20 mg Oral QHS  . tamsulosin  0.4 mg Oral Daily  . traZODone  50 mg Oral QHS  . Warfarin - Pharmacist Dosing Inpatient   Does not apply q1800    Assessment: 54 yr old male amitted on 06/10/19 with a PMH significant for multiple DVT/PE. Patient is on warfarin 5 mg daily prior to admission (LD pta taken 06/10/19). INR was therapeutic on admit date. Warfarin was held on admission due to gross hematuria. S/P 5 unit RBCs during this hospitalization.   Patient was on IV heparin from 12/21 to 12/29 and heparin was discontinued when INR was therapeutic on 12/28 after warfarin was resumed on 12/22. Today, INR of 2.7 is therapeutic. Hgb 8.0. Plt 75 which is approximately at the patient's baseline. No reported bleeding.   Goal of Therapy:  INR 2-3 Monitor platelets by anticoagulation protocol: Yes   Plan:  Warfarin 5 mg PO x 1 tonight Monitor daily INR, CBC and S/S of bleeding   Cristela Felt, PharmD PGY1 Pharmacy Resident [employer]:  [phone]  07/01/2019,9:43 AM

## 2019-07-01 NOTE — Progress Notes (Signed)
  [facility] Progress Note    Assessment/ Plan:   1. New ESRD, s/p AVF LUE BC 12/21 and R IJ TDC 12/23; Start HD 12/17, CLIP'd to [facility]. Cont HD MWF schedule, bumped to 07/01/2019, next planned for 07/03/2019. 2. Gross hematuria: s/p uro eval- no heparin with HD 3. Anemia: s/p Fe and on ESA, CTM 4. AMS, improved 5. CKD-BMD: PHosLo, PTH ok, CTM 6. OSA on CPAP 7. Obesity 8. Hx/o PE/DVT, on warfarin 9. Debility, needs aggressive PT/OT and need to see if can successfully transfer from powerchair to HD -- will need Hoyer up to 600 lbs  Subjective:    Bumped from HD schedule yesterday, on for today.  Objective:   BP (!) 114/50   Pulse 68   Temp 97.9 F (36.6 C) (Oral)   Resp 12   Ht 6\' 3"  (1.905 m)   Wt (!) 203 kg   SpO2 97%   BMI 55.94 kg/m   Intake/Output Summary (Last 24 hours) at 07/01/2019 1219 Last data filed at 07/01/2019 0222 Gross per 24 hour  Intake 1360 ml  Output 875 ml  Net 485 ml   Weight change:   Physical Exam: Gen: NAD, obese CVS: RRR Resp: on O2, nonlabored Abd: obese Ext: 2+ LE edema  Imaging: No results found.  Labs: BMET Recent Labs  Lab 06/28/19 1212 06/30/19 0515 07/01/19 0425  NA 131* 134* 135  K 4.5 4.5 4.8  CL 97* 98 97*  CO2 28 26 28   GLUCOSE 183* 140* 165*  BUN 46* 44* 63*  CREATININE 4.92* 4.68* 5.73*  CALCIUM 8.9 8.8* 8.8*  PHOS 4.6  --   --    CBC Recent Labs  Lab 06/28/19 1212 06/29/19 0607 06/30/19 0515 07/01/19 0425  WBC 3.4* 3.3* 3.3* 3.1*  NEUTROABS  --   --  1.7 1.7  HGB 7.6* 7.9* 7.9* 8.0*  HCT 25.6* 25.8* 26.0* 26.0*  MCV 102.8* 101.6* 102.4* 100.8*  PLT 86* 88* 86* 75*    Medications:    . busPIRone  15 mg Oral BID  . calcium acetate  1,334 mg Oral TID WC  . Chlorhexidine Gluconate Cloth  6 each Topical Q0600  . darbepoetin (ARANESP) injection - DIALYSIS  150 mcg Intravenous Once  . [START ON 07/07/2019] darbepoetin (ARANESP) injection - DIALYSIS  150 mcg Intravenous Q Fri-HD  . DULoxetine   30 mg Oral Daily  . fentaNYL  1 patch Transdermal Q3 days  . finasteride  5 mg Oral Daily  . hydrocortisone cream   Topical BID  . insulin aspart  0-9 Units Subcutaneous TID WC  . insulin glargine  45 Units Subcutaneous BID  . lamoTRIgine  200 mg Oral BID  . polyethylene glycol  17 g Oral BID  . pregabalin  50 mg Oral BID  . senna  1 tablet Oral QHS  . simvastatin  20 mg Oral QHS  . tamsulosin  0.4 mg Oral Daily  . traZODone  50 mg Oral QHS  . warfarin  5 mg Oral ONCE-1800  . Warfarin - Pharmacist Dosing Inpatient   Does not apply q1800      Madelon Lips, MD 07/01/2019, 12:18 PM

## 2019-07-01 NOTE — Progress Notes (Signed)
Patient ID: [name], male   DOB: 1965-10-14, 54 y.o.   MRN: [id number]  PROGRESS NOTE    [name]  [id number] DOB: 05-Dec-1965 DOA: 06/11/2019 PCP: Sharion Balloon, FNP   Brief Narrative:  54 year old male with history of chronic kidney disease stage V, chronic hypoxic respiratory failure on 3 L nasal cannula oxygen at home, OSA, OHS, diabetes mellitus type 2, pulmonary embolism, DVT, Covid pneumonia from 04/09/2019-04/25/2019, hospitalization from 06/04/2019-06/08/2023 UTI with gross hematuria, complicated pyelonephritis, acute on chronic CKD presented with inability to void along with pain and was found to have creatinine of 5.42.  He was transferred to [facility] for hemodialysis.  During the hospitalization, he has been started on hemodialysis.  PT is recommending CIR placement but patient refuses it.  He is awaiting outpatient hemodialysis arrangement.  Assessment & Plan:    End-stage renal disease; Initially AKI superimposed on his chronic renal disease stage V -Status post AV fistula placement on 06/19/2019.  Right IJ tunneled dialysis catheter on 06/20/2019 -Started HD on 06/15/2019.  Nephrology following.  Inpatient dialysis as per nephrology schedule.  Outpatient hemodialysis unit is being arranged. -Avoid morphine, baclofen, sodium phosphate, magnesium citrate-based laxative  Gross hematuria with associated acute blood loss anemia: Resolved Hemorrhagic right kidney cyst on CT scan -Seen by urology on 06/15/2019.  Felt to have no need for urgent cystoscopy evaluation given improvement.  Patient poor candidate for embolization versus nephrectomy -Continue conservative management with Foley catheter and finasteride as recommended by Dr. Alyson Ingles.  Outpatient follow-up with urology.  History of DVT/PE on warfarin as an outpatient -Continue Coumadin as per pharmacy.  INR now therapeutic.  Heparin stopped  Diabetes mellitus type 2 uncontrolled with  hyperglycemia -Continue Lantus along with CBGs with SSI.  Anemia of chronic disease/acute blood loss anemia -Status post 5 units packed red cells during the hospitalization.  Currently hemoglobin stable.  Transfuse if hemoglobin is less than 7  Pancytopenia -Stable.  etiology unclear.  Monitor  Chronic hypoxic respiratory failure on 3 to 4 L oxygen via nasal cannula OSA OHS -Stable.  Continue oxygen supplementation  Mood disorder/bipolar disorder -Stable.  Continue BuSpar, Cymbalta, Lamictal, Keppra and trazodone  Seizure disorder -Continue Keppra and Lamictal  Chronic pain syndrome -Stable.  Continue fentanyl patch  Morbid obesity -Outpatient follow-up  Debility, wheelchair-bound at baseline -Overall prognosis is guarded to poor.  Palliative care evaluation for goals of care is pending.  PT recommended CIR.  Patient refused CIR  Left arm rash -Nonspecific.  Resolving with hydrocortisone cream  Mild confusion -Monitor mental status.  Currently stable.  If mental status worsens, might consider holding Lyrica, BuSpar and Cymbalta   DVT prophylaxis: Coumadin Code Status: Full Family Communication: Spoke to patient at bedside Disposition Plan: Refusing CIR.  Home with PT/OT once outpatient hemodialysis unit has been arranged  Consultants: Nephrology/vascular surgery/IR/urology  Procedures:  AV fistula placement on 06/19/2019.  Right IJ tunneled dialysis catheter on 06/20/2019 -Started HD on 06/15/2019  Antimicrobials:  Anti-infectives (From admission, onward)   Start     Dose/Rate Route Frequency Ordered Stop   06/21/19 1257  ceFAZolin (ANCEF) 2-4 GM/100ML-% IVPB    Note to Pharmacy: Rudene Re   : cabinet override      06/21/19 1257 06/21/19 1611   06/21/19 0000  ceFAZolin (ANCEF) IVPB 2g/100 mL premix     2 g 200 mL/hr over 30 Minutes Intravenous To Radiology 06/20/19 1348 06/21/19 1337   06/19/19 0600  cefUROXime (ZINACEF) 1.5 g in  sodium chloride 0.9 % 100  mL IVPB     1.5 g 200 mL/hr over 30 Minutes Intravenous On call to O.R. 06/18/19 0959 06/19/19 1145   06/11/19 2200  amoxicillin-clavulanate (AUGMENTIN) 500-125 MG per tablet 500 mg  Status:  Discontinued     1 tablet Oral 2 times daily 06/11/19 2114 06/14/19 0135       Subjective: Patient seen and examined at bedside.  No overnight worsening shortness of breath, fever, nausea or vomiting.  Poor historian.   Objective: Vitals:   06/30/19 0452 06/30/19 1727 06/30/19 2036 07/01/19 0651  BP: (!) 115/51 (!) 135/52 (!) 128/50 (!) 112/49  Pulse: 66 66 66 67  Resp:  18 20 12   Temp: 98.4 F (36.9 C)  98.5 F (36.9 C) 98.7 F (37.1 C)  TempSrc: Oral  Oral Oral  SpO2: 93% 98% 96% 95%  Weight:      Height:        Intake/Output Summary (Last 24 hours) at 07/01/2019 0810 Last data filed at 07/01/2019 0222 Gross per 24 hour  Intake 1700 ml  Output 1075 ml  Net 625 ml   Filed Weights   06/26/19 2108 06/28/19 1200 06/28/19 2105  Weight: (!) 195.1 kg (!) 198 kg (!) 193.2 kg    Examination:  General exam: No distress.  Looks chronically ill and older than stated age.  Poor historian Respiratory system: Bilateral decreased breath sounds at bases with some crackles.   Cardiovascular system: S1-S2 heard, rate controlled Gastrointestinal system: Abdomen is morbidly obese, nondistended, soft and nontender. Normal bowel sounds heard. Extremities: No cyanosis; bilateral lower extremity edema present    Data Reviewed: I have personally reviewed following labs and imaging studies  CBC: Recent Labs  Lab 06/27/19 0550 06/28/19 1212 06/29/19 0607 06/30/19 0515 07/01/19 0425  WBC 3.2* 3.4* 3.3* 3.3* 3.1*  NEUTROABS  --   --   --  1.7 1.7  HGB 7.6* 7.6* 7.9* 7.9* 8.0*  HCT 24.8* 25.6* 25.8* 26.0* 26.0*  MCV 100.0 102.8* 101.6* 102.4* 100.8*  PLT 81* 86* 88* 86* 75*   Basic Metabolic Panel: Recent Labs  Lab 06/24/19 0912 06/28/19 1212 06/30/19 0515 07/01/19 0425  NA 133* 131* 134*  135  K 4.0 4.5 4.5 4.8  CL 98 97* 98 97*  CO2 26 28 26 28   GLUCOSE 203* 183* 140* 165*  BUN 63* 46* 44* 63*  CREATININE 5.13* 4.92* 4.68* 5.73*  CALCIUM 8.5* 8.9 8.8* 8.8*  PHOS 5.4* 4.6  --   --    GFR: Estimated Creatinine Clearance: 27.3 mL/min (A) (by C-G formula based on SCr of 5.73 mg/dL (H)). Liver Function Tests: Recent Labs  Lab 06/24/19 0912 06/28/19 1212  ALBUMIN 2.4* 2.4*   No results for input(s): LIPASE, AMYLASE in the last 168 hours. No results for input(s): AMMONIA in the last 168 hours. Coagulation Profile: Recent Labs  Lab 06/27/19 0550 06/28/19 0502 06/29/19 0607 06/30/19 0515 07/01/19 0425  INR 2.2* 2.4* 2.7* 2.5* 2.7*   Cardiac Enzymes: No results for input(s): CKTOTAL, CKMB, CKMBINDEX, TROPONINI in the last 168 hours. BNP (last 3 results) No results for input(s): PROBNP in the last 8760 hours. HbA1C: No results for input(s): HGBA1C in the last 72 hours. CBG: Recent Labs  Lab 06/30/19 0720 06/30/19 1123 06/30/19 1621 06/30/19 2034 07/01/19 0653  GLUCAP 135* 206* 154* 190* 140*   Lipid Profile: No results for input(s): CHOL, HDL, LDLCALC, TRIG, CHOLHDL, LDLDIRECT in the last 72 hours. Thyroid Function Tests: No  results for input(s): TSH, T4TOTAL, FREET4, T3FREE, THYROIDAB in the last 72 hours. Anemia Panel: No results for input(s): VITAMINB12, FOLATE, FERRITIN, TIBC, IRON, RETICCTPCT in the last 72 hours. Sepsis Labs: No results for input(s): PROCALCITON, LATICACIDVEN in the last 168 hours.  No results found for this or any previous visit (from the past 240 hour(s)).       Radiology Studies: No results found.      Scheduled Meds: . busPIRone  15 mg Oral BID  . calcium acetate  1,334 mg Oral TID WC  . Chlorhexidine Gluconate Cloth  6 each Topical Q0600  . darbepoetin (ARANESP) injection - DIALYSIS  150 mcg Intravenous Once  . [START ON 07/07/2019] darbepoetin (ARANESP) injection - DIALYSIS  150 mcg Intravenous Q Fri-HD  .  DULoxetine  30 mg Oral Daily  . fentaNYL  1 patch Transdermal Q3 days  . finasteride  5 mg Oral Daily  . hydrocortisone cream   Topical BID  . insulin aspart  0-9 Units Subcutaneous TID WC  . insulin glargine  45 Units Subcutaneous BID  . lamoTRIgine  200 mg Oral BID  . polyethylene glycol  17 g Oral BID  . pregabalin  50 mg Oral BID  . senna  1 tablet Oral QHS  . simvastatin  20 mg Oral QHS  . tamsulosin  0.4 mg Oral Daily  . traZODone  50 mg Oral QHS  . Warfarin - Pharmacist Dosing Inpatient   Does not apply q1800   Continuous Infusions: . sodium chloride 250 mL (06/15/19 1130)  . sodium chloride            Aline August, MD [facility] 07/01/2019, 8:10 AM

## 2019-07-01 NOTE — Progress Notes (Signed)
Pt too sleepy to take meds at this time. RN will try again in a little while.   Eleanora Neighbor, RN

## 2019-07-02 LAB — BASIC METABOLIC PANEL
Anion gap: 7 (ref 5–15)
BUN: 36 mg/dL — ABNORMAL HIGH (ref 6–20)
CO2: 29 mmol/L (ref 22–32)
Calcium: 8.5 mg/dL — ABNORMAL LOW (ref 8.9–10.3)
Chloride: 99 mmol/L (ref 98–111)
Creatinine, Ser: 3.81 mg/dL — ABNORMAL HIGH (ref 0.61–1.24)
GFR calc Af Amer: 20 mL/min — ABNORMAL LOW (ref >60)
GFR calc non Af Amer: 17 mL/min — ABNORMAL LOW (ref >60)
Glucose, Bld: 146 mg/dL — ABNORMAL HIGH (ref 70–99)
Potassium: 4.6 mmol/L (ref 3.5–5.1)
Sodium: 135 mmol/L (ref 135–145)

## 2019-07-02 LAB — CBC
HCT: 26.4 % — ABNORMAL LOW (ref 39.0–52.0)
Hemoglobin: 8.1 g/dL — ABNORMAL LOW (ref 13.0–17.0)
MCH: 30.8 pg (ref 26.0–34.0)
MCHC: 30.7 g/dL (ref 30.0–36.0)
MCV: 100.4 fL — ABNORMAL HIGH (ref 80.0–100.0)
Platelets: 72 10*3/uL — ABNORMAL LOW (ref 150–400)
RBC: 2.63 MIL/uL — ABNORMAL LOW (ref 4.22–5.81)
RDW: 14.9 % (ref 11.5–15.5)
WBC: 3 10*3/uL — ABNORMAL LOW (ref 4.0–10.5)
nRBC: 0 % (ref 0.0–0.2)

## 2019-07-02 LAB — PROTIME-INR
INR: 2.6 — ABNORMAL HIGH (ref 0.8–1.2)
Prothrombin Time: 27.9 seconds — ABNORMAL HIGH (ref 11.4–15.2)

## 2019-07-02 LAB — GLUCOSE, CAPILLARY
Glucose-Capillary: 117 mg/dL — ABNORMAL HIGH (ref 70–99)
Glucose-Capillary: 156 mg/dL — ABNORMAL HIGH (ref 70–99)
Glucose-Capillary: 150 mg/dL — ABNORMAL HIGH (ref 70–99)
Glucose-Capillary: 196 mg/dL — ABNORMAL HIGH (ref 70–99)

## 2019-07-02 MED ORDER — WARFARIN SODIUM 5 MG PO TABS
5.0000 mg | ORAL_TABLET | Freq: Once | ORAL | Status: AC
  Administered 2019-07-02: 5 mg via ORAL
  Filled 2019-07-02: qty 1

## 2019-07-02 MED ORDER — LEVETIRACETAM 500 MG PO TABS
500.0000 mg | ORAL_TABLET | Freq: Two times a day (BID) | ORAL | Status: DC
  Administered 2019-07-02 – 2019-07-03 (×2): 500 mg via ORAL
  Filled 2019-07-02 (×2): qty 1

## 2019-07-02 NOTE — Progress Notes (Signed)
ANTICOAGULATION CONSULT NOTE - Follow Up Consult  Pharmacy Consult for Warfarin Indication: h/o pulmonary embolus and DVT  Allergies  Allergen Reactions  . Bee Venom Anaphylaxis, Shortness Of Breath and Swelling  . Other Shortness Of Breath    Itching, rash with IVP DYE, iodine, shellfish LATEX  . Shellfish Allergy Nausea And Vomiting and Other (See Comments)    Feels like insides are twisting  . Iodinated Diagnostic Agents     Other reaction(s): RASH  . Iohexol      Code: RASH, Desc: PT WAS ON PREDNISONE FOR GOUT TX. @ TIME OF SCAN AND RECEIVED 50 MG OF BENADRYL IV-ARS 10/08/07   . Iodine Rash  . Latex Rash    Patient Measurements: Height: 6' 3"  (190.5 cm) Weight: (!) 447 lb 8.5 oz (203 kg) IBW/kg (Calculated) : 84.5  Vital Signs: Temp: 98.6 F (37 C) (01/03 0900) Temp Source: Oral (01/03 0900) BP: 116/56 (01/03 0900) Pulse Rate: 72 (01/03 0900)  Labs: Recent Labs    06/30/19 0515 07/01/19 0425 07/02/19 0430  HGB 7.9* 8.0* 8.1*  HCT 26.0* 26.0* 26.4*  PLT 86* 75* 72*  LABPROT 27.2* 28.6* 27.9*  INR 2.5* 2.7* 2.6*  CREATININE 4.68* 5.73* 3.81*    Estimated Creatinine Clearance: 42.3 mL/min (A) (by C-G formula based on SCr of 3.81 mg/dL (H)).   Medications:  Medications Prior to Admission  Medication Sig Dispense Refill Last Dose  . [EXPIRED] amoxicillin-clavulanate (AUGMENTIN) 500-125 MG tablet Take 1 tablet (500 mg total) by mouth 2 (two) times daily for 7 days. 14 tablet 0 06/11/2019 at Unknown time  . busPIRone (BUSPAR) 15 MG tablet TAKE 1 TABLET (15 MG TOTAL) BY MOUTH 2 (TWO) TIMES DAILY. (Patient taking differently: Take 15 mg by mouth 2 (two) times daily. TAKE 1 TABLET (15 MG TOTAL) BY MOUTH 2 (TWO) TIMES DAILY.) 180 tablet 0 06/11/2019 at Unknown time  . calcitRIOL (ROCALTROL) 0.25 MCG capsule Take 0.25 mcg by mouth daily.   Past Week at Unknown time  . cetirizine (ZYRTEC) 10 MG tablet Take 1 tablet (10 mg total) by mouth daily. 30 tablet 11 Past Week at  Unknown time  . clomiPHENE (CLOMID) 50 MG tablet Take 0.5 tablets (25 mg total) by mouth every other day. 24 tablet 3 Past Week at Unknown time  . docusate sodium (COLACE) 100 MG capsule Take 1 capsule (100 mg total) by mouth 2 (two) times daily. 10 capsule 0 Past Week at Unknown time  . DULoxetine (CYMBALTA) 60 MG capsule TAKE 1 CAPSULE BY MOUTH EVERY DAY (Patient taking differently: Take 60 mg by mouth daily. ) 90 capsule 0 Past Week at Unknown time  . EMGALITY 120 MG/ML SOAJ Inject 120 mg as directed every 30 (thirty) days.    Past Week at Unknown time  . esomeprazole (NEXIUM) 40 MG capsule TAKE 1 CAPSULE (40 MG TOTAL) BY MOUTH DAILY AT 12 NOON. 90 capsule 0 Past Week at Unknown time  . fentaNYL (DURAGESIC) 50 MCG/HR Place 1 patch onto the skin every 3 (three) days.   06/11/2019 at Unknown time  . finasteride (PROSCAR) 5 MG tablet Take 1 tablet (5 mg total) by mouth daily. 30 tablet 1 06/11/2019 at Unknown time  . fluticasone (FLONASE) 50 MCG/ACT nasal spray PLACE 1 SPRAY INTO BOTH NOSTRILS 2 (TWO) TIMES DAILY AS NEEDED FOR ALLERGIES. 48 g 4 Past Week at Unknown time  . Insulin Glargine, 1 Unit Dial, (TOUJEO SOLOSTAR) 300 UNIT/ML SOPN Inject 60 Units into the skin 2 (two) times  daily. (Patient taking differently: Inject 200 Units into the skin daily. ) 130 pen 3 06/11/2019 at Unknown time  . insulin lispro (HUMALOG) 100 UNIT/ML KwikPen Inject 0.25 mLs (25 Units total) into the skin 3 (three) times daily. (Patient taking differently: Inject 50 Units into the skin 3 (three) times daily. ) 45 mL 0 06/11/2019 at Unknown time  . lamoTRIgine (LAMICTAL) 200 MG tablet TAKE 1 TABLET BY MOUTH TWICE A DAY (Patient taking differently: Take 200 mg by mouth 2 (two) times daily. ) 180 tablet 0 06/11/2019 at Unknown time  . levETIRAcetam (KEPPRA) 1000 MG tablet Take 1 tablet (1,000 mg total) by mouth 2 (two) times daily. 180 tablet 3 06/10/2019 at Unknown time  . pregabalin (LYRICA) 50 MG capsule TAKE 1 CAPSULE BY  MOUTH THREE TIMES A DAY (Patient taking differently: Take 50 mg by mouth 3 (three) times daily. TAKE 1 CAPSULE BY MOUTH THREE TIMES A DAY) 270 capsule 1 06/11/2019 at Unknown time  . PROAIR HFA 108 (90 Base) MCG/ACT inhaler TAKE 2 PUFFS BY MOUTH EVERY 6 HOURS AS NEEDED FOR WHEEZE OR SHORTNESS OF BREATH (Patient taking differently: Inhale 2 puffs into the lungs every 6 (six) hours as needed for wheezing or shortness of breath. ) 18 g 0 06/11/2019 at Unknown time  . rizatriptan (MAXALT) 10 MG tablet May repeat in 2 hours if needed (Patient taking differently: Take 10 mg by mouth as needed. May repeat in 2 hours if needed) 10 tablet 11 Past Week at Unknown time  . simvastatin (ZOCOR) 20 MG tablet Take 1 tablet (20 mg total) by mouth at bedtime. 90 tablet 3 06/10/2019 at Unknown time  . tamsulosin (FLOMAX) 0.4 MG CAPS capsule TAKE 1 CAPSULE BY MOUTH EVERY DAY (Patient taking differently: Take 0.4 mg by mouth daily. TAKE 1 CAPSULE BY MOUTH EVERY DAY) 90 capsule 0 06/11/2019 at Unknown time  . TRULICITY 1.5 HA/1.9FX SOPN Inject 1.5 mg as directed once a week.    Past Week at Unknown time  . vitamin C (VITAMIN C) 500 MG tablet Take 1 tablet (500 mg total) by mouth daily.   06/11/2019 at Unknown time  . Vitamin D, Ergocalciferol, (DRISDOL) 1.25 MG (50000 UT) CAPS capsule Take 50,000 Units by mouth once a week.   06/11/2019 at Unknown time  . warfarin (COUMADIN) 5 MG tablet Take 1 tablet (5 mg total) by mouth daily. Take 52m daily 90 tablet 2 06/10/2019 at 1700  . zinc sulfate 220 (50 Zn) MG capsule Take 1 capsule (220 mg total) by mouth 2 (two) times daily.   06/11/2019 at Unknown time  . EPIPEN 2-PAK 0.3 MG/0.3ML SOAJ injection INJECT 0.3 MLS (0.3 MG TOTAL) INTO THE MUSCLE ONCE. AS NEEDED FOR ANAPHYLACTIC REACTION (Patient taking differently: Inject 0.3 mg into the muscle as needed. ) 2 Device 2   . furosemide (LASIX) 80 MG tablet Take 1 tablet (80 mg total) by mouth 2 (two) times daily. Hold until follow up  with nephrology     . HYDROcodone-acetaminophen (NORCO) 7.5-325 MG tablet Take 1 tablet by mouth every 6 (six) hours as needed for moderate pain. 90 tablet 0   . NARCAN 4 MG/0.1ML LIQD nasal spray kit Place 1 spray into the nose once.       Scheduled:  . busPIRone  15 mg Oral BID  . calcium acetate  1,334 mg Oral TID WC  . Chlorhexidine Gluconate Cloth  6 each Topical Q0600  . [START ON 07/07/2019] darbepoetin (ARANESP) injection - DIALYSIS  150 mcg Intravenous Q Fri-HD  . DULoxetine  30 mg Oral Daily  . fentaNYL  1 patch Transdermal Q3 days  . finasteride  5 mg Oral Daily  . hydrocortisone cream   Topical BID  . insulin aspart  0-9 Units Subcutaneous TID WC  . insulin glargine  45 Units Subcutaneous BID  . lamoTRIgine  200 mg Oral BID  . polyethylene glycol  17 g Oral BID  . pregabalin  50 mg Oral BID  . senna  1 tablet Oral QHS  . simvastatin  20 mg Oral QHS  . tamsulosin  0.4 mg Oral Daily  . traZODone  50 mg Oral QHS  . Warfarin - Pharmacist Dosing Inpatient   Does not apply q1800    Assessment: 54 yr old male amitted on 06/10/19 with a PMH significant for multiple DVT/PE. Patient is on warfarin 5 mg daily prior to admission (LD pta taken 06/10/19). INR was therapeutic on admit date. Warfarin was held on admission due to gross hematuria. S/P 5 unit RBCs during this hospitalization.   Patient was on IV heparin from 12/21 to 12/29 and heparin was discontinued when INR was therapeutic on 12/28 after warfarin was resumed on 12/22. Today, INR of 2.6 is therapeutic. Hgb 8.1. Plt 72 which is approximately at the patient's baseline. No reported bleeding.   Goal of Therapy:  INR 2-3 Monitor platelets by anticoagulation protocol: Yes   Plan:  Warfarin 5 mg PO x 1 tonight Monitor daily INR, CBC and S/S of bleeding   Cristela Felt, PharmD PGY1 Pharmacy Resident [employer]: [phone]  07/02/2019,10:45 AM

## 2019-07-02 NOTE — Progress Notes (Signed)
  [facility] Progress Note    Assessment/ Plan:   1. New ESRD, s/p AVF LUE BC 12/21 and R IJ TDC 12/23; Start HD 12/17, CLIP'd to [facility]. Cont HD MWF schedule, bumped to 07/01/2019, next planned for 07/03/2019.  Will place on second shift in case dispo plans firmed up in the AM.   2. Gross hematuria: s/p uro eval- no heparin with HD 3. Anemia: s/p Fe and on Darbe 150 q Friday 4. AMS, improved 5. CKD-BMD: PHosLo, PTH ok, CTM 6. OSA on CPAP 7. Obesity 8. Hx/o PE/DVT, on warfarin 9. Debility, needs aggressive PT/OT and need to see if can successfully transfer from powerchair to HD -- will need Hoyer up to 600 lbs  Subjective:    S/p HD yesterday with good UF goal.    Objective:   BP (!) 116/56 (BP Location: Right Wrist)   Pulse 72   Temp 98.6 F (37 C) (Oral)   Resp 16   Ht 6\' 3"  (1.905 m)   Wt (!) 203 kg   SpO2 93%   BMI 55.94 kg/m   Intake/Output Summary (Last 24 hours) at 07/02/2019 1228 Last data filed at 07/02/2019 0900 Gross per 24 hour  Intake 1122 ml  Output 5000 ml  Net -3878 ml   Weight change:   Physical Exam: Gen: NAD, obese CVS: RRR Resp: on O2, nonlabored Abd: obese Ext: 2+ LE edema  Imaging: No results found.  Labs: BMET Recent Labs  Lab 06/28/19 1212 06/30/19 0515 07/01/19 0425 07/02/19 0430  NA 131* 134* 135 135  K 4.5 4.5 4.8 4.6  CL 97* 98 97* 99  CO2 28 26 28 29   GLUCOSE 183* 140* 165* 146*  BUN 46* 44* 63* 36*  CREATININE 4.92* 4.68* 5.73* 3.81*  CALCIUM 8.9 8.8* 8.8* 8.5*  PHOS 4.6  --   --   --    CBC Recent Labs  Lab 06/29/19 0607 06/30/19 0515 07/01/19 0425 07/02/19 0430  WBC 3.3* 3.3* 3.1* 3.0*  NEUTROABS  --  1.7 1.7  --   HGB 7.9* 7.9* 8.0* 8.1*  HCT 25.8* 26.0* 26.0* 26.4*  MCV 101.6* 102.4* 100.8* 100.4*  PLT 88* 86* 75* 72*    Medications:    . busPIRone  15 mg Oral BID  . calcium acetate  1,334 mg Oral TID WC  . Chlorhexidine Gluconate Cloth  6 each Topical Q0600  . [START ON 07/07/2019]  darbepoetin (ARANESP) injection - DIALYSIS  150 mcg Intravenous Q Fri-HD  . DULoxetine  30 mg Oral Daily  . fentaNYL  1 patch Transdermal Q3 days  . finasteride  5 mg Oral Daily  . hydrocortisone cream   Topical BID  . insulin aspart  0-9 Units Subcutaneous TID WC  . insulin glargine  45 Units Subcutaneous BID  . lamoTRIgine  200 mg Oral BID  . polyethylene glycol  17 g Oral BID  . pregabalin  50 mg Oral BID  . senna  1 tablet Oral QHS  . simvastatin  20 mg Oral QHS  . tamsulosin  0.4 mg Oral Daily  . traZODone  50 mg Oral QHS  . warfarin  5 mg Oral ONCE-1800  . Warfarin - Pharmacist Dosing Inpatient   Does not apply q1800      Madelon Lips, MD 07/02/2019, 12:28 PM

## 2019-07-02 NOTE — Progress Notes (Signed)
Patient ID: [name], male   DOB: Sep 02, 1965, 54 y.o.   MRN: [id number]  PROGRESS NOTE    [name]  [id number] DOB: 1965/09/12 DOA: 06/11/2019 PCP: Sharion Balloon, FNP   Brief Narrative:  54 year old male with history of chronic kidney disease stage V, chronic hypoxic respiratory failure on 3 L nasal cannula oxygen at home, OSA, OHS, diabetes mellitus type 2, pulmonary embolism, DVT, Covid pneumonia from 04/09/2019-04/25/2019, hospitalization from 06/04/2019-06/08/2023 UTI with gross hematuria, complicated pyelonephritis, acute on chronic CKD presented with inability to void along with pain and was found to have creatinine of 5.42.  He was transferred to [facility] for hemodialysis.  During the hospitalization, he has been started on hemodialysis.  PT is recommending CIR placement but patient refuses it.  He is awaiting outpatient hemodialysis arrangement.  Assessment & Plan:    End-stage renal disease; Initially AKI superimposed on his chronic renal disease stage V -Status post AV fistula placement on 06/19/2019.  Right IJ tunneled dialysis catheter on 06/20/2019 -Started HD on 06/15/2019.  Nephrology following.  Inpatient dialysis as per nephrology schedule.  Outpatient hemodialysis unit is being arranged. -Avoid morphine, baclofen, sodium phosphate, magnesium citrate-based laxative  Gross hematuria with associated acute blood loss anemia: Resolved Hemorrhagic right kidney cyst on CT scan -Seen by urology on 06/15/2019.  Felt to have no need for urgent cystoscopy evaluation given improvement.  Patient poor candidate for embolization versus nephrectomy -Continue conservative management with Foley catheter and finasteride as recommended by Dr. Alyson Ingles.  Outpatient follow-up with urology.  History of DVT/PE on warfarin as an outpatient -Continue Coumadin as per pharmacy.  INR now therapeutic.  Heparin stopped  Diabetes mellitus type 2 uncontrolled with  hyperglycemia -Continue Lantus along with CBGs with SSI.  Anemia of chronic disease/acute blood loss anemia -Status post 5 units packed red cells during the hospitalization.  Currently hemoglobin stable.  Transfuse if hemoglobin is less than 7  Pancytopenia -Stable.  etiology unclear.  Monitor  Chronic hypoxic respiratory failure on 3 to 4 L oxygen via nasal cannula OSA OHS -Stable.  Continue oxygen supplementation  Mood disorder/bipolar disorder -Stable.  Continue BuSpar, Cymbalta, Lamictal, Keppra and trazodone  Seizure disorder -Continue Keppra and Lamictal  Chronic pain syndrome -Stable.  Continue fentanyl patch  Morbid obesity -Outpatient follow-up  Debility, wheelchair-bound at baseline -Overall prognosis is guarded to poor.  Palliative care evaluation for goals of care is pending.  PT recommended CIR.  Patient refused CIR  Left arm rash -Nonspecific.  Resolving with hydrocortisone cream  Mild confusion -Monitor mental status.  Currently stable.  If mental status worsens, might consider holding Lyrica, BuSpar and Cymbalta   DVT prophylaxis: Coumadin Code Status: Full Family Communication: Spoke to patient at bedside Disposition Plan: Refusing CIR.  Home with PT/OT once outpatient hemodialysis unit has been arranged  Consultants: Nephrology/vascular surgery/IR/urology  Procedures:  AV fistula placement on 06/19/2019.  Right IJ tunneled dialysis catheter on 06/20/2019 -Started HD on 06/15/2019  Antimicrobials:  Anti-infectives (From admission, onward)   Start     Dose/Rate Route Frequency Ordered Stop   06/21/19 1257  ceFAZolin (ANCEF) 2-4 GM/100ML-% IVPB    Note to Pharmacy: Rudene Re   : cabinet override      06/21/19 1257 06/21/19 1611   06/21/19 0000  ceFAZolin (ANCEF) IVPB 2g/100 mL premix     2 g 200 mL/hr over 30 Minutes Intravenous To Radiology 06/20/19 1348 06/21/19 1337   06/19/19 0600  cefUROXime (ZINACEF) 1.5 g in  sodium chloride 0.9 % 100  mL IVPB     1.5 g 200 mL/hr over 30 Minutes Intravenous On call to O.R. 06/18/19 0959 06/19/19 1145   06/11/19 2200  amoxicillin-clavulanate (AUGMENTIN) 500-125 MG per tablet 500 mg  Status:  Discontinued     1 tablet Oral 2 times daily 06/11/19 2114 06/14/19 0135       Subjective: Patient seen and examined at bedside.  Poor historian.  Denies worsening shortness of breath, vomiting or fevers. Objective: Vitals:   07/01/19 1340 07/01/19 1424 07/01/19 2133 07/02/19 0528  BP: (!) 128/54 (!) 112/42 (!) 135/48 (!) 111/52  Pulse: 70 71 72 68  Resp: 15 16 16 12   Temp: 97.9 F (36.6 C) 98.7 F (37.1 C) 98.3 F (36.8 C) 99.3 F (37.4 C)  TempSrc:  Oral Oral Oral  SpO2: 96% 96% 95% 91%  Weight:      Height:        Intake/Output Summary (Last 24 hours) at 07/02/2019 0806 Last data filed at 07/02/2019 0556 Gross per 24 hour  Intake 1002 ml  Output 5000 ml  Net -3998 ml   Filed Weights   06/28/19 1200 06/28/19 2105 07/01/19 0918  Weight: (!) 198 kg (!) 193.2 kg (!) 203 kg    Examination:  General exam: No acute distress.  Looks chronically ill and older than stated age.  Poor historian Respiratory system: Bilateral decreased breath sounds at bases with scattered crackles.   Cardiovascular system: Rate controlled, S1-S2 heard Gastrointestinal system: Abdomen is morbidly obese, nondistended, soft and nontender. Normal bowel sounds heard. Extremities: No cyanosis; bilateral lower extremity edema present    Data Reviewed: I have personally reviewed following labs and imaging studies  CBC: Recent Labs  Lab 06/28/19 1212 06/29/19 0607 06/30/19 0515 07/01/19 0425 07/02/19 0430  WBC 3.4* 3.3* 3.3* 3.1* 3.0*  NEUTROABS  --   --  1.7 1.7  --   HGB 7.6* 7.9* 7.9* 8.0* 8.1*  HCT 25.6* 25.8* 26.0* 26.0* 26.4*  MCV 102.8* 101.6* 102.4* 100.8* 100.4*  PLT 86* 88* 86* 75* 72*   Basic Metabolic Panel: Recent Labs  Lab 06/28/19 1212 06/30/19 0515 07/01/19 0425 07/02/19 0430    NA 131* 134* 135 135  K 4.5 4.5 4.8 4.6  CL 97* 98 97* 99  CO2 28 26 28 29   GLUCOSE 183* 140* 165* 146*  BUN 46* 44* 63* 36*  CREATININE 4.92* 4.68* 5.73* 3.81*  CALCIUM 8.9 8.8* 8.8* 8.5*  PHOS 4.6  --   --   --    GFR: Estimated Creatinine Clearance: 42.3 mL/min (A) (by C-G formula based on SCr of 3.81 mg/dL (H)). Liver Function Tests: Recent Labs  Lab 06/28/19 1212  ALBUMIN 2.4*   No results for input(s): LIPASE, AMYLASE in the last 168 hours. No results for input(s): AMMONIA in the last 168 hours. Coagulation Profile: Recent Labs  Lab 06/28/19 0502 06/29/19 0607 06/30/19 0515 07/01/19 0425 07/02/19 0430  INR 2.4* 2.7* 2.5* 2.7* 2.6*   Cardiac Enzymes: No results for input(s): CKTOTAL, CKMB, CKMBINDEX, TROPONINI in the last 168 hours. BNP (last 3 results) No results for input(s): PROBNP in the last 8760 hours. HbA1C: No results for input(s): HGBA1C in the last 72 hours. CBG: Recent Labs  Lab 07/01/19 0653 07/01/19 1432 07/01/19 1654 07/01/19 2130 07/02/19 0707  GLUCAP 140* 124* 185* 201* 117*   Lipid Profile: No results for input(s): CHOL, HDL, LDLCALC, TRIG, CHOLHDL, LDLDIRECT in the last 72 hours. Thyroid Function Tests: No  results for input(s): TSH, T4TOTAL, FREET4, T3FREE, THYROIDAB in the last 72 hours. Anemia Panel: No results for input(s): VITAMINB12, FOLATE, FERRITIN, TIBC, IRON, RETICCTPCT in the last 72 hours. Sepsis Labs: No results for input(s): PROCALCITON, LATICACIDVEN in the last 168 hours.  No results found for this or any previous visit (from the past 240 hour(s)).       Radiology Studies: No results found.      Scheduled Meds: . busPIRone  15 mg Oral BID  . calcium acetate  1,334 mg Oral TID WC  . Chlorhexidine Gluconate Cloth  6 each Topical Q0600  . [START ON 07/07/2019] darbepoetin (ARANESP) injection - DIALYSIS  150 mcg Intravenous Q Fri-HD  . DULoxetine  30 mg Oral Daily  . fentaNYL  1 patch Transdermal Q3 days  .  finasteride  5 mg Oral Daily  . hydrocortisone cream   Topical BID  . insulin aspart  0-9 Units Subcutaneous TID WC  . insulin glargine  45 Units Subcutaneous BID  . lamoTRIgine  200 mg Oral BID  . polyethylene glycol  17 g Oral BID  . pregabalin  50 mg Oral BID  . senna  1 tablet Oral QHS  . simvastatin  20 mg Oral QHS  . tamsulosin  0.4 mg Oral Daily  . traZODone  50 mg Oral QHS  . Warfarin - Pharmacist Dosing Inpatient   Does not apply q1800   Continuous Infusions: . sodium chloride 250 mL (06/15/19 1130)  . sodium chloride            Aline August, MD [facility] 07/02/2019, 8:06 AM

## 2019-07-03 LAB — GLUCOSE, CAPILLARY
Glucose-Capillary: 162 mg/dL — ABNORMAL HIGH (ref 70–99)
Glucose-Capillary: 143 mg/dL — ABNORMAL HIGH (ref 70–99)

## 2019-07-03 LAB — RENAL FUNCTION PANEL
Albumin: 2.6 g/dL — ABNORMAL LOW (ref 3.5–5.0)
Anion gap: 10 (ref 5–15)
BUN: 62 mg/dL — ABNORMAL HIGH (ref 6–20)
CO2: 27 mmol/L (ref 22–32)
Calcium: 8.8 mg/dL — ABNORMAL LOW (ref 8.9–10.3)
Chloride: 99 mmol/L (ref 98–111)
Creatinine, Ser: 5.02 mg/dL — ABNORMAL HIGH (ref 0.61–1.24)
GFR calc Af Amer: 14 mL/min — ABNORMAL LOW (ref >60)
GFR calc non Af Amer: 12 mL/min — ABNORMAL LOW (ref >60)
Glucose, Bld: 175 mg/dL — ABNORMAL HIGH (ref 70–99)
Phosphorus: 5.9 mg/dL — ABNORMAL HIGH (ref 2.5–4.6)
Potassium: 4.6 mmol/L (ref 3.5–5.1)
Sodium: 136 mmol/L (ref 135–145)

## 2019-07-03 LAB — CBC
HCT: 26.4 % — ABNORMAL LOW (ref 39.0–52.0)
Hemoglobin: 8.1 g/dL — ABNORMAL LOW (ref 13.0–17.0)
MCH: 30.8 pg (ref 26.0–34.0)
MCHC: 30.7 g/dL (ref 30.0–36.0)
MCV: 100.4 fL — ABNORMAL HIGH (ref 80.0–100.0)
Platelets: 67 10*3/uL — ABNORMAL LOW (ref 150–400)
RBC: 2.63 MIL/uL — ABNORMAL LOW (ref 4.22–5.81)
RDW: 14.8 % (ref 11.5–15.5)
WBC: 2.9 10*3/uL — ABNORMAL LOW (ref 4.0–10.5)
nRBC: 0 % (ref 0.0–0.2)

## 2019-07-03 LAB — PROTIME-INR
INR: 2.9 — ABNORMAL HIGH (ref 0.8–1.2)
Prothrombin Time: 30.5 seconds — ABNORMAL HIGH (ref 11.4–15.2)

## 2019-07-03 MED ORDER — SENNA 8.6 MG PO TABS: 1.0000 | ORAL_TABLET | Freq: Every day | ORAL | 0 refills | Status: AC

## 2019-07-03 MED ORDER — HEPARIN SODIUM (PORCINE) 1000 UNIT/ML IJ SOLN
INTRAMUSCULAR | Status: AC
  Administered 2019-07-03: 3800 [IU]
  Filled 2019-07-03: qty 4

## 2019-07-03 MED ORDER — POLYETHYLENE GLYCOL 3350 17 G PO PACK: 17.0000 g | PACK | Freq: Two times a day (BID) | ORAL | 0 refills | Status: AC

## 2019-07-03 MED ORDER — RIZATRIPTAN BENZOATE 10 MG PO TABS: 10.0000 mg | ORAL_TABLET | ORAL | Status: AC | PRN

## 2019-07-03 MED ORDER — WARFARIN SODIUM 4 MG PO TABS
4.0000 mg | ORAL_TABLET | Freq: Once | ORAL | Status: AC
  Administered 2019-07-03: 4 mg via ORAL
  Filled 2019-07-03: qty 1

## 2019-07-03 MED ORDER — PREGABALIN 50 MG PO CAPS: 50.0000 mg | ORAL_CAPSULE | Freq: Two times a day (BID) | ORAL | Status: AC

## 2019-07-03 MED ORDER — HYDROCODONE-ACETAMINOPHEN 5-325 MG PO TABS
ORAL_TABLET | ORAL | Status: AC
  Filled 2019-07-03: qty 1

## 2019-07-03 MED ORDER — CALCIUM ACETATE (PHOS BINDER) 667 MG PO CAPS: 1334.0000 mg | ORAL_CAPSULE | Freq: Three times a day (TID) | ORAL | 0 refills | Status: DC

## 2019-07-03 MED ORDER — LEVETIRACETAM 500 MG PO TABS: 500.0000 mg | ORAL_TABLET | Freq: Two times a day (BID) | ORAL | 0 refills | Status: DC

## 2019-07-03 MED ORDER — TOUJEO SOLOSTAR 300 UNIT/ML ~~LOC~~ SOPN: 45.0000 [IU] | PEN_INJECTOR | Freq: Two times a day (BID) | SUBCUTANEOUS | Status: AC

## 2019-07-03 MED ORDER — WARFARIN SODIUM 4 MG PO TABS: 4.0000 mg | ORAL_TABLET | Freq: Once | ORAL | 0 refills | Status: DC

## 2019-07-03 NOTE — Discharge Summary (Signed)
Physician Discharge Summary  [name] GLO:[id number] DOB: 1965/11/27 DOA: 06/11/2019  PCP: Sharion Balloon, FNP  Admit date: 06/11/2019 Discharge date: 07/03/2019  Admitted From: Home Disposition: Home.  Refused CIR  Recommendations for Outpatient Follow-up:  1. Follow up with PCP in 1 week with repeat INR at latest convenience.  Coumadin to be dosed by PCP depending on INR levels 2. Outpatient follow-up with dialysis unit as scheduled 3. Outpatient follow-up with urology 4. Outpatient evaluation and follow-up by palliative care 5. Follow up in ED if symptoms worsen or new appear   Home Health: PT/OT Equipment/Devices: Nasal cannula oxygen at 2 to 4 L/min.  Patient was on oxygen at home prior to presentation.  Discharge Condition: Guarded to poor CODE STATUS: Full Diet recommendation: Heart healthy/carb modified/renal hemodialysis diet  Brief/Interim Summary: 54 year old male with history of chronic kidney disease stage V, chronic hypoxic respiratory failure on 3 L nasal cannula oxygen at home, OSA, OHS, diabetes mellitus type 2, pulmonary embolism, DVT, Covid pneumonia from 04/09/2019-04/25/2019, hospitalization from 06/04/2019-06/08/2023 UTI with gross hematuria, complicated pyelonephritis, acute on chronic CKD presented with inability to void along with pain and was found to have creatinine of 5.42.  He was transferred to [facility] for hemodialysis.  During the hospitalization, he has been started on hemodialysis.  PT is recommending CIR placement but patient refuses it.  He is awaiting outpatient hemodialysis arrangement.  Outpatient hemodialysis regimen has made.  He will be discharged home today.   Discharge Diagnoses:   End-stage renal disease; Initially AKI superimposed on his chronic renal disease stage V -Status post AV fistula placement on 06/19/2019.  Right IJ tunneled dialysis catheter on 06/20/2019 -Started HD on 06/15/2019.  Nephrology following.     Patient received inpatient dialysis as per nephrology schedule.  Outpatient hemodialysis unit has been arranged. -Discharge home today.  Patient needs to follow-up with outpatient dialysis unit as scheduled.  Gross hematuria with associated acute blood loss anemia: Resolved Hemorrhagic right kidney cyst on CT scan -Seen by urology on 06/15/2019.  Felt to have no need for urgent cystoscopy evaluation given improvement.  Patient poor candidate for embolization versus nephrectomy -Continue conservative management with Foley catheter and finasteride as recommended by Dr. Alyson Ingles.  Outpatient follow-up with urology.  History of DVT/PE on warfarin as an outpatient -Continue Coumadin as per pharmacy.  INR is currently therapeutic.    Discharged home on Coumadin 4 mg daily.  Outpatient follow-up of INR at earliest convenience and Coumadin to be dosed by PCP accordingly.  Diabetes mellitus type 2 uncontrolled with hyperglycemia -Continue carb modified diet.  Continue Lantus.  Anemia of chronic disease/acute blood loss anemia -Status post 5 units packed red cells during the hospitalization.  Currently hemoglobin stable.    Outpatient follow-up  Pancytopenia -Stable.  etiology unclear.  Monitor  Chronic hypoxic respiratory failure on 3 to 4 L oxygen via nasal cannula OSA OHS -Stable.  Continue oxygen supplementation at home on discharge.  Mood disorder/bipolar disorder -Stable.  Continue BuSpar, Cymbalta, Lamictal, Keppra.  Outpatient follow-up with psychiatry  Seizure disorder -Continue Keppra at a lower dose 500 mg twice daily and Lamictal  Chronic pain syndrome -Stable.  Continue fentanyl patch along with oxycodone.  Outpatient follow-up with pain management.  Morbid obesity -Outpatient follow-up  Debility, wheelchair-bound at baseline -Overall prognosis is guarded to poor.  Palliative care evaluation for goals of care is pending.  PT recommended CIR.  Patient refused CIR.  He  will need home health PT/OT  Left arm rash -Nonspecific.  Resolving with hydrocortisone cream  Mild confusion -Monitor mental status.  Currently stable.  -Patient is on multiple sedative medications.  Outpatient follow-up with PCP regarding need for adjusting these medications.  Discharge Instructions  Discharge Instructions    Diet - low sodium heart healthy   Complete by: As directed    Diet Carb Modified   Complete by: As directed    Increase activity slowly   Complete by: As directed      Allergies as of 07/03/2019      Reactions   Bee Venom Anaphylaxis, Shortness Of Breath, Swelling   Other Shortness Of Breath   Itching, rash with IVP DYE, iodine, shellfish LATEX   Shellfish Allergy Nausea And Vomiting, Other (See Comments)   Feels like insides are twisting   Iodinated Diagnostic Agents    Other reaction(s): RASH   Iohexol     Code: RASH, Desc: PT WAS ON PREDNISONE FOR GOUT TX. @ TIME OF SCAN AND RECEIVED 50 MG OF BENADRYL IV-ARS 10/08/07   Iodine Rash   Latex Rash      Medication List    STOP taking these medications   amoxicillin-clavulanate 500-125 MG tablet Commonly known as: Augmentin   calcitRIOL 0.25 MCG capsule Commonly known as: ROCALTROL   docusate sodium 100 MG capsule Commonly known as: COLACE   furosemide 80 MG tablet Commonly known as: LASIX   insulin lispro 100 UNIT/ML KwikPen Commonly known as: HUMALOG     TAKE these medications   albuterol 108 (90 Base) MCG/ACT inhaler Commonly known as: VENTOLIN HFA TAKE 2 PUFFS BY MOUTH EVERY 6 HOURS AS NEEDED FOR WHEEZE OR SHORTNESS OF BREATH What changed: See the new instructions.   ascorbic acid 500 MG tablet Commonly known as: VITAMIN C Take 1 tablet (500 mg total) by mouth daily.   busPIRone 15 MG tablet Commonly known as: BUSPAR TAKE 1 TABLET (15 MG TOTAL) BY MOUTH 2 (TWO) TIMES DAILY. What changed: See the new instructions.   calcium acetate 667 MG capsule Commonly known as:  PHOSLO Take 2 capsules (1,334 mg total) by mouth 3 (three) times daily with meals.   cetirizine 10 MG tablet Commonly known as: ZYRTEC Take 1 tablet (10 mg total) by mouth daily.   clomiPHENE 50 MG tablet Commonly known as: CLOMID Take 0.5 tablets (25 mg total) by mouth every other day.   DULoxetine 60 MG capsule Commonly known as: CYMBALTA TAKE 1 CAPSULE BY MOUTH EVERY DAY What changed: how much to take   Emgality 120 MG/ML Soaj Generic drug: Galcanezumab-gnlm Inject 120 mg as directed every 30 (thirty) days.   EpiPen 2-Pak 0.3 mg/0.3 mL Soaj injection Generic drug: EPINEPHrine INJECT 0.3 MLS (0.3 MG TOTAL) INTO THE MUSCLE ONCE. AS NEEDED FOR ANAPHYLACTIC REACTION What changed: See the new instructions.   esomeprazole 40 MG capsule Commonly known as: NEXIUM TAKE 1 CAPSULE (40 MG TOTAL) BY MOUTH DAILY AT 12 NOON.   fentaNYL 50 MCG/HR Commonly known as: [place] 1 patch onto the skin every 3 (three) days.   finasteride 5 MG tablet Commonly known as: PROSCAR Take 1 tablet (5 mg total) by mouth daily.   fluticasone 50 MCG/ACT nasal spray Commonly known as: FLONASE PLACE 1 SPRAY INTO BOTH NOSTRILS 2 (TWO) TIMES DAILY AS NEEDED FOR ALLERGIES.   HYDROcodone-acetaminophen 7.5-325 MG tablet Commonly known as: Norco Take 1 tablet by mouth every 6 (six) hours as needed for moderate pain.   lamoTRIgine 200 MG tablet Commonly known  as: LAMICTAL TAKE 1 TABLET BY MOUTH TWICE A DAY   levETIRAcetam 500 MG tablet Commonly known as: KEPPRA Take 1 tablet (500 mg total) by mouth 2 (two) times daily. What changed:   medication strength  how much to take   Narcan 4 MG/0.1ML Liqd nasal spray kit Generic drug: naloxone Place 1 spray into the nose once.   polyethylene glycol 17 g packet Commonly known as: MIRALAX / GLYCOLAX Take 17 g by mouth 2 (two) times daily.   pregabalin 50 MG capsule Commonly known as: LYRICA Take 1 capsule (50 mg total) by mouth 2 (two) times  daily. What changed: See the new instructions.   rizatriptan 10 MG tablet Commonly known as: MAXALT Take 1 tablet (10 mg total) by mouth as needed. May repeat in 2 hours if needed   senna 8.6 MG Tabs tablet Commonly known as: SENOKOT Take 1 tablet (8.6 mg total) by mouth at bedtime.   simvastatin 20 MG tablet Commonly known as: ZOCOR Take 1 tablet (20 mg total) by mouth at bedtime.   tamsulosin 0.4 MG Caps capsule Commonly known as: FLOMAX TAKE 1 CAPSULE BY MOUTH EVERY DAY What changed: additional instructions   Toujeo SoloStar 300 UNIT/ML Sopn Generic drug: Insulin Glargine (1 Unit Dial) Inject 45 Units into the skin 2 (two) times daily. What changed: how much to take   Trulicity 1.5 ZH/0.8MV Sopn Generic drug: Dulaglutide Inject 1.5 mg as directed once a week.   Vitamin D (Ergocalciferol) 1.25 MG (50000 UT) Caps capsule Commonly known as: DRISDOL Take 50,000 Units by mouth once a week.   warfarin 4 MG tablet Commonly known as: COUMADIN Take as directed. If you are unsure how to take this medication, talk to your nurse or doctor. Original instructions: Take 1 tablet (4 mg total) by mouth one time only at 6 PM. What changed:   medication strength  how much to take  when to take this  additional instructions   zinc sulfate 220 (50 Zn) MG capsule Take 1 capsule (220 mg total) by mouth 2 (two) times daily.            Durable Medical Equipment  (From admission, onward)         Start     Ordered   06/28/19 1043  For home use only DME oxygen  Once    Question Answer Comment  Length of Need Lifetime   Mode or (Route) Nasal cannula   Liters per Minute 4   Frequency Continuous (stationary and portable oxygen unit needed)   Oxygen delivery system Gas      06/28/19 1043   06/27/19 1733  For home use only DME Other see comment  Once    Comments: Bariatric slide board  Question:  Length of Need  Answer:  Lifetime   06/27/19 1733   06/27/19 1551  For home  use only DME Other see comment  Once    Comments: Bariatric hoyer lift  Question:  Length of Need  Answer:  Lifetime   06/27/19 1550         Follow-up Information    Angelia Mould, MD In 6 weeks.   Specialties: Vascular Surgery, Cardiology Contact information: [place] [address] [place] [facility] up.   Specialty: [place] Why: the office will call to schedule visits for physical therapy       Sharion Balloon, FNP. Schedule an appointment  as soon as possible for a visit in 1 week(s).   Specialty: Family Medicine Contact information: [place] [address] [phone]          Allergies  Allergen Reactions  . Bee Venom Anaphylaxis, Shortness Of Breath and Swelling  . Other Shortness Of Breath    Itching, rash with IVP DYE, iodine, shellfish LATEX  . Shellfish Allergy Nausea And Vomiting and Other (See Comments)    Feels like insides are twisting  . Iodinated Diagnostic Agents     Other reaction(s): RASH  . Iohexol      Code: RASH, Desc: PT WAS ON PREDNISONE FOR GOUT TX. @ TIME OF SCAN AND RECEIVED 50 MG OF BENADRYL IV-ARS 10/08/07   . Iodine Rash  . Latex Rash    Consultations: Nephrology/vascular surgery/IR/urology   Procedures/Studies: DG Knee 1-2 Views Right  Result Date: 06/12/2019 CLINICAL DATA:  Right knee trauma. EXAM: RIGHT KNEE - 1-2 VIEW COMPARISON:  None. FINDINGS: There is extensive prepatellar soft tissue swelling. There is a moderate-sized joint effusion. There are end-stage degenerative changes of the patellofemoral compartment. There are advanced degenerative changes of the medial and lateral compartments. Chondrocalcinosis is noted. There is no definite acute displaced fracture or dislocation. IMPRESSION: 1. Extensive prepatellar soft tissue swelling and moderate-sized joint effusion. 2. No definite acute displaced fracture or dislocation. If  there is high clinical suspicion for an occult fracture, follow-up with CT is recommended. 3. End-stage degenerative changes of the patellofemoral compartment. 4. Chondrocalcinosis. Electronically Signed   By: Constance Holster M.D.   On: 06/12/2019 19:09   CT Head Wo Contrast  Result Date: 06/11/2019 CLINICAL DATA:  Recent fall with headaches EXAM: CT HEAD WITHOUT CONTRAST CT CERVICAL SPINE WITHOUT CONTRAST TECHNIQUE: Multidetector CT imaging of the head and cervical spine was performed following the standard protocol without intravenous contrast. Multiplanar CT image reconstructions of the cervical spine were also generated. COMPARISON:  None. FINDINGS: CT HEAD FINDINGS Brain: No evidence of acute infarction, hemorrhage, hydrocephalus, extra-axial collection or mass lesion/mass effect. Vascular: No hyperdense vessel or unexpected calcification. Skull: Normal. Negative for fracture or focal lesion. Sinuses/Orbits: No acute finding. Other: None. CT CERVICAL SPINE FINDINGS Alignment: Alignment is within normal limits although severe artifact is noted below the C5 vertebral body. The posterior elements of C6 and C7 are well visualized. Skull base and vertebrae: 7 cervical segments are visualized although the vertebral body at C6 and C7 is incompletely evaluated due to significant artifact. No acute fracture or acute facet abnormality is noted. Soft tissues and spinal canal: Surrounding soft tissue structures are within normal limits. Upper chest: Mild interstitial changes are seen. Other: None IMPRESSION: CT of the head: No acute intracranial abnormality noted. CT of the cervical spine: Significantly limited exam secondary to the patient's body habitus and inability to adequately position himself. No acute abnormality is noted. Electronically Signed   By: Inez Catalina M.D.   On: 06/11/2019 16:34   CT Cervical Spine Wo Contrast  Result Date: 06/11/2019 CLINICAL DATA:  Recent fall with headaches EXAM: CT HEAD  WITHOUT CONTRAST CT CERVICAL SPINE WITHOUT CONTRAST TECHNIQUE: Multidetector CT imaging of the head and cervical spine was performed following the standard protocol without intravenous contrast. Multiplanar CT image reconstructions of the cervical spine were also generated. COMPARISON:  None. FINDINGS: CT HEAD FINDINGS Brain: No evidence of acute infarction, hemorrhage, hydrocephalus, extra-axial collection or mass lesion/mass effect. Vascular: No hyperdense vessel or unexpected calcification. Skull: Normal. Negative for fracture or focal  lesion. Sinuses/Orbits: No acute finding. Other: None. CT CERVICAL SPINE FINDINGS Alignment: Alignment is within normal limits although severe artifact is noted below the C5 vertebral body. The posterior elements of C6 and C7 are well visualized. Skull base and vertebrae: 7 cervical segments are visualized although the vertebral body at C6 and C7 is incompletely evaluated due to significant artifact. No acute fracture or acute facet abnormality is noted. Soft tissues and spinal canal: Surrounding soft tissue structures are within normal limits. Upper chest: Mild interstitial changes are seen. Other: None IMPRESSION: CT of the head: No acute intracranial abnormality noted. CT of the cervical spine: Significantly limited exam secondary to the patient's body habitus and inability to adequately position himself. No acute abnormality is noted. Electronically Signed   By: Inez Catalina M.D.   On: 06/11/2019 16:34   IR Fluoro Guide CV Line Right  Result Date: 06/21/2019 CLINICAL DATA:  Renal failure, needs durable venous access for hemodialysis until fistula matures EXAM: TUNNELED HEMODIALYSIS CATHETER PLACEMENT WITH ULTRASOUND AND FLUOROSCOPIC GUIDANCE TECHNIQUE: The procedure, risks, benefits, and alternatives were explained to the patient. Questions regarding the procedure were encouraged and answered. The patient understands and consents to the procedure. As antibiotic  prophylaxis, cefazolin 2 g was ordered pre-procedure and administered intravenously within one hour of incision.Patency of the right IJ vein was confirmed with ultrasound with image documentation. An appropriate skin site was determined. Region was prepped using maximum barrier technique including cap and mask, sterile gown, sterile gloves, large sterile sheet, and Chlorhexidine as cutaneous antisepsis. The region was infiltrated locally with 1% lidocaine. Intravenous Fentanyl 95mg and Versed 140mwere administered as conscious sedation during continuous monitoring of the patient's level of consciousness and physiological / cardiorespiratory status by the radiology RN, with a total moderate sedation time of 10 minutes. Under real-time ultrasound guidance, the right IJ vein was accessed with a 21 gauge micropuncture needle; the needle tip within the vein was confirmed with ultrasound image documentation. Needle exchanged over the 018 guidewire for transitional dilator, which allowed advancement of a Benson wire into the IVC. Over this, an MPA catheter was advanced. A Palindrome 23 hemodialysis catheter was tunneled from the right anterior chest wall approach to the right IJ dermatotomy site. The MPA catheter was exchanged over an Amplatz wire for serial vascular dilators which allow placement of a peel-away sheath, through which the catheter was advanced under intermittent fluoroscopy, positioned with its tips in the proximal and midright atrium. Spot chest radiograph confirms good catheter position. No pneumothorax. Catheter was flushed and primed per protocol. Catheter secured externally with O Prolene sutures. The right IJ dermatotomy site was closed with Dermabond. COMPLICATIONS: COMPLICATIONS None immediate FLUOROSCOPY TIME:  12 seconds; 11 mGy COMPARISON:  None IMPRESSION: 1. Technically successful placement of tunneled right IJ hemodialysis catheter with ultrasound and fluoroscopic guidance. Ready for routine  use. Electronically Signed   By: D Lucrezia Europe.D.   On: 06/21/2019 14:02   IR Fluoro Guide CV Line Right  Result Date: 06/14/2019 INDICATION: [id number] old male with acute renal failure in need of hemodialysis. He is currently anticoagulated and thrombocytopenic. Therefore, a temporary non tunneled hemodialysis catheter will be placed. EXAM: IR RIGHT FLUORO GUIDE CV LINE; IR ULTRASOUND GUIDANCE VASC ACCESS RIGHT MEDICATIONS: None ANESTHESIA/SEDATION: None FLUOROSCOPY TIME:  Fluoroscopy Time: 0 minutes 18 seconds (5.3 mGy). COMPLICATIONS: None immediate. PROCEDURE: Informed written consent was obtained from the patient after a thorough discussion of the procedural risks, benefits and alternatives. All questions were addressed. Maximal Sterile  Barrier Technique was utilized including caps, mask, sterile gowns, sterile gloves, sterile drape, hand hygiene and skin antiseptic. A timeout was performed prior to the initiation of the procedure. The right internal jugular vein was interrogated with ultrasound and found to be widely patent. An image was obtained and stored for the medical record. Local anesthesia was attained by infiltration with 1% lidocaine. A small dermatotomy was made. Under real-time sonographic guidance, the vessel was punctured with a 21 gauge micropuncture needle. Using standard technique, the initial micro needle was exchanged over a 0.018 micro wire for a transitional 4 [place] micro sheath. The micro sheath was then exchanged over a 0.035 wire for a fascial dilator and the soft tissue tract was dilated. A 20 cm triple-lumen non tunneled hemodialysis catheter was then advanced over the wire and position with the tip in the upper right atrium. The catheter flushes and aspirates easily. The catheter was flushed with heparinized saline and secured to the skin with 0 Prolene suture. A sterile bandage was applied. The catheter hubs were capped. IMPRESSION: Successful placement of a right IJ approach 20  cm triple-lumen non tunneled hemodialysis catheter. The catheter tip is in the upper right atrium and ready for immediate use. Electronically Signed   By: Jacqulynn Cadet M.D.   On: 06/14/2019 18:59   IR US Guide Vasc Access Right  Result Date: 06/21/2019 CLINICAL DATA:  Renal failure, needs durable venous access for hemodialysis until fistula matures EXAM: TUNNELED HEMODIALYSIS CATHETER PLACEMENT WITH ULTRASOUND AND FLUOROSCOPIC GUIDANCE TECHNIQUE: The procedure, risks, benefits, and alternatives were explained to the patient. Questions regarding the procedure were encouraged and answered. The patient understands and consents to the procedure. As antibiotic prophylaxis, cefazolin 2 g was ordered pre-procedure and administered intravenously within one hour of incision.Patency of the right IJ vein was confirmed with ultrasound with image documentation. An appropriate skin site was determined. Region was prepped using maximum barrier technique including cap and mask, sterile gown, sterile gloves, large sterile sheet, and Chlorhexidine as cutaneous antisepsis. The region was infiltrated locally with 1% lidocaine. Intravenous Fentanyl 75mg and Versed 172mwere administered as conscious sedation during continuous monitoring of the patient's level of consciousness and physiological / cardiorespiratory status by the radiology RN, with a total moderate sedation time of 10 minutes. Under real-time ultrasound guidance, the right IJ vein was accessed with a 21 gauge micropuncture needle; the needle tip within the vein was confirmed with ultrasound image documentation. Needle exchanged over the 018 guidewire for transitional dilator, which allowed advancement of a Benson wire into the IVC. Over this, an MPA catheter was advanced. A Palindrome 23 hemodialysis catheter was tunneled from the right anterior chest wall approach to the right IJ dermatotomy site. The MPA catheter was exchanged over an Amplatz wire for serial  vascular dilators which allow placement of a peel-away sheath, through which the catheter was advanced under intermittent fluoroscopy, positioned with its tips in the proximal and midright atrium. Spot chest radiograph confirms good catheter position. No pneumothorax. Catheter was flushed and primed per protocol. Catheter secured externally with O Prolene sutures. The right IJ dermatotomy site was closed with Dermabond. COMPLICATIONS: COMPLICATIONS None immediate FLUOROSCOPY TIME:  12 seconds; 11 mGy COMPARISON:  None IMPRESSION: 1. Technically successful placement of tunneled right IJ hemodialysis catheter with ultrasound and fluoroscopic guidance. Ready for routine use. Electronically Signed   By: D Lucrezia [place].D.   On: 06/21/2019 14:02   IR [place]ide Vasc Access Right  Result Date: 06/14/2019 INDICATION: [id number] old  male with acute renal failure in need of hemodialysis. He is currently anticoagulated and thrombocytopenic. Therefore, a temporary non tunneled hemodialysis catheter will be placed. EXAM: IR RIGHT FLUORO GUIDE CV LINE; IR ULTRASOUND GUIDANCE VASC ACCESS RIGHT MEDICATIONS: None ANESTHESIA/SEDATION: None FLUOROSCOPY TIME:  Fluoroscopy Time: 0 minutes 18 seconds (5.3 mGy). COMPLICATIONS: None immediate. PROCEDURE: Informed written consent was obtained from the patient after a thorough discussion of the procedural risks, benefits and alternatives. All questions were addressed. Maximal Sterile Barrier Technique was utilized including caps, mask, sterile gowns, sterile gloves, sterile drape, hand hygiene and skin antiseptic. A timeout was performed prior to the initiation of the procedure. The right internal jugular vein was interrogated with ultrasound and found to be widely patent. An image was obtained and stored for the medical record. Local anesthesia was attained by infiltration with 1% lidocaine. A small dermatotomy was made. Under real-time sonographic guidance, the vessel was punctured with a 21  gauge micropuncture needle. Using standard technique, the initial micro needle was exchanged over a 0.018 micro wire for a transitional 4 [place] micro sheath. The micro sheath was then exchanged over a 0.035 wire for a fascial dilator and the soft tissue tract was dilated. A 20 cm triple-lumen non tunneled hemodialysis catheter was then advanced over the wire and position with the tip in the upper right atrium. The catheter flushes and aspirates easily. The catheter was flushed with heparinized saline and secured to the skin with 0 Prolene suture. A sterile bandage was applied. The catheter hubs were capped. IMPRESSION: Successful placement of a right IJ approach 20 cm triple-lumen non tunneled hemodialysis catheter. The catheter tip is in the upper right atrium and ready for immediate use. Electronically Signed   By: Jacqulynn Cadet M.D.   On: 06/14/2019 18:59   CT Renal Stone Study  Result Date: 06/11/2019 CLINICAL DATA:  Fall yesterday, rt hip pain. [facility] Multiple UTI since then after foley removed. Here for inability to void, as well as burning upon urination. History of DM, Migraine, seizure, CKD, DVT, PE. Pt unable to hold arms up for abd images due to prior shoulder surgery. EXAM: CT ABDOMEN AND PELVIS WITHOUT CONTRAST TECHNIQUE: Multidetector CT imaging of the abdomen and pelvis was performed following the standard protocol without IV contrast. COMPARISON:  06/04/2019 FINDINGS: Lower chest: There mild hazy opacities at the lung bases as well as linear areas of presumed atelectasis or scarring, similar to the prior CT allowing artifact from overlying arms on the current exam. No new lung base abnormalities. Hepatobiliary: Liver demonstrates central volume loss and relative enlargement of the lateral segment left lobe, findings suggesting cirrhosis. No liver mass or focal lesion. Status post cholecystectomy. No bile duct dilation Pancreas: Significant fatty  replacement. No defined mass or inflammation. Spleen: Enlarged, 20 x 7.8 x 21 cm. This is stable from recent exam. No splenic mass or focal lesion. Adrenals/Urinary Tract: No adrenal mass. Small peripherally calcified cysts reflecting residual left kidney, unchanged. Right kidney shows mild dilation of the intrarenal collecting system. Collecting system contents demonstrate increased attenuation similar to prior CT. There is a mass in the lower pole with peripheral increased attenuation. Mass measures 4.7 x 3.4 x 3.2 cm. The mass corresponds to a complex cyst noted on ultrasound dated 05/11/2019. Findings are consistent hemorrhage into the cyst, which is communicating with the collecting system. No other evidence of mass. No stones. Right ureters normal in course and in caliber. No stones. Bladder is decompressed with  a Foley catheter Stomach/Bowel: Stomach is unremarkable. Small bowel and colon are normal in caliber. No wall thickening. No inflammation. Appendix not visualized. No evidence of appendicitis. Vascular/Lymphatic: Mild aortic atherosclerotic calcifications. Enlarged splenic and portal veins. Prominent to mildly enlarged lymph nodes adjacent to the portal vein, largest measuring 1.4 cm in short axis. No other enlarged lymph nodes. Reproductive: Unremarkable. Other: No ascites.  No abdominal wall hernia. Musculoskeletal: No fracture or acute finding. No osteoblastic or osteolytic lesions. IMPRESSION: 1. Hemorrhage into a right renal lower pole cysts, or dilated calyx, which is communicating with the right intrarenal collecting system. Right renal collecting system is mildly dilated. There is no ureteral dilation, however, no renal or ureteral stones. This appearance is similar to the recent prior CT. 2. Hazy opacities at the lung bases consistent residual COVID-19 infection, also stable from the recent prior abdomen and pelvis CT. 3. 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 4. Marked chronic atrophy of the left kidney with residual peripherally calcified cysts, stable. 5. Mild aortic atherosclerosis. 6. No fracture of the visualized skeletal structures, which includes the right proximal femur to the intertrochanteric region. Electronically Signed   By: Lajean Manes M.D.   On: 06/11/2019 16:59   CT Renal Stone Study  Result Date: 06/04/2019 CLINICAL DATA:  Flank pain. Recurrent urinary tract infections. EXAM: CT ABDOMEN AND PELVIS WITHOUT CONTRAST TECHNIQUE: Multidetector CT imaging of the abdomen and pelvis was performed following the standard protocol without IV contrast. COMPARISON:  12/28/2018 FINDINGS: Lower chest: Increased patchy areas airspace disease are seen in both lower lungs, right side greater than left. No evidence of pleural effusion. Hepatobiliary: No mass visualized on this unenhanced exam. Mild capsular nodularity and left and caudate lobe hypertrophy again seen, highly suspicious for cirrhosis. Prior cholecystectomy. No evidence of biliary obstruction. Pancreas: No mass or inflammatory process visualized on this unenhanced exam. Spleen: Moderate splenomegaly measuring 18-19 cm in length is stable and consistent with portal venous hypertension. Adrenals/Urinary tract: Chronic atrophy of the left kidney with several small peripherally calcified cyst is unchanged in appearance. Compensatory hypertrophy of the right kidney is again seen. Mild increase in hazy opacity is seen in the right perinephric region. Mild right pelvicaliectasis is seen containing high attenuation fluid suspicious for hemorrhage. No evidence of renal or ureteral calculi. Unremarkable unopacified urinary bladder. Stomach/Bowel: No evidence of obstruction, inflammatory process, or abnormal fluid collections. Vascular/Lymphatic: No pathologically enlarged lymph nodes identified. No evidence of abdominal aortic aneurysm. Aortic  atherosclerosis. Reproductive:  No mass or other significant abnormality. Other:  None. Musculoskeletal:  No suspicious bone lesions identified. IMPRESSION: 1. Increased mild right renal pelvicaliectasis and hazy perinephric opacity. No obstructing calculi identified. These findings are nonspecific, but pyelonephritis cannot be excluded. 2. Stable chronic left renal atrophy. 3. Stable findings of hepatic cirrhosis and portal venous hypertension. No evidence of ascites. 4. Increased patchy areas of airspace disease in both lower lungs, right side greater than left, suspicious for atypical infectious or inflammatory process. Aortic Atherosclerosis (ICD10-I70.0). Electronically Signed   By: Marlaine Hind M.D.   On: 06/04/2019 10:13   VAS [place] UPPER EXT VEIN MAPPING (PRE-OP AVF)  Result Date: 06/15/2019 UPPER EXTREMITY VEIN MAPPING  Indications: Pre-access. Limitations: patient body habitus, patient positioning, patient movement Comparison Study: No prior studies. Performing Technologist: Oliver Hum RVT  Examination Guidelines: A complete evaluation includes B-mode imaging, spectral Doppler, color Doppler, and power Doppler as needed of all accessible  portions of each vessel. Bilateral testing is considered an integral part of a complete examination. Limited examinations for reoccurring indications may be performed as noted. +-----------------+-------------+----------+---------+ Right Cephalic   Diameter (cm)Depth (cm)Findings  +-----------------+-------------+----------+---------+ Shoulder             0.54        1.41             +-----------------+-------------+----------+---------+ Prox upper arm       0.40        1.47   branching +-----------------+-------------+----------+---------+ Mid upper arm        0.48        0.99             +-----------------+-------------+----------+---------+ Dist upper arm       0.60        0.51              +-----------------+-------------+----------+---------+ Antecubital fossa    0.42        0.41   branching +-----------------+-------------+----------+---------+ Prox forearm         0.21        0.80             +-----------------+-------------+----------+---------+ Mid forearm          0.17        0.75             +-----------------+-------------+----------+---------+ Dist forearm         0.14        0.20             +-----------------+-------------+----------+---------+ +-----------------+-------------+----------+--------------+ Right Basilic    Diameter (cm)Depth (cm)   Findings    +-----------------+-------------+----------+--------------+ Shoulder                                not visualized +-----------------+-------------+----------+--------------+ Prox upper arm                          not visualized +-----------------+-------------+----------+--------------+ Mid upper arm                           not visualized +-----------------+-------------+----------+--------------+ Dist upper arm                          not visualized +-----------------+-------------+----------+--------------+ Antecubital fossa                       not visualized +-----------------+-------------+----------+--------------+ Prox forearm                            not visualized +-----------------+-------------+----------+--------------+ Mid forearm                             not visualized +-----------------+-------------+----------+--------------+ Distal forearm                          not visualized +-----------------+-------------+----------+--------------+ +-----------------+-------------+----------+---------+ Left Cephalic    Diameter (cm)Depth (cm)Findings  +-----------------+-------------+----------+---------+ Shoulder             0.68        3.10             +-----------------+-------------+----------+---------+ Prox upper arm       0.44  1.16             +-----------------+-------------+----------+---------+ Mid upper arm        0.41        0.84             +-----------------+-------------+----------+---------+ Dist upper arm       0.57        0.43             +-----------------+-------------+----------+---------+ Antecubital fossa    0.42        0.46   branching +-----------------+-------------+----------+---------+ Prox forearm         0.34        0.72             +-----------------+-------------+----------+---------+ Mid forearm          0.34        0.76   branching +-----------------+-------------+----------+---------+ Dist forearm         0.33        0.37             +-----------------+-------------+----------+---------+ *See table(s) above for measurements and observations.  Diagnosing physician: Servando Snare MD Electronically signed by Servando Snare MD on 06/15/2019 at 3:37:10 PM.    Final        Subjective: Patient seen and examined at bedside.  Poor historian.  He denies worsening shortness of breath, vomiting or fevers  Discharge Exam: Vitals:   07/03/19 1300 07/03/19 1330  BP: (!) 105/53 (!) 100/49  Pulse: 72 76  Resp:    Temp:    SpO2:      General exam: No distress.  Looks chronically ill and older than stated age.  Poor historian Respiratory system: Bilateral decreased breath sounds at bases with some scattered crackles  cardiovascular system: S1-S2 heard, rate controlled Gastrointestinal system: Abdomen is morbidly obese, nondistended, soft and nontender. Normal bowel sounds heard. Extremities: No cyanosis; bilateral lower extremity edema present    The results of significant diagnostics from this hospitalization (including imaging, microbiology, ancillary and laboratory) are listed below for reference.     Microbiology: No results found for this or any previous visit (from the past 240 hour(s)).   Labs: BNP (last 3 results) Recent Labs    04/18/19 0315 04/20/19 0215  04/24/19 0145  BNP 18.1 34.6 944.9*   Basic Metabolic Panel: Recent Labs  Lab 06/28/19 1212 06/30/19 0515 07/01/19 0425 07/02/19 0430 07/03/19 1203  NA 131* 134* 135 135 136  K 4.5 4.5 4.8 4.6 4.6  CL 97* 98 97* 99 99  CO2 28 26 28 29 27   GLUCOSE 183* 140* 165* 146* 175*  BUN 46* 44* 63* 36* 62*  CREATININE 4.92* 4.68* 5.73* 3.81* 5.02*  CALCIUM 8.9 8.8* 8.8* 8.5* 8.8*  PHOS 4.6  --   --   --  5.9*   Liver Function Tests: Recent Labs  Lab 06/28/19 1212 07/03/19 1203  ALBUMIN 2.4* 2.6*   No results for input(s): LIPASE, AMYLASE in the last 168 hours. No results for input(s): AMMONIA in the last 168 hours. CBC: Recent Labs  Lab 06/29/19 0607 06/30/19 0515 07/01/19 0425 07/02/19 0430 07/03/19 0609  WBC 3.3* 3.3* 3.1* 3.0* 2.9*  NEUTROABS  --  1.7 1.7  --   --   HGB 7.9* 7.9* 8.0* 8.1* 8.1*  HCT 25.8* 26.0* 26.0* 26.4* 26.4*  MCV 101.6* 102.4* 100.8* 100.4* 100.4*  PLT 88* 86* 75* 72* 67*   Cardiac Enzymes: No results for input(s): CKTOTAL, CKMB, CKMBINDEX, TROPONINI in the  last 168 hours. BNP: Invalid input(s): POCBNP CBG: Recent Labs  Lab 07/02/19 0707 07/02/19 1159 07/02/19 1618 07/02/19 2026 07/03/19 0653  GLUCAP 117* 156* 150* 196* 162*   D-Dimer No results for input(s): DDIMER in the last 72 hours. Hgb A1c No results for input(s): HGBA1C in the last 72 hours. Lipid Profile No results for input(s): CHOL, HDL, LDLCALC, TRIG, CHOLHDL, LDLDIRECT in the last 72 hours. Thyroid function studies No results for input(s): TSH, T4TOTAL, T3FREE, THYROIDAB in the last 72 hours.  Invalid input(s): FREET3 Anemia work up No results for input(s): VITAMINB12, FOLATE, FERRITIN, TIBC, IRON, RETICCTPCT in the last 72 hours. Urinalysis    Component Value Date/Time   COLORURINE AMBER (A) 06/28/2019 0244   APPEARANCEUR CLOUDY (A) 06/28/2019 0244   APPEARANCEUR Cloudy (A) 06/01/2019 0843   LABSPEC 1.016 06/28/2019 0244   PHURINE 5.0 06/28/2019 0244   GLUCOSEU  NEGATIVE 06/28/2019 0244   HGBUR LARGE (A) 06/28/2019 0244   BILIRUBINUR NEGATIVE 06/28/2019 0244   BILIRUBINUR Negative 06/01/2019 [place] 06/28/2019 0244   PROTEINUR 100 (A) 06/28/2019 0244   UROBILINOGEN negative 02/22/2015 1427   UROBILINOGEN 0.2 10/17/2010 0951   NITRITE NEGATIVE 06/28/2019 0244   LEUKOCYTESUR LARGE (A) 06/28/2019 0244   Sepsis Labs Invalid input(s): PROCALCITONIN,  WBC,  LACTICIDVEN Microbiology No results found for this or any previous visit (from the past 240 hour(s)).   Time coordinating discharge: 35 minutes  SIGNED:   Aline August, MD  [facility] 07/03/2019, 1:53 PM

## 2019-07-03 NOTE — Progress Notes (Signed)
RN noticed some dried blood on pt's finger. Pt stated that he had a minor nose bleed last night that stopped quickly. Pt states that he has nose bleeds a lot. RN informed pt that if he has another nose bleed to let staff know because he is on Coumadin and the dose may need to be adjusted. Pt stated that he understood.   Eleanora Neighbor, RN

## 2019-07-03 NOTE — Progress Notes (Signed)
   [facility] Progress Note    Assessment/ Plan:   1. New ESRD, s/p AVF LUE BC 12/21 and R IJ TDC 12/23; Start HD 12/17, CLIP'd to [facility]. Cont HD MWF schedule, HD today.  \ 2. Gross hematuria: s/p uro eval- no heparin with HD 3. Anemia: s/p Fe and on Darbe 150 q Friday; Hb 8.1 1/4, stable 4. AMS, improved 5. CKD-BMD: PHosLo, PTH ok, CTM 6. OSA on CPAP 7. Obesity 8. Hx/o PE/DVT, on warfarin 9. Debility, needs aggressive PT/OT and need to see if can successfully transfer from powerchair to HD -- will need Hoyer up to 600 lbs.  Possible D/C today after HD.   Subjective:    Going for HD today -- concerned sitting in chair for HD will make LE worse.  Otherwise no new issues.   Objective:   BP (!) 114/47 (BP Location: Right Wrist)   Pulse 69   Temp 98.4 F (36.9 C)   Resp 20   Ht 6\' 3"  (1.905 m)   Wt (!) 203.1 kg   SpO2 94%   BMI 55.97 kg/m   Intake/Output Summary (Last 24 hours) at 07/03/2019 1039 Last data filed at 07/03/2019 0446 Gross per 24 hour  Intake 480 ml  Output 200 ml  Net 280 ml   Weight change: 0.1 kg  Physical Exam: Gen: NAD, obese CVS: RRR Resp: on O2, nonlabored Abd: obese Ext: 2+ LE edema  Imaging: No results found.  Labs: BMET Recent Labs  Lab 06/28/19 1212 06/30/19 0515 07/01/19 0425 07/02/19 0430  NA 131* 134* 135 135  K 4.5 4.5 4.8 4.6  CL 97* 98 97* 99  CO2 28 26 28 29   GLUCOSE 183* 140* 165* 146*  BUN 46* 44* 63* 36*  CREATININE 4.92* 4.68* 5.73* 3.81*  CALCIUM 8.9 8.8* 8.8* 8.5*  PHOS 4.6  --   --   --    CBC Recent Labs  Lab 06/30/19 0515 07/01/19 0425 07/02/19 0430 07/03/19 0609  WBC 3.3* 3.1* 3.0* 2.9*  NEUTROABS 1.7 1.7  --   --   HGB 7.9* 8.0* 8.1* 8.1*  HCT 26.0* 26.0* 26.4* 26.4*  MCV 102.4* 100.8* 100.4* 100.4*  PLT 86* 75* 72* 67*    Medications:    . busPIRone  15 mg Oral BID  . calcium acetate  1,334 mg Oral TID WC  . Chlorhexidine Gluconate Cloth  6 each Topical Q0600  . [START ON  07/07/2019] darbepoetin (ARANESP) injection - DIALYSIS  150 mcg Intravenous Q Fri-HD  . DULoxetine  30 mg Oral Daily  . fentaNYL  1 patch Transdermal Q3 days  . finasteride  5 mg Oral Daily  . hydrocortisone cream   Topical BID  . insulin aspart  0-9 Units Subcutaneous TID WC  . insulin glargine  45 Units Subcutaneous BID  . lamoTRIgine  200 mg Oral BID  . levETIRAcetam  500 mg Oral BID  . polyethylene glycol  17 g Oral BID  . pregabalin  50 mg Oral BID  . senna  1 tablet Oral QHS  . simvastatin  20 mg Oral QHS  . tamsulosin  0.4 mg Oral Daily  . traZODone  50 mg Oral QHS  . warfarin  4 mg Oral ONCE-1800  . Warfarin - Pharmacist Dosing Inpatient   Does not apply [phone]

## 2019-07-03 NOTE — Progress Notes (Signed)
DISCHARGE NOTE HOME SAVIOUR MATHIAS to be discharged Home per MD order. Discussed prescriptions and follow up appointments with the patient. Prescriptions given to patient; medication list explained in detail. Patient verbalized understanding.  Skin clean, dry and intact without evidence of skin break down, no evidence of skin tears noted. IV catheter discontinued intact. Site without signs and symptoms of complications. Dressing and pressure applied. Pt denies pain at the site currently. No complaints noted.  Patient free of lines, drains, and wounds.   An After Visit Summary (AVS) was printed and given to the patient. Patient escorted via wheelchair, and discharged home via private auto.  Arlyss Repress, RN

## 2019-07-03 NOTE — Progress Notes (Signed)
ANTICOAGULATION CONSULT NOTE - Follow Up Consult  Pharmacy Consult for Warfarin Indication: h/o pulmonary embolus and DVT  Allergies  Allergen Reactions  . Bee Venom Anaphylaxis, Shortness Of Breath and Swelling  . Other Shortness Of Breath    Itching, rash with IVP DYE, iodine, shellfish LATEX  . Shellfish Allergy Nausea And Vomiting and Other (See Comments)    Feels like insides are twisting  . Iodinated Diagnostic Agents     Other reaction(s): RASH  . Iohexol      Code: RASH, Desc: PT WAS ON PREDNISONE FOR GOUT TX. @ TIME OF SCAN AND RECEIVED 50 MG OF BENADRYL IV-ARS 10/08/07   . Iodine Rash  . Latex Rash    Patient Measurements: Height: 6' 3"  (190.5 cm) Weight: (!) 447 lb 12.1 oz (203.1 kg) IBW/kg (Calculated) : 84.5  Vital Signs: Temp: 98.4 F (36.9 C) (01/04 0446) BP: 114/47 (01/04 0446) Pulse Rate: 69 (01/04 0446)  Labs: Recent Labs    07/01/19 0425 07/02/19 0430 07/03/19 0609  HGB 8.0* 8.1* 8.1*  HCT 26.0* 26.4* 26.4*  PLT 75* 72* 67*  LABPROT 28.6* 27.9* 30.5*  INR 2.7* 2.6* 2.9*  CREATININE 5.73* 3.81*  --     Estimated Creatinine Clearance: 42.3 mL/min (A) (by C-G formula based on SCr of 3.81 mg/dL (H)).   Medications:  Medications Prior to Admission  Medication Sig Dispense Refill Last Dose  . [EXPIRED] amoxicillin-clavulanate (AUGMENTIN) 500-125 MG tablet Take 1 tablet (500 mg total) by mouth 2 (two) times daily for 7 days. 14 tablet 0 06/11/2019 at Unknown time  . busPIRone (BUSPAR) 15 MG tablet TAKE 1 TABLET (15 MG TOTAL) BY MOUTH 2 (TWO) TIMES DAILY. (Patient taking differently: Take 15 mg by mouth 2 (two) times daily. TAKE 1 TABLET (15 MG TOTAL) BY MOUTH 2 (TWO) TIMES DAILY.) 180 tablet 0 06/11/2019 at Unknown time  . calcitRIOL (ROCALTROL) 0.25 MCG capsule Take 0.25 mcg by mouth daily.   Past Week at Unknown time  . cetirizine (ZYRTEC) 10 MG tablet Take 1 tablet (10 mg total) by mouth daily. 30 tablet 11 Past Week at Unknown time  . clomiPHENE  (CLOMID) 50 MG tablet Take 0.5 tablets (25 mg total) by mouth every other day. 24 tablet 3 Past Week at Unknown time  . docusate sodium (COLACE) 100 MG capsule Take 1 capsule (100 mg total) by mouth 2 (two) times daily. 10 capsule 0 Past Week at Unknown time  . DULoxetine (CYMBALTA) 60 MG capsule TAKE 1 CAPSULE BY MOUTH EVERY DAY (Patient taking differently: Take 60 mg by mouth daily. ) 90 capsule 0 Past Week at Unknown time  . EMGALITY 120 MG/ML SOAJ Inject 120 mg as directed every 30 (thirty) days.    Past Week at Unknown time  . esomeprazole (NEXIUM) 40 MG capsule TAKE 1 CAPSULE (40 MG TOTAL) BY MOUTH DAILY AT 12 NOON. 90 capsule 0 Past Week at Unknown time  . fentaNYL (DURAGESIC) 50 MCG/HR Place 1 patch onto the skin every 3 (three) days.   06/11/2019 at Unknown time  . finasteride (PROSCAR) 5 MG tablet Take 1 tablet (5 mg total) by mouth daily. 30 tablet 1 06/11/2019 at Unknown time  . fluticasone (FLONASE) 50 MCG/ACT nasal spray PLACE 1 SPRAY INTO BOTH NOSTRILS 2 (TWO) TIMES DAILY AS NEEDED FOR ALLERGIES. 48 g 4 Past Week at Unknown time  . Insulin Glargine, 1 Unit Dial, (TOUJEO SOLOSTAR) 300 UNIT/ML SOPN Inject 60 Units into the skin 2 (two) times daily. (Patient taking  differently: Inject 200 Units into the skin daily. ) 130 pen 3 06/11/2019 at Unknown time  . insulin lispro (HUMALOG) 100 UNIT/ML KwikPen Inject 0.25 mLs (25 Units total) into the skin 3 (three) times daily. (Patient taking differently: Inject 50 Units into the skin 3 (three) times daily. ) 45 mL 0 06/11/2019 at Unknown time  . lamoTRIgine (LAMICTAL) 200 MG tablet TAKE 1 TABLET BY MOUTH TWICE A DAY (Patient taking differently: Take 200 mg by mouth 2 (two) times daily. ) 180 tablet 0 06/11/2019 at Unknown time  . levETIRAcetam (KEPPRA) 1000 MG tablet Take 1 tablet (1,000 mg total) by mouth 2 (two) times daily. 180 tablet 3 06/10/2019 at Unknown time  . pregabalin (LYRICA) 50 MG capsule TAKE 1 CAPSULE BY MOUTH THREE TIMES A DAY  (Patient taking differently: Take 50 mg by mouth 3 (three) times daily. TAKE 1 CAPSULE BY MOUTH THREE TIMES A DAY) 270 capsule 1 06/11/2019 at Unknown time  . PROAIR HFA 108 (90 Base) MCG/ACT inhaler TAKE 2 PUFFS BY MOUTH EVERY 6 HOURS AS NEEDED FOR WHEEZE OR SHORTNESS OF BREATH (Patient taking differently: Inhale 2 puffs into the lungs every 6 (six) hours as needed for wheezing or shortness of breath. ) 18 g 0 06/11/2019 at Unknown time  . rizatriptan (MAXALT) 10 MG tablet May repeat in 2 hours if needed (Patient taking differently: Take 10 mg by mouth as needed. May repeat in 2 hours if needed) 10 tablet 11 Past Week at Unknown time  . simvastatin (ZOCOR) 20 MG tablet Take 1 tablet (20 mg total) by mouth at bedtime. 90 tablet 3 06/10/2019 at Unknown time  . tamsulosin (FLOMAX) 0.4 MG CAPS capsule TAKE 1 CAPSULE BY MOUTH EVERY DAY (Patient taking differently: Take 0.4 mg by mouth daily. TAKE 1 CAPSULE BY MOUTH EVERY DAY) 90 capsule 0 06/11/2019 at Unknown time  . TRULICITY 1.5 YS/0.6TK SOPN Inject 1.5 mg as directed once a week.    Past Week at Unknown time  . vitamin C (VITAMIN C) 500 MG tablet Take 1 tablet (500 mg total) by mouth daily.   06/11/2019 at Unknown time  . Vitamin D, Ergocalciferol, (DRISDOL) 1.25 MG (50000 UT) CAPS capsule Take 50,000 Units by mouth once a week.   06/11/2019 at Unknown time  . warfarin (COUMADIN) 5 MG tablet Take 1 tablet (5 mg total) by mouth daily. Take 58m daily 90 tablet 2 06/10/2019 at 1700  . zinc sulfate 220 (50 Zn) MG capsule Take 1 capsule (220 mg total) by mouth 2 (two) times daily.   06/11/2019 at Unknown time  . EPIPEN 2-PAK 0.3 MG/0.3ML SOAJ injection INJECT 0.3 MLS (0.3 MG TOTAL) INTO THE MUSCLE ONCE. AS NEEDED FOR ANAPHYLACTIC REACTION (Patient taking differently: Inject 0.3 mg into the muscle as needed. ) 2 Device 2   . furosemide (LASIX) 80 MG tablet Take 1 tablet (80 mg total) by mouth 2 (two) times daily. Hold until follow up with nephrology     .  HYDROcodone-acetaminophen (NORCO) 7.5-325 MG tablet Take 1 tablet by mouth every 6 (six) hours as needed for moderate pain. 90 tablet 0   . NARCAN 4 MG/0.1ML LIQD nasal spray kit Place 1 spray into the nose once.       Scheduled:  . busPIRone  15 mg Oral BID  . calcium acetate  1,334 mg Oral TID WC  . Chlorhexidine Gluconate Cloth  6 each Topical Q0600  . [START ON 07/07/2019] darbepoetin (ARANESP) injection - DIALYSIS  150 mcg  Intravenous Q Fri-HD  . DULoxetine  30 mg Oral Daily  . fentaNYL  1 patch Transdermal Q3 days  . finasteride  5 mg Oral Daily  . hydrocortisone cream   Topical BID  . insulin aspart  0-9 Units Subcutaneous TID WC  . insulin glargine  45 Units Subcutaneous BID  . lamoTRIgine  200 mg Oral BID  . levETIRAcetam  500 mg Oral BID  . polyethylene glycol  17 g Oral BID  . pregabalin  50 mg Oral BID  . senna  1 tablet Oral QHS  . simvastatin  20 mg Oral QHS  . tamsulosin  0.4 mg Oral Daily  . traZODone  50 mg Oral QHS  . Warfarin - Pharmacist Dosing Inpatient   Does not apply q1800    Assessment: 54 yr old male amitted on 06/10/19 with a PMH significant for multiple DVT/PE. Patient is on warfarin 5 mg daily prior to admission (LD pta taken 06/10/19). INR was therapeutic on admit date. Warfarin was held on admission due to gross hematuria. S/P 5 unit RBCs during this hospitalization.   Patient was on IV heparin from 12/21 to 12/29 and heparin was discontinued when INR was therapeutic on 12/28 after warfarin was resumed on 12/22. Today, INR is 2.9 and therapeutic. Hgb 8.1. Plt 67 which is approximately at the patient's baseline. The patient was noted to have a nosebleed overnight on 1/3 that has resolved. Will reduce the dose slightly today to keep within range.   Goal of Therapy:  INR 2-3 Monitor platelets by anticoagulation protocol: Yes   Plan:  - Warfarin 4 mg x 1 dose at 1800 today - Daily PT/INR, CBC q72h - Will continue to monitor for any signs/symptoms of  bleeding and will follow up with PT/INR in the a.m.    Thank you for allowing pharmacy to be a part of this patient's care.  Alycia Rossetti, PharmD, BCPS Clinical Pharmacist Clinical phone for 07/03/2019: [phone] 07/03/2019 9:18 AM   **Pharmacist phone directory can now be found on [email or web address] ([email or web address]).  Listed under [place].

## 2019-07-03 NOTE — Progress Notes (Signed)
Palliative note.  Chart reviewed.  Discussed with PMT in rounds.  Communicated with attending MD.  Patient's goals appear set.  Full code, full scope.  His intentions are for PT/OT/dialysis.  He will be best supported by outpatient Palliative follow up.  Order placed to consult [facility] for referral to outpatient Palliative Care follow up.  If patient acutely declines please re-consult the inpatient palliative care team to see him.  Thanks very much,  Florentina Jenny, PA-C Palliative Medicine Office:  [phone]  No charge note.

## 2019-07-03 NOTE — Progress Notes (Signed)
Renal Navigator spoke with RN regarding patient being lifted to bariatric chair for HD today. Navigator spoke with PT to request assistance for RN.  Renal Navigator heard patient yelling in room from RN station and went to room to try to de-escalate patient/situation, and offer support. Patient stated that he did not feel like the lift would hold him and he did not think that he was being heard. Navigator asked about his greatest concerns and the team intervened to ensure that his concerns were addressed. He was successfully transferred from bed to chair by hospital lift. Patient appeared satisfied.  Renal Navigator sent messages to SW clinic and to Director of Operations and then received patient's OP HD seat schedule. Patient has been accepted at [facility] on a TTS schedule with a seat time of 11:45am. He needs arrive at 11:00am on his first day of treatment. Navigator spoke with Nephrologist/Dr. Johnney Ou who states he can start in the clinic on Thursday, 07/06/19, since he is having HD in the hospital today.  Renal Navigator notified Attending/Dr. Starla Link and called patient's wife to inform. Patient's wife states she has gotten [employer] with lift and is prepared for patient to come home. Renal Navigator met with patient at bedside to inform also. Patient is now cleared for discharge from Renal standpoint.   Alphonzo Cruise, [place] Renal Navigator [phone]

## 2019-07-03 NOTE — Progress Notes (Signed)
Physical Therapy Treatment Patient Details Name: [name] MRN: [id number] DOB: 04/23/1966 Today's Date: 07/03/2019    History of Present Illness [name] is a 54 y.o. male with medical history of CKD stage IV, bipolar disorder, chronic respiratory failure on 3 L, diabetes mellitus type 2, DVT, OSA/OHS, pulmonary embolus, seizure disorder, thrombocytopenia, hyperlipidemia, chronic pain syndrome presenting with decreased urine output and fall. (Here one month ago for similar complaints. Course complicated due to recent hospitalization for acute on chronic respiratory failure secondary to COVID-19 PNA.    PT Comments    Pt was seen for transition to chair with nursing staff as was requested by his MD, and was able to succeed with many staff encouraging him to try.  Pt was assisted with hoyer lift pad, using cloth pads to protect his skin and with slow progression to avoid stressing him.  Chair was in a flat posture to get him onto it, and then lifted up back of chair to make sure pt was positioned properly first.  Has a struggle with legs on legrests due to limited DF and knee flexion.  Talked with him about these needs as further goals of therapy, and the plan with sliding board transfers.  Follow acutely for the progression of his mobility as tolerated.   Follow Up Recommendations  Supervision/Assistance - 24 hour;CIR     Equipment Recommendations  Other (comment)    Recommendations for Other Services       Precautions / Restrictions Precautions Precautions: Fall Precaution Comments: anxiety about transfers to chair Restrictions Weight Bearing Restrictions: No Other Position/Activity Restrictions: pt is limited for LE ROM which makes bari chair a struggle    Mobility  Bed Mobility Overal bed mobility: Needs Assistance Bed Mobility: Rolling Rolling: Min assist;+2 for physical assistance;+2 for safety/equipment         General bed mobility comments: pt is able to  assist mobility when cued for sequence to initiate  Transfers Overall transfer level: Needs assistance               General transfer comment: pt was reinstructed mult times and padded skin with cloth pads to ease his worry about the transfer from lift seat to bari recliner  Ambulation/Gait                 Stairs             Wheelchair Mobility    Modified Rankin (Stroke Patients Only)       Balance     Sitting balance-Leahy Scale: Fair                                      Cognition Arousal/Alertness: Awake/alert Behavior During Therapy: Anxious Overall Cognitive Status: Within Functional Limits for tasks assessed                                 General Comments: Self limiting and anxious, but discussed the plan mult times to get pt to relax      Exercises      General Comments General comments (skin integrity, edema, etc.): pt was seen with many of nursing staff members in attendance to plan and execute his transfer to bari chair as requested by MD for HD      Pertinent Vitals/Pain Pain Assessment: Faces Faces Pain Scale: Hurts  little more Pain Location: general stiffness esp BLE's Pain Descriptors / Indicators: Grimacing;Guarding Pain Intervention(s): Limited activity within patient's tolerance;Monitored during session;Repositioned;Other (comment)(pt was attended by many nursing staff members)    Home Living                      Prior Function            PT Goals (current goals can now be found in the care plan section) Acute Rehab PT Goals Patient Stated Goal: to return home Progress towards PT goals: Progressing toward goals    Frequency    Min 3X/week      PT Plan Current plan remains appropriate    Co-evaluation              AM-PAC PT "6 Clicks" Mobility   Outcome Measure  Help needed turning from your back to your side while in a flat bed without using bedrails?: A  Lot Help needed moving from lying on your back to sitting on the side of a flat bed without using bedrails?: A Lot Help needed moving to and from a bed to a chair (including a wheelchair)?: Total Help needed standing up from a chair using your arms (e.g., wheelchair or bedside chair)?: Total Help needed to walk in hospital room?: Total Help needed climbing 3-5 steps with a railing? : Total 6 Click Score: 8    End of Session Equipment Utilized During Treatment: Oxygen Activity Tolerance: Patient tolerated treatment well;Other (comment)(anxiety was managed with repetitive instructions) Patient left: in chair;with call bell/phone within reach;with nursing/sitter in room;Other (comment)(belt on chair for avoiding scooting down, and reclined seat) Nurse Communication: Need for lift equipment;Mobility status PT Visit Diagnosis: Muscle weakness (generalized) (M62.81);History of falling (Z91.81);Other abnormalities of gait and mobility (R26.89)     Time: 1030-1101 PT Time Calculation (min) (ACUTE ONLY): 31 min  Charges:  $Therapeutic Activity: 23-37 mins                    Ramond Dial 07/03/2019, 12:05 PM   Mee Hives, PT MS Acute Rehab Dept. Number: [place] [facility] [place]

## 2019-07-03 NOTE — Progress Notes (Signed)
Patient ID: [name], male   DOB: 1965/08/29, 54 y.o.   MRN: [id number]  PROGRESS NOTE    [name]  [id number] DOB: 11-Dec-1965 DOA: 06/11/2019 PCP: Sharion Balloon, FNP   Brief Narrative:  54 year old male with history of chronic kidney disease stage V, chronic hypoxic respiratory failure on 3 L nasal cannula oxygen at home, OSA, OHS, diabetes mellitus type 2, pulmonary embolism, DVT, Covid pneumonia from 04/09/2019-04/25/2019, hospitalization from 06/04/2019-06/08/2023 UTI with gross hematuria, complicated pyelonephritis, acute on chronic CKD presented with inability to void along with pain and was found to have creatinine of 5.42.  He was transferred to [facility] for hemodialysis.  During the hospitalization, he has been started on hemodialysis.  PT is recommending CIR placement but patient refuses it.  He is awaiting outpatient hemodialysis arrangement.  Assessment & Plan:    End-stage renal disease; Initially AKI superimposed on his chronic renal disease stage V -Status post AV fistula placement on 06/19/2019.  Right IJ tunneled dialysis catheter on 06/20/2019 -Started HD on 06/15/2019.  Nephrology following.  Inpatient dialysis as per nephrology schedule.  Outpatient hemodialysis unit is being arranged. -Avoid morphine, baclofen, sodium phosphate, magnesium citrate-based laxative  Gross hematuria with associated acute blood loss anemia: Resolved Hemorrhagic right kidney cyst on CT scan -Seen by urology on 06/15/2019.  Felt to have no need for urgent cystoscopy evaluation given improvement.  Patient poor candidate for embolization versus nephrectomy -Continue conservative management with Foley catheter and finasteride as recommended by Dr. Alyson Ingles.  Outpatient follow-up with urology.  History of DVT/PE on warfarin as an outpatient -Continue Coumadin as per pharmacy.  INR is currently therapeutic.    Diabetes mellitus type 2 uncontrolled with  hyperglycemia -Continue Lantus along with CBGs with SSI.  Anemia of chronic disease/acute blood loss anemia -Status post 5 units packed red cells during the hospitalization.  Currently hemoglobin stable.  Transfuse if hemoglobin is less than 7  Pancytopenia -Stable.  etiology unclear.  Monitor  Chronic hypoxic respiratory failure on 3 to 4 L oxygen via nasal cannula OSA OHS -Stable.  Continue oxygen supplementation  Mood disorder/bipolar disorder -Stable.  Continue BuSpar, Cymbalta, Lamictal, Keppra and trazodone  Seizure disorder -Continue Keppra and Lamictal  Chronic pain syndrome -Stable.  Continue fentanyl patch  Morbid obesity -Outpatient follow-up  Debility, wheelchair-bound at baseline -Overall prognosis is guarded to poor.  Palliative care evaluation for goals of care is pending.  PT recommended CIR.  Patient refused CIR  Left arm rash -Nonspecific.  Resolving with hydrocortisone cream  Mild confusion -Monitor mental status.  Currently stable.  If mental status worsens, might consider holding Lyrica, BuSpar and Cymbalta   DVT prophylaxis: Coumadin Code Status: Full Family Communication: Spoke to patient at bedside Disposition Plan: Refusing CIR.  Home with PT/OT once outpatient hemodialysis unit has been arranged  Consultants: Nephrology/vascular surgery/IR/urology  Procedures:  AV fistula placement on 06/19/2019.  Right IJ tunneled dialysis catheter on 06/20/2019 -Started HD on 06/15/2019  Antimicrobials:  Anti-infectives (From admission, onward)   Start     Dose/Rate Route Frequency Ordered Stop   06/21/19 1257  ceFAZolin (ANCEF) 2-4 GM/100ML-% IVPB    Note to Pharmacy: Rudene Re   : cabinet override      06/21/19 1257 06/21/19 1611   06/21/19 0000  ceFAZolin (ANCEF) IVPB 2g/100 mL premix     2 g 200 mL/hr over 30 Minutes Intravenous To Radiology 06/20/19 1348 06/21/19 1337   06/19/19 0600  cefUROXime (ZINACEF) 1.5 g in  sodium chloride 0.9 % 100  mL IVPB     1.5 g 200 mL/hr over 30 Minutes Intravenous On call to O.R. 06/18/19 0959 06/19/19 1145   06/11/19 2200  amoxicillin-clavulanate (AUGMENTIN) 500-125 MG per tablet 500 mg  Status:  Discontinued     1 tablet Oral 2 times daily 06/11/19 2114 06/14/19 0135       Subjective: Patient seen and examined at bedside.  Poor historian.  He denies worsening shortness of breath, vomiting or fevers.   Objective: Vitals:   07/02/19 0900 07/02/19 1800 07/02/19 2026 07/03/19 0446  BP: (!) 116/56 114/66 (!) 124/54 (!) 114/47  Pulse: 72  70 69  Resp: 16 16 20 20   Temp: 98.6 F (37 C) 98.6 F (37 C) 98.2 F (36.8 C) 98.4 F (36.9 C)  TempSrc: Oral Oral    SpO2: 93% 94% 95% 94%  Weight:   (!) 203.1 kg   Height:        Intake/Output Summary (Last 24 hours) at 07/03/2019 0753 Last data filed at 07/03/2019 0446 Gross per 24 hour  Intake 600 ml  Output 200 ml  Net 400 ml   Filed Weights   06/28/19 2105 07/01/19 0918 07/02/19 2026  Weight: (!) 193.2 kg (!) 203 kg (!) 203.1 kg    Examination:  General exam: No distress.  Looks chronically ill and older than stated age.  Poor historian Respiratory system: Bilateral decreased breath sounds at bases with some scattered crackles  cardiovascular system: S1-S2 heard, rate controlled Gastrointestinal system: Abdomen is morbidly obese, nondistended, soft and nontender. Normal bowel sounds heard. Extremities: No cyanosis; bilateral lower extremity edema present    Data Reviewed: I have personally reviewed following labs and imaging studies  CBC: Recent Labs  Lab 06/29/19 0607 06/30/19 0515 07/01/19 0425 07/02/19 0430 07/03/19 0609  WBC 3.3* 3.3* 3.1* 3.0* 2.9*  NEUTROABS  --  1.7 1.7  --   --   HGB 7.9* 7.9* 8.0* 8.1* 8.1*  HCT 25.8* 26.0* 26.0* 26.4* 26.4*  MCV 101.6* 102.4* 100.8* 100.4* 100.4*  PLT 88* 86* 75* 72* 67*   Basic Metabolic Panel: Recent Labs  Lab 06/28/19 1212 06/30/19 0515 07/01/19 0425 07/02/19 0430  NA  131* 134* 135 135  K 4.5 4.5 4.8 4.6  CL 97* 98 97* 99  CO2 28 26 28 29   GLUCOSE 183* 140* 165* 146*  BUN 46* 44* 63* 36*  CREATININE 4.92* 4.68* 5.73* 3.81*  CALCIUM 8.9 8.8* 8.8* 8.5*  PHOS 4.6  --   --   --    GFR: Estimated Creatinine Clearance: 42.3 mL/min (A) (by C-G formula based on SCr of 3.81 mg/dL (H)). Liver Function Tests: Recent Labs  Lab 06/28/19 1212  ALBUMIN 2.4*   No results for input(s): LIPASE, AMYLASE in the last 168 hours. No results for input(s): AMMONIA in the last 168 hours. Coagulation Profile: Recent Labs  Lab 06/29/19 0607 06/30/19 0515 07/01/19 0425 07/02/19 0430 07/03/19 0609  INR 2.7* 2.5* 2.7* 2.6* 2.9*   Cardiac Enzymes: No results for input(s): CKTOTAL, CKMB, CKMBINDEX, TROPONINI in the last 168 hours. BNP (last 3 results) No results for input(s): PROBNP in the last 8760 hours. HbA1C: No results for input(s): HGBA1C in the last 72 hours. CBG: Recent Labs  Lab 07/02/19 0707 07/02/19 1159 07/02/19 1618 07/02/19 2026 07/03/19 0653  GLUCAP 117* 156* 150* 196* 162*   Lipid Profile: No results for input(s): CHOL, HDL, LDLCALC, TRIG, CHOLHDL, LDLDIRECT in the last 72 hours. Thyroid Function  Tests: No results for input(s): TSH, T4TOTAL, FREET4, T3FREE, THYROIDAB in the last 72 hours. Anemia Panel: No results for input(s): VITAMINB12, FOLATE, FERRITIN, TIBC, IRON, RETICCTPCT in the last 72 hours. Sepsis Labs: No results for input(s): PROCALCITON, LATICACIDVEN in the last 168 hours.  No results found for this or any previous visit (from the past 240 hour(s)).       Radiology Studies: No results found.      Scheduled Meds: . busPIRone  15 mg Oral BID  . calcium acetate  1,334 mg Oral TID WC  . Chlorhexidine Gluconate Cloth  6 each Topical Q0600  . [START ON 07/07/2019] darbepoetin (ARANESP) injection - DIALYSIS  150 mcg Intravenous Q Fri-HD  . DULoxetine  30 mg Oral Daily  . fentaNYL  1 patch Transdermal Q3 days  .  finasteride  5 mg Oral Daily  . hydrocortisone cream   Topical BID  . insulin aspart  0-9 Units Subcutaneous TID WC  . insulin glargine  45 Units Subcutaneous BID  . lamoTRIgine  200 mg Oral BID  . levETIRAcetam  500 mg Oral BID  . polyethylene glycol  17 g Oral BID  . pregabalin  50 mg Oral BID  . senna  1 tablet Oral QHS  . simvastatin  20 mg Oral QHS  . tamsulosin  0.4 mg Oral Daily  . traZODone  50 mg Oral QHS  . Warfarin - Pharmacist Dosing Inpatient   Does not apply q1800   Continuous Infusions: . sodium chloride 250 mL (06/15/19 1130)  . sodium chloride            Aline August, MD [facility] 07/03/2019, 7:53 AM

## 2019-07-03 NOTE — Progress Notes (Signed)
Hydrologist [facility])  Hospital Liaison: RN note       Notified by [facility] manager of patient/family request for [facility] Palliative services at home after discharge.       Writer spoke with patient's wife to confirm interest and explain services.             [place] Palliative team will follow up with patient after discharge.       Please call with any hospice or palliative related questions.       Thank you for this referral.       Farrel Gordon, RN, CCM   [facility] (listed on [facility] under Hospice and [place] [facility] [place])   [phone]

## 2019-07-03 NOTE — TOC Transition Note (Addendum)
Transition of Care [facility]) - CM/SW Discharge Note   Patient Details  Name: [name] MRN: [id number] Date of Birth: 18-Jul-1965  [facility]) CM/SW Contact:  Bartholomew Crews, RN Phone Number: [phone] 07/03/2019, 2:36 PM   Clinical Narrative:     Update: Patient is back from hemodialysis. PTAR arranged for 6p. Transport paperwork at nurses station.   Patient to transition home today after hemodialysis. Notified [facility] - orders received for PT and OT. Referral placed to palliative care at [facility] per [facility] consult placed by palliative nurse. Spoke with patient's spouse, [name] - all DME has been delivered and she has wheelchair [name] to transport patient to dialysis. Patient has been assigned a TTS schedule at [facility]. Patient needs to be at dialysis on Thursday at 11a to do admission paperwork. No further transition needs identified.    Final next level of care: [place] Barriers to Discharge: No Barriers Identified   Patient Goals and CMS Choice Patient states their goals for this hospitalization and ongoing recovery are:: return home CMS Medicare.gov Compare Post Acute Care list provided to:: Patient Choice offered to / list presented to : Patient, Spouse  Discharge Placement                  Name of family member notified: Tesfa Obriant Patient and family notified of of transfer: 07/03/19  Discharge Plan and Services   Discharge Planning Services: CM Consult Post Acute Care Choice: Home Health, Resumption of Svcs/PTA Provider          DME Arranged: N/A DME Agency: NA       HH Arranged: PT, OT [place] Agency: [place] ([facility]) Date HH Agency Contacted: 07/03/19 Time [place]: [id number] Representative spoke with at [place]: [place] ([place]) Interventions     Readmission Risk Interventions Readmission Risk Prevention Plan 06/12/2019 05/10/2019  Transportation  Screening Complete Complete  Medication Review Press photographer) Complete Complete  PCP or Specialist appointment within 3-5 days of discharge Complete -  [place] or [place] Complete -  SW Recovery Care/Counseling Consult Complete -  Palliative Care Screening Not Applicable Not [place] Not Applicable Not Applicable  Some recent data might be hidden

## 2019-07-04 DIAGNOSIS — E1122 Type 2 diabetes mellitus with diabetic chronic kidney disease: Secondary | ICD-10-CM | POA: Diagnosis not present

## 2019-07-04 DIAGNOSIS — N186 End stage renal disease: Secondary | ICD-10-CM | POA: Diagnosis not present

## 2019-07-04 DIAGNOSIS — N179 Acute kidney failure, unspecified: Secondary | ICD-10-CM | POA: Diagnosis not present

## 2019-07-04 DIAGNOSIS — I120 Hypertensive chronic kidney disease with stage 5 chronic kidney disease or end stage renal disease: Secondary | ICD-10-CM | POA: Diagnosis not present

## 2019-07-04 DIAGNOSIS — N390 Urinary tract infection, site not specified: Secondary | ICD-10-CM | POA: Diagnosis not present

## 2019-07-04 DIAGNOSIS — D631 Anemia in chronic kidney disease: Secondary | ICD-10-CM | POA: Diagnosis not present

## 2019-07-04 DIAGNOSIS — G9341 Metabolic encephalopathy: Secondary | ICD-10-CM | POA: Diagnosis not present

## 2019-07-04 DIAGNOSIS — E1165 Type 2 diabetes mellitus with hyperglycemia: Secondary | ICD-10-CM | POA: Diagnosis not present

## 2019-07-04 DIAGNOSIS — G4733 Obstructive sleep apnea (adult) (pediatric): Secondary | ICD-10-CM | POA: Diagnosis not present

## 2019-07-04 DIAGNOSIS — G894 Chronic pain syndrome: Secondary | ICD-10-CM | POA: Diagnosis not present

## 2019-07-04 DIAGNOSIS — J9611 Chronic respiratory failure with hypoxia: Secondary | ICD-10-CM | POA: Diagnosis not present

## 2019-07-05 DIAGNOSIS — G9341 Metabolic encephalopathy: Secondary | ICD-10-CM | POA: Diagnosis not present

## 2019-07-05 DIAGNOSIS — E1165 Type 2 diabetes mellitus with hyperglycemia: Secondary | ICD-10-CM | POA: Diagnosis not present

## 2019-07-05 DIAGNOSIS — D631 Anemia in chronic kidney disease: Secondary | ICD-10-CM | POA: Diagnosis not present

## 2019-07-05 DIAGNOSIS — N179 Acute kidney failure, unspecified: Secondary | ICD-10-CM | POA: Diagnosis not present

## 2019-07-05 DIAGNOSIS — N390 Urinary tract infection, site not specified: Secondary | ICD-10-CM | POA: Diagnosis not present

## 2019-07-05 DIAGNOSIS — N186 End stage renal disease: Secondary | ICD-10-CM | POA: Diagnosis not present

## 2019-07-05 DIAGNOSIS — E1122 Type 2 diabetes mellitus with diabetic chronic kidney disease: Secondary | ICD-10-CM | POA: Diagnosis not present

## 2019-07-05 DIAGNOSIS — J9611 Chronic respiratory failure with hypoxia: Secondary | ICD-10-CM | POA: Diagnosis not present

## 2019-07-05 DIAGNOSIS — G894 Chronic pain syndrome: Secondary | ICD-10-CM | POA: Diagnosis not present

## 2019-07-05 DIAGNOSIS — G4733 Obstructive sleep apnea (adult) (pediatric): Secondary | ICD-10-CM | POA: Diagnosis not present

## 2019-07-05 DIAGNOSIS — I120 Hypertensive chronic kidney disease with stage 5 chronic kidney disease or end stage renal disease: Secondary | ICD-10-CM | POA: Diagnosis not present

## 2019-07-06 ENCOUNTER — Other Ambulatory Visit: Payer: Self-pay | Admitting: Family

## 2019-07-06 DIAGNOSIS — D631 Anemia in chronic kidney disease: Secondary | ICD-10-CM | POA: Diagnosis not present

## 2019-07-06 DIAGNOSIS — D689 Coagulation defect, unspecified: Secondary | ICD-10-CM | POA: Diagnosis not present

## 2019-07-06 DIAGNOSIS — Z992 Dependence on renal dialysis: Secondary | ICD-10-CM | POA: Diagnosis not present

## 2019-07-06 DIAGNOSIS — N2581 Secondary hyperparathyroidism of renal origin: Secondary | ICD-10-CM | POA: Diagnosis not present

## 2019-07-06 DIAGNOSIS — E1129 Type 2 diabetes mellitus with other diabetic kidney complication: Secondary | ICD-10-CM | POA: Diagnosis not present

## 2019-07-06 DIAGNOSIS — N186 End stage renal disease: Secondary | ICD-10-CM | POA: Diagnosis not present

## 2019-07-07 DIAGNOSIS — I120 Hypertensive chronic kidney disease with stage 5 chronic kidney disease or end stage renal disease: Secondary | ICD-10-CM | POA: Diagnosis not present

## 2019-07-07 DIAGNOSIS — G894 Chronic pain syndrome: Secondary | ICD-10-CM | POA: Diagnosis not present

## 2019-07-07 DIAGNOSIS — D631 Anemia in chronic kidney disease: Secondary | ICD-10-CM | POA: Diagnosis not present

## 2019-07-07 DIAGNOSIS — G4733 Obstructive sleep apnea (adult) (pediatric): Secondary | ICD-10-CM | POA: Diagnosis not present

## 2019-07-07 DIAGNOSIS — N390 Urinary tract infection, site not specified: Secondary | ICD-10-CM | POA: Diagnosis not present

## 2019-07-07 DIAGNOSIS — N186 End stage renal disease: Secondary | ICD-10-CM | POA: Diagnosis not present

## 2019-07-07 DIAGNOSIS — J9611 Chronic respiratory failure with hypoxia: Secondary | ICD-10-CM | POA: Diagnosis not present

## 2019-07-07 DIAGNOSIS — G9341 Metabolic encephalopathy: Secondary | ICD-10-CM | POA: Diagnosis not present

## 2019-07-07 DIAGNOSIS — E1122 Type 2 diabetes mellitus with diabetic chronic kidney disease: Secondary | ICD-10-CM | POA: Diagnosis not present

## 2019-07-07 DIAGNOSIS — E1165 Type 2 diabetes mellitus with hyperglycemia: Secondary | ICD-10-CM | POA: Diagnosis not present

## 2019-07-07 DIAGNOSIS — N179 Acute kidney failure, unspecified: Secondary | ICD-10-CM | POA: Diagnosis not present

## 2019-07-10 ENCOUNTER — Other Ambulatory Visit: Payer: Self-pay

## 2019-07-10 ENCOUNTER — Other Ambulatory Visit: Payer: Self-pay | Admitting: Internal Medicine

## 2019-07-10 ENCOUNTER — Ambulatory Visit (INDEPENDENT_AMBULATORY_CARE_PROVIDER_SITE_OTHER): Payer: Medicare Other | Admitting: *Deleted

## 2019-07-10 DIAGNOSIS — E1165 Type 2 diabetes mellitus with hyperglycemia: Secondary | ICD-10-CM

## 2019-07-10 DIAGNOSIS — Z86711 Personal history of pulmonary embolism: Secondary | ICD-10-CM

## 2019-07-10 DIAGNOSIS — N184 Chronic kidney disease, stage 4 (severe): Secondary | ICD-10-CM | POA: Diagnosis not present

## 2019-07-10 DIAGNOSIS — G894 Chronic pain syndrome: Secondary | ICD-10-CM

## 2019-07-10 DIAGNOSIS — E1022 Type 1 diabetes mellitus with diabetic chronic kidney disease: Secondary | ICD-10-CM | POA: Diagnosis not present

## 2019-07-10 DIAGNOSIS — Z794 Long term (current) use of insulin: Secondary | ICD-10-CM | POA: Diagnosis not present

## 2019-07-10 DIAGNOSIS — Z7901 Long term (current) use of anticoagulants: Secondary | ICD-10-CM

## 2019-07-10 NOTE — Patient Instructions (Signed)
Visit Information  Goals Addressed            This Visit's Progress   . Chronic Disease Management Needs       Current Barriers:  . Chronic Disease Management support, education, and care coordination needs related to CKD (dialsysis), DM, Hyperlipidemia, arthritis, chronic pain, chronic anticoagulation, chronic respiratory failure  Clinical Goal(s) related to CKD (dialsysis), DM, Hyperlipidemia, arthritis, chronic pain, chronic anticoagulation, chronic respiratory failure:  Over the next 60 days, patient will:  . Work with the care management team to address educational, disease management, and care coordination needs  . Begin or continue self health monitoring activities as directed today Measure and record cbg (blood glucose) 3 times daily . Call provider office for new or worsened signs and symptoms Blood glucose findings outside established parameters . Call care management team with questions or concerns . Verbalize basic understanding of patient centered plan of care established today  Interventions related to CKD (dialsysis), DM, Hyperlipidemia, arthritis, chronic pain, chronic anticoagulation, chronic respiratory failure:  . Evaluation of current treatment plans and patient's adherence to plan as established by provider . Assessed patient understanding of disease states . Assessed patient's education and care coordination needs . Provided disease specific education to patient  . Collaborated with appropriate clinical care team members regarding patient needs  Patient Self Care Activities related to CKD (dialsysis), DM, Hyperlipidemia, arthritis, chronic pain, chronic anticoagulation, chronic respiratory failure:  . Patient is unable to independently self-manage chronic health conditions  Initial goal documentation     . Warfarin Management       Current Barriers:  Marland Kitchen Knowledge Deficits related to warfarin management . Chronic Disease Management support and education needs  related to chronic anticoagulation  Nurse Case Manager Clinical Goal(s):  Marland Kitchen Over the next 15 days, patient will have home INR testing setup through [facility]  Interventions:  . Evaluation of current treatment plan related to chronic anticoagulation and patient's adherence to plan as established by provider. . Advised patient to call me at [phone] if they have not been contacted about home INR testing within the next week . Reviewed medications with patient and discussed warfarin . Discussed plans with patient for ongoing care management follow up and provided patient with direct contact information for care management team . RN will collaborate with PCP office to arrange home INR testing through [facility]  Patient Self Care Activities:  . Patient verbalizes understanding of plan to arrange home INR testing . Self administers medications as prescribed . Calls provider office for new concerns or questions . Unable to independently walk or drive and perform some ADLs  Initial goal documentation         The care management team will reach out to the patient again over the next 15 days.   [name] was given information about Chronic Care Management services today including:  1. CCM service includes personalized support from designated clinical staff supervised by his physician, including individualized plan of care and coordination with other care providers 2. 24/7 contact phone numbers for assistance for urgent and routine care needs. 3. Service will only be billed when office clinical staff spend 20 minutes or more in a month to coordinate care. 4. Only one practitioner may furnish and bill the service in a calendar month. 5. The patient may stop CCM services at any time (effective at the end of the month) by phone call to the office staff. 6. The patient will be responsible for cost  sharing (co-pay) of up to 20% of the service fee (after annual deductible is  met).  Patient agreed to services and verbal consent obtained.   Chong Sicilian, BSN, RN-BC Embedded Chronic Care Manager [facility] [place] Management Direct Dial: [phone]   The patient verbalized understanding of instructions provided today and declined a print copy of patient instruction materials.

## 2019-07-10 NOTE — Chronic Care Management (AMB) (Addendum)
Chronic Care Management   Initial Visit Note  07/10/2019 Name: [name] MRN: [id number] DOB: 11-25-1965  Referred by: Sharion Balloon, FNP Reason for referral : Chronic Care Management (RN Outreach)   [name] is a 54 y.o. year old male who is a primary care patient of Sharion Balloon, FNP. The CCM team was consulted for assistance with chronic disease management and care coordination needs related to CKD (dialsysis), DM, Hyperlipidemia, arthritis, chronic pain, chronic anticoagulation, chronic respiratory failure   Review of patient status, including review of consultants reports, relevant laboratory and other test results, and collaboration with appropriate care team members and the patient's provider was performed as part of comprehensive patient evaluation and provision of chronic care management services.    SDOH (Social Determinants of Health) screening performed today: Social Connections Physical Activity. See Care Plan for related entries.   Subjective: I spoke with [name]'s wife, [name], by telephone today. [name] was present during the telephone call. He is receiving home health PT and OT and dialysis. [name] asked if there is a way to more easily monitor his warfarin without coming into the office. Discussed having it drawn at HD center with monthly labs vs having home INR testing setup.   Objective: Outpatient Encounter Medications as of 07/10/2019  Medication Sig Note   albuterol (VENTOLIN HFA) 108 (90 Base) MCG/ACT inhaler TAKE 2 PUFFS BY MOUTH EVERY 6 HOURS AS NEEDED FOR WHEEZE OR SHORTNESS OF BREATH    busPIRone (BUSPAR) 15 MG tablet TAKE 1 TABLET (15 MG TOTAL) BY MOUTH 2 (TWO) TIMES DAILY. (Patient taking differently: Take 15 mg by mouth 2 (two) times daily. TAKE 1 TABLET (15 MG TOTAL) BY MOUTH 2 (TWO) TIMES DAILY.)    calcium acetate (PHOSLO) 667 MG capsule Take 2 capsules (1,334 mg total) by mouth 3 (three) times daily with meals.    cetirizine  (ZYRTEC) 10 MG tablet Take 1 tablet (10 mg total) by mouth daily.    clomiPHENE (CLOMID) 50 MG tablet Take 0.5 tablets (25 mg total) by mouth every other day.    DULoxetine (CYMBALTA) 60 MG capsule TAKE 1 CAPSULE BY MOUTH EVERY DAY (Patient taking differently: Take 60 mg by mouth daily. )    EMGALITY 120 MG/ML SOAJ Inject 120 mg as directed every 30 (thirty) days.  04/09/2019: Around the first of the month   EPIPEN 2-PAK 0.3 MG/0.3ML SOAJ injection INJECT 0.3 MLS (0.3 MG TOTAL) INTO THE MUSCLE ONCE. AS NEEDED FOR ANAPHYLACTIC REACTION (Patient taking differently: Inject 0.3 mg into the muscle as needed. )    esomeprazole (NEXIUM) 40 MG capsule TAKE 1 CAPSULE (40 MG TOTAL) BY MOUTH DAILY AT 12 NOON.    fentaNYL (DURAGESIC) 50 MCG/HR Place 1 patch onto the skin every 3 (three) days.    finasteride (PROSCAR) 5 MG tablet Take 1 tablet (5 mg total) by mouth daily.    fluticasone (FLONASE) 50 MCG/ACT nasal spray PLACE 1 SPRAY INTO BOTH NOSTRILS 2 (TWO) TIMES DAILY AS NEEDED FOR ALLERGIES.    HYDROcodone-acetaminophen (NORCO) 7.5-325 MG tablet Take 1 tablet by mouth every 6 (six) hours as needed for moderate pain.    Insulin Glargine, 1 Unit Dial, (TOUJEO SOLOSTAR) 300 UNIT/ML SOPN Inject 45 Units into the skin 2 (two) times daily.    lamoTRIgine (LAMICTAL) 200 MG tablet TAKE 1 TABLET BY MOUTH TWICE A DAY (Patient taking differently: Take 200 mg by mouth 2 (two) times daily. )    levETIRAcetam (KEPPRA) 500 MG tablet Take  1 tablet (500 mg total) by mouth 2 (two) times daily.    NARCAN 4 MG/0.1ML LIQD nasal spray kit Place 1 spray into the nose once.     polyethylene glycol (MIRALAX / GLYCOLAX) 17 g packet Take 17 g by mouth 2 (two) times daily.    pregabalin (LYRICA) 50 MG capsule Take 1 capsule (50 mg total) by mouth 2 (two) times daily.    rizatriptan (MAXALT) 10 MG tablet Take 1 tablet (10 mg total) by mouth as needed. May repeat in 2 hours if needed    senna (SENOKOT) 8.6 MG TABS tablet Take 1 tablet  (8.6 mg total) by mouth at bedtime.    simvastatin (ZOCOR) 20 MG tablet Take 1 tablet (20 mg total) by mouth at bedtime.    tamsulosin (FLOMAX) 0.4 MG CAPS capsule TAKE 1 CAPSULE BY MOUTH EVERY DAY    TRULICITY 1.5 KD/3.2IZ SOPN Inject 1.5 mg as directed once a week.  06/04/2019: On thursday   vitamin C (VITAMIN C) 500 MG tablet Take 1 tablet (500 mg total) by mouth daily.    Vitamin D, Ergocalciferol, (DRISDOL) 1.25 MG (50000 UT) CAPS capsule Take 50,000 Units by mouth once a week.    warfarin (COUMADIN) 4 MG tablet Take 1 tablet (4 mg total) by mouth one time only at 6 PM.    zinc sulfate 220 (50 Zn) MG capsule Take 1 capsule (220 mg total) by mouth 2 (two) times daily.    [DISCONTINUED] potassium chloride (K-DUR) 10 MEQ tablet Take 3 tablets (30 mEq total) by mouth daily.    No facility-administered encounter medications on file as of 07/10/2019.    Lab Results  Component Value Date   HGBA1C 7.9 (H) 04/10/2019   HGBA1C 8.8 (H) 01/24/2019   HGBA1C 9.0 (H) 10/24/2018   Lab Results  Component Value Date   MICROALBUR 20 08/16/2014   LDLCALC 33 04/13/2019   CREATININE 5.02 (H) 07/03/2019   Lab Results  Component Value Date   CHOL 81 04/13/2019   HDL 16 (L) 04/13/2019   LDLCALC 33 04/13/2019   TRIG 161 (H) 04/13/2019   CHOLHDL 5.1 04/13/2019   Lab Results  Component Value Date   INR 2.9 (H) 07/03/2019   INR 2.6 (H) 07/02/2019   INR 2.7 (H) 07/01/2019      RN Assessment & Care Plan     Chronic Disease Management Needs       Current Barriers:  Chronic Disease Management support, education, and care coordination needs related to CKD (dialsysis), DM, Hyperlipidemia, arthritis, chronic pain, chronic anticoagulation, chronic respiratory failure  Clinical Goal(s) related to CKD (dialsysis), DM, Hyperlipidemia, arthritis, chronic pain, chronic anticoagulation, chronic respiratory failure:  Over the next 60 days, patient will:  Work with the care management team to address  educational, disease management, and care coordination needs  Begin or continue self health monitoring activities as directed today Measure and record cbg (blood glucose) 3 times daily Call provider office for new or worsened signs and symptoms Blood glucose findings outside established parameters Call care management team with questions or concerns Verbalize basic understanding of patient centered plan of care established today  Interventions related to CKD (dialsysis), DM, Hyperlipidemia, arthritis, chronic pain, chronic anticoagulation, chronic respiratory failure:  Evaluation of current treatment plans and patient's adherence to plan as established by provider Assessed patient understanding of disease states Assessed patient's education and care coordination needs Provided disease specific education to patient  Collaborated with appropriate clinical care team members regarding patient  needs  Patient Self Care Activities related to CKD (dialsysis), DM, Hyperlipidemia, arthritis, chronic pain, chronic anticoagulation, chronic respiratory failure:  Patient is unable to independently self-manage chronic health conditions  Initial goal documentation      Warfarin Management       Current Barriers:  Knowledge Deficits related to warfarin management Chronic Disease Management support and education needs related to chronic anticoagulation  Nurse Case Manager Clinical Goal(s):  Over the next 15 days, patient will have home INR testing setup through [facility]  Interventions:  Evaluation of current treatment plan related to chronic anticoagulation and patient's adherence to plan as established by provider. Advised patient to call me at [phone] if they have not been contacted about home INR testing within the next week Reviewed medications with patient and discussed warfarin Discussed plans with patient for ongoing care management follow up and provided patient with direct  contact information for care management team RN will collaborate with PCP office to arrange home INR testing through [facility]  Patient Self Care Activities:  Patient verbalizes understanding of plan to arrange home INR testing Self administers medications as prescribed Calls provider office for new concerns or questions Unable to independently walk or drive and perform some ADLs  Initial goal documentation          [name] was given information about Chronic Care Management services today including:  CCM service includes personalized support from designated clinical staff supervised by his physician, including individualized plan of care and coordination with other care providers 24/7 contact phone numbers for assistance for urgent and routine care needs. Service will only be billed when office clinical staff spend 20 minutes or more in a month to coordinate care. Only one practitioner may furnish and bill the service in a calendar month. The patient may stop CCM services at any time (effective at the end of the month) by phone call to the office staff. The patient will be responsible for cost sharing (co-pay) of up to 20% of the service fee (after annual deductible is met).  Patient agreed to services and verbal consent obtained.   Follow-up Plan  The care management team will reach out to the patient again over the next 15 days.   Chong Sicilian, BSN, RN-BC Embedded Chronic Care Manager [facility] [place] [facility] Direct Dial: [phone]     I have reviewed and agree with the above  documentation.   Evelina Dun, FNP

## 2019-07-11 ENCOUNTER — Other Ambulatory Visit: Payer: Self-pay

## 2019-07-11 ENCOUNTER — Ambulatory Visit (INDEPENDENT_AMBULATORY_CARE_PROVIDER_SITE_OTHER): Payer: Medicare Other | Admitting: Family

## 2019-07-11 ENCOUNTER — Encounter: Payer: Self-pay | Admitting: Family

## 2019-07-11 VITALS — BP 111/46 | HR 72 | Temp 96.6°F

## 2019-07-11 DIAGNOSIS — E1165 Type 2 diabetes mellitus with hyperglycemia: Secondary | ICD-10-CM

## 2019-07-11 DIAGNOSIS — Z86718 Personal history of other venous thrombosis and embolism: Secondary | ICD-10-CM | POA: Diagnosis not present

## 2019-07-11 DIAGNOSIS — N186 End stage renal disease: Secondary | ICD-10-CM | POA: Diagnosis not present

## 2019-07-11 DIAGNOSIS — J810 Acute pulmonary edema: Secondary | ICD-10-CM

## 2019-07-11 DIAGNOSIS — E1129 Type 2 diabetes mellitus with other diabetic kidney complication: Secondary | ICD-10-CM | POA: Diagnosis not present

## 2019-07-11 DIAGNOSIS — D631 Anemia in chronic kidney disease: Secondary | ICD-10-CM | POA: Diagnosis not present

## 2019-07-11 DIAGNOSIS — I959 Hypotension, unspecified: Secondary | ICD-10-CM | POA: Diagnosis not present

## 2019-07-11 DIAGNOSIS — Z7901 Long term (current) use of anticoagulants: Secondary | ICD-10-CM

## 2019-07-11 DIAGNOSIS — Z794 Long term (current) use of insulin: Secondary | ICD-10-CM

## 2019-07-11 DIAGNOSIS — D689 Coagulation defect, unspecified: Secondary | ICD-10-CM | POA: Diagnosis not present

## 2019-07-11 DIAGNOSIS — Z992 Dependence on renal dialysis: Secondary | ICD-10-CM | POA: Diagnosis not present

## 2019-07-11 DIAGNOSIS — N2581 Secondary hyperparathyroidism of renal origin: Secondary | ICD-10-CM | POA: Diagnosis not present

## 2019-07-11 LAB — COAGUCHEK XS/INR WAIVED
INR: 4.3 — ABNORMAL HIGH (ref 0.9–1.1)
Prothrombin Time: 51.9 s

## 2019-07-11 NOTE — Progress Notes (Signed)
Subjective:    Patient ID: [name], male    DOB: 1966/05/01, 54 y.o.   MRN: [id number]  Chief Complaint  Patient presents with  . Coagulation Disorder    HPI Pt presents to the office today for INR recheck. He has had multiple hospital admissions and is currently ESRD and getting dialysis Tuesday, Thursday, and Saturday.  See anticoagulation flow sheet.   His BP is very low today.  He states he is weak.    Review of Systems  Constitutional: Positive for fatigue.  Respiratory: Positive for shortness of breath.   All other systems reviewed and are negative.      Objective:   Physical Exam Vitals reviewed.  Constitutional:      General: He is not in acute distress.    Appearance: He is well-developed. He is obese.  HENT:     Head: Normocephalic.     Right Ear: Tympanic membrane normal.     Left Ear: Tympanic membrane normal.  Eyes:     General:        Right eye: No discharge.        Left eye: No discharge.     Pupils: Pupils are equal, round, and reactive to light.  Neck:     Thyroid: No thyromegaly.  Cardiovascular:     Rate and Rhythm: Normal rate and regular rhythm.     Heart sounds: Normal heart sounds. No murmur.  Pulmonary:     Effort: Pulmonary effort is normal. No respiratory distress.     Breath sounds: Normal breath sounds. No wheezing.  Abdominal:     General: Bowel sounds are normal. There is no distension.     Palpations: Abdomen is soft.     Tenderness: There is no abdominal tenderness.  Musculoskeletal:        General: No tenderness.     Cervical back: Normal range of motion and neck supple.  Skin:    General: Skin is warm and dry.     Coloration: Skin is pale.     Findings: No erythema or rash.  Neurological:     Mental Status: He is alert and oriented to person, place, and time.     Cranial Nerves: No cranial nerve deficit.     Motor: Weakness present.     Gait: Gait abnormal.     Deep Tendon Reflexes: Reflexes are normal and  symmetric.     Comments: Tremors present  Psychiatric:        Behavior: Behavior normal.        Thought Content: Thought content normal.        Judgment: Judgment normal.       BP (!) 111/46   Pulse 72   Temp (!) 96.6 F (35.9 C) (Temporal)   SpO2 100%      Assessment & Plan:  KHALIQUE BORG comes in today with chief complaint of Coagulation Disorder   Diagnosis and orders addressed:  1. Chronic anticoagulation - inr FINGERSTICK  2. ESRD (end stage renal disease) ([place])  3. Postoperative pulmonary edema (HCC)  4. History of DVT (deep vein thrombosis)  5. Hypotension, unspecified hypotension type   6. Type 2 diabetes mellitus with hyperglycemia, with long-term current use of insulin (HCC)   BP improved, will hold off on sending to ED as this was his baseline at discharge. We have d/c Zocor and Jardiance today off his medication list.   Keep dialysis appt today and if BP drops again may need  to go to ED. RTO in 1-2 weeks. I have completed forms for him to get home INR.    Evelina Dun, FNP

## 2019-07-11 NOTE — Patient Instructions (Addendum)
Description   Decrease  current dose to 3 mg (1 tablet) every day.   INR today was 4.3 (hold today and tomorrow) and your goal is 2.0-3.0

## 2019-07-12 DIAGNOSIS — E1165 Type 2 diabetes mellitus with hyperglycemia: Secondary | ICD-10-CM | POA: Diagnosis not present

## 2019-07-12 DIAGNOSIS — G9341 Metabolic encephalopathy: Secondary | ICD-10-CM | POA: Diagnosis not present

## 2019-07-12 DIAGNOSIS — N185 Chronic kidney disease, stage 5: Secondary | ICD-10-CM | POA: Diagnosis not present

## 2019-07-12 DIAGNOSIS — N186 End stage renal disease: Secondary | ICD-10-CM | POA: Diagnosis not present

## 2019-07-12 DIAGNOSIS — I120 Hypertensive chronic kidney disease with stage 5 chronic kidney disease or end stage renal disease: Secondary | ICD-10-CM | POA: Diagnosis not present

## 2019-07-12 DIAGNOSIS — J9611 Chronic respiratory failure with hypoxia: Secondary | ICD-10-CM | POA: Diagnosis not present

## 2019-07-12 DIAGNOSIS — N179 Acute kidney failure, unspecified: Secondary | ICD-10-CM | POA: Diagnosis not present

## 2019-07-12 DIAGNOSIS — G894 Chronic pain syndrome: Secondary | ICD-10-CM | POA: Diagnosis not present

## 2019-07-12 DIAGNOSIS — N281 Cyst of kidney, acquired: Secondary | ICD-10-CM | POA: Diagnosis not present

## 2019-07-12 DIAGNOSIS — R310 Gross hematuria: Secondary | ICD-10-CM | POA: Diagnosis not present

## 2019-07-12 DIAGNOSIS — G4733 Obstructive sleep apnea (adult) (pediatric): Secondary | ICD-10-CM | POA: Diagnosis not present

## 2019-07-12 DIAGNOSIS — N390 Urinary tract infection, site not specified: Secondary | ICD-10-CM | POA: Diagnosis not present

## 2019-07-12 DIAGNOSIS — E1122 Type 2 diabetes mellitus with diabetic chronic kidney disease: Secondary | ICD-10-CM | POA: Diagnosis not present

## 2019-07-12 DIAGNOSIS — D631 Anemia in chronic kidney disease: Secondary | ICD-10-CM | POA: Diagnosis not present

## 2019-07-13 DIAGNOSIS — Z992 Dependence on renal dialysis: Secondary | ICD-10-CM | POA: Diagnosis not present

## 2019-07-13 DIAGNOSIS — N2581 Secondary hyperparathyroidism of renal origin: Secondary | ICD-10-CM | POA: Diagnosis not present

## 2019-07-13 DIAGNOSIS — E1129 Type 2 diabetes mellitus with other diabetic kidney complication: Secondary | ICD-10-CM | POA: Diagnosis not present

## 2019-07-13 DIAGNOSIS — D631 Anemia in chronic kidney disease: Secondary | ICD-10-CM | POA: Diagnosis not present

## 2019-07-13 DIAGNOSIS — N186 End stage renal disease: Secondary | ICD-10-CM | POA: Diagnosis not present

## 2019-07-13 DIAGNOSIS — D689 Coagulation defect, unspecified: Secondary | ICD-10-CM | POA: Diagnosis not present

## 2019-07-14 DIAGNOSIS — G8194 Hemiplegia, unspecified affecting left nondominant side: Secondary | ICD-10-CM | POA: Diagnosis not present

## 2019-07-14 DIAGNOSIS — I120 Hypertensive chronic kidney disease with stage 5 chronic kidney disease or end stage renal disease: Secondary | ICD-10-CM | POA: Diagnosis not present

## 2019-07-14 DIAGNOSIS — N390 Urinary tract infection, site not specified: Secondary | ICD-10-CM | POA: Diagnosis not present

## 2019-07-14 DIAGNOSIS — J449 Chronic obstructive pulmonary disease, unspecified: Secondary | ICD-10-CM | POA: Diagnosis not present

## 2019-07-14 DIAGNOSIS — E1122 Type 2 diabetes mellitus with diabetic chronic kidney disease: Secondary | ICD-10-CM | POA: Diagnosis not present

## 2019-07-14 DIAGNOSIS — E1165 Type 2 diabetes mellitus with hyperglycemia: Secondary | ICD-10-CM | POA: Diagnosis not present

## 2019-07-14 DIAGNOSIS — N179 Acute kidney failure, unspecified: Secondary | ICD-10-CM | POA: Diagnosis not present

## 2019-07-14 DIAGNOSIS — N186 End stage renal disease: Secondary | ICD-10-CM | POA: Diagnosis not present

## 2019-07-14 DIAGNOSIS — J9611 Chronic respiratory failure with hypoxia: Secondary | ICD-10-CM | POA: Diagnosis not present

## 2019-07-14 DIAGNOSIS — G4733 Obstructive sleep apnea (adult) (pediatric): Secondary | ICD-10-CM | POA: Diagnosis not present

## 2019-07-14 DIAGNOSIS — D631 Anemia in chronic kidney disease: Secondary | ICD-10-CM | POA: Diagnosis not present

## 2019-07-14 DIAGNOSIS — G894 Chronic pain syndrome: Secondary | ICD-10-CM | POA: Diagnosis not present

## 2019-07-14 DIAGNOSIS — G9341 Metabolic encephalopathy: Secondary | ICD-10-CM | POA: Diagnosis not present

## 2019-07-15 DIAGNOSIS — D689 Coagulation defect, unspecified: Secondary | ICD-10-CM | POA: Diagnosis not present

## 2019-07-15 DIAGNOSIS — Z992 Dependence on renal dialysis: Secondary | ICD-10-CM | POA: Diagnosis not present

## 2019-07-15 DIAGNOSIS — D631 Anemia in chronic kidney disease: Secondary | ICD-10-CM | POA: Diagnosis not present

## 2019-07-15 DIAGNOSIS — N2581 Secondary hyperparathyroidism of renal origin: Secondary | ICD-10-CM | POA: Diagnosis not present

## 2019-07-15 DIAGNOSIS — N186 End stage renal disease: Secondary | ICD-10-CM | POA: Diagnosis not present

## 2019-07-15 DIAGNOSIS — E1129 Type 2 diabetes mellitus with other diabetic kidney complication: Secondary | ICD-10-CM | POA: Diagnosis not present

## 2019-07-17 ENCOUNTER — Ambulatory Visit (INDEPENDENT_AMBULATORY_CARE_PROVIDER_SITE_OTHER): Payer: Medicare Other

## 2019-07-17 ENCOUNTER — Other Ambulatory Visit: Payer: Self-pay

## 2019-07-17 ENCOUNTER — Telehealth: Payer: Self-pay | Admitting: Family

## 2019-07-17 ENCOUNTER — Other Ambulatory Visit: Payer: Self-pay | Admitting: Family

## 2019-07-17 DIAGNOSIS — E1122 Type 2 diabetes mellitus with diabetic chronic kidney disease: Secondary | ICD-10-CM | POA: Diagnosis not present

## 2019-07-17 DIAGNOSIS — N186 End stage renal disease: Secondary | ICD-10-CM | POA: Diagnosis not present

## 2019-07-17 DIAGNOSIS — N179 Acute kidney failure, unspecified: Secondary | ICD-10-CM | POA: Diagnosis not present

## 2019-07-17 DIAGNOSIS — D631 Anemia in chronic kidney disease: Secondary | ICD-10-CM

## 2019-07-17 DIAGNOSIS — N390 Urinary tract infection, site not specified: Secondary | ICD-10-CM | POA: Diagnosis not present

## 2019-07-17 DIAGNOSIS — F3130 Bipolar disorder, current episode depressed, mild or moderate severity, unspecified: Secondary | ICD-10-CM

## 2019-07-17 DIAGNOSIS — K219 Gastro-esophageal reflux disease without esophagitis: Secondary | ICD-10-CM

## 2019-07-17 DIAGNOSIS — G894 Chronic pain syndrome: Secondary | ICD-10-CM | POA: Diagnosis not present

## 2019-07-17 DIAGNOSIS — F419 Anxiety disorder, unspecified: Secondary | ICD-10-CM

## 2019-07-17 DIAGNOSIS — G9341 Metabolic encephalopathy: Secondary | ICD-10-CM | POA: Diagnosis not present

## 2019-07-17 DIAGNOSIS — G4733 Obstructive sleep apnea (adult) (pediatric): Secondary | ICD-10-CM

## 2019-07-17 DIAGNOSIS — J9611 Chronic respiratory failure with hypoxia: Secondary | ICD-10-CM

## 2019-07-17 DIAGNOSIS — G40909 Epilepsy, unspecified, not intractable, without status epilepticus: Secondary | ICD-10-CM

## 2019-07-17 DIAGNOSIS — I120 Hypertensive chronic kidney disease with stage 5 chronic kidney disease or end stage renal disease: Secondary | ICD-10-CM | POA: Diagnosis not present

## 2019-07-17 DIAGNOSIS — E1165 Type 2 diabetes mellitus with hyperglycemia: Secondary | ICD-10-CM | POA: Diagnosis not present

## 2019-07-17 DIAGNOSIS — D696 Thrombocytopenia, unspecified: Secondary | ICD-10-CM

## 2019-07-17 MED ORDER — WARFARIN SODIUM 3 MG PO TABS: 3.0000 mg | ORAL_TABLET | Freq: Every day | ORAL | 2 refills | Status: DC

## 2019-07-17 NOTE — Telephone Encounter (Signed)
Med sent to pharmacy.

## 2019-07-17 NOTE — Progress Notes (Signed)
Jan 19th, 2021 [facility] Palliative Care Consult Note Telephone: [phone]  Fax: [phone]  PATIENT NAME: [name] DOB: Sep 20, 1965 MRN: [id number] [place] (12 miles 20 min)  PRIMARY CARE PROVIDER:   Sharion Balloon, FNP Dr Mar Daring (Urology   REFERRING PROVIDER:  Sharion [name], [address],  [place] [address]  Dialysis at [facility] in [place] PT/OT/SN  visit  [name]   RESPONSIBLE PARTY: (spouse) [name] (hospital x-ray tech) [phone] [email or web address]  ASSESSMENT / RECOMMENDATIONS:  1. Advance Care Planning: A. Directives: Reviewed. Would desire full resuscitative efforts in the event of a cardiopulmonary arrest. B. Goals of Care: Wants to be able to stand and pivot to chair. Last able to do so was October. Currently working with Physical Therapist with goal of standing up. We discussed importance of religiously doing prescribed exercises as outlined by PT. He has been told he would be a poor candidate for kidney transplant d/t co-morbid illnesses.   2. Symptom Management: A. Pain: Over all arthritic pain concentrated in joints and lower back. He has been [facility] decreased dose his dose about 2 months ago. He has Hydrocodone Acetaminophen 7.5-325mg which he uses only sparingly (one tab a few times a week). We discussed the current thought that opioids are not a good choice for skeletal pain. Anticipate further wean down of Fentanyl patch to discontinue. Recommended Tylenol [id number]. Patient stated he would give this a try.   3. Cognitive / Functional status: Patient is A & O x 3. Has an UE resting tremor which waxes and wanes in intensity. He takes Keppra for h/o seizures; none for years. H/O mood disorder well managed on current regimen. He is bed bound; electric bed. Hoyer lift to  transfer to wheelchair. Dependent for assist with ADLs; able to feed himself. Transports to hemodialysis via handicap [facility] by spouse. CBG's range 120-200; managed with Solostar bid and sliding scale Humalog qac.  Height is 6'3" His current weight is 413lbs. He lost about 30 lbs over the last month. At 6'3" his BMI is 51.6kg/m2. Patient wears O2 at 2-4 LPM. Has F[facility] catheter[facility] Currently minimal urine output (200-30024m4hr). Very small amount breakdown sacral area by spouse report. Discussed importance of turning side to side q2hr to discourage pressure injuries.  -discussed he can wean down FiO2 as long as maintain oxygen sats mid to upper 90's.   4. 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  Married almost 7 years to extremely supportive spouse, Patrick Bambergatient has a son age 23 99revious marriage).  Patient in good spirits with positive outlook. Feels so much better since initiation of dialysis "feel like I'm back from the dead".   5. Follow up Palliative Care Visit: will call to schedule in 2-3 months.   I spent 60 minutes providing this consultation from 9:30am to 10:30am. More than 50% of the time in this consultation was spent coordinating communication.   HISTORY OF PRESENT ILLNESS:  [name] a 53 25ar old male with history of chronic kidney disease stage V (hemodialysis), chronic hypoxic respiratory failure on 3 L , OSA, OHS, diabetes mellitus type 2, DVT/PE (coumadin), hemorrhagic R kidney cyst (CT scan; conservative management finasteride/Foley), anemia of chronic disease/acute blood loss, mood disorder/bipolar (BuSpar, Cymbalta,  Lamictal; psych f/u), seizure d/r (Keppra), chronic pain syndrome, morbid obesity, debility (WC bound baseline), mild confusion (sedative medications). 10/11-10/27/2020 hospitalized with COVID pneumonia. 11/10-11/16/2020  hospitalization 16/02/6788-38/03/1750 UTI complicated pyelonephritis/gross hematuria/inability to void/pain, acute on chronic CKD creatinine of 5.42.   12/13-07/03/2019 hospitalization. Initiated hemodialysis (06/15/2019). Tx'd 5 units PRBC.  Palliative Care was asked to help address goals of care.   CODE STATUS: Full Code  PPS: 30%  HOSPICE ELIGIBILITY/DIAGNOSIS: TBD  PAST MEDICAL HISTORY:  Past Medical History:  Diagnosis Date  . Anemia   . Anxiety   . Arthritis   . Bipolar 1 disorder ([place])   . Bronchitis    hx of  . Bruises easily   . Chronic kidney disease    decreased left kidney fx  . Chronic pain syndrome 05/11/2012  . Chronic respiratory failure with hypoxia (HCC)    And with hypercapnia  . Diabetes mellitus without complication ([place])   . Diabetic neuropathy ([place])   . DVT (deep venous thrombosis) (HCC)    LLE DVT ~ '12  . Dyspnea    with ambulation  . GERD (gastroesophageal reflux disease)   . History of 2019 novel coronavirus disease (COVID-19)   . HOH (hard of hearing) 2015   has hearing aids  . Mental disorder   . Migraine   . Neuromuscular disorder ([place])   . Obesity hypoventilation syndrome ([place])   . Obstructive sleep apnea    CPAP  . PE (pulmonary embolism)    bilateral PE ~ '11  . Seizures ([place])   . Thrombocytopenia ([place]) 05/11/2012    SOCIAL HX:  Social History   Tobacco Use  . Smoking status: Never Smoker  . Smokeless tobacco: Never Used  Substance Use Topics  . Alcohol use: No    ALLERGIES:  Allergies  Allergen Reactions  . Bee Venom Anaphylaxis, Shortness Of Breath and Swelling  . Other Shortness Of Breath    Itching, rash with IVP DYE, iodine, shellfish LATEX  . Shellfish Allergy Nausea And Vomiting and Other (See Comments)    Feels like insides are twisting  . Iodinated Diagnostic Agents     Other reaction(s): RASH  . Iohexol      Code: RASH, Desc: PT WAS ON PREDNISONE FOR GOUT TX. @ TIME OF SCAN AND RECEIVED 50 MG OF BENADRYL  IV-ARS 10/08/07   . Iodine Rash  . Latex Rash     PERTINENT MEDICATIONS:  Outpatient Encounter Medications as of 07/18/2019  Medication Sig  . albuterol (VENTOLIN HFA) 108 (90 Base) MCG/ACT inhaler TAKE 2 PUFFS BY MOUTH EVERY 6 HOURS AS NEEDED FOR WHEEZE OR SHORTNESS OF BREATH  . busPIRone (BUSPAR) 15 MG tablet TAKE 1 TABLET (15 MG TOTAL) BY MOUTH 2 (TWO) TIMES DAILY. (Patient taking differently: Take 15 mg by mouth 2 (two) times daily. TAKE 1 TABLET (15 MG TOTAL) BY MOUTH 2 (TWO) TIMES DAILY.)  . calcium acetate (PHOSLO) 667 MG capsule Take 2 capsules (1,334 mg total) by mouth 3 (three) times daily with meals.  . cetirizine (ZYRTEC) 10 MG tablet Take 1 tablet (10 mg total) by mouth daily.  . clomiPHENE (CLOMID) 50 MG tablet Take 0.5 tablets (25 mg total) by mouth every other day.  . DULoxetine (CYMBALTA) 60 MG capsule TAKE 1 CAPSULE BY MOUTH EVERY DAY (Patient taking differently: Take 60 mg by mouth daily. )  . EMGALITY 120 MG/ML SOAJ Inject 120 mg as directed every 30 (thirty) days.   Marland Kitchen EPIPEN 2-PAK 0.3 MG/0.3ML SOAJ injection INJECT 0.3  MLS (0.3 MG TOTAL) INTO THE MUSCLE ONCE. AS NEEDED FOR ANAPHYLACTIC REACTION (Patient taking differently: Inject 0.3 mg into the muscle as needed. )  . esomeprazole (NEXIUM) 40 MG capsule TAKE 1 CAPSULE (40 MG TOTAL) BY MOUTH DAILY AT 12 NOON.  . fentaNYL (DURAGESIC) 50 MCG/HR Place 1 patch onto the skin every 3 (three) days.  . fluticasone (FLONASE) 50 MCG/ACT nasal spray PLACE 1 SPRAY INTO BOTH NOSTRILS 2 (TWO) TIMES DAILY AS NEEDED FOR ALLERGIES.  Marland Kitchen HYDROcodone-acetaminophen (NORCO) 7.5-325 MG tablet Take 1 tablet by mouth every 6 (six) hours as needed for moderate pain.  . Insulin Glargine, 1 Unit Dial, (TOUJEO SOLOSTAR) 300 UNIT/ML SOPN Inject 45 Units into the skin 2 (two) times daily.  Marland Kitchen lamoTRIgine (LAMICTAL) 200 MG tablet TAKE 1 TABLET BY MOUTH TWICE A DAY (Patient taking differently: Take 200 mg by mouth 2 (two) times daily. )  . levETIRAcetam  (KEPPRA) 500 MG tablet Take 1 tablet (500 mg total) by mouth 2 (two) times daily.  Marland Kitchen NARCAN 4 MG/0.1ML LIQD nasal spray kit Place 1 spray into the nose once.   . polyethylene glycol (MIRALAX / GLYCOLAX) 17 g packet Take 17 g by mouth 2 (two) times daily.  . pregabalin (LYRICA) 50 MG capsule Take 1 capsule (50 mg total) by mouth 2 (two) times daily.  . rizatriptan (MAXALT) 10 MG tablet Take 1 tablet (10 mg total) by mouth as needed. May repeat in 2 hours if needed  . senna (SENOKOT) 8.6 MG TABS tablet Take 1 tablet (8.6 mg total) by mouth at bedtime.  . tamsulosin (FLOMAX) 0.4 MG CAPS capsule TAKE 1 CAPSULE BY MOUTH EVERY DAY  . TRULICITY 1.5 QK/8.6NO SOPN Inject 1.5 mg as directed once a week.   . vitamin C (VITAMIN C) 500 MG tablet Take 1 tablet (500 mg total) by mouth daily.  . Vitamin D, Ergocalciferol, (DRISDOL) 1.25 MG (50000 UT) CAPS capsule Take 50,000 Units by mouth once a week.  . warfarin (COUMADIN) 3 MG tablet Take 1 tablet (3 mg total) by mouth daily.  Marland Kitchen zinc sulfate 220 (50 Zn) MG capsule Take 1 capsule (220 mg total) by mouth 2 (two) times daily.  . [DISCONTINUED] finasteride (PROSCAR) 5 MG tablet Take 1 tablet (5 mg total) by mouth daily.  . [DISCONTINUED] potassium chloride (K-DUR) 10 MEQ tablet Take 3 tablets (30 mEq total) by mouth daily.   No facility-administered encounter medications on file as of 07/18/2019.    PHYSICAL EXAM:   Morbidly obese, pleasantly conversant middle-aged gentleman lying supine in hospital bed. Spouse Estill [name] is in attendance.  PE deferred to decreased risk of COVID exposure. Neurological: Weakness but otherwise non-focal  Julianne Handler, NP

## 2019-07-17 NOTE — Telephone Encounter (Signed)
Pt aware paperwork was faxed to [facility] last week & they will be contacting them

## 2019-07-18 ENCOUNTER — Encounter: Payer: Self-pay | Admitting: Internal Medicine

## 2019-07-18 ENCOUNTER — Other Ambulatory Visit: Payer: Self-pay

## 2019-07-18 ENCOUNTER — Other Ambulatory Visit: Payer: Medicare Other | Admitting: Internal Medicine

## 2019-07-18 ENCOUNTER — Other Ambulatory Visit (HOSPITAL_COMMUNITY)
Admission: RE | Admit: 2019-07-18 | Discharge: 2019-07-18 | Disposition: A | Discharge status: Home / Self Care | Payer: Medicare Other | Source: Other Acute Inpatient Hospital | Attending: Urology | Admitting: Urology

## 2019-07-18 DIAGNOSIS — Z992 Dependence on renal dialysis: Secondary | ICD-10-CM | POA: Diagnosis not present

## 2019-07-18 DIAGNOSIS — Z7189 Other specified counseling: Secondary | ICD-10-CM

## 2019-07-18 DIAGNOSIS — D689 Coagulation defect, unspecified: Secondary | ICD-10-CM | POA: Diagnosis not present

## 2019-07-18 DIAGNOSIS — E1129 Type 2 diabetes mellitus with other diabetic kidney complication: Secondary | ICD-10-CM | POA: Diagnosis not present

## 2019-07-18 DIAGNOSIS — Z515 Encounter for palliative care: Secondary | ICD-10-CM | POA: Diagnosis not present

## 2019-07-18 DIAGNOSIS — I120 Hypertensive chronic kidney disease with stage 5 chronic kidney disease or end stage renal disease: Secondary | ICD-10-CM | POA: Insufficient documentation

## 2019-07-18 DIAGNOSIS — N2581 Secondary hyperparathyroidism of renal origin: Secondary | ICD-10-CM | POA: Diagnosis not present

## 2019-07-18 DIAGNOSIS — N186 End stage renal disease: Secondary | ICD-10-CM | POA: Insufficient documentation

## 2019-07-18 DIAGNOSIS — D631 Anemia in chronic kidney disease: Secondary | ICD-10-CM | POA: Diagnosis not present

## 2019-07-19 DIAGNOSIS — G9341 Metabolic encephalopathy: Secondary | ICD-10-CM | POA: Diagnosis not present

## 2019-07-19 DIAGNOSIS — J9611 Chronic respiratory failure with hypoxia: Secondary | ICD-10-CM | POA: Diagnosis not present

## 2019-07-19 DIAGNOSIS — E1122 Type 2 diabetes mellitus with diabetic chronic kidney disease: Secondary | ICD-10-CM | POA: Diagnosis not present

## 2019-07-19 DIAGNOSIS — I120 Hypertensive chronic kidney disease with stage 5 chronic kidney disease or end stage renal disease: Secondary | ICD-10-CM | POA: Diagnosis not present

## 2019-07-19 DIAGNOSIS — E1165 Type 2 diabetes mellitus with hyperglycemia: Secondary | ICD-10-CM | POA: Diagnosis not present

## 2019-07-19 DIAGNOSIS — D631 Anemia in chronic kidney disease: Secondary | ICD-10-CM | POA: Diagnosis not present

## 2019-07-19 DIAGNOSIS — N179 Acute kidney failure, unspecified: Secondary | ICD-10-CM | POA: Diagnosis not present

## 2019-07-19 DIAGNOSIS — N186 End stage renal disease: Secondary | ICD-10-CM | POA: Diagnosis not present

## 2019-07-19 DIAGNOSIS — G894 Chronic pain syndrome: Secondary | ICD-10-CM | POA: Diagnosis not present

## 2019-07-19 DIAGNOSIS — G4733 Obstructive sleep apnea (adult) (pediatric): Secondary | ICD-10-CM | POA: Diagnosis not present

## 2019-07-19 DIAGNOSIS — N390 Urinary tract infection, site not specified: Secondary | ICD-10-CM | POA: Diagnosis not present

## 2019-07-21 LAB — URINE CULTURE: Culture: >=100000 — AB

## 2019-07-22 DIAGNOSIS — Z992 Dependence on renal dialysis: Secondary | ICD-10-CM | POA: Diagnosis not present

## 2019-07-22 DIAGNOSIS — D689 Coagulation defect, unspecified: Secondary | ICD-10-CM | POA: Diagnosis not present

## 2019-07-22 DIAGNOSIS — N186 End stage renal disease: Secondary | ICD-10-CM | POA: Diagnosis not present

## 2019-07-22 DIAGNOSIS — D631 Anemia in chronic kidney disease: Secondary | ICD-10-CM | POA: Diagnosis not present

## 2019-07-22 DIAGNOSIS — E1129 Type 2 diabetes mellitus with other diabetic kidney complication: Secondary | ICD-10-CM | POA: Diagnosis not present

## 2019-07-22 DIAGNOSIS — N2581 Secondary hyperparathyroidism of renal origin: Secondary | ICD-10-CM | POA: Diagnosis not present

## 2019-07-23 DIAGNOSIS — I279 Pulmonary heart disease, unspecified: Secondary | ICD-10-CM | POA: Diagnosis not present

## 2019-07-23 DIAGNOSIS — J449 Chronic obstructive pulmonary disease, unspecified: Secondary | ICD-10-CM | POA: Diagnosis not present

## 2019-07-23 DIAGNOSIS — G8194 Hemiplegia, unspecified affecting left nondominant side: Secondary | ICD-10-CM | POA: Diagnosis not present

## 2019-07-24 ENCOUNTER — Other Ambulatory Visit: Payer: Self-pay | Admitting: Urology

## 2019-07-24 ENCOUNTER — Other Ambulatory Visit: Payer: Self-pay

## 2019-07-24 DIAGNOSIS — N186 End stage renal disease: Secondary | ICD-10-CM | POA: Diagnosis not present

## 2019-07-24 DIAGNOSIS — Z992 Dependence on renal dialysis: Secondary | ICD-10-CM | POA: Diagnosis not present

## 2019-07-24 DIAGNOSIS — N2581 Secondary hyperparathyroidism of renal origin: Secondary | ICD-10-CM | POA: Diagnosis not present

## 2019-07-24 DIAGNOSIS — E1129 Type 2 diabetes mellitus with other diabetic kidney complication: Secondary | ICD-10-CM | POA: Diagnosis not present

## 2019-07-24 DIAGNOSIS — D689 Coagulation defect, unspecified: Secondary | ICD-10-CM | POA: Diagnosis not present

## 2019-07-24 DIAGNOSIS — D631 Anemia in chronic kidney disease: Secondary | ICD-10-CM | POA: Diagnosis not present

## 2019-07-24 DIAGNOSIS — R310 Gross hematuria: Secondary | ICD-10-CM

## 2019-07-24 MED ORDER — DIPHENHYDRAMINE HCL 50 MG PO TABS: 50.0000 mg | ORAL_TABLET | Freq: Once | ORAL | 0 refills | Status: DC

## 2019-07-24 MED ORDER — PREDNISONE 50 MG PO TABS: ORAL_TABLET | ORAL | 0 refills | Status: DC

## 2019-07-24 NOTE — Progress Notes (Signed)
Orders placed for CT as well as pre-contrast prophylaxis

## 2019-07-25 ENCOUNTER — Ambulatory Visit: Payer: Medicare Other | Admitting: Family

## 2019-07-25 ENCOUNTER — Ambulatory Visit: Payer: Medicare Other | Admitting: *Deleted

## 2019-07-25 DIAGNOSIS — G894 Chronic pain syndrome: Secondary | ICD-10-CM | POA: Diagnosis not present

## 2019-07-25 DIAGNOSIS — Z7901 Long term (current) use of anticoagulants: Secondary | ICD-10-CM

## 2019-07-25 DIAGNOSIS — E1165 Type 2 diabetes mellitus with hyperglycemia: Secondary | ICD-10-CM

## 2019-07-25 DIAGNOSIS — Z86718 Personal history of other venous thrombosis and embolism: Secondary | ICD-10-CM | POA: Diagnosis not present

## 2019-07-25 DIAGNOSIS — Z79899 Other long term (current) drug therapy: Secondary | ICD-10-CM | POA: Diagnosis not present

## 2019-07-25 DIAGNOSIS — M549 Dorsalgia, unspecified: Secondary | ICD-10-CM | POA: Diagnosis not present

## 2019-07-25 NOTE — Chronic Care Management (AMB) (Signed)
  Chronic Care Management   Follow Up Note   07/25/2019 Name: [name] MRN: [id number] DOB: 28-Sep-1965  Referred by: Sharion Balloon, FNP Reason for referral : Chronic Care Management (RN follow up)   [name] is a 54 y.o. year old male who is a primary care patient of Sharion Balloon, FNP. The CCM team was consulted for assistance with chronic disease management and care coordination needs.    Review of patient status, including review of consultants reports, relevant laboratory and other test results, and collaboration with appropriate care team members and the patient's provider was performed as part of comprehensive patient evaluation and provision of chronic care management services.    Goals Addressed            This Visit's Progress     Patient Stated   . Schedule follow-up with PCP (pt-stated)       Current Barriers:  . Chronic Disease Management support and education needs related to diabetes, chronic respiratory failure, hyperlipidemia, chronic anticoagulation  Nurse Case Manager Clinical Goal(s):  [name] Over the next 14 days, patient will keep appointment with PCP  Interventions:  . Chart reviewed . Talked with patient/wife by telephone . Appointment scheduled with PCP for follow-up on 08/08/2019 . Encouraged them to reach out to PCP or CCM team sooner if necessary  Patient Self Care Activities:  . Performs ADL's independently . Wife assists with some ADLs and all IADLs  Initial goal documentation       . Warfarin Management       Current Barriers:  [name] Knowledge Deficits related to warfarin management . Chronic Disease Management support and education needs related to chronic anticoagulation  Nurse Case Manager Clinical Goal(s):  [name] Over the next 90 days, patient will monitor his INR remotely once per week as directed  Interventions:  . Chart reviewed . Talked with patient/wife regarding home INR testing o Verified that they were able to  setup home testing through [facility] and that his first test was this morning o INR was 2.1 . Encouraged them to test weekly as directly . Encouraged them to reach out to the CCM team as needed  Patient Self Care Activities:  . Patient verbalizes understanding of plan to arrange home INR testing . Self administers medications as prescribed . Calls provider office for new concerns or questions . Unable to independently walk or drive and perform some ADLs  Please see past updates related to this goal by clicking on the "Past Updates" button in the selected goal          Plan:   The care management team will reach out to the patient again over the next 60 days.    [name], BSN, RN-BC Embedded Chronic Care Manager [facility] [facility] Direct Dial: [phone]

## 2019-07-25 NOTE — Patient Instructions (Signed)
Visit Information  Goals Addressed            This Visit's Progress     Patient Stated   . Schedule follow-up with PCP (pt-stated)       Current Barriers:  . Chronic Disease Management support and education needs related to diabetes, chronic respiratory failure, hyperlipidemia, chronic anticoagulation  Nurse Case Manager Clinical Goal(s):  [name] Over the next 14 days, patient will keep appointment with PCP  Interventions:  . Chart reviewed . Talked with patient/wife by telephone . Appointment scheduled with PCP for follow-up on 08/08/2019 . Encouraged them to reach out to PCP or CCM team sooner if necessary  Patient Self Care Activities:  . Performs ADL's independently . Wife assists with some ADLs and all IADLs  Initial goal documentation       Other   . Warfarin Management       Current Barriers:  [name] Knowledge Deficits related to warfarin management . Chronic Disease Management support and education needs related to chronic anticoagulation  Nurse Case Manager Clinical Goal(s):  [name] Over the next 90 days, patient will monitor his INR remotely once per week as directed  Interventions:  . Chart reviewed . Talked with patient/wife regarding home INR testing o Verified that they were able to setup home testing through [facility] and that his first test was this morning . Encouraged them to test weekly as directly . Encouraged them to reach out to the CCM team as needed  Patient Self Care Activities:  . Patient verbalizes understanding of plan to arrange home INR testing . Self administers medications as prescribed . Calls provider office for new concerns or questions . Unable to independently walk or drive and perform some ADLs  Please see past updates related to this goal by clicking on the "Past Updates" button in the selected goal        The care management team will reach out to the patient again over the next 60 days.   Chong Sicilian, BSN, RN-BC Embedded  Chronic Care Manager [facility] [place] [facility] Direct Dial: [phone]   The patient verbalized understanding of instructions provided today and declined a print copy of patient instruction materials.

## 2019-07-26 DIAGNOSIS — D631 Anemia in chronic kidney disease: Secondary | ICD-10-CM | POA: Diagnosis not present

## 2019-07-26 DIAGNOSIS — E1129 Type 2 diabetes mellitus with other diabetic kidney complication: Secondary | ICD-10-CM | POA: Diagnosis not present

## 2019-07-26 DIAGNOSIS — Z992 Dependence on renal dialysis: Secondary | ICD-10-CM | POA: Diagnosis not present

## 2019-07-26 DIAGNOSIS — N2581 Secondary hyperparathyroidism of renal origin: Secondary | ICD-10-CM | POA: Diagnosis not present

## 2019-07-26 DIAGNOSIS — D689 Coagulation defect, unspecified: Secondary | ICD-10-CM | POA: Diagnosis not present

## 2019-07-26 DIAGNOSIS — N186 End stage renal disease: Secondary | ICD-10-CM | POA: Diagnosis not present

## 2019-07-27 DIAGNOSIS — G894 Chronic pain syndrome: Secondary | ICD-10-CM | POA: Diagnosis not present

## 2019-07-27 DIAGNOSIS — N390 Urinary tract infection, site not specified: Secondary | ICD-10-CM | POA: Diagnosis not present

## 2019-07-27 DIAGNOSIS — G9341 Metabolic encephalopathy: Secondary | ICD-10-CM | POA: Diagnosis not present

## 2019-07-27 DIAGNOSIS — D631 Anemia in chronic kidney disease: Secondary | ICD-10-CM | POA: Diagnosis not present

## 2019-07-27 DIAGNOSIS — I120 Hypertensive chronic kidney disease with stage 5 chronic kidney disease or end stage renal disease: Secondary | ICD-10-CM | POA: Diagnosis not present

## 2019-07-27 DIAGNOSIS — E1122 Type 2 diabetes mellitus with diabetic chronic kidney disease: Secondary | ICD-10-CM | POA: Diagnosis not present

## 2019-07-27 DIAGNOSIS — E1165 Type 2 diabetes mellitus with hyperglycemia: Secondary | ICD-10-CM | POA: Diagnosis not present

## 2019-07-27 DIAGNOSIS — G4733 Obstructive sleep apnea (adult) (pediatric): Secondary | ICD-10-CM | POA: Diagnosis not present

## 2019-07-27 DIAGNOSIS — N179 Acute kidney failure, unspecified: Secondary | ICD-10-CM | POA: Diagnosis not present

## 2019-07-27 DIAGNOSIS — N186 End stage renal disease: Secondary | ICD-10-CM | POA: Diagnosis not present

## 2019-07-27 DIAGNOSIS — J9611 Chronic respiratory failure with hypoxia: Secondary | ICD-10-CM | POA: Diagnosis not present

## 2019-07-28 DIAGNOSIS — N186 End stage renal disease: Secondary | ICD-10-CM | POA: Diagnosis not present

## 2019-07-28 DIAGNOSIS — D631 Anemia in chronic kidney disease: Secondary | ICD-10-CM | POA: Diagnosis not present

## 2019-07-28 DIAGNOSIS — N2581 Secondary hyperparathyroidism of renal origin: Secondary | ICD-10-CM | POA: Diagnosis not present

## 2019-07-28 DIAGNOSIS — D689 Coagulation defect, unspecified: Secondary | ICD-10-CM | POA: Diagnosis not present

## 2019-07-28 DIAGNOSIS — E1129 Type 2 diabetes mellitus with other diabetic kidney complication: Secondary | ICD-10-CM | POA: Diagnosis not present

## 2019-07-28 DIAGNOSIS — Z992 Dependence on renal dialysis: Secondary | ICD-10-CM | POA: Diagnosis not present

## 2019-07-30 ENCOUNTER — Other Ambulatory Visit: Payer: Self-pay | Admitting: Family

## 2019-07-30 DIAGNOSIS — E1122 Type 2 diabetes mellitus with diabetic chronic kidney disease: Secondary | ICD-10-CM | POA: Diagnosis not present

## 2019-07-30 DIAGNOSIS — J9611 Chronic respiratory failure with hypoxia: Secondary | ICD-10-CM | POA: Diagnosis not present

## 2019-07-30 DIAGNOSIS — N12 Tubulo-interstitial nephritis, not specified as acute or chronic: Secondary | ICD-10-CM | POA: Diagnosis not present

## 2019-07-30 DIAGNOSIS — N186 End stage renal disease: Secondary | ICD-10-CM | POA: Diagnosis not present

## 2019-07-30 DIAGNOSIS — F411 Generalized anxiety disorder: Secondary | ICD-10-CM

## 2019-07-30 DIAGNOSIS — Z992 Dependence on renal dialysis: Secondary | ICD-10-CM | POA: Diagnosis not present

## 2019-07-31 ENCOUNTER — Telehealth (HOSPITAL_COMMUNITY): Payer: Self-pay

## 2019-07-31 ENCOUNTER — Other Ambulatory Visit: Payer: Self-pay

## 2019-07-31 DIAGNOSIS — Z992 Dependence on renal dialysis: Secondary | ICD-10-CM | POA: Diagnosis not present

## 2019-07-31 DIAGNOSIS — N186 End stage renal disease: Secondary | ICD-10-CM

## 2019-07-31 DIAGNOSIS — D689 Coagulation defect, unspecified: Secondary | ICD-10-CM | POA: Diagnosis not present

## 2019-07-31 DIAGNOSIS — D631 Anemia in chronic kidney disease: Secondary | ICD-10-CM | POA: Diagnosis not present

## 2019-07-31 DIAGNOSIS — N2581 Secondary hyperparathyroidism of renal origin: Secondary | ICD-10-CM | POA: Diagnosis not present

## 2019-07-31 DIAGNOSIS — E1129 Type 2 diabetes mellitus with other diabetic kidney complication: Secondary | ICD-10-CM | POA: Diagnosis not present

## 2019-07-31 DIAGNOSIS — D509 Iron deficiency anemia, unspecified: Secondary | ICD-10-CM | POA: Diagnosis not present

## 2019-07-31 NOTE — Telephone Encounter (Signed)

## 2019-08-01 ENCOUNTER — Encounter (HOSPITAL_COMMUNITY): Payer: Medicare Other

## 2019-08-01 NOTE — Progress Notes (Deleted)
POST OPERATIVE OFFICE NOTE    CC:  F/u for surgery  HPI:  This is a 54 y.o. male who is s/p left BC AVF on 06/20/2019 by Dr. Scot Dock.  On POD 1, pt was doing well and had brisk biphasic doppler signals in his hand.  It was felt he may need superficialization of the fistula in the future. The pt returns today for follow up.   ***  The pt *** have evidence of steal sx. The pt *** on dialysis.  Allergies  Allergen Reactions  . Bee Venom Anaphylaxis, Shortness Of Breath and Swelling  . Other Shortness Of Breath    Itching, rash with IVP DYE, iodine, shellfish LATEX  . Shellfish Allergy Nausea And Vomiting and Other (See Comments)    Feels like insides are twisting  . Iodinated Diagnostic Agents     Other reaction(s): RASH  . Iohexol      Code: RASH, Desc: PT WAS ON PREDNISONE FOR GOUT TX. @ TIME OF SCAN AND RECEIVED 50 MG OF BENADRYL IV-ARS 10/08/07   . Iodine Rash  . Latex Rash    Current Outpatient Medications  Medication Sig Dispense Refill  . albuterol (VENTOLIN HFA) 108 (90 Base) MCG/ACT inhaler TAKE 2 PUFFS BY MOUTH EVERY 6 HOURS AS NEEDED FOR WHEEZE OR SHORTNESS OF BREATH 18 g 2  . busPIRone (BUSPAR) 15 MG tablet TAKE 1 TABLET (15 MG TOTAL) BY MOUTH 2 (TWO) TIMES DAILY. 180 tablet 0  . calcium acetate (PHOSLO) 667 MG capsule Take 2 capsules (1,334 mg total) by mouth 3 (three) times daily with meals. 180 capsule 0  . cetirizine (ZYRTEC) 10 MG tablet Take 1 tablet (10 mg total) by mouth daily. 30 tablet 11  . clomiPHENE (CLOMID) 50 MG tablet Take 0.5 tablets (25 mg total) by mouth every other day. 24 tablet 3  . diphenhydrAMINE (BENADRYL) 50 MG tablet Take 1 tablet (50 mg total) by mouth once for 1 dose. Take 1 hr before CT/injection 1 tablet 0  . DULoxetine (CYMBALTA) 60 MG capsule TAKE 1 CAPSULE BY MOUTH EVERY DAY 90 capsule 0  . EMGALITY 120 MG/ML SOAJ Inject 120 mg as directed every 30 (thirty) days.     Marland Kitchen EPIPEN 2-PAK 0.3 MG/0.3ML SOAJ injection INJECT 0.3 MLS (0.3 MG  TOTAL) INTO THE MUSCLE ONCE. AS NEEDED FOR ANAPHYLACTIC REACTION (Patient taking differently: Inject 0.3 mg into the muscle as needed. ) 2 Device 2  . esomeprazole (NEXIUM) 40 MG capsule TAKE 1 CAPSULE (40 MG TOTAL) BY MOUTH DAILY AT 12 NOON. 90 capsule 0  . fentaNYL (DURAGESIC) 50 MCG/HR Place 1 patch onto the skin every 3 (three) days.    . fluticasone (FLONASE) 50 MCG/ACT nasal spray PLACE 1 SPRAY INTO BOTH NOSTRILS 2 (TWO) TIMES DAILY AS NEEDED FOR ALLERGIES. 48 g 4  . HYDROcodone-acetaminophen (NORCO) 7.5-325 MG tablet Take 1 tablet by mouth every 6 (six) hours as needed for moderate pain. 90 tablet 0  . Insulin Glargine, 1 Unit Dial, (TOUJEO SOLOSTAR) 300 UNIT/ML SOPN Inject 45 Units into the skin 2 (two) times daily.    Marland Kitchen lamoTRIgine (LAMICTAL) 200 MG tablet TAKE 1 TABLET BY MOUTH TWICE A DAY 180 tablet 0  . levETIRAcetam (KEPPRA) 500 MG tablet Take 1 tablet (500 mg total) by mouth 2 (two) times daily. 60 tablet 0  . NARCAN 4 MG/0.1ML LIQD nasal spray kit Place 1 spray into the nose once.     . polyethylene glycol (MIRALAX / GLYCOLAX) 17 g packet Take  17 g by mouth 2 (two) times daily. 30 each 0  . predniSONE (DELTASONE) 50 MG tablet 1 tab po 13,7, 1 hr before CT/injection 3 tablet 0  . pregabalin (LYRICA) 50 MG capsule Take 1 capsule (50 mg total) by mouth 2 (two) times daily.    . rizatriptan (MAXALT) 10 MG tablet Take 1 tablet (10 mg total) by mouth as needed. May repeat in 2 hours if needed    . senna (SENOKOT) 8.6 MG TABS tablet Take 1 tablet (8.6 mg total) by mouth at bedtime. 30 tablet 0  . tamsulosin (FLOMAX) 0.4 MG CAPS capsule TAKE 1 CAPSULE BY MOUTH EVERY DAY 90 capsule 0  . TRULICITY 1.5 AT/5.5DD SOPN Inject 1.5 mg as directed once a week.     . vitamin C (VITAMIN C) 500 MG tablet Take 1 tablet (500 mg total) by mouth daily.    . Vitamin D, Ergocalciferol, (DRISDOL) 1.25 MG (50000 UT) CAPS capsule Take 50,000 Units by mouth once a week.    . warfarin (COUMADIN) 3 MG tablet Take 1  tablet (3 mg total) by mouth daily. 30 tablet 2  . zinc sulfate 220 (50 Zn) MG capsule Take 1 capsule (220 mg total) by mouth 2 (two) times daily.     No current facility-administered medications for this visit.     ROS:  See HPI  Physical Exam:  ***  Incision:  *** Extremities:  There *** a palpable *** pulse.  Motor and sensory *** in tact.  There *** a thrill/bruit present.   Dialysis Duplex on ***: Diameter:  *** Depth:  ***   Assessment/Plan:  This is a 54 y.o. male who is s/p:  left BC AVF on 06/20/2019 by Dr. Scot Dock  -the pt does *** have evidence of steal. -the fistula/graft can be used ***. -If pt has tunneled dialysis catheter and the access has been used successfully to the satisfaction of the dialysis center, the tunneled catheter can be removed at their discretion.   -the pt will follow up ***   Leontine Locket, PA-C [facility] [phone]  Clinic MD:  Early

## 2019-08-02 ENCOUNTER — Ambulatory Visit: Payer: Self-pay | Admitting: Family Medicine

## 2019-08-02 ENCOUNTER — Telehealth: Payer: Self-pay | Admitting: *Deleted

## 2019-08-02 ENCOUNTER — Encounter (HOSPITAL_COMMUNITY): Payer: Medicare Other

## 2019-08-02 DIAGNOSIS — D631 Anemia in chronic kidney disease: Secondary | ICD-10-CM | POA: Diagnosis not present

## 2019-08-02 DIAGNOSIS — N186 End stage renal disease: Secondary | ICD-10-CM | POA: Diagnosis not present

## 2019-08-02 DIAGNOSIS — E1129 Type 2 diabetes mellitus with other diabetic kidney complication: Secondary | ICD-10-CM | POA: Diagnosis not present

## 2019-08-02 DIAGNOSIS — N2581 Secondary hyperparathyroidism of renal origin: Secondary | ICD-10-CM | POA: Diagnosis not present

## 2019-08-02 DIAGNOSIS — J810 Acute pulmonary edema: Secondary | ICD-10-CM

## 2019-08-02 DIAGNOSIS — D509 Iron deficiency anemia, unspecified: Secondary | ICD-10-CM | POA: Diagnosis not present

## 2019-08-02 DIAGNOSIS — D689 Coagulation defect, unspecified: Secondary | ICD-10-CM | POA: Diagnosis not present

## 2019-08-02 DIAGNOSIS — Z86718 Personal history of other venous thrombosis and embolism: Secondary | ICD-10-CM

## 2019-08-02 DIAGNOSIS — Z992 Dependence on renal dialysis: Secondary | ICD-10-CM | POA: Diagnosis not present

## 2019-08-02 LAB — POCT INR: INR: 1.4 — AB (ref 2.0–3.0)

## 2019-08-02 NOTE — Telephone Encounter (Signed)
Fax received [facility] PT/INR self testing service Test date/time 08/01/19 7:31 pm INR 1.4

## 2019-08-02 NOTE — Telephone Encounter (Signed)
Wife aware

## 2019-08-02 NOTE — Telephone Encounter (Signed)
Change Sunday and Thursday dose to 4.5 mg and continue 3 mg all other days. Recheck INR in one week.   INR today was 1.4, goal 2.0-3.0.

## 2019-08-02 NOTE — Progress Notes (Signed)
Change Sunday and Thursday dose to 4.5 mg and continue 3 mg all other days. Recheck INR in one week.   INR today was 1.4, goal 2.0-3.0.

## 2019-08-04 DIAGNOSIS — D509 Iron deficiency anemia, unspecified: Secondary | ICD-10-CM | POA: Diagnosis not present

## 2019-08-04 DIAGNOSIS — M19012 Primary osteoarthritis, left shoulder: Secondary | ICD-10-CM | POA: Diagnosis not present

## 2019-08-04 DIAGNOSIS — D689 Coagulation defect, unspecified: Secondary | ICD-10-CM | POA: Diagnosis not present

## 2019-08-04 DIAGNOSIS — D631 Anemia in chronic kidney disease: Secondary | ICD-10-CM | POA: Diagnosis not present

## 2019-08-04 DIAGNOSIS — E1129 Type 2 diabetes mellitus with other diabetic kidney complication: Secondary | ICD-10-CM | POA: Diagnosis not present

## 2019-08-04 DIAGNOSIS — M19011 Primary osteoarthritis, right shoulder: Secondary | ICD-10-CM | POA: Diagnosis not present

## 2019-08-04 DIAGNOSIS — N2581 Secondary hyperparathyroidism of renal origin: Secondary | ICD-10-CM | POA: Diagnosis not present

## 2019-08-04 DIAGNOSIS — Z992 Dependence on renal dialysis: Secondary | ICD-10-CM | POA: Diagnosis not present

## 2019-08-04 DIAGNOSIS — N186 End stage renal disease: Secondary | ICD-10-CM | POA: Diagnosis not present

## 2019-08-07 ENCOUNTER — Other Ambulatory Visit: Payer: Self-pay

## 2019-08-07 DIAGNOSIS — E1129 Type 2 diabetes mellitus with other diabetic kidney complication: Secondary | ICD-10-CM | POA: Diagnosis not present

## 2019-08-07 DIAGNOSIS — N2581 Secondary hyperparathyroidism of renal origin: Secondary | ICD-10-CM | POA: Diagnosis not present

## 2019-08-07 DIAGNOSIS — D689 Coagulation defect, unspecified: Secondary | ICD-10-CM | POA: Diagnosis not present

## 2019-08-07 DIAGNOSIS — D631 Anemia in chronic kidney disease: Secondary | ICD-10-CM | POA: Diagnosis not present

## 2019-08-07 DIAGNOSIS — N186 End stage renal disease: Secondary | ICD-10-CM | POA: Diagnosis not present

## 2019-08-07 DIAGNOSIS — Z992 Dependence on renal dialysis: Secondary | ICD-10-CM | POA: Diagnosis not present

## 2019-08-07 DIAGNOSIS — D509 Iron deficiency anemia, unspecified: Secondary | ICD-10-CM | POA: Diagnosis not present

## 2019-08-08 ENCOUNTER — Ambulatory Visit: Payer: Medicare Other | Admitting: Family

## 2019-08-08 ENCOUNTER — Telehealth: Payer: Self-pay | Admitting: *Deleted

## 2019-08-08 DIAGNOSIS — J810 Acute pulmonary edema: Secondary | ICD-10-CM

## 2019-08-08 DIAGNOSIS — Z86718 Personal history of other venous thrombosis and embolism: Secondary | ICD-10-CM

## 2019-08-08 NOTE — Telephone Encounter (Signed)
  INR today was 1.7 (too thick), goal 2.0-3.0.   Today take 4.5 mg (08/08/19) then Increase to 4.5 mg on  Sunday, Tuesday and Thursday dose to 4.5 mg and continue 3 mg all other days. Recheck INR in one week.

## 2019-08-08 NOTE — Telephone Encounter (Signed)
Patient aware of coumadin dosing.

## 2019-08-08 NOTE — Telephone Encounter (Signed)
Fax received [facility] PT/INR self testing service Test date/time 08/08/19 8:16 am INR 1.7

## 2019-08-09 ENCOUNTER — Telehealth (HOSPITAL_COMMUNITY): Payer: Self-pay

## 2019-08-09 DIAGNOSIS — N2581 Secondary hyperparathyroidism of renal origin: Secondary | ICD-10-CM | POA: Diagnosis not present

## 2019-08-09 DIAGNOSIS — D631 Anemia in chronic kidney disease: Secondary | ICD-10-CM | POA: Diagnosis not present

## 2019-08-09 DIAGNOSIS — Z992 Dependence on renal dialysis: Secondary | ICD-10-CM | POA: Diagnosis not present

## 2019-08-09 DIAGNOSIS — E1129 Type 2 diabetes mellitus with other diabetic kidney complication: Secondary | ICD-10-CM | POA: Diagnosis not present

## 2019-08-09 DIAGNOSIS — D689 Coagulation defect, unspecified: Secondary | ICD-10-CM | POA: Diagnosis not present

## 2019-08-09 DIAGNOSIS — D509 Iron deficiency anemia, unspecified: Secondary | ICD-10-CM | POA: Diagnosis not present

## 2019-08-09 DIAGNOSIS — N186 End stage renal disease: Secondary | ICD-10-CM | POA: Diagnosis not present

## 2019-08-09 NOTE — Telephone Encounter (Signed)

## 2019-08-10 ENCOUNTER — Other Ambulatory Visit: Payer: Self-pay

## 2019-08-10 ENCOUNTER — Ambulatory Visit (HOSPITAL_COMMUNITY)
Admission: RE | Admit: 2019-08-10 | Discharge: 2019-08-10 | Disposition: A | Discharge status: Home / Self Care | Payer: Medicare Other | Source: Ambulatory Visit | Attending: Vascular Surgery | Admitting: Vascular Surgery

## 2019-08-10 ENCOUNTER — Ambulatory Visit (INDEPENDENT_AMBULATORY_CARE_PROVIDER_SITE_OTHER): Payer: Self-pay | Admitting: Physician Assistant

## 2019-08-10 VITALS — BP 81/44 | HR 68 | Temp 97.7°F | Ht 75.0 in | Wt >= 6400 oz

## 2019-08-10 DIAGNOSIS — N186 End stage renal disease: Secondary | ICD-10-CM

## 2019-08-10 NOTE — Progress Notes (Signed)
    Postoperative Access Visit   History of Present Illness   [name] is a 54 y.o. year old male who presents for postoperative follow-up for: left brachiocephalic arteriovenous fistula (Date: 06/19/19) by Dr. Scot Dock.  The patient's wounds are well healed.  The patient notes no steal symptoms.  The patient is able to complete their activities of daily living.    Physical Examination   Vitals:   08/10/19 1106  BP: (!) 81/44  Pulse: 68  Temp: 97.7 F (36.5 C)  TempSrc: Temporal  SpO2: 98%  Weight: (!) 406 lb (184.2 kg)  Height: 6\' 3"  (1.905 m)   Body mass index is 50.75 kg/m.   Morbidly obese male. In no acute distress. On oxygen. In a motorized wheel chair. Lungs clear to auscultation bilaterally. No carotid bruits. Heart regular rate and rhythm.  left arm Incision is intact, healed. No erythema swelling or ecchymosis. 2+ radial pulse, hand grip is 5/5, sensation in digits is intact, good palpable thrill, bruit can be auscultated. Deep in left upper arm. Left hand warm      Medical Decision Making   [name] is a 54 y.o. year old male who presents s/p left brachiocephalic arteriovenous fistula   Patent is without signs or symptoms of steal syndrome  Patient currently dialyses via [facility] M[facility]  Left brachiocephalic arteriovenous fistula is of adequate diameter but is too deep for access. He will need superficialization of the left BC fistula  2-3 week follow up after surgery to check incisions  Karoline Caldwell, PA-C [facility] [place] Office: [phone]  Clinic MD: Dr. Scot Dock

## 2019-08-11 DIAGNOSIS — E1129 Type 2 diabetes mellitus with other diabetic kidney complication: Secondary | ICD-10-CM | POA: Diagnosis not present

## 2019-08-11 DIAGNOSIS — D509 Iron deficiency anemia, unspecified: Secondary | ICD-10-CM | POA: Diagnosis not present

## 2019-08-11 DIAGNOSIS — N186 End stage renal disease: Secondary | ICD-10-CM | POA: Diagnosis not present

## 2019-08-11 DIAGNOSIS — N2581 Secondary hyperparathyroidism of renal origin: Secondary | ICD-10-CM | POA: Diagnosis not present

## 2019-08-11 DIAGNOSIS — D689 Coagulation defect, unspecified: Secondary | ICD-10-CM | POA: Diagnosis not present

## 2019-08-11 DIAGNOSIS — D631 Anemia in chronic kidney disease: Secondary | ICD-10-CM | POA: Diagnosis not present

## 2019-08-11 DIAGNOSIS — Z992 Dependence on renal dialysis: Secondary | ICD-10-CM | POA: Diagnosis not present

## 2019-08-14 DIAGNOSIS — N2581 Secondary hyperparathyroidism of renal origin: Secondary | ICD-10-CM | POA: Diagnosis not present

## 2019-08-14 DIAGNOSIS — G4733 Obstructive sleep apnea (adult) (pediatric): Secondary | ICD-10-CM | POA: Diagnosis not present

## 2019-08-14 DIAGNOSIS — D631 Anemia in chronic kidney disease: Secondary | ICD-10-CM | POA: Diagnosis not present

## 2019-08-14 DIAGNOSIS — G8194 Hemiplegia, unspecified affecting left nondominant side: Secondary | ICD-10-CM | POA: Diagnosis not present

## 2019-08-14 DIAGNOSIS — N186 End stage renal disease: Secondary | ICD-10-CM | POA: Diagnosis not present

## 2019-08-14 DIAGNOSIS — D689 Coagulation defect, unspecified: Secondary | ICD-10-CM | POA: Diagnosis not present

## 2019-08-14 DIAGNOSIS — D509 Iron deficiency anemia, unspecified: Secondary | ICD-10-CM | POA: Diagnosis not present

## 2019-08-14 DIAGNOSIS — J449 Chronic obstructive pulmonary disease, unspecified: Secondary | ICD-10-CM | POA: Diagnosis not present

## 2019-08-14 DIAGNOSIS — E1129 Type 2 diabetes mellitus with other diabetic kidney complication: Secondary | ICD-10-CM | POA: Diagnosis not present

## 2019-08-14 DIAGNOSIS — Z992 Dependence on renal dialysis: Secondary | ICD-10-CM | POA: Diagnosis not present

## 2019-08-16 DIAGNOSIS — D631 Anemia in chronic kidney disease: Secondary | ICD-10-CM | POA: Diagnosis not present

## 2019-08-16 DIAGNOSIS — E1129 Type 2 diabetes mellitus with other diabetic kidney complication: Secondary | ICD-10-CM | POA: Diagnosis not present

## 2019-08-16 DIAGNOSIS — D689 Coagulation defect, unspecified: Secondary | ICD-10-CM | POA: Diagnosis not present

## 2019-08-16 DIAGNOSIS — D509 Iron deficiency anemia, unspecified: Secondary | ICD-10-CM | POA: Diagnosis not present

## 2019-08-16 DIAGNOSIS — N2581 Secondary hyperparathyroidism of renal origin: Secondary | ICD-10-CM | POA: Diagnosis not present

## 2019-08-16 DIAGNOSIS — Z992 Dependence on renal dialysis: Secondary | ICD-10-CM | POA: Diagnosis not present

## 2019-08-16 DIAGNOSIS — N186 End stage renal disease: Secondary | ICD-10-CM | POA: Diagnosis not present

## 2019-08-17 DIAGNOSIS — N281 Cyst of kidney, acquired: Secondary | ICD-10-CM | POA: Diagnosis not present

## 2019-08-17 DIAGNOSIS — M1990 Unspecified osteoarthritis, unspecified site: Secondary | ICD-10-CM | POA: Diagnosis not present

## 2019-08-17 DIAGNOSIS — Z992 Dependence on renal dialysis: Secondary | ICD-10-CM | POA: Diagnosis not present

## 2019-08-17 DIAGNOSIS — Z466 Encounter for fitting and adjustment of urinary device: Secondary | ICD-10-CM | POA: Diagnosis not present

## 2019-08-17 DIAGNOSIS — E1122 Type 2 diabetes mellitus with diabetic chronic kidney disease: Secondary | ICD-10-CM | POA: Diagnosis not present

## 2019-08-17 DIAGNOSIS — J9611 Chronic respiratory failure with hypoxia: Secondary | ICD-10-CM | POA: Diagnosis not present

## 2019-08-17 DIAGNOSIS — K219 Gastro-esophageal reflux disease without esophagitis: Secondary | ICD-10-CM | POA: Diagnosis not present

## 2019-08-17 DIAGNOSIS — R569 Unspecified convulsions: Secondary | ICD-10-CM | POA: Diagnosis not present

## 2019-08-17 DIAGNOSIS — Z7901 Long term (current) use of anticoagulants: Secondary | ICD-10-CM | POA: Diagnosis not present

## 2019-08-17 DIAGNOSIS — Z87440 Personal history of urinary (tract) infections: Secondary | ICD-10-CM | POA: Diagnosis not present

## 2019-08-17 DIAGNOSIS — Z86711 Personal history of pulmonary embolism: Secondary | ICD-10-CM | POA: Diagnosis not present

## 2019-08-17 DIAGNOSIS — Z9981 Dependence on supplemental oxygen: Secondary | ICD-10-CM | POA: Diagnosis not present

## 2019-08-17 DIAGNOSIS — N186 End stage renal disease: Secondary | ICD-10-CM | POA: Diagnosis not present

## 2019-08-17 DIAGNOSIS — Z86718 Personal history of other venous thrombosis and embolism: Secondary | ICD-10-CM | POA: Diagnosis not present

## 2019-08-17 DIAGNOSIS — Z8616 Personal history of COVID-19: Secondary | ICD-10-CM | POA: Diagnosis not present

## 2019-08-17 DIAGNOSIS — Z9181 History of falling: Secondary | ICD-10-CM | POA: Diagnosis not present

## 2019-08-17 DIAGNOSIS — G4733 Obstructive sleep apnea (adult) (pediatric): Secondary | ICD-10-CM | POA: Diagnosis not present

## 2019-08-18 DIAGNOSIS — Z992 Dependence on renal dialysis: Secondary | ICD-10-CM | POA: Diagnosis not present

## 2019-08-18 DIAGNOSIS — D631 Anemia in chronic kidney disease: Secondary | ICD-10-CM | POA: Diagnosis not present

## 2019-08-18 DIAGNOSIS — E1129 Type 2 diabetes mellitus with other diabetic kidney complication: Secondary | ICD-10-CM | POA: Diagnosis not present

## 2019-08-18 DIAGNOSIS — N2581 Secondary hyperparathyroidism of renal origin: Secondary | ICD-10-CM | POA: Diagnosis not present

## 2019-08-18 DIAGNOSIS — D689 Coagulation defect, unspecified: Secondary | ICD-10-CM | POA: Diagnosis not present

## 2019-08-18 DIAGNOSIS — D509 Iron deficiency anemia, unspecified: Secondary | ICD-10-CM | POA: Diagnosis not present

## 2019-08-18 DIAGNOSIS — N186 End stage renal disease: Secondary | ICD-10-CM | POA: Diagnosis not present

## 2019-08-21 ENCOUNTER — Other Ambulatory Visit: Payer: Self-pay

## 2019-08-21 ENCOUNTER — Other Ambulatory Visit: Payer: Self-pay | Admitting: Family

## 2019-08-21 DIAGNOSIS — J810 Acute pulmonary edema: Secondary | ICD-10-CM

## 2019-08-21 DIAGNOSIS — Z992 Dependence on renal dialysis: Secondary | ICD-10-CM | POA: Diagnosis not present

## 2019-08-21 DIAGNOSIS — D509 Iron deficiency anemia, unspecified: Secondary | ICD-10-CM | POA: Diagnosis not present

## 2019-08-21 DIAGNOSIS — Z86718 Personal history of other venous thrombosis and embolism: Secondary | ICD-10-CM

## 2019-08-21 DIAGNOSIS — D631 Anemia in chronic kidney disease: Secondary | ICD-10-CM | POA: Diagnosis not present

## 2019-08-21 DIAGNOSIS — D689 Coagulation defect, unspecified: Secondary | ICD-10-CM | POA: Diagnosis not present

## 2019-08-21 DIAGNOSIS — N186 End stage renal disease: Secondary | ICD-10-CM | POA: Diagnosis not present

## 2019-08-21 DIAGNOSIS — N2581 Secondary hyperparathyroidism of renal origin: Secondary | ICD-10-CM | POA: Diagnosis not present

## 2019-08-21 DIAGNOSIS — E1129 Type 2 diabetes mellitus with other diabetic kidney complication: Secondary | ICD-10-CM | POA: Diagnosis not present

## 2019-08-22 ENCOUNTER — Ambulatory Visit (INDEPENDENT_AMBULATORY_CARE_PROVIDER_SITE_OTHER): Payer: Medicare Other | Admitting: Family

## 2019-08-22 ENCOUNTER — Telehealth: Payer: Self-pay | Admitting: *Deleted

## 2019-08-22 ENCOUNTER — Encounter: Payer: Self-pay | Admitting: Family

## 2019-08-22 VITALS — BP 89/55 | HR 88 | Temp 98.5°F | Ht 75.0 in

## 2019-08-22 DIAGNOSIS — J810 Acute pulmonary edema: Secondary | ICD-10-CM

## 2019-08-22 DIAGNOSIS — E1165 Type 2 diabetes mellitus with hyperglycemia: Secondary | ICD-10-CM | POA: Diagnosis not present

## 2019-08-22 DIAGNOSIS — G8929 Other chronic pain: Secondary | ICD-10-CM

## 2019-08-22 DIAGNOSIS — G894 Chronic pain syndrome: Secondary | ICD-10-CM

## 2019-08-22 DIAGNOSIS — Z86718 Personal history of other venous thrombosis and embolism: Secondary | ICD-10-CM

## 2019-08-22 DIAGNOSIS — E559 Vitamin D deficiency, unspecified: Secondary | ICD-10-CM

## 2019-08-22 DIAGNOSIS — Z7901 Long term (current) use of anticoagulants: Secondary | ICD-10-CM

## 2019-08-22 DIAGNOSIS — N186 End stage renal disease: Secondary | ICD-10-CM

## 2019-08-22 DIAGNOSIS — M545 Low back pain, unspecified: Secondary | ICD-10-CM

## 2019-08-22 DIAGNOSIS — G40909 Epilepsy, unspecified, not intractable, without status epilepticus: Secondary | ICD-10-CM

## 2019-08-22 DIAGNOSIS — IMO0002 Reserved for concepts with insufficient information to code with codable children: Secondary | ICD-10-CM

## 2019-08-22 DIAGNOSIS — N185 Chronic kidney disease, stage 5: Secondary | ICD-10-CM

## 2019-08-22 DIAGNOSIS — K219 Gastro-esophageal reflux disease without esophagitis: Secondary | ICD-10-CM

## 2019-08-22 DIAGNOSIS — Z0289 Encounter for other administrative examinations: Secondary | ICD-10-CM

## 2019-08-22 DIAGNOSIS — E662 Morbid (severe) obesity with alveolar hypoventilation: Secondary | ICD-10-CM

## 2019-08-22 DIAGNOSIS — F1120 Opioid dependence, uncomplicated: Secondary | ICD-10-CM

## 2019-08-22 DIAGNOSIS — G43711 Chronic migraine without aura, intractable, with status migrainosus: Secondary | ICD-10-CM | POA: Diagnosis not present

## 2019-08-22 DIAGNOSIS — F411 Generalized anxiety disorder: Secondary | ICD-10-CM

## 2019-08-22 DIAGNOSIS — F339 Major depressive disorder, recurrent, unspecified: Secondary | ICD-10-CM

## 2019-08-22 DIAGNOSIS — E785 Hyperlipidemia, unspecified: Secondary | ICD-10-CM

## 2019-08-22 DIAGNOSIS — G4733 Obstructive sleep apnea (adult) (pediatric): Secondary | ICD-10-CM

## 2019-08-22 DIAGNOSIS — D696 Thrombocytopenia, unspecified: Secondary | ICD-10-CM

## 2019-08-22 DIAGNOSIS — Z86711 Personal history of pulmonary embolism: Secondary | ICD-10-CM

## 2019-08-22 DIAGNOSIS — E1169 Type 2 diabetes mellitus with other specified complication: Secondary | ICD-10-CM

## 2019-08-22 NOTE — Progress Notes (Addendum)
Subjective:    Patient ID: [name], male    DOB: 1965/10/25, 54 y.o.   MRN: [id number]  Chief Complaint  Patient presents with  . Medical Management of Chronic Issues    6 MTHinr AT HOME 1.7   Pt presents to the office today for chronic follow up. Pt is followed by neurologistsannuallyfor migraines and seizures.PT is followed by Ortho for osteoarthritisevery 3 months. Oncologists for Thrombocytopenia and lymphadenopathy.  Followed by Nephrologists and currently getting  Dialysis M,W, F.. He has a foley. Followed by the  Pain Clinic every month for chronic back pain. He had his A1C drawn 08/16/19 and it was 5.7%!  He has home health coming out once a month to change foley. He has Palliative nurse once a month.    He takes warfarin for hx of DVT and PE. See anticoagulation flow sheet.  Diabetes He presents for his follow-up diabetic visit. He has type 2 diabetes mellitus. His disease course has been stable. Hypoglycemia symptoms include nervousness/anxiousness. Associated symptoms include blurred vision and foot paresthesias. Pertinent negatives for diabetes include no visual change. There are no hypoglycemic complications. Symptoms are stable. Diabetic complications include heart disease, nephropathy and peripheral neuropathy. Pertinent negatives for diabetic complications include no CVA. Risk factors for coronary artery disease include dyslipidemia, diabetes mellitus, hypertension, male sex, post-menopausal and sedentary lifestyle. He is following a generally unhealthy diet. His overall blood glucose range is 110-130 mg/dl.  Gastroesophageal Reflux He complains of belching and heartburn. This is a chronic problem. The current episode started more than 1 year ago. The problem occurs occasionally. The problem has been waxing and waning. The symptoms are aggravated by certain foods. Risk factors include obesity. He has tried a PPI for the symptoms. The treatment provided moderate  relief.  Hyperlipidemia This is a chronic problem. The current episode started more than 1 year ago. The problem is controlled. Recent lipid tests were reviewed and are normal. Exacerbating diseases include obesity. Associated symptoms include leg pain. Current antihyperlipidemic treatment includes statins. The current treatment provides moderate improvement of lipids. Risk factors for coronary artery disease include dyslipidemia, diabetes mellitus, male sex, hypertension and a sedentary lifestyle.  Anxiety Presents for follow-up visit. Symptoms include decreased concentration, excessive worry and nervous/anxious behavior. Patient reports no insomnia, irritability or restlessness. Symptoms occur occasionally. The severity of symptoms is moderate.    Depression        This is a chronic problem.  The current episode started more than 1 year ago.   The onset quality is gradual.   The problem occurs intermittently.  Associated symptoms include decreased concentration, helplessness, hopelessness, irritable and sad.  Associated symptoms include does not have insomnia and no restlessness.     The symptoms are aggravated by family issues.  Past treatments include SNRIs - Serotonin and norepinephrine reuptake inhibitors.  Compliance with treatment is good.  Past medical history includes anxiety.   Back Pain This is a chronic problem. The current episode started more than 1 year ago. The problem occurs intermittently. The problem has been waxing and waning since onset. The pain is present in the lumbar spine. The quality of the pain is described as aching. The pain is at a severity of 5/10. The pain is moderate. Associated symptoms include leg pain. Pertinent negatives include no bladder incontinence. He has tried analgesics for the symptoms. The treatment provided mild relief.   OSA  Does not use CPAP.    Review of Systems  Constitutional: Negative  for irritability.  Eyes: Positive for blurred vision.    Gastrointestinal: Positive for heartburn.  Genitourinary: Negative for bladder incontinence.  Musculoskeletal: Positive for back pain.  Psychiatric/Behavioral: Positive for decreased concentration and depression. The patient is nervous/anxious. The patient does not have insomnia.   All other systems reviewed and are negative.      Objective:   Physical Exam Vitals reviewed.  Constitutional:      General: He is irritable. He is not in acute distress.    Appearance: Normal appearance. He is well-developed. He is obese.  HENT:     Head: Normocephalic.     Right Ear: Tympanic membrane normal.     Left Ear: Tympanic membrane normal.  Eyes:     General:        Right eye: No discharge.        Left eye: No discharge.     Pupils: Pupils are equal, round, and reactive to light.  Neck:     Thyroid: No thyromegaly.  Cardiovascular:     Rate and Rhythm: Normal rate and regular rhythm.     Heart sounds: Normal heart sounds. No murmur.  Pulmonary:     Effort: Pulmonary effort is normal. No respiratory distress.     Breath sounds: Normal breath sounds. No wheezing.  Abdominal:     General: Bowel sounds are normal. There is no distension.     Palpations: Abdomen is soft.     Tenderness: There is no abdominal tenderness.  Musculoskeletal:        General: No tenderness. Normal range of motion.     Cervical back: Normal range of motion and neck supple.     Right lower leg: Edema (trace) present.     Left lower leg: Edema (trace) present.  Skin:    General: Skin is warm and dry.     Coloration: Skin is pale.     Findings: No erythema or rash.  Neurological:     Mental Status: He is alert and oriented to person, place, and time.     Cranial Nerves: No cranial nerve deficit.     Deep Tendon Reflexes: Reflexes are normal and symmetric.  Psychiatric:        Behavior: Behavior normal. Behavior is cooperative.        Thought Content: Thought content normal.        Judgment: Judgment normal.       BP 90/60   Pulse (!) 160   Temp 98.5 F (36.9 C) (Temporal)   Ht 6\' 3"  (1.905 m)   SpO2 (!) 79%   BMI 50.75 kg/m       Assessment & Plan:  [name] comes in today with chief complaint of Medical Management of Chronic Issues (6 MTHinr AT HOME 1.7)   Diagnosis and orders addressed:  1. Gastroesophageal reflux disease, unspecified whether esophagitis present  2. Chronic migraine without aura, intractable, with status migrainosus  3. Obesity hypoventilation syndrome ([place])  4. Diabetes mellitus type 2, uncontrolled, with complications ([place])  5. Hyperlipidemia associated with type 2 diabetes mellitus ([place])  6. Seizure disorder ([place]  7. CKD (chronic kidney disease), stage V ([place])  8. ESRD (end stage renal disease) ([place])   9. Chronic anticoagulation   10. Chronic bilateral low back pain, unspecified whether sciatica present  11. Chronic pain syndrome   12. Episode of recurrent major depressive disorder, unspecified depression episode severity ([place])  13. History of DVT (deep vein thrombosis)  14. History of pulmonary embolism  15. History of pulmonary embolus (PE)  16. Morbid obesity ([place]) -PT is morbid obese and is wheelchair/bed bound. He is at high risk for pressure ulcer. Would benefit from bariatric gel overlay mattress pad  17. GAD (generalized anxiety disorder)   18. Thrombocytopenia ([place])  19. Pain medication agreement signed   20. Uncomplicated opioid dependence ([place])   21. Vitamin D deficiency   22. Postoperative pulmonary edema (HCC)   23. OSA (obstructive sleep apnea)    Labs reviewed Health Maintenance reviewed Diet and exercise encouraged  Follow up plan: 6 months and keep specialists appts   Evelina Dun, FNP

## 2019-08-22 NOTE — Telephone Encounter (Signed)
Fax received mdINR PT/INR self testing service Test date/time 08/22/19 9:12 am INR 1.7

## 2019-08-22 NOTE — Patient Instructions (Signed)
Description   INR today was 1.7  (too thick) , goal 2.0-3.0.  (pt had only taken 3 mg every day this last week because there was a confusion with his dosage, we will restart at that current dose)   Continue current dose of  4.5 mg on  Sunday, Tuesday and Thursday  and 3 mg all other days. Recheck INR in two weeks.

## 2019-08-23 DIAGNOSIS — D509 Iron deficiency anemia, unspecified: Secondary | ICD-10-CM | POA: Diagnosis not present

## 2019-08-23 DIAGNOSIS — E1129 Type 2 diabetes mellitus with other diabetic kidney complication: Secondary | ICD-10-CM | POA: Diagnosis not present

## 2019-08-23 DIAGNOSIS — I279 Pulmonary heart disease, unspecified: Secondary | ICD-10-CM | POA: Diagnosis not present

## 2019-08-23 DIAGNOSIS — J449 Chronic obstructive pulmonary disease, unspecified: Secondary | ICD-10-CM | POA: Diagnosis not present

## 2019-08-23 DIAGNOSIS — G8194 Hemiplegia, unspecified affecting left nondominant side: Secondary | ICD-10-CM | POA: Diagnosis not present

## 2019-08-23 DIAGNOSIS — Z992 Dependence on renal dialysis: Secondary | ICD-10-CM | POA: Diagnosis not present

## 2019-08-23 DIAGNOSIS — D631 Anemia in chronic kidney disease: Secondary | ICD-10-CM | POA: Diagnosis not present

## 2019-08-23 DIAGNOSIS — N186 End stage renal disease: Secondary | ICD-10-CM | POA: Diagnosis not present

## 2019-08-23 DIAGNOSIS — N2581 Secondary hyperparathyroidism of renal origin: Secondary | ICD-10-CM | POA: Diagnosis not present

## 2019-08-23 DIAGNOSIS — D689 Coagulation defect, unspecified: Secondary | ICD-10-CM | POA: Diagnosis not present

## 2019-08-25 DIAGNOSIS — D631 Anemia in chronic kidney disease: Secondary | ICD-10-CM | POA: Diagnosis not present

## 2019-08-25 DIAGNOSIS — Z992 Dependence on renal dialysis: Secondary | ICD-10-CM | POA: Diagnosis not present

## 2019-08-25 DIAGNOSIS — D509 Iron deficiency anemia, unspecified: Secondary | ICD-10-CM | POA: Diagnosis not present

## 2019-08-25 DIAGNOSIS — E1129 Type 2 diabetes mellitus with other diabetic kidney complication: Secondary | ICD-10-CM | POA: Diagnosis not present

## 2019-08-25 DIAGNOSIS — D689 Coagulation defect, unspecified: Secondary | ICD-10-CM | POA: Diagnosis not present

## 2019-08-25 DIAGNOSIS — N186 End stage renal disease: Secondary | ICD-10-CM | POA: Diagnosis not present

## 2019-08-25 DIAGNOSIS — N2581 Secondary hyperparathyroidism of renal origin: Secondary | ICD-10-CM | POA: Diagnosis not present

## 2019-08-27 DIAGNOSIS — N186 End stage renal disease: Secondary | ICD-10-CM | POA: Diagnosis not present

## 2019-08-27 DIAGNOSIS — N12 Tubulo-interstitial nephritis, not specified as acute or chronic: Secondary | ICD-10-CM | POA: Diagnosis not present

## 2019-08-27 DIAGNOSIS — J9611 Chronic respiratory failure with hypoxia: Secondary | ICD-10-CM | POA: Diagnosis not present

## 2019-08-27 DIAGNOSIS — Z992 Dependence on renal dialysis: Secondary | ICD-10-CM | POA: Diagnosis not present

## 2019-08-27 DIAGNOSIS — E1122 Type 2 diabetes mellitus with diabetic chronic kidney disease: Secondary | ICD-10-CM | POA: Diagnosis not present

## 2019-08-28 DIAGNOSIS — E1129 Type 2 diabetes mellitus with other diabetic kidney complication: Secondary | ICD-10-CM | POA: Diagnosis not present

## 2019-08-28 DIAGNOSIS — D509 Iron deficiency anemia, unspecified: Secondary | ICD-10-CM | POA: Diagnosis not present

## 2019-08-28 DIAGNOSIS — D689 Coagulation defect, unspecified: Secondary | ICD-10-CM | POA: Diagnosis not present

## 2019-08-28 DIAGNOSIS — N2581 Secondary hyperparathyroidism of renal origin: Secondary | ICD-10-CM | POA: Diagnosis not present

## 2019-08-28 DIAGNOSIS — Z992 Dependence on renal dialysis: Secondary | ICD-10-CM | POA: Diagnosis not present

## 2019-08-28 DIAGNOSIS — D631 Anemia in chronic kidney disease: Secondary | ICD-10-CM | POA: Diagnosis not present

## 2019-08-28 DIAGNOSIS — N186 End stage renal disease: Secondary | ICD-10-CM | POA: Diagnosis not present

## 2019-08-29 DIAGNOSIS — M549 Dorsalgia, unspecified: Secondary | ICD-10-CM | POA: Diagnosis not present

## 2019-08-29 DIAGNOSIS — E559 Vitamin D deficiency, unspecified: Secondary | ICD-10-CM | POA: Diagnosis not present

## 2019-08-29 DIAGNOSIS — Z79899 Other long term (current) drug therapy: Secondary | ICD-10-CM | POA: Diagnosis not present

## 2019-08-29 DIAGNOSIS — G894 Chronic pain syndrome: Secondary | ICD-10-CM | POA: Diagnosis not present

## 2019-08-30 ENCOUNTER — Telehealth: Payer: Self-pay | Admitting: Family

## 2019-08-30 ENCOUNTER — Other Ambulatory Visit: Payer: Medicare Other | Admitting: Internal Medicine

## 2019-08-30 DIAGNOSIS — M545 Low back pain, unspecified: Secondary | ICD-10-CM

## 2019-08-30 DIAGNOSIS — N186 End stage renal disease: Secondary | ICD-10-CM | POA: Diagnosis not present

## 2019-08-30 DIAGNOSIS — N2581 Secondary hyperparathyroidism of renal origin: Secondary | ICD-10-CM | POA: Diagnosis not present

## 2019-08-30 DIAGNOSIS — D509 Iron deficiency anemia, unspecified: Secondary | ICD-10-CM | POA: Diagnosis not present

## 2019-08-30 DIAGNOSIS — G8929 Other chronic pain: Secondary | ICD-10-CM

## 2019-08-30 DIAGNOSIS — D631 Anemia in chronic kidney disease: Secondary | ICD-10-CM | POA: Diagnosis not present

## 2019-08-30 DIAGNOSIS — Z992 Dependence on renal dialysis: Secondary | ICD-10-CM | POA: Diagnosis not present

## 2019-08-30 DIAGNOSIS — G894 Chronic pain syndrome: Secondary | ICD-10-CM

## 2019-08-30 DIAGNOSIS — D689 Coagulation defect, unspecified: Secondary | ICD-10-CM | POA: Diagnosis not present

## 2019-08-30 DIAGNOSIS — E1129 Type 2 diabetes mellitus with other diabetic kidney complication: Secondary | ICD-10-CM | POA: Diagnosis not present

## 2019-08-30 NOTE — Telephone Encounter (Signed)
Pts wife called stating that pt needs a new mattress for his bed. Says she spoke with [facility] and was told that pt needed an order for bariatric gel overlay mattress.

## 2019-08-31 ENCOUNTER — Other Ambulatory Visit: Payer: Self-pay | Admitting: Family Medicine

## 2019-08-31 ENCOUNTER — Encounter: Payer: Self-pay | Admitting: Internal Medicine

## 2019-08-31 ENCOUNTER — Other Ambulatory Visit: Payer: Medicare Other | Admitting: Internal Medicine

## 2019-08-31 ENCOUNTER — Other Ambulatory Visit: Payer: Self-pay

## 2019-08-31 DIAGNOSIS — Z7189 Other specified counseling: Secondary | ICD-10-CM

## 2019-08-31 DIAGNOSIS — E662 Morbid (severe) obesity with alveolar hypoventilation: Secondary | ICD-10-CM

## 2019-08-31 DIAGNOSIS — Z515 Encounter for palliative care: Secondary | ICD-10-CM | POA: Diagnosis not present

## 2019-08-31 MED ORDER — MATTRESS COVER MISC: 0 refills | Status: AC

## 2019-08-31 NOTE — Progress Notes (Signed)
March 4th, 2021 [facility] Palliative Care Consult Note Telephone: [phone]  Fax: [phone]   PATIENT NAME: [name] DOB: May 11, 1966 MRN: [id number] [place] (12 miles 20 min)   PRIMARY CARE PROVIDER:   Sharion Balloon, FNP Dr Mar Daring (Urology    REFERRING PROVIDER:  Sharion Balloon, [address],  [place] [address]   Dialysis at [facility] in [place] PT/OT/SN  visit  Ponca RN, Embedded Chronic Care Manager [facility], [phone]    RESPONSIBLE PARTY: (spouse) Stryker Shellhammer (hospital x-ray tech) [phone] [email or web address]  ASSESSMENT / RECOMMENDATIONS:  1. Advance Care Planning: A. Directives: Would desire full resuscitative efforts in the event of a cardiopulmonary arrest.  B. Goals of Care: Eventually wants to be able to stand and pivot to chair. Last able to do so was October. Was working with PT but discontinued services as he was so booked up with doctor visits and dialysis runs that he didn't have time to schedule with them. Also, he is hoping to loose more water weight before tucking back into a physical therapy routine.  - I encouraged him to continue doing the bed exercises, on a daily basis, previously prescribed by PT. Best not to procrastinate. Exercise will ultimately be his ticked to getting out of bed and in the [place].    2. Symptom Management: A. Pain: Continues over all arthritic pain concentrated in joints and lower back, now in buttocks and sacral area d/t pressure on his bottom, when in bed/chair. [facility] dose his dose about 4 months earlier. He has Hydrocodone Acetaminophen 7.5-325mg. He had increased his use to 3-4 tabs/day.   B. Unfortunately, has developed a stage 2 pressure  injury on R buttocks about the size of a 50 cent piece (Estill Bambergshowed me a pic she had taken). No signs of inflammation or infection. He has been treating with vitamin Q & D ointment.    -Suggested use of PolyMem adhesive wound dressing 4 X 4 (can order through [employer]. I reviewed application / changing instructions with AEstill Bamberg -Discussed regularly turning off of that Buttocks area (turns q2h) to decrease pressure. We discussed the etiology behind these pressure injuries.  -Mattress gel pad overlay was ordered by PCP   3. Cognitive / Functional status: (from previous note; reviewed and updated)Patient is A & O x 3. Has an UE resting tremor which waxes and wanes in intensity. He takes Keppra for h/o seizures; none for years. H/O mood disorder well managed on current regimen. He is bed bound; electric bed. Hoyer lift to transfer to wheelchair. Dependent for assist with ADLs; able to feed himself. Transports to hemodialysis via handicap vLucianne Leidriven by spouse. Height is 6'3" His current weight is 398, a loss of 15 lbs over these last 2 months. Pt states he has lost about 60 lbs of fluid since initiation of dialysis. He can see this loss as his fingers are no longer so puffy. Also, there is decreased weeping of a site on his left side that was bothering him before. He mentions that he is having 10 lbs of fluid removed each dialysis treatment. He feels he has about 60 lbs to go. His L arm fistula needs to undergo surfacing, which he has scheduled in the near future. At a height  of 6'3" his BMI is 49.7kg/m2. He continues oxygen 3-4 LPM; sats maintaining in the upper 90's. -discussed he can titrate down his oxygen as long as he maintains sats > 94%. That the dyspnea he feels with exertion may be more d/t deconditioning than to hypoxia.    4. Family and Community Supports/Coping setting of serious illness: Now with [facility], rather than [facility]. They change out his Foley monthly. 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  Married 7 years to extremely supportive spouse, [name]. Patient has a son age 34 (previous marriage).  Patient in good spirits with positive outlook. Feels so much better since initiation of dialysis "feel like I'm back from the dead".    5. Follow up Palliative Care Visit: Thurs 10/12/2019 @ 3pm. Patient requests monthly visits.  I spent 60 minutes providing this consultation from 3pm to 4pm. More than 50% of the time in this consultation was spent coordinating communication.    HISTORY OF PRESENT ILLNESS:  [name] is a 54 year old male with history of chronic kidney disease stage V (hemodialysis), chronic hypoxic respiratory failure on 3 L [place], OSA, OHS, diabetes mellitus type 2, DVT/PE (coumadin), hemorrhagic R kidney cyst (CT scan; conservative management finasteride/Foley), anemia of chronic disease/acute blood loss, mood disorder/bipolar (BuSpar, Cymbalta, Lamictal; psych f/u), seizure d/o (Keppra), chronic pain syndrome, morbid obesity, debility (WC bound baseline), mild confusion (sedative medications). 10/11-10/27/2020 hospitalized with COVID pneumonia. 11/10-11/16/2020 hospitalization [id number]/08/[id number]/6754 UTI complicated pyelonephritis/gross hematuria/inability to void/pain, acute on chronic CKD creatinine of 5.42.   12/13-07/03/2019 hospitalization. Initiated hemodialysis (06/15/2019). Tx'd 5 units PRBC. 49/20/10 L brachiocephalic arteriovenous fistula (Dr. Scot Dock). On 09/12/2019 Scheduled for FISTULA SUPERFICIALIZATION ON SECOND STAGE LEFT BRACHIOCEPHALIC ARTERIOVENOUS FISTULA   This is a f/u Palliative Care Visit from 07/18/2019.    CODE STATUS: Full Code   PPS: 30%   HOSPICE ELIGIBILITY/DIAGNOSIS: TBD  PAST MEDICAL HISTORY:  Past Medical History:  Diagnosis Date  . Anemia   . Anxiety   . Arthritis   . Bipolar 1 disorder ([place])   . Bronchitis    hx of  . Bruises easily   . Chronic kidney disease    decreased  left kidney fx  . Chronic pain syndrome 05/11/2012  . Chronic respiratory failure with hypoxia (HCC)    And with hypercapnia  . Diabetes mellitus without complication ([place])   . Diabetic neuropathy ([place])   . Dialysis patient ([place])   . DVT (deep venous thrombosis) (HCC)    LLE DVT ~ '12  . Dyspnea    with ambulation  . GERD (gastroesophageal reflux disease)   . History of 2019 novel coronavirus disease (COVID-19)   . HOH (hard of hearing) 2015   has hearing aids  . Mental disorder   . Migraine   . Neuromuscular disorder ([place])   . Obesity hypoventilation syndrome ([place])   . Obstructive sleep apnea    CPAP  . PE (pulmonary embolism)    bilateral PE ~ '11  . Seizures ([place])   . Thrombocytopenia ([place]) 05/11/2012    SOCIAL HX:  Social History   Tobacco Use  . Smoking status: Never Smoker  . Smokeless tobacco: Never Used  Substance Use Topics  . Alcohol use: No    ALLERGIES:  Allergies  Allergen Reactions  . Bee Venom Anaphylaxis, Shortness Of Breath and Swelling  . Other Shortness Of Breath    Itching, rash with IVP DYE, iodine, shellfish LATEX  . Shellfish Allergy Nausea  And Vomiting and Other (See Comments)    Feels like insides are twisting  . Iodinated Diagnostic Agents     Other reaction(s): RASH  . Iohexol      Code: RASH, Desc: PT WAS ON PREDNISONE FOR GOUT TX. @ TIME OF SCAN AND RECEIVED 50 MG OF BENADRYL IV-ARS 10/08/07   . Iodine Rash  . Latex Rash     PERTINENT MEDICATIONS:  Outpatient Encounter Medications as of 08/31/2019  Medication Sig  . albuterol (VENTOLIN HFA) 108 (90 Base) MCG/ACT inhaler TAKE 2 PUFFS BY MOUTH EVERY 6 HOURS AS NEEDED FOR WHEEZE OR SHORTNESS OF BREATH  . busPIRone (BUSPAR) 15 MG tablet TAKE 1 TABLET (15 MG TOTAL) BY MOUTH 2 (TWO) TIMES DAILY.  . calcium acetate (PHOSLO) 667 MG capsule Take 2 capsules (1,334 mg total) by mouth 3 (three) times daily with meals.  . cetirizine (ZYRTEC) 10 MG tablet Take 1 tablet (10 mg total) by mouth  daily.  . clomiPHENE (CLOMID) 50 MG tablet Take 0.5 tablets (25 mg total) by mouth every other day.  . DULoxetine (CYMBALTA) 60 MG capsule TAKE 1 CAPSULE BY MOUTH EVERY DAY  . EMGALITY 120 MG/ML SOAJ Inject 120 mg as directed every 30 (thirty) days.   Marland Kitchen esomeprazole (NEXIUM) 40 MG capsule TAKE 1 CAPSULE (40 MG TOTAL) BY MOUTH DAILY AT 12 NOON.  . fentaNYL (DURAGESIC) 50 MCG/HR Place 1 patch onto the skin every 3 (three) days.  . fluticasone (FLONASE) 50 MCG/ACT nasal spray PLACE 1 SPRAY INTO BOTH NOSTRILS 2 (TWO) TIMES DAILY AS NEEDED FOR ALLERGIES.  Marland Kitchen HYDROcodone-acetaminophen (NORCO) 7.5-325 MG tablet Take 1 tablet by mouth every 6 (six) hours as needed for moderate pain.  . Insulin Glargine, 1 Unit Dial, (TOUJEO SOLOSTAR) 300 UNIT/ML SOPN Inject 45 Units into the skin 2 (two) times daily.  Marland Kitchen lamoTRIgine (LAMICTAL) 200 MG tablet TAKE 1 TABLET BY MOUTH TWICE A DAY  . levETIRAcetam (KEPPRA) 500 MG tablet Take 1 tablet (500 mg total) by mouth 2 (two) times daily.  . midodrine (PROAMATINE) 10 MG tablet Take 10 mg by mouth 3 (three) times daily.  . Misc. Devices (MATTRESS COVER) MISC bariatric gel overlay mattress  . NARCAN 4 MG/0.1ML LIQD nasal spray kit Place 1 spray into the nose once.   . polyethylene glycol (MIRALAX / GLYCOLAX) 17 g packet Take 17 g by mouth 2 (two) times daily.  . pregabalin (LYRICA) 50 MG capsule Take 1 capsule (50 mg total) by mouth 2 (two) times daily.  Marland Kitchen senna (SENOKOT) 8.6 MG TABS tablet Take 1 tablet (8.6 mg total) by mouth at bedtime.  . tamsulosin (FLOMAX) 0.4 MG CAPS capsule TAKE 1 CAPSULE BY MOUTH EVERY DAY  . vitamin C (VITAMIN C) 500 MG tablet Take 1 tablet (500 mg total) by mouth daily.  . Vitamin D, Ergocalciferol, (DRISDOL) 1.25 MG (50000 UT) CAPS capsule Take 50,000 Units by mouth once a week.  . warfarin (COUMADIN) 3 MG tablet Take 1 tablet (3 mg total) by mouth daily.  Marland Kitchen zinc sulfate 220 (50 Zn) MG capsule Take 1 capsule (220 mg total) by mouth 2 (two) times  daily.  Marland Kitchen EPIPEN 2-PAK 0.3 MG/0.3ML SOAJ injection INJECT 0.3 MLS (0.3 MG TOTAL) INTO THE MUSCLE ONCE. AS NEEDED FOR ANAPHYLACTIC REACTION (Patient not taking: No sig reported)  . rizatriptan (MAXALT) 10 MG tablet Take 1 tablet (10 mg total) by mouth as needed. May repeat in 2 hours if needed (Patient not taking: Reported on 08/31/2019)  . [DISCONTINUED]  potassium chloride (K-DUR) 10 MEQ tablet Take 3 tablets (30 mEq total) by mouth daily.  . [DISCONTINUED] TRULICITY 1.5 PI/0.9PU SOPN Inject 1.5 mg as directed once a week.    No facility-administered encounter medications on file as of 08/31/2019.    PHYSICAL EXAM:   Morbidly obese, pleasantly conversant middle-aged gentleman lying supine in hospital bed. Spouse [name] is in attendance.  PE deferred to decreased risk of COVID exposure. Neurological: Weakness but otherwise non-focal  Julianne Handler, NP

## 2019-08-31 NOTE — Telephone Encounter (Signed)
Prescription sent to pharmacy.

## 2019-09-01 DIAGNOSIS — N186 End stage renal disease: Secondary | ICD-10-CM | POA: Diagnosis not present

## 2019-09-01 DIAGNOSIS — D509 Iron deficiency anemia, unspecified: Secondary | ICD-10-CM | POA: Diagnosis not present

## 2019-09-01 DIAGNOSIS — N2581 Secondary hyperparathyroidism of renal origin: Secondary | ICD-10-CM | POA: Diagnosis not present

## 2019-09-01 DIAGNOSIS — D631 Anemia in chronic kidney disease: Secondary | ICD-10-CM | POA: Diagnosis not present

## 2019-09-01 DIAGNOSIS — Z992 Dependence on renal dialysis: Secondary | ICD-10-CM | POA: Diagnosis not present

## 2019-09-01 DIAGNOSIS — E1129 Type 2 diabetes mellitus with other diabetic kidney complication: Secondary | ICD-10-CM | POA: Diagnosis not present

## 2019-09-01 DIAGNOSIS — D689 Coagulation defect, unspecified: Secondary | ICD-10-CM | POA: Diagnosis not present

## 2019-09-04 DIAGNOSIS — D689 Coagulation defect, unspecified: Secondary | ICD-10-CM | POA: Diagnosis not present

## 2019-09-04 DIAGNOSIS — D509 Iron deficiency anemia, unspecified: Secondary | ICD-10-CM | POA: Diagnosis not present

## 2019-09-04 DIAGNOSIS — N2581 Secondary hyperparathyroidism of renal origin: Secondary | ICD-10-CM | POA: Diagnosis not present

## 2019-09-04 DIAGNOSIS — N186 End stage renal disease: Secondary | ICD-10-CM | POA: Diagnosis not present

## 2019-09-04 DIAGNOSIS — E1129 Type 2 diabetes mellitus with other diabetic kidney complication: Secondary | ICD-10-CM | POA: Diagnosis not present

## 2019-09-04 DIAGNOSIS — D631 Anemia in chronic kidney disease: Secondary | ICD-10-CM | POA: Diagnosis not present

## 2019-09-04 DIAGNOSIS — Z992 Dependence on renal dialysis: Secondary | ICD-10-CM | POA: Diagnosis not present

## 2019-09-06 DIAGNOSIS — N2581 Secondary hyperparathyroidism of renal origin: Secondary | ICD-10-CM | POA: Diagnosis not present

## 2019-09-06 DIAGNOSIS — E1129 Type 2 diabetes mellitus with other diabetic kidney complication: Secondary | ICD-10-CM | POA: Diagnosis not present

## 2019-09-06 DIAGNOSIS — D689 Coagulation defect, unspecified: Secondary | ICD-10-CM | POA: Diagnosis not present

## 2019-09-06 DIAGNOSIS — N186 End stage renal disease: Secondary | ICD-10-CM | POA: Diagnosis not present

## 2019-09-06 DIAGNOSIS — D631 Anemia in chronic kidney disease: Secondary | ICD-10-CM | POA: Diagnosis not present

## 2019-09-06 DIAGNOSIS — D509 Iron deficiency anemia, unspecified: Secondary | ICD-10-CM | POA: Diagnosis not present

## 2019-09-06 DIAGNOSIS — Z992 Dependence on renal dialysis: Secondary | ICD-10-CM | POA: Diagnosis not present

## 2019-09-07 ENCOUNTER — Ambulatory Visit (INDEPENDENT_AMBULATORY_CARE_PROVIDER_SITE_OTHER): Payer: Medicare Other | Admitting: Neurology

## 2019-09-07 ENCOUNTER — Other Ambulatory Visit: Payer: Self-pay

## 2019-09-07 ENCOUNTER — Encounter: Payer: Self-pay | Admitting: Neurology

## 2019-09-07 VITALS — Temp 97.7°F | Ht 75.0 in | Wt 397.0 lb

## 2019-09-07 DIAGNOSIS — R251 Tremor, unspecified: Secondary | ICD-10-CM

## 2019-09-07 DIAGNOSIS — R278 Other lack of coordination: Secondary | ICD-10-CM

## 2019-09-07 NOTE — Patient Instructions (Signed)
Occupational therapy for weighted silverware and other compensatory devices for tremors
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
## 2019-09-08 ENCOUNTER — Encounter (HOSPITAL_COMMUNITY): Payer: Self-pay | Admitting: Vascular Surgery

## 2019-09-08 DIAGNOSIS — N186 End stage renal disease: Secondary | ICD-10-CM | POA: Diagnosis not present

## 2019-09-08 DIAGNOSIS — D689 Coagulation defect, unspecified: Secondary | ICD-10-CM | POA: Diagnosis not present

## 2019-09-08 DIAGNOSIS — D631 Anemia in chronic kidney disease: Secondary | ICD-10-CM | POA: Diagnosis not present

## 2019-09-08 DIAGNOSIS — D509 Iron deficiency anemia, unspecified: Secondary | ICD-10-CM | POA: Diagnosis not present

## 2019-09-08 DIAGNOSIS — E1129 Type 2 diabetes mellitus with other diabetic kidney complication: Secondary | ICD-10-CM | POA: Diagnosis not present

## 2019-09-08 DIAGNOSIS — Z992 Dependence on renal dialysis: Secondary | ICD-10-CM | POA: Diagnosis not present

## 2019-09-08 DIAGNOSIS — N2581 Secondary hyperparathyroidism of renal origin: Secondary | ICD-10-CM | POA: Diagnosis not present

## 2019-09-08 LAB — HEPATIC FUNCTION PANEL
ALT: 6 IU/L (ref 0–44)
AST: 18 IU/L (ref 0–40)
Albumin: 3.1 g/dL — ABNORMAL LOW (ref 3.8–4.9)
Alkaline Phosphatase: 109 IU/L (ref 39–117)
Bilirubin Total: 0.5 mg/dL (ref 0.0–1.2)
Bilirubin, Direct: 0.27 mg/dL (ref 0.00–0.40)
Total Protein: 7.2 g/dL (ref 6.0–8.5)

## 2019-09-08 NOTE — Progress Notes (Addendum)
[name] denies chest pain or shortness of breath.  Mr [name] had Covid in Oct 2020 and was hospitalized. Patient will have Covid test 33/15/2021. Mr [name] does not walk or stand, he travels via wheelchair, he is lifted via hoyer, patient reported that his lift works well.  [name] has type II diabetes, patient reports that CBGs run 150 - 180. Mrs [name] reported that patient's last A1C was 5.7- drawn at dialysis 1 month ago. I instructed patient to takes 1/2 of Toujeo the Monday hs and if CBG is more than than 70 to take 25  units of Toujeo th morning of surgery and if CBG is greater than 220 take 1/2 dose of S/S Insulin. I also instructed patient that if CBG is less than 70 to hold Toujeo and to take 4 glucose tablets and recheck in 15 minutes/   Mr [name] has tested postive for Sleep Apnea in the past and was treated with oxygen @ bedtime. Mr [name] reports that he does not have sleep apnea and he wears 4 liters of oxygen - 24/7; "thanks to Covid."

## 2019-09-11 ENCOUNTER — Other Ambulatory Visit (HOSPITAL_COMMUNITY)
Admission: RE | Admit: 2019-09-11 | Discharge: 2019-09-11 | Disposition: A | Discharge status: Home / Self Care | Payer: Medicare Other | Source: Ambulatory Visit | Attending: Vascular Surgery | Admitting: Vascular Surgery

## 2019-09-11 DIAGNOSIS — Z20822 Contact with and (suspected) exposure to covid-19: Secondary | ICD-10-CM | POA: Insufficient documentation

## 2019-09-11 DIAGNOSIS — Z992 Dependence on renal dialysis: Secondary | ICD-10-CM | POA: Diagnosis not present

## 2019-09-11 DIAGNOSIS — J449 Chronic obstructive pulmonary disease, unspecified: Secondary | ICD-10-CM | POA: Diagnosis not present

## 2019-09-11 DIAGNOSIS — N186 End stage renal disease: Secondary | ICD-10-CM | POA: Diagnosis not present

## 2019-09-11 DIAGNOSIS — D689 Coagulation defect, unspecified: Secondary | ICD-10-CM | POA: Diagnosis not present

## 2019-09-11 DIAGNOSIS — D509 Iron deficiency anemia, unspecified: Secondary | ICD-10-CM | POA: Diagnosis not present

## 2019-09-11 DIAGNOSIS — N2581 Secondary hyperparathyroidism of renal origin: Secondary | ICD-10-CM | POA: Diagnosis not present

## 2019-09-11 DIAGNOSIS — E1129 Type 2 diabetes mellitus with other diabetic kidney complication: Secondary | ICD-10-CM | POA: Diagnosis not present

## 2019-09-11 DIAGNOSIS — D631 Anemia in chronic kidney disease: Secondary | ICD-10-CM | POA: Diagnosis not present

## 2019-09-11 DIAGNOSIS — Z01812 Encounter for preprocedural laboratory examination: Secondary | ICD-10-CM | POA: Diagnosis not present

## 2019-09-11 DIAGNOSIS — G4733 Obstructive sleep apnea (adult) (pediatric): Secondary | ICD-10-CM | POA: Diagnosis not present

## 2019-09-11 DIAGNOSIS — G8194 Hemiplegia, unspecified affecting left nondominant side: Secondary | ICD-10-CM | POA: Diagnosis not present

## 2019-09-11 LAB — SARS CORONAVIRUS 2 (TAT 6-24 HRS): SARS Coronavirus 2: NEGATIVE

## 2019-09-11 MED ORDER — DEXTROSE 5 % IV SOLN
3.0000 g | INTRAVENOUS | Status: AC
  Administered 2019-09-12: 3 g via INTRAVENOUS
  Filled 2019-09-11: qty 3

## 2019-09-11 NOTE — Anesthesia Preprocedure Evaluation (Addendum)
Anesthesia Evaluation  Patient identified by MRN, date of birth, ID band Patient awake    Reviewed: Allergy & Precautions, NPO status , Patient's Chart, lab work & pertinent test results  Airway Mallampati: I  TM Distance: >3 FB Neck ROM: Full    Dental  (+) Dental Advisory Given, Edentulous Lower, Edentulous Upper   Pulmonary sleep apnea ,    Pulmonary exam normal breath sounds clear to auscultation       Cardiovascular negative cardio ROS Normal cardiovascular exam Rhythm:Regular Rate:Normal     Neuro/Psych  Headaches, Seizures -,  PSYCHIATRIC DISORDERS Anxiety Depression Bipolar Disorder  Neuromuscular disease    GI/Hepatic Neg liver ROS, GERD  ,  Endo/Other  diabetesMorbid obesity  Renal/GU ESRF and DialysisRenal disease (MWF)     Musculoskeletal  (+) Arthritis ,   Abdominal   Peds  Hematology negative hematology ROS (+)   Anesthesia Other Findings Day of surgery medications reviewed with the patient.  Reproductive/Obstetrics                            Anesthesia Physical Anesthesia Plan  ASA: IV  Anesthesia Plan: General   Post-op Pain Management:    Induction: Intravenous  PONV Risk Score and Plan: 2 and Dexamethasone and Ondansetron  Airway Management Planned: LMA  Additional Equipment:   Intra-op Plan:   Post-operative Plan: Extubation in OR  Informed Consent: I have reviewed the patients History and Physical, chart, labs and discussed the procedure including the risks, benefits and alternatives for the proposed anesthesia with the patient or authorized representative who has indicated his/her understanding and acceptance.     Dental advisory given  Plan Discussed with: CRNA  Anesthesia Plan Comments:      Anesthesia Quick Evaluation

## 2019-09-12 ENCOUNTER — Telehealth: Payer: Self-pay | Admitting: Physician Assistant

## 2019-09-12 ENCOUNTER — Ambulatory Visit (HOSPITAL_COMMUNITY): Payer: Medicare Other | Admitting: Anesthesiology

## 2019-09-12 ENCOUNTER — Ambulatory Visit (HOSPITAL_COMMUNITY)
Admission: RE | Admit: 2019-09-12 | Discharge: 2019-09-12 | Disposition: A | Discharge status: Home / Self Care | Payer: Medicare Other | Attending: Vascular Surgery | Admitting: Vascular Surgery

## 2019-09-12 ENCOUNTER — Other Ambulatory Visit: Payer: Self-pay

## 2019-09-12 ENCOUNTER — Encounter (HOSPITAL_COMMUNITY): Payer: Self-pay | Admitting: Vascular Surgery

## 2019-09-12 ENCOUNTER — Encounter (HOSPITAL_COMMUNITY)
Admission: RE | Disposition: A | Discharge status: Home / Self Care | Payer: Self-pay | Source: Home / Self Care | Attending: Vascular Surgery

## 2019-09-12 DIAGNOSIS — E1140 Type 2 diabetes mellitus with diabetic neuropathy, unspecified: Secondary | ICD-10-CM | POA: Diagnosis not present

## 2019-09-12 DIAGNOSIS — F319 Bipolar disorder, unspecified: Secondary | ICD-10-CM | POA: Insufficient documentation

## 2019-09-12 DIAGNOSIS — R569 Unspecified convulsions: Secondary | ICD-10-CM | POA: Diagnosis not present

## 2019-09-12 DIAGNOSIS — E1122 Type 2 diabetes mellitus with diabetic chronic kidney disease: Secondary | ICD-10-CM | POA: Diagnosis not present

## 2019-09-12 DIAGNOSIS — Z992 Dependence on renal dialysis: Secondary | ICD-10-CM | POA: Insufficient documentation

## 2019-09-12 DIAGNOSIS — K219 Gastro-esophageal reflux disease without esophagitis: Secondary | ICD-10-CM | POA: Diagnosis not present

## 2019-09-12 DIAGNOSIS — E669 Obesity, unspecified: Secondary | ICD-10-CM | POA: Insufficient documentation

## 2019-09-12 DIAGNOSIS — Z8616 Personal history of COVID-19: Secondary | ICD-10-CM | POA: Insufficient documentation

## 2019-09-12 DIAGNOSIS — Z86718 Personal history of other venous thrombosis and embolism: Secondary | ICD-10-CM | POA: Diagnosis not present

## 2019-09-12 DIAGNOSIS — N186 End stage renal disease: Secondary | ICD-10-CM | POA: Diagnosis not present

## 2019-09-12 DIAGNOSIS — Z888 Allergy status to other drugs, medicaments and biological substances status: Secondary | ICD-10-CM | POA: Diagnosis not present

## 2019-09-12 DIAGNOSIS — Z86711 Personal history of pulmonary embolism: Secondary | ICD-10-CM | POA: Insufficient documentation

## 2019-09-12 DIAGNOSIS — F419 Anxiety disorder, unspecified: Secondary | ICD-10-CM | POA: Insufficient documentation

## 2019-09-12 DIAGNOSIS — Z6841 Body Mass Index (BMI) 40.0 and over, adult: Secondary | ICD-10-CM | POA: Insufficient documentation

## 2019-09-12 DIAGNOSIS — T82898A Other specified complication of vascular prosthetic devices, implants and grafts, initial encounter: Secondary | ICD-10-CM | POA: Diagnosis not present

## 2019-09-12 DIAGNOSIS — G894 Chronic pain syndrome: Secondary | ICD-10-CM | POA: Insufficient documentation

## 2019-09-12 DIAGNOSIS — J810 Acute pulmonary edema: Secondary | ICD-10-CM

## 2019-09-12 HISTORY — DX: End stage renal disease: N18.6

## 2019-09-12 HISTORY — PX: FISTULA SUPERFICIALIZATION: SHX6341

## 2019-09-12 HISTORY — DX: Constipation, unspecified: K59.00

## 2019-09-12 LAB — GLUCOSE, CAPILLARY
Glucose-Capillary: 178 mg/dL — ABNORMAL HIGH (ref 70–99)
Glucose-Capillary: 167 mg/dL — ABNORMAL HIGH (ref 70–99)

## 2019-09-12 LAB — POCT I-STAT, CHEM 8
BUN: 29 mg/dL — ABNORMAL HIGH (ref 6–20)
Calcium, Ion: 1.08 mmol/L — ABNORMAL LOW (ref 1.15–1.40)
Chloride: 94 mmol/L — ABNORMAL LOW (ref 98–111)
Creatinine, Ser: 4.6 mg/dL — ABNORMAL HIGH (ref 0.61–1.24)
Glucose, Bld: 200 mg/dL — ABNORMAL HIGH (ref 70–99)
HCT: 28 % — ABNORMAL LOW (ref 39.0–52.0)
Hemoglobin: 9.5 g/dL — ABNORMAL LOW (ref 13.0–17.0)
Potassium: 3.6 mmol/L (ref 3.5–5.1)
Sodium: 135 mmol/L (ref 135–145)
TCO2: 31 mmol/L (ref 22–32)

## 2019-09-12 LAB — PROTIME-INR
INR: 1.3 — ABNORMAL HIGH (ref 0.8–1.2)
Prothrombin Time: 16.2 seconds — ABNORMAL HIGH (ref 11.4–15.2)

## 2019-09-12 SURGERY — FISTULA SUPERFICIALIZATION
Anesthesia: General | Site: Arm Upper | Laterality: Left

## 2019-09-12 MED ORDER — EPHEDRINE 5 MG/ML INJ
INTRAVENOUS | Status: AC
  Filled 2019-09-12: qty 10

## 2019-09-12 MED ORDER — ONDANSETRON HCL 4 MG/2ML IJ SOLN
INTRAMUSCULAR | Status: AC
  Filled 2019-09-12: qty 2

## 2019-09-12 MED ORDER — DEXAMETHASONE SODIUM PHOSPHATE 10 MG/ML IJ SOLN
INTRAMUSCULAR | Status: DC | PRN
  Administered 2019-09-12: 5 mg via INTRAVENOUS

## 2019-09-12 MED ORDER — FENTANYL CITRATE (PF) 250 MCG/5ML IJ SOLN
INTRAMUSCULAR | Status: AC
  Filled 2019-09-12: qty 5

## 2019-09-12 MED ORDER — ONDANSETRON HCL 4 MG/2ML IJ SOLN
INTRAMUSCULAR | Status: DC | PRN
  Administered 2019-09-12: 4 mg via INTRAVENOUS

## 2019-09-12 MED ORDER — PROPOFOL 10 MG/ML IV BOLUS
INTRAVENOUS | Status: DC | PRN
  Administered 2019-09-12: 100 mg via INTRAVENOUS

## 2019-09-12 MED ORDER — CHLORHEXIDINE GLUCONATE 4 % EX LIQD: 60.0000 mL | Freq: Once | CUTANEOUS | Status: DC

## 2019-09-12 MED ORDER — PHENYLEPHRINE HCL-NACL 10-0.9 MG/250ML-% IV SOLN
INTRAVENOUS | Status: DC | PRN
  Administered 2019-09-12: 45 ug/min via INTRAVENOUS

## 2019-09-12 MED ORDER — MIDAZOLAM HCL 2 MG/2ML IJ SOLN
INTRAMUSCULAR | Status: DC | PRN
  Administered 2019-09-12: 2 mg via INTRAVENOUS

## 2019-09-12 MED ORDER — SODIUM CHLORIDE 0.9 % IV SOLN: INTRAVENOUS | Status: DC

## 2019-09-12 MED ORDER — ALBUMIN HUMAN 5 % IV SOLN: INTRAVENOUS | Status: DC | PRN

## 2019-09-12 MED ORDER — LIDOCAINE HCL (PF) 1 % IJ SOLN
INTRAMUSCULAR | Status: AC
  Filled 2019-09-12: qty 30

## 2019-09-12 MED ORDER — VASOPRESSIN 20 UNIT/ML IV SOLN
INTRAVENOUS | Status: DC | PRN
  Administered 2019-09-12 (×2): 2 [IU] via INTRAVENOUS

## 2019-09-12 MED ORDER — DEXAMETHASONE SODIUM PHOSPHATE 10 MG/ML IJ SOLN
INTRAMUSCULAR | Status: AC
  Filled 2019-09-12: qty 1

## 2019-09-12 MED ORDER — LIDOCAINE 2% (20 MG/ML) 5 ML SYRINGE
INTRAMUSCULAR | Status: AC
  Filled 2019-09-12: qty 5

## 2019-09-12 MED ORDER — EPHEDRINE SULFATE-NACL 50-0.9 MG/10ML-% IV SOSY
PREFILLED_SYRINGE | INTRAVENOUS | Status: DC | PRN
  Administered 2019-09-12: 5 mg via INTRAVENOUS
  Administered 2019-09-12 (×2): 10 mg via INTRAVENOUS

## 2019-09-12 MED ORDER — PROPOFOL 10 MG/ML IV BOLUS
INTRAVENOUS | Status: AC
  Filled 2019-09-12: qty 20

## 2019-09-12 MED ORDER — FENTANYL CITRATE (PF) 250 MCG/5ML IJ SOLN: INTRAMUSCULAR | Status: DC | PRN

## 2019-09-12 MED ORDER — 0.9 % SODIUM CHLORIDE (POUR BTL) OPTIME
TOPICAL | Status: DC | PRN
  Administered 2019-09-12: 1000 mL

## 2019-09-12 MED ORDER — MIDAZOLAM HCL 2 MG/2ML IJ SOLN
INTRAMUSCULAR | Status: AC
  Filled 2019-09-12: qty 2

## 2019-09-12 MED ORDER — LIDOCAINE-EPINEPHRINE 1 %-1:100000 IJ SOLN
INTRAMUSCULAR | Status: DC | PRN
  Administered 2019-09-12: 37 mL

## 2019-09-12 MED ORDER — ACETAMINOPHEN 500 MG PO TABS
ORAL_TABLET | ORAL | Status: AC
  Administered 2019-09-12: 1000 mg via ORAL
  Filled 2019-09-12: qty 2

## 2019-09-12 MED ORDER — PHENYLEPHRINE 40 MCG/ML (10ML) SYRINGE FOR IV PUSH (FOR BLOOD PRESSURE SUPPORT)
PREFILLED_SYRINGE | INTRAVENOUS | Status: AC
  Filled 2019-09-12: qty 10

## 2019-09-12 MED ORDER — SODIUM CHLORIDE 0.9 % IV SOLN: INTRAVENOUS | Status: DC | PRN

## 2019-09-12 MED ORDER — SUGAMMADEX SODIUM 200 MG/2ML IV SOLN: INTRAVENOUS | Status: DC | PRN

## 2019-09-12 MED ORDER — PHENYLEPHRINE 40 MCG/ML (10ML) SYRINGE FOR IV PUSH (FOR BLOOD PRESSURE SUPPORT)
PREFILLED_SYRINGE | INTRAVENOUS | Status: DC | PRN
  Administered 2019-09-12 (×2): 120 ug via INTRAVENOUS
  Administered 2019-09-12: 40 ug via INTRAVENOUS

## 2019-09-12 MED ORDER — LIDOCAINE-EPINEPHRINE 1 %-1:100000 IJ SOLN
INTRAMUSCULAR | Status: AC
  Filled 2019-09-12: qty 1

## 2019-09-12 MED ORDER — SODIUM CHLORIDE 0.9 % IV SOLN
INTRAVENOUS | Status: DC | PRN
  Administered 2019-09-12: 500 mL

## 2019-09-12 MED ORDER — ACETAMINOPHEN 500 MG PO TABS: 1000.0000 mg | ORAL_TABLET | Freq: Once | ORAL | Status: AC

## 2019-09-12 MED ORDER — SODIUM CHLORIDE 0.9 % IV SOLN
INTRAVENOUS | Status: AC
  Filled 2019-09-12: qty 1.2

## 2019-09-12 MED ORDER — LIDOCAINE 2% (20 MG/ML) 5 ML SYRINGE
INTRAMUSCULAR | Status: DC | PRN
  Administered 2019-09-12: 80 mg via INTRAVENOUS

## 2019-09-12 MED ORDER — VASOPRESSIN 20 UNIT/ML IV SOLN
INTRAVENOUS | Status: AC
  Filled 2019-09-12: qty 1

## 2019-09-12 SURGICAL SUPPLY — 37 items
ARMBAND PINK RESTRICT EXTREMIT (MISCELLANEOUS) ×3 IMPLANT
CANISTER SUCT 3000ML PPV (MISCELLANEOUS) ×3 IMPLANT
CANNULA VESSEL 3MM 2 BLNT TIP (CANNULA) ×3 IMPLANT
CLIP VESOCCLUDE MED 6/CT (CLIP) ×3 IMPLANT
CLIP VESOCCLUDE SM WIDE 6/CT (CLIP) ×3 IMPLANT
COVER PROBE W GEL 5X96 (DRAPES) ×1 IMPLANT
COVER WAND RF STERILE (DRAPES) ×3 IMPLANT
DECANTER SPIKE VIAL GLASS SM (MISCELLANEOUS) ×7 IMPLANT
DERMABOND ADVANCED (GAUZE/BANDAGES/DRESSINGS) ×2
DERMABOND ADVANCED .7 DNX12 (GAUZE/BANDAGES/DRESSINGS) ×1 IMPLANT
ELECT REM PT RETURN 9FT ADLT (ELECTROSURGICAL) ×3
ELECTRODE REM PT RTRN 9FT ADLT (ELECTROSURGICAL) ×1 IMPLANT
GLOVE BIO SURGEON STRL SZ7.5 (GLOVE) ×1 IMPLANT
GLOVE BIOGEL PI IND STRL 6.5 (GLOVE) IMPLANT
GLOVE BIOGEL PI IND STRL 7.0 (GLOVE) IMPLANT
GLOVE BIOGEL PI IND STRL 7.5 (GLOVE) IMPLANT
GLOVE BIOGEL PI IND STRL 8 (GLOVE) ×1 IMPLANT
GLOVE BIOGEL PI INDICATOR 6.5 (GLOVE) ×12
GLOVE BIOGEL PI INDICATOR 7.0 (GLOVE) ×6
GLOVE BIOGEL PI INDICATOR 7.5 (GLOVE) ×4
GLOVE BIOGEL PI INDICATOR 8 (GLOVE) ×2
GLOVE SURG SS PI 7.5 STRL IVOR (GLOVE) ×4 IMPLANT
GOWN STRL REUS W/ TWL LRG LVL3 (GOWN DISPOSABLE) ×3 IMPLANT
GOWN STRL REUS W/TWL LRG LVL3 (GOWN DISPOSABLE) ×12
KIT BASIN OR (CUSTOM PROCEDURE TRAY) ×3 IMPLANT
KIT TURNOVER KIT B (KITS) ×3 IMPLANT
NS IRRIG 1000ML POUR BTL (IV SOLUTION) ×3 IMPLANT
PACK CV ACCESS (CUSTOM PROCEDURE TRAY) ×3 IMPLANT
PAD ARMBOARD 7.5X6 YLW CONV (MISCELLANEOUS) ×6 IMPLANT
SPONGE SURGIFOAM ABS GEL 100 (HEMOSTASIS) IMPLANT
SUT PROLENE 6 0 BV (SUTURE) ×3 IMPLANT
SUT VIC AB 3-0 SH 27 (SUTURE) ×3
SUT VIC AB 3-0 SH 27X BRD (SUTURE) ×1 IMPLANT
SUT VICRYL 4-0 PS2 18IN ABS (SUTURE) ×3 IMPLANT
TOWEL GREEN STERILE (TOWEL DISPOSABLE) ×3 IMPLANT
UNDERPAD 30X30 (UNDERPADS AND DIAPERS) ×3 IMPLANT
WATER STERILE IRR 1000ML POUR (IV SOLUTION) ×3 IMPLANT

## 2019-09-12 NOTE — H&P (Signed)
REASON FOR VISIT:    For superficialization of AVF  ASSESSMENT & PLAN:   ESRD: For superficialization of L AVF   [name], MD Office: [phone]   HPI:   [name] is a pleasant 54 y.o. male, who does HD MWF. Who had a L BC AVF on 06/19/19. He was seen by PA on 08/10/19 and set up for superficialization of his AVF.  Past Medical History:  Diagnosis Date  . Anemia   . Anxiety   . Arthritis   . Bipolar 1 disorder ([place])    Denies  . Bronchitis    hx of  . Bruises easily   . Chronic pain syndrome 05/11/2012  . Chronic respiratory failure with hypoxia (HCC)    And with hypercapnia  . Constipation    chronic  . Diabetes mellitus without complication ([place])   . Diabetic neuropathy ([place])   . Dialysis patient ([place])   . DVT (deep venous thrombosis) (HCC)    LLE DVT ~ '12  . Dyspnea    with ambulation  . ESRD (end stage renal disease) ([place])    MWF- [facility]  . GERD (gastroesophageal reflux disease)   . History of 2019 novel coronavirus disease (COVID-19)   . HOH (hard of hearing) 2015   has hearing aids  . Mental disorder   . Migraine   . Neuromuscular disorder ([place])   . Obesity hypoventilation syndrome ([place])   . Obstructive sleep apnea    no longer  . PE (pulmonary embolism)    bilateral PE ~ '11  . Seizures ([place])   . Thrombocytopenia ([place]) 05/11/2012    Family History  Problem Relation Age of Onset  . Liver cancer Mother   . Cancer Mother        breast  . Arthritis Father   . Deep vein thrombosis Father        on warfarin  . Diabetes Paternal Grandfather     SOCIAL HISTORY: Social History   Socioeconomic History  . Marital status: Married    Spouse name: [name]   . Number of children: 1  . Years of education: 12+  . Highest education level: Associate degree: occupational, Hotel manager, or vocational program  Occupational History  . Occupation: Disabled    Employer: UNEMPLOYED  Tobacco Use  . Smoking status: Never Smoker  .  Smokeless tobacco: Former Systems developer    Types: Chew  Substance and Sexual Activity  . Alcohol use: No  . Drug use: No  . Sexual activity: Yes  Other Topics Concern  . Not on file  Social History Narrative   Divorced   No regular exercise   1 child   Patient is disabled.    Patient has an Designer, industrial/product.       Caffeine: none   Social Determinants of Health   Financial Resource Strain: Low Risk   . Difficulty of Paying Living Expenses: Not hard at all  Food Insecurity: No Food Insecurity  . Worried About Charity fundraiser in the Last Year: Never true  . Ran Out of Food in the Last Year: Never true  Transportation Needs: No Transportation Needs  . Lack of Transportation (Medical): No  . Lack of Transportation (Non-Medical): No  Physical Activity: Inactive  . Days of Exercise per Week: 0 days  . Minutes of Exercise per Session: 0 min  Stress: No Stress Concern Present  . Feeling of Stress : Not at all  Social Connections: Somewhat Isolated  .  Frequency of Communication with Friends and Family: More than three times a week  . Frequency of Social Gatherings with Friends and Family: More than three times a week  . Attends Religious Services: Never  . Active Member of Clubs or Organizations: No  . Attends Archivist Meetings: Never  . Marital Status: Married  Human resources officer Violence: Not At Risk  . Fear of Current or Ex-Partner: No  . Emotionally Abused: No  . Physically Abused: No  . Sexually Abused: No    Allergies  Allergen Reactions  . Bee Venom Anaphylaxis, Shortness Of Breath and Swelling  . Other Shortness Of Breath    Itching, rash with IVP DYE, iodine, shellfish LATEX  . Shellfish Allergy Nausea And Vomiting and Other (See Comments)    Feels like insides are twisting  . Iodinated Diagnostic Agents     Other reaction(s): RASH  . Iohexol      Code: RASH, Desc: PT WAS ON PREDNISONE FOR GOUT TX. @ TIME OF SCAN AND RECEIVED 50 MG OF BENADRYL IV-ARS  10/08/07   . Iodine Rash  . Latex Rash    Current Facility-Administered Medications  Medication Dose Route Frequency Provider Last Rate Last Admin  . 0.9 %  sodium chloride infusion   Intravenous Continuous Angelia Mould, MD      . ceFAZolin (ANCEF) 3 g in dextrose 5 % 50 mL IVPB  3 g Intravenous To SS-Surg Angelia Mould, MD      . chlorhexidine (HIBICLENS) 4 % liquid 4 application  60 mL Topical Once Angelia Mould, MD       And  . Derrill Memo ON 09/13/2019] chlorhexidine (HIBICLENS) 4 % liquid 4 application  60 mL Topical Once Angelia Mould, MD        REVIEW OF SYSTEMS:  [X]  denotes positive finding, [ ]  denotes negative finding Cardiac  Comments:  Chest pain or chest pressure:    Shortness of breath upon exertion:    Short of breath when lying flat:    Irregular heart rhythm:        Vascular    Pain in calf, thigh, or hip brought on by ambulation:    Pain in feet at night that wakes you up from your sleep:     Blood clot in your veins:    Leg swelling:         Pulmonary    Oxygen at home:    Productive cough:     Wheezing:         Neurologic    Sudden weakness in arms or legs:     Sudden numbness in arms or legs:     Sudden onset of difficulty speaking or slurred speech:    Temporary loss of vision in one eye:     Problems with dizziness:         Gastrointestinal    Blood in stool:     Vomited blood:         Genitourinary    Burning when urinating:     Blood in urine:        Psychiatric    Major depression:         Hematologic    Bleeding problems:    Problems with blood clotting too easily:        Skin    Rashes or ulcers:        Constitutional    Fever or chills:     PHYSICAL EXAM:  Vitals:   09/12/19 0643  BP: (!) 101/43  Pulse: 71  Resp: 16  Temp: 98.4 F (36.9 C)  SpO2: 95%  Weight: (!) 180.1 kg  Height: 6\' 3"  (1.905 m)    GENERAL: The patient is a well-nourished male, in no acute distress. The vital  signs are documented above. CARDIAC: There is a regular rate and rhythm.  VASCULAR: Good thrill in AVF left arm PULMONARY: There is good air exchange bilaterally without wheezing or rales. ABDOMEN: Soft and non-tender with normal pitched bowel sounds.  MUSCULOSKELETAL: There are no major deformities or cyanosis. NEUROLOGIC: No focal weakness or paresthesias are detected. SKIN: There are no ulcers or rashes noted. PSYCHIATRIC: The patient has a normal affect.  DATA:

## 2019-09-12 NOTE — Transfer of Care (Signed)
Immediate Anesthesia Transfer of Care Note  Patient: [name]  Procedure(s) Performed: FISTULA SUPERFICIALIZATION ON SECOND STAGE LEFT BRACHIOCEPHALIC ARTERIOVENOUS FISTULA (Left Arm Upper)  Patient Location: PACU  Anesthesia Type:General  Level of Consciousness: drowsy and patient cooperative  Airway & Oxygen Therapy: Patient Spontanous Breathing and Patient connected to face mask oxygen  Post-op Assessment: Report given to RN and Post -op Vital signs reviewed and stable  Post vital signs: Reviewed and stable  Last Vitals:  Vitals Value Taken Time  BP 112/55 09/12/19 0912  Temp    Pulse 76 09/12/19 0913  Resp 14 09/12/19 0913  SpO2 100 % 09/12/19 0913  Vitals shown include unvalidated device data.  Last Pain:  Vitals:   09/12/19 0659  PainSc: 3       Patients Stated Pain Goal: 4 (77/82/42 [id number])  Complications: No apparent anesthesia complications

## 2019-09-12 NOTE — Progress Notes (Signed)
Pt transferred from stretcher to mobilized wheelchair from home using the hoyer lift. Pt comfortable and informed in steps process throughout event. Small knick in skin on LFA from bracelet with bleeding secondary to coumadin. Pressure dressing applied. VSS. Will continue to monitor closely.

## 2019-09-12 NOTE — Progress Notes (Signed)
Patient has a Fentanyl patch on his right upper arm. Dr. Gifford Shave aware.

## 2019-09-12 NOTE — Op Note (Signed)
    NAME: [name]    MRN: [id number] DOB: March 08, 1966    DATE OF OPERATION: 09/12/2019  PREOP DIAGNOSIS:    End-stage renal disease  POSTOP DIAGNOSIS:    Same  PROCEDURE:    Superficialization of left brachiocephalic AV fistula with ligation of 3 competing branches  SURGEON: Judeth Cornfield. Scot Dock, MD  ASSIST: Leontine Locket, PA  ANESTHESIA: General  EBL: Minimal  INDICATIONS:    [name] is a 54 y.o. male who had a left brachiocephalic fistula placed which matured nicely however was too deep.  He presents for superficialization.  FINDINGS:   Large left upper arm fistula.  3 large competing branches were ligated.  TECHNIQUE:   The patient was taken to the operating room and received a general anesthetic.  I looked at the fistula myself with the SonoSite.  In the distal upper arm the vein was fairly superficial and I did not think it need to be mobilized at this level.  Above this area may an incision over the fistula or a branch was identified by duplex.  Here I dissected away adipose tissue anteriorly and lateral to the fistula in all directions.  The large competing branch was identified ligated and divided.  I fully mobilized the vein circumferentially.  I made a separate incision in the upper arm.  Of note I had an infiltrated with 40 cc of 1% lidocaine with epinephrine.  This was for hemostatic purposes mostly.  The vein in the upper arm incision was identified and circumferentially mobilized 2 large competing branches were divided.  This was done between 2-0 silk ties.  Again I removed using electrocautery a large amount of adipose tissue allowing the vein to float up closer to the surface.  Hemostasis was obtained in the wounds.  The deep layer was closed with 3-0 Vicryl and the skin closed with 4-0 Vicryl.  Dermabond was applied.  The patient tolerated the procedure well and was transferred to the recovery room in stable condition.  All needle and sponge  counts were correct.  [name], MD, FACS [facility]  DATE OF DICTATION:   09/12/2019

## 2019-09-12 NOTE — Anesthesia Postprocedure Evaluation (Signed)
Anesthesia Post Note  Patient: ALCEE SIPOS  Procedure(s) Performed: FISTULA SUPERFICIALIZATION ON SECOND STAGE LEFT BRACHIOCEPHALIC ARTERIOVENOUS FISTULA (Left Arm Upper)     Patient location during evaluation: PACU Anesthesia Type: General Level of consciousness: awake and alert Pain management: pain level controlled Vital Signs Assessment: post-procedure vital signs reviewed and stable Respiratory status: spontaneous breathing, nonlabored ventilation, respiratory function stable and patient connected to nasal cannula oxygen Cardiovascular status: blood pressure returned to baseline and stable Postop Assessment: no apparent nausea or vomiting Anesthetic complications: no    Last Vitals:  Vitals:   09/12/19 0940 09/12/19 0955  BP: 109/77 (!) 112/59  Pulse: 81 82  Resp: 16 17  Temp: 36.7 C 36.8 C  SpO2: 100% 100%    Last Pain:  Vitals:   09/12/19 0940  PainSc: 0-No pain                 Catalina Gravel

## 2019-09-12 NOTE — Telephone Encounter (Signed)
Left message, please call. 

## 2019-09-12 NOTE — Anesthesia Procedure Notes (Signed)
Procedure Name: LMA Insertion Date/Time: 09/12/2019 8:03 AM Performed by: Kathryne Hitch, CRNA Pre-anesthesia Checklist: Patient identified, Emergency Drugs available, Suction available and Patient being monitored Patient Re-evaluated:Patient Re-evaluated prior to induction Oxygen Delivery Method: Circle system utilized Preoxygenation: Pre-oxygenation with 100% oxygen Induction Type: IV induction Ventilation: Mask ventilation without difficulty LMA: LMA inserted LMA Size: 5.0 Number of attempts: 1 Tube secured with: Tape Dental Injury: Teeth and Oropharynx as per pre-operative assessment

## 2019-09-12 NOTE — Discharge Instructions (Signed)
   [facility]  Discharge Instructions  AV Fistula or Graft Surgery for Dialysis Access  Please refer to the following instructions for your post-procedure care. Your surgeon or physician assistant will discuss any changes with you.  Activity  You may drive the day following your surgery, if you are comfortable and no longer taking prescription pain medication. Resume full activity as the soreness in your incision resolves.  Bathing/Showering  You may shower after you go home. Keep your incision dry for 48 hours. Do not soak in a bathtub, hot tub, or swim until the incision heals completely. You may not shower if you have a hemodialysis catheter.  Incision Care  Clean your incision with mild soap and water after 48 hours. Pat the area dry with a clean towel. You do not need a bandage unless otherwise instructed. Do not apply any ointments or creams to your incision. You may have skin glue on your incision. Do not peel it off. It will come off on its own in about one week. Your arm may swell a bit after surgery. To reduce swelling use pillows to elevate your arm so it is above your heart. Your doctor will tell you if you need to lightly wrap your arm with an ACE bandage.  Diet  Resume your normal diet. There are not special food restrictions following this procedure. In order to heal from your surgery, it is CRITICAL to get adequate nutrition. Your body requires vitamins, minerals, and protein. Vegetables are the best source of vitamins and minerals. Vegetables also provide the perfect balance of protein. Processed food has little nutritional value, so try to avoid this.  Medications  Resume taking all of your medications. If your incision is causing pain, you may take over-the counter pain relievers such as acetaminophen (Tylenol). If you were prescribed a stronger pain medication, please be aware these medications can cause nausea and constipation. Prevent  nausea by taking the medication with a snack or meal. Avoid constipation by drinking plenty of fluids and eating foods with high amount of fiber, such as fruits, vegetables, and grains.  Do not take Tylenol if you are taking prescription pain medications.  Follow up Your surgeon may want to see you in the office following your access surgery. If so, this will be arranged at the time of your surgery.  Please call us immediately for any of the following conditions:  . Increased pain, redness, drainage (pus) from your incision site . Fever of 101 degrees or higher . Severe or worsening pain at your incision site . Hand pain or numbness. .  Reduce your risk of vascular disease:  . Stop smoking. If you would like help, call [facility] at 1-800-QUIT-NOW (774)543-9472) or [place] at 905-595-2679  . Manage your cholesterol . Maintain a desired weight . Control your diabetes . Keep your blood pressure down  Dialysis  It will take several weeks to several months for your new dialysis access to be ready for use. Your surgeon will determine when it is okay to use it. Your nephrologist will continue to direct your dialysis. You can continue to use your Permcath until your new access is ready for use.   09/12/2019 [name] [id number] 1966-04-06  Surgeon(s): Angelia Mould, MD  Procedure(s): Superficialization of left brachiocephalic AV fistula  x Do not stick fistula for 6 weeks    If you have any questions, please call the office at [phone].

## 2019-09-12 NOTE — Telephone Encounter (Signed)
INR today was 1.3  (too thick) , goal 2.0-3.0  Take 6 mg (2 tabs) today 09/12/19, then increase dose of  4.5 mg on  Sunday, Tuesday, Wednesday, Thursday, and Saturday,   and 3 mg all other days. Recheck INR in two weeks.

## 2019-09-13 DIAGNOSIS — D689 Coagulation defect, unspecified: Secondary | ICD-10-CM | POA: Diagnosis not present

## 2019-09-13 DIAGNOSIS — D509 Iron deficiency anemia, unspecified: Secondary | ICD-10-CM | POA: Diagnosis not present

## 2019-09-13 DIAGNOSIS — Z992 Dependence on renal dialysis: Secondary | ICD-10-CM | POA: Diagnosis not present

## 2019-09-13 DIAGNOSIS — N186 End stage renal disease: Secondary | ICD-10-CM | POA: Diagnosis not present

## 2019-09-13 DIAGNOSIS — E1129 Type 2 diabetes mellitus with other diabetic kidney complication: Secondary | ICD-10-CM | POA: Diagnosis not present

## 2019-09-13 DIAGNOSIS — N2581 Secondary hyperparathyroidism of renal origin: Secondary | ICD-10-CM | POA: Diagnosis not present

## 2019-09-13 DIAGNOSIS — D631 Anemia in chronic kidney disease: Secondary | ICD-10-CM | POA: Diagnosis not present

## 2019-09-14 ENCOUNTER — Other Ambulatory Visit: Payer: Self-pay | Admitting: Family

## 2019-09-15 ENCOUNTER — Telehealth: Payer: Self-pay | Admitting: Urology

## 2019-09-15 DIAGNOSIS — D631 Anemia in chronic kidney disease: Secondary | ICD-10-CM | POA: Diagnosis not present

## 2019-09-15 DIAGNOSIS — N186 End stage renal disease: Secondary | ICD-10-CM | POA: Diagnosis not present

## 2019-09-15 DIAGNOSIS — D689 Coagulation defect, unspecified: Secondary | ICD-10-CM | POA: Diagnosis not present

## 2019-09-15 DIAGNOSIS — Z992 Dependence on renal dialysis: Secondary | ICD-10-CM | POA: Diagnosis not present

## 2019-09-15 DIAGNOSIS — N2581 Secondary hyperparathyroidism of renal origin: Secondary | ICD-10-CM | POA: Diagnosis not present

## 2019-09-15 DIAGNOSIS — E1129 Type 2 diabetes mellitus with other diabetic kidney complication: Secondary | ICD-10-CM | POA: Diagnosis not present

## 2019-09-15 DIAGNOSIS — D509 Iron deficiency anemia, unspecified: Secondary | ICD-10-CM | POA: Diagnosis not present

## 2019-09-15 NOTE — Telephone Encounter (Signed)
March 18 th, patient was corresponding with provider via chart mail.

## 2019-09-15 NOTE — Telephone Encounter (Signed)
Please call patients wife. She has a question about cath.Wants to know if she can take it out for him. She said leaving a message is ok also.

## 2019-09-15 NOTE — Telephone Encounter (Signed)
For now I am fine with just keeping the catheter in.  Okay with staying off of medications.  Contrasted CT would be best.

## 2019-09-15 NOTE — Telephone Encounter (Signed)
Pt wife is wanting to take pt foley cath out- Please see below urochart note last seen by Bree   Case discussed with patient's urologist today. Patient would like to leave catheter in place for now, as this does improve his quality of life and he is tolerating it well. He is due for an exchange; however, due to limitations of our equipment we are unable to perform this safely in the office setting. Therefore, I recommended we have him set up for catheter exchanges with home health. He will need 2 [place] coude catheter for these exchanges, as they did have some issues with catheter placement in the hospital per his report. He will need urine specimen obtained at time of his first catheter exchange and sent for urine cytology with results sent to [place]. Given that we will leave catheter in place at this time and his issues with hypotension, am going to have him discontinue finasteride and tamsulosin. I will plan to have him return for follow up with repeat CT Hematuria imaging prior in approximatly 3 months. He does have contrast allergy and I will review contrast use with his urologist. Strict return precautions reviewed in the interim.   I am not sure if we would be able to complete voiding trial in office.

## 2019-09-15 NOTE — Telephone Encounter (Signed)
I called wife. Wife reports pt now on dialysis and has only been producing 50cc of urine a week. Wife requesting to take catheter out due to pt asking. Wife states she has supplies to remove water from the balloon.

## 2019-09-15 NOTE — Telephone Encounter (Signed)
Left message to call back  

## 2019-09-16 NOTE — Telephone Encounter (Signed)
I think it is fine to take the catheter out

## 2019-09-18 DIAGNOSIS — N186 End stage renal disease: Secondary | ICD-10-CM | POA: Diagnosis not present

## 2019-09-18 DIAGNOSIS — E1129 Type 2 diabetes mellitus with other diabetic kidney complication: Secondary | ICD-10-CM | POA: Diagnosis not present

## 2019-09-18 DIAGNOSIS — D689 Coagulation defect, unspecified: Secondary | ICD-10-CM | POA: Diagnosis not present

## 2019-09-18 DIAGNOSIS — D509 Iron deficiency anemia, unspecified: Secondary | ICD-10-CM | POA: Diagnosis not present

## 2019-09-18 DIAGNOSIS — Z992 Dependence on renal dialysis: Secondary | ICD-10-CM | POA: Diagnosis not present

## 2019-09-18 DIAGNOSIS — D631 Anemia in chronic kidney disease: Secondary | ICD-10-CM | POA: Diagnosis not present

## 2019-09-18 DIAGNOSIS — N2581 Secondary hyperparathyroidism of renal origin: Secondary | ICD-10-CM | POA: Diagnosis not present

## 2019-09-18 NOTE — Telephone Encounter (Signed)
Message left for wife Dr. Diona Fanti is okay with wife removing foley catheter

## 2019-09-19 ENCOUNTER — Telehealth: Payer: Self-pay | Admitting: *Deleted

## 2019-09-19 DIAGNOSIS — Z86718 Personal history of other venous thrombosis and embolism: Secondary | ICD-10-CM | POA: Diagnosis not present

## 2019-09-19 DIAGNOSIS — J810 Acute pulmonary edema: Secondary | ICD-10-CM

## 2019-09-19 DIAGNOSIS — Z7901 Long term (current) use of anticoagulants: Secondary | ICD-10-CM | POA: Diagnosis not present

## 2019-09-19 NOTE — Telephone Encounter (Signed)
INR today was 1.8 (too thick) , goal 2.0-3.0   Take 6 mg (2 tabs) today 09/19/19, then increase dose to 4.5 mg (1.5 tabs) every day. Recheck INR in 1 weeks.

## 2019-09-19 NOTE — Telephone Encounter (Signed)
Patient wife aware

## 2019-09-19 NOTE — Telephone Encounter (Signed)
Fax received mdINR PT/INR self testing service Test date/time 09/19/19 10:04 am INR 1.8

## 2019-09-20 DIAGNOSIS — Z992 Dependence on renal dialysis: Secondary | ICD-10-CM | POA: Diagnosis not present

## 2019-09-20 DIAGNOSIS — E1129 Type 2 diabetes mellitus with other diabetic kidney complication: Secondary | ICD-10-CM | POA: Diagnosis not present

## 2019-09-20 DIAGNOSIS — N186 End stage renal disease: Secondary | ICD-10-CM | POA: Diagnosis not present

## 2019-09-20 DIAGNOSIS — D631 Anemia in chronic kidney disease: Secondary | ICD-10-CM | POA: Diagnosis not present

## 2019-09-20 DIAGNOSIS — D509 Iron deficiency anemia, unspecified: Secondary | ICD-10-CM | POA: Diagnosis not present

## 2019-09-20 DIAGNOSIS — J449 Chronic obstructive pulmonary disease, unspecified: Secondary | ICD-10-CM | POA: Diagnosis not present

## 2019-09-20 DIAGNOSIS — N2581 Secondary hyperparathyroidism of renal origin: Secondary | ICD-10-CM | POA: Diagnosis not present

## 2019-09-20 DIAGNOSIS — D689 Coagulation defect, unspecified: Secondary | ICD-10-CM | POA: Diagnosis not present

## 2019-09-20 DIAGNOSIS — G8194 Hemiplegia, unspecified affecting left nondominant side: Secondary | ICD-10-CM | POA: Diagnosis not present

## 2019-09-20 DIAGNOSIS — I279 Pulmonary heart disease, unspecified: Secondary | ICD-10-CM | POA: Diagnosis not present

## 2019-09-22 ENCOUNTER — Telehealth: Payer: Medicare Other

## 2019-09-22 DIAGNOSIS — D689 Coagulation defect, unspecified: Secondary | ICD-10-CM | POA: Diagnosis not present

## 2019-09-22 DIAGNOSIS — Z992 Dependence on renal dialysis: Secondary | ICD-10-CM | POA: Diagnosis not present

## 2019-09-22 DIAGNOSIS — D509 Iron deficiency anemia, unspecified: Secondary | ICD-10-CM | POA: Diagnosis not present

## 2019-09-22 DIAGNOSIS — N2581 Secondary hyperparathyroidism of renal origin: Secondary | ICD-10-CM | POA: Diagnosis not present

## 2019-09-22 DIAGNOSIS — E1129 Type 2 diabetes mellitus with other diabetic kidney complication: Secondary | ICD-10-CM | POA: Diagnosis not present

## 2019-09-22 DIAGNOSIS — D631 Anemia in chronic kidney disease: Secondary | ICD-10-CM | POA: Diagnosis not present

## 2019-09-22 DIAGNOSIS — N186 End stage renal disease: Secondary | ICD-10-CM | POA: Diagnosis not present

## 2019-09-25 DIAGNOSIS — E1129 Type 2 diabetes mellitus with other diabetic kidney complication: Secondary | ICD-10-CM | POA: Diagnosis not present

## 2019-09-25 DIAGNOSIS — D631 Anemia in chronic kidney disease: Secondary | ICD-10-CM | POA: Diagnosis not present

## 2019-09-25 DIAGNOSIS — D689 Coagulation defect, unspecified: Secondary | ICD-10-CM | POA: Diagnosis not present

## 2019-09-25 DIAGNOSIS — Z992 Dependence on renal dialysis: Secondary | ICD-10-CM | POA: Diagnosis not present

## 2019-09-25 DIAGNOSIS — D509 Iron deficiency anemia, unspecified: Secondary | ICD-10-CM | POA: Diagnosis not present

## 2019-09-25 DIAGNOSIS — N186 End stage renal disease: Secondary | ICD-10-CM | POA: Diagnosis not present

## 2019-09-25 DIAGNOSIS — N2581 Secondary hyperparathyroidism of renal origin: Secondary | ICD-10-CM | POA: Diagnosis not present

## 2019-09-26 ENCOUNTER — Encounter: Payer: Self-pay | Admitting: Family

## 2019-09-26 ENCOUNTER — Telehealth (INDEPENDENT_AMBULATORY_CARE_PROVIDER_SITE_OTHER): Payer: Medicare Other | Admitting: Family

## 2019-09-26 DIAGNOSIS — E1122 Type 2 diabetes mellitus with diabetic chronic kidney disease: Secondary | ICD-10-CM

## 2019-09-26 DIAGNOSIS — Z79899 Other long term (current) drug therapy: Secondary | ICD-10-CM | POA: Diagnosis not present

## 2019-09-26 DIAGNOSIS — Z86718 Personal history of other venous thrombosis and embolism: Secondary | ICD-10-CM

## 2019-09-26 DIAGNOSIS — J810 Acute pulmonary edema: Secondary | ICD-10-CM

## 2019-09-26 DIAGNOSIS — L89159 Pressure ulcer of sacral region, unspecified stage: Secondary | ICD-10-CM | POA: Diagnosis not present

## 2019-09-26 DIAGNOSIS — Z86711 Personal history of pulmonary embolism: Secondary | ICD-10-CM

## 2019-09-26 DIAGNOSIS — S31109A Unspecified open wound of abdominal wall, unspecified quadrant without penetration into peritoneal cavity, initial encounter: Secondary | ICD-10-CM

## 2019-09-26 DIAGNOSIS — E1165 Type 2 diabetes mellitus with hyperglycemia: Secondary | ICD-10-CM

## 2019-09-26 DIAGNOSIS — IMO0002 Reserved for concepts with insufficient information to code with codable children: Secondary | ICD-10-CM

## 2019-09-26 DIAGNOSIS — G894 Chronic pain syndrome: Secondary | ICD-10-CM | POA: Diagnosis not present

## 2019-09-26 DIAGNOSIS — M549 Dorsalgia, unspecified: Secondary | ICD-10-CM | POA: Diagnosis not present

## 2019-09-26 DIAGNOSIS — N186 End stage renal disease: Secondary | ICD-10-CM

## 2019-09-26 DIAGNOSIS — Z7901 Long term (current) use of anticoagulants: Secondary | ICD-10-CM

## 2019-09-26 NOTE — Progress Notes (Addendum)
Virtual Visit via telephone Note Due to COVID-19 pandemic this visit was conducted virtually. This visit type was conducted due to national recommendations for restrictions regarding the COVID-19 Pandemic (e.g. social distancing, sheltering in place) in an effort to limit this patient's exposure and mitigate transmission in our community. All issues noted in this document were discussed and addressed.  A physical exam was not performed with this format.  I connected with [name] on 09/26/19 at 8:08 AM by video and verified that I am speaking with the correct person using two identifiers. [name] is currently located at home and wife is currently with him during visit. The provider, Evelina Dun, FNP is located in their office at time of visit.  I discussed the limitations, risks, security and privacy concerns of performing an evaluation and management service by telephone and the availability of in person appointments. I also discussed with the patient that there may be a patient responsible charge related to this service. The patient expressed understanding and agreed to proceed.   History and Present Illness:  HPI PT calls the office today with wound on left flank that he noticed three weeks ago. He reports the area is worse and has increased redness and discharge. They have been dressing every 3 days.   He also reports two fifty cent piece pressure ulcer on bilateral sacral area. His wife is applying a mepilex dressing every three days to this area. She reports the area is looking better, but not healed.   He also reports his INR today was 2.5. See anticoagulation flow sheet.   Review of Systems  Constitutional: Positive for malaise/fatigue.  Respiratory: Positive for shortness of breath.   Cardiovascular: Positive for leg swelling.  Skin:       Pressure ulcer     Observations/Objective: Pt morbid obese and laying in bed, able to talk but looks weak.    Assessment and Plan: 1. Postoperative pulmonary edema (HCC)  2. History of DVT (deep vein thrombosis)  3. Pressure injury of skin of sacral region, unspecified injury stage - Ambulatory referral to [place]  4. Open wound of flank, initial encounter - Ambulatory referral to [place]  5. Diabetes mellitus type 2, uncontrolled, with complications ([place]) - Ambulatory referral to [place]  6. ESRD (end stage renal disease) ([place]) - Ambulatory referral to Home Health  7. Chronic anticoagulation  8. History of pulmonary embolus (PE)  9. Morbid obesity ([place]) - Ambulatory referral to [facility] ordered today Description   INR today was 2.5 (perfect) , goal 2.0-3.0   Continue current  dose of 4.5 mg (1.5 tabs) every day. Recheck INR in 1 weeks.         I discussed the assessment and treatment plan with the patient. The patient was provided an opportunity to ask questions and all were answered. The patient agreed with the plan and demonstrated an understanding of the instructions.   The patient was advised to call back or seek an in-person evaluation if the symptoms worsen or if the condition fails to improve as anticipated.  The above assessment and management plan was discussed with the patient. The patient verbalized understanding of and has agreed to the management plan. Patient is aware to call the clinic if symptoms persist or worsen. Patient is aware when to return to the clinic for a follow-up visit. Patient educated on when it is appropriate to go to the emergency department.   Time call  ended:  8:27 Am  I provided 19 minutes of -face-to-face time during this encounter.    Evelina Dun, FNP

## 2019-09-27 DIAGNOSIS — N2581 Secondary hyperparathyroidism of renal origin: Secondary | ICD-10-CM | POA: Diagnosis not present

## 2019-09-27 DIAGNOSIS — E1122 Type 2 diabetes mellitus with diabetic chronic kidney disease: Secondary | ICD-10-CM | POA: Diagnosis not present

## 2019-09-27 DIAGNOSIS — D689 Coagulation defect, unspecified: Secondary | ICD-10-CM | POA: Diagnosis not present

## 2019-09-27 DIAGNOSIS — E1129 Type 2 diabetes mellitus with other diabetic kidney complication: Secondary | ICD-10-CM | POA: Diagnosis not present

## 2019-09-27 DIAGNOSIS — N12 Tubulo-interstitial nephritis, not specified as acute or chronic: Secondary | ICD-10-CM | POA: Diagnosis not present

## 2019-09-27 DIAGNOSIS — D631 Anemia in chronic kidney disease: Secondary | ICD-10-CM | POA: Diagnosis not present

## 2019-09-27 DIAGNOSIS — Z992 Dependence on renal dialysis: Secondary | ICD-10-CM | POA: Diagnosis not present

## 2019-09-27 DIAGNOSIS — J9611 Chronic respiratory failure with hypoxia: Secondary | ICD-10-CM | POA: Diagnosis not present

## 2019-09-27 DIAGNOSIS — N186 End stage renal disease: Secondary | ICD-10-CM | POA: Diagnosis not present

## 2019-09-27 DIAGNOSIS — D509 Iron deficiency anemia, unspecified: Secondary | ICD-10-CM | POA: Diagnosis not present

## 2019-09-29 DIAGNOSIS — N2581 Secondary hyperparathyroidism of renal origin: Secondary | ICD-10-CM | POA: Diagnosis not present

## 2019-09-29 DIAGNOSIS — D689 Coagulation defect, unspecified: Secondary | ICD-10-CM | POA: Diagnosis not present

## 2019-09-29 DIAGNOSIS — Z992 Dependence on renal dialysis: Secondary | ICD-10-CM | POA: Diagnosis not present

## 2019-09-29 DIAGNOSIS — E1129 Type 2 diabetes mellitus with other diabetic kidney complication: Secondary | ICD-10-CM | POA: Diagnosis not present

## 2019-09-29 DIAGNOSIS — D631 Anemia in chronic kidney disease: Secondary | ICD-10-CM | POA: Diagnosis not present

## 2019-09-29 DIAGNOSIS — D509 Iron deficiency anemia, unspecified: Secondary | ICD-10-CM | POA: Diagnosis not present

## 2019-09-29 DIAGNOSIS — N186 End stage renal disease: Secondary | ICD-10-CM | POA: Diagnosis not present

## 2019-10-02 ENCOUNTER — Other Ambulatory Visit: Payer: Self-pay | Admitting: Neurology

## 2019-10-02 ENCOUNTER — Other Ambulatory Visit: Payer: Self-pay | Admitting: Family

## 2019-10-02 ENCOUNTER — Other Ambulatory Visit: Payer: Self-pay | Admitting: Endocrinology

## 2019-10-02 ENCOUNTER — Other Ambulatory Visit: Payer: Self-pay

## 2019-10-02 DIAGNOSIS — R310 Gross hematuria: Secondary | ICD-10-CM

## 2019-10-02 DIAGNOSIS — D689 Coagulation defect, unspecified: Secondary | ICD-10-CM | POA: Diagnosis not present

## 2019-10-02 DIAGNOSIS — N2581 Secondary hyperparathyroidism of renal origin: Secondary | ICD-10-CM | POA: Diagnosis not present

## 2019-10-02 DIAGNOSIS — E1129 Type 2 diabetes mellitus with other diabetic kidney complication: Secondary | ICD-10-CM | POA: Diagnosis not present

## 2019-10-02 DIAGNOSIS — D509 Iron deficiency anemia, unspecified: Secondary | ICD-10-CM | POA: Diagnosis not present

## 2019-10-02 DIAGNOSIS — Z992 Dependence on renal dialysis: Secondary | ICD-10-CM | POA: Diagnosis not present

## 2019-10-02 DIAGNOSIS — D631 Anemia in chronic kidney disease: Secondary | ICD-10-CM | POA: Diagnosis not present

## 2019-10-02 DIAGNOSIS — Z794 Long term (current) use of insulin: Secondary | ICD-10-CM

## 2019-10-02 DIAGNOSIS — E1165 Type 2 diabetes mellitus with hyperglycemia: Secondary | ICD-10-CM

## 2019-10-02 DIAGNOSIS — N186 End stage renal disease: Secondary | ICD-10-CM | POA: Diagnosis not present

## 2019-10-02 DIAGNOSIS — G43719 Chronic migraine without aura, intractable, without status migrainosus: Secondary | ICD-10-CM

## 2019-10-02 NOTE — Telephone Encounter (Signed)
CT called asking if pt has been prepped for his CT tomorrow d/t allergy to contrast. I went to order the medication and review with wife. Wife reports she was given premeds by Valentino Nose ( I can't find in urochart where that was ever given) but wife states she has medication filled. Wife states pt is now on dialysis will have CT with contrast tomorrow and receive dialysis again on Wednesday. She just wanted you to be aware of his poor kidney function-

## 2019-10-03 ENCOUNTER — Other Ambulatory Visit: Payer: Self-pay

## 2019-10-03 ENCOUNTER — Telehealth: Payer: Self-pay

## 2019-10-03 ENCOUNTER — Telehealth: Payer: Self-pay | Admitting: *Deleted

## 2019-10-03 ENCOUNTER — Ambulatory Visit (HOSPITAL_COMMUNITY)
Admission: RE | Admit: 2019-10-03 | Discharge: 2019-10-03 | Disposition: A | Discharge status: Home / Self Care | Payer: Medicare Other | Source: Ambulatory Visit | Attending: Urology | Admitting: Urology

## 2019-10-03 DIAGNOSIS — Z86718 Personal history of other venous thrombosis and embolism: Secondary | ICD-10-CM

## 2019-10-03 DIAGNOSIS — R310 Gross hematuria: Secondary | ICD-10-CM | POA: Insufficient documentation

## 2019-10-03 DIAGNOSIS — R319 Hematuria, unspecified: Secondary | ICD-10-CM | POA: Diagnosis not present

## 2019-10-03 DIAGNOSIS — K7460 Unspecified cirrhosis of liver: Secondary | ICD-10-CM | POA: Diagnosis not present

## 2019-10-03 DIAGNOSIS — J810 Acute pulmonary edema: Secondary | ICD-10-CM

## 2019-10-03 MED ORDER — IOHEXOL 300 MG/ML  SOLN
150.0000 mL | Freq: Once | INTRAMUSCULAR | Status: AC | PRN
  Administered 2019-10-03: 125 mL via INTRAVENOUS

## 2019-10-03 NOTE — Telephone Encounter (Signed)
Wife aware and verbalized understanding

## 2019-10-03 NOTE — Telephone Encounter (Signed)
[facility] Radiology staff called report on pt CT that was done today. Report sent over to Dr. Diona Fanti to review whole report.

## 2019-10-03 NOTE — Telephone Encounter (Signed)
Fax received mdINR PT/INR self testing service Test date/time 10/03/19 951a INR 4.5

## 2019-10-03 NOTE — Telephone Encounter (Signed)
INR today was 4.5 (too thin) , goal 2.0-3.0   Hold today's dose 10/03/19, then decrease to 3 mg on Monday, Wednesday, and Friday, then Continue 4.5 mg (1.5 tabs)  All other days. Recheck INR in 1 weeks.

## 2019-10-04 DIAGNOSIS — N186 End stage renal disease: Secondary | ICD-10-CM | POA: Diagnosis not present

## 2019-10-04 DIAGNOSIS — D509 Iron deficiency anemia, unspecified: Secondary | ICD-10-CM | POA: Diagnosis not present

## 2019-10-04 DIAGNOSIS — D689 Coagulation defect, unspecified: Secondary | ICD-10-CM | POA: Diagnosis not present

## 2019-10-04 DIAGNOSIS — Z992 Dependence on renal dialysis: Secondary | ICD-10-CM | POA: Diagnosis not present

## 2019-10-04 DIAGNOSIS — N2581 Secondary hyperparathyroidism of renal origin: Secondary | ICD-10-CM | POA: Diagnosis not present

## 2019-10-04 DIAGNOSIS — D631 Anemia in chronic kidney disease: Secondary | ICD-10-CM | POA: Diagnosis not present

## 2019-10-04 DIAGNOSIS — E1129 Type 2 diabetes mellitus with other diabetic kidney complication: Secondary | ICD-10-CM | POA: Diagnosis not present

## 2019-10-04 NOTE — Progress Notes (Unsigned)
POST OPERATIVE OFFICE NOTE    CC:  F/u for surgery  HPI:  This is a 54 y.o. male who is s/p left brachiocephalic AV fistula superficialization 09/12/2019 by Dr. Scot Dock.  He is here for a f/u and wound check.  He denise pain, loss of motor and loss of sensation in the left UE.    Allergies  Allergen Reactions  . Bee Venom Anaphylaxis, Shortness Of Breath and Swelling  . Other Shortness Of Breath    Itching, rash with IVP DYE, iodine, shellfish LATEX  . Shellfish Allergy Nausea And Vomiting and Other (See Comments)    Feels like insides are twisting  . Iodinated Diagnostic Agents     Other reaction(s): RASH  . Iohexol      Code: RASH, Desc: PT WAS ON PREDNISONE FOR GOUT TX. @ TIME OF SCAN AND RECEIVED 50 MG OF BENADRYL IV-ARS 10/08/07   . Iodine Rash  . Latex Rash    Current Outpatient Medications  Medication Sig Dispense Refill  . albuterol (VENTOLIN HFA) 108 (90 Base) MCG/ACT inhaler TAKE 2 PUFFS BY MOUTH EVERY 6 HOURS AS NEEDED FOR WHEEZE OR SHORTNESS OF BREATH (Patient taking differently: Inhale 2 puffs into the lungs every 6 (six) hours as needed for wheezing or shortness of breath. ) 18 g 2  . busPIRone (BUSPAR) 15 MG tablet TAKE 1 TABLET (15 MG TOTAL) BY MOUTH 2 (TWO) TIMES DAILY. (Patient taking differently: Take 15 mg by mouth 2 (two) times daily. ) 180 tablet 0  . calcium acetate (PHOSLO) 667 MG capsule Take 2 capsules (1,334 mg total) by mouth 3 (three) times daily with meals. 180 capsule 0  . cetirizine (ZYRTEC) 10 MG tablet Take 1 tablet (10 mg total) by mouth daily. 30 tablet 11  . clomiPHENE (CLOMID) 50 MG tablet Take 0.5 tablets (25 mg total) by mouth every other day. 24 tablet 3  . DULoxetine (CYMBALTA) 60 MG capsule TAKE 1 CAPSULE BY MOUTH EVERY DAY (Patient taking differently: Take 60 mg by mouth daily. ) 90 capsule 0  . EMGALITY 120 MG/ML SOAJ INJECT 120 MG INTO THE SKIN EVERY 30 (THIRTY) DAYS. 1 pen 5  . EPIPEN 2-PAK 0.3 MG/0.3ML SOAJ injection INJECT 0.3 MLS  (0.3 MG TOTAL) INTO THE MUSCLE ONCE. AS NEEDED FOR ANAPHYLACTIC REACTION (Patient taking differently: Inject 0.3 mg into the muscle as needed for anaphylaxis. ) 2 Device 2  . esomeprazole (NEXIUM) 40 MG capsule TAKE 1 CAPSULE (40 MG TOTAL) BY MOUTH DAILY AT 12 NOON. 90 capsule 0  . fentaNYL (DURAGESIC) 50 MCG/HR Place 1 patch onto the skin every 3 (three) days.    . fluticasone (FLONASE) 50 MCG/ACT nasal spray PLACE 1 SPRAY INTO BOTH NOSTRILS 2 (TWO) TIMES DAILY AS NEEDED FOR ALLERGIES. (Patient taking differently: Place 1 spray into both nostrils daily as needed for allergies. ) 48 g 4  . HYDROcodone-acetaminophen (NORCO) 7.5-325 MG tablet Take 1 tablet by mouth every 6 (six) hours as needed for moderate pain. (Patient taking differently: Take 1 tablet by mouth every 4 (four) hours as needed for moderate pain. ) 90 tablet 0  . Insulin Glargine, 1 Unit Dial, (TOUJEO SOLOSTAR) 300 UNIT/ML SOPN Inject 45 Units into the skin 2 (two) times daily. (Patient taking differently: Inject 50 Units into the skin 2 (two) times daily. )    . Insulin Lispro (HUMALOG KWIKPEN [place]) Inject 30 Units into the skin 3 (three) times daily.    Marland Kitchen lamoTRIgine (LAMICTAL) 200 MG tablet TAKE 1 TABLET  BY MOUTH TWICE A DAY (Patient taking differently: Take 200 mg by mouth 2 (two) times daily. ) 180 tablet 0  . levETIRAcetam (KEPPRA) 500 MG tablet Take 1 tablet (500 mg total) by mouth 2 (two) times daily. 60 tablet 0  . midodrine (PROAMATINE) 10 MG tablet Take 10 mg by mouth 3 (three) times daily.    . Misc. Devices (MATTRESS COVER) MISC bariatric gel overlay mattress 1 each 0  . NARCAN 4 MG/0.1ML LIQD nasal spray kit Place 1 spray into the nose once.     . polyethylene glycol (MIRALAX / GLYCOLAX) 17 g packet Take 17 g by mouth 2 (two) times daily. (Patient taking differently: Take 17 g by mouth daily as needed for mild constipation or moderate constipation. ) 30 each 0  . pregabalin (LYRICA) 50 MG capsule Take 1 capsule (50 mg total)  by mouth 2 (two) times daily.    . rizatriptan (MAXALT) 10 MG tablet Take 1 tablet (10 mg total) by mouth as needed. May repeat in 2 hours if needed    . senna (SENOKOT) 8.6 MG TABS tablet Take 1 tablet (8.6 mg total) by mouth at bedtime. 30 tablet 0  . vitamin C (VITAMIN C) 500 MG tablet Take 1 tablet (500 mg total) by mouth daily.    . Vitamin D, Ergocalciferol, (DRISDOL) 1.25 MG (50000 UT) CAPS capsule Take 50,000 Units by mouth once a week. Monday    . warfarin (COUMADIN) 3 MG tablet TAKE 1 TABLET BY MOUTH DAILY 90 tablet 0  . zinc sulfate 220 (50 Zn) MG capsule Take 1 capsule (220 mg total) by mouth 2 (two) times daily.     No current facility-administered medications for this visit.     ROS:  See HPI  Physical Exam:  ***  Incision:  *** Extremities:  *** Neuro: *** Abdomen:  ***  Assessment/Plan:  This is a 54 y.o. male who is s/p: ***  -***   *** [facility] [phone]  Clinic MD:  ***

## 2019-10-05 ENCOUNTER — Other Ambulatory Visit (HOSPITAL_COMMUNITY): Payer: Self-pay

## 2019-10-05 DIAGNOSIS — N185 Chronic kidney disease, stage 5: Secondary | ICD-10-CM | POA: Diagnosis not present

## 2019-10-05 DIAGNOSIS — S21102D Unspecified open wound of left front wall of thorax without penetration into thoracic cavity, subsequent encounter: Secondary | ICD-10-CM | POA: Diagnosis not present

## 2019-10-05 DIAGNOSIS — L89322 Pressure ulcer of left buttock, stage 2: Secondary | ICD-10-CM | POA: Diagnosis not present

## 2019-10-05 DIAGNOSIS — M6281 Muscle weakness (generalized): Secondary | ICD-10-CM | POA: Diagnosis not present

## 2019-10-05 DIAGNOSIS — J9611 Chronic respiratory failure with hypoxia: Secondary | ICD-10-CM | POA: Diagnosis not present

## 2019-10-05 DIAGNOSIS — L89152 Pressure ulcer of sacral region, stage 2: Secondary | ICD-10-CM | POA: Diagnosis not present

## 2019-10-05 DIAGNOSIS — E1122 Type 2 diabetes mellitus with diabetic chronic kidney disease: Secondary | ICD-10-CM | POA: Diagnosis not present

## 2019-10-06 ENCOUNTER — Telehealth: Payer: Self-pay | Admitting: *Deleted

## 2019-10-06 DIAGNOSIS — D689 Coagulation defect, unspecified: Secondary | ICD-10-CM | POA: Diagnosis not present

## 2019-10-06 DIAGNOSIS — D631 Anemia in chronic kidney disease: Secondary | ICD-10-CM | POA: Diagnosis not present

## 2019-10-06 DIAGNOSIS — N2581 Secondary hyperparathyroidism of renal origin: Secondary | ICD-10-CM | POA: Diagnosis not present

## 2019-10-06 DIAGNOSIS — D509 Iron deficiency anemia, unspecified: Secondary | ICD-10-CM | POA: Diagnosis not present

## 2019-10-06 DIAGNOSIS — N186 End stage renal disease: Secondary | ICD-10-CM | POA: Diagnosis not present

## 2019-10-06 DIAGNOSIS — Z992 Dependence on renal dialysis: Secondary | ICD-10-CM | POA: Diagnosis not present

## 2019-10-06 DIAGNOSIS — E1129 Type 2 diabetes mellitus with other diabetic kidney complication: Secondary | ICD-10-CM | POA: Diagnosis not present

## 2019-10-06 MED ORDER — DOXYCYCLINE HYCLATE 100 MG PO TABS: 100.0000 mg | ORAL_TABLET | Freq: Two times a day (BID) | ORAL | 0 refills | Status: DC

## 2019-10-06 NOTE — Telephone Encounter (Signed)
doxycycline Prescription sent to pharmacy

## 2019-10-06 NOTE — Telephone Encounter (Signed)
Vm from [place] w/ [facility] visit yesterday, feels like flank wound may be infected, has odor and exudate on it, may need abx called to [facility] Please let nurse & pt know

## 2019-10-06 NOTE — Telephone Encounter (Signed)
Wife aware and verbalizes understanding.  

## 2019-10-06 NOTE — Addendum Note (Signed)
Addended by: Evelina Dun A on: 10/06/2019 01:31 PM   Modules accepted: Orders

## 2019-10-09 ENCOUNTER — Other Ambulatory Visit: Payer: Self-pay | Admitting: Family

## 2019-10-09 DIAGNOSIS — D689 Coagulation defect, unspecified: Secondary | ICD-10-CM | POA: Diagnosis not present

## 2019-10-09 DIAGNOSIS — E1129 Type 2 diabetes mellitus with other diabetic kidney complication: Secondary | ICD-10-CM | POA: Diagnosis not present

## 2019-10-09 DIAGNOSIS — Z992 Dependence on renal dialysis: Secondary | ICD-10-CM | POA: Diagnosis not present

## 2019-10-09 DIAGNOSIS — N2581 Secondary hyperparathyroidism of renal origin: Secondary | ICD-10-CM | POA: Diagnosis not present

## 2019-10-09 DIAGNOSIS — N186 End stage renal disease: Secondary | ICD-10-CM | POA: Diagnosis not present

## 2019-10-09 DIAGNOSIS — D631 Anemia in chronic kidney disease: Secondary | ICD-10-CM | POA: Diagnosis not present

## 2019-10-09 DIAGNOSIS — D509 Iron deficiency anemia, unspecified: Secondary | ICD-10-CM | POA: Diagnosis not present

## 2019-10-10 ENCOUNTER — Encounter: Payer: Self-pay | Admitting: Urology

## 2019-10-10 ENCOUNTER — Other Ambulatory Visit: Payer: Self-pay

## 2019-10-10 ENCOUNTER — Telehealth: Payer: Self-pay

## 2019-10-10 ENCOUNTER — Telehealth: Payer: Self-pay | Admitting: *Deleted

## 2019-10-10 ENCOUNTER — Ambulatory Visit (INDEPENDENT_AMBULATORY_CARE_PROVIDER_SITE_OTHER): Payer: Medicare Other | Admitting: Urology

## 2019-10-10 VITALS — BP 102/67 | HR 81 | Temp 98.5°F | Ht 75.0 in | Wt 378.0 lb

## 2019-10-10 DIAGNOSIS — N400 Enlarged prostate without lower urinary tract symptoms: Secondary | ICD-10-CM | POA: Diagnosis not present

## 2019-10-10 DIAGNOSIS — Z86718 Personal history of other venous thrombosis and embolism: Secondary | ICD-10-CM

## 2019-10-10 DIAGNOSIS — J810 Acute pulmonary edema: Secondary | ICD-10-CM

## 2019-10-10 DIAGNOSIS — I3139 Other pericardial effusion (noninflammatory): Secondary | ICD-10-CM

## 2019-10-10 DIAGNOSIS — N185 Chronic kidney disease, stage 5: Secondary | ICD-10-CM | POA: Diagnosis not present

## 2019-10-10 DIAGNOSIS — N281 Cyst of kidney, acquired: Secondary | ICD-10-CM

## 2019-10-10 DIAGNOSIS — S21109A Unspecified open wound of unspecified front wall of thorax without penetration into thoracic cavity, initial encounter: Secondary | ICD-10-CM | POA: Diagnosis not present

## 2019-10-10 DIAGNOSIS — I313 Pericardial effusion (noninflammatory): Secondary | ICD-10-CM
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
## 2019-10-10 NOTE — Progress Notes (Signed)
Urological Symptom Review  Patient is experiencing the following symptoms: None   Review of Systems  Gastrointestinal (upper)  : Negative for upper GI symptoms  Gastrointestinal (lower) : Negative for lower GI symptoms  Constitutional : Negative for symptoms  Skin: Negative for skin symptoms  Eyes: Negative for eye symptoms  Ear/Nose/Throat : Negative for Ear/Nose/Throat symptoms  Hematologic/Lymphatic: Easy bruising  Cardiovascular : Negative for cardiovascular symptoms  Respiratory : Negative for respiratory symptoms  Endocrine: Negative for endocrine symptoms  Musculoskeletal: Back pain Joint pain  Neurological: Negative for neurological symptoms  Psychologic: Negative for psychiatric symptoms

## 2019-10-10 NOTE — Telephone Encounter (Signed)
INR today was 3.6(too thin) , goal 2.0-3.0   Hold today's dose 10/10/19, then decrease to 3 mg on Monday and Friday, then Continue 4.5 mg (1.5 tabs)  All other days. Recheck INR in 1 weeks.

## 2019-10-10 NOTE — Telephone Encounter (Signed)
Spoke to patient's wife Dr.Jordan received a message from [facility] requesting husband to be seen for pericardial effusion.Appointment scheduled with Dr.Jordan 10/31/19 at 1:40 pm.Dr.Jordan advised a echo before appointment.Scheduler will call back with echo appointment.

## 2019-10-10 NOTE — Telephone Encounter (Signed)
Patient aware and verbalized understanding. °

## 2019-10-10 NOTE — Telephone Encounter (Signed)
Fax received [facility]INR PT/INR self testing service Test date/time 10/10/19 12:14 pm INR 3.6

## 2019-10-11 ENCOUNTER — Encounter: Payer: Self-pay | Admitting: Vascular Surgery

## 2019-10-11 ENCOUNTER — Ambulatory Visit (INDEPENDENT_AMBULATORY_CARE_PROVIDER_SITE_OTHER): Payer: Medicare Other | Admitting: Vascular Surgery

## 2019-10-11 VITALS — BP 91/67 | HR 80 | Temp 97.1°F

## 2019-10-11 DIAGNOSIS — Z48812 Encounter for surgical aftercare following surgery on the circulatory system: Secondary | ICD-10-CM

## 2019-10-11 DIAGNOSIS — N2581 Secondary hyperparathyroidism of renal origin: Secondary | ICD-10-CM | POA: Diagnosis not present

## 2019-10-11 DIAGNOSIS — Z992 Dependence on renal dialysis: Secondary | ICD-10-CM | POA: Diagnosis not present

## 2019-10-11 DIAGNOSIS — E1129 Type 2 diabetes mellitus with other diabetic kidney complication: Secondary | ICD-10-CM | POA: Diagnosis not present

## 2019-10-11 DIAGNOSIS — D689 Coagulation defect, unspecified: Secondary | ICD-10-CM | POA: Diagnosis not present

## 2019-10-11 DIAGNOSIS — N186 End stage renal disease: Secondary | ICD-10-CM

## 2019-10-11 DIAGNOSIS — D631 Anemia in chronic kidney disease: Secondary | ICD-10-CM | POA: Diagnosis not present

## 2019-10-11 DIAGNOSIS — D509 Iron deficiency anemia, unspecified: Secondary | ICD-10-CM | POA: Diagnosis not present

## 2019-10-11 NOTE — Progress Notes (Signed)
Patient name: [name] MRN: [id number] DOB: 1965/11/19 Sex: male  REASON FOR VISIT:   Follow-up after superficialization of left brachiocephalic AV fistula  HPI:   [name] is a pleasant 54 y.o. male who had a left brachiocephalic AV fistula which had matured nicely.  However the vein was too deep.  He was set up for superficialization which was performed on 09/12/2019.  He comes in for a follow-up visit.  He has no specific complaints.  He does state that his blood pressure has been running low.  He is currently using his catheter for dialysis.  Current Outpatient Medications  Medication Sig Dispense Refill  . albuterol (VENTOLIN HFA) 108 (90 Base) MCG/ACT inhaler TAKE 2 PUFFS BY MOUTH EVERY 6 HOURS AS NEEDED FOR WHEEZE OR SHORTNESS OF BREATH (Patient taking differently: Inhale 2 puffs into the lungs every 6 (six) hours as needed for wheezing or shortness of breath. ) 18 g 2  . busPIRone (BUSPAR) 15 MG tablet TAKE 1 TABLET (15 MG TOTAL) BY MOUTH 2 (TWO) TIMES DAILY. (Patient taking differently: Take 15 mg by mouth 2 (two) times daily. ) 180 tablet 0  . calcium acetate (PHOSLO) 667 MG capsule Take 2 capsules (1,334 mg total) by mouth 3 (three) times daily with meals. 180 capsule 0  . cetirizine (ZYRTEC) 10 MG tablet Take 1 tablet (10 mg total) by mouth daily. 30 tablet 11  . clomiPHENE (CLOMID) 50 MG tablet Take 0.5 tablets (25 mg total) by mouth every other day. 24 tablet 3  . doxycycline (VIBRA-TABS) 100 MG tablet Take 1 tablet (100 mg total) by mouth 2 (two) times daily. 20 tablet 0  . DULoxetine (CYMBALTA) 60 MG capsule TAKE 1 CAPSULE BY MOUTH EVERY DAY (Patient taking differently: Take 60 mg by mouth daily. ) 90 capsule 0  . EMGALITY 120 MG/ML SOAJ INJECT 120 MG INTO THE SKIN EVERY 30 (THIRTY) DAYS. 1 pen 5  . EPIPEN 2-PAK 0.3 MG/0.3ML SOAJ injection INJECT 0.3 MLS (0.3 MG TOTAL) INTO THE MUSCLE ONCE. AS NEEDED FOR ANAPHYLACTIC REACTION (Patient taking differently: Inject  0.3 mg into the muscle as needed for anaphylaxis. ) 2 Device 2  . esomeprazole (NEXIUM) 40 MG capsule TAKE 1 CAPSULE (40 MG TOTAL) BY MOUTH DAILY AT 12 NOON. 90 capsule 0  . fentaNYL (DURAGESIC) 50 MCG/HR Place 1 patch onto the skin every 3 (three) days.    . fluticasone (FLONASE) 50 MCG/ACT nasal spray PLACE 1 SPRAY INTO BOTH NOSTRILS 2 (TWO) TIMES DAILY AS NEEDED FOR ALLERGIES. 48 mL 3  . HYDROcodone-acetaminophen (NORCO) 7.5-325 MG tablet Take 1 tablet by mouth every 6 (six) hours as needed for moderate pain. (Patient taking differently: Take 1 tablet by mouth every 4 (four) hours as needed for moderate pain. ) 90 tablet 0  . Insulin Glargine, 1 Unit Dial, (TOUJEO SOLOSTAR) 300 UNIT/ML SOPN Inject 45 Units into the skin 2 (two) times daily. (Patient taking differently: Inject 50 Units into the skin 2 (two) times daily. )    . Insulin Lispro (HUMALOG KWIKPEN [place]) Inject 30 Units into the skin 3 (three) times daily.    Marland Kitchen lamoTRIgine (LAMICTAL) 200 MG tablet TAKE 1 TABLET BY MOUTH TWICE A DAY (Patient taking differently: Take 200 mg by mouth 2 (two) times daily. ) 180 tablet 0  . levETIRAcetam (KEPPRA) 500 MG tablet Take 1 tablet (500 mg total) by mouth 2 (two) times daily. 60 tablet 0  . midodrine (PROAMATINE) 10 MG tablet Take 10 mg  by mouth 3 (three) times daily.    . Misc. Devices (MATTRESS COVER) MISC bariatric gel overlay mattress 1 each 0  . NARCAN 4 MG/0.1ML LIQD nasal spray kit Place 1 spray into the nose once.     . polyethylene glycol (MIRALAX / GLYCOLAX) 17 g packet Take 17 g by mouth 2 (two) times daily. (Patient taking differently: Take 17 g by mouth daily as needed for mild constipation or moderate constipation. ) 30 each 0  . pregabalin (LYRICA) 50 MG capsule Take 1 capsule (50 mg total) by mouth 2 (two) times daily.    . rizatriptan (MAXALT) 10 MG tablet Take 1 tablet (10 mg total) by mouth as needed. May repeat in 2 hours if needed    . senna (SENOKOT) 8.6 MG TABS tablet Take 1 tablet  (8.6 mg total) by mouth at bedtime. 30 tablet 0  . vitamin C (VITAMIN C) 500 MG tablet Take 1 tablet (500 mg total) by mouth daily.    . Vitamin D, Ergocalciferol, (DRISDOL) 1.25 MG (50000 UT) CAPS capsule Take 50,000 Units by mouth once a week. Monday    . warfarin (COUMADIN) 3 MG tablet TAKE 1 TABLET BY MOUTH DAILY 90 tablet 0  . zinc sulfate 220 (50 Zn) MG capsule Take 1 capsule (220 mg total) by mouth 2 (two) times daily.     No current facility-administered medications for this visit.    REVIEW OF SYSTEMS:  [X]  denotes positive finding, [ ]  denotes negative finding Vascular    Leg swelling    Cardiac    Chest pain or chest pressure:    Shortness of breath upon exertion:    Short of breath when lying flat:    Irregular heart rhythm:    Constitutional    Fever or chills:     PHYSICAL EXAM:   Vitals:   10/11/19 0833  BP: 91/67  Pulse: 80  Temp: (!) 97.1 F (36.2 C)  TempSrc: Temporal  SpO2: 98%    GENERAL: The patient is a well-nourished male, in no acute distress. The vital signs are documented above. CARDIOVASCULAR: There is a regular rate and rhythm. PULMONARY: There is good air exchange bilaterally without wheezing or rales. VASCULAR: His fistula has a good thrill.  His pressures running low today with a systolic pressure of 91.  Incisions are healing nicely.  DATA:   No new data.  MEDICAL ISSUES:   END-STAGE RENAL DISEASE: The patient has undergone superficialization of his left upper arm fistula.  I think this will be ready to cannulate in 2 weeks.  Certainly will help with his blood pressures higher and as the vein continues to mature.  We will see him back as needed.  [name] [facility] [phone]

## 2019-10-12 ENCOUNTER — Other Ambulatory Visit: Payer: Medicare Other | Admitting: Internal Medicine

## 2019-10-12 ENCOUNTER — Other Ambulatory Visit: Payer: Self-pay

## 2019-10-12 DIAGNOSIS — E1122 Type 2 diabetes mellitus with diabetic chronic kidney disease: Secondary | ICD-10-CM | POA: Diagnosis not present

## 2019-10-12 DIAGNOSIS — L89322 Pressure ulcer of left buttock, stage 2: Secondary | ICD-10-CM | POA: Diagnosis not present

## 2019-10-12 DIAGNOSIS — N185 Chronic kidney disease, stage 5: Secondary | ICD-10-CM | POA: Diagnosis not present

## 2019-10-12 DIAGNOSIS — G8194 Hemiplegia, unspecified affecting left nondominant side: Secondary | ICD-10-CM | POA: Diagnosis not present

## 2019-10-12 DIAGNOSIS — G4733 Obstructive sleep apnea (adult) (pediatric): Secondary | ICD-10-CM | POA: Diagnosis not present

## 2019-10-12 DIAGNOSIS — J9611 Chronic respiratory failure with hypoxia: Secondary | ICD-10-CM | POA: Diagnosis not present

## 2019-10-12 DIAGNOSIS — L89152 Pressure ulcer of sacral region, stage 2: Secondary | ICD-10-CM | POA: Diagnosis not present

## 2019-10-12 DIAGNOSIS — M6281 Muscle weakness (generalized): Secondary | ICD-10-CM | POA: Diagnosis not present

## 2019-10-12 DIAGNOSIS — J449 Chronic obstructive pulmonary disease, unspecified: Secondary | ICD-10-CM | POA: Diagnosis not present

## 2019-10-12 DIAGNOSIS — S21102D Unspecified open wound of left front wall of thorax without penetration into thoracic cavity, subsequent encounter: Secondary | ICD-10-CM | POA: Diagnosis not present

## 2019-10-13 DIAGNOSIS — N186 End stage renal disease: Secondary | ICD-10-CM | POA: Diagnosis not present

## 2019-10-13 DIAGNOSIS — E1129 Type 2 diabetes mellitus with other diabetic kidney complication: Secondary | ICD-10-CM | POA: Diagnosis not present

## 2019-10-13 DIAGNOSIS — D689 Coagulation defect, unspecified: Secondary | ICD-10-CM | POA: Diagnosis not present

## 2019-10-13 DIAGNOSIS — Z992 Dependence on renal dialysis: Secondary | ICD-10-CM | POA: Diagnosis not present

## 2019-10-13 DIAGNOSIS — N2581 Secondary hyperparathyroidism of renal origin: Secondary | ICD-10-CM | POA: Diagnosis not present

## 2019-10-13 DIAGNOSIS — D509 Iron deficiency anemia, unspecified: Secondary | ICD-10-CM | POA: Diagnosis not present

## 2019-10-13 DIAGNOSIS — D631 Anemia in chronic kidney disease: Secondary | ICD-10-CM | POA: Diagnosis not present

## 2019-10-16 ENCOUNTER — Other Ambulatory Visit: Payer: Self-pay

## 2019-10-16 ENCOUNTER — Ambulatory Visit (INDEPENDENT_AMBULATORY_CARE_PROVIDER_SITE_OTHER): Payer: Medicare Other

## 2019-10-16 DIAGNOSIS — S21102D Unspecified open wound of left front wall of thorax without penetration into thoracic cavity, subsequent encounter: Secondary | ICD-10-CM | POA: Diagnosis not present

## 2019-10-16 DIAGNOSIS — L89152 Pressure ulcer of sacral region, stage 2: Secondary | ICD-10-CM

## 2019-10-16 DIAGNOSIS — Z7901 Long term (current) use of anticoagulants: Secondary | ICD-10-CM

## 2019-10-16 DIAGNOSIS — N186 End stage renal disease: Secondary | ICD-10-CM | POA: Diagnosis not present

## 2019-10-16 DIAGNOSIS — N2581 Secondary hyperparathyroidism of renal origin: Secondary | ICD-10-CM | POA: Diagnosis not present

## 2019-10-16 DIAGNOSIS — D509 Iron deficiency anemia, unspecified: Secondary | ICD-10-CM | POA: Diagnosis not present

## 2019-10-16 DIAGNOSIS — Z86711 Personal history of pulmonary embolism: Secondary | ICD-10-CM

## 2019-10-16 DIAGNOSIS — Z8616 Personal history of COVID-19: Secondary | ICD-10-CM

## 2019-10-16 DIAGNOSIS — E1129 Type 2 diabetes mellitus with other diabetic kidney complication: Secondary | ICD-10-CM | POA: Diagnosis not present

## 2019-10-16 DIAGNOSIS — F329 Major depressive disorder, single episode, unspecified: Secondary | ICD-10-CM

## 2019-10-16 DIAGNOSIS — M6281 Muscle weakness (generalized): Secondary | ICD-10-CM

## 2019-10-16 DIAGNOSIS — L89322 Pressure ulcer of left buttock, stage 2: Secondary | ICD-10-CM

## 2019-10-16 DIAGNOSIS — E1122 Type 2 diabetes mellitus with diabetic chronic kidney disease: Secondary | ICD-10-CM | POA: Diagnosis not present

## 2019-10-16 DIAGNOSIS — J9611 Chronic respiratory failure with hypoxia: Secondary | ICD-10-CM

## 2019-10-16 DIAGNOSIS — N185 Chronic kidney disease, stage 5: Secondary | ICD-10-CM | POA: Diagnosis not present

## 2019-10-16 DIAGNOSIS — D689 Coagulation defect, unspecified: Secondary | ICD-10-CM | POA: Diagnosis not present

## 2019-10-16 DIAGNOSIS — D631 Anemia in chronic kidney disease: Secondary | ICD-10-CM | POA: Diagnosis not present

## 2019-10-16 DIAGNOSIS — Z6841 Body Mass Index (BMI) 40.0 and over, adult: Secondary | ICD-10-CM

## 2019-10-16 DIAGNOSIS — Z992 Dependence on renal dialysis: Secondary | ICD-10-CM | POA: Diagnosis not present

## 2019-10-16 DIAGNOSIS — Z86718 Personal history of other venous thrombosis and embolism: Secondary | ICD-10-CM

## 2019-10-16 DIAGNOSIS — Z794 Long term (current) use of insulin: Secondary | ICD-10-CM

## 2019-10-17 ENCOUNTER — Telehealth: Payer: Self-pay | Admitting: *Deleted

## 2019-10-17 ENCOUNTER — Ambulatory Visit (HOSPITAL_COMMUNITY): Payer: Medicare Other

## 2019-10-17 ENCOUNTER — Other Ambulatory Visit (HOSPITAL_COMMUNITY): Payer: Medicare Other

## 2019-10-17 DIAGNOSIS — Z86718 Personal history of other venous thrombosis and embolism: Secondary | ICD-10-CM

## 2019-10-17 DIAGNOSIS — J810 Acute pulmonary edema: Secondary | ICD-10-CM

## 2019-10-17 DIAGNOSIS — L89152 Pressure ulcer of sacral region, stage 2: Secondary | ICD-10-CM | POA: Diagnosis not present

## 2019-10-17 DIAGNOSIS — S21102D Unspecified open wound of left front wall of thorax without penetration into thoracic cavity, subsequent encounter: Secondary | ICD-10-CM | POA: Diagnosis not present

## 2019-10-17 DIAGNOSIS — Z7901 Long term (current) use of anticoagulants: Secondary | ICD-10-CM | POA: Diagnosis not present

## 2019-10-17 DIAGNOSIS — M6281 Muscle weakness (generalized): Secondary | ICD-10-CM | POA: Diagnosis not present

## 2019-10-17 DIAGNOSIS — N185 Chronic kidney disease, stage 5: Secondary | ICD-10-CM | POA: Diagnosis not present

## 2019-10-17 DIAGNOSIS — E1122 Type 2 diabetes mellitus with diabetic chronic kidney disease: Secondary | ICD-10-CM | POA: Diagnosis not present

## 2019-10-17 DIAGNOSIS — J9611 Chronic respiratory failure with hypoxia: Secondary | ICD-10-CM | POA: Diagnosis not present

## 2019-10-17 DIAGNOSIS — L89322 Pressure ulcer of left buttock, stage 2: Secondary | ICD-10-CM | POA: Diagnosis not present

## 2019-10-17 NOTE — Telephone Encounter (Signed)
Fax received mdINR PT/INR self testing service Test date/time 10/17/19 946 am INR 3.4

## 2019-10-17 NOTE — Telephone Encounter (Signed)
Patient wife aware and verbalized understanding. 

## 2019-10-17 NOTE — Telephone Encounter (Signed)
INR today was 3.6(too thin) , goal 2.0-3.0   Hold today's dose 10/17/19, then decrease to 3 mg on everyday except Tuesday and Thursday 4.5 mg (1.5 tabs)   Recheck INR in 1 weeks.

## 2019-10-17 NOTE — Telephone Encounter (Signed)
Lmtcb.

## 2019-10-18 DIAGNOSIS — E1129 Type 2 diabetes mellitus with other diabetic kidney complication: Secondary | ICD-10-CM | POA: Diagnosis not present

## 2019-10-18 DIAGNOSIS — Z992 Dependence on renal dialysis: Secondary | ICD-10-CM | POA: Diagnosis not present

## 2019-10-18 DIAGNOSIS — N186 End stage renal disease: Secondary | ICD-10-CM | POA: Diagnosis not present

## 2019-10-18 DIAGNOSIS — D509 Iron deficiency anemia, unspecified: Secondary | ICD-10-CM | POA: Diagnosis not present

## 2019-10-18 DIAGNOSIS — D631 Anemia in chronic kidney disease: Secondary | ICD-10-CM | POA: Diagnosis not present

## 2019-10-18 DIAGNOSIS — D689 Coagulation defect, unspecified: Secondary | ICD-10-CM | POA: Diagnosis not present

## 2019-10-18 DIAGNOSIS — N2581 Secondary hyperparathyroidism of renal origin: Secondary | ICD-10-CM | POA: Diagnosis not present

## 2019-10-19 ENCOUNTER — Other Ambulatory Visit: Payer: Self-pay | Admitting: Family

## 2019-10-19 ENCOUNTER — Ambulatory Visit (HOSPITAL_COMMUNITY): Payer: Medicare Other

## 2019-10-19 DIAGNOSIS — J9611 Chronic respiratory failure with hypoxia: Secondary | ICD-10-CM | POA: Diagnosis not present

## 2019-10-19 DIAGNOSIS — S21102D Unspecified open wound of left front wall of thorax without penetration into thoracic cavity, subsequent encounter: Secondary | ICD-10-CM | POA: Diagnosis not present

## 2019-10-19 DIAGNOSIS — L89322 Pressure ulcer of left buttock, stage 2: Secondary | ICD-10-CM | POA: Diagnosis not present

## 2019-10-19 DIAGNOSIS — L89152 Pressure ulcer of sacral region, stage 2: Secondary | ICD-10-CM | POA: Diagnosis not present

## 2019-10-19 DIAGNOSIS — N185 Chronic kidney disease, stage 5: Secondary | ICD-10-CM | POA: Diagnosis not present

## 2019-10-19 DIAGNOSIS — F411 Generalized anxiety disorder: Secondary | ICD-10-CM

## 2019-10-19 DIAGNOSIS — E1122 Type 2 diabetes mellitus with diabetic chronic kidney disease: Secondary | ICD-10-CM | POA: Diagnosis not present

## 2019-10-19 DIAGNOSIS — M6281 Muscle weakness (generalized): Secondary | ICD-10-CM | POA: Diagnosis not present

## 2019-10-20 DIAGNOSIS — E1129 Type 2 diabetes mellitus with other diabetic kidney complication: Secondary | ICD-10-CM | POA: Diagnosis not present

## 2019-10-20 DIAGNOSIS — N2581 Secondary hyperparathyroidism of renal origin: Secondary | ICD-10-CM | POA: Diagnosis not present

## 2019-10-20 DIAGNOSIS — D509 Iron deficiency anemia, unspecified: Secondary | ICD-10-CM | POA: Diagnosis not present

## 2019-10-20 DIAGNOSIS — D689 Coagulation defect, unspecified: Secondary | ICD-10-CM | POA: Diagnosis not present

## 2019-10-20 DIAGNOSIS — N186 End stage renal disease: Secondary | ICD-10-CM | POA: Diagnosis not present

## 2019-10-20 DIAGNOSIS — Z992 Dependence on renal dialysis: Secondary | ICD-10-CM | POA: Diagnosis not present

## 2019-10-20 DIAGNOSIS — D631 Anemia in chronic kidney disease: Secondary | ICD-10-CM | POA: Diagnosis not present

## 2019-10-21 DIAGNOSIS — G8194 Hemiplegia, unspecified affecting left nondominant side: Secondary | ICD-10-CM | POA: Diagnosis not present

## 2019-10-21 DIAGNOSIS — I279 Pulmonary heart disease, unspecified: Secondary | ICD-10-CM | POA: Diagnosis not present

## 2019-10-21 DIAGNOSIS — J449 Chronic obstructive pulmonary disease, unspecified: Secondary | ICD-10-CM | POA: Diagnosis not present

## 2019-10-23 ENCOUNTER — Telehealth: Payer: Self-pay | Admitting: Family

## 2019-10-23 DIAGNOSIS — N2581 Secondary hyperparathyroidism of renal origin: Secondary | ICD-10-CM | POA: Diagnosis not present

## 2019-10-23 DIAGNOSIS — N186 End stage renal disease: Secondary | ICD-10-CM | POA: Diagnosis not present

## 2019-10-23 DIAGNOSIS — E1129 Type 2 diabetes mellitus with other diabetic kidney complication: Secondary | ICD-10-CM | POA: Diagnosis not present

## 2019-10-23 DIAGNOSIS — D509 Iron deficiency anemia, unspecified: Secondary | ICD-10-CM | POA: Diagnosis not present

## 2019-10-23 DIAGNOSIS — Z992 Dependence on renal dialysis: Secondary | ICD-10-CM | POA: Diagnosis not present

## 2019-10-23 DIAGNOSIS — D689 Coagulation defect, unspecified: Secondary | ICD-10-CM | POA: Diagnosis not present

## 2019-10-23 DIAGNOSIS — D631 Anemia in chronic kidney disease: Secondary | ICD-10-CM | POA: Diagnosis not present

## 2019-10-23 NOTE — Telephone Encounter (Signed)
  REFERRAL REQUEST Telephone Note 10/23/2019  What type of referral do you need? Wound care  Have you been seen at our office for this problem? Yes he already has home health coming to home but wound getting worse (Advise that they may need an appointment with their PCP before a referral can be done)  Is there a particular doctor or location that you prefer? no  Patient notified that referrals can take up to a week or longer to process. If they haven't heard anything within a week they should call back and speak with the referral department.

## 2019-10-23 NOTE — Telephone Encounter (Signed)
The sacral bed sore have improved. They are about the size of a dime.  The two sores on the abdomen are not healing. Upper sore is black in color. Unsure if black is a scab or if it is that infected. The lower sore is smaller but oozing clear to yellow drainage.  Wife would like to know if a referral to wound care could be placed. Best if they could come to the home. If not, Tuesday's and Thursday's work best.  Also, checking on chest xray? Has in been ordered? Does Christy want labs. If so, the Home Health nurse will come out tomorrow and draw.  Looks like chest xray has been ordered?  Ok with referral to wound care or do you want to see first?  Does the patient need labs?

## 2019-10-24 ENCOUNTER — Ambulatory Visit (HOSPITAL_COMMUNITY)
Admission: RE | Admit: 2019-10-24 | Discharge: 2019-10-24 | Disposition: A | Discharge status: Home / Self Care | Payer: Medicare Other | Source: Ambulatory Visit | Attending: Cardiology | Admitting: Cardiology

## 2019-10-24 ENCOUNTER — Other Ambulatory Visit (HOSPITAL_COMMUNITY): Payer: Self-pay

## 2019-10-24 ENCOUNTER — Ambulatory Visit (HOSPITAL_COMMUNITY)
Admission: RE | Admit: 2019-10-24 | Discharge: 2019-10-24 | Disposition: A | Discharge status: Home / Self Care | Payer: Medicare Other | Source: Ambulatory Visit | Attending: Family | Admitting: Family

## 2019-10-24 ENCOUNTER — Other Ambulatory Visit (HOSPITAL_COMMUNITY): Payer: Self-pay | Admitting: Family

## 2019-10-24 ENCOUNTER — Other Ambulatory Visit: Payer: Medicare Other | Admitting: Internal Medicine

## 2019-10-24 ENCOUNTER — Other Ambulatory Visit: Payer: Self-pay

## 2019-10-24 DIAGNOSIS — I358 Other nonrheumatic aortic valve disorders: Secondary | ICD-10-CM | POA: Insufficient documentation

## 2019-10-24 DIAGNOSIS — E662 Morbid (severe) obesity with alveolar hypoventilation: Secondary | ICD-10-CM

## 2019-10-24 DIAGNOSIS — Z09 Encounter for follow-up examination after completed treatment for conditions other than malignant neoplasm: Secondary | ICD-10-CM

## 2019-10-24 DIAGNOSIS — E119 Type 2 diabetes mellitus without complications: Secondary | ICD-10-CM | POA: Insufficient documentation

## 2019-10-24 DIAGNOSIS — G4730 Sleep apnea, unspecified: Secondary | ICD-10-CM | POA: Insufficient documentation

## 2019-10-24 DIAGNOSIS — N186 End stage renal disease: Secondary | ICD-10-CM

## 2019-10-24 DIAGNOSIS — I3139 Other pericardial effusion (noninflammatory): Secondary | ICD-10-CM

## 2019-10-24 DIAGNOSIS — U071 COVID-19: Secondary | ICD-10-CM

## 2019-10-24 DIAGNOSIS — J9611 Chronic respiratory failure with hypoxia: Secondary | ICD-10-CM | POA: Diagnosis not present

## 2019-10-24 DIAGNOSIS — Z8616 Personal history of COVID-19: Secondary | ICD-10-CM | POA: Insufficient documentation

## 2019-10-24 DIAGNOSIS — I313 Pericardial effusion (noninflammatory): Secondary | ICD-10-CM | POA: Diagnosis not present

## 2019-10-24 DIAGNOSIS — E1122 Type 2 diabetes mellitus with diabetic chronic kidney disease: Secondary | ICD-10-CM | POA: Diagnosis not present

## 2019-10-24 DIAGNOSIS — Z515 Encounter for palliative care: Secondary | ICD-10-CM | POA: Diagnosis not present

## 2019-10-24 DIAGNOSIS — E785 Hyperlipidemia, unspecified: Secondary | ICD-10-CM | POA: Insufficient documentation

## 2019-10-24 DIAGNOSIS — M6281 Muscle weakness (generalized): Secondary | ICD-10-CM | POA: Diagnosis not present

## 2019-10-24 DIAGNOSIS — J1282 Pneumonia due to coronavirus disease 2019: Secondary | ICD-10-CM

## 2019-10-24 DIAGNOSIS — L89322 Pressure ulcer of left buttock, stage 2: Secondary | ICD-10-CM | POA: Diagnosis not present

## 2019-10-24 DIAGNOSIS — S21102D Unspecified open wound of left front wall of thorax without penetration into thoracic cavity, subsequent encounter: Secondary | ICD-10-CM | POA: Diagnosis not present

## 2019-10-24 DIAGNOSIS — L89152 Pressure ulcer of sacral region, stage 2: Secondary | ICD-10-CM | POA: Diagnosis not present

## 2019-10-24 DIAGNOSIS — N185 Chronic kidney disease, stage 5: Secondary | ICD-10-CM | POA: Diagnosis not present

## 2019-10-24 DIAGNOSIS — Z7189 Other specified counseling: Secondary | ICD-10-CM

## 2019-10-24 DIAGNOSIS — R0602 Shortness of breath: Secondary | ICD-10-CM | POA: Diagnosis not present

## 2019-10-24 NOTE — Progress Notes (Signed)
Echocardiogram 2D Echocardiogram has been performed.  [name] 10/24/2019, 10:01 AM

## 2019-10-25 ENCOUNTER — Encounter: Payer: Self-pay | Admitting: Internal Medicine

## 2019-10-25 DIAGNOSIS — D689 Coagulation defect, unspecified: Secondary | ICD-10-CM | POA: Diagnosis not present

## 2019-10-25 DIAGNOSIS — D631 Anemia in chronic kidney disease: Secondary | ICD-10-CM | POA: Diagnosis not present

## 2019-10-25 DIAGNOSIS — N186 End stage renal disease: Secondary | ICD-10-CM | POA: Diagnosis not present

## 2019-10-25 DIAGNOSIS — Z992 Dependence on renal dialysis: Secondary | ICD-10-CM | POA: Diagnosis not present

## 2019-10-25 DIAGNOSIS — D509 Iron deficiency anemia, unspecified: Secondary | ICD-10-CM | POA: Diagnosis not present

## 2019-10-25 DIAGNOSIS — E1129 Type 2 diabetes mellitus with other diabetic kidney complication: Secondary | ICD-10-CM | POA: Diagnosis not present

## 2019-10-25 DIAGNOSIS — N2581 Secondary hyperparathyroidism of renal origin: Secondary | ICD-10-CM | POA: Diagnosis not present

## 2019-10-25 NOTE — Progress Notes (Signed)
 April 27th, 2021 [facility] Consult Note Telephone: [phone]  Fax: [phone]   PATIENT NAME: [name] DOB: 09/27/1965 MRN: [id number] [address] [place] [address] (12 miles 20 min)   PRIMARY CARE PROVIDER:   Hawks, Christy A, FNP Dr [place] (Urology    REFERRING PROVIDER:  Hawks, Christy A, FNP [address] [place],  [place] [address]   Dialysis at [facility] in [place]  [facility] PT/OT/SN  visit  [name]  Kristen Hudy RN, Embedded Chronic Care Manager [facility] Care Management, [phone]  [facility] for wound care (started late April 2021)    RESPONSIBLE PARTY: (spouse) Amanda Ransier (hospital x-ray tech) [phone] [email or web address]   ASSESSMENT / RECOMMENDATIONS:  1. Advance Care Planning: A. Directives: Would desire full resuscitative efforts in the event of a cardiopulmonary arrest. B. Goals of Care: Initially patient was motivated to be able to stand and pivot to chair. But today, perhaps b/c he's tired from dialysis and in battling an enlarging wound on his L side, he's not particularly motivated. He has some bed exercises that were prescribed by PT and left for him, but he doesn't want to access these. I did discuss with patient the increasing his mobility would really help his spouse with his personal care.  -will revisit this on future visits. I discussed with spouse Amanda to see if she might be able to readdress this from time to time.    2. Symptom Management: A. Pain: (form precious note; reviewed) Continues over all arthritic pain concentrated in joints and lower back, now in buttocks and sacral area d/t pressure on his bottom, when in bed/chair. Continues Fentanyl patch 50mcg/hr managed by [name]. The clinic had talked about further decreasing his patch dose but haven't mentioned this his last few visits with them,  for which he is grateful. He has Hydrocodone Acetaminophen 7.5-325mg, which he continues to take 3-4 tabs/day.    B. Wounds: His wound care nurse from [facility] has just completed a dressing change; she reports L side wound markedly increased in size and worrisome for infection. She called patient's PCP for orders; scheduled for a home telehealth visit in 2 days. Patient had an appointment to establish with wound care clinic but unfortunately had to be canceled d/t conflict with another apt. Amanda rescheduled to next week. Patient now has a mattress gel pad overlay in place on his hospital bed. He is turning side to side q2h or so. Patient just finished a course of Doxycycline, last week.              3. Cognitive / Functional status: (from previous note; reviewed and updated) Patient is A & O x 3. Has an UE resting tremor which waxes and wanes in intensity. He takes Keppra for h/o seizures; none for years. H/O mood disorder well managed on current regimen. He is bed bound; electric bed. Hoyer lift to transfer to wheelchair. Dependent for assist with ADLs; able to feed himself. Transports to hemodialysis via handicap van driven by spouse. Height is 6'3" His current weight is 373lbs, a loss of 25 lbs over these last 2 months. Pt states he has lost about 85lbs of fluid since initiation of dialysis. At a height of 6'3" his BMI is 46.6kg/m2. He continues oxygen 3-4 LPM. Patient's foley cath was finally removed for good; minimal UO, about twice weekly, since.                 4. 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  Married 7 years to extremely supportive spouse, Amanda. Patient has a son age 29 (previous marriage).  Patient in good spirits with positive outlook. Feels so much better since initiation of dialysis "feel like I'm back from the dead".   5. Follow up Palliative Care Visit: Patient wishes to call me  on a prn basis.   I spent 60 minutes providing this consultation from 3pm to 4pm. More than 50% of the time in this consultation was spent coordinating communication.    HISTORY OF PRESENT ILLNESS:  Zerrick R Garno is a 53-year-old male with history of chronic kidney disease stage V (hemodialysis), chronic hypoxic respiratory failure on 3 L [place], OSA, OHS, diabetes mellitus type 2, DVT/PE (coumadin), hemorrhagic R kidney cyst (CT scan; conservative management finasteride/Foley), anemia of chronic disease/acute blood loss, mood disorder/bipolar (BuSpar, Cymbalta, Lamictal; psych f/u), seizure d/o (Keppra), chronic pain syndrome, morbid obesity, debility (WC bound baseline), mild confusion (sedative medications). 10/11-10/27/2020 hospitalized with COVID pneumonia. 11/10-11/16/2020 hospitalization 06/04/2019-06/08/2023 UTI complicated pyelonephritis/gross hematuria/inability to void/pain, acute on chronic CKD creatinine of 5.42.   12/13-07/03/2019 hospitalization. Initiated hemodialysis (06/15/2019). Tx'd 5 units PRBC. 06/19/19 L brachiocephalic arteriovenous fistula (Dr. Dickson). 09/12/2019  FISTULA  SUPERFICIALIZATION ON SECOND STAGE LEFT BRACHIOCEPHALIC ARTERIOVENOUS FISTULA (thought would be ready to use in 2 wks from this date) 10/23/2019: Echocardiogram: EF 55-6-%. No RWMA. Grade II diastolic dysfunction. No pericardial effusion.   This is a f/u Palliative Care Visit from 08/31/2019.    CODE STATUS: Full Code   PPS: 30%   HOSPICE ELIGIBILITY/DIAGNOSIS: TBD  PAST MEDICAL HISTORY:  Past Medical History:  Diagnosis Date  . Anemia   . Anxiety   . Arthritis   . Bipolar 1 disorder (HCC)    Denies  . Bronchitis    hx of  . Bruises easily   . Chronic pain syndrome 05/11/2012  . Chronic respiratory failure with hypoxia (HCC)    And with hypercapnia  . Constipation    chronic  . Diabetes mellitus without complication (HCC)   . Diabetic neuropathy (HCC)   . Dialysis patient (HCC)   . DVT  (deep venous thrombosis) (HCC)    LLE DVT ~ '12  . Dyspnea    with ambulation  . ESRD (end stage renal disease) (HCC)    MWF- [facility]  . GERD (gastroesophageal reflux disease)   . History of 2019 novel coronavirus disease (COVID-19)   . HOH (hard of hearing) 2015   has hearing aids  . Mental disorder   . Migraine   . Neuromuscular disorder (HCC)   . Obesity hypoventilation syndrome (HCC)   . Obstructive sleep apnea    no longer  . PE (pulmonary embolism)    bilateral PE ~ '11  . Seizures (HCC)   . Thrombocytopenia (HCC) 05/11/2012    SOCIAL HX:  Social History   Tobacco Use  . Smoking status: Never Smoker  . Smokeless tobacco: Former User    Types: Chew  Substance Use Topics  . Alcohol use: No    ALLERGIES:  Allergies  Allergen Reactions  . Bee Venom Anaphylaxis, Shortness Of Breath and Swelling  . Other Shortness Of Breath    Itching, rash with IVP DYE, iodine, shellfish LATEX  . Shellfish Allergy Nausea And Vomiting and Other (See Comments)    Feels like insides are twisting  . Iodinated Diagnostic Agents       Other reaction(s): RASH  . Iohexol      Code: RASH, Desc: PT WAS ON PREDNISONE FOR GOUT TX. @ TIME OF SCAN AND RECEIVED 50 MG OF BENADRYL IV-ARS 10/08/07   . Iodine Rash  . Latex Rash     PERTINENT MEDICATIONS:  Outpatient Encounter Medications as of 10/24/2019  Medication Sig  . albuterol (VENTOLIN HFA) 108 (90 Base) MCG/ACT inhaler TAKE 2 PUFFS BY MOUTH EVERY 6 HOURS AS NEEDED FOR WHEEZE OR SHORTNESS OF BREATH (Patient taking differently: Inhale 2 puffs into the lungs every 6 (six) hours as needed for wheezing or shortness of breath. )  . busPIRone (BUSPAR) 15 MG tablet TAKE 1 TABLET (15 MG TOTAL) BY MOUTH 2 (TWO) TIMES DAILY.  . calcium acetate (PHOSLO) 667 MG capsule Take 2 capsules (1,334 mg total) by mouth 3 (three) times daily with meals.  . cetirizine (ZYRTEC) 10 MG tablet TAKE 1 TABLET BY MOUTH EVERY DAY  . clomiPHENE (CLOMID) 50 MG tablet Take  0.5 tablets (25 mg total) by mouth every other day.  Marland Kitchen doxycycline (VIBRA-TABS) 100 MG tablet Take 1 tablet (100 mg total) by mouth 2 (two) times daily.  . DULoxetine (CYMBALTA) 60 MG capsule TAKE 1 CAPSULE BY MOUTH EVERY DAY  . EMGALITY 120 MG/ML SOAJ INJECT 120 MG INTO THE SKIN EVERY 30 (THIRTY) DAYS.  Marland Kitchen EPIPEN 2-PAK 0.3 MG/0.3ML SOAJ injection INJECT 0.3 MLS (0.3 MG TOTAL) INTO THE MUSCLE ONCE. AS NEEDED FOR ANAPHYLACTIC REACTION (Patient taking differently: Inject 0.3 mg into the muscle as needed for anaphylaxis. )  . esomeprazole (NEXIUM) 40 MG capsule TAKE 1 CAPSULE (40 MG TOTAL) BY MOUTH DAILY AT 12 NOON.  . fentaNYL (DURAGESIC) 50 MCG/HR Place 1 patch onto the skin every 3 (three) days.  . fluticasone (FLONASE) 50 MCG/ACT nasal spray PLACE 1 SPRAY INTO BOTH NOSTRILS 2 (TWO) TIMES DAILY AS NEEDED FOR ALLERGIES.  Marland Kitchen HYDROcodone-acetaminophen (NORCO) 7.5-325 MG tablet Take 1 tablet by mouth every 6 (six) hours as needed for moderate pain. (Patient taking differently: Take 1 tablet by mouth every 4 (four) hours as needed for moderate pain. )  . Insulin Glargine, 1 Unit Dial, (TOUJEO SOLOSTAR) 300 UNIT/ML SOPN Inject 45 Units into the skin 2 (two) times daily. (Patient taking differently: Inject 50 Units into the skin 2 (two) times daily. )  . Insulin Lispro (HUMALOG KWIKPEN [place]) Inject 30 Units into the skin 3 (three) times daily.  Marland Kitchen lamoTRIgine (LAMICTAL) 200 MG tablet TAKE 1 TABLET BY MOUTH TWICE A DAY  . levETIRAcetam (KEPPRA) 500 MG tablet Take 1 tablet (500 mg total) by mouth 2 (two) times daily.  . midodrine (PROAMATINE) 10 MG tablet Take 10 mg by mouth 3 (three) times daily.  . Misc. Devices (MATTRESS COVER) MISC bariatric gel overlay mattress  . NARCAN 4 MG/0.1ML LIQD nasal spray kit Place 1 spray into the nose once.   . polyethylene glycol (MIRALAX / GLYCOLAX) 17 g packet Take 17 g by mouth 2 (two) times daily. (Patient taking differently: Take 17 g by mouth daily as needed for mild  constipation or moderate constipation. )  . pregabalin (LYRICA) 50 MG capsule Take 1 capsule (50 mg total) by mouth 2 (two) times daily.  . rizatriptan (MAXALT) 10 MG tablet Take 1 tablet (10 mg total) by mouth as needed. May repeat in 2 hours if needed  . senna (SENOKOT) 8.6 MG TABS tablet Take 1 tablet (8.6 mg total) by mouth at bedtime.  . vitamin C (VITAMIN C) 500 MG  tablet Take 1 tablet (500 mg total) by mouth daily.  . Vitamin D, Ergocalciferol, (DRISDOL) 1.25 MG (50000 UT) CAPS capsule Take 50,000 Units by mouth once a week. Monday  . warfarin (COUMADIN) 3 MG tablet TAKE 1 TABLET BY MOUTH DAILY  . zinc sulfate 220 (50 Zn) MG capsule Take 1 capsule (220 mg total) by mouth 2 (two) times daily.  . [DISCONTINUED] potassium chloride (K-DUR) 10 MEQ tablet Take 3 tablets (30 mEq total) by mouth daily.   No facility-administered encounter medications on file as of 10/24/2019.    PHYSICAL EXAM:   General: NAD, frail appearing, thin Cardiovascular: regular rate and rhythm Pulmonary: clear ant fields Abdomen: soft, nontender, + bowel sounds GU: no suprapubic tenderness Extremities: no edema, no joint deformities Skin: no rashes Neurological: Weakness but otherwise nonfocal  Julianne Handler, NP

## 2019-10-26 ENCOUNTER — Other Ambulatory Visit: Payer: Self-pay | Admitting: Family

## 2019-10-26 ENCOUNTER — Telehealth (INDEPENDENT_AMBULATORY_CARE_PROVIDER_SITE_OTHER): Payer: Medicare Other | Admitting: Family

## 2019-10-26 ENCOUNTER — Telehealth: Payer: Self-pay | Admitting: *Deleted

## 2019-10-26 ENCOUNTER — Encounter: Payer: Self-pay | Admitting: Family

## 2019-10-26 ENCOUNTER — Ambulatory Visit: Payer: Medicare Other | Admitting: Cardiology

## 2019-10-26 DIAGNOSIS — L89322 Pressure ulcer of left buttock, stage 2: Secondary | ICD-10-CM | POA: Diagnosis not present

## 2019-10-26 DIAGNOSIS — J810 Acute pulmonary edema: Secondary | ICD-10-CM | POA: Diagnosis not present

## 2019-10-26 DIAGNOSIS — S21102D Unspecified open wound of left front wall of thorax without penetration into thoracic cavity, subsequent encounter: Secondary | ICD-10-CM | POA: Diagnosis not present

## 2019-10-26 DIAGNOSIS — Z86718 Personal history of other venous thrombosis and embolism: Secondary | ICD-10-CM

## 2019-10-26 DIAGNOSIS — M6281 Muscle weakness (generalized): Secondary | ICD-10-CM | POA: Diagnosis not present

## 2019-10-26 DIAGNOSIS — M549 Dorsalgia, unspecified: Secondary | ICD-10-CM | POA: Diagnosis not present

## 2019-10-26 DIAGNOSIS — Z79899 Other long term (current) drug therapy: Secondary | ICD-10-CM | POA: Diagnosis not present

## 2019-10-26 DIAGNOSIS — J9611 Chronic respiratory failure with hypoxia: Secondary | ICD-10-CM | POA: Diagnosis not present

## 2019-10-26 DIAGNOSIS — T148XXA Other injury of unspecified body region, initial encounter: Secondary | ICD-10-CM

## 2019-10-26 DIAGNOSIS — Z7901 Long term (current) use of anticoagulants: Secondary | ICD-10-CM

## 2019-10-26 DIAGNOSIS — L8990 Pressure ulcer of unspecified site, unspecified stage: Secondary | ICD-10-CM | POA: Diagnosis not present

## 2019-10-26 DIAGNOSIS — L89152 Pressure ulcer of sacral region, stage 2: Secondary | ICD-10-CM | POA: Diagnosis not present

## 2019-10-26 DIAGNOSIS — Z86711 Personal history of pulmonary embolism: Secondary | ICD-10-CM

## 2019-10-26 DIAGNOSIS — IMO0002 Reserved for concepts with insufficient information to code with codable children: Secondary | ICD-10-CM

## 2019-10-26 DIAGNOSIS — G894 Chronic pain syndrome: Secondary | ICD-10-CM | POA: Diagnosis not present

## 2019-10-26 DIAGNOSIS — N185 Chronic kidney disease, stage 5: Secondary | ICD-10-CM | POA: Diagnosis not present

## 2019-10-26 DIAGNOSIS — E1122 Type 2 diabetes mellitus with diabetic chronic kidney disease: Secondary | ICD-10-CM | POA: Diagnosis not present

## 2019-10-26 DIAGNOSIS — E1165 Type 2 diabetes mellitus with hyperglycemia: Secondary | ICD-10-CM

## 2019-10-26 DIAGNOSIS — Z6841 Body Mass Index (BMI) 40.0 and over, adult: Secondary | ICD-10-CM

## 2019-10-26 MED ORDER — PEN NEEDLES 32G X 6 MM MISC: 1.0000 "application " | Freq: Two times a day (BID) | 2 refills | Status: AC

## 2019-10-26 MED ORDER — DOXYCYCLINE HYCLATE 100 MG PO TABS: 100.0000 mg | ORAL_TABLET | Freq: Two times a day (BID) | ORAL | 0 refills | Status: DC

## 2019-10-26 NOTE — Progress Notes (Signed)
Virtual Visit via telephone Note Due to COVID-19 pandemic this visit was conducted virtually. This visit type was conducted due to national recommendations for restrictions regarding the COVID-19 Pandemic (e.g. social distancing, sheltering in place) in an effort to limit this patient's exposure and mitigate transmission in our community. All issues noted in this document were discussed and addressed.  A physical exam was not performed with this format.  I connected with [name] on 10/26/19 at 10:53 AM  by video and verified that I am speaking with the correct person using two identifiers. [name] is currently located at home and nurse and wife is currently with him during visit. The provider, Evelina Dun, FNP is located in their office at time of visit.  I discussed the limitations, risks, security and privacy concerns of performing an evaluation and management service by telephone and the availability of in person appointments. I also discussed with the patient that there may be a patient responsible charge related to this service. The patient expressed understanding and agreed to proceed.   History and Present Illness:  HPI Pt calls with his wife and nurse with complaints of two wounds on left side/flank. Wife reports the area started about 6 weeks ago. Top would is  4.5X10.5 and the lower wound is approx 6X9. The nurse reports the area around both wound approx 12 inc is very hard.   The top wound is draining a clear to yellow discharge. The bottom wound is not draining and is black.   Denies any fever, pain, or warmth. States the top wound is painful with dressing changes. Doing the dressing changes BID week.   He has appointment with wound care next week.   His INR was 3.2. See anticoagulant flow sheet. He takes warfarin daily   Review of Systems  Skin:       wound  All other systems reviewed and are negative.    Observations/Objective: Pt looks weak, laying  bed, wounds look large , Top wound is draining clear, bottom wound center is black. Both wounds look large.   Assessment and Plan: 1. Postoperative pulmonary edema (HCC) Description   INR today was 3.2(too thin) , goal 2.0-3.0   Hold today's dose 10/26/19, then decrease to 3 mg to everyday.    Recheck INR in 1 weeks.        2. History of DVT (deep vein thrombosis)  3. Morbid obesity with BMI of 60.0-69.9, adult ([place])  4. History of pulmonary embolism  5. Anticoagulated on Coumadin  6. Wound of skin Keep wound care appt [name] wife states since stopping the doxycycline the bottom wound has worsen significantly. I have reordered this to start if fevers, redness or warmth devolps Continue dressing change      I discussed the assessment and treatment plan with the patient. The patient was provided an opportunity to ask questions and all were answered. The patient agreed with the plan and demonstrated an understanding of the instructions.   The patient was advised to call back or seek an in-person evaluation if the symptoms worsen or if the condition fails to improve as anticipated.  The above assessment and management plan was discussed with the patient. The patient verbalized understanding of and has agreed to the management plan. Patient is aware to call the clinic if symptoms persist or worsen. Patient is aware when to return to the clinic for a follow-up visit. Patient educated on when it is appropriate to go to the  emergency department.   Time call ended:  11:20 AM  I provided 27 minutes of face-to-face time during this encounter.    Evelina Dun, FNP

## 2019-10-26 NOTE — Telephone Encounter (Signed)
See note

## 2019-10-26 NOTE — Telephone Encounter (Signed)
Patient had appointment today. Has appointment with wound care next week.

## 2019-10-26 NOTE — Telephone Encounter (Signed)
Fax received mdINR PT/INR self testing service Test date/time 10/24/19 124pm INR 3.2

## 2019-10-27 DIAGNOSIS — E1129 Type 2 diabetes mellitus with other diabetic kidney complication: Secondary | ICD-10-CM | POA: Diagnosis not present

## 2019-10-27 DIAGNOSIS — D631 Anemia in chronic kidney disease: Secondary | ICD-10-CM | POA: Diagnosis not present

## 2019-10-27 DIAGNOSIS — J9611 Chronic respiratory failure with hypoxia: Secondary | ICD-10-CM | POA: Diagnosis not present

## 2019-10-27 DIAGNOSIS — N2581 Secondary hyperparathyroidism of renal origin: Secondary | ICD-10-CM | POA: Diagnosis not present

## 2019-10-27 DIAGNOSIS — D509 Iron deficiency anemia, unspecified: Secondary | ICD-10-CM | POA: Diagnosis not present

## 2019-10-27 DIAGNOSIS — Z992 Dependence on renal dialysis: Secondary | ICD-10-CM | POA: Diagnosis not present

## 2019-10-27 DIAGNOSIS — N12 Tubulo-interstitial nephritis, not specified as acute or chronic: Secondary | ICD-10-CM | POA: Diagnosis not present

## 2019-10-27 DIAGNOSIS — E1122 Type 2 diabetes mellitus with diabetic chronic kidney disease: Secondary | ICD-10-CM | POA: Diagnosis not present

## 2019-10-27 DIAGNOSIS — N186 End stage renal disease: Secondary | ICD-10-CM | POA: Diagnosis not present

## 2019-10-27 DIAGNOSIS — D689 Coagulation defect, unspecified: Secondary | ICD-10-CM | POA: Diagnosis not present
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
## 2019-10-30 ENCOUNTER — Other Ambulatory Visit: Payer: Self-pay

## 2019-10-30 ENCOUNTER — Encounter (HOSPITAL_BASED_OUTPATIENT_CLINIC_OR_DEPARTMENT_OTHER): Payer: Medicare Other | Attending: Internal Medicine | Admitting: Internal Medicine

## 2019-10-30 ENCOUNTER — Other Ambulatory Visit (HOSPITAL_BASED_OUTPATIENT_CLINIC_OR_DEPARTMENT_OTHER): Payer: Self-pay | Admitting: Internal Medicine

## 2019-10-30 DIAGNOSIS — N186 End stage renal disease: Secondary | ICD-10-CM | POA: Diagnosis not present

## 2019-10-30 DIAGNOSIS — S31104A Unspecified open wound of abdominal wall, left lower quadrant without penetration into peritoneal cavity, initial encounter: Secondary | ICD-10-CM | POA: Diagnosis not present

## 2019-10-30 DIAGNOSIS — N185 Chronic kidney disease, stage 5: Secondary | ICD-10-CM | POA: Insufficient documentation

## 2019-10-30 DIAGNOSIS — L98498 Non-pressure chronic ulcer of skin of other sites with other specified severity: Secondary | ICD-10-CM | POA: Diagnosis not present

## 2019-10-30 DIAGNOSIS — Z6841 Body Mass Index (BMI) 40.0 and over, adult: Secondary | ICD-10-CM | POA: Diagnosis not present

## 2019-10-30 DIAGNOSIS — D631 Anemia in chronic kidney disease: Secondary | ICD-10-CM | POA: Diagnosis not present

## 2019-10-30 DIAGNOSIS — S2190XD Unspecified open wound of unspecified part of thorax, subsequent encounter: Secondary | ICD-10-CM | POA: Insufficient documentation

## 2019-10-30 DIAGNOSIS — Z992 Dependence on renal dialysis: Secondary | ICD-10-CM | POA: Insufficient documentation

## 2019-10-30 DIAGNOSIS — S31104D Unspecified open wound of abdominal wall, left lower quadrant without penetration into peritoneal cavity, subsequent encounter: Secondary | ICD-10-CM | POA: Diagnosis not present

## 2019-10-30 DIAGNOSIS — Z7901 Long term (current) use of anticoagulants: Secondary | ICD-10-CM | POA: Diagnosis not present

## 2019-10-30 DIAGNOSIS — L03311 Cellulitis of abdominal wall: Secondary | ICD-10-CM | POA: Diagnosis not present

## 2019-10-30 DIAGNOSIS — D509 Iron deficiency anemia, unspecified: Secondary | ICD-10-CM | POA: Diagnosis not present

## 2019-10-30 DIAGNOSIS — Z86711 Personal history of pulmonary embolism: Secondary | ICD-10-CM | POA: Diagnosis not present

## 2019-10-30 DIAGNOSIS — E11622 Type 2 diabetes mellitus with other skin ulcer: Secondary | ICD-10-CM | POA: Insufficient documentation

## 2019-10-30 DIAGNOSIS — S2190XA Unspecified open wound of unspecified part of thorax, initial encounter: Secondary | ICD-10-CM | POA: Diagnosis not present

## 2019-10-30 DIAGNOSIS — D689 Coagulation defect, unspecified: Secondary | ICD-10-CM | POA: Diagnosis not present

## 2019-10-30 DIAGNOSIS — E1122 Type 2 diabetes mellitus with diabetic chronic kidney disease: Secondary | ICD-10-CM | POA: Insufficient documentation

## 2019-10-30 DIAGNOSIS — Z86718 Personal history of other venous thrombosis and embolism: Secondary | ICD-10-CM | POA: Diagnosis not present

## 2019-10-30 DIAGNOSIS — L98499 Non-pressure chronic ulcer of skin of other sites with unspecified severity: Secondary | ICD-10-CM | POA: Diagnosis not present

## 2019-10-30 DIAGNOSIS — I120 Hypertensive chronic kidney disease with stage 5 chronic kidney disease or end stage renal disease: Secondary | ICD-10-CM | POA: Diagnosis not present

## 2019-10-30 DIAGNOSIS — N2581 Secondary hyperparathyroidism of renal origin: Secondary | ICD-10-CM | POA: Diagnosis not present

## 2019-10-30 NOTE — Progress Notes (Signed)
ARA, MANO ([id number]) Visit Report for 10/30/2019 Allergy List Details Patient Name: Date of Service: Patrick, [name] [place] 10/30/2019 9:00 [place] Record [name]: [id number] Patient Account [name]: [id number] Date of Birth/Sex: Treating RN: 06/09/1966 (54 y.o. [name]) Carlene Coria Primary Care [name]: [name] Other Clinician: Referring [name]: Treating [name]/Extender: [name] in Treatment: 0 Allergies Active Allergies shellfish derived latex iodine Reaction: rash , itching bee venom protein (honey bee) Allergy Notes Electronic Signature(s) Signed: 10/30/2019 5:02:18 PM By: Carlene Coria RN Entered By: Carlene Coria on 10/30/2019 10:01:04 -------------------------------------------------------------------------------- Arrival Information Details Patient Name: Date of Service: [name] [place] R. 10/30/2019 9:00 [place] Record [name]: [id number] Patient Account [name]: [id number] Date of Birth/Sex: Treating RN: 06/27/1966 (54 y.o. [name] Primary Care Keondrick Dilks: [name] Other Clinician: Referring Gabryel Files: Treating [name]/Extender: [name] in Treatment: 0 Visit Information Patient Arrived: Wheel Chair Arrival Time: 09:20 Accompanied By: wife Transfer Assistance: None Patient Identification Verified: Yes Secondary Verification Process Completed: Yes Patient Requires Transmission-Based Precautions: No Patient Has Alerts: Yes Patient Alerts: Patient on Blood Thinner Electronic Signature(s) Signed: 10/30/2019 5:02:18 PM By: Carlene Coria RN Entered By: Carlene Coria on 10/30/2019 10:00:40 -------------------------------------------------------------------------------- Clinic Level of Care Assessment Details Patient Name: Date of Service: [name] [place] R. 10/30/2019 9:00 A M Medical Record [name]: [id number] Patient Account [name]: [id number] Date of Birth/Sex: Treating RN: 08/23/1965  (54 y.o. [name] Primary Care Brownie Gockel: [name] Other Clinician: Referring [name]: Treating Briena Swingler/Extender: [name] in Treatment: 0 Clinic Level of Care Assessment Items TOOL 1 Quantity Score X- 1 0 Use when EandM and Procedure is performed on INITIAL visit ASSESSMENTS - Nursing Assessment / Reassessment X- 1 20 General Physical Exam (combine w/ comprehensive assessment (listed just below) when performed on new pt. evals) X- 1 25 Comprehensive Assessment (HX, ROS, Risk Assessments, Wounds Hx, etc.) ASSESSMENTS - Wound and Skin Assessment / Reassessment []  - 0 Dermatologic / Skin Assessment (not related to wound area) ASSESSMENTS - Ostomy and/or Continence Assessment and Care []  - 0 Incontinence Assessment and Management []  - 0 Ostomy Care Assessment and Management (repouching, etc.) PROCESS - Coordination of Care []  - 0 Simple Patient / Family Education for ongoing care X- 1 20 Complex (extensive) Patient / Family Education for ongoing care X- 1 10 Staff obtains Programmer, systems, Records, T Results / Process Orders est X- 1 10 Staff telephones HHA, Nursing Homes / Clarify orders / etc []  - 0 Routine Transfer to another Facility (non-emergent condition) []  - 0 Routine Hospital Admission (non-emergent condition) X- 1 15 New Admissions / Biomedical engineer / Ordering NPWT Apligraf, etc. , []  - 0 Emergency Hospital Admission (emergent condition) PROCESS - Special Needs []  - 0 Pediatric / Minor Patient Management []  - 0 Isolation Patient Management []  - 0 Hearing / Language / Visual special needs []  - 0 Assessment of Community assistance (transportation, D/C planning, etc.) []  - 0 Additional assistance / Altered mentation []  - 0 Support Surface(s) Assessment (bed, cushion, seat, etc.) INTERVENTIONS - Miscellaneous []  - 0 External ear exam []  - 0 Patient Transfer (multiple staff / Civil Service fast streamer / Similar devices) []  -  0 Simple Staple / Suture removal (25 or less) []  - 0 Complex Staple / Suture removal (26 or more) []  - 0 Hypo/Hyperglycemic Management (do not check if billed separately) []  - 0 Ankle / Brachial Index (ABI) - do not check if billed separately Has the patient  been seen at the hospital within the last three years: Yes Total Score: 100 Level Of Care: New/Established - Level 3 Electronic Signature(s) Signed: 10/30/2019 5:27:06 PM By: Patrick Hurst RN, BSN Signed: 10/30/2019 5:27:06 PM By: Patrick Hurst RN, BSN Entered By: [name] on 10/30/2019 12:30:34 -------------------------------------------------------------------------------- Encounter Discharge Information Details Patient Name: Date of Service: Patrick, [name] [facility] [place] R. 10/30/2019 9:00 A M Medical Record [name]: [id number] Patient Account [name]: [id number] Date of Birth/Sex: Treating RN: 12/21/1965 (54 y.o. [name] Primary Care Makailey Hodgkin: [name] Other Clinician: Referring Chyane Greer: Treating Ovie Eastep/Extender: [name] in Treatment: 0 Encounter Discharge Information Items Post Procedure Vitals Discharge Condition: Stable Temperature (F): 98.2 Ambulatory Status: Wheelchair Pulse (bpm): 76 Discharge Destination: Home Respiratory Rate (breaths/min): 20 Transportation: Private Auto Blood Pressure (mmHg): 100/65 Accompanied By: wife Schedule Follow-up Appointment: Yes Clinical Summary of Care: Electronic Signature(s) Signed: 10/30/2019 4:26:09 PM By: Deon Pilling Entered By: Deon Pilling on 10/30/2019 14:08:39 -------------------------------------------------------------------------------- Multi Wound Chart Details Patient Name: Date of Service: [name] [place] R. 10/30/2019 9:00 A M Medical Record [name]: [id number] Patient Account [name]: [id number] Date of Birth/Sex: Treating RN: [birth year] (54 y.o. [name] Primary Care Allisha Harter: [name] Other  Clinician: Referring Mykell Rawl: Treating Nykiah Ma/Extender: [name] in Treatment: 0 Vital Signs Height(in): 75 Pulse(bpm): 76 Weight(lbs): 093 Blood Pressure(mmHg): 100/55 Body Mass Index(BMI): 47 Temperature(F): 98.2 Respiratory Rate(breaths/min): 20 Photos: [1:No Photos Left, Proximal Flank] [2:No Photos Left, Distal Flank] [N/A:N/A N/A] Wound Location: [1:Gradually Appeared] [2:Gradually Appeared] [N/A:N/A] Wounding Event: [1:Cellulitis] [2:Cellulitis] [N/A:N/A] Primary Etiology: [1:Anemia, Type II Diabetes,] [2:Anemia, Type II Diabetes,] [N/A:N/A] Comorbid History: [1:Osteoarthritis 08/14/2019] [2:Osteoarthritis 08/14/2019] [N/A:N/A] Date Acquired: [1:0] [2:0] [N/A:N/A] [name] of Treatment: [1:Open] [2:Open] [N/A:N/A] Wound Status: [1:2.3x10x0.1] [2:6x9x0.1] [N/A:N/A] Measurements L x W x D (cm) [1:18.064] [2:42.412] [N/A:N/A] A (cm) : rea [1:1.806] [2:4.241] [N/A:N/A] Volume (cm) : [1:Full Thickness Without Exposed] [2:Full Thickness Without Exposed] [N/A:N/A] Classification: [1:Support Structures Medium] [2:Support Structures Medium] [N/A:N/A] Exudate Amount: [1:Serosanguineous] [2:Serosanguineous] [N/A:N/A] Exudate Type: [1:red, brown] [2:red, brown] [N/A:N/A] Exudate Color: [1:Small (1-33%)] [2:Small (1-33%)] [N/A:N/A] Granulation Amount: [1:N/A] [2:Red] [N/A:N/A] Granulation Quality: [1:Large (67-100%)] [2:Large (67-100%)] [N/A:N/A] Necrotic Amount: [1:Adherent Slough] [2:Eschar, Adherent Slough] [N/A:N/A] Necrotic Tissue: [1:Fat Layer (Subcutaneous Tissue)] [2:Fat Layer (Subcutaneous Tissue)] [N/A:N/A] Exposed Structures: [1:Exposed: Yes Fascia: No Tendon: No Muscle: No Joint: No Bone: No None] [2:Exposed: Yes Fascia: No Tendon: No Muscle: No Joint: No Bone: No None] [N/A:N/A] Epithelialization: [1:N/A] [2:Biopsy] [N/A:N/A] Treatment Notes Electronic Signature(s) Signed: 10/30/2019 5:27:06 PM By: Patrick Hurst RN, BSN Signed: 10/30/2019  5:33:20 PM By: Linton Ham MD Entered By: Linton Ham on 10/30/2019 12:12:49 -------------------------------------------------------------------------------- [place] Details Patient Name: Date of Service: Patrick, [name] [facility] [place] R. 10/30/2019 9:00 A M Medical Record [name]: [id number] Patient Account [name]: [id number] Date of Birth/Sex: Treating RN: 06/21/1966 (54 y.o. [name] Primary Care Gyasi Hazzard: [name] Other Clinician: Referring Benjiman Sedgwick: Treating Kensey Luepke/Extender: [name] in Treatment: 0 Active Inactive Abuse / Safety / Falls / Self Care Management Nursing Diagnoses: Potential for falls Potential for injury related to falls Goals: Patient will not experience any injury related to falls Date Initiated: 10/30/2019 Target Resolution Date: 12/01/2019 Goal Status: Active Patient/caregiver will verbalize/demonstrate measures taken to prevent injury and/or falls Date Initiated: 10/30/2019 Target Resolution Date: 12/01/2019 Goal Status: Active Interventions: Assess Activities of Daily Living upon admission and as needed Assess fall risk on admission and as needed Assess: immobility, friction, shearing, incontinence upon admission and as needed Assess  impairment of mobility on admission and as needed per policy Assess personal safety and home safety (as indicated) on admission and as needed Assess self care needs on admission and as needed Provide education on fall prevention Provide education on personal and home safety Notes: Nutrition Nursing Diagnoses: Impaired glucose control: actual or potential Potential for alteratiion in Nutrition/Potential for imbalanced nutrition Goals: Patient/caregiver agrees to and verbalizes understanding of need to use nutritional supplements and/or vitamins as prescribed Date Initiated: 10/30/2019 Target Resolution Date: 12/01/2019 Goal Status: Active Patient/caregiver will maintain  therapeutic glucose control Date Initiated: 10/30/2019 Target Resolution Date: 12/01/2019 Goal Status: Active Interventions: Assess HgA1c results as ordered upon admission and as needed Assess patient nutrition upon admission and as needed per policy Provide education on elevated blood sugars and impact on wound healing Provide education on nutrition Notes: Wound/Skin Impairment Nursing Diagnoses: Impaired tissue integrity Knowledge deficit related to ulceration/compromised skin integrity Goals: Patient/caregiver will verbalize understanding of skin care regimen Date Initiated: 10/30/2019 Target Resolution Date: 12/01/2019 Goal Status: Active Interventions: Assess patient/caregiver ability to obtain necessary supplies Assess patient/caregiver ability to perform ulcer/skin care regimen upon admission and as needed Assess ulceration(s) every visit Provide education on ulcer and skin care Notes: Electronic Signature(s) Signed: 10/30/2019 5:27:06 PM By: Patrick Hurst RN, BSN Entered By: [name] on 10/30/2019 10:39:03 -------------------------------------------------------------------------------- Pain Assessment Details Patient Name: Date of Service: [place], [name] 10/30/2019 9:00 [place] Record [name]: [id number] Patient Account [name]: [id number] Date of Birth/Sex: Treating RN: Sep 25, 1965 (54 y.o. [name] Primary Care Sharlene Mccluskey: [name] Other Clinician: Referring Shontez Sermon: Treating Aadith Raudenbush/Extender: [name] in Treatment: 0 Active Problems Location of Pain Severity and Description of Pain Patient Has Paino Yes Site Locations With Dressing Change: Yes Duration of the Pain. Constant / Intermittento Constant Rate the pain. Current Pain Level: 4 Worst Pain Level: 7 Least Pain Level: 2 Tolerable Pain Level: 5 Character of Pain Describe the Pain: Burning, Dull, Stabbing Pain Management and Medication Current Pain  Management: Medication: No Cold Application: No Rest: No Massage: No Activity: No T.E.N.S.: No Heat Application: No Leg drop or elevation: No Is the Current Pain Management Adequate: Inadequate How does your wound impact your activities of daily livingo Sleep: No Bathing: No Appetite: No Relationship With Others: No Bladder Continence: No Emotions: No Bowel Continence: No Work: No Toileting: No Drive: No Dressing: No Hobbies: No Electronic Signature(s) Signed: 10/30/2019 5:02:18 PM By: Carlene Coria RN Entered By: Carlene Coria on 10/30/2019 09:54:57 -------------------------------------------------------------------------------- Patient/Caregiver Education Details Patient Name: Date of Service: Claudette Laws 5/3/2021andnbsp9:00 A M Medical Record [name]: [id number] Patient Account [name]: [id number] Date of Birth/Gender: Treating RN: 1966/06/02 (54 y.o. [name] Primary Care Physician: [name] Other Clinician: Referring Physician: Treating Physician/Extender: [name] in Treatment: 0 Education Assessment Education Provided To: Patient Education Topics Provided Elevated Blood Sugar/ Impact on Healing: Methods: Explain/Verbal Responses: State content correctly Nutrition: Methods: Explain/Verbal Responses: State content correctly Safety: Methods: Explain/Verbal Responses: State content correctly Wound/Skin Impairment: Methods: Explain/Verbal Responses: State content correctly Electronic Signature(s) Signed: 10/30/2019 5:27:06 PM By: Patrick Hurst RN, BSN Entered By: [name] on 10/30/2019 10:39:37 -------------------------------------------------------------------------------- Wound Assessment Details Patient Name: Date of Service: Patrick [place], [name] 10/30/2019 9:00 [place] Record [name]: [id number] Patient Account [name]: [id number] Date of Birth/Sex: Treating RN: 1966-02-27 (54 y.o. [name] Primary Care Jermain Curt: [name] Other Clinician: Referring Kemiyah Tarazon: Treating Isabele Lollar/Extender: Cletis [place], Sherian Rein  in Treatment: 0 Wound Status Wound [name]: 1 Primary Etiology: Cellulitis Wound Location: Left, Proximal Flank Wound Status: Open Wounding Event: Gradually Appeared Comorbid History: Anemia, Type II Diabetes, Osteoarthritis Date Acquired: 08/14/2019 [name] Of Treatment: 0 Clustered Wound: No Wound Measurements Length: (cm) 2.3 Width: (cm) 10 Depth: (cm) 0.1 Area: (cm) 18.064 Volume: (cm) 1.806 % Reduction in Area: % Reduction in Volume: Epithelialization: None Tunneling: No Undermining: No Wound Description Classification: Full Thickness Without Exposed Support Structures Exudate Amount: Medium Exudate Type: Serosanguineous Exudate Color: red, brown Foul Odor After Cleansing: No Slough/Fibrino Yes Wound Bed Granulation Amount: Small (1-33%) Exposed Structure Necrotic Amount: Large (67-100%) Fascia Exposed: No Necrotic Quality: Adherent Slough Fat Layer (Subcutaneous Tissue) Exposed: Yes Tendon Exposed: No Muscle Exposed: No Joint Exposed: No Bone Exposed: No Treatment Notes Wound #1 (Left, Proximal Flank) 1. Cleanse With Wound Cleanser 3. Primary Dressing Applied Calcium Alginate Ag 4. Secondary Dressing ABD Pad Dry Gauze 5. Secured With [employer] [facility] [employer]) Signed: 10/30/2019 5:02:18 PM By: Carlene Coria RN Entered By: Carlene Coria on 10/30/2019 09:51:11 -------------------------------------------------------------------------------- Wound Assessment Details Patient Name: Date of Service: Patrick, [name] [place] R. 10/30/2019 9:00 A M Medical Record [name]: [id number] Patient Account [name]: [id number] Date of Birth/Sex: Treating RN: [birth year] (54 y.o. [name]) Carlene Coria Primary Care Terrell Shimko: [name] Other Clinician: Referring Ziza Hastings: Treating Nansi [place]/Extender: [name] in Treatment: 0 Wound Status Wound [name]: 2 Primary Etiology: Cellulitis Wound Location: Left, Distal Flank Wound Status: Open Wounding Event: Gradually Appeared Comorbid History: Anemia, Type II Diabetes, Osteoarthritis Date Acquired: 08/14/2019 [name] Of Treatment: 0 Clustered Wound: No Wound Measurements Length: (cm) 6 Width: (cm) 9 Depth: (cm) 0.1 Area: (cm) 42.412 Volume: (cm) 4.241 % Reduction in Area: % Reduction in Volume: Epithelialization: None Tunneling: No Undermining: No Wound Description Classification: Full Thickness Without Exposed Support Structures Exudate Amount: Medium Exudate Type: Serosanguineous Exudate Color: red, brown Foul Odor After Cleansing: No Slough/Fibrino Yes Wound Bed Granulation Amount: Small (1-33%) Exposed Structure Granulation Quality: Red Fascia Exposed: No Necrotic Amount: Large (67-100%) Fat Layer (Subcutaneous Tissue) Exposed: Yes Necrotic Quality: Eschar, Adherent Slough Tendon Exposed: No Muscle Exposed: No Joint Exposed: No Bone Exposed: No Electronic Signature(s) Signed: 10/30/2019 5:02:18 PM By: Carlene Coria RN Entered By: Carlene Coria on 10/30/2019 09:53:02 -------------------------------------------------------------------------------- Vitals Details Patient Name: Date of Service: [place] 10/30/2019 9:00 A M Medical Record [name]: [id number] Patient Account [name]: [id number] Date of Birth/Sex: Treating RN: 30-Jun-1965 (54 y.o. [name]) Carlene Coria Primary Care Maxen Rowland: [name] Other Clinician: Referring Shemia Bevel: Treating Dorothyann Mourer/Extender: [name] in Treatment: 0 Vital Signs Time Taken: 09:21 Temperature (F): 98.2 Height (in): 75 Pulse (bpm): 76 Source: Stated Respiratory Rate (breaths/min): 20 Weight (lbs): 373 Blood Pressure (mmHg): 100/55 Source: Stated Reference Range: 80 - 120 mg / dl Body Mass Index (BMI): 46.6 Electronic  Signature(s) Signed: 10/30/2019 5:02:18 PM By: Carlene Coria RN Entered By: Carlene Coria on 10/30/2019 10:00:45

## 2019-10-30 NOTE — Progress Notes (Signed)
[name] ([id number]) Visit Report for 10/30/2019 Abuse/Suicide Risk Screen Details Patient Name: Date of Service: [name], [name]. 10/30/2019 9:00 [place] Record Number: [id number] Patient Account Number: [id number] Date of Birth/Sex: Treating RN: Jul 19, 1965 (54 y.o. [name]) Carlene Coria Primary Care Moniqua Engebretsen: Evelina Dun Other Clinician: Referring Laconya Clere: Treating Shaquon Gropp/Extender: [name] Weeks in Treatment: 0 Abuse/Suicide Risk Screen Items Answer ABUSE RISK SCREEN: Has anyone close to you tried to hurt or harm you recentlyo No Do you feel uncomfortable with anyone in your familyo No Has anyone forced you do things that you didnt want to doo No Electronic Signature(s) Signed: 10/30/2019 5:02:18 PM By: Carlene Coria RN Entered By: Carlene Coria on 10/30/2019 09:37:00 -------------------------------------------------------------------------------- Activities of Daily Living Details Patient Name: Date of Service: [name] [name]. 10/30/2019 9:00 [place] Record Number: [id number] Patient Account Number: [id number] Date of Birth/Sex: Treating RN: 10/27/1965 (54 y.o. [name]) Carlene Coria Primary Care Jariah Jarmon: Evelina Dun Other Clinician: Referring Ahuva Poynor: Treating [name]/Extender: [name] Weeks in Treatment: 0 Activities of Daily Living Items Answer Activities of Daily Living (Please select one for each item) Drive Automobile Not Able T Medications ake Completely Able Use T elephone Completely Able Care for Appearance Need Assistance Use T oilet Not Able Bath / Shower Not Able Dress Self Not Able Feed Self Completely Able Walk Not Able Get In / Out Bed Not Able Housework Not Able Prepare Meals Not Able Handle Money Not Able Shop for Self Not Able Electronic Signature(s) Signed: 10/30/2019 5:02:18 PM By: Carlene Coria RN Entered By: Carlene Coria on 10/30/2019  09:37:39 -------------------------------------------------------------------------------- Education Screening Details Patient Name: Date of Service: [name] [name]. 10/30/2019 9:00 [name] Record Number: [id number] Patient Account Number: [id number] Date of Birth/Sex: Treating RN: 1965/11/06 (54 y.o. [name] Primary Care Abigial Newville: Evelina Dun Other Clinician: Referring Chelby Salata: Treating [name]/Extender: [name] in Treatment: 0 Primary Learner Assessed: Patient Learning Preferences/Education Level/Primary Language Learning Preference: Explanation Highest Education Level: College or Above Preferred Language: English Cognitive Barrier Language Barrier: No Translator Needed: No Memory Deficit: No Emotional Barrier: No Cultural/Religious Beliefs Affecting Medical Care: No Physical Barrier Impaired Vision: No Impaired Hearing: No Decreased Hand dexterity: No Knowledge/Comprehension Knowledge Level: Medium Comprehension Level: High Ability to understand written instructions: High Ability to understand verbal instructions: High Motivation Anxiety Level: Calm Cooperation: Cooperative Education Importance: Acknowledges Need Interest in Health Problems: Asks Questions Perception: Coherent Willingness to Engage in Self-Management High Activities: Readiness to Engage in Self-Management High Activities: Electronic Signature(s) Signed: 10/30/2019 5:02:18 PM By: Carlene Coria RN Entered By: Carlene Coria on 10/30/2019 09:38:10 -------------------------------------------------------------------------------- Fall Risk Assessment Details Patient Name: Date of Service: [name] 10/30/2019 9:00 [place] Record Number: [id number] Patient Account Number: [id number] Date of Birth/Sex: Treating RN: 1966-01-11 (54 y.o. [name]) Carlene Coria Primary Care [name]: Evelina Dun Other Clinician: Referring Siennah Barrasso: Treating  Wyonia Fontanella/Extender: [name] Weeks in Treatment: 0 Fall Risk Assessment Items Have you had 2 or more falls in the last 12 monthso 0 No Have you had any fall that resulted in injury in the last 12 monthso 0 No FALLS RISK SCREEN History of falling - immediate or within 3 months 0 No Secondary diagnosis (Do you have 2 or more medical diagnoseso) 0 No Ambulatory aid None/bed rest/wheelchair/nurse 0 No Crutches/cane/walker 0 No Furniture 0 No Intravenous therapy Access/Saline/Heparin Lock 0 No Gait/Transferring Normal/ bed rest/ wheelchair 0 No Weak (  short steps with or without shuffle, stooped but able to lift head while walking, may seek 0 No support from furniture) Impaired (short steps with shuffle, may have difficulty arising from chair, head down, impaired 0 No balance) Mental Status Oriented to own ability 0 No Electronic Signature(s) Signed: 10/30/2019 5:02:18 PM By: Carlene Coria RN Entered By: Carlene Coria on 10/30/2019 09:38:33 -------------------------------------------------------------------------------- Nutrition Risk Screening Details Patient Name: Date of Service: [name] 10/30/2019 9:00 [name] Record Number: [id number] Patient Account Number: [id number] Date of Birth/Sex: Treating RN: 08/04/1965 (54 y.o. [name]) Carlene Coria Primary Care [name]: Evelina Dun Other Clinician: Referring [name]: Treating [name]/Extender: [name] Weeks in Treatment: 0 Height (in): 75 Weight (lbs): 373 Body Mass Index (BMI): 46.6 Nutrition Risk Screening Items Score Screening NUTRITION RISK SCREEN: I have an illness or condition that made me change the kind and/or amount of food I eat 0 No I eat fewer than two meals per day 0 No I eat few fruits and vegetables, or milk products 0 No I have three or more drinks of beer, liquor or wine almost every day 0 No I have tooth or mouth problems that make it hard for me to eat 0  No I don't always have enough money to buy the food I need 0 No I eat alone most of the time 0 No I take three or more different prescribed or over-the-counter drugs a day 1 Yes Without wanting to, I have lost or gained 10 pounds in the last six months 0 No I am not always physically able to shop, cook and/or feed myself 2 Yes Nutrition Protocols Good Risk Protocol Moderate Risk Protocol 0 Provide education on nutrition High Risk Proctocol Risk Level: Moderate Risk Score: 3 Electronic Signature(s) Signed: 10/30/2019 5:02:18 PM By: Carlene Coria RN Entered By: Carlene Coria on 10/30/2019 09:39:31

## 2019-10-30 NOTE — Progress Notes (Signed)
TYR, FRANCA ([id number]) Visit Report for 10/30/2019 Biopsy Details Patient Name: Date of Service: [name], [name] [facility] [place] R. 10/30/2019 9:00 A M Medical Record Number: [id number] Patient Account Number: [id number] Date of Birth/Sex: Treating RN: [birth year] (54 y.o. [name] Primary Care Provider: Evelina [name] Other Clinician: Referring Provider: Treating Provider/Extender: [name] in Treatment: 0 Biopsy Performed for: Wound #2 Left, Lateral Abdomen - Lower Quadrant Location(s): Wound Margin Performed By: Physician [name]., MD Tissue Punch: Yes Size (mm): 4 Number of Specimens T aken: 1 Specimen Sent T Pathology: o Yes Level of Consciousness (Pre-procedure): Awake and Alert Pre-procedure Verification/Time-Out Taken: Yes - 10:48 Pain Control: Lidocaine Injectable Lidocaine Percent: 1% Instrument: Forceps Bleeding: Moderate Hemostasis Achieved: Silver Nitrate Procedural Pain: 0 Post Procedural Pain: 0 Response to Treatment: Procedure was tolerated well Level of Consciousness (Post-procedure): Awake and Alert Post Procedure Diagnosis Same as Pre-procedure Electronic Signature(s) Signed: 10/30/2019 5:33:20 PM By: Patrick Ham MD Entered By: [name] on 10/30/2019 12:12:58 -------------------------------------------------------------------------------- Chief Complaint Document Details Patient Name: Date of Service: [name], Patrick [place] R. 10/30/2019 9:00 A M Medical Record Number: [id number] Patient Account Number: [id number] Date of Birth/Sex: Treating RN: 1965/12/04 (54 y.o. [name] Primary Care Provider: Evelina [name] Other Clinician: Referring Provider: Treating Provider/Extender: [name] in Treatment: 0 Information Obtained from: Patient Chief Complaint 10/30/2019; patient is here for review of 2 areas on the left lateral abdomen and left lateral thorax Electronic  Signature(s) Signed: 10/30/2019 5:33:20 PM By: Patrick Ham MD Entered By: [name] on 10/30/2019 12:13:26 -------------------------------------------------------------------------------- HPI Details Patient Name: Date of Service: [name] [place] R. 10/30/2019 9:00 A M Medical Record Number: [id number] Patient Account Number: [id number] Date of Birth/Sex: Treating RN: Oct 28, 1965 (54 y.o. [name] Primary Care Provider: Evelina [name] Other Clinician: Referring Provider: Treating Provider/Extender: [name] in Treatment: 0 History of Present Illness HPI Description: Admission 10/30/2019 This is a 54 year old very disabled man with multiple medical issues. 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 His problems apparently started roughly 4 months ago s he noted expanding painful areas on the left lateral abdominal wall and the left lateral thorax. These were very painful and necrotic and have been expanding. It is really deteriorated for him in October when he had Covid and had to be hospitalized. This caused deterioration in his respiratory function including chronic oxygen at 3 to 4 L, onset of dialysis and weight loss of 60 pounds. Most recently has home health of implying silver alginate to these expanding wounds. He has been on 2 courses of doxycycline in early April and then once last week given to him by his primary doctor who had a telehealth visit. He is here for review of these issues. 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 Past medical history includes type 2 diabetes well controlled with a recent hemoglobin A1c of 6.6, chronic O2 requirements, morbid obesity, Coumadin secondary to history of DVT and PE, stage V  chronic renal failure, BPH, seizure disorder although none recently, left AV fistula, he was seen by palliative care on 4/27 noted to be immobile with widespread arthritic pain high O2 requirements. He is however full code and I did not discuss advanced treatment issues Electronic Signature(s) Signed:  10/30/2019 5:33:20 PM By: Patrick Ham MD Entered By: [name] on 10/30/2019 12:20:10 -------------------------------------------------------------------------------- Physical Exam Details Patient Name: Date of Service: [name], Patrick [place] R. 10/30/2019 9:00 A M Medical Record Number: [id number] Patient Account Number: [id number] Date of Birth/Sex: Treating RN: [birth year] (54 y.o. [name] Primary Care Provider: Evelina [name] Other Clinician: Referring Provider: Treating Provider/Extender: [name] in Treatment: 0 Constitutional Sitting or standing Blood Pressure is within target range for patient.. Pulse regular and within target range for patient.Marland Kitchen Respirations regular, non-labored and within target range. On high flow oxygen.[name] emperature is normal and within the target range for the patient.Marland Kitchen Appears in no distress. Morbid obesity somewhat breathless. Respiratory work of breathing is normal. Shallow air entry but no adventitious sounds no wheezing. Cardiovascular Soft midsystolic murmur 1 out of 6 no S3 jugular venous pressure not elevated. Gastrointestinal (GI) Abdomen is obese, nontender no overt masses. Integumentary (Hair, Skin) Outside of the wound issues he has an erythematous area on the left anterior thigh angulated area of erythema very firm underneath. Also tender. Psychiatric appears at normal baseline. Notes Wound exam Her area is on the left lateral abdominal wall. Large area of tissue necrosis with some surrounding erythema and firmness to palpation. Very tender. I anesthetized the small area with 2% lidocaine I did a punch  biopsy for pathology. The bleeding was profuse which took [place] a long time with multiple silver nitrates, Gelfoam and perhaps 40 minutes of direct pressure overall to get this to stop. This will not be reattempted in this clinic. He has a large linear horizontal area in the left lateral thorax at probably an area just below the left breast. Again necrotic surface tissue. No current evidence of infection. Electronic Signature(s) Signed: 10/30/2019 5:33:20 PM By: Patrick Ham MD Entered By: [name] on 10/30/2019 12:25:39 -------------------------------------------------------------------------------- Physician Orders Details Patient Name: Date of Service: [name], [name] [facility] [place] R. 10/30/2019 9:00 A M Medical Record Number: [id number] Patient Account Number: [id number] Date of Birth/Sex: Treating RN: 1965/08/13 (54 y.o. [name] Primary Care Provider: Evelina [name] Other Clinician: Referring Provider: Treating Provider/Extender: [name] in Treatment: 0 Verbal / Phone Orders: No Diagnosis Coding Follow-up Appointments Return Appointment in 1 week. Dressing Change Frequency Wound #1 Left,Proximal Flank Change Dressing every other day. Wound #2 Left,Lateral Abdomen - Lower Quadrant Change Dressing every other day. Wound Cleansing Wound #1 Left,Proximal Flank Clean wound with Wound Cleanser - or normal saline Wound #2 Left,Lateral Abdomen - Lower Quadrant Clean wound with Wound Cleanser - or normal saline Primary Wound Dressing Wound #1 Left,Proximal Flank Calcium Alginate with Silver Wound #2 Left,Lateral Abdomen - Lower Quadrant Calcium Alginate with Silver Secondary Dressing Wound #1 Left,Proximal Flank Dry Gauze ABD pad - secure with tape Wound #2 Left,Lateral Abdomen - Lower Quadrant Dry Gauze ABD pad - secure with tape [place] skilled nursing for wound care. - [facility] Bacteria identified in  Tissue by Biopsy culture (MICRO) - left lateral abdomen LOINC Code: [id number] Convenience Name: Biopsy specimen culture Electronic Signature(s) Signed: 10/30/2019 5:27:06 PM By: Levan Hurst RN, BSN Signed: 10/30/2019 5:33:20 PM By: Patrick Ham MD Entered By: Levan Hurst on 10/30/2019 10:52:25 -------------------------------------------------------------------------------- Problem List Details Patient Name: Date of Service: [name], [name] [facility] [place] R. 10/30/2019 9:00 A M Medical Record Number: [id number] Patient Account Number: [id number] Date of Birth/Sex: Treating RN: 07/23/1965 (54 y.o. [name] Primary Care Provider: Evelina [name] Other Clinician: Referring Provider:  Treating Provider/Extender: Cletis [place], Alyse Low [name] in Treatment: 0 Active Problems ICD-10 Encounter Code Description Active Date MDM Diagnosis S31.104D Unspecified open wound of abdominal wall, left lower quadrant without 10/30/2019 No Yes penetration into peritoneal cavity, subsequent encounter L98.498 Non-pressure chronic ulcer of skin of other sites with other specified severity 10/30/2019 No Yes S21.90XD Unspecified open wound of unspecified part of thorax, subsequent encounter 10/30/2019 No Yes E83.59 Other disorders of calcium metabolism 10/30/2019 No Yes Inactive Problems Resolved Problems Electronic Signature(s) Signed: 10/30/2019 5:33:20 PM By: Patrick Ham MD Entered By: [name] on 10/30/2019 11:54:16 -------------------------------------------------------------------------------- Progress Note Details Patient Name: Date of Service: [name] [place] R. 10/30/2019 9:00 A M Medical Record Number: [id number] Patient Account Number: [id number] Date of Birth/Sex: Treating RN: [birth year] (54 y.o. [name] Primary Care Provider: Evelina [name] Other Clinician: Referring Provider: Treating Provider/Extender: [name] in Treatment: 0 Subjective Chief  Complaint Information obtained from Patient 10/30/2019; patient is here for review of 2 areas on the left lateral abdomen and left lateral thorax History of Present Illness (HPI) Admission 10/30/2019 This is a 54 year old very disabled man with multiple medical issues. 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 His problems apparently started roughly 4 months ago s he noted expanding painful areas on the left lateral abdominal wall and the left lateral thorax. These were very painful and necrotic and have been expanding. It is really deteriorated for him in October when he had Covid and had to be hospitalized. This caused deterioration in his respiratory function including chronic oxygen at 3 to 4 L, onset of dialysis and weight loss of 60 pounds. Most recently has home health of implying silver alginate to these expanding wounds. He has been on 2 courses of doxycycline in early April and then once last week given to him by his primary doctor who had a telehealth visit. He is here for review of these issues. 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 Past medical history includes type 2 diabetes well controlled with a recent hemoglobin A1c of 6.6, chronic O2 requirements, morbid obesity, Coumadin secondary to history of DVT and PE, stage V chronic renal failure, BPH, seizure disorder although none recently, left AV fistula, he was seen by palliative care on 4/27 noted to be immobile with widespread arthritic pain high O2 requirements. He is however full code and I did not discuss advanced treatment issues Patient History Information obtained from Patient. Allergies shellfish derived, latex, iodine (Reaction: rash , itching), bee venom protein (honey bee) Family History Cancer  - Mother, Diabetes - Father, Stroke - Paternal Grandparents, Thyroid Problems - Mother, No family history of Heart Disease, Hereditary Spherocytosis, Hypertension, Kidney Disease, Lung Disease, Seizures, Tuberculosis. Social History Never smoker, Marital Status - Married, Alcohol Use - Never, Drug Use - No History, Caffeine Use - Daily. Medical History Eyes Denies history of Cataracts, Glaucoma, Optic Neuritis Ear/Nose/Mouth/Throat Denies history of Chronic sinus problems/congestion, Middle ear problems Hematologic/Lymphatic Patient has history of Anemia Denies history of Hemophilia, Human Immunodeficiency Virus, Lymphedema, Sickle Cell Disease Respiratory Denies history of Aspiration, Asthma, Chronic Obstructive Pulmonary Disease (COPD), Pneumothorax, Sleep Apnea, Tuberculosis Cardiovascular Denies history of Angina, Arrhythmia, Congestive Heart Failure, Coronary Artery Disease, Deep Vein Thrombosis, Hypertension, Hypotension, Myocardial Infarction, Peripheral Arterial Disease, Peripheral Venous Disease,  Phlebitis, Vasculitis Gastrointestinal Denies history of Cirrhosis , Colitis, Crohnoos, Hepatitis A, Hepatitis B, Hepatitis C Endocrine Patient has history of Type II Diabetes Denies history of Type I Diabetes Genitourinary Denies history of End Stage Renal Disease Immunological Denies history of Lupus Erythematosus, Raynaudoos, Scleroderma Integumentary (Skin) Denies history of History of Burn Musculoskeletal Patient has history of Osteoarthritis Denies history of Gout, Rheumatoid Arthritis, Osteomyelitis Neurologic Denies history of Dementia, Neuropathy, Quadriplegia, Paraplegia, Seizure Disorder Oncologic Denies history of Received Chemotherapy, Received Radiation Psychiatric Denies history of Anorexia/bulimia, Confinement Anxiety Patient is treated with Insulin. Blood sugar is not tested. Review of Systems (ROS) Constitutional Symptoms (General Health) Denies complaints  or symptoms of Fatigue, Fever, Chills, Marked Weight Change. Eyes Denies complaints or symptoms of Dry Eyes, Vision Changes, Glasses / Contacts. Ear/Nose/Mouth/Throat Denies complaints or symptoms of Chronic sinus problems or rhinitis. Respiratory Denies complaints or symptoms of Chronic or frequent coughs, Shortness of Breath. Cardiovascular Denies complaints or symptoms of Chest pain. Gastrointestinal Denies complaints or symptoms of Frequent diarrhea, Nausea, Vomiting. Endocrine Denies complaints or symptoms of Heat/cold intolerance. Genitourinary Denies complaints or symptoms of Frequent urination. Integumentary (Skin) Complains or has symptoms of Wounds. Musculoskeletal Denies complaints or symptoms of Muscle Pain, Muscle Weakness. Neurologic Denies complaints or symptoms of Numbness/parasthesias. Psychiatric Denies complaints or symptoms of Claustrophobia, Suicidal. Objective Constitutional Sitting or standing Blood Pressure is within target range for patient.. Pulse regular and within target range for patient.Marland Kitchen Respirations regular, non-labored and within target range. On high flow oxygen.[name] emperature is normal and within the target range for the patient.Marland Kitchen Appears in no distress. Morbid obesity somewhat breathless. Vitals Time Taken: 9:21 AM, Height: 75 in, Source: Stated, Weight: 373 lbs, Source: Stated, BMI: 46.6, Temperature: 98.2 F, Pulse: 76 bpm, Respiratory Rate: 20 breaths/min, Blood Pressure: 100/55 mmHg. Respiratory work of breathing is normal. Shallow air entry but no adventitious sounds no wheezing. Cardiovascular Soft midsystolic murmur 1 out of 6 no S3 jugular venous pressure not elevated. Gastrointestinal (GI) Abdomen is obese, nontender no overt masses. Psychiatric appears at normal baseline. General Notes: Wound exam ooHer area is on the left lateral abdominal wall. Large area of tissue necrosis with some surrounding erythema and firmness  to palpation. Very tender. I anesthetized the small area with 2% lidocaine I did a punch biopsy for pathology. The bleeding was profuse which took [place] a long time with multiple silver nitrates, Gelfoam and perhaps 40 minutes of direct pressure overall to get this to stop. This will not be reattempted in this clinic. ooHe has a large linear horizontal area in the left lateral thorax at probably an area just below the left breast. Again necrotic surface tissue. No current evidence of infection. Integumentary (Hair, Skin) Outside of the wound issues he has an erythematous area on the left anterior thigh angulated area of erythema very firm underneath. Also tender. Wound #1 status is Open. Original cause of wound was Gradually Appeared. The wound is located on the Left,Proximal Flank. The wound measures 2.3cm length x 10cm width x 0.1cm depth; 18.064cm^2 area and 1.806cm^3 volume. There is Fat Layer (Subcutaneous Tissue) Exposed exposed. There is no tunneling or undermining noted. There is a medium amount of serosanguineous drainage noted. There is small (1-33%) granulation within the wound bed. There is a large (67-100%) amount of necrotic tissue within the wound bed including Adherent Slough. Wound #2 status is Open. Original cause of wound was Gradually Appeared. The wound is located on the Left,Lateral Abdomen - Lower Quadrant. The wound  measures 6cm length x 9cm width x 0.1cm depth; 42.412cm^2 area and 4.241cm^3 volume. There is Fat Layer (Subcutaneous Tissue) Exposed exposed. There is no tunneling or undermining noted. There is a medium amount of serosanguineous drainage noted. There is small (1-33%) red granulation within the wound bed. There is a large (67-100%) amount of necrotic tissue within the wound bed including Eschar and Adherent Slough. Assessment Active Problems ICD-10 Unspecified open wound of abdominal wall, left lower quadrant without penetration into peritoneal cavity, subsequent  encounter Non-pressure chronic ulcer of skin of other sites with other specified severity Unspecified open wound of unspecified part of thorax, subsequent encounter Other disorders of calcium metabolism Procedures Wound #2 Pre-procedure diagnosis of Wound #2 is a Cellulitis located on the Left, Lateral Abdomen - Lower Quadrant . There was a biopsy performed by [name]., MD. There was a biopsy performed on Wound Margin. The skin was cleansed and prepped with anti-septic followed by pain control using Lidocaine Injectable: 1%. Utilizing a 4 mm tissue punch, tissue was removed at its base with the following instrument(s): Forceps and sent to pathology. A Moderate amount of bleeding was controlled with Silver Nitrate. A time out was conducted at 10:48, prior to the start of the procedure. The procedure was tolerated well with a pain level of 0 throughout and a pain level of 0 following the procedure. Post procedure Diagnosis Wound #2: Same as Pre-Procedure Plan Follow-up Appointments: Return Appointment in 1 week. Dressing Change Frequency: Wound #1 Left,Proximal Flank: Change Dressing every other day. Wound #2 Left,Lateral Abdomen - Lower Quadrant: Change Dressing every other day. Wound Cleansing: Wound #1 Left,Proximal Flank: Clean wound with Wound Cleanser - or normal saline Wound #2 Left,Lateral Abdomen - Lower Quadrant: Clean wound with Wound Cleanser - or normal saline Primary Wound Dressing: Wound #1 Left,Proximal Flank: Calcium Alginate with Silver Wound #2 Left,Lateral Abdomen - Lower Quadrant: Calcium Alginate with Silver Secondary Dressing: Wound #1 Left,Proximal Flank: Dry Gauze ABD pad - secure with tape Wound #2 Left,Lateral Abdomen - Lower Quadrant: Dry Gauze ABD pad - secure with tape Home Health: [facility] skilled nursing for wound care. - [facility] ordered were: Biopsy specimen culture - left lateral abdomen #1 extensive necrotic  wounds on the left lateral abdomen and left lateral thorax. Very painful there is firm palpable tissue even around these large areas did suggest more tissue involvement. 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 1 would have to consider some form of panniculitis. I am doubtful that this is warfarin skin necrosis as he has been on this for years although I would not be opposed to changing this to a direct inhibitor 2. The bleeding after 1 punch biopsy was extensive. Beyond the INR which was 3.2 last week and he had a reduction in his Coumadin I think this probably has to do with severe venous hypertension which in turn is probably secondary to right heart issues 3. Chronic renal failure on dialysis which was recently initiated after his stay at [facility] in the fall with Covid. His primary nephrologist is Dr. Moshe Cipro. I will discussed this with her. From my point of view I think it would be reasonable to try to initiate empiric therapy here even if my biopsy was not in the right area to show calciphylaxis. I certainly will not be doing another punch biopsy in this clinic. Whether any dermatologist have cautery I am not sure.  I spent 55 minutes in review of this patient's record, face-to-face evaluation and preparation of this note. Electronic Signature(s) Signed: 10/30/2019 5:33:20 PM By: Patrick Ham MD Entered By: [name] on 10/30/2019 12:29:59 -------------------------------------------------------------------------------- HxROS Details Patient Name: Date of Service: [name], Patrick [place] R. 10/30/2019 9:00 [place] Record Number: [id number] Patient Account Number: [id number] Date of Birth/Sex: Treating RN: 09-23-1965 (54 y.o. [name] Primary Care Provider: Evelina [name] Other Clinician: Referring Provider: Treating Provider/Extender: [name] in Treatment:  0 Information Obtained From Patient Constitutional Symptoms (General Health) Complaints and Symptoms: Negative for: Fatigue; Fever; Chills; Marked Weight Change Eyes Complaints and Symptoms: Negative for: Dry Eyes; Vision Changes; Glasses / Contacts Medical History: Negative for: Cataracts; Glaucoma; Optic Neuritis Ear/Nose/Mouth/Throat Complaints and Symptoms: Negative for: Chronic sinus problems or rhinitis Medical History: Negative for: Chronic sinus problems/congestion; Middle ear problems Respiratory Complaints and Symptoms: Negative for: Chronic or frequent coughs; Shortness of Breath Medical History: Negative for: Aspiration; Asthma; Chronic Obstructive Pulmonary Disease (COPD); Pneumothorax; Sleep Apnea; Tuberculosis Cardiovascular Complaints and Symptoms: Negative for: Chest pain Medical History: Negative for: Angina; Arrhythmia; Congestive Heart Failure; Coronary Artery Disease; Deep Vein Thrombosis; Hypertension; Hypotension; Myocardial Infarction; Peripheral Arterial Disease; Peripheral Venous Disease; Phlebitis; Vasculitis Gastrointestinal Complaints and Symptoms: Negative for: Frequent diarrhea; Nausea; Vomiting Medical History: Negative for: Cirrhosis ; Colitis; Crohns; Hepatitis A; Hepatitis B; Hepatitis C Endocrine Complaints and Symptoms: Negative for: Heat/cold intolerance Medical History: Positive for: Type II Diabetes Negative for: Type I Diabetes Time with diabetes: 12 years Treated with: Insulin Blood sugar tested every day: No Genitourinary Complaints and Symptoms: Negative for: Frequent urination Medical History: Negative for: End Stage Renal Disease Integumentary (Skin) Complaints and Symptoms: Positive for: Wounds Medical History: Negative for: History of Burn Musculoskeletal Complaints and Symptoms: Negative for: Muscle Pain; Muscle Weakness Medical History: Positive for: Osteoarthritis Negative for: Gout; Rheumatoid Arthritis;  Osteomyelitis Neurologic Complaints and Symptoms: Negative for: Numbness/parasthesias Medical History: Negative for: Dementia; Neuropathy; Quadriplegia; Paraplegia; Seizure Disorder Psychiatric Complaints and Symptoms: Negative for: Claustrophobia; Suicidal Medical History: Negative for: Anorexia/bulimia; Confinement Anxiety Hematologic/Lymphatic Medical History: Positive for: Anemia Negative for: Hemophilia; Human Immunodeficiency Virus; Lymphedema; Sickle Cell Disease Immunological Medical History: Negative for: Lupus Erythematosus; Raynauds; Scleroderma Oncologic Medical History: Negative for: Received Chemotherapy; Received Radiation Immunizations Pneumococcal Vaccine: Received Pneumococcal Vaccination: No Implantable Devices None Family and Social History Cancer: Yes - Mother; Diabetes: Yes - Father; Heart Disease: No; Hereditary Spherocytosis: No; Hypertension: No; Kidney Disease: No; Lung Disease: No; Seizures: No; Stroke: Yes - Paternal Grandparents; Thyroid Problems: Yes - Mother; Tuberculosis: No; Never smoker; Marital Status - Married; Alcohol Use: Never; Drug Use: No History; Caffeine Use: Daily; Financial Concerns: No; Food, Clothing or Shelter Needs: No; Support System Lacking: No; Transportation Concerns: No Electronic Signature(s) Signed: 10/30/2019 5:02:18 PM By: Carlene Coria RN Signed: 10/30/2019 5:33:20 PM By: Patrick Ham MD Entered By: Carlene Coria on 10/30/2019 09:36:39 -------------------------------------------------------------------------------- SuperBill Details Patient Name: Date of Service: [name], [place] R. 10/30/2019 Medical Record Number: [id number] Patient Account Number: [id number] Date of Birth/Sex: Treating RN: [birth year] (54 y.o. 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 Facility Procedures CPT4: Code 16109604 992 Description: [place] VISIT-LEV 3 EST PT Modifier: 25 Quantity: 1 CPT4: 54098119 111 Description: 04-Punch biopsy of skin (including simple closure, when performed) single lesion ICD-10 Diagnosis Description S31.104D Unspecified open wound of abdominal wall, left lower quadrant without penetration into per encounter Modifier: itoneal cavity, Quantity: 1 subsequent Physician Procedures : CPT4 Code Description Modifier [id number] - WC PHYS LEVEL 4 - NEW PT 25 ICD-10 Diagnosis Description S31.104D Unspecified open wound of abdominal wall, left lower quadrant without penetration into peritoneal cavity encounter L98.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

## 2019-10-31 ENCOUNTER — Other Ambulatory Visit: Payer: Self-pay

## 2019-10-31 ENCOUNTER — Encounter: Payer: Self-pay | Admitting: Cardiology

## 2019-10-31 ENCOUNTER — Ambulatory Visit (INDEPENDENT_AMBULATORY_CARE_PROVIDER_SITE_OTHER): Payer: Medicare Other | Admitting: Cardiology

## 2019-10-31 VITALS — BP 88/55 | HR 84 | Ht 75.0 in | Wt 372.0 lb

## 2019-10-31 DIAGNOSIS — N186 End stage renal disease: Secondary | ICD-10-CM | POA: Diagnosis not present

## 2019-10-31 DIAGNOSIS — I313 Pericardial effusion (noninflammatory): Secondary | ICD-10-CM

## 2019-10-31 DIAGNOSIS — I3139 Other pericardial effusion (noninflammatory): Secondary | ICD-10-CM

## 2019-10-31 DIAGNOSIS — R0602 Shortness of breath: Secondary | ICD-10-CM | POA: Diagnosis not present

## 2019-10-31 DIAGNOSIS — I318 Other specified diseases of pericardium: Secondary | ICD-10-CM

## 2019-10-31 MED ORDER — PREDNISONE 50 MG PO TABS: ORAL_TABLET | ORAL | 0 refills | Status: AC

## 2019-10-31 MED ORDER — DIPHENHYDRAMINE HCL 50 MG PO TABS: ORAL_TABLET | ORAL | 0 refills | Status: AC

## 2019-10-31 NOTE — Patient Instructions (Signed)
Medication Instructions:  Continue same medications *If you need a refill on your cardiac medications before your next appointment, please call your pharmacy*   Lab Work: None ordered    Testing/Procedures: Schedule Chest CT at [facility] office ( Take Prednisone as prescribed before chest ct )   Follow-Up: At [facility], you and your health needs are our priority.  As part of our continuing mission to provide you with exceptional heart care, we have created designated Provider Care Teams.  These Care Teams include your primary Cardiologist (physician) and Advanced Practice Providers (APPs -  Physician Assistants and Nurse Practitioners) who all work together to provide you with the care you need, when you need it.  We recommend signing up for the patient portal called "MyChart".  Sign up information is provided on this After Visit Summary.  MyChart is used to connect with patients for Virtual Visits (Telemedicine).  Patients are able to view lab/test results, encounter notes, upcoming appointments, etc.  Non-urgent messages can be sent to your provider as well.   To learn more about what you can do with MyChart, go to [email or web address].    Your next appointment:  To be determined after test   The format for your next appointment: Office   Provider: Dr.Jordan

## 2019-11-01 DIAGNOSIS — N186 End stage renal disease: Secondary | ICD-10-CM | POA: Diagnosis not present

## 2019-11-01 DIAGNOSIS — D631 Anemia in chronic kidney disease: Secondary | ICD-10-CM | POA: Diagnosis not present

## 2019-11-01 DIAGNOSIS — Z992 Dependence on renal dialysis: Secondary | ICD-10-CM | POA: Diagnosis not present

## 2019-11-01 DIAGNOSIS — D689 Coagulation defect, unspecified: Secondary | ICD-10-CM | POA: Diagnosis not present

## 2019-11-01 DIAGNOSIS — N2581 Secondary hyperparathyroidism of renal origin: Secondary | ICD-10-CM | POA: Diagnosis not present

## 2019-11-01 DIAGNOSIS — D509 Iron deficiency anemia, unspecified: Secondary | ICD-10-CM | POA: Diagnosis not present

## 2019-11-02 ENCOUNTER — Encounter (HOSPITAL_BASED_OUTPATIENT_CLINIC_OR_DEPARTMENT_OTHER): Payer: Medicare Other | Admitting: Internal Medicine

## 2019-11-02 DIAGNOSIS — S21102D Unspecified open wound of left front wall of thorax without penetration into thoracic cavity, subsequent encounter: Secondary | ICD-10-CM | POA: Diagnosis not present

## 2019-11-02 DIAGNOSIS — L89152 Pressure ulcer of sacral region, stage 2: Secondary | ICD-10-CM | POA: Diagnosis not present

## 2019-11-02 DIAGNOSIS — N185 Chronic kidney disease, stage 5: Secondary | ICD-10-CM | POA: Diagnosis not present

## 2019-11-02 DIAGNOSIS — L89322 Pressure ulcer of left buttock, stage 2: Secondary | ICD-10-CM | POA: Diagnosis not present

## 2019-11-02 DIAGNOSIS — E1122 Type 2 diabetes mellitus with diabetic chronic kidney disease: Secondary | ICD-10-CM | POA: Diagnosis not present

## 2019-11-02 DIAGNOSIS — M6281 Muscle weakness (generalized): Secondary | ICD-10-CM | POA: Diagnosis not present

## 2019-11-02 DIAGNOSIS — J9611 Chronic respiratory failure with hypoxia: Secondary | ICD-10-CM | POA: Diagnosis not present

## 2019-11-03 DIAGNOSIS — M19011 Primary osteoarthritis, right shoulder: Secondary | ICD-10-CM | POA: Diagnosis not present

## 2019-11-03 DIAGNOSIS — D631 Anemia in chronic kidney disease: Secondary | ICD-10-CM | POA: Diagnosis not present

## 2019-11-03 DIAGNOSIS — M21372 Foot drop, left foot: Secondary | ICD-10-CM | POA: Diagnosis not present

## 2019-11-03 DIAGNOSIS — Z992 Dependence on renal dialysis: Secondary | ICD-10-CM | POA: Diagnosis not present

## 2019-11-03 DIAGNOSIS — D509 Iron deficiency anemia, unspecified: Secondary | ICD-10-CM | POA: Diagnosis not present

## 2019-11-03 DIAGNOSIS — M19012 Primary osteoarthritis, left shoulder: Secondary | ICD-10-CM | POA: Diagnosis not present

## 2019-11-03 DIAGNOSIS — D689 Coagulation defect, unspecified: Secondary | ICD-10-CM | POA: Diagnosis not present

## 2019-11-03 DIAGNOSIS — N186 End stage renal disease: Secondary | ICD-10-CM | POA: Diagnosis not present

## 2019-11-03 DIAGNOSIS — M21371 Foot drop, right foot: Secondary | ICD-10-CM | POA: Diagnosis not present

## 2019-11-03 DIAGNOSIS — N2581 Secondary hyperparathyroidism of renal origin: Secondary | ICD-10-CM | POA: Diagnosis not present

## 2019-11-06 DIAGNOSIS — D631 Anemia in chronic kidney disease: Secondary | ICD-10-CM | POA: Diagnosis not present

## 2019-11-06 DIAGNOSIS — D689 Coagulation defect, unspecified: Secondary | ICD-10-CM | POA: Diagnosis not present

## 2019-11-06 DIAGNOSIS — N186 End stage renal disease: Secondary | ICD-10-CM | POA: Diagnosis not present

## 2019-11-06 DIAGNOSIS — D509 Iron deficiency anemia, unspecified: Secondary | ICD-10-CM | POA: Diagnosis not present

## 2019-11-06 DIAGNOSIS — Z992 Dependence on renal dialysis: Secondary | ICD-10-CM | POA: Diagnosis not present

## 2019-11-06 DIAGNOSIS — N2581 Secondary hyperparathyroidism of renal origin: Secondary | ICD-10-CM | POA: Diagnosis not present

## 2019-11-07 ENCOUNTER — Encounter (HOSPITAL_BASED_OUTPATIENT_CLINIC_OR_DEPARTMENT_OTHER): Payer: Medicare Other | Admitting: Internal Medicine

## 2019-11-07 DIAGNOSIS — Z79899 Other long term (current) drug therapy: Secondary | ICD-10-CM | POA: Diagnosis not present

## 2019-11-07 DIAGNOSIS — Z992 Dependence on renal dialysis: Secondary | ICD-10-CM | POA: Diagnosis not present

## 2019-11-07 DIAGNOSIS — N186 End stage renal disease: Secondary | ICD-10-CM | POA: Diagnosis not present

## 2019-11-08 DIAGNOSIS — D509 Iron deficiency anemia, unspecified: Secondary | ICD-10-CM | POA: Diagnosis not present

## 2019-11-08 DIAGNOSIS — D689 Coagulation defect, unspecified: Secondary | ICD-10-CM | POA: Diagnosis not present

## 2019-11-08 DIAGNOSIS — D631 Anemia in chronic kidney disease: Secondary | ICD-10-CM | POA: Diagnosis not present

## 2019-11-08 DIAGNOSIS — N186 End stage renal disease: Secondary | ICD-10-CM | POA: Diagnosis not present

## 2019-11-08 DIAGNOSIS — Z992 Dependence on renal dialysis: Secondary | ICD-10-CM | POA: Diagnosis not present

## 2019-11-08 DIAGNOSIS — N2581 Secondary hyperparathyroidism of renal origin: Secondary | ICD-10-CM | POA: Diagnosis not present

## 2019-11-09 ENCOUNTER — Encounter (HOSPITAL_BASED_OUTPATIENT_CLINIC_OR_DEPARTMENT_OTHER): Payer: Medicare Other | Admitting: Internal Medicine

## 2019-11-09 DIAGNOSIS — L89322 Pressure ulcer of left buttock, stage 2: Secondary | ICD-10-CM | POA: Diagnosis not present

## 2019-11-09 DIAGNOSIS — L89152 Pressure ulcer of sacral region, stage 2: Secondary | ICD-10-CM | POA: Diagnosis not present

## 2019-11-09 DIAGNOSIS — M6281 Muscle weakness (generalized): Secondary | ICD-10-CM | POA: Diagnosis not present

## 2019-11-09 DIAGNOSIS — S21102D Unspecified open wound of left front wall of thorax without penetration into thoracic cavity, subsequent encounter: Secondary | ICD-10-CM | POA: Diagnosis not present

## 2019-11-09 DIAGNOSIS — E1122 Type 2 diabetes mellitus with diabetic chronic kidney disease: Secondary | ICD-10-CM | POA: Diagnosis not present

## 2019-11-09 DIAGNOSIS — J9611 Chronic respiratory failure with hypoxia: Secondary | ICD-10-CM | POA: Diagnosis not present

## 2019-11-09 DIAGNOSIS — N185 Chronic kidney disease, stage 5: Secondary | ICD-10-CM | POA: Diagnosis not present

## 2019-11-10 DIAGNOSIS — D689 Coagulation defect, unspecified: Secondary | ICD-10-CM | POA: Diagnosis not present

## 2019-11-10 DIAGNOSIS — D631 Anemia in chronic kidney disease: Secondary | ICD-10-CM | POA: Diagnosis not present

## 2019-11-10 DIAGNOSIS — N186 End stage renal disease: Secondary | ICD-10-CM | POA: Diagnosis not present

## 2019-11-10 DIAGNOSIS — Z992 Dependence on renal dialysis: Secondary | ICD-10-CM | POA: Diagnosis not present

## 2019-11-10 DIAGNOSIS — D509 Iron deficiency anemia, unspecified: Secondary | ICD-10-CM | POA: Diagnosis not present

## 2019-11-10 DIAGNOSIS — N2581 Secondary hyperparathyroidism of renal origin: Secondary | ICD-10-CM | POA: Diagnosis not present

## 2019-11-11 DIAGNOSIS — G8194 Hemiplegia, unspecified affecting left nondominant side: Secondary | ICD-10-CM | POA: Diagnosis not present

## 2019-11-11 DIAGNOSIS — J449 Chronic obstructive pulmonary disease, unspecified: Secondary | ICD-10-CM | POA: Diagnosis not present

## 2019-11-11 DIAGNOSIS — G4733 Obstructive sleep apnea (adult) (pediatric): Secondary | ICD-10-CM | POA: Diagnosis not present

## 2019-11-13 DIAGNOSIS — N2581 Secondary hyperparathyroidism of renal origin: Secondary | ICD-10-CM | POA: Diagnosis not present

## 2019-11-13 DIAGNOSIS — Z992 Dependence on renal dialysis: Secondary | ICD-10-CM | POA: Diagnosis not present

## 2019-11-13 DIAGNOSIS — D631 Anemia in chronic kidney disease: Secondary | ICD-10-CM | POA: Diagnosis not present

## 2019-11-13 DIAGNOSIS — D689 Coagulation defect, unspecified: Secondary | ICD-10-CM | POA: Diagnosis not present

## 2019-11-13 DIAGNOSIS — N186 End stage renal disease: Secondary | ICD-10-CM | POA: Diagnosis not present

## 2019-11-13 DIAGNOSIS — D509 Iron deficiency anemia, unspecified: Secondary | ICD-10-CM | POA: Diagnosis not present

## 2019-11-14 ENCOUNTER — Encounter (HOSPITAL_BASED_OUTPATIENT_CLINIC_OR_DEPARTMENT_OTHER): Payer: Medicare Other | Admitting: Internal Medicine

## 2019-11-14 ENCOUNTER — Telehealth: Payer: Self-pay | Admitting: *Deleted

## 2019-11-14 ENCOUNTER — Other Ambulatory Visit: Payer: Medicare Other

## 2019-11-14 ENCOUNTER — Ambulatory Visit (HOSPITAL_COMMUNITY): Payer: Medicare Other

## 2019-11-14 DIAGNOSIS — Z86718 Personal history of other venous thrombosis and embolism: Secondary | ICD-10-CM

## 2019-11-14 DIAGNOSIS — E1122 Type 2 diabetes mellitus with diabetic chronic kidney disease: Secondary | ICD-10-CM | POA: Diagnosis not present

## 2019-11-14 DIAGNOSIS — Z992 Dependence on renal dialysis: Secondary | ICD-10-CM | POA: Diagnosis not present

## 2019-11-14 DIAGNOSIS — S31104D Unspecified open wound of abdominal wall, left lower quadrant without penetration into peritoneal cavity, subsequent encounter: Secondary | ICD-10-CM | POA: Diagnosis not present

## 2019-11-14 DIAGNOSIS — Z7901 Long term (current) use of anticoagulants: Secondary | ICD-10-CM | POA: Diagnosis not present

## 2019-11-14 DIAGNOSIS — S2190XA Unspecified open wound of unspecified part of thorax, initial encounter: Secondary | ICD-10-CM | POA: Diagnosis not present

## 2019-11-14 DIAGNOSIS — N185 Chronic kidney disease, stage 5: Secondary | ICD-10-CM | POA: Diagnosis not present

## 2019-11-14 DIAGNOSIS — J810 Acute pulmonary edema: Secondary | ICD-10-CM

## 2019-11-14 DIAGNOSIS — S2190XD Unspecified open wound of unspecified part of thorax, subsequent encounter: Secondary | ICD-10-CM | POA: Diagnosis not present

## 2019-11-14 DIAGNOSIS — L98498 Non-pressure chronic ulcer of skin of other sites with other specified severity: Secondary | ICD-10-CM | POA: Diagnosis not present

## 2019-11-14 DIAGNOSIS — Z86711 Personal history of pulmonary embolism: Secondary | ICD-10-CM | POA: Diagnosis not present

## 2019-11-14 DIAGNOSIS — E11622 Type 2 diabetes mellitus with other skin ulcer: Secondary | ICD-10-CM | POA: Diagnosis not present

## 2019-11-14 DIAGNOSIS — S31104A Unspecified open wound of abdominal wall, left lower quadrant without penetration into peritoneal cavity, initial encounter: Secondary | ICD-10-CM | POA: Diagnosis not present

## 2019-11-14 DIAGNOSIS — I120 Hypertensive chronic kidney disease with stage 5 chronic kidney disease or end stage renal disease: Secondary | ICD-10-CM | POA: Diagnosis not present

## 2019-11-14 MED ORDER — APIXABAN 5 MG PO TABS: 5.0000 mg | ORAL_TABLET | Freq: Two times a day (BID) | ORAL | 2 refills | Status: DC

## 2019-11-14 NOTE — Telephone Encounter (Signed)
Stop warfarin, Start Eliquis 5 mg BID. Keep all follow up on Derm and nephrologists.

## 2019-11-14 NOTE — Telephone Encounter (Signed)
Fax received [facility] PT/INR self testing service Test date/time 11/14/19 948 am INR 1.9

## 2019-11-14 NOTE — Addendum Note (Signed)
Addended by: Evelina Dun A on: 11/14/2019 04:57 PM   Modules accepted: Orders

## 2019-11-15 ENCOUNTER — Telehealth: Payer: Self-pay | Admitting: Neurology

## 2019-11-15 DIAGNOSIS — D509 Iron deficiency anemia, unspecified: Secondary | ICD-10-CM | POA: Diagnosis not present

## 2019-11-15 DIAGNOSIS — D631 Anemia in chronic kidney disease: Secondary | ICD-10-CM | POA: Diagnosis not present

## 2019-11-15 DIAGNOSIS — N186 End stage renal disease: Secondary | ICD-10-CM | POA: Diagnosis not present

## 2019-11-15 DIAGNOSIS — N2581 Secondary hyperparathyroidism of renal origin: Secondary | ICD-10-CM | POA: Diagnosis not present

## 2019-11-15 DIAGNOSIS — Z992 Dependence on renal dialysis: Secondary | ICD-10-CM | POA: Diagnosis not present

## 2019-11-15 DIAGNOSIS — D689 Coagulation defect, unspecified: Secondary | ICD-10-CM | POA: Diagnosis not present

## 2019-11-15 MED ORDER — LEVETIRACETAM 500 MG PO TABS: 500.0000 mg | ORAL_TABLET | Freq: Two times a day (BID) | ORAL | 5 refills | Status: AC

## 2019-11-15 NOTE — Addendum Note (Signed)
Addended by: Gildardo Griffes on: 11/15/2019 04:16 PM   Modules accepted: Orders

## 2019-11-15 NOTE — Telephone Encounter (Signed)
Wife wants to know if he needs to continue to check PT/INR at home?

## 2019-11-15 NOTE — Telephone Encounter (Signed)
Keppra refill sent to [id number]. At last office visit pt was advised to continue keppra.

## 2019-11-15 NOTE — Telephone Encounter (Signed)
[name],Patrick(wife of pt) has called asking for a refill on levETIRAcetam (KEPPRA) 500 MG tablet to be sent to [phone]

## 2019-11-16 ENCOUNTER — Encounter (HOSPITAL_BASED_OUTPATIENT_CLINIC_OR_DEPARTMENT_OTHER): Payer: Medicare Other | Admitting: Internal Medicine

## 2019-11-16 DIAGNOSIS — L89322 Pressure ulcer of left buttock, stage 2: Secondary | ICD-10-CM | POA: Diagnosis not present

## 2019-11-16 DIAGNOSIS — N185 Chronic kidney disease, stage 5: Secondary | ICD-10-CM | POA: Diagnosis not present

## 2019-11-16 DIAGNOSIS — M6281 Muscle weakness (generalized): Secondary | ICD-10-CM | POA: Diagnosis not present

## 2019-11-16 DIAGNOSIS — J9611 Chronic respiratory failure with hypoxia: Secondary | ICD-10-CM | POA: Diagnosis not present

## 2019-11-16 DIAGNOSIS — S21102D Unspecified open wound of left front wall of thorax without penetration into thoracic cavity, subsequent encounter: Secondary | ICD-10-CM | POA: Diagnosis not present

## 2019-11-16 DIAGNOSIS — E1122 Type 2 diabetes mellitus with diabetic chronic kidney disease: Secondary | ICD-10-CM | POA: Diagnosis not present

## 2019-11-16 DIAGNOSIS — L89152 Pressure ulcer of sacral region, stage 2: Secondary | ICD-10-CM | POA: Diagnosis not present

## 2019-11-16 NOTE — Progress Notes (Signed)
[name], [name] ([id number]) Visit Report for 11/14/2019 Arrival Information Details Patient Name: Date of Service: [name] [name] [facility] [place] R. 11/14/2019 10:30 A M Medical Record Number: [id number] Patient Account Number: [id number] Date of Birth/Sex: Treating RN: 11-22-1965 (54 y.o. [name] [name]: Evelina Dun Other Clinician: Referring [name] [name]: Treating [name] [name]/Extender: [name] [name] in Treatment: 2 Visit Information History Since Last Visit All ordered tests and consults were completed: Yes Patient Arrived: Wheel Chair Added or deleted any medications: No Arrival Time: 09:56 Any new allergies or adverse reactions: No Accompanied By: wife Had a fall or experienced change in No Transfer Assistance: None activities of daily living that may affect Patient Identification Verified: Yes risk of falls: Secondary Verification Process Completed: Yes Signs or symptoms of abuse/neglect since last visito No Patient Requires Transmission-Based Precautions: No Hospitalized since last visit: No Patient Has Alerts: Yes Implantable device outside of the clinic excluding No Patient Alerts: Patient on Blood Thinner cellular tissue based products placed in the [name] since last visit: Has Dressing in Place as Prescribed: Yes Pain Present Now: Yes Electronic Signature(s) Signed: 11/15/2019 12:46:48 PM By: Levan Hurst RN, BSN Entered By: Levan Hurst on 11/14/2019 10:05:24 -------------------------------------------------------------------------------- Clinic Level of Care Assessment Details Patient Name: Date of Service: [name] [name] R. 11/14/2019 10:30 A M Medical Record Number: [id number] Patient Account Number: [id number] Date of Birth/Sex: Treating RN: February 14, 1966 (54 y.o. [name] [name]) Carlene Coria Primary Care Nitika Jackowski: Evelina Dun Other Clinician: Referring Jaece Ducharme: Treating [name] [name]/Extender: Geraldine Contras Weeks in  Treatment: 2 Clinic Level of Care Assessment Items TOOL 4 Quantity Score X- 1 0 Use when only an EandM is performed on FOLLOW-UP visit ASSESSMENTS - Nursing Assessment / Reassessment X- 1 10 Reassessment of Co-morbidities (includes updates in patient status) X- 1 5 Reassessment of Adherence to Treatment Plan ASSESSMENTS - Wound and Skin A ssessment / Reassessment []  - 0 Simple Wound Assessment / Reassessment - one wound X- 2 5 Complex Wound Assessment / Reassessment - multiple wounds []  - 0 Dermatologic / Skin Assessment (not related to wound area) ASSESSMENTS - Focused Assessment []  - 0 Circumferential Edema Measurements - multi extremities []  - 0 Nutritional Assessment / Counseling / Intervention []  - 0 Lower Extremity Assessment (monofilament, tuning fork, pulses) []  - 0 Peripheral Arterial Disease Assessment (using hand held doppler) ASSESSMENTS - Ostomy and/or Continence Assessment and Care []  - 0 Incontinence Assessment and Management []  - 0 Ostomy Care Assessment and Management (repouching, etc.) PROCESS - Coordination of Care X - Simple Patient / Family Education for ongoing care 1 15 []  - 0 Complex (extensive) Patient / Family Education for ongoing care X- 1 10 Staff obtains Programmer, systems, Records, T Results / Process Orders est []  - 0 Staff telephones HHA, Nursing Homes / Clarify orders / etc []  - 0 Routine Transfer to another Facility (non-emergent condition) []  - 0 Routine Hospital Admission (non-emergent condition) []  - 0 New Admissions / Biomedical engineer / Ordering NPWT Apligraf, etc. , []  - 0 Emergency Hospital Admission (emergent condition) X- 1 10 Simple Discharge Coordination []  - 0 Complex (extensive) Discharge Coordination PROCESS - Special Needs []  - 0 Pediatric / Minor Patient Management []  - 0 Isolation Patient Management []  - 0 Hearing / Language / Visual special needs []  - 0 Assessment of Community assistance (transportation,  D/C planning, etc.) []  - 0 Additional assistance / Altered mentation []  - 0 Support Surface(s) Assessment (bed, cushion, seat, etc.) INTERVENTIONS - Wound Cleansing / Measurement []  -  0 Simple Wound Cleansing - one wound X- 2 5 Complex Wound Cleansing - multiple wounds X- 1 5 Wound Imaging (photographs - any number of wounds) []  - 0 Wound Tracing (instead of photographs) []  - 0 Simple Wound Measurement - one wound X- 2 5 Complex Wound Measurement - multiple wounds INTERVENTIONS - Wound Dressings []  - 0 Small Wound Dressing one or multiple wounds X- 2 15 Medium Wound Dressing one or multiple wounds []  - 0 Large Wound Dressing one or multiple wounds X- 1 5 Application of Medications - topical []  - 0 Application of Medications - injection INTERVENTIONS - Miscellaneous []  - 0 External ear exam []  - 0 Specimen Collection (cultures, biopsies, blood, body fluids, etc.) []  - 0 Specimen(s) / Culture(s) sent or taken to Lab for analysis []  - 0 Patient Transfer (multiple staff / Civil Service fast streamer / Similar devices) []  - 0 Simple Staple / Suture removal (25 or less) []  - 0 Complex Staple / Suture removal (26 or more) []  - 0 Hypo / Hyperglycemic Management (close monitor of Blood Glucose) []  - 0 Ankle / Brachial Index (ABI) - do not check if billed separately X- 1 5 Vital Signs Has the patient been seen at the hospital within the last three years: Yes Total Score: 125 Level Of Care: New/Established - Level 4 Electronic Signature(s) Signed: 11/14/2019 5:13:38 PM By: Carlene Coria RN Entered By: Carlene Coria on 11/14/2019 12:07:27 -------------------------------------------------------------------------------- Encounter Discharge Information Details Patient Name: Date of Service: [name] [name] R. 11/14/2019 10:30 A M Medical Record Number: [id number] Patient Account Number: [id number] Date of Birth/Sex: Treating RN: Dec 03, 1965 (54 y.o. [name] [name] Primary Care Rivan Siordia:  Evelina Dun Other Clinician: Referring Bev Drennen: Treating Khandi Kernes/Extender: [name] [name] in Treatment: 2 Encounter Discharge Information Items Discharge Condition: Stable Ambulatory Status: Wheelchair Discharge Destination: Home Transportation: Private Auto Accompanied By: wife Schedule Follow-up Appointment: Yes Clinical Summary of Care: Electronic Signature(s) Signed: 11/14/2019 3:47:52 PM By: Deon Pilling Entered By: Deon Pilling on 11/14/2019 11:45:41 -------------------------------------------------------------------------------- Multi Wound Chart Details Patient Name: Date of Service: [name] [name] R. 11/14/2019 10:30 A M Medical Record Number: [id number] Patient Account Number: [id number] Date of Birth/Sex: Treating RN: Dec 23, 1965 (54 y.o. [name] [name]) Carlene Coria Primary Care Randeep Biondolillo: Evelina Dun Other Clinician: Referring Bomani Oommen: Treating Autrey Human/Extender: Geraldine Contras Weeks in Treatment: 2 Vital Signs Height(in): 75 Pulse(bpm): 75 Weight(lbs): 373 Blood Pressure(mmHg): 100/61 Body Mass Index(BMI): 47 Temperature(F): 98.2 Respiratory Rate(breaths/min): 18 Photos: [1:No Photos Left, Proximal Flank] [2:No Photos Left, Lateral Abdomen - Lower] [N/A:N/A N/A] Wound Location: [1:Gradually Appeared] [2:Quadrant Gradually Appeared] [N/A:N/A] Wounding Event: [1:Cellulitis] [2:Cellulitis] [N/A:N/A] Primary Etiology: [1:Anemia, Type II Diabetes,] [2:Anemia, Type II Diabetes,] [N/A:N/A] Comorbid History: [1:Osteoarthritis 08/14/2019] [2:Osteoarthritis 08/14/2019] [N/A:N/A] Date Acquired: [1:2] [2:2] [N/A:N/A] Weeks of Treatment: [1:Open] [2:Open] [N/A:N/A] Wound Status: [1:5.2x11.3x0.1] [2:6.5x9x1.3] [N/A:N/A] Measurements L x W x D (cm) [1:46.15] [2:45.946] [N/A:N/A] A (cm) : rea [1:4.615] [2:59.73] [N/A:N/A] Volume (cm) : [1:-155.50%] [2:-8.30%] [N/A:N/A] % Reduction in Area: [1:-155.50%] [2:-1308.40%] [N/A:N/A] %  Reduction in Volume: [1:Full Thickness Without Exposed] [2:Full Thickness Without Exposed] [N/A:N/A] Classification: [1:Support Structures Medium] [2:Support Structures Medium] [N/A:N/A] Exudate A mount: [1:Serosanguineous] [2:Serosanguineous] [N/A:N/A] Exudate Type: [1:red, brown] [2:red, brown] [N/A:N/A] Exudate Color: [1:Flat and Intact] [2:Flat and Intact] [N/A:N/A] Wound Margin: [1:Small (1-33%)] [2:Small (1-33%)] [N/A:N/A] Granulation Amount: [1:N/A] [2:Red] [N/A:N/A] Granulation Quality: [1:Large (67-100%)] [2:Large (67-100%)] [N/A:N/A] Necrotic Amount: [1:Adherent Slough] [2:Eschar, Adherent Slough] [N/A:N/A] Necrotic Tissue: [1:Fat Layer (Subcutaneous Tissue)] [2:Fat Layer (Subcutaneous Tissue)] [N/A:N/A] Exposed Structures: [1:Exposed: Yes  Fascia: No Tendon: No Muscle: No Joint: No Bone: No None] [2:Exposed: Yes Fascia: No Tendon: No Muscle: No Joint: No Bone: No None] [N/A:N/A] Treatment Notes Wound #1 (Left, Proximal Flank) 1. Cleanse With Wound Cleanser 3. Primary Dressing Applied Calcium Alginate Other primary dressing (specifiy in notes) 4. Secondary Dressing ABD Pad 5. Secured With Medipore tape Notes primary dressing medihoney alginate. educated patient about the dressings. Wound #2 (Left, Lateral Abdomen - Lower Quadrant) 1. Cleanse With Wound Cleanser 3. Primary Dressing Applied Calcium Alginate Other primary dressing (specifiy in notes) 4. Secondary Dressing ABD Pad 5. Secured With Medipore tape Notes primary dressing medihoney alginate. educated patient about the dressings. Electronic Signature(s) Signed: 11/14/2019 5:13:38 PM By: Carlene Coria RN Signed: 11/16/2019 12:52:55 PM By: Linton Ham MD Entered By: Linton Ham on 11/14/2019 12:53:31 -------------------------------------------------------------------------------- Multi-Disciplinary Care Plan Details Patient Name: Date of Service: [place] [name] [name] R. 11/14/2019 10:30 A M Medical Record  Number: [id number] Patient Account Number: [id number] Date of Birth/Sex: Treating RN: Nov 11, 1965 (54 y.o. [name] [name] Primary Care Geoffery Aultman: Evelina Dun Other Clinician: Referring Seba Madole: Treating Verbena Boeding/Extender: [name] [name] in Treatment: 2 Active Inactive Abuse / Safety / Falls / Self Care Management Nursing Diagnoses: Potential for falls Potential for injury related to falls Goals: Patient will not experience any injury related to falls Date Initiated: 10/30/2019 Target Resolution Date: 12/01/2019 Goal Status: Active Patient/caregiver will verbalize/demonstrate measures taken to prevent injury and/or falls Date Initiated: 10/30/2019 Target Resolution Date: 12/01/2019 Goal Status: Active Interventions: Assess Activities of Daily Living upon admission and as needed Assess fall risk on admission and as needed Assess: immobility, friction, shearing, incontinence upon admission and as needed Assess impairment of mobility on admission and as needed per policy Assess personal safety and home safety (as indicated) on admission and as needed Assess self care needs on admission and as needed Provide education on fall prevention Provide education on personal and home safety Notes: Nutrition Nursing Diagnoses: Impaired glucose control: actual or potential Potential for alteratiion in Nutrition/Potential for imbalanced nutrition Goals: Patient/caregiver agrees to and verbalizes understanding of need to use nutritional supplements and/or vitamins as prescribed Date Initiated: 10/30/2019 Target Resolution Date: 12/01/2019 Goal Status: Active Patient/caregiver will maintain therapeutic glucose control Date Initiated: 10/30/2019 Target Resolution Date: 12/01/2019 Goal Status: Active Interventions: Assess HgA1c results as ordered upon admission and as needed Assess patient nutrition upon admission and as needed per policy Provide education on elevated blood sugars  and impact on wound healing Provide education on nutrition Treatment Activities: Education provided on Nutrition : 10/30/2019 Notes: Wound/Skin Impairment Nursing Diagnoses: Impaired tissue integrity Knowledge deficit related to ulceration/compromised skin integrity Goals: Patient/caregiver will verbalize understanding of skin care regimen Date Initiated: 10/30/2019 Target Resolution Date: 12/01/2019 Goal Status: Active Interventions: Assess patient/caregiver ability to obtain necessary supplies Assess patient/caregiver ability to perform ulcer/skin care regimen upon admission and as needed Assess ulceration(s) every visit Provide education on ulcer and skin care Notes: Electronic Signature(s) Signed: 11/14/2019 5:13:38 PM By: Carlene Coria RN Entered By: Carlene Coria on 11/14/2019 09:58:16 -------------------------------------------------------------------------------- Pain Assessment Details Patient Name: Date of Service: [place] [name] [name] R. 11/14/2019 10:30 A M Medical Record Number: [id number] Patient Account Number: [id number] Date of Birth/Sex: Treating RN: Jul 11, 1965 (54 y.o. [name] [name] Primary Care Micky Overturf: Evelina Dun Other Clinician: Referring Danell Verno: Treating Darinda [place]/Extender: [name] [name] in Treatment: 2 Active Problems Location of Pain Severity and Description of Pain Patient Has Paino Yes Site Locations Pain Location: Pain in Ulcers  With Dressing Change: Yes Rate the pain. Current Pain Level: 6 Character of Pain Describe the Pain: Throbbing Pain Management and Medication Current Pain Management: Medication: Yes Cold Application: No Rest: No Massage: No Activity: No T.E.N.S.: No Heat Application: No Leg drop or elevation: No Is the Current Pain Management Adequate: Adequate How does your wound impact your activities of daily livingo Sleep: No Bathing: No Appetite: No Relationship With Others: No Bladder  Continence: No Emotions: No Bowel Continence: No Work: No Toileting: No Drive: No Dressing: No Hobbies: No Electronic Signature(s) Signed: 11/15/2019 12:46:48 PM By: Levan Hurst RN, BSN Entered By: Levan Hurst on 11/14/2019 10:02:34 -------------------------------------------------------------------------------- Patient/Caregiver Education Details Patient Name: Date of Service: Claudette Laws 5/18/2021andnbsp10:30 [place] Record Number: [id number] Patient Account Number: [id number] Date of Birth/Gender: Treating RN: [birth year] (54 y.o. [name] [name] Primary Care Physician: Evelina Dun Other Clinician: Referring Physician: Treating Physician/Extender: [name] [name] in Treatment: 2 Education Assessment Education Provided To: Patient Education Topics Provided Elevated Blood Sugar/ Impact on Healing: Methods: Explain/Verbal Responses: State content correctly Nutrition: Methods: Explain/Verbal Responses: State content correctly Electronic Signature(s) Signed: 11/14/2019 5:13:38 PM By: Carlene Coria RN Entered By: Carlene Coria on 11/14/2019 09:58:35 -------------------------------------------------------------------------------- Wound Assessment Details Patient Name: Date of Service: [name] [name] [place] R. 11/14/2019 10:30 A M Medical Record Number: [id number] Patient Account Number: [id number] Date of Birth/Sex: Treating RN: Jul 13, 1965 (54 y.o. [name] [name] Primary Care Teneshia Hedeen: Evelina Dun Other Clinician: Referring Kaylee Trivett: Treating Mayuri Staples/Extender: Geraldine Contras Weeks in Treatment: 2 Wound Status Wound Number: 1 Primary Etiology: Cellulitis Wound Location: Left, Proximal Flank Wound Status: Open Wounding Event: Gradually Appeared Comorbid History: Anemia, Type II Diabetes, Osteoarthritis Date Acquired: 08/14/2019 Weeks Of Treatment: 2 Clustered Wound: No Wound Measurements Length: (cm)  5.2 Width: (cm) 11.3 Depth: (cm) 0.1 Area: (cm) 46.15 Volume: (cm) 4.615 % Reduction in Area: -155.5% % Reduction in Volume: -155.5% Epithelialization: None Tunneling: No Undermining: No Wound Description Classification: Full Thickness Without Exposed Support Structures Wound Margin: Flat and Intact Exudate Amount: Medium Exudate Type: Serosanguineous Exudate Color: red, brown Foul Odor After Cleansing: No Slough/Fibrino Yes Wound Bed Granulation Amount: Small (1-33%) Exposed Structure Necrotic Amount: Large (67-100%) Fascia Exposed: No Necrotic Quality: Adherent Slough Fat Layer (Subcutaneous Tissue) Exposed: Yes Tendon Exposed: No Muscle Exposed: No Joint Exposed: No Bone Exposed: No Treatment Notes Wound #1 (Left, Proximal Flank) 1. Cleanse With Wound Cleanser 3. Primary Dressing Applied Calcium Alginate Other primary dressing (specifiy in notes) 4. Secondary Dressing ABD Pad 5. Secured With Medipore tape Notes primary dressing medihoney alginate. educated patient about the dressings. Electronic Signature(s) Signed: 11/15/2019 12:46:48 PM By: Levan Hurst RN, BSN Entered By: Levan Hurst on 11/14/2019 10:04:59 -------------------------------------------------------------------------------- Wound Assessment Details Patient Name: Date of Service: [name], MOREJON [name] [place] R. 11/14/2019 10:30 A M Medical Record Number: [id number] Patient Account Number: [id number] Date of Birth/Sex: Treating RN: 1965-07-17 (54 y.o. [name] [name] Primary Care Traivon Morrical: Evelina Dun Other Clinician: Referring Noriel Guthrie: Treating Ziara Thelander/Extender: Geraldine Contras Weeks in Treatment: 2 Wound Status Wound Number: 2 Primary Etiology: Cellulitis Wound Location: Left, Lateral Abdomen - Lower Quadrant Wound Status: Open Wounding Event: Gradually Appeared Comorbid History: Anemia, Type II Diabetes, Osteoarthritis Date Acquired: 08/14/2019 Weeks Of Treatment:  2 Clustered Wound: No Wound Measurements Length: (cm) 6.5 Width: (cm) 9 Depth: (cm) 1.3 Area: (cm) 45.946 Volume: (cm) 59.73 % Reduction in Area: -8.3% % Reduction in Volume: -1308.4% Epithelialization: None Tunneling: No Undermining: No Wound Description Classification:  Full Thickness Without Exposed Support Structures Wound Margin: Flat and Intact Exudate Amount: Medium Exudate Type: Serosanguineous Exudate Color: red, brown Foul Odor After Cleansing: No Slough/Fibrino Yes Wound Bed Granulation Amount: Small (1-33%) Exposed Structure Granulation Quality: Red Fascia Exposed: No Necrotic Amount: Large (67-100%) Fat Layer (Subcutaneous Tissue) Exposed: Yes Necrotic Quality: Eschar, Adherent Slough Tendon Exposed: No Muscle Exposed: No Joint Exposed: No Bone Exposed: No Treatment Notes Wound #2 (Left, Lateral Abdomen - Lower Quadrant) 1. Cleanse With Wound Cleanser 3. Primary Dressing Applied Calcium Alginate Other primary dressing (specifiy in notes) 4. Secondary Dressing ABD Pad 5. Secured With Medipore tape Notes primary dressing medihoney alginate. educated patient about the dressings. Electronic Signature(s) Signed: 11/15/2019 12:46:48 PM By: Levan Hurst RN, BSN Entered By: Levan Hurst on 11/14/2019 10:05:11 -------------------------------------------------------------------------------- Vitals Details Patient Name: Date of Service: [place], [place] [name] [place] R. 11/14/2019 10:30 A M Medical Record Number: [id number] Patient Account Number: [id number] Date of Birth/Sex: Treating RN: 1966/04/15 (54 y.o. [name] [name] Primary Care Maury Groninger: Evelina Dun Other Clinician: Referring Mandisa Persinger: Treating Laquandra Carrillo/Extender: [name] [name] in Treatment: 2 Vital Signs Time Taken: 09:58 Temperature (F): 98.2 Height (in): 75 Pulse (bpm): 75 Weight (lbs): 373 Respiratory Rate (breaths/min): 18 Body Mass Index (BMI): 46.6 Blood  Pressure (mmHg): 100/61 Reference Range: 80 - 120 mg / dl Electronic Signature(s) Signed: 11/15/2019 81:85:63 PM By: Levan Hurst RN, BSN Entered By: Levan Hurst on 11/14/2019 09:59:17

## 2019-11-16 NOTE — Telephone Encounter (Signed)
No, this is no longer needed with Eliquis.

## 2019-11-16 NOTE — Progress Notes (Signed)
[name], [name] ([id number]) Visit Report for 11/14/2019 HPI Details Patient Name: Date of Service: [place] 11/14/2019 10:30 A M Medical Record Number: [id number] Patient Account Number: [id number] Date of Birth/Sex: Treating RN: 06-May-1966 (54 y.o. [name] Primary Care Provider: Evelina Dun Other Clinician: Referring Provider: Treating Provider/Extender: Osker Mason in Treatment: 2 History of Present Illness HPI Description: Admission 10/30/2019 This is a 54 year old very disabled man with multiple medical issues. 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 His problems apparently started roughly 4 months ago s he noted expanding painful areas on the left lateral abdominal wall and the left lateral thorax. These were very painful and necrotic and have been expanding. It is really deteriorated for him in October when he had Covid and had to be hospitalized. This caused deterioration in his respiratory function including chronic oxygen at 3 to 4 L, onset of dialysis and weight loss of 60 pounds. Most recently has home health of implying silver alginate to these expanding wounds. He has been on 2 courses of doxycycline in early April and then once last week given to him by his primary doctor who had a telehealth visit. He is here for review of these issues. 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 Past medical history includes type 2 diabetes well controlled with a recent hemoglobin A1c of 6.6, chronic O2 requirements, morbid obesity, Coumadin secondary to history of DVT and PE, stage V chronic renal failure, BPH, seizure disorder although none recently, left AV fistula, he was seen by palliative care on 4/27 noted to be  immobile with widespread arthritic pain high O2 requirements. He is however full code and I did not discuss advanced treatment issues 5/18; the patient has been on sodium thiosulfate at dialysis for 2 weeks. 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 He was kindly seen by Dr. Sidney Ace dermatology at [facility]. She also suggested stopping Coumadin Coumadin immediately and transitioning to a different anticoagulant. Also suggested stopping calcium containing phosphate binders and using alternative binders like Renvela although I am not exactly sure what if the patient is on a calcium containing phosphate binder. I have contacted the patient's primary physician at [facility] family [facility] to stop the Coumadin. As noted 2 weeks ago I talked with Dr. Moshe Cipro at nephrology about thiosulfate. Electronic Signature(s) Signed: 11/16/2019 12:52:55 PM By: Linton Ham MD Entered By: Linton Ham on 11/14/2019 12:55:31 -------------------------------------------------------------------------------- Physical Exam Details Patient Name: Date of Service: [name], Patrick [place] R. 11/14/2019 10:30 A M Medical Record Number: [id number] Patient Account Number: [id number] Date of Birth/Sex: Treating RN: 02-24-1966 (54 y.o. [name] Primary Care Provider: Evelina Dun Other Clinician: Referring Provider: Treating Provider/Extender: Osker Mason in Treatment: 2 Constitutional Patient is hypertensive.. Pulse regular and within target range for patient.Marland Kitchen Respirations regular, non-labored and within target range.. Temperature is normal and within the target range for the patient.Marland Kitchen Appears in no distress. Respiratory work of breathing is normal. Shallow but equal air entry.. Cardiovascular Heart  sounds are distant. Gastrointestinal (GI) Morbidly obese. Nontender.. Notes Wound exam The  area on the left abdominal wall looks somewhat less erythematous but there is a large area of tissue necrosis still. Firmness to palpation but not particularly tender. The area on the left breast about the same. Very concerning area of reniform purpura on the left anterior upper thigh. 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 Electronic Signature(s) Signed: 11/16/2019 12:52:55 PM By: Linton Ham MD Entered By: Linton Ham on 11/14/2019 12:56:55 -------------------------------------------------------------------------------- Physician Orders Details Patient Name: Date of Service: [name], [name] 11/14/2019 10:30 A M Medical Record Number: [id number] Patient Account Number: [id number] Date of Birth/Sex: Treating RN: 09/04/1965 (54 y.o. [name]) Carlene Coria Primary Care Provider: Evelina Dun Other Clinician: Referring Provider: Treating Provider/Extender: Osker Mason in Treatment: 2 Verbal / Phone Orders: No Diagnosis Coding ICD-10 Coding Code Description S31.104D Unspecified open wound of abdominal wall, left lower quadrant without penetration into peritoneal cavity, subsequent encounter L98.498 Non-pressure chronic ulcer of skin of other sites with other specified severity S21.90XD Unspecified open wound of unspecified part of thorax, subsequent encounter E83.59 Other disorders of calcium metabolism Follow-up Appointments Return Appointment in 1 week. Dressing Change Frequency Wound #1 Left,Proximal Flank Change Dressing every other day. Wound #2 Left,Lateral Abdomen - Lower Quadrant Change Dressing every other day. Wound Cleansing Wound #1 Left,Proximal Flank Clean wound with Wound Cleanser - or normal saline Wound #2 Left,Lateral Abdomen - Lower Quadrant Clean wound with Wound Cleanser - or normal saline Primary Wound Dressing Wound #1 Left,Proximal  Flank Medihoney Alginate Wound #2 Left,Lateral Abdomen - Lower Quadrant Medihoney Alginate Secondary Dressing Wound #1 Left,Proximal Flank Dry Gauze ABD pad - secure with tape Wound #2 Left,Lateral Abdomen - Lower Quadrant Dry Gauze ABD pad - secure with tape [place] skilled [facility] for wound care. - [facility] Electronic Signature(s) Signed: 11/14/2019 5:13:38 PM By: Carlene Coria RN Signed: 11/16/2019 12:52:55 PM By: Linton Ham MD Entered By: Carlene Coria on 11/14/2019 11:30:42 -------------------------------------------------------------------------------- Problem List Details Patient Name: Date of Service: [name], [name] 11/14/2019 10:30 A M Medical Record Number: [id number] Patient Account Number: [id number] Date of Birth/Sex: Treating RN: [birth year] (54 y.o. [name] Primary Care Provider: Evelina Dun Other Clinician: Referring Provider: Treating Provider/Extender: Osker Mason in Treatment: 2 Active Problems ICD-10 Encounter Code Description Active Date MDM Diagnosis S31.104D Unspecified open wound of abdominal wall, left lower quadrant without 10/30/2019 No Yes penetration into peritoneal cavity, subsequent encounter L98.498 Non-pressure chronic ulcer of skin of other sites with other specified severity 10/30/2019 No Yes S21.90XD Unspecified open wound of unspecified part of thorax, subsequent encounter 10/30/2019 No Yes E83.59 Other disorders of calcium metabolism 10/30/2019 No Yes Inactive Problems Resolved Problems Electronic Signature(s) Signed: 11/16/2019 12:52:55 PM By: Linton Ham MD Entered By: Linton Ham on 11/14/2019 12:53:21 -------------------------------------------------------------------------------- Progress Note Details Patient Name: Date of Service: [name] 11/14/2019 10:30 A M Medical Record Number: [id number] Patient Account Number: [id number] Date of  Birth/Sex: Treating RN: 21-Sep-1965 (54 y.o. [name] Primary Care Provider: Evelina Dun Other Clinician: Referring Provider: Treating Provider/Extender: Geraldine Contras Weeks in Treatment: 2 Subjective History of Present Illness (HPI) Admission 10/30/2019 This is a 54 year old very disabled man with multiple medical issues. 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 His problems apparently started roughly 4 months ago s he noted expanding painful areas on the left lateral  abdominal wall and the left lateral thorax. These were very painful and necrotic and have been expanding. It is really deteriorated for him in October when he had Covid and had to be hospitalized. This caused deterioration in his respiratory function including chronic oxygen at 3 to 4 L, onset of dialysis and weight loss of 60 pounds. Most recently has home health of implying silver alginate to these expanding wounds. He has been on 2 courses of doxycycline in early April and then once last week given to him by his primary doctor who had a telehealth visit. He is here for review of these issues. 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 Past medical history includes type 2 diabetes well controlled with a recent hemoglobin A1c of 6.6, chronic O2 requirements, morbid obesity, Coumadin secondary to history of DVT and PE, stage V chronic renal failure, BPH, seizure disorder although none recently, left AV fistula, he was seen by palliative care on 4/27 noted to be immobile with widespread arthritic pain high O2 requirements. He is however full code and I did not discuss advanced treatment issues 5/18; the patient has been on sodium thiosulfate at dialysis for 2 weeks. 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 He was kindly seen by Dr. Sidney Ace dermatology at [facility]. She also suggested stopping Coumadin Coumadin immediately and transitioning to a different anticoagulant. Also suggested stopping calcium containing phosphate binders and using alternative binders like Renvela although I am not exactly sure what if the patient is on a calcium containing phosphate binder. I have contacted the patient's primary physician at [facility] to stop the Coumadin. As noted 2 weeks ago I talked with Dr. Moshe Cipro at nephrology about thiosulfate. Objective Constitutional Patient is hypertensive.. Pulse regular and within target range for patient.Marland Kitchen Respirations regular, non-labored and within target range.. Temperature is normal and within the target range for the patient.Marland Kitchen Appears in no distress. Vitals Time Taken: 9:58 AM, Height: 75 in, Weight: 373 lbs, BMI: 46.6, Temperature: 98.2 F, Pulse: 75 bpm, Respiratory Rate: 18 breaths/min, Blood Pressure: 100/61 mmHg. Respiratory work of breathing is normal. Shallow but equal air entry.. Cardiovascular Heart sounds are distant. Gastrointestinal (GI) Morbidly obese. Nontender.. General Notes: Wound exam ooThe area on the left abdominal wall looks somewhat less erythematous but there is a large area of tissue necrosis still. Firmness to palpation but not particularly tender. ooThe area on the left breast about the same. ooVery concerning area of reniform purpura on the left anterior upper thigh. 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 Integumentary (Hair, Skin) Wound #1 status is Open. Original cause of wound was Gradually Appeared. The wound is located on the Left,Proximal Flank. The wound measures 5.2cm length x 11.3cm width x 0.1cm depth;  46.15cm^2 area and 4.615cm^3 volume. There is Fat Layer (Subcutaneous Tissue) Exposed exposed. There is no tunneling or undermining noted. There is a medium amount of serosanguineous drainage noted. The wound margin is flat and intact. There is small (1-33%) granulation within the wound bed. There is a large (67-100%) amount of necrotic tissue within the wound bed including Adherent Slough. Wound #2 status is Open. Original cause of wound was Gradually Appeared. The wound is located on the Left,Lateral Abdomen - Lower Quadrant. The  wound measures 6.5cm length x 9cm width x 1.3cm depth; 45.946cm^2 area and 59.73cm^3 volume. There is Fat Layer (Subcutaneous Tissue) Exposed exposed. There is no tunneling or undermining noted. There is a medium amount of serosanguineous drainage noted. The wound margin is flat and intact. There is small (1-33%) red granulation within the wound bed. There is a large (67-100%) amount of necrotic tissue within the wound bed including Eschar and Adherent Slough. Assessment Active Problems ICD-10 Unspecified open wound of abdominal wall, left lower quadrant without penetration into peritoneal cavity, subsequent encounter Non-pressure chronic ulcer of skin of other sites with other specified severity Unspecified open wound of unspecified part of thorax, subsequent encounter Other disorders of calcium metabolism Plan Follow-up Appointments: Return Appointment in 1 week. Dressing Change Frequency: Wound #1 Left,Proximal Flank: Change Dressing every other day. Wound #2 Left,Lateral Abdomen - Lower Quadrant: Change Dressing every other day. Wound Cleansing: Wound #1 Left,Proximal Flank: Clean wound with Wound Cleanser - or normal saline Wound #2 Left,Lateral Abdomen - Lower Quadrant: Clean wound with Wound Cleanser - or normal saline Primary Wound Dressing: Wound #1 Left,Proximal Flank: Medihoney Alginate Wound #2 Left,Lateral Abdomen - Lower Quadrant: Medihoney  Alginate Secondary Dressing: Wound #1 Left,Proximal Flank: Dry Gauze ABD pad - secure with tape Wound #2 Left,Lateral Abdomen - Lower Quadrant: Dry Gauze ABD pad - secure with tape Home Health: [place] [facility] for wound care. - [facility] 1. I have spoken to primary care at [facility] and they have agreed to discontinue Coumadin and transition to either Xarelto or Eliquis 2. Consider Trental may be after we get him stabilized on a new anticoagulant 3. I am not sure if the patient is on calcium containing phosphate binders. 4. I am not sure I agree with the issue of not debriding these as I have found this to be very successful in the past but with his bleeding I am certainly not going to debride them in any case. I change the primary dressing here to manuka honey which should help with debridement. The amount of Santyl it would take to debride these wounds with the I am sure cost prohibitive. 5. I am very concerned that the area on the left upper thigh is going to breakdown. Classic retiform purpura Electronic Signature(s) Signed: 11/16/2019 12:52:55 PM By: Linton Ham MD Entered By: Linton Ham on 11/14/2019 12:59:05 -------------------------------------------------------------------------------- SuperBill Details Patient Name: Date of Service: Patrick [place] R. 11/14/2019 Medical Record Number: [id number] Patient Account Number: [id number] Date of Birth/Sex: Treating RN: 09-06-1965 (54 y.o. [name]) Carlene Coria Primary Care Provider: Evelina Dun Other Clinician: Referring Provider: Treating Provider/Extender: Osker Mason in Treatment: 2 Diagnosis Coding ICD-10 Codes Code Description S31.104D Unspecified open wound of abdominal wall, left lower quadrant without penetration into peritoneal cavity, subsequent encounter L98.498 Non-pressure chronic ulcer of skin of other sites with other specified  severity S21.90XD Unspecified open wound of unspecified part of thorax, subsequent encounter E83.59 Other disorders of calcium metabolism Facility Procedures CPT4 Code: [id number] Description: 99214 - WOUND CARE VISIT-LEV 4 EST PT Modifier: Quantity: 1 Physician Procedures : CPT4 Code Description Modifier [id number] - WC PHYS LEVEL 4 - EST PT ICD-10 Diagnosis Description S31.104D Unspecified open wound of abdominal wall, left lower quadrant without penetration into peritoneal cavity, subsequent encounter L98.498  Non-pressure chronic ulcer of skin of other sites with other specified severity S21.90XD Unspecified open wound of unspecified part of thorax, subsequent encounter E83.59 Other disorders of calcium metabolism Quantity:  1 Electronic Signature(s) Signed: 11/16/2019 12:52:55 PM By: Linton Ham MD Entered By: Linton Ham on 11/14/2019 12:59:34

## 2019-11-16 NOTE — Telephone Encounter (Signed)
Patient aware and verbalized understanding. °

## 2019-11-17 DIAGNOSIS — N2581 Secondary hyperparathyroidism of renal origin: Secondary | ICD-10-CM | POA: Diagnosis not present

## 2019-11-17 DIAGNOSIS — D689 Coagulation defect, unspecified: Secondary | ICD-10-CM | POA: Diagnosis not present

## 2019-11-17 DIAGNOSIS — N186 End stage renal disease: Secondary | ICD-10-CM | POA: Diagnosis not present

## 2019-11-17 DIAGNOSIS — D631 Anemia in chronic kidney disease: Secondary | ICD-10-CM | POA: Diagnosis not present

## 2019-11-17 DIAGNOSIS — D509 Iron deficiency anemia, unspecified: Secondary | ICD-10-CM | POA: Diagnosis not present

## 2019-11-17 DIAGNOSIS — Z992 Dependence on renal dialysis: Secondary | ICD-10-CM | POA: Diagnosis not present

## 2019-11-20 DIAGNOSIS — D631 Anemia in chronic kidney disease: Secondary | ICD-10-CM | POA: Diagnosis not present

## 2019-11-20 DIAGNOSIS — G8194 Hemiplegia, unspecified affecting left nondominant side: Secondary | ICD-10-CM | POA: Diagnosis not present

## 2019-11-20 DIAGNOSIS — J449 Chronic obstructive pulmonary disease, unspecified: Secondary | ICD-10-CM | POA: Diagnosis not present

## 2019-11-20 DIAGNOSIS — D509 Iron deficiency anemia, unspecified: Secondary | ICD-10-CM | POA: Diagnosis not present

## 2019-11-20 DIAGNOSIS — N2581 Secondary hyperparathyroidism of renal origin: Secondary | ICD-10-CM | POA: Diagnosis not present

## 2019-11-20 DIAGNOSIS — D689 Coagulation defect, unspecified: Secondary | ICD-10-CM | POA: Diagnosis not present

## 2019-11-20 DIAGNOSIS — Z992 Dependence on renal dialysis: Secondary | ICD-10-CM | POA: Diagnosis not present

## 2019-11-20 DIAGNOSIS — N186 End stage renal disease: Secondary | ICD-10-CM | POA: Diagnosis not present

## 2019-11-20 DIAGNOSIS — I279 Pulmonary heart disease, unspecified: Secondary | ICD-10-CM | POA: Diagnosis not present

## 2019-11-20 DIAGNOSIS — S21109A Unspecified open wound of unspecified front wall of thorax without penetration into thoracic cavity, initial encounter: Secondary | ICD-10-CM | POA: Diagnosis not present

## 2019-11-21 ENCOUNTER — Encounter (HOSPITAL_BASED_OUTPATIENT_CLINIC_OR_DEPARTMENT_OTHER): Payer: Medicare Other | Admitting: Internal Medicine

## 2019-11-21 DIAGNOSIS — E1122 Type 2 diabetes mellitus with diabetic chronic kidney disease: Secondary | ICD-10-CM | POA: Diagnosis not present

## 2019-11-21 DIAGNOSIS — N185 Chronic kidney disease, stage 5: Secondary | ICD-10-CM | POA: Diagnosis not present

## 2019-11-21 DIAGNOSIS — J9611 Chronic respiratory failure with hypoxia: Secondary | ICD-10-CM | POA: Diagnosis not present

## 2019-11-21 DIAGNOSIS — S21102D Unspecified open wound of left front wall of thorax without penetration into thoracic cavity, subsequent encounter: Secondary | ICD-10-CM | POA: Diagnosis not present

## 2019-11-21 DIAGNOSIS — L89152 Pressure ulcer of sacral region, stage 2: Secondary | ICD-10-CM | POA: Diagnosis not present

## 2019-11-21 DIAGNOSIS — L89322 Pressure ulcer of left buttock, stage 2: Secondary | ICD-10-CM | POA: Diagnosis not present

## 2019-11-21 DIAGNOSIS — M6281 Muscle weakness (generalized): Secondary | ICD-10-CM | POA: Diagnosis not present

## 2019-11-22 DIAGNOSIS — Z992 Dependence on renal dialysis: Secondary | ICD-10-CM | POA: Diagnosis not present

## 2019-11-22 DIAGNOSIS — N186 End stage renal disease: Secondary | ICD-10-CM | POA: Diagnosis not present

## 2019-11-22 DIAGNOSIS — D631 Anemia in chronic kidney disease: Secondary | ICD-10-CM | POA: Diagnosis not present

## 2019-11-22 DIAGNOSIS — D689 Coagulation defect, unspecified: Secondary | ICD-10-CM | POA: Diagnosis not present

## 2019-11-22 DIAGNOSIS — N2581 Secondary hyperparathyroidism of renal origin: Secondary | ICD-10-CM | POA: Diagnosis not present

## 2019-11-22 DIAGNOSIS — D509 Iron deficiency anemia, unspecified: Secondary | ICD-10-CM | POA: Diagnosis not present

## 2019-11-23 ENCOUNTER — Ambulatory Visit (HOSPITAL_COMMUNITY): Payer: Medicare Other

## 2019-11-23 DIAGNOSIS — G894 Chronic pain syndrome: Secondary | ICD-10-CM | POA: Diagnosis not present

## 2019-11-23 DIAGNOSIS — Z79899 Other long term (current) drug therapy: Secondary | ICD-10-CM | POA: Diagnosis not present

## 2019-11-23 DIAGNOSIS — L89322 Pressure ulcer of left buttock, stage 2: Secondary | ICD-10-CM | POA: Diagnosis not present

## 2019-11-23 DIAGNOSIS — L8990 Pressure ulcer of unspecified site, unspecified stage: Secondary | ICD-10-CM | POA: Diagnosis not present

## 2019-11-23 DIAGNOSIS — N185 Chronic kidney disease, stage 5: Secondary | ICD-10-CM | POA: Diagnosis not present

## 2019-11-23 DIAGNOSIS — M549 Dorsalgia, unspecified: Secondary | ICD-10-CM | POA: Diagnosis not present

## 2019-11-23 DIAGNOSIS — L89152 Pressure ulcer of sacral region, stage 2: Secondary | ICD-10-CM | POA: Diagnosis not present

## 2019-11-23 DIAGNOSIS — S21102D Unspecified open wound of left front wall of thorax without penetration into thoracic cavity, subsequent encounter: Secondary | ICD-10-CM | POA: Diagnosis not present

## 2019-11-23 DIAGNOSIS — M6281 Muscle weakness (generalized): Secondary | ICD-10-CM | POA: Diagnosis not present

## 2019-11-23 DIAGNOSIS — J9611 Chronic respiratory failure with hypoxia: Secondary | ICD-10-CM | POA: Diagnosis not present

## 2019-11-23 DIAGNOSIS — E1122 Type 2 diabetes mellitus with diabetic chronic kidney disease: Secondary | ICD-10-CM | POA: Diagnosis not present

## 2019-11-24 DIAGNOSIS — D689 Coagulation defect, unspecified: Secondary | ICD-10-CM | POA: Diagnosis not present

## 2019-11-24 DIAGNOSIS — D509 Iron deficiency anemia, unspecified: Secondary | ICD-10-CM | POA: Diagnosis not present

## 2019-11-24 DIAGNOSIS — Z992 Dependence on renal dialysis: Secondary | ICD-10-CM | POA: Diagnosis not present

## 2019-11-24 DIAGNOSIS — D631 Anemia in chronic kidney disease: Secondary | ICD-10-CM | POA: Diagnosis not present

## 2019-11-24 DIAGNOSIS — N2581 Secondary hyperparathyroidism of renal origin: Secondary | ICD-10-CM | POA: Diagnosis not present

## 2019-11-24 DIAGNOSIS — N186 End stage renal disease: Secondary | ICD-10-CM | POA: Diagnosis not present

## 2019-11-27 DIAGNOSIS — N12 Tubulo-interstitial nephritis, not specified as acute or chronic: Secondary | ICD-10-CM | POA: Diagnosis not present

## 2019-11-27 DIAGNOSIS — J9611 Chronic respiratory failure with hypoxia: Secondary | ICD-10-CM | POA: Diagnosis not present

## 2019-11-27 DIAGNOSIS — E1122 Type 2 diabetes mellitus with diabetic chronic kidney disease: Secondary | ICD-10-CM | POA: Diagnosis not present

## 2019-11-27 DIAGNOSIS — D509 Iron deficiency anemia, unspecified: Secondary | ICD-10-CM | POA: Diagnosis not present

## 2019-11-27 DIAGNOSIS — D631 Anemia in chronic kidney disease: Secondary | ICD-10-CM | POA: Diagnosis not present

## 2019-11-27 DIAGNOSIS — N186 End stage renal disease: Secondary | ICD-10-CM | POA: Diagnosis not present

## 2019-11-27 DIAGNOSIS — Z992 Dependence on renal dialysis: Secondary | ICD-10-CM | POA: Diagnosis not present

## 2019-11-27 DIAGNOSIS — D689 Coagulation defect, unspecified: Secondary | ICD-10-CM | POA: Diagnosis not present

## 2019-11-27 DIAGNOSIS — N2581 Secondary hyperparathyroidism of renal origin: Secondary | ICD-10-CM | POA: Diagnosis not present

## 2019-11-28 ENCOUNTER — Encounter (HOSPITAL_BASED_OUTPATIENT_CLINIC_OR_DEPARTMENT_OTHER): Payer: Medicare Other | Attending: Internal Medicine | Admitting: Internal Medicine

## 2019-11-28 ENCOUNTER — Other Ambulatory Visit: Payer: Self-pay

## 2019-11-28 DIAGNOSIS — D692 Other nonthrombocytopenic purpura: Secondary | ICD-10-CM | POA: Diagnosis not present

## 2019-11-28 DIAGNOSIS — E1122 Type 2 diabetes mellitus with diabetic chronic kidney disease: Secondary | ICD-10-CM | POA: Diagnosis not present

## 2019-11-28 DIAGNOSIS — Z86718 Personal history of other venous thrombosis and embolism: Secondary | ICD-10-CM | POA: Insufficient documentation

## 2019-11-28 DIAGNOSIS — L98498 Non-pressure chronic ulcer of skin of other sites with other specified severity: Secondary | ICD-10-CM | POA: Diagnosis not present

## 2019-11-28 DIAGNOSIS — Z9981 Dependence on supplemental oxygen: Secondary | ICD-10-CM | POA: Insufficient documentation

## 2019-11-28 DIAGNOSIS — Z992 Dependence on renal dialysis: Secondary | ICD-10-CM | POA: Diagnosis not present

## 2019-11-28 DIAGNOSIS — Z452 Encounter for adjustment and management of vascular access device: Secondary | ICD-10-CM | POA: Diagnosis not present

## 2019-11-28 DIAGNOSIS — S31104A Unspecified open wound of abdominal wall, left lower quadrant without penetration into peritoneal cavity, initial encounter: Secondary | ICD-10-CM | POA: Diagnosis not present

## 2019-11-28 DIAGNOSIS — Z6841 Body Mass Index (BMI) 40.0 and over, adult: Secondary | ICD-10-CM | POA: Insufficient documentation

## 2019-11-28 DIAGNOSIS — S2190XA Unspecified open wound of unspecified part of thorax, initial encounter: Secondary | ICD-10-CM | POA: Diagnosis not present

## 2019-11-28 DIAGNOSIS — N185 Chronic kidney disease, stage 5: Secondary | ICD-10-CM | POA: Diagnosis not present

## 2019-11-28 DIAGNOSIS — Z8616 Personal history of COVID-19: Secondary | ICD-10-CM | POA: Diagnosis not present

## 2019-11-28 DIAGNOSIS — Z86711 Personal history of pulmonary embolism: Secondary | ICD-10-CM | POA: Diagnosis not present

## 2019-11-29 DIAGNOSIS — E1129 Type 2 diabetes mellitus with other diabetic kidney complication: Secondary | ICD-10-CM | POA: Diagnosis not present

## 2019-11-29 DIAGNOSIS — D631 Anemia in chronic kidney disease: Secondary | ICD-10-CM | POA: Diagnosis not present

## 2019-11-29 DIAGNOSIS — N2581 Secondary hyperparathyroidism of renal origin: Secondary | ICD-10-CM | POA: Diagnosis not present

## 2019-11-29 DIAGNOSIS — Z992 Dependence on renal dialysis: Secondary | ICD-10-CM | POA: Diagnosis not present

## 2019-11-29 DIAGNOSIS — D689 Coagulation defect, unspecified: Secondary | ICD-10-CM | POA: Diagnosis not present

## 2019-11-29 DIAGNOSIS — D509 Iron deficiency anemia, unspecified: Secondary | ICD-10-CM | POA: Diagnosis not present

## 2019-11-29 DIAGNOSIS — N186 End stage renal disease: Secondary | ICD-10-CM | POA: Diagnosis not present

## 2019-11-29 NOTE — Progress Notes (Signed)
[name], [name] ([id number]) Visit Report for 11/28/2019 Debridement Details Patient Name: Date of Service: EBER, FERRUFINO [facility] [place] R. 11/28/2019 2:00 PM Medical Record Number: [id number] Patient Account Number: [id number] Date of Birth/Sex: Treating RN: [birth year] (54 y.o. [name]) Carlene Coria Primary Care Provider: Evelina Dun Other Clinician: Referring Provider: Treating Provider/Extender: Osker Mason in Treatment: 4 Debridement Performed for Assessment: Wound #2 Left,Lateral Abdomen - Lower Quadrant Performed By: Physician Ricard Dillon., MD Debridement Type: Debridement Level of Consciousness (Pre-procedure): Awake and Alert Pre-procedure Verification/Time Out Yes - 15:18 Taken: Start Time: 15:18 Pain Control: Lidocaine 5% topical ointment T Area Debrided (L x W): otal 1 (cm) x 1 (cm) = 1 (cm) Tissue and other material debrided: Viable, Non-Viable, Eschar, Subcutaneous, Skin: Dermis , Skin: Epidermis Level: Skin/Subcutaneous Tissue Debridement Description: Excisional Instrument: Blade, Forceps Bleeding: Moderate Hemostasis Achieved: Silver Nitrate End Time: 15:20 Procedural Pain: 0 Post Procedural Pain: 0 Response to Treatment: Procedure was tolerated well Level of Consciousness (Post- Awake and Alert procedure): Post Debridement Measurements of Total Wound Length: (cm) 9 Width: (cm) 12.5 Depth: (cm) 1.7 Volume: (cm) 150.207 Character of Wound/Ulcer Post Debridement: Improved Post Procedure Diagnosis Same as Pre-procedure Electronic Signature(s) Signed: 11/28/2019 5:52:05 PM By: Linton Ham MD Signed: 11/29/2019 4:36:43 PM By: Carlene Coria RN Entered By: Linton Ham on 11/28/2019 16:54:13 -------------------------------------------------------------------------------- HPI Details Patient Name: Date of Service: [name] [place] R. 11/28/2019 2:00 PM Medical Record Number: [id number] Patient Account Number: [id number] Date of Birth/Sex:  Treating RN: 07/04/1965 (54 y.o. [name] Primary Care Provider: Evelina Dun Other Clinician: Referring Provider: Treating Provider/Extender: Osker Mason in Treatment: 4 History of Present Illness HPI Description: Admission 10/30/2019 This is a 54 year old very disabled man with multiple medical issues. 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 His problems apparently started roughly 4 months ago s he noted expanding painful areas on the left lateral abdominal wall and the left lateral thorax. These were very painful and necrotic and have been expanding. It is really deteriorated for him in October when he had Covid and had to be hospitalized. This caused deterioration in his respiratory function including chronic oxygen at 3 to 4 L, onset of dialysis and weight loss of 60 pounds. Most recently has home health of implying silver alginate to these expanding wounds. He has been on 2 courses of doxycycline in early April and then once last week given to him by his primary doctor who had a telehealth visit. He is here for review of these issues. 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 Past medical history includes type 2 diabetes well controlled with a recent hemoglobin A1c of 6.6, chronic O2 requirements, morbid obesity, Coumadin secondary to history of DVT and PE, stage V chronic renal failure, BPH, seizure disorder although none recently, left AV fistula, he was seen by palliative care on 4/27 noted to be immobile with widespread arthritic pain high O2 requirements. He is however full code and I did not discuss advanced treatment issues 5/18; the patient has been on sodium thiosulfate at dialysis for 2 weeks. 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 He was kindly seen by Dr. Sidney Ace dermatology at [facility]. She also suggested stopping Coumadin Coumadin immediately and transitioning to a different anticoagulant. Also suggested stopping calcium containing phosphate binders and using alternative binders like Renvela although I am not exactly sure what if the patient is on a calcium containing phosphate binder. I have contacted the patient's primary physician at [facility] family Practice to stop the Coumadin. As noted 2 weeks ago I talked with Dr. Moshe Cipro at nephrology about thiosulfate. 6/1; the patient is now on Eliquis. Using Renvela as a phosphate binder. He has 2 large areas 1 under the left breast and one on the left abdomen. The area under the left breast actually looks some better today the left side of his abdomen looks about the same. He has been using Medihoney with calcium alginate on top. This is hopeful to allow some debridement of these areas. He also has an area of retiform purpura on his left anterior thigh however this actually looks somewhat better today a little less well-defined not is clearly marked. It is not opened which is fortunate Engineer, maintenance) Signed: 11/28/2019 5:52:05 PM By: Linton Ham MD Entered By: Linton Ham on 11/28/2019 16:56:19 -------------------------------------------------------------------------------- Physical Exam Details Patient Name: Date of Service: Patrick, [name] [place] R. 11/28/2019 2:00 PM Medical Record Number: [id number] Patient Account Number: [id number] Date of Birth/Sex: Treating RN: May 15, 1966 (54 y.o. [name] Primary Care Provider: Evelina Dun Other Clinician: Referring Provider: Treating Provider/Extender: Osker Mason in Treatment:  4 Constitutional Patient is hypotensive. He is awake and alert. Pulse regular and within target range for patient.Marland Kitchen Respirations regular, non-labored and within target range.. Very low-grade fever. Not quite as vibrant as I am used to seeing him. Almost seems listless at some point. Respiratory work of breathing is normal. Notes Wound exam; the area on the left abdominal wall had less erythema. Large area of necrotic eschar on the surface. I thought this was separating enough that I might try and remove some I used pickups and a #10 blade however he began bleeding almost immediately. We had used pressure and silver nitrate to achieve homeostasis. All in all move removed about 1 cm of this material The area under the left breast actually looks somewhat better less necrotic debris. The area of retiform purpura on the left anterior thigh looks somewhat better to me. Less well-defined later nontender Electronic Signature(s) Signed: 11/28/2019 5:52:05 PM By: Linton Ham MD Entered By: Linton Ham on 11/28/2019 16:58:22 -------------------------------------------------------------------------------- Physician Orders Details Patient Name: Date of Service: JR, [place] R. 11/28/2019 2:00 PM Medical Record Number: [id number] Patient Account Number: [id number] Date of Birth/Sex: Treating RN: 08-29-1965 (54 y.o. 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 Dressing Change Frequency Wound #1 Left,Inferior Flank Change Dressing every other day. Wound #2 Left,Lateral Abdomen - Lower Quadrant Change Dressing every other day. Wound Cleansing Wound #1 Left,Inferior Flank Clean wound with Wound Cleanser - or normal saline Wound #2 Left,Lateral Abdomen - Lower  Quadrant Clean wound with Wound Cleanser - or normal saline Primary Wound Dressing Wound #1 Left,Inferior Flank Medihoney Alginate Wound #2 Left,Lateral Abdomen - Lower Quadrant Medihoney Alginate Secondary Dressing Wound #1 Left,Inferior Flank Dry Gauze ABD pad - secure with tape Wound #2 Left,Lateral Abdomen - Lower Quadrant Dry Gauze ABD pad - secure with tape Off-Loading A fluidized (Group 3) mattress - Home Health please order [place] skilled nursing for wound care. - [facility] Electronic Signature(s) Signed: 11/28/2019 5:52:05 PM By: Linton Ham MD Signed: 11/29/2019 4:36:43 PM By: Carlene Coria RN Entered By: Carlene Coria on 11/28/2019 15:23:55 -------------------------------------------------------------------------------- Problem List Details Patient Name: Date of Service: DEMARQUS, JOCSON [facility] [place] R. 11/28/2019 2:00 PM Medical Record Number: [id number] Patient Account Number: [id number] Date of Birth/Sex: Treating RN: Nov 25, 1965 (54 y.o. [name] Primary Care Provider: Evelina Dun Other Clinician: Referring Provider: Treating Provider/Extender: Osker Mason in Treatment: 4 Active Problems ICD-10 Encounter Code Description Active Date MDM Diagnosis S31.104D Unspecified open wound of abdominal wall, left lower quadrant without 10/30/2019 No Yes penetration into peritoneal cavity, subsequent encounter L98.498 Non-pressure chronic ulcer of skin of other sites with other specified severity 10/30/2019 No Yes S21.90XD Unspecified open wound of  unspecified part of thorax, subsequent encounter 10/30/2019 No Yes E83.59 Other disorders of calcium metabolism 10/30/2019 No Yes Inactive Problems Resolved Problems Electronic Signature(s) Signed: 11/28/2019 5:52:05 PM By: Linton Ham MD Entered By: Linton Ham on 11/28/2019 16:53:38 -------------------------------------------------------------------------------- Progress Note Details Patient Name: Date of Service: [place] R. 11/28/2019 2:00 PM Medical Record Number: [id number] Patient Account Number: [id number] Date of Birth/Sex: Treating RN: 02-06-1966 (54 y.o. [name] Primary Care Provider: Evelina Dun Other Clinician: Referring Provider: Treating Provider/Extender: Geraldine Contras Weeks in Treatment: 4 Subjective History of Present Illness (HPI) Admission 10/30/2019 This is a 54 year old very disabled man with multiple medical issues. 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 His problems apparently started roughly 4 months ago s he noted expanding painful areas on the left lateral abdominal wall and the left lateral thorax. These were very painful and necrotic and have been expanding. It is really deteriorated for him in October when he had Covid and had to be hospitalized. This caused deterioration in his respiratory function including chronic oxygen at 3 to 4 L, onset of dialysis and weight loss of 60 pounds. Most recently has home health of implying silver alginate to these expanding wounds. He has been on 2 courses of doxycycline in early April and then once last week given to him by his primary doctor who had a telehealth visit. He is here for review of these issues. 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 Past  medical history includes type 2 diabetes well controlled with a recent hemoglobin A1c of 6.6, chronic O2 requirements, morbid obesity, Coumadin secondary to history of DVT and PE, stage V chronic renal failure, BPH, seizure disorder although none recently, left AV fistula, he was seen by palliative care on 4/27 noted to be immobile with widespread arthritic pain high O2 requirements. He is however full code and I did not discuss advanced treatment issues 5/18; the patient has been on sodium thiosulfate at dialysis for 2 weeks. 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 He was kindly seen by Dr. Sidney Ace dermatology at [facility]. She also suggested stopping Coumadin Coumadin immediately and transitioning to a different anticoagulant. Also suggested stopping calcium containing phosphate binders and using alternative binders like Renvela although I am not exactly sure what if the patient is on a calcium containing phosphate binder. I have contacted the patient's primary physician at [facility] family Practice to stop the Coumadin. As noted 2 weeks ago I talked with Dr. Moshe Cipro at nephrology about thiosulfate. 6/1; the patient is now on Eliquis. Using Renvela as a phosphate binder. He has 2 large areas 1 under the left breast and one on the left abdomen. The area under the left breast actually looks some better today the left side of his abdomen looks about the same. He has been using Medihoney with calcium alginate on top. This is hopeful to allow some debridement of these areas. He also has an area of retiform purpura on his left anterior thigh however this actually looks somewhat better today a little less well-defined not is clearly marked. It is not opened which is fortunate Objective Constitutional Patient is  hypotensive. He is awake and alert. Pulse regular and within target range for patient.Marland Kitchen Respirations regular, non-labored and within target range.. Very low-grade fever. Not quite as vibrant as I am used to seeing him. Almost seems listless at some point. Vitals Time Taken: 1:55 PM, Height: 75 in, Weight: 373 lbs, BMI: 46.6, Temperature: 99.1 F, Pulse: 84 bpm, Respiratory Rate: 18 breaths/min, Blood Pressure: 93/49 mmHg, Capillary Blood Glucose: 150 mg/dl. Respiratory work of breathing is normal. General Notes: Wound exam; the area on the left abdominal wall had less erythema. Large area of necrotic eschar on the surface. I thought this was separating enough that I might try and remove some I used pickups and a #10 blade however he began bleeding almost immediately. We had used pressure and silver nitrate to achieve homeostasis. All in all move removed about 1 cm of this material ooThe area under the left breast actually looks somewhat better less necrotic debris. ooThe area of retiform purpura on the left anterior thigh looks somewhat better to me. Less well-defined later nontender Integumentary (Hair, Skin) Wound #1 status is Open. Original cause of wound was Gradually Appeared. The wound is located on the Left,Inferior Flank. The wound measures 4.5cm length x 10.2cm width x 0.4cm depth; 36.05cm^2 area and 14.42cm^3 volume. There is Fat Layer (Subcutaneous Tissue) Exposed exposed. There is no tunneling or undermining noted. There is a medium amount of serosanguineous drainage noted. The wound margin is flat and intact. There is no granulation within the wound bed. There is a large (67-100%) amount of necrotic tissue within the wound bed including Eschar and Adherent Slough. Wound #2 status is Open. Original cause of wound was Gradually Appeared. The wound is located on the Left,Lateral Abdomen - Lower Quadrant. The wound measures 9cm length x 12.5cm width x 1.7cm depth; 88.357cm^2 area and  150.207cm^3 volume. There is Fat Layer (Subcutaneous Tissue) Exposed exposed. There is no tunneling or undermining noted. There is a large amount of serosanguineous drainage noted. The wound margin is flat and intact. There is no granulation within the wound bed. There is a large (67-100%) amount of necrotic tissue within the wound bed including Eschar and Adherent Slough. Wound #3 status is Open.  Original cause of wound was Shear/Friction. The wound is located on the Left,Superior Flank. The wound measures 1.1cm length x 1.5cm width x 0.1cm depth; 1.296cm^2 area and 0.13cm^3 volume. There is Fat Layer (Subcutaneous Tissue) Exposed exposed. There is no tunneling or undermining noted. There is a medium amount of serosanguineous drainage noted. The wound margin is flat and intact. There is large (67-100%) red granulation within the wound bed. There is no necrotic tissue within the wound bed. Assessment Active Problems ICD-10 Unspecified open wound of abdominal wall, left lower quadrant without penetration into peritoneal cavity, subsequent encounter Non-pressure chronic ulcer of skin of other sites with other specified severity Unspecified open wound of unspecified part of thorax, subsequent encounter Other disorders of calcium metabolism Procedures Wound #2 Pre-procedure diagnosis of Wound #2 is a Cellulitis located on the Left,Lateral Abdomen - Lower Quadrant . There was a Excisional Skin/Subcutaneous Tissue Debridement with a total area of 1 sq cm performed by Ricard Dillon., MD. With the following instrument(s): Blade, and Forceps to remove Viable and Non-Viable tissue/material. Material removed includes Eschar, Subcutaneous Tissue, Skin: Dermis, and Skin: Epidermis after achieving pain control using Lidocaine 5% topical ointment. No specimens were taken. A time out was conducted at 15:18, prior to the start of the procedure. A Moderate amount of bleeding was controlled with Silver Nitrate.  The procedure was tolerated well with a pain level of 0 throughout and a pain level of 0 following the procedure. Post Debridement Measurements: 9cm length x 12.5cm width x 1.7cm depth; 150.207cm^3 volume. Character of Wound/Ulcer Post Debridement is improved. Post procedure Diagnosis Wound #2: Same as Pre-Procedure Plan Follow-up Appointments: Return Appointment in 2 weeks. Dressing Change Frequency: Wound #1 Left,Inferior Flank: Change Dressing every other day. Wound #2 Left,Lateral Abdomen - Lower Quadrant: Change Dressing every other day. Wound Cleansing: Wound #1 Left,Inferior Flank: Clean wound with Wound Cleanser - or normal saline Wound #2 Left,Lateral Abdomen - Lower Quadrant: Clean wound with Wound Cleanser - or normal saline Primary Wound Dressing: Wound #1 Left,Inferior Flank: Medihoney Alginate Wound #2 Left,Lateral Abdomen - Lower Quadrant: Medihoney Alginate Secondary Dressing: Wound #1 Left,Inferior Flank: Dry Gauze ABD pad - secure with tape Wound #2 Left,Lateral Abdomen - Lower Quadrant: Dry Gauze ABD pad - secure with tape Off-Loading: Air fluidized (Group 3) mattress - Home Health please order Home Health: [place] skilled nursing for wound care. - [facility] 1. I have not changed the primary dressing which is Medihoney covered with calcium alginate. I we are gently getting some debridement of this 2. The patient asked me about Santyl. It would take an inordinate amount of Santyl to cover these areas and change daily. I told him I would put this through and see how much it would cost if he wishes. He seemed to think that Santyl and lidocaine on it which might of had something to do with the desire here. When I told him it does not have lidocaine he seems a little less inclined to want me to order it 3. He asked me about surgical debridement of this area. Clearly we do have to find a surgeon to do this and an anesthesiologist to be willing to put him  under general anesthetic. This would be a difficult combination of things. I will see if I can find out an answer by the next time they are here. 4. I am not going to order Trental until I see how we do with the Eliquis. 5. I attempted to take some  of the large eschar off this patient's abdominal wall wound today. There is simply too much bleeding Electronic Signature(s) Signed: 11/28/2019 5:52:05 PM By: Linton Ham MD Entered By: Linton Ham on 11/28/2019 17:02:36 -------------------------------------------------------------------------------- SuperBill Details Patient Name: Date of Service: [name] [place] R. 11/28/2019 Medical Record Number: [id number] Patient Account Number: [id number] Date of Birth/Sex: Treating RN: 10-12-1965 (54 y.o. [name]) Carlene Coria Primary Care Provider: Evelina Dun Other Clinician: Referring Provider: Treating Provider/Extender: Osker Mason in Treatment: 4 Diagnosis Coding ICD-10 Codes Code Description S31.104D Unspecified open wound of abdominal wall, left lower quadrant without penetration into peritoneal cavity, subsequent encounter L98.498 Non-pressure chronic ulcer of skin of other sites with other specified severity S21.90XD Unspecified open wound of unspecified part of thorax, subsequent encounter E83.59 Other disorders of calcium metabolism Facility Procedures CPT4 Code Description: [id number] - DEB SUBQ TISSUE 20 SQ CM/< ICD-10 Diagnosis Description S31.104D Unspecified open wound of abdominal wall, left lower quadrant without penetration encounter Modifier: into peritoneal cavi Quantity: 1 ty, subsequent Physician Procedures : CPT4 Code Description Modifier [id number] - WC PHYS SUBQ TISS 20 SQ CM ICD-10 Diagnosis Description S31.104D Unspecified open wound of abdominal wall, left lower quadrant without penetration into peritoneal cavity, encounter Quantity: 1 subsequent Electronic Signature(s) Signed:  11/28/2019 5:52:05 PM By: Linton Ham MD Entered By: Linton Ham on 11/28/2019 17:02:52

## 2019-11-30 DIAGNOSIS — J9611 Chronic respiratory failure with hypoxia: Secondary | ICD-10-CM | POA: Diagnosis not present

## 2019-11-30 DIAGNOSIS — S21102D Unspecified open wound of left front wall of thorax without penetration into thoracic cavity, subsequent encounter: Secondary | ICD-10-CM | POA: Diagnosis not present

## 2019-11-30 DIAGNOSIS — E1122 Type 2 diabetes mellitus with diabetic chronic kidney disease: Secondary | ICD-10-CM | POA: Diagnosis not present

## 2019-11-30 DIAGNOSIS — Z86711 Personal history of pulmonary embolism: Secondary | ICD-10-CM | POA: Diagnosis not present

## 2019-11-30 DIAGNOSIS — S31104D Unspecified open wound of abdominal wall, left lower quadrant without penetration into peritoneal cavity, subsequent encounter: Secondary | ICD-10-CM | POA: Diagnosis not present

## 2019-11-30 DIAGNOSIS — N185 Chronic kidney disease, stage 5: Secondary | ICD-10-CM | POA: Diagnosis not present

## 2019-11-30 DIAGNOSIS — M6281 Muscle weakness (generalized): Secondary | ICD-10-CM | POA: Diagnosis not present

## 2019-11-30 NOTE — Progress Notes (Addendum)
[name] ([id number]) Visit Report for 11/28/2019 Arrival Information Details Patient Name: Date of Service: [name], [name] [facility] [place] R. 11/28/2019 2:00 PM Medical Record Number: [id number] Patient Account Number: [id number] Date of Birth/Sex: Treating RN: 1966-06-17 (54 y.o. [name]) Carlene Coria Primary Care Lori Liew: Evelina Dun Other Clinician: Referring Absalom Aro: Treating Tiamarie Furnari/Extender: Osker Mason in Treatment: 4 Visit Information History Since Last Visit Added or deleted any medications: No Patient Arrived: Wheel Chair Any Patrick allergies or adverse reactions: No Arrival Time: 13:55 Had a fall or experienced change in No Accompanied By: wife activities of daily living that may affect Transfer Assistance: None risk of falls: Patient Identification Verified: Yes Signs or symptoms of abuse/neglect since last visito No Secondary Verification Process Completed: Yes Hospitalized since last visit: No Patient Requires Transmission-Based Precautions: No Implantable device outside of the clinic excluding No Patient Has Alerts: Yes cellular tissue based products placed in the center Patient Alerts: Patient on Blood Thinner since last visit: Has Dressing in Place as Prescribed: Yes Pain Present Now: Yes Electronic Signature(s) Signed: 11/30/2019 9:12:52 AM By: Sandre Kitty Entered By: Sandre Kitty on 11/28/2019 13:55:52 -------------------------------------------------------------------------------- Encounter Discharge Information Details Patient Name: Date of Service: [name], [name]. 11/28/2019 2:00 PM Medical Record Number: [id number] Patient Account Number: [id number] Date of Birth/Sex: Treating RN: 03-01-1966 (54 y.o. [name] Primary Care Torianna Junio: Evelina Dun Other Clinician: Referring Briceyda Abdullah: Treating Krystine Pabst/Extender: Osker Mason in Treatment: 4 Encounter Discharge Information Items Post  Procedure Vitals Discharge Condition: Stable Temperature (F): 99.1 Ambulatory Status: Wheelchair Pulse (bpm): 84 Discharge Destination: Home Respiratory Rate (breaths/min): 18 Transportation: Private Auto Blood Pressure (mmHg): 93/49 Accompanied By: wife Schedule Follow-up Appointment: Yes Clinical Summary of Care: Patient Declined Electronic Signature(s) Signed: 11/29/2019 7:13:26 AM By: Kela Millin Entered By: Kela Millin on 11/28/2019 15:52:07 -------------------------------------------------------------------------------- Lower Extremity Assessment Details Patient Name: Date of Service: [name], [place] R. 11/28/2019 2:00 PM Medical Record Number: [id number] Patient Account Number: [id number] Date of Birth/Sex: Treating RN: 1965/10/18 (54 y.o. [name] Primary Care Aldyn Toon: Evelina Dun Other Clinician: Referring Armie Moren: Treating Brexley Cutshaw/Extender: Geraldine Contras Weeks in Treatment: 4 Electronic Signature(s) Signed: 11/28/2019 5:54:40 PM By: Baruch Gouty RN, BSN Entered By: Baruch Gouty on 11/28/2019 14:17:42 -------------------------------------------------------------------------------- Multi Wound Chart Details Patient Name: Date of Service: Patrick [place] R. 11/28/2019 2:00 PM Medical Record Number: [id number] Patient Account Number: [id number] Date of Birth/Sex: Treating RN: January 25, 1966 (54 y.o. [name]) Carlene Coria Primary Care Nikai Quest: Evelina Dun Other Clinician: Referring Jaxten Brosh: Treating Faithe Ariola/Extender: Geraldine Contras Weeks in Treatment: 4 Vital Signs Height(in): 75 Capillary Blood Glucose(mg/dl): 150 Weight(lbs): 373 Pulse(bpm): 107 Body Mass Index(BMI): 47 Blood Pressure(mmHg): 93/49 Temperature(F): 99.1 Respiratory Rate(breaths/min): 18 Photos: [1:No Photos Left, Inferior Flank] [2:No Photos Left, Lateral Abdomen - Lower] [3:No Photos Left, Superior Flank] Wound Location:  [1:Gradually Appeared] [2:Quadrant Gradually Appeared] [3:Shear/Friction] Wounding Event: [1:Cellulitis] [2:Cellulitis] [3:Abrasion] Primary Etiology: [1:Anemia, Type II Diabetes,] [2:Anemia, Type II Diabetes,] [3:Anemia, Type II Diabetes,] Comorbid History: [1:Osteoarthritis 08/14/2019] [2:Osteoarthritis 08/14/2019] [3:Osteoarthritis 11/28/2019] Date Acquired: [1:4] [2:4] [3:0] Weeks of Treatment: [1:Open] [2:Open] [3:Open] Wound Status: [1:4.5x10.2x0.4] [2:9x12.5x1.7] [3:1.1x1.5x0.1] Measurements L x W x D (cm) [1:36.05] [2:88.357] [3:1.296] A (cm) : rea [1:14.42] [2:150.207] [3:0.13] Volume (cm) : [1:-99.60%] [2:-108.30%] [3:0.00%] % Reduction in Area: [1:-698.40%] [2:-3441.80%] [3:0.00%] % Reduction in Volume: [1:Full Thickness Without Exposed] [2:Full Thickness Without Exposed] [3:Full Thickness Without Exposed] Classification: [1:Support Structures Medium] [2:Support Structures Large] [3:Support Structures Medium] Exudate A mount: [1:Serosanguineous] [2:Serosanguineous] [  3:Serosanguineous] Exudate Type: [1:red, brown] [2:red, brown] [3:red, brown] Exudate Color: [1:Flat and Intact] [2:Flat and Intact] [3:Flat and Intact] Wound Margin: [1:None Present (0%)] [2:None Present (0%)] [3:Large (67-100%)] Granulation Amount: [1:N/A] [2:N/A] [3:Red] Granulation Quality: [1:Large (67-100%)] [2:Large (67-100%)] [3:None Present (0%)] Necrotic Amount: [1:Eschar, Adherent Slough] [2:Eschar, Adherent Slough] [3:N/A] Necrotic Tissue: [1:Fat Layer (Subcutaneous Tissue)] [2:Fat Layer (Subcutaneous Tissue)] [3:Fat Layer (Subcutaneous Tissue)] Exposed Structures: [1:Exposed: Yes Fascia: No Tendon: No Muscle: No Joint: No Bone: No Small (1-33%)] [2:Exposed: Yes Fascia: No Tendon: No Muscle: No Joint: No Bone: No None] [3:Exposed: Yes Fascia: No Tendon: No Muscle: No Joint: No Bone: No Small (1-33%)] Epithelialization: [1:N/A Debridement - Excisional] [2:N/A] Debridement: [1:N/A 15:18] [2:N/A] Pre-procedure  Verification/Time Out Taken: [1:N/A Lidocaine 5% topical ointment] [2:N/A] Pain Control: [1:N/A Necrotic/Eschar, Subcutaneous] [2:N/A] Tissue Debrided: [1:N/A Skin/Subcutaneous Tissue] [2:N/A] Level: [1:N/A 1] [2:N/A] Debridement A (sq cm): [1:rea N/A Blade, Forceps] [2:N/A] Instrument: [1:N/A Moderate] [2:N/A] Bleeding: [1:N/A Silver Nitrate] [2:N/A] Hemostasis A chieved: [1:N/A 0] [2:N/A] Procedural Pain: [1:N/A 0] [2:N/A] Post Procedural Pain: [1:N/A Procedure was tolerated well] [2:N/A] Debridement Treatment Response: [1:N/A 9x12.5x1.7] [2:N/A] Post Debridement Measurements L x W x D (cm) [1:N/A 150.207] [2:N/A] Post Debridement Volume: (cm) [1:N/A Debridement] [2:N/A] Treatment Notes Wound #1 (Left, Inferior Flank) 1. Cleanse With Wound Cleanser 2. Periwound Care Skin Prep 3. Primary Dressing Applied Calcium Alginate Other primary dressing (specifiy in notes) 4. Secondary Dressing ABD Pad Dry Gauze 5. Secured With Medipore tape Notes medihoney Wound #2 (Left, Lateral Abdomen - Lower Quadrant) 1. Cleanse With Wound Cleanser 2. Periwound Care Skin Prep 3. Primary Dressing Applied Calcium Alginate Other primary dressing (specifiy in notes) 4. Secondary Dressing ABD Pad Dry Gauze 5. Secured With Medipore tape Notes medihoney Wound #3 (Left, Superior Flank) 1. Cleanse With Wound Cleanser 2. Periwound Care Skin Prep 3. Primary Dressing Applied Calcium Alginate Other primary dressing (specifiy in notes) 4. Secondary Dressing ABD Pad Dry Gauze 5. Secured With [employer] tape Notes Science writer) Signed: 11/28/2019 5:52:05 PM By: Linton Ham MD Signed: 11/29/2019 4:36:43 PM By: Carlene Coria RN Entered By: Linton Ham on 11/28/2019 16:53:46 -------------------------------------------------------------------------------- [place] Details Patient Name: Date of Service: [name], [place] R. 11/28/2019 2:00  PM Medical Record Number: [id number] Patient Account Number: [id number] Date of Birth/Sex: Treating RN: 05/05/1966 (54 y.o. [name] Primary Care Cord Wilczynski: Evelina Dun Other Clinician: Referring Wilgus Deyton: Treating Anahis Furgeson/Extender: Osker Mason in Treatment: 4 Active Inactive Abuse / Safety / Falls / Self Care Management Nursing Diagnoses: Potential for falls Potential for injury related to falls Goals: Patient will not experience any injury related to falls Date Initiated: 10/30/2019 Target Resolution Date: 12/01/2019 Goal Status: Active Patient/caregiver will verbalize/demonstrate measures taken to prevent injury and/or falls Date Initiated: 10/30/2019 Target Resolution Date: 12/01/2019 Goal Status: Active Interventions: Assess Activities of Daily Living upon admission and as needed Assess fall risk on admission and as needed Assess: immobility, friction, shearing, incontinence upon admission and as needed Assess impairment of mobility on admission and as needed per policy Assess personal safety and home safety (as indicated) on admission and as needed Assess self care needs on admission and as needed Provide education on fall prevention Provide education on personal and home safety Notes: Nutrition Nursing Diagnoses: Impaired glucose control: actual or potential Potential for alteratiion in Nutrition/Potential for imbalanced nutrition Goals: Patient/caregiver agrees to and verbalizes understanding of need to use nutritional supplements and/or vitamins as prescribed Date Initiated: 10/30/2019 Target Resolution Date: 12/01/2019 Goal Status:  Active Patient/caregiver will maintain therapeutic glucose control Date Initiated: 10/30/2019 Target Resolution Date: 12/01/2019 Goal Status: Active Interventions: Assess HgA1c results as ordered upon admission and as needed Assess patient nutrition upon admission and as needed per policy Provide education on  elevated blood sugars and impact on wound healing Provide education on nutrition Treatment Activities: Education provided on Nutrition : 11/14/2019 Notes: Wound/Skin Impairment Nursing Diagnoses: Impaired tissue integrity Knowledge deficit related to ulceration/compromised skin integrity Goals: Patient/caregiver will verbalize understanding of skin care regimen Date Initiated: 10/30/2019 Target Resolution Date: 12/01/2019 Goal Status: Active Interventions: Assess patient/caregiver ability to obtain necessary supplies Assess patient/caregiver ability to perform ulcer/skin care regimen upon admission and as needed Assess ulceration(s) every visit Provide education on ulcer and skin care Notes: Electronic Signature(s) Signed: 11/29/2019 4:36:43 PM By: Carlene Coria RN Entered By: Carlene Coria on 11/28/2019 13:42:34 -------------------------------------------------------------------------------- Pain Assessment Details Patient Name: Date of Service: [place], [place] R. 11/28/2019 2:00 PM Medical Record Number: [id number] Patient Account Number: [id number] Date of Birth/Sex: Treating RN: Apr 17, 1966 (54 y.o. [name] Primary Care Markie Heffernan: Evelina Dun Other Clinician: Referring Othman Masur: Treating Victor Langenbach/Extender: Geraldine Contras Weeks in Treatment: 4 Active Problems Location of Pain Severity and Description of Pain Patient Has Paino Yes Site Locations Rate the pain. Current Pain Level: 10 Pain Management and Medication Current Pain Management: Electronic Signature(s) Signed: 11/29/2019 4:36:43 PM By: Carlene Coria RN Signed: 11/30/2019 9:12:52 AM By: Sandre Kitty Entered By: Sandre Kitty on 11/28/2019 13:56:20 -------------------------------------------------------------------------------- Patient/Caregiver Education Details Patient Name: Date of Service: [name], [name] 6/1/2021andnbsp2:00 PM Medical Record Number: [id number] Patient Account  Number: [id number] Date of Birth/Gender: Treating RN: Apr 18, 1966 (54 y.o. [name] Primary Care Physician: Evelina Dun Other Clinician: Referring Physician: Treating Physician/Extender: Osker Mason in Treatment: 4 Education Assessment Education Provided To: Patient Education Topics Provided Safety: Methods: Explain/Verbal Responses: State content correctly Wound/Skin Impairment: Methods: Explain/Verbal Responses: State content correctly Electronic Signature(s) Signed: 11/29/2019 4:36:43 PM By: Carlene Coria RN Entered By: Carlene Coria on 11/28/2019 13:42:57 -------------------------------------------------------------------------------- Wound Assessment Details Patient Name: Date of Service: [name], [place] R. 11/28/2019 2:00 PM Medical Record Number: [id number] Patient Account Number: [id number] Date of Birth/Sex: Treating RN: [birth year] (55 y.o. [name] Primary Care Rafe Mackowski: Evelina Dun Other Clinician: Referring [place] Sarinana: Treating Vona Whiters/Extender: Geraldine Contras Weeks in Treatment: 4 Wound Status Wound Number: 1 Primary Etiology: Cellulitis Wound Location: Left, Inferior Flank Wound Status: Open Wounding Event: Gradually Appeared Comorbid History: Anemia, Type II Diabetes, Osteoarthritis Date Acquired: 08/14/2019 Weeks Of Treatment: 4 Clustered Wound: No Photos Wound Measurements Length: (cm) 4.5 Width: (cm) 10.2 Depth: (cm) 0.4 Area: (cm) 36.05 Volume: (cm) 14.42 % Reduction in Area: -99.6% % Reduction in Volume: -698.4% Epithelialization: Small (1-33%) Tunneling: No Undermining: No Wound Description Classification: Full Thickness Without Exposed Support Structures Wound Margin: Flat and Intact Exudate Amount: Medium Exudate Type: Serosanguineous Exudate Color: red, brown Foul Odor After Cleansing: No Slough/Fibrino Yes Wound Bed Granulation Amount: None Present (0%) Exposed  Structure Necrotic Amount: Large (67-100%) Fascia Exposed: No Necrotic Quality: Eschar, Adherent Slough Fat Layer (Subcutaneous Tissue) Exposed: Yes Tendon Exposed: No Muscle Exposed: No Joint Exposed: No Bone Exposed: No Treatment Notes Wound #1 (Left, Inferior Flank) 1. Cleanse With Wound Cleanser 2. Periwound Care Skin Prep 3. Primary Dressing Applied Calcium Alginate Other primary dressing (specifiy in notes) 4. Secondary Dressing ABD Pad Dry Gauze 5. Secured With [employer] tape Notes Science writer) Signed: 11/30/2019 4:03:29 PM By: Ronnald Ramp,  [place] EMT/HBOT Signed: 11/30/2019 5:32:26 PM By: Baruch Gouty RN, BSN Previous Signature: 11/28/2019 5:54:40 PM Version By: Baruch Gouty RN, BSN Entered By: Mikeal Hawthorne on 11/30/2019 11:55:24 -------------------------------------------------------------------------------- Wound Assessment Details Patient Name: Date of Service: [name], Patrick [place] R. 11/28/2019 2:00 PM Medical Record Number: [id number] Patient Account Number: [id number] Date of Birth/Sex: Treating RN: 07-05-1965 (53 y.o. [name] Primary Care Dakoda Laventure: Evelina Dun Other Clinician: Referring Ratasha Fabre: Treating Cariah Salatino/Extender: Geraldine Contras Weeks in Treatment: 4 Wound Status Wound Number: 2 Primary Etiology: Cellulitis Wound Location: Left, Lateral Abdomen - Lower Quadrant Wound Status: Open Wounding Event: Gradually Appeared Comorbid History: Anemia, Type II Diabetes, Osteoarthritis Date Acquired: 08/14/2019 Weeks Of Treatment: 4 Clustered Wound: No Photos Photo Uploaded By: Mikeal Hawthorne on 11/30/2019 11:54:29 Wound Measurements Length: (cm) 9 Width: (cm) 12.5 Depth: (cm) 1.7 Area: (cm) 88.357 Volume: (cm) 150.207 % Reduction in Area: -108.3% % Reduction in Volume: -3441.8% Epithelialization: None Tunneling: No Undermining: No Wound Description Classification: Full Thickness Without Exposed  Support Structu Wound Margin: Flat and Intact Exudate Amount: Large Exudate Type: Serosanguineous Exudate Color: red, brown res Foul Odor After Cleansing: No Slough/Fibrino Yes Wound Bed Granulation Amount: None Present (0%) Exposed Structure Necrotic Amount: Large (67-100%) Fascia Exposed: No Necrotic Quality: Eschar, Adherent Slough Fat Layer (Subcutaneous Tissue) Exposed: Yes Tendon Exposed: No Muscle Exposed: No Joint Exposed: No Bone Exposed: No Treatment Notes Wound #2 (Left, Lateral Abdomen - Lower Quadrant) 1. Cleanse With Wound Cleanser 2. Periwound Care Skin Prep 3. Primary Dressing Applied Calcium Alginate Other primary dressing (specifiy in notes) 4. Secondary Dressing ABD Pad Dry Gauze 5. Secured With [employer] tape Notes Science writer) Signed: 11/28/2019 5:54:40 PM By: Baruch Gouty RN, BSN Entered By: Baruch Gouty on 11/28/2019 14:21:50 -------------------------------------------------------------------------------- Wound Assessment Details Patient Name: Date of Service: [name], [name] 11/28/2019 2:00 PM Medical Record Number: [id number] Patient Account Number: [id number] Date of Birth/Sex: Treating RN: May 03, 1966 (54 y.o. [name]) Carlene Coria Primary Care Diante Barley: Evelina Dun Other Clinician: Referring Nahla Lukin: Treating Shirl Weir/Extender: Geraldine Contras Weeks in Treatment: 4 Wound Status Wound Number: 3 Primary Etiology: Abrasion Wound Location: Left, Superior Flank Wound Status: Open Wounding Event: Shear/Friction Comorbid History: Anemia, Type II Diabetes, Osteoarthritis Date Acquired: 11/28/2019 Weeks Of Treatment: 0 Clustered Wound: No Photos Photo Uploaded By: Mikeal Hawthorne on 11/30/2019 11:51:46 Wound Measurements Length: (cm) 1.1 Width: (cm) 1.5 Depth: (cm) 0.1 Area: (cm) 1.296 Volume: (cm) 0.13 % Reduction in Area: 0% % Reduction in Volume: 0% Epithelialization: Small  (1-33%) Tunneling: No Undermining: No Wound Description Classification: Full Thickness Without Exposed Support Structures Wound Margin: Flat and Intact Exudate Amount: Medium Exudate Type: Serosanguineous Exudate Color: red, brown Foul Odor After Cleansing: No Slough/Fibrino No Wound Bed Granulation Amount: Large (67-100%) Exposed Structure Granulation Quality: Red Fascia Exposed: No Necrotic Amount: None Present (0%) Fat Layer (Subcutaneous Tissue) Exposed: Yes Tendon Exposed: No Muscle Exposed: No Joint Exposed: No Bone Exposed: No Treatment Notes Wound #3 (Left, Superior Flank) 1. Cleanse With Wound Cleanser 2. Periwound Care Skin Prep 3. Primary Dressing Applied Calcium Alginate Other primary dressing (specifiy in notes) 4. Secondary Dressing ABD Pad Dry Gauze 5. Secured With [employer] tape Notes Science writer) Signed: 11/28/2019 5:54:40 PM By: Baruch Gouty RN, BSN Signed: 11/29/2019 4:36:43 PM By: Carlene Coria RN Entered By: Baruch Gouty on 11/28/2019 14:22:30 -------------------------------------------------------------------------------- Vitals Details Patient Name: Date of Service: [name] 11/28/2019 2:00 PM Medical Record Number: [id number] Patient Account Number: [id number] Date of  Birth/Sex: Treating RN: 1965/09/10 (54 y.o. [name]) Carlene Coria Primary Care Hanzel Pizzo: Evelina Dun Other Clinician: Referring Dyasia Firestine: Treating Vernette Moise/Extender: Osker Mason in Treatment: 4 Vital Signs Time Taken: 13:55 Temperature (F): 99.1 Height (in): 75 Pulse (bpm): 84 Weight (lbs): 373 Respiratory Rate (breaths/min): 18 Body Mass Index (BMI): 46.6 Blood Pressure (mmHg): 93/49 Capillary Blood Glucose (mg/dl): 150 Reference Range: 80 - 120 mg / dl Electronic Signature(s) Signed: 11/30/2019 9:12:52 AM By: Sandre Kitty Entered By: Sandre Kitty on 11/28/2019 13:56:31

## 2019-12-01 DIAGNOSIS — D509 Iron deficiency anemia, unspecified: Secondary | ICD-10-CM | POA: Diagnosis not present

## 2019-12-01 DIAGNOSIS — D689 Coagulation defect, unspecified: Secondary | ICD-10-CM | POA: Diagnosis not present

## 2019-12-01 DIAGNOSIS — D631 Anemia in chronic kidney disease: Secondary | ICD-10-CM | POA: Diagnosis not present

## 2019-12-01 DIAGNOSIS — N186 End stage renal disease: Secondary | ICD-10-CM | POA: Diagnosis not present

## 2019-12-01 DIAGNOSIS — N2581 Secondary hyperparathyroidism of renal origin: Secondary | ICD-10-CM | POA: Diagnosis not present

## 2019-12-01 DIAGNOSIS — Z992 Dependence on renal dialysis: Secondary | ICD-10-CM | POA: Diagnosis not present

## 2019-12-01 DIAGNOSIS — E1129 Type 2 diabetes mellitus with other diabetic kidney complication: Secondary | ICD-10-CM | POA: Diagnosis not present

## 2019-12-04 DIAGNOSIS — Z992 Dependence on renal dialysis: Secondary | ICD-10-CM | POA: Diagnosis not present

## 2019-12-04 DIAGNOSIS — N2581 Secondary hyperparathyroidism of renal origin: Secondary | ICD-10-CM | POA: Diagnosis not present

## 2019-12-04 DIAGNOSIS — D689 Coagulation defect, unspecified: Secondary | ICD-10-CM | POA: Diagnosis not present

## 2019-12-04 DIAGNOSIS — D631 Anemia in chronic kidney disease: Secondary | ICD-10-CM | POA: Diagnosis not present

## 2019-12-04 DIAGNOSIS — D509 Iron deficiency anemia, unspecified: Secondary | ICD-10-CM | POA: Diagnosis not present

## 2019-12-04 DIAGNOSIS — E1129 Type 2 diabetes mellitus with other diabetic kidney complication: Secondary | ICD-10-CM | POA: Diagnosis not present

## 2019-12-04 DIAGNOSIS — N186 End stage renal disease: Secondary | ICD-10-CM | POA: Diagnosis not present

## 2019-12-06 DIAGNOSIS — Z992 Dependence on renal dialysis: Secondary | ICD-10-CM | POA: Diagnosis not present

## 2019-12-06 DIAGNOSIS — N186 End stage renal disease: Secondary | ICD-10-CM | POA: Diagnosis not present

## 2019-12-06 DIAGNOSIS — D689 Coagulation defect, unspecified: Secondary | ICD-10-CM | POA: Diagnosis not present

## 2019-12-06 DIAGNOSIS — D631 Anemia in chronic kidney disease: Secondary | ICD-10-CM | POA: Diagnosis not present

## 2019-12-06 DIAGNOSIS — D509 Iron deficiency anemia, unspecified: Secondary | ICD-10-CM | POA: Diagnosis not present

## 2019-12-06 DIAGNOSIS — E1129 Type 2 diabetes mellitus with other diabetic kidney complication: Secondary | ICD-10-CM | POA: Diagnosis not present

## 2019-12-06 DIAGNOSIS — N2581 Secondary hyperparathyroidism of renal origin: Secondary | ICD-10-CM | POA: Diagnosis not present

## 2019-12-07 ENCOUNTER — Ambulatory Visit (HOSPITAL_COMMUNITY): Payer: Medicare Other

## 2019-12-07 DIAGNOSIS — S31104D Unspecified open wound of abdominal wall, left lower quadrant without penetration into peritoneal cavity, subsequent encounter: Secondary | ICD-10-CM | POA: Diagnosis not present

## 2019-12-07 DIAGNOSIS — M6281 Muscle weakness (generalized): Secondary | ICD-10-CM | POA: Diagnosis not present

## 2019-12-07 DIAGNOSIS — E1122 Type 2 diabetes mellitus with diabetic chronic kidney disease: Secondary | ICD-10-CM | POA: Diagnosis not present

## 2019-12-07 DIAGNOSIS — J9611 Chronic respiratory failure with hypoxia: Secondary | ICD-10-CM | POA: Diagnosis not present

## 2019-12-07 DIAGNOSIS — N185 Chronic kidney disease, stage 5: Secondary | ICD-10-CM | POA: Diagnosis not present

## 2019-12-07 DIAGNOSIS — S21102D Unspecified open wound of left front wall of thorax without penetration into thoracic cavity, subsequent encounter: Secondary | ICD-10-CM | POA: Diagnosis not present

## 2019-12-07 DIAGNOSIS — Z86711 Personal history of pulmonary embolism: Secondary | ICD-10-CM | POA: Diagnosis not present

## 2019-12-08 ENCOUNTER — Telehealth: Payer: Self-pay | Admitting: Internal Medicine

## 2019-12-08 ENCOUNTER — Telehealth: Payer: Self-pay | Admitting: Cardiology

## 2019-12-08 DIAGNOSIS — D509 Iron deficiency anemia, unspecified: Secondary | ICD-10-CM | POA: Diagnosis not present

## 2019-12-08 DIAGNOSIS — D689 Coagulation defect, unspecified: Secondary | ICD-10-CM | POA: Diagnosis not present

## 2019-12-08 DIAGNOSIS — N186 End stage renal disease: Secondary | ICD-10-CM | POA: Diagnosis not present

## 2019-12-08 DIAGNOSIS — N2581 Secondary hyperparathyroidism of renal origin: Secondary | ICD-10-CM | POA: Diagnosis not present

## 2019-12-08 DIAGNOSIS — E1129 Type 2 diabetes mellitus with other diabetic kidney complication: Secondary | ICD-10-CM | POA: Diagnosis not present

## 2019-12-08 DIAGNOSIS — Z992 Dependence on renal dialysis: Secondary | ICD-10-CM | POA: Diagnosis not present

## 2019-12-08 DIAGNOSIS — D631 Anemia in chronic kidney disease: Secondary | ICD-10-CM | POA: Diagnosis not present

## 2019-12-08 NOTE — Telephone Encounter (Signed)
Called this morning to reschedule CTA chest/aorta that was ordered by Dr. Allegra Lai cancelled 11/14/19--11/23/19 and 12/07/19.  Patient's wife states he has several "pretty bad" wounds" that he is dealing with and the CT scan is no longer necessary.

## 2019-12-08 NOTE — Telephone Encounter (Signed)
Left message for pt to call.

## 2019-12-08 NOTE — Telephone Encounter (Signed)
Scheduled [facility] Palliative visit for 12-12-19 at 2:30.

## 2019-12-11 ENCOUNTER — Telehealth: Payer: Self-pay | Admitting: Family

## 2019-12-11 DIAGNOSIS — Z992 Dependence on renal dialysis: Secondary | ICD-10-CM | POA: Diagnosis not present

## 2019-12-11 DIAGNOSIS — N186 End stage renal disease: Secondary | ICD-10-CM | POA: Diagnosis not present

## 2019-12-11 DIAGNOSIS — N2581 Secondary hyperparathyroidism of renal origin: Secondary | ICD-10-CM | POA: Diagnosis not present

## 2019-12-11 DIAGNOSIS — R040 Epistaxis: Secondary | ICD-10-CM

## 2019-12-11 DIAGNOSIS — D509 Iron deficiency anemia, unspecified: Secondary | ICD-10-CM | POA: Diagnosis not present

## 2019-12-11 DIAGNOSIS — D689 Coagulation defect, unspecified: Secondary | ICD-10-CM | POA: Diagnosis not present

## 2019-12-11 DIAGNOSIS — D631 Anemia in chronic kidney disease: Secondary | ICD-10-CM | POA: Diagnosis not present

## 2019-12-11 DIAGNOSIS — E1129 Type 2 diabetes mellitus with other diabetic kidney complication: Secondary | ICD-10-CM | POA: Diagnosis not present

## 2019-12-11 MED ORDER — APIXABAN 2.5 MG PO TABS: 2.5000 mg | ORAL_TABLET | Freq: Two times a day (BID) | ORAL | 2 refills | Status: AC

## 2019-12-11 NOTE — Telephone Encounter (Signed)
CBC ordered.With Eliquis we do not check INR. We will decrease Eliquis to 2.5 mg BID.

## 2019-12-11 NOTE — Telephone Encounter (Signed)
Person who answered reported this is the wrong number.

## 2019-12-11 NOTE — Telephone Encounter (Signed)
Patient aware and verbalized understanding. °

## 2019-12-12 ENCOUNTER — Encounter (HOSPITAL_BASED_OUTPATIENT_CLINIC_OR_DEPARTMENT_OTHER): Payer: Medicare Other | Admitting: Internal Medicine

## 2019-12-12 ENCOUNTER — Other Ambulatory Visit: Payer: Medicare Other | Admitting: Internal Medicine

## 2019-12-12 ENCOUNTER — Other Ambulatory Visit: Payer: Self-pay

## 2019-12-12 ENCOUNTER — Other Ambulatory Visit (HOSPITAL_COMMUNITY)
Admission: RE | Admit: 2019-12-12 | Discharge: 2019-12-12 | Disposition: A | Discharge status: Home / Self Care | Payer: Medicare Other | Source: Ambulatory Visit | Attending: Family | Admitting: Family

## 2019-12-12 DIAGNOSIS — Z86718 Personal history of other venous thrombosis and embolism: Secondary | ICD-10-CM | POA: Diagnosis not present

## 2019-12-12 DIAGNOSIS — R040 Epistaxis: Secondary | ICD-10-CM | POA: Insufficient documentation

## 2019-12-12 DIAGNOSIS — J449 Chronic obstructive pulmonary disease, unspecified: Secondary | ICD-10-CM | POA: Diagnosis not present

## 2019-12-12 DIAGNOSIS — Z7189 Other specified counseling: Secondary | ICD-10-CM | POA: Diagnosis not present

## 2019-12-12 DIAGNOSIS — S31104A Unspecified open wound of abdominal wall, left lower quadrant without penetration into peritoneal cavity, initial encounter: Secondary | ICD-10-CM | POA: Diagnosis not present

## 2019-12-12 DIAGNOSIS — N185 Chronic kidney disease, stage 5: Secondary | ICD-10-CM | POA: Diagnosis not present

## 2019-12-12 DIAGNOSIS — Z8616 Personal history of COVID-19: Secondary | ICD-10-CM | POA: Diagnosis not present

## 2019-12-12 DIAGNOSIS — Z9981 Dependence on supplemental oxygen: Secondary | ICD-10-CM | POA: Diagnosis not present

## 2019-12-12 DIAGNOSIS — D692 Other nonthrombocytopenic purpura: Secondary | ICD-10-CM | POA: Diagnosis not present

## 2019-12-12 DIAGNOSIS — Z86711 Personal history of pulmonary embolism: Secondary | ICD-10-CM | POA: Diagnosis not present

## 2019-12-12 DIAGNOSIS — G8194 Hemiplegia, unspecified affecting left nondominant side: Secondary | ICD-10-CM | POA: Diagnosis not present

## 2019-12-12 DIAGNOSIS — G4733 Obstructive sleep apnea (adult) (pediatric): Secondary | ICD-10-CM | POA: Diagnosis not present

## 2019-12-12 DIAGNOSIS — Z515 Encounter for palliative care: Secondary | ICD-10-CM

## 2019-12-12 DIAGNOSIS — E1122 Type 2 diabetes mellitus with diabetic chronic kidney disease: Secondary | ICD-10-CM | POA: Diagnosis not present

## 2019-12-12 DIAGNOSIS — L98498 Non-pressure chronic ulcer of skin of other sites with other specified severity: Secondary | ICD-10-CM | POA: Diagnosis not present

## 2019-12-12 DIAGNOSIS — Z992 Dependence on renal dialysis: Secondary | ICD-10-CM | POA: Diagnosis not present

## 2019-12-12 LAB — CBC WITH DIFFERENTIAL/PLATELET
Abs Immature Granulocytes: 0.07 10*3/uL (ref 0.00–0.07)
Basophils Absolute: 0.1 10*3/uL (ref 0.0–0.1)
Basophils Relative: 1 %
Eosinophils Absolute: 0.3 10*3/uL (ref 0.0–0.5)
Eosinophils Relative: 3 %
HCT: 24.7 % — ABNORMAL LOW (ref 39.0–52.0)
Hemoglobin: 7.3 g/dL — ABNORMAL LOW (ref 13.0–17.0)
Immature Granulocytes: 1 %
Lymphocytes Relative: 20 %
Lymphs Abs: 2 10*3/uL (ref 0.7–4.0)
MCH: 30.8 pg (ref 26.0–34.0)
MCHC: 29.6 g/dL — ABNORMAL LOW (ref 30.0–36.0)
MCV: 104.2 fL — ABNORMAL HIGH (ref 80.0–100.0)
Monocytes Absolute: 0.9 10*3/uL (ref 0.1–1.0)
Monocytes Relative: 9 %
Neutro Abs: 6.6 10*3/uL (ref 1.7–7.7)
Neutrophils Relative %: 66 %
Platelets: 180 10*3/uL (ref 150–400)
RBC: 2.37 MIL/uL — ABNORMAL LOW (ref 4.22–5.81)
RDW: 15.7 % — ABNORMAL HIGH (ref 11.5–15.5)
WBC: 9.8 10*3/uL (ref 4.0–10.5)
nRBC: 0 % (ref 0.0–0.2)

## 2019-12-13 ENCOUNTER — Encounter: Payer: Self-pay | Admitting: Internal Medicine

## 2019-12-13 NOTE — Progress Notes (Signed)
[name], [name] ([id number]) Visit Report for 12/12/2019 HPI Details Patient Name: Date of Service: [name], [name] [facility] [place] R. 12/12/2019 10:45 A M Medical Record Number: [id number] Patient Account Number: [id number] Date of Birth/Sex: Treating RN: March 29, 1966 (54 y.o. [name] Primary Care Provider: Evelina [name] Other Clinician: Referring Provider: Treating Provider/Extender: [name] in Treatment: 6 History of Present Illness HPI Description: Admission 10/30/2019 This is a 53 year old very disabled man with multiple medical issues. 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 His problems apparently started roughly 4 months ago s he noted expanding painful areas on the left lateral abdominal wall and the left lateral thorax. These were very painful and necrotic and have been expanding. It is really deteriorated for him in October when he had Covid and had to be hospitalized. This caused deterioration in his respiratory function including chronic oxygen at 3 to 4 L, onset of dialysis and weight loss of 60 pounds. Most recently has home health of implying silver alginate to these expanding wounds. He has been on 2 courses of doxycycline in early April and then once last week given to him by his primary doctor who had a telehealth visit. He is here for review of these issues. 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 Past medical history includes type 2 diabetes well controlled with a recent hemoglobin A1c of 6.6, chronic O2 requirements, morbid obesity, Coumadin secondary to history of DVT and PE, stage V chronic renal failure, BPH, seizure disorder although none recently, left AV fistula, he was seen by palliative care on 4/27 noted to be  immobile with widespread arthritic pain high O2 requirements. He is however full code and I did not discuss advanced treatment issues 5/18; the patient has been on sodium thiosulfate at dialysis for 2 weeks. 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 He was kindly seen by Dr. Sidney [name] dermatology at [facility]. She also suggested stopping Coumadin Coumadin immediately and transitioning to a different anticoagulant. Also suggested stopping calcium containing phosphate binders and using alternative binders like Renvela although I am not exactly sure what if the patient is on a calcium containing phosphate binder. I have contacted the patient's primary physician at [facility] to stop the Coumadin. As noted 2 weeks ago I talked with Dr. Moshe [name] at nephrology about thiosulfate. 6/1; the patient is now on Eliquis. Using Renvela as a phosphate binder. He has 2 large areas 1 under the left breast and one on the left abdomen. The area under the left breast actually looks some better today the left side of his abdomen looks about the same. He has been using Medihoney with calcium alginate on top. This is hopeful to allow some debridement of these areas. He also has an area of retiform purpura on his left anterior thigh however this actually looks somewhat better today a little less well-defined not is clearly marked. It is not opened which is fortunate 6/15; the patient arrives with the area under his left breast looking somewhat better. Area on the left abdomen worse more tissue necrosis especially laterally. Surface area is  somewhat larger Electronic Signature(s) Signed: 12/12/2019 5:55:30 PM By: Linton Ham MD Entered By: Linton Ham on 12/12/2019  12:37:20 -------------------------------------------------------------------------------- Physical Exam Details Patient Name: Date of Service: [name], [name] 12/12/2019 10:45 A M Medical Record Number: [id number] Patient Account Number: [id number] Date of Birth/Sex: Treating RN: Nov 01, 1965 (54 y.o. [name] Primary Care Provider: Evelina [name] Other Clinician: Referring Provider: Treating Provider/Extender: [name] in Treatment: 6 Constitutional Patient is hypotensive.. Pulse regular and within target range for patient.Marland Kitchen Respirations regular, non-labored and within target range.. Patient is febrile today.. Does not appear medically unwell but he appears uncomfortable. Says this is because of pain in the abdominal wound. Integumentary (Hair, Skin) The area on the left anterior thigh close to the groin area which was reniform purpura has faded. Notes Wound exam The area on the left abdominal wall larger more necrosis especially from roughly 12-3 o'clock. I could not accurately measure this but I do not think there is much probing depth. It simply caused too much discomfort for the patient On the area below the left breast there is 1 small area of this that is depth with baseline necrosis the rest of this looks somewhat better superficial The area on the left medial thigh did not open it is fading. This is indeed fortunate Engineer, maintenance) Signed: 12/12/2019 5:55:30 PM By: Linton Ham MD Entered By: Linton Ham on 12/12/2019 12:41:18 -------------------------------------------------------------------------------- Physician Orders Details Patient Name: Date of Service: [name], [name] 12/12/2019 10:45 A M Medical Record Number: [id number] Patient Account Number: [id number] Date of Birth/Sex: Treating RN: [birth year] (54 y.o. 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 Dressing Change Frequency Wound #1 Left,Inferior Flank Change Dressing every other day. Wound #2 Left,Lateral Abdomen - Lower Quadrant Change Dressing every other day. Wound Cleansing Wound #1 Left,Inferior Flank Clean wound with Wound Cleanser - or normal saline Wound #2 Left,Lateral Abdomen - Lower Quadrant Clean wound with Wound Cleanser - or normal saline Primary Wound Dressing Wound #1 Left,Inferior Flank Medihoney Alginate Wound #2 Left,Lateral Abdomen - Lower Quadrant Medihoney Alginate Secondary Dressing Wound #1 Left,Inferior Flank Dry Gauze ABD pad - secure with tape Wound #2 Left,Lateral Abdomen - Lower Quadrant Dry Gauze ABD pad - secure with tape Off-Loading A fluidized (Group 3) mattress - Home Health please order [place] skilled nursing for wound care. - [facility] Electronic Signature(s) Signed: 12/12/2019 5:55:30 PM By: Linton Ham MD Signed: 12/13/2019 9:54:21 AM By: Patrick Coria RN Entered By: [name] on 12/12/2019 11:47:56 -------------------------------------------------------------------------------- Problem List Details Patient Name: Date of Service: SALMAN, WELLEN [facility] [place] R. 12/12/2019 10:45 A M Medical Record Number: [id number] Patient Account Number: [id number] Date of Birth/Sex: Treating RN: 13-Dec-1965 (54 y.o. [name] Primary Care Provider: Evelina [name] Other Clinician: Referring Provider: Treating Provider/Extender: [name] in Treatment: 6 Active Problems ICD-10 Encounter Code Description Active Date MDM Diagnosis S31.104D Unspecified open wound of abdominal wall, left lower quadrant without 10/30/2019 No Yes penetration into  peritoneal cavity, subsequent encounter L98.498 Non-pressure chronic ulcer of skin of other sites with other specified severity 10/30/2019 No Yes S21.90XD Unspecified open wound of unspecified part of thorax, subsequent encounter 10/30/2019 No Yes E83.59 Other disorders of calcium metabolism 10/30/2019 No Yes Inactive Problems Resolved Problems Electronic Signature(s) Signed: 12/12/2019 5:55:30 PM By: Linton Ham MD Entered By: Linton Ham on 12/12/2019 12:36:38 -------------------------------------------------------------------------------- Progress Note Details Patient Name: Date of Service: [place] R. 12/12/2019 10:45 A M Medical Record Number: [id number] Patient Account Number: [id number] Date of Birth/Sex: Treating RN: September 20, 1965 (54 y.o. [name] Primary Care Provider: Evelina [name] Other Clinician: Referring Provider: Treating Provider/Extender: Geraldine Contras Weeks in Treatment: 6 Subjective History of Present Illness (HPI) Admission 10/30/2019 This is a 54 year old very disabled man with multiple medical issues. 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 His problems apparently started roughly 4 months ago s he noted expanding painful areas on the left lateral abdominal wall and the left lateral thorax. These were very painful and necrotic and have been expanding. It is really deteriorated for him in October when he had Covid and had to be hospitalized. This caused deterioration in  his respiratory function including chronic oxygen at 3 to 4 L, onset of dialysis and weight loss of 60 pounds. Most recently has home health of implying silver alginate to these expanding wounds. He has been on 2 courses of doxycycline in early April and then once last week given to him by his primary doctor who had a telehealth visit. He is here for review of these issues. 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 Past medical history includes type 2 diabetes well controlled with a recent hemoglobin A1c of 6.6, chronic O2 requirements, morbid obesity, Coumadin secondary to history of DVT and PE, stage V chronic renal failure, BPH, seizure disorder although none recently, left AV fistula, he was seen by palliative care on 4/27 noted to be immobile with widespread arthritic pain high O2 requirements. He is however full code and I did not discuss advanced treatment issues 5/18; the patient has been on sodium thiosulfate at dialysis for 2 weeks. 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 He was kindly seen by Dr. Sidney [name] dermatology at [facility]. She also suggested stopping Coumadin Coumadin immediately and transitioning to a different anticoagulant. Also suggested stopping calcium containing phosphate binders and using alternative binders like Renvela although I am not exactly sure what if the patient is on a calcium containing phosphate binder. I have contacted the patient's primary physician at [facility] to stop the Coumadin. As noted 2 weeks ago I talked with Dr. Moshe [name] at nephrology about thiosulfate. 6/1; the patient is now on Eliquis. Using Renvela as a phosphate binder. He has 2 large areas 1 under the left  breast and one on the left abdomen. The area under the left breast actually looks some better today the left side of his abdomen looks about the same. He has been using Medihoney with calcium alginate on top. This is hopeful to allow some debridement of these areas. He also has an  area of retiform purpura on his left anterior thigh however this actually looks somewhat better today a little less well-defined not is clearly marked. It is not opened which is fortunate 6/15; the patient arrives with the area under his left breast looking somewhat better. Area on the left abdomen worse more tissue necrosis especially laterally. Surface area is somewhat larger Objective Constitutional Patient is hypotensive.. Pulse regular and within target range for patient.Marland Kitchen Respirations regular, non-labored and within target range.. Patient is febrile today.. Does not appear medically unwell but he appears uncomfortable. Says this is because of pain in the abdominal wound. Vitals Time Taken: 11:23 AM, Height: 75 in, Source: Stated, Weight: 373 lbs, Source: Stated, BMI: 46.6, Temperature: 99.6 F, Pulse: 79 bpm, Respiratory Rate: 20 breaths/min, Blood Pressure: 98/40 mmHg, Capillary Blood Glucose: 150 mg/dl. General Notes: glusode per pt report this am General Notes: Wound exam ooThe area on the left abdominal wall larger more necrosis especially from roughly 12-3 o'clock. I could not accurately measure this but I do not think there is much probing depth. It simply caused too much discomfort for the patient ooOn the area below the left breast there is 1 small area of this that is depth with baseline necrosis the rest of this looks somewhat better superficial ooThe area on the left medial thigh did not open it is fading. This is indeed fortunate Integumentary (Hair, Skin) The area on the left anterior thigh close to the groin area which was reniform purpura has faded. Wound #1 status is Open. Original cause of  wound was Gradually Appeared. The wound is located on the Left,Inferior Flank. The wound measures 3.5cm length x 11.6cm width x 0.7cm depth; 31.887cm^2 area and 22.321cm^3 volume. There is Fat Layer (Subcutaneous Tissue) Exposed exposed. There is no tunneling or undermining noted. There is a medium amount of serosanguineous drainage noted. The wound margin is flat and intact. There is small (1-33%) pink granulation within the wound bed. There is a large (67-100%) amount of necrotic tissue within the wound bed including Eschar and Adherent Slough. Wound #2 status is Open. Original cause of wound was Gradually Appeared. The wound is located on the Left,Lateral Abdomen - Lower Quadrant. The wound measures 8.8cm length x 13.1cm width x 1.4cm depth; 90.541cm^2 area and 126.757cm^3 volume. There is Fat Layer (Subcutaneous Tissue) Exposed exposed. There is no tunneling or undermining noted. There is a large amount of serosanguineous drainage noted. The wound margin is flat and intact. There is no granulation within the wound bed. There is a large (67-100%) amount of necrotic tissue within the wound bed including Eschar and Adherent Slough. Wound #3 status is Open. Original cause of wound was Shear/Friction. The wound is located on the Left,Superior Flank. The wound measures 0cm length x 0cm width x 0cm depth; 0cm^2 area and 0cm^3 volume. There is no tunneling or undermining noted. There is a none present amount of drainage noted. The wound margin is flat and intact. There is no granulation within the wound bed. There is no necrotic tissue within the wound bed. Assessment Active Problems ICD-10 Unspecified open wound of abdominal wall, left lower quadrant without penetration into peritoneal cavity, subsequent encounter Non-pressure chronic ulcer of skin of other sites with other specified severity Unspecified open wound of unspecified part of thorax, subsequent encounter Other disorders of calcium  metabolism Plan Follow-up Appointments: Return Appointment in 2 weeks. Dressing Change Frequency: Wound #1 Left,Inferior Flank: Change Dressing every other day. Wound #2 Left,Lateral Abdomen - Lower Quadrant:  Change Dressing every other day. Wound Cleansing: Wound #1 Left,Inferior Flank: Clean wound with Wound Cleanser - or normal saline Wound #2 Left,Lateral Abdomen - Lower Quadrant: Clean wound with Wound Cleanser - or normal saline Primary Wound Dressing: Wound #1 Left,Inferior Flank: Medihoney Alginate Wound #2 Left,Lateral Abdomen - Lower Quadrant: Medihoney Alginate Secondary Dressing: Wound #1 Left,Inferior Flank: Dry Gauze ABD pad - secure with tape Wound #2 Left,Lateral Abdomen - Lower Quadrant: Dry Gauze ABD pad - secure with tape Off-Loading: Air fluidized (Group 3) mattress - Home Health please order Home Health: [place] skilled nursing for wound care. - [facility] 1. I continue with the Medihoney and calcium alginate to his wound areas 2. It came out that he was already seeing palliative care at home and has an appointment today to go to full hospice. He is already talking about stopping dialysis. 3. In view of to above I told them that we would remain available to them if they want to come in and see [place] for any reason or have hospice nurses call us. I did not make him a follow-up appointment. Electronic Signature(s) Signed: 12/12/2019 5:55:30 PM By: Linton Ham MD Entered By: Linton Ham on 12/12/2019 12:42:43 -------------------------------------------------------------------------------- SuperBill Details Patient Name: Date of Service: [place], [place] R. 12/12/2019 Medical Record Number: [id number] Patient Account Number: [id number] Date of Birth/Sex: Treating RN: [birth year] (54 y.o. [name]) [name] Primary Care Provider: Evelina [name] Other Clinician: Referring Provider: Treating Provider/Extender: [name] in Treatment: 6 Diagnosis Coding ICD-10 Codes Code Description S31.104D Unspecified open wound of abdominal wall, left lower quadrant without penetration into peritoneal cavity, subsequent encounter L98.498 Non-pressure chronic ulcer of skin of other sites with other specified severity S21.90XD Unspecified open wound of unspecified part of thorax, subsequent encounter E83.59 Other disorders of calcium metabolism Facility Procedures Physician Procedures : CPT4 Code Description Modifier [id number] - WC PHYS LEVEL 3 - EST PT ICD-10 Diagnosis Description S31.104D Unspecified open wound of abdominal wall, left lower quadrant without penetration into peritoneal cavit subsequent encounter L98.498  Non-pressure chronic ulcer of skin of other sites with other specified severity E83.59 Other disorders of calcium metabolism Quantity: 1 y, Electronic Signature(s) Signed: 12/12/2019 5:55:30 PM By: Linton Ham MD Entered By: Linton Ham on 12/12/2019 12:42:59

## 2019-12-13 NOTE — Progress Notes (Signed)
WYMAN, MESCHKE ([id number]) Visit Report for 12/12/2019 Arrival Information Details Patient Name: Date of Service: [name], [name] [facility] [place] R. 12/12/2019 10:45 A M Medical Record Number: [id number] Patient Account Number: [id number] Date of Birth/Sex: Treating RN: 1966/04/25 (54 y.o. [name] Primary Care Deneice Wack: Evelina Dun Other Clinician: Referring Kyndel Egger: Treating Clemie General/Extender: Osker Mason in Treatment: 6 Visit Information History Since Last Visit Added or deleted any medications: Yes Patient Arrived: Wheel Chair Any new allergies or adverse reactions: No Arrival Time: 11:19 Had a fall or experienced change in No Accompanied By: spouse activities of daily living that may affect Transfer Assistance: None risk of falls: Patient Identification Verified: Yes Signs or symptoms of abuse/neglect since last visito No Secondary Verification Process Completed: Yes Hospitalized since last visit: No Patient Requires Transmission-Based Precautions: No Implantable device outside of the clinic excluding No Patient Has Alerts: Yes cellular tissue based products placed in the center Patient Alerts: Patient on Blood Thinner since last visit: Has Dressing in Place as Prescribed: Yes Pain Present Now: Yes Electronic Signature(s) Signed: 12/12/2019 5:45:30 PM By: Baruch Gouty RN, BSN Entered By: Baruch Gouty on 12/12/2019 11:20:35 -------------------------------------------------------------------------------- Clinic Level of Care Assessment Details Patient Name: Date of Service: [place], [place] R. 12/12/2019 10:45 A M Medical Record Number: [id number] Patient Account Number: [id number] Date of Birth/Sex: Treating RN: [birth year] (54 y.o. [name]) Carlene Coria Primary Care Nieshia Larmon: Evelina Dun Other Clinician: Referring Skyleen Bentley: Treating Oakes Mccready/Extender: Osker Mason in Treatment: 6 Clinic Level of Care Assessment  Items TOOL 4 Quantity Score X- 1 0 Use when only an EandM is performed on FOLLOW-UP visit ASSESSMENTS - Nursing Assessment / Reassessment X- 1 10 Reassessment of Co-morbidities (includes updates in patient status) X- 1 5 Reassessment of Adherence to Treatment Plan ASSESSMENTS - Wound and Skin A ssessment / Reassessment []  - 0 Simple Wound Assessment / Reassessment - one wound X- 3 5 Complex Wound Assessment / Reassessment - multiple wounds []  - 0 Dermatologic / Skin Assessment (not related to wound area) ASSESSMENTS - Focused Assessment []  - 0 Circumferential Edema Measurements - multi extremities []  - 0 Nutritional Assessment / Counseling / Intervention []  - 0 Lower Extremity Assessment (monofilament, tuning fork, pulses) []  - 0 Peripheral Arterial Disease Assessment (using hand held doppler) ASSESSMENTS - Ostomy and/or Continence Assessment and Care []  - 0 Incontinence Assessment and Management []  - 0 Ostomy Care Assessment and Management (repouching, etc.) PROCESS - Coordination of Care X - Simple Patient / Family Education for ongoing care 1 15 []  - 0 Complex (extensive) Patient / Family Education for ongoing care X- 1 10 Staff obtains Programmer, systems, Records, T Results / Process Orders est []  - 0 Staff telephones HHA, Nursing Homes / Clarify orders / etc []  - 0 Routine Transfer to another Facility (non-emergent condition) []  - 0 Routine Hospital Admission (non-emergent condition) []  - 0 New Admissions / Biomedical engineer / Ordering NPWT Apligraf, etc. , []  - 0 Emergency Hospital Admission (emergent condition) X- 1 10 Simple Discharge Coordination []  - 0 Complex (extensive) Discharge Coordination PROCESS - Special Needs []  - 0 Pediatric / Minor Patient Management []  - 0 Isolation Patient Management []  - 0 Hearing / Language / Visual special needs []  - 0 Assessment of Community assistance (transportation, D/C planning, etc.) []  - 0 Additional  assistance / Altered mentation []  - 0 Support Surface(s) Assessment (bed, cushion, seat, etc.) INTERVENTIONS - Wound Cleansing / Measurement []  - 0 Simple Wound Cleansing - one wound  X- 3 5 Complex Wound Cleansing - multiple wounds X- 1 5 Wound Imaging (photographs - any number of wounds) []  - 0 Wound Tracing (instead of photographs) []  - 0 Simple Wound Measurement - one wound X- 3 5 Complex Wound Measurement - multiple wounds INTERVENTIONS - Wound Dressings []  - 0 Small Wound Dressing one or multiple wounds X- 3 15 Medium Wound Dressing one or multiple wounds []  - 0 Large Wound Dressing one or multiple wounds X- 1 5 Application of Medications - topical []  - 0 Application of Medications - injection INTERVENTIONS - Miscellaneous []  - 0 External ear exam []  - 0 Specimen Collection (cultures, biopsies, blood, body fluids, etc.) []  - 0 Specimen(s) / Culture(s) sent or taken to Lab for analysis []  - 0 Patient Transfer (multiple staff / Civil Service fast streamer / Similar devices) []  - 0 Simple Staple / Suture removal (25 or less) []  - 0 Complex Staple / Suture removal (26 or more) []  - 0 Hypo / Hyperglycemic Management (close monitor of Blood Glucose) []  - 0 Ankle / Brachial Index (ABI) - do not check if billed separately X- 1 5 Vital Signs Has the patient been seen at the hospital within the last three years: Yes Total Score: 155 Level Of Care: New/Established - Level 4 Electronic Signature(s) Signed: 12/13/2019 9:54:21 AM By: Carlene Coria RN Entered By: Carlene Coria on 12/12/2019 11:50:16 -------------------------------------------------------------------------------- Encounter Discharge Information Details Patient Name: Date of Service: [name] [facility] [place] R. 12/12/2019 10:45 A M Medical Record Number: [id number] Patient Account Number: [id number] Date of Birth/Sex: Treating RN: September 24, 1965 (54 y.o. [name] Primary Care Lavona Norsworthy: Evelina Dun Other Clinician: Referring  Alanda Colton: Treating Muskaan Smet/Extender: Osker Mason in Treatment: 6 Encounter Discharge Information Items Discharge Condition: Stable Ambulatory Status: Wheelchair Discharge Destination: Home Transportation: Private Auto Accompanied By: wife Schedule Follow-up Appointment: Yes Clinical Summary of Care: Electronic Signature(s) Signed: 12/12/2019 5:41:07 PM By: Deon Pilling Entered By: Deon Pilling on 12/12/2019 12:03:41 -------------------------------------------------------------------------------- Lower Extremity Assessment Details Patient Name: Date of Service: TEVYN, CODD [place] R. 12/12/2019 10:45 A M Medical Record Number: [id number] Patient Account Number: [id number] Date of Birth/Sex: Treating RN: 06-12-1966 (54 y.o. [name] Primary Care Konnie Noffsinger: Evelina Dun Other Clinician: Referring Vikram Tillett: Treating Cache Decoursey/Extender: Geraldine Contras Weeks in Treatment: 6 Electronic Signature(s) Signed: 12/12/2019 5:45:30 PM By: Baruch Gouty RN, BSN Entered By: Baruch Gouty on 12/12/2019 11:25:59 -------------------------------------------------------------------------------- Multi Wound Chart Details Patient Name: Date of Service: ERROL, ALA [facility] [place] R. 12/12/2019 10:45 A M Medical Record Number: [id number] Patient Account Number: [id number] Date of Birth/Sex: Treating RN: [birth year] (54 y.o. [name]) Carlene Coria Primary Care Chan Rosasco: Evelina Dun Other Clinician: Referring Abigayl Hor: Treating Tamar Lipscomb/Extender: Geraldine Contras Weeks in Treatment: 6 Vital Signs Height(in): 75 Capillary Blood Glucose(mg/dl): 150 Weight(lbs): 373 Pulse(bpm): 24 Body Mass Index(BMI): 69 Blood Pressure(mmHg): 98/40 Temperature(F): 99.6 Respiratory Rate(breaths/min): 20 Photos: [1:No Photos Left, Inferior Flank] [2:No Photos Left, Lateral Abdomen - Lower] [3:No Photos Left, Superior Flank] Wound Location: [1:Gradually  Appeared] [2:Quadrant Gradually Appeared] [3:Shear/Friction] Wounding Event: [1:Cellulitis] [2:Cellulitis] [3:Abrasion] Primary Etiology: [1:Anemia, Type II Diabetes,] [2:Anemia, Type II Diabetes,] [3:Anemia, Type II Diabetes,] Comorbid History: [1:Osteoarthritis 08/14/2019] [2:Osteoarthritis 08/14/2019] [3:Osteoarthritis 11/28/2019] Date Acquired: [1:6] [2:6] [3:2] Weeks of Treatment: [1:Open] [2:Open] [3:Open] Wound Status: [1:3.5x11.6x0.7] [2:8.8x13.1x1.4] [3:0x0x0] Measurements L x W x D (cm) [1:31.887] [2:90.541] [3:0] A (cm) : rea [1:22.321] [2:126.757] [3:0] Volume (cm) : [1:-76.50%] [2:-113.50%] [3:100.00%] % Reduction in Area: [1:-1135.90%] [2:-2888.80%] [3:100.00%] % Reduction in Volume: [1:Full Thickness  Without Exposed] [2:Full Thickness Without Exposed] [3:Full Thickness Without Exposed] Classification: [1:Support Structures Medium] [2:Support Structures Large] [3:Support Structures None Present] Exudate A mount: [1:Serosanguineous] [2:Serosanguineous] [3:N/A] Exudate Type: [1:red, brown] [2:red, brown] [3:N/A] Exudate Color: [1:Flat and Intact] [2:Flat and Intact] [3:Flat and Intact] Wound Margin: [1:Small (1-33%)] [2:None Present (0%)] [3:None Present (0%)] Granulation Amount: [1:Pink] [2:N/A] [3:N/A] Granulation Quality: [1:Large (67-100%)] [2:Large (67-100%)] [3:None Present (0%)] Necrotic Amount: [1:Eschar, Adherent Slough] [2:Eschar, Adherent Slough] [3:N/A] Necrotic Tissue: [1:Fat Layer (Subcutaneous Tissue)] [2:Fat Layer (Subcutaneous Tissue)] [3:Fascia: No] Exposed Structures: [1:Exposed: Yes Fascia: No Tendon: No Muscle: No Joint: No Bone: No Small (1-33%)] [2:Exposed: Yes Fascia: No Tendon: No Muscle: No Joint: No Bone: No None] [3:Fat Layer (Subcutaneous Tissue) Exposed: No Tendon: No Muscle: No Joint: No Bone: No Large  (67-100%)] Treatment Notes Wound #1 (Left, Inferior Flank) 1. Cleanse With Wound Cleanser 3. Primary Dressing Applied Calcium Alginate Other  primary dressing (specifiy in notes) 4. Secondary Dressing ABD Pad 5. Secured With Medipore tape Notes primary dressing medihoney alginate. Wound #2 (Left, Lateral Abdomen - Lower Quadrant) 1. Cleanse With Wound Cleanser 3. Primary Dressing Applied Calcium Alginate Other primary dressing (specifiy in notes) 4. Secondary Dressing ABD Pad 5. Secured With Medipore tape Notes primary dressing medihoney alginate. Wound #3 (Left, Superior Flank) 1. Cleanse With Wound Cleanser 3. Primary Dressing Applied Calcium Alginate Other primary dressing (specifiy in notes) 4. Secondary Dressing ABD Pad 5. Secured With Medipore tape Notes primary dressing medihoney alginate. Electronic Signature(s) Signed: 12/12/2019 5:55:30 PM By: Linton Ham MD Signed: 12/13/2019 9:54:21 AM By: Carlene Coria RN Entered By: Linton Ham on 12/12/2019 12:36:46 -------------------------------------------------------------------------------- Multi-Disciplinary Care Plan Details Patient Name: Date of Service: [place] R. 12/12/2019 10:45 A M Medical Record Number: [id number] Patient Account Number: [id number] Date of Birth/Sex: Treating RN: [birth year] (54 y.o. [name] Primary Care Alvia Jablonski: Evelina Dun Other Clinician: Referring Marwah Disbro: Treating Shanitra Phillippi/Extender: Osker Mason in Treatment: 6 Active Inactive Wound/Skin Impairment Nursing Diagnoses: Impaired tissue integrity Knowledge deficit related to ulceration/compromised skin integrity Goals: Patient/caregiver will verbalize understanding of skin care regimen Date Initiated: 10/30/2019 Target Resolution Date: 2020/01/14 Goal Status: Active Interventions: Assess patient/caregiver ability to obtain necessary supplies Assess patient/caregiver ability to perform ulcer/skin care regimen upon admission and as needed Assess ulceration(s) every visit Provide education on ulcer and skin  care Notes: Electronic Signature(s) Signed: 12/13/2019 9:54:21 AM By: Carlene Coria RN Entered By: Carlene Coria on 12/12/2019 12:12:04 -------------------------------------------------------------------------------- Pain Assessment Details Patient Name: Date of Service[place]. 12/12/2019 10:45 A M Medical Record Number: [id number] Patient Account Number: [id number] Date of Birth/Sex: Treating RN: [birth year] (54 y.o. [name] Primary Care Pocahontas Cohenour: Evelina Dun Other Clinician: Referring Tulsi Crossett: Treating Nykole Matos/Extender: Osker Mason in Treatment: 6 Active Problems Location of Pain Severity and Description of Pain Patient Has Paino Yes Site Locations Pain Location: Pain in Ulcers With Dressing Change: Yes Duration of the Pain. Constant / Intermittento Constant Rate the pain. Current Pain Level: 8 Character of Pain Describe the Pain: Aching Pain Management and Medication Current Pain Management: Medication: Yes Is the Current Pain Management Adequate: Adequate How does your wound impact your activities of daily livingo Sleep: Yes Bathing: No Appetite: No Relationship With Others: No Bladder Continence: No Emotions: Yes Bowel Continence: No Hobbies: No Toileting: No Dressing: No Electronic Signature(s) Signed: 12/12/2019 5:45:30 PM By: Baruch Gouty RN, BSN Entered By: Baruch Gouty on 12/12/2019 11:25:35 -------------------------------------------------------------------------------- Patient/Caregiver Education Details Patient Name: Date of  Service: Claudette Laws 6/15/2021andnbsp10:45 A M Medical Record Number: [id number] Patient Account Number: [id number] Date of Birth/Gender: Treating RN: 1965-08-17 (54 y.o. [name] Primary Care Physician: Evelina Dun Other Clinician: Referring Physician: Treating Physician/Extender: Osker Mason in Treatment: 6 Education  Assessment Education Provided To: Patient Education Topics Provided Wound/Skin Impairment: Methods: Explain/Verbal Responses: State content correctly Electronic Signature(s) Signed: 12/13/2019 9:54:21 AM By: Carlene Coria RN Entered By: Carlene Coria on 12/12/2019 12:12:18 -------------------------------------------------------------------------------- Wound Assessment Details Patient Name: Date of Service: TOR, TSUDA [place] R. 12/12/2019 10:45 A M Medical Record Number: [id number] Patient Account Number: [id number] Date of Birth/Sex: Treating RN: [birth year] (54 y.o. [name] Primary Care Lourene Hoston: Evelina Dun Other Clinician: Referring Daren Doswell: Treating Aryia Delira/Extender: Geraldine Contras Weeks in Treatment: 6 Wound Status Wound Number: 1 Primary Etiology: Cellulitis Wound Location: Left, Inferior Flank Wound Status: Open Wounding Event: Gradually Appeared Comorbid History: Anemia, Type II Diabetes, Osteoarthritis Date Acquired: 08/14/2019 Weeks Of Treatment: 6 Clustered Wound: No Photos Photo Uploaded By: Mikeal Hawthorne on 12/12/2019 16:35:46 Wound Measurements Length: (cm) 3.5 Width: (cm) 11.6 Depth: (cm) 0.7 Area: (cm) 31.887 Volume: (cm) 22.321 % Reduction in Area: -76.5% % Reduction in Volume: -1135.9% Epithelialization: Small (1-33%) Tunneling: No Undermining: No Wound Description Classification: Full Thickness Without Exposed Support Structures Wound Margin: Flat and Intact Exudate Amount: Medium Exudate Type: Serosanguineous Exudate Color: red, brown Foul Odor After Cleansing: No Slough/Fibrino Yes Wound Bed Granulation Amount: Small (1-33%) Exposed Structure Granulation Quality: Pink Fascia Exposed: No Necrotic Amount: Large (67-100%) Fat Layer (Subcutaneous Tissue) Exposed: Yes Necrotic Quality: Eschar, Adherent Slough Tendon Exposed: No Muscle Exposed: No Joint Exposed: No Bone Exposed: No Treatment Notes Wound #1  (Left, Inferior Flank) 1. Cleanse With Wound Cleanser 3. Primary Dressing Applied Calcium Alginate Other primary dressing (specifiy in notes) 4. Secondary Dressing ABD Pad 5. Secured With Medipore tape Notes primary dressing medihoney alginate. Electronic Signature(s) Signed: 12/12/2019 5:45:30 PM By: Baruch Gouty RN, BSN Entered By: Baruch Gouty on 12/12/2019 11:39:44 -------------------------------------------------------------------------------- Wound Assessment Details Patient Name: Date of Service: KAZUMI, LACHNEY [facility] [place] R. 12/12/2019 10:45 A M Medical Record Number: [id number] Patient Account Number: [id number] Date of Birth/Sex: Treating RN: 15-Mar-1966 (54 y.o. [name] Primary Care Davide Risdon: Evelina Dun Other Clinician: Referring Anina Schnake: Treating Sheelah Ritacco/Extender: Geraldine Contras Weeks in Treatment: 6 Wound Status Wound Number: 2 Primary Etiology: Cellulitis Wound Location: Left, Lateral Abdomen - Lower Quadrant Wound Status: Open Wounding Event: Gradually Appeared Comorbid History: Anemia, Type II Diabetes, Osteoarthritis Date Acquired: 08/14/2019 Weeks Of Treatment: 6 Clustered Wound: No Photos Photo Uploaded By: Mikeal Hawthorne on 12/12/2019 16:35:48 Wound Measurements Length: (cm) 8.8 Width: (cm) 13.1 Depth: (cm) 1.4 Area: (cm) 90.541 Volume: (cm) 126.757 Wound Description Classification: Full Thickness Without Exposed Support Struct Wound Margin: Flat and Intact Exudate Amount: Large Exudate Type: Serosanguineous Exudate Color: red, brown Foul Odor After Cleansing: Slough/Fibrino % Reduction in Area: -113.5% % Reduction in Volume: -2888.8% Epithelialization: None Tunneling: No Undermining: No ures No Yes Wound Bed Granulation Amount: None Present (0%) Exposed Structure Necrotic Amount: Large (67-100%) Fascia Exposed: No Necrotic Quality: Eschar, Adherent Slough Fat Layer (Subcutaneous Tissue) Exposed:  Yes Tendon Exposed: No Muscle Exposed: No Joint Exposed: No Bone Exposed: No Treatment Notes Wound #2 (Left, Lateral Abdomen - Lower Quadrant) 1. Cleanse With Wound Cleanser 3. Primary Dressing Applied Calcium Alginate Other primary dressing (specifiy in notes) 4. Secondary Dressing ABD Pad 5. Secured With Medipore tape Notes primary dressing medihoney alginate.  Electronic Signature(s) Signed: 12/12/2019 5:45:30 PM By: Baruch Gouty RN, BSN Entered By: Baruch Gouty on 12/12/2019 11:40:07 -------------------------------------------------------------------------------- Wound Assessment Details Patient Name: Date of Service: DEANTE, BLOUGH [facility] [place] R. 12/12/2019 10:45 A M Medical Record Number: [id number] Patient Account Number: [id number] Date of Birth/Sex: Treating RN: 01/29/1966 (54 y.o. [name] Primary Care Nahun Kronberg: Evelina Dun Other Clinician: Referring Boni Maclellan: Treating Araina Butrick/Extender: Geraldine Contras Weeks in Treatment: 6 Wound Status Wound Number: 3 Primary Etiology: Abrasion Wound Location: Left, Superior Flank Wound Status: Open Wounding Event: Shear/Friction Comorbid History: Anemia, Type II Diabetes, Osteoarthritis Date Acquired: 11/28/2019 Weeks Of Treatment: 2 Clustered Wound: No Photos Photo Uploaded By: Mikeal Hawthorne on 12/12/2019 16:45:31 Wound Measurements Length: (cm) Width: (cm) Depth: (cm) Area: (cm) Volume: (cm) 0 % Reduction in Area: 100% 0 % Reduction in Volume: 100% 0 Epithelialization: Large (67-100%) 0 Tunneling: No 0 Undermining: No Wound Description Classification: Full Thickness Without Exposed Support Structures Wound Margin: Flat and Intact Exudate Amount: None Present Foul Odor After Cleansing: No Slough/Fibrino No Wound Bed Granulation Amount: None Present (0%) Exposed Structure Necrotic Amount: None Present (0%) Fascia Exposed: No Fat Layer (Subcutaneous Tissue) Exposed: No Tendon  Exposed: No Muscle Exposed: No Joint Exposed: No Bone Exposed: No Treatment Notes Wound #3 (Left, Superior Flank) 1. Cleanse With Wound Cleanser 3. Primary Dressing Applied Calcium Alginate Other primary dressing (specifiy in notes) 4. Secondary Dressing ABD Pad 5. Secured With Medipore tape Notes primary dressing medihoney alginate. Electronic Signature(s) Signed: 12/12/2019 5:45:30 PM By: Baruch Gouty RN, BSN Entered By: Baruch Gouty on 12/12/2019 11:40:21 -------------------------------------------------------------------------------- Vitals Details Patient Name: Date of Service: DEJOHN, IBARRA [facility] [place] R. 12/12/2019 10:45 A M Medical Record Number: [id number] Patient Account Number: [id number] Date of Birth/Sex: Treating RN: 12/13/1965 (54 y.o. [name] Primary Care Donaven Criswell: Evelina Dun Other Clinician: Referring Juni Glaab: Treating Stephenson Cichy/Extender: Osker Mason in Treatment: 6 Vital Signs Time Taken: 11:23 Temperature (F): 99.6 Height (in): 75 Pulse (bpm): 79 Source: Stated Respiratory Rate (breaths/min): 20 Weight (lbs): 373 Blood Pressure (mmHg): 98/40 Source: Stated Capillary Blood Glucose (mg/dl): 150 Body Mass Index (BMI): 46.6 Reference Range: 80 - 120 mg / dl Notes glusode per pt report this am Electronic Signature(s) Signed: 12/12/2019 5:45:30 PM By: Baruch Gouty RN, BSN Entered By: Baruch Gouty on 12/12/2019 11:24:28

## 2019-12-13 NOTE — Progress Notes (Signed)
June 15th, 2021 [facility] Palliative Care Consult Note Telephone: [phone]  Fax: [phone]   PATIENT NAME: [name] DOB: 04-Sep-1965 MRN: [id number] [place]  [phone]   PRIMARY CARE PROVIDER:   Sharion Balloon, FNP   REFERRING PROVIDER:  Sharion Balloon, FNP [facility] [address], [place],  [place] [address] [phone]   Dialysis at [facility] in [place]   [name] (Urology)   [name], [facility], [place], [place] [address]. [place], [place] / [place] Management, [phone]   [facility] for wound care (started late April 2021)   [name]  (Dermatology [facility])  [name] (Nephrology)   RESPONSIBLE PARTY: (spouse) [name] (hospital x-ray tech) [phone] [email or web address].    ASSESSMENT / RECOMMENDATIONS:   1. Advance Care Planning: A. Directives: DNR (kept on wall over patient's bed).  We discussed and completed the MOST form. Details: Scope of Medical Services limited to Comfort Measures. No to Antibiotics and IVFs. No to Tube Feedings.  B. Goals of Care:  For adequacy of pain relief from abdominal wounds. Patent and spouse realize the seriousness and poor prognosis of calciphylaxis. Patient is adamant that he would not ever go to the hospital. He wishes for comfort level of care only. He is hoping he can continue with [name] for adequate pain management. He would consider discontinuation of dialysis with initiation of Hospice services, if necessary for pain management, if [place] [name] is unable to answer this need. We discussed alternatives of having his PCP and I manage his pain meds. I'll double check with Administrator, Civil Service to check if the  Certified Terminal Illness diagnosis of Calciphylaxis, would still allow patient to continue dialysis under hospice services. Not sure if this would be an option.      2. Symptom Management: A. Pain from his abdominal wounds (2/2 calciphylaxtics) supersede his previous discomfort from arthritic joint and lower back pain, or from his buttock and sacral pressure points. Pain is markedly exacerbated with movement /  pressure on that left abdominal site.  He continues Fentanyl patch 63mg q 72 hrs, and 7.5-325mg hydrocodone-acetaminophen (total of 6/day in divided doses) for breakthrough pain. Adequate pain management on this regimen.  [name] I'm not sure if Ms, Patrick Kailhas the latest update on patient's current situation (wounds and increased pain 2/2 calciphylaxis). I'll call the office for the fax number and ask her to watch for forwarded notes from Patrick. RDellia Nimsand myself.   B. Wounds: Patient with 2 large ulcerations on his left abdomen, one with eschar and significant necrotic adherent tissue. Proximal left thigh with large indurated plaque with overlying erythema and purpura.  Consult (11/07/2019) by [name]([facility] who confirmed diagnosis of calciphylaxis. She discussed with patient the aggressive and serious nature of this disease. She recommended:  -thiosulfate infusions post dialysis (done),  -daily Trental,  -discontinuation of Coumadin and substitution with another anticoagulant (done; changed to Eliquis),  -topical lidocaine mixed with Santyl for eschar, and topical lidocaine mixed with Silvadene for other lesions, -discontinuation of calcium containing phosphate binders, using instead alternative binders like Renvela (done)  -pain control: consider adding gabapentin,  -avoid manual debridement (use enzymatic debridement  as above),  -nutrition consult for high protein diet.   [name]  (wound [name]) following: Wound management with Medihoney, topped with calcium alginate to allow some debridement of these areas. [facility] managing dressing changes.  C. Epistaxis: Developed nose bleeds over the last week, since changed from coumadin to Eliquis. Two days earlier a particularly severe episode profuse bleed. PCP decreased Eliquis to 2.53m bid (from 510mbid). Hgb checked; stable at 7.3.            3. Cognitive / Functional status: (from previous note; reviewed and updated) Patient is A & O x 3. Has an UE resting tremor which waxes and wanes in intensity. He takes Keppra for h/o seizures; none for years. H/O mood disorder well managed on current regimen. He is bed bound; electric bed. Hoyer lift to transfer to wheelchair. Dependent for assist with ADLs; able to feed himself. Transports to hemodialysis via handicap vaLucianne Leiriven by spouse. Height is 6'3" His current weight is 373lbs, a loss of 25 lbs over these last 2 months. Pt states he has lost about 85lbs of fluid since initiation of dialysis. At a height of 6'3" his BMI is 46.6kg/m2. He continues oxygen 3-4 LPM.               4. Family and Community  Worked as a PaAudiological scientistlast employed about 10 years ago.  Married 7 years to extremely supportive spouse, Patrick BambergPatient has a son age 3106previous marriage). Patient's M-I-L and S-I-L live in 2 separate homes, close by.     5. Follow up Palliative Care Visit: Tues 12/26/2019 @9 :30am.  I spent 60 minutes providing this consultation from 3pm to 4pm. More than 50% of the time in this consultation was spent coordinating communication.    HISTORY OF PRESENT ILLNESS:  AnSPIKE DESILETSs a [age]ear old male with history of chronic kidney disease stage V (hemodialysis), recently diagnosed calciphylaxis, chronic hypoxic respiratory failure on 3 L [place], OSA, OHS, diabetes mellitus type 2, remote DVT/PE (anticoagulation with coumadin, recently changed to Eliquis d/t calciphylaxis),  hemorrhagic R kidney cyst (CT scan; conservative management finasteride/Foley), anemia of chronic disease/acute blood loss, mood disorder/bipolar (BuSpar, Cymbalta, Lamictal; psych f/u), seizure d/o (Keppra), chronic pain syndrome, morbid obesity, debility (WC bound baseline), mild confusion (sedative medications), chronic hypotension - on midodrine 10/11-10/27/2020 hospitalized with COVID pneumonia. 11/10-11/16/2020 hospitalization 1202/6-37/85/8850TI complicated pyelonephritis/gross hematuria/inability to void/pain, acute on chronic CKD creatinine of 5.42.   12/13-07/03/2019 hospitalization. Initiated hemodialysis (06/15/2019). Tx'd 5 units PRBC. 1227/74/12 brachiocephalic arteriovenous fistula (Patrick. [place] 09/12/2019  FISTULA  SUPERFICIALIZATION ON SECOND STAGE LEFT BRACHIOCEPHALIC ARTERIOVENOUS FISTULA (thought would be ready to use in 2 wks from this date) 10/23/2019: Echocardiogram: EF 55-6-%. No RWMA. Grade II diastolic dysfunction. No pericardial effusion.   This is a f/u Palliative Care Visit from 10/24/2019.    CODE STATUS: DNR   PPS: 30%  HOSPICE ELIGIBILITY/DIAGNOSIS: yes/calciphylaxtics  PAST MEDICAL HISTORY:  Past Medical History:  Diagnosis Date  . Anemia   . Anxiety   . Arthritis   . Bipolar 1 disorder ([facility]   Denies  . Bronchitis    hx of  . Bruises easily   . Chronic pain syndrome 05/11/2012  . Chronic respiratory failure with hypoxia (HCC)    And with hypercapnia  . Constipation    chronic  . Diabetes mellitus without complication (HCEagar  . Diabetic neuropathy ([facility]  . Dialysis patient ([facility]  . DVT (deep venous thrombosis) (  HCC)    LLE DVT ~ '12  . Dyspnea    with ambulation  . ESRD (end stage renal disease) ([place])    MWF- [facility]  . GERD (gastroesophageal reflux disease)   . History of 2019 novel coronavirus disease (COVID-19)   . HOH (hard of hearing) 2015   has hearing aids  . Mental disorder   . Migraine   . Neuromuscular disorder ([place])   .  Obesity hypoventilation syndrome ([place])   . Obstructive sleep apnea    no longer  . PE (pulmonary embolism)    bilateral PE ~ '11  . Seizures ([place])   . Thrombocytopenia ([place]) 05/11/2012    SOCIAL HX:  Social History   Tobacco Use  . Smoking status: Never Smoker  . Smokeless tobacco: Former Systems developer    Types: Chew  Substance Use Topics  . Alcohol use: No    ALLERGIES:  Allergies  Allergen Reactions  . Bee Venom Anaphylaxis, Shortness Of Breath and Swelling  . Other Shortness Of Breath    Itching, rash with IVP DYE, iodine, shellfish LATEX  . Shellfish Allergy Nausea And Vomiting and Other (See Comments)    Feels like insides are twisting  . Iodinated Diagnostic Agents     Other reaction(s): RASH  . Iohexol      Code: RASH, Desc: PT WAS ON PREDNISONE FOR GOUT TX. @ TIME OF SCAN AND RECEIVED 50 MG OF BENADRYL IV-ARS 10/08/07   . Iodine Rash  . Latex Rash     PERTINENT MEDICATIONS:  Outpatient Encounter Medications as of 12/12/2019  Medication Sig  . albuterol (VENTOLIN HFA) 108 (90 Base) MCG/ACT inhaler TAKE 2 PUFFS BY MOUTH EVERY 6 HOURS AS NEEDED FOR WHEEZE OR SHORTNESS OF BREATH (Patient taking differently: Inhale 2 puffs into the lungs every 6 (six) hours as needed for wheezing or shortness of breath. )  . apixaban (ELIQUIS) 2.5 MG TABS tablet Take 1 tablet (2.5 mg total) by mouth 2 (two) times daily.  . busPIRone (BUSPAR) 15 MG tablet TAKE 1 TABLET (15 MG TOTAL) BY MOUTH 2 (TWO) TIMES DAILY.  . calcium acetate (PHOSLO) 667 MG capsule Take 2 capsules (1,334 mg total) by mouth 3 (three) times daily with meals.  . cetirizine (ZYRTEC) 10 MG tablet TAKE 1 TABLET BY MOUTH EVERY DAY  . clomiPHENE (CLOMID) 50 MG tablet Take 0.5 tablets (25 mg total) by mouth every other day.  . diphenhydrAMINE (BENADRYL) 50 MG tablet Take 50 mg 1 hour before chest ct  . DULoxetine (CYMBALTA) 60 MG capsule TAKE 1 CAPSULE BY MOUTH EVERY DAY  . EMGALITY 120 MG/ML SOAJ INJECT 120 MG INTO THE SKIN EVERY  30 (THIRTY) DAYS.  Marland Kitchen EPIPEN 2-PAK 0.3 MG/0.3ML SOAJ injection INJECT 0.3 MLS (0.3 MG TOTAL) INTO THE MUSCLE ONCE. AS NEEDED FOR ANAPHYLACTIC REACTION (Patient taking differently: Inject 0.3 mg into the muscle as needed for anaphylaxis. )  . esomeprazole (NEXIUM) 40 MG capsule TAKE 1 CAPSULE (40 MG TOTAL) BY MOUTH DAILY AT 12 NOON.  . fentaNYL (DURAGESIC) 50 MCG/HR Place 1 patch onto the skin every 3 (three) days.  . fluticasone (FLONASE) 50 MCG/ACT nasal spray PLACE 1 SPRAY INTO BOTH NOSTRILS 2 (TWO) TIMES DAILY AS NEEDED FOR ALLERGIES.  Marland Kitchen HYDROcodone-acetaminophen (NORCO) 7.5-325 MG tablet Take 1 tablet by mouth every 6 (six) hours as needed for moderate pain. (Patient taking differently: Take 1 tablet by mouth every 4 (four) hours as needed for moderate pain. )  . Insulin Glargine, 1 Unit  Dial, (TOUJEO SOLOSTAR) 300 UNIT/ML SOPN Inject 45 Units into the skin 2 (two) times daily. (Patient taking differently: Inject 50 Units into the skin 2 (two) times daily. )  . Insulin Lispro (HUMALOG KWIKPEN [place]) Inject 30 Units into the skin 3 (three) times daily.  . Insulin Pen Needle (PEN NEEDLES) 32G X 6 MM MISC 1 application by Does not apply route in the morning and at bedtime.  . lamoTRIgine (LAMICTAL) 200 MG tablet TAKE 1 TABLET BY MOUTH TWICE A DAY  . levETIRAcetam (KEPPRA) 500 MG tablet Take 1 tablet (500 mg total) by mouth 2 (two) times daily.  . midodrine (PROAMATINE) 10 MG tablet Take 10 mg by mouth 3 (three) times daily.  . Misc. Devices (MATTRESS COVER) MISC bariatric gel overlay mattress  . NARCAN 4 MG/0.1ML LIQD nasal spray kit Place 1 spray into the nose once.   . polyethylene glycol (MIRALAX / GLYCOLAX) 17 g packet Take 17 g by mouth 2 (two) times daily. (Patient taking differently: Take 17 g by mouth daily as needed for mild constipation or moderate constipation. )  . predniSONE (DELTASONE) 50 MG tablet Take 50 mg 13 hours before chest ct.Take 50 mg 7 hours before chest ct.Take 50 mg 1 hour  before chest ct.  . pregabalin (LYRICA) 50 MG capsule Take 1 capsule (50 mg total) by mouth 2 (two) times daily.  . rizatriptan (MAXALT) 10 MG tablet Take 1 tablet (10 mg total) by mouth as needed. May repeat in 2 hours if needed  . senna (SENOKOT) 8.6 MG TABS tablet Take 1 tablet (8.6 mg total) by mouth at bedtime.  . vitamin C (VITAMIN C) 500 MG tablet Take 1 tablet (500 mg total) by mouth daily.  . Vitamin D, Ergocalciferol, (DRISDOL) 1.25 MG (50000 UT) CAPS capsule Take 50,000 Units by mouth once a week. Monday  . zinc sulfate 220 (50 Zn) MG capsule Take 1 capsule (220 mg total) by mouth 2 (two) times daily.  . [DISCONTINUED] potassium chloride (K-DUR) 10 MEQ tablet Take 3 tablets (30 mEq total) by mouth daily.   No facility-administered encounter medications on file as of 12/12/2019.    General Overview:   Morbidly obese lying almost flat in electric bed with gel overlay. Oxygen via [place] in place. Spouse at bedside.  Neurological: Weakness but otherwise non-focal. A& O x 3  Julianne Handler, [name] [place]

## 2019-12-14 DIAGNOSIS — L89152 Pressure ulcer of sacral region, stage 2: Secondary | ICD-10-CM | POA: Diagnosis not present

## 2019-12-14 DIAGNOSIS — J9611 Chronic respiratory failure with hypoxia: Secondary | ICD-10-CM | POA: Diagnosis not present

## 2019-12-14 DIAGNOSIS — Z86711 Personal history of pulmonary embolism: Secondary | ICD-10-CM | POA: Diagnosis not present

## 2019-12-14 DIAGNOSIS — E1122 Type 2 diabetes mellitus with diabetic chronic kidney disease: Secondary | ICD-10-CM | POA: Diagnosis not present

## 2019-12-14 DIAGNOSIS — M6281 Muscle weakness (generalized): Secondary | ICD-10-CM | POA: Diagnosis not present

## 2019-12-14 DIAGNOSIS — N185 Chronic kidney disease, stage 5: Secondary | ICD-10-CM | POA: Diagnosis not present

## 2019-12-14 DIAGNOSIS — S31104D Unspecified open wound of abdominal wall, left lower quadrant without penetration into peritoneal cavity, subsequent encounter: Secondary | ICD-10-CM | POA: Diagnosis not present

## 2019-12-14 DIAGNOSIS — S21102D Unspecified open wound of left front wall of thorax without penetration into thoracic cavity, subsequent encounter: Secondary | ICD-10-CM | POA: Diagnosis not present

## 2019-12-14 NOTE — Telephone Encounter (Signed)
Spoke to wife she stated patient has bad sores on lower abdomen.Stated hospice has been called in.Stated he is unable to have chest ct.Advised I will make Dr.Jordan aware.

## 2019-12-15 DIAGNOSIS — E1129 Type 2 diabetes mellitus with other diabetic kidney complication: Secondary | ICD-10-CM | POA: Diagnosis not present

## 2019-12-15 DIAGNOSIS — D689 Coagulation defect, unspecified: Secondary | ICD-10-CM | POA: Diagnosis not present

## 2019-12-15 DIAGNOSIS — N186 End stage renal disease: Secondary | ICD-10-CM | POA: Diagnosis not present

## 2019-12-15 DIAGNOSIS — D509 Iron deficiency anemia, unspecified: Secondary | ICD-10-CM | POA: Diagnosis not present

## 2019-12-15 DIAGNOSIS — D631 Anemia in chronic kidney disease: Secondary | ICD-10-CM | POA: Diagnosis not present

## 2019-12-15 DIAGNOSIS — N2581 Secondary hyperparathyroidism of renal origin: Secondary | ICD-10-CM | POA: Diagnosis not present

## 2019-12-15 DIAGNOSIS — Z992 Dependence on renal dialysis: Secondary | ICD-10-CM | POA: Diagnosis not present

## 2019-12-18 DIAGNOSIS — D631 Anemia in chronic kidney disease: Secondary | ICD-10-CM | POA: Diagnosis not present

## 2019-12-18 DIAGNOSIS — N2581 Secondary hyperparathyroidism of renal origin: Secondary | ICD-10-CM | POA: Diagnosis not present

## 2019-12-18 DIAGNOSIS — Z992 Dependence on renal dialysis: Secondary | ICD-10-CM | POA: Diagnosis not present

## 2019-12-18 DIAGNOSIS — D689 Coagulation defect, unspecified: Secondary | ICD-10-CM | POA: Diagnosis not present

## 2019-12-18 DIAGNOSIS — E1129 Type 2 diabetes mellitus with other diabetic kidney complication: Secondary | ICD-10-CM | POA: Diagnosis not present

## 2019-12-18 DIAGNOSIS — N186 End stage renal disease: Secondary | ICD-10-CM | POA: Diagnosis not present

## 2019-12-18 DIAGNOSIS — D509 Iron deficiency anemia, unspecified: Secondary | ICD-10-CM | POA: Diagnosis not present

## 2019-12-19 ENCOUNTER — Telehealth: Payer: Self-pay | Admitting: Internal Medicine

## 2019-12-19 DIAGNOSIS — E1122 Type 2 diabetes mellitus with diabetic chronic kidney disease: Secondary | ICD-10-CM | POA: Diagnosis not present

## 2019-12-19 DIAGNOSIS — S21102D Unspecified open wound of left front wall of thorax without penetration into thoracic cavity, subsequent encounter: Secondary | ICD-10-CM | POA: Diagnosis not present

## 2019-12-19 DIAGNOSIS — N185 Chronic kidney disease, stage 5: Secondary | ICD-10-CM | POA: Diagnosis not present

## 2019-12-19 DIAGNOSIS — M6281 Muscle weakness (generalized): Secondary | ICD-10-CM | POA: Diagnosis not present

## 2019-12-19 DIAGNOSIS — J9611 Chronic respiratory failure with hypoxia: Secondary | ICD-10-CM | POA: Diagnosis not present

## 2019-12-19 DIAGNOSIS — Z86711 Personal history of pulmonary embolism: Secondary | ICD-10-CM | POA: Diagnosis not present

## 2019-12-19 DIAGNOSIS — S31104D Unspecified open wound of abdominal wall, left lower quadrant without penetration into peritoneal cavity, subsequent encounter: Secondary | ICD-10-CM | POA: Diagnosis not present

## 2019-12-19 NOTE — Telephone Encounter (Signed)
10am TC to spouse [name]. Patient continues fatigued. His Hgb is 6.9, and they are hoping he'll have more energy after a scheduled OP blood transfusion scheduled for tomorrow. He continues more or less adequate pain control on Fentanyl patch 43mcg, with 4-6 Hydrocodone tabs qd in divided doses. Pain is moderately severe with dressing changes.   They have a f/u office visit with the pain clinic this Thursday. I  faxed Dr. Janalyn Rouse and my notes to [facility] Meriel Flavors NP) to update.   Violeta Gelinas NP-C [phone]

## 2019-12-20 ENCOUNTER — Encounter (HOSPITAL_COMMUNITY): Payer: Medicare Other

## 2019-12-20 ENCOUNTER — Other Ambulatory Visit: Payer: Self-pay | Admitting: Family

## 2019-12-20 DIAGNOSIS — F32A Depression, unspecified: Secondary | ICD-10-CM

## 2019-12-21 DIAGNOSIS — Z79899 Other long term (current) drug therapy: Secondary | ICD-10-CM | POA: Diagnosis not present

## 2019-12-21 DIAGNOSIS — I279 Pulmonary heart disease, unspecified: Secondary | ICD-10-CM | POA: Diagnosis not present

## 2019-12-21 DIAGNOSIS — G894 Chronic pain syndrome: Secondary | ICD-10-CM | POA: Diagnosis not present

## 2019-12-21 DIAGNOSIS — L8990 Pressure ulcer of unspecified site, unspecified stage: Secondary | ICD-10-CM | POA: Diagnosis not present

## 2019-12-21 DIAGNOSIS — G8194 Hemiplegia, unspecified affecting left nondominant side: Secondary | ICD-10-CM | POA: Diagnosis not present

## 2019-12-21 DIAGNOSIS — J449 Chronic obstructive pulmonary disease, unspecified: Secondary | ICD-10-CM | POA: Diagnosis not present

## 2019-12-21 NOTE — Telephone Encounter (Signed)
Patient needs to be seen.

## 2019-12-21 NOTE — Telephone Encounter (Signed)
Please see MyChart message from today regarding this. Will close encounter.

## 2019-12-22 DIAGNOSIS — Z992 Dependence on renal dialysis: Secondary | ICD-10-CM | POA: Diagnosis not present

## 2019-12-22 DIAGNOSIS — D631 Anemia in chronic kidney disease: Secondary | ICD-10-CM | POA: Diagnosis not present

## 2019-12-22 DIAGNOSIS — N186 End stage renal disease: Secondary | ICD-10-CM | POA: Diagnosis not present

## 2019-12-22 DIAGNOSIS — E1129 Type 2 diabetes mellitus with other diabetic kidney complication: Secondary | ICD-10-CM | POA: Diagnosis not present

## 2019-12-22 DIAGNOSIS — D689 Coagulation defect, unspecified: Secondary | ICD-10-CM | POA: Diagnosis not present

## 2019-12-22 DIAGNOSIS — N2581 Secondary hyperparathyroidism of renal origin: Secondary | ICD-10-CM | POA: Diagnosis not present

## 2019-12-22 DIAGNOSIS — D509 Iron deficiency anemia, unspecified: Secondary | ICD-10-CM | POA: Diagnosis not present

## 2019-12-26 ENCOUNTER — Telehealth: Payer: Self-pay | Admitting: Family

## 2019-12-26 ENCOUNTER — Other Ambulatory Visit: Payer: Self-pay

## 2019-12-26 ENCOUNTER — Other Ambulatory Visit: Payer: Medicare Other | Admitting: Internal Medicine

## 2019-12-26 ENCOUNTER — Encounter: Payer: Self-pay | Admitting: Internal Medicine

## 2019-12-26 DIAGNOSIS — M545 Low back pain, unspecified: Secondary | ICD-10-CM

## 2019-12-26 DIAGNOSIS — Z7189 Other specified counseling: Secondary | ICD-10-CM | POA: Diagnosis not present

## 2019-12-26 DIAGNOSIS — IMO0002 Reserved for concepts with insufficient information to code with codable children: Secondary | ICD-10-CM

## 2019-12-26 DIAGNOSIS — D696 Thrombocytopenia, unspecified: Secondary | ICD-10-CM

## 2019-12-26 DIAGNOSIS — G40909 Epilepsy, unspecified, not intractable, without status epilepticus: Secondary | ICD-10-CM

## 2019-12-26 DIAGNOSIS — S31104D Unspecified open wound of abdominal wall, left lower quadrant without penetration into peritoneal cavity, subsequent encounter: Secondary | ICD-10-CM | POA: Diagnosis not present

## 2019-12-26 DIAGNOSIS — E1165 Type 2 diabetes mellitus with hyperglycemia: Secondary | ICD-10-CM

## 2019-12-26 DIAGNOSIS — G894 Chronic pain syndrome: Secondary | ICD-10-CM

## 2019-12-26 DIAGNOSIS — M6281 Muscle weakness (generalized): Secondary | ICD-10-CM | POA: Diagnosis not present

## 2019-12-26 DIAGNOSIS — Z515 Encounter for palliative care: Secondary | ICD-10-CM | POA: Diagnosis not present

## 2019-12-26 DIAGNOSIS — G8929 Other chronic pain: Secondary | ICD-10-CM

## 2019-12-26 DIAGNOSIS — E1122 Type 2 diabetes mellitus with diabetic chronic kidney disease: Secondary | ICD-10-CM | POA: Diagnosis not present

## 2019-12-26 DIAGNOSIS — N179 Acute kidney failure, unspecified: Secondary | ICD-10-CM

## 2019-12-26 DIAGNOSIS — Z86711 Personal history of pulmonary embolism: Secondary | ICD-10-CM | POA: Diagnosis not present

## 2019-12-26 DIAGNOSIS — J9611 Chronic respiratory failure with hypoxia: Secondary | ICD-10-CM | POA: Diagnosis not present

## 2019-12-26 DIAGNOSIS — N184 Chronic kidney disease, stage 4 (severe): Secondary | ICD-10-CM

## 2019-12-26 DIAGNOSIS — S21102D Unspecified open wound of left front wall of thorax without penetration into thoracic cavity, subsequent encounter: Secondary | ICD-10-CM | POA: Diagnosis not present

## 2019-12-26 DIAGNOSIS — N185 Chronic kidney disease, stage 5: Secondary | ICD-10-CM | POA: Diagnosis not present

## 2019-12-26 NOTE — Telephone Encounter (Signed)
Stanton Kidney states pt is wanting to stop dialysis and requesting a referral to hospice care. Fax# [phone].

## 2019-12-26 NOTE — Addendum Note (Signed)
Addended by: Evelina Dun A on: 12/26/2019 01:58 PM   Modules accepted: Orders

## 2019-12-26 NOTE — Telephone Encounter (Signed)
Do we need to do a written referral or just contact hospice?

## 2019-12-26 NOTE — Progress Notes (Signed)
June 29th, 2021 Community Palliative Care Consult Note Telephone: [phone] Fax: [phone]   PATIENT NAME: [name] DOB: Nov 17, 1965 MRN: [id number] [place]  [phone]   PRIMARY CARE PROVIDER:   Sharion Balloon, FNP   REFERRING PROVIDER:  Sharion Balloon, FNP [facility] [address], [place],  [place] [address] [phone]   Dialysis at [facility] in [place]    Dr Mar Daring (Urology)    Jeanella Anton NP, [facility], [place], [place] [address]. [place], [place] [facility], [phone]    [facility] for wound care (started late April 2021)    Dr. Daleen Bo  (Dermatology [facility])   Dr. Moshe Cipro (Nephrology)   RESPONSIBLE PARTY: (spouse) Kendric Sindelar (hospital x-ray tech) [phone] [email or web address].  ASSESSMENT / RECOMMENDATIONS:   1.Advance Care Planning:  A. Directives: DNR and MOST form  on wall over patient's bed. MOST details: Scope of Medical Services limited to Comfort Measures. No to Antibiotics and IVFs. No to Tube Feedings.    B. Goals of Care:   [name] has made the decision to discontinue dialysis. Spouse reports this past Friday he reported to the dialysis center were they did filtration only without drawing out fluid, d/t patient somnolence and marginal BP. Patient did not go to dialysis yesterday. He mentions he alerted the center as to his decision. He is fully cognizant that this decision will likely lead to an accelerated death, possibly months to weeks.  Patient amenable to referral for Hospice services. His wife is celebrating a birthday in a few days and patient wishes to delay formal hospice evaluation till afterwards. I'll call his PCP Dr. Trudie Reed to request referral and ask if she would continue  as attending under Hospice services.    Patients is coping very well. He admits to feeling scared when alone but otherwise participating in plans for his death including questions about what his wife [name] should do when he passes and logistics of who she should call upon his passing, etc. I reassured him that under [place], the nurse would come out and pronounce and would call the funeral home etc, rather than [name] having to call EMS.  Patient very much wishes to keep his current electric bed, and gel overlay mattress.   2. Functional/Cognitive Assessment:  Patient reports feeling foggy headed with increase difficulty with word binding. He notices his vision has worsened over baseline. Increased somnolence but more awake over the weekend. He is bed bound but able to reposition himself a little in bed.   Pain from abdominal wounds: Continues fentanyl patch 50 micrograms per hour with supplemental hydrocodone as often as Q 2 hours while awake or infrequently as 2/d. He's averaging six tabs per day.   Maintaining adequate bowel movements soft every other day well managed with Sena 1/2 tablet QHS.   Spouse reports his wounds (2/2 calciphylaxtics) L axillary and left mid abdomen are worsening an appearance; areas are beginning to drain greenish secretions.    Patient notices increased overall body swelling. His weight this past Friday was up to 185 pounds   Continues with good appetite. He's able to eat finger foods but [name] needs to assist because of upper extremity tremors make feeding very messy. His upper extremity tremors have improved a little from last week.  Marland Kitchen  3. 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  Married 7 years to extremely supportive spouse, [name]. Patient has a son age 91 (previous marriage). Patient's M-I-L and S-I-L live in 2 separate homes, close by.     4. Follow up: Anticipate Hospice referral.   I spent 60  minutes providing this consultation from 11am to noon. More than 50% of the time in this consultation was spent coordinating communication.    HISTORY OF PRESENT ILLNESS:  CARLESS SLATTEN is a 54 year old male with history of chronic kidney disease stage V (hemodialysis), recently diagnosed calciphylaxis, chronic hypoxic respiratory failure on 3 L [place], OSA, OHS, diabetes mellitus type 2, remote DVT/PE (anticoagulation with coumadin, recently changed to Eliquis d/t calciphylaxis), hemorrhagic R kidney cyst (CT scan; conservative management finasteride/Foley), anemia of chronic disease/acute blood loss, mood disorder/bipolar (BuSpar, Cymbalta, Lamictal; psych f/u), seizure d/o (Keppra), chronic pain syndrome, morbid obesity, debility (WC bound baseline), mild confusion (sedative medications), chronic hypotension - on midodrine 10/11-10/27/2020 hospitalized with COVID pneumonia. 11/10-11/16/2020 hospitalization [id number]/[id number] UTI complicated pyelonephritis/gross hematuria/inability to void/pain, acute on chronic CKD creatinine of 5.42.   12/13-07/03/2019 hospitalization. Initiated hemodialysis (06/15/2019). Tx'd 5 units PRBC. 09/62/83 L brachiocephalic arteriovenous fistula (Dr. Scot Dock). 09/12/2019  FISTULA  SUPERFICIALIZATION ON SECOND STAGE LEFT BRACHIOCEPHALIC ARTERIOVENOUS FISTULA (thought would be ready to use in 2 wks from this date) 10/23/2019: Echocardiogram: EF 55-6-%. No RWMA. Grade II diastolic dysfunction. No pericardial effusion.   This is a f/u Palliative Care Visit from 12/12/2019.    CODE STATUS: DNR   PPS: 30%   HOSPICE ELIGIBILITY/DIAGNOSIS: yes/calciphylaxtics/ESRD PAST MEDICAL HISTORY:  Past Medical History:  Diagnosis Date  . Anemia   . Anxiety   . Arthritis   . Bipolar 1 disorder ([place])    Denies  . Bronchitis    hx of  . Bruises easily   . Chronic pain syndrome 05/11/2012  . Chronic respiratory failure with hypoxia (HCC)    And with hypercapnia  . Constipation     chronic  . Diabetes mellitus without complication ([place])   . Diabetic neuropathy ([place])   . Dialysis patient ([place])   . DVT (deep venous thrombosis) (HCC)    LLE DVT ~ '12  . Dyspnea    with ambulation  . ESRD (end stage renal disease) ([place])    MWF- [facility]  . GERD (gastroesophageal reflux disease)   . History of 2019 novel coronavirus disease (COVID-19)   . HOH (hard of hearing) 2015   has hearing aids  . Mental disorder   . Migraine   . Neuromuscular disorder ([place])   . Obesity hypoventilation syndrome ([place])   . Obstructive sleep apnea    no longer  . PE (pulmonary embolism)    bilateral PE ~ '11  . Seizures ([place])   . Thrombocytopenia ([place]) 05/11/2012    SOCIAL HX:  Social History   Tobacco Use  . Smoking status: Never Smoker  . Smokeless tobacco: Former Systems developer    Types: Chew  Substance Use Topics  . Alcohol use: No    ALLERGIES:  Allergies  Allergen Reactions  . Bee Venom Anaphylaxis, Shortness Of Breath and Swelling  . Other Shortness Of Breath    Itching, rash with IVP DYE, iodine, shellfish LATEX  . Shellfish Allergy Nausea And Vomiting and Other (See Comments)    Feels like insides are twisting  . Iodinated Diagnostic Agents     Other reaction(s): RASH  . Iohexol  Code: RASH, Desc: PT WAS ON PREDNISONE FOR GOUT TX. @ TIME OF SCAN AND RECEIVED 50 MG OF BENADRYL IV-ARS 10/08/07   . Iodine Rash  . Latex Rash     PERTINENT MEDICATIONS:  Outpatient Encounter Medications as of 12/26/2019  Medication Sig  . albuterol (VENTOLIN HFA) 108 (90 Base) MCG/ACT inhaler TAKE 2 PUFFS BY MOUTH EVERY 6 HOURS AS NEEDED FOR WHEEZE OR SHORTNESS OF BREATH (Patient taking differently: Inhale 2 puffs into the lungs every 6 (six) hours as needed for wheezing or shortness of breath. )  . apixaban (ELIQUIS) 2.5 MG TABS tablet Take 1 tablet (2.5 mg total) by mouth 2 (two) times daily. (Patient taking differently: Take 2.5 mg by mouth daily. )  . busPIRone (BUSPAR) 15 MG tablet TAKE  1 TABLET (15 MG TOTAL) BY MOUTH 2 (TWO) TIMES DAILY.  . cetirizine (ZYRTEC) 10 MG tablet TAKE 1 TABLET BY MOUTH EVERY DAY  . DULoxetine (CYMBALTA) 60 MG capsule TAKE 1 CAPSULE BY MOUTH EVERY DAY  . EMGALITY 120 MG/ML SOAJ INJECT 120 MG INTO THE SKIN EVERY 30 (THIRTY) DAYS.  Marland Kitchen esomeprazole (NEXIUM) 40 MG capsule TAKE 1 CAPSULE (40 MG TOTAL) BY MOUTH DAILY AT 12 NOON.  . fentaNYL (DURAGESIC) 50 MCG/HR Place 1 patch onto the skin every 3 (three) days.  Marland Kitchen HYDROcodone-acetaminophen (NORCO) 7.5-325 MG tablet Take 1 tablet by mouth every 6 (six) hours as needed for moderate pain. (Patient taking differently: Take 1 tablet by mouth every 4 (four) hours as needed for moderate pain. )  . Insulin Glargine, 1 Unit Dial, (TOUJEO SOLOSTAR) 300 UNIT/ML SOPN Inject 45 Units into the skin 2 (two) times daily. (Patient taking differently: Inject 50 Units into the skin 2 (two) times daily. Spouse titrates to blood sugar)  . Insulin Pen Needle (PEN NEEDLES) 32G X 6 MM MISC 1 application by Does not apply route in the morning and at bedtime.  . lamoTRIgine (LAMICTAL) 200 MG tablet TAKE 1 TABLET BY MOUTH TWICE A DAY  . levETIRAcetam (KEPPRA) 500 MG tablet Take 1 tablet (500 mg total) by mouth 2 (two) times daily.  . midodrine (PROAMATINE) 10 MG tablet Take 10 mg by mouth 3 (three) times daily.  . Misc. Devices (MATTRESS COVER) MISC bariatric gel overlay mattress  . pregabalin (LYRICA) 50 MG capsule Take 1 capsule (50 mg total) by mouth 2 (two) times daily.  . rizatriptan (MAXALT) 10 MG tablet Take 1 tablet (10 mg total) by mouth as needed. May repeat in 2 hours if needed  . senna (SENOKOT) 8.6 MG TABS tablet Take 1 tablet (8.6 mg total) by mouth at bedtime.  . vitamin C (VITAMIN C) 500 MG tablet Take 1 tablet (500 mg total) by mouth daily.  Marland Kitchen zinc sulfate 220 (50 Zn) MG capsule Take 1 capsule (220 mg total) by mouth 2 (two) times daily.  . clomiPHENE (CLOMID) 50 MG tablet Take 0.5 tablets (25 mg total) by mouth every  other day. (Patient not taking: Reported on 12/26/2019)  . diphenhydrAMINE (BENADRYL) 50 MG tablet Take 50 mg 1 hour before chest ct (Patient not taking: Reported on 12/26/2019)  . EPIPEN 2-PAK 0.3 MG/0.3ML SOAJ injection INJECT 0.3 MLS (0.3 MG TOTAL) INTO THE MUSCLE ONCE. AS NEEDED FOR ANAPHYLACTIC REACTION (Patient not taking: Reported on 12/26/2019)  . fluticasone (FLONASE) 50 MCG/ACT nasal spray PLACE 1 SPRAY INTO BOTH NOSTRILS 2 (TWO) TIMES DAILY AS NEEDED FOR ALLERGIES. (Patient not taking: Reported on 12/26/2019)  . Insulin Lispro (HUMALOG KWIKPEN [place]) Inject  30 Units into the skin 3 (three) times daily. (Patient not taking: Reported on 12/26/2019)  . NARCAN 4 MG/0.1ML LIQD nasal spray kit Place 1 spray into the nose once.   . polyethylene glycol (MIRALAX / GLYCOLAX) 17 g packet Take 17 g by mouth 2 (two) times daily. (Patient taking differently: Take 17 g by mouth daily as needed for mild constipation or moderate constipation. )  . predniSONE (DELTASONE) 50 MG tablet Take 50 mg 13 hours before chest ct.Take 50 mg 7 hours before chest ct.Take 50 mg 1 hour before chest ct. (Patient not taking: Reported on 12/26/2019)  . Vitamin D, Ergocalciferol, (DRISDOL) 1.25 MG (50000 UT) CAPS capsule Take 50,000 Units by mouth once a week. Monday (Patient not taking: Reported on 12/26/2019)  . [DISCONTINUED] calcium acetate (PHOSLO) 667 MG capsule Take 2 capsules (1,334 mg total) by mouth 3 (three) times daily with meals.  . [DISCONTINUED] potassium chloride (K-DUR) 10 MEQ tablet Take 3 tablets (30 mEq total) by mouth daily.   No facility-administered encounter medications on file as of 12/26/2019.    General: Obese laying supine in electric hospital bed. Conversant and oriented. Sleepy. NAD.  Spouse [name] in attendance.   Julianne Handler, NP

## 2019-12-26 NOTE — Telephone Encounter (Signed)
Hospice referral placed.

## 2019-12-27 DIAGNOSIS — Z992 Dependence on renal dialysis: Secondary | ICD-10-CM | POA: Diagnosis not present

## 2019-12-27 DIAGNOSIS — N186 End stage renal disease: Secondary | ICD-10-CM | POA: Diagnosis not present

## 2019-12-27 DIAGNOSIS — E1122 Type 2 diabetes mellitus with diabetic chronic kidney disease: Secondary | ICD-10-CM | POA: Diagnosis not present

## 2019-12-27 DIAGNOSIS — N12 Tubulo-interstitial nephritis, not specified as acute or chronic: Secondary | ICD-10-CM | POA: Diagnosis not present

## 2019-12-27 DIAGNOSIS — J9611 Chronic respiratory failure with hypoxia: Secondary | ICD-10-CM | POA: Diagnosis not present

## 2020-01-04 ENCOUNTER — Encounter: Payer: Self-pay | Admitting: *Deleted

## 2020-01-09 ENCOUNTER — Other Ambulatory Visit: Payer: Self-pay | Admitting: Family

## 2020-01-23 ENCOUNTER — Ambulatory Visit: Payer: Medicare Other | Admitting: Family

## 2020-01-28 DEATH — deceased

## 2020-01-29 ENCOUNTER — Telehealth: Payer: Self-pay | Admitting: Family

## 2020-02-15 ENCOUNTER — Ambulatory Visit: Payer: Medicare Other | Admitting: Family

## 2021-01-07 IMAGING — CR DG CHEST 1V PORT
2 series · 2 of 2 positions shown · non-contrast
Comparison: PA and lateral chest 01/26/2019.  CT chest 12/28/2018.

CLINICAL DATA: Onset respiratory distress today. [id number] exposure.

EXAM:
PORTABLE CHEST 1 VIEW

[portable (1 of 2)]
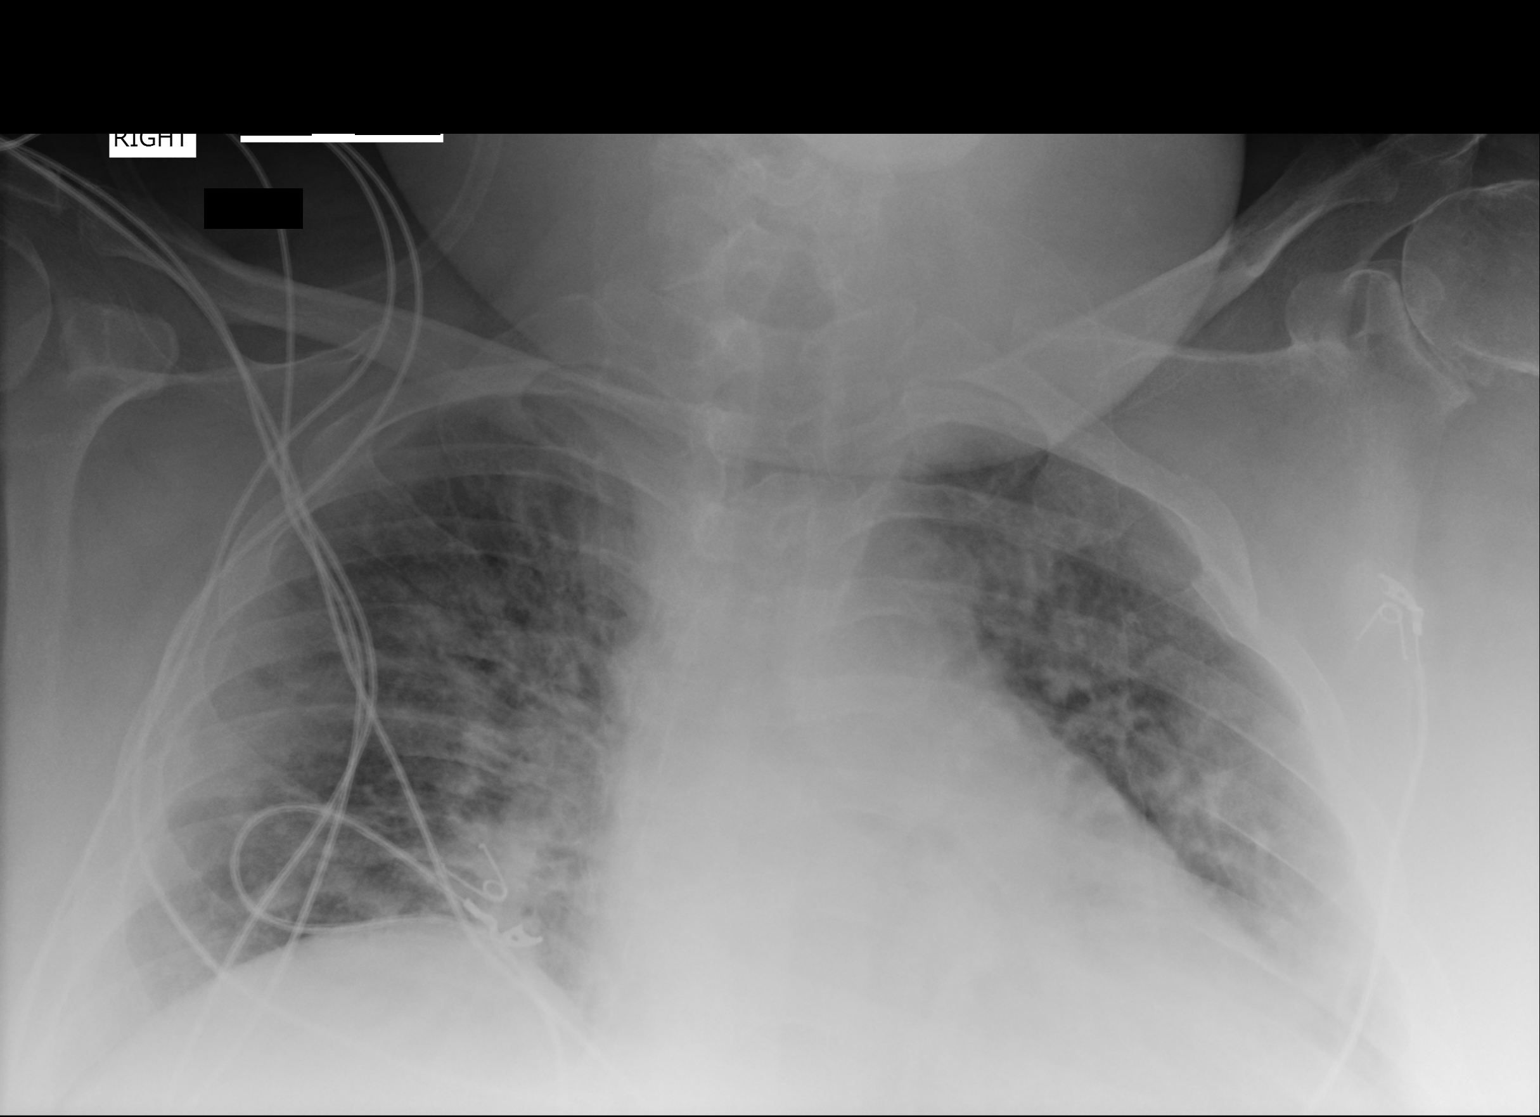

[portable (2 of 2)]
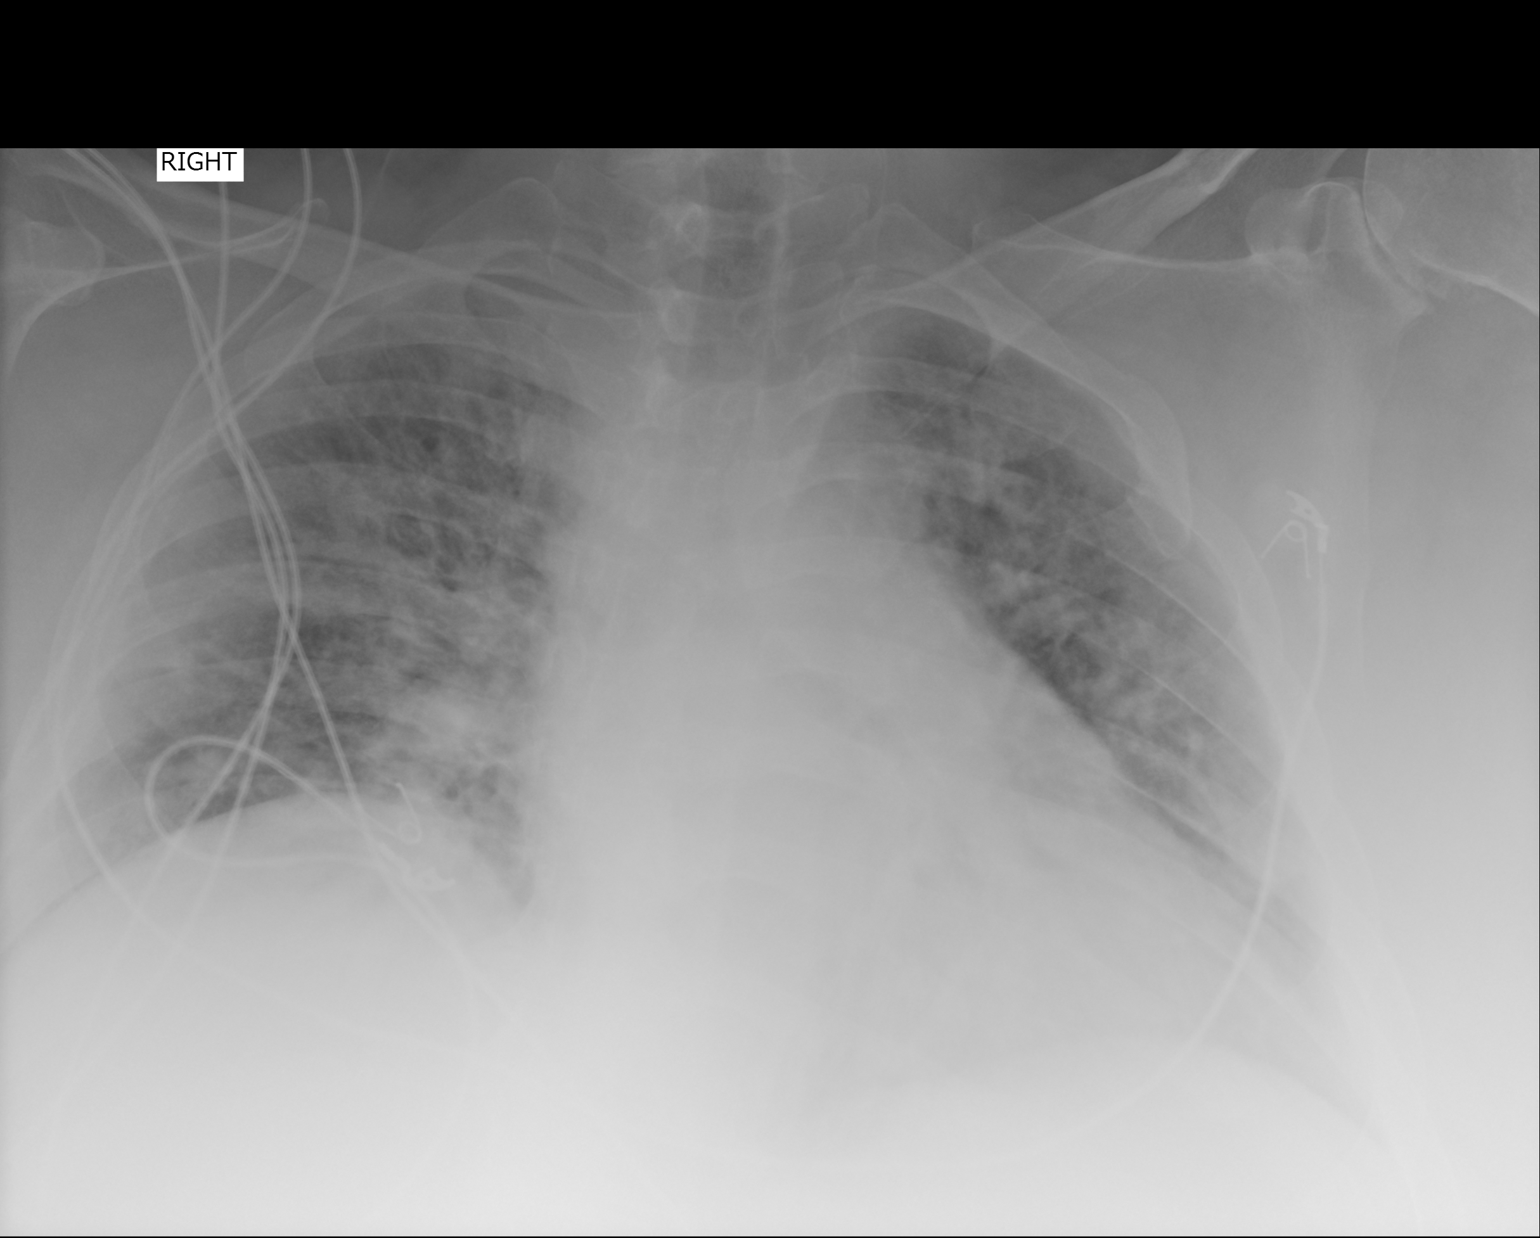

[2 of 2 positions shown; findings below may reference images not displayed]

FINDINGS: Lung volumes are low. There is patchy bilateral airspace disease.
Heart size is normal. No pneumothorax or pleural effusion.
IMPRESSION: Patchy bilateral airspace disease worrisome for pneumonia including
atypical/viral infection.

## 2021-03-11 IMAGING — CT CT RENAL STONE PROTOCOL
3 of 5 series · 15 of 46 positions shown, 17 images · non-contrast
Comparison: 06/04/2019

CLINICAL DATA: Fall yesterday, rt hip pain. Pt recently discharged
from [HOSPITAL] Multiple UTI since then after foley removed. Here
for inability to void, as well as burning upon urination. History of
DM, Migraine, seizure, CKD, DVT, PE. Pt unable to hold arms up for
abd images due to prior shoulder surgery.

EXAM:
CT ABDOMEN AND PELVIS WITHOUT CONTRAST
TECHNIQUE: Multidetector CT imaging of the abdomen and pelvis was performed
following the standard protocol without IV contrast.

[Series 2: axial st · axial · 0.93mm/px · z∈[+595,+1085]mm · 9 of 124 slices shown, 11 images (1 of 2)]
[im 13/124  soft-tissue]
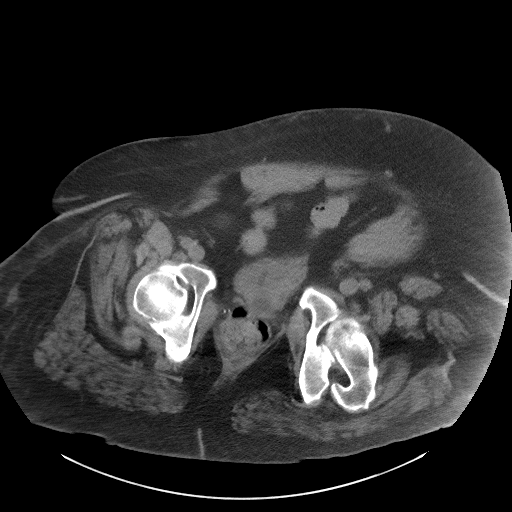
[im 13/124  bone]
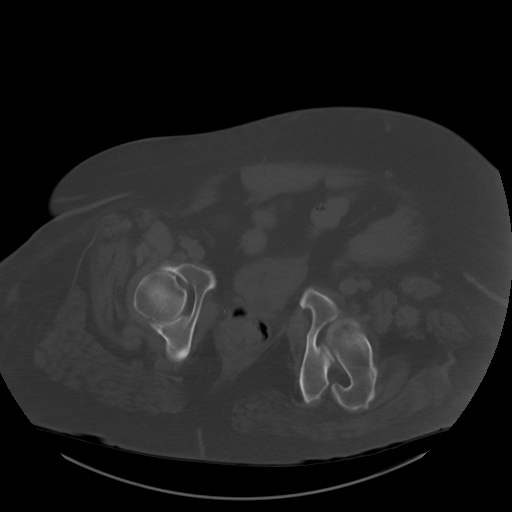
[im 25/124  soft-tissue]
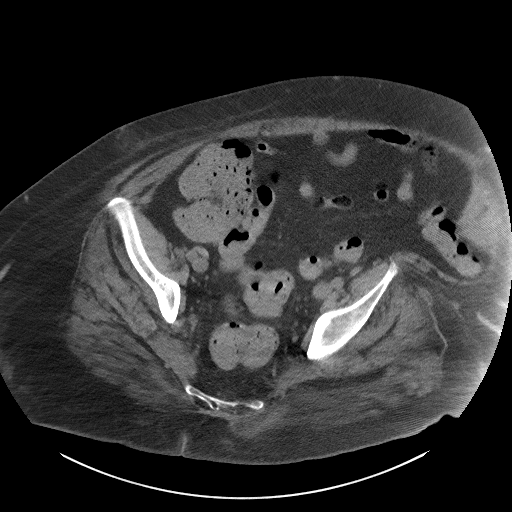
[im 37/124  soft-tissue]
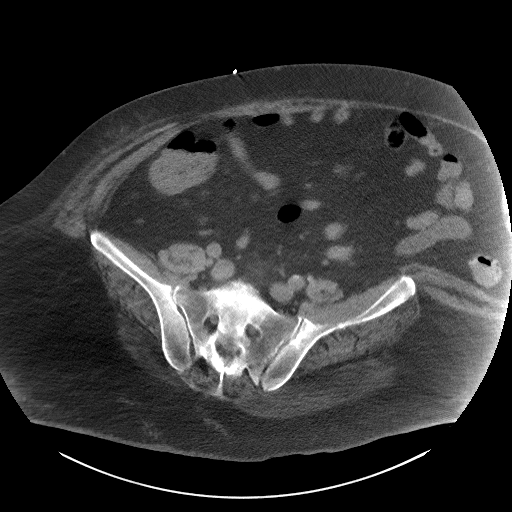
[im 50/124  soft-tissue]
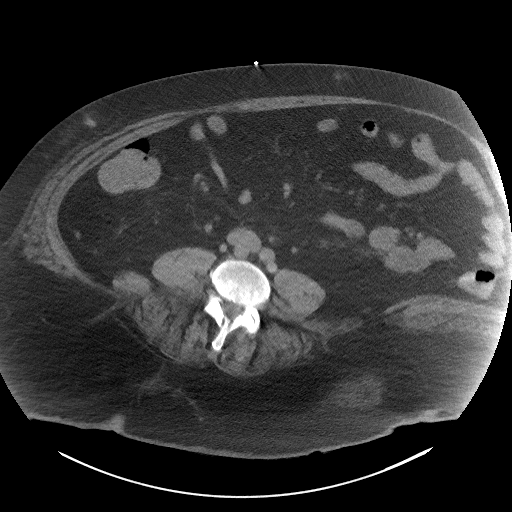
[im 62/124  soft-tissue]
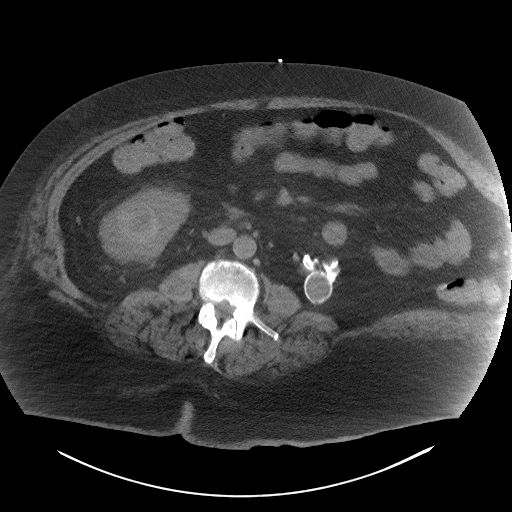
[im 74/124  soft-tissue]
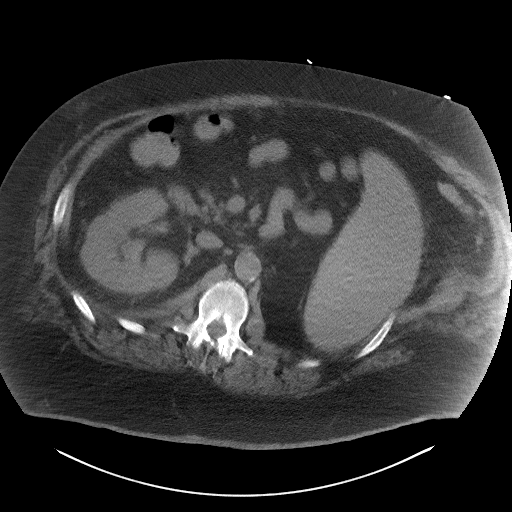
[im 87/124  soft-tissue]
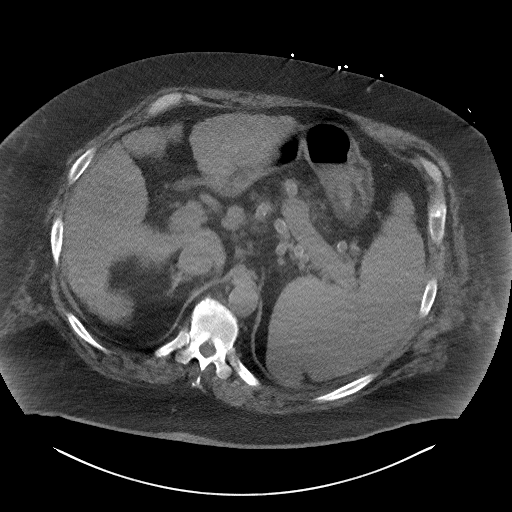
[im 99/124  soft-tissue]
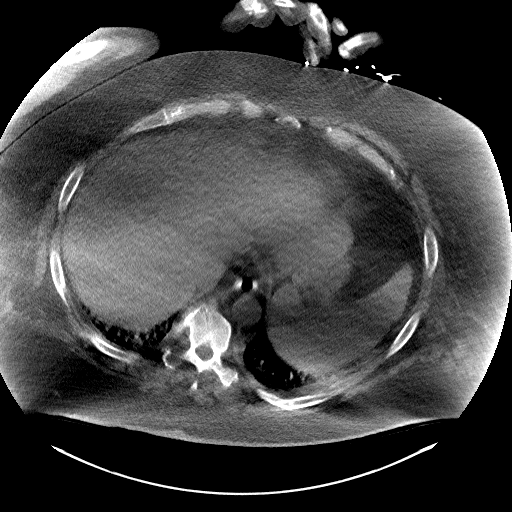
[im 111/124  soft-tissue]
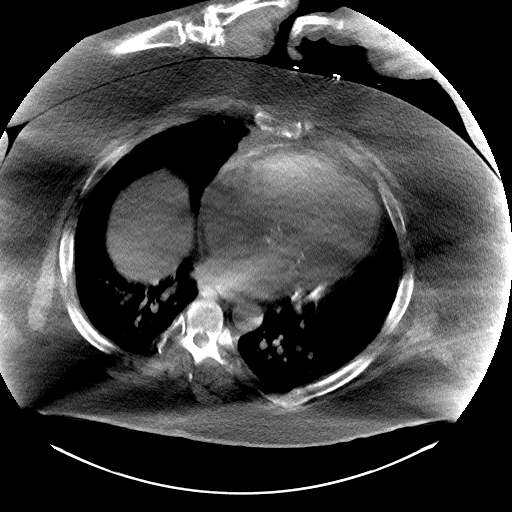
[im 111/124  bone]
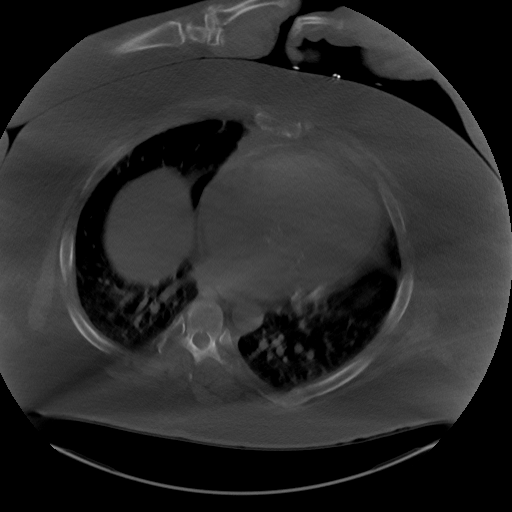

[Series 3: axial st · axial · 1.27mm/px · z∈[+595,+715]mm · 3 of 124 slices shown (2 of 2)]
[im 13/124  soft-tissue]
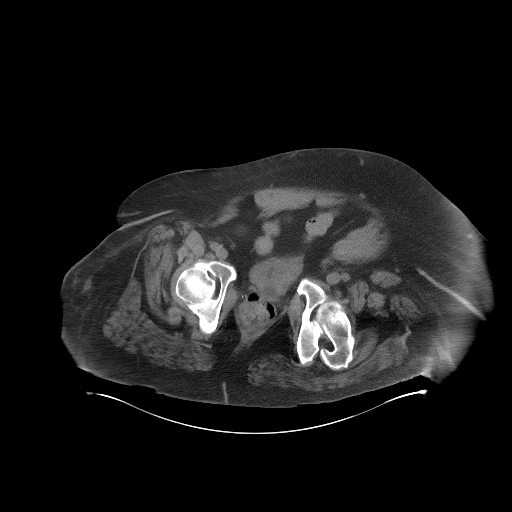
[im 25/124  soft-tissue]
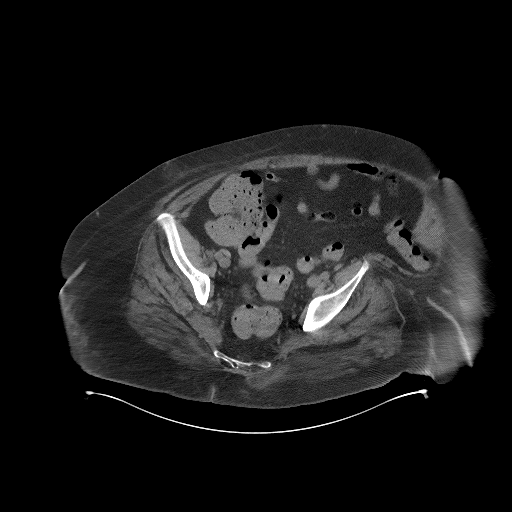
[im 37/124  soft-tissue]
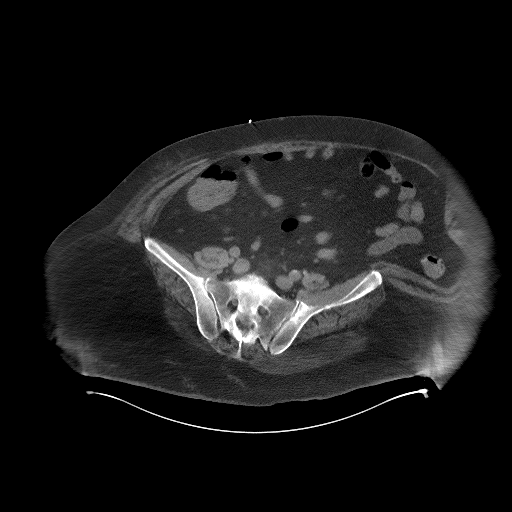

[Series 6: coronal st · coronal · 1.04mm/px · 3 of 138 slices shown]
[im 46/138  soft-tissue]
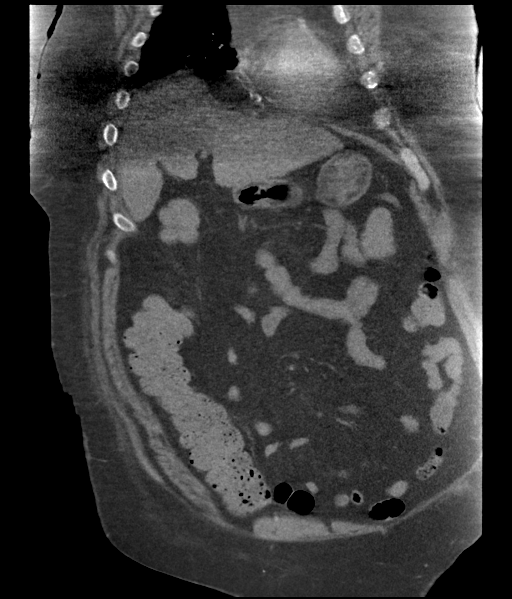
[im 61/138  soft-tissue]
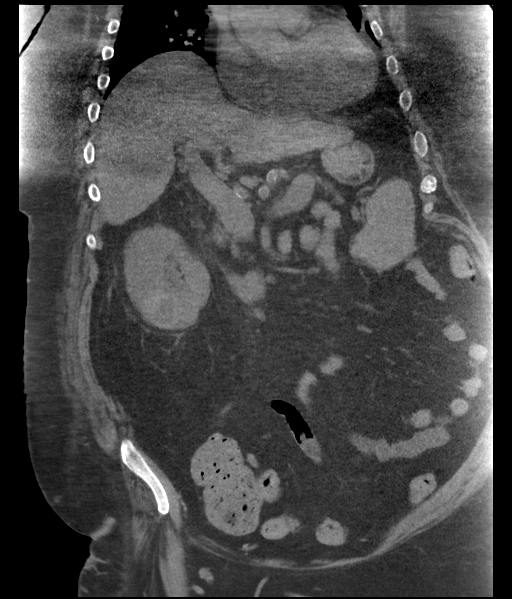
[im 77/138  soft-tissue]
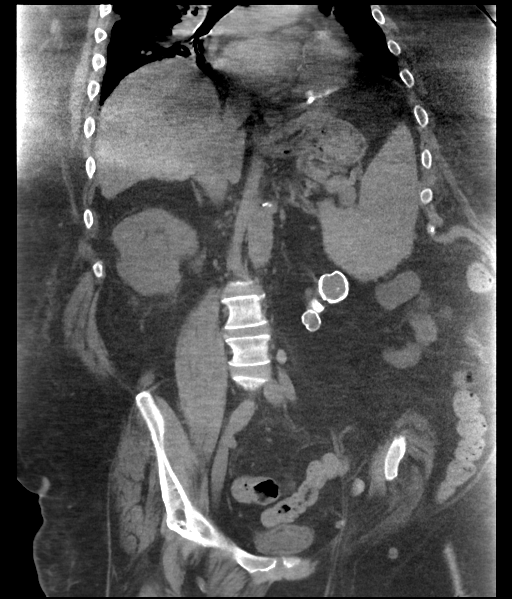

[15 of 46 positions shown; findings below may reference images not displayed]

FINDINGS: Lower chest: There mild hazy opacities at the lung bases as well as
linear areas of presumed atelectasis or scarring, similar to the
prior CT allowing artifact from overlying arms on the current exam.
No new lung base abnormalities.

Hepatobiliary: Liver demonstrates central volume loss and relative
enlargement of the lateral segment left lobe, findings suggesting
cirrhosis. No liver mass or focal lesion. Status post
cholecystectomy. No bile duct dilation

Pancreas: Significant fatty replacement. No defined mass or
inflammation.

Spleen: Enlarged, 20 x 7.8 x 21 cm. This is stable from recent exam.
No splenic mass or focal lesion.

Adrenals/Urinary Tract: No adrenal mass.

Small peripherally calcified cysts reflecting residual left kidney,
unchanged.

Right kidney shows mild dilation of the intrarenal collecting
system. Collecting system contents demonstrate increased attenuation
similar to prior CT. There is a mass in the lower pole with
peripheral increased attenuation. Mass measures 4.7 x 3.4 x 3.2 cm.
The mass corresponds to a complex cyst noted on ultrasound dated
05/11/2019. Findings are consistent hemorrhage into the cyst, which
is communicating with the collecting system.

No other evidence of mass. No stones. Right ureters normal in course
and in caliber. No stones.

Bladder is decompressed with a Foley catheter

Stomach/Bowel: Stomach is unremarkable. Small bowel and colon are
normal in caliber. No wall thickening. No inflammation. Appendix not
visualized. No evidence of appendicitis.

Vascular/Lymphatic: Mild aortic atherosclerotic calcifications.
Enlarged splenic and portal veins. Prominent to mildly enlarged
lymph nodes adjacent to the portal vein, largest measuring 1.4 cm in
short axis. No other enlarged lymph nodes.

Reproductive: Unremarkable.

Other: No ascites.  No abdominal wall hernia.

Musculoskeletal: No fracture or acute finding. No osteoblastic or
osteolytic lesions.
IMPRESSION: 1. Hemorrhage into a right renal lower pole cysts, or dilated calyx,
which is communicating with the right intrarenal collecting system.
Right renal collecting system is mildly dilated. There is no
ureteral dilation, however, no renal or ureteral stones. This
appearance is similar to the recent prior CT.
2. Hazy opacities at the lung bases consistent residual [id number]
infection, also stable from the recent prior abdomen and pelvis CT.
3. Findings consistent cirrhosis with portal venous hypertension
reflected by splenomegaly and enlargement of the splenic and portal
veins.
4. Marked chronic atrophy of the left kidney with residual
peripherally calcified cysts, stable.
5. Mild aortic atherosclerosis.
6. No fracture of the visualized skeletal structures, which includes
the right proximal femur to the intertrochanteric region.
